# Patient Record
Sex: Female | Born: 1950
Health system: Southern US, Community
[De-identification: ages and names within clinical notes are randomized; demographics above are authoritative.]

## PROBLEM LIST (undated history)

## (undated) DIAGNOSIS — I509 Heart failure, unspecified: Secondary | ICD-10-CM

## (undated) DIAGNOSIS — H409 Unspecified glaucoma: Secondary | ICD-10-CM

## (undated) DIAGNOSIS — Z5189 Encounter for other specified aftercare: Secondary | ICD-10-CM

## (undated) DIAGNOSIS — D649 Anemia, unspecified: Secondary | ICD-10-CM

## (undated) DIAGNOSIS — E669 Obesity, unspecified: Secondary | ICD-10-CM

## (undated) DIAGNOSIS — R75 Inconclusive laboratory evidence of human immunodeficiency virus [HIV]: Secondary | ICD-10-CM

## (undated) DIAGNOSIS — I519 Heart disease, unspecified: Secondary | ICD-10-CM

## (undated) DIAGNOSIS — F329 Major depressive disorder, single episode, unspecified: Secondary | ICD-10-CM

## (undated) DIAGNOSIS — E785 Hyperlipidemia, unspecified: Secondary | ICD-10-CM

## (undated) DIAGNOSIS — K760 Fatty (change of) liver, not elsewhere classified: Secondary | ICD-10-CM

## (undated) DIAGNOSIS — F32A Depression, unspecified: Secondary | ICD-10-CM

## (undated) DIAGNOSIS — IMO0001 Reserved for inherently not codable concepts without codable children: Secondary | ICD-10-CM

## (undated) DIAGNOSIS — C50919 Malignant neoplasm of unspecified site of unspecified female breast: Secondary | ICD-10-CM

## (undated) DIAGNOSIS — K219 Gastro-esophageal reflux disease without esophagitis: Secondary | ICD-10-CM

## (undated) DIAGNOSIS — M1990 Unspecified osteoarthritis, unspecified site: Secondary | ICD-10-CM

## (undated) DIAGNOSIS — IMO0002 Reserved for concepts with insufficient information to code with codable children: Secondary | ICD-10-CM

## (undated) DIAGNOSIS — M797 Fibromyalgia: Secondary | ICD-10-CM

## (undated) DIAGNOSIS — Z87442 Personal history of urinary calculi: Secondary | ICD-10-CM

## (undated) DIAGNOSIS — I10 Essential (primary) hypertension: Secondary | ICD-10-CM

## (undated) DIAGNOSIS — L309 Dermatitis, unspecified: Secondary | ICD-10-CM

## (undated) DIAGNOSIS — Z923 Personal history of irradiation: Secondary | ICD-10-CM

## (undated) DIAGNOSIS — C50511 Malignant neoplasm of lower-outer quadrant of right female breast: Principal | ICD-10-CM

## (undated) HISTORY — DX: Dermatitis, unspecified: L30.9

## (undated) HISTORY — DX: Essential (primary) hypertension: I10

## (undated) HISTORY — DX: Malignant neoplasm of lower-outer quadrant of right female breast: C50.511

## (undated) HISTORY — DX: Gastro-esophageal reflux disease without esophagitis: K21.9

## (undated) HISTORY — DX: Unspecified glaucoma: H40.9

## (undated) HISTORY — DX: Personal history of urinary calculi: Z87.442

## (undated) HISTORY — DX: Unspecified osteoarthritis, unspecified site: M19.90

## (undated) HISTORY — DX: Encounter for other specified aftercare: Z51.89

## (undated) HISTORY — PX: JOINT REPLACEMENT: SHX530

## (undated) HISTORY — DX: Hyperlipidemia, unspecified: E78.5

## (undated) HISTORY — DX: Fibromyalgia: M79.7

## (undated) HISTORY — DX: Depression, unspecified: F32.A

## (undated) HISTORY — PX: CYSTOSCOPY W/ LITHOLAPAXY / EHL: SUR377

## (undated) HISTORY — DX: Heart disease, unspecified: I51.9

## (undated) HISTORY — DX: Major depressive disorder, single episode, unspecified: F32.9

## (undated) HISTORY — PX: REPLACEMENT TOTAL KNEE BILATERAL: SUR1225

---

## 1998-04-17 ENCOUNTER — Encounter: Admission: RE | Admit: 1998-04-17 | Discharge: 1998-04-17 | Payer: Self-pay | Admitting: Family Medicine

## 1998-05-01 ENCOUNTER — Encounter: Admission: RE | Admit: 1998-05-01 | Discharge: 1998-05-01 | Payer: Self-pay | Admitting: Family Medicine

## 1998-05-22 ENCOUNTER — Encounter: Admission: RE | Admit: 1998-05-22 | Discharge: 1998-05-22 | Payer: Self-pay | Admitting: Family Medicine

## 1999-09-17 ENCOUNTER — Encounter: Admission: RE | Admit: 1999-09-17 | Discharge: 1999-09-17 | Payer: Self-pay | Admitting: Family Medicine

## 1999-09-24 ENCOUNTER — Encounter: Admission: RE | Admit: 1999-09-24 | Discharge: 1999-09-24 | Payer: Self-pay | Admitting: Family Medicine

## 1999-10-19 ENCOUNTER — Encounter: Admission: RE | Admit: 1999-10-19 | Discharge: 1999-10-19 | Payer: Self-pay | Admitting: Family Medicine

## 1999-11-19 ENCOUNTER — Encounter: Admission: RE | Admit: 1999-11-19 | Discharge: 1999-11-19 | Payer: Self-pay | Admitting: Family Medicine

## 2000-05-25 ENCOUNTER — Encounter: Admission: RE | Admit: 2000-05-25 | Discharge: 2000-05-25 | Payer: Self-pay | Admitting: Family Medicine

## 2000-05-27 ENCOUNTER — Encounter: Admission: RE | Admit: 2000-05-27 | Discharge: 2000-05-27 | Payer: Self-pay | Admitting: Family Medicine

## 2000-06-02 ENCOUNTER — Encounter: Admission: RE | Admit: 2000-06-02 | Discharge: 2000-06-02 | Payer: Self-pay | Admitting: Family Medicine

## 2000-07-12 ENCOUNTER — Encounter: Admission: RE | Admit: 2000-07-12 | Discharge: 2000-07-12 | Payer: Self-pay | Admitting: Family Medicine

## 2000-07-12 ENCOUNTER — Ambulatory Visit (HOSPITAL_COMMUNITY): Admission: RE | Admit: 2000-07-12 | Discharge: 2000-07-12 | Payer: Self-pay | Admitting: Family Medicine

## 2001-01-10 ENCOUNTER — Encounter: Admission: RE | Admit: 2001-01-10 | Discharge: 2001-01-10 | Payer: Self-pay | Admitting: Family Medicine

## 2001-01-16 ENCOUNTER — Encounter: Admission: RE | Admit: 2001-01-16 | Discharge: 2001-01-16 | Payer: Self-pay | Admitting: Family Medicine

## 2001-01-16 ENCOUNTER — Encounter: Payer: Self-pay | Admitting: Family Medicine

## 2001-01-19 ENCOUNTER — Encounter: Admission: RE | Admit: 2001-01-19 | Discharge: 2001-01-19 | Payer: Self-pay | Admitting: Family Medicine

## 2001-01-19 ENCOUNTER — Encounter: Payer: Self-pay | Admitting: Family Medicine

## 2001-02-02 ENCOUNTER — Encounter: Admission: RE | Admit: 2001-02-02 | Discharge: 2001-02-02 | Payer: Self-pay | Admitting: Family Medicine

## 2001-03-16 ENCOUNTER — Encounter: Admission: RE | Admit: 2001-03-16 | Discharge: 2001-03-16 | Payer: Self-pay | Admitting: Family Medicine

## 2001-03-29 ENCOUNTER — Ambulatory Visit (HOSPITAL_COMMUNITY): Admission: RE | Admit: 2001-03-29 | Discharge: 2001-03-29 | Payer: Self-pay | Admitting: *Deleted

## 2001-04-06 ENCOUNTER — Encounter: Admission: RE | Admit: 2001-04-06 | Discharge: 2001-04-06 | Payer: Self-pay | Admitting: Family Medicine

## 2001-05-18 ENCOUNTER — Encounter: Admission: RE | Admit: 2001-05-18 | Discharge: 2001-05-18 | Payer: Self-pay | Admitting: Family Medicine

## 2001-07-06 ENCOUNTER — Encounter: Admission: RE | Admit: 2001-07-06 | Discharge: 2001-07-06 | Payer: Self-pay | Admitting: Family Medicine

## 2001-07-06 ENCOUNTER — Encounter: Payer: Self-pay | Admitting: Family Medicine

## 2001-08-29 ENCOUNTER — Encounter: Admission: RE | Admit: 2001-08-29 | Discharge: 2001-08-29 | Payer: Self-pay | Admitting: Family Medicine

## 2001-09-20 ENCOUNTER — Inpatient Hospital Stay (HOSPITAL_COMMUNITY): Admission: EM | Admit: 2001-09-20 | Discharge: 2001-09-21 | Payer: Self-pay | Admitting: *Deleted

## 2001-11-29 HISTORY — PX: CHOLECYSTECTOMY: SHX55

## 2002-01-25 ENCOUNTER — Encounter: Admission: RE | Admit: 2002-01-25 | Discharge: 2002-01-25 | Payer: Self-pay | Admitting: Family Medicine

## 2002-02-20 ENCOUNTER — Ambulatory Visit (HOSPITAL_COMMUNITY): Admission: RE | Admit: 2002-02-20 | Discharge: 2002-02-20 | Payer: Self-pay | Admitting: Sports Medicine

## 2002-02-20 ENCOUNTER — Encounter: Admission: RE | Admit: 2002-02-20 | Discharge: 2002-02-20 | Payer: Self-pay | Admitting: Sports Medicine

## 2002-03-09 ENCOUNTER — Ambulatory Visit (HOSPITAL_COMMUNITY): Admission: RE | Admit: 2002-03-09 | Discharge: 2002-03-09 | Payer: Self-pay | Admitting: Sports Medicine

## 2002-03-13 ENCOUNTER — Encounter: Admission: RE | Admit: 2002-03-13 | Discharge: 2002-03-13 | Payer: Self-pay | Admitting: Family Medicine

## 2002-05-08 ENCOUNTER — Encounter: Admission: RE | Admit: 2002-05-08 | Discharge: 2002-05-08 | Payer: Self-pay | Admitting: Sports Medicine

## 2002-05-08 ENCOUNTER — Encounter: Admission: RE | Admit: 2002-05-08 | Discharge: 2002-05-08 | Payer: Self-pay | Admitting: Family Medicine

## 2002-05-08 ENCOUNTER — Encounter: Payer: Self-pay | Admitting: Sports Medicine

## 2002-05-09 ENCOUNTER — Encounter (INDEPENDENT_AMBULATORY_CARE_PROVIDER_SITE_OTHER): Payer: Self-pay | Admitting: Specialist

## 2002-05-09 ENCOUNTER — Encounter: Payer: Self-pay | Admitting: General Surgery

## 2002-05-10 ENCOUNTER — Inpatient Hospital Stay (HOSPITAL_COMMUNITY): Admission: EM | Admit: 2002-05-10 | Discharge: 2002-05-11 | Payer: Self-pay | Admitting: Emergency Medicine

## 2002-06-05 ENCOUNTER — Emergency Department (HOSPITAL_COMMUNITY): Admission: EM | Admit: 2002-06-05 | Discharge: 2002-06-06 | Payer: Self-pay | Admitting: Emergency Medicine

## 2002-06-20 ENCOUNTER — Emergency Department (HOSPITAL_COMMUNITY): Admission: EM | Admit: 2002-06-20 | Discharge: 2002-06-20 | Payer: Self-pay

## 2002-06-26 ENCOUNTER — Encounter: Admission: RE | Admit: 2002-06-26 | Discharge: 2002-06-26 | Payer: Self-pay | Admitting: Family Medicine

## 2002-06-27 ENCOUNTER — Encounter: Payer: Self-pay | Admitting: Family Medicine

## 2002-06-27 ENCOUNTER — Encounter: Admission: RE | Admit: 2002-06-27 | Discharge: 2002-06-27 | Payer: Self-pay | Admitting: Family Medicine

## 2002-08-05 ENCOUNTER — Emergency Department (HOSPITAL_COMMUNITY): Admission: EM | Admit: 2002-08-05 | Discharge: 2002-08-05 | Payer: Self-pay | Admitting: Emergency Medicine

## 2002-08-05 ENCOUNTER — Encounter: Payer: Self-pay | Admitting: Emergency Medicine

## 2002-09-13 ENCOUNTER — Encounter: Admission: RE | Admit: 2002-09-13 | Discharge: 2002-09-13 | Payer: Self-pay | Admitting: Family Medicine

## 2002-09-17 ENCOUNTER — Ambulatory Visit (HOSPITAL_COMMUNITY): Admission: RE | Admit: 2002-09-17 | Discharge: 2002-09-17 | Payer: Self-pay | Admitting: Family Medicine

## 2002-10-01 ENCOUNTER — Encounter: Admission: RE | Admit: 2002-10-01 | Discharge: 2002-10-01 | Payer: Self-pay | Admitting: Family Medicine

## 2002-10-16 ENCOUNTER — Encounter: Admission: RE | Admit: 2002-10-16 | Discharge: 2002-10-16 | Payer: Self-pay | Admitting: Sports Medicine

## 2003-04-02 ENCOUNTER — Encounter: Admission: RE | Admit: 2003-04-02 | Discharge: 2003-04-02 | Payer: Self-pay | Admitting: Family Medicine

## 2003-07-23 ENCOUNTER — Encounter: Admission: RE | Admit: 2003-07-23 | Discharge: 2003-07-23 | Payer: Self-pay | Admitting: Family Medicine

## 2003-08-19 ENCOUNTER — Encounter: Payer: Self-pay | Admitting: Sports Medicine

## 2003-08-19 ENCOUNTER — Encounter: Admission: RE | Admit: 2003-08-19 | Discharge: 2003-08-19 | Payer: Self-pay | Admitting: Sports Medicine

## 2003-09-05 ENCOUNTER — Encounter: Admission: RE | Admit: 2003-09-05 | Discharge: 2003-09-05 | Payer: Self-pay | Admitting: Family Medicine

## 2004-01-07 ENCOUNTER — Encounter: Admission: RE | Admit: 2004-01-07 | Discharge: 2004-01-07 | Payer: Self-pay | Admitting: Family Medicine

## 2004-01-10 ENCOUNTER — Encounter: Admission: RE | Admit: 2004-01-10 | Discharge: 2004-01-10 | Payer: Self-pay | Admitting: Family Medicine

## 2004-01-11 ENCOUNTER — Emergency Department (HOSPITAL_COMMUNITY): Admission: EM | Admit: 2004-01-11 | Discharge: 2004-01-12 | Payer: Self-pay | Admitting: Emergency Medicine

## 2004-02-04 ENCOUNTER — Encounter: Admission: RE | Admit: 2004-02-04 | Discharge: 2004-02-04 | Payer: Self-pay | Admitting: Family Medicine

## 2004-03-13 ENCOUNTER — Ambulatory Visit (HOSPITAL_COMMUNITY): Admission: RE | Admit: 2004-03-13 | Discharge: 2004-03-13 | Payer: Self-pay | Admitting: Orthopedic Surgery

## 2004-03-14 ENCOUNTER — Observation Stay (HOSPITAL_COMMUNITY): Admission: EM | Admit: 2004-03-14 | Discharge: 2004-03-15 | Payer: Self-pay | Admitting: Emergency Medicine

## 2004-03-23 ENCOUNTER — Ambulatory Visit (HOSPITAL_COMMUNITY): Admission: RE | Admit: 2004-03-23 | Discharge: 2004-03-23 | Payer: Self-pay | Admitting: Urology

## 2004-03-26 ENCOUNTER — Encounter: Admission: RE | Admit: 2004-03-26 | Discharge: 2004-03-26 | Payer: Self-pay | Admitting: Sports Medicine

## 2004-06-03 ENCOUNTER — Ambulatory Visit (HOSPITAL_COMMUNITY): Admission: RE | Admit: 2004-06-03 | Discharge: 2004-06-03 | Payer: Self-pay | Admitting: Urology

## 2004-06-03 ENCOUNTER — Ambulatory Visit (HOSPITAL_BASED_OUTPATIENT_CLINIC_OR_DEPARTMENT_OTHER): Admission: RE | Admit: 2004-06-03 | Discharge: 2004-06-03 | Payer: Self-pay | Admitting: Urology

## 2004-07-22 ENCOUNTER — Inpatient Hospital Stay (HOSPITAL_COMMUNITY): Admission: RE | Admit: 2004-07-22 | Discharge: 2004-07-24 | Payer: Self-pay | Admitting: Orthopedic Surgery

## 2004-07-24 ENCOUNTER — Inpatient Hospital Stay (HOSPITAL_COMMUNITY)
Admission: RE | Admit: 2004-07-24 | Discharge: 2004-07-31 | Payer: Self-pay | Admitting: Physical Medicine & Rehabilitation

## 2004-07-24 ENCOUNTER — Ambulatory Visit: Payer: Self-pay | Admitting: Physical Medicine & Rehabilitation

## 2004-09-03 ENCOUNTER — Encounter: Admission: RE | Admit: 2004-09-03 | Discharge: 2004-10-28 | Payer: Self-pay | Admitting: Orthopedic Surgery

## 2004-09-15 ENCOUNTER — Ambulatory Visit: Payer: Self-pay | Admitting: Family Medicine

## 2004-10-05 ENCOUNTER — Encounter: Admission: RE | Admit: 2004-10-05 | Discharge: 2004-10-05 | Payer: Self-pay | Admitting: Family Medicine

## 2004-10-13 ENCOUNTER — Ambulatory Visit: Payer: Self-pay | Admitting: Family Medicine

## 2004-10-16 ENCOUNTER — Encounter: Admission: RE | Admit: 2004-10-16 | Discharge: 2004-10-16 | Payer: Self-pay | Admitting: Family Medicine

## 2004-10-20 ENCOUNTER — Ambulatory Visit: Payer: Self-pay | Admitting: Family Medicine

## 2005-03-11 ENCOUNTER — Ambulatory Visit (HOSPITAL_COMMUNITY): Admission: RE | Admit: 2005-03-11 | Discharge: 2005-03-11 | Payer: Self-pay | Admitting: Orthopedic Surgery

## 2005-04-03 ENCOUNTER — Emergency Department (HOSPITAL_COMMUNITY): Admission: EM | Admit: 2005-04-03 | Discharge: 2005-04-03 | Payer: Self-pay | Admitting: Emergency Medicine

## 2005-05-06 ENCOUNTER — Ambulatory Visit: Payer: Self-pay | Admitting: Family Medicine

## 2005-05-10 ENCOUNTER — Ambulatory Visit: Payer: Self-pay | Admitting: Internal Medicine

## 2005-05-10 ENCOUNTER — Inpatient Hospital Stay (HOSPITAL_COMMUNITY): Admission: RE | Admit: 2005-05-10 | Discharge: 2005-05-13 | Payer: Self-pay | Admitting: Orthopedic Surgery

## 2005-05-13 ENCOUNTER — Inpatient Hospital Stay
Admission: RE | Admit: 2005-05-13 | Discharge: 2005-05-20 | Payer: Self-pay | Admitting: Physical Medicine & Rehabilitation

## 2005-05-13 ENCOUNTER — Ambulatory Visit: Payer: Self-pay | Admitting: Physical Medicine & Rehabilitation

## 2005-06-18 ENCOUNTER — Encounter: Admission: RE | Admit: 2005-06-18 | Discharge: 2005-09-02 | Payer: Self-pay | Admitting: Orthopedic Surgery

## 2005-07-06 ENCOUNTER — Ambulatory Visit: Payer: Self-pay | Admitting: Family Medicine

## 2005-07-28 ENCOUNTER — Ambulatory Visit: Payer: Self-pay | Admitting: Cardiology

## 2005-07-28 ENCOUNTER — Encounter: Payer: Self-pay | Admitting: Cardiology

## 2005-07-28 ENCOUNTER — Ambulatory Visit (HOSPITAL_COMMUNITY): Admission: RE | Admit: 2005-07-28 | Discharge: 2005-07-28 | Payer: Self-pay | Admitting: Family Medicine

## 2005-08-10 ENCOUNTER — Ambulatory Visit: Payer: Self-pay | Admitting: Family Medicine

## 2005-11-04 ENCOUNTER — Encounter: Admission: RE | Admit: 2005-11-04 | Discharge: 2005-11-04 | Payer: Self-pay | Admitting: Family Medicine

## 2006-03-10 ENCOUNTER — Ambulatory Visit: Payer: Self-pay | Admitting: Family Medicine

## 2006-03-29 ENCOUNTER — Ambulatory Visit: Payer: Self-pay | Admitting: Family Medicine

## 2006-04-07 ENCOUNTER — Ambulatory Visit: Payer: Self-pay | Admitting: Family Medicine

## 2006-05-05 ENCOUNTER — Ambulatory Visit: Payer: Self-pay | Admitting: Family Medicine

## 2006-11-07 ENCOUNTER — Encounter: Admission: RE | Admit: 2006-11-07 | Discharge: 2006-11-07 | Payer: Self-pay | Admitting: Family Medicine

## 2006-12-30 ENCOUNTER — Encounter (INDEPENDENT_AMBULATORY_CARE_PROVIDER_SITE_OTHER): Payer: Self-pay | Admitting: *Deleted

## 2006-12-30 LAB — CONVERTED CEMR LAB

## 2007-01-04 ENCOUNTER — Ambulatory Visit (HOSPITAL_COMMUNITY): Admission: RE | Admit: 2007-01-04 | Discharge: 2007-01-04 | Payer: Self-pay | Admitting: Urology

## 2007-01-05 ENCOUNTER — Ambulatory Visit: Payer: Self-pay | Admitting: Family Medicine

## 2007-01-05 ENCOUNTER — Encounter: Payer: Self-pay | Admitting: Family Medicine

## 2007-01-26 DIAGNOSIS — I10 Essential (primary) hypertension: Secondary | ICD-10-CM | POA: Insufficient documentation

## 2007-01-26 DIAGNOSIS — D649 Anemia, unspecified: Secondary | ICD-10-CM

## 2007-01-26 DIAGNOSIS — M159 Polyosteoarthritis, unspecified: Secondary | ICD-10-CM | POA: Insufficient documentation

## 2007-01-26 DIAGNOSIS — N200 Calculus of kidney: Secondary | ICD-10-CM | POA: Insufficient documentation

## 2007-01-27 ENCOUNTER — Encounter (INDEPENDENT_AMBULATORY_CARE_PROVIDER_SITE_OTHER): Payer: Self-pay | Admitting: *Deleted

## 2007-05-18 ENCOUNTER — Ambulatory Visit: Payer: Self-pay | Admitting: Family Medicine

## 2007-05-18 DIAGNOSIS — E119 Type 2 diabetes mellitus without complications: Secondary | ICD-10-CM

## 2007-05-18 DIAGNOSIS — E1340 Other specified diabetes mellitus with diabetic neuropathy, unspecified: Secondary | ICD-10-CM | POA: Insufficient documentation

## 2007-05-18 DIAGNOSIS — F329 Major depressive disorder, single episode, unspecified: Secondary | ICD-10-CM | POA: Insufficient documentation

## 2007-05-18 LAB — CONVERTED CEMR LAB
BUN: 21 mg/dL (ref 6–23)
CO2: 28 meq/L (ref 19–32)
Calcium: 9.2 mg/dL (ref 8.4–10.5)
Chloride: 102 meq/L (ref 96–112)
Cholesterol: 175 mg/dL (ref 0–200)
Creatinine, Ser: 0.85 mg/dL (ref 0.40–1.20)
Glucose, Bld: 114 mg/dL — ABNORMAL HIGH (ref 70–99)
HDL: 51 mg/dL (ref >39)
LDL Cholesterol: 99 mg/dL (ref 0–99)
Potassium: 4.3 meq/L (ref 3.5–5.3)
Sodium: 141 meq/L (ref 135–145)
Total CHOL/HDL Ratio: 3.4
Triglycerides: 127 mg/dL (ref <150)
VLDL: 25 mg/dL (ref 0–40)

## 2007-05-19 ENCOUNTER — Encounter: Payer: Self-pay | Admitting: Family Medicine

## 2007-08-18 ENCOUNTER — Encounter: Payer: Self-pay | Admitting: Family Medicine

## 2007-08-24 ENCOUNTER — Encounter: Payer: Self-pay | Admitting: Family Medicine

## 2007-11-21 ENCOUNTER — Encounter: Admission: RE | Admit: 2007-11-21 | Discharge: 2007-11-21 | Payer: Self-pay | Admitting: Family Medicine

## 2008-02-08 ENCOUNTER — Ambulatory Visit: Payer: Self-pay | Admitting: Family Medicine

## 2008-02-08 DIAGNOSIS — J4520 Mild intermittent asthma, uncomplicated: Secondary | ICD-10-CM

## 2008-02-08 LAB — CONVERTED CEMR LAB
BUN: 17 mg/dL (ref 6–23)
Calcium: 9.3 mg/dL (ref 8.4–10.5)
Glucose, Bld: 136 mg/dL — ABNORMAL HIGH (ref 70–99)
Sodium: 142 meq/L (ref 135–145)

## 2008-02-09 ENCOUNTER — Encounter: Payer: Self-pay | Admitting: Family Medicine

## 2008-02-13 ENCOUNTER — Telehealth: Payer: Self-pay | Admitting: *Deleted

## 2008-02-20 ENCOUNTER — Encounter: Payer: Self-pay | Admitting: Family Medicine

## 2008-04-23 ENCOUNTER — Encounter (INDEPENDENT_AMBULATORY_CARE_PROVIDER_SITE_OTHER): Payer: Self-pay | Admitting: *Deleted

## 2008-04-23 ENCOUNTER — Ambulatory Visit: Payer: Self-pay | Admitting: Family Medicine

## 2008-04-30 ENCOUNTER — Ambulatory Visit: Payer: Self-pay | Admitting: Family Medicine

## 2008-05-09 ENCOUNTER — Ambulatory Visit: Payer: Self-pay | Admitting: Family Medicine

## 2008-05-09 LAB — CONVERTED CEMR LAB: Hgb A1c MFr Bld: 7.6 %

## 2008-10-28 ENCOUNTER — Ambulatory Visit: Payer: Self-pay | Admitting: Family Medicine

## 2008-10-28 LAB — CONVERTED CEMR LAB: Hgb A1c MFr Bld: 8.5 %

## 2008-10-30 ENCOUNTER — Telehealth: Payer: Self-pay | Admitting: *Deleted

## 2008-11-13 ENCOUNTER — Encounter: Payer: Self-pay | Admitting: Family Medicine

## 2008-11-13 ENCOUNTER — Encounter: Admission: RE | Admit: 2008-11-13 | Discharge: 2009-01-09 | Payer: Self-pay | Admitting: Family Medicine

## 2008-11-13 ENCOUNTER — Telehealth: Payer: Self-pay | Admitting: Family Medicine

## 2008-11-25 ENCOUNTER — Encounter: Admission: RE | Admit: 2008-11-25 | Discharge: 2008-11-25 | Payer: Self-pay | Admitting: Family Medicine

## 2008-12-05 ENCOUNTER — Telehealth (INDEPENDENT_AMBULATORY_CARE_PROVIDER_SITE_OTHER): Payer: Self-pay | Admitting: Family Medicine

## 2008-12-05 ENCOUNTER — Encounter: Admission: RE | Admit: 2008-12-05 | Discharge: 2008-12-05 | Payer: Self-pay

## 2008-12-17 ENCOUNTER — Telehealth: Payer: Self-pay | Admitting: Family Medicine

## 2009-01-23 ENCOUNTER — Ambulatory Visit: Payer: Self-pay | Admitting: Family Medicine

## 2009-01-23 LAB — CONVERTED CEMR LAB
Alkaline Phosphatase: 72 units/L (ref 39–117)
BUN: 21 mg/dL (ref 6–23)
Creatinine, Ser: 0.85 mg/dL (ref 0.40–1.20)
Glucose, Bld: 115 mg/dL — ABNORMAL HIGH (ref 70–99)
HDL: 48 mg/dL (ref >39)
LDL Cholesterol: 88 mg/dL (ref 0–99)
Total Bilirubin: 0.4 mg/dL (ref 0.3–1.2)
Triglycerides: 224 mg/dL — ABNORMAL HIGH (ref <150)
VLDL: 45 mg/dL — ABNORMAL HIGH (ref 0–40)

## 2009-01-24 ENCOUNTER — Encounter: Payer: Self-pay | Admitting: Family Medicine

## 2009-02-03 ENCOUNTER — Ambulatory Visit: Payer: Self-pay | Admitting: Family Medicine

## 2009-02-03 ENCOUNTER — Encounter: Payer: Self-pay | Admitting: Family Medicine

## 2009-02-03 ENCOUNTER — Telehealth: Payer: Self-pay | Admitting: Family Medicine

## 2009-02-04 ENCOUNTER — Encounter: Payer: Self-pay | Admitting: Family Medicine

## 2009-02-05 ENCOUNTER — Ambulatory Visit: Payer: Self-pay | Admitting: Cardiology

## 2009-02-05 ENCOUNTER — Encounter: Payer: Self-pay | Admitting: Cardiology

## 2009-02-05 DIAGNOSIS — E785 Hyperlipidemia, unspecified: Secondary | ICD-10-CM | POA: Insufficient documentation

## 2009-02-05 DIAGNOSIS — E669 Obesity, unspecified: Secondary | ICD-10-CM | POA: Insufficient documentation

## 2009-02-06 ENCOUNTER — Ambulatory Visit: Payer: Self-pay | Admitting: Family Medicine

## 2009-02-17 ENCOUNTER — Ambulatory Visit: Payer: Self-pay

## 2009-02-25 ENCOUNTER — Encounter: Payer: Self-pay | Admitting: Family Medicine

## 2009-04-03 ENCOUNTER — Ambulatory Visit: Payer: Self-pay | Admitting: Family Medicine

## 2009-07-14 ENCOUNTER — Ambulatory Visit: Payer: Self-pay | Admitting: Family Medicine

## 2009-07-15 ENCOUNTER — Encounter: Payer: Self-pay | Admitting: Family Medicine

## 2009-09-12 ENCOUNTER — Ambulatory Visit: Payer: Self-pay | Admitting: Family Medicine

## 2009-10-13 ENCOUNTER — Ambulatory Visit: Payer: Self-pay | Admitting: Family Medicine

## 2009-12-17 ENCOUNTER — Encounter: Admission: RE | Admit: 2009-12-17 | Discharge: 2009-12-17 | Payer: Self-pay | Admitting: Family Medicine

## 2010-02-05 ENCOUNTER — Ambulatory Visit: Payer: Self-pay | Admitting: Family Medicine

## 2010-03-05 ENCOUNTER — Encounter: Payer: Self-pay | Admitting: Family Medicine

## 2010-03-06 ENCOUNTER — Encounter: Payer: Self-pay | Admitting: Family Medicine

## 2010-04-29 ENCOUNTER — Ambulatory Visit: Payer: Self-pay | Admitting: Family Medicine

## 2010-04-29 LAB — CONVERTED CEMR LAB
ALT: 10 units/L (ref 0–35)
AST: 20 units/L (ref 0–37)
BUN: 20 mg/dL (ref 6–23)
CO2: 26 meq/L (ref 19–32)
Cholesterol: 190 mg/dL (ref 0–200)
Creatinine, Ser: 0.8 mg/dL (ref 0.40–1.20)
HCT: 36.1 % (ref 36.0–46.0)
HDL: 47 mg/dL (ref >39)
MCV: 87.4 fL (ref 78.0–100.0)
Platelets: 281 10*3/uL (ref 150–400)
RDW: 15 % (ref 11.5–15.5)
Total Bilirubin: 0.4 mg/dL (ref 0.3–1.2)
Total CHOL/HDL Ratio: 4
VLDL: 61 mg/dL — ABNORMAL HIGH (ref 0–40)

## 2010-05-01 ENCOUNTER — Encounter: Payer: Self-pay | Admitting: Family Medicine

## 2010-05-28 ENCOUNTER — Telehealth: Payer: Self-pay | Admitting: Family Medicine

## 2010-09-03 ENCOUNTER — Encounter: Payer: Self-pay | Admitting: Family Medicine

## 2010-09-03 ENCOUNTER — Encounter: Payer: Self-pay | Admitting: *Deleted

## 2010-09-14 ENCOUNTER — Encounter: Admission: RE | Admit: 2010-09-14 | Discharge: 2010-09-14 | Payer: Self-pay

## 2010-11-04 ENCOUNTER — Ambulatory Visit: Payer: Self-pay | Admitting: Family Medicine

## 2010-11-04 LAB — CONVERTED CEMR LAB: Hgb A1c MFr Bld: 6.5 %

## 2010-12-14 ENCOUNTER — Ambulatory Visit
Admission: RE | Admit: 2010-12-14 | Discharge: 2010-12-14 | Payer: Self-pay | Source: Home / Self Care | Attending: Family Medicine | Admitting: Family Medicine

## 2010-12-14 DIAGNOSIS — M21619 Bunion of unspecified foot: Secondary | ICD-10-CM | POA: Insufficient documentation

## 2010-12-18 ENCOUNTER — Encounter
Admission: RE | Admit: 2010-12-18 | Discharge: 2010-12-18 | Payer: Self-pay | Source: Home / Self Care | Attending: Family Medicine | Admitting: Family Medicine

## 2010-12-19 ENCOUNTER — Encounter: Payer: Self-pay | Admitting: Family Medicine

## 2010-12-29 NOTE — Miscellaneous (Signed)
  Medications Added MAXZIDE-25 37.5-25 MG TABS (TRIAMTERENE-HCTZ) 1 daily       Clinical Lists Changes  Medications: Changed medication from DYAZIDE 37.5-25 MG CAPS (TRIAMTERENE-HCTZ) Take 1 capsule by mouth once a day to MAXZIDE-25 37.5-25 MG TABS (TRIAMTERENE-HCTZ) 1 daily - Signed Rx of MAXZIDE-25 37.5-25 MG TABS (TRIAMTERENE-HCTZ) 1 daily;  #30 x 6;  Signed;  Entered by: Pearlean Brownie MD;  Authorized by: Pearlean Brownie MD;  Method used: Electronically to [id number]*, [address], [place], [place], [place]  [address], Ph: [phone] or [phone], Fax: [phone]    Prescriptions: MAXZIDE-25 37.5-25 MG TABS (TRIAMTERENE-HCTZ) 1 daily  #30 x 6   Entered and Authorized by:   Pearlean Brownie MD   Signed by:   Pearlean Brownie MD on 03/06/2010   Method used:   Electronically to        [facility] [phone]* (retail)       [address]       [place], [place]  [address]       Ph: [phone] or [phone]       Fax: [phone]   RxID:   [id number]

## 2010-12-29 NOTE — Assessment & Plan Note (Signed)
Summary: 3 mo f/u,df   Vital Signs:  Patient profile:   60 year old female Height:      62 inches Weight:      239.8 pounds BMI:     44.02 Temp:     98.0 degrees F oral Pulse rate:   97 / minute BP sitting:   126 / 80  (right arm) Cuff size:   large  Vitals Entered By: Garen Grams LPN (February 05, 2010 9:51 AM) CC: f/u dm Is Patient Diabetic? Yes Did you bring your meter with you today? Yes Pain Assessment Patient in pain? no        Primary Care Provider:  Pearlean Brownie MD  CC:  f/u dm.  History of Present Illness: HYPERTENSION Disease Monitoring Blood pressure range:does not check usually   Chest pain:N   Dyspnea:N Medications Compliance:daily   Lightheadedness:N   Edema:N   DIABETES Disease Monitoring Blood Sugar ranges:usually < 130   Polyuria:N   Visual problems:N Medications Compliance:daily   Hypoglycemic symptoms:N  Asthma using albuterol occaisional and flovent every PM.  No exacerbations over the winter.  No nocturnal cough  Obesity Has been under stress lately and thinks is eating more.  Gained 2 lbs.  Exercising once a week with church.  Still has knee pain and now also some carpal tunnel symptoms both worsened by obesity  ROS - as above PMH - Medications reviewed and updated in medication list.  Smoking Status noted in VS form        Habits & Providers  Alcohol-Tobacco-Diet     Tobacco Status: never  Current Medications (verified): 1)  Albuterol 90 Mcg/act Aers (Albuterol) .Marland Kitchen.. 1-2 Puff Qid As Needed 2)  Desipramine Hcl 100 Mg Tabs (Desipramine Hcl) .... Take 1 Tablet By Mouth Every Night 3)  Dyazide 37.5-25 Mg Caps (Triamterene-Hctz) .... Take 1 Capsule By Mouth Once A Day 4)  Flovent Hfa 110 Mcg/act Aero (Fluticasone Propionate  Hfa) .... Inhale 2 Puff Using Inhaler Twice A Day 5)  Procardia Xl 90 Mg Tb24 (Nifedipine) .... Take 1 Tablet By Mouth Every Morning 6)  Zantac 150 Mg Tabs (Ranitidine Hcl) .... Take 1 Tablet By Mouth Once  A Day 7)  Urocit-K 10 1080 Mg Cr-Tabs (Potassium Citrate) .Marland Kitchen.. 1 Daily 8)  Metformin Hcl 500 Mg Tabs (Metformin Hcl) .... Take 1 Tab Twice Daily 9)  Easy Touch Lancets  Misc (Lancets) .... As Advised 10)  Bl Blood Glucose Monitor Kit  Kit (Blood Glucose Monitoring Suppl) .... As Advised 11)  Onetouch Ultra Test  Strp (Glucose Blood) .... Test Blood Glucose Once Daily 12)  Gabapentin 300 Mg  Caps (Gabapentin) .Marland Kitchen.. 1 By Mouth Three Times A Day Dr Ralene Cork  Allergies: No Known Drug Allergies  Social History: Disability 2nd to [employer]; Lives with her husband a double amputee - stressful relationship Tobacco Use - No.  Alcohol Use -quit 9 years ago  Physical Exam  General:  obese no acute distress  Lungs:  Normal respiratory effort, chest expands symmetrically. Lungs are clear to auscultation, no crackles or wheezes. Heart:  Normal rate and regular rhythm. S1 and S2 normal without gallop, murmur, click, rub or other extra sounds. Extremities:  no weakness of hands or loss of sensation   Impression & Recommendations:  Problem # 1:  DIABETES MELLITUS (ICD-250.00) good control  Her updated medication list for this problem includes:    Metformin Hcl 500 Mg Tabs (Metformin hcl) .Marland Kitchen... Take 1 tab twice daily  Orders: A1C-[facility] (04540)  [facility]- Est  Level 4 (99214)  Labs Reviewed: Creat: 0.85 (01/23/2009)     Last Eye Exam: normal followd by Podiatry (01/08/2009) Reviewed HgBA1c results: 6.6 (02/05/2010)  6.4 (07/14/2009)  Problem # 2:  HYPERTENSION, BENIGN SYSTEMIC (ICD-401.1)  Good control  Her updated medication list for this problem includes:    Dyazide 37.5-25 Mg Caps (Triamterene-hctz) .Marland Kitchen... Take 1 capsule by mouth once a day    Procardia Xl 90 Mg Tb24 (Nifedipine) .Marland Kitchen... Take 1 tablet by mouth every morning  BP today: 126/80 Prior BP: 110/71 (10/13/2009)  Prior 10 Yr Risk Heart Disease: 8 % (02/05/2009)  Labs Reviewed: K+: 4.4 (01/23/2009) Creat: : 0.85 (01/23/2009)   Chol: 181  (01/23/2009)   HDL: 48 (01/23/2009)   LDL: 88 (01/23/2009)   TG: 224 (01/23/2009)  Orders: [facility]- Est  Level 4 (99214)  Problem # 3:  ASTHMA, PERSISTENT, MILD (ICD-493.90)  good control Her updated medication list for this problem includes:    Albuterol 90 Mcg/act Aers (Albuterol) .Marland Kitchen... 1-2 puff qid as needed    Flovent Hfa 110 Mcg/act Aero (Fluticasone propionate  hfa) ..... Inhale 2 puff using inhaler twice a day  Orders: [facility]- Est  Level 4 (69629)  Problem # 4:  OBESITY (ICD-278.00) Assessment: Deteriorated discussed approaches and that her carpal tunnel and knee pain both made worse with her weight   Complete Medication List: 1)  Albuterol 90 Mcg/act Aers (Albuterol) .Marland Kitchen.. 1-2 puff qid as needed 2)  Desipramine Hcl 100 Mg Tabs (Desipramine hcl) .... Take 1 tablet by mouth every night 3)  Dyazide 37.5-25 Mg Caps (Triamterene-hctz) .... Take 1 capsule by mouth once a day 4)  Flovent Hfa 110 Mcg/act Aero (Fluticasone propionate  hfa) .... Inhale 2 puff using inhaler twice a day 5)  Procardia Xl 90 Mg Tb24 (Nifedipine) .... Take 1 tablet by mouth every morning 6)  Zantac 150 Mg Tabs (Ranitidine hcl) .... Take 1 tablet by mouth once a day 7)  Urocit-k 10 1080 Mg Cr-tabs (Potassium citrate) .Marland Kitchen.. 1 daily 8)  Metformin Hcl 500 Mg Tabs (Metformin hcl) .... Take 1 tab twice daily 9)  Easy Touch Lancets Misc (Lancets) .... As advised 10)  Bl Blood Glucose Monitor Kit Kit (Blood glucose monitoring suppl) .... As advised 11)  Onetouch Ultra Test Strp (Glucose blood) .... Test blood glucose once daily 12)  Gabapentin 300 Mg Caps (Gabapentin) .Marland Kitchen.. 1 by mouth three times a day dr Ralene Cork  Patient Instructions: 1)  Come in for a Pap smear in 3 months 2)  Come in fasting next visit (NO FOOD FOR 8 HOURS) 3)  Look at your Shingles shot coverage (Zosatvax) 4)  Work on your weight - find a substitute for stress eating   Prevention & Chronic Care Immunizations   Influenza vaccine: Fluvax MCR   (09/12/2009)    Tetanus booster: 07/14/2009: Tdap    Pneumococcal vaccine: Not documented    H. zoster vaccine: Not documented  Colorectal Screening   Hemoccult: Done.  (03/30/2001)   Hemoccult due: Not Indicated    Colonoscopy: Done.  (03/29/2001)   Colonoscopy due: 03/30/2011  Other Screening   Pap smear: normal  (12/30/2006)   Pap smear action/deferral: Deferred-3 yr interval  (10/13/2009)   Pap smear due: 12/30/2009    Mammogram: ASSESSMENT: Negative - BI-RADS 1^MM DIGITAL SCREENING  (12/17/2009)   Mammogram due: 11/2008    DXA bone density scan: Not documented   Smoking status: never  (02/05/2010)  Diabetes Mellitus   HgbA1C: 6.6  (  02/05/2010)   Hemoglobin A1C due: 08/09/2008    Eye exam: normal followd by Podiatry  (01/08/2009)   Eye exam due: 01/08/2010    Foot exam: yes  (10/13/2009)   High risk foot: Not documented   Foot care education: Not documented   Foot exam due: 01/08/2010    Urine microalbumin/creatinine ratio: Not documented   Urine microalbumin action/deferral: Not indicated  Lipids   Total Cholesterol: 181  (01/23/2009)   LDL: 88  (01/23/2009)   LDL Direct: Not documented   HDL: 48  (01/23/2009)   Triglycerides: 224  (01/23/2009)    SGOT (AST): 26  (01/23/2009)   SGPT (ALT): 10  (01/23/2009)   Alkaline phosphatase: 72  (01/23/2009)   Total bilirubin: 0.4  (01/23/2009)    Lipid flowsheet reviewed?: Yes   Progress toward LDL goal: At goal  Hypertension   Last Blood Pressure: 126 / 80  (02/05/2010)   Serum creatinine: 0.85  (01/23/2009)   Serum potassium 4.4  (01/23/2009)    Hypertension flowsheet reviewed?: Yes   Progress toward BP goal: At goal  Self-Management Support :   Personal Goals (by the next clinic visit) :     Personal A1C goal: 7  (07/14/2009)     Personal blood pressure goal: 130/80  (07/14/2009)     Personal LDL goal: 100  (07/14/2009)    Diabetes self-management support: Written self-care plan  (07/14/2009)     Diabetes self-management support not done because: Good outcomes  (10/13/2009)    Hypertension self-management support: Written self-care plan  (07/14/2009)    Hypertension self-management support not done because: Good outcomes  (10/13/2009)    Lipid self-management support: Written self-care plan  (07/14/2009)     Lipid self-management support not done because: Good outcomes  (10/13/2009)   Laboratory Results   Blood Tests   Date/Time Received: February 05, 2010 9:57 AM  Date/Time Reported: February 05, 2010 10:11 AM   HGBA1C: 6.6%   (Normal Range: Non-Diabetic - 3-6%   Control Diabetic - 6-8%)  Comments: ...........test performed by..........Marland KitchenGaren Grams, LPN entered by Terese Door, CMA

## 2010-12-29 NOTE — Consult Note (Signed)
Summary: [facility]   Imported By: Clydell Hakim 03/25/2009 15:03:53  _____________________________________________________________________  External Attachment:    Type:   Image     Comment:   External Document

## 2010-12-29 NOTE — Assessment & Plan Note (Signed)
Summary: f/u,df   Vital Signs:  Patient profile:   60 year old female Height:      62 inches Weight:      250.6 pounds BMI:     46.00 Temp:     98.2 degrees F oral Pulse rate:   93 / minute BP sitting:   117 / 76  (right arm) Cuff size:   large  Vitals Entered By: Garen Grams LPN (November 04, 2010 10:49 AM) CC: f/u dm Is Patient Diabetic? Yes Did you bring your meter with you today? No Pain Assessment Patient in pain? yes     Location: [facility]   Primary Care Provider:  Pearlean Brownie MD  CC:  f/u dm.  History of Present Illness: HYPERTENSION Disease Monitoring Blood pressure range:not checking at home but has been controlled at her medical visits   Chest pain:N   Dyspnea:N Medications Compliance:dialy   Lightheadedness:N   Edema:N   DIABETES Disease Monitoring Blood Sugar ranges:no high readings   Polyuria:N   Visual problems:n Medications Compliance:daily   Hypoglycemic symptoms:N   HYPERLIPIDEMIA Disease Monitoring See symptoms for Hypertension Medications Compliance watching her diet:   RUQ pain:N   Muscle aches:N  Obesity has gained 12 lbs.  [facility] [place] illness and [facility].  Plans to restart and to limit her intake  ROS - as above PMH - Medications reviewed and updated in medication list.  Smoking Status noted in VS form     Habits & Providers  Alcohol-Tobacco-Diet     Tobacco Status: never  -  Date:  09/04/2010    Diabetes Eye Exam normal   Current Medications (verified): 1)  Albuterol 90 Mcg/act Aers (Albuterol) .Marland Kitchen.. 1-2 Puff Qid As Needed 2)  Desipramine Hcl 100 Mg Tabs (Desipramine Hcl) .... Take 1 Tablet By Mouth Every Night 3)  Triamterene-Hctz 75-50 Mg Tabs (Triamterene-Hctz) .... 1/2 Pill Daily 4)  Flovent Hfa 110 Mcg/act Aero (Fluticasone Propionate  Hfa) .... Inhale 2 Puff Using Inhaler Twice A Day 5)  Procardia Xl 90 Mg Tb24 (Nifedipine) .... Take 1 Tablet By Mouth Every Morning 6)   Zantac 150 Mg Tabs (Ranitidine Hcl) .... Take 1 Tablet By Mouth Once A Day 7)  Urocit-K 10 1080 Mg Cr-Tabs (Potassium Citrate) .Marland Kitchen.. 1 Daily 8)  Metformin Hcl 500 Mg Tabs (Metformin Hcl) .... Take 1 Tab Twice Daily 9)  Easy Touch Lancets  Misc (Lancets) .... As Advised 10)  Bl Blood Glucose Monitor Kit  Kit (Blood Glucose Monitoring Suppl) .... As Advised 11)  Onetouch Ultra Test  Strp (Glucose Blood) .... Test Blood Glucose Once Daily 12)  Gabapentin 300 Mg  Caps (Gabapentin) .Marland Kitchen.. 1 By Mouth Three Times A Day Dr Ralene Cork  Allergies: No Known Drug Allergies  Physical Exam  General:  Well-developed,well-nourished,in no acute distress; alert,appropriate and cooperative throughout examination  obese Lungs:  Normal respiratory effort, chest expands symmetrically. Lungs are clear to auscultation, no crackles or wheezes. Heart:  Normal rate and regular rhythm. S1 and S2 normal without gallop, murmur, click, rub or other extra sounds.   Impression & Recommendations:  Problem # 1:  HYPERTENSION, BENIGN SYSTEMIC (ICD-401.1)  Good control  Her updated medication list for this problem includes:    Triamterene-hctz 75-50 Mg Tabs (Triamterene-hctz) .Marland Kitchen... 1/2 pill daily    Procardia Xl 90 Mg Tb24 (Nifedipine) .Marland Kitchen... Take 1 tablet by mouth every morning  BP today: 117/76 Prior BP: 119/78 (04/29/2010)  Prior 10 Yr Risk  Heart Disease: 8 % (02/05/2009)  Labs Reviewed: K+: 4.2 (04/29/2010) Creat: : 0.80 (04/29/2010)   Chol: 190 (04/29/2010)   HDL: 47 (04/29/2010)   LDL: 82 (04/29/2010)   TG: 305 (04/29/2010)  Orders: [facility]- Est  Level 4 (54098)  Problem # 2:  DIABETES MELLITUS (ICD-250.00) Good control  Her updated medication list for this problem includes:    Metformin Hcl 500 Mg Tabs (Metformin hcl) .Marland Kitchen... Take 1 tab twice daily  Orders: A1C-[facility] (11914) [facility]- Est  Level 4 (78295)  Labs Reviewed: Creat: 0.80 (04/29/2010)     Last Eye Exam: normal followd by Podiatry (01/08/2009) Reviewed  HgBA1c results: 6.5 (11/04/2010)  6.4 (04/29/2010)  Problem # 3:  DYSLIPIDEMIA (ICD-272.4)  Good control  Labs Reviewed: SGOT: 20 (04/29/2010)   SGPT: 10 (04/29/2010)  Prior 10 Yr Risk Heart Disease: 8 % (02/05/2009)   HDL:47 (04/29/2010), 48 (01/23/2009)  LDL:82 (04/29/2010), 88 (01/23/2009)  Chol:190 (04/29/2010), 181 (01/23/2009)  Trig:305 (04/29/2010), 224 (01/23/2009)  Orders: [facility]- Est  Level 4 (62130)  Problem # 4:  OBESITY (ICD-278.00) worsening this is his major health issue  Complete Medication List: 1)  Albuterol 90 Mcg/act Aers (Albuterol) .Marland Kitchen.. 1-2 puff qid as needed 2)  Desipramine Hcl 100 Mg Tabs (Desipramine hcl) .... Take 1 tablet by mouth every night 3)  Triamterene-hctz 75-50 Mg Tabs (Triamterene-hctz) .... 1/2 pill daily 4)  Flovent Hfa 110 Mcg/act Aero (Fluticasone propionate  hfa) .... Inhale 2 puff using inhaler twice a day 5)  Procardia Xl 90 Mg Tb24 (Nifedipine) .... Take 1 tablet by mouth every morning 6)  Zantac 150 Mg Tabs (Ranitidine hcl) .... Take 1 tablet by mouth once a day 7)  Urocit-k 10 1080 Mg Cr-tabs (Potassium citrate) .Marland Kitchen.. 1 daily 8)  Metformin Hcl 500 Mg Tabs (Metformin hcl) .... Take 1 tab twice daily 9)  Easy Touch Lancets Misc (Lancets) .... As advised 10)  Bl Blood Glucose Monitor Kit Kit (Blood glucose monitoring suppl) .... As advised 11)  Onetouch Ultra Test Strp (Glucose blood) .... Test blood glucose once daily 12)  Gabapentin 300 Mg Caps (Gabapentin) .Marland Kitchen.. 1 by mouth three times a day dr Ralene Cork  Patient Instructions: 1)  Please schedule a follow-up appointment in 6 months .  2)  Check with your insurance about Zostavax  3)  Your task is to control your weight - we will have Health Educator who can help with this starting in Jan   Orders Added: 1)  A1C-[facility] [83036] 2)  [facility]- Est  Level 4 [99214]    Laboratory Results   Blood Tests   Date/Time Received: November 04, 2010 10:39 AM  Date/Time Reported: November 04, 2010 10:56  AM   HGBA1C: 6.5%   (Normal Range: Non-Diabetic - 3-6%   Control Diabetic - 6-8%)  Comments: ...............test performed by......Marland KitchenBonnie A. [place], MLS (ASCP)cm        Prevention & Chronic Care Immunizations   Influenza vaccine: Fluvax MCR  (09/12/2009)    Tetanus booster: 07/14/2009: Tdap    Pneumococcal vaccine: Not documented    H. zoster vaccine: Not documented  Colorectal Screening   Hemoccult: Done.  (03/30/2001)   Hemoccult due: Not Indicated    Colonoscopy: Done.  (03/29/2001)   Colonoscopy due: 03/30/2011  Other Screening   Pap smear: NEGATIVE FOR INTRAEPITHELIAL LESIONS OR MALIGNANCY.  (04/29/2010)   Pap smear action/deferral: Deferred-3 yr interval  (10/13/2009)   Pap smear due: 12/30/2009    Mammogram: ASSESSMENT: Negative - BI-RADS 1^MM DIGITAL SCREENING  (  12/17/2009)   Mammogram due: 11/2008    DXA bone density scan: Not documented   Smoking status: never  (11/04/2010)  Diabetes Mellitus   HgbA1C: 6.5  (11/04/2010)   Hemoglobin A1C due: 08/09/2008    Eye exam: normal   (09/04/2010)   Eye exam due: 01/08/2010    Foot exam: yes  (04/29/2010)   High risk foot: Not documented   Foot care education: Not documented   Foot exam due: 01/08/2010    Urine microalbumin/creatinine ratio: Not documented   Urine microalbumin action/deferral: Not indicated    Diabetes flowsheet reviewed?: Yes   Progress toward A1C goal: At goal  Lipids   Total Cholesterol: 190  (04/29/2010)   LDL: 82  (04/29/2010)   LDL Direct: Not documented   HDL: 47  (04/29/2010)   Triglycerides: 305  (04/29/2010)    SGOT (AST): 20  (04/29/2010)   SGPT (ALT): 10  (04/29/2010)   Alkaline phosphatase: 64  (04/29/2010)   Total bilirubin: 0.4  (04/29/2010)    Lipid flowsheet reviewed?: Yes   Progress toward LDL goal: At goal  Hypertension   Last Blood Pressure: 117 / 76  (11/04/2010)   Serum creatinine: 0.80  (04/29/2010)   Serum potassium 4.2  (04/29/2010)     Hypertension flowsheet reviewed?: Yes   Progress toward BP goal: At goal  Self-Management Support :   Personal Goals (by the next clinic visit) :     Personal A1C goal: 7  (07/14/2009)     Personal blood pressure goal: 130/80  (07/14/2009)     Personal LDL goal: 100  (07/14/2009)    Diabetes self-management support: Written self-care plan  (04/29/2010)    Diabetes self-management support not done because: Good outcomes  (10/13/2009)    Hypertension self-management support: Written self-care plan  (04/29/2010)    Hypertension self-management support not done because: Good outcomes  (11/04/2010)    Lipid self-management support: Written self-care plan  (04/29/2010)     Lipid self-management support not done because: Good outcomes  (11/04/2010)

## 2010-12-29 NOTE — Progress Notes (Signed)
Summary: refill  Medications Added TRIAMTERENE-HCTZ 75-50 MG TABS (TRIAMTERENE-HCTZ) 1/2 pill daily       Phone Note Refill Request Call back at Home Phone [phone] Message from:  Patient  Refills Requested: Medication #1:  MAXZIDE-25 37.5-25 MG TABS 1 daily   Notes: pharmacy states they don't have this and needs something different Initial call taken by: De Nurse,  May 28, 2010 2:47 PM  Follow-up for Phone Call        Pls check with pharmacy and see if they suggest alternatives - perhaps 1/2 of a 75-50 combination Maxzide? thanks  Follow-up by: Pearlean Brownie MD,  May 28, 2010 3:29 PM  Additional Follow-up for Phone Call Additional follow up Details #1::        They have the 75-50 combo and she sould take a half a pill. If this is ok pharmacist states just to send a new rx. [facility] Additional Follow-up by: Garen Grams LPN,  May 29, 3874 9:30 AM    New/Updated Medications: TRIAMTERENE-HCTZ 75-50 MG TABS (TRIAMTERENE-HCTZ) 1/2 pill daily Prescriptions: TRIAMTERENE-HCTZ 75-50 MG TABS (TRIAMTERENE-HCTZ) 1/2 pill daily  #30 x 6   Entered and Authorized by:   Pearlean Brownie MD   Signed by:   Pearlean Brownie MD on 05/29/2010   Method used:   Electronically to        [facility] [address] [phone]* (retail)       [address]       [place], [place]  [address]       Ph: [phone] or [phone]       Fax: [phone]   RxID:   [id number]

## 2010-12-29 NOTE — Assessment & Plan Note (Signed)
Summary: cpe/pap,tcb   Vital Signs:  Patient profile:   60 year old female Height:      62 inches Weight:      238.3 pounds BMI:     43.74 Temp:     97.9 degrees F oral Pulse rate:   104 / minute BP sitting:   119 / 78  (left arm) Cuff size:   regular  Vitals Entered By: Garen Grams LPN (April 30, 7563 11:43 AM) CC: f/u dm Is Patient Diabetic? Yes Did you bring your meter with you today? No Pain Assessment Patient in pain? no        Primary Care Provider:  Pearlean Brownie MD  CC:  f/u dm.  History of Present Illness: HYPERTENSION Disease Monitoring Blood pressure range:not checking   Chest pain:N   Dyspnea:N Medications Compliance:dialy   Lightheadedness:N   Edema:N   DIABETES Disease Monitoring Blood Sugar ranges:well controlled   Polyuria:N   Visual problems:n Medications Compliance:daily   Hypoglycemic symptoms:N   HYPERLIPIDEMIA Disease Monitoring See symptoms for Hypertension Medications Compliance watching her diet:   RUQ pain:N   Muscle aches:N  ROS - as above PMH - Medications reviewed and updated in medication list.  Smoking Status noted in VS form     Habits & Providers  Alcohol-Tobacco-Diet     Tobacco Status: never  Current Medications (verified): 1)  Albuterol 90 Mcg/act Aers (Albuterol) .Marland Kitchen.. 1-2 Puff Qid As Needed 2)  Desipramine Hcl 100 Mg Tabs (Desipramine Hcl) .... Take 1 Tablet By Mouth Every Night 3)  Maxzide-25 37.5-25 Mg Tabs (Triamterene-Hctz) .Marland Kitchen.. 1 Daily 4)  Flovent Hfa 110 Mcg/act Aero (Fluticasone Propionate  Hfa) .... Inhale 2 Puff Using Inhaler Twice A Day 5)  Procardia Xl 90 Mg Tb24 (Nifedipine) .... Take 1 Tablet By Mouth Every Morning 6)  Zantac 150 Mg Tabs (Ranitidine Hcl) .... Take 1 Tablet By Mouth Once A Day 7)  Urocit-K 10 1080 Mg Cr-Tabs (Potassium Citrate) .Marland Kitchen.. 1 Daily 8)  Metformin Hcl 500 Mg Tabs (Metformin Hcl) .... Take 1 Tab Twice Daily 9)  Easy Touch Lancets  Misc (Lancets) .... As Advised 10)  Bl  Blood Glucose Monitor Kit  Kit (Blood Glucose Monitoring Suppl) .... As Advised 11)  Onetouch Ultra Test  Strp (Glucose Blood) .... Test Blood Glucose Once Daily 12)  Gabapentin 300 Mg  Caps (Gabapentin) .Marland Kitchen.. 1 By Mouth Three Times A Day Dr Ralene Cork  Allergies: No Known Drug Allergies  Physical Exam  Genitalia:  Normal introitus for age, no external lesions, no vaginal discharge, mucosa pink and moist, no vaginal or cervical lesions, no vaginal atrophy, no friaility or hemorrhage, normal uterus size and position, no adnexal masses or tenderness  Diabetes Management Exam:    Foot Exam (with socks and/or shoes not present):       Sensory-Pinprick/Light touch:          Left medial foot (L-4): normal          Left dorsal foot (L-5): normal          Left lateral foot (S-1): normal          Right medial foot (L-4): normal          Right dorsal foot (L-5): normal          Right lateral foot (S-1): normal       Sensory-Monofilament:          Left foot: normal          Right foot:  normal       Inspection:          Left foot: normal          Right foot: normal       Nails:          Left foot: normal          Right foot: normal   Impression & Recommendations:  Problem # 1:  HYPERTENSION, BENIGN SYSTEMIC (ICD-401.1)  good control Her updated medication list for this problem includes:    Maxzide-25 37.5-25 Mg Tabs (Triamterene-hctz) .Marland Kitchen... 1 daily    Procardia Xl 90 Mg Tb24 (Nifedipine) .Marland Kitchen... Take 1 tablet by mouth every morning  Orders: Comp Met-[facility] ([id number]) CBC-[facility] (88416) [facility]- Est  Level 4 (99214)  BP today: 119/78 Prior BP: 126/80 (02/05/2010)  Prior 10 Yr Risk Heart Disease: 8 % (02/05/2009)  Labs Reviewed: K+: 4.4 (01/23/2009) Creat: : 0.85 (01/23/2009)   Chol: 181 (01/23/2009)   HDL: 48 (01/23/2009)   LDL: 88 (01/23/2009)   TG: 224 (01/23/2009)  Problem # 2:  DIABETES MELLITUS (ICD-250.00) Good control  Her updated medication list for this problem includes:     Metformin Hcl 500 Mg Tabs (Metformin hcl) .Marland Kitchen... Take 1 tab twice daily  Orders: A1C-[facility] (60630) [facility]- Est  Level 4 (16010)  Labs Reviewed: Creat: 0.85 (01/23/2009)     Last Eye Exam: normal followd by Podiatry (01/08/2009) Reviewed HgBA1c results: 6.4 (04/29/2010)  6.6 (02/05/2010)  Problem # 3:  DYSLIPIDEMIA (ICD-272.4) check today  Orders: Lipid-[facility] ([id number]) [facility]- Est  Level 4 (93235)  Complete Medication List: 1)  Albuterol 90 Mcg/act Aers (Albuterol) .Marland Kitchen.. 1-2 puff qid as needed 2)  Desipramine Hcl 100 Mg Tabs (Desipramine hcl) .... Take 1 tablet by mouth every night 3)  Maxzide-25 37.5-25 Mg Tabs (Triamterene-hctz) .Marland Kitchen.. 1 daily 4)  Flovent Hfa 110 Mcg/act Aero (Fluticasone propionate  hfa) .... Inhale 2 puff using inhaler twice a day 5)  Procardia Xl 90 Mg Tb24 (Nifedipine) .... Take 1 tablet by mouth every morning 6)  Zantac 150 Mg Tabs (Ranitidine hcl) .... Take 1 tablet by mouth once a day 7)  Urocit-k 10 1080 Mg Cr-tabs (Potassium citrate) .Marland Kitchen.. 1 daily 8)  Metformin Hcl 500 Mg Tabs (Metformin hcl) .... Take 1 tab twice daily 9)  Easy Touch Lancets Misc (Lancets) .... As advised 10)  Bl Blood Glucose Monitor Kit Kit (Blood glucose monitoring suppl) .... As advised 11)  Onetouch Ultra Test Strp (Glucose blood) .... Test blood glucose once daily 12)  Gabapentin 300 Mg Caps (Gabapentin) .Marland Kitchen.. 1 by mouth three times a day dr Ralene Cork  Other Orders: Pap Smear-[facility] ([id number])  Patient Instructions: 1)  Please schedule a follow-up appointment in 6 months .  2)  I will call you if your lab is abnormal otherwise I will send you a letter within 2 weeks. 3)  Congratulations on your blood pressure and diabetes! 4)  Work on your [employer]  Prevention & Chronic Care Immunizations   Influenza vaccine: Fluvax MCR  (09/12/2009)    Tetanus booster: 07/14/2009: Tdap    Pneumococcal vaccine: Not documented    H. zoster vaccine: Not documented  Colorectal Screening   Hemoccult:  Done.  (03/30/2001)   Hemoccult due: Not Indicated    Colonoscopy: Done.  (03/29/2001)   Colonoscopy due: 03/30/2011  Other Screening   Pap smear: normal  (12/30/2006)   Pap smear action/deferral: Deferred-3 yr interval  (10/13/2009)   Pap smear due: 12/30/2009    Mammogram: ASSESSMENT:  Negative - BI-RADS 1^MM DIGITAL SCREENING  (12/17/2009)   Mammogram due: 11/2008    DXA bone density scan: Not documented   Smoking status: never  (04/29/2010)  Diabetes Mellitus   HgbA1C: 6.4  (04/29/2010)   Hemoglobin A1C due: 08/09/2008    Eye exam: normal followd by Podiatry  (01/08/2009)   Eye exam due: 01/08/2010    Foot exam: yes  (04/29/2010)   High risk foot: Not documented   Foot care education: Not documented   Foot exam due: 01/08/2010    Urine microalbumin/creatinine ratio: Not documented   Urine microalbumin action/deferral: Not indicated    Diabetes flowsheet reviewed?: Yes   Progress toward A1C goal: At goal  Lipids   Total Cholesterol: 181  (01/23/2009)   LDL: 88  (01/23/2009)   LDL Direct: Not documented   HDL: 48  (01/23/2009)   Triglycerides: 224  (01/23/2009)    SGOT (AST): 26  (01/23/2009)   SGPT (ALT): 10  (01/23/2009) CMP ordered    Alkaline phosphatase: 72  (01/23/2009)   Total bilirubin: 0.4  (01/23/2009)    Lipid flowsheet reviewed?: Yes   Progress toward LDL goal: Unchanged  Hypertension   Last Blood Pressure: 119 / 78  (04/29/2010)   Serum creatinine: 0.85  (01/23/2009)   Serum potassium 4.4  (01/23/2009) CMP ordered     Hypertension flowsheet reviewed?: Yes   Progress toward BP goal: At goal  Self-Management Support :   Personal Goals (by the next clinic visit) :     Personal A1C goal: 7  (07/14/2009)     Personal blood pressure goal: 130/80  (07/14/2009)     Personal LDL goal: 100  (07/14/2009)    Patient will work on the following items until the next clinic visit to reach self-care goals:     Medications and monitoring: weigh myself  weekly  (04/29/2010)     Eating: eat more vegetables, eat fruit for snacks and desserts  (04/29/2010)     Other: [facility]  (07/14/2009)    Diabetes self-management support: Written self-care plan  (04/29/2010)   Diabetes care plan printed    Diabetes self-management support not done because: Good outcomes  (10/13/2009)    Hypertension self-management support: Written self-care plan  (04/29/2010)   Hypertension self-care plan printed.    Hypertension self-management support not done because: Good outcomes  (10/13/2009)    Lipid self-management support: Written self-care plan  (04/29/2010)   Lipid self-care plan printed.    Lipid self-management support not done because: Good outcomes  (10/13/2009)   Nursing Instructions: Diabetic foot exam today    Laboratory Results   Blood Tests   Date/Time Received: April 29, 2010 11:38 AM  Date/Time Reported: April 29, 2010 11:52 AM   HGBA1C: 6.4%   (Normal Range: Non-Diabetic - 3-6%   Control Diabetic - 6-8%)  Comments: ...........test performed by...........Marland KitchenTerese Door, CMA

## 2010-12-29 NOTE — Letter (Signed)
Summary: Generic Letter  [facility]  [address]   [place], [place] [address]   Phone: [phone]  Fax: [phone]    05/01/2010  [name] [address] [place], [place]  [address]  Dear Ms. Pellegrini,  All your lab test look very good except for your triglycerides which are a bit too high.  I would suggest watching the amount of fat you eat and consider taking one fish oil capsule at day.  You can get these over the counter at your drug store.  Have a good summer.  Sincerely,   Pearlean Brownie MD   Appended Document: Generic Letter mailed.

## 2010-12-29 NOTE — Miscellaneous (Signed)
Summary: GAVE NPI T [facility]   Clinical Lists Changes gave NPI to Clydie Braun at [facility] [employer]. Isabel Caprice) [phone]. she is there today for her 6 month f/u & renal ultrasound...Marland KitchenMarland KitchenGolden Circle RN  September 03, 2010 10:43 AM

## 2010-12-31 NOTE — Assessment & Plan Note (Signed)
Summary: forms/eo   Vital Signs:  Patient profile:   60 year old female Height:      62 inches Weight:      247.8 pounds BMI:     45.49 Temp:     98.1 degrees F Pulse rate:   99 / minute BP sitting:   126 / 82  (left arm) Cuff size:   large  Vitals Entered By: Garen Grams LPN (December 14, 2010 2:06 PM) CC: fill out form for diabetic shoes Is Patient Diabetic? Yes Did you bring your meter with you today? No Pain Assessment Patient in pain? yes     Location: shoulder   Primary Care Provider:  Pearlean Brownie MD  CC:  fill out form for diabetic shoes.  History of Present Illness: DIABETES Disease Monitoring Blood Sugar ranges:good control   Polyuria:N   Visual problems:N  Medications Compliance:daily   Hypoglycemic symptoms:N  Feet  No history of ulcer or amputation.   Did have surgery on L for spurs.   Does have change in feeling in feet like walking on balls  ROS - as above PMH - Medications reviewed and updated in medication list.  Smoking Status noted in VS form       Habits & Providers  Alcohol-Tobacco-Diet     Tobacco Status: never  Allergies: No Known Drug Allergies  Physical Exam  General:  Well-developed,well-nourished,in no acute distress; alert,appropriate and cooperative throughout examination Obese Extremities:  Left foot has large callus and bunion deformity at base of large toe with deformation laterally approximately 30 degrees   Impression & Recommendations:  Problem # 1:  BUNION, LEFT FOOT (ICD-727.1)  at risk for ulceration and infection given diabetes.  Will prescribe diabetic shoes  Orders: [facility]- Est Level  3 (96045)  Problem # 2:  DIABETES MELLITUS (ICD-250.00) Good control with a1c < 7  Her updated medication list for this problem includes:    Metformin Hcl 500 Mg Tabs (Metformin hcl) .Marland Kitchen... Take 1 tab twice daily  Orders: A1C-[facility] (40981)  Labs Reviewed: Creat: 0.80 (04/29/2010)     Last Eye Exam: normal followd  by Podiatry (01/08/2009) Reviewed HgBA1c results: 6.5 (11/04/2010)  6.4 (04/29/2010)  Problem # 3:  OBESITY (ICD-278.00)  [name] set goal of attending silver sneakers 4x a week.   Will f/u with her in 1-2 weeks about goal and to set an appointment to meet with me here at clinic.  Arlys John  December 14, 2010 2:48 PM Again discussed this is her main health issue.  Pearlean Brownie MD  December 14, 2010 3:07 PM  Orders: [facility]- Est Level  3 [phone])  Complete Medication List: 1)  Albuterol 90 Mcg/act Aers (Albuterol) .Marland Kitchen.. 1-2 puff qid as needed 2)  Desipramine Hcl 100 Mg Tabs (Desipramine hcl) .... Take 1 tablet by mouth every night 3)  Triamterene-hctz 75-50 Mg Tabs (Triamterene-hctz) .... 1/2 pill daily 4)  Flovent Hfa 110 Mcg/act Aero (Fluticasone propionate  hfa) .... Inhale 2 puff using inhaler twice a day 5)  Procardia Xl 90 Mg Tb24 (Nifedipine) .... Take 1 tablet by mouth every morning 6)  Zantac 150 Mg Tabs (Ranitidine hcl) .... Take 1 tablet by mouth once a day 7)  Urocit-k 10 1080 Mg Cr-tabs (Potassium citrate) .Marland Kitchen.. 1 daily 8)  Metformin Hcl 500 Mg Tabs (Metformin hcl) .... Take 1 tab twice daily 9)  Easy Touch Lancets Misc (Lancets) .... As advised 10)  Bl Blood Glucose Monitor Kit Kit (Blood glucose monitoring suppl) .... As advised  11)  Onetouch Ultra Test Strp (Glucose blood) .... Test blood glucose once daily 12)  Gabapentin 300 Mg Caps (Gabapentin) .Marland Kitchen.. 1 by mouth three times a day dr Ralene Cork  Other Orders: Influenza Vaccine MCR [phone])  Patient Instructions: 1)  Let me know if any problems with getting the shoes 2)  See me bac in 6 months   Orders Added: 1)  Influenza Vaccine MCR [00025] 2)  [facility]- Est Level  3 [60454]   Immunizations Administered:  Influenza Vaccine # 1:    Vaccine Type: Fluvax MCR    Site: right deltoid    Mfr: GlaxoSmithKline    Dose: 0.5 ml    Route: IM    Given by: Jone Baseman CMA    Exp. Date: 05/29/2011    Lot #:  [id number]    VIS given: 06/23/10 version given December 14, 2010.  Flu Vaccine Consent Questions:    Do you have a history of severe allergic reactions to this vaccine? no    Any prior history of allergic reactions to egg and/or gelatin? no    Do you have a sensitivity to the preservative Thimersol? no    Do you have a past history of Guillan-Barre Syndrome? no    Do you currently have an acute febrile illness? no    Have you ever had a severe reaction to latex? no    Vaccine information given and explained to patient? yes    Are you currently pregnant? no   Immunizations Administered:  Influenza Vaccine # 1:    Vaccine Type: Fluvax MCR    Site: right deltoid    Mfr: GlaxoSmithKline    Dose: 0.5 ml    Route: IM    Given by: Jone Baseman CMA    Exp. Date: 05/29/2011    Lot #: [id number]    VIS given: 06/23/10 version given December 14, 2010.

## 2011-01-01 NOTE — Consult Note (Signed)
[facility]: [facility]   Imported By: De Nurse 09/16/2010 15:14:42  _____________________________________________________________________  External Attachment:    Type:   Image     Comment:   External Document

## 2011-01-01 NOTE — Consult Note (Signed)
Summary: [facility]   Imported By: Clydell Hakim 03/12/2010 10:20:52  _____________________________________________________________________  External Attachment:    Type:   Image     Comment:   External Document

## 2011-02-23 ENCOUNTER — Other Ambulatory Visit: Payer: Self-pay | Admitting: Family Medicine

## 2011-02-23 DIAGNOSIS — J45909 Unspecified asthma, uncomplicated: Secondary | ICD-10-CM

## 2011-02-23 NOTE — Telephone Encounter (Signed)
Refill request

## 2011-03-01 ENCOUNTER — Other Ambulatory Visit: Payer: Self-pay | Admitting: Family Medicine

## 2011-03-01 DIAGNOSIS — F3289 Other specified depressive episodes: Secondary | ICD-10-CM

## 2011-03-01 DIAGNOSIS — F329 Major depressive disorder, single episode, unspecified: Secondary | ICD-10-CM

## 2011-03-01 DIAGNOSIS — J45909 Unspecified asthma, uncomplicated: Secondary | ICD-10-CM

## 2011-03-01 NOTE — Telephone Encounter (Signed)
Refill request

## 2011-03-27 ENCOUNTER — Other Ambulatory Visit: Payer: Self-pay | Admitting: Family Medicine

## 2011-03-27 NOTE — Telephone Encounter (Signed)
Refill request

## 2011-03-31 ENCOUNTER — Other Ambulatory Visit: Payer: Self-pay | Admitting: Family Medicine

## 2011-03-31 NOTE — Telephone Encounter (Signed)
Refill request

## 2011-04-16 NOTE — H&P (Signed)
[name], [name]                ACCOUNT NO.:  [id number]   MEDICAL RECORD NO.:  [id number]          PATIENT TYPE:  INP   LOCATION:  NA                           FACILITY:  [facility]   PHYSICIAN:  John L. Rendall, M.D.  DATE OF BIRTH:  Nov 11, 1951   DATE OF ADMISSION:  DATE OF DISCHARGE:                                HISTORY & PHYSICAL   DATE OF ADMISSION:  May 10, 2005   DATE OF BIRTH:  July 19, 1951   CHIEF COMPLAINT:  Right knee pain for the last 15-20 years.   HISTORY OF PRESENT ILLNESS:  This 60 year old black female patient presented  to Dr. Priscille Kluver initially with complaints of bilateral knee pain. This had  been going on for about 15-20 years and had been gradual in onset and  getting progressively worse. She has subsequently undergone a left knee  replacement by Dr. Priscille Kluver in August 2005 and has done well with that, and  wishes now to have surgery on her right knee. She has had no other injury or  prior surgery to the right knee.   At this point the knee pain is described as a constant throbbing sensation  which is diffuse about the joint line with occasional radiation up and down  the leg. The pain increases with any prolonged walking or standing and then  decreases with the use of Vicodin. This, however, only helps a moderate  amount. She does complain of the knee popping, slipping at times, and even  giving way. The knee does swell at times and occasionally keeps her up at  night. She has received cortisone injections in the past with minimal relief  but no viscosupplementation. She has been using a cane for the knee for the  last 2 years.   ALLERGIES:  No known drug allergies.   CURRENT MEDICATIONS:  1.  Nifedipine ER 90 mg one tablet p.o. q.a.m.  2.  Triamterene/hydrochlorothiazide 37.5/25 mg one tablet p.o. q.a.m.  3.  Vicodin 5/500 mg one tablet p.o. q.i.d. p.r.n.  4.  Desipramine 50 mg two tablets p.o. q.h.s.  5.  Urocit-K 1080 mg two tablets p.o. b.i.d.  6.  Albuterol inhaler two puffs inhaled q.6h. p.r.n. (she normally uses this      three times a day).   PAST MEDICAL HISTORY:  1.  Hypertension for 26 years.  2.  Asthma diagnosed 2-3 years ago. She denies any hospitalizations or      intubations.  3.  History of migraines.  4.  History of kidney stones.   She denies any history of diabetes mellitus, thyroid disease, hiatal hernia,  reflux, peptic ulcer disease, heart disease, or any other chronic medical  condition other than noted previously.   PAST SURGICAL HISTORY:  1.  Laparoscopic cholecystectomy in 2003.  2.  Removal of kidney stone in 2004.  3.  Bilateral tubal ligation in 1980.  4.  Removal of keloids from bilateral ears in 1978.  5.  Left total knee arthroplasty by Dr. Jonny Ruiz L. Rendall July 22, 2004.   She denies any complications from the above-mentioned procedures.  SOCIAL HISTORY:  She denies any history of cigarette use, drug use, or  alcohol use. She is married and has two children and one step-daughter. She  lives with her husband and her nieces and nephews age 34, 24, and 7 in a one-  story house with one step into the main entrance. She is retired from  housekeeping. Her medical doctor is Dr. Deirdre Priest at [facility] at [phone].   FAMILY HISTORY:  Mother died at the age of 72 with a stroke and  hypertension. Father died at the age of 72 with coronary artery disease. She  had one brother who died at the age of 73 with a brain tumor, and then has  three living brothers and five living sisters. Her sisters are 32, 45, 84,  61, and 28, and brothers 10, 45, and 48. Siblings have a history of  hypertension and diabetes. Her children are a daughter age 43, son age 68,  and they are alive and well.   REVIEW OF SYSTEMS:  She does wear glasses for reading. She does have some  shortness of breath with exertion. Complains of occasional diarrhea. She  does have a history of migraines. She does not have  a Living Will nor power  of attorney. All other systems are negative and noncontributory.   PHYSICAL EXAMINATION:  GENERAL:  Well-developed, well-nourished, overweight  black female in no acute distress. Walks with a limp and the use of a cane.  Mood and affect are appropriate. Talks easily with examiner. Height 5 feet 2  inches, weight 264 pounds, BMI is 48.  VITAL SIGNS:  Temperature 97.3 degrees Fahrenheit, pulse 84, respirations  20, and blood pressure 120/60.  HEENT:  Normocephalic, atraumatic, without frontal or maxillary sinus  tenderness to palpation. Conjunctivae pink, sclerae anicteric. PERRLA. EOMs  intact. No visible external ear deformities. Hearing grossly intact.  Tympanic membranes pearly gray bilaterally with good light reflux. Nose and  nasal septum midline. Nasal mucosa pink and moist without exudates or polyps  noted. Buccal mucosa pink and moist. Dentition in good repair. Pharynx  without erythema or exudates. Tongue and uvula midline. Tongue without  fasciculations and uvula rises equally with phonation.  NECK:  No visible masses or lesions noted. No palpable lymphadenopathy nor  thyromegaly. Carotids +2 bilaterally without bruits. Full range of motion  and nontender to palpation along the cervical spine.  CARDIOVASCULAR:  Heart rate and rhythm regular. S1 and S2 present without  rubs, clicks, or murmurs noted.  RESPIRATORY:  Respirations even and unlabored. Breath sounds clear to  auscultation bilaterally without rales or wheezes noted.  ABDOMEN:  Rounded, obese abdominal contour with well-healed laparoscopic  incision lines. Bowel sounds present x4 quadrants. Soft, nontender to  palpation without hepatosplenomegaly nor CVA tenderness. Femoral pulses +1  bilaterally. Nontender to palpation along the vertebral column.  BREASTS:  She does have two well-healed left breast scars that are kind of horizontal but no other masses or lesions noted.   GENITOURINARY/RECTAL/PELVIC:  These exams deferred at this time.  MUSCULOSKELETAL:  She does have an anterior left shoulder incision line and  a little laceration in the mid aspect of her left biceps region that are  well healed and slightly keloid in appearance. There is no other deformity  bilateral upper extremities with full range of motion of these extremities  without pain. Radial pulses are +2 bilaterally. She has full range of motion  of her hips, ankles, and toes bilaterally. DP and  PT pulses are +2. There is  some mild lower extremity edema but it is nonpitting. Hip range of motion  seems to be equal bilaterally but with flexion only to about 90 degrees due  to hitting her abdomen.   Left knee has a well-healed midline incision. She has full extension and  flexion to 95 degrees without crepitus. She is stable to varus and valgus  stress. Negative anterior drawer. Some mild tenderness to palpation along  the joint line. No calf pain with palpation. Negative Homans' sign. Right  knee skin is intact without erythema or ecchymosis. She has full extension  and flexion only to 90 degrees with some mild crepitus with range of motion.  She has probably a +1-2 effusion of the knee. She is tender to palpation  both on the medial and lateral joint line. Stable to varus and valgus stress  and negative anterior drawer.  NEUROLOGIC:  Alert and oriented x3. Cranial nerves II-XII are grossly  intact. Strength is 5/5 bilateral upper and lower extremities. Sensation  intact to light touch. Rapid alternating movements intact.   RADIOLOGIC FINDINGS:  X-rays taken on her right knee in 2005 show bone-on-  bone contact in the medial compartment with sclerotic changes and spurring.  Also, spurs at the lateral aspect of the femoral condyle and lateral tibial  plateau. She also has multiple osteophytes noted on the surface of the  patella.   IMPRESSION:  1.  End-stage osteoarthritis right knee status  post left knee replacement.  2.  Hypertension.  3.  Asthma.  4.  Obesity.  5.  History of migraines.  6.  History of kidney stones.   PLAN:  [name] will be admitted to [facility] on May 10, 2005  where she will undergo a right total knee arthroplasty by Dr. Jonny Ruiz L.  Rendall. She will undergo all the routine preoperative laboratory tests and  studies prior to this procedure. If we have any medical issues while she is  hospitalized, we will consult [facility].       KED/MEDQ  D:  05/04/2005  T:  05/04/2005  Job:  [id number]

## 2011-04-16 NOTE — Op Note (Signed)
[facility]  Patient:    [name], [name] Visit Number: [id number] MRN: [id number]          Service Type: MED Location: [phone] Attending Physician:  Delsa Bern Dictated by:   Lorne Skeens. Hoxworth, M.D. Proc. Date: 05/09/02 Admit Date:  05/09/2002 Discharge Date: 05/11/2002                             Operative Report  PREOPERATIVE DIAGNOSES:  Cholelithiasis and cholecystitis.  POSTOPERATIVE DIAGNOSES:  Cholelithiasis and cholecystitis.  SURGICAL PROCEDURE:  Laparoscopic cholecystectomy with intraoperative cholangiogram.  SURGEON:  Sharlet Salina T. Hoxworth, M.D.  ASSISTANT:  Anselm Pancoast. Zachery Dakins, M.D.  ANESTHESIA:  General.  BRIEF HISTORY:  Ms. Lampron is a 60 year old black female with a history of repeated episodes of right flank pain who presents today with persistent nausea, right flank pain and right upper quadrant tenderness. Gallbladder ultrasound has shown a thickened gallbladder wall with multiple gallstones and a somewhat dilated bile duct. LFTs were normal. Laparoscopic cholecystectomy with cholangiogram has been recommended and accepted. The nature of the procedure, its indications, risks of bleeding, infection, bile leak and bile duct injury and possible needle for open surgery were discussed and understood preoperatively. She is now brought to the operating room for this procedure.  DESCRIPTION OF PROCEDURE:  The patient was brought to the operating room, placed in supine position on the operating table and general endotracheal anesthesia was induced. She had received preoperative antibiotics. The abdomen was sterilely prepped and draped. The abdomen was obese and I made a midline 1 cm incision several centimeters above the umbilicus and dissection was carried down to the midline fascia which was sharply incised for 1 cm and the peritoneum entered under direct vision. Through a mattress suture of #0 Vicryl, the  Hasson trocar was placed, pneumoperitoneum established. Under direct vision, a 10 mm trocar was placed in the subxiphoid area and two 5 mm trocars along the right subcostal margin. The gallbladder was chronically quite thickened but was not acutely inflamed. It was literally packed with large stones. The fundus was grasped and elevated over the liver. The distal gallbladder was encased in fatty tissue and the distal gallbladder anatomy was difficult. We grasped the gallbladder where it clearly could be seen on the gallbladder wall and fibrofatty tissue was then stripped down off the gallbladder toward the porta hepatis. There were some adhesions of the duodenum that were carefully taken down. Dissection progressed distally and the anatomy was somewhat difficult due to incasement of the gallbladder. As we worked down towards the porta hepatis, the gallbladder was seen to narrow. Dissection was kept right on the gallbladder wall and just distal to this what appeared to be gallbladder wall widened out somewhat again. We were concerned at this point that this could be tinted up common bile duct. This was dissected to 360 degrees and Calots triangle completely dissected. The cystic artery was identified coursing up along the gallbladder wall and it was singly clipped but not yet divided. At this point, I elected to obtain and operative cholangiogram. The gallbladder was clipped just above the area where it began to widen out again and a small nick was made just below this and Reddick catheter inserted and operative cholangiogram was obtained. This showed a very short segment of remaining cystic duct and prompt filling of the common bile duct and intrahepatic ducts with free flow into the duodenum and no filling  defects and the common bile duct appeared mildly diffusely enlarged. This appeared to be a very short broad cystic duct almost a situation with the gallbladder inserted directly into the  side of the common bile duct. There was, however, a short segment that we could span with clips just essentially at the site of the insertion of the Cholangiocatheter. The Cholangiocatheter was removed and this area was doubly clipped and both clips could be seen to span this area. The gallbladder was clipped above this and then divided and the cystic artery previously clipped was doubly clipped, divided. The gallbladder was then dissected free from its bed using hook cautery. It was placed in an endocatch bag an brought in the umbilicus and multiple large stones removed and the gallbladder extracted through the umbilicus. The right upper quadrant was irrigated and hemostasis obtained in the gallbladder bed with cautery. I elected to leave a drain in the subhepatic space and this was brought out through a stab wound and left in Morisons pouch. The operative site was again inspected for hemostasis and trocars removed under direct vision and all CO2 evacuated. The mattress suture was secured at the umbilicus. The skin incisions were closed with interrupted subcuticular 4-0 monocryl and Steri-Strips. Sponge, needle and instrument counts were correct. Dry sterile dressings were applied and the patient taken to the recovery room in good condition. Dictated by:   Lorne Skeens. Hoxworth, M.D. Attending Physician:  Delsa Bern DD:  05/09/02 TD:  05/11/02 Job: [id number]

## 2011-04-16 NOTE — Op Note (Signed)
NAME:  [name], [name] NO.:  [id number]   MEDICAL RECORD NO.:  [id number]                   PATIENT TYPE:  INP   LOCATION:  5707                                 FACILITY:  [facility]   PHYSICIAN:  Valetta Fuller, M.D.               DATE OF BIRTH:  04-05-1951   DATE OF PROCEDURE:  DATE OF DISCHARGE:  03/15/2004                                 OPERATIVE REPORT   PREOPERATIVE DIAGNOSES:  1. Right UPJ stone 12 mm.  2. Hydronephrosis.   POSTOPERATIVE DIAGNOSES:  1. Right UPJ stone 12 mm.  2. Hydronephrosis.   PROCEDURE:  Cystoscopy, retrograde pyelography, double-J stent placement.   SURGEON:  Valetta Fuller, M.D.   ANESTHESIA:  General.   INDICATIONS FOR PROCEDURE:  [name] is a 60 year old female. She has no  previous urologic history. She presented to the [facility] Emergency Room  yesterday with several days of severe, right-sided abdominal pain.  CT scan  showed a 12 mm stone at her right ureteropelvic junction with high-grade  obstruction.  Her pain was not well managed. For that reason, the patient  was admitted to the hospital and given supportive care. She presents now for  decompression of the right system.   DESCRIPTION OF PROCEDURE:  The patient was brought to the operating room  where she had successful induction of general anesthesia.  She was placed in  the lithotomy position and prepped and draped in the usual manner.  Cystoscopy was unremarkable. Retrograde pyelogram showed a normal ureter but  a significant filling defect at the UPJ/proximal ureter.  There was moderate  hydronephrosis.   Initial attempts at guide wire placement were not successful.  We used a  Glidewire and were able to get into the upper calyceal system.  Once there,  we placed a 7 [place], 24 double-J stent without difficulty.  The patient  appeared tolerated the procedure well, was brought to the recovery room in  stable condition.                   Valetta Fuller, M.D.    DSG/MEDQ  D:  03/15/2004  T:  03/16/2004  Job:  [id number]

## 2011-04-16 NOTE — Discharge Summary (Signed)
[place]. [facility]  Patient:    [name], [name] Visit Number: [id number] MRN: [id number]          Service Type: MED Location: [phone] Attending Physician:  McDiarmid, Leighton Roach. Dictated by:   Mont Dutton, M.D. Admit Date:  09/20/2001 Discharge Date: 09/21/2001   CC:         [name], M.D.   Discharge Summary  DATE OF BIRTH:  25-Aug-1951  ADMISSION DIAGNOSIS:  Chest pain.  DISCHARGE DIAGNOSIS:  Chest pain, resolved.  CONSULTATIONS:  Pharmacy for heparin.  PROCEDURES:  None.  PRIMARY PHYSICIAN:  [name], M.D.  HISTORY OF PRESENT ILLNESS:  The patient is a 60 year old African-American female who presented from her work place for left substernal chest pain that had onset while she was bent over cleaning a room.  Initially became dyspneic, then flushed.  She had some nausea with this.  Initially given two aspirin, some nitroglycerin which helped decrease chest pain, and given two more aspirin and one nitroglycerin which further caused relief of her chest pain. The patient was brought to the [facility] ER, where she was admitted for rule out MI.  Please see the remaining details of this admission history and physical on the dictated admission history and physical, #[id number].  HOSPITAL COURSE:  The patient admitted for rule out MI; was placed on 81 mg of aspirin p.o. q.d., metoprolol 12.5 mg p.o. b.i.d., oxygen via nasal cannula at 2 L, nitroglycerin drip which was titrated to pain, Claritin 10 mg p.o. q.d., iron sulfate 325 mg p.o. q.d., ______ 20 mg p.o. q.d., Tylenol 650 mg p.o. q.4h. p.r.n. headache or pain, and hydrochlorothiazide 25 mg p.o. q.d.  The patient also started on heparin per pharmacy protocol, started on Plavix 300 mg x 1 and then 75 mg p.o. q.d.  Also started on Protonix 40 mg p.o. q.d. The patients heparin was adjusted by pharmacy, according to her PTT.  Cardiac enzymes including troponin,  CK, and CK-MB were drawn q.8h. and serial EKGs were performed.  EKGs showed no acute changes, no evidence of ischemia, normal sinus rhythm.  Troponin levels were 0.04, 0.04, and 0.03.  CK levels of 164, 161, and 130 with an MB fraction of 2.6., 2.1, and 1.6.  CBC on admission 11.6, hemoglobin 11.4, hematocrit 33.9, and platelets of 289.  On day after admission, the patient denied any chest pain or nausea and no other complaints at that time.  She stated she did have some mild dyspnea upon walking around but was nothing of recent onset.  Her chest pain had resolved with no EKG changes and cardiac enzymes normal.  The patient only had risk factors for hypertension and borderline hypercholesterolemia, per admission history and physical.  The decision was made to discharge the patient to home with risk stratification as an outpatient including the patients anemia with hemoglobin 11.4.  The patient had a history of anemia and was on iron sulfate.  No further intervention was instituted in the hospital.  Of note, also obtained a hemoglobin A1c in hospital that was 5.9.  As the patients chest pain resolved, it was most likely secondary to GERD or musculoskeletal in nature. As such, a GERD dosing of Prevacid was given to the patient upon discharge, 15 mg p.o. q.d.  The patient was also instructed to schedule a Cardiolite stress test as an outpatient here through [facility].  A number was provided for scheduling.  The  patients discharge date was September 21, 2001.  DISCHARGE CONDITION:  Stable.  DISPOSITION:  Discharged to home.  DISCHARGE MEDICATIONS: 1. Prevacid 15 mg p.o. q.d. 2. Nitroglycerin tablets take 1 tablet sublingually with any chest pain.  May    take another tablet every five minutes x 2 more.  If taking a third tablet,    call M.D. 3. The patient is also noted to restart her other home medications.  ACTIVITY:  No restrictions.  May return to work on Monday.  DIET:  No  restrictions.  WOUND CARE:  Not applicable.  DISCHARGE INSTRUCTIONS:  The patient was to call [phone] to schedule Cardiolite stress test and was given a prescription for this test.  FOLLOW-UP:  The patient to schedule a follow-up with Dr. Deirdre Smith in the next 1-2 weeks.  She was given the phone number, [phone]. Dictated by:   Mont Dutton, M.D. Attending Physician:  McDiarmid, Tawanna Cooler D. DD:  09/21/01 TD:  09/25/01 Job: [id number]

## 2011-04-16 NOTE — Discharge Summary (Signed)
NAME:  [name], [name]                          ACCOUNT NO.:  [id number]   MEDICAL RECORD NO.:  [id number]                   PATIENT TYPE:  INP   LOCATION:  0484                                 FACILITY:  [facility]   PHYSICIAN:  John L. Rendall, M.D.               DATE OF BIRTH:  Jun 03, 1951   DATE OF ADMISSION:  07/22/2004  DATE OF DISCHARGE:  07/24/2004                                 DISCHARGE SUMMARY   ADMISSION DIAGNOSIS:  1.  End stage osteoarthritis of bilateral knees, currently left more      symptomatic than the right.  2.  Hypertension.  3.  Asthma.  4.  Morbid obesity.  5.  History of migraines.  6.  History of kidney stones.   DISCHARGE DIAGNOSIS:  1.  Status post left total knee arthroplasty.  2.  End stage osteoarthritis of right knee.  3.  Acute blood loss anemia secondary to surgery, asymptomatic.  4.  Leukocytosis (afebrile).  5.  Hypertension.  6.  Asthma.  7.  Morbid obesity.  8.  History of migraines.  9.  History of kidney stones.   HISTORY OF PRESENT ILLNESS:  [name] is a 60 year old female with a 20  year history of progressively worsening bilateral knee pain.  The pain  became significantly worse over the last 5-6 years.  She has pain with any  attempts of range of motion.  She has difficulty with range of motion due to  discomfort.  She describes the pain as a deep, aching, burning quality pain  within the knee and throughout.  No specific one spot is painful.  The pain  does radiate up and down the entire leg.  She has mechanical symptoms with  attempts of range of motion and ambulation.  The patient has swelling of the  knee.  She denies any previous surgery or injury to the knee.  Currently,  she feels the left knee is slightly worse than the right.  However, x-rays  reveal bone on bone of the medial compartment with extensive loose bodies  throughout the joint of both knees.   ALLERGIES:  No known drug allergies.   CURRENT MEDICATIONS:   Triamterene/hydrochlorothiazide 37.5/25 mg p.o. daily,  Percocet 1 pill t.i.d., Desipramine 100 mg p.o. q.h.s., Albuterol meterdose  inhaler 2 puffs q.6h., Urocit K 1080 mg 2 tablets p.o. daily.   SURGICAL PROCEDURE:  The patient was taken to the operating room on July 22, 2004, by Dr. Jonny Ruiz Rendall assisted by Arnoldo Morale, P.A.-C.  The patient  was placed under general anesthesia and a computer navigation assisted left  total knee replacement was performed.  The following components were used:  Size 2.5 tibia with a 15 mm bearing and a standard femur and a three peg  patella.  The patient tolerated the procedure well and returned to the  recovery room in good, stable condition.   CONSULTATIONS:  The following consultations were obtained while the patient  was hospitalized:  PT/OT, case management, and rehab.   HOSPITAL COURSE:  Postoperative day one, the patient H&H was 9.2 and  hematocrit 28, this was felt to be secondary to surgery.  The patient was  asymptomatic.  Also, on postoperative day one, the patient was afebrile,  vital signs stable.  The patient did develop leukocytosis with white count  19,000, but remained afebrile.  On postoperative day two, acute blood loss  anemia secondary to surgery remained asymptomatic despite the patient's  hemoglobin of 8.6 and hematocrit 26.2.  White count slightly elevated  compared to the day prior at 20,200.  The patient did remain afebrile.  UA  and culture and sensitivity were ordered.  The patient was progressing well  with therapy and a bed was available for rehab, therefore, the patient was  transferred to rehab in good, stable condition.   LABORATORY DATA:  Routine labs on admission dated July 15, 2004, were CBC  with white count 12,800, hemoglobin 12.4, hematocrit 37.6, platelets 302.  Coags on admission were within normal limits.  Routine chemistries on  admission with sodium 131, potassium 3.6, chloride 96, bicarb 30, glucose   143, BUN 12, creatinine 0.8.  Routine hepatic enzymes on admission with AST  40, LT 13, ALT 71, total bilirubin 0.7.  UA on admission was negative.  Chest x-ray performed on March 13, 2004, showed no active disease.  EKG on  admission showed sinus tach,  heart rate 108 beats per minute, PR interval  142 milliseconds, PRT axis 48, 31, 86.   DISCHARGE MEDICATIONS:  The patient was discharged to rehab with Colace 100  mg p.o. b.i.d., Senokot 1 tab p.o. b.i.d. 1/2 hour a.c., laxative of choice  p.r.n. constipation, Reglan 10 mg p.o. IV q.8h. p.r.n. nausea, Percocet 1-2  tablets p.o. q.4h. p.r.n. pain, acetaminophen 325 to 650 mg p.o. q.4h.  p.r.n. temperature greater than 100.5, Robaxin 500 mg p.o./IV q.6h. p.r.n.  spasm, Restoril 15 mg p.o. q.h.s. p.r.n. sleep, Arixtra 2.5 mg subcu q.p.m.  x 7 days start the night of surgery at 9 p.m. and then give at 8 p.m.  thereafter, Nifedipine 90 mg p.o. q.a.m., triamterene/HCTZ 37.5/25 mg p.o.  q.a.m., Albuterol meterdose inhaler 2 puffs q.6h.,  Urocit K 1080 mg 2  tablets p.o. daily, Desipramine 100 mg q.h.s., Trazodone 1 p.o. b.i.d.,  OxyContin 10 mg 1 tablet q.12h.  The patient's meds were to be adjusted by  rehab physician.   DISCHARGE DIET:  Diet:  Regular diet, advance as tolerated.   CONDITION ON DISCHARGE:  The patient was discharged to rehab in good, stable  condition.   FOLLOW UP:  The patient needs follow up with Dr. Priscille Kluver in the office in  approximately 14 days from surgery.  The patient is to call our office at  [phone] for an appointment.   SPECIAL INSTRUCTIONS:  CPM 0 to 90 degrees 6-8 hours a day, increase by 10  degrees daily.  UA and culture and sensitivity are to be checked by rehab  physician.  Wound is to be kept clean and dry and daily dressing changes are  to be maintained.  The patient may shower after two consecutive days of no  drainage.  The patient is to call our office for temperature greater than 101.5, chills,  foul smelling drainage or pains not controlled with pain  medications.     Richardean Canal, P.A.  John L. Priscille Kluver, M.D.    GC/MEDQ  D:  08/13/2004  T:  08/13/2004  Job:  [id number]

## 2011-04-16 NOTE — H&P (Signed)
[place]. [facility]  Patient:    BRIZZA, NATHANSON Visit Number: [id number] MRN: [id number]          Service Type: Attending:  Mena Pauls, M.D. Dictated by:   Solon Palm, M.D. Adm. Date:  09/20/01   CC:         Marshall L. Deirdre Priest, M.D.                         History and Physical  CHIEF COMPLAINT: Chest pain.  HISTORY OF PRESENT ILLNESS: The patient is a 60 year old African-American female, a patient of primary Dr. Oda Cogan, who presented to EMS from her work place [employer]) for a complaint of left sternal sharp chest pain that had onset while she was bent over cleaning a room.  She initially became dyspneic, then flushed, then nonradiating substernal chest pain developed, then nausea ensued, but no vomiting.  She was initially given two aspirin and two nitroglycerin, which decreased her chest pain from an 8 to a 3.  EMS was called and on transport gave her two more aspirin and one more nitroglycerin, which decreased chest pain to 1/10.  The patient currently has 1/10 chest pain, on a nitroglycerin patch in the emergency department.  She currently denies dyspnea and diaphoresis.  PAST MEDICAL HISTORY:  1. Hypertension.  2. Osteoarthritis in knees.  3. Asthma.  4. Anemia.  5. Borderline hypercholesterolemia.  6. Obesity.  PAST SURGICAL HISTORY:  1. Bilateral tubal ligation.  2. Recent EGD and colonoscopy for work-up of anemia in May 2002, which was     normal.  FAMILY HISTORY: Mother died at 58 years secondary to diabetic complications, also had hypertension.  Father died at 42 years old of cardiac disease.  She has three brothers and two sisters, and diabetes and hypertension runs in her siblings.  SOCIAL HISTORY: She works as a Advertising copywriter at [employer]  She does take care of her brothers children (he is in jail), ages four, 32, ten, and 48. She is married, and has marital problems.  No tobacco, no  alcohol.  MEDICATIONS:  1. Albuterol MDI p.r.n.  2. Ferrous sulfate 325 mg q.d.  3. Nifedipine 90 mg q.d.  4. Piroxicam 20 mg q.d.  5. Dyazide 25/37.5 mg q.d.  6. Glucosamine 1000 mg b.i.d.  7. Tylenol.  8. Claritin.  REVIEW OF SYSTEMS: GENERAL: No recent URI, fever, chills, or weight change. CARDIOVASCULAR: As above.  The patient also admits to occasional orthopnea, none currently.  RESPIRATORY: She did not wheeze this morning, no treatment taken today for her asthma.  GI: She suffers occasionally from heartburn, depending on what she eats, though is not treated for that.  No melena, no hematochezia.  She has had a history of eczema, but no recent flares. PSYCHIATRIC: She denies depressive symptoms though, per her primary, has admitted to them in the past.  MUSCULOSKELETAL: She has arthritis in both knees.  Has recently decreased the number of hours she works because of her knee pain.  Has not been evaluated by ortho.  No history of surgery. ENDOCRINE: No polyuria, no polydipsia.  ENT: She denies any seasonal allergies.  EYES: She does not wear glasses but says she has difficulty reading small letters.  GU: She still has normal menses.  Last menstrual period was September 11, 2001.  PHYSICAL EXAMINATION:  VITAL SIGNS: On initial presentation to the ER, T 97.5 degrees, heart rate 108, blood pressure 136/80.  O2 saturation 97%.  On re-evaluation the patients heart rate is to 83.  GENERAL: The patient is calm and in no acute distress, sitting in bed.  PSYCHIATRIC: Judgement and insight intact.  Alert and oriented x 4.  Affect is flat.  HEENT: PERRLA.  EOMI.  Muddy sclerae.  Tympanic membrane bilaterally normal. Dentition good.  No posterior pharynx erythema or exudate.  No tonsillar adenopathy.  NECK: She does have a thick neck.  Neck supple with no thyromegaly, no lymphadenopathy.  RESPIRATORY: She has poor respiratory effort diffusely.  No wheezes, rales  or rhonchi.  CARDIAC: Distant, regular rate of 83.  No JVD.  Difficult to assess PMI secondary to to habitus.  No murmurs, clicks, or gallops.  She has chest wall pain at the left third and fourth sternal costal junction.  ABDOMEN: Obese, soft, nontender, nondistended.  No rebound, guarding, or rigidity.  Difficult to assess for hepatosplenomegaly secondary to habitus.  EXTREMITIES: She does have +1 right pedal edema.  She has bilateral trace pretibial edema.  SKIN: No rashes noted.  No lymphadenopathy appreciated.  NEUROLOGIC: Cranial nerves intact.  Reflexes are +1 x 4 extremities.  RECTAL: Good tone.  Scant brown stool that is heme negative.  LABORATORY DATA: Hemoglobin 12.5, hematocrit 37.2, WBC 11.3, platelets 387,000.  Sodium 139, potassium 3.7, chloride 103, CO2 27, BUN 13, creatinine 0.6, glucose 124.  Chest x-ray shows no acute disease, some cardiomegaly noted.  EKG shows sinus tachycardia with no acute ST changes noted.  Cardiac enzymes show CK 164, MB 2.6, relative index 1.6, troponin 0.04.  ASSESSMENT/PLAN: This is a 60 year old black female with chest pain.  Unstable angina.  Patient without previous cardiac history, having chest pain with some symptoms typical for symptoms for unstable angina, brought on by exertion, relieved mostly with rest and nitroglycerin.  Still having slight amount of chest pain in the emergency department.  No electrocardiogram changes is assuring and discomfort is reproducible.  Etiology may well be strictly musculoskeletal.  Risk factors include hypertension and presentation of this chest pain.  Will admit for 23 hour observation.  Will check cardiac enzymes, check on telemetry.  Begin nitroglycerin, oxygen, beta-blocker, aspirin, heparin, and Plavix.  Will likely benefit from further study like a stress test as an outpatient.  Will also start a Proton pump inhibitor on this patient. Dictated by:   Solon Palm, M.D. Attending:  Mena Pauls, M.D.  DD:  09/20/01 TD:  09/21/01 Job: [id number]

## 2011-04-16 NOTE — H&P (Signed)
NAME:  [name], [name]                          ACCOUNT NO.:  [id number]   MEDICAL RECORD NO.:  [id number]                   PATIENT TYPE:  INP   LOCATION:  NA                                   FACILITY:  [facility]   PHYSICIAN:  John L. Rendall, M.D.               DATE OF BIRTH:  1951-04-04   DATE OF ADMISSION:  07/22/2004  DATE OF DISCHARGE:                                HISTORY & PHYSICAL   CHIEF COMPLAINT:  Bilateral knee pain.   HISTORY OF PRESENT ILLNESS:  The patient is a 60 year old black female with  about a 20 year history of progressively worsening bilateral knee pain  significantly worsened over the last 5-6 years.  She has pain with any  attempts at range of motion, and she has difficulty with range of motion due  to mechanical and discomfort.  She describes the pain as a deep, aching,  burning quality deep within the knee and throughout it, no one specific  spot.  The pain does radiate up and down the entire leg.  She does have all  types of mechanical symptoms with attempts at range of motion and  ambulating.  She does have swelling.  She denies any previous surgical  procedure or injuries to the knee.  Currently she feels the left knee is  slightly worse than the right knee.  X-rays reveal bone-on-bone medial  compartments with extensive loose bodies throughout the joint.   ALLERGIES:  No known drug allergies.   CURRENT MEDICATIONS:  1. Nifedipine 900 mg p.o. daily.  2. Triamterene/hydrochlorothiazide 37.5/25 mg p.o. daily.  3. Percocet 1 pill 3 times daily.  4. Desipramine 100 mg p.o. q.h.s.  5. Albuterol MDI 2 puffs q.6h.  6. Urocit-K-K 1080 mg 2 tabs p.o. daily.   PRESENT MEDICAL HISTORY:  1. Hypertension.  2. Asthma triggered by smoke, aerosol sprays, and exercise.  3. Migraines.  4. Obesity.  5. History of kidney stones.   PAST SURGICAL HISTORY:  1. Cholecystectomy.  2. Kidney stone removal.  3. The patient denies any complications due to the  above-mentioned surgical     procedures.   SOCIAL HISTORY:  The patient is a 60 year old black female, morbidly obese,  denies any history of smoking or alcohol use.  She is married.  She lives in  a ground-floor apartment.  She has grown children.  She is currently disable  due to her knees.   FAMILY PHYSICIAN:  Dr. Pearlean Brownie.   FAMILY MEDICAL HISTORY:  Mother is deceased from a stroke at 48.  Father is  deceased from an MI at 26.  The patient has one brother deceased from a  brain tumor at 24.  Three brothers alive, one with hypertension, two others  in good health.  Five sisters with a combination of diabetes and  hypertension.   REVIEW OF SYSTEMS:  Positive for her last asthma attack about one year  previous.  She recently had a kidney stone this last April delaying previous  joint replacement surgery.  No recurrent issues.  The rest of the review of  systems was negative other than the complaints mentioned above related to  her knees.   PHYSICAL EXAMINATION:  VITAL SIGNS:  Height 5 feet 2 inches.  Weight 260  pounds.  Blood pressure is 126/80, pulse 96, respirations 14, nonlabored.  The patient is afebrile.  GENERAL:  This is a 59 year old black female, significantly obese, walking  with a cane in her right hand.  She has significant difficulty getting in  and out of the chair and on and off the exam table due to significant  difficulty with range of motion of bilateral knees and knee pain.  HEENT:  Head was normocephalic.  Pupils equal, round, and reactive.  Extraocular movements intact.  Sclerae was nonicteric.  External ears  without deformity.  Gross hearing is intact.  Her oral buccal mucosa was  pink and moist without lesions.  NECK:  Supple.  No palpable lymphadenopathy.  Thyroid region was nontender.  The patient had good range of motion of her cervical spine without any  difficulty or tenderness.  No tenderness with percussion along the entire  spinal  column.  CHEST:  Lung sounds were clear and equal bilaterally.  No wheezes, rales,  rhonchi, or rubs noted.  HEART:  Rapid, regular, no murmurs, rubs, or gallops.  ABDOMEN:  Round, obese.  No hepatosplenomegaly are palpable due to obesity.  Bowel sounds were normoactive.  She had no tenderness in the CVA region.  EXTREMITIES:  Upper extremities were symmetrically size and shape.  She had  fairly good range of motion of her shoulders, elbows, and wrists.  Motor  strength is 5/5.  LOWER EXTREMITIES:  Right and left hip had full extension.  She was able to  flex them up to only about 100 degrees, limited by knee pain.  She had about  20 degrees of internal and external rotation without any difficulty.  Bilateral knees were symmetric in size and shape.  No signs of erythema or  ecchymoses.  No palpable effusions.  The patient was significantly tender  throughout the entire joint with palpation on both knees.  She had coarse  crepitus with varus valgus laxity.  She had near full extension and flexion  only back to 90 degrees with both knees.  It was extremely uncomfortable.  Calves were nontender.  Ankles were symmetrical with good dorsoplantar  flexion.  PERIPHERAL VASCULAR:  Carotid pulses were 2+, no bruits.  Radial pulses were  2+.  Unable to palpate dorsalis pedis or posterior tibial pulses due to  slightly greater than 2+ pitting edema.  She had no venous stasis changes,  no pigmentation changes, and her lower extremities were warm and blanched  __________.  NEUROLOGIC:  The patient was conscious, alert, and appropriate.  Easy  conversation with examiner.  Cranial nerves 2-12 were grossly intact.  She  had no gross neurologic defects noted.  BREASTS/RECTAL/GU:  Deferred at this time.   IMPRESSION:  1. End-stage osteoarthritis of bilateral knees, currently the left more     symptomatic than right.  2. Hypertension.  3. Asthma.  4. Morbid obesity. 5. History of migraines.  6.  History of kidney stones.   PLAN:  The patient will undergo all routine labs and test prior to having a  left total knee arthroplasty by Dr. Priscille Kluver at [facility] on  August 24,  2005Jamelle Rushing, P.A.                      John L. Priscille Kluver, M.D.    RWK/MEDQ  D:  07/15/2004  T:  07/15/2004  Job:  [id number]

## 2011-04-16 NOTE — Discharge Summary (Signed)
NAME:  [name], [name] NO.:  [id number]   MEDICAL RECORD NO.:  [id number]                   PATIENT TYPE:  IPS   LOCATION:  4038                                 FACILITY:  [facility]   PHYSICIAN:  Erick Colace, M.D.           DATE OF BIRTH:  [birth year]   DATE OF ADMISSION:  07/24/2004  DATE OF DISCHARGE:  07/31/2004                                 DISCHARGE SUMMARY   HISTORY OF PRESENT ILLNESS:  [name] is a 60 year old female with history  of hypertension, obesity, bilateral knee pain with radiation to lower  extremities, left greater than right, status post left total knee  replacement on August 24, by Dr. Priscille Kluver.  Postoperatively, she is  weightbearing as tolerated and has been on Arixtra for DVT prophylaxis.  Therapies were initiated past surgery.  The patient was maximum assist for  bed mobility, moderate assist for transfers, minimum assist for ambulating  12 feet with rolling walker.   PAST MEDICAL HISTORY:  1.  End-stage OA in bilateral knees.  2.  Hypertension.  3.  Asthma.  4.  GERD.  5.  Eczema.  6.  Obesity.  7.  Renal calculi.  8.  Bilateral tubal ligation.  9.  Cholecystectomy.   ALLERGIES:  No known drug allergies.   SOCIAL HISTORY:  The patient is married.  Independent prior to admission.  She does not use any tobacco or alcohol.   HOSPITAL COURSE:  [name] was admitted to rehabilitation on July 24, 2004, with inpatient therapies to consist of PT and OT daily.  Past  admission, she was maintained on Arixtra for DVT prophylaxis.  Labs were  done past admission to follow up on postop anemia and H&H was noted to be  stable at 9.2 and 27.2.  White count was elevated at 6.1.  Check of  electrolytes showed mild hyponatremia.  Sodium was 131, potassium 4.0,  chloride 90, CO2 21, BUN 7, creatinine 0.8, glucose 115.  UA with C&S was  sent off.  The patient has had issues with nausea and constipation during  this  stay.  She was receiving multiple laxatives with improvement of  constipation.  She did continue with intermittent nausea.  Pepcid was  started on a b.i.d. basis with plus or minus improvement.  She had a  question of UTI.  Urine cultures were greater than 100,000 colonies of  Morganella and the patient was started on Cipro b.i.d. for this.  Her GI  symptoms did not improve past initiation of treatment.  At the time of  discharge, iron is discontinued questioning if this would be causing GI  distress.  She is encouraged to use Pepcid b.i.d. for now and to continue  with strict bowel program to avoid constipation.   The patient's knee incision is healing well without any signs or symptoms of  infection or raised erythema noted.  Staples remain intact  and the patient  is to follow up with Dr. Priscille Kluver next week with staple removal.  Of note,  the patient was noted to have a HIV test that was indeterminate on testing.  The patient reported no use of illicit drug use or suspicion of HIV,  infection or concern.  She reported a HIV panel was drawn routinely by Dr.  Priscille Kluver.  ID was consulted regarding following up on abnormal tests.  They  recommended growing HIV viral load or waiting and repeating labs in 3  months.  The patient elected to have HIV viral load drawn with results of  this pending on discharge.  During her stay in rehabilitation, [name]  progressed along well.  At the time of discharge, she was modified  independent for ADLs including toileting.  She was at supervision for  kitchen activity.  She was modified independent for transfers, modified  independent for ambulating 30+ feet with rolling walker.  Knee flexion was  at 74 degrees.  Her home health CPM is ordered to help with range of motion.  Further followup therapies to include home health PT/OT therapy services by  [facility].  On July 31, 2004, the patient is  discharged to home.   DISCHARGE  MEDICATIONS:  1.  Cipro 250 mg b.i.d.  2.  Procardia XL 90 mg a day.  3.  Maxzide one per day.  4.  OxyContin CR 40 mg b.i.d. x1 week, then one per day x1 week, then      discontinue.  5.  Pepcid 20 mg b.i.d.  6.  Tylenol 1000 mg q.i.d.  7.  Dulcolax tablets 2 p.o. q.d.  8.  Oxycodone IR 5-10 mg q.4-6h. p.r.n. pain.   ACTIVITY:  Use walker.   WOUND CARE:  Keep area clean and dry.   SPECIAL INSTRUCTIONS:  No alcohol, no smoking or driving.  CPM x4-6h. per  day.   FOLLOW UP:  The patient to follow up with Dr. Priscille Kluver in 5 days for staple  removal.  Follow up with Dr. Wynn Banker as needed.      Greg Cutter, P.A.                    Erick Colace, M.D.    PP/MEDQ  D:  07/31/2004  T:  07/31/2004  Job:  [id number]

## 2011-04-16 NOTE — Op Note (Signed)
[name], [name].:  [id number]   MEDICAL RECORD Smith.:  [id number]          PATIENT TYPE:  INP   LOCATION:  0002                         FACILITY:  [facility]   PHYSICIAN:  [name], M.D.  DATE OF BIRTH:  1951-05-28   DATE OF PROCEDURE:  05/10/2005  DATE OF DISCHARGE:                                 OPERATIVE REPORT   PREOPERATIVE DIAGNOSIS:  Osteoarthritis, right knee.   SURGICAL PROCEDURES:  Right LCS total knee replacement with computer  navigation assistance.   POSTOPERATIVE DIAGNOSIS:  Osteoarthritis, right knee.   SURGEON:  Dr. Priscille Kluver.   ASSISTANT:  Randel Pigg, [facility]   ANESTHESIA:  General.   PATHOLOGY:  Bone against bone medial compartment with end stage changes  throughout the remainder of the knee, large loose bodies in the posterior  recesses and attached to the patella and then the suprapatellar pouch.   PROCEDURE:  Under general anesthesia, the right leg was prepared with  DuraPrep and draped as a sterile field.  A sterile tourniquet was placed on  the proximal thigh; leg is wrapped out with the Esmarch, and the tourniquet  is used to 350 mm.  Midline incision is made.  The patella is everted.  Femur is sized at approximately a standard.  Debridement is done in  preparation for computer navigation.  Schanz pins are placed in the mid  tibia, proximal third, and distal third femur medially.  Computer mapping  was then done, finding first the femoral head, medial and lateral malleoli.  The proximal tibia is then mapped; the distal femur is then mapped and once  this is completed, it should be noted that the knee was in 4 degrees of  varus, at 6-degree flexion contracture.  At this point, the proximal tibial  resection was carried out and this done within 1 degree of anatomical axis.  The tensioner is then inserted, and tension is fixed within 1 degree of  anatomic axis.  Anterior and posterior flare of the distal femur then  resected with  computer nav.  Distal femur with computer nav and recessing  guide are all done.  At this point, lamina spreader is inserted.  Remnants  of the menisci and cruciates are debrided.  Spacer block is tested, and the  knee is loosened slightly from original plan of a 10 to a 12.5.  At this  point, the proximal tibia is exposed.  Center peg hole with keel is done,  and trial reduction of a 2.5 tibia, 12.5 bearing, and standard femur reveal  excellent fit, alignment, but with slight laxity in stability, and a 15  bearing gets it within 1 degree of anatomic axis, and full extension was to  plus 1 degree.  Following this, permanent components are obtained.  The knee  is cemented in place.  Once cement is  hardened, excess is removed.  Schanz pins are removed.  The tourniquet is  let down at 1 hour and 10 minutes.  Multiple small vessels are cauterized.  The knee is then closed in layers with #1 Tycron, 0 and 2-0 Vicryl, and  skin  clips.  The patient returned to recovery in good condition.       JLR/MEDQ  D:  05/10/2005  T:  05/10/2005  Job:  [id number]

## 2011-04-16 NOTE — Op Note (Signed)
[name], [name] NO.:  [id number]   MEDICAL RECORD NO.:  [id number]          PATIENT TYPE:  AMB   LOCATION:  DAY                          FACILITY:  [facility]   PHYSICIAN:  Valetta Fuller, M.D.  DATE OF BIRTH:  13-Aug-1951   DATE OF PROCEDURE:  01/04/2007  DATE OF DISCHARGE:                               OPERATIVE REPORT   PREOPERATIVE DIAGNOSES:  1. Severe right hydronephrosis.  2. Right distal ureteral calculus 10 mm.   POSTOPERATIVE DIAGNOSES:  1. Severe right hydronephrosis.  2. Right distal ureteral calculus 10 mm.   PROCEDURE PERFORMED:  Cystoscopy, right retrograde pyelography, right  ureteroscopy with holmium laser lithotripsy, basketing of the stone  fragments and double-J stent placement.   SURGEON:  Valetta Fuller, M.D.   ANESTHESIA:  General.   INDICATIONS:  [name] has a history of nephrolithiasis.  She has had  uric acid stones in the past and is also suspected of possibly having  calcium oxalate stone disease.  When we saw her 6 months ago, she was  known to have a 9-10 mm stone the lower pole for right kidney.  There is  no evidence of obstruction.  She was asymptomatic at that time.  We felt  that continued observation was reasonable.  The patient presented  recently for routine follow-up and was actually relatively asymptomatic.  An ultrasound, however, showed the stone was no longer in her kidney and  she had severe right sided hydronephrosis.  Stone protocol CT confirmed  our suspicions and indeed, the 9-10 mm stone was now in her distal  ureter causing high-grade obstruction with a markedly dilated ureter and  collecting system.  It was difficult to predict how long that stone had  been present, but we felt that definitive intervention was definitely  indicated at this point.  We recommended ureteroscopy with holmium  laser.  The patient appeared to understand the advantages, disadvantages  and rationale for that procedure.   TECHNIQUE AND FINDINGS:  The patient was brought to the operating room  where she had successful induction of general anesthesia.  She was  placed in lithotomy position and prepped and draped in the usual manner.  She had been taking oral antibiotics once a day and also received IV  Cipro.  Cystoscopy was unremarkable.   Attention was turned towards right pyelography.  This was done  retrograde with an 8-French cone-tip catheter with fluoroscopic  visualization.  The patient was noted to have a 9-10 mm filling defect  causing high-grade obstruction in the distal ureter with a markedly  dilated ureter and collecting system proximal to the stone.   A guidewire was placed up to the right renal pelvis.  The distal ureter  was then easily engaged with a short 6-French ureteroscope.  A large  impacted stone was encountered and holmium laser lithotriptor was used  to break this into approximately 20 fragments.  Each piece over 2-3 mm  was then basket extracted with a nitinol basket.  Because of  the need for the manipulation and the edema of the distal ureter.  We  felt double-J stent was indicated.  We ended up placing a 5-French 24 cm  Polaris stent with fluoroscopic as well as visual guidance.  The patient  appeared to tolerate the procedure well, there were no obvious  complications.           ______________________________  Valetta Fuller, M.D.  Electronically Signed     DSG/MEDQ  D:  01/04/2007  T:  01/04/2007  Job:  [id number]   cc:   [facility]

## 2011-04-16 NOTE — H&P (Signed)
[name], [name] NO.:  [id number]   MEDICAL RECORD NO.:  [id number]          PATIENT TYPE:  ORB   LOCATION:  4522                         FACILITY:  [facility]   PHYSICIAN:  Erick Colace, M.D.DATE OF BIRTH:  1951/01/30   DATE OF ADMISSION:  05/13/2005  DATE OF DISCHARGE:                                HISTORY & PHYSICAL   CHIEF COMPLAINT:  Right knee pain, status post right total knee replacement  with decreased mobility ambulation.   HISTORY OF PRESENT ILLNESS:  This 60 year old black female with history of  left TKR which resulted in a CIR admission August of 2005. She was admitted  at [facility] on May 10, 2005 with end-stage changes in the right  knee. She had no relief with conservative care. She underwent right TKR on  May 10, 2005 per Dr. Jonny Ruiz L. Rendall. She was placed on Arixtra for DVT  prophylaxis and made weightbearing as tolerated. Postoperative pain control  was with the PCA which was discontinued on May 11, 2005. ID saw the patient  for an evaluation of an indeterminate HIV blood test. This was felt to be  secondary to cross reacting antibodies and no further workup was indicated  per Dr. Cliffton Asters. Anemia was noted with a hemoglobin of 8.4 and this  has been monitored. Since coming off the [facility], she has OxyContin CR 20 mg  p.o. q.12h. added for pain control.   REVIEW OF SYMPTOMS:  Positive for joint swelling and positive for anxiety.   PAST MEDICAL HISTORY:  1.  Hypertension.  2.  Asthma.  3.  Migraine headache.  4.  Kidney stone.  5.  Left TKA.  6.  Cholecystectomy.  7.  Tubal ligation.  8.  Keloid.  9.  Anxiety.   FAMILY HISTORY:  Noncontributory.   SOCIAL HISTORY:  Lives with her husband in [place]. Husband is on  disability. Can assist. Son is in the area and can check up as needed. The  patient is retired. The patient is retired.   FUNCTIONAL HISTORY:  Independent prior to admission. Functional status  is  monitored assist to bed mobility and transfers.   MEDICATIONS:  1.  Nifedipine 90 mg p.o. daily.  2.  Triamterene/hydrochlorothiazide 37.5/25 mg.  3.  Desipramine 50 mg p.o. q.h.s.  4.  Urocit 1080 mg 2 p.o. b.i.d.  5.  Albuterol inhaler 2 puffs q.6h. p.r.n.   LABORATORY DATA:  Last white count was 20.3. UA on May 12, 2005 showed a  white count of white blood cells 11 to 20, BUN 6, creatinine 0.9. Her  potassium was 3.6.   PHYSICAL EXAMINATION:  GENERAL:  An obese female in no acute distress. Mood  and affect are mildly anxious but otherwise appropriate.  VITAL SIGNS:  Blood pressure 109/60, pulse 100, temperature is 97.8,  respirations 18. Her O2 saturation is 91% on room air.  GENERAL:  In no acute distress.  HEENT:  Eyes are anicteric and not injected. ENT is normal.  NECK:  Supple without adenopathy.  LUNGS:  Respiratory effort is good.  HEART:  Regular rate and rhythm. No murmurs, gallops, or rubs.  ABDOMEN:  Positive bowel sounds. Nontender to palpation.  EXTREMITIES:  Trace edema bilateral ankles.  MUSCULOSKELETAL:  Motor strength is 4/5 bilateral upper extremities in the  deltoid, biceps, triceps, finger flexors, 3- on the right hip flexor, 3- on  the quadriceps, 4 at the PA and gastrocnemius, 4 throughout on the left  lower extremity. Orientation x3.   IMPRESSION:  1.  Right total knee replacement May 10, 2005 postoperative day two.  2.  History of osteoarthritis right knee as well as previous left total knee      replacement August of 2005.  3.  Pain management with OxyContin CR 20 mg p.o. q.12h. as well as Oxy IR      p.r.n.  4.  Deep vein thrombosis prophylaxis with Arixtra daily.  5.  Anemia:  Follow up with complete blood count as well as elevated white      count in the morning.  6.  Hypertension:  Continue Procardia, Triamterene, hydrochlorothiazide.  7.  Asthma:  Continue albuterol inhaler.  8.  Anxiety:  Monitor.   PLAN:  Estimated length of stay is  7 to 10 days. The patient is a good [employer]AC[employer]  candidate.       AEK/MEDQ  D:  05/13/2005  T:  05/14/2005  Job:  [id number]   cc:   Jonny Ruiz L. Rendall, M.D.  Fax: [phone]   Pearlean Brownie, M.D.  Fax: [phone]

## 2011-04-16 NOTE — Discharge Summary (Signed)
Kirsten, Smith                ACCOUNT NO.:  [id number]   MEDICAL RECORD NO.:  [id number]          PATIENT TYPE:  INP   LOCATION:  1513                         FACILITY:  [facility]   PHYSICIAN:  John L. Rendall, M.D.  DATE OF BIRTH:  1950/12/10   DATE OF ADMISSION:  05/10/2005  DATE OF DISCHARGE:                                 DISCHARGE SUMMARY   ADMISSION DIAGNOSES:  1.  End-stage osteoarthritis right knee.  2.  Hypertension.  3.  History of asthma.  4.  History of obesity.  5.  History of migraines.  6.  History of kidney stones.  7.  Frequent urinary tract infections.  8.  History of left total knee arthroplasty.  9.  History of indeterminate human immunodeficiency virus positive testing.   DISCHARGE DIAGNOSES:  1.  Right total knee arthroplasty.  2.  Postoperative blood loss anemia.  3.  Leukocytosis, similar event with previous total knee arthroplasty. The      patient is asymptomatic but found with past total knee arthroplasty a      Morganella urinary tract infection. Urine cultures pending. Will be      placed on prophylactic Cipro.  4.  Repeat indeterminate human immunodeficiency virus testing felt to be      possibly a false positive by infectious disease; viral load testing      pending.  5.  History of hypertension, normalized blood pressure status post surgery.      Blood pressure medications on hold.  6.  History of asthma.  7.  History of migraines.  8.  History of kidney stones.   ALLERGIES:  No known drug allergies.   CURRENT MEDICATIONS:  1.  Nifedipine ER 90 mg p.o. daily.  2.  Triamterene/hydrochlorothiazide 37.5/25 mg p.o. daily.  3.  Vicodin 5 mg p.r.n.  4.  Desipramine 50 mg p.o. q.h.s.  5.  Urocit-K 1080 mg two tablets p.o. b.i.d.  6.  Albuterol inhaler two puffs q.6h. p.r.n.   SURGICAL PROCEDURE:  On May 10, 2005 the patient was taken to the OR by Dr.  Erasmo Leventhal, assisted by Oneida Alar, P.A.-C. under general anesthesia and  underwent a  right LCS total knee arthroplasty with computer navigation  assistance. The patient tolerated the procedure well, there were no  complications. The patient had the following components implanted: A size  standard right femoral component, a size 2.5 keeled tibial tray, a standard  patella, and a size 15 polyethelene bearing. All components were implanted  with polymethylmethacrylate. The patient tolerated the procedure well. There  were no complications.   CONSULTATIONS:  The following routine consults requested:  Physical therapy,  occupational therapy, case management.   An infectious disease consult was requested due to a preoperative laboratory  test indicating indeterminate reactivity of HIV status.   HOSPITAL COURSE:  On May 10, 2005 the patient was admitted to [facility] under the care of Dr. Jonny Ruiz. Rendall. The patient was taken to the  OR where a right total knee arthroplasty with computer navigation was  performed without any complications. The patient tolerated the  procedure  well, was placed in the CPM in the recovery room, was transferred to the  orthopedic floor with IV antibiotics and pain medications and total knee  protocol. The patient was placed on Arixtra for DVT prophylaxis.   The patient then incurred a total of 3 days postoperative care on the  orthopedic floor in which the patient did develop some postoperative blood  loss anemia but she had well maintained systolic blood pressures so the  patient was not transfused any blood. The patient's blood pressure  medication was placed on hold for systolic blood pressures in the 110s. The  patient also developed a leukocytosis of 20.3. She remained asymptomatic,  afebrile, no complaints. Reviewing the chart, the patient did something very  similar with the last total knee arthroplasty. She was found to have a  urinary tract infection with Morganella. Urinalysis at this time just showed  few yeast, rare  bacteria, so a culture was sent off. The patient was placed  on Cipro prophylactically and one dose of Diflucan.   The patient's wound remained benign for any signs of infection and her leg  remained neuromotor vascularly intact. She worked well with physical therapy  but did require rehab with the previous total hip arthroplasty prior to  being discharged home, so she was evaluated by rehab services and she was  accepted on that service on postoperative day #3 and transferred to that  unit in good condition.   LABORATORY DATA:  CBC on June 15:  Wbc's 20.3, hemoglobin 8.4, hematocrit  25.4, platelets 286. Routine chemistries on June 14:  Sodium of 134,  potassium of 3.6, BUN 6, creatinine 0.9, glucose 167. The patient's glucose  was 106 on admission, felt to be due to increased stress postoperative and  inactivity, would be monitored on an outpatient basis. No changes to  treatment regime. HIV testing on June 7 showed reactive screening exam with  a Western Blot pattern of indeterminate weak positive. Chest x-ray showed on  admission no evidence of acute cardiopulmonary disease. Urinalysis on June  14 found cloudy appearance, small blood, large leukocytes, many epithelial  cells, rare bacteria, and rare yeast - culture pending.   MEDICATIONS ON DISCHARGE TO REHABILITATION SERVICES:  1.  Colace 100 mg p.o. b.i.d.  2.  Trinsicon one tablet p.o. t.i.d.  3.  Arixtra 2.5 mg subcu for a total of 10 days - today is #3.  4.  Procardia 90 mg p.o. daily - on hold.  5.  Triamterene/hydrochlorothiazide 37.5/25 mg p.o. daily - on hold.  6.  Norpramin 50 mg p.o. q.h.s.  7.  Albuterol metered dose inhaler two puffs q.6h. p.r.n.  8.  Urocit-K 1080 mg p.o. b.i.d.  9.  OxyContin CR 20 mg p.o. q.12h.  10. OxyIR 5 mg one or two tablets q.4-6h.  11. Tylenol 650 mg p.o. q.4-6h. p.r.n.  12. Phenergan 25 mg p.o. q.6h. p.r.n.  13. Restoril 30 mg p.o. q.h.s. p.r.n. 14. Robaxin 500 mg p.o. q.6h. p.r.n.    DISCHARGE INSTRUCTIONS:  1.  Medications:      1.  Continue medications as dispensed on the ortho floor.      2.  Will add Cipro 500 mg p.o. b.i.d.      3.  Diflucan 100 mg p.o. daily x1 day.  2.  Activity:  The patient may be weightbearing as tolerated with the use of      a walker and supervision with physical therapy.  3.  Diet:  The  patient needs a low concentrated sweet diet.  4.  Wound care:  The patient should have wound checked daily for any signs      of infection. Dressing changed daily. TEDs on lower extremities on a day-      to-day basis when out of bed.  5.  Follow-up:  The patient needs a follow-up appointment with Dr. Priscille Kluver 1      week from discharge from rehab services.  6.  A culture was sent and needs to be sent for appropriate antibiotic use.   PATIENT CONDITION UPON DISCHARGE TO REHABILITATION SERVICES:  Listed as  good.       RWK/MEDQ  D:  05/13/2005  T:  05/13/2005  Job:  [id number]

## 2011-04-16 NOTE — Op Note (Signed)
NAME:  [name], [name]                          ACCOUNT NO.:  [id number]   MEDICAL RECORD NO.:  [id number]                   PATIENT TYPE:  INP   LOCATION:  0484                                 FACILITY:  [facility]   PHYSICIAN:  John L. Rendall III, M.D.           DATE OF BIRTH:  23-Oct-1951   DATE OF PROCEDURE:  07/22/2004  DATE OF DISCHARGE:                                 OPERATIVE REPORT   PREOPERATIVE DIAGNOSIS:  End-stage osteoarthritis, left knee.   SURGICAL PROCEDURE:  Computer navigation assisted left total knee  replacement.   POSTOPERATIVE DIAGNOSIS:  End-stage osteoarthritis, left knee.   SURGEON:  John L. Rendall, M.D.   ASSISTANT:  Legrand Pitts. Duffy, PA-C   ANESTHESIA:  General.   PATHOLOGY:  The patient has a varus knee with bone-against-bone.  She is a  heavy woman with a large thigh.  Difficult to feel and see landmarks in this  knee.   PROCEDURE:  Under general anesthesia with a femoral nerve block, the left  leg is prepared with DuraPrep and draped as a sterile field.  Sterile  tourniquet is applied on the proximal thigh.  Leg is wrapped out with the  Esmarch, and the tourniquet is used to 350 mm.  Midline incision is made;  the patella is everted.  The femur is sized at approximately a standard.  Great care is taken to remove spurs from the tibia and femoral condyles.  Two Schanz pins are then inserted into the anteromedial tibia in the  proximal third and two Schanz pins in the distal third of the femur.  With  these in place, the navigation apparatus is attached and aimed at the camera  for a computer-assisted navigation.  With this in place, the leg is  manipulated gently through a range of motion until the computer captured the  center of the femoral head.  Once this is done, computer mapping is done  with medial malleolus, lateral malleolus, anterior tibial spine, behind the  ACL, front to the tibia, depth of the medial tibial plateau, lateral tibial  plateau, medial side of the tibia, lateral side of the tibia, and posterior  tibial and the midline of the tibia.  Femoral markings are then done on the  intercondylar notch, front of the distal femur, medial epicondyle, lateral  epicondyle, and over the course of both condyles, extending back  posteriorly.  With the femur and tibia marked to 0.3 mm, the alignment to  the knee is in 4 degrees of varus and using the tibial guide with  navigation, proximal tibial resection is carried out, removing from the high  side an approximately 8.5 mm wafer.  With this removed, the tensioner device  is inserted, and medial and lateral compartments are tensioned equally in  both flexion and extension.  The computer maps the position of these, and  then attention is turned to the first femoral guide for resection.  The  anterior and posterior flare of the distal femur are resected followed by a  distal femoral cut, followed by the recessing guide.  The tibia is then  exposed and center peg hole with fins is applied.  Then trial seating of a  2.5 tibia with 15 bearing and standard femur reveals excellent fit,  alignment, and stability.  Patella is osteotomized and three peg holes  inserted.  Trial reduction reveals excellent fit, alignment, and stability.  At this point, with excellent fit of all trial components, permanent  components are obtained.  Bony surfaces are prepared with pulse irrigation.  Permanent components are then cemented in place and while cement hardens, a  synovectomy is carried out.  Once this is completed, the tourniquet is let  down at an hour and 33 minutes.  Multiple small vessels are cauterized.  A  medium Hemovac drain is inserted.  Excess cement is removed.  The knee is  then closed in layers with #1 Ethibond, 0 and 2-0 Vicryl and skin clips.  The case took right at 2 hours, and computer navigation elongated the case  by approximately 30 minutes,  but final product was within 1  degree of planned longitudinal weightbearing  alignment, and tension of the flexion and extension gaps was totally  balanced.  The patient tolerated the procedure well and returned to recovery  in good condition.  One Hemovac drain.                                               John L. Dorothyann Gibbs, M.D.    Renato Gails  D:  07/22/2004  T:  07/22/2004  Job:  [id number]

## 2011-04-16 NOTE — Op Note (Signed)
NAME:  [name], [name] NO.:  [id number]   MEDICAL RECORD NO.:  [id number]                   PATIENT TYPE:  AMB   LOCATION:  NESC                                 FACILITY:  [facility]   PHYSICIAN:  Valetta Fuller, M.D.               DATE OF BIRTH:  [birth year]   DATE OF PROCEDURE:  DATE OF DISCHARGE:                                 OPERATIVE REPORT   PREOPERATIVE DIAGNOSES:  Right renal calculus.   POSTOPERATIVE DIAGNOSES:  Right renal calculus.   PROCEDURE:  Cystoscopy with double J stent removal, retrograde pyelography,  flexible and rigid ureteroscopy, holmium laser lithotripsy, stone fragment  basketing, double J stent placement.   SURGEON:  Valetta Fuller, M.D.   ASSISTANT:  Thyra Breed, M.D.   ANESTHESIA:  General.   INDICATIONS FOR PROCEDURE:  [name] is a 60 year old female. She  presented several months ago with severe right renal colic and was diagnosed  with a 12 mm stone at her right renal pelvis/ureteropelvic junction.  She  had placement of a double J stent to temporize her situation. Her urine pH  was 5 and we could not see the stone on KUB. We suspected this might be uric  acid. We proceeded with lithotripsy and the stone broke up partially. We  also put her on urinary alkalinization and her stone decreased in size but  remained about 9 mm. Despite several months of urinary alkalinization and  objective evidence that her urine pH had increased, the stone did not  decrease in size.  She has continued to have a double J stent indwelling. We  felt that if the stent were removed, she had a high likelihood of having  recurrent migration of the stone back into the ureter. For that reason, we  recommended definitive therapy with ureteroscopy today. The patient appeared  to understand the advantages and disadvantages and gave full informed  consent.   TECHNIQUE:  The patient was brought to the operating room where she had  successful  induction of general anesthesia. She was placed in lithotomy  position and prepped and draped in the usual manner. Cystoscopy revealed the  stent to be in good position with some slight encrustation.  It was  partially removed and a guidewire was placed up to the renal pelvis.  Retrograde pyelogram showed no obvious stones within the ureter. An access  sheath was placed and a flexible ureteroscope was inserted. We encountered  the stone in a lower pole calix. We were able to visualize the stone but  because of its location it was difficult to apply the laser fiber on the  stone. For that reason, a basket was used to pull the stone down into the  renal pelvis.  Once in the renal pelvis, the stone was partially fragmented.  We then utilized the nitinol basket to bring some fragments down the ureter.  One of the  fragments got stuck in the distal ureter and therefore the basket  was removed. Rigid ureteroscopy was used to fracture the stone into multiple  small pieces which were then basket extracted. All the large pieces were  removed and all that was  remaining were some 1 mm or smaller fragments.  At the completion of the  procedure, a 6 French 24 cm double J stent was placed. This appeared to be  in good position.  There were no obvious complications or difficulties and  the patient appeared to tolerate the procedure well.                                               Valetta Fuller, M.D.    DSG/MEDQ  D:  06/03/2004  T:  06/03/2004  Job:  [id number]

## 2011-04-16 NOTE — Discharge Summary (Signed)
[name], AICHER NO.:  [id number]   MEDICAL RECORD NO.:  [id number]          PATIENT TYPE:  ORB   LOCATION:  4522                         FACILITY:  [facility]   PHYSICIAN:  Ellwood Dense, M.D.   DATE OF BIRTH:  Oct 13, 1951   DATE OF ADMISSION:  05/13/2005  DATE OF DISCHARGE:  05/20/2005                                 DISCHARGE SUMMARY   DISCHARGE DIAGNOSES:  1.  Right total knee arthroplasty May 10, 2005 secondary to degenerative      joint disease.  2.  Pain management.  3.  Subcutaneous Arixtra for deep venous thrombosis prophylaxis.  4.  Postoperative anemia.  5.  Hypertension.  6.  Asthma.  7.  Left total knee replacement August 2005.  8.  Anxiety.   HISTORY OF PRESENT ILLNESS:  The patient is a 60 year old black female with  history of left total knee replacement August 2005 which she received  inpatient rehabilitation services.  She was admitted to [facility]  on May 10, 2005 with end-stage changes of the right knee and no relief with  conservative care.  Patient underwent a right total knee arthroplasty May 10, 2005 per Dr. Erasmo Leventhal.  She was placed on Arixtra for deep venous  thrombosis prophylaxis, weight-bearing as tolerated.  Postoperative pain  control, PCA discontinued on May 11, 2005.  Infectious disease follow up  for evaluation of an indeterminate HIV Western Blot test felt to be  secondary to cross-reacting antibiotics.  No further work up indicated,  followed by infectious disease, Dr. Cliffton Asters.  Postoperative anemia 8.4  and monitored.  She was admitted to subacute care services.   PAST MEDICAL HISTORY:  See discharge diagnoses.   ALLERGIES:  None.   SOCIAL HISTORY:  Lives with husband in [place].  Husband on disability  but can assist.  Son also in the area to check as needed.  Patient is  retired.   MEDICATIONS PRIOR TO ADMISSION:  1.  Procardia 90 mg daily.  2.  Maxzide daily.  3.  Desipramine 50 mg  at bedtime.  4.  Urocrit twice daily.  5.  Albuterol inhaler as needed.   HOSPITAL COURSE:  Patient with progressive gains while on rehabilitation  services with therapies initiated on a daily basis.  The following issues  were followed during the patient's rehabilitation stay.  Concerning Mrs.  Carlo's right total knee arthroplasty the surgical site is healing nicely.  No signs of infection.  She was weight-bearing as tolerated.  Neurovascular  sensation intact.  She was ambulating supervision level with home health  therapies arranged.  Pain management ongoing with the use of OxyContin,  sustained release that was tapered at discharge and Oxycodone for break  through pain.  She had completed her course of subcutaneous Arixtra for deep  venous thrombosis prophylaxis.  Her blood pressures were controlled with  home regimen of Procardia and Maxzide.  It was noted during preoperative  studies followed by infectious disease Dr. Orvan Falconer of an indeterminate HIV  Western Blot test.  Follow up per Dr. Orvan Falconer, latest being May 17, 2005,  as expected HIV viral load less than 50 which was undetectable, which did  help support that her testing was indeterminate on the Western Blot test and  she did not have HIV infection.   CLINICAL DATA:  Latest labs show sodium 134, potassium 3.9, BUN 6,  creatinine 0.7, hemoglobin 8.5, hematocrit 25.7, platelet count 312,000.   DISCHARGE MEDICATIONS:  At the time of dictation included:  1.  Albuterol inhaler 90 mcg two puffs every 6 hours.  2.  Desipramine 50 mg at bedtime.  3.  Urocrit twice daily.  4.  Trinsicon twice daily.  5.  Nifedipine 90 mg daily.  6.  Maxzide daily.  7.  Oxycodone as needed for pain.  8.  OxyContin sustained release 10 mg every 12h X1 week and then      discontinue.   FOLLOW UP:  Home health occupational therapy and physical therapy.  She  would follow up with Dr. Deirdre Priest for medical management, Dr. Priscille Kluver of  orthopedic  services.      Danie   DA/MEDQ  D:  05/19/2005  T:  05/19/2005  Job:  [id number]   cc:   Jonny Ruiz L. Rendall, M.D.  Fax: [phone]   Pearlean Brownie, M.D.  Fax: [phone]   Cliffton Asters, M.D.  [address] [place]  [place] [address]  Fax: [phone]

## 2011-04-29 ENCOUNTER — Other Ambulatory Visit: Payer: Self-pay | Admitting: Family Medicine

## 2011-04-30 NOTE — Telephone Encounter (Signed)
Refill request

## 2011-05-29 ENCOUNTER — Other Ambulatory Visit: Payer: Self-pay | Admitting: Family Medicine

## 2011-05-29 DIAGNOSIS — I10 Essential (primary) hypertension: Secondary | ICD-10-CM

## 2011-05-29 NOTE — Telephone Encounter (Signed)
Refill request

## 2011-06-30 ENCOUNTER — Encounter: Payer: Self-pay | Admitting: Family Medicine

## 2011-06-30 ENCOUNTER — Ambulatory Visit (INDEPENDENT_AMBULATORY_CARE_PROVIDER_SITE_OTHER): Payer: Medicare Other | Admitting: Family Medicine

## 2011-06-30 VITALS — BP 126/78 | HR 94 | Temp 98.6°F | Ht 62.0 in | Wt 244.5 lb

## 2011-06-30 DIAGNOSIS — I10 Essential (primary) hypertension: Secondary | ICD-10-CM

## 2011-06-30 DIAGNOSIS — J45909 Unspecified asthma, uncomplicated: Secondary | ICD-10-CM

## 2011-06-30 DIAGNOSIS — E785 Hyperlipidemia, unspecified: Secondary | ICD-10-CM

## 2011-06-30 DIAGNOSIS — E119 Type 2 diabetes mellitus without complications: Secondary | ICD-10-CM

## 2011-06-30 DIAGNOSIS — D649 Anemia, unspecified: Secondary | ICD-10-CM

## 2011-06-30 DIAGNOSIS — E669 Obesity, unspecified: Secondary | ICD-10-CM

## 2011-06-30 LAB — COMPREHENSIVE METABOLIC PANEL
ALT: 11 U/L (ref 0–35)
AST: 18 U/L (ref 0–37)
Albumin: 4.1 g/dL (ref 3.5–5.2)
CO2: 26 mEq/L (ref 19–32)
Calcium: 9.7 mg/dL (ref 8.4–10.5)
Chloride: 100 mEq/L (ref 96–112)
Potassium: 4.5 mEq/L (ref 3.5–5.3)
Sodium: 139 mEq/L (ref 135–145)
Total Protein: 7.6 g/dL (ref 6.0–8.3)

## 2011-06-30 LAB — CBC
MCV: 88.1 fL (ref 78.0–100.0)
Platelets: 271 10*3/uL (ref 150–400)
RBC: 4.3 MIL/uL (ref 3.87–5.11)
RDW: 14.9 % (ref 11.5–15.5)
WBC: 12.7 10*3/uL — ABNORMAL HIGH (ref 4.0–10.5)

## 2011-06-30 LAB — LIPID PANEL
HDL: 49 mg/dL (ref >39)
LDL Cholesterol: 66 mg/dL (ref 0–99)
Total CHOL/HDL Ratio: 3.7 Ratio
VLDL: 67 mg/dL — ABNORMAL HIGH (ref 0–40)

## 2011-06-30 LAB — POCT GLYCOSYLATED HEMOGLOBIN (HGB A1C): Hemoglobin A1C: 7.3

## 2011-06-30 MED ORDER — GLUCOSE BLOOD VI STRP: ORAL_STRIP | Status: DC

## 2011-06-30 NOTE — Assessment & Plan Note (Signed)
Lab Results  Component Value Date   CHOL 190 04/29/2010   CHOL 181 01/23/2009   CHOL 175 05/18/2007   Lab Results  Component Value Date   HDL 47 04/29/2010   HDL 48 01/23/2009   HDL 51 1/61/0960   Lab Results  Component Value Date   LDLCALC 82 04/29/2010   LDLCALC 88 01/23/2009   LDLCALC 99 05/18/2007   Lab Results  Component Value Date   TRIG 305* 04/29/2010   TRIG 224* 01/23/2009   TRIG 127 05/18/2007   Lab Results  Component Value Date   CHOLHDL 4.0 Ratio 04/29/2010   CHOLHDL 3.8 Ratio 01/23/2009   CHOLHDL 3.4 Ratio 05/18/2007   Needs FLP worry may be worse given lack of weight loss

## 2011-06-30 NOTE — Assessment & Plan Note (Signed)
Lab Results  Component Value Date   HGBA1C 7.3 06/30/2011   HGBA1C 6.5 11/04/2010   CREATININE 0.80 04/29/2010   CHOL 190 04/29/2010   HDL 47 04/29/2010   TRIG 305* 04/29/2010    Last eye exam and foot exam: No results found for this basename: HMDIABEYEEXA, HMDIABFOOTEX    Assessment: Diabetes control: controlled Progress toward goals: at goal Barriers to meeting goals: diet  Plan: Diabetes treatment: continue current medications Refer to: Health Coach Instruction/counseling given: reminded to get eye exam and discussed diet

## 2011-06-30 NOTE — Assessment & Plan Note (Signed)
Stable continue current medications

## 2011-06-30 NOTE — Assessment & Plan Note (Signed)
Lab Results  Component Value Date   NA 139 04/29/2010   K 4.2 04/29/2010   CL 100 04/29/2010   CO2 26 04/29/2010   BUN 20 04/29/2010   CREATININE 0.80 04/29/2010    BP Readings from Last 3 Encounters:  06/30/11 126/78  12/14/10 126/82  11/04/10 117/76    Assessment: Hypertension control:  controlled  Progress toward goals:  at goal Barriers to meeting goals:  None  Plan: Hypertension treatment:  continue current medications

## 2011-06-30 NOTE — Progress Notes (Signed)
  Subjective:    Patient ID: [name], female    DOB: 1951-06-21, 60 y.o.   MRN: [id number]  HPI  HYPERTENSION Disease Monitoring: Blood pressure range-not checking Chest pain, palpitations- no      Dyspnea- no  Medications: Compliance- reports regular use Lightheadedness,Syncope- no   Edema- no   DIABETES Disease Monitoring: Blood Sugar ranges-less than 200 Polyuria/phagia/dipsia- no      Visual problems- no  Medications: Compliance- good Hypoglycemic symptoms- no  Asthma Well controlled.  No severe attacks.  Using albuterol rarely.  Flovent uses most days   HYPERLIPIDEMIA Disease Monitoring: See symptoms for Hypertension  Medications: Compliance- not making progress on diet  Right upper quadrant pain- no  Muscle aches- no   ROS See HPI above   PMH Smoking Status noted      Review of Systems     Objective:   Physical Exam  Heart - Regular rate and rhythm.  No murmurs, gallops or rubs.    Lungs:  Normal respiratory effort, chest expands symmetrically. Lungs are clear to auscultation, no crackles or wheezes. Extremities:  No cyanosis, edema, or deformity noted with good range of motion of all major joints.         Assessment & Plan:

## 2011-06-30 NOTE — Assessment & Plan Note (Signed)
No progress.  Her husbands recent illness is her reason.  Suggest followup with our health coach

## 2011-06-30 NOTE — Patient Instructions (Signed)
I will have [name] call you for an appointment to work on you weight  We will schedule your for a colonoscopy  Check with your insurance about the shingles shot  I will call you if your tests are not good.  Otherwise I will send you a letter.  If you do not hear from me with in 2 weeks please call our office.

## 2011-07-01 ENCOUNTER — Encounter: Payer: Self-pay | Admitting: Family Medicine

## 2011-07-05 ENCOUNTER — Telehealth: Payer: Self-pay | Admitting: Home Health Services

## 2011-07-05 NOTE — Telephone Encounter (Signed)
Spoke with [employer].  Pt is interested in setting up Health Coaching appointments.   Explained pt will be seen every 6 week in office and her next appointment will be scheduled for mid-September.  Pt reported just starting to take her fasting blood sugars again and her values are 190-200.  We discussed working towards 120 or less for fasting blood sugars.  Pt also reported taking her diabetes medicine twice a day, morning and night, and doesn't miss days typically.  I will follow up with her in two weeks to schedule sept appointment and glucose values.

## 2011-07-15 NOTE — Progress Notes (Signed)
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

## 2011-07-30 ENCOUNTER — Other Ambulatory Visit: Payer: Self-pay | Admitting: Family Medicine

## 2011-07-30 NOTE — Telephone Encounter (Signed)
Refill request

## 2011-08-17 ENCOUNTER — Ambulatory Visit (INDEPENDENT_AMBULATORY_CARE_PROVIDER_SITE_OTHER): Payer: Medicare Other | Admitting: Family Medicine

## 2011-08-17 ENCOUNTER — Encounter: Payer: Self-pay | Admitting: Home Health Services

## 2011-08-17 VITALS — BP 130/81 | HR 108 | Temp 98.0°F | Ht 64.0 in | Wt 245.4 lb

## 2011-08-17 DIAGNOSIS — E669 Obesity, unspecified: Secondary | ICD-10-CM

## 2011-08-17 DIAGNOSIS — Z23 Encounter for immunization: Secondary | ICD-10-CM

## 2011-08-17 DIAGNOSIS — E119 Type 2 diabetes mellitus without complications: Secondary | ICD-10-CM

## 2011-08-17 NOTE — Patient Instructions (Addendum)
1. Focus on going back to the [facility] 2-3 times a week. 2. Continue to go to your churches exercise group. 3. Eat 4-5 small meals throughout the day. 4. Focus on eating 3-4 vegetables day, limit starches. 5. Take time for you!  6. Call Rosalita Chessman on Monday 10/24 and let me know how the [facility] was. 7. Talk to Dr.  Ralene Cork about reducing your gabapentin.  8. Dr. Deirdre Priest will call in prescription for shingles vaccine.  Once you get it, schedule nurses visit here at [facility] for shot.  9. Schedule follow up visit with Dr. Deirdre Priest for mid-November.

## 2011-08-17 NOTE — Assessment & Plan Note (Signed)
No progress.  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  Pt set goals to start going to [facility] 2-3 a week and to continue with her church weekly exercise group.  Start to eat 3-4 small meals a day, versus meal skipping. And to work on portion control, focusing on eating more vegetables while limiting starches.

## 2011-08-17 NOTE — Assessment & Plan Note (Addendum)
Assessment: Diabetes control: controlled Progress towards goals: maintaining goal Pt brought glucometer, fasting blood sugar values 160-200 Barriers to meeting goals:  Diet/exercise   Plan: Diabetes treatment: continue current medication, monitor dietary portions, limit starches/sugars,  increase physical activity at [facility] to 2-3 times a week.  Refer to:  Continue counseling with health coach for weight loss and diet.  Instruction/counseling given: reviewed low carb diet, portion sizes, importance of daily exercise.     Will check A1c in 2 months

## 2011-08-17 NOTE — Progress Notes (Signed)
  Subjective:    Patient ID: [name], female    DOB: 08-23-1951, 60 y.o.   MRN: [id number]  HPI  DIABETES Disease Monitoring:  Blood Sugar ranges: fasting 160-200 Polyuria/phagia/dipsia:  none Visual problems: no  Medications Compliance: Pt reports taking medicines daily.   OBESITY Current weight/BMI :  245 lbs How long have been obese:  20 + years Course:  worsening Problems or symptoms it causes:  Diabetes, hypertension,    Things have tried to improve:  Portion control and exercise   Patient Identified Concern: To move better, weight loss, not feel tired, eat healthy Stage of Change Patient Is In: Preparation.  Pt has been planning to make changes for less that 6 months.  Patient Reported Barriers: Husbands illness, lack of motivation Patient Reported Perceived Benefits: Feeling better, more energy Patient Reports Self-Efficacy:  Pt displayed moderate self-efficacy.  Pt has been able to maintainexercise routines in the past.  Behavior Change Supports: Church exercise group/friends. [employer] silver sneakers.  Goals:  Re-join [employer] and go 2-3 times a week.  Continue with weekly church exercise group.  Limit starches, eating 3-4 small meals a day versus meal skipping, focusing on eating more vegetables.  Patient Education: We discussed portion control and foods that raise her blood sugar such as sugars/starches.   Review of Symptoms - see HPI  PMH - Smoking status noted.     Review of Systems     Objective:   Physical Exam  No acute distress.   Flu vaccine was administered.     Assessment & Plan:

## 2011-09-01 ENCOUNTER — Encounter: Payer: Self-pay | Admitting: Gastroenterology

## 2011-09-08 ENCOUNTER — Telehealth: Payer: Self-pay | Admitting: Home Health Services

## 2011-09-08 NOTE — Telephone Encounter (Signed)
Pt called to follow up.  Pt reports continuing to work out every Wednesday at church and has just started walking again.  Pt couldn't talk long and still feels overwhelmed with taking care of her husband.  Pt reports feeling stressed.  Patient's overall goal is to lose weight and control chronic diseases.

## 2011-09-17 ENCOUNTER — Ambulatory Visit (AMBULATORY_SURGERY_CENTER): Payer: 59 | Admitting: *Deleted

## 2011-09-17 ENCOUNTER — Telehealth: Payer: Self-pay | Admitting: Gastroenterology

## 2011-09-17 VITALS — Ht 63.0 in | Wt 243.0 lb

## 2011-09-17 DIAGNOSIS — Z1211 Encounter for screening for malignant neoplasm of colon: Secondary | ICD-10-CM

## 2011-09-17 MED ORDER — PEG-KCL-NACL-NASULF-NA ASC-C 100 G PO SOLR: ORAL | Status: DC

## 2011-09-17 NOTE — Telephone Encounter (Signed)
Note faxed to [phone]/ pts. Request.  Copy mailed to pt.

## 2011-09-23 ENCOUNTER — Telehealth: Payer: Self-pay | Admitting: Home Health Services

## 2011-09-23 NOTE — Telephone Encounter (Signed)
Spoke with [employer]  Pt reports taking medication daily and not missing any days.  Pt reports fasting blood sugars today and yesterday at 180 and 194.  Pt reports blood pressure values at 131/80 and 130/74. [name] shared that she is watching her diet, managing stress, and participating in exercise group to help manage bp.  Although pt states that her stress has not changed and she still struggles with taking care of her husband.   Pt reported losing 2 lbs at recent doctors visit and was please with that.  Pt established goal to continue on weight loss by not eating in the evening and focusing on fruits and vegetables.  Limiting fried foods.  Pt will also begin going to [facility] for extra day of exercise.  Pt's overall goal is weight loss, diabetes/bp management, stress management.

## 2011-09-30 ENCOUNTER — Telehealth: Payer: Self-pay | Admitting: Home Health Services

## 2011-09-30 ENCOUNTER — Other Ambulatory Visit: Payer: Self-pay | Admitting: Family Medicine

## 2011-09-30 NOTE — Telephone Encounter (Signed)
Spoke with [employer]  Pt doing well.  Pt reports exercising with church group every Wednesday.  We also talked about portion size and pt was able to teach back to me, small portion sizes throughout the day with a focus on vegetables and limiting starches.  Pt reported losing 2 lbs and that she feels a little bit better.  Pt reports still struggling with stress management but doing better.  Pt's fasting blood sugar values yesterday 179 and today 145.  Takes medications daily without missing a day.   Pt has scheduled colonoscopy for 11/5.  Scheduled fu appointment with PCP/health coach for 10/13/11.  Pt's overall goal is to lose weight/diabetes management/exercise/stress reduction.

## 2011-09-30 NOTE — Telephone Encounter (Signed)
Refill request

## 2011-10-04 ENCOUNTER — Ambulatory Visit (AMBULATORY_SURGERY_CENTER): Payer: Medicare Other | Admitting: Gastroenterology

## 2011-10-04 ENCOUNTER — Encounter: Payer: Self-pay | Admitting: Gastroenterology

## 2011-10-04 VITALS — BP 144/88 | HR 84 | Temp 97.3°F | Resp 17 | Ht 63.0 in | Wt 243.0 lb

## 2011-10-04 DIAGNOSIS — Z1211 Encounter for screening for malignant neoplasm of colon: Secondary | ICD-10-CM

## 2011-10-04 DIAGNOSIS — K648 Other hemorrhoids: Secondary | ICD-10-CM

## 2011-10-04 MED ORDER — SODIUM CHLORIDE 0.9 % IV SOLN: 500.0000 mL | INTRAVENOUS | Status: DC

## 2011-10-04 NOTE — Progress Notes (Signed)
Pt's ride not here at time of discharge. Placed into ante holding area to wait on ride.

## 2011-10-04 NOTE — Patient Instructions (Signed)
[name] and [name] [facility] discharge instructions reviewed with patient and care partner.  Impressions/recommendations:  Diverticulosis (handout given) High Fiber diet handout given  Hemorrhoids (handout given)  You may resume medications as you were taking them prior to your procedure.

## 2011-10-04 NOTE — Progress Notes (Signed)
Pt tolerated the colonoscopy very well. Maw  Pt's o2 sats dropped to 84%.  I encouraged her to take a deep breath.  I flattened her pillow and the sats did come up to 88%.  Then I increased o2 to 4 liters n.c.  Pt's sats stayed above 90% after these measures. maw

## 2011-10-05 ENCOUNTER — Telehealth: Payer: Self-pay | Admitting: *Deleted

## 2011-10-05 NOTE — Telephone Encounter (Signed)

## 2011-10-07 ENCOUNTER — Telehealth: Payer: Self-pay | Admitting: Home Health Services

## 2011-10-07 NOTE — Telephone Encounter (Signed)
Spoke with [employer]  Pt reports taking medications daily and not missing any days.  Pt reports fasting blood sugar for today at 156.  Reports not taking glucose daily but a few times a week.  Pt reports blood pressure reading today at 139/70.  [name] states she feels fine.  We talked about stress and pt sounded fine and reported that her stress has been less lately.  She had a colonoscopy on 11/5 and it took her 2 days to feel better but everything was fine with the screening.  Pt reports exercising 1x week with church, but is not ready to start going to [facility] because she has to figure out bus/travel situation.   Pt has scheduled fu visit 11/14 in office.  Pt's overall goal is to lose weight/increase exercise.

## 2011-10-13 ENCOUNTER — Ambulatory Visit (INDEPENDENT_AMBULATORY_CARE_PROVIDER_SITE_OTHER): Payer: Medicare Other | Admitting: Family Medicine

## 2011-10-13 ENCOUNTER — Encounter: Payer: Self-pay | Admitting: Family Medicine

## 2011-10-13 VITALS — BP 117/75 | HR 102 | Temp 98.1°F | Ht 63.0 in | Wt 242.6 lb

## 2011-10-13 DIAGNOSIS — I10 Essential (primary) hypertension: Secondary | ICD-10-CM

## 2011-10-13 DIAGNOSIS — E669 Obesity, unspecified: Secondary | ICD-10-CM

## 2011-10-13 DIAGNOSIS — E119 Type 2 diabetes mellitus without complications: Secondary | ICD-10-CM

## 2011-10-13 NOTE — Assessment & Plan Note (Signed)
Assessment:  Some improvement, Pt loss 3 lbs.     Plan: Continued group exercises at church, home exercises 2-3 times a week.  Reduce sodium in diet.  Eating small meals throughout day versus meal skipping.  Limiting starches, increasing vegetables. Continued counseling with health educator and weekly phone calls.

## 2011-10-13 NOTE — Progress Notes (Signed)
  Subjective:    Patient ID: [name], female    DOB: Mar 26, 1951, 60 y.o.   MRN: [id number]  HPI  HYPERTENSION Pt reports dizziness when standing up and walking.  Onset 3 weeks ago, occuring daily.  Usually takes 15-20 minutes to recover. Headaches every other day. Disease Monitoring Home BP Monitoring did not bring log Chest pain- no     Dyspnea-  no  Medications Compliance: taking as prescribed. Lightheadedness-  yes, has feelings of dizzyness, not vertigo when standing. No LOC  Edema-  no   ROS - See HPI  PMH Cardiovascular risk factors: diabetes mellitus, hypertension, obesity (BMI >= 30 kg/m2) and sedentary lifestyle Lab Review   Potassium  Date Value Range Status  06/30/2011 4.5  3.5-5.3 (mEq/L) Final     Sodium  Date Value Range Status  06/30/2011 139  135-145 (mEq/L) Final     DIABETES Disease Monitoring: Blood Sugar ranges-140-199 Polyuria/phagia/dipsia- no      Visual problems- no  Medications: Compliance- Pt takes medications as precribed Hypoglycemic symptoms- no  OBESITY  Current weight/BMI :  242.6lbs 42.97 BMI How long have been obese:  10+yrs Course:  Somewhat improving Problems or symptoms it causes:  DM, HTN   Things have tried to improve:  Exercise/diet   Patient Identified Concern:  Weightloss, getting "better" Stage of Change Patient Is In:  Contemplation (pt has been making changes less than 6 months) Patient Reported Barriers:  Habit, lazy, taking care of husband, no time Patient Reported Perceived Benefits:  Feel better, get a job again.  Patient Reports Self-Efficacy:   Pt displays some self-efficacy Behavior Change Supports:  Church group exercise Goals:  To lose weight Patient Education:  We discussed diabetic diet, portion sizes, sodium reduction, increasing exercises.     Review of Systems     Objective:   Physical Exam   Awake alert no acute distress      Assessment & Plan:

## 2011-10-13 NOTE — Patient Instructions (Addendum)
1. Limit soda and tea, drink water every day. 2. Continue weekly exercise at church, start home exercises 2-3 times week.  3. Eating small meals throughout the day 3-4. 4. Limiting starch 1-2 day, watch your portion size.  5. Cut down back on salt. 6. Continue taking medications every day. 7. Take your blood sugar and blood pressure every morning, keep record.  8. Take time for you!   Stop your maxzide  Cut back on gabapentin to 1 tablet in the AM and 1 in the afternoon and 3 at night  Check your bp daily and write down the readings  Rosalita Chessman will call you to check on blood pressure and how you are feeling  Call us if you are feeling more dizzy

## 2011-10-14 NOTE — Assessment & Plan Note (Signed)
Not well controlled in that she his having low blood pressure and probable lightheadness due to this.  Will stop her maxzide and lower gabapentin and follow her home readings.  She is reluctant to return but instructed if not improving she needs to come in to see me.   Recent cbc normal.

## 2011-10-18 ENCOUNTER — Telehealth: Payer: Self-pay | Admitting: Family Medicine

## 2011-10-18 NOTE — Telephone Encounter (Signed)
Needs form faxed over to [employer] stating she was seen on 10/13/2011.  She says she needs this done today.

## 2011-10-19 ENCOUNTER — Telehealth: Payer: Self-pay | Admitting: Home Health Services

## 2011-10-19 NOTE — Telephone Encounter (Signed)
Spoke with [employer]  Pt reports taking medications daily without missing a day.  Pt reports bp for Monday 11/19 at 117/60 and fasting glucose at 201.  Pt said her nephew died and she has been down for a few days and eating more than she should.  Pt's goal this next week is to eat less carbohydrates and to exercise in her home 2 times.  Pt's overall goal is weight loss, htn/dm management.

## 2011-10-19 NOTE — Telephone Encounter (Signed)
LMOVM asking pt to callback with the fax number to transportation.  Not sure where she is talking about.   Asked her to call back and leave the number with the girls up front.............Marland KitchenFleeger, [name]

## 2011-10-19 NOTE — Telephone Encounter (Signed)
Sent transportation the info needed.

## 2011-10-26 ENCOUNTER — Other Ambulatory Visit: Payer: Self-pay | Admitting: Family Medicine

## 2011-10-26 NOTE — Telephone Encounter (Signed)
Refill request

## 2011-10-27 ENCOUNTER — Other Ambulatory Visit: Payer: Self-pay | Admitting: Family Medicine

## 2011-10-27 MED ORDER — NIFEDIPINE ER OSMOTIC RELEASE 90 MG PO TB24: 90.0000 mg | ORAL_TABLET | Freq: Every day | ORAL | Status: DC

## 2011-10-29 ENCOUNTER — Telehealth: Payer: Self-pay | Admitting: Home Health Services

## 2011-10-29 NOTE — Telephone Encounter (Signed)
Spoke with [name]  Pt reports taking medications daily, Patient missed 0 days this week.   Pt has reduced gabapentine to 1-morning 1-afternoon 3-night. Pt forgot to stop maxzide, and I reminded her PCP asked her to stop taking at last office appointment.  Pt said she would stop.   Pt reports still feeling dizzy daily and has headaches.   Pt reports cutting down on food and has missed exercises this past week.  This weeks goals: pt did not want to set goals for this next week.  Pt's overall goal is DM management, weight loss.

## 2011-11-05 ENCOUNTER — Telehealth: Payer: Self-pay | Admitting: Home Health Services

## 2011-11-05 NOTE — Telephone Encounter (Signed)
Spoke with [employer]  Pt reports taking medications daily without missing a day.  Pt has stopped taking maxzide and reports still feeling dizzy daily but headaches have cut back.  Pt was able to start drinking one 20 oz bottle of water a day.   Other than that she drinks kool aid or soda.  We talked about small behavior changes of increasing water and decreasing sugary drinks.  Pt agreed and set goal to try and drink 2 bottles of water a day.  Pt still attend weekly exercise group at church but has not increased exercise other than that.  Pt's overall goal is dm management/weight loss.

## 2011-11-05 NOTE — Telephone Encounter (Signed)
Spoke with [employer]  She is not currently taking bp at home b/c machine needs new batteries.  I encouraged her to get batteries and to start taking bp daily and keeping record.  I will follow up with her readings next week.

## 2011-11-05 NOTE — Telephone Encounter (Signed)
She was supposed to be taking her blood pressure readings.  Could you check to see if she is and what they are Thanks

## 2011-11-12 ENCOUNTER — Telehealth: Payer: Self-pay | Admitting: Home Health Services

## 2011-11-12 NOTE — Telephone Encounter (Signed)
Spoke with [employer]  Pt reports taking medications as prescribed without missing days. Pt's fastin glucose yesterday 159, today 149. Pt's BP are as follows 12/10 120-76 12/11 124/83 12/12 121/80  12/13 127/83  12/14 126/85  Pt reports exercising 1x week with church group. Pt did not reach goal of increasing water, her water bottle broke and she is looking to get another one.  Pt reports drink soda/kool-aid.  Pt set goal to eat at least small 3 meals and increase water for 3 days over the next week.  Pt currently is only eating 2 meals a day skipping breakfast.  Pt's overall goal is weight loss/excercise.

## 2011-11-16 ENCOUNTER — Other Ambulatory Visit: Payer: Self-pay | Admitting: Family Medicine

## 2011-11-16 DIAGNOSIS — Z1231 Encounter for screening mammogram for malignant neoplasm of breast: Secondary | ICD-10-CM

## 2011-12-09 ENCOUNTER — Telehealth: Payer: Self-pay | Admitting: Home Health Services

## 2011-12-09 NOTE — Telephone Encounter (Signed)
Spoke with [employer]  Pt reports taking medications daily without missing a day. Pt reported fasting blood sugar around 150 or less with one day at 197. Pt reported not feeling well and a little stressed about her husband but couldn't talk about it on the phone.  Pt reported starting up group exercise at church this week and we discussed adding walking outside to routine.  Pt was sure if she would do that.   Pt reported drinking soda/kool-aid occasionally but usually drinks just water and milk daily.  Pt reported some arm pain and said she would schedule a fu appointment with PCP in February when she had more time.  Pt's overall goal is stress management, diet/exercise changes, dm management.

## 2011-12-20 ENCOUNTER — Ambulatory Visit
Admission: RE | Admit: 2011-12-20 | Discharge: 2011-12-20 | Disposition: A | Discharge status: Home / Self Care | Payer: Medicare Other | Source: Ambulatory Visit | Attending: Family Medicine | Admitting: Family Medicine

## 2011-12-20 DIAGNOSIS — Z1231 Encounter for screening mammogram for malignant neoplasm of breast: Secondary | ICD-10-CM

## 2011-12-22 ENCOUNTER — Ambulatory Visit: Payer: Medicare Other

## 2011-12-23 ENCOUNTER — Telehealth: Payer: Self-pay | Admitting: Home Health Services

## 2011-12-23 ENCOUNTER — Other Ambulatory Visit: Payer: Self-pay | Admitting: Family Medicine

## 2011-12-23 DIAGNOSIS — R928 Other abnormal and inconclusive findings on diagnostic imaging of breast: Secondary | ICD-10-CM

## 2011-12-23 NOTE — Telephone Encounter (Signed)
Spoke with [employer]  Pt reports taking medications daily.  Fast blood glucose yesterday was 141, today 181.   Pt has a cold right now and feels her stress is high.   Pt reports eating when she feels stressed and we talked about finding other stress releases besides food.  Pt couldn't come up with any answers.  We also talked about food choices like fruits/vegetables when she feels stressed out and wants to eat.    Pt reports exercising weekly with church group and doing some stretches/crunches a few times at home this past week.  Pt reports drinking more water and limiting sugary drinks.   Pt's overall goal is diet/exercise, dm management, stress reduction.

## 2011-12-30 ENCOUNTER — Ambulatory Visit
Admission: RE | Admit: 2011-12-30 | Discharge: 2011-12-30 | Disposition: A | Discharge status: Home / Self Care | Payer: Medicare Other | Source: Ambulatory Visit | Attending: Family Medicine | Admitting: Family Medicine

## 2011-12-30 ENCOUNTER — Telehealth: Payer: Self-pay | Admitting: Home Health Services

## 2011-12-30 DIAGNOSIS — R928 Other abnormal and inconclusive findings on diagnostic imaging of breast: Secondary | ICD-10-CM

## 2011-12-30 NOTE — Telephone Encounter (Signed)
Spoke with [employer]  Pt reports taking medications daily without missing any days. Pt reports having a cold but is getting better.   Pt thinks taking the cough syrup is a cause for higher blood sugar numbers.  Fasting blood glucose are as follows: Mon 180 Tue 178 Wed 154 Thurs 175  Pt reports exercising in her a home a few times a week and pushes her husband around a lot in his wheel chair. Pt reports not eating sweets to often and now only drinks water every day.  Pt's goal this week is to continue with the changes she has already made.  Pt's overall goal is wt loss, dm management, stress management.

## 2012-01-06 ENCOUNTER — Telehealth: Payer: Self-pay | Admitting: Home Health Services

## 2012-01-06 NOTE — Telephone Encounter (Signed)
Spoke with [employer]  Pt reports taking medications daily as prescribed without missing any days. Fasting blood sugars are as follows: 2/6 165 2/5 164 2/4 114 2/3 164 2/2 122  Pt reports having increased vegetables daily.  Drinks 16 oz of water day along with tea.  Pt reports not going to exercise group this week but does at home stretches/crunches several times a week.  Pt reports feeling well and will continue to working these behavior changes this next week.  Pt's overall goal is wt loss, dm management, stress management.

## 2012-01-25 ENCOUNTER — Other Ambulatory Visit: Payer: Self-pay | Admitting: Family Medicine

## 2012-01-25 NOTE — Telephone Encounter (Signed)
Refill request

## 2012-01-26 ENCOUNTER — Ambulatory Visit (INDEPENDENT_AMBULATORY_CARE_PROVIDER_SITE_OTHER): Payer: Medicare Other | Admitting: Family Medicine

## 2012-01-26 ENCOUNTER — Telehealth: Payer: Self-pay | Admitting: Home Health Services

## 2012-01-26 ENCOUNTER — Encounter: Payer: Self-pay | Admitting: Family Medicine

## 2012-01-26 VITALS — BP 124/80 | HR 103 | Temp 97.7°F | Ht 63.0 in | Wt 235.0 lb

## 2012-01-26 DIAGNOSIS — R5383 Other fatigue: Secondary | ICD-10-CM | POA: Insufficient documentation

## 2012-01-26 DIAGNOSIS — E119 Type 2 diabetes mellitus without complications: Secondary | ICD-10-CM

## 2012-01-26 DIAGNOSIS — I10 Essential (primary) hypertension: Secondary | ICD-10-CM

## 2012-01-26 DIAGNOSIS — R5381 Other malaise: Secondary | ICD-10-CM

## 2012-01-26 LAB — COMPREHENSIVE METABOLIC PANEL
Albumin: 4 g/dL (ref 3.5–5.2)
Alkaline Phosphatase: 85 U/L (ref 39–117)
BUN: 16 mg/dL (ref 6–23)
CO2: 24 mEq/L (ref 19–32)
Glucose, Bld: 173 mg/dL — ABNORMAL HIGH (ref 70–99)
Potassium: 4.1 mEq/L (ref 3.5–5.3)

## 2012-01-26 LAB — TSH: TSH: 1.844 u[IU]/mL (ref 0.350–4.500)

## 2012-01-26 MED ORDER — BENAZEPRIL HCL 10 MG PO TABS: 10.0000 mg | ORAL_TABLET | Freq: Every day | ORAL | Status: DC

## 2012-01-26 NOTE — Assessment & Plan Note (Signed)
Hopefully is due to low blood pressure and her weight loss is due to dieting.  Will check labs and follow up

## 2012-01-26 NOTE — Patient Instructions (Signed)
Stop the Procardia and then measure your blood pressure very day and then call us with the readings  If we tell you to then start  the new medication Lisinopril (Benazapril) 10 mg once a day  Do not take the maxzide  Do not take ibuprofen when you are taking diclofenac.  Use tylenol instead   .I will call you if your lab tests are not normal.  Otherwise we will discuss them at your next visit.

## 2012-01-26 NOTE — Telephone Encounter (Signed)
Spoke with [employer]  Pt had a 7 lb weight loss.  Pt was able to maintain A1C from 3 months ago.  Pt reports being mindful about portion sizes, eating 3 vegetables a day, and increasing daily water.  Pt reports exercising with church group 1x a week.  Pt even said her son was able to lose 7 lbs because of changes they have made in the house.   Pt reports feeling low of energy.  Pt expresses an interest in becoming more social and outgoing but feels her energy level prevents her from doing that.  She reports recently having starched to watch a 32yr and 5yr children 3x a week.    Pt set goal to visit 1 family member this week to get out of the house  Pt expressed interest in continuing weekly health coaching phone calls and will follow up with me about social activity goal next week.   Pt's overall goal is wt loss, dm management, and stress management.

## 2012-01-26 NOTE — Progress Notes (Signed)
  Subjective:    Patient ID: [name], female    DOB: Dec 24, 1950, 61 y.o.   MRN: [id number]  HPI  Fatigue and lightheadness For weeks has felt overall tired and having lightheadness when she stands that improves after she walks.  At last visit her diuretic was stopped which did cause more foot swellin but did not help her lightheadness that much.   Take procardia regularly Has been trying to lose weight and has lost 7 lbs No fever or chills or focal weakenss of arm or leg or persistent visual changes or any bloody or melanotic stools  DIABETES Disease Monitoring: Blood Sugar ranges-her blood sugar over the last week have been > 200  Polyuria/phagia/dipsia- no      Visual problems- no  Medications: Compliance- daily metformin Hypoglycemic symptoms- no  Review of Symptoms - see HPI  PMH - Smoking status noted.      Review of Systems     Objective:   Physical Exam  Alert no acute distress Heart - Regular rate and rhythm.  No murmurs, gallops or rubs.    Lungs:  Normal respiratory effort, chest expands symmetrically. Lungs are clear to auscultation, no crackles or wheezes. Extremities:  No cyanosis,obese with 1 + pitting edema no lesion  Assessment & Plan:

## 2012-01-26 NOTE — Assessment & Plan Note (Signed)
Continues to be low hopefully due to weight loss and diet.   Will cut her procardia and monitor her blood pressure.   If high would add acei

## 2012-01-26 NOTE — Assessment & Plan Note (Signed)
Good control now but am concerned recent fastings have been increasing.  Will monitor

## 2012-01-27 LAB — CBC
HCT: 37.5 % (ref 36.0–46.0)
Hemoglobin: 11.6 g/dL — ABNORMAL LOW (ref 12.0–15.0)
MCH: 26.9 pg (ref 26.0–34.0)
MCHC: 30.9 g/dL (ref 30.0–36.0)

## 2012-01-31 ENCOUNTER — Encounter: Payer: Self-pay | Admitting: Family Medicine

## 2012-01-31 ENCOUNTER — Ambulatory Visit (INDEPENDENT_AMBULATORY_CARE_PROVIDER_SITE_OTHER): Payer: Medicare Other | Admitting: Family Medicine

## 2012-01-31 VITALS — BP 145/93 | HR 103 | Ht 62.0 in | Wt 240.0 lb

## 2012-01-31 DIAGNOSIS — R5383 Other fatigue: Secondary | ICD-10-CM

## 2012-01-31 DIAGNOSIS — I10 Essential (primary) hypertension: Secondary | ICD-10-CM

## 2012-01-31 DIAGNOSIS — R5381 Other malaise: Secondary | ICD-10-CM

## 2012-01-31 NOTE — Progress Notes (Signed)
  Subjective:    Patient ID: [name], female    DOB: 04-Apr-1951, 61 y.o.   MRN: [id number]  Hypertension    Fatigue and lightheadness Still feels very tired without energy.  Her lightheadness is better - she stopped Procardia and just started benazpril 2 days ago.   Continues with no fever or chills or focal weakenss of arm or leg or persistent visual changes or any bloody or melanotic stools She does feel down but has felt this way for years.  No suicidal ideation.   Review of Symptoms - see HPI  PMH - Smoking status noted.  HM noted    Review of Systems      Objective:   Physical Exam   Alert no acute distress - appears down no crying or confusion Heart - Regular rate and rhythm.  No murmurs, gallops or rubs.    Lungs:  Normal respiratory effort, chest expands symmetrically. Lungs are clear to auscultation, no crackles or wheezes. Extremities:  No cyanosis,obese with 1 + pitting edema    Assessment & Plan:

## 2012-01-31 NOTE — Assessment & Plan Note (Addendum)
No Improvement.  Recent thorough lab work up is normal.  She appears depressed but she relates is not any worse than in past and is currently not interested in changing her medications.  Hopefully she may feel better once stable on lisinopril.  Asked her to come in for blood tests and if not feeling better.  Our health coach will help monitor her symptoms  Her weight is up today and she is current on screenig so am less concerned about cancer but need to monitor.  She might benefit from change in antidepressant but she does not like to come in for visits

## 2012-01-31 NOTE — Patient Instructions (Addendum)
Come in for a blood test Friday or Monday - call before  I will call you if your tests are not good.  Otherwise I will send you a letter.  If you do not hear from me with in 2 weeks please call our office.     Come back and see me if you are not better in 2-3 or if you are feeling worse

## 2012-02-10 ENCOUNTER — Telehealth: Payer: Self-pay | Admitting: Home Health Services

## 2012-02-10 NOTE — Telephone Encounter (Signed)
Spoke with [employer]  Pt reports taking medications daily without missing any days.  Pt reports feeling better and reports only feeling a little bit dizzy in the morning.   Pt was unable to exercise this past week.   Pt expressed an interest in continuing with health coaching and set the following goals for this next week. Continue to cut back on portions, not eat past 6:30 pm, focus on just drinking water, and to exercise with her group.  Pt's overall goal is wt loss, htn management, dm management.

## 2012-02-22 ENCOUNTER — Other Ambulatory Visit: Payer: Self-pay | Admitting: Family Medicine

## 2012-02-25 ENCOUNTER — Other Ambulatory Visit: Payer: Self-pay | Admitting: Family Medicine

## 2012-02-28 MED ORDER — METFORMIN HCL 500 MG PO TABS: 500.0000 mg | ORAL_TABLET | Freq: Two times a day (BID) | ORAL | Status: DC

## 2012-03-24 ENCOUNTER — Telehealth: Payer: Self-pay | Admitting: Home Health Services

## 2012-03-24 NOTE — Telephone Encounter (Signed)
Spoke with [employer]  Pt reports taking medications as prescribed without missing any days.  Pt reports fasting blood sugar today is 115.  Yesterday 118.  Pt not self- monitoring bp.  Continues to reports some dizziness or feeling off balance. Discussed if this becomes a concern with her to schedule an office visit with PCP.   Pt reports feeling good and has been making small changes in her diet and exercise.  For example pt now tries to work out an additional 2 days a week at the house. (will need to clarify what type of exercise) along with her exercises with her church group.  Pt reports eating smaller portions and drinks a glass of water before she eats so that she feels full.  Pt reports eating earlier in the day and avoids late night eating.   Pt reports drinking 2 16 oz. Bottles of water a day.  Along with water she also drinks kool-aid and sweet tea.  Pt scheduled iAWV for 5/6.    Pt's goal for this week is to continue to eat smaller portions, earlier in the day, drink 2 16 oz bottles of water a day, cut back on sugar.   Pt will also continue exercising 2-3 times a week.  Pt's overall goal is DM management, weight loss, and stress management.

## 2012-03-28 ENCOUNTER — Other Ambulatory Visit: Payer: Self-pay | Admitting: Family Medicine

## 2012-04-03 ENCOUNTER — Encounter: Payer: Self-pay | Admitting: Home Health Services

## 2012-04-03 ENCOUNTER — Ambulatory Visit (INDEPENDENT_AMBULATORY_CARE_PROVIDER_SITE_OTHER): Payer: Medicare Other | Admitting: Home Health Services

## 2012-04-03 VITALS — BP 120/82 | HR 87 | Temp 97.1°F | Ht 62.0 in | Wt 234.8 lb

## 2012-04-03 DIAGNOSIS — Z0000 Encounter for general adult medical examination without abnormal findings: Secondary | ICD-10-CM

## 2012-04-03 NOTE — Patient Instructions (Signed)
1. Continue to work on weight loss by doing the following:  Eating smaller portions.  Not eating late at night  Exercising 3-4 times a week.  Eating more fruits and vegetables  Focusing on drinking more water, not kool-aid, teas or sodas 2. Try getting 6-8 hours of sleep a night.  That includes going to bed earlier. 3. Talk to pharmacist about shingles vaccine.

## 2012-04-03 NOTE — Progress Notes (Signed)
Patient here for annual wellness visit, patient reports: Risk Factors/Conditions needing evaluation or treatment: Pt does not have any risk factors that need evaluation. Home Safety: Pt lives with husband and son in 1 story home.  Pt reports having smoke detectors and does not have adaptive equipment in bathroom.  Other Information: Corrective lens: Pt wears corrective lens for reading.  Visits eye dr. Omer Jack.  Dentures: Pt does not have dentures.  Visit dentist every 6 months. Memory: Pt denies memory problems. Patient's Mini Mental Score (recorded in doc. flowsheet): 30  Balance/Gait:  Balance Abnormal Patient value  Sitting balance    Sit to stand x Uses arms  Attempts to arise    Immediate standing balance    Standing balance    Nudge    Eyes closed- Romberg    Tandem stance x dizzy  Back lean    Neck Rotation x dizzy  360 degree turn    Sitting down x Uses arms   Gait Abnormal Patient value  Initiation of gait    Step length-left    Step length-right    Step height-left x Drag heels  Step height-right x Drag heels  Step symmetry    Step continuity    Path deviation    Trunk movement x Stiff at waist  Walking stance x Wide base      Annual Wellness Visit Requirements Recorded Today In  Medical, family, social history Past Medical, Family, Social History Section  Current providers Care team  Current medications Medications  Wt, BP, Ht, BMI Vital signs  Visual acuity (welcome visit) Vision/hearing  Hearing assessment (welcome visit) Vision/hearing  Tobacco, alcohol, illicit drug use History  ADL Nurse Assessment  Depression Screening Nurse Assessment  Cognitive impairment Nurse Assessment  Mini Mental Status Document Flowsheet  Fall Risk Nurse Assessment  Home Safety Progress Note  End of Life Planning (welcome visit) Social Documentation  Medicare preventative services Progress Note  Risk factors/conditions needing evaluation/treatment Progress Note    Personalized health advice Patient Instructions, goals, letter  Diet & Exercise Social Documentation  Emergency Contact Social Documentation  Seat Belts Social Documentation  Sun exposure/protection Social Documentation    Prevention Plan:   Recommended Medicare Prevention Screenings Women over 65 Test For Frequency Date of Last- BOLD if needed  Breast Cancer 1-2 yrs 1/13  Cervical Cancer 1-3 yrs Not indicated  Colorectal Cancer 1-10 yrs 11/12  Osteoporosis once discussed  Cholesterol 5 yrs 8/12  Diabetes yearly 2/13  HIV yearly declined  Influenza Shot yearly 9/12  Pneumonia Shot once discussed at 63 yrs  Zostavax Shot once discussed   : I have reviewed this visit and discussed with Arlys John and agree with her documentation. CHAMBLISS,MARSHALL L

## 2012-04-04 ENCOUNTER — Encounter: Payer: Self-pay | Admitting: Home Health Services

## 2012-04-10 ENCOUNTER — Encounter (HOSPITAL_COMMUNITY): Payer: Self-pay | Admitting: Emergency Medicine

## 2012-04-10 ENCOUNTER — Emergency Department (HOSPITAL_COMMUNITY)
Admission: EM | Admit: 2012-04-10 | Discharge: 2012-04-11 | Disposition: A | Discharge status: Home / Self Care | Payer: Medicare Other | Attending: Emergency Medicine | Admitting: Emergency Medicine

## 2012-04-10 DIAGNOSIS — K219 Gastro-esophageal reflux disease without esophagitis: Secondary | ICD-10-CM | POA: Insufficient documentation

## 2012-04-10 DIAGNOSIS — R1032 Left lower quadrant pain: Secondary | ICD-10-CM | POA: Insufficient documentation

## 2012-04-10 DIAGNOSIS — I10 Essential (primary) hypertension: Secondary | ICD-10-CM | POA: Insufficient documentation

## 2012-04-10 DIAGNOSIS — K7689 Other specified diseases of liver: Secondary | ICD-10-CM | POA: Insufficient documentation

## 2012-04-10 DIAGNOSIS — R197 Diarrhea, unspecified: Secondary | ICD-10-CM | POA: Insufficient documentation

## 2012-04-10 DIAGNOSIS — Z79899 Other long term (current) drug therapy: Secondary | ICD-10-CM | POA: Insufficient documentation

## 2012-04-10 DIAGNOSIS — J45909 Unspecified asthma, uncomplicated: Secondary | ICD-10-CM | POA: Insufficient documentation

## 2012-04-10 DIAGNOSIS — R110 Nausea: Secondary | ICD-10-CM | POA: Insufficient documentation

## 2012-04-10 DIAGNOSIS — E119 Type 2 diabetes mellitus without complications: Secondary | ICD-10-CM | POA: Insufficient documentation

## 2012-04-10 LAB — CBC
MCV: 85 fL (ref 78.0–100.0)
Platelets: 297 10*3/uL (ref 150–400)
RBC: 4.12 MIL/uL (ref 3.87–5.11)
RDW: 15.1 % (ref 11.5–15.5)
WBC: 10.5 10*3/uL (ref 4.0–10.5)

## 2012-04-10 LAB — GLUCOSE, CAPILLARY: Glucose-Capillary: 154 mg/dL — ABNORMAL HIGH (ref 70–99)

## 2012-04-10 LAB — DIFFERENTIAL
Basophils Absolute: 0.1 10*3/uL (ref 0.0–0.1)
Lymphocytes Relative: 40 % (ref 12–46)
Lymphs Abs: 4.2 10*3/uL — ABNORMAL HIGH (ref 0.7–4.0)
Neutro Abs: 5.3 10*3/uL (ref 1.7–7.7)
Neutrophils Relative %: 51 % (ref 43–77)

## 2012-04-10 LAB — BASIC METABOLIC PANEL
CO2: 27 mEq/L (ref 19–32)
Chloride: 102 mEq/L (ref 96–112)
Glucose, Bld: 127 mg/dL — ABNORMAL HIGH (ref 70–99)
Potassium: 3.9 mEq/L (ref 3.5–5.1)
Sodium: 139 mEq/L (ref 135–145)

## 2012-04-10 MED ORDER — HYDROMORPHONE HCL PF 1 MG/ML IJ SOLN
1.0000 mg | Freq: Once | INTRAMUSCULAR | Status: AC
  Administered 2012-04-11: 1 mg via INTRAVENOUS
  Filled 2012-04-10: qty 1

## 2012-04-10 MED ORDER — SODIUM CHLORIDE 0.9 % IV BOLUS (SEPSIS)
1000.0000 mL | Freq: Once | INTRAVENOUS | Status: AC
  Administered 2012-04-11: 1000 mL via INTRAVENOUS

## 2012-04-10 MED ORDER — ONDANSETRON HCL 4 MG/2ML IJ SOLN
4.0000 mg | Freq: Once | INTRAMUSCULAR | Status: AC
  Administered 2012-04-11: 4 mg via INTRAVENOUS
  Filled 2012-04-10: qty 2

## 2012-04-10 NOTE — ED Notes (Signed)
PT. REPORTS LEFT LATERAL ABDOMINAL PAIN WITH NAUSEA AND DIARRHEA  , DENIES INJURY , NO FEVER OR CHILLS.

## 2012-04-11 ENCOUNTER — Emergency Department (HOSPITAL_COMMUNITY): Payer: Medicare Other

## 2012-04-11 LAB — URINALYSIS, ROUTINE W REFLEX MICROSCOPIC
Hgb urine dipstick: NEGATIVE
Nitrite: NEGATIVE
Specific Gravity, Urine: 1.024 (ref 1.005–1.030)
Urobilinogen, UA: 0.2 mg/dL (ref 0.0–1.0)

## 2012-04-11 LAB — URINE MICROSCOPIC-ADD ON

## 2012-04-11 MED ORDER — ONDANSETRON 8 MG PO TBDP: 8.0000 mg | ORAL_TABLET | Freq: Three times a day (TID) | ORAL | Status: AC | PRN

## 2012-04-11 MED ORDER — IOHEXOL 300 MG/ML  SOLN
100.0000 mL | Freq: Once | INTRAMUSCULAR | Status: AC | PRN
  Administered 2012-04-11: 100 mL via INTRAVENOUS

## 2012-04-11 MED ORDER — HYDROCODONE-ACETAMINOPHEN 5-500 MG PO TABS: 1.0000 | ORAL_TABLET | Freq: Four times a day (QID) | ORAL | Status: AC | PRN

## 2012-04-11 NOTE — ED Notes (Signed)
Pt given CT contrast with plan of care updated, pt verbalized understanding to notify staff when contrast is complete. Will continue to monitor pt.

## 2012-04-11 NOTE — ED Provider Notes (Signed)
History     CSN: [id number]  Arrival date & time 04/10/12  2022   First MD Initiated Contact with Patient 04/10/12 2307      Chief Complaint  Patient presents with  . Abdominal Pain    HPI The patient reports approximately 24 hours of left sided abdominal pain with nausea and diarrhea.  She's had no vomiting.  She denies fevers or chills.  She has no left flank pain.  She denies urinary symptoms.  She's had no new vaginal complaints.  Her diarrhea has been watery.  She denies melena and hematochezia.  She's never had symptoms like this before.  She has no history of diverticulitis.  She does have a history of hypertension,  Diabetes.  Her symptoms are worsened by movement and palpation.  Nothing improves her symptoms.  Her symptoms are constant.  Her pain is moderate in severity this time   Past Medical History  Diagnosis Date  . Hypertension   . Diabetes mellitus   . Depression   . Asthma   . Eczema   . Arthritis   . Cataract   . GERD (gastroesophageal reflux disease)   . History of kidney stones     Past Surgical History  Procedure Date  . Cystoscopy w/ litholapaxy / ehl   . Cholecystectomy 2003  . Replacement total knee bilateral 2005 &2006    Family History  Problem Relation Age of Onset  . Diabetes Mother   . Stroke Mother   . Heart disease Father   . Anemia Father     History  Substance Use Topics  . Smoking status: Never Smoker   . Smokeless tobacco: Never Used  . Alcohol Use: No    OB History    Grav Para Term Preterm Abortions TAB SAB Ect Mult Living                  Review of Systems  All other systems reviewed and are negative.    Allergies  Review of patient's allergies indicates no known allergies.  Home Medications   Current Outpatient Rx  Name Route Sig Dispense Refill  . ALBUTEROL 90 MCG/ACT IN AERS Inhalation Inhale 1-2 puffs into the lungs 4 (four) times daily as needed. For shortness of breath    . BENAZEPRIL HCL 10 MG PO TABS  Oral Take 1 tablet (10 mg total) by mouth daily. 30 tablet 3  . DESIPRAMINE HCL 100 MG PO TABS  take 1 tablet by mouth at bedtime 30 tablet 10  . DICLOFENAC SODIUM 75 MG PO TBEC Oral Take 75 mg by mouth 2 (two) times daily.    . OMEGA-3 FATTY ACIDS 1000 MG PO CAPS Oral Take 1 g by mouth 2 (two) times daily.    Marland Kitchen FLOVENT HFA 110 MCG/ACT IN AERO  inhale 2 puffs by mouth twice a day 12 g 6  . GABAPENTIN 300 MG PO CAPS Oral Take 300 mg by mouth 3 (three) times daily. Dr Ralene Cork     . METFORMIN HCL 500 MG PO TABS Oral Take 1 tablet (500 mg total) by mouth 2 (two) times daily with a meal. 60 tablet 10  . OXICONAZOLE NITRATE 1 % EX CREA Topical Apply 1 % topically 2 (two) times daily. For itching    . POTASSIUM CITRATE ER 10 MEQ (1080 MG) PO TBCR Oral Take 10 mEq by mouth daily.     Marland Kitchen RANITIDINE HCL 150 MG PO TABS Oral Take 1 tablet (150 mg total) by  mouth at bedtime. 30 tablet 11  . HYDROCODONE-ACETAMINOPHEN 5-500 MG PO TABS Oral Take 1-2 tablets by mouth every 6 (six) hours as needed for pain. 15 tablet 0  . ONDANSETRON 8 MG PO TBDP Oral Take 1 tablet (8 mg total) by mouth every 8 (eight) hours as needed for nausea. 10 tablet 0    BP 151/88  Pulse 91  Temp(Src) 98.1 F (36.7 C) (Oral)  Resp 18  Wt 234 lb (106.142 kg)  SpO2 98%  Physical Exam  Nursing note and vitals reviewed. Constitutional: She is oriented to person, place, and time. She appears well-developed and well-nourished. No distress.  HENT:  Head: Normocephalic and atraumatic.  Eyes: EOM are normal.  Neck: Normal range of motion.  Cardiovascular: Normal rate, regular rhythm and normal heart sounds.   Pulmonary/Chest: Effort normal and breath sounds normal.  Abdominal: Soft. She exhibits no distension.       Left lower quadrant tenderness without guarding or rebound.  No rash noted.  Musculoskeletal: Normal range of motion.  Neurological: She is alert and oriented to person, place, and time.  Skin: Skin is warm and dry.    Psychiatric: She has a normal mood and affect. Judgment normal.    ED Course  Procedures (including critical care time)  Labs Reviewed  URINALYSIS, ROUTINE W REFLEX MICROSCOPIC - Abnormal; Notable for the following:    Protein, ur 30 (*)    Leukocytes, UA TRACE (*)    All other components within normal limits  CBC - Abnormal; Notable for the following:    Hemoglobin 11.1 (*)    HCT 35.0 (*)    All other components within normal limits  DIFFERENTIAL - Abnormal; Notable for the following:    Lymphs Abs 4.2 (*)    All other components within normal limits  BASIC METABOLIC PANEL - Abnormal; Notable for the following:    Glucose, Bld 127 (*)    GFR calc non Af Amer 77 (*)    GFR calc Af Amer 89 (*)    All other components within normal limits  GLUCOSE, CAPILLARY - Abnormal; Notable for the following:    Glucose-Capillary 154 (*)    All other components within normal limits  URINE MICROSCOPIC-ADD ON - Abnormal; Notable for the following:    Squamous Epithelial / LPF FEW (*)    Bacteria, UA FEW (*)    All other components within normal limits   Ct Abdomen Pelvis W Contrast  04/11/2012  *RADIOLOGY REPORT*  Clinical Data: Left-sided pain and left lower quadrant pain.  CT ABDOMEN AND PELVIS WITH CONTRAST  Technique:  Multidetector CT imaging of the abdomen and pelvis was performed following the standard protocol during bolus administration of intravenous contrast.  Contrast: OMNIPAQUE IOHEXOL 300 MG/ML  SOLN the contrast injection was complicated by infiltration of about 50 is 60 ml. Right inferior antecubital region.  I was called to assess the patient in the CT suite.  Upon examination, the IV catheter had been removed.  There was a focal soft tissue swelling in the inferior antecubital region measuring about 4-5 cm diameter.  The area of extravasation was soft.  No erythema.  No blistering. Distal radial and ulnar pulses were intact.  Normal grip strength. Normal capillary reflux.   The patient reported no pain, tenderness, or burning.  The patient reported no complaints.  Contrast extravasation was explained to the patient and recommendation for application of ice packs and arm elevation three times per day over the next  2-3 days for full swelling subsides. The patient was instructed to return to the emergency department if she experiences any change in symptoms, including increasing swelling, erythema, blistering, hot / cold sensation, numbness, or tingling. All of the patient's questions were answered. The patient was discharged back to emergency department good condition. The routine order set was provided.  Comparison: 12/30/2006  Findings: Linear atelectasis or fibrosis in the lung bases.  Mild diffuse hepatic enlargement suggesting fatty infiltration. Spleen size is normal.  Surgical absence of the gallbladder.  The pancreas demonstrates mild fatty infiltration.  Normal adrenal glands.  Tiny punctate stones in the upper pole of the right kidney without obstruction.  No solid mass or hydronephrosis in either kidney.  Normal caliber abdominal aorta without aneurysm.  Stomach, small bowel, and colon are not distended.  No free air or free fluid in the abdomen.  Pelvis:  Mild nodular enlargement of the uterus suggests fibroids. No abnormal adnexal masses.  No free fluid in the pelvis.  No loculated fluid collections.  No inflammatory changes involving the sigmoid colon.  Diverticulosis.  The appendix is not visualized. Degenerative changes in the lumbar spine.  Mild anterior subluxation of L5 on S1.  No vertebral compression.  IMPRESSION: Mild fatty infiltration of the liver.  Punctate nonobstructing stone in the upper pole of the right kidney.  Mild fatty infiltration of the pancreas.  Small uterine fibroids.  No acute process demonstrated in the abdomen or pelvis.  Contrast injection was complicated by extravasation.  See note above.  Original Report Authenticated By: Marlon Pel,  M.D.     1. Abdominal pain       MDM  Her labs urine and CT scan are reassuring.  The patient feels better at this time.  Discharge home with primary care followup.  The patient understands to return to the ER for new or worsening symptoms        Lyanne Co, MD 04/11/12 [id number]

## 2012-04-11 NOTE — Discharge Instructions (Signed)

## 2012-04-11 NOTE — ED Notes (Signed)
Pt denies any questions upon discharge, transportation called for pt.

## 2012-04-14 ENCOUNTER — Telehealth: Payer: Self-pay | Admitting: Home Health Services

## 2012-04-14 NOTE — Telephone Encounter (Signed)
Spoke with [employer].  [name] reports taking medications daily without missing any days. Pt reports home blood glucose monitoring range 115-140.  Pt reported at home blood pressure reading at 122/80.  Pt reports having a lot of pain still and had an appointment with doctor.  Because of pain she hasn't been able to work out this week, but has tried to maintain her dietary changes.  Pt set goal to continue with dietary changes, resolve pain issue, and to start moving more again.  Pt's overall goal is weight loss, dm management.

## 2012-04-28 ENCOUNTER — Emergency Department (HOSPITAL_COMMUNITY): Payer: PRIVATE HEALTH INSURANCE

## 2012-04-28 ENCOUNTER — Inpatient Hospital Stay (HOSPITAL_COMMUNITY)
Admission: EM | Admit: 2012-04-28 | Discharge: 2012-05-03 | DRG: 287 | Disposition: A | Discharge status: Home / Self Care | Payer: PRIVATE HEALTH INSURANCE | Attending: Family Medicine | Admitting: Family Medicine

## 2012-04-28 ENCOUNTER — Encounter (HOSPITAL_COMMUNITY): Payer: Self-pay | Admitting: Emergency Medicine

## 2012-04-28 DIAGNOSIS — D649 Anemia, unspecified: Secondary | ICD-10-CM | POA: Diagnosis present

## 2012-04-28 DIAGNOSIS — F329 Major depressive disorder, single episode, unspecified: Secondary | ICD-10-CM | POA: Diagnosis present

## 2012-04-28 DIAGNOSIS — J4520 Mild intermittent asthma, uncomplicated: Secondary | ICD-10-CM | POA: Diagnosis present

## 2012-04-28 DIAGNOSIS — E785 Hyperlipidemia, unspecified: Secondary | ICD-10-CM | POA: Diagnosis present

## 2012-04-28 DIAGNOSIS — J45909 Unspecified asthma, uncomplicated: Secondary | ICD-10-CM | POA: Diagnosis present

## 2012-04-28 DIAGNOSIS — Z8249 Family history of ischemic heart disease and other diseases of the circulatory system: Secondary | ICD-10-CM

## 2012-04-28 DIAGNOSIS — I059 Rheumatic mitral valve disease, unspecified: Secondary | ICD-10-CM | POA: Diagnosis present

## 2012-04-28 DIAGNOSIS — I2789 Other specified pulmonary heart diseases: Secondary | ICD-10-CM | POA: Diagnosis present

## 2012-04-28 DIAGNOSIS — I428 Other cardiomyopathies: Secondary | ICD-10-CM | POA: Diagnosis present

## 2012-04-28 DIAGNOSIS — Z96659 Presence of unspecified artificial knee joint: Secondary | ICD-10-CM

## 2012-04-28 DIAGNOSIS — F3289 Other specified depressive episodes: Secondary | ICD-10-CM | POA: Diagnosis present

## 2012-04-28 DIAGNOSIS — E1340 Other specified diabetes mellitus with diabetic neuropathy, unspecified: Secondary | ICD-10-CM | POA: Diagnosis present

## 2012-04-28 DIAGNOSIS — I10 Essential (primary) hypertension: Secondary | ICD-10-CM | POA: Diagnosis present

## 2012-04-28 DIAGNOSIS — I079 Rheumatic tricuspid valve disease, unspecified: Secondary | ICD-10-CM | POA: Diagnosis present

## 2012-04-28 DIAGNOSIS — E119 Type 2 diabetes mellitus without complications: Secondary | ICD-10-CM | POA: Diagnosis present

## 2012-04-28 DIAGNOSIS — Z23 Encounter for immunization: Secondary | ICD-10-CM

## 2012-04-28 DIAGNOSIS — Z823 Family history of stroke: Secondary | ICD-10-CM

## 2012-04-28 DIAGNOSIS — K219 Gastro-esophageal reflux disease without esophagitis: Secondary | ICD-10-CM | POA: Diagnosis present

## 2012-04-28 DIAGNOSIS — I509 Heart failure, unspecified: Secondary | ICD-10-CM

## 2012-04-28 DIAGNOSIS — E669 Obesity, unspecified: Secondary | ICD-10-CM | POA: Diagnosis present

## 2012-04-28 DIAGNOSIS — Z833 Family history of diabetes mellitus: Secondary | ICD-10-CM

## 2012-04-28 DIAGNOSIS — L259 Unspecified contact dermatitis, unspecified cause: Secondary | ICD-10-CM | POA: Diagnosis present

## 2012-04-28 DIAGNOSIS — Z79899 Other long term (current) drug therapy: Secondary | ICD-10-CM

## 2012-04-28 HISTORY — DX: Fatty (change of) liver, not elsewhere classified: K76.0

## 2012-04-28 HISTORY — DX: Obesity, unspecified: E66.9

## 2012-04-28 HISTORY — DX: Inconclusive laboratory evidence of human immunodeficiency virus (HIV): R75

## 2012-04-28 HISTORY — DX: Anemia, unspecified: D64.9

## 2012-04-28 HISTORY — DX: Heart failure, unspecified: I50.9

## 2012-04-28 LAB — DIFFERENTIAL
Basophils Relative: 1 % (ref 0–1)
Eosinophils Absolute: 0.2 10*3/uL (ref 0.0–0.7)
Eosinophils Relative: 2 % (ref 0–5)
Lymphs Abs: 3.6 10*3/uL (ref 0.7–4.0)
Monocytes Absolute: 0.8 10*3/uL (ref 0.1–1.0)
Monocytes Relative: 8 % (ref 3–12)
Neutrophils Relative %: 54 % (ref 43–77)

## 2012-04-28 LAB — BASIC METABOLIC PANEL
BUN: 12 mg/dL (ref 6–23)
Calcium: 8.9 mg/dL (ref 8.4–10.5)
Creatinine, Ser: 0.78 mg/dL (ref 0.50–1.10)
GFR calc Af Amer: >90 mL/min (ref >90)
GFR calc non Af Amer: 87 mL/min — ABNORMAL LOW (ref >90)
Glucose, Bld: 136 mg/dL — ABNORMAL HIGH (ref 70–99)
Potassium: 3.6 mEq/L (ref 3.5–5.1)

## 2012-04-28 LAB — CBC
Hemoglobin: 11.1 g/dL — ABNORMAL LOW (ref 12.0–15.0)
MCH: 26.5 pg (ref 26.0–34.0)
MCHC: 30.7 g/dL (ref 30.0–36.0)
MCV: 86.2 fL (ref 78.0–100.0)
RBC: 4.19 MIL/uL (ref 3.87–5.11)

## 2012-04-28 NOTE — ED Notes (Signed)
PT. REPORTS SOB WITH DRY COUGH AND CHEST HEAVINESS/TIGHTNESS ONSET YESTERDAY.

## 2012-04-28 NOTE — H&P (Signed)
[name] is an 61 y.o. female.   Chief Complaint: Dyspnea, CHF HPI: 61 yo F with multiple medical problems including HTN, HLD, DM, obesity and asthma who presents with worsening SOB.  Patient reports that she has had worsening SOB, especially with exertion, over the past week.  She reports worsening BLE edema over the past weeks to months.  She also had an episode of chest pain today, which was achy.  It happened when she was bending over, putting wash into the dryer.  Pain radiated to her left shoulder.  She had SOB, dizziness, and maybe some diaphoresis.  No sweating.  She denies any change in diet over the past few weeks and thinks she has been urinating normally.  Denies increased salt intake.  Denies missing medications.  Albuterol has not been helping her SOB.  She cannot recall any recent viral illness.  She denies cough or sputum associated with her SOB. No fevers or chills. No decreased po.    While in the ED, her BNP was elevated to 2215, EKG was NSR, BMET and CBC were relatively unremarkable. She had a negative POC troponin of 0.00.  She has not required O2.    Past Medical History  Diagnosis Date  . Hypertension   . Diabetes mellitus   . Depression   . Asthma   . Eczema   . Arthritis   . Cataract   . GERD (gastroesophageal reflux disease)   . History of kidney stones     Past Surgical History  Procedure Date  . Cystoscopy w/ litholapaxy / ehl   . Cholecystectomy 2003  . Replacement total knee bilateral 2005 &2006    Family History  Problem Relation Age of Onset  . Diabetes Mother   . Stroke Mother   . Heart disease Father   . Anemia Father    Social History:  reports that she has never smoked. She has never used smokeless tobacco. She reports that she does not drink alcohol or use illicit drugs.  Allergies: No Known Allergies   (Not in a hospital admission)  Results for orders placed during the hospital encounter of 04/28/12 (from the past 48 hour(s))  CBC      Status: Abnormal   Collection Time   04/28/12  7:54 PM      Component Value Range Comment   WBC 10.1  4.0 - 10.5 (K/uL)    RBC 4.19  3.87 - 5.11 (MIL/uL)    Hemoglobin 11.1 (*) 12.0 - 15.0 (g/dL)    HCT 40.9  81.1 - 91.4 (%)    MCV 86.2  78.0 - 100.0 (fL)    MCH 26.5  26.0 - 34.0 (pg)    MCHC 30.7  30.0 - 36.0 (g/dL)    RDW 78.2 (*) 95.6 - 15.5 (%)    Platelets 268  150 - 400 (K/uL)   DIFFERENTIAL     Status: Normal   Collection Time   04/28/12  7:54 PM      Component Value Range Comment   Neutrophils Relative 54  43 - 77 (%)    Neutro Abs 5.5  1.7 - 7.7 (K/uL)    Lymphocytes Relative 35  12 - 46 (%)    Lymphs Abs 3.6  0.7 - 4.0 (K/uL)    Monocytes Relative 8  3 - 12 (%)    Monocytes Absolute 0.8  0.1 - 1.0 (K/uL)    Eosinophils Relative 2  0 - 5 (%)    Eosinophils Absolute  0.2  0.0 - 0.7 (K/uL)    Basophils Relative 1  0 - 1 (%)    Basophils Absolute 0.1  0.0 - 0.1 (K/uL)   BASIC METABOLIC PANEL     Status: Abnormal   Collection Time   04/28/12  7:54 PM      Component Value Range Comment   Sodium 141  135 - 145 (mEq/L)    Potassium 3.6  3.5 - 5.1 (mEq/L)    Chloride 103  96 - 112 (mEq/L)    CO2 26  19 - 32 (mEq/L)    Glucose, Bld 136 (*) 70 - 99 (mg/dL)    BUN 12  6 - 23 (mg/dL)    Creatinine, Ser 1.61  0.50 - 1.10 (mg/dL)    Calcium 8.9  8.4 - 10.5 (mg/dL)    GFR calc non Af Amer 87 (*) >90 (mL/min)    GFR calc Af Amer >90  >90 (mL/min)   POCT I-STAT TROPONIN I     Status: Normal   Collection Time   04/28/12  8:08 PM      Component Value Range Comment   Troponin i, poc 0.00  0.00 - 0.08 (ng/mL)    Comment 3            PRO B NATRIURETIC PEPTIDE     Status: Abnormal   Collection Time   04/28/12 10:19 PM      Component Value Range Comment   Pro B Natriuretic peptide (BNP) 2215.0 (*) 0 - 125 (pg/mL)    Dg Chest 2 View  04/28/2012  *RADIOLOGY REPORT*  Clinical Data: Shortness of breath and chest pain  CHEST - 2 VIEW  Comparison: 01/04/2007  Findings: The heart size is  moderate to markedly enlarged.  This is a new finding when compared with 01/04/2007.  This may represent multilevel chamber cardiomegaly or a pericardial effusion.  No pleural effusion or edema.  No airspace consolidation.  IMPRESSION:  1.  Significant increase in size of cardiac silhouette which may be due to multichamber cardiomegaly or a pericardial effusion.  Original Report Authenticated By: Rosealee Albee, M.D.    Review of Systems  Constitutional: Negative for fever, chills, weight loss and diaphoresis.  HENT: Negative for congestion and sore throat.   Eyes: Negative for blurred vision.  Respiratory: Positive for shortness of breath. Negative for cough, sputum production and wheezing.   Cardiovascular: Positive for chest pain, orthopnea and leg swelling. Negative for palpitations and claudication.  Gastrointestinal: Negative for nausea, vomiting, abdominal pain, diarrhea, constipation, blood in stool and melena.  Genitourinary: Negative for dysuria.  Musculoskeletal: Negative for falls.  Skin: Negative for rash.  Neurological: Positive for dizziness. Negative for sensory change, focal weakness and headaches.    Blood pressure 155/105, pulse 91, temperature 97 F (36.1 C), temperature source Oral, resp. rate 16, SpO2 98.00%. Physical Exam  Constitutional: She appears well-developed and well-nourished. No distress.  HENT:  Head: Normocephalic and atraumatic.  Mouth/Throat: Oropharynx is clear and moist. No oropharyngeal exudate.  Eyes: Conjunctivae and EOM are normal. Pupils are equal, round, and reactive to light.  Neck: Normal range of motion. Neck supple.  Cardiovascular: Regular rhythm and intact distal pulses.  Tachycardia present.        ?soft murmur heard  Respiratory: No accessory muscle usage. Tachypnea noted. No respiratory distress. She has no wheezes. She has no rales.       Mild crackles bilateral bases  GI: Soft. Bowel sounds are normal. She exhibits no  distension.  There is no tenderness.  Musculoskeletal: Normal range of motion. She exhibits edema.  Lymphadenopathy:    She has no cervical adenopathy.  Neurological: She is alert.  Skin: Skin is warm and dry. She is not diaphoretic.     Assessment/Plan 61 yo F with multiple medical problems presents with chest pain and gradually worsening dyspnea  1. Dyspnea: BNP elevated to 2215, new-onset CHF -Most recent ECHO was 2006 (also done for dyspnea and LE edema) but was poor study and no EF was given, but LV function was assumed to be normal; ECHO in 2003 showed EF 50-55% with normal LV function -CXR showing new cardiomegaly (compared to CXR in 2008) -Will get ECHO in the morning -No O2 requirement currently, monitor for need -Appears fluid overloaded, will give 1 dose of IV lasix and monitor for response -Daily weights, monitor I/Os -Check TSH; consider other lab testing for new onset CHF (although pt with multiple RFs including HTN, DM, obesity) in pt with chronic fatigue such as ANA  2. Chest pain: POC trop and initial EKG WNL  -Monitor on tele -Cycle cardiac enzymes, AM EKG -Check risk stratification labs (last TSH nml in 12/2011, A1c 7.6 in 12/2011, FLP with LDL of 66 in 06/2011)  3. HTN: currently elevated BPs, but based on clinic notes, was having some problems with dizziness and hypotension a few months ago -May be elevated due to fluid overload -Continue home regimen, will change as needed (pt only on lotensin 10, K-dur 10) -Will need to optimize regimen now that pt has dx of CHF  4. DM: relatively well controlled at home on metformin -A1c -SSI   5. Asthma -Continue home flovent, albuterol PRN  6. Anemia: normocytic, had recent colonoscopy -monitor hgb  FEN/GI: SLIV, heart healthy diet Ppx: SQ heparin Dispo: admit to tele, d/c pending clinical improvement   Kamelia Lampkins 04/29/2012, 12:11 AM

## 2012-04-28 NOTE — ED Provider Notes (Signed)
History     CSN: [id number]  Arrival date & time 04/28/12  1900   First MD Initiated Contact with Patient 04/28/12 2150      Chief Complaint  Patient presents with  . Shortness of Breath    (Consider location/radiation/quality/duration/timing/severity/associated sxs/prior treatment) HPI Comments: Patient presents with worsening dyspnea on exertion over the last week.  She does note an ongoing history of bilateral lower extremity edema.  This is been going on for weeks to months.  Patient today noted some chest pain that radiates down her left arm.  She had nausea earlier but no nausea now.  Patient notes that her dyspnea is impacting her daily activities and she finds that she has to rest after walking only short distances.  Patient has no known prior cardiac disease.  Patient is a 61 y.o. female presenting with shortness of breath. The history is provided by the patient. No language interpreter was used.  Shortness of Breath  The current episode started 5 to 7 days ago. Associated symptoms include chest pain and shortness of breath. Pertinent negatives include no fever and no cough.    Past Medical History  Diagnosis Date  . Hypertension   . Diabetes mellitus   . Depression   . Asthma   . Eczema   . Arthritis   . Cataract   . GERD (gastroesophageal reflux disease)   . History of kidney stones     Past Surgical History  Procedure Date  . Cystoscopy w/ litholapaxy / ehl   . Cholecystectomy 2003  . Replacement total knee bilateral 2005 &2006    Family History  Problem Relation Age of Onset  . Diabetes Mother   . Stroke Mother   . Heart disease Father   . Anemia Father     History  Substance Use Topics  . Smoking status: Never Smoker   . Smokeless tobacco: Never Used  . Alcohol Use: No    OB History    Grav Para Term Preterm Abortions TAB SAB Ect Mult Living                  Review of Systems  Constitutional: Negative.  Negative for fever and chills.    HENT: Negative.   Eyes: Negative.  Negative for discharge and redness.  Respiratory: Positive for shortness of breath. Negative for cough.   Cardiovascular: Positive for chest pain and leg swelling.  Gastrointestinal: Positive for nausea. Negative for vomiting, abdominal pain and diarrhea.  Genitourinary: Negative.  Negative for dysuria and vaginal discharge.  Musculoskeletal: Negative.  Negative for back pain.  Skin: Negative.  Negative for color change and rash.  Neurological: Negative.  Negative for syncope and headaches.  Hematological: Negative.  Negative for adenopathy.  Psychiatric/Behavioral: Negative.  Negative for confusion.  All other systems reviewed and are negative.    Allergies  Review of patient's allergies indicates no known allergies.  Home Medications   Current Outpatient Rx  Name Route Sig Dispense Refill  . ALBUTEROL 90 MCG/ACT IN AERS Inhalation Inhale 1-2 puffs into the lungs 4 (four) times daily as needed. For shortness of breath    . BENAZEPRIL HCL 10 MG PO TABS Oral Take 10 mg by mouth daily.    . DESIPRAMINE HCL 100 MG PO TABS Oral Take 100 mg by mouth at bedtime.    Marland Kitchen DICLOFENAC SODIUM 75 MG PO TBEC Oral Take 75 mg by mouth 2 (two) times daily.    . OMEGA-3 FATTY ACIDS 1000 MG PO  CAPS Oral Take 1 g by mouth 2 (two) times daily.    Marland Kitchen FLUTICASONE PROPIONATE  HFA 110 MCG/ACT IN AERO Inhalation Inhale 2 puffs into the lungs 2 (two) times daily.    Marland Kitchen GABAPENTIN 300 MG PO CAPS Oral Take 300 mg by mouth 3 (three) times daily. Dr Ralene Cork     . METFORMIN HCL 500 MG PO TABS Oral Take 500 mg by mouth 2 (two) times daily with a meal.    . OXICONAZOLE NITRATE 1 % EX CREA Topical Apply 1 % topically 2 (two) times daily. For itching    . POTASSIUM CITRATE ER 10 MEQ (1080 MG) PO TBCR Oral Take 10 mEq by mouth daily.     Marland Kitchen RANITIDINE HCL 150 MG PO TABS Oral Take 150 mg by mouth at bedtime.      BP 156/110  Pulse 100  Temp(Src) 98 F (36.7 C) (Oral)  Resp 18  SpO2  96%  Physical Exam  Nursing note and vitals reviewed. Constitutional: She is oriented to person, place, and time. She appears well-developed and well-nourished.  Non-toxic appearance. She does not have a sickly appearance.  HENT:  Head: Normocephalic and atraumatic.  Eyes: Conjunctivae, EOM and lids are normal. Pupils are equal, round, and reactive to light. No scleral icterus.  Neck: Trachea normal and normal range of motion. Neck supple.  Cardiovascular: Normal rate, regular rhythm and normal heart sounds.   Pulmonary/Chest: Effort normal and breath sounds normal. No respiratory distress. She has no wheezes. She has no rales.       Left lower lobe crackles noted on exam  Abdominal: Soft. Normal appearance. There is no tenderness. There is no rebound, no guarding and no CVA tenderness.  Musculoskeletal: Normal range of motion. She exhibits edema.       Bilateral lower extremity edema that is symmetrical and present up to the patient's knees  Neurological: She is alert and oriented to person, place, and time. She has normal strength.  Skin: Skin is warm, dry and intact. No rash noted.  Psychiatric: She has a normal mood and affect. Her behavior is normal. Judgment and thought content normal.    ED Course  Procedures (including critical care time)  Results for orders placed during the hospital encounter of 04/28/12  CBC      Component Value Range   WBC 10.1  4.0 - 10.5 (K/uL)   RBC 4.19  3.87 - 5.11 (MIL/uL)   Hemoglobin 11.1 (*) 12.0 - 15.0 (g/dL)   HCT 16.1  09.6 - 04.5 (%)   MCV 86.2  78.0 - 100.0 (fL)   MCH 26.5  26.0 - 34.0 (pg)   MCHC 30.7  30.0 - 36.0 (g/dL)   RDW 40.9 (*) 81.1 - 15.5 (%)   Platelets 268  150 - 400 (K/uL)  DIFFERENTIAL      Component Value Range   Neutrophils Relative 54  43 - 77 (%)   Neutro Abs 5.5  1.7 - 7.7 (K/uL)   Lymphocytes Relative 35  12 - 46 (%)   Lymphs Abs 3.6  0.7 - 4.0 (K/uL)   Monocytes Relative 8  3 - 12 (%)   Monocytes Absolute 0.8   0.1 - 1.0 (K/uL)   Eosinophils Relative 2  0 - 5 (%)   Eosinophils Absolute 0.2  0.0 - 0.7 (K/uL)   Basophils Relative 1  0 - 1 (%)   Basophils Absolute 0.1  0.0 - 0.1 (K/uL)  BASIC METABOLIC PANEL  Component Value Range   Sodium 141  135 - 145 (mEq/L)   Potassium 3.6  3.5 - 5.1 (mEq/L)   Chloride 103  96 - 112 (mEq/L)   CO2 26  19 - 32 (mEq/L)   Glucose, Bld 136 (*) 70 - 99 (mg/dL)   BUN 12  6 - 23 (mg/dL)   Creatinine, Ser 1.61  0.50 - 1.10 (mg/dL)   Calcium 8.9  8.4 - 09.6 (mg/dL)   GFR calc non Af Amer 87 (*) >90 (mL/min)   GFR calc Af Amer >90  >90 (mL/min)  POCT I-STAT TROPONIN I      Component Value Range   Troponin i, poc 0.00  0.00 - 0.08 (ng/mL)   Comment 3           PRO B NATRIURETIC PEPTIDE      Component Value Range   Pro B Natriuretic peptide (BNP) 2215.0 (*) 0 - 125 (pg/mL)   Dg Chest 2 View  04/28/2012  *RADIOLOGY REPORT*  Clinical Data: Shortness of breath and chest pain  CHEST - 2 VIEW  Comparison: 01/04/2007  Findings: The heart size is moderate to markedly enlarged.  This is a new finding when compared with 01/04/2007.  This may represent multilevel chamber cardiomegaly or a pericardial effusion.  No pleural effusion or edema.  No airspace consolidation.  IMPRESSION:  1.  Significant increase in size of cardiac silhouette which may be due to multichamber cardiomegaly or a pericardial effusion.  Original Report Authenticated By: Rosealee Albee, M.D.     1. CHF (congestive heart failure)     Date: 04/28/2012  Rate: 100  Rhythm: normal sinus rhythm  QRS Axis: normal  Intervals: normal  ST/T Wave abnormalities: Flattened T waves  Conduction Disutrbances:none  Narrative Interpretation:   Old EKG Reviewed: changes noted times 01/04/2007 when T waves were upright and without flattening     MDM  Patient with symptoms concerning for new onset CHF.  She has been reporting increasing dyspnea with exertion with bilateral lower extremity edema.  Patient has  new cardiomegaly on her chest x-ray today compared to an old x-ray.  I did do an ultrasound of her heart to look for pleural effusion and did not see any obvious large pleural effusion.  This heightens my concern for CHF.  Patient's elevated BNP of 2215 also correlates with this.  [facility] and I contacted them for admission of this patient since her dyspnea is impacting her daily activities with her lab abnormalities present today.        Nat Christen, MD 04/28/12 [phone]

## 2012-04-29 ENCOUNTER — Encounter (HOSPITAL_COMMUNITY): Payer: Self-pay

## 2012-04-29 DIAGNOSIS — I5023 Acute on chronic systolic (congestive) heart failure: Secondary | ICD-10-CM

## 2012-04-29 LAB — CARDIAC PANEL(CRET KIN+CKTOT+MB+TROPI)
CK, MB: 2.4 ng/mL (ref 0.3–4.0)
Relative Index: 2.3 (ref 0.0–2.5)
Relative Index: 1.5 (ref 0.0–2.5)
Total CK: 86 U/L (ref 7–177)
Total CK: 117 U/L (ref 7–177)
Total CK: 160 U/L (ref 7–177)

## 2012-04-29 LAB — CBC
HCT: 37 % (ref 36.0–46.0)
MCHC: 31.1 g/dL (ref 30.0–36.0)
Platelets: 270 10*3/uL (ref 150–400)
RDW: 16.4 % — ABNORMAL HIGH (ref 11.5–15.5)

## 2012-04-29 LAB — LIPID PANEL: Cholesterol: 147 mg/dL (ref 0–200)

## 2012-04-29 LAB — COMPREHENSIVE METABOLIC PANEL
Alkaline Phosphatase: 121 U/L — ABNORMAL HIGH (ref 39–117)
BUN: 9 mg/dL (ref 6–23)
Chloride: 98 mEq/L (ref 96–112)
GFR calc Af Amer: >90 mL/min (ref >90)
Glucose, Bld: 206 mg/dL — ABNORMAL HIGH (ref 70–99)
Potassium: 3.2 mEq/L — ABNORMAL LOW (ref 3.5–5.1)
Total Bilirubin: 0.9 mg/dL (ref 0.3–1.2)

## 2012-04-29 LAB — GLUCOSE, CAPILLARY

## 2012-04-29 MED ORDER — HYDRALAZINE HCL 20 MG/ML IJ SOLN
5.0000 mg | Freq: Four times a day (QID) | INTRAMUSCULAR | Status: DC | PRN
  Filled 2012-04-29: qty 0.25

## 2012-04-29 MED ORDER — ASPIRIN 81 MG PO CHEW
81.0000 mg | CHEWABLE_TABLET | Freq: Every day | ORAL | Status: DC
  Administered 2012-04-29 – 2012-05-01 (×3): 81 mg via ORAL
  Filled 2012-04-29 (×3): qty 1

## 2012-04-29 MED ORDER — POTASSIUM CHLORIDE CRYS ER 20 MEQ PO TBCR
40.0000 meq | EXTENDED_RELEASE_TABLET | Freq: Once | ORAL | Status: AC
  Administered 2012-04-29: 40 meq via ORAL
  Filled 2012-04-29: qty 2

## 2012-04-29 MED ORDER — ONDANSETRON HCL 4 MG/2ML IJ SOLN
4.0000 mg | Freq: Four times a day (QID) | INTRAMUSCULAR | Status: DC | PRN
  Administered 2012-04-29: 4 mg via INTRAVENOUS
  Filled 2012-04-29: qty 2

## 2012-04-29 MED ORDER — ALBUTEROL 90 MCG/ACT IN AERS: 1.0000 | INHALATION_SPRAY | Freq: Four times a day (QID) | RESPIRATORY_TRACT | Status: DC | PRN

## 2012-04-29 MED ORDER — FUROSEMIDE 10 MG/ML IJ SOLN
40.0000 mg | Freq: Once | INTRAMUSCULAR | Status: AC
  Administered 2012-04-29: 40 mg via INTRAVENOUS
  Filled 2012-04-29: qty 4

## 2012-04-29 MED ORDER — SODIUM CHLORIDE 0.9 % IJ SOLN
3.0000 mL | Freq: Two times a day (BID) | INTRAMUSCULAR | Status: DC
  Administered 2012-04-29 – 2012-05-02 (×7): 3 mL via INTRAVENOUS

## 2012-04-29 MED ORDER — ALBUTEROL SULFATE HFA 108 (90 BASE) MCG/ACT IN AERS
1.0000 | INHALATION_SPRAY | RESPIRATORY_TRACT | Status: DC | PRN
  Filled 2012-04-29: qty 6.7

## 2012-04-29 MED ORDER — METFORMIN HCL 500 MG PO TABS
500.0000 mg | ORAL_TABLET | Freq: Two times a day (BID) | ORAL | Status: DC
  Administered 2012-04-29 – 2012-05-01 (×5): 500 mg via ORAL
  Filled 2012-04-29 (×7): qty 1

## 2012-04-29 MED ORDER — DESIPRAMINE HCL 50 MG PO TABS
100.0000 mg | ORAL_TABLET | Freq: Every day | ORAL | Status: DC
  Administered 2012-04-29 – 2012-05-02 (×4): 100 mg via ORAL
  Filled 2012-04-29 (×5): qty 2

## 2012-04-29 MED ORDER — OMEGA-3-ACID ETHYL ESTERS 1 G PO CAPS
1.0000 g | ORAL_CAPSULE | Freq: Two times a day (BID) | ORAL | Status: DC
  Administered 2012-04-29 – 2012-05-03 (×8): 1 g via ORAL
  Filled 2012-04-29 (×10): qty 1

## 2012-04-29 MED ORDER — DICLOFENAC SODIUM 75 MG PO TBEC
75.0000 mg | DELAYED_RELEASE_TABLET | Freq: Two times a day (BID) | ORAL | Status: DC
  Administered 2012-04-29 – 2012-05-01 (×6): 75 mg via ORAL
  Filled 2012-04-29 (×7): qty 1

## 2012-04-29 MED ORDER — GABAPENTIN 300 MG PO CAPS
300.0000 mg | ORAL_CAPSULE | Freq: Three times a day (TID) | ORAL | Status: DC
  Administered 2012-04-29 – 2012-05-03 (×12): 300 mg via ORAL
  Filled 2012-04-29 (×15): qty 1

## 2012-04-29 MED ORDER — FLUTICASONE PROPIONATE HFA 110 MCG/ACT IN AERO
2.0000 | INHALATION_SPRAY | Freq: Two times a day (BID) | RESPIRATORY_TRACT | Status: DC
  Administered 2012-04-29 – 2012-05-02 (×8): 2 via RESPIRATORY_TRACT
  Filled 2012-04-29: qty 12

## 2012-04-29 MED ORDER — SODIUM CHLORIDE 0.9 % IJ SOLN: 3.0000 mL | INTRAMUSCULAR | Status: DC | PRN

## 2012-04-29 MED ORDER — FAMOTIDINE 20 MG PO TABS
20.0000 mg | ORAL_TABLET | Freq: Two times a day (BID) | ORAL | Status: DC
  Administered 2012-04-29 – 2012-05-03 (×8): 20 mg via ORAL
  Filled 2012-04-29 (×10): qty 1

## 2012-04-29 MED ORDER — HEPARIN SODIUM (PORCINE) 5000 UNIT/ML IJ SOLN
5000.0000 [IU] | Freq: Three times a day (TID) | INTRAMUSCULAR | Status: DC
  Administered 2012-04-29 – 2012-05-02 (×10): 5000 [IU] via SUBCUTANEOUS
  Filled 2012-04-29 (×13): qty 1

## 2012-04-29 MED ORDER — PNEUMOCOCCAL VAC POLYVALENT 25 MCG/0.5ML IJ INJ
0.5000 mL | INJECTION | INTRAMUSCULAR | Status: AC
  Administered 2012-04-30: 0.5 mL via INTRAMUSCULAR
  Filled 2012-04-29: qty 0.5

## 2012-04-29 MED ORDER — INSULIN ASPART 100 UNIT/ML ~~LOC~~ SOLN
0.0000 [IU] | Freq: Three times a day (TID) | SUBCUTANEOUS | Status: DC
  Administered 2012-04-29 (×2): 2 [IU] via SUBCUTANEOUS
  Administered 2012-04-29: 1 [IU] via SUBCUTANEOUS
  Administered 2012-04-30: 2 [IU] via SUBCUTANEOUS
  Administered 2012-04-30: 1 [IU] via SUBCUTANEOUS
  Administered 2012-05-01: 2 [IU] via SUBCUTANEOUS
  Administered 2012-05-01 (×2): 1 [IU] via SUBCUTANEOUS
  Administered 2012-05-02 – 2012-05-03 (×2): 2 [IU] via SUBCUTANEOUS

## 2012-04-29 MED ORDER — OMEGA-3 FATTY ACIDS 1000 MG PO CAPS: 1.0000 g | ORAL_CAPSULE | Freq: Two times a day (BID) | ORAL | Status: DC

## 2012-04-29 MED ORDER — ACETAMINOPHEN 650 MG RE SUPP: 650.0000 mg | Freq: Four times a day (QID) | RECTAL | Status: DC | PRN

## 2012-04-29 MED ORDER — ALUM & MAG HYDROXIDE-SIMETH 200-200-20 MG/5ML PO SUSP: 30.0000 mL | Freq: Four times a day (QID) | ORAL | Status: DC | PRN

## 2012-04-29 MED ORDER — SODIUM CHLORIDE 0.9 % IJ SOLN
3.0000 mL | Freq: Two times a day (BID) | INTRAMUSCULAR | Status: DC
  Administered 2012-04-29: 3 mL via INTRAVENOUS

## 2012-04-29 MED ORDER — BENAZEPRIL HCL 10 MG PO TABS
10.0000 mg | ORAL_TABLET | Freq: Every day | ORAL | Status: DC
  Administered 2012-04-29: 10 mg via ORAL
  Filled 2012-04-29 (×2): qty 1

## 2012-04-29 MED ORDER — SODIUM CHLORIDE 0.9 % IV SOLN: 250.0000 mL | INTRAVENOUS | Status: DC | PRN

## 2012-04-29 MED ORDER — ONDANSETRON HCL 4 MG PO TABS: 4.0000 mg | ORAL_TABLET | Freq: Four times a day (QID) | ORAL | Status: DC | PRN

## 2012-04-29 MED ORDER — METOPROLOL TARTRATE 25 MG PO TABS
25.0000 mg | ORAL_TABLET | Freq: Two times a day (BID) | ORAL | Status: DC
  Administered 2012-04-29 – 2012-05-02 (×7): 25 mg via ORAL
  Filled 2012-04-29 (×10): qty 1

## 2012-04-29 MED ORDER — ACETAMINOPHEN 325 MG PO TABS
650.0000 mg | ORAL_TABLET | Freq: Four times a day (QID) | ORAL | Status: DC | PRN
  Administered 2012-04-29 – 2012-05-02 (×4): 650 mg via ORAL
  Filled 2012-04-29 (×4): qty 2

## 2012-04-29 NOTE — ED Notes (Signed)
IV team paged and are aware of patient in need of peripheral IV

## 2012-04-29 NOTE — Progress Notes (Signed)
Subjective: Feels a little better today, less SOB, but still ha some dyspnea.  Overall is feeling ok.  Questioning when she can go home.  Had a lot of urine output last night after the lasix. No more chest pain, but is complaining of some indigestion (has h/o GERD).  Objective: Vital signs in last 24 hours: Temp:  [97 F (36.1 C)-98.5 F (36.9 C)] 97.3 F (36.3 C) (06/01 0256) Pulse Rate:  [89-103] 100  (06/01 0256) Resp:  [16-26] 22  (06/01 0256) BP: (155-168)/(100-110) 164/103 mmHg (06/01 0256) SpO2:  [96 %-100 %] 100 % (06/01 0256) Weight:  [240 lb 4.8 oz (109 kg)] 240 lb 4.8 oz (109 kg) (06/01 0239) Weight change:  Last BM Date: 04/28/12  Intake/Output from previous day: 05/31 0701 - 06/01 0700 In: 120 [P.O.:120] Out: 3050 [Urine:3050] Intake/Output this shift: Total I/O In: 120 [P.O.:120] Out: 3050 [Urine:3050]  PHYSICAL EXAM Gen: NAD, comfortable in bed HEENT: MMM CV: RRR, no murmur appreciated Pulm: clear, no crackles or wheezes Abd: soft, nontender Ext: 1+ BLE pitting edema, warm, tender to touch (chronic)  Lab Results:  [facility] 04/29/12 0517 04/28/12 1954  WBC 10.0 10.1  HGB 11.5* 11.1*  HCT 37.0 36.1  PLT 270 268   BMET  Basename 04/28/12 1954  NA 141  K 3.6  CL 103  CO2 26  GLUCOSE 136*  BUN 12  CREATININE 0.78  CALCIUM 8.9    Studies/Results: Dg Chest 2 View  04/28/2012  *RADIOLOGY REPORT*  Clinical Data: Shortness of breath and chest pain  CHEST - 2 VIEW  Comparison: 01/04/2007  Findings: The heart size is moderate to markedly enlarged.  This is a new finding when compared with 01/04/2007.  This may represent multilevel chamber cardiomegaly or a pericardial effusion.  No pleural effusion or edema.  No airspace consolidation.  IMPRESSION:  1.  Significant increase in size of cardiac silhouette which may be due to multichamber cardiomegaly or a pericardial effusion.  Original Report Authenticated By: Rosealee Albee, M.D.    Medications:  I  have reviewed the patient's current medications. Scheduled:   . benazepril  10 mg Oral Daily  . desipramine  100 mg Oral QHS  . diclofenac  75 mg Oral BID  . famotidine  20 mg Oral BID  . fluticasone  2 puff Inhalation BID  . furosemide  40 mg Intravenous Once  . gabapentin  300 mg Oral TID  . heparin  5,000 Units Subcutaneous Q8H  . insulin aspart  0-9 Units Subcutaneous TID WC  . metFORMIN  500 mg Oral BID WC  . omega-3 acid ethyl esters  1 g Oral BID  . pneumococcal 23 valent vaccine  0.5 mL Intramuscular Tomorrow-1000  . sodium chloride  3 mL Intravenous Q12H  . sodium chloride  3 mL Intravenous Q12H  . DISCONTD: fish oil-omega-3 fatty acids  1 g Oral BID   Continuous:  HYQ:MVHQIO chloride, acetaminophen, acetaminophen, albuterol, alum & mag hydroxide-simeth, hydrALAZINE, ondansetron (ZOFRAN) IV, ondansetron, sodium chloride, DISCONTD: albuterol  Assessment/Plan: 61 yo F with multiple medical problems presents with chest pain and gradually worsening dyspnea   1. Dyspnea: BNP elevated to 2215, new-onset CHF  -Most recent ECHO was 2006 (also done for dyspnea and LE edema) but was poor study and no EF was given, but LV function was assumed to be normal; ECHO in 2003 showed EF 50-55% with normal LV function  -CXR 5/31 showing new cardiomegaly (compared to CXR in 2008)  -Will get ECHO  today -Still without O2 requirement currently, monitor for need  -Appears fluid overloaded, had good UOP response to lasix 40 IV with some improvement in SOB, still appears to be overloaded so will give additional 40mg  IV this morning -Daily weights, monitor I/Os  -Check TSH; consider other lab testing for new onset CHF (although pt with multiple RFs including HTN, DM, obesity) in pt with chronic fatigue such as ANA  -ASA 81mg  -Consider cards consult since pt will need stress test (vs cath) to r/o CAD, will be especially important if there are any regional wall motion abnormalities on ECHO   2. Chest  pain: POC trop and initial EKG WNL  -Monitor on tele, no events overnight  -Cycle cardiac enzymes, AM EKG --> first set of CE negative, AM EKG showing NSR, ?LVH, prolonged QTc to 520 -Risk stratification labs pending  3. HTN: currently elevated BPs, but based on clinic notes, was having some problems with dizziness and hypotension a few months ago  -May be elevated due to fluid overload  -Continue home regimen of lotensin 10 -Will need to optimize regimen now that pt has dx of CHF --> add metoprolol today  4. DM: relatively well controlled at home on metformin  -A1c pending -SSI   5. Asthma  -Continue home flovent, albuterol PRN   6. Anemia: normocytic, had recent colonoscopy  -monitor hgb, currently stable  FEN/GI: SLIV, heart healthy diet  Ppx: SQ heparin  Dispo: d/c pending clinical improvement, new CHF workup     LOS: 1 day   Verdie Barrows 04/29/2012, 6:25 AM

## 2012-04-29 NOTE — Progress Notes (Signed)
Seen and examined.  See my note from today contained in my co sign of H&PE.

## 2012-04-29 NOTE — Progress Notes (Signed)
  Echocardiogram 2D Echocardiogram has been performed.  [name], Real Cons 04/29/2012, 9:55 AM

## 2012-04-29 NOTE — ED Notes (Signed)
Two RNs looked but patient has poor peripheral veins, Dorathy Daft and Roe Coombs, RN

## 2012-04-29 NOTE — Progress Notes (Signed)
Patient BP 164/103, HR: 100.  MD on call notified. New orders given for PRN Hydralazine if SBP >180.  Patient states that she feels fine. Will continue to monitor.

## 2012-04-29 NOTE — H&P (Signed)
Seen and examined.  Chart reviewed.  Discussed with Dr. Fara Boros.  Agree with her management and documentation.  Briefly  61 yo female with worsening DOE and leg swelling presents with what on initial evaluation as new onset of congestive heart failure.  Echo done and awaiting results.  She has had a brisk diuresis.  Feeling better but not well.  Still with marked edema on exam  Await echo to confirm dx of CHF and type (likely systolic dysfunction)  May need further diagnostic WU for specific etiology of CHF syndrome.  Also needs education and further diuresis.  Will follow.

## 2012-04-30 LAB — CBC
HCT: 38.8 % (ref 36.0–46.0)
MCH: 27.1 pg (ref 26.0–34.0)
MCHC: 31.4 g/dL (ref 30.0–36.0)
RDW: 16.7 % — ABNORMAL HIGH (ref 11.5–15.5)

## 2012-04-30 LAB — BASIC METABOLIC PANEL
BUN: 18 mg/dL (ref 6–23)
Calcium: 9.3 mg/dL (ref 8.4–10.5)
GFR calc Af Amer: 67 mL/min — ABNORMAL LOW (ref >90)
GFR calc non Af Amer: 58 mL/min — ABNORMAL LOW (ref >90)
Potassium: 3.9 mEq/L (ref 3.5–5.1)

## 2012-04-30 LAB — GLUCOSE, CAPILLARY
Glucose-Capillary: 114 mg/dL — ABNORMAL HIGH (ref 70–99)
Glucose-Capillary: 124 mg/dL — ABNORMAL HIGH (ref 70–99)
Glucose-Capillary: 144 mg/dL — ABNORMAL HIGH (ref 70–99)

## 2012-04-30 MED ORDER — FUROSEMIDE 20 MG PO TABS
20.0000 mg | ORAL_TABLET | Freq: Every day | ORAL | Status: DC
  Administered 2012-04-30: 20 mg via ORAL
  Filled 2012-04-30 (×3): qty 1

## 2012-04-30 MED ORDER — BENAZEPRIL HCL 20 MG PO TABS
20.0000 mg | ORAL_TABLET | Freq: Every day | ORAL | Status: DC
  Administered 2012-04-30 – 2012-05-03 (×3): 20 mg via ORAL
  Filled 2012-04-30 (×4): qty 1

## 2012-04-30 NOTE — Progress Notes (Signed)
Walked with patient in hallway on room air, patient's O2 sats on RA before ambulating was 95%; patient's O2 sats while ambulating was 99% on room air; patient did become SOB but O2 sats did not drop_____________D. Manson Passey RN

## 2012-04-30 NOTE — Progress Notes (Signed)
Seen and examined.  Dyspnea improved.  Wt down 10 lbs.  Still with bilateral edema.  Awaiting echo.  Suspect systolic dysfunction CHF.  Begun on ACE.  Will also need B blocker if true.  Given DM and CHF, we might want to get her off the NSAID.  If systolic dysfunction CHF, may need cardiac stress test to rule out CAD as etiology.

## 2012-04-30 NOTE — Progress Notes (Signed)
Subjective: The patient presents to the hospital for dyspnea on exertion.  In the interim echocardiograph has been obtained but not yet read.  Her dyspnea on exertion is improving and she no longer has at rest dyspnea. She feels well otherwise.  Objective: Vital signs in last 24 hours: Temp:  [97.4 F (36.3 C)-98.8 F (37.1 C)] 98.8 F (37.1 C) (06/02 0707) Pulse Rate:  [83-92] 89  (06/02 0707) Resp:  [20-22] 20  (06/02 0707) BP: (132-156)/(87-108) 146/95 mmHg (06/02 0707) SpO2:  [98 %-100 %] 98 % (06/02 0707) Weight:  [229 lb 11.5 oz (104.2 kg)] 229 lb 11.5 oz (104.2 kg) (06/02 0707) Weight change: -4 lb 1.6 oz (-1.86 kg) Last BM Date: 04/29/12  Intake/Output from previous day: 06/01 0701 - 06/02 0700 In: 840 [P.O.:840] Out: 1400 [Urine:1400] Intake/Output this shift:    Exam:  BP 146/95  Pulse 89  Temp(Src) 98.8 F (37.1 C) (Oral)  Resp 20  Ht 5\' 2"  (1.575 m)  Wt 229 lb 11.5 oz (104.2 kg)  BMI 42.02 kg/m2  SpO2 98% Gen: Well NAD HEENT: EOMI,  MMM, no jvd Lungs: CTABL Nl WOB Heart: RRR no MRG Abd: NABS, NT, ND Exts: 1+ BLE pitting edema, warm, tender to touch (chronic)    Lab Results:  [facility] 04/30/12 0737 04/29/12 0517  WBC 11.0* 10.0  HGB 12.2 11.5*  HCT 38.8 37.0  PLT 282 270   BMET  Basename 04/30/12 0737 04/29/12 0517  NA 142 141  K 3.9 3.2*  CL 100 98  CO2 30 25  GLUCOSE 138* 206*  BUN 18 9  CREATININE 1.01 0.69  CALCIUM 9.3 9.0    Studies/Results: Dg Chest 2 View  04/28/2012  *RADIOLOGY REPORT*  Clinical Data: Shortness of breath and chest pain  CHEST - 2 VIEW  Comparison: 01/04/2007  Findings: The heart size is moderate to markedly enlarged.  This is a new finding when compared with 01/04/2007.  This may represent multilevel chamber cardiomegaly or a pericardial effusion.  No pleural effusion or edema.  No airspace consolidation.  IMPRESSION:  1.  Significant increase in size of cardiac silhouette which may be due to multichamber  cardiomegaly or a pericardial effusion.  Original Report Authenticated By: Rosealee Albee, M.D.    Medications:  I have reviewed the patient's current medications. Scheduled:   . aspirin  81 mg Oral Daily  . benazepril  10 mg Oral Daily  . desipramine  100 mg Oral QHS  . diclofenac  75 mg Oral BID  . famotidine  20 mg Oral BID  . fluticasone  2 puff Inhalation BID  . furosemide  40 mg Intravenous Once  . furosemide  20 mg Oral Daily  . gabapentin  300 mg Oral TID  . heparin  5,000 Units Subcutaneous Q8H  . insulin aspart  0-9 Units Subcutaneous TID WC  . metFORMIN  500 mg Oral BID WC  . metoprolol tartrate  25 mg Oral BID  . omega-3 acid ethyl esters  1 g Oral BID  . pneumococcal 23 valent vaccine  0.5 mL Intramuscular Tomorrow-1000  . potassium chloride  40 mEq Oral Once  . sodium chloride  3 mL Intravenous Q12H  . sodium chloride  3 mL Intravenous Q12H   Continuous:  AOZ:HYQMVH chloride, acetaminophen, acetaminophen, albuterol, alum & mag hydroxide-simeth, hydrALAZINE, ondansetron (ZOFRAN) IV, ondansetron, sodium chloride  Assessment/Plan: 61 yo F with multiple medical problems presents with chest pain and gradually worsening dyspnea  1. Dyspnea: BNP elevated to  2215, new-onset CHF  -Most recent ECHO was 2006 (also done for dyspnea and LE edema) but was poor study and no EF was given, but LV function was assumed to be normal; ECHO in 2003 showed EF 50-55% with normal LV function  -CXR 5/31 showing new cardiomegaly (compared to CXR in 2008)  -Awaiting echocardiogram results -Still without O2 requirement currently, monitor for need  -Appears fluid overloaded, had good UOP response to lasix 40 IV with some improvement in SOB). - Trace edema today. Plan to start scheduled oral 20 mg Lasix daily -Daily weights, monitor I/Os   HTN, DM, obesity) in pt with chronic fatigue such as ANA  -ASA 81mg   -Consider cards consult since pt will need stress test (vs cath) to r/o CAD, will be  especially important if there are any regional wall motion abnormalities on ECHO   2. Chest pain: POC trop and initial EKG WNL  -Monitor on tele, no events overnight  - Cardiac enzymes are negative. This patient will likely require stress exam at some point in the next few weeks  3. HTN: currently elevated BPs, but based on clinic notes, was having some problems with dizziness and hypotension a few months ago  -May be elevated due to fluid overload  -Continue home regimen of lotensin 10  -Currently on metoprolol 25 mg twice daily - Plan to increase Lotensin to 20 mg daily and followup tomorrow.  4. DM: relatively well controlled at home on metformin  -A1c pending  -SSI   5. Asthma  -Continue home flovent, albuterol PRN   6. Anemia: normocytic, had recent colonoscopy  -monitor hgb, currently stable  FEN/GI: SLIV, heart healthy diet  Ppx: SQ heparin  Dispo: d/c pending echo results   LOS: 2 days   Pryce Folts 04/30/2012, 9:07 AM

## 2012-05-01 ENCOUNTER — Encounter (HOSPITAL_COMMUNITY): Payer: Self-pay | Admitting: Physician Assistant

## 2012-05-01 DIAGNOSIS — I5021 Acute systolic (congestive) heart failure: Secondary | ICD-10-CM

## 2012-05-01 LAB — GLUCOSE, CAPILLARY
Glucose-Capillary: 133 mg/dL — ABNORMAL HIGH (ref 70–99)
Glucose-Capillary: 159 mg/dL — ABNORMAL HIGH (ref 70–99)
Glucose-Capillary: 138 mg/dL — ABNORMAL HIGH (ref 70–99)
Glucose-Capillary: 164 mg/dL — ABNORMAL HIGH (ref 70–99)

## 2012-05-01 MED ORDER — FUROSEMIDE 40 MG PO TABS
40.0000 mg | ORAL_TABLET | Freq: Every day | ORAL | Status: DC
  Filled 2012-05-01 (×2): qty 1

## 2012-05-01 MED ORDER — ASPIRIN 81 MG PO CHEW
324.0000 mg | CHEWABLE_TABLET | ORAL | Status: AC
  Administered 2012-05-02: 324 mg via ORAL
  Filled 2012-05-01: qty 4

## 2012-05-01 MED ORDER — TRAMADOL HCL 50 MG PO TABS
50.0000 mg | ORAL_TABLET | Freq: Four times a day (QID) | ORAL | Status: DC | PRN
  Administered 2012-05-01 – 2012-05-02 (×3): 50 mg via ORAL
  Filled 2012-05-01 (×3): qty 1

## 2012-05-01 MED ORDER — FUROSEMIDE 40 MG PO TABS
40.0000 mg | ORAL_TABLET | Freq: Once | ORAL | Status: AC
  Administered 2012-05-01: 40 mg via ORAL
  Filled 2012-05-01: qty 1

## 2012-05-01 MED ORDER — FUROSEMIDE 10 MG/ML IJ SOLN
20.0000 mg | Freq: Once | INTRAMUSCULAR | Status: AC
  Administered 2012-05-01: 20 mg via INTRAVENOUS
  Filled 2012-05-01: qty 2

## 2012-05-01 NOTE — Consult Note (Signed)
CARDIOLOGY CONSULT NOTE  Patient ID: [name], MRN: [id number], DOB/AGE: 06-Jul-1951 61 y.o. Admit date: 04/28/2012   Date of Consult: 05/01/2012 Primary Physician: Carney Living, MD, MD Primary Cardiologist: New to [place]  Chief Complaint: SOB Reason for Consult: new CHF, EF 20-25%  HPI: 61 y/o F with no prior cardiac hx but hx of HTN, DM, obesity, asthma presented to [facility] with complaints of worsening SOB and BLE edema. She saw Dr. Antoine Poche in 2010 for CP with stress test recommended at that time but we have no records of report or f/u - she thinks she had stress test but isn't sure. She has had LEE for years but had worsening over the last week or so. She also notes chronic SOB when she bends over but on Friday 5/31 she began to notice SOB was more prominent whenever she tried to do her ADLs. She also endorses a "pinprick" like CP at that time, not worse with activity, no particular exertional component. Because of these sx, she came to the ER. She endorses orthopnea as well but denies syncope, palpitations. No recent viral illnesses.  Cardiac enzymes neg x 3. pBNP 2215. Her initial CXR suggested new cardiomegaly and thus echo was obtained demonstrating EF of 20-25% with diffuse hypokinesis, grade 2 diastolic dysfunction, mild-moderately dilated LV and RV, mildly reduced systolic RV function, mod MR/TR. LV was not well visualized but suspected normal on 2006 echo, done for unknown reasons. SBP was elevated on admission. She was started on IV Lasix 40mg  IV daily on 5/31, 6/1. On 6/2 she was converted to PO Lasix but required a dose of 20mg  IV last night. Weight is down 9 lbs. I/O net -3655 this admission. Her BP meds were also adjusted due to elevated BPs. (Per primary team, adjusting slowly given a hx of dizziness, hypotension.)  Past Medical History  Diagnosis Date  . Hypertension   . Diabetes mellitus   . Depression   . Asthma   . Eczema   . Arthritis   . Cataract    . GERD (gastroesophageal reflux disease)   . History of kidney stones     w/ hx of hydronephrosis - followed by [facility]  . Anemia   . Obesity   . HIV nonspecific serology     2006: indeterminate HIV blood test, seen by ID, felt secondary to cross reacting antibodies with no further workup felt necessary at that time       Most Recent Cardiac Studies: 2D Echo 04/29/12 Study Conclusions - THIS ADMISSION - Left ventricle: The cavity size was mildly to moderately dilated. There was hypertrophy, with an appearance of eccentric hypertrophy. Systolic function was severely reduced. The estimated ejection fraction was in the range of 20% to 25%. Diffuse hypokinesis. Features are consistent with a pseudonormal left ventricular filling pattern, with concomitant abnormal relaxation and increased filling pressure (grade 2 diastolic dysfunction). Doppler parameters are consistent with both elevated ventricular end-diastolic filling pressure and elevated left atrial filling pressure. - Mitral valve: Calcified annulus. Moderate regurgitation. - Left atrium: The atrium was moderately to severely dilated. - Right ventricle: The cavity size was mildly to moderately dilated. Wall thickness was normal. Systolic function was mildly reduced. - Atrial septum: The septum bowed from left to right, consistent with increased left atrial pressure.  - Tricuspid valve: Moderate regurgitation.  - Pulmonary arteries: PA peak pressure: 56mm Hg (S). - Pericardium, extracardiac: A trivial pericardial effusion was identified posterior to the heart. Features were not  consistent with tamponade physiology. Impressions: - Consistent with dilated cardiomyopathy with congestiveheart failure, with reduced cardiac output. The dysfunction is both systolic and diastolic.   06/2005 Echo LEFT VENTRICLE: - The left ventricle was not well visualized. AORTIC VALVE: - The aortic valve was grossly normal. AORTA: - The aortic root was  normal in size. MITRAL VALVE: - The mitral valve was grossly normal. LEFT ATRIUM: - Left atrial size was normal. RIGHT VENTRICLE: - The right ventricle was grossly normal. - The right ventricle was not well visualized. PULMONIC VALVE: - The pulmonic valve was not well visualized. TRICUSPID VALVE: - The tricuspid valve was not well visualized. PERICARDIUM: - There was no pericardial effusion. --------------------------------------------------------------- SUMMARY - Images are limited. However, in the views seen, there is good LV function. RV function is probably OK also. IMPRESSIONS - Images are limited. However, in the views seen, there is good LV function. RV function is probably OK also.   02/2002 Echo SUMMARY Overall left ventricular systolic function was at the lower limits of normal. Left ventricular ejection fraction was estimated , range being 50 % to 55 %.. There was no diagnostic evidence of left ventricular regional wall motion abnormalities. The aortic valve was mildly calcified. There was mild mitral valvular regurgitation. The left atrium was mildly dilated.   Surgical History:  Past Surgical History  Procedure Date  . Cystoscopy w/ litholapaxy / ehl   . Cholecystectomy 2003  . Replacement total knee bilateral 2005 &2006  . Joint replacement     bilateral knee replacement     Home Meds: Prior to Admission medications   Medication Sig Start Date End Date Taking? Authorizing Provider  albuterol (PROVENTIL,VENTOLIN) 90 MCG/ACT inhaler Inhale 1-2 puffs into the lungs 4 (four) times daily as needed. For shortness of breath   Yes Historical Provider, MD  benazepril (LOTENSIN) 10 MG tablet Take 10 mg by mouth daily.   Yes Historical Provider, MD  desipramine (NOPRAMIN) 100 MG tablet Take 100 mg by mouth at bedtime.   Yes Historical Provider, MD  diclofenac (VOLTAREN) 75 MG EC tablet Take 75 mg by mouth 2 (two) times daily.   Yes Historical Provider, MD  fish oil-omega-3  fatty acids 1000 MG capsule Take 1 g by mouth 2 (two) times daily.   Yes Historical Provider, MD  fluticasone (FLOVENT HFA) 110 MCG/ACT inhaler Inhale 2 puffs into the lungs 2 (two) times daily.   Yes Historical Provider, MD  gabapentin (NEURONTIN) 300 MG capsule Take 300 mg by mouth 3 (three) times daily. Dr Ralene Cork    Yes Historical Provider, MD  metFORMIN (GLUCOPHAGE) 500 MG tablet Take 500 mg by mouth 2 (two) times daily with a meal.   Yes Historical Provider, MD  oxiconazole (OXISTAT) 1 % CREA Apply 1 % topically 2 (two) times daily. For itching   Yes Historical Provider, MD  potassium citrate (UROCIT-K 10) 10 MEQ (1080 MG) SR tablet Take 10 mEq by mouth daily.    Yes Historical Provider, MD  ranitidine (ZANTAC) 150 MG tablet Take 150 mg by mouth at bedtime.   Yes Historical Provider, MD    Inpatient Medications:    . aspirin  81 mg Oral Daily  . benazepril  20 mg Oral Daily  . desipramine  100 mg Oral QHS  . diclofenac  75 mg Oral BID  . famotidine  20 mg Oral BID  . fluticasone  2 puff Inhalation BID  . furosemide  20 mg Intravenous Once  . furosemide  20  mg Oral Daily  . gabapentin  300 mg Oral TID  . heparin  5,000 Units Subcutaneous Q8H  . insulin aspart  0-9 Units Subcutaneous TID WC  . metFORMIN  500 mg Oral BID WC  . metoprolol tartrate  25 mg Oral BID  . omega-3 acid ethyl esters  1 g Oral BID  . sodium chloride  3 mL Intravenous Q12H  . DISCONTD: sodium chloride  3 mL Intravenous Q12H    Allergies: No Known Allergies  History   Social History  . Marital Status: Married    Spouse Name: [name]    Number of Children: 2  . Years of Education: some colle   Occupational History  . retired-CNA, housekeeping    Social History Main Topics  . Smoking status: Never Smoker   . Smokeless tobacco: Never Used  . Alcohol Use: No  . Drug Use: No  . Sexually Active: No   Other Topics Concern  . Not on file   Social History Narrative   Health Care POA: Emergency  Contact: son, Nuvia Hileman [phone] of Life Plan: Who lives with you: son Enriqueta Shutter and husband Lillette Boxer- Keaja is his primary care giverAny pets: Charlesetta Ivory- CocoDiet: Pt has a varied diet.  Is currently working on smaller portions sizes and no late night snacking for weight loss.Exercise: Pt exercises 1x weekly with church group and reports exercising at home 2-3x a week.Seatbelts: Pt reports wearing seatbelt when in vehicles. Wynelle Link Exposure/Protection: Hobbies: word searches, church, time with family and friends     Family History  Problem Relation Age of Onset  . Diabetes Mother   . Stroke Mother   . Heart disease Father   . Anemia Father      Review of Systems: General: negative for chills, fever, night sweats Cardiovascular: see above Dermatological: negative for rash Respiratory: negative for cough or wheezing Urologic: negative for hematuria Abdominal: negative for nausea, vomiting, diarrhea, bright red blood per rectum, melena, or hematemesis Neurologic: negative for visual changes, syncope, or dizziness All other systems reviewed and are otherwise negative except as noted above.  Labs:  [facility] 04/29/12 2020 04/29/12 1059 04/29/12 0517  CKTOTAL 160 117 86  CKMB 2.4 2.7 2.3  TROPONINI <0.30 <0.30 <0.30   Lab Results  Component Value Date   WBC 11.0* 04/30/2012   HGB 12.2 04/30/2012   HCT 38.8 04/30/2012   MCV 86.0 04/30/2012   PLT 282 04/30/2012    Lab 04/30/12 0737 04/29/12 0517  NA 142 --  K 3.9 --  CL 100 --  CO2 30 --  BUN 18 --  CREATININE 1.01 --  CALCIUM 9.3 --  PROT -- 7.2  BILITOT -- 0.9  ALKPHOS -- 121*  ALT -- 125*  AST -- 72*  GLUCOSE 138* --   Lab Results  Component Value Date   CHOL 147 04/29/2012   HDL 53 04/29/2012   LDLCALC 67 04/29/2012   TRIG 133 04/29/2012   Radiology/Studies:  1. Chest 2 View 04/28/2012  *RADIOLOGY REPORT*  Clinical Data: Shortness of breath and chest pain  CHEST - 2 VIEW  Comparison: 01/04/2007  Findings: The heart  size is moderate to markedly enlarged.  This is a new finding when compared with 01/04/2007.  This may represent multilevel chamber cardiomegaly or a pericardial effusion.  No pleural effusion or edema.  No airspace consolidation.  IMPRESSION:  1.  Significant increase in size of cardiac silhouette which may be due to multichamber cardiomegaly or a  pericardial effusion.  Original Report Authenticated By: Rosealee Albee, M.D.   EKG:  04/28/12 19:27 - 98bpm possible L atrial enlargement, nonspecific ST-T changes (TWI avL) 04/29/12 02:18 - 98bpm similar to 04/28/12, nonspecific ST-T changes  (TWI avL). QTc 500 04/29/12 04:44 - 96bpm NSST changes (TWI again avL, similar appearance of biphasic T's V2 to admit EKG) - QTc 520  Physical Exam: Blood pressure 141/97, pulse 107, temperature 98 F (36.7 C), temperature source Oral, resp. rate 19, height 5\' 2"  (1.575 m), weight 231 lb 14.8 oz (105.2 kg), SpO2 100.00%. General: Well developed but chronically ill appearing AAF, obese, in no acute distress. Head: Normocephalic, atraumatic, sclera slightly icteric. No xanthomas, nares are without discharge.  Neck: No obvious masses. JVD is slightly elevated, no notable HJR. Lungs: Decreased BS throughout but no wheezes, rales, or rhonchi. Breathing is unlabored. Heart: RRR with S1 S2, +S3. Soft SEM at apex. No rubs. Abdomen: Soft, non-tender, non-distended with normoactive bowel sounds. No hepatomegaly. No rebound/guarding. No obvious abdominal masses. Msk:  Strength and tone appear normal for age. Extremities: No clubbing or cyanosis. 1+ LE edema.  Distal pedal pulses are 1+ and equal bilaterally. Neuro: Alert and oriented X 3. Moves all extremities spontaneously. Psych:  Responds to questions appropriately with a normal affect.   Assessment and Plan:   1.  Acute systolic CHF secondary to newly diagnosed cardiomyopathy - With diffuse hypokinesis, likely NICM (?etiology) although with multiple cardiac risk  factors, needs R/L heart cath to help rule out obstructive dz which we will plan for tomorrow. The patient is agreeable. Per discussion with Dr. Eden Emms, continue current Lasix dosing given plan for cath. Note: patient has a hx of an indeterminant HIV test back in 2006 - given chronically ill appearance and CHF, will also recheck HIV status. Continue ASA, BB, ACEI. Will also send for lipid panel for risk stratification.  2. Valvular heart disease with mod MR/TR - will need to be followed. 3. QTc prolongation - would recommend primary team transition to a different class of meds instead of tricyclic antidepressants to see if this helps. Note she had slight QTc prolongation in 2010 office note by Dr. Antoine Poche.  4. Hypertension - continue BB, ACEI. With decreased EF, can also consider changing metoprolol to coreg. May need to increase BB if HR/BP remain elevated, keeping in mind pt also has hx of hypotension per IM. 5. Diabetes mellitus - will D/C Metformin given impending cath. Consider alternative agent given CHF.  Signed, Ronie Spies PA-C 05/01/2012, 9:51 AM    Patient examined chart reviewed.  Likely nonishemic DCM.  Given extent of LV dysfunction favor right and left heart cath for diagnostic purposes and guide Rx.  Risks discussed patient willing to proceed Re check HIV status  Charlton Haws 1:29 PM 05/01/2012

## 2012-05-01 NOTE — Progress Notes (Signed)
UR Completed. Acadia Thammavong, RN, Nurse Case Manager [phone]     

## 2012-05-01 NOTE — Progress Notes (Signed)
Pt ambulated in hallway on room air. SpO2 ranged from 92%-99% while ambulating. Will continue to monitor pt closely.  Juliane Lack, RN

## 2012-05-01 NOTE — Progress Notes (Signed)
Patient ID: [name], female   DOB: [birth year], 61 y.o.   MRN: [id number] PGY-1 Daily Progress Note Family Medicine Teaching Service Aldine Contes. Marti Sleigh, MD Service Pager: [phone]   Subjective: patient able to ambulate without oxygen yesterday and this morning to bathroom. Says she feels much better and less SOB. Patient laying when I entered room and she quickly sits up at one point due to SOB.   Objective:  Temp:  [97.9 F (36.6 C)-98.7 F (37.1 C)] 98 F (36.7 C) (06/03 0444) Pulse Rate:  [84-107] 107  (06/03 0444) Resp:  [18-20] 19  (06/03 0444) BP: (128-141)/(83-97) 141/97 mmHg (06/03 0444) SpO2:  [98 %-100 %] 100 % (06/03 0444) Weight:  [231 lb 14.8 oz (105.2 kg)] 231 lb 14.8 oz (105.2 kg) (06/03 0444)  Intake/Output Summary (Last 24 hours) at 05/01/12 0821 Last data filed at 05/01/12 0500  Gross per 24 hour  Intake   1425 ml  Output   1150 ml  Net    275 ml    Gen:  NAD, no diaphoresis HEENT: moist mucous membranes, NCAT CV: Regular rate and rhythm, no murmurs rubs or gallops PULM: bibasilar crackles noted. Otherwise, clear to auscultation bilaterally. No wheezes/rhonchi ABD: soft/nontender/nondistended/normal bowel sounds EXT: 1+ pitting LE edema. Extremities warm. Pulses 2+ Neuro: Alert and oriented x3, moves all extremities  Labs and imaging:   CBC  Lab 04/30/12 0737 04/29/12 0517 04/28/12 1954  WBC 11.0* 10.0 10.1  HGB 12.2 11.5* 11.1*  HCT 38.8 37.0 36.1  PLT 282 270 268   BMET/CMET  Lab 04/30/12 0737 04/29/12 0517 04/28/12 1954  NA 142 141 141  K 3.9 3.2* 3.6  CL 100 98 103  CO2 30 25 26   BUN 18 9 12   CREATININE 1.01 0.69 0.78  CALCIUM 9.3 9.0 8.9  PROT -- 7.2 --  BILITOT -- 0.9 --  ALKPHOS -- 121* --  ALT -- 125* --  AST -- 72* --  GLUCOSE 138* 206* 136*    No results found. __________________________________________________________________________________________________________________________________________________ Echo  04/30/12-Study Conclusions  - Left ventricle: The cavity size was mildly to moderately dilated. There was hypertrophy, with an appearance of eccentric hypertrophy. Systolic function was severely reduced. The estimated ejection fraction was in the range of 20% to 25%. Diffuse hypokinesis. Features are consistent with a pseudonormal left ventricular filling pattern, with concomitant abnormal relaxation and increased filling pressure (grade 2 diastolic dysfunction). Doppler parameters are consistent with both elevated ventricular end-diastolic filling pressure and elevated left atrial filling pressure. - Mitral valve: Calcified annulus. Moderate regurgitation. - Left atrium: The atrium was moderately to severely dilated. - Right ventricle: The cavity size was mildly to moderately dilated. Wall thickness was normal. Systolic function was mildly reduced. - Atrial septum: The septum bowed from left to right, consistent with increased left atrial pressure. - Tricuspid valve: Moderate regurgitation. - Pulmonary arteries: PA peak pressure: 56mm Hg (S). - Pericardium, extracardiac: A trivial pericardial effusion was identified posterior to the heart. Features were not consistent with tamponade physiology. Impressions:  - Consistent with dilated cardiomyopathy with congestive heart failure, with reduced cardiac output. The dysfunction is both systolic and diastolic. _____________________________________________________________________________________________________________________________________________________  Assessment  61 yo F with multiple medical problems presents with DOE and found to have new onset CHF   Plan:  1. Dyspnea/new onset CHF: BNP elevated to 2215, new-onset CHF with EF 20-25% and grade II diastolic  -on ace inhibitor and beta blocker. Cards recommends changing to coreg-will consider.   -weight down  almost 10 lbs from admission but up 2 lbs today. Net positive  approximately 200 mL. Had reduced dose of lasix drastically to 20mg  PO daily and got additional 20mg  IV last night-improving diuresis this AM.    -still fluid overloaded. Will increase Lasix to 40mg  daily, negative 3.5 liters since admission.    -Daily weights, monitor I/Os   -Imaging summary   -Most recent ECHO was 2006 (also done for dyspnea and LE edema) but was poor study and no EF was given, but LV function was assumed to be normal; ECHO in 2003 showed EF 50-55% with normal LV function.    -CXR 5/31 showing new cardiomegaly   (compared to CXR in 2008)   -Still without O2 requirement currently, monitor for need. Able to ambulate as well without o2 requirement.   2. Dilated cardiomyopathy. Suspected NICM with unknown etiology per cards. Denies current alcohol use. Possibly ischemic although no known events.   -consulted Cards today for stress test inpt vs outpt vs cath to r/o CAD given EF and wall motion abnormalities.    -cards plans for right/left heart cath on 6/4.    -check HIV given indeterminant test in 2006.    -hold metformin for cath.   3. Rule out MI due to DOE:  POC trop and follow up CE negative x1 and initial EKG WNL   -ASA 81mg    -Monitor on tele, no events overnight   -left heart cath planned  -Risk stratification: LDL <100 at 67. Known DM. TSH wnl. Will cancel lipids for AM.   4. HTN: improved control with increase in Lotensin to 20mg . Patient still not at goal of <130/80 for DM but based on clinic notes, was having some problems with dizziness and hypotension a few months ago so will adjust slowly -May be elevated due to fluid overload  -metoprolol 25 mg twice daily  -Lotensin to 20 mg daily  5. DM: relatively well controlled at home on metformin with all less than 152 -A1c 6.7 -SSI   6. Asthma  -Continue home flovent, albuterol PRN  -has not required albuterol and no wheeze  7. Anemia: normocytic, had recent colonoscopy  -monitor hgb, currently stable   8.  Depression- Cards recommending change from TCA given history of QTc prolongation in 2010 note by Dr. Antoine Poche. QTc at 485 here-borderline.  Will defer to PCP for potential change.   FEN/GI: SLIV, heart healthy diet. NPO  After midnight for cath. AM BMET.  Ppx: SQ heparin  Dispo: pending cath and cards recommendations.   Tana Conch, MD PGY1, Family Medicine Teaching Service [phone]

## 2012-05-01 NOTE — Progress Notes (Signed)
I have seen and examined this patient. I have discussed with Dr Durene Cal.  I agree with their findings and plans as documented in their progress note for today.  Acute Issues 1. Biventricular Heart Failure. - Pulmonary Hypertension, uncertain if primary pulmonary or secondary to left heart failure. - Symptomatically improving with aggressive loop diuresis. - On beta-blocker and ACEI. - Plan: Right and Left Heart cardiac catheterization inhouse.  - _Recommendations: - Stop NSAID (Diclofenac) if possible given systolic heart failure and titrating ACEI and possibility of starting oral aldosterone antagonist in near future and IV contrast load soon.  - Patient may be a candidate AICD if EF< = 35% on optimal medical management for systolic heart failure.  - Agree with switch to oral Loop diuretic.    -

## 2012-05-02 ENCOUNTER — Encounter (HOSPITAL_COMMUNITY)
Admission: EM | Disposition: A | Discharge status: Home / Self Care | Payer: Self-pay | Source: Home / Self Care | Attending: Family Medicine

## 2012-05-02 DIAGNOSIS — I428 Other cardiomyopathies: Secondary | ICD-10-CM

## 2012-05-02 DIAGNOSIS — R0602 Shortness of breath: Secondary | ICD-10-CM

## 2012-05-02 HISTORY — PX: LEFT AND RIGHT HEART CATHETERIZATION WITH CORONARY ANGIOGRAM: SHX5449

## 2012-05-02 LAB — BASIC METABOLIC PANEL
CO2: 27 mEq/L (ref 19–32)
Chloride: 100 mEq/L (ref 96–112)
GFR calc Af Amer: >90 mL/min (ref >90)
Potassium: 4 mEq/L (ref 3.5–5.1)
Sodium: 139 mEq/L (ref 135–145)

## 2012-05-02 LAB — POCT I-STAT 3, ART BLOOD GAS (G3+)
Bicarbonate: 25.2 mEq/L — ABNORMAL HIGH (ref 20.0–24.0)
pCO2 arterial: 43.5 mmHg (ref 35.0–45.0)
pH, Arterial: 7.371 (ref 7.350–7.400)
pO2, Arterial: 126 mmHg — ABNORMAL HIGH (ref 80.0–100.0)

## 2012-05-02 LAB — CBC
HCT: 38 % (ref 36.0–46.0)
Hemoglobin: 12.2 g/dL (ref 12.0–15.0)
MCH: 27.5 pg (ref 26.0–34.0)
MCV: 85.8 fL (ref 78.0–100.0)
Platelets: 269 10*3/uL (ref 150–400)
RBC: 4.43 MIL/uL (ref 3.87–5.11)

## 2012-05-02 LAB — POCT I-STAT 3, VENOUS BLOOD GAS (G3P V)
Acid-base deficit: 2 mmol/L (ref 0.0–2.0)
Bicarbonate: 24.3 mEq/L — ABNORMAL HIGH (ref 20.0–24.0)
O2 Saturation: 53 %
TCO2: 26 mmol/L (ref 0–100)
pO2, Ven: 31 mmHg (ref 30.0–45.0)

## 2012-05-02 LAB — GLUCOSE, CAPILLARY: Glucose-Capillary: 177 mg/dL — ABNORMAL HIGH (ref 70–99)

## 2012-05-02 LAB — PROTIME-INR
INR: 1.12 (ref 0.00–1.49)
Prothrombin Time: 14.6 seconds (ref 11.6–15.2)

## 2012-05-02 SURGERY — LEFT AND RIGHT HEART CATHETERIZATION WITH CORONARY ANGIOGRAM
Anesthesia: LOCAL

## 2012-05-02 MED ORDER — FUROSEMIDE 10 MG/ML IJ SOLN
40.0000 mg | Freq: Two times a day (BID) | INTRAMUSCULAR | Status: DC
  Administered 2012-05-02 – 2012-05-03 (×3): 40 mg via INTRAVENOUS
  Filled 2012-05-02 (×4): qty 4

## 2012-05-02 MED ORDER — NITROGLYCERIN 0.2 MG/ML ON CALL CATH LAB
INTRAVENOUS | Status: AC
  Filled 2012-05-02: qty 1

## 2012-05-02 MED ORDER — HEPARIN SODIUM (PORCINE) 5000 UNIT/ML IJ SOLN
5000.0000 [IU] | Freq: Three times a day (TID) | INTRAMUSCULAR | Status: DC
  Administered 2012-05-03: 5000 [IU] via SUBCUTANEOUS
  Filled 2012-05-02 (×4): qty 1

## 2012-05-02 MED ORDER — SODIUM CHLORIDE 0.9 % IV SOLN
INTRAVENOUS | Status: AC
  Administered 2012-05-02: 14:00:00 via INTRAVENOUS

## 2012-05-02 MED ORDER — LIDOCAINE HCL (PF) 1 % IJ SOLN
INTRAMUSCULAR | Status: AC
  Filled 2012-05-02: qty 30

## 2012-05-02 MED ORDER — HEPARIN (PORCINE) IN NACL 2-0.9 UNIT/ML-% IJ SOLN
INTRAMUSCULAR | Status: AC
  Filled 2012-05-02: qty 2000

## 2012-05-02 MED ORDER — HYDRALAZINE HCL 20 MG/ML IJ SOLN
INTRAMUSCULAR | Status: AC
  Filled 2012-05-02: qty 1

## 2012-05-02 MED ORDER — MIDAZOLAM HCL 2 MG/2ML IJ SOLN
INTRAMUSCULAR | Status: AC
  Filled 2012-05-02: qty 2

## 2012-05-02 MED ORDER — HYDRALAZINE HCL 20 MG/ML IJ SOLN
10.0000 mg | INTRAMUSCULAR | Status: DC | PRN
  Administered 2012-05-02: 10 mg via INTRAVENOUS
  Filled 2012-05-02: qty 0.5

## 2012-05-02 MED ORDER — FUROSEMIDE 10 MG/ML IJ SOLN
INTRAMUSCULAR | Status: AC
  Filled 2012-05-02: qty 4

## 2012-05-02 NOTE — CV Procedure (Signed)
    Cardiac Catheterization Operative Report  [name] [id number] 6/4/201310:18 AM Carney Living, MD, MD  Procedure Performed:  1. Left Heart Catheterization 2. Selective Coronary Angiography 3. Right Heart Catheterization 4. Left ventricular angiogram  Operator: Verne Carrow, MD  Indication:  Cardiomyopathy, dyspnea.                             Procedure Details: The risks, benefits, complications, treatment options, and expected outcomes were discussed with the patient. The patient and/or family concurred with the proposed plan, giving informed consent. The patient was brought to the cath lab after IV hydration was begun and oral premedication was given. The patient was further sedated with Versed and Fentanyl. The right groin was prepped and draped in the usual manner. Using the modified Seldinger access technique, a 5 French sheath was placed in the femoral artery. A 6 French sheath was inserted into the right femoral vein. A balloon tipped catheter was used to perform a right heart catheterization. Standard diagnostic catheters were used to perform selective coronary angiography. A pigtail catheter was used to perform a left ventricular angiogram. There were no immediate complications. The patient was taken to the recovery area in stable condition.   Hemodynamic Findings: Ao:   149/100              LV:  148/21/26 RA:  16              RV:  56/17/20 PA:   59/35 (mean 46)     PCWP:   31 Fick Cardiac Output: 3.54 L/min Fick Cardiac Index:  1.74 L/min/m2 Central Aortic Saturation: 99% Pulmonary Artery Saturation: 53%   Angiographic Findings:  Left main: No evidence of disease.  Left Anterior Descending Artery: Large vessel that courses to the apex. There is a moderate sized diagonal branch. No evidence of obstructive disease.  Circumflex Artery: Large caliber vessel that gives off a large ramus intermediate branch. No evidence of disease.   Right  Coronary Artery: Large, co-dominant vessel with no evidence of disease.   Left Ventricular Angiogram: Severe global LV systolic dysfunction. LVEF of 20%.    Impression: 1. No angiographic evidence of CAD 2. Severe LV systolic dysfunction, global hypokinesis 3. Elevated filling pressures  Recommendations: Will diurese today. Medical management of non-ischemic cardiomyopathy.        Complications:  None; patient tolerated the procedure well.

## 2012-05-02 NOTE — Progress Notes (Signed)
Patient ID: [name], female   DOB: October 15, 1951, 61 y.o.   MRN: [id number] PGY-1 Daily Progress Note Family Medicine Teaching Service Aldine Contes. Marti Sleigh, MD Service Pager: [phone]   Subjective: patient nto available as down for cath.   Objective:  Temp:  [97.6 F (36.4 C)-98 F (36.7 C)] 98 F (36.7 C) (06/04 0405) Pulse Rate:  [84-98] 89  (06/04 0405) Resp:  [18-19] 19  (06/04 0405) BP: (125-145)/(80-97) 137/94 mmHg (06/04 0405) SpO2:  [97 %-100 %] 97 % (06/04 0405) Weight:  [231 lb 4.2 oz (104.9 kg)] 231 lb 4.2 oz (104.9 kg) (06/04 0405)  Intake/Output Summary (Last 24 hours) at 05/02/12 0806 Last data filed at 05/02/12 0100  Gross per 24 hour  Intake    687 ml  Output   1750 ml  Net  -1063 ml   Exam: not available as down for cath.   Labs and imaging:   CBC  Lab 04/30/12 0737 04/29/12 0517 04/28/12 1954  WBC 11.0* 10.0 10.1  HGB 12.2 11.5* 11.1*  HCT 38.8 37.0 36.1  PLT 282 270 268   BMET/CMET  Lab 05/02/12 0700 04/30/12 0737 04/29/12 0517  NA 139 142 141  K 4.0 3.9 3.2*  CL 100 100 98  CO2 27 30 25   BUN 17 18 9   CREATININE 0.79 1.01 0.69  CALCIUM 9.0 9.3 9.0  PROT -- -- 7.2  BILITOT -- -- 0.9  ALKPHOS -- -- 121*  ALT -- -- 125*  AST -- -- 72*  GLUCOSE 140* 138* 206*   Results for orders placed during the hospital encounter of 04/28/12 (from the past 24 hour(s))  HIV ANTIBODY (ROUTINE TESTING)     Status: Normal   Collection Time   05/01/12 11:17 AM      Component Value Range   HIV NON REACTIVE  NON REACTIVE     No results found. __________________________________________________________________________________________________________________________________________________ Echo 04/30/12-Study Conclusions  - Left ventricle: The cavity size was mildly to moderately dilated. There was hypertrophy, with an appearance of eccentric hypertrophy. Systolic function was severely reduced. The estimated ejection fraction was in the range of 20% to  25%. Diffuse hypokinesis. Features are consistent with a pseudonormal left ventricular filling pattern, with concomitant abnormal relaxation and increased filling pressure (grade 2 diastolic dysfunction). Doppler parameters are consistent with both elevated ventricular end-diastolic filling pressure and elevated left atrial filling pressure. - Mitral valve: Calcified annulus. Moderate regurgitation. - Left atrium: The atrium was moderately to severely dilated. - Right ventricle: The cavity size was mildly to moderately dilated. Wall thickness was normal. Systolic function was mildly reduced. - Atrial septum: The septum bowed from left to right, consistent with increased left atrial pressure. - Tricuspid valve: Moderate regurgitation. - Pulmonary arteries: PA peak pressure: 56mm Hg (S). - Pericardium, extracardiac: A trivial pericardial effusion was identified posterior to the heart. Features were not consistent with tamponade physiology. Impressions:  - Consistent with dilated cardiomyopathy with congestive heart failure, with reduced cardiac output. The dysfunction is both systolic and diastolic. _____________________________________________________________________________________________________________________________________________________  Assessment  61 yo F with multiple medical problems presents with DOE and found to have new onset CHF   Plan:  1. Dyspnea/new onset CHF: BNP elevated to 2215, new-onset CHF with EF 20-25% and grade II diastolic  -on ace inhibitor and beta blocker. Cards recommends changing to coreg-will consider.   -weight stable from yesterday. Down 9 lbs overall.   -negative 1L fluid since yesterday. Down almost 5L from admission.    -Lasix  to 40mg  daily   -Daily weights, monitor I/Os   -Imaging summary   -Most recent ECHO was 2006 (also done for dyspnea and LE edema) but was poor study and no EF was given, but LV function was assumed to be normal; ECHO  in 2003 showed EF 50-55% with normal LV function.    -CXR 5/31 showing new cardiomegaly   (compared to CXR in 2008)   -Still without O2 requirement currently, monitor for need. Able to ambulate as well without o2 requirement.   2. Dilated cardiomyopathy. Suspected NICM with unknown etiology per cards. Denies current alcohol use. Possibly ischemic although no known events.   -consulted Cards -  right/left heart cath on 6/4.    -HIV negative   -hold metformin for cath.   3. Rule out MI due to DOE:  POC trop and follow up CE negative x1 and initial EKG WNL   -ASA 81mg    -Monitor on tele, no events overnight   -left heart cath planned  -Risk stratification: LDL <100 at 67. Known DM. TSH wnl.   4. HTN: systolics improved control with increase in Lotensin to 20mg . Patient still not at goal of <130/80 for DM but based on clinic notes, was having some problems with dizziness and hypotension a few months ago so will adjust slowly -May be elevated due to fluid overload  -metoprolol 25 mg twice daily. Likely will increase to 50 BID vs converting to coreg.  -Lotensin to 20 mg daily  5. DM: relatively well controlled with all CBG less than 164 -A1c 6.7 -SSI, hold metformin x 2 days post cath.   6. Asthma  -Continue home flovent, albuterol PRN  -has not required albuterol and no wheeze  7. Anemia: normocytic, had recent colonoscopy  -monitor hgb, currently stable   8. Depression- Cards recommending change from TCA given history of QTc prolongation in 2010 note by Dr. Antoine Poche. QTc at 485 here-borderline.  Will defer to PCP for potential change.   FEN/GI: SLIV, heart healthy diet. NPO After midnight for cath. Electrolytes stable with diuresis. Carb modified after cath.  Ppx: SQ heparin  Dispo: pending cath and cards recommendations.   Tana Conch, MD PGY1, Family Medicine Teaching Service [phone]

## 2012-05-02 NOTE — Progress Notes (Signed)
I have seen and examined this patient. I have discussed with Dr Durene Cal.  I agree with their findings and plans as documented in their progress note for today. Anticipate discharge tomorrow with medications for systolic heart failure including furosemide, benazepril, and metoprolol with plan to titrate to maximal tolerated dose

## 2012-05-02 NOTE — Interval H&P Note (Signed)
History and Physical Interval Note:  05/02/2012 9:27 AM  See Dr. Ricki Miller full consult note from yesterday.  [name]  has presented today for surgery, with the diagnosis of chf  The various methods of treatment have been discussed with the patient and family. After consideration of risks, benefits and other options for treatment, the patient has consented to  Procedure(s) (LRB): LEFT AND RIGHT HEART CATHETERIZATION WITH CORONARY ANGIOGRAM (N/A) as a surgical intervention .  The patients' history has been reviewed, patient examined, no change in status, stable for surgery.  I have reviewed the patients' chart and labs.  Questions were answered to the patient's satisfaction.     Connell Bognar

## 2012-05-03 ENCOUNTER — Encounter (HOSPITAL_COMMUNITY): Payer: Self-pay | Admitting: Family Medicine

## 2012-05-03 DIAGNOSIS — I509 Heart failure, unspecified: Secondary | ICD-10-CM

## 2012-05-03 DIAGNOSIS — I428 Other cardiomyopathies: Secondary | ICD-10-CM | POA: Diagnosis present

## 2012-05-03 HISTORY — DX: Heart failure, unspecified: I50.9

## 2012-05-03 LAB — BASIC METABOLIC PANEL
BUN: 15 mg/dL (ref 6–23)
Creatinine, Ser: 0.9 mg/dL (ref 0.50–1.10)
GFR calc Af Amer: 77 mL/min — ABNORMAL LOW (ref >90)
GFR calc non Af Amer: 67 mL/min — ABNORMAL LOW (ref >90)
Glucose, Bld: 143 mg/dL — ABNORMAL HIGH (ref 70–99)

## 2012-05-03 LAB — GLUCOSE, CAPILLARY: Glucose-Capillary: 154 mg/dL — ABNORMAL HIGH (ref 70–99)

## 2012-05-03 MED ORDER — FUROSEMIDE 40 MG PO TABS: 40.0000 mg | ORAL_TABLET | Freq: Every day | ORAL | Status: DC

## 2012-05-03 MED ORDER — METOPROLOL TARTRATE 25 MG PO TABS: 25.0000 mg | ORAL_TABLET | Freq: Two times a day (BID) | ORAL | Status: DC

## 2012-05-03 MED ORDER — METFORMIN HCL 500 MG PO TABS: 500.0000 mg | ORAL_TABLET | Freq: Two times a day (BID) | ORAL | Status: DC

## 2012-05-03 MED ORDER — BENAZEPRIL HCL 10 MG PO TABS: 20.0000 mg | ORAL_TABLET | Freq: Every day | ORAL | Status: DC

## 2012-05-03 MED ORDER — DESIPRAMINE HCL 50 MG PO TABS: 50.0000 mg | ORAL_TABLET | Freq: Every day | ORAL | Status: DC

## 2012-05-03 NOTE — Progress Notes (Signed)
PCP  Family Medicine Teaching Service Attending Note  I interviewed and examined patient [name] and reviewed their tests and x-rays.  I discussed with Dr. Durene Cal and reviewed their note for today.  I agree with their assessment and plan.     Additionally   Appreciate the great care of the in patient team  She is feeling better and would like to go home  Continue diuretic, acei and bblocker.  Will adjust as outpatient  Would decrease her desipramine to 50 mg qhs and I will assess symtpoms and continue taper as out patient

## 2012-05-03 NOTE — Discharge Summary (Signed)
Physician Discharge Summary  Patient ID: [name] MRN: [id number] DOB: August 28, 1951 Age: 61 y.o.  Admit date: 04/28/2012 Discharge date: 05/03/2012  PCP: Carney Living, MD, MD of [facility]  Consultants:[facility].      Discharge Diagnosis: Principal Problem:  *Non-ischemic cardiomyopathy Active Problems:  DIABETES MELLITUS  DYSLIPIDEMIA  DISORDER, DEPRESSIVE NEC  HYPERTENSION, BENIGN SYSTEMIC  ASTHMA, PERSISTENT, MILD    Hospital Course 61 yo F with multiple medical problems presents with DOE and found to have new onset CHF   1. Dyspnea/new onset CHF: BNP elevated to 2215, new-onset CHF with EF 20-25% and grade II diastolic. See echo report for full details. Also had moderate tricuspid and mitral regurgitation.   -on ace inhibitor and beta blocker. Cards recommends changing to coreg-will consider.   -Weight down 12 lbs during admission corresponding with over 8L diuresis since admission.   -Aggressively diuresed and discharged on Lasix 40mg  PO daily   -patient weaned from oxygen and tolerated walking without o2 requirement.   -Imaging summary    -Most recent ECHO was 2006 (also done for dyspnea and LE edema) but was poor study and no EF was given, but LV function was assumed to be normal; ECHO in 2003 showed EF 50-55% with normal LV function.    -CXR 5/31 showing new cardiomegaly (compared to CXR in 2008)   2. Dilated cardiomyopathy/ NICM. NICM with unknown etiology per cards although could be poorly controlled HTN. Denies current alcohol use.   -catheterization on 6/4 did not show any ischemic origin.   -HIV negative, ordered by cards due to indeterminate test in 2006  3. Rule out MI due to DOE: POC trop and follow up CE negative x1 and initial EKG WNL Monitored on telemetry without events.   -ASA 81mg    -No ischemia noted on catheterization. See cath report.   -Risk stratification: LDL <100 at 67. Known DM. TSH wnl.   4. HTN: systolics  improved control with increase in Lotensin to 20mg . Patient still not at goal of <130/80 for DM but based on clinic notes, was having some problems with dizziness and hypotension a few months ago so will adjust slowly   -May be elevated due to fluid overload.   -metoprolol 25 mg twice daily. Likely will increase to 50 BID vs converting to coreg on outpatient basis.   5. DM: moderate control on SSI.    -A1c 6.7   -SSI, hold metformin x 2 days post cath.   6. Asthma-Continued home flovent, albuterol PRN  7. Anemia: normocytic, had recent colonoscopy. Stable.  8. Depression- Cards recommending change from TCA given history of QTc prolongation in 2010 note by Dr. Antoine Poche. QTc at 485 here-borderline. Decreased medication with further titration per PCP.         Discharge Exam: Temp:  [97.4 F (36.3 C)-98.2 F (36.8 C)] 98.2 F (36.8 C) (06/04 2100) Pulse Rate:  [91-97] 97  (06/04 2100) Resp:  [16-18] 18  (06/04 2100) BP: (122-143)/(77-91) 143/91 mmHg (06/04 2100) SpO2:  [97 %-100 %] 100 % (06/04 2100) Gen: NAD, no diaphoresis, sitting up in bed brushing teeth.  HEENT: moist mucous membranes, NCAT  CV: Regular rate and rhythm, no murmurs rubs or gallops  PULM: CTAB. No wheezes, rales, rhonchi.  ABD: soft/nontender/nondistended/normal bowel sounds  EXT: 1+ pitting LE edema. Extremities warm. Pulses 2+  Neuro: Alert and oriented x3, moves all extremities  Labs  CBC  Lab 05/02/12 1159 04/30/12 0737 04/29/12 0517  WBC 9.2  11.0* 10.0  HGB 12.2 12.2 11.5*  HCT 38.0 38.8 37.0  PLT 269 282 270   BMET  Lab 05/03/12 0500 05/02/12 1159 05/02/12 0700 04/30/12 0737 04/29/12 0517  NA 141 -- 139 142 --  K 3.8 -- 4.0 3.9 --  CL 100 -- 100 100 --  CO2 27 -- 27 30 --  BUN 15 -- 17 18 --  CREATININE 0.90 0.87 0.79 -- --  CALCIUM 9.4 -- 9.0 9.3 --  PROT -- -- -- -- 7.2  BILITOT -- -- -- -- 0.9  ALKPHOS -- -- -- -- 121*  ALT -- -- -- -- 125*  AST -- -- -- -- 72*  GLUCOSE 143* -- 140*  138* --   HIV ANTIBODY (ROUTINE TESTING)     Status: Normal   Collection Time   05/01/12 11:17 AM      Component Value Range Comment   HIV NON REACTIVE  NON REACTIVE     BNP (last 3 results)  Basename 04/28/12 2219  PROBNP 2215.0*   Procedures/Imaging:  Dg Chest 2 View  04/28/2012  *RADIOLOGY REPORT*  Clinical Data: Shortness of breath and chest pain  CHEST - 2 VIEW  Comparison: 01/04/2007  Findings: The heart size is moderate to markedly enlarged.  This is a new finding when compared with 01/04/2007.  This may represent multilevel chamber cardiomegaly or a pericardial effusion.  No pleural effusion or edema.  No airspace consolidation.  IMPRESSION:  1.  Significant increase in size of cardiac silhouette which may be due to multichamber cardiomegaly or a pericardial effusion.  Original Report Authenticated By: Rosealee Albee, M.D.      Patient condition at time of discharge/disposition: stable  Disposition-home   Follow up issues: 1. Dilated with EF during hospitalization in June of 20-25%. Will need to maximize medical therapy. Consider spironolactone. Possible repeat echo in 3 months and if EF still <35, consider AICD.  2. BP slightly elevated in house but history of dizziness/lightheadedness outpatient. Would consider increasing metoprolol or switching to Coreg per cards recommendations.  3. Consider further decrease in TCA due to history of QTc prolongation.   Discharge follow up:   Discharge Orders    Future Appointments: Provider: Department: Dept Phone: Center:   05/08/2012 10:15 AM Brent Bulla, MD [facility] [phone] [facility]   05/17/2012 9:30 AM Carney Living, MD [facility] [phone] [facility]     Future Orders Please Complete By Expires   Diet - low sodium heart healthy      Increase activity slowly      Heart Failure patients record your daily weight using the same scale at the same time of day      STOP any activity that causes chest pain, shortness  of breath, dizziness, sweating, or exessive weakness      (HEART FAILURE PATIENTS) Call MD:  Anytime you have any of the following symptoms: 1) 3 pound weight gain in 24 hours or 5 pounds in 1 week 2) shortness of breath, with or without a dry hacking cough 3) swelling in the hands, feet or stomach 4) if you have to sleep on extra pillows at night in order to breathe.      Beta Blocker already ordered      ACE Inhibitor / ARB already ordered          Hadleigh, Felber  Home Medication Instructions [id number]   Printed on:05/03/12 [id number]  Medication Information  albuterol (PROVENTIL,VENTOLIN) 90 MCG/ACT inhaler Inhale 1-2 puffs into the lungs 4 (four) times daily as needed. For shortness of breath           potassium citrate (UROCIT-K 10) 10 MEQ (1080 MG) SR tablet Take 10 mEq by mouth daily.            gabapentin (NEURONTIN) 300 MG capsule Take 300 mg by mouth 3 (three) times daily. Dr Ralene Cork            diclofenac (VOLTAREN) 75 MG EC tablet Take 75 mg by mouth 2 (two) times daily.           fish oil-omega-3 fatty acids 1000 MG capsule Take 1 g by mouth 2 (two) times daily.           oxiconazole (OXISTAT) 1 % CREA Apply 1 % topically 2 (two) times daily. For itching           fluticasone (FLOVENT HFA) 110 MCG/ACT inhaler Inhale 2 puffs into the lungs 2 (two) times daily.           ranitidine (ZANTAC) 150 MG tablet Take 150 mg by mouth at bedtime.           benazepril (LOTENSIN) 10 MG tablet Take 2 tablets (20 mg total) by mouth daily.           metFORMIN (GLUCOPHAGE) 500 MG tablet Take 1 tablet (500 mg total) by mouth 2 (two) times daily with a meal. Start taking again on 05/04/12           metoprolol tartrate (LOPRESSOR) 25 MG tablet Take 1 tablet (25 mg total) by mouth 2 (two) times daily.           furosemide (LASIX) 40 MG tablet Take 1 tablet (40 mg total) by mouth daily.           desipramine (NORPRAMIN) 50 MG tablet Take 1 tablet (50 mg total) by  mouth at bedtime.                  Tana Conch, MD of [facility] 05/03/2012 7:04 AM

## 2012-05-05 NOTE — Discharge Summary (Signed)
I have reviewed this discharge summary and agree.    

## 2012-05-08 ENCOUNTER — Ambulatory Visit (INDEPENDENT_AMBULATORY_CARE_PROVIDER_SITE_OTHER): Payer: Medicare Other | Admitting: Family Medicine

## 2012-05-08 ENCOUNTER — Encounter: Payer: Self-pay | Admitting: Family Medicine

## 2012-05-08 VITALS — BP 102/68 | HR 76 | Temp 98.0°F | Ht 62.0 in | Wt 220.0 lb

## 2012-05-08 DIAGNOSIS — E119 Type 2 diabetes mellitus without complications: Secondary | ICD-10-CM

## 2012-05-08 DIAGNOSIS — I428 Other cardiomyopathies: Secondary | ICD-10-CM

## 2012-05-08 DIAGNOSIS — I10 Essential (primary) hypertension: Secondary | ICD-10-CM

## 2012-05-08 DIAGNOSIS — R5383 Other fatigue: Secondary | ICD-10-CM

## 2012-05-08 LAB — BASIC METABOLIC PANEL
BUN: 22 mg/dL (ref 6–23)
CO2: 28 mEq/L (ref 19–32)
Glucose, Bld: 111 mg/dL — ABNORMAL HIGH (ref 70–99)
Potassium: 4.6 mEq/L (ref 3.5–5.3)

## 2012-05-08 MED ORDER — BENAZEPRIL HCL 10 MG PO TABS: 20.0000 mg | ORAL_TABLET | Freq: Every day | ORAL | Status: DC

## 2012-05-08 NOTE — Assessment & Plan Note (Signed)
At goal today, actually borderline low (systolic 102). Will need to watch and prevent for any falls in 61 yo F as we approach maximal medical therapy for patient for heart failure.

## 2012-05-08 NOTE — Progress Notes (Signed)
Patient ID: [name], female   DOB: 04/01/1951, 61 y.o.   MRN: [id number]  [name] is a 61 y.o. female who presents to [facility] today for hospital followup for acute decompensated heart failure. While in house she underwent right and left heart catheterization which did not show ischemia as cause for her cardiomyopathy and heart failure. She was diuresed and recovered and therefore discharged home.  Since being home she has had marked reduction in her symptoms. She states that her shortness of breath is controlled and she occasionally has dyspnea on prolonged exertion such as prolonged walking while shopping or lifting heavy objects. However she has no dyspnea at rest or when doing moderate activity. She denies any chest pain. She is currently taking all of her medications. She is continuing with her daily furosemide and states this is helped with her diuresis.  She is weighing herself daily. Her weights have stayed roughly 220 until today when it jumped to 19 pounds. She does not have any trouble with lower extremity edema currently.  On discharge from the hospital she was recommended to have her desipramine decreased to 50 mg daily secondary to concern for prolonged QTC found on EKG. However she has not done this and is still taking 100 mg daily.   The following portions of the patient's history were reviewed and updated as appropriate: allergies, current medications, past medical history, family and social history, and problem list.  Patient is a nonsmoker.   ROS as above otherwise neg. No Chest pain, palpitations, SOB, Fever, Chills, Abd pain, N/V/D.  Medications reviewed. Current Outpatient Prescriptions  Medication Sig Dispense Refill  . albuterol (PROVENTIL,VENTOLIN) 90 MCG/ACT inhaler Inhale 1-2 puffs into the lungs 4 (four) times daily as needed. For shortness of breath      . benazepril (LOTENSIN) 10 MG tablet Take 2 tablets (20 mg total) by mouth daily.  30 tablet  3  .  desipramine (NORPRAMIN) 50 MG tablet Take 1 tablet (50 mg total) by mouth at bedtime.  30 tablet  2  . diclofenac (VOLTAREN) 75 MG EC tablet Take 75 mg by mouth 2 (two) times daily.      Marland Kitchen docusate sodium (COLACE) 100 MG capsule Take 100 mg by mouth daily.      . fish oil-omega-3 fatty acids 1000 MG capsule Take 1 g by mouth 2 (two) times daily.      . fluticasone (FLOVENT HFA) 110 MCG/ACT inhaler Inhale 2 puffs into the lungs 2 (two) times daily.      . furosemide (LASIX) 40 MG tablet Take 1 tablet (40 mg total) by mouth daily.  30 tablet  1  . gabapentin (NEURONTIN) 300 MG capsule Take 300 mg by mouth 3 (three) times daily. Dr Ralene Cork       . metFORMIN (GLUCOPHAGE) 500 MG tablet Take 1 tablet (500 mg total) by mouth 2 (two) times daily with a meal. Start taking again on 05/04/12      . metoprolol tartrate (LOPRESSOR) 25 MG tablet Take 1 tablet (25 mg total) by mouth 2 (two) times daily.  30 tablet  3  . oxiconazole (OXISTAT) 1 % CREA Apply 1 % topically 2 (two) times daily. For itching      . potassium citrate (UROCIT-K 10) 10 MEQ (1080 MG) SR tablet Take 10 mEq by mouth daily.       . ranitidine (ZANTAC) 150 MG tablet Take 150 mg by mouth at bedtime.  Exam:  BP 102/68  Pulse 76  Temp(Src) 98 F (36.7 C) (Oral)  Ht 5\' 2"  (1.575 m)  Wt 220 lb (99.791 kg)  BMI 40.24 kg/m2 Gen: Well NAD HEENT: EOMI,  MMM Lungs: CTABL Nl WOB Heart: RRR no MRG Abd: NABS, NT, ND Exts: Tace edema BL LE's.  LE, warm and well perfused.   No results found for this or any previous visit (from the past 72 hour(s)).

## 2012-05-08 NOTE — Assessment & Plan Note (Addendum)
Doing much better now.  Symptoms controlled. Currently on ACE, Beta blocker and diuretic.   Low EF, maximal medical therapy recommended. However, limited factor may be blood pressure -- systolic 102 today in clinic. Heart failure Class III based on symptoms currently, sprinolactone indicated if blood pressure tolerates.  Will defer to PCP, fu already scheduled June 19.   Checking BMET for potassium today with continued lasix use

## 2012-05-08 NOTE — Assessment & Plan Note (Signed)
Better today. Appears that Desipramine was for sleep. Discussed cutting dose in half (to recommended 50 mg) with further reduction on outpt basis.  Patient able to use "teach-back" method to describe cutting her dose.   FU with PCP in 2 weeks.

## 2012-05-08 NOTE — Patient Instructions (Signed)
The only changes for today are to decrease your Desipramine.   You should be taking 50 mg of this.   Cut the blue pill in half and only take 1/2 pill at night.  Dr. Deirdre Priest may decrease this medicine further at your next visit. Your follow up with him is on June 19.

## 2012-05-08 NOTE — Assessment & Plan Note (Signed)
Fasting AM CBG 136 today.   Strict control versus risk of hypoglycemia. She is still on same regimen she was discharged from hospital.  No changes today.

## 2012-05-17 ENCOUNTER — Ambulatory Visit (INDEPENDENT_AMBULATORY_CARE_PROVIDER_SITE_OTHER): Payer: Medicare Other | Admitting: Family Medicine

## 2012-05-17 ENCOUNTER — Encounter: Payer: Self-pay | Admitting: Family Medicine

## 2012-05-17 VITALS — BP 142/96 | HR 99 | Temp 98.4°F | Ht 62.0 in | Wt 224.0 lb

## 2012-05-17 DIAGNOSIS — I428 Other cardiomyopathies: Secondary | ICD-10-CM

## 2012-05-17 DIAGNOSIS — F329 Major depressive disorder, single episode, unspecified: Secondary | ICD-10-CM

## 2012-05-17 DIAGNOSIS — E119 Type 2 diabetes mellitus without complications: Secondary | ICD-10-CM

## 2012-05-17 MED ORDER — EASY TOUCH LANCETS MISC: Status: DC

## 2012-05-17 NOTE — Assessment & Plan Note (Signed)
Currenlty controlled but will stop metformin due to diagnosis of CHF.   Hopefully can maintain with diet.  If not will consider restarting vs glipizide

## 2012-05-17 NOTE — Assessment & Plan Note (Signed)
Again discussed cutting desipramine back to 50 mg and how to cut pills

## 2012-05-17 NOTE — Patient Instructions (Addendum)
Check you weight every day call us if it is not less than 220 by next week  Stop the Metformin  Take the diclofenac only as needed not every day

## 2012-05-17 NOTE — Progress Notes (Signed)
  Subjective:    Patient ID: [name], female    DOB: 09/13/1951, 61 y.o.   MRN: [id number]  HPI  CHF Follow up hospitilization.  She has gained about 4 lbs and feels she has more fluid.  No change in shortness of breath.  Does have significant fatigue when she exerts herself but no chest pain  DIABETES Disease Monitoring: Blood Sugar ranges-below 150  Polyuria/phagia/dipsia- no      Visual problems- no  Medications: Compliance- daly metformin 500 twice daily  Hypoglycemic symptoms- no  Review of Symptoms - see HPI  PMH - Smoking status noted.      Review of Systems     Objective:   Physical Exam  Alert no acute distress Heart - Regular rate and rhythm.  No murmurs, gallops or rubs.    Lungs:  Normal respiratory effort, chest expands symmetrically. Lungs are clear to auscultation, no crackles or wheezes. Extrem - 1 + edema but wearing compression hose       Assessment & Plan:

## 2012-05-17 NOTE — Assessment & Plan Note (Signed)
Slightly worsened weight will increase lasix and follow closely.  If stable will start spironolactone but will need to be cautious with her K that she takes for renal stones

## 2012-05-23 ENCOUNTER — Telehealth: Payer: Self-pay | Admitting: Family Medicine

## 2012-05-23 MED ORDER — ZOSTER VACCINE LIVE 19400 UNT/0.65ML ~~LOC~~ SOLR: 0.6500 mL | Freq: Once | SUBCUTANEOUS | Status: AC

## 2012-05-23 NOTE — Telephone Encounter (Signed)
Please let her know I sent it in  Thanks  LC  

## 2012-05-23 NOTE — Telephone Encounter (Signed)
Needs a script for shingles shot sent to [facility] - [address]

## 2012-05-24 NOTE — Telephone Encounter (Signed)
Pt just needed to be informed that Rx was sent in.  Called and told her. Jaking Thayer, [name]

## 2012-05-24 NOTE — Telephone Encounter (Signed)
Patient is returning call to Shanda Bumps because she was not able to hear everything when Shanda Bumps called her this morning.  She was entering another office so her phone cut out.

## 2012-05-24 NOTE — Telephone Encounter (Signed)
Called pt and informed, but her phone cut out at the end before I could ask if she understood. [name], [name]

## 2012-05-26 ENCOUNTER — Other Ambulatory Visit: Payer: Self-pay | Admitting: Family Medicine

## 2012-05-29 ENCOUNTER — Other Ambulatory Visit: Payer: Self-pay | Admitting: Family Medicine

## 2012-05-31 ENCOUNTER — Ambulatory Visit (INDEPENDENT_AMBULATORY_CARE_PROVIDER_SITE_OTHER): Payer: Medicare Other | Admitting: Family Medicine

## 2012-05-31 ENCOUNTER — Encounter: Payer: Self-pay | Admitting: Family Medicine

## 2012-05-31 VITALS — BP 118/83 | HR 98 | Ht 62.0 in | Wt 224.0 lb

## 2012-05-31 DIAGNOSIS — E119 Type 2 diabetes mellitus without complications: Secondary | ICD-10-CM

## 2012-05-31 DIAGNOSIS — I10 Essential (primary) hypertension: Secondary | ICD-10-CM

## 2012-05-31 DIAGNOSIS — E669 Obesity, unspecified: Secondary | ICD-10-CM

## 2012-05-31 DIAGNOSIS — I428 Other cardiomyopathies: Secondary | ICD-10-CM

## 2012-05-31 LAB — BASIC METABOLIC PANEL
Calcium: 9.5 mg/dL (ref 8.4–10.5)
Creat: 0.84 mg/dL (ref 0.50–1.10)
Glucose, Bld: 146 mg/dL — ABNORMAL HIGH (ref 70–99)
Sodium: 138 mEq/L (ref 135–145)

## 2012-05-31 MED ORDER — FUROSEMIDE 40 MG PO TABS: 80.0000 mg | ORAL_TABLET | Freq: Every day | ORAL | Status: DC

## 2012-05-31 NOTE — Assessment & Plan Note (Signed)
Fair control. Her weight and edema did not improve so will increase lasix further.  Check labs.   May need to start spironolactone dependng on her K.  She is having some cough if this persists may need to re-eavaluate her ACE use.

## 2012-05-31 NOTE — Progress Notes (Signed)
  Subjective:    Patient ID: [name], female    DOB: [birth year], 61 y.o.   MRN: [id number]  HPI CHF She has not lost any weight despite increased lasix.  She is trying to watch salt.  She feels she has more fluid.  Her shortness of breath and fatigue seem some better.  No chest pain or severe dyspnea She is having some coughing - no sputum or fever  DIABETES Disease Monitoring: Blood Sugar ranges- all below 170 despite off metformin  Polyuria/phagia/dipsia- no      Visual problems- no  Medications: Compliance-not diabetes medications currently Hypoglycemic symptoms- no  Review of Symptoms - see HPI  PMH - Smoking status noted.       Review of Systems     Objective:   Physical Exam Heart - Regular rate and rhythm.  No murmurs, gallops or rubs.    Lungs:  Normal respiratory effort, chest expands symmetrically. Lungs are clear to auscultation, no crackles or wheezes. Extrem - 1-2 + edema at ankles no skin breakdown       Assessment & Plan:

## 2012-05-31 NOTE — Patient Instructions (Addendum)
Keep checking your blood sugar if regularly > 200 then call me  Check your weight if not less than 220 in 1-2 weeks then call me  Increase the lasix to 2 tabs every am  I will call you if your tests are not good.  Otherwise I will send you a letter.  If you do not hear from me with in 2 weeks please call our office.     Come back in 2 months the first of September

## 2012-05-31 NOTE — Assessment & Plan Note (Signed)
Blood sugar seem under control despite stopping metformin.  Will continue to monitor

## 2012-05-31 NOTE — Assessment & Plan Note (Signed)
Continue to have her work with our [facility]

## 2012-06-02 ENCOUNTER — Telehealth: Payer: Self-pay | Admitting: Family Medicine

## 2012-06-02 DIAGNOSIS — I10 Essential (primary) hypertension: Secondary | ICD-10-CM

## 2012-06-02 MED ORDER — SPIRONOLACTONE 25 MG PO TABS: 12.5000 mg | ORAL_TABLET | Freq: Every day | ORAL | Status: DC

## 2012-06-02 NOTE — Telephone Encounter (Signed)
Called.  She is urinating well with increased dose of lasix.  Will stat low dose spironolactone given that her K is currently normal and should decrease with more lasix. Advised Bmet the middle of July to follow K She agrees

## 2012-06-23 ENCOUNTER — Telehealth: Payer: Self-pay | Admitting: Home Health Services

## 2012-06-23 NOTE — Telephone Encounter (Signed)
Spoke with [employer]  Pt reports taking medications as prescribed.  Pt reports not currently monitoring bp at home.  Pt reports pain in right hip which she is trying to manage with OTC medications.  Pt's blood sugars range from 135-170.   Pt reports continued portion control with diet.  Pt reports not moving as much but plans to start exercising again when pain subsides.  Pt reported weighing her self at 219 today.  Reviewed pt has lost 25 lbs since 06/30/11.  Pt set goal this week to continue with dietary changes and to try and more more.  Pt's overall goal is htn management, diet/exercise

## 2012-06-28 ENCOUNTER — Telehealth: Payer: Self-pay | Admitting: *Deleted

## 2012-06-28 MED ORDER — METOPROLOL TARTRATE 25 MG PO TABS: 25.0000 mg | ORAL_TABLET | Freq: Two times a day (BID) | ORAL | Status: DC

## 2012-06-28 NOTE — Telephone Encounter (Signed)
Received fax from pharmacy requesting refills.  Was refilled on 05/03/2012 but for only #30 and the patient takes it twice a day, so the Rx only lasted her for 15 days.  Refill done for #60 with three refills.  [name]

## 2012-07-07 ENCOUNTER — Telehealth: Payer: Self-pay | Admitting: Home Health Services

## 2012-07-07 NOTE — Telephone Encounter (Signed)
Spoke with [employer]  Pt reports feeling good.  Reports fasting blood sugars to be averaging 170.  Pt reports not feeling dizzy.  Pt reported starting back with Wednesday's group exercise this week and did some walking/exercises around the house this week.  Pt reports continuing to cut back on portions.  Pt weight her self at home at 215 lbs.  Discussed/celebrated weight loss over more than 20 lbs since 06/30/11.    Pt set goal to continue with lifestyle changes for this next week including exercise and diet. Schedule fu visit with PCP for 9/13.  Pt's overall goal is bp, dm management and weight loss.

## 2012-07-20 ENCOUNTER — Telehealth: Payer: Self-pay | Admitting: Home Health Services

## 2012-07-20 NOTE — Telephone Encounter (Signed)
Spoke with [employer]  Pt reports having headache for 2-3 days.  Reports taking medications as prescribed.  Reports blood sugars up a little but have come back down.   Pt reports doing some exercises this past week. Pt was vague in conversation.  Pt would not set goals for this next week because she wants to get over her headache.  Pt's overall goal is weight loss, dm management.

## 2012-07-27 ENCOUNTER — Other Ambulatory Visit: Payer: Self-pay | Admitting: Family Medicine

## 2012-07-27 DIAGNOSIS — I10 Essential (primary) hypertension: Secondary | ICD-10-CM

## 2012-07-28 ENCOUNTER — Other Ambulatory Visit: Payer: Self-pay | Admitting: *Deleted

## 2012-07-28 ENCOUNTER — Other Ambulatory Visit: Payer: Medicare Other

## 2012-07-28 ENCOUNTER — Other Ambulatory Visit: Payer: Self-pay | Admitting: Family Medicine

## 2012-07-28 MED ORDER — BENAZEPRIL HCL 10 MG PO TABS: 20.0000 mg | ORAL_TABLET | Freq: Every day | ORAL | Status: DC

## 2012-07-28 MED ORDER — DESIPRAMINE HCL 100 MG PO TABS: 50.0000 mg | ORAL_TABLET | Freq: Every day | ORAL | Status: DC

## 2012-07-28 NOTE — Telephone Encounter (Signed)
Spoke with [employer]  Pt reports taking medications as prescribed.  Fasting blood sugar ranges 140-170.  Pt reports still having a headache. Sometimes it eases up other times it is stronger.  Pt reports doing some at home exercises and reports continuing to monitor portion sizes.  Pt's overall goal is weight loss, dm management, htn management.

## 2012-08-01 ENCOUNTER — Other Ambulatory Visit: Payer: Medicare Other

## 2012-08-01 DIAGNOSIS — I10 Essential (primary) hypertension: Secondary | ICD-10-CM

## 2012-08-01 LAB — BASIC METABOLIC PANEL
CO2: 30 mEq/L (ref 19–32)
Calcium: 9.5 mg/dL (ref 8.4–10.5)
Creat: 0.94 mg/dL (ref 0.50–1.10)
Sodium: 139 mEq/L (ref 135–145)

## 2012-08-01 NOTE — Progress Notes (Signed)
BMP DONE TODAY Chayse Zatarain 

## 2012-08-03 ENCOUNTER — Encounter: Payer: Self-pay | Admitting: Family Medicine

## 2012-08-04 ENCOUNTER — Telehealth: Payer: Self-pay | Admitting: Home Health Services

## 2012-08-04 NOTE — Telephone Encounter (Signed)
Spoke with [employer]  Pt reports taking medications daily as prescribed. Blood sugar range 141-165. Pt not monitoring bp at home.  Pt reports headaches are gone.   Pt reports not having any regular exercise this week.  Diet: reports doing well, eating less skipped some meals because she was busy.    Pt reports feeling pretty good.   Pt's set goals: more exercises, (at church group, walking new puppy at home)  Diet: keep up with dietary changes, more vegetables, less starch.   Pt reports weight at home 221 lbs.   Pt's overall goal is weight loss, dm/bp management.

## 2012-08-15 ENCOUNTER — Telehealth: Payer: Self-pay | Admitting: Family Medicine

## 2012-08-15 NOTE — Telephone Encounter (Signed)
Joni Reining with [employer] regarding form faxed to provider on Ms Rynders.  Was sent back in August, but haven't received yet from provider.  Please complete and fax back.  If needed call Joni Reining at [phone] ext [phone] for another form.

## 2012-08-15 NOTE — Telephone Encounter (Signed)
Do you recall seeing this?

## 2012-08-16 ENCOUNTER — Telehealth: Payer: Self-pay | Admitting: Family Medicine

## 2012-08-16 ENCOUNTER — Other Ambulatory Visit: Payer: Self-pay | Admitting: Family Medicine

## 2012-08-16 ENCOUNTER — Ambulatory Visit: Payer: Medicare Other | Admitting: Family Medicine

## 2012-08-16 NOTE — Telephone Encounter (Signed)
Recommending vein laser treatement on R but wanted ot know if she is medically stable  Responded she needs an appointment to assess diabetes and chf control  He will notify her

## 2012-08-17 NOTE — Telephone Encounter (Signed)
I do not have it in my box and do not recall.   She has an appointment with me soon - I can discuss then if it can wait  Thanks  LC

## 2012-08-23 ENCOUNTER — Encounter: Payer: Self-pay | Admitting: Family Medicine

## 2012-08-23 ENCOUNTER — Ambulatory Visit (INDEPENDENT_AMBULATORY_CARE_PROVIDER_SITE_OTHER): Payer: Medicare Other | Admitting: Family Medicine

## 2012-08-23 VITALS — BP 128/85 | HR 67 | Temp 98.5°F | Ht 62.0 in | Wt 217.0 lb

## 2012-08-23 DIAGNOSIS — Z23 Encounter for immunization: Secondary | ICD-10-CM

## 2012-08-23 DIAGNOSIS — I10 Essential (primary) hypertension: Secondary | ICD-10-CM

## 2012-08-23 DIAGNOSIS — I428 Other cardiomyopathies: Secondary | ICD-10-CM

## 2012-08-23 DIAGNOSIS — E119 Type 2 diabetes mellitus without complications: Secondary | ICD-10-CM

## 2012-08-23 DIAGNOSIS — F3289 Other specified depressive episodes: Secondary | ICD-10-CM

## 2012-08-23 DIAGNOSIS — F329 Major depressive disorder, single episode, unspecified: Secondary | ICD-10-CM

## 2012-08-23 LAB — BASIC METABOLIC PANEL
Calcium: 9.5 mg/dL (ref 8.4–10.5)
Chloride: 100 mEq/L (ref 96–112)
Creat: 0.91 mg/dL (ref 0.50–1.10)
Sodium: 140 mEq/L (ref 135–145)

## 2012-08-23 LAB — POCT GLYCOSYLATED HEMOGLOBIN (HGB A1C): Hemoglobin A1C: 7.5

## 2012-08-23 MED ORDER — BENAZEPRIL HCL 20 MG PO TABS: 20.0000 mg | ORAL_TABLET | Freq: Every day | ORAL | Status: DC

## 2012-08-23 NOTE — Assessment & Plan Note (Signed)
Improving exercise tolerance and good weight control.   Will likely repeat echo at next visit.  Check labs today

## 2012-08-23 NOTE — Progress Notes (Signed)
  Subjective:    Patient ID: [name], female    DOB: August 02, 1951, 61 y.o.   MRN: [id number]  HPI  CHF Has lost 7 lbs.  Feels like has less fluid.  Mild shortness of breath with exertion only.  No chest pain with exertino.  No edema  No palpitations.  Following her weights daily  DIABETES Disease Monitoring: Blood Sugar ranges- all below 170 despite off metformin  Polyuria/phagia/dipsia- no      Visual problems- no  Hypertension Taking medications without problems.  No lightheadness or edema.   Not checking her blood pressure  Is taking 2 K tabs daily for renal stone prophylaxis Medications: Compliance-not diabetes medications currently Hypoglycemic symptoms- no  Review of Symptoms - see HPI  PMH - Smoking status noted.      Review of Systems     Objective:   Physical Exam  Heart - Regular rate and rhythm.  No murmurs, gallops or rubs.    Lungs:  Normal respiratory effort, chest expands symmetrically. Lungs are clear to auscultation, no crackles or wheezes. Ext - no to trace edema  Diabetic Foot Check -  Appearance - no lesions, ulcers or calluses.  Does have signif deformity Skin - no unusual pallor or redness Monofilament testing -  Right - Great toe, medial, central, lateral ball and posterior foot intact Left - Great toe, medial, central, lateral ball and posterior foot intact     Assessment & Plan:

## 2012-08-23 NOTE — Assessment & Plan Note (Signed)
Reasonable control of medications.  Is losing weight so hopefully will continue to reduce a1c

## 2012-08-23 NOTE — Patient Instructions (Addendum)
Great work on your weight and blood pressure and diabetes !!!!  Monitor your weight daily  I will call you if your tests are not good.  Otherwise I will send you a letter.  If you do not hear from me with in 2 weeks please call our office.    I will likely call you about your potassium  Take 1/2 of your desirpamine tablet  It is ok for you to have vein surgery   Come back in 3 months  Cal if having chest pain or gaining weight or having shortness of breath or lightheadness

## 2012-08-23 NOTE — Assessment & Plan Note (Signed)
Doing well with decreasing desipramine dose due to cardiac concerns with TCA

## 2012-08-23 NOTE — Assessment & Plan Note (Signed)
Good control continue current medications.  Will check K may need to decrease Kcitrate taking for stone prophylaxis now that she is on sprironolactone and acei

## 2012-08-24 ENCOUNTER — Encounter: Payer: Self-pay | Admitting: Family Medicine

## 2012-08-26 ENCOUNTER — Other Ambulatory Visit: Payer: Self-pay | Admitting: Family Medicine

## 2012-08-29 ENCOUNTER — Other Ambulatory Visit: Payer: Self-pay | Admitting: Family Medicine

## 2012-09-05 ENCOUNTER — Encounter: Payer: Self-pay | Admitting: Family Medicine

## 2012-09-05 ENCOUNTER — Telehealth: Payer: Self-pay | Admitting: *Deleted

## 2012-09-05 NOTE — Telephone Encounter (Signed)
Carollee Herter with Dr. Glory Rosebush office calling to inquire about surgical clearance for [name] Dr. Jimmie Molly had sent Dr. Deirdre Priest a letter and the patient was to follow up with Dr. Deirdre Priest on 08/23/2012 for him to determine if she was stable enough to undergo vein treatment. They have not heard anything back from Dr. Deirdre Priest.  Please fax note about surgical clearance to [phone].  Will forward to Dr. Deirdre Priest for review.  Ileana Ladd

## 2012-09-05 NOTE — Telephone Encounter (Signed)
Printed letter and put in to be faxed box

## 2012-09-08 ENCOUNTER — Telehealth: Payer: Self-pay | Admitting: Home Health Services

## 2012-09-08 NOTE — Telephone Encounter (Signed)
Spoke with [employer]  Pt reports taking medications as prescribed without missing any days. Fasting blood sugar range 140-155.  Blood pressure today was 128/83. Pt at home weight today reported at 215.   Pt reports walking a few time this week but has not done a lot of physical exercise. Pt reports continuing to cut back on portions.  Pt reported getting eye exam done but did not request dr to send results to PCP.  Will have to sign a release when she come to next appointment.  Pt set goal to continue with diet/exercise goals.  Increase physical activity.  Pt's overall goal is weight loss, dm/bp management.

## 2012-09-14 ENCOUNTER — Telehealth: Payer: Self-pay | Admitting: Home Health Services

## 2012-09-14 NOTE — Telephone Encounter (Signed)
Spoke with [employer]  Pt reports feeling well.  Pt has not missed medications any days.  Pt reports fasting blood glucose at 155 today.  Pt blood pressure today 113/73.  Pt reported going to urologist today and had a good experience, didn't find kidney stone. Pt reported going to podiatrist yesterday and had toe nails trimmed and foot check- everything okay.  Pt reported walking 2x this past week. Reports continuing with smaller food portions, increased vegetables and water.  Pt has a scale given to her from [facility] that send data to nurse navigator for weekly review.  Pt said weight today was at 213 lbs.   Pt set goal to walk 3x this next week and to continue with portion control.  Pt's overall goal is weight loss, dm/bp management.

## 2012-09-22 ENCOUNTER — Telehealth: Payer: Self-pay | Admitting: Home Health Services

## 2012-09-22 NOTE — Telephone Encounter (Signed)
Spoke with [employer]  Pt reports feeling fine.  Takes all medications as prescribed.  Reports fasing blood glucose today at 109 and yesterday at 131.  Pt not currently monitoring bp at home.  Pt reports NO headaches, dizziness or blurred vision.   Pt reports not having any regular exercise this week because it is cold.  She reports not having heat at the house but has since resolved the issue.  Pt has continued to work on portion control and tries to not eat at 5:30 pm daily.  Pt reports at home weight at 211 lbs.  Pt's goal this next week is to continue with medications and dietary changes.  Pt's overall goal is weight loss, dm/bp management.

## 2012-09-28 ENCOUNTER — Other Ambulatory Visit: Payer: Self-pay | Admitting: Family Medicine

## 2012-09-28 MED ORDER — DESIPRAMINE HCL 50 MG PO TABS: 25.0000 mg | ORAL_TABLET | Freq: Every day | ORAL | Status: DC

## 2012-10-06 ENCOUNTER — Telehealth: Payer: Self-pay | Admitting: Home Health Services

## 2012-10-06 NOTE — Telephone Encounter (Signed)
Spoke with [employer]  Pt reports taking medications as prescribed.  Pt reports fasting blood sugars at 133/, 141, 121.  Pt is not currently monitoring bp at home.  Pt reports last weight in at home at 215 lbs.  Pt communications with nurse from [place] [facility] for [employer].   Pt reports doing well with diet changes, drinks more water, exercises weekly with church and reports talking most afternoons with dog.   Pt set goal to continue exercising and dietary changes.  Pt's overall goal is weight loss, dm/bp management.

## 2012-10-24 ENCOUNTER — Other Ambulatory Visit: Payer: Self-pay | Admitting: Family Medicine

## 2012-10-25 ENCOUNTER — Telehealth: Payer: Self-pay | Admitting: Home Health Services

## 2012-10-25 NOTE — Telephone Encounter (Signed)
Spoke with [employer]  Pt reports taking medications daily without missing any days.  Pt reports fasting bglucose to be 144, 129, 114.  Pt reports blood pressure at 135/70.  Pt has had cold for past week.  Pt self reported weight at 214 lbs.  Pt reports walking when she can but hasn't done much because she felt sick.  Pt set goal to continue with portion control and daily movement after Thanksgiving.   Pt's overall goal is weight loss, dm/bp management.

## 2012-10-30 ENCOUNTER — Other Ambulatory Visit: Payer: Self-pay | Admitting: *Deleted

## 2012-10-30 ENCOUNTER — Other Ambulatory Visit: Payer: Self-pay | Admitting: Family Medicine

## 2012-10-30 MED ORDER — METOPROLOL TARTRATE 25 MG PO TABS: 25.0000 mg | ORAL_TABLET | Freq: Two times a day (BID) | ORAL | Status: DC

## 2012-11-02 ENCOUNTER — Telehealth: Payer: Self-pay | Admitting: Home Health Services

## 2012-11-02 NOTE — Telephone Encounter (Signed)
Spoke with [facility].  Pt taking medications as prescribed.  Pt reports home bp values: 129/70  Pt reports home bs values: 142, 125, 141  Pt reports feeling okay.  Is anxious about vein surgery on legs scheduled for 12/10.    Pt reports exercising 1-2 times past week with church.  Diet has remained same.  Pt set goal to continue with life style changes, focus on getting moving quickly after surgery.  Dr. Nolon Stalls pt walking shortly after surgery for recovery.  Pt's overall goal is weight loss, bp/bs management.

## 2012-12-08 ENCOUNTER — Other Ambulatory Visit: Payer: Self-pay | Admitting: Family Medicine

## 2012-12-08 DIAGNOSIS — Z1231 Encounter for screening mammogram for malignant neoplasm of breast: Secondary | ICD-10-CM

## 2012-12-26 ENCOUNTER — Other Ambulatory Visit: Payer: Self-pay | Admitting: Family Medicine

## 2013-01-01 ENCOUNTER — Other Ambulatory Visit: Payer: Self-pay | Admitting: Family Medicine

## 2013-01-09 ENCOUNTER — Ambulatory Visit
Admission: RE | Admit: 2013-01-09 | Discharge: 2013-01-09 | Disposition: A | Discharge status: Home / Self Care | Payer: PRIVATE HEALTH INSURANCE | Source: Ambulatory Visit | Attending: Family Medicine | Admitting: Family Medicine

## 2013-01-09 DIAGNOSIS — Z1231 Encounter for screening mammogram for malignant neoplasm of breast: Secondary | ICD-10-CM

## 2013-02-26 ENCOUNTER — Encounter: Payer: Self-pay | Admitting: Family Medicine

## 2013-02-26 ENCOUNTER — Ambulatory Visit (INDEPENDENT_AMBULATORY_CARE_PROVIDER_SITE_OTHER): Payer: PRIVATE HEALTH INSURANCE | Admitting: Family Medicine

## 2013-02-26 VITALS — BP 117/78 | HR 57 | Temp 98.1°F | Ht 62.0 in | Wt 215.1 lb

## 2013-02-26 DIAGNOSIS — I509 Heart failure, unspecified: Secondary | ICD-10-CM

## 2013-02-26 DIAGNOSIS — E119 Type 2 diabetes mellitus without complications: Secondary | ICD-10-CM

## 2013-02-26 DIAGNOSIS — I5042 Chronic combined systolic (congestive) and diastolic (congestive) heart failure: Secondary | ICD-10-CM | POA: Insufficient documentation

## 2013-02-26 DIAGNOSIS — I5022 Chronic systolic (congestive) heart failure: Secondary | ICD-10-CM

## 2013-02-26 DIAGNOSIS — F329 Major depressive disorder, single episode, unspecified: Secondary | ICD-10-CM

## 2013-02-26 DIAGNOSIS — I10 Essential (primary) hypertension: Secondary | ICD-10-CM

## 2013-02-26 LAB — POCT GLYCOSYLATED HEMOGLOBIN (HGB A1C): Hemoglobin A1C: 7.9

## 2013-02-26 MED ORDER — METFORMIN HCL 500 MG PO TABS: 250.0000 mg | ORAL_TABLET | Freq: Two times a day (BID) | ORAL | Status: DC

## 2013-02-26 NOTE — Patient Instructions (Addendum)
You are doing great  Start metformin 1/2 tab twice daily  Try not taking the desipramine - you can take as needed  I will call you if your tests are not good.  Otherwise I will send you a letter.  If you do not hear from me with in 2 weeks please call our office.     Come back in 3 months

## 2013-02-27 LAB — COMPREHENSIVE METABOLIC PANEL
BUN: 26 mg/dL — ABNORMAL HIGH (ref 6–23)
CO2: 29 mEq/L (ref 19–32)
Creat: 0.75 mg/dL (ref 0.50–1.10)
Glucose, Bld: 121 mg/dL — ABNORMAL HIGH (ref 70–99)
Total Bilirubin: 0.5 mg/dL (ref 0.3–1.2)
Total Protein: 7.7 g/dL (ref 6.0–8.3)

## 2013-02-27 LAB — IRON: Iron: 51 ug/dL (ref 42–145)

## 2013-02-27 LAB — IBC PANEL: UIBC: 292 ug/dL (ref 125–400)

## 2013-02-27 NOTE — Assessment & Plan Note (Addendum)
Well controlled on acei, spironolactone, lasix and bb blocker.  May want to increase spironolactone depending on her K.   Suspect her ej fraction has improved since a year ago. May want to repeat echo

## 2013-02-27 NOTE — Assessment & Plan Note (Signed)
Good control. Continue current medications.

## 2013-02-27 NOTE — Progress Notes (Signed)
  Subjective:    Patient ID: [name], female    DOB: 02-28-1951, 62 y.o.   MRN: [id number]  HPI  CHF Feels well.  Able to walk without shortness of breath.  No edema or orthopnea.  Taking lasix etc regularly  HYPERTENSION Disease Monitoring: Blood pressure range-not checkng Chest pain, palpitations- no      Dyspnea- no Medications: Compliance- good Lightheadedness,Syncope- no   Edema- no  DIABETES Disease Monitoring: Blood Sugar ranges-not checking Polyuria/phagia/dipsia- no      Visual problems- no Medications: Compliance- no medications  Hypoglycemic symptoms- no  Depression Feels well except for stressors withher husband who she cares for he has dementia Ok with stopping desipramnie gradually   ROS See HPI above   PMH Smoking Status noted  Lab Review   Potassium  Date Value Range Status  02/26/2013 4.1  3.5 - 5.3 mEq/L Final     Sodium  Date Value Range Status  02/26/2013 141  135 - 145 mEq/L Final     Creat  Date Value Range Status  02/26/2013 0.75  0.50 - 1.10 mg/dL Final     Creatinine, Ser  Date Value Range Status  05/03/2012 0.90  0.50 - 1.10 mg/dL Final     Review of Symptoms - see HPI  PMH - Smoking status noted.    CHF  Review of Systems     Objective:   Physical Exam  Alert no acute distress Heart - Regular rate and rhythm.  No murmurs, gallops or rubs.    Lungs:  Normal respiratory effort, chest expands symmetrically. Lungs are clear to auscultation, no crackles or wheezes. Extremities:  No cyanosis, edema, or deformity noted with good range of motion of all major joints.   Psych:  Cognition and judgment appear intact. Alert, communicative  and cooperative with normal attention span and concentration. No apparent delusions, illusions, hallucinations       Assessment & Plan:

## 2013-02-27 NOTE — Assessment & Plan Note (Signed)
Ok control but will restart low dose metformin to try to get to below 7.5

## 2013-02-27 NOTE — Assessment & Plan Note (Signed)
Will wean desipramine given possible cardiac arrythmias

## 2013-03-01 ENCOUNTER — Encounter: Payer: Self-pay | Admitting: Family Medicine

## 2013-03-08 ENCOUNTER — Telehealth: Payer: Self-pay | Admitting: Home Health Services

## 2013-03-08 NOTE — Telephone Encounter (Signed)
Spoke with [name] Pt reports feeling: good Pt reports taking medications yes Patient missed taking medications 0 days this week.   Pt is self monitoring their DM:yes medication compliance: compliant all of the time, home glucose monitoring: is performed regularly, fasting values range 193, 174, 153, 140  Pt is self monitoring their HTN:yes usual 135/72/125/68    Last weeks goals:Start taking metformin, increase physical activity (pt walks dog 2x daily, does group exercise with church 1x week) Pt was successful with last week's goals: yes This weeks goals: Walk dog 2x daily, church exercise group Pt's overall goal is: Dm management, weight loss

## 2013-03-15 HISTORY — PX: VASCULAR SURGERY: SHX849

## 2013-03-22 ENCOUNTER — Telehealth: Payer: Self-pay | Admitting: Home Health Services

## 2013-03-22 NOTE — Telephone Encounter (Signed)
Spoke with [place] Pt reports feeling: fair Pt reports taking medications yes Patient missed taking medications 0 days this week.   Pt is self monitoring their DM:yes medication compliance: compliant all of the time, diabetic diet compliance: noncompliant some of the time, home glucose monitoring: is performed regularly, fasting values range 197  Last weeks goals:start metformin (taking medications), exercise with dog Pt was successful with last week's goals: no exercise, pt reported gaining 5 lbs This weeks goals: Pt will exercise 2x this next week.  Pt's overall goal is: dm management, weight loss

## 2013-03-28 ENCOUNTER — Emergency Department (INDEPENDENT_AMBULATORY_CARE_PROVIDER_SITE_OTHER)
Admission: EM | Admit: 2013-03-28 | Discharge: 2013-03-28 | Disposition: A | Discharge status: Nursing Home (Includes ICF) | Payer: PRIVATE HEALTH INSURANCE | Source: Home / Self Care | Attending: Emergency Medicine | Admitting: Emergency Medicine

## 2013-03-28 ENCOUNTER — Encounter (HOSPITAL_COMMUNITY): Payer: Self-pay | Admitting: *Deleted

## 2013-03-28 ENCOUNTER — Emergency Department (HOSPITAL_COMMUNITY)
Admission: EM | Admit: 2013-03-28 | Discharge: 2013-03-29 | Disposition: A | Discharge status: Home / Self Care | Payer: PRIVATE HEALTH INSURANCE | Attending: Emergency Medicine | Admitting: Emergency Medicine

## 2013-03-28 ENCOUNTER — Emergency Department (HOSPITAL_COMMUNITY): Payer: PRIVATE HEALTH INSURANCE

## 2013-03-28 ENCOUNTER — Telehealth: Payer: Self-pay | Admitting: Family Medicine

## 2013-03-28 DIAGNOSIS — F329 Major depressive disorder, single episode, unspecified: Secondary | ICD-10-CM | POA: Insufficient documentation

## 2013-03-28 DIAGNOSIS — J45909 Unspecified asthma, uncomplicated: Secondary | ICD-10-CM | POA: Insufficient documentation

## 2013-03-28 DIAGNOSIS — Z862 Personal history of diseases of the blood and blood-forming organs and certain disorders involving the immune mechanism: Secondary | ICD-10-CM | POA: Insufficient documentation

## 2013-03-28 DIAGNOSIS — I10 Essential (primary) hypertension: Secondary | ICD-10-CM | POA: Insufficient documentation

## 2013-03-28 DIAGNOSIS — R51 Headache: Secondary | ICD-10-CM | POA: Insufficient documentation

## 2013-03-28 DIAGNOSIS — Z79899 Other long term (current) drug therapy: Secondary | ICD-10-CM | POA: Insufficient documentation

## 2013-03-28 DIAGNOSIS — Z872 Personal history of diseases of the skin and subcutaneous tissue: Secondary | ICD-10-CM | POA: Insufficient documentation

## 2013-03-28 DIAGNOSIS — Z8669 Personal history of other diseases of the nervous system and sense organs: Secondary | ICD-10-CM | POA: Insufficient documentation

## 2013-03-28 DIAGNOSIS — E669 Obesity, unspecified: Secondary | ICD-10-CM | POA: Insufficient documentation

## 2013-03-28 DIAGNOSIS — Z7982 Long term (current) use of aspirin: Secondary | ICD-10-CM | POA: Insufficient documentation

## 2013-03-28 DIAGNOSIS — F3289 Other specified depressive episodes: Secondary | ICD-10-CM | POA: Insufficient documentation

## 2013-03-28 DIAGNOSIS — Z8739 Personal history of other diseases of the musculoskeletal system and connective tissue: Secondary | ICD-10-CM | POA: Insufficient documentation

## 2013-03-28 DIAGNOSIS — E119 Type 2 diabetes mellitus without complications: Secondary | ICD-10-CM | POA: Insufficient documentation

## 2013-03-28 DIAGNOSIS — I509 Heart failure, unspecified: Secondary | ICD-10-CM | POA: Insufficient documentation

## 2013-03-28 DIAGNOSIS — R635 Abnormal weight gain: Secondary | ICD-10-CM

## 2013-03-28 DIAGNOSIS — Z8719 Personal history of other diseases of the digestive system: Secondary | ICD-10-CM | POA: Insufficient documentation

## 2013-03-28 DIAGNOSIS — Z87442 Personal history of urinary calculi: Secondary | ICD-10-CM | POA: Insufficient documentation

## 2013-03-28 LAB — CBC
MCV: 88.3 fL (ref 78.0–100.0)
Platelets: 223 10*3/uL (ref 150–400)
RDW: 13.7 % (ref 11.5–15.5)
WBC: 10.2 10*3/uL (ref 4.0–10.5)

## 2013-03-28 LAB — URINALYSIS, ROUTINE W REFLEX MICROSCOPIC
Glucose, UA: NEGATIVE mg/dL
Hgb urine dipstick: NEGATIVE
Specific Gravity, Urine: 1.017 (ref 1.005–1.030)

## 2013-03-28 LAB — COMPREHENSIVE METABOLIC PANEL
ALT: 8 U/L (ref 0–35)
AST: 19 U/L (ref 0–37)
CO2: 29 mEq/L (ref 19–32)
Calcium: 9.8 mg/dL (ref 8.4–10.5)
GFR calc non Af Amer: 74 mL/min — ABNORMAL LOW (ref >90)
Sodium: 140 mEq/L (ref 135–145)
Total Protein: 8.1 g/dL (ref 6.0–8.3)

## 2013-03-28 LAB — DIFFERENTIAL
Basophils Absolute: 0 10*3/uL (ref 0.0–0.1)
Eosinophils Relative: 1 % (ref 0–5)
Lymphocytes Relative: 30 % (ref 12–46)

## 2013-03-28 LAB — GLUCOSE, CAPILLARY: Glucose-Capillary: 153 mg/dL — ABNORMAL HIGH (ref 70–99)

## 2013-03-28 LAB — POCT I-STAT TROPONIN I: Troponin i, poc: 0.01 ng/mL (ref 0.00–0.08)

## 2013-03-28 MED ORDER — ONDANSETRON 4 MG PO TBDP
4.0000 mg | ORAL_TABLET | Freq: Once | ORAL | Status: AC
  Administered 2013-03-28: 4 mg via ORAL

## 2013-03-28 MED ORDER — HYDROCODONE-ACETAMINOPHEN 5-325 MG PO TABS
1.0000 | ORAL_TABLET | Freq: Once | ORAL | Status: AC
  Administered 2013-03-28: 1 via ORAL

## 2013-03-28 MED ORDER — PROCHLORPERAZINE EDISYLATE 5 MG/ML IJ SOLN
10.0000 mg | Freq: Once | INTRAMUSCULAR | Status: AC
  Administered 2013-03-28: 10 mg via INTRAVENOUS
  Filled 2013-03-28: qty 2

## 2013-03-28 MED ORDER — HYDROCODONE-ACETAMINOPHEN 5-325 MG PO TABS
ORAL_TABLET | ORAL | Status: AC
  Filled 2013-03-28: qty 1

## 2013-03-28 MED ORDER — ONDANSETRON 4 MG PO TBDP
ORAL_TABLET | ORAL | Status: AC
  Filled 2013-03-28: qty 1

## 2013-03-28 MED ORDER — DIPHENHYDRAMINE HCL 50 MG/ML IJ SOLN
25.0000 mg | Freq: Once | INTRAMUSCULAR | Status: AC
  Administered 2013-03-28: 25 mg via INTRAVENOUS
  Filled 2013-03-28: qty 1

## 2013-03-28 MED ORDER — SODIUM CHLORIDE 0.9 % IV BOLUS (SEPSIS)
500.0000 mL | Freq: Once | INTRAVENOUS | Status: AC
  Administered 2013-03-28: 500 mL via INTRAVENOUS

## 2013-03-28 NOTE — ED Notes (Signed)
CBG checked

## 2013-03-28 NOTE — Telephone Encounter (Signed)
Returned call to patient.  C/o headache and took ibuprofen.  Headache went away and returned.  Still c/o dizziness and nausea.  No blurred vision, tingling, and numbness.  CBG this morning--175.  Has been able to eat and keep food down.  No appts available this afternoon.  Will have patient go to urgent care for evaluation.  Patient will see if her son is able to take her to urgent care.  Will call our office back as needed.  Gaylene Brooks, RN

## 2013-03-28 NOTE — ED Notes (Signed)
Lab results reviewed

## 2013-03-28 NOTE — ED Provider Notes (Signed)
History     CSN: [id number]  Arrival date & time 03/28/13  1600   First MD Initiated Contact with Patient 03/28/13 1728      Chief Complaint  Patient presents with  . Headache    (Consider location/radiation/quality/duration/timing/severity/associated sxs/prior treatment) HPI Comments: Pt reports worst headache ever that did not improve with ibuprofen at home today. Similar to prior headaches but much more severe than usual.  Assoc with nausea and feeling "weak". Denies SOB, chest pain, but does have hx chf and admits to 9 pound weight gain in last week.   Patient is a 62 y.o. female presenting with headaches. The history is provided by the patient.  Headache Pain location:  L temporal and R temporal Radiates to:  Does not radiate Severity currently:  8/10 Onset quality:  Sudden Timing:  Constant Progression:  Worsening Chronicity:  New Similar to prior headaches: yes   Relieved by:  Nothing Worsened by:  Nothing tried Ineffective treatments:  NSAIDs Associated symptoms: dizziness, nausea and weakness   Associated symptoms: no blurred vision, no cough, no fever, no syncope, no visual change and no vomiting     Past Medical History  Diagnosis Date  . Hypertension   . Diabetes mellitus   . Depression   . Asthma   . Eczema   . Arthritis   . Cataract   . GERD (gastroesophageal reflux disease)   . History of kidney stones     w/ hx of hydronephrosis - followed by [facility]  . Anemia   . Obesity   . HIV nonspecific serology     2006: indeterminate HIV blood test, seen by ID, felt secondary to cross reacting antibodies with no further workup felt necessary at that time   . Fatty liver     Fatty infiltration of liver noted on 03/2012 CT scan  . CHF, acute 05/03/2012    Past Surgical History  Procedure Laterality Date  . Cystoscopy w/ litholapaxy / ehl    . Cholecystectomy  2003  . Replacement total knee bilateral  2005 &2006  . Joint replacement      bilateral  knee replacement  . Vascular surgery Right 03/15/2013    Ultrasound guided sclerotherapy    Family History  Problem Relation Age of Onset  . Diabetes Mother   . Stroke Mother   . Heart disease Father   . Anemia Father     History  Substance Use Topics  . Smoking status: Never Smoker   . Smokeless tobacco: Never Used  . Alcohol Use: No    OB History   Grav Para Term Preterm Abortions TAB SAB Ect Mult Living                  Review of Systems  Constitutional: Negative for fever, chills and diaphoresis.  Eyes: Negative for blurred vision.  Respiratory: Negative for cough and shortness of breath.   Cardiovascular: Negative for chest pain, palpitations and syncope.  Gastrointestinal: Positive for nausea. Negative for vomiting.  Neurological: Positive for dizziness, weakness and headaches.    Allergies  Review of patient's allergies indicates no known allergies.  Home Medications   Current Outpatient Rx  Name  Route  Sig  Dispense  Refill  . aspirin 81 MG tablet   Oral   Take 81 mg by mouth daily.         . benazepril (LOTENSIN) 20 MG tablet   Oral   Take 1 tablet (20 mg total) by mouth daily.  90 tablet   3   . desipramine (NORPRAMIN) 50 MG tablet      TAKE 1/2 TABLET BY MOUTH AT BEDTIME   15 tablet   1   . Easy Touch Lancets MISC      As directed   100 each   11   . fish oil-omega-3 fatty acids 1000 MG capsule   Oral   Take 1 g by mouth 2 (two) times daily.         Marland Kitchen FLOVENT HFA 110 MCG/ACT inhaler      inhale 2 puffs by mouth twice a day   1 Inhaler   6   . furosemide (LASIX) 40 MG tablet      take 2 tablets by mouth daily   60 tablet   6   . gabapentin (NEURONTIN) 300 MG capsule   Oral   Take 300 mg by mouth 3 (three) times daily. Dr Ralene Cork          . metFORMIN (GLUCOPHAGE) 500 MG tablet   Oral   Take 0.5 tablets (250 mg total) by mouth 2 (two) times daily with a meal.   90 tablet   1   . metoprolol tartrate (LOPRESSOR) 25  MG tablet   Oral   Take 1 tablet (25 mg total) by mouth 2 (two) times daily.   60 tablet   6   . ONE TOUCH ULTRA TEST test strip      TEST BLOOD GLUCOSE ONCE DAILY   100 each   3   . potassium citrate (UROCIT-K 10) 10 MEQ (1080 MG) SR tablet   Oral   Take 10 mEq by mouth daily.          Marland Kitchen PROAIR HFA 108 (90 BASE) MCG/ACT inhaler               . ranitidine (ZANTAC) 150 MG tablet      take 1 tablet by mouth at bedtime   30 tablet   11   . spironolactone (ALDACTONE) 25 MG tablet      TAKE 1/2 TABLET BY MOUTH ONCE DAILY   15 tablet   1     Please ask patient to make an appointment Thanks     BP 135/81  Pulse 53  Temp(Src) 97.6 F (36.4 C) (Oral)  Resp 16  SpO2 100%  Physical Exam  Constitutional: She is oriented to person, place, and time. She appears well-developed and well-nourished. She is cooperative. She appears ill.  Eyes: Pupils are equal, round, and reactive to light.  Neck: Neck supple.  Cardiovascular: Normal rate and regular rhythm.   No pitting edema BLE  Pulmonary/Chest: Effort normal and breath sounds normal.  Neurological: She is alert and oriented to person, place, and time.  Facial symmetry, grips/strength equal bilaterally    ED Course  Procedures (including critical care time)  Labs Reviewed  GLUCOSE, CAPILLARY - Abnormal; Notable for the following:    Glucose-Capillary 153 (*)    All other components within normal limits   No results found.   1. Headache   2. Weight gain       MDM  Pt with sudden, severe headache and nausea, feeling "weak". Also hx chf and weight gain in last week. EKG sinus bradycardia 54bpm.  Transfer to ER by shuttle for further eval. Pt given hydrocodone x1 and zofran 4mg  for sx.         Cathlyn Parsons, NP 03/28/13 1737  Cathlyn Parsons, NP  03/28/13 1745 

## 2013-03-28 NOTE — ED Notes (Signed)
[facility]. Having severe headache and nausea since this am, no relief with ibuprofen. No vomiting or diarrhea. Reports generalized fatigue, weakness and was unsteady on her feet at [facility]. Reports 9 lb weight gain in past week, had ekg done at [facility], given 1 hydrocodone and zofran. Denies sob or cp.

## 2013-03-28 NOTE — Telephone Encounter (Signed)
Please call patient back asap,  Reported having elevated bp of 137/103 at 12:15 this afternoon.  Patient feeling dizzy with nauea.

## 2013-03-28 NOTE — ED Provider Notes (Signed)
History     CSN: [id number]  Arrival date & time 03/28/13  1755   First MD Initiated Contact with Patient 03/28/13 2049      Chief Complaint  Patient presents with  . Headache    (Consider location/radiation/quality/duration/timing/severity/associated sxs/prior treatment) Patient is a 62 y.o. female presenting with headaches. The history is provided by the patient.  Headache Pain location:  Frontal Quality:  Dull Radiates to:  Does not radiate Severity currently:  6/10 Severity at highest:  9/10 Onset quality:  Gradual (noticed upon awakening this morning) Timing:  Constant Chronicity:  Recurrent (similar to prior HAs) Relieved by: improved after Norco from Urgent Care.   Past Medical History  Diagnosis Date  . Hypertension   . Diabetes mellitus   . Depression   . Asthma   . Eczema   . Arthritis   . Cataract   . GERD (gastroesophageal reflux disease)   . History of kidney stones     w/ hx of hydronephrosis - followed by [facility]  . Anemia   . Obesity   . HIV nonspecific serology     2006: indeterminate HIV blood test, seen by ID, felt secondary to cross reacting antibodies with no further workup felt necessary at that time   . Fatty liver     Fatty infiltration of liver noted on 03/2012 CT scan  . CHF, acute 05/03/2012    Past Surgical History  Procedure Laterality Date  . Cystoscopy w/ litholapaxy / ehl    . Cholecystectomy  2003  . Replacement total knee bilateral  2005 &2006  . Joint replacement      bilateral knee replacement  . Vascular surgery Right 03/15/2013    Ultrasound guided sclerotherapy    Family History  Problem Relation Age of Onset  . Diabetes Mother   . Stroke Mother   . Heart disease Father   . Anemia Father     History  Substance Use Topics  . Smoking status: Never Smoker   . Smokeless tobacco: Never Used  . Alcohol Use: No    OB History   Grav Para Term Preterm Abortions TAB SAB Ect Mult Living                   Review of Systems  Neurological: Positive for headaches.  All other systems reviewed and are negative.    Allergies  Review of patient's allergies indicates no known allergies.  Home Medications   Current Outpatient Rx  Name  Route  Sig  Dispense  Refill  . acetaminophen (TYLENOL) 500 MG tablet   Oral   Take 1,000 mg by mouth every 6 (six) hours as needed for pain.         Marland Kitchen aspirin 81 MG tablet   Oral   Take 81 mg by mouth daily.         . benazepril (LOTENSIN) 20 MG tablet   Oral   Take 1 tablet (20 mg total) by mouth daily.   90 tablet   3   . desipramine (NORPRAMIN) 50 MG tablet   Oral   Take 25 mg by mouth at bedtime.         . fish oil-omega-3 fatty acids 1000 MG capsule   Oral   Take 1 g by mouth 2 (two) times daily.         Marland Kitchen FLOVENT HFA 110 MCG/ACT inhaler      inhale 2 puffs by mouth twice a day   1  Inhaler   6   . furosemide (LASIX) 40 MG tablet   Oral   Take 80 mg by mouth daily.         Marland Kitchen gabapentin (NEURONTIN) 300 MG capsule   Oral   Take 300 mg by mouth 3 (three) times daily. Dr Ralene Cork          . hydrocortisone cream 1 %   Topical   Apply 1 application topically daily.         Marland Kitchen ibuprofen (ADVIL,MOTRIN) 200 MG tablet   Oral   Take 400 mg by mouth every 6 (six) hours as needed for pain.         Marland Kitchen Liniments (SALONPAS) PADS   Apply externally   Apply 1 each topically daily as needed (for muscle pain).         . metFORMIN (GLUCOPHAGE) 500 MG tablet   Oral   Take 0.5 tablets (250 mg total) by mouth 2 (two) times daily with a meal.   90 tablet   1   . metoprolol tartrate (LOPRESSOR) 25 MG tablet   Oral   Take 1 tablet (25 mg total) by mouth 2 (two) times daily.   60 tablet   6   . potassium citrate (UROCIT-K 10) 10 MEQ (1080 MG) SR tablet   Oral   Take 10 mEq by mouth 2 (two) times daily.          Marland Kitchen PROAIR HFA 108 (90 BASE) MCG/ACT inhaler   Inhalation   Inhale 2 puffs into the lungs every 4 (four) hours  as needed.          . ranitidine (ZANTAC) 150 MG tablet   Oral   Take 150 mg by mouth at bedtime.         Marland Kitchen spironolactone (ALDACTONE) 25 MG tablet   Oral   Take 12.5 mg by mouth daily.         Marland Kitchen trolamine salicylate (ASPERCREME) 10 % cream   Topical   Apply 1 application topically daily. For shoulder and hip pain           BP 144/69  Pulse 52  Temp(Src) 97.6 F (36.4 C) (Oral)  Resp 11  SpO2 98%  Physical Exam  Vitals reviewed. Constitutional: She is oriented to person, place, and time. She appears well-developed and well-nourished.  obese  HENT:  Right Ear: External ear normal.  Left Ear: External ear normal.  Mouth/Throat: No oropharyngeal exudate.  Eyes: Conjunctivae and EOM are normal. Pupils are equal, round, and reactive to light.  Neck: Normal range of motion. Neck supple.  Cardiovascular: Normal rate, regular rhythm, normal heart sounds and intact distal pulses.  Exam reveals no gallop and no friction rub.   No murmur heard. Pulmonary/Chest: Effort normal and breath sounds normal.  Abdominal: Soft. Bowel sounds are normal. She exhibits no distension. There is no tenderness.  Musculoskeletal: Normal range of motion. She exhibits no edema.  Neurological: She is alert and oriented to person, place, and time. She has normal strength. No cranial nerve deficit or sensory deficit. Coordination and gait normal.  Finger to nose, Heel to Shin intact Normal Gait No drift   Skin: Skin is warm and dry. No rash noted.  Psychiatric: She has a normal mood and affect.    ED Course  Procedures (including critical care time)  Labs Reviewed  CBC - Abnormal; Notable for the following:    Hemoglobin 11.3 (*)    HCT 34.8 (*)  All other components within normal limits  COMPREHENSIVE METABOLIC PANEL - Abnormal; Notable for the following:    Glucose, Bld 168 (*)    BUN 24 (*)    GFR calc non Af Amer 74 (*)    GFR calc Af Amer 86 (*)    All other components within  normal limits  DIFFERENTIAL  TROPONIN I  URINALYSIS, ROUTINE W REFLEX MICROSCOPIC  POCT I-STAT TROPONIN I   Ct Head Wo Contrast  03/28/2013  *RADIOLOGY REPORT*  Clinical Data: Headache and nausea today.  CT HEAD WITHOUT CONTRAST  Technique:  Contiguous axial images were obtained from the base of the skull through the vertex without contrast.  Comparison: None.  Findings: The ventricles and sulci are symmetrical without significant effacement, displacement, or dilatation. No mass effect or midline shift. No abnormal extra-axial fluid collections. The grey-white matter junction is distinct. Basal cisterns are not effaced. No acute intracranial hemorrhage. No depressed skull fractures.  Visualized paranasal sinuses and mastoid air cells are not opacified.  IMPRESSION: No acute intracranial abnormalities.   Original Report Authenticated By: Burman Nieves, M.D.     Date: 03/28/2013  Rate: 54  Rhythm: sinus bradycardia  QRS Axis: normal  Intervals: normal  ST/T Wave abnormalities: normal  Conduction Disutrbances: none  Narrative Interpretation:   Old EKG Reviewed: No significant changes noted     1. Headache       MDM   68 y F with PMH of T2DM, HTN, GERD and CHF here for HA that she noticed upon awakening today.  Frontal, non-radiating, 6/10 upon awakening, 9/10 at its worst, 6/10 now.  +slight nausea, but no emesis.  Pt has been able to eat today.  No fevers, vision changes, neck pain/stiffness, CP, SOB, weakness, numbness or other complaints.  AFVSS, well appearing, laughing during exam.  Lungs clear.  Neuro exam WNLs.    Diff Dx: Migraine, Tension HA, Sinus HA, Meningitis, Glacuoma, Temporal Arteritis.  No vision changes and VF intact to confrontation so doubt TA and glaucoma.  Doubt meninigitis as no leukocytosis, afebrile, no meningismus.  Labs from triage including CBC, CMP, trop, UA all unremarkable.  Will CT head and treat with migraine cocktail (NS bolus, compazine,  benadryl)  12:10 AM CT head negative.  Pt feels better.  Return precautions reviewed.  It is felt the pt is stable for d/c with close PCP f/u.  All questions answered and patient expressed understanding.  Disposition: Discharge  Condition: Good  Follow-up Information   Schedule an appointment as soon as possible for a visit with Carney Living, MD.   Contact information:   [address] [place] [address] [phone]       Pt seen in conjunction with my attending, Dr. Manus Gunning.   Oleh Genin, MD PGY-II [facility] Emergency Medicine Resident   Oleh Genin, MD 03/29/13 [phone]

## 2013-03-28 NOTE — ED Notes (Signed)
1620  Called by registration to assess pt., because she was unsteady on her feet at the front desk and almost fell.  Pt. c/o headache and nausea onset this AM.   Took Ibuprofen 400 mg. with some relief.  States he BP was 137/103 at home.  CBG was 175 this AM.

## 2013-03-28 NOTE — ED Provider Notes (Cosign Needed Addendum)
Medical screening examination/treatment/ procedure(s) were performed by non-physician practitioner and as supervising physician I was immediately available for consultations/colaborattion.   [employer] [place],M.D.  Duwayne Heck de Marcello Moores, MD 03/28/13 screening examination/treatment/ procedure(s) were performed by non-physician practitioner and as supervising physician I was immediately available for consultations/colaborattion.   [employer] [place],M.D.  Duwayne Heck de [place], MD 03/29/13 2132  Duwayne Heck de [address] MD 04/09/13 [phone]

## 2013-03-29 NOTE — ED Provider Notes (Signed)
I saw and evaluated the patient, reviewed the resident's note and I agree with the findings and plan. If applicable, I agree with the resident's interpretation of the EKG.  If applicable, I was present for critical portions of any procedures performed.  Gradual onset headache similar to previous.  Denies thunderclap onset.  No fever, weakness, numbness, tingling, CP, SOB. CN 2-12 intact, 5/5 strength throughout, no meningismus. No temporal artery tenderness. No vision change.  Glynn Octave, MD 03/29/13 1144

## 2013-03-29 NOTE — ED Notes (Signed)
Pt denies any questions or pain upon discharge. 

## 2013-03-30 ENCOUNTER — Other Ambulatory Visit: Payer: Self-pay | Admitting: Family Medicine

## 2013-04-11 ENCOUNTER — Encounter: Payer: Self-pay | Admitting: Home Health Services

## 2013-04-12 ENCOUNTER — Telehealth: Payer: Self-pay | Admitting: Home Health Services

## 2013-04-12 NOTE — Telephone Encounter (Signed)
Spoke with [name] Pt reports feeling: fair Pt reports taking medications yes Patient missed taking medications 0 days this week.   Pt is self monitoring their DM:yes medication compliance: compliant all of the time, diabetic diet compliance: probably noncompliant though I cannot elicit that specific history, home glucose monitoring: is performed regularly, fasting values range 150,109,103  Pt is self monitoring their HTN:yes  Self reported values 131/79, 133/75, 129/75, 113/79  Last weeks goals:exercise 2-3 times Pt was successful with last week's goals: yes This weeks goals: exercise 2-3 times Pt's overall goal is: dm/htn management, weight loss

## 2013-04-26 ENCOUNTER — Ambulatory Visit (INDEPENDENT_AMBULATORY_CARE_PROVIDER_SITE_OTHER): Payer: PRIVATE HEALTH INSURANCE | Admitting: Home Health Services

## 2013-04-26 ENCOUNTER — Encounter: Payer: Self-pay | Admitting: Home Health Services

## 2013-04-26 ENCOUNTER — Other Ambulatory Visit: Payer: Self-pay | Admitting: *Deleted

## 2013-04-26 ENCOUNTER — Other Ambulatory Visit: Payer: Self-pay | Admitting: Family Medicine

## 2013-04-26 VITALS — BP 108/77 | HR 58 | Temp 97.3°F | Ht 62.0 in | Wt 227.6 lb

## 2013-04-26 DIAGNOSIS — Z0000 Encounter for general adult medical examination without abnormal findings: Secondary | ICD-10-CM

## 2013-04-26 NOTE — Telephone Encounter (Signed)
Requested Prescriptions   Pending Prescriptions Disp Refills  . spironolactone (ALDACTONE) 25 MG tablet      Sig: Take 0.5 tablets (12.5 mg total) by mouth daily.

## 2013-04-26 NOTE — Progress Notes (Signed)
Patient here for annual wellness visit, patient reports: Risk Factors/Conditions needing evaluation or treatment: Pt does not have any new risk factors that need evaluation. Home Safety: Pt lives with husband and son in 1 story home. 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  Pt reports having smoke detectors. Other Information: Corrective lens: Pt uses corrective lens for reading.  Dentures: Pt does not have dentures. Has bi-annual dental appointments.  Memory: Pt reports some memory problems. Patient's Mini Mental Score (recorded in doc. flowsheet): 28  Balance/Gait: PT reports no falls in the past year.  Reports pain sometimes in legs     Annual Wellness Visit Requirements Recorded Today In  Medical, family, social history Past Medical, Family, Social History Section  Current providers Care team  Current medications Medications  Wt, BP, Ht, BMI Vital signs  Tobacco, alcohol, illicit drug use History  ADL Nurse Assessment  Depression Screening Nurse Assessment  Cognitive impairment Nurse Assessment  Mini Mental Status Document Flowsheet  Fall Risk Fall/Depression  Home Safety Progress Note  End of Life Planning (welcome visit) Social Documentation  Medicare preventative services Progress Note  Risk factors/conditions needing evaluation/treatment Progress Note  Personalized health advice Patient Instructions, goals, letter  Diet & Exercise Social Documentation  Emergency Contact Social Documentation  Seat Belts Social Documentation  Sun exposure/protection Social Documentation    I have reviewed this visit and discussed with Arlys John and agree with her documentation. CHAMBLISS,MARSHALL L

## 2013-04-26 NOTE — Patient Instructions (Signed)
1. Schedule a bone density screening. 2. Start a regular exercise routine. 3. Consider not eating late at night. 4. Continue to work on weight loss by watching portion sizes, eating more fruits and vegetables.

## 2013-04-27 MED ORDER — SPIRONOLACTONE 25 MG PO TABS: 12.5000 mg | ORAL_TABLET | Freq: Every day | ORAL | Status: DC

## 2013-04-30 ENCOUNTER — Other Ambulatory Visit: Payer: Self-pay | Admitting: Family Medicine

## 2013-05-04 ENCOUNTER — Encounter: Payer: Self-pay | Admitting: Home Health Services

## 2013-05-21 ENCOUNTER — Encounter: Payer: Self-pay | Admitting: Family Medicine

## 2013-05-21 ENCOUNTER — Ambulatory Visit (INDEPENDENT_AMBULATORY_CARE_PROVIDER_SITE_OTHER): Payer: PRIVATE HEALTH INSURANCE | Admitting: Family Medicine

## 2013-05-21 VITALS — BP 124/81 | HR 86 | Temp 98.1°F | Wt 223.0 lb

## 2013-05-21 DIAGNOSIS — I509 Heart failure, unspecified: Secondary | ICD-10-CM

## 2013-05-21 DIAGNOSIS — I5022 Chronic systolic (congestive) heart failure: Secondary | ICD-10-CM

## 2013-05-21 DIAGNOSIS — R42 Dizziness and giddiness: Secondary | ICD-10-CM | POA: Insufficient documentation

## 2013-05-21 DIAGNOSIS — E119 Type 2 diabetes mellitus without complications: Secondary | ICD-10-CM

## 2013-05-21 DIAGNOSIS — I10 Essential (primary) hypertension: Secondary | ICD-10-CM

## 2013-05-21 LAB — CBC
Hemoglobin: 10.8 g/dL — ABNORMAL LOW (ref 12.0–15.0)
RBC: 3.85 MIL/uL — ABNORMAL LOW (ref 3.87–5.11)
WBC: 11 10*3/uL — ABNORMAL HIGH (ref 4.0–10.5)

## 2013-05-21 LAB — POCT GLYCOSYLATED HEMOGLOBIN (HGB A1C): Hemoglobin A1C: 6.9

## 2013-05-21 NOTE — Assessment & Plan Note (Addendum)
Under control by blood sugar readings.  Await A1c - Well controlled

## 2013-05-21 NOTE — Patient Instructions (Addendum)
If your dizziness is any worse come back immediately  If your dizziness is not gone in 2 weeks come back  I will call you if your tests are not good.  Otherwise I will send you a letter.  If you do not hear from me with in 2 weeks please call our office.

## 2013-05-21 NOTE — Assessment & Plan Note (Signed)
Stable check labs  

## 2013-05-21 NOTE — Progress Notes (Signed)
  Subjective:    Patient ID: [name], female    DOB: 12/11/1950, 62 y.o.   MRN: [id number]  HPI HYPERTENSION Disease Monitoring: Blood pressure range-108-135/55-84 Chest pain, palpitations- no      Dyspnea- no Medications: Compliance- regular Lightheadedness,Syncope- yes see below   Edema- no  DIABETES Disease Monitoring: Blood Sugar ranges-120-200 Polyuria/phagia/dipsia- no      Visual problems- no Medications: Compliance- daily Hypoglycemic symptoms- no  Dizziness For last week feels dizzy when stands and a little off balance when walks. Gets better the more she moves  Larey Seat once thinks she tripped.  Not using her cane.  No LOC or chest pain or shortness of breath No focal weakness or visual changes.  No vertigo.  No nausea or vomiting or diarrhea.  No hearing changes   ROS See HPI above   PMH Smoking Status noted  Lab Review   Potassium  Date Value Range Status  03/28/2013 4.4  3.5 - 5.1 mEq/L Final     Sodium  Date Value Range Status  03/28/2013 140  135 - 145 mEq/L Final     Creat  Date Value Range Status  02/26/2013 0.75  0.50 - 1.10 mg/dL Final     Creatinine, Ser  Date Value Range Status  03/28/2013 0.82  0.50 - 1.10 mg/dL Final       Review of Symptoms - see HPI  PMH - Smoking status noted.     Review of Systems     Objective:   Physical Exam   Alert no acute distress Heart - Regular rate and rhythm.  No murmurs, gallops or rubs.    Lungs:  Normal respiratory effort, chest expands symmetrically. Lungs are clear to auscultation, no crackles or wheezes. Extremities:  No cyanosis, edema, or deformity noted with good range of motion of all major joints.   Wearing compression stockings Neurologic exam : Cn 2-7 intact Strength equal & normal in upper & lower extremities Romberg normal, finger to nose  Able to stand on own and walk and turn without assistance but wobbles and feels lightheadness  No orthostatic blood pressure changes       Assessment & Plan:

## 2013-05-21 NOTE — Assessment & Plan Note (Signed)
Well controlled by home readings and in office Continue current meds

## 2013-05-21 NOTE — Assessment & Plan Note (Signed)
New but has had in past.   Does not seem to be orthostatic or vertigo.  No focal neuro signs.  Improves after stands.  Will have her be careful with starting ambulation and monitor.  Check labs to rule out anemia or electrolyte abnormality

## 2013-05-22 ENCOUNTER — Encounter: Payer: Self-pay | Admitting: Family Medicine

## 2013-05-22 LAB — COMPREHENSIVE METABOLIC PANEL
Albumin: 3.9 g/dL (ref 3.5–5.2)
Alkaline Phosphatase: 72 U/L (ref 39–117)
CO2: 31 mEq/L (ref 19–32)
Glucose, Bld: 120 mg/dL — ABNORMAL HIGH (ref 70–99)
Potassium: 4.3 mEq/L (ref 3.5–5.3)
Sodium: 140 mEq/L (ref 135–145)
Total Protein: 7.3 g/dL (ref 6.0–8.3)

## 2013-05-24 ENCOUNTER — Telehealth: Payer: Self-pay | Admitting: Home Health Services

## 2013-05-24 NOTE — Telephone Encounter (Signed)
Spoke with [employer].  Pt reports still feeling a little bit dizzy.  Reports no falls this week.  Reports feeling okay, not feeling a lot of depression.  Pt declined to schedule fu visit for dizziness.

## 2013-05-28 ENCOUNTER — Ambulatory Visit: Payer: PRIVATE HEALTH INSURANCE | Admitting: Family Medicine

## 2013-05-30 ENCOUNTER — Other Ambulatory Visit: Payer: Self-pay | Admitting: Family Medicine

## 2013-06-28 ENCOUNTER — Other Ambulatory Visit: Payer: Self-pay | Admitting: Family Medicine

## 2013-07-30 ENCOUNTER — Other Ambulatory Visit: Payer: Self-pay | Admitting: Family Medicine

## 2013-08-27 ENCOUNTER — Encounter: Payer: Self-pay | Admitting: Family Medicine

## 2013-08-27 ENCOUNTER — Ambulatory Visit (INDEPENDENT_AMBULATORY_CARE_PROVIDER_SITE_OTHER): Payer: PRIVATE HEALTH INSURANCE | Admitting: Family Medicine

## 2013-08-27 VITALS — BP 135/70 | HR 63 | Temp 98.1°F | Ht 62.0 in | Wt 226.0 lb

## 2013-08-27 DIAGNOSIS — F3289 Other specified depressive episodes: Secondary | ICD-10-CM

## 2013-08-27 DIAGNOSIS — F329 Major depressive disorder, single episode, unspecified: Secondary | ICD-10-CM

## 2013-08-27 DIAGNOSIS — Z23 Encounter for immunization: Secondary | ICD-10-CM

## 2013-08-27 DIAGNOSIS — J45909 Unspecified asthma, uncomplicated: Secondary | ICD-10-CM

## 2013-08-27 DIAGNOSIS — I509 Heart failure, unspecified: Secondary | ICD-10-CM

## 2013-08-27 DIAGNOSIS — I5022 Chronic systolic (congestive) heart failure: Secondary | ICD-10-CM

## 2013-08-27 DIAGNOSIS — E119 Type 2 diabetes mellitus without complications: Secondary | ICD-10-CM

## 2013-08-27 DIAGNOSIS — I10 Essential (primary) hypertension: Secondary | ICD-10-CM

## 2013-08-27 LAB — BASIC METABOLIC PANEL
BUN: 27 mg/dL — ABNORMAL HIGH (ref 6–23)
CO2: 28 mEq/L (ref 19–32)
Calcium: 9.8 mg/dL (ref 8.4–10.5)
Creat: 1.08 mg/dL (ref 0.50–1.10)

## 2013-08-27 LAB — POCT GLYCOSYLATED HEMOGLOBIN (HGB A1C): Hemoglobin A1C: 6.5

## 2013-08-27 MED ORDER — METFORMIN HCL 500 MG PO TABS: 250.0000 mg | ORAL_TABLET | Freq: Two times a day (BID) | ORAL | Status: DC

## 2013-08-27 MED ORDER — BENAZEPRIL HCL 20 MG PO TABS: 20.0000 mg | ORAL_TABLET | Freq: Every day | ORAL | Status: DC

## 2013-08-27 NOTE — Assessment & Plan Note (Signed)
Seems stable.  On acei, low dose spironolactone and beta blocker.  Has some DOE not sure if related to obesity, asthma, deconditioning or CHF.   Will check echocardiogram for ejection fraction

## 2013-08-27 NOTE — Assessment & Plan Note (Signed)
Stable - recommend using albuterol only as needed

## 2013-08-27 NOTE — Assessment & Plan Note (Signed)
Great control continue low dose metformin

## 2013-08-27 NOTE — Assessment & Plan Note (Signed)
Well controlled by home and office readings.  No longer dizzy.  Continue current medications check K with bmet

## 2013-08-27 NOTE — Patient Instructions (Addendum)
Good to see you  I will call or send you a letter about the echocardiogram and your blood test  I will send in your refills   Try to walk a little further every day  If you get worsening shortness of breath or chest pain then call us  Good luck with your husband

## 2013-08-27 NOTE — Progress Notes (Signed)
  Subjective:    Patient ID: [name], female    DOB: [birth year], 62 y.o.   MRN: [id number]  HPI HYPERTENSION Disease Monitoring: Blood pressure range-brings in readings all well controlled Chest pain, palpitations- no      Dyspnea- sometimes when walking longer distances - stops and resolves - no cp Medications: Compliance- daily Lightheadedness,Syncope- no   Edema- no  DIABETES Disease Monitoring: Blood Sugar ranges-not checknig Polyuria/phagia/dipsia- no      Visual problems- no Medications: Compliance- daily metformin Hypoglycemic symptoms- no  Asthma No wheezing or night cough.  Taking albuterol and flovent at night.  No recent hospitilizations   ROS See HPI above   PMH Smoking Status noted  Lab Review   Potassium  Date Value Range Status  05/21/2013 4.3  3.5 - 5.3 mEq/L Final     Sodium  Date Value Range Status  05/21/2013 140  135 - 145 mEq/L Final     Creat  Date Value Range Status  05/21/2013 1.04  0.50 - 1.10 mg/dL Final     Creatinine, Ser  Date Value Range Status  03/28/2013 0.82  0.50 - 1.10 mg/dL Final      Review of Symptoms - see HPI  PMH - Smoking status noted.      Review of Systems     Objective:   Physical Exam  Alert no acute distress Heart - Regular rate and rhythm.  No murmurs, gallops or rubs.   Distant Lungs:  Normal respiratory effort, chest expands symmetrically. Lungs are clear to auscultation, no crackles or wheezes. Extremities:  No cyanosis, edema (does have nonpitting lipidedema) , or deformity noted with good range of motion of all major joints.    No evident JVD     Assessment & Plan:

## 2013-08-27 NOTE — Assessment & Plan Note (Signed)
Stable off antidepressents.  Her

## 2013-09-04 ENCOUNTER — Ambulatory Visit (HOSPITAL_COMMUNITY)
Admission: RE | Admit: 2013-09-04 | Discharge: 2013-09-04 | Disposition: A | Discharge status: Home / Self Care | Payer: Medicare Other | Source: Ambulatory Visit | Attending: Family Medicine | Admitting: Family Medicine

## 2013-09-04 ENCOUNTER — Encounter: Payer: Self-pay | Admitting: Family Medicine

## 2013-09-04 DIAGNOSIS — I5022 Chronic systolic (congestive) heart failure: Secondary | ICD-10-CM

## 2013-09-04 DIAGNOSIS — I369 Nonrheumatic tricuspid valve disorder, unspecified: Secondary | ICD-10-CM

## 2013-09-04 DIAGNOSIS — I059 Rheumatic mitral valve disease, unspecified: Secondary | ICD-10-CM | POA: Insufficient documentation

## 2013-09-04 DIAGNOSIS — I517 Cardiomegaly: Secondary | ICD-10-CM | POA: Insufficient documentation

## 2013-09-04 DIAGNOSIS — I079 Rheumatic tricuspid valve disease, unspecified: Secondary | ICD-10-CM | POA: Insufficient documentation

## 2013-09-04 NOTE — Progress Notes (Signed)
Echocardiogram 2D Echocardiogram has been performed.  [name] 09/04/2013, 12:27 PM

## 2013-09-06 ENCOUNTER — Encounter: Payer: Self-pay | Admitting: Family Medicine

## 2013-09-11 ENCOUNTER — Other Ambulatory Visit: Payer: Self-pay | Admitting: Family Medicine

## 2013-09-11 ENCOUNTER — Ambulatory Visit (INDEPENDENT_AMBULATORY_CARE_PROVIDER_SITE_OTHER): Payer: Medicare Other

## 2013-09-11 VITALS — BP 112/61 | HR 55 | Resp 20 | Ht 62.0 in | Wt 224.6 lb

## 2013-09-11 DIAGNOSIS — E119 Type 2 diabetes mellitus without complications: Secondary | ICD-10-CM

## 2013-09-11 DIAGNOSIS — B351 Tinea unguium: Secondary | ICD-10-CM

## 2013-09-11 DIAGNOSIS — Q828 Other specified congenital malformations of skin: Secondary | ICD-10-CM

## 2013-09-11 DIAGNOSIS — M79609 Pain in unspecified limb: Secondary | ICD-10-CM

## 2013-09-11 DIAGNOSIS — E1149 Type 2 diabetes mellitus with other diabetic neurological complication: Secondary | ICD-10-CM

## 2013-09-11 DIAGNOSIS — E1142 Type 2 diabetes mellitus with diabetic polyneuropathy: Secondary | ICD-10-CM

## 2013-09-11 NOTE — Progress Notes (Signed)
  Subjective:    Patient ID: [name], female    DOB: 1951/02/13, 62 y.o.   MRN: [id number] "Trim my toenails and these shoes still feel loose even with 2 inserts in them. Patient has diabetes with neuropathy. Last A1c was 6.5. Continues to have thick brittle dystrophic probably nails painful and tender. Also has hyperkeratoses with hemorrhage keratoses distal second digit left foot. HPI Comments: N  Diabetic, thick, hard to cut, painful L  Debridement toenails b/l D  3 months O  gradually C  Gotten worse A  Get too long T  podiatrist      Review of Systems  Constitutional: Negative.   HENT: Negative.   Eyes: Negative.   Respiratory: Negative.   Cardiovascular: Positive for chest pain.  Gastrointestinal: Negative.   Endocrine: Negative.   Genitourinary: Negative.   Musculoskeletal: Positive for arthralgias and back pain.  Skin: Negative.   Allergic/Immunologic: Negative.   Neurological: Positive for dizziness and numbness.  Hematological: Negative.   Psychiatric/Behavioral: The patient is nervous/anxious.        Objective:   Physical Exam  Vitals reviewed. Constitutional: She is oriented to person, place, and time. She appears well-developed and well-nourished.  Cardiovascular:  Pulses:      Dorsalis pedis pulses are 2+ on the right side, and 2+ on the left side.       Posterior tibial pulses are 0 on the right side, and 0 on the left side.  Mild edema bilateral. Capillary refill timed 3-4 seconds all digits bilateral. No varicosities noted. Temperature warm.  Musculoskeletal:  Rectus foot type. Mild semirigid digital contractures 2 through 5 bilateral with a distal clavus second left and pinch callus first bilateral the  Neurological: She is alert and oriented to person, place, and time. She has normal strength and normal reflexes.  Epicritic sensation diminished on Semmes Weinstein testing bilateral to all digits. There is normal plantar response. DTRs not listed.   Skin: Skin is warm and dry. No cyanosis. Nails show no clubbing.  Nails thick friable and discolored and brittle. Tender on palpation with enclosed shoe wear. There is distal clavus with hemorrhage a keratosis distal tuft second digit left foot to digital contracture. There is also keratoses IP joint both hallux secondary to hallux interphalangeus. No open wounds or ulcerations are noted at this time.  Psychiatric: She has a normal mood and affect. Her behavior is normal.          Assessment & Plan:  Assessment diabetes with neuropathy and complications. Digital contractures with keratoses. Debridement of painful mycotic nails 123 and 4 bilateral. The presence of diabetes and complications as well. Debridement of keratoses distal second digit left foot. Maintain appropriate accommodative diabetic shoes at all times. Recheck in 3 months for palliative care.  Alvan Dame DPM

## 2013-09-11 NOTE — Patient Instructions (Signed)

## 2013-10-01 ENCOUNTER — Other Ambulatory Visit: Payer: Self-pay | Admitting: *Deleted

## 2013-10-01 NOTE — Telephone Encounter (Signed)
Fax request for Gabapentin 300 mg #180 two capsules po tid +3 refills. Faxed to [phone].

## 2013-11-07 ENCOUNTER — Encounter: Payer: Self-pay | Admitting: Family Medicine

## 2013-11-07 ENCOUNTER — Ambulatory Visit (INDEPENDENT_AMBULATORY_CARE_PROVIDER_SITE_OTHER): Payer: PRIVATE HEALTH INSURANCE | Admitting: Family Medicine

## 2013-11-07 VITALS — BP 118/71 | HR 69 | Temp 97.8°F | Ht 62.0 in | Wt 226.0 lb

## 2013-11-07 DIAGNOSIS — IMO0001 Reserved for inherently not codable concepts without codable children: Secondary | ICD-10-CM

## 2013-11-07 DIAGNOSIS — M7918 Myalgia, other site: Secondary | ICD-10-CM

## 2013-11-07 MED ORDER — IBUPROFEN 600 MG PO TABS: 600.0000 mg | ORAL_TABLET | Freq: Three times a day (TID) | ORAL | Status: DC | PRN

## 2013-11-07 MED ORDER — KETOROLAC TROMETHAMINE 60 MG/2ML IM SOLN
60.0000 mg | Freq: Once | INTRAMUSCULAR | Status: AC
  Administered 2013-11-07: 60 mg via INTRAMUSCULAR

## 2013-11-07 NOTE — Assessment & Plan Note (Signed)
MSK pain likely from arthritis flare causing change in gate and worsening strain on hips/lower back and SI joint.  FABERs positive Toradol 60 in office (renal fxn nml) Motrin 600 Q6 starting in 24 hrs Exercises/stretches givena nd demonstrated F/u PCP/other PRN in 2-4 wks if no improvement.  May need imaging if concern for hip joint involvement though utility may be low.

## 2013-11-07 NOTE — Progress Notes (Signed)
[name] is a 62 y.o. female who presents to [facility] today for SD appt for hip pain  Hip pain: started 7 days ago. H/o of arthritis in multiple joints which has flared starting a couple weeks ago. Sharp. Comes and goes. Worse w/ standing. Improves w/ rest. Tylenol pm w/o relief. Pain in deep area of upper "butt" per pt. Wakes up at night. Uses a cane to walk intermittently.    The following portions of the patient's history were reviewed and updated as appropriate: allergies, current medications, past medical history, family and social history, and problem list.  Patient is a nonsmoker.    Past Medical History  Diagnosis Date  . Hypertension   . Diabetes mellitus   . Depression   . Asthma   . Eczema   . Arthritis   . Cataract   . GERD (gastroesophageal reflux disease)   . History of kidney stones     w/ hx of hydronephrosis - followed by [facility]  . Anemia   . Obesity   . HIV nonspecific serology     2006: indeterminate HIV blood test, seen by ID, felt secondary to cross reacting antibodies with no further workup felt necessary at that time   . Fatty liver     Fatty infiltration of liver noted on 03/2012 CT scan  . CHF, acute 05/03/2012    ROS as above otherwise neg.    Medications reviewed. Current Outpatient Prescriptions  Medication Sig Dispense Refill  . acetaminophen (TYLENOL) 500 MG tablet Take 1,000 mg by mouth every 6 (six) hours as needed for pain.      Marland Kitchen aspirin 81 MG tablet Take 81 mg by mouth daily.      . benazepril (LOTENSIN) 20 MG tablet Take 1 tablet (20 mg total) by mouth daily.  90 tablet  3  . fish oil-omega-3 fatty acids 1000 MG capsule Take 1 g by mouth 2 (two) times daily.      Marland Kitchen FLOVENT HFA 110 MCG/ACT inhaler inhale 2 puffs by mouth twice a day  1 Inhaler  6  . furosemide (LASIX) 40 MG tablet TAKE 2 TABLETS BY MOUTH DAILY  60 tablet  2  . gabapentin (NEURONTIN) 300 MG capsule Take 300 mg by mouth 3 (three) times daily. Dr Ralene Cork       .  ibuprofen (ADVIL,MOTRIN) 600 MG tablet Take 1 tablet (600 mg total) by mouth every 8 (eight) hours as needed.  30 tablet  0  . Lancets (ONETOUCH ULTRASOFT) lancets TEST ONCE DAILY AS DIRECTED  100 each  11  . metFORMIN (GLUCOPHAGE) 500 MG tablet Take 0.5 tablets (250 mg total) by mouth 2 (two) times daily with a meal.  90 tablet  3  . metoprolol tartrate (LOPRESSOR) 25 MG tablet take 1 tablet by mouth twice a day  60 tablet  11  . ONE TOUCH ULTRA TEST test strip TEST BLOOD GLUCOSE ONCE DAILY  100 each  3  . potassium citrate (UROCIT-K 10) 10 MEQ (1080 MG) SR tablet Take 10 mEq by mouth 2 (two) times daily.       Marland Kitchen PROAIR HFA 108 (90 BASE) MCG/ACT inhaler inhale 2 puffs by mouth every 6 hours if needed for wheezing  8.5 g  11  . ranitidine (ZANTAC) 150 MG tablet take 1 tablet by mouth at bedtime  30 tablet  11  . spironolactone (ALDACTONE) 25 MG tablet TAKE 1/2 TABLET BY MOUTH DAILY  45 tablet  3  . trolamine  salicylate (ASPERCREME) 10 % cream Apply 1 application topically daily. For shoulder and hip pain       No current facility-administered medications for this visit.    Exam:  BP 118/71  Pulse 69  Temp(Src) 97.8 F (36.6 C) (Oral)  Ht 5\' 2"  (1.575 m)  Wt 226 lb (102.513 kg)  BMI 41.33 kg/m2 Gen: Well NAD HEENT: EOMI,  MMM Lungs: CTABL Nl WOB Heart: RRR no MRG Abd: NABS, NT, ND Exts: Non edematous BL  LE, warm and well perfused.  MSK: FROm of hips. Deep buttock pain w/ ROM against resistance. No groin (hip joint) pain. Sensation intact. FABERS + on R  No results found for this or any previous visit (from the past 72 hour(s)).  A/P (as seen in Problem list)  Musculoskeletal pain MSK pain likely from arthritis flare causing change in gate and worsening strain on hips/lower back and SI joint.  FABERs positive Toradol 60 in office (renal fxn nml) Motrin 600 Q6 starting in 24 hrs Exercises/stretches givena nd demonstrated F/u PCP/other PRN in 2-4 wks if no improvement.  May need  imaging if concern for hip joint involvement though utility may be low.

## 2013-11-07 NOTE — Patient Instructions (Signed)
Thank you for coming in today Your hip pain is likely from your arthitis flare in your knees Please start motrin 600 mg every 6 hours tomorrow after lunch Please start the exercises and come back in 2 weeks if you are not better   . Hamstring Syndrome with Rehab Hamstring syndrome is a rare condition that causes pain and sometimes loss of feeling in the back of the thigh, often to the bottom of the foot. Hamstring syndrome is caused by pressure on the sciatic nerve in the hip, by a fibrous tissue that extends between two of the hamstring muscles on the backside of the thigh. The sciatic nerve may also be compressed between the muscles and bones of the pelvis. The hamstring is a collection of three muscles located on the backside of the thigh, that are responsible for straightening the hip and bending the knee. The hamstring muscles are important for walking, running, and jumping. The sciatic nerve usually passes near these muscles, and the pelvis runs under these muscles, in the thigh.  SYMPTOMS   Tingling, numbness, or burning in the back of the thigh to the back of the knee, and sometimes to the bottom of the foot.  Tenderness in the buttock.  Pain and discomfort (burning or dull ache) in the hip or groin, mid-buttock area, the back of the thigh, and sometimes to the knee.  Heaviness or fatigue in the leg.  Pain that worsens when sitting, running fast, kicking, or trying to stretch the hamstring muscles.  Pain that is less strong when laying flat on the back. CAUSES  Hamstring syndrome is caused by pressure on the sciatic nerve in the hip, by either a fibrous tissue or bone. RISK INCREASES WITH:  Sports that require jumping, sprinting, hurdling, or sitting.  Kicking sports (like soccer and football kickers).  Recurring hamstring muscle strains.  Poor strength and flexibility. PREVENTION   Warm up and stretch properly before activity.  Maintain physical fitness:  Strength,  flexibility, and endurance.  Cardiovascular fitness.  Learn and use proper exercise technique. PROGNOSIS  If treated properly, hamstring syndrome usually goes away in 2 to 6 weeks. Occasionally, hamstring syndrome goes away on its own. Rarely, surgery is necessary. RELATED COMPLICATIONS   Permanent numbness in the affected knee, leg, and foot.  Persistent pain in the knee, leg, and foot.  Increasing weakness of the leg.  Disability and inability to compete in sports. TREATMENT  Treatment first involves resting from activities that aggravate your symptoms. The use of anti-inflammatory medications will help reduce pain and inflammation. Strengthening and stretching exercises are important for reducing the severity of symptoms. These exercises may be completed at home or with a therapist. Corticosteroid injections may be given to help reduce inflammation and reduce pain. If non-surgical treatment is unsuccessful, then surgery may be needed, to free the compressed nerve.  MEDICATION  If pain medicine is needed, nonsteroidal anti-inflammatory medicines (aspirin and ibuprofen), or other minor pain relievers (acetaminophen), are often advised.  Do not take pain medicine for 7 days before surgery.  Prescription pain relievers may be given if your caregiver thinks they are needed. Use only as directed and only as much as you need.  Corticosteroid injections may be recommended. However, these injections should only be used for serious cases, as they can only be given a certain number of times. HEAT AND COLD  Cold treatment (icing) relieves pain and reduces inflammation. Cold treatment should be applied for 10 to 15 minutes every 2 to  3 hours, and immediately after activity that aggravates your symptoms. Use ice packs or an ice massage.  Heat treatment may be used before performing the stretching and strengthening activities prescribed by your caregiver, physical therapist, or athletic trainer.  Use a heat pack or a warm water soak. SEEK MEDICAL CARE IF:  Symptoms get worse or do not improve in 2 weeks, despite treatment.  New, unexplained symptoms develop. (Drugs used in treatment may produce side effects.) EXERCISES RANGE OF MOTION (ROM)AND STRETCHING EXERCISES - Hamstring Syndrome These exercises may help you when beginning to rehabilitate your injury. Nerves can be easily irritated by excessive or incorrect movements. Only increase your repetitions with your caregiver's permission.Contact your caregiver if your symptoms get worse while doing any of the prescribed exercises. Your symptoms may go away with or without further involvement from your physician, physical therapist or athletic trainer. While completing these exercises, remember:   Restoring tissue flexibility helps normal motion to return to the joints. This allows healthier, less painful movement and activity.  An effective stretch should be held for at least 30 seconds.  A stretch should never be painful. You should only feel a gentle lengthening or release in the stretched tissue. STRETCH - Hamstrings, Standing  Stand or sit, and extend your right / left leg, placing your foot on a chair or foot stool.  Keep a slight arch in your low back and your hips straight forward.  Lead with your chest and lean forward at the waist, until you feel a gentle stretch in the back of your right / left knee or thigh. (When done correctly, this exercise requires leaning only a small distance.)  Hold this position for __________ seconds. Repeat __________ times. Complete this stretch __________ times per day. STRETCH  Hamstrings, Supine   Lie on your back. Loop a belt or towel over the ball of your right / left foot.  Straighten your right / left knee, and slowly pull on the belt to raise your leg. Do not allow the right / left knee to bend. Keep your opposite leg flat on the floor.  Raise the leg until you feel a gentle stretch  behind your right / left knee or thigh. Hold this position for __________ seconds. Repeat __________ times. Complete this stretch __________ times per day.  STRETCH - Hamstrings, Doorway  Lie on your back with your right / left leg extended and resting on the wall, and the opposite leg flat on the ground through the door. Initially, position your bottom farther away from the wall.  Keep your right / left knee straight. If you feel a stretch behind your knee or thigh, hold this position for __________ seconds.  If you do not feel a stretch, scoot your bottom closer to the door and hold __________ seconds. Repeat __________ times. Complete this stretch __________ times per day.  STRETCH - Hamstrings/Adductors, V-Sit   Sit on the floor with your legs extended in a large "V," keeping your knees straight.  With your head and chest upright, bend at your waist reaching for your left foot to stretch your right thigh muscles.  You should feel a stretch in your right inner thigh. Hold for __________ seconds.  Return to the upright position to relax your leg muscles.  Continuing to keep your chest upright, bend straight forward at your waist to stretch your hamstrings.  You should feel a stretch behind both of your thighs and knees. Hold for __________ seconds.  Return to the  upright position to relax your leg muscles.  With your head and chest upright, bend at your waist reaching for your right foot to stretch your left thigh muscles.  You should feel a stretch in your left inner thigh. Hold for __________ seconds.  Return to the upright position to relax your leg muscles. Repeat __________ times. Complete this exercise __________ times per day.  MOBILIZATION EXERCISES - Hamstring Syndrome Mobilization exercises help trapped nerves to glide freely. When nerves have extra pressure on them, or when they get anchored down by surrounding tissues, they can cause pain, numbness or tingling. When  completing a mobilization exercise, remember:  Nerves are very sensitive tissue. They must be mobilized very gently. Never force a motion and do not push through discomfort.  Mobilize nerves slowly.  Nerves can be very long. Be sure to position all of your body parts exactly as described. MOBILIZATION - Nerve Root   Sit on a firm surface that is high enough for your right / left foot to swing freely. You may place a folded towel under your right / left thigh, if helpful.  Sit with a rounded or slouched back. Drop your head forward.  Keeping your right / left foot relaxed, slowly straighten your right / left knee, until it is fully extended or you feel a slight pull behind your knee or calf.  If you do not feel a slight pull, slowly draw your foot and toes toward you.  Hold for __________ seconds. Release the tension in your knee and ankle slowly. Repeat __________ times. Complete this exercise __________ times per day. Document Released: 11/15/2005 Document Revised: 02/07/2012 Document Reviewed: 02/27/2009 [facility] Patient Information 2014 [place], [place].   Low Back Sprain with Rehab  A sprain is an injury in which a ligament is torn. The ligaments of the lower back are vulnerable to sprains. However, they are strong and require great force to be injured. These ligaments are important for stabilizing the spinal column. Sprains are classified into three categories. Grade 1 sprains cause pain, but the tendon is not lengthened. Grade 2 sprains include a lengthened ligament, due to the ligament being stretched or partially ruptured. With grade 2 sprains there is still function, although the function may be decreased. Grade 3 sprains involve a complete tear of the tendon or muscle, and function is usually impaired. SYMPTOMS   Severe pain in the lower back.  Sometimes, a feeling of a "pop," "snap," or tear, at the time of injury.  Tenderness and sometimes swelling at the injury  site.  Uncommonly, bruising (contusion) within 48 hours of injury.  Muscle spasms in the back. CAUSES  Low back sprains occur when a force is placed on the ligaments that is greater than they can handle. Common causes of injury include:  Performing a stressful act while off-balance.  Repetitive stressful activities that involve movement of the lower back.  Direct hit (trauma) to the lower back. RISK INCREASES WITH:  Contact sports (football, wrestling).  Collisions (major skiing accidents).  Sports that require throwing or lifting (baseball, weightlifting).  Sports involving twisting of the spine (gymnastics, diving, tennis, golf).  Poor strength and flexibility.  Inadequate protection.  Previous back injury or surgery (especially fusion). PREVENTION  Wear properly fitted and padded protective equipment.  Warm up and stretch properly before activity.  Allow for adequate recovery between workouts.  Maintain physical fitness:  Strength, flexibility, and endurance.  Cardiovascular fitness.  Maintain a healthy body weight. PROGNOSIS  If treated properly,  low back sprains usually heal with non-surgical treatment. The length of time for healing depends on the severity of the injury.  RELATED COMPLICATIONS   Recurring symptoms, resulting in a chronic problem.  Chronic inflammation and pain in the low back.  Delayed healing or resolution of symptoms, especially if activity is resumed too soon.  Prolonged impairment.  Unstable or arthritic joints of the low back. TREATMENT  Treatment first involves the use of ice and medicine, to reduce pain and inflammation. The use of strengthening and stretching exercises may help reduce pain with activity. These exercises may be performed at home or with a therapist. Severe injuries may require referral to a therapist for further evaluation and treatment, such as ultrasound. Your caregiver may advise that you wear a back brace or  corset, to help reduce pain and discomfort. Often, prolonged bed rest results in greater harm then benefit. Corticosteroid injections may be recommended. However, these should be reserved for the most serious cases. It is important to avoid using your back when lifting objects. At night, sleep on your back on a firm mattress, with a pillow placed under your knees. If non-surgical treatment is unsuccessful, surgery may be needed.  MEDICATION   If pain medicine is needed, nonsteroidal anti-inflammatory medicines (aspirin and ibuprofen), or other minor pain relievers (acetaminophen), are often advised.  Do not take pain medicine for 7 days before surgery.  Prescription pain relievers may be given, if your caregiver thinks they are needed. Use only as directed and only as much as you need.  Ointments applied to the skin may be helpful.  Corticosteroid injections may be given by your caregiver. These injections should be reserved for the most serious cases, because they may only be given a certain number of times. HEAT AND COLD  Cold treatment (icing) should be applied for 10 to 15 minutes every 2 to 3 hours for inflammation and pain, and immediately after activity that aggravates your symptoms. Use ice packs or an ice massage.  Heat treatment may be used before performing stretching and strengthening activities prescribed by your caregiver, physical therapist, or athletic trainer. Use a heat pack or a warm water soak. SEEK MEDICAL CARE IF:   Symptoms get worse or do not improve in 2 to 4 weeks, despite treatment.  You develop numbness or weakness in either leg.  You lose bowel or bladder function.  Any of the following occur after surgery: fever, increased pain, swelling, redness, drainage of fluids, or bleeding in the affected area.  New, unexplained symptoms develop. (Drugs used in treatment may produce side effects.) EXERCISES  RANGE OF MOTION (ROM) AND STRETCHING EXERCISES - Low Back  Sprain Most people with lower back pain will find that their symptoms get worse with excessive bending forward (flexion) or arching at the lower back (extension). The exercises that will help resolve your symptoms will focus on the opposite motion.  Your physician, physical therapist or athletic trainer will help you determine which exercises will be most helpful to resolve your lower back pain. Do not complete any exercises without first consulting with your caregiver. Discontinue any exercises which make your symptoms worse, until you speak to your caregiver. If you have pain, numbness or tingling which travels down into your buttocks, leg or foot, the goal of the therapy is for these symptoms to move closer to your back and eventually resolve. Sometimes, these leg symptoms will get better, but your lower back pain may worsen. This is often an indication of  progress in your rehabilitation. Be very alert to any changes in your symptoms and the activities in which you participated in the 24 hours prior to the change. Sharing this information with your caregiver will allow him or her to most efficiently treat your condition. These exercises may help you when beginning to rehabilitate your injury. Your symptoms may resolve with or without further involvement from your physician, physical therapist or athletic trainer. While completing these exercises, remember:   Restoring tissue flexibility helps normal motion to return to the joints. This allows healthier, less painful movement and activity.  An effective stretch should be held for at least 30 seconds.  A stretch should never be painful. You should only feel a gentle lengthening or release in the stretched tissue. FLEXION RANGE OF MOTION AND STRETCHING EXERCISES: STRETCH  Flexion, Single Knee to Chest   Lie on a firm bed or floor with both legs extended in front of you.  Keeping one leg in contact with the floor, bring your opposite knee to your  chest. Hold your leg in place by either grabbing behind your thigh or at your knee.  Pull until you feel a gentle stretch in your low back. Hold __________ seconds.  Slowly release your grasp and repeat the exercise with the opposite side. Repeat __________ times. Complete this exercise __________ times per day.  STRETCH  Flexion, Double Knee to Chest  Lie on a firm bed or floor with both legs extended in front of you.  Keeping one leg in contact with the floor, bring your opposite knee to your chest.  Tense your stomach muscles to support your back and then lift your other knee to your chest. Hold your legs in place by either grabbing behind your thighs or at your knees.  Pull both knees toward your chest until you feel a gentle stretch in your low back. Hold __________ seconds.  Tense your stomach muscles and slowly return one leg at a time to the floor. Repeat __________ times. Complete this exercise __________ times per day.  STRETCH  Low Trunk Rotation  Lie on a firm bed or floor. Keeping your legs in front of you, bend your knees so they are both pointed toward the ceiling and your feet are flat on the floor.  Extend your arms out to the side. This will stabilize your upper body by keeping your shoulders in contact with the floor.  Gently and slowly drop both knees together to one side until you feel a gentle stretch in your low back. Hold for __________ seconds.  Tense your stomach muscles to support your lower back as you bring your knees back to the starting position. Repeat the exercise to the other side. Repeat __________ times. Complete this exercise __________ times per day  EXTENSION RANGE OF MOTION AND FLEXIBILITY EXERCISES: STRETCH  Extension, Prone on Elbows   Lie on your stomach on the floor, a bed will be too soft. Place your palms about shoulder width apart and at the height of your head.  Place your elbows under your shoulders. If this is too painful, stack  pillows under your chest.  Allow your body to relax so that your hips drop lower and make contact more completely with the floor.  Hold this position for __________ seconds.  Slowly return to lying flat on the floor. Repeat __________ times. Complete this exercise __________ times per day.  RANGE OF MOTION  Extension, Prone Press Ups  Lie on your stomach on the floor,  a bed will be too soft. Place your palms about shoulder width apart and at the height of your head.  Keeping your back as relaxed as possible, slowly straighten your elbows while keeping your hips on the floor. You may adjust the placement of your hands to maximize your comfort. As you gain motion, your hands will come more underneath your shoulders.  Hold this position __________ seconds.  Slowly return to lying flat on the floor. Repeat __________ times. Complete this exercise __________ times per day.  RANGE OF MOTION- Quadruped, Neutral Spine   Assume a hands and knees position on a firm surface. Keep your hands under your shoulders and your knees under your hips. You may place padding under your knees for comfort.  Drop your head and point your tailbone toward the ground below you. This will round out your lower back like an angry cat. Hold this position for __________ seconds.  Slowly lift your head and release your tail bone so that your back sags into a large arch, like an old horse.  Hold this position for __________ seconds.  Repeat this until you feel limber in your low back.  Now, find your "sweet spot." This will be the most comfortable position somewhere between the two previous positions. This is your neutral spine. Once you have found this position, tense your stomach muscles to support your low back.  Hold this position for __________ seconds. Repeat __________ times. Complete this exercise __________ times per day.  STRENGTHENING EXERCISES - Low Back Sprain These exercises may help you when beginning  to rehabilitate your injury. These exercises should be done near your "sweet spot." This is the neutral, low-back arch, somewhere between fully rounded and fully arched, that is your least painful position. When performed in this safe range of motion, these exercises can be used for people who have either a flexion or extension based injury. These exercises may resolve your symptoms with or without further involvement from your physician, physical therapist or athletic trainer. While completing these exercises, remember:   Muscles can gain both the endurance and the strength needed for everyday activities through controlled exercises.  Complete these exercises as instructed by your physician, physical therapist or athletic trainer. Increase the resistance and repetitions only as guided.  You may experience muscle soreness or fatigue, but the pain or discomfort you are trying to eliminate should never worsen during these exercises. If this pain does worsen, stop and make certain you are following the directions exactly. If the pain is still present after adjustments, discontinue the exercise until you can discuss the trouble with your caregiver. STRENGTHENING Deep Abdominals, Pelvic Tilt   Lie on a firm bed or floor. Keeping your legs in front of you, bend your knees so they are both pointed toward the ceiling and your feet are flat on the floor.  Tense your lower abdominal muscles to press your low back into the floor. This motion will rotate your pelvis so that your tail bone is scooping upwards rather than pointing at your feet or into the floor. With a gentle tension and even breathing, hold this position for __________ seconds. Repeat __________ times. Complete this exercise __________ times per day.  STRENGTHENING  Abdominals, Crunches   Lie on a firm bed or floor. Keeping your legs in front of you, bend your knees so they are both pointed toward the ceiling and your feet are flat on the floor.  Cross your arms over your chest.  Slightly tip  your chin down without bending your neck.  Tense your abdominals and slowly lift your trunk high enough to just clear your shoulder blades. Lifting higher can put excessive stress on the lower back and does not further strengthen your abdominal muscles.  Control your return to the starting position. Repeat __________ times. Complete this exercise __________ times per day.  STRENGTHENING  Quadruped, Opposite UE/LE Lift   Assume a hands and knees position on a firm surface. Keep your hands under your shoulders and your knees under your hips. You may place padding under your knees for comfort.  Find your neutral spine and gently tense your abdominal muscles so that you can maintain this position. Your shoulders and hips should form a rectangle that is parallel with the floor and is not twisted.  Keeping your trunk steady, lift your right hand no higher than your shoulder and then your left leg no higher than your hip. Make sure you are not holding your breath. Hold this position for __________ seconds.  Continuing to keep your abdominal muscles tense and your back steady, slowly return to your starting position. Repeat with the opposite arm and leg. Repeat __________ times. Complete this exercise __________ times per day.  STRENGTHENING  Abdominals and Quadriceps, Straight Leg Raise   Lie on a firm bed or floor with both legs extended in front of you.  Keeping one leg in contact with the floor, bend the other knee so that your foot can rest flat on the floor.  Find your neutral spine, and tense your abdominal muscles to maintain your spinal position throughout the exercise.  Slowly lift your straight leg off the floor about 6 inches for a count of 15, making sure to not hold your breath.  Still keeping your neutral spine, slowly lower your leg all the way to the floor. Repeat this exercise with each leg __________ times. Complete this exercise  __________ times per day. POSTURE AND BODY MECHANICS CONSIDERATIONS - Low Back Sprain Keeping correct posture when sitting, standing or completing your activities will reduce the stress put on different body tissues, allowing injured tissues a chance to heal and limiting painful experiences. The following are general guidelines for improved posture. Your physician or physical therapist will provide you with any instructions specific to your needs. While reading these guidelines, remember:  The exercises prescribed by your provider will help you have the flexibility and strength to maintain correct postures.  The correct posture provides the best environment for your joints to work. All of your joints have less wear and tear when properly supported by a spine with good posture. This means you will experience a healthier, less painful body.  Correct posture must be practiced with all of your activities, especially prolonged sitting and standing. Correct posture is as important when doing repetitive low-stress activities (typing) as it is when doing a single heavy-load activity (lifting). RESTING POSITIONS Consider which positions are most painful for you when choosing a resting position. If you have pain with flexion-based activities (sitting, bending, stooping, squatting), choose a position that allows you to rest in a less flexed posture. You would want to avoid curling into a fetal position on your side. If your pain worsens with extension-based activities (prolonged standing, working overhead), avoid resting in an extended position such as sleeping on your stomach. Most people will find more comfort when they rest with their spine in a more neutral position, neither too rounded nor too arched. Lying on a non-sagging bed on  your side with a pillow between your knees, or on your back with a pillow under your knees will often provide some relief. Keep in mind, being in any one position for a prolonged  period of time, no matter how correct your posture, can still lead to stiffness. PROPER SITTING POSTURE In order to minimize stress and discomfort on your spine, you must sit with correct posture. Sitting with good posture should be effortless for a healthy body. Returning to good posture is a gradual process. Many people can work toward this most comfortably by using various supports until they have the flexibility and strength to maintain this posture on their own. When sitting with proper posture, your ears will fall over your shoulders and your shoulders will fall over your hips. You should use the back of the chair to support your upper back. Your lower back will be in a neutral position, just slightly arched. You may place a small pillow or folded towel at the base of your lower back for  support.  When working at a desk, create an environment that supports good, upright posture. Without extra support, muscles tire, which leads to excessive strain on joints and other tissues. Keep these recommendations in mind: CHAIR:  A chair should be able to slide under your desk when your back makes contact with the back of the chair. This allows you to work closely.  The chair's height should allow your eyes to be level with the upper part of your monitor and your hands to be slightly lower than your elbows. BODY POSITION  Your feet should make contact with the floor. If this is not possible, use a foot rest.  Keep your ears over your shoulders. This will reduce stress on your neck and low back. INCORRECT SITTING POSTURES  If you are feeling tired and unable to assume a healthy sitting posture, do not slouch or slump. This puts excessive strain on your back tissues, causing more damage and pain. Healthier options include:  Using more support, like a lumbar pillow.  Switching tasks to something that requires you to be upright or walking.  Talking a brief walk.  Lying down to rest in a neutral-spine  position. PROLONGED STANDING WHILE SLIGHTLY LEANING FORWARD  When completing a task that requires you to lean forward while standing in one place for a long time, place either foot up on a stationary 2-4 inch high object to help maintain the best posture. When both feet are on the ground, the lower back tends to lose its slight inward curve. If this curve flattens (or becomes too large), then the back and your other joints will experience too much stress, tire more quickly, and can cause pain. CORRECT STANDING POSTURES Proper standing posture should be assumed with all daily activities, even if they only take a few moments, like when brushing your teeth. As in sitting, your ears should fall over your shoulders and your shoulders should fall over your hips. You should keep a slight tension in your abdominal muscles to brace your spine. Your tailbone should point down to the ground, not behind your body, resulting in an over-extended swayback posture.  INCORRECT STANDING POSTURES  Common incorrect standing postures include a forward head, locked knees and/or an excessive swayback. WALKING Walk with an upright posture. Your ears, shoulders and hips should all line-up. PROLONGED ACTIVITY IN A FLEXED POSITION When completing a task that requires you to bend forward at your waist or lean over a low surface,  try to find a way to stabilize 3 out of 4 of your limbs. You can place a hand or elbow on your thigh or rest a knee on the surface you are reaching across. This will provide you more stability, so that your muscles do not tire as quickly. By keeping your knees relaxed, or slightly bent, you will also reduce stress across your lower back. CORRECT LIFTING TECHNIQUES DO :  Assume a wide stance. This will provide you more stability and the opportunity to get as close as possible to the object which you are lifting.  Tense your abdominals to brace your spine. Bend at the knees and hips. Keeping your back  locked in a neutral-spine position, lift using your leg muscles. Lift with your legs, keeping your back straight.  Test the weight of unknown objects before attempting to lift them.  Try to keep your elbows locked down at your sides in order get the best strength from your shoulders when carrying an object.  Always ask for help when lifting heavy or awkward objects. INCORRECT LIFTING TECHNIQUES DO NOT:   Lock your knees when lifting, even if it is a small object.  Bend and twist. Pivot at your feet or move your feet when needing to change directions.  Assume that you can safely pick up even a paperclip without proper posture. Document Released: 11/15/2005 Document Revised: 02/07/2012 Document Reviewed: 02/27/2009 [facility] Patient Information 2014 [place], [place]. Sacroiliac Joint Dysfunction The sacroiliac joint connects the lower part of the spine (the sacrum) with the bones of the pelvis. CAUSES  Sometimes, there is no obvious reason for sacroiliac joint dysfunction. Other times, it may occur   During pregnancy.  After injury, such as:  Car accidents.  Sport-related injuries.  Work-related injuries.  Due to one leg being shorter than the other.  Due to other conditions that affect the joints, such as:  Rheumatoid arthritis.  Gout.  Psoriasis.  Joint infection (septic arthritis). SYMPTOMS  Symptoms may include:  Pain in the:  Lower back.  Buttocks.  Groin.  Thighs and legs.  Difficult sitting, standing, walking, lying, bending or lifting. DIAGNOSIS  A number of tests may be used to help diagnose the cause of sacroiliac joint dysfunction, including:  Imaging tests to look for other causes of pain, including:  MRI.  CT scan.  Bone scan.  Diagnostic injection: During a special x-ray (called fluoroscopy), a needle is put into the sacroiliac joint. A numbing medicine is injected into the joint. If the pain is improved or stopped, the diagnosis of  sacroiliac joint dysfunction is more likely. TREATMENT  There are a number of types of treatment used for sacroiliac joint dysfunction, including:  Only take over-the-counter or prescription medicines for pain, discomfort, or fever as directed by your caregiver.  Medications to relax muscles.  Rest. Decreasing activity can help cut down on painful muscle spasms and allow the back to heal.  Application of heat or ice to the lower back may improve muscle spasms and soothe pain.  Brace. A special back brace, called a sacroiliac belt, can help support the joint while your back is healing.  Physical therapy can help teach comfortable positions and exercises to strengthen muscles that support the sacroiliac joint.  Cortisone injections. Injections of steroid medicine into the joint can help decrease swelling and improve pain.  Hyaluronic acid injections. This chemical improves lubrication within the sacroiliac joint, thereby decreasing pain.  Radiofrequency ablation. A special needle is placed into the joint, where  it burns away nerves that are carrying pain messages from the joint.  Surgery. Because pain occurs during movement of the joint, screws and plates may be installed in order to limit or prevent joint motion. HOME CARE INSTRUCTIONS   Take all medications exactly as directed.  Follow instructions regarding both rest and physical activity, to avoid worsening the pain.  Do physical therapy exercises exactly as prescribed. SEEK IMMEDIATE MEDICAL CARE IF:  You experience increasingly severe pain.  You develop new symptoms, such as numbness or tingling in your legs or feet.  You lose bladder or bowel control. Document Released: 02/11/2009 Document Revised: 02/07/2012 Document Reviewed: 02/11/2009 [facility] Patient Information 2014 [place], [place].

## 2013-11-07 NOTE — Addendum Note (Signed)
Addended by: Jone Baseman D on: 11/07/2013 03:18 PM   Modules accepted: Orders

## 2013-11-25 ENCOUNTER — Other Ambulatory Visit: Payer: Self-pay | Admitting: Family Medicine

## 2013-12-03 ENCOUNTER — Other Ambulatory Visit: Payer: Self-pay | Admitting: Family Medicine

## 2013-12-03 MED ORDER — FLUTICASONE PROPIONATE HFA 110 MCG/ACT IN AERO: 2.0000 | INHALATION_SPRAY | Freq: Two times a day (BID) | RESPIRATORY_TRACT | Status: DC

## 2013-12-10 ENCOUNTER — Other Ambulatory Visit: Payer: Self-pay

## 2013-12-10 DIAGNOSIS — Z1231 Encounter for screening mammogram for malignant neoplasm of breast: Secondary | ICD-10-CM

## 2013-12-12 ENCOUNTER — Ambulatory Visit (INDEPENDENT_AMBULATORY_CARE_PROVIDER_SITE_OTHER): Payer: Medicare Other

## 2013-12-12 VITALS — BP 115/69 | HR 71 | Resp 18

## 2013-12-12 DIAGNOSIS — E1142 Type 2 diabetes mellitus with diabetic polyneuropathy: Secondary | ICD-10-CM

## 2013-12-12 DIAGNOSIS — E1149 Type 2 diabetes mellitus with other diabetic neurological complication: Secondary | ICD-10-CM

## 2013-12-12 DIAGNOSIS — M79609 Pain in unspecified limb: Secondary | ICD-10-CM

## 2013-12-12 DIAGNOSIS — B351 Tinea unguium: Secondary | ICD-10-CM

## 2013-12-12 NOTE — Progress Notes (Signed)
   Subjective:    Patient ID: WILLEAN SCHURMAN, female    DOB: Oct 08, 1951, 63 y.o.   MRN: [id number]  HPI I just need my toenails trimmed up    Review of Systems no new changes or findings at this visit     Objective:   Physical Exam Vascular status is intact as follows pedal pulses palpable DP postal for bilateral PT thready pulse one over 4 right nonpalpable on the left there is +1 edema bilateral. Neurologically epicritic and proprioceptive sensations intact and symmetric although decreased sensation to the toes plantar forefoot. Patient also scribing some abnormal sensations in the right foot more so than left but recently seen her diabetic neuropathy. Dermatologically nails thick brittle friable discolored incurvated 1 through 5 bilateral. There are no open wounds ulcerations of secondary infections current time. No significant keratoses noted flexible digital contractures noted bilateral.       Assessment & Plan:  Assessment this time his diabetes with peripheral neuropathy. History of onychomycosis friable brittle discolored incurvated nails 1 through 5 bilateral debridement at this time return in 3 months for continued followup and palliative diabetic foot care. Maintain patient is maintaining diabetic shoe wear as instructed. Reappointed in 3 months for followup  Harriet Masson DPM

## 2013-12-12 NOTE — Patient Instructions (Signed)
Diabetes and Foot Care Diabetes may cause you to have problems because of poor blood supply (circulation) to your feet and legs. This may cause the skin on your feet to become thinner, break easier, and heal more slowly. Your skin may become dry, and the skin may peel and crack. You may also have nerve damage in your legs and feet causing decreased feeling in them. You may not notice minor injuries to your feet that could lead to infections or more serious problems. Taking care of your feet is one of the most important things you can do for yourself.  HOME CARE INSTRUCTIONS  Wear shoes at all times, even in the house. Do not go barefoot. Bare feet are easily injured.  Check your feet daily for blisters, cuts, and redness. If you cannot see the bottom of your feet, use a mirror or ask someone for help.  Wash your feet with warm water (do not use hot water) and mild soap. Then pat your feet and the areas between your toes until they are completely dry. Do not soak your feet as this can dry your skin.  Apply a moisturizing lotion or petroleum jelly (that does not contain alcohol and is unscented) to the skin on your feet and to dry, brittle toenails. Do not apply lotion between your toes.  Trim your toenails straight across. Do not dig under them or around the cuticle. File the edges of your nails with an emery board or nail file.  Do not cut corns or calluses or try to remove them with medicine.  Wear clean socks or stockings every day. Make sure they are not too tight. Do not wear knee-high stockings since they may decrease blood flow to your legs.  Wear shoes that fit properly and have enough cushioning. To break in new shoes, wear them for just a few hours a day. This prevents you from injuring your feet. Always look in your shoes before you put them on to be sure there are no objects inside.  Do not cross your legs. This may decrease the blood flow to your feet.  If you find a minor scrape,  cut, or break in the skin on your feet, keep it and the skin around it clean and dry. These areas may be cleansed with mild soap and water. Do not cleanse the area with peroxide, alcohol, or iodine.  When you remove an adhesive bandage, be sure not to damage the skin around it.  If you have a wound, look at it several times a day to make sure it is healing.  Do not use heating pads or hot water bottles. They may burn your skin. If you have lost feeling in your feet or legs, you may not know it is happening until it is too late.  Make sure your health care provider performs a complete foot exam at least annually or more often if you have foot problems. Report any cuts, sores, or bruises to your health care provider immediately. SEEK MEDICAL CARE IF:   You have an injury that is not healing.  You have cuts or breaks in the skin.  You have an ingrown nail.  You notice redness on your legs or feet.  You feel burning or tingling in your legs or feet.  You have pain or cramps in your legs and feet.  Your legs or feet are numb.  Your feet always feel cold. SEEK IMMEDIATE MEDICAL CARE IF:   There is increasing redness,   swelling, or pain in or around a wound.  There is a red line that goes up your leg.  Pus is coming from a wound.  You develop a fever or as directed by your health care provider.  You notice a bad smell coming from an ulcer or wound. Document Released: 11/12/2000 Document Revised: 07/18/2013 Document Reviewed: 04/24/2013 [employer] Patient Information 2014 [employer]  

## 2013-12-31 ENCOUNTER — Encounter: Payer: Self-pay | Admitting: Family Medicine

## 2013-12-31 ENCOUNTER — Ambulatory Visit (INDEPENDENT_AMBULATORY_CARE_PROVIDER_SITE_OTHER): Payer: PRIVATE HEALTH INSURANCE | Admitting: Family Medicine

## 2013-12-31 VITALS — BP 130/78 | HR 93 | Temp 98.5°F | Ht 62.0 in | Wt 227.0 lb

## 2013-12-31 DIAGNOSIS — M21969 Unspecified acquired deformity of unspecified lower leg: Secondary | ICD-10-CM

## 2013-12-31 DIAGNOSIS — Z13220 Encounter for screening for lipoid disorders: Secondary | ICD-10-CM

## 2013-12-31 DIAGNOSIS — E1169 Type 2 diabetes mellitus with other specified complication: Secondary | ICD-10-CM

## 2013-12-31 DIAGNOSIS — I10 Essential (primary) hypertension: Secondary | ICD-10-CM

## 2013-12-31 DIAGNOSIS — Z23 Encounter for immunization: Secondary | ICD-10-CM

## 2013-12-31 DIAGNOSIS — E119 Type 2 diabetes mellitus without complications: Secondary | ICD-10-CM

## 2013-12-31 DIAGNOSIS — I5022 Chronic systolic (congestive) heart failure: Secondary | ICD-10-CM

## 2013-12-31 DIAGNOSIS — I509 Heart failure, unspecified: Secondary | ICD-10-CM

## 2013-12-31 LAB — POCT GLYCOSYLATED HEMOGLOBIN (HGB A1C): Hemoglobin A1C: 6.1

## 2013-12-31 MED ORDER — SPIRONOLACTONE 25 MG PO TABS: 25.0000 mg | ORAL_TABLET | Freq: Every day | ORAL | Status: DC

## 2013-12-31 NOTE — Assessment & Plan Note (Signed)
Dong well.  Will increase spironolactone to 25 mg and check bmet

## 2013-12-31 NOTE — Assessment & Plan Note (Signed)
Well controlled.  Continue low dose metformin

## 2013-12-31 NOTE — Progress Notes (Signed)
   Subjective:    Patient ID: ELBA SCHABER, female    DOB: [birth year], 63 y.o.   MRN: [id number]  HPI  CHF No change in her symptoms.  Minimal shortness of breath with exertion, no chest pain.  No leg swelling if she takes her lasix.  No lightheadness or syncope  HYPERTENSION Disease Monitoring: Blood pressure range-blood pressure ranges all < 140/90 Chest pain, palpitations- no      Dyspnea- no Medications: Compliance- knows all her medications and brrings them in Lightheadedness,Syncope- no   Edema- no  DIABETES Disease Monitoring: Blood Sugar ranges-between 110-180 Polyuria/phagia/dipsia- no      Visual problems- no Medications: Compliance- daily metformin Hypoglycemic symptoms- none  ROS See HPI above   PMH Smoking Status noted  Lab Review   Potassium  Date Value Range Status  08/27/2013 4.3  3.5 - 5.3 mEq/L Final     Sodium  Date Value Range Status  08/27/2013 139  135 - 145 mEq/L Final     Creat  Date Value Range Status  08/27/2013 1.08  0.50 - 1.10 mg/dL Final     Creatinine, Ser  Date Value Range Status  03/28/2013 0.82  0.50 - 1.10 mg/dL Final      Review of Symptoms - see HPI  PMH - Smoking status noted.     Review of Systems     Objective:   Physical Exam  Alert no acute distress Heart - Regular rate and rhythm.  No murmurs, gallops or rubs.    Lungs:  Normal respiratory effort, chest expands symmetrically. Lungs are clear to auscultation, no crackles or wheezes. Extremities:  No cyanosis, edema, or deformity noted with good range of motion of all major joints.   Diabetic Foot Check -  Appearance - no lesions, ulcers or calluses Skin - no unusual pallor or redness Monofilament testing -  Right - Great toe, medial, central, lateral ball and posterior foot intact Left - Great toe, medial, central, lateral ball and posterior foot intact       Assessment & Plan:

## 2013-12-31 NOTE — Assessment & Plan Note (Signed)
At goal continue current meds 

## 2013-12-31 NOTE — Patient Instructions (Addendum)
Good to see you today!  Thanks for coming in.  Come back on Friday AM for a blood test - come in fasting   I will call you if your tests are not good.  Otherwise I will send you a letter.  If you do not hear from me with in 2 weeks please call our office.     Come back in 6 months

## 2014-01-01 DIAGNOSIS — E1169 Type 2 diabetes mellitus with other specified complication: Secondary | ICD-10-CM | POA: Insufficient documentation

## 2014-01-01 DIAGNOSIS — M21969 Unspecified acquired deformity of unspecified lower leg: Secondary | ICD-10-CM

## 2014-01-04 ENCOUNTER — Other Ambulatory Visit: Payer: PRIVATE HEALTH INSURANCE

## 2014-01-04 DIAGNOSIS — I10 Essential (primary) hypertension: Secondary | ICD-10-CM

## 2014-01-04 LAB — BASIC METABOLIC PANEL
BUN: 31 mg/dL — AB (ref 6–23)
CHLORIDE: 100 meq/L (ref 96–112)
CO2: 30 meq/L (ref 19–32)
Calcium: 9.6 mg/dL (ref 8.4–10.5)
Creat: 1.3 mg/dL — ABNORMAL HIGH (ref 0.50–1.10)
Glucose, Bld: 151 mg/dL — ABNORMAL HIGH (ref 70–99)
Potassium: 4.3 mEq/L (ref 3.5–5.3)
Sodium: 140 mEq/L (ref 135–145)

## 2014-01-04 NOTE — Progress Notes (Signed)
BMP DONE TODAY Aylen Rambert 

## 2014-01-07 ENCOUNTER — Encounter: Payer: Self-pay | Admitting: Family Medicine

## 2014-01-11 ENCOUNTER — Ambulatory Visit
Admission: RE | Admit: 2014-01-11 | Discharge: 2014-01-11 | Disposition: A | Discharge status: Home / Self Care | Payer: PRIVATE HEALTH INSURANCE | Source: Ambulatory Visit

## 2014-01-11 DIAGNOSIS — Z1231 Encounter for screening mammogram for malignant neoplasm of breast: Secondary | ICD-10-CM

## 2014-01-17 ENCOUNTER — Ambulatory Visit (INDEPENDENT_AMBULATORY_CARE_PROVIDER_SITE_OTHER): Payer: PRIVATE HEALTH INSURANCE | Admitting: Home Health Services

## 2014-01-17 ENCOUNTER — Encounter: Payer: Self-pay | Admitting: Home Health Services

## 2014-01-17 VITALS — BP 97/64 | HR 58 | Temp 97.8°F | Ht 62.0 in | Wt 229.4 lb

## 2014-01-17 DIAGNOSIS — Z0000 Encounter for general adult medical examination without abnormal findings: Secondary | ICD-10-CM

## 2014-01-18 ENCOUNTER — Encounter: Payer: Self-pay | Admitting: Home Health Services

## 2014-01-18 NOTE — Progress Notes (Signed)
Patient here for annual wellness visit, patient reports: Risk Factors/Conditions needing evaluation or treatment: Pt does not have any new risk factors that need evaluation. Home Safety: Pt live at home with husband.  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 Other Information: Corrective lens: Pt wears daily corrective lens.  Has annual eye exams. Dentures: Pt does not have dentures.  Has bi-annual dental exams. Memory: Pt denies memory problems Patient's Mini Mental Score (recorded in doc. flowsheet): 30 Pt reports not problems with hearing or vision. Balance/Gait: Pt reports not falls in the past 12 months.  Reports no weakness in legs or imbalance. Pt ADL/IADLS are independent.  Physical Activity: Pt reports exercising 1x a week with church group.  We discussed adding 2-3 days at home or silver sneakers program. Reducing Risk of Falls:  We discussed home strategies to avoid falls including well light rooms, removing area rugs/cables, regular execise, and adaptive equipment in bathroom.  Medication Adherence:  We discussed importance for medication adherence for diabetes and htn medications.  Pt expressed understanding.  Pt is not currently on any statins.     Annual Wellness Visit Requirements Recorded Today In  Medical, family, social history Past Medical, Family, Social History Section  Current providers Care team  Current medications Medications  Wt, BP, Ht, BMI Vital signs  Tobacco, alcohol, illicit drug use History  ADL Nurse Assessment  Depression Screening Nurse Assessment  Cognitive impairment Nurse Assessment  Mini Mental Status Document Flowsheet  Fall Risk Fall/Depression  Home Safety Progress Note  End of Life Planning (welcome visit) Social Documentation  Medicare preventative services Progress Note  Risk factors/conditions needing evaluation/treatment Progress Note  Personalized health advice Patient Instructions, goals, letter  Diet & Exercise Social  Documentation  Emergency Contact Social Documentation  Seat Belts Social Documentation  Sun exposure/protection Social Documentation    I have reviewed this visit and discussed with [name] and agree with her documentation  CHAMBLISS,MARSHALL L

## 2014-01-23 ENCOUNTER — Encounter: Payer: Self-pay | Admitting: Home Health Services

## 2014-01-27 ENCOUNTER — Encounter (HOSPITAL_COMMUNITY): Payer: Self-pay | Admitting: Emergency Medicine

## 2014-01-27 ENCOUNTER — Emergency Department (HOSPITAL_COMMUNITY)
Admission: EM | Admit: 2014-01-27 | Discharge: 2014-01-27 | Disposition: A | Discharge status: Home / Self Care | Payer: PRIVATE HEALTH INSURANCE | Attending: Emergency Medicine | Admitting: Emergency Medicine

## 2014-01-27 ENCOUNTER — Emergency Department (HOSPITAL_COMMUNITY): Payer: PRIVATE HEALTH INSURANCE

## 2014-01-27 DIAGNOSIS — E119 Type 2 diabetes mellitus without complications: Secondary | ICD-10-CM | POA: Insufficient documentation

## 2014-01-27 DIAGNOSIS — Z87442 Personal history of urinary calculi: Secondary | ICD-10-CM | POA: Insufficient documentation

## 2014-01-27 DIAGNOSIS — L259 Unspecified contact dermatitis, unspecified cause: Secondary | ICD-10-CM | POA: Insufficient documentation

## 2014-01-27 DIAGNOSIS — D649 Anemia, unspecified: Secondary | ICD-10-CM | POA: Insufficient documentation

## 2014-01-27 DIAGNOSIS — I509 Heart failure, unspecified: Secondary | ICD-10-CM | POA: Insufficient documentation

## 2014-01-27 DIAGNOSIS — M129 Arthropathy, unspecified: Secondary | ICD-10-CM | POA: Insufficient documentation

## 2014-01-27 DIAGNOSIS — E669 Obesity, unspecified: Secondary | ICD-10-CM | POA: Insufficient documentation

## 2014-01-27 DIAGNOSIS — S63501A Unspecified sprain of right wrist, initial encounter: Secondary | ICD-10-CM

## 2014-01-27 DIAGNOSIS — Y929 Unspecified place or not applicable: Secondary | ICD-10-CM | POA: Insufficient documentation

## 2014-01-27 DIAGNOSIS — I10 Essential (primary) hypertension: Secondary | ICD-10-CM | POA: Insufficient documentation

## 2014-01-27 DIAGNOSIS — K219 Gastro-esophageal reflux disease without esophagitis: Secondary | ICD-10-CM | POA: Insufficient documentation

## 2014-01-27 DIAGNOSIS — H269 Unspecified cataract: Secondary | ICD-10-CM | POA: Insufficient documentation

## 2014-01-27 DIAGNOSIS — R75 Inconclusive laboratory evidence of human immunodeficiency virus [HIV]: Secondary | ICD-10-CM | POA: Insufficient documentation

## 2014-01-27 DIAGNOSIS — K7689 Other specified diseases of liver: Secondary | ICD-10-CM | POA: Insufficient documentation

## 2014-01-27 DIAGNOSIS — Z79899 Other long term (current) drug therapy: Secondary | ICD-10-CM | POA: Insufficient documentation

## 2014-01-27 DIAGNOSIS — Y93F9 Activity, other caregiving: Secondary | ICD-10-CM | POA: Insufficient documentation

## 2014-01-27 DIAGNOSIS — S63509A Unspecified sprain of unspecified wrist, initial encounter: Secondary | ICD-10-CM | POA: Insufficient documentation

## 2014-01-27 DIAGNOSIS — J45909 Unspecified asthma, uncomplicated: Secondary | ICD-10-CM | POA: Insufficient documentation

## 2014-01-27 DIAGNOSIS — X500XXA Overexertion from strenuous movement or load, initial encounter: Secondary | ICD-10-CM | POA: Insufficient documentation

## 2014-01-27 DIAGNOSIS — F329 Major depressive disorder, single episode, unspecified: Secondary | ICD-10-CM | POA: Insufficient documentation

## 2014-01-27 DIAGNOSIS — F3289 Other specified depressive episodes: Secondary | ICD-10-CM | POA: Insufficient documentation

## 2014-01-27 MED ORDER — OXYCODONE-ACETAMINOPHEN 5-325 MG PO TABS: 1.0000 | ORAL_TABLET | Freq: Four times a day (QID) | ORAL | Status: DC | PRN

## 2014-01-27 MED ORDER — IBUPROFEN 400 MG PO TABS
600.0000 mg | ORAL_TABLET | Freq: Once | ORAL | Status: AC
  Administered 2014-01-27: 600 mg via ORAL
  Filled 2014-01-27 (×2): qty 1

## 2014-01-27 MED ORDER — OXYCODONE-ACETAMINOPHEN 5-325 MG PO TABS
2.0000 | ORAL_TABLET | Freq: Once | ORAL | Status: AC
  Administered 2014-01-27: 2 via ORAL
  Filled 2014-01-27: qty 2

## 2014-01-27 NOTE — ED Notes (Signed)
Patient transported to X-ray 

## 2014-01-27 NOTE — ED Notes (Signed)
Patient returned from xray.

## 2014-01-27 NOTE — ED Provider Notes (Signed)
CSN: [id number]     Arrival date & time 01/27/14  6213 History   First MD Initiated Contact with Patient 01/27/14 [phone]     Chief Complaint  Patient presents with  . Arm Pain     (Consider location/radiation/quality/duration/timing/severity/associated sxs/prior Treatment) Patient is a 63 y.o. female presenting with arm pain.  Arm Pain Associated symptoms include headaches.   63 yo female presents with Right forearm pain that started 3 weeks ago after trying to help her husband get out of bed. Patient states she has tried soaking affected arm, wrapping, and ibuprofen that helped but states pain started up again yesterday and gradually worsened throughout the night. Denies any increase in physical activity or arm use. Patient describes pain as throbbing and burning. Patient states pain is localized to the RIght arm. Worst in the wrist but extend up forearm. Pain is described as constant. Denies any recent trauma, surgery, hx of cancer, exogenous estrogen use. Admits to DVT in Right leg about 1 year ago. Denies any fever/chills, Chest pain, dizziness. Patient admits to nausea and occasional DOE "off and on for about 10 years".  PMH significant for HTN, T2DM, Asthma, and CHF.  Past Medical History  Diagnosis Date  . Hypertension   . Diabetes mellitus   . Depression   . Asthma   . Eczema   . Arthritis   . Cataract   . GERD (gastroesophageal reflux disease)   . History of kidney stones     w/ hx of hydronephrosis - followed by [facility]  . Anemia   . Obesity   . HIV nonspecific serology     2006: indeterminate HIV blood test, seen by ID, felt secondary to cross reacting antibodies with no further workup felt necessary at that time   . Fatty liver     Fatty infiltration of liver noted on 03/2012 CT scan  . CHF, acute 05/03/2012   Past Surgical History  Procedure Laterality Date  . Cystoscopy w/ litholapaxy / ehl    . Cholecystectomy  2003  . Replacement total knee bilateral  2005  &2006  . Joint replacement      bilateral knee replacement  . Vascular surgery Right 03/15/2013    Ultrasound guided sclerotherapy   Family History  Problem Relation Age of Onset  . Diabetes Mother   . Stroke Mother   . Heart disease Father   . Anemia Father    History  Substance Use Topics  . Smoking status: Never Smoker   . Smokeless tobacco: Never Used  . Alcohol Use: No   OB History   Grav Para Term Preterm Abortions TAB SAB Ect Mult Living                 Review of Systems  Neurological: Positive for headaches.  All other systems reviewed and are negative.      Allergies  Review of patient's allergies indicates no known allergies.  Home Medications   Current Outpatient Rx  Name  Route  Sig  Dispense  Refill  . acetaminophen (TYLENOL) 500 MG tablet   Oral   Take 1,000 mg by mouth every 6 (six) hours as needed for pain.         Marland Kitchen aspirin 81 MG tablet   Oral   Take 81 mg by mouth daily.         . benazepril (LOTENSIN) 20 MG tablet   Oral   Take 1 tablet (20 mg total) by mouth daily.  90 tablet   3   . fish oil-omega-3 fatty acids 1000 MG capsule   Oral   Take 1 g by mouth 2 (two) times daily.         . fluticasone (FLOVENT HFA) 110 MCG/ACT inhaler   Inhalation   Inhale 2 puffs into the lungs 2 (two) times daily.   12 g   6   . furosemide (LASIX) 40 MG tablet   Oral   Take 80 mg by mouth daily.         Marland Kitchen gabapentin (NEURONTIN) 300 MG capsule   Oral   Take 300 mg by mouth 3 (three) times daily. Dr Blenda Mounts          . ibuprofen (ADVIL,MOTRIN) 600 MG tablet   Oral   Take 600 mg by mouth every 8 (eight) hours as needed.         . Lancets (ONETOUCH ULTRASOFT) lancets      TEST ONCE DAILY AS DIRECTED   100 each   11   . metFORMIN (GLUCOPHAGE) 500 MG tablet   Oral   Take 0.5 tablets (250 mg total) by mouth 2 (two) times daily with a meal.   90 tablet   3   . metoprolol tartrate (LOPRESSOR) 25 MG tablet      take 1 tablet by  mouth twice a day   60 tablet   11   . ONE TOUCH ULTRA TEST test strip      TEST BLOOD GLUCOSE ONCE DAILY   100 each   3   . potassium citrate (UROCIT-K 10) 10 MEQ (1080 MG) SR tablet   Oral   Take 10 mEq by mouth 2 (two) times daily.          Marland Kitchen PROAIR HFA 108 (90 BASE) MCG/ACT inhaler      inhale 2 puffs by mouth every 6 hours if needed for wheezing   8.5 g   11   . ranitidine (ZANTAC) 150 MG tablet   Oral   Take 150 mg by mouth at bedtime.         Marland Kitchen spironolactone (ALDACTONE) 25 MG tablet   Oral   Take 1 tablet (25 mg total) by mouth daily.   90 tablet   1   . trolamine salicylate (ASPERCREME) 10 % cream   Topical   Apply 1 application topically daily. For shoulder and hip pain         . oxyCODONE-acetaminophen (PERCOCET) 5-325 MG per tablet   Oral   Take 1-2 tablets by mouth every 6 (six) hours as needed.   20 tablet   0    BP 112/65  Pulse 73  Temp(Src) 98.4 F (36.9 C) (Oral)  Resp 16  Ht 5\' 2"  (1.575 m)  Wt 227 lb (102.967 kg)  BMI 41.51 kg/m2  SpO2 94% Physical Exam  Nursing note and vitals reviewed. Constitutional: She is oriented to person, place, and time. She appears well-developed and well-nourished. No distress.  HENT:  Head: Normocephalic and atraumatic.  Mouth/Throat: Oropharynx is clear and moist.  Eyes: Conjunctivae and EOM are normal. Pupils are equal, round, and reactive to light.  Neck: Normal range of motion and full passive range of motion without pain. Neck supple. No JVD present. No spinous process tenderness and no muscular tenderness present. No rigidity. No tracheal deviation, no edema, no erythema and normal range of motion present.  Cardiovascular: Normal rate, regular rhythm and intact distal pulses.  Exam reveals no gallop  and no friction rub.   No murmur heard. Pulmonary/Chest: Effort normal. No respiratory distress. She has no wheezes. She has no rhonchi. She has no rales.  Musculoskeletal: Normal range of motion. She  exhibits tenderness. She exhibits no edema.       Right shoulder: She exhibits no bony tenderness, no swelling, no effusion and normal strength.       Right elbow: She exhibits normal range of motion, no swelling, no effusion and no deformity.       Right wrist: She exhibits swelling (mild).       Arms: Minimal decreased grip strength in RIGHT hand compared to LEFT.   Mild swelling of wrist. Tender to touch. No warmth or erythema.   Lymphadenopathy:    She has no cervical adenopathy.  Neurological: She is alert and oriented to person, place, and time.  Cervical compression does not illicit pain.   Skin: Skin is warm and dry. She is not diaphoretic.  Psychiatric: She has a normal mood and affect. Her behavior is normal.    ED Course  Procedures (including critical care time) Labs Review Labs Reviewed - No data to display Imaging Review Dg Forearm Right  01/27/2014   CLINICAL DATA:  Pain without trauma  EXAM: RIGHT FOREARM - 2 VIEW  COMPARISON:  None.  FINDINGS: Marginal spurring in the elbow. No fracture, dislocation, or other acute bone abnormality. Normal mineralization and alignment. No effusion.  IMPRESSION: 1. Negative for fracture or other acute bone abnormality. 2. Elbow degenerative spurring.   Electronically Signed   By: Arne Cleveland M.D.   On: 01/27/2014 09:38   Dg Hand Complete Right  01/27/2014   CLINICAL DATA:  Pain without trauma  EXAM: RIGHT HAND - COMPLETE 3+ VIEW  COMPARISON:  None.  FINDINGS: There is mild widening of the scapholunate space. Carpal rows otherwise intact. Negative for fracture, dislocation, or other acute bone abnormality. Normal mineralization . Regional soft tissues unremarkable.  IMPRESSION: 1. Negative for fracture or other acute bone abnormality. 2. Widening of scapholunate space suggesting ligamentous injury.   Electronically Signed   By: Arne Cleveland M.D.   On: 01/27/2014 09:40     EKG Interpretation None      MDM   Final diagnoses:    Right wrist sprain    Patient pain improved with tx in ED. Plan to have patient placed in splint and follow up with Hand specialist within 1 week. Recommend RICE therapy. Return precautions given for worsening pain, numbness, or weakness of affected limb.        Meds given in ED:  Medications  oxyCODONE-acetaminophen (PERCOCET/ROXICET) 5-325 MG per tablet 2 tablet (2 tablets Oral Given 01/27/14 0853)    New Prescriptions   OXYCODONE-ACETAMINOPHEN (PERCOCET) 5-325 MG PER TABLET    Take 1-2 tablets by mouth every 6 (six) hours as needed.       Sherrie George, PA-C 01/27/14 2103

## 2014-01-27 NOTE — ED Notes (Signed)
Pt reports right arm pain that started about a week ago when she was helping her husband move in the bed. Reports that pain is worse with movement and palpation. Denies any chest pain or SOB.

## 2014-01-27 NOTE — ED Notes (Signed)
Patient helped to the restroom. Patient became short of breath. Patient denies using oxygen at home, and states that she normally becomes Short of breath while walking. Patient's oxygen saturation 99% on RA.

## 2014-01-27 NOTE — Discharge Instructions (Signed)
Follow up with hand specialist as listed above in "follow up" section. Take pain medication as directed. Do not drive with pain medication. Return to emergency department if you develop any worsening pain, swelling, numbness, or weakness in the affected extremity.    Sprain A sprain happens when the bands of tissue that connect bones and hold joints together (ligaments) stretch too much or tear. HOME CARE  Raise (elevate) the injured area to lessen puffiness (swelling).  Put ice on the injured area 2 times a day for 2 3 days.  Put ice in a plastic bag.  Place a towel between your skin and the bag.  Leave the ice on for 15 minutes.  Only take medicine as told by your doctor.  Protect your injured area until your pain and stiffness go away.  Do not get your cast or splint wet. Cover your cast or splint with a plastic bag when you shower or take a bath. Do not swim in a pool.  Your doctor may suggest exercises during your recovery to keep from getting stiff. GET HELP RIGHT AWAY IF:   Your cast or splint becomes damaged.  Your pain gets worse. MAKE SURE YOU:   Understand these instructions.  Will watch this condition.  Will get help right away if you are not doing well or get worse. Document Released: 05/03/2008 Document Revised: 09/05/2013 Document Reviewed: 11/27/2011 [facility] Patient Information 2014 [place], [place].

## 2014-01-29 NOTE — ED Provider Notes (Signed)
Medical screening examination/treatment/procedure(s) were conducted as a shared visit with non-physician practitioner(s) and myself.  I personally evaluated the patient during the encounter.   EKG Interpretation None      Patient with progressive right wrist pain since helping to lift her husband. The patient's wrist is swollen and x-ray shows concern for ligamentous injury without fracture. We'll splint, recommend ice, NSAIDs, and rest. We'll give narcotics for breakthrough pain.  Ephraim Hamburger, MD 01/29/14 [phone]

## 2014-02-06 ENCOUNTER — Telehealth: Payer: Self-pay | Admitting: *Deleted

## 2014-02-06 MED ORDER — GABAPENTIN 300 MG PO CAPS: ORAL_CAPSULE | ORAL | Status: DC

## 2014-02-06 NOTE — Telephone Encounter (Signed)
[facility] faxed request for refill Gabapentin 300mg  #180 2 capsules po tid.This differs from the rx in EPIC and I reviewed with Dr Blenda Mounts.  Dr Blenda Mounts states rx is Gabapentin 300mg  2 capsules po tid.  I will electronically change and order with [facility].

## 2014-02-07 ENCOUNTER — Telehealth: Payer: Self-pay | Admitting: *Deleted

## 2014-02-07 MED ORDER — CLOTRIMAZOLE-BETAMETHASONE 1-0.05 % EX CREA: 1.0000 "application " | TOPICAL_CREAM | Freq: Two times a day (BID) | CUTANEOUS | Status: DC

## 2014-02-07 NOTE — Telephone Encounter (Signed)
[facility] faxed request for Clotrimazole 1% Cream.  Dr Blenda Mounts ordered refill prn 1 year.

## 2014-03-01 ENCOUNTER — Other Ambulatory Visit: Payer: Self-pay | Admitting: Family Medicine

## 2014-03-12 ENCOUNTER — Ambulatory Visit (INDEPENDENT_AMBULATORY_CARE_PROVIDER_SITE_OTHER): Payer: Medicare Other

## 2014-03-12 VITALS — BP 143/78 | HR 69 | Resp 16

## 2014-03-12 DIAGNOSIS — E1142 Type 2 diabetes mellitus with diabetic polyneuropathy: Secondary | ICD-10-CM

## 2014-03-12 DIAGNOSIS — B351 Tinea unguium: Secondary | ICD-10-CM

## 2014-03-12 DIAGNOSIS — E1149 Type 2 diabetes mellitus with other diabetic neurological complication: Secondary | ICD-10-CM

## 2014-03-12 DIAGNOSIS — M21969 Unspecified acquired deformity of unspecified lower leg: Secondary | ICD-10-CM

## 2014-03-12 DIAGNOSIS — E1169 Type 2 diabetes mellitus with other specified complication: Secondary | ICD-10-CM

## 2014-03-12 DIAGNOSIS — M79609 Pain in unspecified limb: Secondary | ICD-10-CM

## 2014-03-12 NOTE — Progress Notes (Signed)
   Subjective:    Patient ID: ORMA CHEETHAM, female    DOB: 1951-10-18, 63 y.o.   MRN: [id number]  HPI patient presents this time for diabetic foot and nail care.    Review of Systems many systemic changes or findings that     Objective:   Physical Exam 63 year old [place] American female presents this time for diabetic foot and nail care having some pain in her heels and the recommendation for some Advil or ibuprofen maintain her shoes and replace and insoles in her shoes. At this time however patient has pedal pulses palpable DP postal for PT one over 4 thready at [facility] refill timed 3 seconds mild + edema noted epicritic and proprioceptive sensations intact although diminished on Semmes Weinstein testing to toes and distal digits and arch. Neurologically nails thick brittle friable discolored and criptotic 1 through 5 bilateral no open wounds ulcerations no secondary infections.       Assessment & Plan:  Assessment this time his diabetes with peripheral neuropathy dystrophic from criptotic nails 1 through 5 bilateral debridement at this time return for future palliative care and as-needed basis  Harriet Masson DPM

## 2014-03-12 NOTE — Patient Instructions (Signed)
Diabetes and Foot Care Diabetes may cause you to have problems because of poor blood supply (circulation) to your feet and legs. This may cause the skin on your feet to become thinner, break easier, and heal more slowly. Your skin may become dry, and the skin may peel and crack. You may also have nerve damage in your legs and feet causing decreased feeling in them. You may not notice minor injuries to your feet that could lead to infections or more serious problems. Taking care of your feet is one of the most important things you can do for yourself.  HOME CARE INSTRUCTIONS  Wear shoes at all times, even in the house. Do not go barefoot. Bare feet are easily injured.  Check your feet daily for blisters, cuts, and redness. If you cannot see the bottom of your feet, use a mirror or ask someone for help.  Wash your feet with warm water (do not use hot water) and mild soap. Then pat your feet and the areas between your toes until they are completely dry. Do not soak your feet as this can dry your skin.  Apply a moisturizing lotion or petroleum jelly (that does not contain alcohol and is unscented) to the skin on your feet and to dry, brittle toenails. Do not apply lotion between your toes.  Trim your toenails straight across. Do not dig under them or around the cuticle. File the edges of your nails with an emery board or nail file.  Do not cut corns or calluses or try to remove them with medicine.  Wear clean socks or stockings every day. Make sure they are not too tight. Do not wear knee-high stockings since they may decrease blood flow to your legs.  Wear shoes that fit properly and have enough cushioning. To break in new shoes, wear them for just a few hours a day. This prevents you from injuring your feet. Always look in your shoes before you put them on to be sure there are no objects inside.  Do not cross your legs. This may decrease the blood flow to your feet.  If you find a minor scrape,  cut, or break in the skin on your feet, keep it and the skin around it clean and dry. These areas may be cleansed with mild soap and water. Do not cleanse the area with peroxide, alcohol, or iodine.  When you remove an adhesive bandage, be sure not to damage the skin around it.  If you have a wound, look at it several times a day to make sure it is healing.  Do not use heating pads or hot water bottles. They may burn your skin. If you have lost feeling in your feet or legs, you may not know it is happening until it is too late.  Make sure your health care provider performs a complete foot exam at least annually or more often if you have foot problems. Report any cuts, sores, or bruises to your health care provider immediately. SEEK MEDICAL CARE IF:   You have an injury that is not healing.  You have cuts or breaks in the skin.  You have an ingrown nail.  You notice redness on your legs or feet.  You feel burning or tingling in your legs or feet.  You have pain or cramps in your legs and feet.  Your legs or feet are numb.  Your feet always feel cold. SEEK IMMEDIATE MEDICAL CARE IF:   There is increasing redness,   swelling, or pain in or around a wound.  There is a red line that goes up your leg.  Pus is coming from a wound.  You develop a fever or as directed by your health care provider.  You notice a bad smell coming from an ulcer or wound. Document Released: 11/12/2000 Document Revised: 07/18/2013 Document Reviewed: 04/24/2013 [employer] Patient Information 2014 [employer]  

## 2014-03-28 ENCOUNTER — Other Ambulatory Visit: Payer: Self-pay | Admitting: Family Medicine

## 2014-04-28 ENCOUNTER — Other Ambulatory Visit: Payer: Self-pay | Admitting: Family Medicine

## 2014-05-24 ENCOUNTER — Encounter: Payer: Self-pay | Admitting: Family Medicine

## 2014-05-27 ENCOUNTER — Emergency Department (HOSPITAL_COMMUNITY)
Admission: EM | Admit: 2014-05-27 | Discharge: 2014-05-27 | Disposition: A | Discharge status: Home / Self Care | Payer: PRIVATE HEALTH INSURANCE | Attending: Emergency Medicine | Admitting: Emergency Medicine

## 2014-05-27 ENCOUNTER — Encounter (HOSPITAL_COMMUNITY): Payer: Self-pay | Admitting: Emergency Medicine

## 2014-05-27 DIAGNOSIS — IMO0002 Reserved for concepts with insufficient information to code with codable children: Secondary | ICD-10-CM | POA: Diagnosis present

## 2014-05-27 DIAGNOSIS — Z862 Personal history of diseases of the blood and blood-forming organs and certain disorders involving the immune mechanism: Secondary | ICD-10-CM | POA: Insufficient documentation

## 2014-05-27 DIAGNOSIS — F3289 Other specified depressive episodes: Secondary | ICD-10-CM | POA: Insufficient documentation

## 2014-05-27 DIAGNOSIS — F329 Major depressive disorder, single episode, unspecified: Secondary | ICD-10-CM | POA: Diagnosis not present

## 2014-05-27 DIAGNOSIS — I509 Heart failure, unspecified: Secondary | ICD-10-CM | POA: Insufficient documentation

## 2014-05-27 DIAGNOSIS — Z79899 Other long term (current) drug therapy: Secondary | ICD-10-CM | POA: Diagnosis not present

## 2014-05-27 DIAGNOSIS — E119 Type 2 diabetes mellitus without complications: Secondary | ICD-10-CM | POA: Insufficient documentation

## 2014-05-27 DIAGNOSIS — Z872 Personal history of diseases of the skin and subcutaneous tissue: Secondary | ICD-10-CM | POA: Diagnosis not present

## 2014-05-27 DIAGNOSIS — Z87442 Personal history of urinary calculi: Secondary | ICD-10-CM | POA: Diagnosis not present

## 2014-05-27 DIAGNOSIS — E669 Obesity, unspecified: Secondary | ICD-10-CM | POA: Diagnosis not present

## 2014-05-27 DIAGNOSIS — Y9389 Activity, other specified: Secondary | ICD-10-CM | POA: Insufficient documentation

## 2014-05-27 DIAGNOSIS — S39012A Strain of muscle, fascia and tendon of lower back, initial encounter: Secondary | ICD-10-CM

## 2014-05-27 DIAGNOSIS — J45909 Unspecified asthma, uncomplicated: Secondary | ICD-10-CM | POA: Insufficient documentation

## 2014-05-27 DIAGNOSIS — K219 Gastro-esophageal reflux disease without esophagitis: Secondary | ICD-10-CM | POA: Insufficient documentation

## 2014-05-27 DIAGNOSIS — I10 Essential (primary) hypertension: Secondary | ICD-10-CM | POA: Diagnosis not present

## 2014-05-27 DIAGNOSIS — M129 Arthropathy, unspecified: Secondary | ICD-10-CM | POA: Insufficient documentation

## 2014-05-27 DIAGNOSIS — Y9289 Other specified places as the place of occurrence of the external cause: Secondary | ICD-10-CM | POA: Diagnosis not present

## 2014-05-27 DIAGNOSIS — X500XXA Overexertion from strenuous movement or load, initial encounter: Secondary | ICD-10-CM | POA: Insufficient documentation

## 2014-05-27 LAB — URINALYSIS, ROUTINE W REFLEX MICROSCOPIC
Bilirubin Urine: NEGATIVE
Glucose, UA: NEGATIVE mg/dL
Hgb urine dipstick: NEGATIVE
KETONES UR: NEGATIVE mg/dL
LEUKOCYTES UA: NEGATIVE
NITRITE: NEGATIVE
Protein, ur: NEGATIVE mg/dL
SPECIFIC GRAVITY, URINE: 1.012 (ref 1.005–1.030)
Urobilinogen, UA: 0.2 mg/dL (ref 0.0–1.0)
pH: 5 (ref 5.0–8.0)

## 2014-05-27 MED ORDER — OXYCODONE-ACETAMINOPHEN 5-325 MG PO TABS
1.0000 | ORAL_TABLET | Freq: Once | ORAL | Status: AC
  Administered 2014-05-27: 1 via ORAL
  Filled 2014-05-27: qty 1

## 2014-05-27 MED ORDER — CYCLOBENZAPRINE HCL 5 MG PO TABS: 5.0000 mg | ORAL_TABLET | Freq: Two times a day (BID) | ORAL | Status: DC | PRN

## 2014-05-27 MED ORDER — OXYCODONE-ACETAMINOPHEN 5-325 MG PO TABS: 1.0000 | ORAL_TABLET | Freq: Four times a day (QID) | ORAL | Status: DC | PRN

## 2014-05-27 MED ORDER — CYCLOBENZAPRINE HCL 10 MG PO TABS
5.0000 mg | ORAL_TABLET | Freq: Once | ORAL | Status: AC
  Administered 2014-05-27: 5 mg via ORAL
  Filled 2014-05-27: qty 1

## 2014-05-27 NOTE — Discharge Instructions (Signed)

## 2014-05-27 NOTE — ED Notes (Signed)
Pt arrived from home by [facility] with c/o mid thoracic back pain with radiation to sacrum area. Pain started last night with no relief of pain with the use of ibuprofen. 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 Increased pain with movement. Pt was unable to walk d/t pain with EMS but able to stand and pivot.

## 2014-05-27 NOTE — ED Notes (Signed)
Pt ambulated well in room but did not go far because of pain. Pt walked from bed to door and back to bed.

## 2014-05-27 NOTE — ED Provider Notes (Signed)
CSN: [id number]     Arrival date & time 05/27/14  1022 History   First MD Initiated Contact with Patient 05/27/14 1029     Chief Complaint  Patient presents with  . Back Pain     (Consider location/radiation/quality/duration/timing/severity/associated sxs/prior Treatment) Patient is a 63 y.o. female presenting with back pain.  Back Pain Location:  Thoracic spine Quality: sharp. Radiates to:  Does not radiate Pain severity:  Severe Onset quality:  Gradual Duration:  2 days Timing:  Constant Progression:  Worsening Chronicity:  New Context comment:  Lifting heavy objects Relieved by:  Nothing Worsened by:  Twisting, standing and movement Ineffective treatments: ibuprofen. Associated symptoms: no abdominal pain, no bladder incontinence, no bowel incontinence, no dysuria, no fever, no numbness, no paresthesias, no perianal numbness, no tingling and no weakness     Past Medical History  Diagnosis Date  . Hypertension   . Diabetes mellitus   . Depression   . Asthma   . Eczema   . Arthritis   . Cataract   . GERD (gastroesophageal reflux disease)   . History of kidney stones     w/ hx of hydronephrosis - followed by [facility]  . Anemia   . Obesity   . HIV nonspecific serology     2006: indeterminate HIV blood test, seen by ID, felt secondary to cross reacting antibodies with no further workup felt necessary at that time   . Fatty liver     Fatty infiltration of liver noted on 03/2012 CT scan  . CHF, acute 05/03/2012   Past Surgical History  Procedure Laterality Date  . Cystoscopy w/ litholapaxy / ehl    . Cholecystectomy  2003  . Replacement total knee bilateral  2005 &2006  . Joint replacement      bilateral knee replacement  . Vascular surgery Right 03/15/2013    Ultrasound guided sclerotherapy   Family History  Problem Relation Age of Onset  . Diabetes Mother   . Stroke Mother   . Heart disease Father   . Anemia Father    History  Substance Use Topics   . Smoking status: Never Smoker   . Smokeless tobacco: Never Used  . Alcohol Use: No   OB History   Grav Para Term Preterm Abortions TAB SAB Ect Mult Living                 Review of Systems  Constitutional: Negative for fever.  Gastrointestinal: Negative for abdominal pain and bowel incontinence.  Genitourinary: Negative for bladder incontinence and dysuria.  Musculoskeletal: Positive for back pain.  Neurological: Negative for tingling, weakness, numbness and paresthesias.  All other systems reviewed and are negative.     Allergies  Review of patient's allergies indicates no known allergies.  Home Medications   Prior to Admission medications   Medication Sig Start Date End Date Taking? Authorizing Provider  acetaminophen (TYLENOL) 500 MG tablet Take 1,000 mg by mouth every 6 (six) hours as needed for pain.    Historical Provider, MD  aspirin 81 MG tablet Take 81 mg by mouth daily.    Historical Provider, MD  benazepril (LOTENSIN) 20 MG tablet Take 1 tablet (20 mg total) by mouth daily. 08/27/13   Lind Covert, MD  clotrimazole-betamethasone (LOTRISONE) cream Apply 1 application topically 2 (two) times daily. 02/07/14   Harriet Masson, DPM  fish oil-omega-3 fatty acids 1000 MG capsule Take 1 g by mouth 2 (two) times daily.    Historical Provider, MD  fluticasone (FLOVENT HFA) 110 MCG/ACT inhaler Inhale 2 puffs into the lungs 2 (two) times daily. 12/03/13   Blane Ohara McDiarmid, MD  furosemide (LASIX) 40 MG tablet take 2 tablets by mouth daily    Lind Covert, MD  gabapentin (NEURONTIN) 300 MG capsule Two capsules by mouth three times a day 02/06/14   Harriet Masson, DPM  ibuprofen (ADVIL,MOTRIN) 600 MG tablet Take 600 mg by mouth every 8 (eight) hours as needed.    Historical Provider, MD  Lancets [facility] ULTRASOFT) lancets TEST ONCE DAILY AS DIRECTED 07/30/13   Blane Ohara McDiarmid, MD  metFORMIN (GLUCOPHAGE) 500 MG tablet Take 0.5 tablets (250 mg total) by mouth 2 (two)  times daily with a meal. 08/27/13   Lind Covert, MD  metoprolol tartrate (LOPRESSOR) 25 MG tablet take 1 tablet by mouth twice a day 05/30/13   Lind Covert, MD  ONE TOUCH ULTRA TEST test strip TEST BLOOD GLUCOSE ONCE DAILY 07/30/13   Blane Ohara McDiarmid, MD  oxyCODONE-acetaminophen (PERCOCET) 5-325 MG per tablet Take 1-2 tablets by mouth every 6 (six) hours as needed. 01/27/14   Sherrie George, PA-C  potassium citrate (UROCIT-K 10) 10 MEQ (1080 MG) SR tablet Take 10 mEq by mouth 2 (two) times daily.     Historical Provider, MD  PROAIR HFA 108 (90 BASE) MCG/ACT inhaler INHALE 2 PUFFS BY MOUTH EVERY 6 HOURS AS NEEDED FOR WHEEZING    Lind Covert, MD  ranitidine (ZANTAC) 150 MG tablet Take 150 mg by mouth at bedtime.    Historical Provider, MD  spironolactone (ALDACTONE) 25 MG tablet take 1 tablet by mouth once daily    Lind Covert, MD  trolamine salicylate (ASPERCREME) 10 % cream Apply 1 application topically daily. For shoulder and hip pain    Historical Provider, MD   BP 102/73  Pulse 70  Temp(Src) 97.5 F (36.4 C) (Oral)  Resp 16  SpO2 100% Physical Exam  Nursing note and vitals reviewed. Constitutional: She is oriented to person, place, and time. She appears well-developed and well-nourished. No distress.  HENT:  Head: Normocephalic and atraumatic.  Mouth/Throat: Oropharynx is clear and moist.  Eyes: Conjunctivae are normal. Pupils are equal, round, and reactive to light. No scleral icterus.  Neck: Neck supple.  Cardiovascular: Normal rate, regular rhythm, normal heart sounds and intact distal pulses.   No murmur heard. Pulmonary/Chest: Effort normal and breath sounds normal. No stridor. No respiratory distress. She has no rales.  Abdominal: Soft. Bowel sounds are normal. She exhibits no distension. There is no tenderness.  Musculoskeletal: Normal range of motion.       Thoracic back: She exhibits tenderness and bony tenderness.       Lumbar back: She  exhibits tenderness and bony tenderness.  Paraspinal muscles very tight  Neurological: She is alert and oriented to person, place, and time. She has normal strength.  Reflex Scores:      Patellar reflexes are 1+ on the right side and 1+ on the left side. Skin: Skin is warm and dry. No rash noted.  Psychiatric: She has a normal mood and affect. Her behavior is normal.    ED Course  Procedures (including critical care time) Labs Review Labs Reviewed  URINALYSIS, ROUTINE W REFLEX MICROSCOPIC    Imaging Review No results found.   EKG Interpretation None      MDM   Final diagnoses:  Back strain, initial encounter    63 yo female with back pain in setting  of heavy lifting.  Exam consistent with MSK back pain.  No red flag signs or symptoms to suggest spinal cord or nerve compression.  Able to ambulate.  Better after percocet and flexeril.  dc'd home with same.       Houston Siren III, MD 05/27/14 [phone]

## 2014-05-30 ENCOUNTER — Ambulatory Visit (INDEPENDENT_AMBULATORY_CARE_PROVIDER_SITE_OTHER): Payer: PRIVATE HEALTH INSURANCE | Admitting: Family Medicine

## 2014-05-30 ENCOUNTER — Encounter: Payer: Self-pay | Admitting: Family Medicine

## 2014-05-30 VITALS — BP 118/69 | HR 65 | Temp 97.5°F | Resp 16 | Wt 232.0 lb

## 2014-05-30 DIAGNOSIS — R251 Tremor, unspecified: Secondary | ICD-10-CM

## 2014-05-30 DIAGNOSIS — R259 Unspecified abnormal involuntary movements: Secondary | ICD-10-CM

## 2014-05-30 DIAGNOSIS — M549 Dorsalgia, unspecified: Secondary | ICD-10-CM | POA: Insufficient documentation

## 2014-05-30 DIAGNOSIS — M546 Pain in thoracic spine: Secondary | ICD-10-CM

## 2014-05-30 MED ORDER — BACLOFEN 10 MG PO TABS: 10.0000 mg | ORAL_TABLET | Freq: Three times a day (TID) | ORAL | Status: DC | PRN

## 2014-05-30 NOTE — Assessment & Plan Note (Signed)
Patient with tremor of right hand at rest and on intention coinciding with onset of back pain. No shuffling gait or masked facies to indicate parkinsons. Potentially related to back pain. Will continue to monitor. If not improved with resolution of back pain, may need to consider further work-up.

## 2014-05-30 NOTE — Progress Notes (Signed)
Patient ID: [name], female   DOB: 01-Mar-1951, 63 y.o.   MRN: [id number]  Tommi Rumps, MD Phone: [phone]  JARETZY LHOMMEDIEU is a 63 y.o. female who presents today for same day appointment.  Back pain: upper back pain. Started on Monday after she was lifting herself out of bed. Went to the ED and diagnosed with spasm of back muscles. Given flexeril and percocet with some relief in the ED and sent home with these. Has continued to have pain despite these medications. Only taking 200 mg of ibuprofen at a time. Denies weakness, numbness, fevers, history of cancer, changes in bowel or bladder habits, and saddle anesthesia. Has not tried heat for this.  Tremor: notes this started after the back pain. Is intermittent. Has never had before. No changes in gait. No other neurological deficits.  Patient is a nonsmoker.   ROS: Per HPI   Physical Exam Filed Vitals:   05/30/14 1031  BP: 118/69  Pulse: 65  Temp: 97.5 F (36.4 C)  Resp: 16    Gen: Well NAD HEENT: PERRL,  MMM Lungs: CTABL Nl WOB Heart: RRR no MRG MSK: bilateral thoracic paraspinous muscles with tenderness to palpation, no midline tenderness, no palpable muscle spasm Neuro: CN 2-12 intact, 5/5 strength in bilateral biceps, triceps, grip, quads, hamstrings, plantar and dorsi flexion, sensation to light touch in tact in bilateral UE and LE, 2+ brachioradialis, biceps, and achilles reflexes, resting tremor intermittently noted on right hand and present with intention, gait with walker is limited by pain Exts: Non edematous BL  LE, warm and well perfused.    Assessment/Plan: Please see individual problem list.  # Healthcare maintenance: not addressed

## 2014-05-30 NOTE — Assessment & Plan Note (Addendum)
Patient with apparent muscle strain and likely intermittent spasm in thoracic paraspinous muscles. No red flags on history or exam. Not relieved by previously tried interventions. Will change to baclofen given her age. D/c percocet. Start ibuprofen 600 mg q6 hours for 2 days then q6 hrs as needed (note patient with most recent EF 55-60%). Patient to get heating pad to use as directed in AVS. Given return precautions. F/u in 2 weeks or sooner if not improving.

## 2014-05-30 NOTE — Patient Instructions (Signed)
Nice to meet you. Your back pain is likely due to a muscle spasm. We will change you to a different muscle relaxer today.  You should use ibuprofen 600 mg every 6 hours for the next 2 days and then as needed.  You should also use heat for 15 minutes at a time 3-4 times per day. If you develop weakness, numbness, change in bowel or bladder function, or fever please let us know.

## 2014-06-01 ENCOUNTER — Other Ambulatory Visit: Payer: Self-pay

## 2014-06-01 ENCOUNTER — Inpatient Hospital Stay (HOSPITAL_COMMUNITY)
Admission: EM | Admit: 2014-06-01 | Discharge: 2014-06-06 | DRG: 917 | Disposition: A | Discharge status: Skilled Nursing Facility | Payer: PRIVATE HEALTH INSURANCE | Attending: Family Medicine | Admitting: Family Medicine

## 2014-06-01 ENCOUNTER — Emergency Department (HOSPITAL_COMMUNITY): Payer: PRIVATE HEALTH INSURANCE

## 2014-06-01 ENCOUNTER — Encounter (HOSPITAL_COMMUNITY): Payer: Self-pay | Admitting: Emergency Medicine

## 2014-06-01 DIAGNOSIS — E1340 Other specified diabetes mellitus with diabetic neuropathy, unspecified: Secondary | ICD-10-CM | POA: Diagnosis present

## 2014-06-01 DIAGNOSIS — F3289 Other specified depressive episodes: Secondary | ICD-10-CM

## 2014-06-01 DIAGNOSIS — G929 Unspecified toxic encephalopathy: Secondary | ICD-10-CM | POA: Diagnosis present

## 2014-06-01 DIAGNOSIS — D649 Anemia, unspecified: Secondary | ICD-10-CM | POA: Diagnosis present

## 2014-06-01 DIAGNOSIS — Z7982 Long term (current) use of aspirin: Secondary | ICD-10-CM

## 2014-06-01 DIAGNOSIS — M545 Low back pain, unspecified: Secondary | ICD-10-CM

## 2014-06-01 DIAGNOSIS — E669 Obesity, unspecified: Secondary | ICD-10-CM

## 2014-06-01 DIAGNOSIS — R410 Disorientation, unspecified: Secondary | ICD-10-CM

## 2014-06-01 DIAGNOSIS — J45909 Unspecified asthma, uncomplicated: Secondary | ICD-10-CM | POA: Diagnosis present

## 2014-06-01 DIAGNOSIS — Z79899 Other long term (current) drug therapy: Secondary | ICD-10-CM

## 2014-06-01 DIAGNOSIS — N179 Acute kidney failure, unspecified: Secondary | ICD-10-CM | POA: Diagnosis present

## 2014-06-01 DIAGNOSIS — M109 Gout, unspecified: Secondary | ICD-10-CM | POA: Diagnosis not present

## 2014-06-01 DIAGNOSIS — M25521 Pain in right elbow: Secondary | ICD-10-CM

## 2014-06-01 DIAGNOSIS — I5042 Chronic combined systolic (congestive) and diastolic (congestive) heart failure: Secondary | ICD-10-CM | POA: Diagnosis present

## 2014-06-01 DIAGNOSIS — M1990 Unspecified osteoarthritis, unspecified site: Secondary | ICD-10-CM

## 2014-06-01 DIAGNOSIS — R5381 Other malaise: Secondary | ICD-10-CM | POA: Diagnosis not present

## 2014-06-01 DIAGNOSIS — E119 Type 2 diabetes mellitus without complications: Secondary | ICD-10-CM

## 2014-06-01 DIAGNOSIS — Z96659 Presence of unspecified artificial knee joint: Secondary | ICD-10-CM

## 2014-06-01 DIAGNOSIS — M21969 Unspecified acquired deformity of unspecified lower leg: Secondary | ICD-10-CM

## 2014-06-01 DIAGNOSIS — I10 Essential (primary) hypertension: Secondary | ICD-10-CM

## 2014-06-01 DIAGNOSIS — E1169 Type 2 diabetes mellitus with other specified complication: Secondary | ICD-10-CM

## 2014-06-01 DIAGNOSIS — G609 Hereditary and idiopathic neuropathy, unspecified: Secondary | ICD-10-CM | POA: Diagnosis present

## 2014-06-01 DIAGNOSIS — R4182 Altered mental status, unspecified: Secondary | ICD-10-CM

## 2014-06-01 DIAGNOSIS — T48201A Poisoning by unspecified drugs acting on muscles, accidental (unintentional), initial encounter: Secondary | ICD-10-CM | POA: Diagnosis present

## 2014-06-01 DIAGNOSIS — T481X4A Poisoning by skeletal muscle relaxants [neuromuscular blocking agents], undetermined, initial encounter: Principal | ICD-10-CM | POA: Diagnosis present

## 2014-06-01 DIAGNOSIS — R531 Weakness: Secondary | ICD-10-CM

## 2014-06-01 DIAGNOSIS — G92 Toxic encephalopathy: Secondary | ICD-10-CM | POA: Diagnosis present

## 2014-06-01 DIAGNOSIS — E1142 Type 2 diabetes mellitus with diabetic polyneuropathy: Secondary | ICD-10-CM

## 2014-06-01 DIAGNOSIS — R251 Tremor, unspecified: Secondary | ICD-10-CM

## 2014-06-01 DIAGNOSIS — K219 Gastro-esophageal reflux disease without esophagitis: Secondary | ICD-10-CM | POA: Diagnosis present

## 2014-06-01 DIAGNOSIS — I5022 Chronic systolic (congestive) heart failure: Secondary | ICD-10-CM

## 2014-06-01 DIAGNOSIS — K7689 Other specified diseases of liver: Secondary | ICD-10-CM | POA: Diagnosis present

## 2014-06-01 DIAGNOSIS — I509 Heart failure, unspecified: Secondary | ICD-10-CM | POA: Diagnosis present

## 2014-06-01 DIAGNOSIS — M159 Polyosteoarthritis, unspecified: Secondary | ICD-10-CM | POA: Diagnosis present

## 2014-06-01 DIAGNOSIS — F329 Major depressive disorder, single episode, unspecified: Secondary | ICD-10-CM

## 2014-06-01 DIAGNOSIS — M10421 Other secondary gout, right elbow: Secondary | ICD-10-CM

## 2014-06-01 DIAGNOSIS — R339 Retention of urine, unspecified: Secondary | ICD-10-CM | POA: Diagnosis not present

## 2014-06-01 DIAGNOSIS — E875 Hyperkalemia: Secondary | ICD-10-CM | POA: Diagnosis present

## 2014-06-01 DIAGNOSIS — G9340 Encephalopathy, unspecified: Secondary | ICD-10-CM

## 2014-06-01 LAB — COMPREHENSIVE METABOLIC PANEL
ALBUMIN: 3.2 g/dL — AB (ref 3.5–5.2)
ALK PHOS: 68 U/L (ref 39–117)
ALT: 8 U/L (ref 0–35)
AST: 17 U/L (ref 0–37)
Anion gap: 17 — ABNORMAL HIGH (ref 5–15)
BILIRUBIN TOTAL: 0.3 mg/dL (ref 0.3–1.2)
BUN: 70 mg/dL — AB (ref 6–23)
CO2: 25 mEq/L (ref 19–32)
Calcium: 9.7 mg/dL (ref 8.4–10.5)
Chloride: 100 mEq/L (ref 96–112)
Creatinine, Ser: 1.95 mg/dL — ABNORMAL HIGH (ref 0.50–1.10)
GFR calc Af Amer: 30 mL/min — ABNORMAL LOW (ref >90)
GFR calc non Af Amer: 26 mL/min — ABNORMAL LOW (ref >90)
Glucose, Bld: 188 mg/dL — ABNORMAL HIGH (ref 70–99)
POTASSIUM: 5.9 meq/L — AB (ref 3.7–5.3)
SODIUM: 142 meq/L (ref 137–147)
Total Protein: 8.1 g/dL (ref 6.0–8.3)

## 2014-06-01 LAB — CBC WITH DIFFERENTIAL/PLATELET
BASOS PCT: 0 % (ref 0–1)
Basophils Absolute: 0 10*3/uL (ref 0.0–0.1)
Eosinophils Absolute: 0.1 10*3/uL (ref 0.0–0.7)
Eosinophils Relative: 1 % (ref 0–5)
HCT: 28.6 % — ABNORMAL LOW (ref 36.0–46.0)
Hemoglobin: 9 g/dL — ABNORMAL LOW (ref 12.0–15.0)
Lymphocytes Relative: 19 % (ref 12–46)
Lymphs Abs: 2.7 10*3/uL (ref 0.7–4.0)
MCH: 28.3 pg (ref 26.0–34.0)
MCHC: 31.5 g/dL (ref 30.0–36.0)
MCV: 89.9 fL (ref 78.0–100.0)
MONOS PCT: 5 % (ref 3–12)
Monocytes Absolute: 0.8 10*3/uL (ref 0.1–1.0)
NEUTROS ABS: 10.9 10*3/uL — AB (ref 1.7–7.7)
NEUTROS PCT: 75 % (ref 43–77)
PLATELETS: 328 10*3/uL (ref 150–400)
RBC: 3.18 MIL/uL — AB (ref 3.87–5.11)
RDW: 14.2 % (ref 11.5–15.5)
WBC: 14.5 10*3/uL — ABNORMAL HIGH (ref 4.0–10.5)

## 2014-06-01 LAB — URINALYSIS, ROUTINE W REFLEX MICROSCOPIC
Bilirubin Urine: NEGATIVE
GLUCOSE, UA: NEGATIVE mg/dL
HGB URINE DIPSTICK: NEGATIVE
KETONES UR: NEGATIVE mg/dL
Leukocytes, UA: NEGATIVE
Nitrite: NEGATIVE
Protein, ur: NEGATIVE mg/dL
SPECIFIC GRAVITY, URINE: 1.016 (ref 1.005–1.030)
Urobilinogen, UA: 0.2 mg/dL (ref 0.0–1.0)
pH: 5 (ref 5.0–8.0)

## 2014-06-01 LAB — RAPID URINE DRUG SCREEN, HOSP PERFORMED
AMPHETAMINES: NOT DETECTED
BENZODIAZEPINES: NOT DETECTED
Barbiturates: NOT DETECTED
Cocaine: NOT DETECTED
OPIATES: NOT DETECTED
Tetrahydrocannabinol: NOT DETECTED

## 2014-06-01 LAB — CBG MONITORING, ED: Glucose-Capillary: 176 mg/dL — ABNORMAL HIGH (ref 70–99)

## 2014-06-01 LAB — ETHANOL

## 2014-06-01 LAB — I-STAT CG4 LACTIC ACID, ED: LACTIC ACID, VENOUS: 1.1 mmol/L (ref 0.5–2.2)

## 2014-06-01 LAB — SALICYLATE LEVEL: Salicylate Lvl: <2 mg/dL — ABNORMAL LOW (ref 2.8–20.0)

## 2014-06-01 LAB — ACETAMINOPHEN LEVEL: Acetaminophen (Tylenol), Serum: <15 ug/mL (ref 10–30)

## 2014-06-01 LAB — AMMONIA: Ammonia: 28 umol/L (ref 11–60)

## 2014-06-01 LAB — I-STAT TROPONIN, ED: Troponin i, poc: 0 ng/mL (ref 0.00–0.08)

## 2014-06-01 MED ORDER — NALOXONE HCL 0.4 MG/ML IJ SOLN
0.4000 mg | Freq: Once | INTRAMUSCULAR | Status: AC
  Administered 2014-06-02: 0.4 mg via INTRAVENOUS
  Filled 2014-06-01: qty 1

## 2014-06-01 NOTE — ED Notes (Signed)
Per EMS pt was last seen normal at 1900. Pt woke up and "wasn't feeling right". Pt family member states that he came back over to the house and found the pt this way, around 2130. Pt is alert, disoriented with slurred speech. Pt with tremors.

## 2014-06-02 ENCOUNTER — Encounter (HOSPITAL_COMMUNITY): Payer: Self-pay

## 2014-06-02 DIAGNOSIS — E669 Obesity, unspecified: Secondary | ICD-10-CM | POA: Diagnosis present

## 2014-06-02 DIAGNOSIS — I10 Essential (primary) hypertension: Secondary | ICD-10-CM | POA: Diagnosis present

## 2014-06-02 DIAGNOSIS — G92 Toxic encephalopathy: Secondary | ICD-10-CM | POA: Diagnosis present

## 2014-06-02 DIAGNOSIS — M21969 Unspecified acquired deformity of unspecified lower leg: Secondary | ICD-10-CM

## 2014-06-02 DIAGNOSIS — K219 Gastro-esophageal reflux disease without esophagitis: Secondary | ICD-10-CM | POA: Diagnosis present

## 2014-06-02 DIAGNOSIS — K7689 Other specified diseases of liver: Secondary | ICD-10-CM | POA: Diagnosis present

## 2014-06-02 DIAGNOSIS — R259 Unspecified abnormal involuntary movements: Secondary | ICD-10-CM

## 2014-06-02 DIAGNOSIS — I5042 Chronic combined systolic (congestive) and diastolic (congestive) heart failure: Secondary | ICD-10-CM | POA: Diagnosis present

## 2014-06-02 DIAGNOSIS — G9340 Encephalopathy, unspecified: Secondary | ICD-10-CM | POA: Diagnosis present

## 2014-06-02 DIAGNOSIS — T48201A Poisoning by unspecified drugs acting on muscles, accidental (unintentional), initial encounter: Secondary | ICD-10-CM | POA: Diagnosis present

## 2014-06-02 DIAGNOSIS — D649 Anemia, unspecified: Secondary | ICD-10-CM | POA: Diagnosis present

## 2014-06-02 DIAGNOSIS — R4182 Altered mental status, unspecified: Secondary | ICD-10-CM | POA: Diagnosis present

## 2014-06-02 DIAGNOSIS — J45909 Unspecified asthma, uncomplicated: Secondary | ICD-10-CM | POA: Diagnosis present

## 2014-06-02 DIAGNOSIS — T481X4A Poisoning by skeletal muscle relaxants [neuromuscular blocking agents], undetermined, initial encounter: Secondary | ICD-10-CM | POA: Diagnosis present

## 2014-06-02 DIAGNOSIS — E119 Type 2 diabetes mellitus without complications: Secondary | ICD-10-CM | POA: Diagnosis present

## 2014-06-02 DIAGNOSIS — M159 Polyosteoarthritis, unspecified: Secondary | ICD-10-CM | POA: Diagnosis present

## 2014-06-02 DIAGNOSIS — E875 Hyperkalemia: Secondary | ICD-10-CM | POA: Diagnosis present

## 2014-06-02 DIAGNOSIS — M109 Gout, unspecified: Secondary | ICD-10-CM | POA: Diagnosis not present

## 2014-06-02 DIAGNOSIS — R339 Retention of urine, unspecified: Secondary | ICD-10-CM | POA: Diagnosis not present

## 2014-06-02 DIAGNOSIS — E1169 Type 2 diabetes mellitus with other specified complication: Secondary | ICD-10-CM

## 2014-06-02 DIAGNOSIS — N179 Acute kidney failure, unspecified: Secondary | ICD-10-CM | POA: Diagnosis present

## 2014-06-02 DIAGNOSIS — G609 Hereditary and idiopathic neuropathy, unspecified: Secondary | ICD-10-CM | POA: Diagnosis present

## 2014-06-02 DIAGNOSIS — Z7982 Long term (current) use of aspirin: Secondary | ICD-10-CM | POA: Diagnosis not present

## 2014-06-02 DIAGNOSIS — G929 Unspecified toxic encephalopathy: Secondary | ICD-10-CM | POA: Diagnosis present

## 2014-06-02 DIAGNOSIS — I509 Heart failure, unspecified: Secondary | ICD-10-CM | POA: Diagnosis present

## 2014-06-02 DIAGNOSIS — R5381 Other malaise: Secondary | ICD-10-CM | POA: Diagnosis not present

## 2014-06-02 DIAGNOSIS — Z96659 Presence of unspecified artificial knee joint: Secondary | ICD-10-CM | POA: Diagnosis not present

## 2014-06-02 DIAGNOSIS — Z79899 Other long term (current) drug therapy: Secondary | ICD-10-CM | POA: Diagnosis not present

## 2014-06-02 LAB — BASIC METABOLIC PANEL
Anion gap: 16 — ABNORMAL HIGH (ref 5–15)
Anion gap: 13 (ref 5–15)
BUN: 67 mg/dL — ABNORMAL HIGH (ref 6–23)
BUN: 43 mg/dL — AB (ref 6–23)
CO2: 25 mEq/L (ref 19–32)
CO2: 26 mEq/L (ref 19–32)
Calcium: 9.5 mg/dL (ref 8.4–10.5)
Calcium: 9.2 mg/dL (ref 8.4–10.5)
Chloride: 103 mEq/L (ref 96–112)
Chloride: 105 mEq/L (ref 96–112)
Creatinine, Ser: 1.89 mg/dL — ABNORMAL HIGH (ref 0.50–1.10)
Creatinine, Ser: 1.26 mg/dL — ABNORMAL HIGH (ref 0.50–1.10)
GFR calc Af Amer: 51 mL/min — ABNORMAL LOW (ref >90)
GFR calc non Af Amer: 27 mL/min — ABNORMAL LOW (ref >90)
GFR, EST AFRICAN AMERICAN: 31 mL/min — AB (ref >90)
GFR, EST NON AFRICAN AMERICAN: 44 mL/min — AB (ref >90)
Glucose, Bld: 178 mg/dL — ABNORMAL HIGH (ref 70–99)
Glucose, Bld: 173 mg/dL — ABNORMAL HIGH (ref 70–99)
POTASSIUM: 5.1 meq/L (ref 3.7–5.3)
POTASSIUM: 4.4 meq/L (ref 3.7–5.3)
SODIUM: 144 meq/L (ref 137–147)
SODIUM: 144 meq/L (ref 137–147)

## 2014-06-02 LAB — I-STAT CHEM 8, ED
BUN: 74 mg/dL — ABNORMAL HIGH (ref 6–23)
CALCIUM ION: 1.16 mmol/L (ref 1.13–1.30)
Chloride: 108 mEq/L (ref 96–112)
Creatinine, Ser: 2.1 mg/dL — ABNORMAL HIGH (ref 0.50–1.10)
GLUCOSE: 180 mg/dL — AB (ref 70–99)
HCT: 29 % — ABNORMAL LOW (ref 36.0–46.0)
HEMOGLOBIN: 9.9 g/dL — AB (ref 12.0–15.0)
Potassium: 5.3 mEq/L (ref 3.7–5.3)
SODIUM: 142 meq/L (ref 137–147)
TCO2: 25 mmol/L (ref 0–100)

## 2014-06-02 LAB — I-STAT ARTERIAL BLOOD GAS, ED
Acid-Base Excess: 1 mmol/L (ref 0.0–2.0)
Bicarbonate: 27.1 mEq/L — ABNORMAL HIGH (ref 20.0–24.0)
O2 Saturation: 98 %
PH ART: 7.357 (ref 7.350–7.450)
Patient temperature: 98.7
TCO2: 29 mmol/L (ref 0–100)
pCO2 arterial: 48.4 mmHg — ABNORMAL HIGH (ref 35.0–45.0)
pO2, Arterial: 108 mmHg — ABNORMAL HIGH (ref 80.0–100.0)

## 2014-06-02 LAB — CBC
HCT: 28.9 % — ABNORMAL LOW (ref 36.0–46.0)
Hemoglobin: 8.8 g/dL — ABNORMAL LOW (ref 12.0–15.0)
MCH: 27.7 pg (ref 26.0–34.0)
MCHC: 30.4 g/dL (ref 30.0–36.0)
MCV: 90.9 fL (ref 78.0–100.0)
Platelets: 310 10*3/uL (ref 150–400)
RBC: 3.18 MIL/uL — AB (ref 3.87–5.11)
RDW: 14.3 % (ref 11.5–15.5)
WBC: 13.8 10*3/uL — AB (ref 4.0–10.5)

## 2014-06-02 LAB — GLUCOSE, CAPILLARY
GLUCOSE-CAPILLARY: 178 mg/dL — AB (ref 70–99)
GLUCOSE-CAPILLARY: 123 mg/dL — AB (ref 70–99)
Glucose-Capillary: 127 mg/dL — ABNORMAL HIGH (ref 70–99)
Glucose-Capillary: 162 mg/dL — ABNORMAL HIGH (ref 70–99)
Glucose-Capillary: 140 mg/dL — ABNORMAL HIGH (ref 70–99)
Glucose-Capillary: 151 mg/dL — ABNORMAL HIGH (ref 70–99)

## 2014-06-02 LAB — MRSA PCR SCREENING: MRSA by PCR: NEGATIVE

## 2014-06-02 LAB — POTASSIUM: Potassium: 6 mEq/L — ABNORMAL HIGH (ref 3.7–5.3)

## 2014-06-02 MED ORDER — SODIUM CHLORIDE 0.9 % IV SOLN: INTRAVENOUS | Status: AC

## 2014-06-02 MED ORDER — IOHEXOL 300 MG/ML  SOLN
25.0000 mL | INTRAMUSCULAR | Status: AC
  Administered 2014-06-02 (×2): 25 mL via ORAL

## 2014-06-02 MED ORDER — HEPARIN SODIUM (PORCINE) 5000 UNIT/ML IJ SOLN
5000.0000 [IU] | Freq: Three times a day (TID) | INTRAMUSCULAR | Status: DC
  Administered 2014-06-02 – 2014-06-06 (×12): 5000 [IU] via SUBCUTANEOUS
  Filled 2014-06-02 (×16): qty 1

## 2014-06-02 MED ORDER — METOPROLOL TARTRATE 25 MG PO TABS
25.0000 mg | ORAL_TABLET | Freq: Two times a day (BID) | ORAL | Status: DC
  Administered 2014-06-03 – 2014-06-06 (×6): 25 mg via ORAL
  Filled 2014-06-02 (×8): qty 1

## 2014-06-02 MED ORDER — SODIUM CHLORIDE 0.9 % IV SOLN
INTRAVENOUS | Status: AC
  Administered 2014-06-02: 75 mL via INTRAVENOUS

## 2014-06-02 MED ORDER — SODIUM CHLORIDE 0.9 % IV SOLN
INTRAVENOUS | Status: DC
  Administered 2014-06-02 – 2014-06-03 (×2): via INTRAVENOUS

## 2014-06-02 MED ORDER — SODIUM CHLORIDE 0.9 % IV BOLUS (SEPSIS)
1000.0000 mL | Freq: Once | INTRAVENOUS | Status: AC
  Administered 2014-06-02: 1000 mL via INTRAVENOUS

## 2014-06-02 MED ORDER — HYDRALAZINE HCL 10 MG PO TABS
5.0000 mg | ORAL_TABLET | ORAL | Status: DC | PRN
  Administered 2014-06-02: 5 mg via ORAL
  Administered 2014-06-03: 03:00:00 via ORAL
  Filled 2014-06-02 (×3): qty 1

## 2014-06-02 MED ORDER — ACETAMINOPHEN 325 MG PO TABS
650.0000 mg | ORAL_TABLET | Freq: Four times a day (QID) | ORAL | Status: DC | PRN
  Administered 2014-06-02 – 2014-06-06 (×3): 650 mg via ORAL
  Filled 2014-06-02 (×3): qty 2

## 2014-06-02 MED ORDER — SODIUM CHLORIDE 0.9 % IJ SOLN
3.0000 mL | Freq: Two times a day (BID) | INTRAMUSCULAR | Status: DC
  Administered 2014-06-02 – 2014-06-06 (×5): 3 mL via INTRAVENOUS

## 2014-06-02 MED ORDER — INSULIN ASPART 100 UNIT/ML ~~LOC~~ SOLN
0.0000 [IU] | Freq: Every day | SUBCUTANEOUS | Status: DC
  Administered 2014-06-04: 2 [IU] via SUBCUTANEOUS

## 2014-06-02 MED ORDER — INSULIN ASPART 100 UNIT/ML ~~LOC~~ SOLN
0.0000 [IU] | Freq: Three times a day (TID) | SUBCUTANEOUS | Status: DC
  Administered 2014-06-02: 1 [IU] via SUBCUTANEOUS
  Administered 2014-06-02 – 2014-06-03 (×3): 2 [IU] via SUBCUTANEOUS
  Administered 2014-06-03: 1 [IU] via SUBCUTANEOUS
  Administered 2014-06-04: 3 [IU] via SUBCUTANEOUS
  Administered 2014-06-04 (×2): 2 [IU] via SUBCUTANEOUS
  Administered 2014-06-05: 3 [IU] via SUBCUTANEOUS
  Administered 2014-06-05: 2 [IU] via SUBCUTANEOUS
  Administered 2014-06-05 – 2014-06-06 (×2): 3 [IU] via SUBCUTANEOUS
  Administered 2014-06-06: 5 [IU] via SUBCUTANEOUS

## 2014-06-02 NOTE — ED Notes (Signed)
KVO fluids started in the right AC.

## 2014-06-02 NOTE — H&P (Signed)
[facility] Admission History and Physical Service Pager: [phone]  Patient name: [name] Medical record number: [id number] Date of birth: [birth year] Age: 63 y.o. Gender: female  Primary Care Provider: Lind Covert, MD Consultants: None Code Status: Full  Chief Complaint: altered mental;status, jerking  Assessment and Plan: [name] is a 63 y.o. female presenting with altered mental status . PMH is significant for DM2, HTN,OA, combined systolic/diastolic CHF, Depression, and Asthma.    Acute encephalopathy - Admit to step down unit with telemetry - Unclear etiology, differential diagnosis includes drug-induced, dehydration, metabolic, CVA, infection - Hemodynamically stable with ABG showing reasonable ventilation, lactic acid 1.1 - Responded slightly to Narcan injection but her UDS was negative - Tylenol and salicylate level within normal limits, glucose 188, UA negative, ammonia 28 - Consider MRI brain to evaluate for stroke or other acute intracranial process, CT head with no acute findings - Blood cultures  Hyperkalemia - Previous measurement of 5.9 hemolyzed, repeat was 5.3 - Follow closely with BMP and 4 hours - Hold spironolactone and lisinopril - Possibly dehydration related  Acute kidney injury - BUN/creatinine ratio of 35 indicates likely prerenal - baseline cre 1.0-1.3 - Tacky mucous membranes - Bolus NS x1 L, 100 mils per hour after that x 8 hours  DM2 - Previous A1c 6.1 on 12/31/2013 - SSI while admitted - On metformin at home, hold with AKI and through the rest of admission  HTN - 150s over 80s during exam - Considering her mental status she cannot take by mouth as of this time - Hold Lasix, benazepril, metoprolol, spironolactone - When necessary hydralazine or scheduled Vasotec as necessary  Normocytic anemia - No obvious source of bleeding - possible anemia of chronic disease but no obvious chronic  inflammatory process - monitor, will likely be lower after hydration.   Combined CHF - Slight edema on exam, tacky mucous membranes, BUN/creatinine ratio of 35 - Previous echo 2013 with EF 20-25 and grade 2 diastolic dysf, Repeat 51/8841 with EF 55-60 and grade 1 diastolic dysf - Bolus x1 L NS cautiously  Asthma - Lungs clear exam, albuterol and home Flovent when her mental status improves  OA - Will discuss pain medications when her mental status improves - Hold opiates and muscle relaxants for now  FEN/GI: Bolus X 1 L NS, 100/hr X 12 hours after that  Prophylaxis: Heparin  Disposition: Step down for clode monitoring  History of Present Illness: [name] is a 63 y.o. female presenting with  Altered metal status. Her son reports that she was last seen normal around 7 PM tonight. He went milligrams and came back around 9 PM and his sister told him that his mother was unresponsive. EMS was called she was brought in. He states that she said that she take medications and he is suspicious that she take medications once earlier, forgot, and took the medicines again. A  new medication recently added was baclofen, however she was taking this for about 3 days without any side effects.   She was seen earlier in the week in the ER for back pain and given Flexeril and Percocet which did not resolve her pain. She followed up with her PCPs clinic with those are discontinued and she was started on baclofen. Noted to have a right hand tremor at that time which started with the onset of the back pain. Her son states that the hand tremor stopped tonight which she got to the hospital. According  to her son she has been afebrile and generally feeling well except for her recent back pain. She's been eating and drinking as normal rate but has been eating more fast food lately.   Review Of Systems: Per HPI with the following additions: Per HPI, Otherwise 12 point review of systems was performed and was  unremarkable.  Patient Active Problem List   Diagnosis Date Noted  . Altered mental status 06/02/2014  . Back pain 05/30/2014  . Tremor 05/30/2014  . Type 2 diabetes mellitus with diabetic foot deformity 01/01/2014  . Systolic CHF, chronic 93/79/0240  . OBESITY 02/05/2009  . ASTHMA, PERSISTENT, MILD 02/08/2008  . DIABETES MELLITUS 05/18/2007  . DISORDER, DEPRESSIVE NEC 05/18/2007  . HYPERTENSION, BENIGN SYSTEMIC 01/26/2007  . NEPHROLITHIASIS 01/26/2007  . OSTEOARTHRITIS, MULTI SITES 01/26/2007   Past Medical History: Past Medical History  Diagnosis Date  . Hypertension   . Diabetes mellitus   . Depression   . Asthma   . Eczema   . Arthritis   . Cataract   . GERD (gastroesophageal reflux disease)   . History of kidney stones     w/ hx of hydronephrosis - followed by [facility]  . Anemia   . Obesity   . HIV nonspecific serology     2006: indeterminate HIV blood test, seen by ID, felt secondary to cross reacting antibodies with no further workup felt necessary at that time   . Fatty liver     Fatty infiltration of liver noted on 03/2012 CT scan  . CHF, acute 05/03/2012   Past Surgical History: Past Surgical History  Procedure Laterality Date  . Cystoscopy w/ litholapaxy / ehl    . Cholecystectomy  2003  . Replacement total knee bilateral  2005 &2006  . Joint replacement      bilateral knee replacement  . Vascular surgery Right 03/15/2013    Ultrasound guided sclerotherapy   Social History: History  Substance Use Topics  . Smoking status: Never Smoker   . Smokeless tobacco: Never Used  . Alcohol Use: No   Additional social history: Please also refer to relevant sections of EMR.  Family History: Family History  Problem Relation Age of Onset  . Diabetes Mother   . Stroke Mother   . Heart disease Father   . Anemia Father    Allergies and Medications: No Known Allergies No current facility-administered medications on file prior to encounter.   Current  Outpatient Prescriptions on File Prior to Encounter  Medication Sig Dispense Refill  . acetaminophen (TYLENOL) 500 MG tablet Take 1,000 mg by mouth every 6 (six) hours as needed for pain.      Marland Kitchen albuterol (PROVENTIL HFA;VENTOLIN HFA) 108 (90 BASE) MCG/ACT inhaler Inhale 2 puffs into the lungs every 6 (six) hours as needed for wheezing or shortness of breath.      Marland Kitchen aspirin 81 MG tablet Take 81 mg by mouth daily.      . baclofen (LIORESAL) 10 MG tablet Take 1 tablet (10 mg total) by mouth 3 (three) times daily as needed for muscle spasms.  30 each  0  . benazepril (LOTENSIN) 20 MG tablet Take 1 tablet (20 mg total) by mouth daily.  90 tablet  3  . fish oil-omega-3 fatty acids 1000 MG capsule Take 1 g by mouth 2 (two) times daily.      . fluticasone (FLOVENT HFA) 110 MCG/ACT inhaler Inhale 2 puffs into the lungs 2 (two) times daily.  12 g  6  .  furosemide (LASIX) 40 MG tablet Take 80 mg by mouth daily.      Marland Kitchen gabapentin (NEURONTIN) 300 MG capsule Take 600 mg by mouth 3 (three) times daily.       Marland Kitchen ibuprofen (ADVIL,MOTRIN) 200 MG tablet Take 200-400 mg by mouth every 6 (six) hours as needed for fever, headache, mild pain, moderate pain or cramping.      . metFORMIN (GLUCOPHAGE) 500 MG tablet Take 0.5 tablets (250 mg total) by mouth 2 (two) times daily with a meal.  90 tablet  3  . metoprolol tartrate (LOPRESSOR) 25 MG tablet Take 25 mg by mouth 2 (two) times daily.      . potassium citrate (UROCIT-K 10) 10 MEQ (1080 MG) SR tablet Take 10 mEq by mouth 2 (two) times daily.       . ranitidine (ZANTAC) 150 MG tablet Take 150 mg by mouth at bedtime.      Marland Kitchen spironolactone (ALDACTONE) 25 MG tablet Take 25 mg by mouth daily.      Marland Kitchen trolamine salicylate (ASPERCREME) 10 % cream Apply 1 application topically daily. For shoulder and hip pain      . Lancets (ONETOUCH ULTRASOFT) lancets TEST ONCE DAILY AS DIRECTED  100 each  11  . ONE TOUCH ULTRA TEST test strip TEST BLOOD GLUCOSE ONCE DAILY  100 each  3     Objective: BP 143/77  Pulse 57  Temp(Src) 97.6 F (36.4 C) (Rectal)  Resp 10  SpO2 100% Exam: Gen: Lying in bed, repeating the same name over and over, responds the commands intermittently HEENT: NCAT, pupils sluggish but slowly reactive approximately 5 mm to 3 mm, tacky mucous membranes CV: RRR, good S1/S2, no murmur Resp: CTABL, no wheezes, non-labored Abd: SNTND, BS present, no guarding or organomegaly Ext: 1-2+ pitting edema bilaterally to the mid calf Neuro: As above continually repeating the same name over and over, responds with minimal foot and hand movement intermittently   Labs and Imaging: CBC BMET   Recent Labs Lab 06/01/14 2300  WBC 14.5*  HGB 9.0*  HCT 28.6*  PLT 328    Recent Labs Lab 06/01/14 2300  NA 142  K 5.9*  CL 100  CO2 25  BUN 70*  CREATININE 1.95*  GLUCOSE 188*  CALCIUM 9.7      Recent Labs Lab 06/01/14 2300  AST 17  ALT 8  ALKPHOS 68  BILITOT 0.3  PROT 8.1  ALBUMIN 3.2*    Recent Labs Lab 06/02/14 0058 06/02/14 0121  PHART 7.357  --   PCO2ART 48.4*  --   PO2ART 108.0*  --   HCO3 27.1*  --   TCO2 29 25  O2SAT 98.0  --     UDS negative UA clear with negative nitrites and leukocytes  ethyl alcohol less than 11 Salicylate level less than 2 Acetaminophen level less than 15 Lactic acid 1.1  EKG 06/01/2014: Sinus rhythm  Timmothy Euler, MD 06/02/2014, 12:45 AM PGY-3, [facility] pager: [phone], text pages welcome

## 2014-06-02 NOTE — ED Provider Notes (Signed)
CSN: [id number]     Arrival date & time 06/01/14  2239 History   First MD Initiated Contact with Patient 06/01/14 2248     Chief Complaint  Patient presents with  . Altered Mental Status     (Consider location/radiation/quality/duration/timing/severity/associated sxs/prior Treatment) Patient is a 63 y.o. female presenting with altered mental status.  Altered Mental Status Presenting symptoms: partial responsiveness   Severity:  Severe Most recent episode:  Today Episode history:  Single Duration:  4 hours Timing:  Constant Progression:  Unchanged Chronicity:  New Context: recent change in medication   Associated symptoms: no vomiting   Associated symptoms comment:  Back pain, BLUE spasms   Past Medical History  Diagnosis Date  . Hypertension   . Diabetes mellitus   . Depression   . Asthma   . Eczema   . Arthritis   . Cataract   . GERD (gastroesophageal reflux disease)   . History of kidney stones     w/ hx of hydronephrosis - followed by [facility]  . Anemia   . Obesity   . HIV nonspecific serology     2006: indeterminate HIV blood test, seen by ID, felt secondary to cross reacting antibodies with no further workup felt necessary at that time   . Fatty liver     Fatty infiltration of liver noted on 03/2012 CT scan  . CHF, acute 05/03/2012   Past Surgical History  Procedure Laterality Date  . Cystoscopy w/ litholapaxy / ehl    . Cholecystectomy  2003  . Replacement total knee bilateral  2005 &2006  . Joint replacement      bilateral knee replacement  . Vascular surgery Right 03/15/2013    Ultrasound guided sclerotherapy   Family History  Problem Relation Age of Onset  . Diabetes Mother   . Stroke Mother   . Heart disease Father   . Anemia Father    History  Substance Use Topics  . Smoking status: Never Smoker   . Smokeless tobacco: Never Used  . Alcohol Use: No   OB History   Grav Para Term Preterm Abortions TAB SAB Ect Mult Living                  Review of Systems  Unable to perform ROS: Mental status change  Constitutional: Positive for activity change, appetite change and fatigue.  Gastrointestinal: Negative for vomiting and diarrhea.  Musculoskeletal: Positive for back pain.  Skin: Negative for color change and wound.      Allergies  Review of patient's allergies indicates no known allergies.  Home Medications   Prior to Admission medications   Medication Sig Start Date End Date Taking? Authorizing Provider  acetaminophen (TYLENOL) 500 MG tablet Take 1,000 mg by mouth every 6 (six) hours as needed for pain.   Yes Historical Provider, MD  albuterol (PROVENTIL HFA;VENTOLIN HFA) 108 (90 BASE) MCG/ACT inhaler Inhale 2 puffs into the lungs every 6 (six) hours as needed for wheezing or shortness of breath.   Yes Historical Provider, MD  aspirin 81 MG tablet Take 81 mg by mouth daily.   Yes Historical Provider, MD  baclofen (LIORESAL) 10 MG tablet Take 1 tablet (10 mg total) by mouth 3 (three) times daily as needed for muscle spasms. 05/30/14  Yes Leone Haven, MD  benazepril (LOTENSIN) 20 MG tablet Take 1 tablet (20 mg total) by mouth daily. 08/27/13  Yes Lind Covert, MD  fish oil-omega-3 fatty acids 1000 MG capsule Take 1  g by mouth 2 (two) times daily.   Yes Historical Provider, MD  fluticasone (FLOVENT HFA) 110 MCG/ACT inhaler Inhale 2 puffs into the lungs 2 (two) times daily. 12/03/13  Yes Blane Ohara McDiarmid, MD  furosemide (LASIX) 40 MG tablet Take 80 mg by mouth daily.   Yes Historical Provider, MD  gabapentin (NEURONTIN) 300 MG capsule Take 600 mg by mouth 3 (three) times daily.  02/06/14  Yes Richard Blenda Mounts, DPM  ibuprofen (ADVIL,MOTRIN) 200 MG tablet Take 200-400 mg by mouth every 6 (six) hours as needed for fever, headache, mild pain, moderate pain or cramping.   Yes Historical Provider, MD  metFORMIN (GLUCOPHAGE) 500 MG tablet Take 0.5 tablets (250 mg total) by mouth 2 (two) times daily with a meal. 08/27/13  Yes  Lind Covert, MD  metoprolol tartrate (LOPRESSOR) 25 MG tablet Take 25 mg by mouth 2 (two) times daily.   Yes Historical Provider, MD  potassium citrate (UROCIT-K 10) 10 MEQ (1080 MG) SR tablet Take 10 mEq by mouth 2 (two) times daily.    Yes Historical Provider, MD  ranitidine (ZANTAC) 150 MG tablet Take 150 mg by mouth at bedtime.   Yes Historical Provider, MD  spironolactone (ALDACTONE) 25 MG tablet Take 25 mg by mouth daily.   Yes Historical Provider, MD  trolamine salicylate (ASPERCREME) 10 % cream Apply 1 application topically daily. For shoulder and hip pain   Yes Historical Provider, MD  Lancets (ONETOUCH ULTRASOFT) lancets TEST ONCE DAILY AS DIRECTED 07/30/13   Blane Ohara McDiarmid, MD  ONE TOUCH ULTRA TEST test strip TEST BLOOD GLUCOSE ONCE DAILY 07/30/13   Blane Ohara McDiarmid, MD   BP 143/77  Pulse 57  Temp(Src) 97.6 F (36.4 C) (Rectal)  Resp 10  SpO2 100% Physical Exam  Constitutional: She is oriented to person, place, and time. She appears well-developed and well-nourished. No distress.  HENT:  Head: Normocephalic and atraumatic.  Mouth/Throat: No oropharyngeal exudate.  Eyes: Pupils are equal, round, and reactive to light.  Neck: Normal range of motion. Neck supple.  Cardiovascular: Normal rate, regular rhythm and normal heart sounds.  Exam reveals no gallop and no friction rub.   No murmur heard. Pulmonary/Chest: Effort normal and breath sounds normal. No respiratory distress. She has no wheezes. She has no rales.  Abdominal: Soft. Bowel sounds are normal. She exhibits no distension and no mass. There is no tenderness. There is no rebound and no guarding.  Musculoskeletal: Normal range of motion. She exhibits no edema and no tenderness.  Neurological: She is alert and oriented to person, place, and time. She has normal strength. She displays no tremor. She exhibits normal muscle tone. She displays a negative Romberg sign. Coordination and gait normal. GCS eye subscore is 4.  GCS verbal subscore is 2. GCS motor subscore is 5.  Intermittent BLUE twitching, symmetric. Legs not involved.   Skin: Skin is warm and dry.  Psychiatric: She has a normal mood and affect.    ED Course  Procedures (including critical care time) Labs Review Labs Reviewed  CBC WITH DIFFERENTIAL - Abnormal; Notable for the following:    WBC 14.5 (*)    RBC 3.18 (*)    Hemoglobin 9.0 (*)    HCT 28.6 (*)    Neutro Abs 10.9 (*)    All other components within normal limits  COMPREHENSIVE METABOLIC PANEL - Abnormal; Notable for the following:    Potassium 5.9 (*)    Glucose, Bld 188 (*)    BUN  70 (*)    Creatinine, Ser 1.95 (*)    Albumin 3.2 (*)    GFR calc non Af Amer 26 (*)    GFR calc Af Amer 30 (*)    Anion gap 17 (*)    All other components within normal limits  SALICYLATE LEVEL - Abnormal; Notable for the following:    Salicylate Lvl <5.4 (*)    All other components within normal limits  CBG MONITORING, ED - Abnormal; Notable for the following:    Glucose-Capillary 176 (*)    All other components within normal limits  URINE CULTURE  URINALYSIS, ROUTINE W REFLEX MICROSCOPIC  AMMONIA  ACETAMINOPHEN LEVEL  ETHANOL  URINE RAPID DRUG SCREEN (HOSP PERFORMED)  POTASSIUM  BLOOD GAS, ARTERIAL  I-STAT CG4 LACTIC ACID, ED  Randolm Idol, ED    Imaging Review Dg Chest 2 View  06/02/2014   CLINICAL DATA:  Altered mental status  EXAM: CHEST  2 VIEW  COMPARISON:  04/28/2012  FINDINGS: Cardiomegaly. Low lung volumes with vascular congestion. Left basilar airspace opacity could reflect atelectasis or pneumonia. No overt edema. No effusions or acute bony abnormality. Degenerative changes in the shoulders.  IMPRESSION: Cardiomegaly with vascular congestion.  Left basilar atelectasis or consolidation. Cannot exclude pneumonia.   Electronically Signed   By: Rolm Baptise M.D.   On: 06/02/2014 00:06   Ct Head Wo Contrast  06/02/2014   CLINICAL DATA:  Altered mental status.  EXAM: CT HEAD  WITHOUT CONTRAST  TECHNIQUE: Contiguous axial images were obtained from the base of the skull through the vertex without intravenous contrast.  COMPARISON:  03/28/2013  FINDINGS: No acute intracranial abnormality. Specifically, no hemorrhage, hydrocephalus, mass lesion, acute infarction, or significant intracranial injury. No acute calvarial abnormality.  Rounded soft tissue in the floors of the maxillary sinuses bilaterally, likely mucous retention cysts or polyps. No air-fluid levels. Mastoid air cells are clear. Orbital soft tissues unremarkable.  IMPRESSION: No acute intracranial abnormality.   Electronically Signed   By: Rolm Baptise M.D.   On: 06/02/2014 00:07     EKG Interpretation None      MDM   Final diagnoses:  Altered mental status, unspecified altered mental status type    Pt is a 63 y.o. female with Pmhx as above who presents with AMS. Per son, pt has had back pain since ED visit on 6/29 where she was given percocet and flexeril. She saw her PCP on 7/2 and was switched to baclofen. Today son says she became altered sometime between 7-8pm tonight and was found groaning, unable to follow commands, and had bilateral symmetric jerking mvmts of upper extremities.  On PE, pt initially unable to follow commands, but is later able to grunt yes/no and follow commands.   Lab w/u with noncontrib UA, UA, CT head, possible atelectasis L basilar lung.  WBC mildly elevated, but no fever. K elevated, but hemolyzed.  Will repeat.  Family medicine consulted and will admit. Have added ABG and single dose narcan to be given prior to their admission.   Pt did have some improvement of mental status after narcan and is speaking in full sentences.     Neta Ehlers, MD 06/02/14 [phone]

## 2014-06-02 NOTE — Evaluation (Signed)
Clinical/Bedside Swallow Evaluation Patient Details  Name: MIRAI GREENWOOD MRN: [id number] Date of Birth: Sep 04, 1951  Today's Date: 06/02/2014 Time: 0827-0835 SLP Time Calculation (min): 8 min  Past Medical History:  Past Medical History  Diagnosis Date  . Hypertension   . Diabetes mellitus   . Depression   . Asthma   . Eczema   . Arthritis   . Cataract   . GERD (gastroesophageal reflux disease)   . History of kidney stones     w/ hx of hydronephrosis - followed by [facility]  . Anemia   . Obesity   . HIV nonspecific serology     2006: indeterminate HIV blood test, seen by ID, felt secondary to cross reacting antibodies with no further workup felt necessary at that time   . Fatty liver     Fatty infiltration of liver noted on 03/2012 CT scan  . CHF, acute 05/03/2012   Past Surgical History:  Past Surgical History  Procedure Laterality Date  . Cystoscopy w/ litholapaxy / ehl    . Cholecystectomy  2003  . Replacement total knee bilateral  2005 &2006  . Joint replacement      bilateral knee replacement  . Vascular surgery Right 03/15/2013    Ultrasound guided sclerotherapy   HPI:  DANE BLOCH is a 63 y.o. female presenting with altered mental status . PMH is significant for DM2, HTN,OA, combined systolic/diastolic CHF, Depression, and Asthma. She is suspected to have taken medication by accident.   Assessment / Plan / Recommendation Clinical Impression  Pt demonstrates normal swallow function; no evidence of aspiration or difficulty with POs. Pt was sufficiently alert for PO intake. Recommend Regualr texture diet and thin liquids. No SLP f/u needed.     Aspiration Risk  Mild    Diet Recommendation Regular;Thin liquid   Liquid Administration via: Cup;Straw Medication Administration: Whole meds with liquid Supervision: Patient able to self feed Postural Changes and/or Swallow Maneuvers: Seated upright 90 degrees    Other  Recommendations Oral Care  Recommendations: Oral care BID   Follow Up Recommendations  None    Frequency and Duration        Pertinent Vitals/Pain NA    SLP Swallow Goals     Swallow Study Prior Functional Status       General HPI: LAURENCIA ROMA is a 63 y.o. female presenting with altered mental status . PMH is significant for DM2, HTN,OA, combined systolic/diastolic CHF, Depression, and Asthma. She is suspected to have taken medication by accident. Type of Study: Bedside swallow evaluation Diet Prior to this Study: NPO Temperature Spikes Noted: No Respiratory Status: Nasal cannula History of Recent Intubation: No Behavior/Cognition: Lethargic;Cooperative Oral Cavity - Dentition: Adequate natural dentition Self-Feeding Abilities: Able to feed self Patient Positioning: Upright in bed Baseline Vocal Quality: Low vocal intensity Volitional Cough: Strong Volitional Swallow: Able to elicit    Oral/Motor/Sensory Function Overall Oral Motor/Sensory Function: Appears within functional limits for tasks assessed   Ice Chips     Thin Liquid Thin Liquid: Within functional limits Presentation: Cup;Straw;Self Fed    Nectar Thick Nectar Thick Liquid: Not tested   Honey Thick Honey Thick Liquid: Not tested   Puree Puree: Within functional limits   Solid   GO    Solid: Within functional limits      [facility], MA CCC-SLP [phone]  Lynann Beaver 06/02/2014,8:38 AM

## 2014-06-02 NOTE — Progress Notes (Signed)
Receved report from Nurse Vicente Males in E.R.

## 2014-06-02 NOTE — H&P (Signed)
Call Pager [phone] for any questions or notifications regarding this patient  FMTS Attending Admission Note: Sara Neal MD Attending pager:[phone] I  have seen and examined this patient, reviewed their chart. I have discussed this patient with the resident. I agree with the resident's findings, assessment and care plan. 

## 2014-06-02 NOTE — ED Notes (Signed)
Attempted report 

## 2014-06-02 NOTE — Consult Note (Signed)
Referring Physician: Dr. Nori Riis    Chief Complaint: altered mental status.  HPI:                                                                                                                                         [name] is an 63 y.o. female with a ps medical history significant for HTN, DM, OA, combined chronic systolic/diastolic CHF, depression, and asthma, admitted to [facility] due to altered mental status. Patient exhibits waxing and waning mental state changes, can not contribute to her medical history, and thus all information is obtained from her chart. As per EMS pt was last seen normal on 06/01/14 " at 1900, Pt woke up and "wasn't feeling right". Pt family member states that he came back over to the house and found her around 2130 groaning, unable to follow commands, and had bilateral symmetric jerking mvmts of upper extremities". Reported as disoriented with slurred speech and tremors at the time of initial ER evaluation. It is worth noting that pt has had back pain since ED visit on 6/29 where she was given percocet and flexeril. She saw her PCP on 7/2 and was switched to baclofen which has been discontinued. Lab w/u with noncontrib UA, UA, CT head, possible atelectasis L basilar lung. WBC mildly elevated, but no fever. K elevated, but hemolyzed. BUN/creatinine ratio of 35. Review of her home medications: no use of benzodiazepines, SSRIs, or antipsychotics. Date last known well: 06/01/14 Time last known well: 1900 tPA Given: no, out of the window  Past Medical History  Diagnosis Date  . Hypertension   . Diabetes mellitus   . Depression   . Asthma   . Eczema   . Arthritis   . Cataract   . GERD (gastroesophageal reflux disease)   . History of kidney stones     w/ hx of hydronephrosis - followed by [facility]  . Anemia   . Obesity   . HIV nonspecific serology     2006: indeterminate HIV blood test, seen by ID, felt secondary to cross reacting antibodies with no further  workup felt necessary at that time   . Fatty liver     Fatty infiltration of liver noted on 03/2012 CT scan  . CHF, acute 05/03/2012    Past Surgical History  Procedure Laterality Date  . Cystoscopy w/ litholapaxy / ehl    . Cholecystectomy  2003  . Replacement total knee bilateral  2005 &2006  . Joint replacement      bilateral knee replacement  . Vascular surgery Right 03/15/2013    Ultrasound guided sclerotherapy    Family History  Problem Relation Age of Onset  . Diabetes Mother   . Stroke Mother   . Heart disease Father   . Anemia Father    Social History:  reports that she has never smoked. She has never used smokeless tobacco. She reports  that she does not drink alcohol or use illicit drugs.  Allergies: No Known Allergies  Medications:                                                                                                                           Scheduled: . heparin  5,000 Units Subcutaneous 3 times per day  . insulin aspart  0-5 Units Subcutaneous QHS  . insulin aspart  0-9 Units Subcutaneous TID WC  . iohexol  25 mL Oral Q1 Hr x 2  . metoprolol tartrate  25 mg Oral BID  . sodium chloride  3 mL Intravenous Q12H    ROS: unable to obtain due to mental status.                                                                                                                     History obtained from chart review   Physical exam: intermittent lethargy but no apparent distress.Blood pressure 167/104, pulse 75, temperature 98.1 F (36.7 C), temperature source Oral, resp. rate 15, height _0  (1.676 m), weight 103.3 kg (227 lb 11.8 oz), SpO2 100.00%.  Head: normocephalic. Neck: supple, no bruits, no JVD. Cardiac: no murmurs. Lungs: clear. Abdomen: soft, no tender, no mass. Extremities: no edema. Neurologic Examination:                                                                                                      Mental status: intermittently alert but  then becomes inattentive. Follows simple commands. No frank dysarthria or dysphasia. CN 2-12: pupils 2-3 mm, reactive. Unable to assess fundi. No gaze preference. No nystagmus. Blinks to threat. Face seems to be symmetric. Tongue midline. Motor: minimal motor movements arms and wiggles toes. Sensory: reacts to pain. DTR's: 1 all over. Plantars: down. Coordination and gait: no tested. No meningeal signs.   Results for orders placed during the hospital encounter of 06/01/14 (from the past 48 hour(s))  CBC WITH DIFFERENTIAL     Status: Abnormal   Collection Time    06/01/14 11:00 PM  Result Value Ref Range   WBC 14.5 (*) 4.0 - 10.5 K/uL   RBC 3.18 (*) 3.87 - 5.11 MIL/uL   Hemoglobin 9.0 (*) 12.0 - 15.0 g/dL   HCT 28.6 (*) 36.0 - 46.0 %   MCV 89.9  78.0 - 100.0 fL   MCH 28.3  26.0 - 34.0 pg   MCHC 31.5  30.0 - 36.0 g/dL   RDW 14.2  11.5 - 15.5 %   Platelets 328  150 - 400 K/uL   Neutrophils Relative % 75  43 - 77 %   Neutro Abs 10.9 (*) 1.7 - 7.7 K/uL   Lymphocytes Relative 19  12 - 46 %   Lymphs Abs 2.7  0.7 - 4.0 K/uL   Monocytes Relative 5  3 - 12 %   Monocytes Absolute 0.8  0.1 - 1.0 K/uL   Eosinophils Relative 1  0 - 5 %   Eosinophils Absolute 0.1  0.0 - 0.7 K/uL   Basophils Relative 0  0 - 1 %   Basophils Absolute 0.0  0.0 - 0.1 K/uL  COMPREHENSIVE METABOLIC PANEL     Status: Abnormal   Collection Time    06/01/14 11:00 PM      Result Value Ref Range   Sodium 142  137 - 147 mEq/L   Potassium 5.9 (*) 3.7 - 5.3 mEq/L   Comment: HEMOLYSIS AT THIS LEVEL MAY AFFECT RESULT   Chloride 100  96 - 112 mEq/L   CO2 25  19 - 32 mEq/L   Glucose, Bld 188 (*) 70 - 99 mg/dL   BUN 70 (*) 6 - 23 mg/dL   Creatinine, Ser 1.95 (*) 0.50 - 1.10 mg/dL   Calcium 9.7  8.4 - 10.5 mg/dL   Total Protein 8.1  6.0 - 8.3 g/dL   Albumin 3.2 (*) 3.5 - 5.2 g/dL   AST 17  0 - 37 U/L   Comment: HEMOLYSIS AT THIS LEVEL MAY AFFECT RESULT   ALT 8  0 - 35 U/L   Comment: HEMOLYSIS AT THIS LEVEL MAY  AFFECT RESULT   Alkaline Phosphatase 68  39 - 117 U/L   Total Bilirubin 0.3  0.3 - 1.2 mg/dL   GFR calc non Af Amer 26 (*) >90 mL/min   GFR calc Af Amer 30 (*) >90 mL/min   Comment: (NOTE)     The eGFR has been calculated using the CKD EPI equation.     This calculation has not been validated in all clinical situations.     eGFR's persistently <90 mL/min signify possible Chronic Kidney     Disease.   Anion gap 17 (*) 5 - 15  AMMONIA     Status: None   Collection Time    06/01/14 11:00 PM      Result Value Ref Range   Ammonia 28  11 - 60 umol/L  ACETAMINOPHEN LEVEL     Status: None   Collection Time    06/01/14 11:00 PM      Result Value Ref Range   Acetaminophen (Tylenol), Serum <15.0  10 - 30 ug/mL   Comment:            THERAPEUTIC CONCENTRATIONS VARY     SIGNIFICANTLY. A RANGE OF 10-30     ug/mL MAY BE AN EFFECTIVE     CONCENTRATION FOR MANY PATIENTS.     HOWEVER, SOME ARE BEST TREATED     AT CONCENTRATIONS OUTSIDE THIS     RANGE.  ACETAMINOPHEN CONCENTRATIONS     >150 ug/mL AT 4 HOURS AFTER     INGESTION AND >50 ug/mL AT 12     HOURS AFTER INGESTION ARE     OFTEN ASSOCIATED WITH TOXIC     REACTIONS.  ETHANOL     Status: None   Collection Time    06/01/14 11:00 PM      Result Value Ref Range   Alcohol, Ethyl (B) <11  0 - 11 mg/dL   Comment:            LOWEST DETECTABLE LIMIT FOR     SERUM ALCOHOL IS 11 mg/dL     FOR MEDICAL PURPOSES ONLY  SALICYLATE LEVEL     Status: Abnormal   Collection Time    06/01/14 11:00 PM      Result Value Ref Range   Salicylate Lvl <5.4 (*) 2.8 - 20.0 mg/dL  POTASSIUM     Status: Abnormal   Collection Time    06/01/14 11:00 PM      Result Value Ref Range   Potassium 6.0 (*) 3.7 - 5.3 mEq/L   Comment: HEMOLYSIS AT THIS LEVEL MAY AFFECT RESULT  CBG MONITORING, ED     Status: Abnormal   Collection Time    06/01/14 11:05 PM      Result Value Ref Range   Glucose-Capillary 176 (*) 70 - 99 mg/dL  I-STAT TROPOININ, ED     Status:  None   Collection Time    06/01/14 11:14 PM      Result Value Ref Range   Troponin i, poc 0.00  0.00 - 0.08 ng/mL   Comment 3            Comment: Due to the release kinetics of cTnI,     a negative result within the first hours     of the onset of symptoms does not rule out     myocardial infarction with certainty.     If myocardial infarction is still suspected,     repeat the test at appropriate intervals.  I-STAT CG4 LACTIC ACID, ED     Status: None   Collection Time    06/01/14 11:16 PM      Result Value Ref Range   Lactic Acid, Venous 1.10  0.5 - 2.2 mmol/L  URINALYSIS, ROUTINE W REFLEX MICROSCOPIC     Status: None   Collection Time    06/01/14 11:17 PM      Result Value Ref Range   Color, Urine YELLOW  YELLOW   APPearance CLEAR  CLEAR   Specific Gravity, Urine 1.016  1.005 - 1.030   pH 5.0  5.0 - 8.0   Glucose, UA NEGATIVE  NEGATIVE mg/dL   Hgb urine dipstick NEGATIVE  NEGATIVE   Bilirubin Urine NEGATIVE  NEGATIVE   Ketones, ur NEGATIVE  NEGATIVE mg/dL   Protein, ur NEGATIVE  NEGATIVE mg/dL   Urobilinogen, UA 0.2  0.0 - 1.0 mg/dL   Nitrite NEGATIVE  NEGATIVE   Leukocytes, UA NEGATIVE  NEGATIVE   Comment: MICROSCOPIC NOT DONE ON URINES WITH NEGATIVE PROTEIN, BLOOD, LEUKOCYTES, NITRITE, OR GLUCOSE <1000 mg/dL.  URINE RAPID DRUG SCREEN (HOSP PERFORMED)     Status: None   Collection Time    06/01/14 11:17 PM      Result Value Ref Range   Opiates NONE DETECTED  NONE DETECTED   Cocaine NONE DETECTED  NONE DETECTED   Benzodiazepines NONE DETECTED  NONE DETECTED   Amphetamines NONE DETECTED  NONE DETECTED   Tetrahydrocannabinol NONE DETECTED  NONE DETECTED   Barbiturates NONE DETECTED  NONE DETECTED   Comment:            DRUG SCREEN FOR MEDICAL PURPOSES     ONLY.  IF CONFIRMATION IS NEEDED     FOR ANY PURPOSE, NOTIFY LAB     WITHIN 5 DAYS.                LOWEST DETECTABLE LIMITS     FOR URINE DRUG SCREEN     Drug Class       Cutoff (ng/mL)     Amphetamine      1000      Barbiturate      200     Benzodiazepine   734     Tricyclics       193     Opiates          300     Cocaine          300     THC              50  I-STAT ARTERIAL BLOOD GAS, ED     Status: Abnormal   Collection Time    06/02/14 12:58 AM      Result Value Ref Range   pH, Arterial 7.357  7.350 - 7.450   pCO2 arterial 48.4 (*) 35.0 - 45.0 mmHg   pO2, Arterial 108.0 (*) 80.0 - 100.0 mmHg   Bicarbonate 27.1 (*) 20.0 - 24.0 mEq/L   TCO2 29  0 - 100 mmol/L   O2 Saturation 98.0     Acid-Base Excess 1.0  0.0 - 2.0 mmol/L   Patient temperature 98.7 F     Collection site RADIAL, ALLEN'S TEST ACCEPTABLE     Drawn by Operator     Sample type ARTERIAL    I-STAT CHEM 8, ED     Status: Abnormal   Collection Time    06/02/14  1:21 AM      Result Value Ref Range   Sodium 142  137 - 147 mEq/L   Potassium 5.3  3.7 - 5.3 mEq/L   Chloride 108  96 - 112 mEq/L   BUN 74 (*) 6 - 23 mg/dL   Creatinine, Ser 2.10 (*) 0.50 - 1.10 mg/dL   Glucose, Bld 180 (*) 70 - 99 mg/dL   Calcium, Ion 1.16  1.13 - 1.30 mmol/L   TCO2 25  0 - 100 mmol/L   Hemoglobin 9.9 (*) 12.0 - 15.0 g/dL   HCT 29.0 (*) 36.0 - 46.0 %  MRSA PCR SCREENING     Status: None   Collection Time    06/02/14  3:32 AM      Result Value Ref Range   MRSA by PCR NEGATIVE  NEGATIVE   Comment:            The GeneXpert MRSA Assay (FDA     approved for NASAL specimens     only), is one component of a     comprehensive MRSA colonization     surveillance program. It is not     intended to diagnose MRSA     infection nor to guide or     monitor treatment for     MRSA infections.  GLUCOSE, CAPILLARY     Status: Abnormal   Collection Time    06/02/14  4:08 AM      Result Value Ref Range   Glucose-Capillary 178 (*)  70 - 99 mg/dL  BASIC METABOLIC PANEL     Status: Abnormal   Collection Time    06/02/14  4:45 AM      Result Value Ref Range   Sodium 144  137 - 147 mEq/L   Potassium 5.1  3.7 - 5.3 mEq/L   Chloride 103  96 - 112 mEq/L    CO2 25  19 - 32 mEq/L   Glucose, Bld 178 (*) 70 - 99 mg/dL   BUN 67 (*) 6 - 23 mg/dL   Creatinine, Ser 1.89 (*) 0.50 - 1.10 mg/dL   Calcium 9.5  8.4 - 10.5 mg/dL   GFR calc non Af Amer 27 (*) >90 mL/min   GFR calc Af Amer 31 (*) >90 mL/min   Comment: (NOTE)     The eGFR has been calculated using the CKD EPI equation.     This calculation has not been validated in all clinical situations.     eGFR's persistently <90 mL/min signify possible Chronic Kidney     Disease.   Anion gap 16 (*) 5 - 15  CBC     Status: Abnormal   Collection Time    06/02/14  4:45 AM      Result Value Ref Range   WBC 13.8 (*) 4.0 - 10.5 K/uL   RBC 3.18 (*) 3.87 - 5.11 MIL/uL   Hemoglobin 8.8 (*) 12.0 - 15.0 g/dL   HCT 28.9 (*) 36.0 - 46.0 %   MCV 90.9  78.0 - 100.0 fL   MCH 27.7  26.0 - 34.0 pg   MCHC 30.4  30.0 - 36.0 g/dL   RDW 14.3  11.5 - 15.5 %   Platelets 310  150 - 400 K/uL  GLUCOSE, CAPILLARY     Status: Abnormal   Collection Time    06/02/14  8:18 AM      Result Value Ref Range   Glucose-Capillary 123 (*) 70 - 99 mg/dL  GLUCOSE, CAPILLARY     Status: Abnormal   Collection Time    06/02/14 11:23 AM      Result Value Ref Range   Glucose-Capillary 127 (*) 70 - 99 mg/dL  GLUCOSE, CAPILLARY     Status: Abnormal   Collection Time    06/02/14  5:14 PM      Result Value Ref Range   Glucose-Capillary 162 (*) 70 - 99 mg/dL  GLUCOSE, CAPILLARY     Status: Abnormal   Collection Time    06/02/14  7:35 PM      Result Value Ref Range   Glucose-Capillary 140 (*) 70 - 99 mg/dL  BASIC METABOLIC PANEL     Status: Abnormal   Collection Time    06/02/14  8:27 PM      Result Value Ref Range   Sodium 144  137 - 147 mEq/L   Potassium 4.4  3.7 - 5.3 mEq/L   Chloride 105  96 - 112 mEq/L   CO2 26  19 - 32 mEq/L   Glucose, Bld 173 (*) 70 - 99 mg/dL   BUN 43 (*) 6 - 23 mg/dL   Comment: DELTA CHECK NOTED   Creatinine, Ser 1.26 (*) 0.50 - 1.10 mg/dL   Calcium 9.2  8.4 - 10.5 mg/dL   GFR calc non Af Amer 44  (*) >90 mL/min   GFR calc Af Amer 51 (*) >90 mL/min   Comment: (NOTE)     The eGFR has been calculated using the CKD EPI equation.  This calculation has not been validated in all clinical situations.     eGFR's persistently <90 mL/min signify possible Chronic Kidney     Disease.   Anion gap 13  5 - 15  GLUCOSE, CAPILLARY     Status: Abnormal   Collection Time    06/02/14  9:00 PM      Result Value Ref Range   Glucose-Capillary 151 (*) 70 - 99 mg/dL   Dg Chest 2 View  06/02/2014   CLINICAL DATA:  Altered mental status  EXAM: CHEST  2 VIEW  COMPARISON:  04/28/2012  FINDINGS: Cardiomegaly. Low lung volumes with vascular congestion. Left basilar airspace opacity could reflect atelectasis or pneumonia. No overt edema. No effusions or acute bony abnormality. Degenerative changes in the shoulders.  IMPRESSION: Cardiomegaly with vascular congestion.  Left basilar atelectasis or consolidation. Cannot exclude pneumonia.   Electronically Signed   By: Rolm Baptise M.D.   On: 06/02/2014 00:06   Ct Head Wo Contrast  06/02/2014   CLINICAL DATA:  Altered mental status.  EXAM: CT HEAD WITHOUT CONTRAST  TECHNIQUE: Contiguous axial images were obtained from the base of the skull through the vertex without intravenous contrast.  COMPARISON:  03/28/2013  FINDINGS: No acute intracranial abnormality. Specifically, no hemorrhage, hydrocephalus, mass lesion, acute infarction, or significant intracranial injury. No acute calvarial abnormality.  Rounded soft tissue in the floors of the maxillary sinuses bilaterally, likely mucous retention cysts or polyps. No air-fluid levels. Mastoid air cells are clear. Orbital soft tissues unremarkable.  IMPRESSION: No acute intracranial abnormality.   Electronically Signed   By: Rolm Baptise M.D.   On: 06/02/2014 00:07    Assessment: 63 y.o. female with new onset fluctuating mental status without obvious lateralizing signs on neuro-exam. Mild leukocytosis without fever. Possible  prerenal acute renal injury and left basilar atelectasis or consolidation versus questionable pneumonia. Although can not entirely exclude an acute cerebrovascular insult, patient's waxing and waning mental status appears most consistent with an encephalopathy, perhaps metabolic-toxic (mild prerenal injury, ? left pneumonia, medications) MRI brain already requested. Will get EEG to assess severity of encephalopathy. Will follow up.  Dorian Pod, MD [facility] [phone]  06/02/2014, 11:09 PM

## 2014-06-02 NOTE — ED Notes (Signed)
Bed control notified that the pt's bed placement has been changed.

## 2014-06-02 NOTE — ED Notes (Signed)
Pt is alert, and knows her name, how ever she stares straight ahead, and keeps repeating "my son" or "where's my son".

## 2014-06-02 NOTE — Progress Notes (Signed)
Upon assessment at 1900, patient alert/oriented x2 and able to follow some commands. Checked on patient at [place] and pt's speech was incomprehensible. Patient would not follow any commands or respond to voice, and pt would repeatedly mumble words. BP 178/79, HR 106. Checked CBG and it was 140, pupils are equal and reactive, but sluggish. Paged teaching service, and MD's now at bedside. Received order to place foley. Family also at bedside. Will continue to monitor closely.  After 30 minutes, pt spontaneously became more alert and oriented. Pt is now able to follow some commands and is a/o x2.

## 2014-06-02 NOTE — Progress Notes (Signed)
Attempted to get report from ED 

## 2014-06-03 ENCOUNTER — Inpatient Hospital Stay (HOSPITAL_COMMUNITY): Payer: PRIVATE HEALTH INSURANCE

## 2014-06-03 ENCOUNTER — Encounter (HOSPITAL_COMMUNITY): Payer: Self-pay | Admitting: *Deleted

## 2014-06-03 DIAGNOSIS — M545 Low back pain, unspecified: Secondary | ICD-10-CM

## 2014-06-03 DIAGNOSIS — M1990 Unspecified osteoarthritis, unspecified site: Secondary | ICD-10-CM

## 2014-06-03 LAB — GLUCOSE, CAPILLARY
GLUCOSE-CAPILLARY: 184 mg/dL — AB (ref 70–99)
Glucose-Capillary: 162 mg/dL — ABNORMAL HIGH (ref 70–99)
Glucose-Capillary: 148 mg/dL — ABNORMAL HIGH (ref 70–99)

## 2014-06-03 LAB — BASIC METABOLIC PANEL
Anion gap: 16 — ABNORMAL HIGH (ref 5–15)
BUN: 23 mg/dL (ref 6–23)
CO2: 24 mEq/L (ref 19–32)
CREATININE: 1.03 mg/dL (ref 0.50–1.10)
Calcium: 9.7 mg/dL (ref 8.4–10.5)
Chloride: 105 mEq/L (ref 96–112)
GFR calc Af Amer: 65 mL/min — ABNORMAL LOW (ref >90)
GFR, EST NON AFRICAN AMERICAN: 56 mL/min — AB (ref >90)
GLUCOSE: 193 mg/dL — AB (ref 70–99)
Potassium: 4.4 mEq/L (ref 3.7–5.3)
SODIUM: 145 meq/L (ref 137–147)

## 2014-06-03 LAB — CBC WITH DIFFERENTIAL/PLATELET
BASOS PCT: 0 % (ref 0–1)
Basophils Absolute: 0 10*3/uL (ref 0.0–0.1)
EOS ABS: 0 10*3/uL (ref 0.0–0.7)
Eosinophils Relative: 0 % (ref 0–5)
HCT: 31.8 % — ABNORMAL LOW (ref 36.0–46.0)
Hemoglobin: 9.9 g/dL — ABNORMAL LOW (ref 12.0–15.0)
LYMPHS ABS: 2.5 10*3/uL (ref 0.7–4.0)
Lymphocytes Relative: 13 % (ref 12–46)
MCH: 28.2 pg (ref 26.0–34.0)
MCHC: 31.1 g/dL (ref 30.0–36.0)
MCV: 90.6 fL (ref 78.0–100.0)
MONO ABS: 1.8 10*3/uL — AB (ref 0.1–1.0)
Monocytes Relative: 9 % (ref 3–12)
NEUTROS PCT: 78 % — AB (ref 43–77)
Neutro Abs: 15.2 10*3/uL — ABNORMAL HIGH (ref 1.7–7.7)
Platelets: 344 10*3/uL (ref 150–400)
RBC: 3.51 MIL/uL — ABNORMAL LOW (ref 3.87–5.11)
RDW: 14.4 % (ref 11.5–15.5)
WBC: 19.5 10*3/uL — ABNORMAL HIGH (ref 4.0–10.5)

## 2014-06-03 LAB — URINE CULTURE
COLONY COUNT: NO GROWTH
Culture: NO GROWTH

## 2014-06-03 LAB — TSH: TSH: 0.22 u[IU]/mL — ABNORMAL LOW (ref 0.350–4.500)

## 2014-06-03 MED ORDER — ONDANSETRON HCL 4 MG/2ML IJ SOLN
INTRAMUSCULAR | Status: AC
  Filled 2014-06-03: qty 2

## 2014-06-03 MED ORDER — MORPHINE SULFATE 2 MG/ML IJ SOLN
1.0000 mg | Freq: Once | INTRAMUSCULAR | Status: AC
  Administered 2014-06-03: 1 mg via INTRAVENOUS
  Filled 2014-06-03: qty 1

## 2014-06-03 MED ORDER — IOHEXOL 300 MG/ML  SOLN: 100.0000 mL | Freq: Once | INTRAMUSCULAR | Status: AC | PRN

## 2014-06-03 MED ORDER — LORAZEPAM 2 MG/ML IJ SOLN: 1.0000 mg | Freq: Once | INTRAMUSCULAR | Status: AC | PRN

## 2014-06-03 MED ORDER — ONDANSETRON HCL 4 MG/2ML IJ SOLN
4.0000 mg | Freq: Four times a day (QID) | INTRAMUSCULAR | Status: DC | PRN
  Administered 2014-06-03 – 2014-06-04 (×4): 4 mg via INTRAVENOUS
  Filled 2014-06-03 (×3): qty 2

## 2014-06-03 MED ORDER — SENNOSIDES-DOCUSATE SODIUM 8.6-50 MG PO TABS
2.0000 | ORAL_TABLET | Freq: Two times a day (BID) | ORAL | Status: DC
  Administered 2014-06-03 – 2014-06-04 (×2): 2 via ORAL
  Filled 2014-06-03 (×2): qty 2

## 2014-06-03 MED ORDER — MORPHINE SULFATE 2 MG/ML IJ SOLN
2.0000 mg | INTRAMUSCULAR | Status: DC | PRN
  Administered 2014-06-03 – 2014-06-04 (×2): 2 mg via INTRAVENOUS
  Filled 2014-06-03 (×2): qty 1

## 2014-06-03 MED ORDER — LORAZEPAM 2 MG/ML IJ SOLN
1.0000 mg | Freq: Four times a day (QID) | INTRAMUSCULAR | Status: DC | PRN
  Administered 2014-06-03: 1 mg via INTRAVENOUS
  Filled 2014-06-03: qty 1

## 2014-06-03 MED ORDER — HYDRALAZINE HCL 20 MG/ML IJ SOLN
5.0000 mg | Freq: Once | INTRAMUSCULAR | Status: AC
  Administered 2014-06-03: 07:00:00 via INTRAVENOUS
  Filled 2014-06-03: qty 0.25

## 2014-06-03 MED ORDER — LORAZEPAM 2 MG/ML IJ SOLN
INTRAMUSCULAR | Status: AC
  Administered 2014-06-03: 1 mg via INTRAVENOUS
  Filled 2014-06-03: qty 1

## 2014-06-03 MED ORDER — MORPHINE SULFATE 2 MG/ML IJ SOLN
2.0000 mg | Freq: Once | INTRAMUSCULAR | Status: AC
  Administered 2014-06-03: 2 mg via INTRAVENOUS
  Filled 2014-06-03: qty 1

## 2014-06-03 MED ORDER — HYDRALAZINE HCL 20 MG/ML IJ SOLN
5.0000 mg | Freq: Once | INTRAMUSCULAR | Status: AC
  Administered 2014-06-03: 5 mg via INTRAVENOUS
  Filled 2014-06-03: qty 0.25

## 2014-06-03 NOTE — Progress Notes (Signed)
PCP Hospital Visit Note  I appreciate the care and expertise of the [facility] team and consultants  I have known [name] for years and she has never has a similar episode.  Agree the most likely diagnosis is delirium related to medications.  Hopefully she will clear in the next few days.

## 2014-06-03 NOTE — Progress Notes (Signed)
Per report pt had been either lethargic or calling out repeatedly. On assessment pt was A&Ox4. Pt became very agittaged. MD was called multiple times with no new order. MD paged again. MD at bedside with family. Given order for a total of 3 mg of morphine and 1 mg ativan. Pt more comfortable and calm. Family updated by MD. Will continue to monitor.

## 2014-06-03 NOTE — Progress Notes (Signed)
Bedside EE completed, results pending.

## 2014-06-03 NOTE — Progress Notes (Signed)
Per MD gave pt 1 mg of morphine at 1125 and 2 mg of morphine at 1154. 1 mg of morphine was wasted by two RNs in [place]. Pyxis still saying undocumented waste. Pharmacy notified.

## 2014-06-03 NOTE — Progress Notes (Signed)
Family Medicine Teaching Service Daily Progress Note Intern Pager: [phone]  Patient name: [name] Medical record number: [id number] Date of birth: February 23, 1951 Age: 63 y.o. Gender: female  Primary Care Provider: Lind Covert, MD Consultants: Neurology Code Status: FULL  Pt Overview and Major Events to Date:  7/5: Patient admitted from ED with AMS  Assessment and Plan: JAZARAH CAPILI is a 63 y.o. female presenting with altered mental status . PMH is significant for DM2, HTN,OA, combined systolic/diastolic CHF, Depression, and Asthma.   Acute encephalopathy  - telemetry  - Unclear etiology, differential diagnosis includes drug-induced, dehydration, metabolic, CVA, infection - Neuro Consulted (thank you):  Metabolic process?, Brain MRI (no acute pathology), EEG (wnl) - Hemodynamically stable with ABG showing reasonable ventilation, lactic acid 1.1  - Responded slightly to Narcan injection but her UDS was negative  - Tylenol and salicylate level within normal limits, glucose 162, UA negative, ammonia 28  - Blood cultures (neg to date) - Pt found to have significant urinary retention (737ml w/ cath); foley placed  Hyperkalemia  - Previous measurement of 5.9 hemolyzed, repeat was 5.3; 7/6 = 4.4 (wnl)  - Hold spironolactone and lisinopril  - Possibly dehydration related   Acute kidney injury  - BUN/creatinine ratio of 35 indicates likely prerenal  - baseline cre 1.0-1.3  - Tacky mucous membranes   DM2  - Previous A1c 6.1 on 12/31/2013  - SSI while admitted  - On metformin at home, hold with AKI and through the rest of admission   HTN  - 150s over 80s during exam  - Hold Lasix, benazepril, metoprolol, spironolactone  - Hydralazine 5mg  inj. If BP >180/>90  Normocytic anemia  - No obvious source of bleeding  - ?anemia of chronic disease, but no obvious chronic inflammatory process  - monitor, will likely be lower after hydration.   Combined CHF  - Slight edema on  exam, tacky mucous membranes, BUN/creatinine ratio remains at or near 35  - Previous echo 2013 with EF 20-25 and grade 2 diastolic dysf, Repeat 93/7902 with EF 55-60 and grade 1 diastolic dysf  - NS given cautiously   Asthma  - Lungs clear exam, albuterol and home Flovent when her mental status improves   OA  - Will discuss pain medications when her mental status improves  - Hold muscle relaxants for now - Lumbar MRI ordered to r/o possible compression fxr contributing to back pain   FEN/GI: NS @100ml /hr, Senokot-S  PPx: Heparin  Disposition: Home with family after medically cleared  Subjective:  Patient has been experiencing constant, yet waxing/waning in character, delirium. She is constantly repeating words in a disordered chaotic way and is regularly agitated. Interestingly enough when asked direct questions she is completely oriented, and answers questions about her family correctly (per her son who is at her side). Sentences are short and choppy. She regularly complains of pain, specifically Back, Abdominal, and bilateral Foot pain. Foot pain is burning, itching, and extreme in nature. Even the slightest touch seems to cause her discomfort.   Objective: Temp:  [97.6 F (36.4 C)-98.1 F (36.7 C)] 98 F (36.7 C) (07/06 1230) Pulse Rate:  [58-210] 63 (07/06 0257) Resp:  [11-38] 16 (07/06 1230) BP: (137-199)/(55-116) 152/111 mmHg (07/06 1230) SpO2:  [94 %-100 %] 99 % (07/06 1230) Weight:  [227 lb 4.7 oz (103.1 kg)] 227 lb 4.7 oz (103.1 kg) (07/06 0257) Physical Exam: Gen: Lying in bed, repeating the same word over and over, responds the commands. Oriented x3  HEENT: NCAT, pupils sluggish but slowly reactive approximately 5 mm to 3 mm, tacky mucous membranes  CV: RRR, good S1/S2, no murmur  Resp: CTABL, no wheezes, non-labored  Abd: Slightley distended non tender, obese, BS present, no guarding or organomegaly  Ext: 1-2+ pitting edema bilaterally to the mid calf  Neuro: As  above continually repeating the same name over and over, responds with minimal foot and hand movement intermittently. Significant UE resting tremor noted bilaterally (Rt>Lt)  Laboratory:  Recent Labs Lab 06/01/14 2300 06/02/14 0121 06/02/14 0445  WBC 14.5*  --  13.8*  HGB 9.0* 9.9* 8.8*  HCT 28.6* 29.0* 28.9*  PLT 328  --  310    Recent Labs Lab 06/01/14 2300 06/02/14 0121 06/02/14 0445 06/02/14 2027  NA 142 142 144 144  K 5.9*  6.0* 5.3 5.1 4.4  CL 100 108 103 105  CO2 25  --  25 26  BUN 70* 74* 67* 43*  CREATININE 1.95* 2.10* 1.89* 1.26*  CALCIUM 9.7  --  9.5 9.2  PROT 8.1  --   --   --   BILITOT 0.3  --   --   --   ALKPHOS 68  --   --   --   ALT 8  --   --   --   AST 17  --   --   --   GLUCOSE 188* 180* 178* 173*      Imaging/Diagnostic Tests: CT Head w/o Contrast IMPRESSION:  No acute intracranial abnormality.  CT Head w/ Contrast IMPRESSION:  Atelectasis in both lung bases.  Mild diffuse fatty infiltration in the liver. Right-sided  pyelocaliectasis and ureterectasis with transition zone at the level  of the sacrum. No obstructing stone is visualized. This may indicate  non radiopaque stone or stricture.  CT Abd/Pelvis w/ Con IMPRESSION:  Atelectasis in both lung bases.  Mild diffuse fatty infiltration in the liver. Right-sided  pyelocaliectasis and ureterectasis with transition zone at the level  of the sacrum. No obstructing stone is visualized. This may indicate  non radiopaque stone or stricture.  MR Brain IMPRESSION:  Mild chronic microvascular ischemic changes in the white matter. No  acute abnormality.  Sinusitis with air-fluid levels.  MR Lumbar Spine (Ordered)   Elberta Leatherwood, MD 06/03/2014, 3:47 PM PGY-1, [place] Intern pager: [phone], text pages welcome

## 2014-06-03 NOTE — Procedures (Signed)
ELECTROENCEPHALOGRAM REPORT  Date of Study: 06/03/2014  Patient's Name: [name] MRN: [id number] Date of Birth: Mar 16, 1951  Referring Provider: Dr. Dorian Pod  Clinical History: This is a 63 year old woman with new onset fluctuating altered mental status  Medications: Metoprolol  Technical Summary: A multichannel digital EEG recording measured by the international 10-20 system with electrodes applied with paste and impedances below 5000 ohms performed as portable with EKG monitoring in an awake and drowsy patient.  Hyperventilation and photic stimulation were not performed.  The digital EEG was referentially recorded, reformatted, and digitally filtered in a variety of bipolar and referential montages for optimal display.  Spike detection software was employed.  Description: The patient is awake and drowsy during the recording.  She is unresponsive, localizing repeated words, calling out for her sister. During maximal wakefulness, there is a symmetric, medium voltage 8-8.5 Hz posterior dominant rhythm that attenuates with eye opening.  The record is symmetric.  During drowsiness, there is slight increase in theta slowing of the background.  Hyperventilation and photic stimulation were not performed.  The patient is noted to be confused, vocalizing repeatedly, with jaw tremors, side-to-side head movements, gagging, with no associated epileptiform discharges. There were no epileptiform discharges or electrographic seizures seen.    EKG lead showed irregular rhythm.  Impression: This awake and drowsy EEG is normal.  The patient is noted to be confused, vocalizing repeatedly, with jaw tremors, side-to-side head movements, gagging, with no associated epileptiform discharges.   Clinical Correlation: There were no electrical seizures seen in the study. A normal EEG does not exclude a clinical diagnosis of epilepsy.  Clinical correlation is advised.   Ellouise Newer, M.D.

## 2014-06-03 NOTE — Progress Notes (Signed)
FMTS Attending Note  I personally saw and evaluated the patient. The plan of care was discussed with the resident team. I agree with the assessment and plan as documented by the resident.   Patient seen with resident physician at 1200. She is acutely agitated and keeps repeating herself, calms when asked about family, complains of pain in feet and wants to have foley catheter removed, given Ativan 1 mg which helped with agitation, oriented when asking specific questions  Bilateral feet slightly cool - unable to palpate right DP pulse however brisk pulse on doppler, normal left DP pulses, not consistent with acute ischemia, monitor clinically  1. Encephalopathy - metabolic workup negative, MRI head negative for CVA, awaiting EEG results, may be secondary to Baclofen use with overlying hospital delirium, given one time dose of Ativan 1mg , consider additional doses if agitation continues 2. Back Pain - in the setting of urine retention and slight leukocytosis will check MRI lumbar spine with and without contrast to evaluate for DDD/Diskitis/mass. Patient has no tenderness with direct palpation of lumbar spine and is unable to complete neuro examination due to acute agitation, patient given one time dose of Morphine, will need to closely monitor pain 3. AKI - prerenal, encourage PO 4. T2DM - agree with holding Metformin in the context of AKI, continue ISS 5. Combined CHF - no evidence of acute exacerbation 6. Peripheral Neuropathy - on gabapentin, will not increase in acute setting due to encephalopathy  Dossie Arbour MD

## 2014-06-03 NOTE — Progress Notes (Signed)
Utilization review completed. Rahmir Beever, RN, BSN. 

## 2014-06-04 ENCOUNTER — Inpatient Hospital Stay (HOSPITAL_COMMUNITY): Payer: PRIVATE HEALTH INSURANCE

## 2014-06-04 ENCOUNTER — Ambulatory Visit: Payer: PRIVATE HEALTH INSURANCE | Admitting: Family Medicine

## 2014-06-04 LAB — CBC
HCT: 29.3 % — ABNORMAL LOW (ref 36.0–46.0)
Hemoglobin: 9.1 g/dL — ABNORMAL LOW (ref 12.0–15.0)
MCH: 28.1 pg (ref 26.0–34.0)
MCHC: 31.1 g/dL (ref 30.0–36.0)
MCV: 90.4 fL (ref 78.0–100.0)
PLATELETS: 342 10*3/uL (ref 150–400)
RBC: 3.24 MIL/uL — ABNORMAL LOW (ref 3.87–5.11)
RDW: 14.2 % (ref 11.5–15.5)
WBC: 20.2 10*3/uL — ABNORMAL HIGH (ref 4.0–10.5)

## 2014-06-04 LAB — BASIC METABOLIC PANEL
Anion gap: 14 (ref 5–15)
BUN: 19 mg/dL (ref 6–23)
CALCIUM: 9 mg/dL (ref 8.4–10.5)
CHLORIDE: 104 meq/L (ref 96–112)
CO2: 24 mEq/L (ref 19–32)
CREATININE: 1.02 mg/dL (ref 0.50–1.10)
GFR calc Af Amer: 65 mL/min — ABNORMAL LOW (ref >90)
GFR calc non Af Amer: 56 mL/min — ABNORMAL LOW (ref >90)
GLUCOSE: 207 mg/dL — AB (ref 70–99)
Potassium: 4.5 mEq/L (ref 3.7–5.3)
Sodium: 142 mEq/L (ref 137–147)

## 2014-06-04 LAB — GLUCOSE, CAPILLARY
GLUCOSE-CAPILLARY: 212 mg/dL — AB (ref 70–99)
Glucose-Capillary: 229 mg/dL — ABNORMAL HIGH (ref 70–99)
Glucose-Capillary: 185 mg/dL — ABNORMAL HIGH (ref 70–99)
Glucose-Capillary: 163 mg/dL — ABNORMAL HIGH (ref 70–99)

## 2014-06-04 LAB — URIC ACID: Uric Acid, Serum: 8.3 mg/dL — ABNORMAL HIGH (ref 2.4–7.0)

## 2014-06-04 MED ORDER — BENAZEPRIL HCL 20 MG PO TABS
20.0000 mg | ORAL_TABLET | Freq: Every day | ORAL | Status: DC
  Administered 2014-06-04 – 2014-06-06 (×3): 20 mg via ORAL
  Filled 2014-06-04 (×3): qty 1

## 2014-06-04 MED ORDER — SODIUM CHLORIDE 0.45 % IV SOLN
INTRAVENOUS | Status: DC
  Administered 2014-06-04: 05:00:00 via INTRAVENOUS

## 2014-06-04 MED ORDER — HYDROMORPHONE HCL PF 1 MG/ML IJ SOLN
1.0000 mg | INTRAMUSCULAR | Status: DC | PRN
  Administered 2014-06-04 (×2): 1 mg via INTRAVENOUS
  Filled 2014-06-04 (×2): qty 1

## 2014-06-04 MED ORDER — HYDRALAZINE HCL 25 MG PO TABS
25.0000 mg | ORAL_TABLET | Freq: Four times a day (QID) | ORAL | Status: DC | PRN
  Administered 2014-06-04: 25 mg via ORAL
  Filled 2014-06-04: qty 1

## 2014-06-04 MED ORDER — HYDRALAZINE HCL 50 MG PO TABS
50.0000 mg | ORAL_TABLET | Freq: Three times a day (TID) | ORAL | Status: DC
  Administered 2014-06-04 – 2014-06-06 (×7): 50 mg via ORAL
  Filled 2014-06-04 (×10): qty 1

## 2014-06-04 MED ORDER — HYDRALAZINE HCL 20 MG/ML IJ SOLN: 10.0000 mg | INTRAMUSCULAR | Status: DC | PRN

## 2014-06-04 MED ORDER — HYDRALAZINE HCL 50 MG PO TABS
50.0000 mg | ORAL_TABLET | Freq: Four times a day (QID) | ORAL | Status: DC | PRN
  Filled 2014-06-04: qty 1

## 2014-06-04 MED ORDER — INDOMETHACIN 50 MG PO CAPS
50.0000 mg | ORAL_CAPSULE | Freq: Two times a day (BID) | ORAL | Status: DC
  Administered 2014-06-04 – 2014-06-06 (×4): 50 mg via ORAL
  Filled 2014-06-04 (×8): qty 1

## 2014-06-04 NOTE — Progress Notes (Signed)
Subjective: Patient in severe right arm pain that she feels is worse at the elbow but present throughout the upper extremity.  Mentl statud now appropriate.    Objective: Current vital signs: BP 171/99  Pulse 110  Temp(Src) 98.4 F (36.9 C) (Oral)  Resp 15  Ht 5\' 6"  (1.676 m)  Wt 105.2 kg (231 lb 14.8 oz)  BMI 37.45 kg/m2  SpO2 100% Vital signs in last 24 hours: Temp:  [97.9 F (36.6 C)-98.7 F (37.1 C)] 98.4 F (36.9 C) (07/07 0752) Pulse Rate:  [84-110] 110 (07/07 0752) Resp:  [15-26] 15 (07/07 0752) BP: (152-199)/(85-111) 171/99 mmHg (07/07 0752) SpO2:  [95 %-100 %] 100 % (07/07 0752) Weight:  [105.2 kg (231 lb 14.8 oz)] 105.2 kg (231 lb 14.8 oz) (07/07 0339)  Intake/Output from previous day: 07/06 0701 - 07/07 0700 In: 2548.5 [P.O.:360; I.V.:2188.5] Out: 2702 [Urine:2700; Stool:2] Intake/Output this shift:   Nutritional status: Carb Control  Neurologic Exam: Mental Status: Alert, oriented, thought content appropriate.  Speech fluent without evidence of aphasia.  Able to follow 3 step commands without difficulty. Cranial Nerves: II: Discs flat bilaterally; Visual fields grossly normal, pupils equal, round, reactive to light and accommodation III,IV, VI: ptosis not present, extra-ocular motions intact bilaterally V,VII: smile symmetric, facial light touch sensation normal bilaterally VIII: hearing normal bilaterally IX,X: gag reflex present XI: bilateral shoulder shrug XII: midline tongue extension Motor: Right : Upper extremity   *    Left:     Upper extremity   5/5  Lower extremity   5/5     Lower extremity   5/5 * Unable to test RUE due to pain.  With some movement in all muscle groups Tone and bulk:normal tone throughout; no atrophy noted Sensory: Some degree of pain to palpation throughout RUE including the fingers.  Deep Tendon Reflexes: 1+ and symmetric throughout Plantars: Right: downgoing   Left: downgoing   Lab Results: Basic Metabolic  Panel:  Recent Labs Lab 06/01/14 2300 06/02/14 0121 06/02/14 0445 06/02/14 2027 06/03/14 1820 06/04/14 0230  NA 142 142 144 144 145 142  K 5.9*  6.0* 5.3 5.1 4.4 4.4 4.5  CL 100 108 103 105 105 104  CO2 25  --  25 26 24 24   GLUCOSE 188* 180* 178* 173* 193* 207*  BUN 70* 74* 67* 43* 23 19  CREATININE 1.95* 2.10* 1.89* 1.26* 1.03 1.02  CALCIUM 9.7  --  9.5 9.2 9.7 9.0    Liver Function Tests:  Recent Labs Lab 06/01/14 2300  AST 17  ALT 8  ALKPHOS 68  BILITOT 0.3  PROT 8.1  ALBUMIN 3.2*   No results found for this basename: LIPASE, AMYLASE,  in the last 168 hours  Recent Labs Lab 06/01/14 2300  AMMONIA 28    CBC:  Recent Labs Lab 06/01/14 2300 06/02/14 0121 06/02/14 0445 06/03/14 1820 06/04/14 0230  WBC 14.5*  --  13.8* 19.5* 20.2*  NEUTROABS 10.9*  --   --  15.2*  --   HGB 9.0* 9.9* 8.8* 9.9* 9.1*  HCT 28.6* 29.0* 28.9* 31.8* 29.3*  MCV 89.9  --  90.9 90.6 90.4  PLT 328  --  310 344 342    Cardiac Enzymes: No results found for this basename: CKTOTAL, CKMB, CKMBINDEX, TROPONINI,  in the last 168 hours  Lipid Panel: No results found for this basename: CHOL, TRIG, HDL, CHOLHDL, VLDL, LDLCALC,  in the last 168 hours  CBG:  Recent Labs Lab 06/02/14 2100 06/03/14 0907 06/03/14  1241 06/03/14 1628 06/04/14 0822  GLUCAP 151* 184* 162* 148* 229*    Microbiology: Results for orders placed during the hospital encounter of 06/01/14  URINE CULTURE     Status: None   Collection Time    06/01/14 11:17 PM      Result Value Ref Range Status   Specimen Description URINE, CATHETERIZED   Final   Special Requests NONE   Final   Culture  Setup Time     Final   Value: 06/02/2014 00:26     Performed at [facility] Count     Final   Value: NO GROWTH     Performed at [facility]   Culture     Final   Value: NO GROWTH     Performed at [facility]   Report Status 06/03/2014 FINAL   Final  CULTURE, BLOOD (ROUTINE X 2)      Status: None   Collection Time    06/02/14  2:52 AM      Result Value Ref Range Status   Specimen Description BLOOD LEFT ARM   Final   Special Requests BOTTLES DRAWN AEROBIC AND ANAEROBIC 6CC EA   Final   Culture  Setup Time     Final   Value: 06/02/2014 11:30     Performed at [facility]   Culture     Final   Value:        BLOOD CULTURE RECEIVED NO GROWTH TO DATE CULTURE WILL BE HELD FOR 5 DAYS BEFORE ISSUING A FINAL NEGATIVE REPORT     Performed at [facility]   Report Status PENDING   Incomplete  MRSA PCR SCREENING     Status: None   Collection Time    06/02/14  3:32 AM      Result Value Ref Range Status   MRSA by PCR NEGATIVE  NEGATIVE Final   Comment:            The GeneXpert MRSA Assay (FDA     approved for NASAL specimens     only), is one component of a     comprehensive MRSA colonization     surveillance program. It is not     intended to diagnose MRSA     infection nor to guide or     monitor treatment for     MRSA infections.  CULTURE, BLOOD (ROUTINE X 2)     Status: None   Collection Time    06/02/14  4:45 AM      Result Value Ref Range Status   Specimen Description BLOOD RIGHT ANTECUBITAL   Final   Special Requests BOTTLES DRAWN AEROBIC AND ANAEROBIC 5CC EA   Final   Culture  Setup Time     Final   Value: 06/02/2014 11:30     Performed at [facility]   Culture     Final   Value:        BLOOD CULTURE RECEIVED NO GROWTH TO DATE CULTURE WILL BE HELD FOR 5 DAYS BEFORE ISSUING A FINAL NEGATIVE REPORT     Performed at [facility]   Report Status PENDING   Incomplete    Coagulation Studies: No results found for this basename: LABPROT, INR,  in the last 72 hours  Imaging: Ct Chest W Contrast  06/03/2014   CLINICAL DATA:  Altered mental status. Abdominal pain and back pain.  EXAM: CT CHEST, ABDOMEN, AND PELVIS WITH CONTRAST  TECHNIQUE:  Multidetector CT imaging of the chest, abdomen and pelvis was performed following the standard  protocol during bolus administration of intravenous contrast.  CONTRAST:  100 mL Omnipaque 300  COMPARISON:  CT abdomen and pelvis 04/11/2012  FINDINGS: CT CHEST FINDINGS  Mild cardiac enlargement. Normal caliber thoracic aorta without evidence of dissection. Esophagus is decompressed. No significant lymphadenopathy in the chest. No pleural effusions. Atelectasis in the lung bases. Visualization of lungs is limited due to respiratory motion artifact. No pneumothorax.  CT ABDOMEN AND PELVIS FINDINGS  Abdomen: Mild diffuse fatty infiltration in the liver. Surgical absence of the gallbladder. The pancreas, spleen, adrenal glands, abdominal aorta, inferior vena cava, and retroperitoneal lymph nodes are unremarkable. There is mild pyelocaliectasis and ureterectasis demonstrated on the right. Transition zone in the caliber of the ureter is demonstrated at the level of the sacrum. No obstructing stone is visualized. This could indicate a non radiopaque stone or ureteral stricture. Sub cm parenchymal cysts in the kidneys. No solid masses. Stomach and small bowel are nondistended. Contrast material flows to the cecum without evidence of obstruction. No free air or free fluid in the abdomen.  Pelvis: Nodular enlargement of the uterus consistent with fibroids. No pelvic lymphadenopathy. Bladder is decompressed with a Foley catheter. No free or loculated pelvic fluid collections. Appendix is normal. Diverticula in the sigmoid colon without evidence of diverticulitis.  Degenerative changes throughout the thoracolumbar spine. No destructive bone lesions.  IMPRESSION: Atelectasis in both lung bases.  Mild diffuse fatty infiltration in the liver. Right-sided pyelocaliectasis and ureterectasis with transition zone at the level of the sacrum. No obstructing stone is visualized. This may indicate non radiopaque stone or stricture.   Electronically Signed   By: Lucienne Capers M.D.   On: 06/03/2014 02:17   Mr Brain Wo  Contrast  06/03/2014   CLINICAL DATA:  Altered mental status  EXAM: MRI HEAD WITHOUT CONTRAST  TECHNIQUE: Multiplanar, multiecho pulse sequences of the brain and surrounding structures were obtained without intravenous contrast.  COMPARISON:  CT head 06/01/2014  FINDINGS: Ventricle size is normal. Pituitary normal in size. Mild chronic microvascular ischemic changes in the white matter.  Negative for acute infarct. Negative for hemorrhage or mass. No edema or shift of the midline structures.  Image quality degraded by mild motion.  Mucous retention cysts in the maxillary sinus bilaterally. Mild mucosal edema in the paranasal sinuses. Air-fluid levels in the left frontal, left ethmoid, and left sphenoid sinus.  IMPRESSION: Mild chronic microvascular ischemic changes in the white matter. No acute abnormality.  Sinusitis with air-fluid levels.   Electronically Signed   By: Franchot Gallo M.D.   On: 06/03/2014 09:59   Ct Abdomen Pelvis W Contrast  06/03/2014   CLINICAL DATA:  Altered mental status. Abdominal pain and back pain.  EXAM: CT CHEST, ABDOMEN, AND PELVIS WITH CONTRAST  TECHNIQUE: Multidetector CT imaging of the chest, abdomen and pelvis was performed following the standard protocol during bolus administration of intravenous contrast.  CONTRAST:  100 mL Omnipaque 300  COMPARISON:  CT abdomen and pelvis 04/11/2012  FINDINGS: CT CHEST FINDINGS  Mild cardiac enlargement. Normal caliber thoracic aorta without evidence of dissection. Esophagus is decompressed. No significant lymphadenopathy in the chest. No pleural effusions. Atelectasis in the lung bases. Visualization of lungs is limited due to respiratory motion artifact. No pneumothorax.  CT ABDOMEN AND PELVIS FINDINGS  Abdomen: Mild diffuse fatty infiltration in the liver. Surgical absence of the gallbladder. The pancreas, spleen, adrenal glands, abdominal aorta, inferior vena cava,  and retroperitoneal lymph nodes are unremarkable. There is mild  pyelocaliectasis and ureterectasis demonstrated on the right. Transition zone in the caliber of the ureter is demonstrated at the level of the sacrum. No obstructing stone is visualized. This could indicate a non radiopaque stone or ureteral stricture. Sub cm parenchymal cysts in the kidneys. No solid masses. Stomach and small bowel are nondistended. Contrast material flows to the cecum without evidence of obstruction. No free air or free fluid in the abdomen.  Pelvis: Nodular enlargement of the uterus consistent with fibroids. No pelvic lymphadenopathy. Bladder is decompressed with a Foley catheter. No free or loculated pelvic fluid collections. Appendix is normal. Diverticula in the sigmoid colon without evidence of diverticulitis.  Degenerative changes throughout the thoracolumbar spine. No destructive bone lesions.  IMPRESSION: Atelectasis in both lung bases.  Mild diffuse fatty infiltration in the liver. Right-sided pyelocaliectasis and ureterectasis with transition zone at the level of the sacrum. No obstructing stone is visualized. This may indicate non radiopaque stone or stricture.   Electronically Signed   By: Lucienne Capers M.D.   On: 06/03/2014 02:17    Medications:  I have reviewed the patient's current medications. Scheduled: . benazepril  20 mg Oral Daily  . heparin  5,000 Units Subcutaneous 3 times per day  . insulin aspart  0-5 Units Subcutaneous QHS  . insulin aspart  0-9 Units Subcutaneous TID WC  . metoprolol tartrate  25 mg Oral BID  . senna-docusate  2 tablet Oral BID  . sodium chloride  3 mL Intravenous Q12H    Assessment/Plan: Mental status improved.  Narcotics discontinued.  MRI of the brain reviewed and shows no acute changes.  EEG is unremarkable as well.  Unclear etiology of right arm pain.  Patient localizes the pain to the elbow but reports pain throughout the arm.  Blood sugars have been reasonably controlled making an amyotrophy less likely.      Recommendations: No further neurologic intervention is recommended at this time.  If further questions arise, please call or page at that time.  Thank you for allowing neurology to participate in the care of this patient. Agree with limiting narcotic use.      LOS: 3 days   Alexis Goodell, MD [facility] [phone] 06/04/2014  8:47 AM

## 2014-06-04 NOTE — Progress Notes (Signed)
Family Medicine Teaching Service Daily Progress Note Intern Pager: [phone]  Patient name: [name] Medical record number: [id number] Date of birth: Dec 15, 1950 Age: 63 y.o. Gender: female  Primary Care Provider: Lind Covert, MD Consultants: Neurology Code Status: FULL  Pt Overview and Major Events to Date:  7/5: Patient admitted from ED with AMS 7/7: Mental status back to baseline. Patient states she knows she took more than the prescribed dose for her back pain (suspect: baclifen). Notable edema and pain of Rt Elbow, XR scheduled  Assessment and Plan: [name] is a 63 y.o. female presenting with altered mental status . PMH is significant for DM2, HTN,OA, combined systolic/diastolic CHF, Depression, and Asthma.   Acute encephalopathy  - telemetry  - Unclear etiology, differential diagnosis includes drug-induced, dehydration, metabolic, CVA, infection--highly suspect Drug-Induced - Neuro Consulted (thank you):  Metabolic process?, Brain MRI (no acute pathology), EEG (wnl) - Hemodynamically stable with ABG showing reasonable ventilation, lactic acid 1.1  - Responded slightly to Narcan injection but her UDS was negative  - Tylenol and salicylate level within normal limits, glucose 162, UA negative, ammonia 28  - Blood cultures (neg to date) - Pt found to have significant urinary retention (766ml w/ cath); foley placed - (now lucid) Pt reports taking more than her prescribed dose of medications prior to admission.  Hyperkalemia  - Previous measurement of 5.9 hemolyzed, repeat was 5.3; 7/6 = 4.4 (wnl)  - Hold spironolactone and lisinopril  - Possibly dehydration related   Acute kidney injury  - BUN/creatinine ratio (wnl) - baseline cre 1.0-1.3  - Tacky mucous membranes   DM2  - Previous A1c 6.1 on 12/31/2013  - SSI while admitted  - On metformin at home, hold with AKI and through the rest of admission   HTN  - 150s over 80s during exam  - Hold Lasix,  benazepril, metoprolol, spironolactone  - Hydralazine 5mg  inj. If BP >180/>90  Normocytic anemia  - No obvious source of bleeding  - ?anemia of chronic disease, but no obvious chronic inflammatory process  - monitor, will likely be lower after hydration.   Combined CHF  - Slight edema on exam, tacky mucous membranes, BUN/creatinine ratio remains at or near 35  - Previous echo 2013 with EF 20-25 and grade 2 diastolic dysf, Repeat 30/0923 with EF 55-60 and grade 1 diastolic dysf  - NS given cautiously   Asthma  - Lungs clear exam, albuterol and home Flovent when her mental status improves   OA  - Will discuss pain medications when her mental status improves  - Hold muscle relaxants for now - Elbow pain: suspect Gout; Uric acid (8.3); Started Indocin   FEN/GI: NS @100ml /hr, Senokot-S  PPx: Heparin  Disposition: Home with family after medically cleared  Subjective:  Patient has been experiencing constant, yet waxing/waning in character, delirium. She is constantly repeating words in a disordered chaotic way and is regularly agitated. Interestingly enough when asked direct questions she is completely oriented, and answers questions about her family correctly (per her son who is at her side). Sentences are short and choppy. She regularly complains of pain, specifically Back, Abdominal, and bilateral Foot pain. Foot pain is burning, itching, and extreme in nature. Even the slightest touch seems to cause her discomfort.   Objective: Temp:  [98.4 F (36.9 C)-98.7 F (37.1 C)] 98.6 F (37 C) (07/07 1147) Pulse Rate:  [84-110] 86 (07/07 1147) Resp:  [15-26] 26 (07/07 1147) BP: (161-199)/(85-102) 192/102 mmHg (07/07 1147) SpO2:  [  97 %-100 %] 100 % (07/07 1147) Weight:  [231 lb 14.8 oz (105.2 kg)] 231 lb 14.8 oz (105.2 kg) (07/07 0339) Physical Exam: Gen: Lying in bed, repeating the same word over and over, responds the commands. Oriented x3  HEENT: NCAT, pupils sluggish but slowly  reactive approximately 5 mm to 3 mm, tacky mucous membranes  CV: RRR, good S1/S2, no murmur  Resp: CTABL, no wheezes, non-labored  Abd: Slightley distended non tender, obese, BS present, no guarding or organomegaly  Ext: 1-2+ pitting edema bilaterally to the mid calf; Rt Elbow edematous and erythematous, painful w/ light touch/movement; pulses strong and equal bilaterally. Neuro: As above continually repeating the same name over and over, responds with minimal foot and hand movement intermittently. Significant UE resting tremor noted bilaterally (Rt>Lt)  Laboratory:  Recent Labs Lab 06/02/14 0445 06/03/14 1820 06/04/14 0230  WBC 13.8* 19.5* 20.2*  HGB 8.8* 9.9* 9.1*  HCT 28.9* 31.8* 29.3*  PLT 310 344 342    Recent Labs Lab 06/01/14 2300  06/02/14 2027 06/03/14 1820 06/04/14 0230  NA 142  < > 144 145 142  K 5.9*  6.0*  < > 4.4 4.4 4.5  CL 100  < > 105 105 104  CO2 25  < > 26 24 24   BUN 70*  < > 43* 23 19  CREATININE 1.95*  < > 1.26* 1.03 1.02  CALCIUM 9.7  < > 9.2 9.7 9.0  PROT 8.1  --   --   --   --   BILITOT 0.3  --   --   --   --   ALKPHOS 68  --   --   --   --   ALT 8  --   --   --   --   AST 17  --   --   --   --   GLUCOSE 188*  < > 173* 193* 207*  < > = values in this interval not displayed.    Imaging/Diagnostic Tests: CT Head w/o Contrast IMPRESSION:  No acute intracranial abnormality.  CT Head w/ Contrast IMPRESSION:  Atelectasis in both lung bases.  Mild diffuse fatty infiltration in the liver. Right-sided  pyelocaliectasis and ureterectasis with transition zone at the level  of the sacrum. No obstructing stone is visualized. This may indicate  non radiopaque stone or stricture.  CT Abd/Pelvis w/ Con IMPRESSION:  Atelectasis in both lung bases.  Mild diffuse fatty infiltration in the liver. Right-sided  pyelocaliectasis and ureterectasis with transition zone at the level  of the sacrum. No obstructing stone is visualized. This may indicate  non  radiopaque stone or stricture.  MR Brain IMPRESSION:  Mild chronic microvascular ischemic changes in the white matter. No  acute abnormality.  Sinusitis with air-fluid levels.  XR Rt Elbow IMPRESSION:  Degenerative changes. No fracture or acute finding.    Elberta Leatherwood, MD 06/04/2014, 2:04 PM PGY-1, [place] Intern pager: [phone], text pages welcome

## 2014-06-04 NOTE — Progress Notes (Addendum)
Patient having ongoing c/o right shoulder/arm pain with limited movement and hand weakness. Patient was given morphine 2 mg IV and heat was applied at different times. Patient states interventions did not help. Family Med paged, and order obtained.

## 2014-06-04 NOTE — Progress Notes (Signed)
Patient with 10/10 right elbow pain. Dilaudid 1 mg IV given. Right elbow warm and swollen. Patient unable to move because of intense pain. Family Medicine paged.

## 2014-06-04 NOTE — Progress Notes (Signed)
FMTS Attending Note  I personally saw and evaluated the patient. The plan of care was discussed with the resident team. I agree with the assessment and plan as documented by the resident.   Patient oriented today, she appears in no acute distress  1. Encephalopathy - metabolic workup negative, MRI head negative for CVA, EEG negative, encephalopathy has resolved 2. Back Pain - resolved, MRI lumbar spine discontinues as patient no loner have back pain, no fevers 3. AKI - resolved 4. T2DM - agree with holding Metformin in the context of AKI, continue ISS  5. Combined CHF - stable  6. Peripheral Neuropathy - on gabapentin, foot pain improved today 7. Right elbow pain - xray unremarkable, she does have elevated uric acid which is suggestive of gout, started on Indomethacin 8. Hypertension - likely related to pain, PRN Hydralazine  Dossie Arbour MD

## 2014-06-04 NOTE — Progress Notes (Signed)
This AM is awake but sleepy.  Rec'd narcotic overnight Oriented x 3 Complains of severe right elbow pain.  Can not recall trauma No history of gout  Elbow is warm,  very tender to palpation and range of motion.  Distal grip is 4/5  Back - denies tenderness none with palpation  Mental Status - seems improved nearly back to baseline most consistent with medications continue to hold any muscle relaxers.  Would also try very hard not to use narcotics.     Back pain - resolved?  Would not get MRI now  ELbow pain - ? Cause.  Seems worse than OA flare but possible.  Also gout or trauma.  Check UA and xray.  Treat pain with NSAIDs (watch renal function) and perhaps prednisone if UA is high and gout looks likely  Urine retention - trial with foley out  Decondiditioning - Physical Therapy consult  Thanks  [place]

## 2014-06-05 DIAGNOSIS — I10 Essential (primary) hypertension: Secondary | ICD-10-CM

## 2014-06-05 DIAGNOSIS — E1142 Type 2 diabetes mellitus with diabetic polyneuropathy: Secondary | ICD-10-CM

## 2014-06-05 DIAGNOSIS — M25529 Pain in unspecified elbow: Secondary | ICD-10-CM

## 2014-06-05 DIAGNOSIS — E119 Type 2 diabetes mellitus without complications: Secondary | ICD-10-CM

## 2014-06-05 DIAGNOSIS — M109 Gout, unspecified: Secondary | ICD-10-CM

## 2014-06-05 DIAGNOSIS — E669 Obesity, unspecified: Secondary | ICD-10-CM

## 2014-06-05 DIAGNOSIS — E1149 Type 2 diabetes mellitus with other diabetic neurological complication: Secondary | ICD-10-CM

## 2014-06-05 DIAGNOSIS — G9340 Encephalopathy, unspecified: Secondary | ICD-10-CM

## 2014-06-05 LAB — GLUCOSE, CAPILLARY
GLUCOSE-CAPILLARY: 217 mg/dL — AB (ref 70–99)
GLUCOSE-CAPILLARY: 202 mg/dL — AB (ref 70–99)
Glucose-Capillary: 193 mg/dL — ABNORMAL HIGH (ref 70–99)
Glucose-Capillary: 199 mg/dL — ABNORMAL HIGH (ref 70–99)

## 2014-06-05 MED ORDER — INDOMETHACIN 50 MG PO CAPS: 50.0000 mg | ORAL_CAPSULE | Freq: Two times a day (BID) | ORAL | Status: DC

## 2014-06-05 NOTE — Progress Notes (Signed)
Report called to receiving RN [id number]. Will transfer via Bed.

## 2014-06-05 NOTE — Progress Notes (Signed)
Pt has bed offers from SNFs, including the facility her husband is currently in ([place]). Will inform pt of bed offer tomorrow morning and will facilitate discharge to SNF when pt is deemed medically ready. FL2 is on the chart for MD signature.   Ky Barban, MSW, [facility] Clinical Social Worker [phone]

## 2014-06-05 NOTE — Discharge Instructions (Signed)
Accidental Overdose °A drug overdose occurs when a chemical substance (drug or medication) is used in amounts large enough to overcome a person. This may result in severe illness or death. This is a type of poisoning. Accidental overdoses of medications or other substances come from a variety of reasons. When this happens accidentally, it is often because the person taking the substance does not know enough about what they have taken. Drugs which commonly cause overdose deaths are alcohol, psychotropic medications (medications which affect the mind), pain medications, illegal drugs (street drugs) such as cocaine and heroin, and multiple drugs taken at the same time. It may result from careless behavior (such as over-indulging at a party). Other causes of overdose may include multiple drug use, a lapse in memory, or drug use after a period of no drug use.  °Sometimes overdosing occurs because a person cannot remember if they have taken their medication.  °A common unintentional overdose in young children involves multi-vitamins containing iron. Iron is a part of the hemoglobin molecule in blood. It is used to transport oxygen to living cells. When taken in small amounts, iron allows the body to restock hemoglobin. In large amounts, it causes problems in the body. If this overdose is not treated, it can lead to death. °Never take medicines that show signs of tampering or do not seem quite right. Never take medicines in the dark or in poor lighting. Read the label and check each dose of medicine before you take it. When adults are poisoned, it happens most often through carelessness or lack of information. Taking medicines in the dark or taking medicine prescribed for someone else to treat the same type of problem is a dangerous practice. °SYMPTOMS  °Symptoms of overdose depend on the medication and amount taken. They can vary from over-activity with stimulant over-dosage, to sleepiness from depressants such as  alcohol, narcotics and tranquilizers. Confusion, dizziness, nausea and vomiting may be present. If problems are severe enough coma and death may result. °DIAGNOSIS  °Diagnosis and management are generally straightforward if the drug is known. Otherwise it is more difficult. At times, certain symptoms and signs exhibited by the patient, or blood tests, can reveal the drug in question.  °TREATMENT  °In an emergency department, most patients can be treated with supportive measures. Antidotes may be available if there has been an overdose of opioids or benzodiazepines. A rapid improvement will often occur if this is the cause of overdose. °At home or away from medical care: °· There may be no immediate problems or warning signs in children. °· Not everything works well in all cases of poisoning. °· Take immediate action. Poisons may act quickly. °· If you think someone has swallowed medicine or a household product, and the person is unconscious, having seizures (convulsions), or is not breathing, immediately call for an ambulance. °IF a person is conscious and appears to be doing OK but has swallowed a poison: °· Do not wait to see what effect the poison will have. Immediately call a poison control center (listed in the white pages of your telephone book under "Poison Control" or inside the front cover with other emergency numbers). Some poison control centers have TTY capability for the deaf. Check with your local center if you or someone in your family requires this service. °· Keep the container so you can read the label on the product for ingredients. °· Describe what, when, and how much was taken and the age and condition of the person poisoned.   Describe what, when, and how much was taken and the age and condition of the person poisoned. Inform them if the person is vomiting, choking, drowsy, shows a change in color or temperature of skin, is conscious or unconscious, or is convulsing.   Do not cause vomiting unless instructed by medical personnel. Do not induce vomiting or force liquids into a person who  is convulsing, unconscious, or very drowsy.  Stay calm and in control.    Activated charcoal also is sometimes used in certain types of poisoning and you may wish to add a supply to your emergency medicines. It is available without a prescription. Call a poison control center before using this medication.  PREVENTION   Thousands of children die every year from unintentional poisoning. This may be from household chemicals, poisoning from carbon monoxide in a car, taking their parent's medications, or simply taking a few iron pills or vitamins with iron. Poisoning comes from unexpected sources.   Store medicines out of the sight and reach of children, preferably in a locked cabinet. Do not keep medications in a food cabinet. Always store your medicines in a secure place. Get rid of expired medications.   If you have children living with you or have them as occasional guests, you should have child-resistant caps on your medicine containers. Keep everything out of reach. Child proof your home.   If you are called to the telephone or to answer the door while you are taking a medicine, take the container with you or put the medicine out of the reach of small children.   Do not take your medication in front of children. Do not tell your child how good a medication is and how good it is for them. They may get the idea it is more of a treat.   If you are an adult and have accidentally taken an overdose, you need to consider how this happened and what can be done to prevent it from happening again. If this was from a street drug or alcohol, determine if there is a problem that needs addressing. If you are not sure a problems exists, it is easy to talk to a professional and ask them if they think you have a problem. It is better to handle this problem in this way before it happens again and has a much worse consequence.  Document Released: 01/29/2005 Document Revised: 02/07/2012 Document Reviewed: 07/07/2009  ExitCare  Patient Information 2015 [employer]. This information is not intended to replace advice given to you by your health care provider. Make sure you discuss any questions you have with your health care provider.

## 2014-06-05 NOTE — Progress Notes (Addendum)
Pt transferred from [place] to room 3E21 via bed. Pt alert and oriented x 4. Skin intact. Pt denied pain or concerns. Vital signs WNL. Pt oriented to room and unit and call bell placed in reach. Notified son [name] that pt was moved to new room. Will continue to monitor pt closely. Report received from [place], [place].  Eulis Canner, RN

## 2014-06-05 NOTE — Progress Notes (Signed)
Clinical Social Work Department CLINICAL SOCIAL WORK PLACEMENT NOTE 06/05/2014  Patient:  [name], [name]  Account Number:  [id number] Admit date:  06/01/2014  Clinical Social Worker:  Ky Barban, Latanya Presser  Date/time:  06/05/2014 04:24 PM  Clinical Social Work is seeking post-discharge placement for this patient at the following level of care:   [place]   (*CSW will update this form in Epic as items are completed)   N/A-sent to all SNFs in [facility] and to [facility] with a note stating pt's husband is there  [facility]arrister's clerk provided with [place] Department of Clinical Social Work's list of facilities offering this level of care within the geographic area requested by the patient (or if unable, by the patient's family).  06/05/2014  Patient/family informed of their freedom to choose among providers that offer the needed level of care, that participate in Medicare, Medicaid or managed care program needed by the patient, have an available bed and are willing to accept the patient.  N/A-not sent to this facility  Patient/family informed of [facility]' ownership interest in [facility], as well as of the fact that they are under no obligation to receive care at this facility.  PASARR submitted to EDS on 06/05/2014 PASARR number received on 06/05/2014  FL2 transmitted to all facilities in geographic area requested by pt/family on  06/05/2014 FL2 transmitted to all facilities within larger geographic area on   Patient informed that his/her managed care company has contracts with or will negotiate with  certain facilities, including the following:     Patient/family informed of bed offers received:   Patient chooses bed at  Physician recommends and patient chooses bed at    Patient to be transferred to  on   Patient to be transferred to facility by  Patient and family notified of transfer on  Name of family member notified:    The following physician  request were entered in Epic:   Additional Comments:   Ky Barban, MSW, [place] Social Worker [phone]

## 2014-06-05 NOTE — Progress Notes (Signed)
Read, reviewed, edited and agree with student's findings and recommendations.  Talyn Eddie B. Floy Angert, PT, DPT #[phone]  

## 2014-06-05 NOTE — Progress Notes (Signed)
Clinical Social Work Department BRIEF PSYCHOSOCIAL ASSESSMENT 06/05/2014  Patient:  LISAMARIE, COKE     Account Number:  [id number]     Admit date:  06/01/2014  Clinical Social Worker:  Freeman Caldron  Date/Time:  06/05/2014 04:21 PM  Referred by:  Physician  Date Referred:  06/05/2014 Referred for  SNF Placement   Other Referral:   Interview type:  Patient Other interview type:    PSYCHOSOCIAL DATA Living Status:  FAMILY Admitted from facility:   Level of care:   Primary support name:  Kyiah Canepa ([phone]) Primary support relationship to patient:  CHILD, ADULT Degree of support available:   Good--pt has support from family; husband is at [facility].    CURRENT CONCERNS Current Concerns  Post-Acute Placement   Other Concerns:    SOCIAL WORK ASSESSMENT / PLAN CSW explained referral for SNF; pt states he husband is at [facility]. CSW asked if this would be the facility she would like to go to if they can offer a bed, since her husband is there. Pt nodded. [facility] is able to offer a bed. Will inform pt tomorrow and will facilitate discharge to facility if she is medically ready.   Assessment/plan status:  Psychosocial Support/Ongoing Assessment of Needs Other assessment/ plan:   Information/referral to community resources:   SNF ([facility])    PATIENT'S/FAMILY'S RESPONSE TO PLAN OF CARE: Good--pt drowsy but engaged in conversation with CSW. Understands CSW role.       Ky Barban, MSW, [facility] Clinical Social Worker [phone]

## 2014-06-05 NOTE — Progress Notes (Addendum)
Family Medicine Teaching Service Daily Progress Note Intern Pager: [phone]  Patient name: [name] Medical record number: [id number] Date of birth: July 22, 1951 Age: 63 y.o. Gender: female  Primary Care Provider: Lind Covert, MD Consultants: Neurology Code Status: FULL  Pt Overview and Major Events to Date:  7/5: Patient admitted from ED with AMS 7/7: Mental status back to baseline. Patient states she knows she took more than the prescribed dose for her back pain (suspect: baclifen). Notable edema and pain of Rt Elbow, XR scheduled 7/8: Patient continues to be back to her baseline mentally. Orders to transfer her out of stepdown and to telemetry have been placed. Consideration to DC patient were made but patient is apparently too deconditioned at present.   Assessment and Plan: [name] is a 63 y.o. female presenting with altered mental status . PMH is significant for DM2, HTN,OA, combined systolic/diastolic CHF, Depression, and Asthma.   Acute encephalopathy  - telemetry  - Unclear etiology, differential diagnosis includes drug-induced, dehydration, metabolic, CVA, infection--highly suspect Drug-Induced - Neuro Consulted (thank you):  Metabolic process?, Brain MRI (no acute pathology), EEG (wnl) - Hemodynamically stable with ABG showing reasonable ventilation, lactic acid 1.1  - Responded slightly to Narcan injection but her UDS was negative  - Tylenol and salicylate level within normal limits, glucose 162, UA negative, ammonia 28  - Blood cultures (neg to date) - Pt found to have significant urinary retention (748ml w/ cath); foley placed - (now lucid) Pt reports taking more than her prescribed dose of medications prior to admission.  Hyperkalemia  - Previous measurement of 5.9 hemolyzed, repeat was 5.3; 7/6 = 4.4 (wnl)  - Hold spironolactone and lisinopril  - Possibly dehydration related   Acute kidney injury  - BUN/creatinine ratio (wnl) - baseline cre  1.0-1.3  - Tacky mucous membranes   DM2  - Previous A1c 6.1 on 12/31/2013  - SSI while admitted  - On metformin at home, hold with AKI and through the rest of admission   HTN  - 150s over 80s during exam  - Hold Lasix, benazepril, metoprolol, spironolactone  - Hydralazine 5mg  inj. If BP >180/>90  Normocytic anemia  - No obvious source of bleeding  - ?anemia of chronic disease, but no obvious chronic inflammatory process  - monitor, will likely be lower after hydration.   Combined CHF  - Slight edema on exam, tacky mucous membranes, BUN/creatinine ratio remains at or near 35  - Previous echo 2013 with EF 20-25 and grade 2 diastolic dysf, Repeat 40/8144 with EF 55-60 and grade 1 diastolic dysf  - NS given cautiously   Asthma  - Lungs clear exam, albuterol and home Flovent when her mental status improves   OA  - Will discuss pain medications when her mental status improves  - Hold muscle relaxants for now - Elbow pain: suspect Gout; Uric acid (8.3); Started Indocin 50mg  BID   FEN/GI: NS @100ml /hr, Senokot-S  PPx: Heparin  Disposition: Home with family after medically cleared  Subjective:  Patient has been experiencing constant, yet waxing/waning in character, delirium. She is constantly repeating words in a disordered chaotic way and is regularly agitated. Interestingly enough when asked direct questions she is completely oriented, and answers questions about her family correctly (per her son who is at her side). Sentences are short and choppy. She regularly complains of pain, specifically Back, Abdominal, and bilateral Foot pain. Foot pain is burning, itching, and extreme in nature. Even the slightest touch seems to cause  her discomfort.   Objective: Temp:  [99 F (37.2 C)-99.7 F (37.6 C)] 99.4 F (37.4 C) (07/08 1133) Pulse Rate:  [92-108] 108 (07/08 0735) Resp:  [20-25] 21 (07/08 1133) BP: (129-174)/(64-100) 138/64 mmHg (07/08 1133) SpO2:  [96 %-100 %] 98 % (07/08  1133) Weight:  [232 lb 2.3 oz (105.3 kg)] 232 lb 2.3 oz (105.3 kg) (07/08 0444) Physical Exam: Gen: Lying in bed, responds the commands. Oriented x3, no acute distress HEENT: NCAT, MMM, PERRLA CV: RRR, good S1/S2, no murmur  Resp: CTABL, no wheezes, non-labored  Abd: Slightley distended non tender, obese, BS present, no guarding or organomegaly  Ext: 1+ pitting edema bilaterally to the mid calf; Rt Elbow edematous and erythematous, painful w/ light touch/movement; pulses strong and equal bilaterally. Neuro: Previous tremor no longer present. CN2-12 intact.   Laboratory:  Recent Labs Lab 06/02/14 0445 06/03/14 1820 06/04/14 0230  WBC 13.8* 19.5* 20.2*  HGB 8.8* 9.9* 9.1*  HCT 28.9* 31.8* 29.3*  PLT 310 344 342    Recent Labs Lab 06/01/14 2300  06/02/14 2027 06/03/14 1820 06/04/14 0230  NA 142  < > 144 145 142  K 5.9*  6.0*  < > 4.4 4.4 4.5  CL 100  < > 105 105 104  CO2 25  < > 26 24 24   BUN 70*  < > 43* 23 19  CREATININE 1.95*  < > 1.26* 1.03 1.02  CALCIUM 9.7  < > 9.2 9.7 9.0  PROT 8.1  --   --   --   --   BILITOT 0.3  --   --   --   --   ALKPHOS 68  --   --   --   --   ALT 8  --   --   --   --   AST 17  --   --   --   --   GLUCOSE 188*  < > 173* 193* 207*  < > = values in this interval not displayed.    Imaging/Diagnostic Tests: CT Head w/o Contrast IMPRESSION:  No acute intracranial abnormality.  CT Head w/ Contrast IMPRESSION:  Atelectasis in both lung bases.  Mild diffuse fatty infiltration in the liver. Right-sided  pyelocaliectasis and ureterectasis with transition zone at the level  of the sacrum. No obstructing stone is visualized. This may indicate  non radiopaque stone or stricture.  CT Abd/Pelvis w/ Con IMPRESSION:  Atelectasis in both lung bases.  Mild diffuse fatty infiltration in the liver. Right-sided  pyelocaliectasis and ureterectasis with transition zone at the level  of the sacrum. No obstructing stone is visualized. This may  indicate  non radiopaque stone or stricture.  MR Brain IMPRESSION:  Mild chronic microvascular ischemic changes in the white matter. No  acute abnormality.  Sinusitis with air-fluid levels.  XR Rt Elbow IMPRESSION:  Degenerative changes. No fracture or acute finding.    Elberta Leatherwood, MD 06/05/2014, 1:49 PM PGY-1, [place] Intern pager: [phone], text pages welcome

## 2014-06-05 NOTE — Progress Notes (Addendum)
Physical Therapy Evaluation Patient Details Name: [name] MRN: [id number] DOB: [birth year] Today's Date: 06/05/2014   History of Present Illness  63 y.o. F admitted 06/01/14 with AMS, possible acute encephalopathy of unknown origin, and questionable gout flare in R elbow and feet. Pt also c/o pain in lower back and abd. Neg CT and MRI shows "no acute abnormality." PMHx includes DM2. HTN, OA, combined systolic and diastolic CHF, depression, asthma, and bilateral knee replacement.  Clinical Impression  Patient session limited due to pain in feet. Pt total assist today with lift from chair to bedside commode to bed. Pt would benefit from CIR to increase strength and functional independence before returning home with supervision of granddaughter for the summer. PT to follow acutely for below listed deficits.    Follow Up Recommendations CIR       Recommendations for Other Services OT consult     Precautions / Restrictions Precautions Precautions: Fall Restrictions Weight Bearing Restrictions: No      Mobility  Bed Mobility Overal bed mobility: Needs Assistance Bed Mobility: Sit to Supine;Rolling Rolling: Min assist     Sit to supine: Total assist;+2 for safety/equipment   General bed mobility comments: Used lift to move patient from chair to bedside commode to bed  Transfers Overall transfer level: Needs assistance Equipment used: Ambulation equipment used             General transfer comment: Used lift to transfer patient from chair to bedside commode to bed     Balance Overall balance assessment: Needs assistance Sitting-balance support: Single extremity supported;Bilateral upper extremity supported;Feet unsupported;Feet supported Sitting balance-Leahy Scale: Fair Sitting balance - Comments: Patient used single and bilateral UE for support during transfer. Did not like placing feet on the floor due to pain. Postural control: Posterior lean (Due to LBP. Grimaced  when lowering legs.)                                   Pertinent Vitals/Pain See vitals flow sheet.     Home Living Family/patient expects to be discharged to:: Private residence Living Arrangements: Other relatives (Granddaughter) Available Help at Discharge: Other (Comment) (Granddaughter for the summer) Type of Home: House Home Access: Ramped entrance     Home Layout: One level Home Equipment: Environmental consultant - 2 wheels Additional Comments: Pt uses ramped entrance and has granddaughter who just graduated from [employer] who is with her and available per patient "sometimes" during this summer    Prior Function Level of Independence: Independent                  Extremity/Trunk Assessment   Upper Extremity Assessment: Defer to OT evaluation           Lower Extremity Assessment: Generalized weakness (Not able to assess due to pain in feet)         Communication   Communication: No difficulties  Cognition Arousal/Alertness: Awake/alert Behavior During Therapy: WFL for tasks assessed/performed Overall Cognitive Status: Within Functional Limits for tasks assessed (Not specifically assessed)                            PT Assessment Patient needs continued PT services  PT Diagnosis Difficulty walking;Generalized weakness;Acute pain   PT Problem List Decreased strength;Decreased range of motion;Decreased activity tolerance;Decreased balance;Decreased mobility;Decreased knowledge of use of DME;Decreased knowledge of precautions;Impaired sensation;Pain;Obesity  PT Treatment Interventions  DME instruction;Gait training;Functional mobility training;Therapeutic activities;Therapeutic exercise;Modalities   PT Goals (Current goals can be found in the Care Plan section) Acute Rehab PT Goals PT Goal Formulation: With patient Time For Goal Achievement: 06/19/14 Potential to Achieve Goals: Good    Frequency Min 4X/week    End of Session Equipment Utilized  During Treatment: Gait belt;Other (comment) (Lift) Activity Tolerance: Patient limited by pain Patient left: in bed;with call bell/phone within reach;with bed alarm set Nurse Communication: Mobility status;Need for lift equipment         Time: 1140-1213 PT Time Calculation (min): 33 min   Charges:   PT Evaluation $Initial PT Evaluation Tier I: 1 Procedure PT Treatments $Therapeutic Activity: 8-22 mins   PT G CodesEber Smith, SPT [phone]

## 2014-06-05 NOTE — Progress Notes (Signed)
FMTS Attending Note  I personally saw and evaluated the patient. The plan of care was discussed with the resident team. I agree with the assessment and plan as documented by the resident.   Patient is alert and oriented X3 today, she is able to have a coherent discussion about her current medical issues. Left elbow pain is improved from yesterday, now able to move the elbow, per nursing staff PT attempted to work with her, she was unable to stand due to bilateral feet pain/weakness, they required the use of a hoyer lift to her her in and out of bed.  1. Encephalopathy - resolved, likely related to Baclofen overdose 2. Back Pain - resolved 3. AKI - resolved  4. T2DM - blood sugars in high 100's and low 200's, restart home metformin as AKI is resolved 5. Combined CHF - stable  6. Peripheral Neuropathy - on gabapentin, foot pain limiting therapy with PT 7. Right elbow pain - xray unremarkable, she does have elevated uric acid which is suggestive of gout, started on Indomethacin which is improving her symptoms 8. Hypertension - likely related to pain, improved in past 24 hours  Disposition - patient is showing clinical improvement however continues to have generalized weakness, will have PT continue to work with her, I have discussed with the patient the option of possibly going to a SNF for a short time for rehab prior to returning home. She was agreeable with this plan. Anticipate discharge in next 24-48 hours.   Dossie Arbour MD

## 2014-06-05 NOTE — Progress Notes (Signed)
Rehab Admissions Coordinator Note:  Patient was screened by Retta Diones for appropriateness for an Inpatient Acute Rehab Consult.  At this time, we are recommending [facility] therapies if patient progresses.  Noted PT recommending inpatient rehab consult.  Patient has Astronomer.  [facility] will not authorize acute inpatient rehab given current diagnosis.  Call me for questions.    Retta Diones 06/05/2014, 2:47 PM  I can be reached at [phone].

## 2014-06-06 DIAGNOSIS — R5381 Other malaise: Secondary | ICD-10-CM

## 2014-06-06 DIAGNOSIS — I5022 Chronic systolic (congestive) heart failure: Secondary | ICD-10-CM

## 2014-06-06 DIAGNOSIS — I509 Heart failure, unspecified: Secondary | ICD-10-CM

## 2014-06-06 DIAGNOSIS — R5383 Other fatigue: Secondary | ICD-10-CM

## 2014-06-06 LAB — GLUCOSE, CAPILLARY
Glucose-Capillary: 225 mg/dL — ABNORMAL HIGH (ref 70–99)
Glucose-Capillary: 254 mg/dL — ABNORMAL HIGH (ref 70–99)

## 2014-06-06 NOTE — Progress Notes (Signed)
PCP Note  [name] is awake alert and back to normal mental status  She is better able to sit up and move R arm but is too weak to stand  Agree with rehab   Consider stopping hydralazine given her relatively low blood pressure now.  Could restart low dose lasix as she has history of CHF and does seem to have some edema in her feet  Thanks for the great care of the in patient team  I will be happy to follow up with her in the office

## 2014-06-06 NOTE — Progress Notes (Signed)
UR completed Quandarius Nill K. Charise Leinbach, RN, BSN, MSHL, CCM  06/06/2014 10:14 PM

## 2014-06-06 NOTE — Care Management Note (Addendum)
  Page 1 of 1   06/06/2014     10:08:56 PM CARE MANAGEMENT NOTE 06/06/2014  Patient:  [name], [name]   Account Number:  [id number]  Date Initiated:  06/06/2014  Documentation initiated by:  Juliane Guest  Subjective/Objective Assessment:   Acute encephalopathy     Action/Plan:   CM to follow for disposition needs   Anticipated DC Date:  06/06/2014   Anticipated DC Plan:  SKILLED NURSING FACILITY  In-house referral  Clinical Social Worker      DC Planning Services  CM consult      [facility] Choice  NA   Choice offered to / List presented to:             Status of service:  Completed, signed off Medicare Important Message given?   (If response is "NO", the following Medicare IM given date fields will be blank) Date Medicare IM given:   Medicare IM given by:   Date Additional Medicare IM given:   Additional Medicare IM given by:    Discharge Disposition:  [place]  Per UR Regulation:  Reviewed for med. necessity/level of care/duration of stay  If discussed at [place] of Stay Meetings, dates discussed:   06/06/2014    Comments:  Mariann Laster RN, BSN, MSHL, CCM  Nurse - Case Manager,  ([facility])  [phone]  06/06/2014 Transfer received from [place] to [place]:  SNF - [facility]) (patients husband is also a patient at [name]. (SW active)

## 2014-06-06 NOTE — Progress Notes (Signed)
Inpatient Diabetes Program Recommendations  AACE/ADA: New Consensus Statement on Inpatient Glycemic Control (2013)  Target Ranges:  Prepandial:   less than 140 mg/dL      Peak postprandial:   less than 180 mg/dL (1-2 hours)      Critically ill patients:  140 - 180 mg/dL    Results for MESHIA, RAU (MRN [id number]) as of 06/06/2014 11:45  Ref. Range 06/04/2014 16:48 06/04/2014 22:41 06/05/2014 07:43 06/05/2014 11:40 06/05/2014 16:28 06/05/2014 21:35 06/06/2014 06:40 06/06/2014 11:15  Glucose-Capillary Latest Range: 70-99 mg/dL 163 (H) 212 (H) 193 (H) 217 (H) 202 (H) 199 (H) 225 (H) 254 (H)  Reason for assessment: elevated CBG Diabetes history: Type 2 Outpatient Diabetes medications: Glucophage 500mg  bid Current orders for Inpatient glycemic control: Novolog sensitive correction 0-9units TID and 0-5 units QHS  Based on current blood sugars please consider adding basal insulin, Lantus 20 units (103kg x 0.2)  QHS   Gentry Fitz, RN, BA, [place], CDE Diabetes Coordinator Inpatient Diabetes Program  [phone] (Team Pager) [phone] [facility] Office) 06/06/2014 11:56 AM

## 2014-06-06 NOTE — Discharge Summary (Signed)
FMTS Attending Note  I personally saw and evaluated the patient. The plan of care was discussed with the resident team. I agree with the assessment and plan as documented by the resident.   Patient had improvement of foot pain on the day of admission, she is amenble to SNF for acute stay rehab.   Dossie Arbour MD

## 2014-06-06 NOTE — Progress Notes (Signed)
Pt transferred to [place] placed on chart yesterday and CSW text-paged MD informing that pt has bed offer and can discharge today. CSW provided handoff to 3E unit CSW updating her. Plan is for 3E CSW to inform pt of bed offer and facilitate discharge today. This CSW signing off.   Ky Barban, MSW, [facility] Clinical Social Worker [phone]

## 2014-06-06 NOTE — Clinical Social Work Placement (Signed)
     Clinical Social Work Department CLINICAL SOCIAL WORK PLACEMENT NOTE 06/06/2014  Patient:  [name], [name]  Account Number:  [id number] Admit date:  06/01/2014  Clinical Social Worker:  Ky Barban, Latanya Presser  Date/time:  06/05/2014 04:24 PM  Clinical Social Work is seeking post-discharge placement for this patient at the following level of care:   SKILLED NURSING   (*CSW will update this form in Epic as items are completed)     Patient/family provided with [place] Department of Clinical Social Works list of facilities offering this level of care within the geographic area requested by the patient (or if unable, by the patients family).  06/05/2014  Patient/family informed of their freedom to choose among providers that offer the needed level of care, that participate in Medicare, Medicaid or managed care program needed by the patient, have an available bed and are willing to accept the patient.    Patient/family informed of [facility] ownership interest in [facility], as well as of the fact that they are under no obligation to receive care at this facility.  PASARR submitted to EDS on 06/05/2014 PASARR number received on 06/05/2014  FL2 transmitted to all facilities in geographic area requested by pt/family on  06/05/2014 FL2 transmitted to all facilities within larger geographic area on   Patient informed that his/her managed care company has contracts with or will negotiate with  certain facilities, including the following:   UHC-Medicare Complete     Patient/family informed of bed offers received:  06/05/2014 Patient chooses bed at [facility] Physician recommends and patient chooses bed at    Patient to be transferred to [facility] on  06/06/2014 Patient to be transferred to facility by Ambulance [facility]y [facility]) Patient and family notified of transfer on 06/06/2014 Name of family member notified:  Tyson Alias  - Son  The  following physician request were entered in Epic: Physician Request  Please prepare priority discharge summary, including medications  Please sign FL2.    Additional Comments: 06/06/14 OK per MD for d/c today to SNF- Facility of choice was provided per patient/son and they were very happy with this plan.  Hopefully patient will only require short term SNF and will be able to return home per her wishes.  RNCM called report.  CSW signing off.

## 2014-06-06 NOTE — Discharge Summary (Signed)
[facility] Discharge Summary  Patient name: [name] Medical record number: [id number] Date of birth: [birth year] Age: 63 y.o. Gender: female Date of Admission: 06/01/2014  Date of Discharge: 06/06/2014  Admitting Physician: Dickie La, MD  Primary Care Provider: Lind Covert, MD Consultants: Neurology  Indication for Hospitalization: AMS  Discharge Diagnoses/Problem List:  - Drug-Induced encephalopathy with AMS - Hyperkalemia - Acute Kidney Injury - DM2 - HTN - Normocytic anemia - Combined CHF - Asthma - OA - Elbow Pain; suspect gout  Disposition: SNF ([facility])  Discharge Condition: Stable  Discharge Exam:  Gen: Lying in bed, responds the commands. Oriented x3, no acute distress  HEENT: NCAT, MMM, PERRLA  CV: RRR, good S1/S2, no murmur  Resp: CTABL, no wheezes, non-labored  Abd: not distended, non tender, obese, BS present, no guarding or organomegaly  Ext: 1+ pitting edema bilaterally to the mid calf; Rt Elbow edematous and erythematous, painful w/ touch/movement (improved from yesterday); pulses strong and equal bilaterally.  Neuro: Previous tremor no longer present. CN2-12 intact.    Brief Hospital Course:  Acute encephalopathy  - Neuro Consulted - Suspect drug induced (bacliphen) -- Patient admitted to taking more than prescribed on 7/7 - Responded slightly to Narcan injection but her UDS was negative  - Tylenol and salicylate level within normal limits, glucose 162, UA negative, ammonia 28  - Blood cultures (neg)  - Pt found to have significant urinary retention (727ml w/ cath); foley placed   Acute kidney injury  - BUN/creatinine ratio (of 35 at highest point; currently wnl) - baseline cre 1.0-1.3   DM2  - Previous A1c 6.1 on 12/31/2013  - SSI while admitted  - On metformin at home, held with AKI through admission   HTN  - kept stable w/ Hydralazine while w/ AMS - DC'd on home  meds  Normocytic anemia  - No obvious source of bleeding  - Hgb 9.1 at DC  Combined CHF  - Slight edema on exam, tacky mucous membranes, BUN/creatinine ratio at or near 35 until AMS resolved - Previous echo 2013 with EF 20-25 and grade 2 diastolic dysf, Repeat 86/5784 with EF 55-60 and grade 1 diastolic dysf  - NS given cautiously   Asthma  - Lungs clear exam, albuterol and home Flovent when her mental status improved  OA  - Held muscle relaxants  Elbow pain: suspect Gout - Uric acid (8.3); Started Indocin 50mg  BID x 10days (DC'd with script)   Issues for Follow Up:  - Discoloration to urine noted by nursing staff on day of DC; did not complain of dysuria/pelvic pain/flank pain. Consider UA at SNF    Significant Procedures:  EEG Impression: This awake and drowsy EEG is normal. The patient is noted to be  confused, vocalizing repeatedly, with jaw tremors, side-to-side  head movements, gagging, with no associated epileptiform  discharges.    Significant Labs and Imaging:   Recent Labs Lab 06/02/14 0445 06/03/14 1820 06/04/14 0230  WBC 13.8* 19.5* 20.2*  HGB 8.8* 9.9* 9.1*  HCT 28.9* 31.8* 29.3*  PLT 310 344 342    Recent Labs Lab 06/01/14 2300 06/02/14 0121 06/02/14 0445 06/02/14 2027 06/03/14 1820 06/04/14 0230  NA 142 142 144 144 145 142  K 5.9*  6.0* 5.3 5.1 4.4 4.4 4.5  CL 100 108 103 105 105 104  CO2 25  --  25 26 24 24   GLUCOSE 188* 180* 178* 173* 193* 207*  BUN 70* 74*  67* 43* 23 19  CREATININE 1.95* 2.10* 1.89* 1.26* 1.03 1.02  CALCIUM 9.7  --  9.5 9.2 9.7 9.0  ALKPHOS 68  --   --   --   --   --   AST 17  --   --   --   --   --   ALT 8  --   --   --   --   --   ALBUMIN 3.2*  --   --   --   --   --    CXR 2 view (7/5) IMPRESSION:  Cardiomegaly with vascular congestion.  Left basilar atelectasis or consolidation. Cannot exclude pneumonia.  CT Head w/o contrast (7/5) IMPRESSION:  No acute intracranial abnormality.  CT Chest W/  Contrast (7/6) IMPRESSION:  Atelectasis in both lung bases.  CT Abd Pelvis W/ Contrast (7/6) IMPRESSION:  Mild diffuse fatty infiltration in the liver. Right-sided  pyelocaliectasis and ureterectasis with transition zone at the level  of the sacrum. No obstructing stone is visualized. This may indicate  non radiopaque stone or stricture.  MR Brain w/o Contrast (7/6) IMPRESSION:  Mild chronic microvascular ischemic changes in the white matter. No  acute abnormality.  Sinusitis with air-fluid levels.  XR Elbow 2 view (7/7) IMPRESSION:  Degenerative changes. No fracture or acute finding.   Results/Tests Pending at Time of Discharge: none  Discharge Medications:    Medication List    STOP taking these medications       baclofen 10 MG tablet  Commonly known as:  LIORESAL      TAKE these medications       acetaminophen 500 MG tablet  Commonly known as:  TYLENOL  Take 1,000 mg by mouth every 6 (six) hours as needed for pain.     albuterol 108 (90 BASE) MCG/ACT inhaler  Commonly known as:  PROVENTIL HFA;VENTOLIN HFA  Inhale 2 puffs into the lungs every 6 (six) hours as needed for wheezing or shortness of breath.     aspirin 81 MG tablet  Take 81 mg by mouth daily.     benazepril 20 MG tablet  Commonly known as:  LOTENSIN  Take 1 tablet (20 mg total) by mouth daily.     fish oil-omega-3 fatty acids 1000 MG capsule  Take 1 g by mouth 2 (two) times daily.     fluticasone 110 MCG/ACT inhaler  Commonly known as:  FLOVENT HFA  Inhale 2 puffs into the lungs 2 (two) times daily.     furosemide 40 MG tablet  Commonly known as:  LASIX  Take 80 mg by mouth daily.     gabapentin 300 MG capsule  Commonly known as:  NEURONTIN  Take 600 mg by mouth 3 (three) times daily.     ibuprofen 200 MG tablet  Commonly known as:  ADVIL,MOTRIN  Take 200-400 mg by mouth every 6 (six) hours as needed for fever, headache, mild pain, moderate pain or cramping.     indomethacin 50 MG  capsule  Commonly known as:  INDOCIN  Take 1 capsule (50 mg total) by mouth 2 (two) times daily with a meal.     metFORMIN 500 MG tablet  Commonly known as:  GLUCOPHAGE  Take 0.5 tablets (250 mg total) by mouth 2 (two) times daily with a meal.     metoprolol tartrate 25 MG tablet  Commonly known as:  LOPRESSOR  Take 25 mg by mouth 2 (two) times daily.     ONE TOUCH  ULTRA TEST test strip  Generic drug:  glucose blood  TEST BLOOD GLUCOSE ONCE DAILY     onetouch ultrasoft lancets  TEST ONCE DAILY AS DIRECTED     ranitidine 150 MG tablet  Commonly known as:  ZANTAC  Take 150 mg by mouth at bedtime.     spironolactone 25 MG tablet  Commonly known as:  ALDACTONE  Take 25 mg by mouth daily.     trolamine salicylate 10 % cream  Commonly known as:  ASPERCREME  Apply 1 application topically daily. For shoulder and hip pain     UROCIT-K 10 10 MEQ (1080 MG) SR tablet  Generic drug:  potassium citrate  Take 10 mEq by mouth 2 (two) times daily.        Discharge Instructions: Please refer to Patient Instructions section of EMR for full details.  Patient was counseled important signs and symptoms that should prompt return to medical care, changes in medications, dietary instructions, activity restrictions, and follow up appointments.   Follow-Up Appointments: Follow-up Information   Follow up with Lind Covert, MD On 06/18/2014. (@9 :15 am spoke with Kennyth Lose )    Specialty:  Family Medicine   Contact information:   [place] [address] [phone]       Call in 2 weeks to follow up.      Elberta Leatherwood, MD 06/06/2014, 2:30 PM PGY-1, [place]

## 2014-06-06 NOTE — Progress Notes (Signed)
Patient has been calm throughout the night.  Uses bedpan.  Noticed some darker colored urine with a slight smell.   Patient asked for tylenol for right arm pain during the night.  Pain has decreased.

## 2014-06-06 NOTE — Progress Notes (Signed)
Report called to [facility] and given to [place], [place].

## 2014-06-08 LAB — CULTURE, BLOOD (ROUTINE X 2)
CULTURE: NO GROWTH
CULTURE: NO GROWTH

## 2014-06-18 ENCOUNTER — Ambulatory Visit: Payer: PRIVATE HEALTH INSURANCE | Admitting: Family Medicine

## 2014-06-21 ENCOUNTER — Ambulatory Visit: Payer: Medicare Other

## 2014-06-25 ENCOUNTER — Other Ambulatory Visit: Payer: Self-pay | Admitting: Family Medicine

## 2014-07-03 ENCOUNTER — Telehealth: Payer: Self-pay | Admitting: Family Medicine

## 2014-07-03 DIAGNOSIS — M1990 Unspecified osteoarthritis, unspecified site: Secondary | ICD-10-CM

## 2014-07-03 NOTE — Telephone Encounter (Signed)
Pt have been discharged from [facility]

## 2014-07-05 ENCOUNTER — Telehealth: Payer: Self-pay | Admitting: Family Medicine

## 2014-07-05 NOTE — Telephone Encounter (Signed)
[name] need a referral to her foot doctor.  Please call her when one have been processed

## 2014-07-05 NOTE — Telephone Encounter (Signed)
Gami called from [facility] to inform the doctor that they are faxing over orders for a few more weeks of care. If you have any questions please call Gami at [phone]. Blima Rich

## 2014-07-12 ENCOUNTER — Ambulatory Visit (INDEPENDENT_AMBULATORY_CARE_PROVIDER_SITE_OTHER): Payer: PRIVATE HEALTH INSURANCE | Admitting: Family Medicine

## 2014-07-12 ENCOUNTER — Telehealth: Payer: Self-pay | Admitting: Family Medicine

## 2014-07-12 ENCOUNTER — Encounter: Payer: Self-pay | Admitting: Family Medicine

## 2014-07-12 VITALS — BP 109/70 | HR 71 | Temp 98.4°F | Wt 220.9 lb

## 2014-07-12 DIAGNOSIS — M25539 Pain in unspecified wrist: Secondary | ICD-10-CM

## 2014-07-12 DIAGNOSIS — E119 Type 2 diabetes mellitus without complications: Secondary | ICD-10-CM

## 2014-07-12 DIAGNOSIS — I10 Essential (primary) hypertension: Secondary | ICD-10-CM

## 2014-07-12 DIAGNOSIS — M722 Plantar fascial fibromatosis: Secondary | ICD-10-CM

## 2014-07-12 DIAGNOSIS — M25531 Pain in right wrist: Secondary | ICD-10-CM | POA: Insufficient documentation

## 2014-07-12 DIAGNOSIS — Z79899 Other long term (current) drug therapy: Secondary | ICD-10-CM

## 2014-07-12 LAB — POCT GLYCOSYLATED HEMOGLOBIN (HGB A1C): HEMOGLOBIN A1C: 7.1

## 2014-07-12 NOTE — Assessment & Plan Note (Signed)
Optimally controlled.

## 2014-07-12 NOTE — Assessment & Plan Note (Signed)
Medication list reviewed and compared with her medication bottles. New medication from outside provider is Vicodin which she uses as needed for pain. Other medications discussed with patient including indication for use. Patient advised to use as instructed. She verbalized understanding.

## 2014-07-12 NOTE — Assessment & Plan Note (Signed)
A1C increased from 6.1 to 7.1 7 seem to be a reasonable number for her due to age and co-morbid factors. At this time suggested continuing Metformin 250 mg BID. F/U in 3 months for repeat A1C if worsen she is advised to increase Metformin to 500 mg BID. In the interim continue home glucose monitoring. She and her daughter agreed with plan and verbalized understanding.

## 2014-07-12 NOTE — Telephone Encounter (Signed)
Order placed Please notify her thanks

## 2014-07-12 NOTE — Assessment & Plan Note (Signed)
Xray of her wrist done reviewed:  OA vs previous traumatic injury. It showed: Widening of scapholunate space suggesting ligamentous injury. PT recommended but she stated she already had that done at rehab. Patient might benefit from MRI of wrist joint to better evaluate for ligamentous injury. Home exercise instruction given. Continue pain medicine prn. If pain does not improve she is advised follow up for MRI. Continue wrist pain as well. She verbalized understanding and agreed with plan.

## 2014-07-12 NOTE — Progress Notes (Signed)
Subjective:     Patient ID: [name], female   DOB: 08-11-1951, 63 y.o.   MRN: [id number]  HPI DM: Currently on Metformin 250 mg BID, denies hypoglycemic or hyperglycemic episode. HTN: She is compliant  Lotensin 20 mg qd,Aldactone 25 mg qd and Metoprolol 25 mg BID, denies any S/E to her medication. Here for follow up. Medication management: here to review her medications to ensure she takes them correctly. She was recently admitted to the hospital for overdosing on her medication. Was transferred to rehad and was discharged few days ago she stated. Hand pain: 1 yr ago while trying to help husband at home she twisted her right wrist.Since then she has had right wrist pain on and off. This current pain has been on going for about 1 month, she also gets shakes in her hand at times. Pain is about 7/10 in severity, worse with weight bearing or pressure. She wears wrist band on and off at home. Uses Vicodin for pain. She stated she had PT already at rehab. Feet pain: C/O B/L feet pain since rehab,mostly on the plantar surface while she is walking, pain is about 7/10 in severity, worse with weight bearing, denies any recent fall. Pain is aching in nature, a little swollen, but no redness. Denies any trauma or injury. Pain is constant and relieved with rest.  Current Outpatient Prescriptions on File Prior to Visit  Medication Sig Dispense Refill  . acetaminophen (TYLENOL) 500 MG tablet Take 1,000 mg by mouth every 6 (six) hours as needed for pain.      Marland Kitchen albuterol (PROVENTIL HFA;VENTOLIN HFA) 108 (90 BASE) MCG/ACT inhaler Inhale 2 puffs into the lungs every 6 (six) hours as needed for wheezing or shortness of breath.      Marland Kitchen aspirin 81 MG tablet Take 81 mg by mouth daily.      . benazepril (LOTENSIN) 20 MG tablet Take 1 tablet (20 mg total) by mouth daily.  90 tablet  3  . fish oil-omega-3 fatty acids 1000 MG capsule Take 1 g by mouth 2 (two) times daily.      . fluticasone (FLOVENT HFA) 110 MCG/ACT  inhaler Inhale 2 puffs into the lungs 2 (two) times daily.  12 g  6  . furosemide (LASIX) 40 MG tablet Take 80 mg by mouth daily.      Marland Kitchen gabapentin (NEURONTIN) 300 MG capsule Take 600 mg by mouth 3 (three) times daily.       Marland Kitchen ibuprofen (ADVIL,MOTRIN) 200 MG tablet Take 200-400 mg by mouth every 6 (six) hours as needed for fever, headache, mild pain, moderate pain or cramping.      . indomethacin (INDOCIN) 50 MG capsule Take 1 capsule (50 mg total) by mouth 2 (two) times daily with a meal.  20 capsule  0  . Lancets (ONETOUCH ULTRASOFT) lancets TEST ONCE DAILY AS DIRECTED  100 each  11  . metFORMIN (GLUCOPHAGE) 500 MG tablet Take 0.5 tablets (250 mg total) by mouth 2 (two) times daily with a meal.  90 tablet  3  . metoprolol tartrate (LOPRESSOR) 25 MG tablet take 1 tablet by mouth twice a day  60 tablet  11  . ONE TOUCH ULTRA TEST test strip TEST BLOOD GLUCOSE ONCE DAILY  100 each  3  . potassium citrate (UROCIT-K 10) 10 MEQ (1080 MG) SR tablet Take 10 mEq by mouth 2 (two) times daily.       . ranitidine (ZANTAC) 150 MG tablet Take 150 mg  by mouth at bedtime.      Marland Kitchen spironolactone (ALDACTONE) 25 MG tablet Take 25 mg by mouth daily.      Marland Kitchen trolamine salicylate (ASPERCREME) 10 % cream Apply 1 application topically daily. For shoulder and hip pain       No current facility-administered medications on file prior to visit.   Past Medical History  Diagnosis Date  . Hypertension   . Diabetes mellitus   . Depression   . Asthma   . Eczema   . Arthritis   . Cataract   . GERD (gastroesophageal reflux disease)   . History of kidney stones     w/ hx of hydronephrosis - followed by [facility]  . Anemia   . Obesity   . HIV nonspecific serology     2006: indeterminate HIV blood test, seen by ID, felt secondary to cross reacting antibodies with no further workup felt necessary at that time   . Fatty liver     Fatty infiltration of liver noted on 03/2012 CT scan  . CHF, acute 05/03/2012       Review of Systems  Respiratory: Negative.   Cardiovascular: Negative.   Gastrointestinal: Negative.   Musculoskeletal: Positive for arthralgias.  Neurological: Negative.   Psychiatric/Behavioral: Negative.   All other systems reviewed and are negative.  Filed Vitals:   07/12/14 1031  BP: 109/70  Pulse: 71  Temp: 98.4 F (36.9 C)  TempSrc: Oral  Weight: 220 lb 14.4 oz (100.2 kg)       Objective:   Physical Exam  Nursing note and vitals reviewed. Constitutional: She is oriented to person, place, and time. She appears well-developed. No distress.  Cardiovascular: Normal rate, regular rhythm, normal heart sounds and intact distal pulses.   No murmur heard. Pulmonary/Chest: Effort normal and breath sounds normal. No respiratory distress. She has no wheezes.  Abdominal: Soft. Bowel sounds are normal. She exhibits no distension and no mass. There is no tenderness.  Musculoskeletal:       Arms: Plantar surface of  feet tender to touch B/L.  Neurological: She is alert and oriented to person, place, and time.       Assessment:     DM HTN Medication management Right wrist pain B/L Plantar pain     Plan:     Check problem list.

## 2014-07-12 NOTE — Patient Instructions (Signed)
Wrist Exercises RANGE OF MOTION (ROM) AND STRETCHING EXERCISES - Wrist Sprain  These exercises may help you when beginning to rehabilitate your injury. Your symptoms may resolve with or without further involvement from your physician, physical therapist, or athletic trainer. While completing these exercises, remember:   Restoring tissue flexibility helps normal motion to return to the joints. This allows healthier, less painful movement and activity.  An effective stretch should be held for at least 30 seconds.  A stretch should never be painful. You should only feel a gentle lengthening or release in the stretched tissue. RANGE OF MOTION - Wrist Flexion, Active-Assisted  Extend your right / left elbow with your palm pointing down.*  Gently pull the back of your hand toward you until you feel a gentle stretch on the top of your forearm.  Hold this position for __________ seconds. Repeat __________ times. Complete this exercise __________ times per day.  *If directed by your physician, physical therapist, or athletic trainer, complete this stretch with your elbow bent rather than extended. RANGE OF MOTION - Wrist Extension, Active-Assisted   Extend your right / left elbow and turn your palm upward.*  Gently pull your palm/fingertips back so your wrist extends and your fingers point more toward the ground.  You should feel a gentle stretch on the inside of your forearm.  Hold this position for __________ seconds. Repeat __________ times. Complete this exercise __________ times per day. *If directed by your physician, physical therapist, or athletic trainer, complete this stretch with your elbow bent, rather than extended. RANGE OF MOTION - Supination, Active   Stand or sit with your elbows at your side. Bend your right / left elbow to 90 degrees.  Turn your palm upward until you feel a gentle stretch on the inside of your forearm.  Hold this position for __________ seconds. Slowly  release and return to the starting position. Repeat __________ times. Complete this stretch __________ times per day.  RANGE OF MOTION - Pronation, Active   Stand or sit with your elbows at your side. Bend your right / left elbow to 90 degrees.  Turn your palm downward until you feel a gentle stretch on the top of your forearm.  Hold this position for __________ seconds. Slowly release and return to the starting position. Repeat __________ times. Complete this stretch __________ times per day.  STRENGTHENING EXERCISES  These exercises may help you when beginning to rehabilitate your injury. They may resolve your symptoms with or without further involvement from your physician, physical therapist, or athletic trainer. While completing these exercises, remember:   Muscles can gain both the endurance and the strength needed for everyday activities through controlled exercises.  Complete these exercises as instructed by your physician, physical therapist, or athletic trainer. Progress the resistance and repetitions only as guided.  You may experience muscle soreness or fatigue, but the pain or discomfort you are trying to eliminate should never worsen during these exercises. If this pain does worsen, stop and make certain you are following the directions exactly. If the pain is still present after adjustments, discontinue the exercise until you can discuss the trouble with your clinician. STRENGTH - Wrist Flexors  Sit with your right / left forearm palm-up and fully supported. Your elbow should be resting below the height of your shoulder. Allow your wrist to extend over the edge of the surface.  Loosely holding a __________ weight or a piece of rubber exercise band/tubing, slowly curl your hand up toward your forearm.    Hold this position for __________ seconds. Slowly lower the wrist back to the starting position in a controlled manner. Repeat __________ times. Complete this exercise __________  times per day.  STRENGTH - Wrist Extensors  Sit with your right / left forearm palm-down and fully supported. Your elbow should be resting below the height of your shoulder. Allow your wrist to extend over the edge of the surface.  Loosely holding a __________ weight or a piece of rubber exercise band/tubing, slowly curl your hand up toward your forearm.  Hold this position for __________ seconds. Slowly lower the wrist back to the starting position in a controlled manner. Repeat __________ times. Complete this exercise __________ times per day.  STRENGTH - Forearm Supinators  Sit with your right / left forearm supported on a table, keeping your elbow below shoulder height. Rest your hand over the edge, palm down.  Gently grip a hammer or a soup ladle.  Without moving your elbow, slowly turn your palm and hand upward to a "thumbs-up" position.  Hold this position for __________ seconds. Slowly return to the starting position. Repeat __________ times. Complete this exercise __________ times per day.  STRENGTH - Forearm Pronators   Sit with your right / left forearm supported on a table, keeping your elbow below shoulder height. Rest your hand over the edge, palm up.  Gently grip a hammer or a soup ladle.  Without moving your elbow, slowly turn your palm and hand upward to a "thumbs-up" position.  Hold this position for __________ seconds. Slowly return to the starting position. Repeat __________ times. Complete this exercise __________ times per day.  STRENGTH - Grip  Grasp a tennis ball, a dense sponge, or a large, rolled sock in your hand.  Squeeze as hard as you can without increasing any pain.  Hold this position for __________ seconds. Release your grip slowly. Repeat __________ times. Complete this exercise __________ times per day.  Document Released: 09/29/2005 Document Revised: 04/01/2014 Document Reviewed: 02/27/2009 [employer] Patient Information 2015 [employer].  This information is not intended to replace advice given to you by your health care provider. Make sure you discuss any questions you have with your health care provider.  

## 2014-07-12 NOTE — Assessment & Plan Note (Signed)
This could also be related to arthritis. She does have hx of OA of multiple joint. Conservative measure with heel brace recommended.  NB: I called patient to obtain brace at the pharmacy, uncertain if insurance will cover. She verbalized understanding and agreed with pain.

## 2014-07-12 NOTE — Telephone Encounter (Signed)
Pt called and would like the doctor to call in a prescription for a new meter. jw

## 2014-07-15 NOTE — Telephone Encounter (Signed)
Since she is only taking metformin or her diabetes and since it is well controlled  The good news is she does not need to check her blood sugar  We will check an a1c in 3 mo Thanks LC

## 2014-07-15 NOTE — Telephone Encounter (Signed)
Pt is aware and voiced understanding. Karion Cudd,CMA  

## 2014-07-18 ENCOUNTER — Emergency Department (HOSPITAL_COMMUNITY)
Admission: EM | Admit: 2014-07-18 | Discharge: 2014-07-18 | Disposition: A | Discharge status: Home / Self Care | Payer: PRIVATE HEALTH INSURANCE | Source: Home / Self Care | Attending: Emergency Medicine | Admitting: Emergency Medicine

## 2014-07-18 ENCOUNTER — Encounter (HOSPITAL_COMMUNITY): Payer: Self-pay | Admitting: Emergency Medicine

## 2014-07-18 DIAGNOSIS — Z872 Personal history of diseases of the skin and subcutaneous tissue: Secondary | ICD-10-CM | POA: Insufficient documentation

## 2014-07-18 DIAGNOSIS — E119 Type 2 diabetes mellitus without complications: Secondary | ICD-10-CM | POA: Insufficient documentation

## 2014-07-18 DIAGNOSIS — Z7982 Long term (current) use of aspirin: Secondary | ICD-10-CM | POA: Insufficient documentation

## 2014-07-18 DIAGNOSIS — J45909 Unspecified asthma, uncomplicated: Secondary | ICD-10-CM | POA: Insufficient documentation

## 2014-07-18 DIAGNOSIS — F3289 Other specified depressive episodes: Secondary | ICD-10-CM | POA: Insufficient documentation

## 2014-07-18 DIAGNOSIS — Z87442 Personal history of urinary calculi: Secondary | ICD-10-CM | POA: Insufficient documentation

## 2014-07-18 DIAGNOSIS — K219 Gastro-esophageal reflux disease without esophagitis: Secondary | ICD-10-CM

## 2014-07-18 DIAGNOSIS — M79609 Pain in unspecified limb: Secondary | ICD-10-CM | POA: Insufficient documentation

## 2014-07-18 DIAGNOSIS — M129 Arthropathy, unspecified: Secondary | ICD-10-CM | POA: Insufficient documentation

## 2014-07-18 DIAGNOSIS — IMO0002 Reserved for concepts with insufficient information to code with codable children: Secondary | ICD-10-CM

## 2014-07-18 DIAGNOSIS — I509 Heart failure, unspecified: Secondary | ICD-10-CM

## 2014-07-18 DIAGNOSIS — F329 Major depressive disorder, single episode, unspecified: Secondary | ICD-10-CM

## 2014-07-18 DIAGNOSIS — Z79899 Other long term (current) drug therapy: Secondary | ICD-10-CM | POA: Insufficient documentation

## 2014-07-18 DIAGNOSIS — Z21 Asymptomatic human immunodeficiency virus [HIV] infection status: Secondary | ICD-10-CM | POA: Insufficient documentation

## 2014-07-18 DIAGNOSIS — R5381 Other malaise: Secondary | ICD-10-CM | POA: Diagnosis not present

## 2014-07-18 DIAGNOSIS — Z8669 Personal history of other diseases of the nervous system and sense organs: Secondary | ICD-10-CM

## 2014-07-18 DIAGNOSIS — E669 Obesity, unspecified: Secondary | ICD-10-CM | POA: Insufficient documentation

## 2014-07-18 DIAGNOSIS — Z862 Personal history of diseases of the blood and blood-forming organs and certain disorders involving the immune mechanism: Secondary | ICD-10-CM

## 2014-07-18 DIAGNOSIS — I10 Essential (primary) hypertension: Secondary | ICD-10-CM

## 2014-07-18 DIAGNOSIS — M79671 Pain in right foot: Secondary | ICD-10-CM

## 2014-07-18 DIAGNOSIS — N10 Acute tubulo-interstitial nephritis: Secondary | ICD-10-CM | POA: Diagnosis not present

## 2014-07-18 DIAGNOSIS — M722 Plantar fascial fibromatosis: Secondary | ICD-10-CM

## 2014-07-18 DIAGNOSIS — M79672 Pain in left foot: Principal | ICD-10-CM

## 2014-07-18 DIAGNOSIS — R5383 Other fatigue: Secondary | ICD-10-CM | POA: Diagnosis not present

## 2014-07-18 LAB — COMPREHENSIVE METABOLIC PANEL
ALBUMIN: 3.1 g/dL — AB (ref 3.5–5.2)
ALT: 6 U/L (ref 0–35)
ANION GAP: 14 (ref 5–15)
AST: 9 U/L (ref 0–37)
Alkaline Phosphatase: 85 U/L (ref 39–117)
BILIRUBIN TOTAL: 0.9 mg/dL (ref 0.3–1.2)
BUN: 31 mg/dL — AB (ref 6–23)
CHLORIDE: 94 meq/L — AB (ref 96–112)
CO2: 28 mEq/L (ref 19–32)
CREATININE: 1.41 mg/dL — AB (ref 0.50–1.10)
Calcium: 9.8 mg/dL (ref 8.4–10.5)
GFR calc Af Amer: 44 mL/min — ABNORMAL LOW (ref >90)
GFR calc non Af Amer: 38 mL/min — ABNORMAL LOW (ref >90)
GLUCOSE: 228 mg/dL — AB (ref 70–99)
POTASSIUM: 4.5 meq/L (ref 3.7–5.3)
Sodium: 136 mEq/L — ABNORMAL LOW (ref 137–147)
Total Protein: 8.3 g/dL (ref 6.0–8.3)

## 2014-07-18 LAB — URINALYSIS, ROUTINE W REFLEX MICROSCOPIC
BILIRUBIN URINE: NEGATIVE
Glucose, UA: NEGATIVE mg/dL
Hgb urine dipstick: NEGATIVE
KETONES UR: NEGATIVE mg/dL
Nitrite: NEGATIVE
Protein, ur: NEGATIVE mg/dL
SPECIFIC GRAVITY, URINE: 1.01 (ref 1.005–1.030)
UROBILINOGEN UA: 1 mg/dL (ref 0.0–1.0)
pH: 6 (ref 5.0–8.0)

## 2014-07-18 LAB — URINE MICROSCOPIC-ADD ON

## 2014-07-18 LAB — PRO B NATRIURETIC PEPTIDE: Pro B Natriuretic peptide (BNP): 98.1 pg/mL (ref 0–125)

## 2014-07-18 LAB — CBC WITH DIFFERENTIAL/PLATELET
BASOS PCT: 0 % (ref 0–1)
Basophils Absolute: 0 10*3/uL (ref 0.0–0.1)
Eosinophils Absolute: 0 10*3/uL (ref 0.0–0.7)
Eosinophils Relative: 0 % (ref 0–5)
HEMATOCRIT: 24.7 % — AB (ref 36.0–46.0)
HEMOGLOBIN: 7.9 g/dL — AB (ref 12.0–15.0)
Lymphocytes Relative: 11 % — ABNORMAL LOW (ref 12–46)
Lymphs Abs: 2.4 10*3/uL (ref 0.7–4.0)
MCH: 27.9 pg (ref 26.0–34.0)
MCHC: 32 g/dL (ref 30.0–36.0)
MCV: 87.3 fL (ref 78.0–100.0)
MONO ABS: 2.4 10*3/uL — AB (ref 0.1–1.0)
MONOS PCT: 11 % (ref 3–12)
NEUTROS PCT: 78 % — AB (ref 43–77)
Neutro Abs: 16.7 10*3/uL — ABNORMAL HIGH (ref 1.7–7.7)
Platelets: 338 10*3/uL (ref 150–400)
RBC: 2.83 MIL/uL — AB (ref 3.87–5.11)
RDW: 16.3 % — ABNORMAL HIGH (ref 11.5–15.5)
WBC: 21.5 10*3/uL — ABNORMAL HIGH (ref 4.0–10.5)

## 2014-07-18 MED ORDER — ONDANSETRON 4 MG PO TBDP
8.0000 mg | ORAL_TABLET | Freq: Once | ORAL | Status: AC
  Administered 2014-07-18: 8 mg via ORAL
  Filled 2014-07-18: qty 2

## 2014-07-18 MED ORDER — OXYCODONE-ACETAMINOPHEN 5-325 MG PO TABS
2.0000 | ORAL_TABLET | Freq: Once | ORAL | Status: AC
  Administered 2014-07-18: 2 via ORAL
  Filled 2014-07-18: qty 2

## 2014-07-18 NOTE — ED Notes (Signed)
Per EMS- Pt comes from home c/o BLE pain x 3 weeks. The pain is severe today, was discharged from rehab 3 weeks ago after accidental overdose. Reports more swelling in BLE. Also pain to right forearm, with splint in place for 1 year. BP 124/82, HR 90, RR 16, CBG 299. Has neuropathy.

## 2014-07-18 NOTE — Discharge Instructions (Signed)
Please follow up with your podiatrist and wear the shoe inserts that your PCP has recommended for you.   Plantar Fasciitis (Heel Spur Syndrome) with Rehab The plantar fascia is a fibrous, ligament-like, soft-tissue structure that spans the bottom of the foot. Plantar fasciitis is a condition that causes pain in the foot due to inflammation of the tissue. SYMPTOMS   Pain and tenderness on the underneath side of the foot.  Pain that worsens with standing or walking. CAUSES  Plantar fasciitis is caused by irritation and injury to the plantar fascia on the underneath side of the foot. Common mechanisms of injury include:  Direct trauma to bottom of the foot.  Damage to a small nerve that runs under the foot where the main fascia attaches to the heel bone.  Stress placed on the plantar fascia due to bone spurs. RISK INCREASES WITH:   Activities that place stress on the plantar fascia (running, jumping, pivoting, or cutting).  Poor strength and flexibility.  Improperly fitted shoes.  Tight calf muscles.  Flat feet.  Failure to warm-up properly before activity.  Obesity. PREVENTION  Warm up and stretch properly before activity.  Allow for adequate recovery between workouts.  Maintain physical fitness:  Strength, flexibility, and endurance.  Cardiovascular fitness.  Maintain a health body weight.  Avoid stress on the plantar fascia.  Wear properly fitted shoes, including arch supports for individuals who have flat feet. PROGNOSIS  If treated properly, then the symptoms of plantar fasciitis usually resolve without surgery. However, occasionally surgery is necessary. RELATED COMPLICATIONS   Recurrent symptoms that may result in a chronic condition.  Problems of the lower back that are caused by compensating for the injury, such as limping.  Pain or weakness of the foot during push-off following surgery.  Chronic inflammation, scarring, and partial or complete fascia  tear, occurring more often from repeated injections. TREATMENT  Treatment initially involves the use of ice and medication to help reduce pain and inflammation. The use of strengthening and stretching exercises may help reduce pain with activity, especially stretches of the Achilles tendon. These exercises may be performed at home or with a therapist. Your caregiver may recommend that you use heel cups of arch supports to help reduce stress on the plantar fascia. Occasionally, corticosteroid injections are given to reduce inflammation. If symptoms persist for greater than 6 months despite non-surgical (conservative), then surgery may be recommended.  MEDICATION   If pain medication is necessary, then nonsteroidal anti-inflammatory medications, such as aspirin and ibuprofen, or other minor pain relievers, such as acetaminophen, are often recommended.  Do not take pain medication within 7 days before surgery.  Prescription pain relievers may be given if deemed necessary by your caregiver. Use only as directed and only as much as you need.  Corticosteroid injections may be given by your caregiver. These injections should be reserved for the most serious cases, because they may only be given a certain number of times. HEAT AND COLD  Cold treatment (icing) relieves pain and reduces inflammation. Cold treatment should be applied for 10 to 15 minutes every 2 to 3 hours for inflammation and pain and immediately after any activity that aggravates your symptoms. Use ice packs or massage the area with a piece of ice (ice massage).  Heat treatment may be used prior to performing the stretching and strengthening activities prescribed by your caregiver, physical therapist, or athletic trainer. Use a heat pack or soak the injury in warm water. SEEK IMMEDIATE MEDICAL CARE IF:  Treatment seems to offer no benefit, or the condition worsens.  Any medications produce adverse side effects. EXERCISES RANGE OF  MOTION (ROM) AND STRETCHING EXERCISES - Plantar Fasciitis (Heel Spur Syndrome) These exercises may help you when beginning to rehabilitate your injury. Your symptoms may resolve with or without further involvement from your physician, physical therapist or athletic trainer. While completing these exercises, remember:   Restoring tissue flexibility helps normal motion to return to the joints. This allows healthier, less painful movement and activity.  An effective stretch should be held for at least 30 seconds.  A stretch should never be painful. You should only feel a gentle lengthening or release in the stretched tissue. RANGE OF MOTION - Toe Extension, Flexion  Sit with your right / left leg crossed over your opposite knee.  Grasp your toes and gently pull them back toward the top of your foot. You should feel a stretch on the bottom of your toes and/or foot.  Hold this stretch for __________ seconds.  Now, gently pull your toes toward the bottom of your foot. You should feel a stretch on the top of your toes and or foot.  Hold this stretch for __________ seconds. Repeat __________ times. Complete this stretch __________ times per day.  RANGE OF MOTION - Ankle Dorsiflexion, Active Assisted  Remove shoes and sit on a chair that is preferably not on a carpeted surface.  Place right / left foot under knee. Extend your opposite leg for support.  Keeping your heel down, slide your right / left foot back toward the chair until you feel a stretch at your ankle or calf. If you do not feel a stretch, slide your bottom forward to the edge of the chair, while still keeping your heel down.  Hold this stretch for __________ seconds. Repeat __________ times. Complete this stretch __________ times per day.  STRETCH - Gastroc, Standing  Place hands on wall.  Extend right / left leg, keeping the front knee somewhat bent.  Slightly point your toes inward on your back foot.  Keeping your right /  left heel on the floor and your knee straight, shift your weight toward the wall, not allowing your back to arch.  You should feel a gentle stretch in the right / left calf. Hold this position for __________ seconds. Repeat __________ times. Complete this stretch __________ times per day. STRETCH - Soleus, Standing  Place hands on wall.  Extend right / left leg, keeping the other knee somewhat bent.  Slightly point your toes inward on your back foot.  Keep your right / left heel on the floor, bend your back knee, and slightly shift your weight over the back leg so that you feel a gentle stretch deep in your back calf.  Hold this position for __________ seconds. Repeat __________ times. Complete this stretch __________ times per day. STRETCH - Gastrocsoleus, Standing  Note: This exercise can place a lot of stress on your foot and ankle. Please complete this exercise only if specifically instructed by your caregiver.   Place the ball of your right / left foot on a step, keeping your other foot firmly on the same step.  Hold on to the wall or a rail for balance.  Slowly lift your other foot, allowing your body weight to press your heel down over the edge of the step.  You should feel a stretch in your right / left calf.  Hold this position for __________ seconds.  Repeat this exercise with  a slight bend in your right / left knee. Repeat __________ times. Complete this stretch __________ times per day.  STRENGTHENING EXERCISES - Plantar Fasciitis (Heel Spur Syndrome)  These exercises may help you when beginning to rehabilitate your injury. They may resolve your symptoms with or without further involvement from your physician, physical therapist or athletic trainer. While completing these exercises, remember:   Muscles can gain both the endurance and the strength needed for everyday activities through controlled exercises.  Complete these exercises as instructed by your physician,  physical therapist or athletic trainer. Progress the resistance and repetitions only as guided. STRENGTH - Towel Curls  Sit in a chair positioned on a non-carpeted surface.  Place your foot on a towel, keeping your heel on the floor.  Pull the towel toward your heel by only curling your toes. Keep your heel on the floor.  If instructed by your physician, physical therapist or athletic trainer, add ____________________ at the end of the towel. Repeat __________ times. Complete this exercise __________ times per day. STRENGTH - Ankle Inversion  Secure one end of a rubber exercise band/tubing to a fixed object (table, pole). Loop the other end around your foot just before your toes.  Place your fists between your knees. This will focus your strengthening at your ankle.  Slowly, pull your big toe up and in, making sure the band/tubing is positioned to resist the entire motion.  Hold this position for __________ seconds.  Have your muscles resist the band/tubing as it slowly pulls your foot back to the starting position. Repeat __________ times. Complete this exercises __________ times per day.  Document Released: 11/15/2005 Document Revised: 02/07/2012 Document Reviewed: 02/27/2009 [facility] Patient Information 2015 [place], [place]. This information is not intended to replace advice given to you by your health care provider. Make sure you discuss any questions you have with your health care provider.

## 2014-07-18 NOTE — ED Provider Notes (Signed)
CSN: [id number]     Arrival date & time 07/18/14  9030 History   First MD Initiated Contact with Patient 07/18/14 [phone]     Chief Complaint  Patient presents with  . Extremity Pain     (Consider location/radiation/quality/duration/timing/severity/associated sxs/prior Treatment) HPI Comments: Patient is a 63 year old female with history of hypertension, diabetes, depression, asthma, eczema, fatty liver, CHF who presents to the emergency department today for evaluation of bilateral lower extremity pain and swelling. She states that she was discharged from rehabilitation for an accidental drug overdose earlier this week. She was doing well and walking. She was diagnosed with plantar fascitis by her PCP and inserts for her shoes were called into the pharmacy. She did not pick these inserts up. 3 days ago she developed worsening pain in both of her feet while ambulating. Her feet have been swelling. She takes hydrocodone for pain which has not improved her symptoms. She denies any fevers or chills. No chest pain or shortness of breath. She reports she is here mostly for pain control.   Patient is a 63 y.o. female presenting with extremity pain. The history is provided by the patient. No language interpreter was used.  Extremity Pain Associated symptoms include arthralgias and myalgias. Pertinent negatives include no abdominal pain, chest pain, chills, fever, nausea or vomiting.    Past Medical History  Diagnosis Date  . Hypertension   . Diabetes mellitus   . Depression   . Asthma   . Eczema   . Arthritis   . Cataract   . GERD (gastroesophageal reflux disease)   . History of kidney stones     w/ hx of hydronephrosis - followed by [facility]  . Anemia   . Obesity   . HIV nonspecific serology     2006: indeterminate HIV blood test, seen by ID, felt secondary to cross reacting antibodies with no further workup felt necessary at that time   . Fatty liver     Fatty infiltration of liver  noted on 03/2012 CT scan  . CHF, acute 05/03/2012   Past Surgical History  Procedure Laterality Date  . Cystoscopy w/ litholapaxy / ehl    . Cholecystectomy  2003  . Replacement total knee bilateral  2005 &2006  . Joint replacement      bilateral knee replacement  . Vascular surgery Right 03/15/2013    Ultrasound guided sclerotherapy   Family History  Problem Relation Age of Onset  . Diabetes Mother   . Stroke Mother   . Heart disease Father   . Anemia Father    History  Substance Use Topics  . Smoking status: Never Smoker   . Smokeless tobacco: Never Used  . Alcohol Use: No   OB History   Grav Para Term Preterm Abortions TAB SAB Ect Mult Living                 Review of Systems  Constitutional: Negative for fever and chills.  Respiratory: Negative for shortness of breath.   Cardiovascular: Negative for chest pain.  Gastrointestinal: Negative for nausea, vomiting and abdominal pain.  Musculoskeletal: Positive for arthralgias, gait problem and myalgias.  All other systems reviewed and are negative.     Allergies  Review of patient's allergies indicates no known allergies.  Home Medications   Prior to Admission medications   Medication Sig Start Date End Date Taking? Authorizing Provider  albuterol (PROVENTIL HFA;VENTOLIN HFA) 108 (90 BASE) MCG/ACT inhaler Inhale 2 puffs into the lungs every 6 (  six) hours as needed for wheezing or shortness of breath.   Yes Historical Provider, MD  aspirin 81 MG tablet Take 81 mg by mouth daily.   Yes Historical Provider, MD  benazepril (LOTENSIN) 20 MG tablet Take 1 tablet (20 mg total) by mouth daily. 08/27/13  Yes Lind Covert, MD  fish oil-omega-3 fatty acids 1000 MG capsule Take 1 g by mouth 2 (two) times daily.   Yes Historical Provider, MD  fluticasone (FLOVENT HFA) 110 MCG/ACT inhaler Inhale 2 puffs into the lungs 2 (two) times daily. 12/03/13  Yes Blane Ohara McDiarmid, MD  furosemide (LASIX) 40 MG tablet Take 80 mg by mouth  daily.   Yes Historical Provider, MD  gabapentin (NEURONTIN) 300 MG capsule Take 600 mg by mouth 3 (three) times daily.  02/06/14  Yes Harriet Masson, DPM  HYDROcodone-acetaminophen (NORCO/VICODIN) 5-325 MG per tablet Take 1 tablet by mouth every 12 (twelve) hours as needed for moderate pain.   Yes Historical Provider, MD  metFORMIN (GLUCOPHAGE) 500 MG tablet Take 0.5 tablets (250 mg total) by mouth 2 (two) times daily with a meal. 08/27/13  Yes Lind Covert, MD  metoprolol tartrate (LOPRESSOR) 25 MG tablet take 1 tablet by mouth twice a day 06/25/14  Yes Lind Covert, MD  potassium citrate (UROCIT-K 10) 10 MEQ (1080 MG) SR tablet Take 10 mEq by mouth 2 (two) times daily.    Yes Historical Provider, MD  ranitidine (ZANTAC) 150 MG tablet Take 150 mg by mouth at bedtime.   Yes Historical Provider, MD  spironolactone (ALDACTONE) 25 MG tablet Take 25 mg by mouth daily.   Yes Historical Provider, MD  trolamine salicylate (ASPERCREME) 10 % cream Apply 1 application topically daily. For shoulder and hip pain   Yes Historical Provider, MD   BP 123/72  Pulse 95  Temp(Src) 98.3 F (36.8 C) (Oral)  Resp 11  Ht 5\' 2"  (1.575 m)  Wt 219 lb (99.338 kg)  BMI 40.05 kg/m2  SpO2 99% Physical Exam  Nursing note and vitals reviewed. Constitutional: She is oriented to person, place, and time. She appears well-developed and well-nourished. No distress.  obese  HENT:  Head: Normocephalic and atraumatic.  Right Ear: External ear normal.  Left Ear: External ear normal.  Nose: Nose normal.  Mouth/Throat: Oropharynx is clear and moist.  Eyes: Conjunctivae and EOM are normal. Pupils are equal, round, and reactive to light.  Neck: Normal range of motion.  Cardiovascular: Normal rate, regular rhythm, normal heart sounds, intact distal pulses and normal pulses.   Pulses:      Posterior tibial pulses are 2+ on the right side, and 2+ on the left side.  Bilateral non pitting edema to lower extremities.  Capillary refill 3 seconds in all toes No calf tenderness  Pulmonary/Chest: Effort normal and breath sounds normal. No stridor. No respiratory distress. She has no wheezes. She has no rales.  Abdominal: Soft. She exhibits no distension.  Musculoskeletal: Normal range of motion.  Right wrist in velcro splint  Neurological: She is alert and oriented to person, place, and time. She has normal strength.  Sensation intact to bilateral lower extremities  Skin: Skin is warm and dry. She is not diaphoretic. No erythema.  No erythema, streaking, bruising to lower extremities bilaterally  Psychiatric: She has a normal mood and affect. Her behavior is normal.    ED Course  Procedures (including critical care time) Labs Review Labs Reviewed  CBC WITH DIFFERENTIAL - Abnormal; Notable for the following:  WBC 21.5 (*)    RBC 2.83 (*)    Hemoglobin 7.9 (*)    HCT 24.7 (*)    RDW 16.3 (*)    Neutrophils Relative % 78 (*)    Neutro Abs 16.7 (*)    Lymphocytes Relative 11 (*)    Monocytes Absolute 2.4 (*)    All other components within normal limits  COMPREHENSIVE METABOLIC PANEL - Abnormal; Notable for the following:    Sodium 136 (*)    Chloride 94 (*)    Glucose, Bld 228 (*)    BUN 31 (*)    Creatinine, Ser 1.41 (*)    Albumin 3.1 (*)    GFR calc non Af Amer 38 (*)    GFR calc Af Amer 44 (*)    All other components within normal limits  URINALYSIS, ROUTINE W REFLEX MICROSCOPIC - Abnormal; Notable for the following:    Leukocytes, UA TRACE (*)    All other components within normal limits  URINE MICROSCOPIC-ADD ON - Abnormal; Notable for the following:    Bacteria, UA FEW (*)    All other components within normal limits  PRO B NATRIURETIC PEPTIDE    Imaging Review No results found.   EKG Interpretation None      MDM   Final diagnoses:  Foot pain, bilateral  Plantar fasciitis    Patient presents emergency department for evaluation of bilateral foot pain. She's been seen  by her primary care physician for this complaint. She has not gotten the inserts prescribed to her. She will not be sent home with any narcotic pain medicine given recent admission for accidental overdose. Patient encouraged to wear supportive shoes and see her podiatrist at scheduled appointment next week. Dr. Thurnell Garbe evaluated patient and agrees with plan. Patient / Family / Caregiver informed of clinical course, understand medical decision-making process, and agree with plan.     Elwyn Lade, PA-C 07/19/14 [phone]

## 2014-07-19 ENCOUNTER — Telehealth: Payer: Self-pay | Admitting: Family Medicine

## 2014-07-19 NOTE — Telephone Encounter (Signed)
[name] from [facility] calls to informed of a missed OT visit. Pt also declined OT today due to increased pain in legs. PT went to ED for pain yesterday. FYI

## 2014-07-20 ENCOUNTER — Inpatient Hospital Stay (HOSPITAL_COMMUNITY)
Admission: EM | Admit: 2014-07-20 | Discharge: 2014-07-25 | DRG: 690 | Disposition: A | Discharge status: Skilled Nursing Facility | Payer: PRIVATE HEALTH INSURANCE | Attending: Family Medicine | Admitting: Family Medicine

## 2014-07-20 ENCOUNTER — Encounter (HOSPITAL_COMMUNITY): Payer: Self-pay | Admitting: Emergency Medicine

## 2014-07-20 DIAGNOSIS — N179 Acute kidney failure, unspecified: Secondary | ICD-10-CM | POA: Diagnosis present

## 2014-07-20 DIAGNOSIS — Z823 Family history of stroke: Secondary | ICD-10-CM | POA: Diagnosis not present

## 2014-07-20 DIAGNOSIS — F3289 Other specified depressive episodes: Secondary | ICD-10-CM

## 2014-07-20 DIAGNOSIS — Z833 Family history of diabetes mellitus: Secondary | ICD-10-CM

## 2014-07-20 DIAGNOSIS — M545 Low back pain, unspecified: Secondary | ICD-10-CM

## 2014-07-20 DIAGNOSIS — N039 Chronic nephritic syndrome with unspecified morphologic changes: Secondary | ICD-10-CM | POA: Diagnosis present

## 2014-07-20 DIAGNOSIS — E119 Type 2 diabetes mellitus without complications: Secondary | ICD-10-CM | POA: Diagnosis present

## 2014-07-20 DIAGNOSIS — G5790 Unspecified mononeuropathy of unspecified lower limb: Secondary | ICD-10-CM | POA: Diagnosis not present

## 2014-07-20 DIAGNOSIS — E871 Hypo-osmolality and hyponatremia: Secondary | ICD-10-CM | POA: Diagnosis present

## 2014-07-20 DIAGNOSIS — F32A Depression, unspecified: Secondary | ICD-10-CM | POA: Diagnosis present

## 2014-07-20 DIAGNOSIS — Z7982 Long term (current) use of aspirin: Secondary | ICD-10-CM | POA: Diagnosis not present

## 2014-07-20 DIAGNOSIS — R5381 Other malaise: Secondary | ICD-10-CM | POA: Diagnosis present

## 2014-07-20 DIAGNOSIS — N10 Acute tubulo-interstitial nephritis: Secondary | ICD-10-CM | POA: Diagnosis present

## 2014-07-20 DIAGNOSIS — Z6841 Body Mass Index (BMI) 40.0 and over, adult: Secondary | ICD-10-CM | POA: Diagnosis not present

## 2014-07-20 DIAGNOSIS — E1169 Type 2 diabetes mellitus with other specified complication: Secondary | ICD-10-CM

## 2014-07-20 DIAGNOSIS — I5022 Chronic systolic (congestive) heart failure: Secondary | ICD-10-CM

## 2014-07-20 DIAGNOSIS — D631 Anemia in chronic kidney disease: Secondary | ICD-10-CM | POA: Diagnosis present

## 2014-07-20 DIAGNOSIS — D72829 Elevated white blood cell count, unspecified: Secondary | ICD-10-CM

## 2014-07-20 DIAGNOSIS — M1990 Unspecified osteoarthritis, unspecified site: Secondary | ICD-10-CM

## 2014-07-20 DIAGNOSIS — N183 Chronic kidney disease, stage 3 unspecified: Secondary | ICD-10-CM | POA: Diagnosis present

## 2014-07-20 DIAGNOSIS — N390 Urinary tract infection, site not specified: Secondary | ICD-10-CM

## 2014-07-20 DIAGNOSIS — J45909 Unspecified asthma, uncomplicated: Secondary | ICD-10-CM | POA: Diagnosis present

## 2014-07-20 DIAGNOSIS — M130 Polyarthritis, unspecified: Secondary | ICD-10-CM | POA: Diagnosis present

## 2014-07-20 DIAGNOSIS — K219 Gastro-esophageal reflux disease without esophagitis: Secondary | ICD-10-CM | POA: Diagnosis present

## 2014-07-20 DIAGNOSIS — I5042 Chronic combined systolic (congestive) and diastolic (congestive) heart failure: Secondary | ICD-10-CM | POA: Diagnosis present

## 2014-07-20 DIAGNOSIS — L259 Unspecified contact dermatitis, unspecified cause: Secondary | ICD-10-CM | POA: Diagnosis present

## 2014-07-20 DIAGNOSIS — E878 Other disorders of electrolyte and fluid balance, not elsewhere classified: Secondary | ICD-10-CM | POA: Diagnosis present

## 2014-07-20 DIAGNOSIS — G8929 Other chronic pain: Secondary | ICD-10-CM | POA: Diagnosis present

## 2014-07-20 DIAGNOSIS — Z791 Long term (current) use of non-steroidal anti-inflammatories (NSAID): Secondary | ICD-10-CM | POA: Diagnosis not present

## 2014-07-20 DIAGNOSIS — E1142 Type 2 diabetes mellitus with diabetic polyneuropathy: Secondary | ICD-10-CM

## 2014-07-20 DIAGNOSIS — E669 Obesity, unspecified: Secondary | ICD-10-CM | POA: Diagnosis present

## 2014-07-20 DIAGNOSIS — M109 Gout, unspecified: Secondary | ICD-10-CM | POA: Diagnosis present

## 2014-07-20 DIAGNOSIS — M722 Plantar fascial fibromatosis: Secondary | ICD-10-CM | POA: Diagnosis present

## 2014-07-20 DIAGNOSIS — I509 Heart failure, unspecified: Secondary | ICD-10-CM | POA: Diagnosis present

## 2014-07-20 DIAGNOSIS — M21969 Unspecified acquired deformity of unspecified lower leg: Secondary | ICD-10-CM

## 2014-07-20 DIAGNOSIS — I10 Essential (primary) hypertension: Secondary | ICD-10-CM

## 2014-07-20 DIAGNOSIS — J4520 Mild intermittent asthma, uncomplicated: Secondary | ICD-10-CM | POA: Diagnosis present

## 2014-07-20 DIAGNOSIS — Z96659 Presence of unspecified artificial knee joint: Secondary | ICD-10-CM

## 2014-07-20 DIAGNOSIS — F329 Major depressive disorder, single episode, unspecified: Secondary | ICD-10-CM | POA: Diagnosis present

## 2014-07-20 DIAGNOSIS — M549 Dorsalgia, unspecified: Secondary | ICD-10-CM | POA: Diagnosis present

## 2014-07-20 DIAGNOSIS — Z8249 Family history of ischemic heart disease and other diseases of the circulatory system: Secondary | ICD-10-CM | POA: Diagnosis not present

## 2014-07-20 DIAGNOSIS — G9340 Encephalopathy, unspecified: Secondary | ICD-10-CM

## 2014-07-20 DIAGNOSIS — R5383 Other fatigue: Secondary | ICD-10-CM | POA: Diagnosis present

## 2014-07-20 DIAGNOSIS — I129 Hypertensive chronic kidney disease with stage 1 through stage 4 chronic kidney disease, or unspecified chronic kidney disease: Secondary | ICD-10-CM | POA: Diagnosis present

## 2014-07-20 DIAGNOSIS — M25531 Pain in right wrist: Secondary | ICD-10-CM

## 2014-07-20 LAB — COMPREHENSIVE METABOLIC PANEL
ALBUMIN: 2.8 g/dL — AB (ref 3.5–5.2)
ALK PHOS: 120 U/L — AB (ref 39–117)
ALT: 8 U/L (ref 0–35)
ANION GAP: 19 — AB (ref 5–15)
AST: 16 U/L (ref 0–37)
BILIRUBIN TOTAL: 1 mg/dL (ref 0.3–1.2)
BUN: 35 mg/dL — AB (ref 6–23)
CO2: 24 meq/L (ref 19–32)
CREATININE: 1.53 mg/dL — AB (ref 0.50–1.10)
Calcium: 10 mg/dL (ref 8.4–10.5)
Chloride: 88 mEq/L — ABNORMAL LOW (ref 96–112)
GFR calc Af Amer: 40 mL/min — ABNORMAL LOW (ref >90)
GFR, EST NON AFRICAN AMERICAN: 35 mL/min — AB (ref >90)
Glucose, Bld: 247 mg/dL — ABNORMAL HIGH (ref 70–99)
POTASSIUM: 4.9 meq/L (ref 3.7–5.3)
Sodium: 131 mEq/L — ABNORMAL LOW (ref 137–147)
Total Protein: 9.1 g/dL — ABNORMAL HIGH (ref 6.0–8.3)

## 2014-07-20 LAB — URINALYSIS, ROUTINE W REFLEX MICROSCOPIC
BILIRUBIN URINE: NEGATIVE
Glucose, UA: NEGATIVE mg/dL
Hgb urine dipstick: NEGATIVE
KETONES UR: NEGATIVE mg/dL
NITRITE: NEGATIVE
PH: 5.5 (ref 5.0–8.0)
PROTEIN: NEGATIVE mg/dL
Specific Gravity, Urine: 1.013 (ref 1.005–1.030)
Urobilinogen, UA: 2 mg/dL — ABNORMAL HIGH (ref 0.0–1.0)

## 2014-07-20 LAB — CBC WITH DIFFERENTIAL/PLATELET
Basophils Absolute: 0.1 10*3/uL (ref 0.0–0.1)
Basophils Relative: 0 % (ref 0–1)
EOS ABS: 0 10*3/uL (ref 0.0–0.7)
EOS PCT: 0 % (ref 0–5)
HCT: 23.5 % — ABNORMAL LOW (ref 36.0–46.0)
HEMOGLOBIN: 7.4 g/dL — AB (ref 12.0–15.0)
Lymphocytes Relative: 10 % — ABNORMAL LOW (ref 12–46)
Lymphs Abs: 2.3 10*3/uL (ref 0.7–4.0)
MCH: 27.1 pg (ref 26.0–34.0)
MCHC: 31.5 g/dL (ref 30.0–36.0)
MCV: 86.1 fL (ref 78.0–100.0)
MONOS PCT: 13 % — AB (ref 3–12)
Monocytes Absolute: 3.2 10*3/uL — ABNORMAL HIGH (ref 0.1–1.0)
Neutro Abs: 18.7 10*3/uL — ABNORMAL HIGH (ref 1.7–7.7)
Neutrophils Relative %: 77 % (ref 43–77)
Platelets: 413 10*3/uL — ABNORMAL HIGH (ref 150–400)
RBC: 2.73 MIL/uL — ABNORMAL LOW (ref 3.87–5.11)
RDW: 17 % — ABNORMAL HIGH (ref 11.5–15.5)
WBC: 24.3 10*3/uL — ABNORMAL HIGH (ref 4.0–10.5)

## 2014-07-20 LAB — URINE MICROSCOPIC-ADD ON

## 2014-07-20 LAB — GLUCOSE, CAPILLARY: Glucose-Capillary: 338 mg/dL — ABNORMAL HIGH (ref 70–99)

## 2014-07-20 MED ORDER — INSULIN ASPART 100 UNIT/ML ~~LOC~~ SOLN
0.0000 [IU] | Freq: Every day | SUBCUTANEOUS | Status: DC
  Administered 2014-07-20: 4 [IU] via SUBCUTANEOUS

## 2014-07-20 MED ORDER — FLUTICASONE PROPIONATE HFA 110 MCG/ACT IN AERO
2.0000 | INHALATION_SPRAY | Freq: Two times a day (BID) | RESPIRATORY_TRACT | Status: DC
  Administered 2014-07-20 – 2014-07-25 (×9): 2 via RESPIRATORY_TRACT
  Filled 2014-07-20: qty 12

## 2014-07-20 MED ORDER — ONDANSETRON HCL 4 MG PO TABS: 4.0000 mg | ORAL_TABLET | Freq: Four times a day (QID) | ORAL | Status: DC | PRN

## 2014-07-20 MED ORDER — HYDRALAZINE HCL 20 MG/ML IJ SOLN: 5.0000 mg | INTRAMUSCULAR | Status: DC | PRN

## 2014-07-20 MED ORDER — ACETAMINOPHEN 650 MG RE SUPP: 650.0000 mg | Freq: Four times a day (QID) | RECTAL | Status: DC | PRN

## 2014-07-20 MED ORDER — SODIUM CHLORIDE 0.9 % IJ SOLN
3.0000 mL | Freq: Two times a day (BID) | INTRAMUSCULAR | Status: DC
  Administered 2014-07-21 – 2014-07-25 (×4): 3 mL via INTRAVENOUS

## 2014-07-20 MED ORDER — ASPIRIN 81 MG PO CHEW
81.0000 mg | CHEWABLE_TABLET | Freq: Every day | ORAL | Status: DC
  Administered 2014-07-20 – 2014-07-25 (×6): 81 mg via ORAL
  Filled 2014-07-20 (×6): qty 1

## 2014-07-20 MED ORDER — SODIUM CHLORIDE 0.9 % IV SOLN
INTRAVENOUS | Status: DC
  Administered 2014-07-20: 19:00:00 via INTRAVENOUS

## 2014-07-20 MED ORDER — SODIUM CHLORIDE 0.9 % IV BOLUS (SEPSIS)
1000.0000 mL | Freq: Once | INTRAVENOUS | Status: AC
  Administered 2014-07-20: 1000 mL via INTRAVENOUS

## 2014-07-20 MED ORDER — METOPROLOL TARTRATE 25 MG PO TABS
25.0000 mg | ORAL_TABLET | Freq: Two times a day (BID) | ORAL | Status: DC
  Administered 2014-07-20 – 2014-07-25 (×9): 25 mg via ORAL
  Filled 2014-07-20 (×11): qty 1

## 2014-07-20 MED ORDER — POTASSIUM CITRATE ER 10 MEQ (1080 MG) PO TBCR
10.0000 meq | EXTENDED_RELEASE_TABLET | Freq: Two times a day (BID) | ORAL | Status: DC
  Administered 2014-07-20 – 2014-07-25 (×10): 10 meq via ORAL
  Filled 2014-07-20 (×12): qty 1

## 2014-07-20 MED ORDER — BACLOFEN 10 MG PO TABS
10.0000 mg | ORAL_TABLET | Freq: Three times a day (TID) | ORAL | Status: DC | PRN
  Filled 2014-07-20: qty 1

## 2014-07-20 MED ORDER — ALBUTEROL SULFATE (2.5 MG/3ML) 0.083% IN NEBU: 2.5000 mg | INHALATION_SOLUTION | Freq: Four times a day (QID) | RESPIRATORY_TRACT | Status: DC | PRN

## 2014-07-20 MED ORDER — HYDROMORPHONE HCL PF 1 MG/ML IJ SOLN: 0.5000 mg | INTRAMUSCULAR | Status: DC | PRN

## 2014-07-20 MED ORDER — ONDANSETRON HCL 4 MG/2ML IJ SOLN: 4.0000 mg | Freq: Three times a day (TID) | INTRAMUSCULAR | Status: DC | PRN

## 2014-07-20 MED ORDER — MORPHINE SULFATE 2 MG/ML IJ SOLN
2.0000 mg | INTRAMUSCULAR | Status: DC | PRN
  Administered 2014-07-20 – 2014-07-25 (×8): 2 mg via INTRAVENOUS
  Filled 2014-07-20 (×8): qty 1

## 2014-07-20 MED ORDER — DEXTROSE 5 % IV SOLN
1.0000 g | Freq: Once | INTRAVENOUS | Status: AC
  Administered 2014-07-20: 1 g via INTRAVENOUS
  Filled 2014-07-20: qty 10

## 2014-07-20 MED ORDER — FUROSEMIDE 80 MG PO TABS: 80.0000 mg | ORAL_TABLET | Freq: Every morning | ORAL | Status: DC

## 2014-07-20 MED ORDER — ACETAMINOPHEN 325 MG PO TABS
650.0000 mg | ORAL_TABLET | Freq: Once | ORAL | Status: AC
  Administered 2014-07-20: 650 mg via ORAL
  Filled 2014-07-20: qty 2

## 2014-07-20 MED ORDER — GABAPENTIN 300 MG PO CAPS
600.0000 mg | ORAL_CAPSULE | Freq: Three times a day (TID) | ORAL | Status: DC
  Administered 2014-07-20 – 2014-07-23 (×8): 600 mg via ORAL
  Filled 2014-07-20 (×10): qty 2

## 2014-07-20 MED ORDER — ASPIRIN 81 MG PO TABS
81.0000 mg | ORAL_TABLET | Freq: Every day | ORAL | Status: DC
  Filled 2014-07-20: qty 1

## 2014-07-20 MED ORDER — SENNA 8.6 MG PO TABS
1.0000 | ORAL_TABLET | Freq: Every day | ORAL | Status: DC | PRN
  Filled 2014-07-20: qty 1

## 2014-07-20 MED ORDER — BENAZEPRIL HCL 20 MG PO TABS
20.0000 mg | ORAL_TABLET | Freq: Every day | ORAL | Status: DC
  Filled 2014-07-20: qty 1

## 2014-07-20 MED ORDER — HEPARIN SODIUM (PORCINE) 5000 UNIT/ML IJ SOLN
5000.0000 [IU] | Freq: Three times a day (TID) | INTRAMUSCULAR | Status: DC
Start: 2014-07-20 — End: 2014-07-22
  Administered 2014-07-20 – 2014-07-22 (×6): 5000 [IU] via SUBCUTANEOUS
  Filled 2014-07-20 (×8): qty 1

## 2014-07-20 MED ORDER — DEXTROSE 5 % IV SOLN: 1.0000 g | INTRAVENOUS | Status: DC

## 2014-07-20 MED ORDER — INSULIN ASPART 100 UNIT/ML ~~LOC~~ SOLN
0.0000 [IU] | Freq: Three times a day (TID) | SUBCUTANEOUS | Status: DC
  Administered 2014-07-21: 5 [IU] via SUBCUTANEOUS
  Administered 2014-07-21: 8 [IU] via SUBCUTANEOUS

## 2014-07-20 MED ORDER — SODIUM CHLORIDE 0.9 % IV SOLN
INTRAVENOUS | Status: DC
  Administered 2014-07-20 – 2014-07-23 (×3): via INTRAVENOUS
  Administered 2014-07-24: 50 mL/h via INTRAVENOUS
  Administered 2014-07-25: 15:00:00 via INTRAVENOUS

## 2014-07-20 MED ORDER — ONDANSETRON HCL 4 MG/2ML IJ SOLN: 4.0000 mg | Freq: Four times a day (QID) | INTRAMUSCULAR | Status: DC | PRN

## 2014-07-20 MED ORDER — SPIRONOLACTONE 25 MG PO TABS
25.0000 mg | ORAL_TABLET | Freq: Every morning | ORAL | Status: DC
  Administered 2014-07-21 – 2014-07-25 (×5): 25 mg via ORAL
  Filled 2014-07-20 (×5): qty 1

## 2014-07-20 MED ORDER — FAMOTIDINE 20 MG PO TABS
20.0000 mg | ORAL_TABLET | Freq: Every day | ORAL | Status: DC
  Administered 2014-07-20 – 2014-07-21 (×2): 20 mg via ORAL
  Filled 2014-07-20 (×3): qty 1

## 2014-07-20 MED ORDER — ACETAMINOPHEN 325 MG PO TABS
650.0000 mg | ORAL_TABLET | Freq: Four times a day (QID) | ORAL | Status: DC | PRN
  Administered 2014-07-21 (×2): 650 mg via ORAL
  Filled 2014-07-20 (×2): qty 2

## 2014-07-20 NOTE — ED Provider Notes (Signed)
Medical screening examination/treatment/procedure(s) were performed by non-physician practitioner and as supervising physician I was immediately available for consultation/collaboration.   EKG Interpretation None        Francine Graven, DO 07/20/14 1555

## 2014-07-20 NOTE — ED Notes (Signed)
Bed: OI71 Expected date: 07/20/14 Expected time: 12:28 PM Means of arrival: Ambulance Comments: Pain all over

## 2014-07-20 NOTE — ED Notes (Signed)
Per EMS pt coming from home with c/o generalized pain, denies SOB, chest pain, n/v/d. Was seen [facility] ED two days ago for same and diagnosed with plantar fasciitis. Today pt claims she can't get up or even move on her own. Family is requesting rehab placement.

## 2014-07-20 NOTE — Progress Notes (Signed)
Received report at 1656. Awaiting transport from [place] to here.

## 2014-07-20 NOTE — ED Provider Notes (Signed)
CSN: [id number]     Arrival date & time 07/20/14  1238 History   First MD Initiated Contact with Patient 07/20/14 1332     Chief Complaint  Patient presents with  . Pain     (Consider location/radiation/quality/duration/timing/severity/associated sxs/prior Treatment) HPI Comments: Pt presents with generalized pain, weakness, malaise. She has been unable to get OOB. She was seen for BL feet pain on 8/20, was diagnosed w/ planar fascitis, but has not followed instructions to get orthotics. She denies fever, chills, n/v, d/a urinary symptoms. She states she has had poor appetitive but has been drinking a lot of water.   The history is provided by the patient. No language interpreter was used.    Past Medical History  Diagnosis Date  . Hypertension   . Diabetes mellitus   . Depression   . Asthma   . Eczema   . Arthritis   . Cataract   . GERD (gastroesophageal reflux disease)   . History of kidney stones     w/ hx of hydronephrosis - followed by [facility]  . Anemia   . Obesity   . HIV nonspecific serology     2006: indeterminate HIV blood test, seen by ID, felt secondary to cross reacting antibodies with no further workup felt necessary at that time   . Fatty liver     Fatty infiltration of liver noted on 03/2012 CT scan  . CHF, acute 05/03/2012   Past Surgical History  Procedure Laterality Date  . Cystoscopy w/ litholapaxy / ehl    . Cholecystectomy  2003  . Replacement total knee bilateral  2005 &2006  . Joint replacement      bilateral knee replacement  . Vascular surgery Right 03/15/2013    Ultrasound guided sclerotherapy   Family History  Problem Relation Age of Onset  . Diabetes Mother   . Stroke Mother   . Heart disease Father   . Anemia Father    History  Substance Use Topics  . Smoking status: Never Smoker   . Smokeless tobacco: Never Used  . Alcohol Use: No   OB History   Grav Para Term Preterm Abortions TAB SAB Ect Mult Living                  Review of Systems  Constitutional: Positive for activity change and appetite change. Negative for fever, chills, diaphoresis and fatigue.  HENT: Negative for congestion, facial swelling, rhinorrhea and sore throat.   Eyes: Negative for photophobia and discharge.  Respiratory: Negative for cough, chest tightness and shortness of breath.   Cardiovascular: Negative for chest pain, palpitations and leg swelling.  Gastrointestinal: Negative for nausea, vomiting, abdominal pain and diarrhea.  Endocrine: Negative for polydipsia and polyuria.  Genitourinary: Negative for dysuria, frequency, difficulty urinating and pelvic pain.  Musculoskeletal: Negative for arthralgias, back pain, neck pain and neck stiffness.  Skin: Negative for color change and wound.  Allergic/Immunologic: Negative for immunocompromised state.  Neurological: Positive for weakness. Negative for facial asymmetry, numbness and headaches.  Hematological: Does not bruise/bleed easily.  Psychiatric/Behavioral: Negative for confusion and agitation.      Allergies  Review of patient's allergies indicates no known allergies.  Home Medications   Prior to Admission medications   Medication Sig Start Date End Date Taking? Authorizing Provider  albuterol (PROVENTIL HFA;VENTOLIN HFA) 108 (90 BASE) MCG/ACT inhaler Inhale 2 puffs into the lungs every 6 (six) hours as needed for wheezing or shortness of breath.   Yes Historical Provider, MD  aspirin 81 MG tablet Take 81 mg by mouth daily.   Yes Historical Provider, MD  baclofen (LIORESAL) 10 MG tablet Take 10 mg by mouth 3 (three) times daily as needed for muscle spasms.   Yes Historical Provider, MD  benazepril (LOTENSIN) 20 MG tablet Take 1 tablet (20 mg total) by mouth daily. 08/27/13  Yes Lind Covert, MD  fish oil-omega-3 fatty acids 1000 MG capsule Take 1 g by mouth 2 (two) times daily.   Yes Historical Provider, MD  fluticasone (FLOVENT HFA) 110 MCG/ACT inhaler Inhale 2  puffs into the lungs 2 (two) times daily. 12/03/13  Yes Blane Ohara McDiarmid, MD  furosemide (LASIX) 40 MG tablet Take 80 mg by mouth every morning.    Yes Historical Provider, MD  gabapentin (NEURONTIN) 300 MG capsule Take 600 mg by mouth 3 (three) times daily.  02/06/14  Yes Harriet Masson, DPM  HYDROcodone-acetaminophen (NORCO/VICODIN) 5-325 MG per tablet Take 1 tablet by mouth every 12 (twelve) hours as needed for moderate pain.   Yes Historical Provider, MD  metFORMIN (GLUCOPHAGE) 500 MG tablet Take 0.5 tablets (250 mg total) by mouth 2 (two) times daily with a meal. 08/27/13  Yes Lind Covert, MD  metoprolol tartrate (LOPRESSOR) 25 MG tablet take 1 tablet by mouth twice a day 06/25/14  Yes Lind Covert, MD  potassium citrate (UROCIT-K 10) 10 MEQ (1080 MG) SR tablet Take 10 mEq by mouth 2 (two) times daily.    Yes Historical Provider, MD  ranitidine (ZANTAC) 150 MG tablet Take 150 mg by mouth at bedtime.   Yes Historical Provider, MD  spironolactone (ALDACTONE) 25 MG tablet Take 25 mg by mouth every morning.    Yes Historical Provider, MD  trolamine salicylate (ASPERCREME) 10 % cream Apply 1 application topically as needed for muscle pain.    Yes Historical Provider, MD   BP 126/70  Pulse 115  Temp(Src) 99.4 F (37.4 C) (Oral)  Resp 16  SpO2 100% Physical Exam  Constitutional: She is oriented to person, place, and time. She appears well-developed and well-nourished. No distress.  HENT:  Head: Normocephalic and atraumatic.  Mouth/Throat: No oropharyngeal exudate.  Eyes: Pupils are equal, round, and reactive to light.  Neck: Normal range of motion. Neck supple.  Cardiovascular: Normal rate, regular rhythm and normal heart sounds.  Exam reveals no gallop and no friction rub.   No murmur heard. Pulmonary/Chest: Effort normal and breath sounds normal. No respiratory distress. She has no wheezes. She has no rales.  Abdominal: Soft. Bowel sounds are normal. She exhibits no distension  and no mass. There is no tenderness. There is no rebound and no guarding.  Musculoskeletal: Normal range of motion. She exhibits no edema and no tenderness.       Feet:  Neurological: She is alert and oriented to person, place, and time.  Skin: Skin is warm and dry.  Psychiatric: She has a normal mood and affect.    ED Course  Procedures (including critical care time) Labs Review Labs Reviewed  COMPREHENSIVE METABOLIC PANEL - Abnormal; Notable for the following:    Sodium 131 (*)    Chloride 88 (*)    Glucose, Bld 247 (*)    BUN 35 (*)    Creatinine, Ser 1.53 (*)    Total Protein 9.1 (*)    Albumin 2.8 (*)    Alkaline Phosphatase 120 (*)    GFR calc non Af Amer 35 (*)    GFR calc Af Amer 40 (*)  Anion gap 19 (*)    All other components within normal limits  URINALYSIS, ROUTINE W REFLEX MICROSCOPIC - Abnormal; Notable for the following:    APPearance CLOUDY (*)    Urobilinogen, UA 2.0 (*)    Leukocytes, UA MODERATE (*)    All other components within normal limits  URINE MICROSCOPIC-ADD ON - Abnormal; Notable for the following:    Bacteria, UA MANY (*)    Casts GRANULAR CAST (*)    All other components within normal limits  CBC WITH DIFFERENTIAL - Abnormal; Notable for the following:    WBC 24.3 (*)    RBC 2.73 (*)    Hemoglobin 7.4 (*)    HCT 23.5 (*)    RDW 17.0 (*)    Platelets 413 (*)    Neutro Abs 18.7 (*)    Lymphocytes Relative 10 (*)    Monocytes Relative 13 (*)    Monocytes Absolute 3.2 (*)    All other components within normal limits  URINE CULTURE  CBC WITH DIFFERENTIAL    Imaging Review No results found.   EKG Interpretation None      MDM   Final diagnoses:  UTI (lower urinary tract infection)  Leukocytosis    Pt is a 63 y.o. female with Pmhx as above who presents with generalized pain, weakness, malaise. She has been unable to get OOB. She was seen for BL feet pain on 8/20, was diagnosed w/ planar fascitis, but has not followed  instructions to get orthotics. On PE, rectal temp 100.6, HT 110's, BP stable. Pulm exam benign, abdominal pain benign. She has significant pain w/ palpation to BLLE, but is NVI.  Labs with worsening leukocytosis, mild hyponatremia/hypochloremia. Urine infected. IV rocephin ordered, pt will be admitted to family practice at [facility].         Ernestina Patches, MD 07/20/14 [phone]

## 2014-07-20 NOTE — ED Notes (Signed)
1st attempt to give report: RN unavailable, they will call back.

## 2014-07-20 NOTE — Progress Notes (Signed)
Pt admitted to the unit at 1815. Pt mental status is A&Ox4. Pt oriented to room, staff, and call bell. Skin is intact but dry/flaky on feet. Full assessment charted in [facility]. Call bell within reach. Visitor guidelines reviewed w/ pt and/or family.

## 2014-07-20 NOTE — ED Notes (Signed)
Report given to [facility]. 

## 2014-07-20 NOTE — H&P (Signed)
[facility] Admission History and Physical Service Pager: [phone]  Patient name: [name] Medical record number: [id number] Date of birth: 24-Mar-1951 Age: 63 y.o. Gender: female  Primary Care Provider: Lind Covert, MD Consultants: none to date Code Status: full  Chief Complaint: generalized weakness, bilateral foot pain  Assessment and Plan: [name] is a 63 y.o. female presenting with generalized weakness / malaise for about 1 week as well as severe bilateral foot pain and inability to ambulate well at home, found to have UA suggestive of UTI with fever, WBC >24, and anemia worse than previously but no signs of active bleeding. PMH significant for DM type II, HTN, chronic normocytic anemia, systolic and diastolic CHF, asthma, osteoarthritis with chronic pain / muscle spasm; recently admitted for acute encephalopathy felt to be related to overuse of baclofen at home, and recently discharged from acute SNF following that hospitalization.  #Acute pyelonephritis - UA findings suggestive of infection, vague urinary complaints, elevated WBC, and fever - s/p CTX x1 in the ED, will continue - urine culture obtained prior to abx, will add blood cx though pt already received CTX - will catheterize if pt is unable to void; monitor I&O, daily weights given CHF (see below) - Tylenol for fever / pain, Zofran for nausea, morphine for pain - trend WBC, kidney function  #Generalized weakness - uncertain etiology, likely multifactorial (current infection, foot pain, anemia) - PT / OT ordered for eval, may need to consider repeat SNF stay or [facility] services - monitor labs, consider transfusion if Hb trends down or precipitously drops (see individual problems below) - total protein elevated with albumin low, will need to trend  #Acute kidney injury - Cr 1.53 on admission, with GFR 40 (baseline appears normal as recently as Sept 2014-Feb 2015) - poor PO intake  recently in setting of acute infection combined with ACE use makes prerenal etiology most likely - trend BMP and rehydrate with gentle IVF; hold Lasix, ACE for now  #Foot pain / hx chronic pain - unclear etiology, also likely multifactorial with hx osteoarthritis, ?plantar fasciitis recently diagnosed outpt, increased swelling in feet possibly due to CHF, and elevated uric acid at last hospitalization - will manage with pain medication acutely (morphine PRN), and continue home baclofen, Neurontin - PT / OT eval to check for functional status - see also CHF below  #CHF - last echo October 2014, with EF 55-60 and grade 1 diastolic dysfunction (prior in 2013 with EF 73-22, grade 2 diastolic dysfunction), on spironolactone 25 mg daily, benazepril 20 mg daily, metoprolol 25 mg BID, Lasix 80 mg PO daily at home - not acutely fluid overloaded on lung exam, but with feet swelling, as above - continue home meds, for now, though hold ACE and Lasix while in acute kidney injury - recent BNP was in normal range, will recheck in the AM - repeat echo to check for deteriorating function  #Normocytic anemia - chronic for at least the past few years, with recent baseline in 8-10 range - Hb on admission 7.4, though no clear signs of active bleeding - will check FOBT, iron studies, and trend CBC - avoid transfusion if possible; may need to consider IV and/or outpt PO supplement iron given CHF  #HTN - BP's generally in a well-controlled range; holding ACE / Lasix while in acute kidney injury - ordered for hydralazine PRN for BP >160 / 90  #DM type II - on metformin and diet control, at home; recent  A1c 7.1 (up from 6.1 earlier this year) - hold metformin while admitted, especially with AKI - ordered for SSI and CBG's qAC+HS  #Asthma - no acute respiratory distress or wheezing; continue home albuterol PRN  #GERD - on Zantac daily at home, sub Pepcid while admitted  FEN/GI: NS @50  mL/h, heart-healthy /  carb-modified diet Prophylaxis: subQ heparin  Disposition: admit to telemetry bed, attending Dr. Mingo Amber, with management as above; discharge planning pending improvement in pain and eval for generalized weakness -- anticipate pt may need HH and/or further rehab (SNF or similar setting)  History of Present Illness: [name] is a 63 y.o. female presenting with generalized weakness / malaise for about 1 week as well as severe bilateral foot pain and inability to ambulate well at home. PMH significant for DM type II, HTN, chronic normocytic anemia, systolic and diastolic CHF, asthma, osteoarthritis with chronic pain / muscle spasm; recently admitted for acute encephalopathy felt to be related to overuse of baclofen at home, and recently discharged from acute SNF following that hospitalization.  Pt reports she has been doing well at home after discharge from SNF, but for the past week she has felt very weak, tired, and unable to get around well. She denies fevers, chills, N/V, chest pain, or SOB, but has had some episodes of increased urination frequency and difficulty holding her bladder (during the previous admission mentioned above, she had some urinary RETENTION, which she no longer complains of). She has had back pain that is vaguely described during this time as awell, as well as significant bilateral foot pain; she states her foot pain has been worse around the same time period and she was seen in the ED on 8/20 (has not followed up to get orthotics or used any other specific treatments), but per record review she was seen in outpt PCP clinic and diagnosed with plantar fasciitis on 8/14.  She reports compliance with all her home meds but reports she feels like she is "just not doing well" in general, and feels she may need more therapy / rehab. She has not been eating well and states she has just been "drinking a lot of water." In the ED at [facility], she was found to have WBC 24.3, Hb 7.4, mild elevation  of her creatinine, low sodium and chloride, and UA findings suggestive of UTI and rectal temp of >100.4.  Review Of Systems: Per HPI. Otherwise 12 point review of systems was performed and was unremarkable.  Patient Active Problem List   Diagnosis Date Noted  . UTI (lower urinary tract infection) 07/20/2014  . Medication management 07/12/2014  . Wrist pain, right 07/12/2014  . Plantar fasciitis 07/12/2014  . Plantar fasciitis, bilateral 07/12/2014  . Altered mental status 06/02/2014  . Acute encephalopathy 06/02/2014  . Back pain 05/30/2014  . Tremor 05/30/2014  . Type 2 diabetes mellitus with diabetic foot deformity 01/01/2014  . Systolic CHF, chronic 42/59/5638  . OBESITY 02/05/2009  . ASTHMA, PERSISTENT, MILD 02/08/2008  . DIABETES MELLITUS 05/18/2007  . DISORDER, DEPRESSIVE NEC 05/18/2007  . HYPERTENSION, BENIGN SYSTEMIC 01/26/2007  . NEPHROLITHIASIS 01/26/2007  . OSTEOARTHRITIS, MULTI SITES 01/26/2007   Past Medical History: Past Medical History  Diagnosis Date  . Hypertension   . Diabetes mellitus   . Depression   . Asthma   . Eczema   . Arthritis   . Cataract   . GERD (gastroesophageal reflux disease)   . History of kidney stones     w/ hx of  hydronephrosis - followed by [facility]  . Anemia   . Obesity   . HIV nonspecific serology     2006: indeterminate HIV blood test, seen by ID, felt secondary to cross reacting antibodies with no further workup felt necessary at that time   . Fatty liver     Fatty infiltration of liver noted on 03/2012 CT scan  . CHF, acute 05/03/2012   Past Surgical History: Past Surgical History  Procedure Laterality Date  . Cystoscopy w/ litholapaxy / ehl    . Cholecystectomy  2003  . Replacement total knee bilateral  2005 &2006  . Joint replacement      bilateral knee replacement  . Vascular surgery Right 03/15/2013    Ultrasound guided sclerotherapy   Social History: History  Substance Use Topics  . Smoking status: Never  Smoker   . Smokeless tobacco: Never Used  . Alcohol Use: No   Please also refer to relevant sections of EMR.  Family History: Family History  Problem Relation Age of Onset  . Diabetes Mother   . Stroke Mother   . Heart disease Father   . Anemia Father    Allergies and Medications: No Known Allergies No current facility-administered medications on file prior to encounter.   Current Outpatient Prescriptions on File Prior to Encounter  Medication Sig Dispense Refill  . albuterol (PROVENTIL HFA;VENTOLIN HFA) 108 (90 BASE) MCG/ACT inhaler Inhale 2 puffs into the lungs every 6 (six) hours as needed for wheezing or shortness of breath.      Marland Kitchen aspirin 81 MG tablet Take 81 mg by mouth daily.      . benazepril (LOTENSIN) 20 MG tablet Take 1 tablet (20 mg total) by mouth daily.  90 tablet  3  . fish oil-omega-3 fatty acids 1000 MG capsule Take 1 g by mouth 2 (two) times daily.      . fluticasone (FLOVENT HFA) 110 MCG/ACT inhaler Inhale 2 puffs into the lungs 2 (two) times daily.  12 g  6  . furosemide (LASIX) 40 MG tablet Take 80 mg by mouth every morning.       . gabapentin (NEURONTIN) 300 MG capsule Take 600 mg by mouth 3 (three) times daily.       Marland Kitchen HYDROcodone-acetaminophen (NORCO/VICODIN) 5-325 MG per tablet Take 1 tablet by mouth every 12 (twelve) hours as needed for moderate pain.      . metFORMIN (GLUCOPHAGE) 500 MG tablet Take 0.5 tablets (250 mg total) by mouth 2 (two) times daily with a meal.  90 tablet  3  . metoprolol tartrate (LOPRESSOR) 25 MG tablet take 1 tablet by mouth twice a day  60 tablet  11  . potassium citrate (UROCIT-K 10) 10 MEQ (1080 MG) SR tablet Take 10 mEq by mouth 2 (two) times daily.       . ranitidine (ZANTAC) 150 MG tablet Take 150 mg by mouth at bedtime.      Marland Kitchen spironolactone (ALDACTONE) 25 MG tablet Take 25 mg by mouth every morning.       . trolamine salicylate (ASPERCREME) 10 % cream Apply 1 application topically as needed for muscle pain.          Objective: BP 145/76  Pulse 119  Temp(Src) 102.3 F (39.1 C) (Rectal)  Resp 20  SpO2 99% Exam: General: elderly adult female in NAD, but appears uncomfortable HEENT: NCAT, MMM slightly dry, PERRLA Cardiovascular: regular rhythm, tachycardic, and  Respiratory: CTAB, no wheezes, speaks in full sentences on room air  Abdomen: soft, not focally tender, nondistended, no peritoneal signs, BS+ MSK: marked CVA tenderness, difficult to appreciate one side worse than the other Extremities: LE with significant edema bilaterally in feet  dried skin and flaking but no frank skin breakdown of feet  Marked bunions to bilateral feet, chronic per pt Skin: dry skin to feet bilaterally, otherwise warm, intact Neuro: alert, oriented, without gross focal deficit, sensation grossly intact  Strength 4+/5 in bilateral upper and lower extremities; poor effort secondary to pain especially in LE bilaterally  Labs and Imaging: CBC BMET   Recent Labs Lab 07/20/14 1529  WBC 24.3*  HGB 7.4*  HCT 23.5*  PLT 413*    Recent Labs Lab 07/20/14 1342  NA 131*  K 4.9  CL 88*  CO2 24  BUN 35*  CREATININE 1.53*  GLUCOSE 247*  CALCIUM 10.0      Recent Labs Lab 07/18/14 1030 07/20/14 1342  NA 136* 131*  K 4.5 4.9  CL 94* 88*  CO2 28 24  GLUCOSE 228* 247*  BUN 31* 35*  CREATININE 1.41* 1.53*  CALCIUM 9.8 10.0  ALKPHOS 85 120*  AST 9 16  ALT 6 8  ALBUMIN 3.1* 2.8*   UA 8/22 @1330 : cloudy appearance, moderate leukocytes, many bacteria, and granular casts  Micro: Urine culture 8/22 @1330 : PENDING  Emmaline Kluver, MD 07/20/2014, 5:55 PM PGY-3, [facility] Intern pager: [phone], text pages welcome

## 2014-07-21 ENCOUNTER — Inpatient Hospital Stay (HOSPITAL_COMMUNITY): Payer: PRIVATE HEALTH INSURANCE

## 2014-07-21 LAB — COMPREHENSIVE METABOLIC PANEL
ALBUMIN: 2.4 g/dL — AB (ref 3.5–5.2)
ALT: 7 U/L (ref 0–35)
AST: 11 U/L (ref 0–37)
Alkaline Phosphatase: 108 U/L (ref 39–117)
Anion gap: 14 (ref 5–15)
BUN: 28 mg/dL — AB (ref 6–23)
CO2: 27 mEq/L (ref 19–32)
CREATININE: 1.25 mg/dL — AB (ref 0.50–1.10)
Calcium: 9.1 mg/dL (ref 8.4–10.5)
Chloride: 93 mEq/L — ABNORMAL LOW (ref 96–112)
GFR calc Af Amer: 51 mL/min — ABNORMAL LOW (ref >90)
GFR calc non Af Amer: 44 mL/min — ABNORMAL LOW (ref >90)
Glucose, Bld: 243 mg/dL — ABNORMAL HIGH (ref 70–99)
Potassium: 4.9 mEq/L (ref 3.7–5.3)
Sodium: 134 mEq/L — ABNORMAL LOW (ref 137–147)
TOTAL PROTEIN: 7.6 g/dL (ref 6.0–8.3)
Total Bilirubin: 0.8 mg/dL (ref 0.3–1.2)

## 2014-07-21 LAB — IRON AND TIBC
IRON: 10 ug/dL — AB (ref 42–135)
Saturation Ratios: 5 % — ABNORMAL LOW (ref 20–55)
TIBC: 202 ug/dL — ABNORMAL LOW (ref 250–470)
UIBC: 192 ug/dL (ref 125–400)

## 2014-07-21 LAB — URIC ACID: URIC ACID, SERUM: 12.6 mg/dL — AB (ref 2.4–7.0)

## 2014-07-21 LAB — CBC
HCT: 24.4 % — ABNORMAL LOW (ref 36.0–46.0)
HEMOGLOBIN: 7.1 g/dL — AB (ref 12.0–15.0)
MCH: 25.4 pg — ABNORMAL LOW (ref 26.0–34.0)
MCHC: 29.1 g/dL — ABNORMAL LOW (ref 30.0–36.0)
MCV: 87.1 fL (ref 78.0–100.0)
Platelets: ADEQUATE 10*3/uL (ref 150–400)
RBC: 2.8 MIL/uL — AB (ref 3.87–5.11)
RDW: 17.1 % — ABNORMAL HIGH (ref 11.5–15.5)
WBC: 19.2 10*3/uL — ABNORMAL HIGH (ref 4.0–10.5)

## 2014-07-21 LAB — GLUCOSE, CAPILLARY
GLUCOSE-CAPILLARY: 152 mg/dL — AB (ref 70–99)
Glucose-Capillary: 251 mg/dL — ABNORMAL HIGH (ref 70–99)
Glucose-Capillary: 218 mg/dL — ABNORMAL HIGH (ref 70–99)
Glucose-Capillary: 223 mg/dL — ABNORMAL HIGH (ref 70–99)

## 2014-07-21 LAB — MAGNESIUM: MAGNESIUM: 1.7 mg/dL (ref 1.5–2.5)

## 2014-07-21 LAB — PRO B NATRIURETIC PEPTIDE: Pro B Natriuretic peptide (BNP): 199.1 pg/mL — ABNORMAL HIGH (ref 0–125)

## 2014-07-21 LAB — FERRITIN: FERRITIN: 654 ng/mL — AB (ref 10–291)

## 2014-07-21 MED ORDER — INSULIN ASPART 100 UNIT/ML ~~LOC~~ SOLN
0.0000 [IU] | Freq: Three times a day (TID) | SUBCUTANEOUS | Status: DC
  Administered 2014-07-21 – 2014-07-22 (×3): 7 [IU] via SUBCUTANEOUS

## 2014-07-21 MED ORDER — IBUPROFEN 400 MG PO TABS
400.0000 mg | ORAL_TABLET | Freq: Once | ORAL | Status: AC
  Administered 2014-07-21: 400 mg via ORAL
  Filled 2014-07-21: qty 1

## 2014-07-21 MED ORDER — DOCUSATE SODIUM 100 MG PO CAPS
100.0000 mg | ORAL_CAPSULE | Freq: Every day | ORAL | Status: DC
  Administered 2014-07-21 – 2014-07-25 (×4): 100 mg via ORAL
  Filled 2014-07-21 (×7): qty 1

## 2014-07-21 MED ORDER — PIPERACILLIN-TAZOBACTAM 3.375 G IVPB
3.3750 g | Freq: Three times a day (TID) | INTRAVENOUS | Status: DC
  Administered 2014-07-21 – 2014-07-24 (×10): 3.375 g via INTRAVENOUS
  Filled 2014-07-21 (×11): qty 50

## 2014-07-21 MED ORDER — VANCOMYCIN HCL IN DEXTROSE 750-5 MG/150ML-% IV SOLN
750.0000 mg | Freq: Two times a day (BID) | INTRAVENOUS | Status: DC
  Administered 2014-07-21 – 2014-07-23 (×5): 750 mg via INTRAVENOUS
  Filled 2014-07-21 (×6): qty 150

## 2014-07-21 MED ORDER — INSULIN ASPART 100 UNIT/ML ~~LOC~~ SOLN
6.0000 [IU] | Freq: Three times a day (TID) | SUBCUTANEOUS | Status: DC
  Administered 2014-07-21 – 2014-07-22 (×3): 6 [IU] via SUBCUTANEOUS

## 2014-07-21 MED ORDER — FERROUS SULFATE 325 (65 FE) MG PO TABS
325.0000 mg | ORAL_TABLET | Freq: Two times a day (BID) | ORAL | Status: DC
  Filled 2014-07-21 (×2): qty 1

## 2014-07-21 NOTE — Progress Notes (Signed)
Temp trending earlier today. Temp is not 103. PRN tylenol given. Will monitor and notify MD.

## 2014-07-21 NOTE — Progress Notes (Signed)
Temp still high. 102.2- Ibuprofen being given now. Will recheck

## 2014-07-21 NOTE — Progress Notes (Signed)
Pt temp 101.6. MD paged and given 650 mg of tylenol. Reassessment of temp was 101.2. MD notified and stated they will start pt on IV vanc and zosyn and will consult with day team about further management. Will continue to monitor pt.

## 2014-07-21 NOTE — Progress Notes (Signed)
ANTIBIOTIC CONSULT NOTE - INITIAL  Pharmacy Consult for Vancomycin/Zosyn  Indication: Pyelonephritis  No Known Allergies  Patient Measurements: Height: 5\' 2"  (157.5 cm) Weight: 214 lb 14.4 oz (97.478 kg) IBW/kg (Calculated) : 50.1  Vital Signs: Temp: 101.2 F (38.4 C) (08/23 0655) Temp src: Oral (08/23 0515) BP: 108/65 mmHg (08/23 0515) Pulse Rate: 107 (08/23 0515)  Labs:  Recent Labs  07/18/14 1030 07/20/14 1342 07/20/14 1529 07/21/14 0520  WBC 21.5*  --  24.3* PENDING  HGB 7.9*  --  7.4* 7.1*  PLT 338  --  413* PENDING  CREATININE 1.41* 1.53*  --  1.25*   Estimated Creatinine Clearance: 48.9 ml/min (by C-G formula based on Cr of 1.25).  Medical History: Past Medical History  Diagnosis Date  . Hypertension   . Diabetes mellitus   . Depression   . Asthma   . Eczema   . Arthritis   . Cataract   . GERD (gastroesophageal reflux disease)   . History of kidney stones     w/ hx of hydronephrosis - followed by [facility]  . Anemia   . Obesity   . HIV nonspecific serology     2006: indeterminate HIV blood test, seen by ID, felt secondary to cross reacting antibodies with no further workup felt necessary at that time   . Fatty liver     Fatty infiltration of liver noted on 03/2012 CT scan  . CHF, acute 05/03/2012    Assessment: 63 y/o F to start vancomycin/zosyn for pyelo with leukocytosis, abnormal U/A, and fever. Mild renal dysfunction present. Other labs as above. Already received CTX x 1.   Goal of Therapy:  Vancomycin trough level 15-20 mcg/ml  Plan:  -Vancomycin 750 mg IV q12h -Zosyn 3.375G IV q8h to be infused over 4 hours -Trend WBC, temp, renal function  -Drug levels as indicated -F/U cultures and de-escalate ASAP to monotherapy if possible  Narda Bonds 07/21/2014,7:10 AM

## 2014-07-21 NOTE — Progress Notes (Signed)
Family Medicine Teaching Service Daily Progress Note Intern Pager: [phone]  Patient name: [name] Medical record number: [id number] Date of birth: Apr 25, 1951 Age: 63 y.o. Gender: female  Primary Care Provider: Lind Covert, MD Consultants: None Code Status: Full  Pt Overview and Major Events to Date:  - Pt. Admitted for pyelonephritis, weakness / malaise.   Assessment and Plan:  [name] is a 63 y.o. female presenting with generalized weakness / malaise for about 1 week as well as severe bilateral foot pain and inability to ambulate well at home, found to have UA suggestive of UTI with fever, WBC >24, and anemia worse than previously but no signs of active bleeding. PMH significant for DM type II, HTN, chronic normocytic anemia, systolic and diastolic CHF, asthma, osteoarthritis with chronic pain / muscle spasm; recently admitted for acute encephalopathy felt to be related to overuse of baclofen at home, and recently discharged from acute SNF following that hospitalization.   #Acute pyelonephritis - UA findings suggestive of infection, vague urinary complaints, elevated WBC, and fever  - s/p CTX x1 in the ED, continued, but began spiking temps 101.6 >101.2 this am, Cx drawn during febrile episode.  - urine culture obtained prior to abx, blood cx added after abx as well and obtained during am febrile episode.  - will catheterize if pt is unable to void; monitor I&O, daily weights given CHF (see below)  - Tylenol for fever / pain, Zofran for nausea, morphine for pain  - trend WBC 24 >19.2, kidney function improving - Ceftriaxone discontinued and broadened to Vanc / Zsyn with dosing per pharmacy. Given continued spiking fevers.  - Renal ultrasound today.   #Generalized weakness - uncertain etiology, likely multifactorial (current infection, foot pain, anemia)  - PT / OT ordered for eval, may need to consider repeat SNF stay or [facility] services  - monitor labs, consider  transfusion if Hb trends down or precipitously drops (see individual problems below)  - total protein elevated with albumin low, will need to trend. Total protein 7.6, albumin 2.4, gamma gap 5.2., Corrected calcium 10.4 - SPEP today for further evaluation.   #Acute kidney injury - Cr 1.53 on admission, with GFR 40 (baseline appears normal as recently as Sept 2014-Feb 2015)  - poor PO intake recently in setting of acute infection combined with ACE use makes prerenal etiology most likely  - trend BMP and rehydrate with gentle IVF; hold Lasix, ACE for now, Cr 1.25 this am.  - SPEP and trending clacium / proteins as above.   #Foot pain / hx chronic pain - unclear etiology, also likely multifactorial with hx osteoarthritis, ?plantar fasciitis recently diagnosed outpt, increased swelling in feet possibly due to CHF, and elevated uric acid at last hospitalization  - will manage with pain medication acutely (morphine PRN), and continue home baclofen, Neurontin  - PT / OT eval to check for functional status  - Concern for acute gouty episode. Uric acid level today. Will f/u and treat as appropriate.  - Also with physical exam suggestive of improving edema.  - see also CHF below   #CHF - last echo October 2014, with EF 55-60 and grade 1 diastolic dysfunction (prior in 2013 with EF 32-20, grade 2 diastolic dysfunction), on spironolactone 25 mg daily, benazepril 20 mg daily, metoprolol 25 mg BID, Lasix 80 mg PO daily at home  - not acutely fluid overloaded on lung exam, foot swelling improvement per physical exam. - continue home meds, for now, though hold  ACE and Lasix while in acute kidney injury  - BNP elevated 199.1 this am. Will continue to monitor fluid status. Holding lasix for now given kidney injury.  - +0.5L - echo pending.   #Normocytic anemia - chronic for at least the past few years, with recent baseline in 8-10 range  - Hb on admission 7.4, though no clear signs of active bleeding  - will  check FOBT, iron studies pending, and trend CBC  - avoid transfusion if possible; may need to consider IV iron as outpt.  - No po iron given current infection.    #HTN - BP's generally in a well-controlled range; holding ACE / Lasix while in acute kidney injury  - ordered for hydralazine PRN for BP >160 / 90   #DM type II - on metformin and diet control, at home; recent A1c 7.1 (up from 6.1 earlier this year)  - hold metformin while admitted, especially with AKI  - ordered for SSI and CBG's qAC+HS  - Glucose 240's. Continue to monitor and switch to resistant SSI if needed.   #Asthma - no acute respiratory distress or wheezing; continue home albuterol PRN   #GERD - on Zantac daily at home, sub Pepcid while admitted  FEN/GI: NS @50  mL/h, heart-healthy / carb-modified diet  Prophylaxis: subQ heparin   Disposition: Will continue to monitor her tenuous status given spiking fevers, and concern for pyelonephritis. Will f/u renal ultrasound and adjust therapy as indicated.   Subjective:  Pt. States that she does not feel well this am. She says that she has "pain all over". She denies nausea / vomiting, but endorses subjective fevers as well. She says that she has severe foot pain, with tenderness to even light touch. She says that she was fine previous to this and does not know what could be going on. She otherwise does not feel SOB or palpitations at this time. She says that she can attempt to eat breakfast this am.   Objective: Temp:  [99.1 F (37.3 C)-102.3 F (39.1 C)] 101.2 F (38.4 C) (08/23 0655) Pulse Rate:  [107-124] 107 (08/23 0515) Resp:  [16-20] 20 (08/23 0515) BP: (108-145)/(63-80) 108/65 mmHg (08/23 0515) SpO2:  [94 %-100 %] 98 % (08/23 0915) Weight:  [214 lb 14.4 oz (97.478 kg)-225 lb 3.2 oz (102.15 kg)] 214 lb 14.4 oz (97.478 kg) (08/23 0515) Physical Exam: General: NAD, does not desire to sit up 2/2 pain, AAOx3 Cardiovascular: RRR, no MGR Respiratory: CTA Bilaterally  given limited pulmonary exam as pt. Would not sit up for full exam. No audible crackles, rales, or wheezes.  Abdomen: Soft, nontender, nondistended, +BS Extremities: Hands somewhat puffy, Feet with significant edema (likely 2-3+, but unable to evaluate due to tenderness), dorsal / pedal tenderness, 2+ Posterior Tibial pulses bilaterallly, TTP over 1st metatarsal bilaterally. Somewhat limited exam however.   Laboratory:  Recent Labs Lab 07/18/14 1030 07/20/14 1529 07/21/14 0520  WBC 21.5* 24.3* 19.2*  HGB 7.9* 7.4* 7.1*  HCT 24.7* 23.5* 24.4*  PLT 338 413* PLATELET CLUMPS NOTED ON SMEAR, COUNT APPEARS ADEQUATE    Recent Labs Lab 07/18/14 1030 07/20/14 1342 07/21/14 0520  NA 136* 131* 134*  K 4.5 4.9 4.9  CL 94* 88* 93*  CO2 28 24 27   BUN 31* 35* 28*  CREATININE 1.41* 1.53* 1.25*  CALCIUM 9.8 10.0 9.1  PROT 8.3 9.1* 7.6  BILITOT 0.9 1.0 0.8  ALKPHOS 85 120* 108  ALT 6 8 7   AST 9 16 11   GLUCOSE 228*  247* 243*    Pro BNP -199.1 Glucose - 218  Blood CX / Urine CX - pending  Aquilla Hacker, MD 07/21/2014, 9:44 AM PGY-1 [place] Intern pager: [phone], text pages welcome

## 2014-07-21 NOTE — H&P (Signed)
[facility] Attending Admission Note: Annabell Sabal MD Personal pager:  [phone] FPTS Service Pager:  [phone]  I  have seen and examined this patient, reviewed their chart. I have discussed this patient with the resident. I agree with the resident's findings, assessment and care plan.  Additionally:  63 yo F with weakness and malaise x 1 week found to have symptoms of pyelonephritis.  Also with severe foot pain and swelling BL with inability to ambulate.  Recent admission for altered mental status felt to be secondary to accidental overdose of Baclofen.  Presented to ED yesterday, admitted to [facility].  This AM, she still is febrile.  Main complaint is pain in her feet.  Some back pain, lumbar, though this is bilateral rather than focal.  Still with foot swelling.  On my exam, she appears midly uncomfortable but in no acute distress.  Heart sounds distant but regular.  Lungs clear anterior chest.  Unable to elicit CVA tenderness.  Legs with some mild edema, symmetric and bilateral.  Feet with wrinkling suggestive of post-edematous changes.  TTP along dorsum of both feet BL.  Some chronic appearing hyperpigmentation and with bunion on Left 1st MTP, but no signs/sx's of cellulitis.    Imp/Plan: 1.  Pyelo: - Agree with broadening abx.  Unclear if she has had urinary instrumentation, if so would be concerned for enterococcus.   - Renal US today as still spiking fevers despite broad spectrum abx.  - await urine culture.  Blood cultures obtained after initiation of empiric abx.   2.  BL foot pain: - no signs of infection in feet.  Some LE edema BL though this appears mildly improved from admission per patient and physical examination.  - uric acid is pending, though this is not suggestive of gout pain.   - Would like to hold off but may require imaging of feet for evidence of deeper infection if renal US is normal and pain in feet doesn't abate.   3.  Weakness/malaise: - agree likely multifactorial, acute cause  likely pyelo  4.  Other lab findings: - with borderline to true hypercalcemia since admission (once corrected for albumin).  Also with elevated total protein.  Also with back pain and AKI.  Also with acute on chronic anemia.  Also 63 yo.   - She is african-american and a female, making multiple myeloma less likely.  Will obtain SPEP/UPEP  Alveda Reasons, MD 07/21/2014 11:41 AM

## 2014-07-21 NOTE — Evaluation (Signed)
Physical Therapy Evaluation Patient Details Name: [name] MRN: [id number] DOB: March 11, 1951 Today's Date: 07/21/2014   History of Present Illness  Pt is a 63 y.o. female presenting with generalized weakness / malaise for about 1 week as well as severe bilateral foot pain and inability to ambulate well at home, found to have UA suggestive of UTI with fever, WBC >24, and anemia worse than previously but no signs of active bleeding. PMH significant for DM type II, HTN, chronic normocytic anemia, systolic and diastolic CHF, asthma, osteoarthritis with chronic pain / muscle spasm; recently admitted for acute encephalopathy felt to be related to overuse of baclofen at home, and recently discharged from acute SNF following that hospitalization.  Clinical Impression  Pt admitted with the above. Pt currently with functional limitations due to the deficits listed below (see PT Problem List). At the time of PT eval pt was severely limited by pain in feet, arms, legs, and back. Unable to get to EOB and all mobility was slow and difficult. Pt will benefit from skilled PT to increase their independence and safety with mobility to allow discharge to the venue listed below.       Follow Up Recommendations SNF;Supervision/Assistance - 24 hour    Equipment Recommendations  Rolling walker with 5" wheels;3in1 (PT) (May need w/c if pain does not improve)    Recommendations for Other Services       Precautions / Restrictions Precautions Precautions: Fall Restrictions Weight Bearing Restrictions: No      Mobility  Bed Mobility Overal bed mobility: Needs Assistance;+2 for physical assistance Bed Mobility: Supine to Sit;Sit to Supine     Supine to sit: Max assist;+2 for physical assistance Sit to supine: Total assist;+2 for physical assistance   General bed mobility comments: Pt with increased pain everywhere and because of this had difficulty managing any sort of bed mobility. Bed pad carefully used  to help scoot pt around, however pt so sensitive to touch it was too much for her. Was not able to get all the way to EOB.   Transfers                 General transfer comment: Unable  Ambulation/Gait             General Gait Details: Unable  Stairs            Wheelchair Mobility    Modified Rankin (Stroke Patients Only)       Balance Overall balance assessment: Needs assistance Sitting-balance support: Feet supported;Bilateral upper extremity supported Sitting balance-Leahy Scale: Zero                                       Pertinent Vitals/Pain Pain Assessment: Faces Faces Pain Scale: Hurts worst Pain Location: Pt reports pain in B feet, B UE's (R>L), B shoulders (L>R) Pain Intervention(s): Monitored during session    Home Living Family/patient expects to be discharged to:: Skilled nursing facility Living Arrangements: Alone                    Prior Function Level of Independence: Independent with assistive device(s)         Comments: Used walker and cane occasionally. Does not drive, was doing her own grocery shopping.      Hand Dominance   Dominant Hand: Right    Extremity/Trunk Assessment   Upper Extremity Assessment: Generalized weakness;RUE deficits/detail;LUE  deficits/detail RUE Deficits / Details: Arm is painful to touch. Wrist appears swollen. Pt will not allow any further examination.     LUE Deficits / Details: L shoulder painful to touch, pt will not allow any further examination   Lower Extremity Assessment: Generalized weakness (BLE pain especially in feet. Pt reports no PMH of gout)      Cervical / Trunk Assessment: Normal  Communication   Communication: No difficulties  Cognition Arousal/Alertness: Awake/alert Behavior During Therapy: WFL for tasks assessed/performed Overall Cognitive Status: Within Functional Limits for tasks assessed                      General Comments       Exercises        Assessment/Plan    PT Assessment Patient needs continued PT services  PT Diagnosis Difficulty walking;Acute pain   PT Problem List Decreased strength;Decreased range of motion;Decreased activity tolerance;Decreased balance;Decreased mobility;Decreased knowledge of use of DME;Decreased safety awareness;Decreased knowledge of precautions;Pain  PT Treatment Interventions DME instruction;Gait training;Stair training;Functional mobility training;Therapeutic activities;Therapeutic exercise;Neuromuscular re-education;Patient/family education   PT Goals (Current goals can be found in the Care Plan section) Acute Rehab PT Goals Patient Stated Goal: To go to rehab PT Goal Formulation: With patient Time For Goal Achievement: 08/04/14 Potential to Achieve Goals: Fair    Frequency Min 2X/week   Barriers to discharge        Co-evaluation               End of Session   Activity Tolerance: Patient limited by pain Patient left: in bed;with call bell/phone within reach Nurse Communication: Mobility status         Time: 6834-1962 PT Time Calculation (min): 19 min   Charges:   PT Evaluation $Initial PT Evaluation Tier I: 1 Procedure PT Treatments $Therapeutic Activity: 8-22 mins   PT G CodesJolyn Lent 07/21/2014, 5:17 PM  Jolyn Lent, PT, DPT Acute Rehabilitation Services Pager: [phone]

## 2014-07-22 DIAGNOSIS — E1142 Type 2 diabetes mellitus with diabetic polyneuropathy: Secondary | ICD-10-CM

## 2014-07-22 DIAGNOSIS — M21969 Unspecified acquired deformity of unspecified lower leg: Secondary | ICD-10-CM

## 2014-07-22 DIAGNOSIS — I509 Heart failure, unspecified: Secondary | ICD-10-CM

## 2014-07-22 DIAGNOSIS — E1149 Type 2 diabetes mellitus with other diabetic neurological complication: Secondary | ICD-10-CM

## 2014-07-22 DIAGNOSIS — M25539 Pain in unspecified wrist: Secondary | ICD-10-CM

## 2014-07-22 DIAGNOSIS — E1169 Type 2 diabetes mellitus with other specified complication: Secondary | ICD-10-CM

## 2014-07-22 DIAGNOSIS — M1990 Unspecified osteoarthritis, unspecified site: Secondary | ICD-10-CM

## 2014-07-22 DIAGNOSIS — G9340 Encephalopathy, unspecified: Secondary | ICD-10-CM

## 2014-07-22 DIAGNOSIS — I5022 Chronic systolic (congestive) heart failure: Secondary | ICD-10-CM

## 2014-07-22 LAB — CBC
HCT: 19.4 % — ABNORMAL LOW (ref 36.0–46.0)
HEMATOCRIT: 21.6 % — AB (ref 36.0–46.0)
HEMOGLOBIN: 6.2 g/dL — AB (ref 12.0–15.0)
Hemoglobin: 6.6 g/dL — CL (ref 12.0–15.0)
MCH: 26.3 pg (ref 26.0–34.0)
MCH: 27.2 pg (ref 26.0–34.0)
MCHC: 30.6 g/dL (ref 30.0–36.0)
MCHC: 32 g/dL (ref 30.0–36.0)
MCV: 86.1 fL (ref 78.0–100.0)
MCV: 85.1 fL (ref 78.0–100.0)
Platelets: 449 10*3/uL — ABNORMAL HIGH (ref 150–400)
Platelets: 426 10*3/uL — ABNORMAL HIGH (ref 150–400)
RBC: 2.51 MIL/uL — AB (ref 3.87–5.11)
RBC: 2.28 MIL/uL — AB (ref 3.87–5.11)
RDW: 17.4 % — ABNORMAL HIGH (ref 11.5–15.5)
RDW: 17.5 % — ABNORMAL HIGH (ref 11.5–15.5)
WBC: 22.7 10*3/uL — ABNORMAL HIGH (ref 4.0–10.5)
WBC: 26.2 10*3/uL — ABNORMAL HIGH (ref 4.0–10.5)

## 2014-07-22 LAB — CBC WITH DIFFERENTIAL/PLATELET
Basophils Absolute: 0 10*3/uL (ref 0.0–0.1)
Basophils Relative: 0 % (ref 0–1)
EOS ABS: 0 10*3/uL (ref 0.0–0.7)
EOS PCT: 0 % (ref 0–5)
HCT: 20.5 % — ABNORMAL LOW (ref 36.0–46.0)
HEMOGLOBIN: 6.5 g/dL — AB (ref 12.0–15.0)
LYMPHS PCT: 14 % (ref 12–46)
Lymphs Abs: 3.7 10*3/uL (ref 0.7–4.0)
MCH: 26.7 pg (ref 26.0–34.0)
MCHC: 31.7 g/dL (ref 30.0–36.0)
MCV: 84.4 fL (ref 78.0–100.0)
Monocytes Absolute: 2.4 10*3/uL — ABNORMAL HIGH (ref 0.1–1.0)
Monocytes Relative: 9 % (ref 3–12)
NEUTROS PCT: 77 % (ref 43–77)
Neutro Abs: 20.4 10*3/uL — ABNORMAL HIGH (ref 1.7–7.7)
Platelets: ADEQUATE 10*3/uL (ref 150–400)
RBC: 2.43 MIL/uL — AB (ref 3.87–5.11)
RDW: 17.3 % — ABNORMAL HIGH (ref 11.5–15.5)
WBC: 26.5 10*3/uL — ABNORMAL HIGH (ref 4.0–10.5)

## 2014-07-22 LAB — COMPREHENSIVE METABOLIC PANEL
ALBUMIN: 2.2 g/dL — AB (ref 3.5–5.2)
ALT: 9 U/L (ref 0–35)
ANION GAP: 15 (ref 5–15)
AST: 15 U/L (ref 0–37)
Alkaline Phosphatase: 129 U/L — ABNORMAL HIGH (ref 39–117)
BILIRUBIN TOTAL: 0.7 mg/dL (ref 0.3–1.2)
BUN: 27 mg/dL — ABNORMAL HIGH (ref 6–23)
CHLORIDE: 95 meq/L — AB (ref 96–112)
CO2: 27 mEq/L (ref 19–32)
CREATININE: 1.23 mg/dL — AB (ref 0.50–1.10)
Calcium: 9.5 mg/dL (ref 8.4–10.5)
GFR calc Af Amer: 52 mL/min — ABNORMAL LOW (ref >90)
GFR calc non Af Amer: 45 mL/min — ABNORMAL LOW (ref >90)
Glucose, Bld: 220 mg/dL — ABNORMAL HIGH (ref 70–99)
Potassium: 5.1 mEq/L (ref 3.7–5.3)
Sodium: 137 mEq/L (ref 137–147)
TOTAL PROTEIN: 8.1 g/dL (ref 6.0–8.3)

## 2014-07-22 LAB — GLUCOSE, CAPILLARY
GLUCOSE-CAPILLARY: 134 mg/dL — AB (ref 70–99)
Glucose-Capillary: 227 mg/dL — ABNORMAL HIGH (ref 70–99)
Glucose-Capillary: 248 mg/dL — ABNORMAL HIGH (ref 70–99)
Glucose-Capillary: 119 mg/dL — ABNORMAL HIGH (ref 70–99)

## 2014-07-22 LAB — URINE CULTURE: Colony Count: >=100000

## 2014-07-22 LAB — ABO/RH: ABO/RH(D): O POS

## 2014-07-22 LAB — PREPARE RBC (CROSSMATCH)

## 2014-07-22 LAB — RETICULOCYTES
RBC.: 2.28 MIL/uL — ABNORMAL LOW (ref 3.87–5.11)
RETIC CT PCT: 0.7 % (ref 0.4–3.1)
Retic Count, Absolute: 16 10*3/uL — ABNORMAL LOW (ref 19.0–186.0)

## 2014-07-22 MED ORDER — PANTOPRAZOLE SODIUM 40 MG PO TBEC
40.0000 mg | DELAYED_RELEASE_TABLET | Freq: Two times a day (BID) | ORAL | Status: DC
  Administered 2014-07-22 – 2014-07-25 (×6): 40 mg via ORAL
  Filled 2014-07-22 (×6): qty 1

## 2014-07-22 MED ORDER — KETOROLAC TROMETHAMINE 30 MG/ML IJ SOLN
15.0000 mg | Freq: Four times a day (QID) | INTRAMUSCULAR | Status: AC
  Administered 2014-07-22 – 2014-07-24 (×6): 15 mg via INTRAVENOUS
  Filled 2014-07-22 (×9): qty 1

## 2014-07-22 MED ORDER — SODIUM CHLORIDE 0.9 % IV SOLN: Freq: Once | INTRAVENOUS | Status: DC

## 2014-07-22 MED ORDER — IOHEXOL 300 MG/ML  SOLN
50.0000 mL | INTRAMUSCULAR | Status: AC
  Administered 2014-07-22 (×2): 50 mL via ORAL

## 2014-07-22 MED ORDER — KETOROLAC TROMETHAMINE 30 MG/ML IJ SOLN
30.0000 mg | Freq: Four times a day (QID) | INTRAMUSCULAR | Status: DC
  Administered 2014-07-22: 30 mg via INTRAVENOUS
  Filled 2014-07-22 (×2): qty 1

## 2014-07-22 NOTE — Progress Notes (Addendum)
Echocardiogram 2D Echocardiogram has been performed.  Joelene Millin 07/22/2014, 11:20 AM

## 2014-07-22 NOTE — Progress Notes (Signed)
Utilization review completed.  

## 2014-07-22 NOTE — Progress Notes (Signed)
Patient asked RN to use the restroom on multiple times and was unable to void. Bladder scan was taken and residual was 265. MD has been notified. Blood admin will continue as ordered. NO further orders at this time.

## 2014-07-22 NOTE — Progress Notes (Signed)
FMTS Attending Daily Note:  Annabell Sabal MD  [phone] pager  Family Practice pager:  [phone] I have seen and examined this patient and have reviewed their chart. I have discussed this patient with the resident. I agree with the resident's findings, assessment and care plan.  Additionally:  See separate admit note  Alveda Reasons, MD

## 2014-07-22 NOTE — Progress Notes (Signed)
OT Cancellation Note  Patient Details Name: BREXLEE HEBERLEIN MRN: [id number] DOB: 1951/02/24   Cancelled Treatment:    Reason Eval/Treat Not Completed: Patient at procedure or test/ unavailable - have attempted x 3 this am, however, pt with other providers.  Will reattempt later as schedule permits, or will return tomorrow.   Darlina Rumpf [place], OTR/L [phone]  07/22/2014, 1:07 PM

## 2014-07-22 NOTE — Progress Notes (Addendum)
Family Medicine Teaching Service Daily Progress Note Intern Pager: [phone]  Patient name: [name] Medical record number: [id number] Date of birth: 20-Feb-1951 Age: 63 y.o. Gender: female  Primary Care Provider: Lind Covert, MD Consultants: None Code Status: Full  Pt Overview and Major Events to Date:  - Pt. Admitted for pyelonephritis, weakness / malaise.   Assessment and Plan:  [name] is a 63 y.o. female presenting with generalized weakness / malaise for about 1 week as well as severe bilateral foot pain and inability to ambulate well at home, found to have UA suggestive of UTI with fever, WBC >24, and anemia worse than previously but no signs of active bleeding. PMH significant for DM type II, HTN, chronic normocytic anemia, systolic and diastolic CHF, asthma, osteoarthritis with chronic pain / muscle spasm; recently admitted for acute encephalopathy felt to be related to overuse of baclofen at home, and recently discharged from acute SNF following that hospitalization.   #Acute pyelonephritis - UA findings suggestive of infection, vague urinary complaints, elevated WBC, and fever  - s/p CTX x1 in the ED, continued, but began spiking temps 101.6 >101.2 this am, Cx drawn during febrile episode.  - Urine culture obtained prior to abx and growing E.Coli - Blood cx added after abx in the ER, however obtained during am febrile episode. NGTD. - will catheterize if pt is unable to void; monitor I&O, daily weights given CHF (see below). Currently - +1.1L, -9Lb total.  - Tylenol for fever / pain, Zofran for nausea, morphine for pain  - Trend WBC 24 >19.2, kidney function improving. Following am CBC / BMP - Ceftriaxone discontinued and broadened to Vanc / Zsyn with dosing per pharmacy. Pt. continues to spike fevers. 103-102.2 overnight.  - Renal ultrasound yesterday normal. Will get CT abdomen with contrast vs MRI this am given renal function.    #Generalized weakness -  uncertain etiology, likely multifactorial (current infection, foot pain, anemia)  - PT / OT ordered for eval, they recommend readmission to SNF / Acute Care on discharge.  - monitor labs, consider transfusion if Hb trends down or precipitously drops (see individual problems below)  - total protein elevated with albumin low, will need to trend.On 8/23 Total protein 7.6, albumin 2.4, gamma gap 5.2., Corrected calcium 10.4. Pending am labs on 8/24. - SPEP pending - FOBT card pending   #Acute kidney injury - Cr 1.53 on admission, with GFR 40 (baseline appears normal as recently as Sept 2014-Feb 2015)  - poor PO intake recently in setting of acute infection combined with ACE use makes prerenal etiology most likely  - trending BMP's and rehydrate with gentle IVF; hold Lasix, ACE for now, Cr pending this am.  - SPEP and trending clacium / proteins as above.   #Foot pain / hx chronic pain - unclear etiology, also likely multifactorial with hx osteoarthritis, ?plantar fasciitis recently diagnosed outpt, increased swelling in feet possibly due to CHF, and elevated uric acid at last hospitalization  - will manage with pain medication acutely (morphine PRN), and continue home baclofen, Neurontin  - PT / OT eval to check for functional status recommending discharge to SNF 2/2 decreased functionality with pain.  - Concern for acute gouty episode. Uric acid level 12.2. Unsure if this is truly acute gout given renal impairment. Also difficult to treat given renal impairment. Will adjust therapy as indicated.  - Physical exam suggestive of improving edema.  - see also CHF below   #CHF - last echo  October 2014, with EF 55-60 and grade 1 diastolic dysfunction (prior in 2013 with EF 16-10, grade 2 diastolic dysfunction), on spironolactone 25 mg daily, benazepril 20 mg daily, metoprolol 25 mg BID, Lasix 80 mg PO daily at home  - not acutely fluid overloaded on lung exam, foot swelling improvement per physical  exam. - continue home meds, for now, though hold ACE and Lasix while in acute kidney injury  - BNP elevated 199.1 this am. Will continue to monitor fluid status. Holding lasix for now given kidney injury.  - +1.1L as above.  - echo pending.   #Normocytic anemia - chronic for at least the past few years, with recent baseline in 8-10 range  - Hb on admission 7.4, though no clear signs of active bleeding  - will check FOBT still pending, iron studies in anemia of chronic disease picture - avoid transfusion if possible; may need to consider IV iron as outpt.  - No po iron given current infection.    #HTN - BP's generally in a well-controlled range; holding ACE / Lasix while in acute kidney injury  - ordered for hydralazine PRN for BP >160 / 90   #DM type II - on metformin and diet control, at home; recent A1c 7.1 (up from 6.1 earlier this year)  - hold metformin while admitted, especially with AKI  - ordered for SSI and CBG's qAC+HS  - Glucose 227 this am. Switched to resistant SSI.   #Asthma - no acute respiratory distress or wheezing; continue home albuterol PRN   #GERD - on Zantac daily at home, sub Pepcid while admitted  FEN/GI: NS @50  mL/h, heart-healthy / carb-modified diet  Prophylaxis: subQ heparin   Disposition: Will continue to monitor her tenuous status given spiking fevers, and concern for pyelonephritis. Renal U/S normal. Will consider other abdominal imaging today.   Subjective:  Pt. States that she continues to have generalized pain with the majority of her pain occurring in her lower back. She denies SOB, wheezing. She says that she still has severe foot pain. She otherwise feels very little improvement since admission. She is eating this am upon my interview, and has been able to take PO  Objective: Temp:  [97.7 F (36.5 C)-103 F (39.4 C)] 97.7 F (36.5 C) (08/24 0514) Pulse Rate:  [80-110] 80 (08/24 0514) Resp:  [18] 18 (08/24 0514) BP: (100-123)/(60-66) 100/66  mmHg (08/24 0514) SpO2:  [94 %-100 %] 99 % (08/24 0514) Weight:  [216 lb (97.977 kg)] 216 lb (97.977 kg) (08/24 0204) Physical Exam: General: NAD, does not desire to sit up 2/2 pain, AAOx3 Cardiovascular: RRR, no MGR Respiratory: CTA Bilaterally given limited pulmonary exam. No audible crackles, or wheezes, clear in upper and lower lung fields bilaterally Abdomen: Soft, nontender, nondistended, +BS Extremities: Hands somewhat puffy, Feet with significant edema (likely 2-3+, but unable to evaluate due to tenderness), dorsal / pedal tenderness, 2+ Posterior Tibial pulses bilaterallly. Overall limited exam. Wrinkling of dorsal pedal skin noted for improvement of previously worse lower extremity edema, so reassuring overall.  Laboratory:  Recent Labs Lab 07/18/14 1030 07/20/14 1529 07/21/14 0520  WBC 21.5* 24.3* 19.2*  HGB 7.9* 7.4* 7.1*  HCT 24.7* 23.5* 24.4*  PLT 338 413* PLATELET CLUMPS NOTED ON SMEAR, COUNT APPEARS ADEQUATE    Recent Labs Lab 07/18/14 1030 07/20/14 1342 07/21/14 0520  NA 136* 131* 134*  K 4.5 4.9 4.9  CL 94* 88* 93*  CO2 28 24 27   BUN 31* 35* 28*  CREATININE 1.41*  1.53* 1.25*  CALCIUM 9.8 10.0 9.1  PROT 8.3 9.1* 7.6  BILITOT 0.9 1.0 0.8  ALKPHOS 85 120* 108  ALT 6 8 7   AST 9 16 11   GLUCOSE 228* 247* 243*    Pro BNP -199.1 Glucose - 218  Uric Acid - 12.2 SPEP - pending  Blood CX - pending Urine CX - E.Coli  Aquilla Hacker, MD 07/22/2014, 7:25 AM PGY-1 [facility] Intern pager: [phone], text pages welcome ++++++++++++++++++++++++++++++++++++++++++++++++++++++++++++++++++++++++++++++++++++++++++++++++++++++++++++++++++++++++++  I have seen and examined this patient. I have discussed with Dr Minda Ditto.  I agree with their findings and plans as documented in their progress note.  1. Working Principle Diagnosis is AcuteE. Coli  Pyelonephritis  2. Multiple Locations Pain, acute worsening on chronic pains A. Chronic lumbar  back pain without radiculopathy    - Worsened in last week    - CT lung and abdomen and pelvis 06/02/14 showed degenerative changes throughout the thoracolumbar spine without destructive lesions B. General Right Arm pain, particularly right elbow and wrist.      - Acute worsening in last week.     - Xray right hand (01/27/14) with widening of scapholunate space             suggesting ligamentous injury. C.  Right elbow pain  - Xray 2 view right elbow 06/04/14: degenerative changes of                trochlea and capitellum. D. Bilateral feet pain, chronic      - Previous diagnosis of diabetic neuropathy on gabapentin.      - Suspect patient's current allodynia with light touch is secondary to      diabetic neuropathy.   3.  Hyperuricemia - secondary to patient's metabolic syndrome  - There could potentially be a component of acute gouty polyarthropathy contributing to patient's multiple painful joint complaints.  4. Normocytic Anemia - Hgb in 11 g/dL range in 2014 - Hgb has decline to 6.6 g/dL.  Uncertain origin of decline since admission - Elevated FTN and Iron panel pattern c/w ACD - SPEP pending.   Recommend Continue BS antibiotics IV, narrow coverage based on sensitivities E. Coli. Will hold on CT chest, abdomen/pelvis given recent CT in early July and known preexisting conditions explaining patient's multiple sites of pain. Low dose of Toradol to see if there is a gouty component to pain. Transfuse PRBC to Hgb > 7.0 g/dL.  Has patient had recent GI endocopic evaluations. Stop Heparin and start SCDs.  Continue gabapentin.  May increase dose.

## 2014-07-22 NOTE — Evaluation (Signed)
Occupational Therapy Evaluation Patient Details Name: [name] MRN: [id number] DOB: 1951-02-17 Today's Date: 07/22/2014    History of Present Illness Pt is a 63 y.o. female presenting with generalized weakness / malaise for about 1 week as well as severe bilateral foot pain and inability to ambulate well at home, found to have UA suggestive of UTI with fever, WBC >24, and anemia worse than previously but no signs of active bleeding. PMH significant for DM type II, HTN, chronic normocytic anemia, systolic and diastolic CHF, asthma, osteoarthritis with chronic pain / muscle spasm; recently admitted for acute encephalopathy felt to be related to overuse of baclofen at home, and recently discharged from acute SNF following that hospitalization.   Clinical Impression   Pt admitted with above. She demonstrates the below listed deficits and will benefit from continued OT to maximize safety and independence with BADLs.  Pt with complaint of pain bil. UEs and bil LEs which limit her function.  When asked to move, she states she can't, but with encouragement, coaxing, and assist, she was able to move to EOB with mod A.   She is total Assist with all BADLs at this time.  Will need SNF.       Follow Up Recommendations  SNF    Equipment Recommendations  None recommended by OT    Recommendations for Other Services       Precautions / Restrictions Precautions Precautions: Fall      Mobility Bed Mobility Overal bed mobility: Needs Assistance Bed Mobility: Supine to Sit;Sit to Supine     Supine to sit: Mod assist Sit to supine: Mod assist   General bed mobility comments: Pt with complaint of pain with all movments.  She moves very slowly.  Requires max encouragement to move.  Requires increased time for all aspects  Transfers                      Balance Overall balance assessment: Needs assistance Sitting-balance support: Bilateral upper extremity supported Sitting  balance-Leahy Scale: Poor                                      ADL Overall ADL's : Needs assistance/impaired Eating/Feeding: NPO   Grooming: Wash/dry hands;Wash/dry face;Oral care;Brushing hair;Maximal assistance;Bed level   Upper Body Bathing: Total assistance;Sitting   Lower Body Bathing: Total assistance;Bed level   Upper Body Dressing : Total assistance;Sitting   Lower Body Dressing: Total assistance;Bed level   Toilet Transfer: Total assistance (unable )   Toileting- Clothing Manipulation and Hygiene: Total assistance;Bed level       Functional mobility during ADLs: Moderate assistance (bed mobility) General ADL Comments: Pt only would assist wtih moving to EOB.  States she can't move, but will assist when encouraged.  She reports pain with all movement      Vision                     Perception     Praxis      Pertinent Vitals/Pain Pain Assessment: Faces Faces Pain Scale: Hurts worst Pain Location: Pt reports pain bil. LEs bil. UEs.  She reports it is constants and aching  Pain Descriptors / Indicators: Aching Pain Intervention(s): Monitored during session;Repositioned     Hand Dominance Right   Extremity/Trunk Assessment Upper Extremity Assessment Upper Extremity Assessment: RUE deficits/detail;LUE deficits/detail RUE Deficits / Details: Pt reports her  arm was fine until they took blood from it this am, and now she can't move it due to severe pain.  She performed AAROM shoulder WFL; demonstrates grip 3/5; she would not attempt elbow flexion  RUE: Unable to fully assess due to pain RUE Coordination: decreased fine motor;decreased gross motor LUE Deficits / Details: Pt demonstrates AROM 3/5.  Pain with all movements, but does use functionally  LUE: Unable to fully assess due to pain   Lower Extremity Assessment Lower Extremity Assessment: Generalized weakness   Cervical / Trunk Assessment Cervical / Trunk Assessment: Normal    Communication Communication Communication: No difficulties   Cognition Arousal/Alertness: Awake/alert Behavior During Therapy: WFL for tasks assessed/performed Overall Cognitive Status: Within Functional Limits for tasks assessed                     General Comments       Exercises       Shoulder Instructions      Home Living Family/patient expects to be discharged to:: Skilled nursing facility Living Arrangements: Alone                               Additional Comments: Pt uses ramped entrance and has granddaughter who just graduated from [employer] who is with her and available per patient "sometimes" during this summer      Prior Functioning/Environment Level of Independence: Independent with assistive device(s);Needs assistance    ADL's / Homemaking Assistance Needed: had an aid 2 days a week who assisted with showers.  Son grocery shops for her   Comments: Used walker and cane occasionally. Does not drive, was doing her own grocery shopping.     OT Diagnosis: Generalized weakness;Acute pain   OT Problem List: Decreased strength;Decreased activity tolerance;Decreased range of motion;Impaired balance (sitting and/or standing);Decreased coordination;Decreased knowledge of use of DME or AE;Pain;Impaired UE functional use   OT Treatment/Interventions: Self-care/ADL training;Therapeutic exercise;DME and/or AE instruction;Therapeutic activities;Patient/family education;Balance training    OT Goals(Current goals can be found in the care plan section) Acute Rehab OT Goals Patient Stated Goal: to get better OT Goal Formulation: With patient Time For Goal Achievement: 08/05/14 Potential to Achieve Goals: Good ADL Goals Pt Will Perform Eating: with min assist;with adaptive utensils Pt Will Perform Grooming: with min assist;sitting Pt Will Perform Upper Body Bathing: with min assist;sitting Additional ADL Goal #1: Pt will move to EOB sitting with supervision in  prep for BADLs  OT Frequency: Min 2X/week   Barriers to D/C: Decreased caregiver support          Co-evaluation              End of Session Nurse Communication: Mobility status  Activity Tolerance: Patient limited by pain Patient left: in bed;with call bell/phone within reach   Time: 1537-1601 OT Time Calculation (min): 24 min Charges:  OT General Charges $OT Visit: 1 Procedure OT Evaluation $Initial OT Evaluation Tier I: 1 Procedure OT Treatments $Therapeutic Activity: 8-22 mins G-Codes:    Kalieb Freeland M 2014/07/26, 4:21 PM

## 2014-07-22 NOTE — Progress Notes (Addendum)
Inpatient Diabetes Program Recommendations  AACE/ADA: New Consensus Statement on Inpatient Glycemic Control (2013)  Target Ranges:  Prepandial:   less than 140 mg/dL      Peak postprandial:   less than 180 mg/dL (1-2 hours)      Critically ill patients:  140 - 180 mg/dL     Results for [name], [name] (MRN [id number]) as of 07/22/2014 08:09  Ref. Range 07/21/2014 07:43 07/21/2014 12:24 07/21/2014 17:11 07/21/2014 21:11  Glucose-Capillary Latest Range: 70-99 mg/dL 251 (H) 218 (H) 223 (H) 152 (H)    Results for [name], [name] (MRN [id number]) as of 07/22/2014 08:09  Ref. Range 07/22/2014 07:41  Glucose-Capillary Latest Range: 70-99 mg/dL 227 (H)     Patient having elevated fasting glucose levels.  Note that Novolog SSI increased to Resistant scale last PM and Novolog 6 units tid meal coverage started as well.  Fasting glucose still elevated today.  PO intake only 25% of meals.    MD- Please consider adding basal insulin- Lantus 10 units QHS (0.1 units/kg dosing based on pt weight of 97kg) to help better control CBGs in hospital  May want to reduce Novolog Meal Coverage to 4 units tidwc since PO intake poor    Will follow Wyn Quaker RN, MSN, CDE Diabetes Coordinator Inpatient Diabetes Program Team Pager: [phone] (8a-10p)

## 2014-07-23 LAB — GLUCOSE, CAPILLARY
GLUCOSE-CAPILLARY: 150 mg/dL — AB (ref 70–99)
GLUCOSE-CAPILLARY: 149 mg/dL — AB (ref 70–99)
Glucose-Capillary: 151 mg/dL — ABNORMAL HIGH (ref 70–99)
Glucose-Capillary: 148 mg/dL — ABNORMAL HIGH (ref 70–99)

## 2014-07-23 LAB — PROTEIN ELECTROPHORESIS, SERUM
ALBUMIN ELP: 37.1 % — AB (ref 55.8–66.1)
ALPHA-1-GLOBULIN: 11.4 % — AB (ref 2.9–4.9)
Alpha-2-Globulin: 19.8 % — ABNORMAL HIGH (ref 7.1–11.8)
BETA 2: 8.8 % — AB (ref 3.2–6.5)
Beta Globulin: 5 % (ref 4.7–7.2)
Gamma Globulin: 17.9 % (ref 11.1–18.8)
M-Spike, %: NOT DETECTED g/dL
Total Protein ELP: 7.5 g/dL (ref 6.0–8.3)

## 2014-07-23 LAB — CLOSTRIDIUM DIFFICILE BY PCR: Toxigenic C. Difficile by PCR: NEGATIVE

## 2014-07-23 LAB — COMPREHENSIVE METABOLIC PANEL
ALT: 11 U/L (ref 0–35)
ANION GAP: 16 — AB (ref 5–15)
AST: 21 U/L (ref 0–37)
Albumin: 2 g/dL — ABNORMAL LOW (ref 3.5–5.2)
Alkaline Phosphatase: 157 U/L — ABNORMAL HIGH (ref 39–117)
BUN: 29 mg/dL — ABNORMAL HIGH (ref 6–23)
CALCIUM: 9 mg/dL (ref 8.4–10.5)
CO2: 23 meq/L (ref 19–32)
Chloride: 96 mEq/L (ref 96–112)
Creatinine, Ser: 1.31 mg/dL — ABNORMAL HIGH (ref 0.50–1.10)
GFR, EST AFRICAN AMERICAN: 48 mL/min — AB (ref >90)
GFR, EST NON AFRICAN AMERICAN: 42 mL/min — AB (ref >90)
GLUCOSE: 133 mg/dL — AB (ref 70–99)
Potassium: 4.6 mEq/L (ref 3.7–5.3)
SODIUM: 135 meq/L — AB (ref 137–147)
Total Bilirubin: 1.3 mg/dL — ABNORMAL HIGH (ref 0.3–1.2)
Total Protein: 7.4 g/dL (ref 6.0–8.3)

## 2014-07-23 LAB — CBC WITH DIFFERENTIAL/PLATELET
Basophils Absolute: 0.1 10*3/uL (ref 0.0–0.1)
Basophils Relative: 0 % (ref 0–1)
Eosinophils Absolute: 0.3 10*3/uL (ref 0.0–0.7)
Eosinophils Relative: 1 % (ref 0–5)
HCT: 28.5 % — ABNORMAL LOW (ref 36.0–46.0)
Hemoglobin: 9.2 g/dL — ABNORMAL LOW (ref 12.0–15.0)
LYMPHS PCT: 14 % (ref 12–46)
Lymphs Abs: 3.5 10*3/uL (ref 0.7–4.0)
MCH: 28.1 pg (ref 26.0–34.0)
MCHC: 32.3 g/dL (ref 30.0–36.0)
MCV: 87.2 fL (ref 78.0–100.0)
MONO ABS: 1.9 10*3/uL — AB (ref 0.1–1.0)
Monocytes Relative: 8 % (ref 3–12)
Neutro Abs: 19 10*3/uL — ABNORMAL HIGH (ref 1.7–7.7)
Neutrophils Relative %: 77 % (ref 43–77)
Platelets: 432 10*3/uL — ABNORMAL HIGH (ref 150–400)
RBC: 3.27 MIL/uL — ABNORMAL LOW (ref 3.87–5.11)
RDW: 16.5 % — ABNORMAL HIGH (ref 11.5–15.5)
WBC: 24.8 10*3/uL — ABNORMAL HIGH (ref 4.0–10.5)

## 2014-07-23 LAB — GI PATHOGEN PANEL BY PCR, STOOL
C difficile toxin A/B: NEGATIVE
Campylobacter by PCR: NEGATIVE
Cryptosporidium by PCR: NEGATIVE
E COLI (STEC): NEGATIVE
E coli (ETEC) LT/ST: NEGATIVE
E coli 0157 by PCR: NEGATIVE
G lamblia by PCR: NEGATIVE
NOROVIRUS G1/G2: NEGATIVE
ROTAVIRUS A BY PCR: NEGATIVE
Salmonella by PCR: NEGATIVE
Shigella by PCR: NEGATIVE

## 2014-07-23 LAB — TSH: TSH: 1.93 u[IU]/mL (ref 0.350–4.500)

## 2014-07-23 LAB — OCCULT BLOOD X 1 CARD TO LAB, STOOL: Fecal Occult Bld: NEGATIVE

## 2014-07-23 MED ORDER — INSULIN ASPART 100 UNIT/ML ~~LOC~~ SOLN
4.0000 [IU] | Freq: Three times a day (TID) | SUBCUTANEOUS | Status: DC
  Administered 2014-07-23 – 2014-07-25 (×7): 4 [IU] via SUBCUTANEOUS

## 2014-07-23 MED ORDER — SULFAMETHOXAZOLE-TMP DS 800-160 MG PO TABS
1.0000 | ORAL_TABLET | Freq: Two times a day (BID) | ORAL | Status: DC
  Administered 2014-07-23 – 2014-07-25 (×4): 1 via ORAL
  Filled 2014-07-23 (×5): qty 1

## 2014-07-23 MED ORDER — LIDOCAINE 5 % EX PTCH
2.0000 | MEDICATED_PATCH | CUTANEOUS | Status: DC
  Administered 2014-07-23 – 2014-07-24 (×2): 2 via TRANSDERMAL
  Filled 2014-07-23 (×4): qty 2

## 2014-07-23 MED ORDER — GABAPENTIN 400 MG PO CAPS
700.0000 mg | ORAL_CAPSULE | Freq: Two times a day (BID) | ORAL | Status: DC
  Administered 2014-07-23 – 2014-07-25 (×4): 700 mg via ORAL
  Filled 2014-07-23 (×5): qty 1

## 2014-07-23 MED ORDER — INSULIN ASPART 100 UNIT/ML ~~LOC~~ SOLN
0.0000 [IU] | Freq: Three times a day (TID) | SUBCUTANEOUS | Status: DC
  Administered 2014-07-23: 3 [IU] via SUBCUTANEOUS
  Administered 2014-07-23: 4 [IU] via SUBCUTANEOUS
  Administered 2014-07-23: 3 [IU] via SUBCUTANEOUS
  Administered 2014-07-24: 7 [IU] via SUBCUTANEOUS
  Administered 2014-07-24: 3 [IU] via SUBCUTANEOUS
  Administered 2014-07-24 – 2014-07-25 (×2): 4 [IU] via SUBCUTANEOUS

## 2014-07-23 MED ORDER — INSULIN GLARGINE 100 UNIT/ML ~~LOC~~ SOLN
10.0000 [IU] | Freq: Every day | SUBCUTANEOUS | Status: DC
  Administered 2014-07-23 – 2014-07-24 (×2): 10 [IU] via SUBCUTANEOUS
  Filled 2014-07-23 (×3): qty 0.1

## 2014-07-23 NOTE — Progress Notes (Signed)
I have seen and examined this patient. I have discussed with Dr Minda Ditto.  I agree with their findings and plans as documented in their progress note.

## 2014-07-23 NOTE — Progress Notes (Signed)
Physical Therapy Treatment Patient Details Name: [name] MRN: [id number] DOB: [birth year] Today's Date: 07/23/2014    History of Present Illness Pt is a 63 y.o. female presenting with generalized weakness / malaise for about 1 week as well as severe bilateral foot pain and inability to ambulate well at home, found to have UA suggestive of UTI with fever, WBC >24, and anemia worse than previously but no signs of active bleeding. PMH significant for DM type II, HTN, chronic normocytic anemia, systolic and diastolic CHF, asthma, osteoarthritis with chronic pain / muscle spasm; recently admitted for acute encephalopathy felt to be related to overuse of baclofen at home, and recently discharged from acute SNF following that hospitalization.    PT Comments    Patient continues to complain of pain in all extremities worsened with light touch, weight bearing or any mobility. Pt became tearful during session due to pain. Able to stand today for short time with assist x2 however became anxious, intolerable to pain in BLEs and concerned about falling resulting in need to sit. Requires encouragement to participate in therapy. Will need to coordinate pain medications prior to treatment so pt can tolerate therapy.     Follow Up Recommendations  SNF;Supervision/Assistance - 24 hour     Equipment Recommendations  Rolling walker with 5" wheels;3in1 (PT)    Recommendations for Other Services       Precautions / Restrictions Precautions Precautions: Fall Restrictions Weight Bearing Restrictions: No    Mobility  Bed Mobility Overal bed mobility: Needs Assistance Bed Mobility: Rolling;Sidelying to Sit;Sit to Sidelying Rolling: Min guard Sidelying to sit: Mod assist;HOB elevated   Sit to supine: HOB elevated;Mod assist   General bed mobility comments: Rolling to right/left x2. Sidelying to sit x2 to right side of bed and left side of bed.  Increased time due to pain. Required encouragement to  move. Increased pain in all extremities during bed mobility.  Transfers Overall transfer level: Needs assistance Equipment used: Rolling walker (2 wheeled) Transfers: Sit to/from Stand Sit to Stand: Mod assist;+2 physical assistance         General transfer comment: Pt unable to use left hand for support on RW due to pain. VC for anterior translation, use of body momentum and cues for hip extension upon standing. Able to stand on second attempt. Requires encouragement.  Ambulation/Gait             General Gait Details: Unable   Stairs            Wheelchair Mobility    Modified Rankin (Stroke Patients Only)       Balance Overall balance assessment: Needs assistance Sitting-balance support: Single extremity supported Sitting balance-Leahy Scale: Fair Sitting balance - Comments: Able to sit EOB ~10 minutes unsupported.     Standing balance-Leahy Scale: Poor Standing balance comment: Able to stand <30 seconds with LUE supported on RW and therapist stabilizing hips/trunk for balance. Standing tolerance limited due to pain resulting in need to return to sitting. "I am going to fall. I am going to fall."                    Cognition Arousal/Alertness: Awake/alert Behavior During Therapy: WFL for tasks assessed/performed Overall Cognitive Status: Within Functional Limits for tasks assessed                      Exercises General Exercises - Lower Extremity Long Arc Quad: Both;5 reps;Seated Other Exercises Other Exercises: Reciprocal  scooting EOB x5 with Min A at times.    General Comments        Pertinent Vitals/Pain Pain Assessment: 0-10 Pain Score:  (Not rated on pain scale.) Pain Location: Pt reports pain in BLEs, RUE>LUE. Reports it as sharp to the touch. Pain Descriptors / Indicators: Sharp;Sore;Grimacing;Guarding;Tender Pain Intervention(s): Limited activity within patient's tolerance;Monitored during session;Repositioned;Patient  requesting pain meds-RN notified    Home Living Family/patient expects to be discharged to:: Skilled nursing facility                    Prior Function            PT Goals (current goals can now be found in the care plan section) Progress towards PT goals: Progressing toward goals    Frequency  Min 2X/week    PT Plan Current plan remains appropriate    Co-evaluation             End of Session Equipment Utilized During Treatment: Gait belt Activity Tolerance: Patient limited by pain Patient left: in bed;with call bell/phone within reach;with bed alarm set;with nursing/sitter in room (sitting EOB.)     Time: 3818-2993 PT Time Calculation (min): 23 min  Charges:  $Therapeutic Activity: 23-37 mins                    G CodesCandy Sledge A 2014-08-19, 2:52 PM Candy Sledge, [place], DPT [phone]

## 2014-07-23 NOTE — Progress Notes (Signed)
Patient tried to void again and was unable to. RN bladder scanned patient and found residual to be 462. MD has been contacted for further orders. Possible in and out cath. Will continue to monitor

## 2014-07-23 NOTE — Care Management Note (Signed)
    Page 1 of 1   07/25/2014     3:01:58 PM CARE MANAGEMENT NOTE 07/25/2014  Patient:  [name], [name]   Account Number:  [id number]  Date Initiated:  07/23/2014  Documentation initiated by:  Tomi Bamberger  Subjective/Objective Assessment:   dx uti  admit- lives alone,  Patient was at [facility] recently.     Action/Plan:   pt eval- rec snf.   Anticipated DC Date:  07/25/2014   Anticipated DC Plan:  SKILLED NURSING FACILITY  In-house referral  Clinical Social Worker      DC Planning Services  CM consult      Choice offered to / List presented to:             Status of service:  Completed, signed off Medicare Important Message given?  YES (If response is "NO", the following Medicare IM given date fields will be blank) Date Medicare IM given:  07/23/2014 Medicare IM given by:  Tomi Bamberger Date Additional Medicare IM given:   Additional Medicare IM given by:    Discharge Disposition:  [place]  Per UR Regulation:  Reviewed for med. necessity/level of care/duration of stay  If discussed at [place] of Stay Meetings, dates discussed:    Comments:  07/25/14 [place], BSN [phone] patient is for dc to snf today.  8//25/15 [place] ,BSN [phone] patient lives alone, patient has recently been to [facility], and she would like to go back there per CSW.  Patient is total ast per physical therpay.

## 2014-07-23 NOTE — Progress Notes (Signed)
Family Medicine Teaching Service Daily Progress Note Intern Pager: [phone]  Patient name: [name] Medical record number: [id number] Date of birth: 1950/12/11 Age: 63 y.o. Gender: female  Primary Care Provider: Lind Covert, MD Consultants: None Code Status: Full  Pt Overview and Major Events to Date:  - Pt. Admitted for pyelonephritis, weakness / malaise.   Assessment and Plan:  [name] is a 63 y.o. female presenting with generalized weakness / malaise for about 1 week as well as severe bilateral foot pain and inability to ambulate well at home, found to have UA suggestive of UTI with fever, WBC >24, and anemia that has been worsening during this hospitalization. PMH significant for DM type II, HTN, chronic normocytic anemia, systolic and diastolic CHF, asthma, osteoarthritis with chronic pain / muscle spasm; recently admitted for acute encephalopathy felt to be related to overuse of baclofen at home, and recently discharged from acute SNF following that hospitalization.   #Acute pyelonephritis - UA findings suggestive of infection, vague urinary complaints, elevated WBC, and fever  - s/p CTX x1 in the ED, continued, but began spiking temps 101.6 >101.2 >103 >102 during the first two days of her treatment, Cx drawn after antibiotics, but during febrile episode.  - Urine culture obtained prior to abx and growing E.Coli pansensitive.  - Blood cx added after abx in the ER, however obtained during am febrile episode. NGTD. - Pt. Catheterized over night due to inability to void; monitor I&O, daily weights given CHF (see below). Currently - +1.25L, -9Lb total.  - Tylenol for fever / pain, Zofran for nausea, morphine for pain  - Trend WBC 24 >26, kidney function mildly improving. Following am CBC / BMP - Ceftriaxone discontinued and broadened to Vanc / Zsyn with dosing per pharmacy. Pt. Has been afebrile x 24hours now. Will continue current therapy with eventual transition to PO  antibiotics.  - Renal ultrasound yesterday normal.   #Generalized weakness - uncertain etiology, likely multifactorial (current infection, foot pain, anemia)  - PT / OT ordered for eval, they recommend readmission to SNF / Acute Care on discharge.  - Anemia requiring transfusion x 2 units yesterday (see individual problems below)  - total protein elevated with albumin low, will need to trend.On 8/23 Total protein 7.6, albumin 2.4, gamma gap 5.9., Corrected calcium 10.94. Pending am labs on 8/25. - SPEP pending - FOBT card pending   #Acute kidney injury - Cr 1.53 on admission, with GFR 40 (baseline appears normal as recently as Sept 2014-Feb 2015)  - poor PO intake recently in setting of acute infection combined with ACE use makes prerenal etiology most likely  - trending BMP's and rehydrate with gentle IVF; hold Lasix, ACE for now, Cr pending this am.  - SPEP and trending clacium / proteins as above.   #Foot pain / hx chronic pain - unclear etiology, also likely multifactorial with hx osteoarthritis, ?plantar fasciitis recently diagnosed outpt, increased swelling in feet possibly due to CHF, and elevated uric acid at last hospitalization  - will manage with pain medication acutely (morphine PRN), and continue home baclofen, Neurontin  - PT / OT eval to check for functional status recommending discharge to SNF 2/2 decreased functionality with pain.  - Recent Imaging in July with evidence of global bony degenerative changes.  - Concern for acute gouty episode. Uric acid level 12.2. Unsure if this is truly acute gout given renal impairment. Also difficult to treat given renal impairment. Will adjust therapy as indicated. Continue current  regiment for now.  - Physical exam suggestive of improving edema.  - see also CHF below   #CHF - last echo October 2014, with EF 55-60 and grade 1 diastolic dysfunction (prior in 2013 with EF 78-29, grade 2 diastolic dysfunction), on spironolactone 25 mg daily,  benazepril 20 mg daily, metoprolol 25 mg BID, Lasix 80 mg PO daily at home  - not acutely fluid overloaded on lung exam, foot swelling improvement per physical exam. - continue home meds, for now, though hold ACE and Lasix while in acute kidney injury  - BNP elevated 199.1 this am. Will continue to monitor fluid status. Holding lasix for now given kidney injury.  - +1.25L as above. Will continue to monitor. CHF stable at this time.  - echo with EF 55=-60%, Trivial mitral / tricuspid regurg. Essentially normal.   #Normocytic anemia - chronic for at least the past few years, with recent baseline in 8-10 range  - Hb on admission 7.4, though no clear signs of active bleeding  - will check FOBT- still pending, iron studies in anemia of chronic disease picture, though Ferritin elevated with likely cause being acute infection.  - May need to consider IV iron as outpt.  - Hgb dropped to 6.2. Transfused PRBC's x 2 units. Will continue to trend CBC   #HTN - BP's generally in a well-controlled range; holding ACE / Lasix while in acute kidney injury  - ordered for hydralazine PRN for BP >160 / 90. Not requiring at this time.   #DM type II - on metformin and diet control, at home; recent A1c 7.1 (up from 6.1 earlier this year)  - hold metformin while admitted, especially with AKI  - ordered for SSI and CBG's qAC+HS  - Glucose 227 this am. Switched to resistant SSI, with Novolog 4u at mealtimes, and Lantus 10u QHS per DM Nutrition recs.   # Watery Diarrhea - No abdominal pain, WBC elevated, but due to current infection.  - C.Diff collected and pending.   #Asthma - no acute respiratory distress or wheezing; continue home albuterol PRN   #GERD - on Zantac daily at home, sub Pepcid while admitted  FEN/GI: NS @50  mL/h, heart-healthy / carb-modified diet  Prophylaxis: subQ heparin   Disposition: Continuing to monitor her status. Continue IV abx. Trend CBC's.   Subjective:  Pt. Overall with some  improvement today. She says that she feels much better since the transfusion. She still complains of generalized pain however, but denies SOB, wheezing, or Nausea, vomiting, fever, or chills. Pt. With 3 episodes of watery diarrhea.   Objective: Temp:  [98.4 F (36.9 C)-99.5 F (37.5 C)] 98.4 F (36.9 C) (08/25 0644) Pulse Rate:  [74-88] 75 (08/25 0644) Resp:  [17-20] 20 (08/25 0644) BP: (94-121)/(58-74) 121/72 mmHg (08/25 0644) SpO2:  [95 %-98 %] 97 % (08/25 0644) Weight:  [216 lb (97.977 kg)] 216 lb (97.977 kg) (08/25 0202) Physical Exam: General: NAD, does not desire to sit up 2/2 pain, AAOx3 Cardiovascular: RRR, no MGR Respiratory: CTA Bilaterally given limited pulmonary exam. No audible crackles, or wheezes, clear in upper and lower lung fields bilaterally Abdomen: Soft, nontender, nondistended, +BS Extremities: Hands somewhat puffy, Feet with significant edema  2+,3+, dorsal / pedal tenderness, 2+ Posterior Tibial pulses bilaterallly. Wrinkling of dorsal pedal skin noted for improvement of previously worse lower extremity edema, so reassuring overall.  Laboratory:  Recent Labs Lab 07/22/14 0916 07/22/14 1300 07/22/14 1857  WBC 26.5* 22.7* 26.2*  HGB 6.5* 6.6* 6.2*  HCT 20.5* 21.6* 19.4*  PLT PLATELET CLUMPS NOTED ON SMEAR, COUNT APPEARS ADEQUATE 449* 426*    Recent Labs Lab 07/20/14 1342 07/21/14 0520 07/22/14 1315  NA 131* 134* 137  K 4.9 4.9 5.1  CL 88* 93* 95*  CO2 24 27 27   BUN 35* 28* 27*  CREATININE 1.53* 1.25* 1.23*  CALCIUM 10.0 9.1 9.5  PROT 9.1* 7.6 8.1  BILITOT 1.0 0.8 0.7  ALKPHOS 120* 108 129*  ALT 8 7 9   AST 16 11 15   GLUCOSE 247* 243* 220*    Pro BNP -199.1 Glucose - 218  Uric Acid - 12.2 SPEP - pending  Blood CX - pending Urine CX - E.Coli  Aquilla Hacker, MD 07/23/2014, 6:55 AM PGY-1 [place] Intern pager: [phone], text pages welcome

## 2014-07-23 NOTE — Progress Notes (Signed)
Foley placed. No trauma. Urine is draining. Will continue to monitor

## 2014-07-24 DIAGNOSIS — N390 Urinary tract infection, site not specified: Secondary | ICD-10-CM

## 2014-07-24 LAB — TYPE AND SCREEN
ABO/RH(D): O POS
ANTIBODY SCREEN: NEGATIVE
UNIT DIVISION: 0
Unit division: 0

## 2014-07-24 LAB — COMPREHENSIVE METABOLIC PANEL
ALT: 13 U/L (ref 0–35)
ANION GAP: 14 (ref 5–15)
AST: 19 U/L (ref 0–37)
Albumin: 2 g/dL — ABNORMAL LOW (ref 3.5–5.2)
Alkaline Phosphatase: 164 U/L — ABNORMAL HIGH (ref 39–117)
BUN: 28 mg/dL — AB (ref 6–23)
CALCIUM: 9 mg/dL (ref 8.4–10.5)
CHLORIDE: 100 meq/L (ref 96–112)
CO2: 25 mEq/L (ref 19–32)
Creatinine, Ser: 1.2 mg/dL — ABNORMAL HIGH (ref 0.50–1.10)
GFR calc non Af Amer: 46 mL/min — ABNORMAL LOW (ref >90)
GFR, EST AFRICAN AMERICAN: 54 mL/min — AB (ref >90)
GLUCOSE: 174 mg/dL — AB (ref 70–99)
Potassium: 4.2 mEq/L (ref 3.7–5.3)
Sodium: 139 mEq/L (ref 137–147)
Total Bilirubin: 0.7 mg/dL (ref 0.3–1.2)
Total Protein: 7.1 g/dL (ref 6.0–8.3)

## 2014-07-24 LAB — CBC WITH DIFFERENTIAL/PLATELET
BASOS ABS: 0.1 10*3/uL (ref 0.0–0.1)
Basophils Relative: 0 % (ref 0–1)
EOS PCT: 2 % (ref 0–5)
Eosinophils Absolute: 0.4 10*3/uL (ref 0.0–0.7)
HEMATOCRIT: 24.6 % — AB (ref 36.0–46.0)
HEMOGLOBIN: 8 g/dL — AB (ref 12.0–15.0)
LYMPHS ABS: 2.6 10*3/uL (ref 0.7–4.0)
LYMPHS PCT: 13 % (ref 12–46)
MCH: 27.5 pg (ref 26.0–34.0)
MCHC: 32.5 g/dL (ref 30.0–36.0)
MCV: 84.5 fL (ref 78.0–100.0)
MONO ABS: 1.6 10*3/uL — AB (ref 0.1–1.0)
MONOS PCT: 8 % (ref 3–12)
NEUTROS ABS: 15 10*3/uL — AB (ref 1.7–7.7)
Neutrophils Relative %: 77 % (ref 43–77)
Platelets: 464 10*3/uL — ABNORMAL HIGH (ref 150–400)
RBC: 2.91 MIL/uL — ABNORMAL LOW (ref 3.87–5.11)
RDW: 16.6 % — AB (ref 11.5–15.5)
WBC: 19.6 10*3/uL — AB (ref 4.0–10.5)

## 2014-07-24 LAB — GLUCOSE, CAPILLARY
GLUCOSE-CAPILLARY: 125 mg/dL — AB (ref 70–99)
Glucose-Capillary: 158 mg/dL — ABNORMAL HIGH (ref 70–99)
Glucose-Capillary: 205 mg/dL — ABNORMAL HIGH (ref 70–99)
Glucose-Capillary: 144 mg/dL — ABNORMAL HIGH (ref 70–99)

## 2014-07-24 NOTE — Progress Notes (Signed)
Seen and examined.  Agree with documentation and management by Dr. Minda Ditto.  OK to DC to SNF today if bed available.

## 2014-07-24 NOTE — Clinical Social Work Psychosocial (Signed)
Clinical Social Work Department BRIEF PSYCHOSOCIAL ASSESSMENT 07/24/2014  Patient:  [name], [name]     Account Number:  [id number]     Admit date:  07/20/2014  Clinical Social Worker:  Daiva Huge  Date/Time:  07/24/2014 10:24 AM  Referred by:  Physician  Date Referred:  07/23/2014 Referred for  SNF Placement   Other Referral:   Interview type:  Other - See comment Other interview type:   Met with patient and her [name] at bedside    PSYCHOSOCIAL DATA Living Status:  ALONE Admitted from facility:   Level of care:   Primary support name:  son- [name] and [name] Primary support relationship to patient:  FAMILY Degree of support available:   good    CURRENT CONCERNS Current Concerns  Post-Acute Placement   Other Concerns:    SOCIAL WORK ASSESSMENT / PLAN CSW met with patient and her daughter at bedside- they report that patient was at [facility] for about 3 weeks and was home for about 1 week prior to this readmit-  PT is recommending SNF placement at discharge and they are in agreement with this- patient would like to go back to [place] since she was pleased with the care and is familiar with the staff, etc.   Assessment/plan status:  Other - See comment Other assessment/ plan:   Will update FL2 for SNF search   Information/referral to community resources:   SNF list    PATIENT'S/FAMILY'S RESPONSE TO PLAN OF CARE: Both patient and daughter are appreciative of CSW assist and in agreement with plans for SNF at d/c- they are hopeful for bed offer from [facility]. Patient reports feeling better now that she is on IV Antibiotics- and is relieved.    Eduard Clos, MSW, [place]

## 2014-07-24 NOTE — Progress Notes (Signed)
Family Medicine Teaching Service Daily Progress Note Intern Pager: [phone]  Patient name: [name] Medical record number: [id number] Date of birth: [birth year] Age: 63 y.o. Gender: female  Primary Care Provider: Lind Covert, MD Consultants: None Code Status: Full  Pt Overview and Major Events to Date:  - Pt. Admitted for pyelonephritis, weakness / malaise.   Assessment and Plan:  [name] is a 63 y.o. female presenting with generalized weakness / malaise for about 1 week as well as severe bilateral foot pain and inability to ambulate well at home, found to have UA suggestive of UTI with fever, WBC >24, and anemia that has been worsening during this hospitalization. PMH significant for DM type II, HTN, chronic normocytic anemia, systolic and diastolic CHF, asthma, osteoarthritis with chronic pain / muscle spasm; recently admitted for acute encephalopathy felt to be related to overuse of baclofen at home, and recently discharged from acute SNF following that hospitalization.   #Acute pyelonephritis - UA findings suggestive of infection, vague urinary complaints, elevated WBC, and fever  - s/p CTX x1 in the ED, continued, but began spiking temps 101.6 >101.2 >103 >102 during the first two days of her treatment, Cx drawn after antibiotics, but during febrile episode.  - Urine culture obtained prior to abx and growing E.Coli pansensitive.  - Blood cx added after abx in the ER, however obtained during am febrile episode. NGTD. - Pt. Catheterized over night due to inability to void; monitor I&O, daily weights given CHF (see below). Currently - +4.4L, -1Lb total.  - Tylenol for fever / pain, Zofran for nausea, morphine for pain  - Trend WBC 24 >26 > 19.6, kidney function mildly improving. Following am CBC / BMP - Discontinued Vanc/Zosyn and transitioned to Bactrim DS BID given pansensitive E.Coli - Renal ultrasound normal.    #Generalized weakness - uncertain etiology, likely  multifactorial (current infection, foot pain, anemia)  - PT / OT ordered for eval, they recommend readmission to SNF / Acute Care on discharge.  - Anemia requiring transfusion x 2 units yesterday (see individual problems below) trending CBC - total protein elevated with albumin low, will need to trend.On 8/23 Total protein 7.6, albumin 2.4, gamma gap 5.9., Corrected calcium 10.94. SPEP unremarkable.  - SPEP unremarkable for MGUS, or Multiple Myeloma.  - FOBT negative.CBC with Hgb of 9.2 >8   #Acute kidney injury - Cr 1.53 on admission, with GFR 40 (baseline appears normal as recently as Sept 2014-Feb 2015)  - poor PO intake recently in setting of acute infection combined with ACE use makes prerenal etiology most likely  - trending BMP's and rehydrate with gentle IVF; hold Lasix, ACE for now, Cr trending down, will f/u this am.   - SPEP unremarkable.  - Cr trending down 1.31 > 1.2  #Foot pain / hx chronic pain - unclear etiology, also likely multifactorial with hx osteoarthritis, ?plantar fasciitis recently diagnosed outpt, increased swelling in feet possibly due to CHF, and elevated uric acid at last hospitalization  - will manage with pain medication acutely (morphine PRN), and continue home baclofen, Neurontin 700BID at this point given CrCl. - PT / OT eval to check for functional status recommending discharge to SNF 2/2 decreased functionality with pain.  - Recent Imaging in July with evidence of global bony degenerative changes.  - Concern for acute gouty episode. Uric acid level 12.2. Unsure if this is truly acute gout given renal impairment. Also difficult to treat given renal impairment. Will adjust therapy as indicated.  Continue current regiment for now.  - Physical exam suggestive of improving edema.  - see also CHF below   #CHF - last echo October 2014, with EF 55-60 and grade 1 diastolic dysfunction (prior in 2013 with EF 29-57, grade 2 diastolic dysfunction), on spironolactone 25 mg  daily, benazepril 20 mg daily, metoprolol 25 mg BID, Lasix 80 mg PO daily at home  - not acutely fluid overloaded on lung exam, foot swelling improvement per physical exam. - continue home meds, for now, though hold ACE and Lasix while in acute kidney injury  - BNP elevated 199.1 this am. Will continue to monitor fluid status. Holding lasix for now given kidney injury.  - +4.4L as above. Will continue to monitor. CHF stable at this time.  - echo with EF 55=-60%, Trivial mitral / tricuspid regurg. Essentially normal.   #Normocytic anemia - chronic for at least the past few years, with recent baseline in 8-10 range  - Hb on admission 7.4, though no clear signs of active bleeding  - FOBT negative, iron studies in anemia of chronic disease picture, though Ferritin elevated with likely cause being acute infection.  - Retic low. Consistent with anemia of CKD. May require epopoietin. With iron supplementation as an outpatient.  - May need to consider IV iron as outpt.  - Hgb dropped to 6.2. Transfused PRBC's x 2 units. Will continue to trend CBC. 9.2 >8.0, will continue to trend prior to d/c.     #HTN - BP's generally in a well-controlled range; holding ACE / Lasix while in acute kidney injury  - ordered for hydralazine PRN for BP >160 / 90. Not requiring at this time.   #DM type II - on metformin and diet control, at home; recent A1c 7.1 (up from 6.1 earlier this year)  - hold metformin while admitted, especially with AKI  - ordered for SSI and CBG's qAC+HS  - Glucose 140's - 150's this am. Switched to resistant SSI, with Novolog 4u at mealtimes, and Lantus 10u QHS per DM Nutrition recs.   # Watery Diarrhea - No abdominal pain, WBC elevated, but due to current infection.  - C.Diff pcr negative.   #Asthma - no acute respiratory distress or wheezing; continue home albuterol PRN   #GERD - on Zantac daily at home, sub Pepcid while admitted  FEN/GI: NS @50  mL/h, heart-healthy / carb-modified diet   Prophylaxis: subQ heparin   Disposition: Begin planning d/c to SNF today. Overall improving with treatment and transfusion. Ensure CBC stable prior to d/c   Subjective:  Pt. States overall improvement in how she feels this am including her pain. She says that she has no questions, but that she is agreeable to discharge to rehab. She denies fevers, chills, nausea, vomiting, or abdominal / flank pain this am. She has no other complaints.   Objective: Temp:  [98 F (36.7 C)-98.7 F (37.1 C)] 98.7 F (37.1 C) (08/26 0622) Pulse Rate:  [70-76] 70 (08/26 0622) Resp:  [17-20] 17 (08/26 0622) BP: (110-114)/(60-71) 110/60 mmHg (08/26 0622) SpO2:  [95 %-100 %] 95 % (08/26 0622) Weight:  [224 lb 6.4 oz (101.787 kg)] 224 lb 6.4 oz (101.787 kg) (08/26 0519) Physical Exam: General: NAD, does not desire to sit up 2/2 pain, AAOx3 Cardiovascular: RRR, no MGR Respiratory: CTA Bilaterally given limited pulmonary exam. No audible crackles, or wheezes, clear in upper and lower lung fields bilaterally Abdomen: Soft, nontender, nondistended, +BS Extremities: Hands somewhat puffy, Feet with significant edema  2+,3+, dorsal /  pedal tenderness, 2+ Posterior Tibial pulses bilaterallly. Wrinkling of dorsal pedal skin noted for improvement of previously worse lower extremity edema, so reassuring overall. Callouses vs. Tophi noted at DIP joint of first toe bilaterally.  Laboratory:  Recent Labs Lab 07/22/14 1857 07/23/14 1110 07/24/14 0700  WBC 26.2* 24.8* 19.6*  HGB 6.2* 9.2* 8.0*  HCT 19.4* 28.5* 24.6*  PLT 426* 432* 464*    Recent Labs Lab 07/22/14 1315 07/23/14 0806 07/24/14 0700  NA 137 135* 139  K 5.1 4.6 4.2  CL 95* 96 100  CO2 27 23 25   BUN 27* 29* 28*  CREATININE 1.23* 1.31* 1.20*  CALCIUM 9.5 9.0 9.0  PROT 8.1 7.4 7.1  BILITOT 0.7 1.3* 0.7  ALKPHOS 129* 157* 164*  ALT 9 11 13   AST 15 21 19   GLUCOSE 220* 133* 174*    Pro BNP -199.1 Glucose - 148  Uric Acid - 12.2 SPEP -  Unremarkable.   Blood CX - NGTD x 3 days.  Urine CX - E.Coli  Reticulocyte count - Low absolute retic 0.6.   Aquilla Hacker, MD 07/24/2014, 9:49 AM PGY-1 [place] Intern pager: [phone], text pages welcome

## 2014-07-24 NOTE — Clinical Social Work Placement (Signed)
Clinical Social Work Department CLINICAL SOCIAL WORK PLACEMENT NOTE 07/24/2014  Patient:  [name], [name]  Account Number:  [id number] Admit date:  07/20/2014  Clinical Social Worker:  Daiva Huge  Date/time:  07/24/2014 10:37 AM  Clinical Social Work is seeking post-discharge placement for this patient at the following level of care:   [place]   (*CSW will update this form in Epic as items are completed)   07/24/2014  Patient/family provided with [place] Department of Clinical Social Work's list of facilities offering this level of care within the geographic area requested by the patient (or if unable, by the patient's family).  07/24/2014  Patient/family informed of their freedom to choose among providers that offer the needed level of care, that participate in Medicare, Medicaid or managed care program needed by the patient, have an available bed and are willing to accept the patient.  07/24/2014  Patient/family informed of [facility]' ownership interest in [facility], as well as of the fact that they are under no obligation to receive care at this facility.  PASARR submitted to EDS on 07/24/2014 PASARR number received on 07/24/2014  FL2 transmitted to all facilities in geographic area requested by pt/family on  07/24/2014 FL2 transmitted to all facilities within larger geographic area on   Patient informed that his/her managed care company has contracts with or will negotiate with  certain facilities, including the following:     Patient/family informed of bed offers received:   Patient chooses bed at  Physician recommends and patient chooses bed at    Patient to be transferred to  on   Patient to be transferred to facility by  Patient and family notified of transfer on  Name of family member notified:    The following physician request were entered in Epic:   Additional Comments: Eduard Clos, MSW, [place]

## 2014-07-25 DIAGNOSIS — M109 Gout, unspecified: Secondary | ICD-10-CM

## 2014-07-25 DIAGNOSIS — M130 Polyarthritis, unspecified: Secondary | ICD-10-CM | POA: Diagnosis present

## 2014-07-25 LAB — CBC WITH DIFFERENTIAL/PLATELET
Basophils Absolute: 0 10*3/uL (ref 0.0–0.1)
Basophils Relative: 0 % (ref 0–1)
EOS PCT: 2 % (ref 0–5)
Eosinophils Absolute: 0.4 10*3/uL (ref 0.0–0.7)
HEMATOCRIT: 25.6 % — AB (ref 36.0–46.0)
Hemoglobin: 8.2 g/dL — ABNORMAL LOW (ref 12.0–15.0)
Lymphocytes Relative: 18 % (ref 12–46)
Lymphs Abs: 3.4 10*3/uL (ref 0.7–4.0)
MCH: 28.5 pg (ref 26.0–34.0)
MCHC: 32 g/dL (ref 30.0–36.0)
MCV: 88.9 fL (ref 78.0–100.0)
MONOS PCT: 9 % (ref 3–12)
Monocytes Absolute: 1.7 10*3/uL — ABNORMAL HIGH (ref 0.1–1.0)
NEUTROS ABS: 13.6 10*3/uL — AB (ref 1.7–7.7)
Neutrophils Relative %: 71 % (ref 43–77)
Platelets: 510 10*3/uL — ABNORMAL HIGH (ref 150–400)
RBC: 2.88 MIL/uL — AB (ref 3.87–5.11)
RDW: 16.9 % — ABNORMAL HIGH (ref 11.5–15.5)
WBC: 19.1 10*3/uL — AB (ref 4.0–10.5)

## 2014-07-25 LAB — COMPREHENSIVE METABOLIC PANEL
ALT: 12 U/L (ref 0–35)
ANION GAP: 15 (ref 5–15)
AST: 17 U/L (ref 0–37)
Albumin: 2 g/dL — ABNORMAL LOW (ref 3.5–5.2)
Alkaline Phosphatase: 139 U/L — ABNORMAL HIGH (ref 39–117)
BILIRUBIN TOTAL: 0.4 mg/dL (ref 0.3–1.2)
BUN: 19 mg/dL (ref 6–23)
CHLORIDE: 103 meq/L (ref 96–112)
CO2: 21 meq/L (ref 19–32)
CREATININE: 1.17 mg/dL — AB (ref 0.50–1.10)
Calcium: 8.8 mg/dL (ref 8.4–10.5)
GFR calc Af Amer: 55 mL/min — ABNORMAL LOW (ref >90)
GFR, EST NON AFRICAN AMERICAN: 48 mL/min — AB (ref >90)
Glucose, Bld: 118 mg/dL — ABNORMAL HIGH (ref 70–99)
Potassium: 4.7 mEq/L (ref 3.7–5.3)
Sodium: 139 mEq/L (ref 137–147)
Total Protein: 7.1 g/dL (ref 6.0–8.3)

## 2014-07-25 LAB — GLUCOSE, CAPILLARY
Glucose-Capillary: 116 mg/dL — ABNORMAL HIGH (ref 70–99)
Glucose-Capillary: 194 mg/dL — ABNORMAL HIGH (ref 70–99)

## 2014-07-25 MED ORDER — PREDNISONE 5 MG PO TABS: 5.0000 mg | ORAL_TABLET | Freq: Every day | ORAL | Status: DC

## 2014-07-25 MED ORDER — SULFAMETHOXAZOLE-TMP DS 800-160 MG PO TABS: 1.0000 | ORAL_TABLET | Freq: Two times a day (BID) | ORAL | Status: DC

## 2014-07-25 MED ORDER — GABAPENTIN 100 MG PO CAPS: 700.0000 mg | ORAL_CAPSULE | Freq: Two times a day (BID) | ORAL | Status: DC

## 2014-07-25 MED ORDER — PREDNISONE 20 MG PO TABS
40.0000 mg | ORAL_TABLET | Freq: Every day | ORAL | Status: DC
  Administered 2014-07-25: 40 mg via ORAL
  Filled 2014-07-25 (×2): qty 2

## 2014-07-25 MED ORDER — PREDNISONE 20 MG PO TABS: 20.0000 mg | ORAL_TABLET | Freq: Every day | ORAL | Status: DC

## 2014-07-25 MED ORDER — HYDROCODONE-ACETAMINOPHEN 5-325 MG PO TABS: 1.0000 | ORAL_TABLET | Freq: Two times a day (BID) | ORAL | Status: DC | PRN

## 2014-07-25 MED ORDER — PREDNISONE 10 MG PO TABS: 10.0000 mg | ORAL_TABLET | Freq: Every day | ORAL | Status: DC

## 2014-07-25 NOTE — Discharge Summary (Addendum)
[facility] Discharge Summary  Patient name: [name] Medical record number: [id number] Date of birth: 07-Jun-1951 Age: 63 y.o. Gender: female Date of Admission: 07/20/2014  Date of Discharge: 07/25/14 Admitting Physician: Alveda Reasons, MD  Primary Care Provider: Lind Covert, MD Consultants: None  Indication for Hospitalization: Pyelonephritis  Discharge Diagnoses/Problem List:  Acute Pyelonephritis Acute Kidney Injury on Chronic Kidney Disease III Back Pain Hypertension Systolic CHF, Chronic Type II Diabetes Mellitus Asthma, mild persistent  Disposition: To SNF  Discharge Condition:  Stable  Discharge Exam:  GEN: NAD, AAOx3 HEENT: NCAT, PERRLA CV: RRR, No Murmurs, gallops, or rubs Resp: Clear to auscultation bilaterally, no crackles, or wheezes Abd: Nontender, Nondistended, Soft, No organomegally noted Extremities: Warm, Well perfused, 2+ distal pulses bilaterally, 2+ Edema of lower extremities, TTP over plantar surface of feet bilaterally, Corns vs. Tophi of DIP's of 1st digits bilaterally.  Neuro: Grossly neurologically intact.   Brief Hospital Course:  #Acute pyelonephritis - On admission patient had a clinical picture and laboratory analysis consistent with pyelonephritis. Initial UA findings suggestive of infection, she had vague urinary complaints, elevated WBC, and fever, as well as back pain. Urine cultures, and blood cultures were drawn. She was started on Ceftriaxone empirically given her clinical picture. She then began to spike fevers on Ceftriaxone initially and her antibiotics were broadened to Vancomycin and Zosyn. She did spike fevers two more times on Vanc / Zosyn, however she subsequently remained afebrile x 48 hours. Her urine culture was found to be positive for E.Coli that was pansensitive. Her blood cultures remained without growth x 3 days. Her antibiotics were narrowed from Vanc / Zosyn to Bactrim DS BID for a  total 10 days of po antibiotics. Additionally, she received pain control with morphine initially during her admission. Her WBC have been trending down prior to discharge. She has remained afebrile, and her vital signs have been stable. She is now stable and ready for discharge to SNF.   #Generalized weakness - Patient with weakness mostly related to generalized pain and refusal to move on admission. Pain and ?weakness of uncertain etiology, likely multifactorial ( infection, foot pain, anemia).  She was previously in acute rehab, and will need to return for further rehabilitation. Work up of infection, anemia, and chronic pain as below.   #Acute kidney injury - Cr 1.53 on admission, with GFR 40 (baseline appears normal as recently as Sept 2014-Feb 2015). Prior to admission she had very poor po intake and elevated BUN at admission indicating prerenal etiology. Cr trended down with gentle fluid resuscitation 1.53 > 1.2 at discharge. Her GFR remained around 40 with CKD stage IIIAvs.B. Additionally, there was some concern that she may have nephropathy related to buildup of monoclonal antibodies given her gamma gap >4 and mildly elevated calcium at admission. An SPEP was performed and was negative for any monoclonal spikes, or M spikes. She will likely need outpatient nephrology care as she is also anemic with a low reticulocyte count likely related to poorly functioning kidneys and insufficient epopoietin production.   #Foot pain / hx chronic pain - unclear etiology, also likely multifactorial with hx osteoarthritis as well as radiological evidence of arthritis, ?plantar fasciitis recently diagnosed outpt, increased swelling in feet possibly due to CHF, and elevated uric acid at last hospitalization and during this hospitalization. Her pain improved throughout her hospitalization, and especially with improvement in her lower extremity swelling. Also See addendum below.   #CHF - last echo October 2014, with  EF  55-60 and grade 1 diastolic dysfunction (prior in 2013 with EF 48-25, grade 2 diastolic dysfunction), on spironolactone 25 mg daily, benazepril 20 mg daily, metoprolol 25 mg BID, Lasix 80 mg PO daily at home. She was not fluid overloaded clinically at any time during this hospitalization despite being in a fluid positive status. Her lasix was held because of her acute kidney injury, however she will restart these along with her other medications at discharge. An echo during this admission revealed an EF of 55-60% with trivial mitral / tricuspid regurgitation.   #Normocytic anemia - chronic for at least the past few years, with recent baseline in 8-10 range. Her Hgb dropped to 6.2 during this admission requiring transfusion of 2 units of PRBC's. FOBT card was negative. She does have a low reticulocyte count, and absolute reticulocyte count. No signs of active bleeding during this admission. This may be related to her chronic kidney disease given her inappropriate reticulocyte response. She may require epopoietin as an outpatient with iron supplementation. Her hemoglobin was stable at discharge, and trending up from Hgb 8 > Hgb 8.2  #HTN - BP's generally in a well-controlled range; held ACE / Lasix while in acute kidney injury. She was prescribed, but did not require prn hydralazine for management of her blood pressures.   #DM type II - Held metformin during admission. Given resistant SSI, Novolog 4u with meals, and Lantus 10u QHS. Blood glucose 140's -150's during hospitalization.   # Watery Diarrhea - Occurred x 3 during hospitalization. C.Diff pcr negative. Diarrhea resolved.   #Asthma - no acute respiratory distress or wheezing; continued on home albuterol prn during admission.   #GERD - on Zantac daily at home, given Pepcid while admitted    Issues for Follow Up:  1. Follow up Urinary Symptoms, and improvement with antibiotics. Taking Bactrim DS twice daily to be continued through 08/02/2014.   2.  Anemia - Anemia of Chronic Disease picture with a low reticulocyte count despite Hgb of 6.2. Possible etiology is renal insufficiency and may require epo despite CKD being only stage III A-B. Will need outpatient workup.  3. Foot pain - follow up improvement in swelling, likely combo of swelling, OA, and ?Gout given large corns vs. ?tophi on bilateral 1st DIP joints. Follow up improvement.  4. CHF - Echo essentially normal during hospital stay. Not symptomatic despite fluid +4L during this admission. Follow up symptoms.   Significant Procedures: None  Significant Labs and Imaging:   Recent Labs Lab 07/23/14 1110 07/24/14 0700 07/25/14 0655  WBC 24.8* 19.6* 19.1*  HGB 9.2* 8.0* 8.2*  HCT 28.5* 24.6* 25.6*  PLT 432* 464* 510*    Recent Labs Lab 07/20/14 1342 07/21/14 0520 07/22/14 1315 07/23/14 0806 07/24/14 0700 07/25/14 0655  NA 131* 134* 137 135* 139 139  K 4.9 4.9 5.1 4.6 4.2 4.7  CL 88* 93* 95* 96 100 103  CO2 24 27 27 23 25 21   GLUCOSE 247* 243* 220* 133* 174* 118*  BUN 35* 28* 27* 29* 28* 19  CREATININE 1.53* 1.25* 1.23* 1.31* 1.20* 1.17*  CALCIUM 10.0 9.1 9.5 9.0 9.0 8.8  MG  --  1.7  --   --   --   --   ALKPHOS 120* 108 129* 157* 164* 139*  AST 16 11 15 21 19 17   ALT 8 7 9 11 13 12   ALBUMIN 2.8* 2.4* 2.2* 2.0* 2.0* 2.0*    Uric Acid - 12.6 SPEP -  C.Diff -  negative FOBT - negative TSH - 1.93 Reticulocytes - Retic Pct: 0.7, RBC - 2.28, Retic Count: 16  Iron - 10 TIBC - 202 Saturation - 5 UIBC - 192 Ferritin - 654  A1C - 7.1   US Renal  07/21/2014   CLINICAL DATA:  Pyelonephritis  EXAM: RENAL/URINARY TRACT ULTRASOUND COMPLETE  COMPARISON:  06/03/2014  FINDINGS: Right Kidney:  Length: 12.2 cm. Echogenicity within normal limits. No mass or hydronephrosis visualized.  Left Kidney:  Length: 12.4 cm. Echogenicity within normal limits. No mass or hydronephrosis visualized.  Bladder:  Appears normal for degree of bladder distention.  IMPRESSION: 1. Normal  exam.   Electronically Signed   By: Kerby Moors M.D.   On: 07/21/2014 17:08   Echocardiogram:  Study Conclusions  - Left ventricle: The cavity size was normal. Systolic function was normal. The estimated ejection fraction was in the range of 55% to 60%. Wall motion was normal; there were no regional wall motion abnormalities. - Aortic valve: There was trivial regurgitation. - Mitral valve: There was trivial regurgitation.     Results/Tests Pending at Time of Discharge: None  Discharge Medications:    Medication List         albuterol 108 (90 BASE) MCG/ACT inhaler  Commonly known as:  PROVENTIL HFA;VENTOLIN HFA  Inhale 2 puffs into the lungs every 6 (six) hours as needed for wheezing or shortness of breath.     aspirin 81 MG tablet  Take 81 mg by mouth daily.     baclofen 10 MG tablet  Commonly known as:  LIORESAL  Take 10 mg by mouth 3 (three) times daily as needed for muscle spasms.     benazepril 20 MG tablet  Commonly known as:  LOTENSIN  Take 1 tablet (20 mg total) by mouth daily.     fish oil-omega-3 fatty acids 1000 MG capsule  Take 1 g by mouth 2 (two) times daily.     fluticasone 110 MCG/ACT inhaler  Commonly known as:  FLOVENT HFA  Inhale 2 puffs into the lungs 2 (two) times daily.     furosemide 40 MG tablet  Commonly known as:  LASIX  Take 80 mg by mouth every morning.     gabapentin 100 MG capsule  Commonly known as:  NEURONTIN  Take 7 capsules (700 mg total) by mouth 2 (two) times daily.     HYDROcodone-acetaminophen 5-325 MG per tablet  Commonly known as:  NORCO/VICODIN  Take 1 tablet by mouth every 12 (twelve) hours as needed for moderate pain.     metFORMIN 500 MG tablet  Commonly known as:  GLUCOPHAGE  Take 0.5 tablets (250 mg total) by mouth 2 (two) times daily with a meal.     metoprolol tartrate 25 MG tablet  Commonly known as:  LOPRESSOR  take 1 tablet by mouth twice a day     ranitidine 150 MG tablet  Commonly known as:  ZANTAC   Take 150 mg by mouth at bedtime.     spironolactone 25 MG tablet  Commonly known as:  ALDACTONE  Take 25 mg by mouth every morning.     sulfamethoxazole-trimethoprim 800-160 MG per tablet  Commonly known as:  BACTRIM DS  Take 1 tablet by mouth 2 (two) times daily.     trolamine salicylate 10 % cream  Commonly known as:  ASPERCREME  Apply 1 application topically as needed for muscle pain.     UROCIT-K 10 10 MEQ (1080 MG) SR tablet  Generic drug:  potassium citrate  Take 10 mEq by mouth 2 (two) times daily.        Discharge Instructions: Please refer to Patient Instructions section of EMR for full details.  Patient was counseled important signs and symptoms that should prompt return to medical care, changes in medications, dietary instructions, activity restrictions, and follow up appointments.   Follow-Up Appointments: Follow-up Information   Follow up with [facility]ist [facility]. (Per SNF providers. See discharge summary recommendations. )       Aquilla Hacker, MD 07/25/2014, 12:29 PM PGY-1, [place]  Additionally, Pt. Will need a short steroid taper due to a concern for gout flare up. She will receive prednisone 20mg  po x 2 days, 10mg  po x 2 days, and 5mg  po x 2 days. She will stop prednisone after this.   She will also need to have her urine output monitored at the SNF due to recent discharge of her foley catheter and her history of urinary retention.   Thanks for taking care of our patient!   Paula Compton, MD Family Medicine PGY 1

## 2014-07-25 NOTE — Clinical Social Work Note (Signed)
Patient for d/c today to SNF bed at [facility] where she recently was for rehab- she is pleased that they can take her back- her family also is aware and in agreement.  Will plan transfer via EMS. Eduard Clos, MSW, [place]

## 2014-07-25 NOTE — Progress Notes (Signed)
Pt prepared for d/c to SNF. IV and foley d/c'd. Skin intact except as most recently charted. Vitals are stable. Report called to receiving facility. Notified Magda Paganini, the nurse taking report that the patient's foley had recently been removed and that the patient had not voided. Informed nurse that the physician is aware and stated it would be in her d/c summary to monitor urinary output. Pt to be transported by ambulance service.

## 2014-07-25 NOTE — Discharge Instructions (Signed)
Pt. Being discharged after treatment of pyelonephritis with pansensitive E.Coli. She is being discharged on Bactrim DS twice daily to be continued until 08/02/14.   Additionally, pt. Has been started on a 6 day steroid taper for her Gout flare given that she cannot have NSAID's due to her CKD. She will receive 20mg  po x 2 days, 10mg  po x 2 days, and 5mg  po x 2 days.   She has also recently had her foley catheter discontinued, and she will need to have her urine output monitored to ensure that she does not have urinary retention after her discharge.   She will need acute Physical Therapy and Occupational Therapy evaluation and treatment.   For her other problems see the discharge summary and recommendations regarding further medical management.   Paula Compton, MD Family Medicine PGY 1

## 2014-07-25 NOTE — Clinical Social Work Placement (Signed)
Clinical Social Work Department CLINICAL SOCIAL WORK PLACEMENT NOTE 07/25/2014  Patient:  [name], [name]  Account Number:  [id number] Admit date:  07/20/2014  Clinical Social Worker:  Daiva Huge  Date/time:  07/24/2014 10:37 AM  Clinical Social Work is seeking post-discharge placement for this patient at the following level of care:   [place]   (*CSW will update this form in Epic as items are completed)   07/24/2014  Patient/family provided with [place] Department of Clinical Social Work's list of facilities offering this level of care within the geographic area requested by the patient (or if unable, by the patient's family).  07/24/2014  Patient/family informed of their freedom to choose among providers that offer the needed level of care, that participate in Medicare, Medicaid or managed care program needed by the patient, have an available bed and are willing to accept the patient.  07/24/2014  Patient/family informed of [facility]' ownership interest in [facility], as well as of the fact that they are under no obligation to receive care at this facility.  PASARR submitted to EDS on 07/24/2014 PASARR number received on 07/24/2014  FL2 transmitted to all facilities in geographic area requested by pt/family on  07/24/2014 FL2 transmitted to all facilities within larger geographic area on   Patient informed that his/her managed care company has contracts with or will negotiate with  certain facilities, including the following:     Patient/family informed of bed offers received:  07/25/2014 Patient chooses bed at [facility] Physician recommends and patient chooses bed at    Patient to be transferred to [facility] on  07/25/2014 Patient to be transferred to facility by [facility] Patient and family notified of transfer on 07/25/2014 Name of family member notified:  son- [name]  The following physician request were  entered in Epic:   Additional Comments:   Eduard Clos, MSW, Latanya Presser [phone]

## 2014-07-25 NOTE — Progress Notes (Addendum)
Occupational Therapy Treatment Patient Details Name: [name] MRN: [id number] DOB: 18-Nov-1951 Today's Date: 07/25/2014    History of present illness Pt is a 63 y.o. female presenting with generalized weakness / malaise for about 1 week as well as severe bilateral foot pain and inability to ambulate well at home, found to have UA suggestive of UTI with fever, WBC >24, and anemia worse than previously but no signs of active bleeding. PMH significant for DM type II, HTN, chronic normocytic anemia, systolic and diastolic CHF, asthma, osteoarthritis with chronic pain / muscle spasm; recently admitted for acute encephalopathy felt to be related to overuse of baclofen at home, and recently discharged from acute SNF following that hospitalization.   OT comments  Pt making slow progress with functional goals. Pt limiting pt's function. Pt should continue with acute OT services to increase level of function and safety  Follow Up Recommendations  SNF    Equipment Recommendations  None recommended by OT    Recommendations for Other Services      Precautions / Restrictions Precautions Precautions: Fall Restrictions Weight Bearing Restrictions: No       Mobility Bed Mobility Overal bed mobility: Needs Assistance Bed Mobility: Supine to Sit;Sit to Supine   Sidelying to sit: HOB elevated;Min assist Supine to sit: Min-mod assist Sit to supine: Min-mod assist +2 physical assist to scoot to [facility]      Transfers Overall transfer level: Needs assistance   Transfers: Sit to/from Stand Sit to Stand: Mod assist              Balance Overall balance assessment: Needs assistance Sitting-balance support: Single extremity supported;Feet supported Sitting balance-Leahy Scale: Poor     Standing balance support: Bilateral upper extremity supported;During functional activity Standing balance-Leahy Scale: Poor                     ADL   Eating/Feeding: Moderate assistance;Sitting  (mod A to bring cup to mouth for simulated drinking)   Grooming: Wash/dry hands;Wash/dry face;Oral care;Moderate assistance;Sitting   Upper Body Bathing: Sitting;Maximal assistance                             General ADL Comments: Pt requires increased time due to pain      Vision  no change from baseline                   Perception Perception Perception Tested?: No   Praxis Praxis Praxis tested?: Not tested    Cognition   Behavior During Therapy: [facility] for tasks assessed/performed Overall Cognitive Status: Within Functional Limits for tasks assessed                       Extremity/Trunk Assessment   impaired due to pain                        General Comments  pt reports pain all over, limits function    Pertinent Vitals/ Pain       Pain Score: 6  Pain Location: B LEs (bottoms of feet), R shoulder and L UE Pain Descriptors / Indicators: Tender;Sore Pain Intervention(s): Limited activity within patient's tolerance;Monitored during session;Repositioned                   Frequency Min 2X/week     Progress Toward Goals  OT Goals(current goals can now be found in  the care plan section)  Progress towards OT goals: OT to reassess next treatment     Plan Discharge plan remains appropriate                     End of Session Equipment Utilized During Treatment: Gait belt;Other (comment) (BSC)   Activity Tolerance Patient limited by pain;Patient limited by fatigue   Patient Left in bed;with call bell/phone within reach             Time: 1040-1108 OT Time Calculation (min): 28 min  Charges: OT General Charges $OT Visit: 1 Procedure OT Treatments $Self Care/Home Management : 8-22 mins $Therapeutic Activity: 8-22 mins  Britt Bottom 07/25/2014, 1:44 PM

## 2014-07-25 NOTE — Discharge Summary (Signed)
I have seen and examined this patient. I have discussed with Dr Minda Ditto.  I agree with their findings and plans as documented in their progress note.  Acute Issues 1. Acute worsening of right wrist and right elbow pain - Last dose of ketorolac NSAID IV yesterday morning.  - Physical exam shows increased warmth in right hand/wrist/ forearm/elbow with edema and tenderness to light palpation.   Pain with active and passive ROM movement of right wrist and right elbow. - Serum uric acid 12.3 mg/dL (07/21/14) - Working diagnosis is Acute polyarticular gouty arthritis  2. Acute Pyelonephritis - Improving  - Urine Culture with pansensitive E. Coli.  - Agree with oral antibiotics for total of 10 days of effective antibiotic therapy.  3. Peripheral painful neuropathy - Pt reports slight improvement in peripheral neuropathy pain in feet with use of lidoderm patches.    Recommendations - Start prednisone 40 mg daily and assess for some improvement in acute wrist and elbow pain with 12 hours of first prednisone dose.  If prednisone helpful, continue for 3 days beyond resolution of inflammation.  Ice packs and arm elevation.  - Agree with Septa DS i tab BID until 07/30/14 - Continue (new inhouse prescription) Lidoderm patches at discharge for painful peripheral neuropathy of both feet. Continue Gabapentin at 700 mg bid. Recommend trial of tramadol scheduled for continued peripheral neuropathy pain at SNF.

## 2014-07-26 ENCOUNTER — Ambulatory Visit: Payer: Medicare Other

## 2014-07-26 DIAGNOSIS — M109 Gout, unspecified: Secondary | ICD-10-CM | POA: Diagnosis present

## 2014-07-26 NOTE — Discharge Summary (Signed)
I have seen and examined this patient. I have discussed with Dr Minda Ditto.  I agree with their findings and plans as documented in their discharge note.  Additionally, pt had what appeared to be an acute gouty arthritis attack in right wrist and right elbow.  Pt to have 40 mg prednisone daily for 3 to 5 days beyond resolution of symptoms.

## 2014-07-27 LAB — CULTURE, BLOOD (ROUTINE X 2)
Culture: NO GROWTH
Culture: NO GROWTH

## 2014-07-30 ENCOUNTER — Other Ambulatory Visit: Payer: Self-pay | Admitting: Family Medicine

## 2014-09-02 ENCOUNTER — Ambulatory Visit (INDEPENDENT_AMBULATORY_CARE_PROVIDER_SITE_OTHER): Payer: PRIVATE HEALTH INSURANCE | Admitting: Family Medicine

## 2014-09-02 ENCOUNTER — Encounter: Payer: Self-pay | Admitting: Family Medicine

## 2014-09-02 VITALS — BP 108/70 | HR 65 | Temp 97.9°F | Wt 224.0 lb

## 2014-09-02 DIAGNOSIS — N183 Chronic kidney disease, stage 3 unspecified: Secondary | ICD-10-CM

## 2014-09-02 DIAGNOSIS — Z23 Encounter for immunization: Secondary | ICD-10-CM

## 2014-09-02 NOTE — Patient Instructions (Addendum)
Get your labs done the week before your next appointment  Chronic Kidney Disease Chronic kidney disease occurs when the kidneys are damaged over a long period. The kidneys are two organs that lie on either side of the spine between the middle of the back and the front of the abdomen. The kidneys:   Remove wastes and extra water from the blood.   Produce important hormones. These help keep bones strong, regulate blood pressure, and help create red blood cells.   Balance the fluids and chemicals in the blood and tissues. A small amount of kidney damage may not cause problems, but a large amount of damage may make it difficult or impossible for the kidneys to work the way they should. If steps are not taken to slow down the kidney damage or stop it from getting worse, the kidneys may stop working permanently. Most of the time, chronic kidney disease does not go away. However, it can often be controlled, and those with the disease can usually live normal lives. CAUSES  The most common causes of chronic kidney disease are diabetes and high blood pressure (hypertension). Chronic kidney disease may also be caused by:   Diseases that cause the kidneys' filters to become inflamed.   Diseases that affect the immune system.   Genetic diseases.   Medicines that damage the kidneys, such as anti-inflammatory medicines.  Poisoning or exposure to toxic substances.   A reoccurring kidney or urinary infection.   A problem with urine flow. This may be caused by:   Cancer.   Kidney stones.   An enlarged prostate in males. SIGNS AND SYMPTOMS  Because the kidney damage in chronic kidney disease occurs slowly, symptoms develop slowly and may not be obvious until the kidney damage becomes severe. A person may have a kidney disease for years without showing any symptoms. Symptoms can include:   Swelling (edema) of the legs, ankles, or feet.   Tiredness (lethargy).   Nausea or vomiting.    Confusion.   Problems with urination, such as:   Decreased urine production.   Frequent urination, especially at night.   Frequent accidents in children who are potty trained.   Muscle twitches and cramps.   Shortness of breath.  Weakness.   Persistent itchiness.   Loss of appetite.  Metallic taste in the mouth.  Trouble sleeping.  Slowed development in children.  Short stature in children. DIAGNOSIS  Chronic kidney disease may be detected and diagnosed by tests, including blood, urine, imaging, or kidney biopsy tests.  TREATMENT  Most chronic kidney diseases cannot be cured. Treatment usually involves relieving symptoms and preventing or slowing the progression of the disease. Treatment may include:   A special diet. You may need to avoid alcohol and foods thatare salty and high in potassium.   Medicines. These may:   Lower blood pressure.   Relieve anemia.   Relieve swelling.   Protect the bones. HOME CARE INSTRUCTIONS   Follow your prescribed diet.   Take medicines only as directed by your health care provider. Do not take any new medicines (prescription, over-the-counter, or nutritional supplements) unless approved by your health care provider. Many medicines can worsen your kidney damage or need to have the dose adjusted.   Quit smoking if you smoke. Talk to your health care provider about a smoking cessation program.   Keep all follow-up visits as directed by your health care provider. SEEK IMMEDIATE MEDICAL CARE IF:  Your symptoms get worse or you develop new  symptoms.   You develop symptoms of end-stage kidney disease. These include:   Headaches.   Abnormally dark or light skin.   Numbness in the hands or feet.   Easy bruising.   Frequent hiccups.   Menstruation stops.   You have a fever.   You have decreased urine production.   You havepain or bleeding when urinating. MAKE SURE YOU:  Understand  these instructions.  Will watch your condition.  Will get help right away if you are not doing well or get worse. FOR MORE INFORMATION   American Association of Kidney Patients: BombTimer.gl  National Kidney Foundation: www.kidney.[place]: https://mathis.com/  [facility]: www.lifeoptions.org and www.kidneyschool.org Document Released: 08/24/2008 Document Revised: 04/01/2014 Document Reviewed: 07/14/2012 [facility] Patient Information 2015 [place], [place]. This information is not intended to replace advice given to you by your health care provider. Make sure you discuss any questions you have with your health care provider.

## 2014-09-02 NOTE — Progress Notes (Signed)
   Subjective:    Patient ID: [name], female    DOB: 04-07-1951, 63 y.o.   MRN: [id number]  HPI  Here for hospital discharge follow up, admitted with pyelonephritis, also found to be significantly anemia, ?gouty flare.  Discharge Friday from rehabilitation at [facility].  Has not picked up prescriptions yet.  Pyelo: no dysyria, no fevers/sweats/chills, reports finishing abx at rehab  Anemia: iron def and likely anemia of chronic disease - colonoscopy 2012, mild divertic, repeat 10 years Iron - 10  TIBC - 202  Saturation - 5  UIBC - 192  Ferritin - 654  CHF: no PND, no orthopnea, does have DOE - heart cath 05/02/2012: no evid of CAD, severe LV systolic dysfunction, elevated filling pressures ==> nonischemic cardiomyopathy    Review of Systems  Constitutional: Negative for chills and diaphoresis.  Respiratory: Positive for shortness of breath (exertional). Negative for cough.   Cardiovascular: Positive for leg swelling.  Gastrointestinal: Negative for abdominal pain, diarrhea, constipation and anal bleeding.  Endocrine: Negative for cold intolerance and heat intolerance.  Genitourinary: Negative for dysuria, urgency, decreased urine volume and difficulty urinating.  Neurological: Negative for headaches.       Objective:   Physical Exam  Constitutional: She appears well-developed and well-nourished. No distress.  HENT:  Head: Normocephalic and atraumatic.  Eyes: Conjunctivae are normal. Pupils are equal, round, and reactive to light.  Neck: Normal range of motion. Neck supple. No thyromegaly present.  Cardiovascular: Normal rate.   Pulmonary/Chest: Effort normal. No respiratory distress.  Abdominal: Soft. She exhibits no distension. There is no tenderness.  Musculoskeletal: She exhibits edema (LE 1+).  Neurological: She is alert.  Skin: Skin is warm and dry. No rash noted. She is not diaphoretic. No erythema.  Psychiatric: She has a normal mood and affect. Her behavior  is normal.  BP 108/70  Pulse 65  Temp(Src) 97.9 F (36.6 C) (Oral)  Wt 224 lb (101.606 kg)      Assessment & Plan:   Pap: needs repeat, last 2011; address at next visit  DM: A1c 7.1, well controlled, has ophtho appt this month - lipid panel ordered  CKD: CKD III, caution with NSAIDS.  Currently on mobic and metformin, will repeat CMP and discontinue if needed - CBC; Future - Comp Met (CMET); Future - Protein / Creatinine Ratio, Urine; Future - PTH, Intact and Calcium; Future - Vitamin D (25 hydroxy); Future  Anemia: normal colonoscopy, iron def and anemia of chronic disease, may also have component of CKD.  Will order PTH, vitamin D and follow up  CKD (chronic kidney disease) stage 3, GFR 30-59 ml/min - dexa scan ordered.  CHF: low salt diet reinforced, also importance of taking medications.  ASA, aldactone, metoprolol, lotensin - blood pressure well controlled, monitor careful to prevent hypotension  Kirsten Shankar ROCIO, MD 2:22 PM

## 2014-09-11 ENCOUNTER — Other Ambulatory Visit: Payer: PRIVATE HEALTH INSURANCE

## 2014-09-11 DIAGNOSIS — N183 Chronic kidney disease, stage 3 unspecified: Secondary | ICD-10-CM

## 2014-09-11 LAB — LIPID PANEL
Cholesterol: 236 mg/dL — ABNORMAL HIGH (ref 0–200)
HDL: 45 mg/dL (ref >39)
LDL CALC: 136 mg/dL — AB (ref 0–99)
Total CHOL/HDL Ratio: 5.2 Ratio
Triglycerides: 277 mg/dL — ABNORMAL HIGH (ref <150)
VLDL: 55 mg/dL — AB (ref 0–40)

## 2014-09-11 LAB — COMPREHENSIVE METABOLIC PANEL
ALT: 10 U/L (ref 0–35)
AST: 14 U/L (ref 0–37)
Albumin: 3.8 g/dL (ref 3.5–5.2)
Alkaline Phosphatase: 73 U/L (ref 39–117)
BILIRUBIN TOTAL: 0.5 mg/dL (ref 0.2–1.2)
BUN: 58 mg/dL — ABNORMAL HIGH (ref 6–23)
CO2: 28 meq/L (ref 19–32)
CREATININE: 1.9 mg/dL — AB (ref 0.50–1.10)
Calcium: 9.4 mg/dL (ref 8.4–10.5)
Chloride: 103 mEq/L (ref 96–112)
Glucose, Bld: 138 mg/dL — ABNORMAL HIGH (ref 70–99)
Potassium: 5 mEq/L (ref 3.5–5.3)
SODIUM: 141 meq/L (ref 135–145)
Total Protein: 7.3 g/dL (ref 6.0–8.3)

## 2014-09-11 NOTE — Progress Notes (Signed)
Drew CMET, LIPID, CBC. PTH intact, Vit D, Urine protein/creat ratio

## 2014-09-12 LAB — PROTEIN / CREATININE RATIO, URINE: CREATININE, URINE: 41.2 mg/dL

## 2014-09-12 LAB — CBC
HCT: 32.4 % — ABNORMAL LOW (ref 36.0–46.0)
Hemoglobin: 10.3 g/dL — ABNORMAL LOW (ref 12.0–15.0)
MCH: 27.3 pg (ref 26.0–34.0)
MCHC: 31.8 g/dL (ref 30.0–36.0)
MCV: 85.9 fL (ref 78.0–100.0)
Platelets: 251 10*3/uL (ref 150–400)
RBC: 3.77 MIL/uL — AB (ref 3.87–5.11)
RDW: 18.2 % — ABNORMAL HIGH (ref 11.5–15.5)
WBC: 9.6 10*3/uL (ref 4.0–10.5)

## 2014-09-12 LAB — PTH, INTACT AND CALCIUM
CALCIUM: 9.4 mg/dL (ref 8.4–10.5)
PTH: 123 pg/mL — ABNORMAL HIGH (ref 14–64)

## 2014-09-12 LAB — VITAMIN D 25 HYDROXY (VIT D DEFICIENCY, FRACTURES): Vit D, 25-Hydroxy: 12 ng/mL — ABNORMAL LOW (ref 30–89)

## 2014-09-18 ENCOUNTER — Ambulatory Visit (INDEPENDENT_AMBULATORY_CARE_PROVIDER_SITE_OTHER): Payer: PRIVATE HEALTH INSURANCE | Admitting: Family Medicine

## 2014-09-18 ENCOUNTER — Encounter: Payer: Self-pay | Admitting: Family Medicine

## 2014-09-18 VITALS — BP 131/85 | HR 54 | Temp 98.0°F | Wt 223.0 lb

## 2014-09-18 DIAGNOSIS — I10 Essential (primary) hypertension: Secondary | ICD-10-CM

## 2014-09-18 DIAGNOSIS — F329 Major depressive disorder, single episode, unspecified: Secondary | ICD-10-CM

## 2014-09-18 DIAGNOSIS — F32A Depression, unspecified: Secondary | ICD-10-CM

## 2014-09-18 DIAGNOSIS — R7989 Other specified abnormal findings of blood chemistry: Secondary | ICD-10-CM | POA: Insufficient documentation

## 2014-09-18 DIAGNOSIS — R748 Abnormal levels of other serum enzymes: Secondary | ICD-10-CM

## 2014-09-18 LAB — BASIC METABOLIC PANEL
BUN: 39 mg/dL — ABNORMAL HIGH (ref 6–23)
CALCIUM: 9.3 mg/dL (ref 8.4–10.5)
CO2: 27 meq/L (ref 19–32)
Chloride: 106 mEq/L (ref 96–112)
Creat: 1.59 mg/dL — ABNORMAL HIGH (ref 0.50–1.10)
Glucose, Bld: 120 mg/dL — ABNORMAL HIGH (ref 70–99)
Potassium: 4.7 mEq/L (ref 3.5–5.3)
SODIUM: 142 meq/L (ref 135–145)

## 2014-09-18 NOTE — Assessment & Plan Note (Signed)
Controlled today.  Continue current medications recheck bmet.  If crt elevatiion continues may cut backon acei

## 2014-09-18 NOTE — Progress Notes (Signed)
   Subjective:    Patient ID: [name], female    DOB: 04-24-1951, 63 y.o.   MRN: [id number]  HPI  UTI - resolved is out of rehabilitation  CHF - taking all her medications as prescribed.  No edema.  Some lightheadness with standing.  No unusual shortness of breath  HYPERTENSION Disease Monitoring Home BP Monitoring not checking Chest pain- no    Dyspnea- none new Medications Compliance-  regularly. Lightheadedness-  Mild   Edema- none ROS - See HPI  PMH Lab Review   Potassium  Date Value Ref Range Status  09/11/2014 5.0  3.5 - 5.3 mEq/L Final     Sodium  Date Value Ref Range Status  09/11/2014 141  135 - 145 mEq/L Final     Creat  Date Value Ref Range Status  09/11/2014 1.90* 0.50 - 1.10 mg/dL Final     Creatinine, Ser  Date Value Ref Range Status  07/25/2014 1.17* 0.50 - 1.10 mg/dL Final     Arthritis Fairly well controlled with current medications.  No joint swelling  Elevated Crt Just  Noticed on recent blood work.  Uses ibuprofen rarely no urine symptoms   Depression Stressors Her husband died 2 days ago - was expected.  Will be living with her daughter at least for a while.  Moderate sleeping difficulty  Chief Complaint noted Review of Symptoms - see HPI PMH - Smoking status noted.   Vital Signs reviewed     Review of Systems     Objective:   Physical Exam  Alert no acute distress Heart - Regular rate and rhythm.  No murmurs, gallops or rubs.    Lungs:  Normal respiratory effort, chest expands symmetrically. Lungs are clear to auscultation, no crackles or wheezes. Extrem - no edema      Assessment & Plan:

## 2014-09-18 NOTE — Patient Instructions (Signed)
Good to see you today!  Thanks for coming in.  Medication Changes  Lasix - Furosemide - 1/2 tablet (40 mg) once a day Do not take any ibuprofen or naprosyn or goodie powders Baclofen - take 1/2 tablet twice daily  Vicoden - take only for bad pain  Come back in 2 weeks  Bring all your medication bottles - clean out the whole house

## 2014-09-18 NOTE — Assessment & Plan Note (Signed)
Recent stressor with husbands death and chaning living situation.  Monitor for worsening depression

## 2014-09-18 NOTE — Assessment & Plan Note (Signed)
New on recent bmet.  Wonder if due to prerenal volume depletion.  Cut back on lasix, no nsaids and repeat it today

## 2014-09-30 ENCOUNTER — Telehealth: Payer: Self-pay | Admitting: *Deleted

## 2014-09-30 NOTE — Telephone Encounter (Signed)
Received a refill request for colchicine 0.6 mg tab.  Medication is not listed on current med list.  Form placed in provider box for review.  Derl Barrow, RN

## 2014-10-01 ENCOUNTER — Other Ambulatory Visit: Payer: Self-pay | Admitting: Family Medicine

## 2014-10-01 NOTE — Telephone Encounter (Signed)
Do not think she was taking this at her most recent visit - she did not bring with her.  Would not refill

## 2014-10-02 ENCOUNTER — Ambulatory Visit (INDEPENDENT_AMBULATORY_CARE_PROVIDER_SITE_OTHER): Payer: PRIVATE HEALTH INSURANCE | Admitting: Family Medicine

## 2014-10-02 VITALS — BP 130/85 | HR 61 | Temp 98.1°F | Ht 62.0 in | Wt 221.5 lb

## 2014-10-02 DIAGNOSIS — M150 Primary generalized (osteo)arthritis: Secondary | ICD-10-CM

## 2014-10-02 DIAGNOSIS — R748 Abnormal levels of other serum enzymes: Secondary | ICD-10-CM

## 2014-10-02 DIAGNOSIS — R7989 Other specified abnormal findings of blood chemistry: Secondary | ICD-10-CM

## 2014-10-02 DIAGNOSIS — E119 Type 2 diabetes mellitus without complications: Secondary | ICD-10-CM

## 2014-10-02 DIAGNOSIS — M159 Polyosteoarthritis, unspecified: Secondary | ICD-10-CM

## 2014-10-02 DIAGNOSIS — I10 Essential (primary) hypertension: Secondary | ICD-10-CM

## 2014-10-02 DIAGNOSIS — M109 Gout, unspecified: Secondary | ICD-10-CM

## 2014-10-02 LAB — BASIC METABOLIC PANEL
BUN: 16 mg/dL (ref 6–23)
CO2: 26 meq/L (ref 19–32)
Calcium: 9.5 mg/dL (ref 8.4–10.5)
Chloride: 102 mEq/L (ref 96–112)
Creat: 1.19 mg/dL — ABNORMAL HIGH (ref 0.50–1.10)
GLUCOSE: 99 mg/dL (ref 70–99)
POTASSIUM: 4.5 meq/L (ref 3.5–5.3)
Sodium: 139 mEq/L (ref 135–145)

## 2014-10-02 LAB — POCT GLYCOSYLATED HEMOGLOBIN (HGB A1C): HEMOGLOBIN A1C: 5.8

## 2014-10-02 LAB — URIC ACID: Uric Acid, Serum: 10.5 mg/dL — ABNORMAL HIGH (ref 2.4–7.0)

## 2014-10-02 MED ORDER — METOPROLOL TARTRATE 25 MG PO TABS: 25.0000 mg | ORAL_TABLET | Freq: Two times a day (BID) | ORAL | Status: DC

## 2014-10-02 MED ORDER — COLCHICINE 0.6 MG PO TABS: 0.6000 mg | ORAL_TABLET | Freq: Two times a day (BID) | ORAL | Status: DC

## 2014-10-02 MED ORDER — HYDROCODONE-ACETAMINOPHEN 5-325 MG PO TABS: 1.0000 | ORAL_TABLET | Freq: Two times a day (BID) | ORAL | Status: DC | PRN

## 2014-10-02 NOTE — Assessment & Plan Note (Signed)
Improved at last bmet will recheck now on lower dose of lasix and perhaps off all nsaids.

## 2014-10-02 NOTE — Progress Notes (Signed)
   Subjective:    Patient ID: [name], female    DOB: Oct 08, 1951, 63 y.o.   MRN: [id number]  HPI  DIABETES Disease Monitoring: Blood Sugar ranges-not checking Polyuria/phagia/dipsia- no      Visual problems- no Medications: Compliance- twice daily metformin Hypoglycemic symptoms- no  HYPERTENSION Disease Monitoring Home BP Monitoring not checking Chest pain- no    Dyspnea- no Medications Compliance-  As prescribwed. Lightheadedness-  no  Edema- mild unchanged since lowered lasix dose ROS - See HPI  PMH Lab Review   POTASSIUM  Date Value Ref Range Status  09/18/2014 4.7 3.5 - 5.3 mEq/L Final   SODIUM  Date Value Ref Range Status  09/18/2014 142 135 - 145 mEq/L Final   CREAT  Date Value Ref Range Status  09/18/2014 1.59* 0.50 - 1.10 mg/dL Final   CREATININE, SER  Date Value Ref Range Status  07/25/2014 1.17* 0.50 - 1.10 mg/dL Final      Elevated Creatinine Stopped all otc nsaids.  Meloxicam is with her medication bottles she is unsure if she was taking  No irregular heart rate or change in leg swelling  Gout Taking twice daily colchicine although was not on her medication list from recent hospital.   No current joint pain or selliung.  Did have elevated UA in recent labs  Chief Complaint noted Review of Symptoms - see HPI PMH - Smoking status noted.   Vital Signs reviewed     Review of Systems     Objective:   Physical Exam Alert no acute distress Walking with cane 1+ edema at ankles        Assessment & Plan:

## 2014-10-02 NOTE — Assessment & Plan Note (Signed)
Well controlled now. Continue current medications

## 2014-10-02 NOTE — Assessment & Plan Note (Signed)
Seems relatively stable.  Unsure if pain in hospital was due to gout or DJD.  Finish dose of oxycodone and use as needed hydrocodone.

## 2014-10-02 NOTE — Assessment & Plan Note (Signed)
Good A1c.  Will stop metformin with elevated creatinine

## 2014-10-02 NOTE — Patient Instructions (Addendum)
Good to see you today!  Thanks for coming in.  Stop  Ranitidine - can interfere with your other medications  Baclofen - cause you to fall  Meloxicam - hurst your kidneys  Metformin - your diabetes is very well controlled  For Pain finish the Oxycodone 1/3 twice daily as needed then if bad pain take hydrocodone up to twice daily as needed  Bring in all your medication again  I will call you if your lab tests are not normal.  Otherwise we will discuss them at your next visit.

## 2014-10-15 ENCOUNTER — Other Ambulatory Visit: Payer: Self-pay | Admitting: *Deleted

## 2014-10-15 MED ORDER — POTASSIUM CITRATE ER 10 MEQ (1080 MG) PO TBCR: 10.0000 meq | EXTENDED_RELEASE_TABLET | Freq: Two times a day (BID) | ORAL | Status: DC

## 2014-10-21 ENCOUNTER — Encounter: Payer: Self-pay | Admitting: Family Medicine

## 2014-10-21 ENCOUNTER — Ambulatory Visit (INDEPENDENT_AMBULATORY_CARE_PROVIDER_SITE_OTHER): Payer: PRIVATE HEALTH INSURANCE | Admitting: Family Medicine

## 2014-10-21 VITALS — BP 133/86 | HR 71 | Temp 97.7°F | Ht 62.0 in | Wt 213.0 lb

## 2014-10-21 DIAGNOSIS — F32A Depression, unspecified: Secondary | ICD-10-CM

## 2014-10-21 DIAGNOSIS — R748 Abnormal levels of other serum enzymes: Secondary | ICD-10-CM

## 2014-10-21 DIAGNOSIS — R7989 Other specified abnormal findings of blood chemistry: Secondary | ICD-10-CM

## 2014-10-21 DIAGNOSIS — F329 Major depressive disorder, single episode, unspecified: Secondary | ICD-10-CM

## 2014-10-21 DIAGNOSIS — M159 Polyosteoarthritis, unspecified: Secondary | ICD-10-CM

## 2014-10-21 DIAGNOSIS — R197 Diarrhea, unspecified: Secondary | ICD-10-CM | POA: Insufficient documentation

## 2014-10-21 DIAGNOSIS — M150 Primary generalized (osteo)arthritis: Secondary | ICD-10-CM

## 2014-10-21 MED ORDER — FUROSEMIDE 40 MG PO TABS: 40.0000 mg | ORAL_TABLET | Freq: Every day | ORAL | Status: DC

## 2014-10-21 MED ORDER — HYDROCODONE-ACETAMINOPHEN 5-325 MG PO TABS: 1.0000 | ORAL_TABLET | Freq: Two times a day (BID) | ORAL | Status: DC | PRN

## 2014-10-21 MED ORDER — BENAZEPRIL HCL 20 MG PO TABS: 20.0000 mg | ORAL_TABLET | Freq: Every day | ORAL | Status: DC

## 2014-10-21 MED ORDER — GABAPENTIN 100 MG PO CAPS: 200.0000 mg | ORAL_CAPSULE | Freq: Three times a day (TID) | ORAL | Status: DC

## 2014-10-21 MED ORDER — SPIRONOLACTONE 25 MG PO TABS: 25.0000 mg | ORAL_TABLET | Freq: Every morning | ORAL | Status: DC

## 2014-10-21 NOTE — Assessment & Plan Note (Signed)
Stable after her husbands recent death.  Living with daughter.  Will monitor

## 2014-10-21 NOTE — Assessment & Plan Note (Signed)
Stable on current medications without hyperkalemia.  Continue current dosages.  Avoid nsaids

## 2014-10-21 NOTE — Assessment & Plan Note (Signed)
Stable. Using as needed analgesics.  No evidence of acute gout.  Her UA has varied widely over the last few years.  Is currently dieting and losing weight.  Will continue colchicine but hold off starting allopurinol until rechck UA in 3-6 months

## 2014-10-21 NOTE — Assessment & Plan Note (Signed)
New complaint without red flags with recent normal colonoscopy will try stopping her fish oil and see if improves

## 2014-10-21 NOTE — Patient Instructions (Signed)
Good to see you today!  Thanks for coming in.  Stop the fish oil to see if the diarrhea is better.  If you have any blood or severe diarrhea call us   Try to take tyelenol or aspercreme for mild pain and take the hydrocodone only for severe pain .  Come back in 3 months

## 2014-10-21 NOTE — Progress Notes (Signed)
   Subjective:    Patient ID: [name], female    DOB: 03/11/1951, 63 y.o.   MRN: [id number]  HPI  Musculoskeletal  Pain Mostly in knees sometimes in feet and hands.  No hot red swollen joints.  No fever or rash.  Taking colchicine twice daily, gabapentin and as needed tylenol, aspercreme and vicodin.  History of elevated creatinine Taking lasix 40 mg, K tablet and spironolactone.  Weight is down and have not swelling or edema  Lab Results  Component Value Date   CREATININE 1.19* 10/02/2014   Depression Feeling well.  Moving in with her daughter to a new apartment - looking forward to this.  Eating well   Diarrhea Has episode of diarrhea after eats any type of food.  No bleeding or nausea or vomiting or abdomen pain.  Takes fish oil regularly Last colonscopy in 2012  Chief Complaint noted Review of Symptoms - see HPI PMH - Smoking status noted.   Vital Signs reviewed    Review of Systems     Objective:   Physical Exam  Alert no acute distress Heart - Regular rate and rhythm.  No murmurs, gallops or rubs.    Lungs:  Normal respiratory effort, chest expands symmetrically. Lungs are clear to auscultation, no crackles or wheezes. Extrem - trace edema        Assessment & Plan:

## 2014-10-22 ENCOUNTER — Ambulatory Visit (INDEPENDENT_AMBULATORY_CARE_PROVIDER_SITE_OTHER): Payer: Medicare Other

## 2014-10-22 DIAGNOSIS — E1140 Type 2 diabetes mellitus with diabetic neuropathy, unspecified: Secondary | ICD-10-CM

## 2014-10-22 DIAGNOSIS — E1142 Type 2 diabetes mellitus with diabetic polyneuropathy: Secondary | ICD-10-CM

## 2014-10-22 DIAGNOSIS — B351 Tinea unguium: Secondary | ICD-10-CM

## 2014-10-22 DIAGNOSIS — E1169 Type 2 diabetes mellitus with other specified complication: Secondary | ICD-10-CM

## 2014-10-22 DIAGNOSIS — M79673 Pain in unspecified foot: Secondary | ICD-10-CM

## 2014-10-22 DIAGNOSIS — M21969 Unspecified acquired deformity of unspecified lower leg: Secondary | ICD-10-CM

## 2014-10-22 NOTE — Progress Notes (Signed)
   Subjective:    Patient ID: [name], female    DOB: 08-11-1951, 63 y.o.   MRN: [id number]  HPI  pT PRESENTS FOR NAIL DEBRIDEMENT  Review of Systems No new findings or systemic changes noted    Objective:   Physical Exam Neurovascular status intact and unchanged pedal pulses are palpable DP +2 PT plus one over 4 Refill timed 3-4 seconds skin temperature warm to cool turgor diminished there is mild edema noted no rubor pallor mild varicosities noted. Neurologically epicritic sensations diminished on Semmes Weinstein to the forefoot digits and arch there is normal plantar response DTRs not elicited dermatologically skin color pigment normal orthopedic biomechanical exam mild digital contractures HAV deformity and hammertoe deformity bilateral with history of keratoses visiting managed with diabetic shoes needs new diabetic shoes replaced at this time. We'll get authorization for new shoes nails thick brittle crumbly and dystrophic.       Assessment & Plan:  Assessment this time diabetes history peripheral neuropathy and angiopathy. Measurements for diabetic shoes will be carried out once authorization obtained from her diabetes doctor. This time painful mycotic nails with discoloration and brittleness and friability debrided 1 through 5 bilateral no open wounds no ulcers no secondary infections. Authorization for shoes to be obtained  Harriet Masson DPM

## 2014-11-07 ENCOUNTER — Encounter (HOSPITAL_COMMUNITY): Payer: Self-pay | Admitting: Cardiovascular Disease

## 2014-11-08 ENCOUNTER — Other Ambulatory Visit: Payer: Self-pay | Admitting: Family Medicine

## 2014-11-29 DIAGNOSIS — Z923 Personal history of irradiation: Secondary | ICD-10-CM

## 2014-11-29 HISTORY — DX: Personal history of irradiation: Z92.3

## 2014-12-19 ENCOUNTER — Other Ambulatory Visit: Payer: Self-pay

## 2014-12-19 DIAGNOSIS — Z1231 Encounter for screening mammogram for malignant neoplasm of breast: Secondary | ICD-10-CM

## 2014-12-26 ENCOUNTER — Other Ambulatory Visit: Payer: Self-pay | Admitting: Family Medicine

## 2014-12-30 ENCOUNTER — Other Ambulatory Visit: Payer: Self-pay | Admitting: Family Medicine

## 2015-01-13 ENCOUNTER — Ambulatory Visit: Payer: Medicaid Other

## 2015-01-21 ENCOUNTER — Ambulatory Visit (INDEPENDENT_AMBULATORY_CARE_PROVIDER_SITE_OTHER): Payer: Medicare Other

## 2015-01-21 DIAGNOSIS — M79673 Pain in unspecified foot: Secondary | ICD-10-CM | POA: Diagnosis not present

## 2015-01-21 DIAGNOSIS — E1140 Type 2 diabetes mellitus with diabetic neuropathy, unspecified: Secondary | ICD-10-CM

## 2015-01-21 DIAGNOSIS — B351 Tinea unguium: Secondary | ICD-10-CM

## 2015-01-21 DIAGNOSIS — E1142 Type 2 diabetes mellitus with diabetic polyneuropathy: Secondary | ICD-10-CM

## 2015-01-21 NOTE — Progress Notes (Signed)
   Subjective:    Patient ID: [name], female    DOB: [birth year], 64 y.o.   MRN: [id number]  HPI  Pt presents for nail debridement  patient also presents for measurements for new diabetic extra-depth shoes tried athletic type shoe in the future.  Review of Systems no new findings or systemic changes noted     Objective:   Physical Exam Lower extremity objective findings unchanged pedal pulses DP +2 PT 1 over 4 bilateral Refill time 4 seconds all digits there is decreased turgor diminished epicritic sensations confirmed and Ernestina Patches. Mild rubor pallor no varicosities noted. Epicritic and proprioceptive sensations decreased forefoot digits and arch on Semmes Weinstein testing. DTRs not elicited. There is notable HAV deformity and hammertoe deformity bilateral there is history of keratoses managed with diabetic shoes needs replacement shoes at this time measurements for shoes are carried out and bio foam impressions are carried out for custom molded inlays. No open wounds or ulcers are identified nail to the nails this time thick brittle [place] friable dystrophic 1 through 5 bilateral       Assessment & Plan:  Assessment this time his diabetes with history peripheral neuropathy and angiopathy measurements for diabetic shoes carried out at this time and bio foam impressions are done will be contacted when shoes ready for fitting and dispensing debrided friable dystrophic brittle discolored nails 1 through 5 bilateral return for 3 months palliative nail care as recommended are   Harriet Masson DPM

## 2015-01-22 ENCOUNTER — Encounter: Payer: Self-pay | Admitting: Family Medicine

## 2015-01-22 ENCOUNTER — Ambulatory Visit (INDEPENDENT_AMBULATORY_CARE_PROVIDER_SITE_OTHER): Payer: Medicare Other | Admitting: Family Medicine

## 2015-01-22 VITALS — BP 145/77 | HR 66 | Temp 98.3°F | Wt 226.0 lb

## 2015-01-22 DIAGNOSIS — M109 Gout, unspecified: Secondary | ICD-10-CM | POA: Diagnosis not present

## 2015-01-22 DIAGNOSIS — F329 Major depressive disorder, single episode, unspecified: Secondary | ICD-10-CM | POA: Diagnosis not present

## 2015-01-22 DIAGNOSIS — I10 Essential (primary) hypertension: Secondary | ICD-10-CM | POA: Diagnosis not present

## 2015-01-22 DIAGNOSIS — E559 Vitamin D deficiency, unspecified: Secondary | ICD-10-CM | POA: Diagnosis not present

## 2015-01-22 DIAGNOSIS — E1169 Type 2 diabetes mellitus with other specified complication: Secondary | ICD-10-CM

## 2015-01-22 DIAGNOSIS — D519 Vitamin B12 deficiency anemia, unspecified: Secondary | ICD-10-CM | POA: Diagnosis not present

## 2015-01-22 DIAGNOSIS — D510 Vitamin B12 deficiency anemia due to intrinsic factor deficiency: Secondary | ICD-10-CM | POA: Diagnosis not present

## 2015-01-22 DIAGNOSIS — R296 Repeated falls: Secondary | ICD-10-CM | POA: Diagnosis not present

## 2015-01-22 DIAGNOSIS — F32A Depression, unspecified: Secondary | ICD-10-CM

## 2015-01-22 DIAGNOSIS — M21969 Unspecified acquired deformity of unspecified lower leg: Secondary | ICD-10-CM | POA: Diagnosis not present

## 2015-01-22 LAB — CBC
HCT: 34.4 % — ABNORMAL LOW (ref 36.0–46.0)
Hemoglobin: 11.1 g/dL — ABNORMAL LOW (ref 12.0–15.0)
MCH: 29.4 pg (ref 26.0–34.0)
MCHC: 32.3 g/dL (ref 30.0–36.0)
MCV: 91 fL (ref 78.0–100.0)
MPV: 11.2 fL (ref 8.6–12.4)
Platelets: 211 10*3/uL (ref 150–400)
RBC: 3.78 MIL/uL — AB (ref 3.87–5.11)
RDW: 15.4 % (ref 11.5–15.5)
WBC: 8.8 10*3/uL (ref 4.0–10.5)

## 2015-01-22 LAB — VITAMIN B12: Vitamin B-12: 384 pg/mL (ref 211–911)

## 2015-01-22 LAB — COMPREHENSIVE METABOLIC PANEL
ALT: 20 U/L (ref 0–35)
AST: 16 U/L (ref 0–37)
Albumin: 4.2 g/dL (ref 3.5–5.2)
Alkaline Phosphatase: 119 U/L — ABNORMAL HIGH (ref 39–117)
BILIRUBIN TOTAL: 0.6 mg/dL (ref 0.2–1.2)
BUN: 32 mg/dL — AB (ref 6–23)
CALCIUM: 9.5 mg/dL (ref 8.4–10.5)
CO2: 26 mEq/L (ref 19–32)
Chloride: 104 mEq/L (ref 96–112)
Creat: 1.38 mg/dL — ABNORMAL HIGH (ref 0.50–1.10)
Glucose, Bld: 114 mg/dL — ABNORMAL HIGH (ref 70–99)
Potassium: 4.7 mEq/L (ref 3.5–5.3)
Sodium: 141 mEq/L (ref 135–145)
Total Protein: 7.6 g/dL (ref 6.0–8.3)

## 2015-01-22 LAB — POCT GLYCOSYLATED HEMOGLOBIN (HGB A1C): Hemoglobin A1C: 6.3

## 2015-01-22 LAB — TSH: TSH: 1.412 u[IU]/mL (ref 0.350–4.500)

## 2015-01-22 NOTE — Assessment & Plan Note (Signed)
Worsened.  It seems as if she has had intermittent problems with this in past and was part of her hosptilization in the fall.    Many possibilities.  Will check labs now.  Had normal mri in fall.  Does not seem to be an acute focal CNS disease.  If labs unrevealing may need evaluation for possible parkinsonism given history of tremor. Will see back soon

## 2015-01-22 NOTE — Assessment & Plan Note (Signed)
No flares on chronic colchicine.  May discuss adding allopurinol in future

## 2015-01-22 NOTE — Patient Instructions (Addendum)
Good to see you today!  Thanks for coming in.  I will call you if your lab tests are not normal.  Otherwise we will discuss them at your next visit.\  Come back in 2-3 weeks  Work on your strength   I will send your jury letter

## 2015-01-22 NOTE — Progress Notes (Signed)
   Subjective:    Patient ID: [name], female    DOB: [birth year], 64 y.o.   MRN: [id number]  HPI  Gait Instability Patient comes in with her daughter Trinda Pascal.  She has noticed her mother has troubles with balance and walking.  She has nearly fallen several times.  She also seems to have an intermitent tremor.  There has not been a sudden onset or any rapid progression or any focal weakness.  She does walk with a cane   DIABETES Disease Monitoring: Blood Sugar ranges-not checking Polyuria/phagia/dipsia- none      Visual problems- no Medications: Compliance- diet controlled  Hypoglycemic symptoms- no  Gout Taking colchicine regularly.  No gouty flares or focal joint pain or redness  Chief Complaint noted Review of Symptoms - see HPI PMH - Smoking status noted.   Vital Signs reviewed     Review of Systems     Objective:   Physical Exam Alert oriented knows her medications Heart - Regular rate and rhythm.  No murmurs, gallops or rubs.    Lungs:  Normal respiratory effort, chest expands symmetrically. Lungs are clear to auscultation, no crackles or wheezes. Able to get up on exam table without assistance but slowly.  No shortness of breath or chest pain Strength - 5/5 bilaterally PERRL During exam had a coarse resting tremor in R hand for 10 sec or so then resolved        Assessment & Plan:

## 2015-01-22 NOTE — Assessment & Plan Note (Signed)
Well controlled 

## 2015-01-23 ENCOUNTER — Ambulatory Visit
Admission: RE | Admit: 2015-01-23 | Discharge: 2015-01-23 | Disposition: A | Discharge status: Home / Self Care | Payer: Medicare Other | Source: Ambulatory Visit

## 2015-01-23 DIAGNOSIS — Z1231 Encounter for screening mammogram for malignant neoplasm of breast: Secondary | ICD-10-CM

## 2015-01-23 LAB — VITAMIN D 25 HYDROXY (VIT D DEFICIENCY, FRACTURES): VIT D 25 HYDROXY: 7 ng/mL — AB (ref 30–100)

## 2015-01-24 ENCOUNTER — Other Ambulatory Visit: Payer: Self-pay | Admitting: Family Medicine

## 2015-01-24 DIAGNOSIS — R928 Other abnormal and inconclusive findings on diagnostic imaging of breast: Secondary | ICD-10-CM

## 2015-01-25 ENCOUNTER — Other Ambulatory Visit: Payer: Self-pay | Admitting: Family Medicine

## 2015-02-03 ENCOUNTER — Other Ambulatory Visit: Payer: Self-pay | Admitting: Family Medicine

## 2015-02-03 ENCOUNTER — Ambulatory Visit
Admission: RE | Admit: 2015-02-03 | Discharge: 2015-02-03 | Disposition: A | Discharge status: Home / Self Care | Payer: Medicare Other | Source: Ambulatory Visit | Attending: Family Medicine | Admitting: Family Medicine

## 2015-02-03 DIAGNOSIS — R928 Other abnormal and inconclusive findings on diagnostic imaging of breast: Secondary | ICD-10-CM

## 2015-02-03 DIAGNOSIS — N63 Unspecified lump in breast: Secondary | ICD-10-CM | POA: Diagnosis not present

## 2015-02-06 ENCOUNTER — Other Ambulatory Visit: Payer: Self-pay | Admitting: Family Medicine

## 2015-02-06 ENCOUNTER — Encounter: Payer: Self-pay | Admitting: Family Medicine

## 2015-02-06 DIAGNOSIS — R928 Other abnormal and inconclusive findings on diagnostic imaging of breast: Secondary | ICD-10-CM

## 2015-02-06 DIAGNOSIS — E559 Vitamin D deficiency, unspecified: Secondary | ICD-10-CM | POA: Insufficient documentation

## 2015-02-10 ENCOUNTER — Ambulatory Visit
Admission: RE | Admit: 2015-02-10 | Discharge: 2015-02-10 | Disposition: A | Discharge status: Home / Self Care | Payer: Medicare Other | Source: Ambulatory Visit | Attending: Family Medicine | Admitting: Family Medicine

## 2015-02-10 DIAGNOSIS — N63 Unspecified lump in breast: Secondary | ICD-10-CM | POA: Diagnosis not present

## 2015-02-10 DIAGNOSIS — D0511 Intraductal carcinoma in situ of right breast: Secondary | ICD-10-CM | POA: Diagnosis not present

## 2015-02-10 DIAGNOSIS — R928 Other abnormal and inconclusive findings on diagnostic imaging of breast: Secondary | ICD-10-CM

## 2015-02-10 DIAGNOSIS — C50911 Malignant neoplasm of unspecified site of right female breast: Secondary | ICD-10-CM | POA: Diagnosis not present

## 2015-02-10 HISTORY — PX: BREAST BIOPSY: SHX20

## 2015-02-11 ENCOUNTER — Other Ambulatory Visit: Payer: Self-pay | Admitting: Family Medicine

## 2015-02-11 DIAGNOSIS — C50911 Malignant neoplasm of unspecified site of right female breast: Secondary | ICD-10-CM

## 2015-02-12 ENCOUNTER — Ambulatory Visit: Payer: Medicare Other | Admitting: Family Medicine

## 2015-02-12 ENCOUNTER — Ambulatory Visit
Admission: RE | Admit: 2015-02-12 | Discharge: 2015-02-12 | Disposition: A | Discharge status: Home / Self Care | Payer: Medicare Other | Source: Ambulatory Visit | Attending: Family Medicine | Admitting: Family Medicine

## 2015-02-12 DIAGNOSIS — D0511 Intraductal carcinoma in situ of right breast: Secondary | ICD-10-CM | POA: Diagnosis not present

## 2015-02-12 DIAGNOSIS — C50111 Malignant neoplasm of central portion of right female breast: Secondary | ICD-10-CM | POA: Diagnosis not present

## 2015-02-12 DIAGNOSIS — C50911 Malignant neoplasm of unspecified site of right female breast: Secondary | ICD-10-CM

## 2015-02-12 MED ORDER — GADOBENATE DIMEGLUMINE 529 MG/ML IV SOLN
20.0000 mL | Freq: Once | INTRAVENOUS | Status: AC | PRN
  Administered 2015-02-12: 20 mL via INTRAVENOUS

## 2015-02-13 ENCOUNTER — Encounter: Payer: Self-pay | Admitting: *Deleted

## 2015-02-13 ENCOUNTER — Telehealth: Payer: Self-pay | Admitting: *Deleted

## 2015-02-13 ENCOUNTER — Telehealth: Payer: Self-pay | Admitting: Home Health Services

## 2015-02-13 DIAGNOSIS — C50511 Malignant neoplasm of lower-outer quadrant of right female breast: Secondary | ICD-10-CM | POA: Insufficient documentation

## 2015-02-13 HISTORY — DX: Malignant neoplasm of lower-outer quadrant of right female breast: C50.511

## 2015-02-13 NOTE — Telephone Encounter (Signed)
Returned phone call from [place].  Pt reports just finding out she has beginning stages of breast cancer.  She has follow up appointment with oncology on 3/23 and appt with PCP on 4/6.  Pt seems to be in okay mood.  Responds positively to having found cancer early and getting through this.

## 2015-02-13 NOTE — Telephone Encounter (Signed)
Confirmed [facility] for 3/32/16 at 8am .  Instructions and contact information given.

## 2015-02-14 ENCOUNTER — Telehealth: Payer: Self-pay | Admitting: Family Medicine

## 2015-02-14 NOTE — Telephone Encounter (Signed)
Left message.  She should call back if any questions or needs

## 2015-02-18 NOTE — Progress Notes (Addendum)
[facility]  [phone] Fax:([phone]) [place] Note   Patient Care Team: Truitt Merle, MD as PCP - General (Hematology) Harriet Masson, DPM (Podiatry) Gevena Cotton, MD (Ophthalmology) Rana Snare, MD (Urology) Dr. Edwin Cap ([place]) Fanny Skates, MD as Consulting Physician (General Surgery) Thea Silversmith, MD as Consulting Physician (Radiation Oncology) Mauro Kaufmann, RN as Registered Nurse Rockwell Germany, RN as Registered Nurse Holley Bouche, NP as Nurse Practitioner (Nurse Practitioner) 02/20/2015  CHIEF COMPLAINTS/PURPOSE OF CONSULTATION:  Newly diagnosed breast cancer    Breast cancer of lower-outer quadrant of right female breast   02/03/2015 Breast US ultrasound is performed, showing a 5 x 5 x 5 mm irregular hypoechoic mass with internal color vascularity within the right breast 6 o'clock position 4 cm from the nipple   02/10/2015 Pathology Results INVASIVE DUCTAL CARCINOMA. - DUCTAL CARCINOMA IN SITU.   02/10/2015 Receptors her2 Estrogen Receptor: 100%, POSITIVE, STRONG STAINING INTENSITY Progesterone Receptor: 60%, POSITIVE, STRONG STAINING INTENSITY Proliferation Marker Ki67: 3%  HER-2/NEU BY CISH - NEGATIVE   02/12/2015 Breast MRI Biopsy-proven malignancy in the central right breast at posterior depth. 2. No MR findings to suggest multicentric or contralateral malignancy.   02/13/2015 Initial Diagnosis Breast cancer of lower-outer quadrant of right female breast     HISTORY OF PRESENTING ILLNESS:  [name] 64 y.o. female is here because of newly diagnosed breast cancer.  This was discovered by screening mammogtram. Her last screening mammogram in February 2015 was normal.  The screening mammogram on 01/23/2015 showed a possible mass in the right breast. She underwent diagnostic mammogram and ultrasound on 02/03/2015, which showed a 5 x 5 x 5 mm irregular mass in the right breast 6:00 position.  Your  biopsy of the mass showed invasive ductal carcinoma and DCIS.   She has multiple medical problems. She has chronic arthritis, with diffuse join pain, 8/10, she takes tylenol. No chest pain, (+) dyspena on moderate exertion, no nausea, abdominal bloating or change of her bowel habits. She is able to do most of her ADLs, limited light housework, not physically active, spends quite a bit of time sitting during the daytime.  She was hospitalized for UTI in 9/15 and went to rehab for a month. She has been using crane more regularly after that.   MEDICAL HISTORY:  Past Medical History  Diagnosis Date  . Hypertension   . Diabetes mellitus   . Depression   . Asthma   . Eczema   . Arthritis   . Cataract   . GERD (gastroesophageal reflux disease)   . History of kidney stones     w/ hx of hydronephrosis - followed by [facility]  . Anemia   . Obesity   . HIV nonspecific serology     2006: indeterminate HIV blood test, seen by ID, felt secondary to cross reacting antibodies with no further workup felt necessary at that time   . Fatty liver     Fatty infiltration of liver noted on 03/2012 CT scan  . CHF, acute 05/03/2012  . Breast cancer of lower-outer quadrant of right female breast 02/13/2015   GYN HISTORY  Menarchal: 11 LMP: Several years ago Contraceptive: 1979-1986 HRT: NO G3P2:  SURGICAL HISTORY: Past Surgical History  Procedure Laterality Date  . Cystoscopy w/ litholapaxy / ehl    . Cholecystectomy  2003  . Replacement total knee bilateral  2005 &2006  . Joint replacement      bilateral  knee replacement  . Vascular surgery Right 03/15/2013    Ultrasound guided sclerotherapy  . Left and right heart catheterization with coronary angiogram N/A 05/02/2012    Procedure: LEFT AND RIGHT HEART CATHETERIZATION WITH CORONARY ANGIOGRAM;  Surgeon: Burnell Blanks, MD;  [facility] C[facility];  Service: Cardiovascular;  Laterality: N/A;    SOCIAL HISTORY: History   Social History   . Marital Status: Widowed     Spouse Name: [name]  . Number of Children: 3  . Years of Education: 14   Occupational History  . retired-CNA, housekeeping    Social History Main Topics  . Smoking status: Never Smoker   . Smokeless tobacco: Never Used  . Alcohol Use: No  . Drug Use: No  . Sexual Activity: No   Other Topics Concern  . Not on file   Social History Narrative   Health Care POA:    Emergency Contact: son, Elayne Gruver [phone]   End of Life Plan:    Who lives with you:  husband Darrall Dears- Marykatherine is his primary care giver   Any pets: [place]   Diet: Pt has a varied diet.  Is currently working on smaller portions sizes and no late night snacking for weight loss.   Exercise: Pt exercises 1x weekly with church group.   Seatbelts: Pt reports wearing seatbelt when in vehicles.    Hobbies: word searches, church, time with family and friends             FAMILY HISTORY: Family History  Problem Relation Age of Onset  . Diabetes Mother   . Stroke Mother   . Heart disease Father   . Anemia Father   . Cancer Sister 17    nose cancer   . Cancer Cousin 30    ovarian cancer   . Cancer Cousin 49    ovarian cancer     ALLERGIES:  has No Known Allergies.  MEDICATIONS:  Current Outpatient Prescriptions  Medication Sig Dispense Refill  . albuterol (PROVENTIL HFA;VENTOLIN HFA) 108 (90 BASE) MCG/ACT inhaler Inhale 2 puffs into the lungs every 6 (six) hours as needed for wheezing or shortness of breath.    Marland Kitchen aspirin 81 MG tablet Take 81 mg by mouth daily.    . benazepril (LOTENSIN) 20 MG tablet Take 1 tablet (20 mg total) by mouth daily. 90 tablet 3  . colchicine 0.6 MG tablet take 1 tablet by mouth twice a day 60 tablet 3  . FLOVENT HFA 110 MCG/ACT inhaler inhale 2 puffs by mouth twice a day 12 g 6  . furosemide (LASIX) 40 MG tablet Take 1 tablet (40 mg total) by mouth daily. 90 tablet 1  . gabapentin (NEURONTIN) 100 MG capsule Take 2 capsules (200 mg  total) by mouth 3 (three) times daily. 270 capsule 3  . HYDROcodone-acetaminophen (NORCO/VICODIN) 5-325 MG per tablet Take 1 tablet by mouth every 12 (twelve) hours as needed for moderate pain. 60 tablet 0  . metoprolol tartrate (LOPRESSOR) 25 MG tablet Take 1 tablet (25 mg total) by mouth 2 (two) times daily. 60 tablet 11  . potassium citrate (UROCIT-K 10) 10 MEQ (1080 MG) SR tablet Take 1 tablet (10 mEq total) by mouth 2 (two) times daily. 60 tablet 3  . ranitidine (ZANTAC) 150 MG capsule Take 150 mg by mouth every evening.    Marland Kitchen spironolactone (ALDACTONE) 25 MG tablet TAKE 1 TABLET BY MOUTH ONCE A DAY 30 tablet 6  . trolamine salicylate (ASPERCREME)  10 % cream Apply 1 application topically as needed for muscle pain.      No current facility-administered medications for this visit.    REVIEW OF SYSTEMS:   Constitutional: Denies fevers, chills or abnormal night sweats Eyes: Denies blurriness of vision, double vision or watery eyes Ears, nose, mouth, throat, and face: Denies mucositis or sore throat Respiratory: Denies cough, dyspnea or wheezes Cardiovascular: Denies palpitation, chest discomfort or lower extremity swelling Gastrointestinal:  Denies nausea, heartburn or change in bowel habits Skin: Denies abnormal skin rashes Lymphatics: Denies new lymphadenopathy or easy bruising Neurological:Denies numbness, tingling or new weaknesses Behavioral/Psych: Mood is stable, no new changes  All other systems were reviewed with the patient and are negative.  PHYSICAL EXAMINATION: ECOG PERFORMANCE STATUS: 2 - Symptomatic, <50% confined to bed  Filed Vitals:   02/19/15 0859  BP: 135/69  Pulse: 58  Temp: 97.6 F (36.4 C)  Resp: 18   Filed Weights   02/19/15 0859  Weight: 227 lb 8 oz (103.193 kg)    GENERAL:alert, no distress and comfortable SKIN: skin color, texture, turgor are normal, no rashes or significant lesions EYES: normal, conjunctiva are pink and non-injected, sclera  clear OROPHARYNX:no exudate, no erythema and lips, buccal mucosa, and tongue normal  NECK: supple, thyroid normal size, non-tender, without nodularity LYMPH:  no palpable lymphadenopathy in the cervical, axillary or inguinal LUNGS: clear to auscultation and percussion with normal breathing effort HEART: regular rate & rhythm and no murmurs and no lower extremity edema ABDOMEN:abdomen soft, non-tender and normal bowel sounds Musculoskeletal:no cyanosis of digits and no clubbing  PSYCH: alert & oriented x 3 with fluent speech NEURO: no focal motor/sensory deficits Breasts: Breast inspection showed them to be symmetrical with no nipple discharge. Palpation of the breasts feels lumpy, but no discrete mass. Palpitation of axilla revealed no obvious mass that I could appreciate.   LABORATORY DATA:  I have reviewed the data as listed Lab Results  Component Value Date   WBC 9.1 02/19/2015   HGB 10.8* 02/19/2015   HCT 33.7* 02/19/2015   MCV 93.6 02/19/2015   PLT 176 02/19/2015    Recent Labs  07/23/14 0806 07/24/14 0700 07/25/14 0655  09/11/14 0944 09/18/14 1018 10/02/14 1019 01/22/15 1128 02/19/15 0831  NA 135* 139 139  --  141 142 139 141 142  K 4.6 4.2 4.7  --  5.0 4.7 4.5 4.7 4.6  CL 96 100 103  --  103 106 102 104  --   CO2 _0 --  _1 GLUCOSE 133* 174* 118*  --  138* 120* 99 114* 131  BUN 29* 28* 19  --  58* 39* 16 32* 33.6*  CREATININE 1.31* 1.20* 1.17*  < > 1.90* 1.59* 1.19* 1.38* 1.3*  CALCIUM 9.0 9.0 8.8  --  9.4  9.4 9.3 9.5 9.5 9.2  GFRNONAA 42* 46* 48*  --   --   --   --   --   --   GFRAA 48* 54* 55*  --   --   --   --   --   --   PROT 7.4 7.1 7.1  --  7.3  --   --  7.6 7.2  ALBUMIN 2.0* 2.0* 2.0*  --  3.8  --   --  4.2 3.7  AST _2 --  14  --   --  16 15  ALT _3 --  10  --   --  20 21  ALKPHOS 157* 164* 139*  --  73  --   --  119* 139  BILITOT 1.3* 0.7 0.4  --  0.5  --   --  0.6 0.42  < > = values in this interval not  displayed.  Pathology 02/10/2015 Diagnosis Breast, right, needle core biopsy, left outer quadrant - INVASIVE DUCTAL CARCINOMA. - DUCTAL CARCINOMA IN SITU. - SEE COMMENT. Results: IMMUNOHISTOCHEMICAL AND MORPHOMETRIC ANALYSIS BY THE AUTOMATED CELLULAR IMAGING SYSTEM (ACIS) Estrogen Receptor: 100%, POSITIVE, STRONG STAINING INTENSITY Progesterone Receptor: 60%, POSITIVE, STRONG STAINING INTENSITY   HER-2/NEU BY CISH - NEGATIVE. RESULT RATIO OF HER2: CEP 17 SIGNALS 1.13 AVERAGE HER2 COPY NUMBER PER CELL 1.70   RADIOGRAPHIC STUDIES: I have personally reviewed the radiological images as listed and agreed with the findings in the report.  Mr Breast Bilateral W Wo Contrast 02/13/2015    FINDINGS: Breast composition: Scattered fibroglandular densities.  Background parenchymal enhancement: The multiplanar, multi sequence bilateral breast MR demonstrates moderate background parenchymal enhancement with scattered foci of non mass enhancement bilaterally with benign MR features.  Right breast: A focus of metallic clip artifact is identified in the central right breast at posterior depth denoting site of biopsy-proven disease with mild associated post biopsy sequela. At the site of biopsy, examination demonstrates a small round homogeneously enhancing mass with irregular margins measuring approximately 6 x 6 x 6 mm consistent with a residual disease. No additional suspicious mass or enhancement to suggest multifocal or multicentric disease.  Left breast: Scattered foci of non mass enhancement with benign MR features. No abnormal mass, suspicious enhancement, or morphologic abnormality to suggest contralateral disease.  Lymph nodes: The axillary and internal mammary lymph node chains are symmetric and unremarkable.  Ancillary findings:  None.   IMPRESSION: 1. Biopsy-proven malignancy in the central right breast at posterior depth. 2. No MR findings to suggest multicentric or contralateral malignancy. 3. No  evidence of adenopathy.  RECOMMENDATION: Continued surgical management.  BI-RADS CATEGORY  6: Known biopsy-proven malignancy.   Electronically Signed   By: Andres Shad   On: 02/13/2015 10:44     02/03/2015 US breast  Targeted ultrasound is performed, showing a 5 x 5 x 5 mm irregular hypoechoic mass with internal color vascularity within the right breast 6 o'clock position 4 cm from the nipple. No right axillary lymphadenopathy.  IMPRESSION: Suspicious right breast mass.  01/23/2015  Mammogram  FINDINGS: In the right breast, a possible mass warrants further evaluation with spot compression views and possibly ultrasound. In the left breast, no findings suspicious for malignancy. Images were processed with CAD.  IMPRESSION: Further evaluation is suggested for possible mass in the right breast.  RECOMMENDATION: Diagnostic mammogram and possibly ultrasound of the right breast.   ASSESSMENT & PLAN:  64 year old postmenopausal African-American American female, screening detected right breast invasive ductal carcinoma and DCIS.  1. cT1b N0 M0, stage IA right breast invasive ductal carcinoma, ER positive PR positive HER-2 negative, Ki 67 3%, and DCIS -I discussed her imaging findings and biopsy pathology results with her and her daughters in great details. -She has early-stage breast cancer, will likely have lumpectomy first. She was seen by Dr. Dalbert Batman today. -Giving the small size of the tumor, low grade, and her multiple comorbidities, I do not think she needs adjuvant chemotherapy, unless her final surgical pathology staging changes. -She would benefit from adjuvant endocrine therapy, with a aromatase inhibitor or tamoxifen. Given her significant arthritis, she may not tolerate aromatase inhibitor well and tamoxifen  would be a reasonable option. -She was also seen by radiation oncologist [name] worse today. -I'll plan to see her back 2-3 weeks after her surgery to review her  final surgical pathology results, and finalize her adjuvant hormonal therapy.  2. Hypertension, diabetes, arthritis, CHF -She will continue follow-up with her primary care physician  3. Arthritis and immobilization -I think she would benefit from home physical therapy, so she will tolerate breast cancer treatment better. We'll refer her to home care  All questions were answered. The patient knows to call the clinic with any problems, questions or concerns. I spent 40 minutes counseling the patient face to face. The total time spent in the appointment was 55 minutes and more than 50% was on counseling.     Truitt Merle, MD 02/20/2015 12:55 PM

## 2015-02-19 ENCOUNTER — Encounter: Payer: Self-pay | Admitting: Physical Therapy

## 2015-02-19 ENCOUNTER — Ambulatory Visit
Admission: RE | Admit: 2015-02-19 | Discharge: 2015-02-19 | Disposition: A | Discharge status: Home / Self Care | Payer: Medicare Other | Source: Ambulatory Visit | Attending: Radiation Oncology | Admitting: Radiation Oncology

## 2015-02-19 ENCOUNTER — Encounter: Payer: Self-pay | Admitting: Hematology

## 2015-02-19 ENCOUNTER — Ambulatory Visit: Payer: Medicare Other | Attending: General Surgery | Admitting: Physical Therapy

## 2015-02-19 ENCOUNTER — Other Ambulatory Visit: Payer: Self-pay | Admitting: General Surgery

## 2015-02-19 ENCOUNTER — Encounter: Payer: Self-pay | Admitting: Skilled Nursing Facility1

## 2015-02-19 ENCOUNTER — Encounter: Payer: Self-pay | Admitting: General Practice

## 2015-02-19 ENCOUNTER — Ambulatory Visit: Payer: Medicare Other | Admitting: Family Medicine

## 2015-02-19 ENCOUNTER — Other Ambulatory Visit (HOSPITAL_BASED_OUTPATIENT_CLINIC_OR_DEPARTMENT_OTHER): Payer: Medicare Other

## 2015-02-19 ENCOUNTER — Ambulatory Visit: Payer: Medicare Other

## 2015-02-19 ENCOUNTER — Ambulatory Visit (HOSPITAL_BASED_OUTPATIENT_CLINIC_OR_DEPARTMENT_OTHER): Payer: Medicare Other | Admitting: Hematology

## 2015-02-19 VITALS — BP 135/69 | HR 58 | Temp 97.6°F | Resp 18 | Ht 62.0 in | Wt 227.5 lb

## 2015-02-19 DIAGNOSIS — M25612 Stiffness of left shoulder, not elsewhere classified: Secondary | ICD-10-CM

## 2015-02-19 DIAGNOSIS — E119 Type 2 diabetes mellitus without complications: Secondary | ICD-10-CM | POA: Diagnosis not present

## 2015-02-19 DIAGNOSIS — M1990 Unspecified osteoarthritis, unspecified site: Secondary | ICD-10-CM | POA: Diagnosis not present

## 2015-02-19 DIAGNOSIS — C50511 Malignant neoplasm of lower-outer quadrant of right female breast: Secondary | ICD-10-CM | POA: Diagnosis not present

## 2015-02-19 DIAGNOSIS — M7582 Other shoulder lesions, left shoulder: Secondary | ICD-10-CM | POA: Insufficient documentation

## 2015-02-19 DIAGNOSIS — I509 Heart failure, unspecified: Secondary | ICD-10-CM | POA: Diagnosis not present

## 2015-02-19 DIAGNOSIS — C50111 Malignant neoplasm of central portion of right female breast: Secondary | ICD-10-CM

## 2015-02-19 DIAGNOSIS — C50412 Malignant neoplasm of upper-outer quadrant of left female breast: Secondary | ICD-10-CM | POA: Diagnosis not present

## 2015-02-19 DIAGNOSIS — Z9181 History of falling: Secondary | ICD-10-CM | POA: Diagnosis not present

## 2015-02-19 DIAGNOSIS — I10 Essential (primary) hypertension: Secondary | ICD-10-CM

## 2015-02-19 DIAGNOSIS — R531 Weakness: Secondary | ICD-10-CM | POA: Diagnosis not present

## 2015-02-19 DIAGNOSIS — C50911 Malignant neoplasm of unspecified site of right female breast: Secondary | ICD-10-CM

## 2015-02-19 DIAGNOSIS — M21969 Unspecified acquired deformity of unspecified lower leg: Secondary | ICD-10-CM | POA: Diagnosis not present

## 2015-02-19 DIAGNOSIS — C50912 Malignant neoplasm of unspecified site of left female breast: Secondary | ICD-10-CM | POA: Diagnosis not present

## 2015-02-19 DIAGNOSIS — N183 Chronic kidney disease, stage 3 (moderate): Secondary | ICD-10-CM | POA: Diagnosis not present

## 2015-02-19 DIAGNOSIS — E1169 Type 2 diabetes mellitus with other specified complication: Secondary | ICD-10-CM | POA: Diagnosis not present

## 2015-02-19 LAB — CBC WITH DIFFERENTIAL/PLATELET
BASO%: 0.4 % (ref 0.0–2.0)
BASOS ABS: 0 10*3/uL (ref 0.0–0.1)
EOS%: 1.8 % (ref 0.0–7.0)
Eosinophils Absolute: 0.2 10*3/uL (ref 0.0–0.5)
HCT: 33.7 % — ABNORMAL LOW (ref 34.8–46.6)
HEMOGLOBIN: 10.8 g/dL — AB (ref 11.6–15.9)
LYMPH#: 4.2 10*3/uL — AB (ref 0.9–3.3)
LYMPH%: 45.6 % (ref 14.0–49.7)
MCH: 30 pg (ref 25.1–34.0)
MCHC: 32 g/dL (ref 31.5–36.0)
MCV: 93.6 fL (ref 79.5–101.0)
MONO#: 0.8 10*3/uL (ref 0.1–0.9)
MONO%: 8.5 % (ref 0.0–14.0)
NEUT%: 43.7 % (ref 38.4–76.8)
NEUTROS ABS: 4 10*3/uL (ref 1.5–6.5)
Platelets: 176 10*3/uL (ref 145–400)
RBC: 3.6 10*6/uL — ABNORMAL LOW (ref 3.70–5.45)
RDW: 14.7 % — ABNORMAL HIGH (ref 11.2–14.5)
WBC: 9.1 10*3/uL (ref 3.9–10.3)
nRBC: 0 % (ref 0–0)

## 2015-02-19 LAB — COMPREHENSIVE METABOLIC PANEL (CC13)
ALBUMIN: 3.7 g/dL (ref 3.5–5.0)
ALT: 21 U/L (ref 0–55)
ANION GAP: 9 meq/L (ref 3–11)
AST: 15 U/L (ref 5–34)
Alkaline Phosphatase: 139 U/L (ref 40–150)
BUN: 33.6 mg/dL — AB (ref 7.0–26.0)
CALCIUM: 9.2 mg/dL (ref 8.4–10.4)
CO2: 24 mEq/L (ref 22–29)
Chloride: 108 mEq/L (ref 98–109)
Creatinine: 1.3 mg/dL — ABNORMAL HIGH (ref 0.6–1.1)
EGFR: 51 mL/min/{1.73_m2} — AB (ref >90)
GLUCOSE: 131 mg/dL (ref 70–140)
POTASSIUM: 4.6 meq/L (ref 3.5–5.1)
Sodium: 142 mEq/L (ref 136–145)
Total Bilirubin: 0.42 mg/dL (ref 0.20–1.20)
Total Protein: 7.2 g/dL (ref 6.4–8.3)

## 2015-02-19 NOTE — Progress Notes (Signed)
[facility] Psychosocial Distress Screening Spiritual Care  Spiritual Care received distress screen from [facility] per distress screening protocol.  The patient scored a 1 on the Psychosocial Distress Thermometer which indicates mild distress.   ONCBCN DISTRESS SCREENING 02/19/2015  Screening Type Initial Screening  Distress experienced in past week (1-10) 0  Emotional problem type Adjusting to illness  Spiritual/Religous concerns type Relating to God  Physical Problem type Pain;Tingling hands/feet;Swollen arms/legs  Referral to support programs Yes    Follow up needed: No.  Pt indicates distress level of "0."  A packet of print materials about Support Center team/resources and contact info was provided.  Phoned pt to offer support/information and to encourage her to reach out anytime.  [place], [place]

## 2015-02-19 NOTE — Patient Instructions (Signed)

## 2015-02-19 NOTE — Progress Notes (Signed)
Subjective:     Patient ID: [name], female   DOB: Feb 11, 1951, 64 y.o.   MRN: [id number]  HPI   Review of Systems     Objective:   Physical Exam For the patient to understand and be given the tools to implement a healthy plant based diet during their cancer diagnosis.     Assessment:     Patient was seen today and found to be dejected/quiet and was accompanied by seemingly supportive yet boistrous daughters. Pts right breast is afflicted. Pts daughters would talk over the pt and be strict with her but it was from a caring place. Current/relevant medication: furosemide. Pt also has gout, stage 3 kidney disease, and diabetes. Pts weight: 01/22/15: 226 pounds 3/16: 217 pounds 3/23: 227 pounds BMI 41.7. Pt was doing silver sneakers but then her husband died so she did not feel like going anymore. She has been upset but has recently begun to want to be more mobile. Pt states she can walk to the bus station but she worries about having to use the restroom because she is on the lasix.  She does have uncontrolled diabetes that has caused some damage to her feet but that does not deter her mobility. Pts daughters state she eats a lot of fruit and when asked if fruit has carbohydrates/sugar in them the family and the pt said no.     Plan:     Dietitian educated the patient on implementing a plant based diet by incorporating more plant proteins, fruits, and vegetables. As a part of a healthy routine physical activity was discussed. A folder of evidence based information with a focus on a plant based diet and general nutrition during cancer was given to the patient.  The importance of legitimate, evidence based information was discussed and examples were given. As a part of the continuum of care the cancer dietitian's contact information was given to the patient in the event they would like to have a follow up appointment. Dietitian suggested inquiring at her local [employer] about starting silver  sneakers again and looking into possible transportation options and also the senior services/senior center so she can be social with people her own age and work on crafts she enjoys. Dietitian advised she try to decrease the amount of fruit she consumes and when she does to add a protein with it.

## 2015-02-19 NOTE — Progress Notes (Signed)
  Radiation Oncology         [phone]) [phone] ________________________________  Initial outpatient Consultation - Date: 2/87/8676   Name: [name] MRN: [id number]   DOB: Oct 25, 1951  REFERRING PHYSICIAN: Fanny Skates, MD  DIAGNOSIS:    ICD-9-CM ICD-10-CM   1. Breast cancer of lower-outer quadrant of right female breast 174.5 C50.511     STAGE: T1bN0 Invasive Ductal Carcinoma of the right breast  HISTORY OF PRESENT ILLNESS::[name] is a 64 y.o. female  Who presented for a screening mammogram and was found to have a right breast mass. This measured 13m and 6 mm on the MRI.  No nodes were seen on MRI or mammogram.  She did well with her biopsy which revealed a Grade 1 invasive ductal carcinoma which was ER/PR+ HER2- and Ki 67 was 3%. She is present with her 2 daughters.  She is GxP2 with her first birth at [birth year] She is post menopausal and not on HRT. She has a family history of head and neck and brain cancer.   PREVIOUS RADIATION THERAPY: [place] Past medical, social and family history were reviewed in the electronic chart. Review of symptoms was reviewed in the electronic chart. Medications were reviewed in the electronic chart.   PHYSICAL EXAM: There were no vitals filed for this visit.. . Pleasant female. No distress. Appears older than stated age. Quiet. Keloids on chest. No palpable abnormalities of the right or left breast. No palpable axillary, cervical or supraclavicular adenopathy.   IMPRESSION: T1N0 Invasive Ductal Carcinoma of the Right Breast.  PLAN: I spoke to the patient today regarding her diagnosis and options for treatment. We discussed the equivalence in terms of survival and local failure between mastectomy and breast conservation. We discussed the role of radiation in decreasing local failures in patients who undergo lumpectomy. We discussed the process of simulation and the placement tattoos. We  discussed 4-6 weeks of treatment as an outpatient. We discussed the possibility of asymptomatic lung damage. We discussed the low likelihood of secondary malignancies. We discussed the possible side effects including but not limited to skin redness, fatigue, permanent skin darkening, and breast swelling. We discussed the process of simulation and the placement of tattoos. She met with medical oncology as well as a member of our patient family support team and our physical therapist. I will plan on seeing her back after her surgery.   I spent 40 minutes face to face with the patient and more than 50% of that time was spent in counseling and/or coordination of care.  ------------------------------------------------  SThea Silversmith MD

## 2015-02-19 NOTE — Progress Notes (Signed)
Checked in new pt with no financial concerns at this time.  Pt has 2 insurances so financial assistance may not be needed but she has Raquel's card for any billing questions or concerns.  ° °

## 2015-02-19 NOTE — Progress Notes (Signed)
Ms. Lemus is a very pleasant 64 y.o. female from [place], [place] with newly diagnosed grade 1 invasive ductal carcinoma and DCIS of the right breast.  Biopsy results revealed the tumor's hormone status as ER positive, PR positive, and HER2/neu negative. Ki67 is 3%.  She presents today with her 2 daughters to the [facility]) for treatment consideration and recommendations from the breast surgeon, radiation oncologist, and medical oncologist.     I briefly met with Ms. Mackowski and her family during her [facility] visit today. We discussed the purpose of the Survivorship Clinic, which will include monitoring for recurrence, coordinating completion of age and gender-appropriate cancer screenings, promotion of overall wellness, as well as managing potential late/long-term side effects of anti-cancer treatments.    The treatment plan for Ms. Barcelona will likely include surgery, radiation therapy, and anti-estrogen therapy.  As of today, the intent of treatment for Ms. Weyandt is cure, therefore she will be eligible for the Survivorship Clinic upon her completion of treatment.  Her survivorship care plan (SCP) document will be drafted and updated throughout the course of her treatment trajectory. She will receive the SCP in an office visit with myself in the Survivorship Clinic once she has completed treatment.   Ms. Oconnor was encouraged to ask questions and all questions were answered to her satisfaction.  She was given my business card and encouraged to contact me with any concerns regarding survivorship.  I look forward to participating in her care.   Mike Craze, NP [place] [phone]

## 2015-02-19 NOTE — Therapy (Signed)
[place], [place], [address] Phone: [phone]   Fax:  [phone]  Physical Therapy Evaluation  Patient Details  Name: [name] MRN: [id number] Date of Birth: 12-29-1950 Referring Provider:  Fanny Skates, MD  Encounter Date: 02/19/2015      PT End of Session - 02/19/15 1344    Visit Number 1   Number of Visits 1   PT Start Time 1016   PT Stop Time 1026  and from 1050-1110   PT Time Calculation (min) 10 min   Activity Tolerance Patient tolerated treatment well   Behavior During Therapy [facility] for tasks assessed/performed      Past Medical History  Diagnosis Date  . Hypertension   . Diabetes mellitus   . Depression   . Asthma   . Eczema   . Arthritis   . Cataract   . GERD (gastroesophageal reflux disease)   . History of kidney stones     w/ hx of hydronephrosis - followed by [facility]  . Anemia   . Obesity   . HIV nonspecific serology     2006: indeterminate HIV blood test, seen by ID, felt secondary to cross reacting antibodies with no further workup felt necessary at that time   . Fatty liver     Fatty infiltration of liver noted on 03/2012 CT scan  . CHF, acute 05/03/2012  . Breast cancer of lower-outer quadrant of right female breast 02/13/2015    Past Surgical History  Procedure Laterality Date  . Cystoscopy w/ litholapaxy / ehl    . Cholecystectomy  2003  . Replacement total knee bilateral  2005 &2006  . Joint replacement      bilateral knee replacement  . Vascular surgery Right 03/15/2013    Ultrasound guided sclerotherapy  . Left and right heart catheterization with coronary angiogram N/A 05/02/2012    Procedure: LEFT AND RIGHT HEART CATHETERIZATION WITH CORONARY ANGIOGRAM;  Surgeon: Burnell Blanks, MD;  Location: [facility] CATH LAB;  Service: Cardiovascular;  Laterality: N/A;    There were no vitals filed for this visit.  Visit Diagnosis:  Generalized weakness  - Plan: PT plan of care cert/re-cert  Risk for falls - Plan: PT plan of care cert/re-cert  Decreased range of motion of left shoulder - Plan: PT plan of care cert/re-cert      Subjective Assessment - 02/19/15 1334    Symptoms Patient was seen today for a baseline assessment of her newly diagnosed right breast cancer.   Pertinent History Diagnosed 02/10/15 with right ER/PR positive, HER2 negative breast cancer followed by radiation and anti-estrogen therapy.   Patient Stated Goals Learn post op shoulder ROM HEP and lymphedema risk reduction practices.   Currently in Pain? Yes   Pain Score 6    Pain Location Generalized  "All over".  She reports pain throughout her body due to osteoarthritis.   Pain Orientation Other (Comment)   Pain Descriptors / Indicators Aching   Pain Type Chronic pain   Pain Onset More than a month ago   Pain Frequency Constant   Aggravating Factors  unknown   Pain Relieving Factors unknown            [facility] PT Assessment - 02/19/15 0001    Assessment   Medical Diagnosis Right breast cancer   Onset Date 02/10/15   Precautions   Precautions Other (comment);Fall  Active breast cancer   Restrictions   Weight Bearing Restrictions No   Balance  Screen   Has the patient fallen in the past 6 months Yes   How many times? 3   Has the patient had a decrease in activity level because of a fear of falling?  Yes   Is the patient reluctant to leave their home because of a fear of falling?  Yes   [place] Private residence   Living Arrangements Children   Available Help at Discharge Family   Prior Function   Level of [place] device for independence  Ambulates with single point cane   Vocation Retired;On disability   Leisure She does not exercise   Cognition   Overall Cognitive Status Within Functional Limits for tasks assessed   Posture/Postural Control   Posture/Postural Control Postural limitations    Postural Limitations Rounded Shoulders;Forward head;Flexed trunk   ROM / Strength   AROM / PROM / Strength AROM;Strength   AROM   AROM Assessment Site Shoulder   Right/Left Shoulder Right;Left   Right Shoulder Extension 50 Degrees   Right Shoulder Flexion 148 Degrees   Right Shoulder ABduction 140 Degrees   Right Shoulder Internal Rotation 54 Degrees   Right Shoulder External Rotation 74 Degrees   Left Shoulder Extension 40 Degrees   Left Shoulder Flexion 78 Degrees   Left Shoulder ABduction 55 Degrees   Strength   Overall Strength Within functional limits for tasks performed           LYMPHEDEMA/ONCOLOGY QUESTIONNAIRE - 02/19/15 1341    Type   Cancer Type Right breast   Lymphedema Assessments   Lymphedema Assessments Upper extremities   Right Upper Extremity Lymphedema   10 cm Proximal to Olecranon Process 32.4 cm   Olecranon Process 27.4 cm   10 cm Proximal to Ulnar Styloid Process 23 cm   Just Proximal to Ulnar Styloid Process 19.8 cm   Across Hand at [employer] 20 cm   At [place] of 2nd Digit 7.6 cm   Left Upper Extremity Lymphedema   10 cm Proximal to Olecranon Process 29.6 cm   Olecranon Process 25.5 cm   10 cm Proximal to Ulnar Styloid Process 21.8 cm   Just Proximal to Ulnar Styloid Process 18.5 cm   Across Hand at [employer] 20.3 cm   At [place] of 2nd Digit 7.7 cm       Patient was instructed today in a home exercise program today for post op shoulder range of motion. These included active assist shoulder flexion in sitting, scapular retraction, wall walking with shoulder abduction, and hands behind head external rotation.  She was encouraged to do these twice a day, holding 3 seconds and repeating 5 times when permitted by her physician.         PT Education - 02/19/15 1343    Education provided Yes   Education Details Post op shoulder ROM HEP and lymphedema risk reduction   Person(s) Educated Patient   Methods Explanation;Demonstration;Handout    Comprehension Verbalized understanding              Breast Clinic Goals - 02/19/15 1518    Patient will be able to verbalize understanding of pertinent lymphedema risk reduction practices relevant to her diagnosis specifically related to skin care.   Time 1   Period Days   Status Achieved   Patient will be able to return demonstrate and/or verbalize understanding of the post-op home exercise program related to regaining shoulder range of motion.   Time 1  Period Days   Status Achieved   Patient will be able to verbalize understanding of the importance of attending the postoperative After Breast Cancer Class for further lymphedema risk reduction education and therapeutic exercise.   Time 1   Period Days   Status Achieved              Plan - 16-Mar-2015 1354    Clinical Impression Statement Patient is a 64 year old woman recently diagnosed with right ER/PR positive, HER2 negative breast cancer.  She is planning to have a right lumpectomy with a sentinel node biopsy followed by radiation and anti-estrogen therapy.  She will benefit from home health therapy at this time due to fall risk and overall weakness along with significant left shoulder dysfunction.     Pt will benefit from skilled therapeutic intervention in order to improve on the following deficits Decreased range of motion;Increased edema;Pain;Impaired UE functional use;Decreased strength;Decreased knowledge of precautions   Rehab Potential Good   Clinical Impairments Affecting Rehab Potential Poor mobility and generally deconditioned   PT Frequency One time visit   PT Treatment/Interventions Patient/family education;Therapeutic exercise   Consulted and Agree with Plan of Care Patient;Family member/caregiver   Family Member Consulted Daughters     Patient will follow up at outpatient cancer rehab if needed following surgery.  If the patient requires physical therapy at that time, a specific plan will be dictated and  sent to the referring physician for approval. The patient was educated today on appropriate basic range of motion exercises to begin post operatively and the importance of attending the After Breast Cancer class following surgery.  Patient was educated today on lymphedema risk reduction practices as it pertains to recommendations that will benefit the patient immediately following surgery.  She verbalized good understanding.  No additional physical therapy is indicated at this time.         G-Codes - Mar 16, 2015 1520    Functional Assessment Tool Used CLinical Judgement   Functional Limitation Other PT primary   Other PT Primary Current Status [phone]) At least 40 percent but less than 60 percent impaired, limited or restricted   Other PT Primary Goal Status (J6283) At least 40 percent but less than 60 percent impaired, limited or restricted   Other PT Primary Discharge Status (T5176) At least 40 percent but less than 60 percent impaired, limited or restricted       Problem List Patient Active Problem List   Diagnosis Date Noted  . Breast cancer of lower-outer quadrant of right female breast 02/13/2015  . Vitamin D deficiency 02/06/2015  . Falls frequently 01/22/2015  . Acute gouty arthritis 07/26/2014  . Type 2 diabetes mellitus with diabetic foot deformity 01/01/2014  . Systolic CHF, chronic 16/05/3709  . OBESITY 02/05/2009  . ASTHMA, PERSISTENT, MILD 02/08/2008  . Depression 05/18/2007  . HYPERTENSION, BENIGN SYSTEMIC 01/26/2007  . NEPHROLITHIASIS 01/26/2007  . DJD (degenerative joint disease), multiple sites 01/26/2007    [facility], PT Mar 16, 2015, 3:23 PM  [place], [place], [address] Phone: [phone]   Fax:  [phone]

## 2015-02-20 ENCOUNTER — Encounter: Payer: Self-pay | Admitting: Hematology

## 2015-02-20 ENCOUNTER — Telehealth: Payer: Self-pay | Admitting: *Deleted

## 2015-02-20 ENCOUNTER — Other Ambulatory Visit: Payer: Self-pay | Admitting: *Deleted

## 2015-02-20 DIAGNOSIS — C50511 Malignant neoplasm of lower-outer quadrant of right female breast: Secondary | ICD-10-CM

## 2015-02-20 NOTE — Telephone Encounter (Signed)
Called [name], transition to home specialist @ [facility], and left message re:  New referral has been made for pt to be evaluated by nursing for medication management; PT for assisting with safe ambulation. [name]   Cell  Phone    [phone].

## 2015-02-21 DIAGNOSIS — F329 Major depressive disorder, single episode, unspecified: Secondary | ICD-10-CM | POA: Diagnosis not present

## 2015-02-21 DIAGNOSIS — Z9181 History of falling: Secondary | ICD-10-CM | POA: Diagnosis not present

## 2015-02-21 DIAGNOSIS — E669 Obesity, unspecified: Secondary | ICD-10-CM | POA: Diagnosis not present

## 2015-02-21 DIAGNOSIS — J45909 Unspecified asthma, uncomplicated: Secondary | ICD-10-CM | POA: Diagnosis not present

## 2015-02-21 DIAGNOSIS — I10 Essential (primary) hypertension: Secondary | ICD-10-CM | POA: Diagnosis not present

## 2015-02-21 DIAGNOSIS — C50511 Malignant neoplasm of lower-outer quadrant of right female breast: Secondary | ICD-10-CM | POA: Diagnosis not present

## 2015-02-21 DIAGNOSIS — I509 Heart failure, unspecified: Secondary | ICD-10-CM | POA: Diagnosis not present

## 2015-02-21 DIAGNOSIS — E119 Type 2 diabetes mellitus without complications: Secondary | ICD-10-CM | POA: Diagnosis not present

## 2015-02-24 ENCOUNTER — Other Ambulatory Visit: Payer: Self-pay | Admitting: Family Medicine

## 2015-02-25 ENCOUNTER — Telehealth: Payer: Self-pay | Admitting: *Deleted

## 2015-02-25 DIAGNOSIS — I10 Essential (primary) hypertension: Secondary | ICD-10-CM | POA: Diagnosis not present

## 2015-02-25 DIAGNOSIS — F329 Major depressive disorder, single episode, unspecified: Secondary | ICD-10-CM | POA: Diagnosis not present

## 2015-02-25 DIAGNOSIS — E669 Obesity, unspecified: Secondary | ICD-10-CM | POA: Diagnosis not present

## 2015-02-25 DIAGNOSIS — C50511 Malignant neoplasm of lower-outer quadrant of right female breast: Secondary | ICD-10-CM | POA: Diagnosis not present

## 2015-02-25 DIAGNOSIS — I509 Heart failure, unspecified: Secondary | ICD-10-CM | POA: Diagnosis not present

## 2015-02-25 DIAGNOSIS — E119 Type 2 diabetes mellitus without complications: Secondary | ICD-10-CM | POA: Diagnosis not present

## 2015-02-25 DIAGNOSIS — Z9181 History of falling: Secondary | ICD-10-CM | POA: Diagnosis not present

## 2015-02-25 DIAGNOSIS — J45909 Unspecified asthma, uncomplicated: Secondary | ICD-10-CM | POA: Diagnosis not present

## 2015-02-25 NOTE — Telephone Encounter (Signed)
Spoke to pt concerning [place] from 02/19/15. Denies questions or concerns regarding dx or treatment care plan. Encourage pt to call with needs. Received verbal understanding. Contact information given.

## 2015-02-27 DIAGNOSIS — C50511 Malignant neoplasm of lower-outer quadrant of right female breast: Secondary | ICD-10-CM | POA: Diagnosis not present

## 2015-02-27 DIAGNOSIS — R0902 Hypoxemia: Secondary | ICD-10-CM | POA: Diagnosis not present

## 2015-02-27 DIAGNOSIS — I509 Heart failure, unspecified: Secondary | ICD-10-CM | POA: Diagnosis not present

## 2015-02-27 DIAGNOSIS — R0602 Shortness of breath: Secondary | ICD-10-CM | POA: Diagnosis not present

## 2015-02-27 DIAGNOSIS — F329 Major depressive disorder, single episode, unspecified: Secondary | ICD-10-CM | POA: Diagnosis not present

## 2015-02-27 DIAGNOSIS — I10 Essential (primary) hypertension: Secondary | ICD-10-CM | POA: Diagnosis not present

## 2015-02-27 DIAGNOSIS — Z9181 History of falling: Secondary | ICD-10-CM | POA: Diagnosis not present

## 2015-02-27 DIAGNOSIS — E119 Type 2 diabetes mellitus without complications: Secondary | ICD-10-CM | POA: Diagnosis not present

## 2015-02-27 DIAGNOSIS — E669 Obesity, unspecified: Secondary | ICD-10-CM | POA: Diagnosis not present

## 2015-02-27 DIAGNOSIS — J45909 Unspecified asthma, uncomplicated: Secondary | ICD-10-CM | POA: Diagnosis not present

## 2015-02-28 DIAGNOSIS — J45909 Unspecified asthma, uncomplicated: Secondary | ICD-10-CM | POA: Diagnosis not present

## 2015-02-28 DIAGNOSIS — I509 Heart failure, unspecified: Secondary | ICD-10-CM | POA: Diagnosis not present

## 2015-02-28 DIAGNOSIS — E669 Obesity, unspecified: Secondary | ICD-10-CM | POA: Diagnosis not present

## 2015-02-28 DIAGNOSIS — C50511 Malignant neoplasm of lower-outer quadrant of right female breast: Secondary | ICD-10-CM | POA: Diagnosis not present

## 2015-02-28 DIAGNOSIS — E119 Type 2 diabetes mellitus without complications: Secondary | ICD-10-CM | POA: Diagnosis not present

## 2015-02-28 DIAGNOSIS — F329 Major depressive disorder, single episode, unspecified: Secondary | ICD-10-CM | POA: Diagnosis not present

## 2015-02-28 DIAGNOSIS — I10 Essential (primary) hypertension: Secondary | ICD-10-CM | POA: Diagnosis not present

## 2015-02-28 DIAGNOSIS — Z9181 History of falling: Secondary | ICD-10-CM | POA: Diagnosis not present

## 2015-03-04 DIAGNOSIS — C50511 Malignant neoplasm of lower-outer quadrant of right female breast: Secondary | ICD-10-CM | POA: Diagnosis not present

## 2015-03-04 DIAGNOSIS — F329 Major depressive disorder, single episode, unspecified: Secondary | ICD-10-CM | POA: Diagnosis not present

## 2015-03-04 DIAGNOSIS — I509 Heart failure, unspecified: Secondary | ICD-10-CM | POA: Diagnosis not present

## 2015-03-04 DIAGNOSIS — J45909 Unspecified asthma, uncomplicated: Secondary | ICD-10-CM | POA: Diagnosis not present

## 2015-03-04 DIAGNOSIS — I10 Essential (primary) hypertension: Secondary | ICD-10-CM | POA: Diagnosis not present

## 2015-03-04 DIAGNOSIS — E669 Obesity, unspecified: Secondary | ICD-10-CM | POA: Diagnosis not present

## 2015-03-04 DIAGNOSIS — Z9181 History of falling: Secondary | ICD-10-CM | POA: Diagnosis not present

## 2015-03-04 DIAGNOSIS — E119 Type 2 diabetes mellitus without complications: Secondary | ICD-10-CM | POA: Diagnosis not present

## 2015-03-05 ENCOUNTER — Encounter: Payer: Self-pay | Admitting: Family Medicine

## 2015-03-05 ENCOUNTER — Ambulatory Visit (INDEPENDENT_AMBULATORY_CARE_PROVIDER_SITE_OTHER): Payer: Medicare Other | Admitting: Family Medicine

## 2015-03-05 VITALS — BP 122/66 | HR 58 | Temp 97.8°F | Ht 62.0 in | Wt 230.4 lb

## 2015-03-05 DIAGNOSIS — M150 Primary generalized (osteo)arthritis: Secondary | ICD-10-CM

## 2015-03-05 DIAGNOSIS — I5022 Chronic systolic (congestive) heart failure: Secondary | ICD-10-CM

## 2015-03-05 DIAGNOSIS — R296 Repeated falls: Secondary | ICD-10-CM

## 2015-03-05 DIAGNOSIS — M159 Polyosteoarthritis, unspecified: Secondary | ICD-10-CM

## 2015-03-05 MED ORDER — HYDROCODONE-ACETAMINOPHEN 5-325 MG PO TABS: 1.0000 | ORAL_TABLET | Freq: Every day | ORAL | Status: DC | PRN

## 2015-03-05 NOTE — Patient Instructions (Signed)
Good to see you today!  Thanks for coming in.  I think you are fine for surgery  Bring in all your medications next visit  Use tyelonol and aspercreme first for the pain.  If not helping enough you can take an occasional vicoden but be careful with your balance  Come back 2 months  In June

## 2015-03-05 NOTE — Assessment & Plan Note (Signed)
Stable.  Recent lab work up unrevealing.  No tremor today.  Getting home Physical Therapy.  Cautioned to be very careful with vicoden use for pain

## 2015-03-05 NOTE — Assessment & Plan Note (Signed)
Stable on current medications.  No signs of angina and ej fraction has recovered.  Stable to proceed with breast surgery.

## 2015-03-05 NOTE — Assessment & Plan Note (Signed)
Exacerbation although todays pain seems more consistent with sciatica than DJD of hip.  Continue combination analgesics.

## 2015-03-05 NOTE — Progress Notes (Signed)
   Subjective:    Patient ID: [name], female    DOB: 1951/08/04, 64 y.o.   MRN: [id number]  HPI  Falls - has not had any further falls.  No dizzyness  Systolic CHF Stable.  No increased shortness of breath or chest pain.  No leg swelling  HYPERTENSION Disease Monitoring Home BP Monitoring not checking Chest pain- no    Dyspnea- no Medications Compliance-  Did not bring medications but knows them. Lightheadedness-  no  Edema- no ROS - See HPI  DJD Having more back pain the last day.  No trauma.  Has chronic pain in knees.   Taking tylenol and occasional narcotic.  No redness or weakness  PMH Lab Review   POTASSIUM  Date Value Ref Range Status  02/19/2015 4.6 3.5 - 5.1 mEq/L Final  01/22/2015 4.7 3.5 - 5.3 mEq/L Final   SODIUM  Date Value Ref Range Status  02/19/2015 142 136 - 145 mEq/L Final  01/22/2015 141 135 - 145 mEq/L Final   CREATININE  Date Value Ref Range Status  02/19/2015 1.3* 0.6 - 1.1 mg/dL Final   CREAT  Date Value Ref Range Status  01/22/2015 1.38* 0.50 - 1.10 mg/dL Final   CREATININE, SER  Date Value Ref Range Status  07/25/2014 1.17* 0.50 - 1.10 mg/dL Final       Chief Complaint noted Review of Symptoms - see HPI PMH - Smoking status noted.   Vital Signs reviewed     Review of Systems     Objective:   Physical Exam  Alert no acute distress Walks with cane but limps with pain in left sciatic area Hips - good range of motion without pain Heart - Regular rate and rhythm.  No murmurs, gallops or rubs.    Lungs:  Normal respiratory effort, chest expands symmetrically. Lungs are clear to auscultation, no crackles or wheezes. Extremities:  No cyanosis, edema,      Assessment & Plan:

## 2015-03-06 ENCOUNTER — Encounter: Payer: Self-pay | Admitting: Family Medicine

## 2015-03-06 DIAGNOSIS — E119 Type 2 diabetes mellitus without complications: Secondary | ICD-10-CM | POA: Diagnosis not present

## 2015-03-06 DIAGNOSIS — Z9181 History of falling: Secondary | ICD-10-CM | POA: Diagnosis not present

## 2015-03-06 DIAGNOSIS — I10 Essential (primary) hypertension: Secondary | ICD-10-CM | POA: Diagnosis not present

## 2015-03-06 DIAGNOSIS — I509 Heart failure, unspecified: Secondary | ICD-10-CM | POA: Diagnosis not present

## 2015-03-06 DIAGNOSIS — J45909 Unspecified asthma, uncomplicated: Secondary | ICD-10-CM | POA: Diagnosis not present

## 2015-03-06 DIAGNOSIS — C50511 Malignant neoplasm of lower-outer quadrant of right female breast: Secondary | ICD-10-CM | POA: Diagnosis not present

## 2015-03-06 DIAGNOSIS — F329 Major depressive disorder, single episode, unspecified: Secondary | ICD-10-CM | POA: Diagnosis not present

## 2015-03-06 DIAGNOSIS — E669 Obesity, unspecified: Secondary | ICD-10-CM | POA: Diagnosis not present

## 2015-03-07 ENCOUNTER — Other Ambulatory Visit: Payer: Self-pay | Admitting: General Surgery

## 2015-03-07 DIAGNOSIS — C50911 Malignant neoplasm of unspecified site of right female breast: Secondary | ICD-10-CM

## 2015-03-10 ENCOUNTER — Telehealth: Payer: Self-pay | Admitting: *Deleted

## 2015-03-10 NOTE — Telephone Encounter (Signed)
Scheduled and confirmed post-op appt with [name] on 5/6 at 1000. Referral for xrt placed.

## 2015-03-11 ENCOUNTER — Telehealth: Payer: Self-pay | Admitting: *Deleted

## 2015-03-11 DIAGNOSIS — J45909 Unspecified asthma, uncomplicated: Secondary | ICD-10-CM | POA: Diagnosis not present

## 2015-03-11 DIAGNOSIS — F329 Major depressive disorder, single episode, unspecified: Secondary | ICD-10-CM | POA: Diagnosis not present

## 2015-03-11 DIAGNOSIS — Z9181 History of falling: Secondary | ICD-10-CM | POA: Diagnosis not present

## 2015-03-11 DIAGNOSIS — I10 Essential (primary) hypertension: Secondary | ICD-10-CM | POA: Diagnosis not present

## 2015-03-11 DIAGNOSIS — E669 Obesity, unspecified: Secondary | ICD-10-CM | POA: Diagnosis not present

## 2015-03-11 DIAGNOSIS — E119 Type 2 diabetes mellitus without complications: Secondary | ICD-10-CM | POA: Diagnosis not present

## 2015-03-11 DIAGNOSIS — I509 Heart failure, unspecified: Secondary | ICD-10-CM | POA: Diagnosis not present

## 2015-03-11 DIAGNOSIS — C50511 Malignant neoplasm of lower-outer quadrant of right female breast: Secondary | ICD-10-CM | POA: Diagnosis not present

## 2015-03-11 NOTE — Telephone Encounter (Signed)
Receive a message from [place], [place] with [facility] stating pt is in a lot of pain.  The pain medication prescribed by PCP was not working.  Derl Barrow, RN

## 2015-03-11 NOTE — Telephone Encounter (Signed)
Pt called stating she is still having left side hip pain.  The pain is not relieved by the hydrocodone.  Pt stated she was taking Ibuprofen before the hydrocodone, but since she started with hydrocodone she stopped the Ibuprofen.  Please advise.  Derl Barrow, RN

## 2015-03-11 NOTE — Telephone Encounter (Signed)
Spoke with her.  No falls or trauma. Taking one hydrocodone bid.  Advised to take this with a tylenol also and use aspercreme Avoid ibuprofen Call in not imprving in 2 days LC

## 2015-03-11 NOTE — Telephone Encounter (Signed)
Left voice message for pt to return nurse call regarding pain.  Need a little more information. Will forward to PCP.  Derl Barrow, RN

## 2015-03-13 DIAGNOSIS — Z9181 History of falling: Secondary | ICD-10-CM | POA: Diagnosis not present

## 2015-03-13 DIAGNOSIS — F329 Major depressive disorder, single episode, unspecified: Secondary | ICD-10-CM | POA: Diagnosis not present

## 2015-03-13 DIAGNOSIS — I509 Heart failure, unspecified: Secondary | ICD-10-CM | POA: Diagnosis not present

## 2015-03-13 DIAGNOSIS — C50511 Malignant neoplasm of lower-outer quadrant of right female breast: Secondary | ICD-10-CM | POA: Diagnosis not present

## 2015-03-13 DIAGNOSIS — E669 Obesity, unspecified: Secondary | ICD-10-CM | POA: Diagnosis not present

## 2015-03-13 DIAGNOSIS — J45909 Unspecified asthma, uncomplicated: Secondary | ICD-10-CM | POA: Diagnosis not present

## 2015-03-13 DIAGNOSIS — E119 Type 2 diabetes mellitus without complications: Secondary | ICD-10-CM | POA: Diagnosis not present

## 2015-03-13 DIAGNOSIS — I10 Essential (primary) hypertension: Secondary | ICD-10-CM | POA: Diagnosis not present

## 2015-03-17 ENCOUNTER — Ambulatory Visit
Admission: RE | Admit: 2015-03-17 | Discharge: 2015-03-17 | Disposition: A | Discharge status: Home / Self Care | Payer: Medicare Other | Source: Ambulatory Visit | Attending: General Surgery | Admitting: General Surgery

## 2015-03-17 DIAGNOSIS — C50911 Malignant neoplasm of unspecified site of right female breast: Secondary | ICD-10-CM

## 2015-03-18 ENCOUNTER — Encounter (HOSPITAL_BASED_OUTPATIENT_CLINIC_OR_DEPARTMENT_OTHER): Payer: Self-pay | Admitting: *Deleted

## 2015-03-18 ENCOUNTER — Ambulatory Visit
Admission: RE | Admit: 2015-03-18 | Discharge: 2015-03-18 | Disposition: A | Discharge status: Home / Self Care | Payer: Medicare Other | Source: Ambulatory Visit | Attending: General Surgery | Admitting: General Surgery

## 2015-03-18 ENCOUNTER — Ambulatory Visit
Admission: RE | Admit: 2015-03-18 | Discharge: 2015-03-18 | Disposition: A | Discharge status: Home / Self Care | Payer: Medicare Other | Source: Ambulatory Visit

## 2015-03-18 ENCOUNTER — Ambulatory Visit (HOSPITAL_COMMUNITY): Admission: RE | Admit: 2015-03-18 | Payer: Medicare Other | Source: Ambulatory Visit

## 2015-03-18 NOTE — Progress Notes (Signed)
Talked with pt and daughter-to go for cxr-labs and ekg done-

## 2015-03-18 NOTE — Progress Notes (Signed)
   03/18/15 0939  OBSTRUCTIVE SLEEP APNEA  Have you ever been diagnosed with sleep apnea through a sleep study? No  Do you snore loudly (loud enough to be heard through closed doors)?  1  Do you often feel tired, fatigued, or sleepy during the daytime? 0  Has anyone observed you stop breathing during your sleep? 0  Do you have, or are you being treated for high blood pressure? 1  BMI more than 35 kg/m2? 1  Age over 66 years old? 1  Gender: 0

## 2015-03-19 DIAGNOSIS — J45909 Unspecified asthma, uncomplicated: Secondary | ICD-10-CM | POA: Diagnosis not present

## 2015-03-19 DIAGNOSIS — E669 Obesity, unspecified: Secondary | ICD-10-CM | POA: Diagnosis not present

## 2015-03-19 DIAGNOSIS — E119 Type 2 diabetes mellitus without complications: Secondary | ICD-10-CM | POA: Diagnosis not present

## 2015-03-19 DIAGNOSIS — C50511 Malignant neoplasm of lower-outer quadrant of right female breast: Secondary | ICD-10-CM | POA: Diagnosis not present

## 2015-03-19 DIAGNOSIS — I10 Essential (primary) hypertension: Secondary | ICD-10-CM | POA: Diagnosis not present

## 2015-03-19 DIAGNOSIS — I509 Heart failure, unspecified: Secondary | ICD-10-CM | POA: Diagnosis not present

## 2015-03-19 DIAGNOSIS — F329 Major depressive disorder, single episode, unspecified: Secondary | ICD-10-CM | POA: Diagnosis not present

## 2015-03-19 DIAGNOSIS — Z9181 History of falling: Secondary | ICD-10-CM | POA: Diagnosis not present

## 2015-03-20 ENCOUNTER — Other Ambulatory Visit: Payer: Self-pay | Admitting: General Surgery

## 2015-03-20 ENCOUNTER — Ambulatory Visit
Admission: RE | Admit: 2015-03-20 | Discharge: 2015-03-20 | Disposition: A | Discharge status: Home / Self Care | Payer: Medicare Other | Source: Ambulatory Visit | Attending: General Surgery | Admitting: General Surgery

## 2015-03-20 DIAGNOSIS — J45909 Unspecified asthma, uncomplicated: Secondary | ICD-10-CM | POA: Diagnosis not present

## 2015-03-20 DIAGNOSIS — Z01818 Encounter for other preprocedural examination: Secondary | ICD-10-CM

## 2015-03-20 DIAGNOSIS — I509 Heart failure, unspecified: Secondary | ICD-10-CM | POA: Diagnosis not present

## 2015-03-20 NOTE — H&P (Signed)
[name]  Location: [facility] Patient #: [id number] DOB: 1951/03/31 Undefined / Language: Undefined / Race: Undefined Female       History of Present Illness The patient is a 64 year old female who presents with breast cancer. She is referred by [name] at the breast center of [facility] for management of a clinical stage I cancer of the right breast at the 6:00 position. [name] is her PCP at the [facility] family practice Center. She is being evaluated in the [facility] today by [name], [name], and me. The patient gets annual mammograms. She has never had a breast problem in the past. Recent mammograms and ultrasound show a 6 mm lobulated mass in the right breast at the 6:00 position, posterior depth. The right axilla looks normal on ultrasound. Image guided biopsy shows mixed invasive and in situ ductal carcinoma. Hormone receptor positive. HER-2 negative. MRI shows a small cancer in the central right breast that is solitary. No adenopathy. Left breast is normal. She has significant comorbidities. Diabetes with diabetic foot deformity, walks with a cane. Hypertension, depression, acute gouty arthritis, asthma, anemia, history of systolic congestive heart failure, compensated, obesity, chronic kidney disease stage III, history bilateral total knee replacement, history laparoscopic cholecystectomy. History keloid formation. Family history is negative for breast cancer. 2 paternal cousins had some type of gynecologic cancer possibly ovarian. Mother died of diabetes and stroke. Father died of coronary artery disease.  She is here with HER-2 daughters. She is a widow. She is retired. Used to do housekeeping work. Denies tobacco. We talked about lumpectomy, mastectomy, sentinel node biopsy, radiation therapy, antiestrogen therapy. We all agreed that we would proceed with right partial mastectomy with radioactive seed localization and right axillary sentinel node  biopsy. I discussed the indications, details, techniques, and numerous risk of the surgery with the patient and her daughters. They're aware of the risk of bleeding, infection, further surgery for positive margins or positive nodes, cosmetic deformity, chronic pain due to nerve damage, and other unforeseen problems. She understands all these issues well. All of her questions are answered. She agrees with this plan. We will need medical clearance for general anesthesia from [name], her PCP.   Other Problems Arthritis Asthma Breast Cancer Cholelithiasis Congestive Heart Failure Diabetes Mellitus Hemorrhoids High blood pressure Kidney Stone Lump In Breast Migraine Headache Pulmonary Embolism / Blood Clot in Legs Transfusion history Vascular Disease  Past Surgical History  Breast Biopsy Right. Gallbladder Surgery - Laparoscopic Knee Surgery Bilateral.  Diagnostic Studies History  Colonoscopy 1-5 years ago Mammogram within last year Pap Smear >5 years ago  Social History Alcohol use Remotely quit alcohol use. Caffeine use Carbonated beverages, Tea. No drug use Tobacco use Never smoker.  Family History  Arthritis Brother, Family Members In [place], Sister. Cancer Family Members In General. Diabetes Mellitus Mother, Sister. Heart Disease Father. Hypertension Brother, Family Members In [place], Mother, Sister. Ovarian Cancer Family Members In General.  Pregnancy / Birth History Age at menarche 56 years. Age of menopause 51-55 Contraceptive History Intrauterine device, Oral contraceptives. Gravida 2 Irregular periods Maternal age 18-20 Para 2  Review of Systems General Not Present- Appetite Loss, Chills, Fatigue, Fever, Night Sweats, Weight Gain and Weight Loss. Skin Present- Rash. Not Present- Change in Wart/Mole, Dryness, Hives, Jaundice, New Lesions, Non-Healing Wounds and Ulcer. HEENT Present- Wears glasses/contact  lenses. Not Present- Earache, Hearing Loss, Hoarseness, Nose Bleed, Oral Ulcers, Ringing in the Ears, Seasonal Allergies, Sinus Pain, Sore Throat, Visual  Disturbances and Yellow Eyes. Respiratory Present- Snoring. Not Present- Bloody sputum, Chronic Cough, Difficulty Breathing and Wheezing. Breast Not Present- Breast Mass, Breast Pain, Nipple Discharge and Skin Changes. Cardiovascular Not Present- Chest Pain, Difficulty Breathing Lying Down, Leg Cramps, Palpitations, Rapid Heart Rate, Shortness of Breath and Swelling of Extremities. Gastrointestinal Present- Chronic diarrhea. Not Present- Abdominal Pain, Bloating, Bloody Stool, Change in Bowel Habits, Constipation, Difficulty Swallowing, Excessive gas, Gets full quickly at meals, Hemorrhoids, Indigestion, Nausea, Rectal Pain and Vomiting. Female Genitourinary Not Present- Frequency, Nocturia, Painful Urination, Pelvic Pain and Urgency. Musculoskeletal Present- Joint Pain, Joint Stiffness and Swelling of Extremities. Not Present- Back Pain, Muscle Pain and Muscle Weakness. Neurological Present- Trouble walking. Not Present- Decreased Memory, Fainting, Headaches, Numbness, Seizures, Tingling, Tremor and Weakness. Psychiatric Not Present- Anxiety, Bipolar, Change in Sleep Pattern, Depression, Fearful and Frequent crying. Endocrine Present- Cold Intolerance and Hair Changes. Not Present- Excessive Hunger, Heat Intolerance, Hot flashes and New Diabetes. Hematology Not Present- Easy Bruising, Excessive bleeding, Gland problems, HIV and Persistent Infections.   Physical Exam  General Mental Status-Alert. General Appearance-Consistent with stated age. Hydration-Well hydrated. Voice-Normal. Note: Ambulate slowly with a cane. Needs a little assistance getting up on exam table   Integumentary Note: Keloid formation upper presternal area, epigastrium from cholecystectomy.   Head and Neck Head-normocephalic, atraumatic with no lesions or  palpable masses. Trachea-midline. Thyroid Gland Characteristics - normal size and consistency.  Eye Eyeball - Bilateral-Extraocular movements intact. Sclera/Conjunctiva - Bilateral-No scleral icterus.  Chest and Lung Exam Chest and lung exam reveals -quiet, even and easy respiratory effort with no use of accessory muscles and on auscultation, normal breath sounds, no adventitious sounds and normal vocal resonance. Inspection Chest Wall - Normal. Back - normal.  Breast Breast - Left-Symmetric, Non Tender, No Biopsy scars, no Dimpling, No Inflammation, No Lumpectomy scars, No Mastectomy scars, No Peau d' Orange. Breast - Right-Symmetric and Biopsy scar, Non Tender, No Dimpling, No Inflammation, No Lumpectomy scars, No Mastectomy scars, No Peau d' Orange. Breast Lump-No Palpable Breast Mass. Note: Breasts are moderately large. No palpable mass. No hematoma. Small puncture wound right breast from biopsy. No axillary adenopathy.   Cardiovascular Cardiovascular examination reveals -normal heart sounds, regular rate and rhythm with no murmurs and normal pedal pulses bilaterally.  Abdomen Inspection Inspection of the abdomen reveals - No Hernias. Skin - Scar - no surgical scars. Palpation/Percussion Palpation and Percussion of the abdomen reveal - Soft, Non Tender, No Rebound tenderness, No Rigidity (guarding) and No hepatosplenomegaly. Auscultation Auscultation of the abdomen reveals - Bowel sounds normal.  Neurologic Neurologic evaluation reveals -alert and oriented x 3 with no impairment of recent or remote memory. Mental Status-Normal.  Neuropsychiatric Note: Very flattened affect. Consistent with depression. Oriented and cooperative.   Musculoskeletal Normal Exam - Left-Upper Extremity Strength Normal and Lower Extremity Strength Normal. Normal Exam - Right-Upper Extremity Strength Normal and Lower Extremity Strength Normal.  Lymphatic Head &  Neck  General Head & Neck Lymphatics: Bilateral - Description - Normal. Axillary  General Axillary Region: Bilateral - Description - Normal. Tenderness - Non Tender. Femoral & Inguinal  Generalized Femoral & Inguinal Lymphatics: Bilateral - Description - Normal. Tenderness - Non Tender.    Assessment & Plan  PRIMARY CANCER OF LEFT FEMALE BREAST (174.9  C50.912) Current Plans  Schedule for Surgery:  You have a small, stage I invasive cancer of your right breast in the lower central portion. You are a good candidate for breast conservation surgery, and you have agreed to proceed  with this surgery as the next step Dr. Darrel Hoover office will call you tomorrow to schedule right partial mastectomy with radioactive seed localization, and right axillary sentinel lymph node biopsy. We will need medical clearance from [name], your primary care physician We have discussed the indications, techniques, and numerous risk of the surgery in detail with you and your daughters. After you have healed from the surgery, [name] and [name] will decide about radiation therapy and anti-estrogen therapy.  CKD (CHRONIC KIDNEY DISEASE), STAGE III (585.3  N18.3)  TYPE 2 DIABETES MELLITUS WITH DIABETIC FOOT DEFORMITY (250.80  E11.69)  HYPERTENSION, BENIGN (401.1  I10)  DEPRESSION, CONTROLLED (311  F32.9)  ASTHMA IN ADULT, UNSPECIFIED ASTHMA SEVERITY, UNCOMPLICATED (257.49  T55.217)  OBESITY (BMI 30-39.9) (278.00  G71.5)  CHRONIC SYSTOLIC CHF (CONGESTIVE HEART FAILURE), NYHA CLASS 1 (428.22  I50.22) Impression: followed by PCP  HISTORY OF TOTAL KNEE REPLACEMENT, BILATERAL (V43.65  Z96.653)  HISTORY OF LAPAROSCOPIC CHOLECYSTECTOMY (V45.89  Z90.49)  H/O KELOID OF SKIN (V13.3  Z87.2)    Sherrol Vicars M. Dalbert Smith, M.D., [facility] General and Minimally invasive Surgery Breast and Colorectal Surgery Office:   [phone] Pager:   [phone]

## 2015-03-21 ENCOUNTER — Encounter (HOSPITAL_BASED_OUTPATIENT_CLINIC_OR_DEPARTMENT_OTHER)
Admission: RE | Disposition: A | Discharge status: Home / Self Care | Payer: Self-pay | Source: Ambulatory Visit | Attending: General Surgery

## 2015-03-21 ENCOUNTER — Ambulatory Visit (HOSPITAL_BASED_OUTPATIENT_CLINIC_OR_DEPARTMENT_OTHER)
Admission: RE | Admit: 2015-03-21 | Discharge: 2015-03-21 | Disposition: A | Discharge status: Home / Self Care | Payer: Medicare Other | Source: Ambulatory Visit | Attending: General Surgery | Admitting: General Surgery

## 2015-03-21 ENCOUNTER — Ambulatory Visit (HOSPITAL_BASED_OUTPATIENT_CLINIC_OR_DEPARTMENT_OTHER): Payer: Medicare Other | Admitting: Anesthesiology

## 2015-03-21 ENCOUNTER — Encounter (HOSPITAL_COMMUNITY)
Admission: RE | Admit: 2015-03-21 | Discharge: 2015-03-21 | Disposition: A | Discharge status: Home / Self Care | Payer: Medicare Other | Source: Ambulatory Visit | Attending: General Surgery | Admitting: General Surgery

## 2015-03-21 ENCOUNTER — Ambulatory Visit
Admission: RE | Admit: 2015-03-21 | Discharge: 2015-03-21 | Disposition: A | Discharge status: Home / Self Care | Payer: Medicare Other | Source: Ambulatory Visit | Attending: General Surgery | Admitting: General Surgery

## 2015-03-21 ENCOUNTER — Encounter (HOSPITAL_BASED_OUTPATIENT_CLINIC_OR_DEPARTMENT_OTHER): Payer: Self-pay | Admitting: *Deleted

## 2015-03-21 DIAGNOSIS — J45909 Unspecified asthma, uncomplicated: Secondary | ICD-10-CM | POA: Insufficient documentation

## 2015-03-21 DIAGNOSIS — Z170 Estrogen receptor positive status [ER+]: Secondary | ICD-10-CM | POA: Diagnosis not present

## 2015-03-21 DIAGNOSIS — M21969 Unspecified acquired deformity of unspecified lower leg: Secondary | ICD-10-CM | POA: Insufficient documentation

## 2015-03-21 DIAGNOSIS — N183 Chronic kidney disease, stage 3 (moderate): Secondary | ICD-10-CM | POA: Diagnosis not present

## 2015-03-21 DIAGNOSIS — G8918 Other acute postprocedural pain: Secondary | ICD-10-CM | POA: Diagnosis not present

## 2015-03-21 DIAGNOSIS — Z9049 Acquired absence of other specified parts of digestive tract: Secondary | ICD-10-CM | POA: Insufficient documentation

## 2015-03-21 DIAGNOSIS — G43909 Migraine, unspecified, not intractable, without status migrainosus: Secondary | ICD-10-CM | POA: Insufficient documentation

## 2015-03-21 DIAGNOSIS — Z6841 Body Mass Index (BMI) 40.0 and over, adult: Secondary | ICD-10-CM | POA: Diagnosis not present

## 2015-03-21 DIAGNOSIS — E1169 Type 2 diabetes mellitus with other specified complication: Secondary | ICD-10-CM | POA: Insufficient documentation

## 2015-03-21 DIAGNOSIS — E669 Obesity, unspecified: Secondary | ICD-10-CM | POA: Insufficient documentation

## 2015-03-21 DIAGNOSIS — C50911 Malignant neoplasm of unspecified site of right female breast: Secondary | ICD-10-CM

## 2015-03-21 DIAGNOSIS — Z86711 Personal history of pulmonary embolism: Secondary | ICD-10-CM | POA: Diagnosis not present

## 2015-03-21 DIAGNOSIS — C50511 Malignant neoplasm of lower-outer quadrant of right female breast: Secondary | ICD-10-CM | POA: Diagnosis present

## 2015-03-21 DIAGNOSIS — D0511 Intraductal carcinoma in situ of right breast: Secondary | ICD-10-CM | POA: Insufficient documentation

## 2015-03-21 DIAGNOSIS — Z96653 Presence of artificial knee joint, bilateral: Secondary | ICD-10-CM | POA: Insufficient documentation

## 2015-03-21 DIAGNOSIS — Z9011 Acquired absence of right breast and nipple: Secondary | ICD-10-CM | POA: Diagnosis not present

## 2015-03-21 DIAGNOSIS — Z01818 Encounter for other preprocedural examination: Secondary | ICD-10-CM

## 2015-03-21 DIAGNOSIS — I5022 Chronic systolic (congestive) heart failure: Secondary | ICD-10-CM | POA: Diagnosis not present

## 2015-03-21 DIAGNOSIS — R079 Chest pain, unspecified: Secondary | ICD-10-CM | POA: Diagnosis not present

## 2015-03-21 DIAGNOSIS — Z872 Personal history of diseases of the skin and subcutaneous tissue: Secondary | ICD-10-CM | POA: Insufficient documentation

## 2015-03-21 DIAGNOSIS — I129 Hypertensive chronic kidney disease with stage 1 through stage 4 chronic kidney disease, or unspecified chronic kidney disease: Secondary | ICD-10-CM | POA: Diagnosis not present

## 2015-03-21 HISTORY — PX: BREAST LUMPECTOMY: SHX2

## 2015-03-21 HISTORY — PX: RADIOACTIVE SEED GUIDED PARTIAL MASTECTOMY WITH AXILLARY SENTINEL LYMPH NODE BIOPSY: SHX6520

## 2015-03-21 SURGERY — RADIOACTIVE SEED GUIDED PARTIAL MASTECTOMY WITH AXILLARY SENTINEL LYMPH NODE BIOPSY
Anesthesia: General | Site: Breast | Laterality: Right

## 2015-03-21 MED ORDER — HYDROMORPHONE HCL 1 MG/ML IJ SOLN
INTRAMUSCULAR | Status: AC
  Filled 2015-03-21: qty 1

## 2015-03-21 MED ORDER — EPHEDRINE SULFATE 50 MG/ML IJ SOLN
INTRAMUSCULAR | Status: DC | PRN
  Administered 2015-03-21: 10 mg via INTRAVENOUS

## 2015-03-21 MED ORDER — MIDAZOLAM HCL 2 MG/2ML IJ SOLN
INTRAMUSCULAR | Status: AC
  Filled 2015-03-21: qty 2

## 2015-03-21 MED ORDER — CHLORHEXIDINE GLUCONATE 4 % EX LIQD: 1.0000 "application " | Freq: Once | CUTANEOUS | Status: DC

## 2015-03-21 MED ORDER — BUPIVACAINE-EPINEPHRINE (PF) 0.5% -1:200000 IJ SOLN
INTRAMUSCULAR | Status: DC | PRN
  Administered 2015-03-21: 2 mL via PERINEURAL

## 2015-03-21 MED ORDER — MEPERIDINE HCL 25 MG/ML IJ SOLN: 6.2500 mg | INTRAMUSCULAR | Status: DC | PRN

## 2015-03-21 MED ORDER — FENTANYL CITRATE (PF) 100 MCG/2ML IJ SOLN
INTRAMUSCULAR | Status: AC
  Filled 2015-03-21: qty 4

## 2015-03-21 MED ORDER — CEFAZOLIN SODIUM-DEXTROSE 2-3 GM-% IV SOLR
2.0000 g | INTRAVENOUS | Status: AC
Start: 2015-03-21 — End: 2015-03-21
  Administered 2015-03-21: 2 g via INTRAVENOUS

## 2015-03-21 MED ORDER — OXYCODONE HCL 5 MG PO TABS: 5.0000 mg | ORAL_TABLET | Freq: Once | ORAL | Status: DC | PRN

## 2015-03-21 MED ORDER — ONDANSETRON HCL 4 MG/2ML IJ SOLN
INTRAMUSCULAR | Status: DC | PRN
  Administered 2015-03-21: 4 mg via INTRAVENOUS

## 2015-03-21 MED ORDER — IBUPROFEN 100 MG/5ML PO SUSP: 200.0000 mg | Freq: Four times a day (QID) | ORAL | Status: DC | PRN

## 2015-03-21 MED ORDER — LIDOCAINE HCL (CARDIAC) 20 MG/ML IV SOLN
INTRAVENOUS | Status: DC | PRN
  Administered 2015-03-21: 80 mg via INTRAVENOUS

## 2015-03-21 MED ORDER — PROPOFOL 10 MG/ML IV BOLUS
INTRAVENOUS | Status: DC | PRN
  Administered 2015-03-21: 150 mg via INTRAVENOUS

## 2015-03-21 MED ORDER — FENTANYL CITRATE (PF) 100 MCG/2ML IJ SOLN
INTRAMUSCULAR | Status: AC
  Filled 2015-03-21: qty 2

## 2015-03-21 MED ORDER — LACTATED RINGERS IV SOLN
INTRAVENOUS | Status: DC
  Administered 2015-03-21: 09:00:00 via INTRAVENOUS

## 2015-03-21 MED ORDER — METHYLENE BLUE 1 % INJ SOLN
INTRAMUSCULAR | Status: AC
  Filled 2015-03-21: qty 10

## 2015-03-21 MED ORDER — FENTANYL CITRATE (PF) 100 MCG/2ML IJ SOLN
INTRAMUSCULAR | Status: DC | PRN
  Administered 2015-03-21 (×4): 25 ug via INTRAVENOUS

## 2015-03-21 MED ORDER — OXYCODONE HCL 5 MG/5ML PO SOLN: 5.0000 mg | Freq: Once | ORAL | Status: DC | PRN

## 2015-03-21 MED ORDER — HYDROCODONE-ACETAMINOPHEN 5-325 MG PO TABS: 1.0000 | ORAL_TABLET | Freq: Four times a day (QID) | ORAL | Status: DC | PRN

## 2015-03-21 MED ORDER — IBUPROFEN 200 MG PO TABS: 200.0000 mg | ORAL_TABLET | Freq: Four times a day (QID) | ORAL | Status: DC | PRN

## 2015-03-21 MED ORDER — BUPIVACAINE-EPINEPHRINE 0.5% -1:200000 IJ SOLN
INTRAMUSCULAR | Status: DC | PRN
  Administered 2015-03-21: 7 mL

## 2015-03-21 MED ORDER — PROPOFOL 500 MG/50ML IV EMUL
INTRAVENOUS | Status: AC
  Filled 2015-03-21: qty 50

## 2015-03-21 MED ORDER — SODIUM CHLORIDE 0.9 % IJ SOLN
INTRAMUSCULAR | Status: DC | PRN
  Administered 2015-03-21: 5 mL via INTRAMUSCULAR

## 2015-03-21 MED ORDER — MIDAZOLAM HCL 2 MG/2ML IJ SOLN
1.0000 mg | INTRAMUSCULAR | Status: DC | PRN
  Administered 2015-03-21: 2 mg via INTRAVENOUS

## 2015-03-21 MED ORDER — FENTANYL CITRATE (PF) 100 MCG/2ML IJ SOLN
100.0000 ug | Freq: Once | INTRAMUSCULAR | Status: AC
  Administered 2015-03-21: 100 ug via INTRAVENOUS

## 2015-03-21 MED ORDER — HYDROMORPHONE HCL 1 MG/ML IJ SOLN
0.2500 mg | INTRAMUSCULAR | Status: DC | PRN
  Administered 2015-03-21 (×2): 0.25 mg via INTRAVENOUS
  Administered 2015-03-21: 0.5 mg via INTRAVENOUS
  Administered 2015-03-21: 0.25 mg via INTRAVENOUS

## 2015-03-21 MED ORDER — 0.9 % SODIUM CHLORIDE (POUR BTL) OPTIME
TOPICAL | Status: DC | PRN
  Administered 2015-03-21: 1000 mL

## 2015-03-21 MED ORDER — SODIUM CHLORIDE 0.9 % IJ SOLN
INTRAMUSCULAR | Status: AC
  Filled 2015-03-21: qty 10

## 2015-03-21 MED ORDER — KETOROLAC TROMETHAMINE 30 MG/ML IJ SOLN: 30.0000 mg | Freq: Once | INTRAMUSCULAR | Status: DC | PRN

## 2015-03-21 MED ORDER — BUPIVACAINE-EPINEPHRINE (PF) 0.5% -1:200000 IJ SOLN
INTRAMUSCULAR | Status: AC
  Filled 2015-03-21: qty 30

## 2015-03-21 MED ORDER — TECHNETIUM TC 99M SULFUR COLLOID FILTERED
1.0000 | Freq: Once | INTRAVENOUS | Status: AC | PRN
  Administered 2015-03-21: 1 via INTRADERMAL

## 2015-03-21 MED ORDER — CEFAZOLIN SODIUM-DEXTROSE 2-3 GM-% IV SOLR
INTRAVENOUS | Status: AC
  Filled 2015-03-21: qty 50

## 2015-03-21 MED ORDER — DEXAMETHASONE SODIUM PHOSPHATE 4 MG/ML IJ SOLN
INTRAMUSCULAR | Status: DC | PRN
  Administered 2015-03-21: 5 mg via INTRAVENOUS

## 2015-03-21 SURGICAL SUPPLY — 65 items
ADH SKN CLS APL DERMABOND .7 (GAUZE/BANDAGES/DRESSINGS) ×1
APL SKNCLS STERI-STRIP NONHPOA (GAUZE/BANDAGES/DRESSINGS)
APPLIER CLIP 9.375 MED OPEN (MISCELLANEOUS) ×2
APR CLP MED 9.3 20 MLT OPN (MISCELLANEOUS) ×1
BENZOIN TINCTURE PRP APPL 2/3 (GAUZE/BANDAGES/DRESSINGS) IMPLANT
BINDER BREAST LRG (GAUZE/BANDAGES/DRESSINGS) IMPLANT
BINDER BREAST MEDIUM (GAUZE/BANDAGES/DRESSINGS) IMPLANT
BINDER BREAST XLRG (GAUZE/BANDAGES/DRESSINGS) IMPLANT
BINDER BREAST XXLRG (GAUZE/BANDAGES/DRESSINGS) ×1 IMPLANT
BLADE HEX COATED 2.75 (ELECTRODE) ×2 IMPLANT
BLADE SURG 10 STRL SS (BLADE) IMPLANT
BLADE SURG 15 STRL LF DISP TIS (BLADE) ×1 IMPLANT
BLADE SURG 15 STRL SS (BLADE) ×4
CANISTER SUC SOCK COL 7IN (MISCELLANEOUS) IMPLANT
CANISTER SUCT 1200ML W/VALVE (MISCELLANEOUS) ×2 IMPLANT
CHLORAPREP W/TINT 26ML (MISCELLANEOUS) ×2 IMPLANT
CLIP APPLIE 9.375 MED OPEN (MISCELLANEOUS) ×1 IMPLANT
COVER BACK TABLE 60X90IN (DRAPES) ×2 IMPLANT
COVER MAYO STAND STRL (DRAPES) ×2 IMPLANT
COVER PROBE W GEL 5X96 (DRAPES) ×2 IMPLANT
DECANTER SPIKE VIAL GLASS SM (MISCELLANEOUS) IMPLANT
DERMABOND ADVANCED (GAUZE/BANDAGES/DRESSINGS) ×1
DERMABOND ADVANCED .7 DNX12 (GAUZE/BANDAGES/DRESSINGS) ×1 IMPLANT
DEVICE DUBIN W/COMP PLATE 8390 (MISCELLANEOUS) ×2 IMPLANT
DRAPE LAPAROSCOPIC ABDOMINAL (DRAPES) ×2 IMPLANT
DRAPE UTILITY XL STRL (DRAPES) ×2 IMPLANT
DRSG PAD ABDOMINAL 8X10 ST (GAUZE/BANDAGES/DRESSINGS) ×1 IMPLANT
ELECT REM PT RETURN 9FT ADLT (ELECTROSURGICAL) ×2
ELECTRODE REM PT RTRN 9FT ADLT (ELECTROSURGICAL) ×1 IMPLANT
GAUZE SPONGE 4X4 12PLY STRL (GAUZE/BANDAGES/DRESSINGS) IMPLANT
GLOVE BIO SURGEON STRL SZ 6.5 (GLOVE) ×2 IMPLANT
GLOVE BIO SURGEON STRL SZ7.5 (GLOVE) ×3 IMPLANT
GLOVE BIOGEL PI IND STRL 7.5 (GLOVE) IMPLANT
GLOVE BIOGEL PI INDICATOR 7.5 (GLOVE) ×1
GLOVE EUDERMIC 7 POWDERFREE (GLOVE) ×4 IMPLANT
GLOVE EXAM NITRILE LRG STRL (GLOVE) ×1 IMPLANT
GOWN STRL REUS W/ TWL LRG LVL3 (GOWN DISPOSABLE) ×2 IMPLANT
GOWN STRL REUS W/ TWL XL LVL3 (GOWN DISPOSABLE) ×1 IMPLANT
GOWN STRL REUS W/TWL LRG LVL3 (GOWN DISPOSABLE) ×4
GOWN STRL REUS W/TWL XL LVL3 (GOWN DISPOSABLE) ×2
KIT MARKER MARGIN INK (KITS) ×2 IMPLANT
NDL HYPO 25X1 1.5 SAFETY (NEEDLE) ×2 IMPLANT
NDL SAFETY ECLIPSE 18X1.5 (NEEDLE) ×1 IMPLANT
NEEDLE HYPO 18GX1.5 SHARP (NEEDLE) ×2
NEEDLE HYPO 25X1 1.5 SAFETY (NEEDLE) ×4 IMPLANT
NS IRRIG 1000ML POUR BTL (IV SOLUTION) ×2 IMPLANT
PACK BASIN DAY SURGERY FS (CUSTOM PROCEDURE TRAY) ×2 IMPLANT
PENCIL BUTTON HOLSTER BLD 10FT (ELECTRODE) ×2 IMPLANT
SHEET MEDIUM DRAPE 40X70 STRL (DRAPES) ×1 IMPLANT
SLEEVE SCD COMPRESS KNEE MED (MISCELLANEOUS) ×2 IMPLANT
SPONGE LAP 18X18 X RAY DECT (DISPOSABLE) IMPLANT
SPONGE LAP 4X18 X RAY DECT (DISPOSABLE) ×3 IMPLANT
STRIP CLOSURE SKIN 1/2X4 (GAUZE/BANDAGES/DRESSINGS) IMPLANT
SUT MNCRL AB 4-0 PS2 18 (SUTURE) ×3 IMPLANT
SUT SILK 2 0 SH (SUTURE) ×2 IMPLANT
SUT VIC AB 2-0 CT1 27 (SUTURE)
SUT VIC AB 2-0 CT1 TAPERPNT 27 (SUTURE) IMPLANT
SUT VIC AB 3-0 SH 27 (SUTURE)
SUT VIC AB 3-0 SH 27X BRD (SUTURE) IMPLANT
SUT VICRYL 3-0 CR8 SH (SUTURE) ×3 IMPLANT
SYRINGE 10CC LL (SYRINGE) ×4 IMPLANT
TOWEL OR 17X24 6PK STRL BLUE (TOWEL DISPOSABLE) ×2 IMPLANT
TOWEL OR NON WOVEN STRL DISP B (DISPOSABLE) ×2 IMPLANT
TUBE CONNECTING 20X1/4 (TUBING) ×2 IMPLANT
YANKAUER SUCT BULB TIP NO VENT (SUCTIONS) ×2 IMPLANT

## 2015-03-21 NOTE — Transfer of Care (Signed)
Immediate Anesthesia Transfer of Care Note  Patient: [name]  Procedure(s) Performed: Procedure(s): RIGHT  PARTIAL MASTECTOMY WITH RADIOACTIVE SEED LOCALIZATION RIGHT  AXILLARY SENTINEL LYMPH NODE BIOPSY (Right)  Patient Location: PACU  Anesthesia Type:GA combined with regional for post-op pain  Level of Consciousness: awake and patient cooperative  Airway & Oxygen Therapy: Patient Spontanous Breathing and Patient connected to face mask oxygen  Post-op Assessment: Report given to RN and Post -op Vital signs reviewed and stable  Post vital signs: Reviewed and stable  Last Vitals:  Filed Vitals:   03/21/15 0929  BP:   Pulse: 70  Temp:   Resp: 13    Complications: No apparent anesthesia complications

## 2015-03-21 NOTE — Anesthesia Procedure Notes (Addendum)
Anesthesia Regional Block:  Pectoralis block  Pre-Anesthetic Checklist: ,, timeout performed, Correct Patient, Correct Site, Correct Laterality, Correct Procedure, Correct Position, site marked, Risks and benefits discussed,  Surgical consent,  Pre-op evaluation,  At surgeon's request and post-op pain management  Laterality: Right and Upper  Prep: chloraprep       Needles:  Injection technique: Single-shot  Needle Type: Echogenic Needle     Needle Length: 9cm 9 cm Needle Gauge: 21 and 21 G    Additional Needles:  Procedures: ultrasound guided (picture in chart) Pectoralis block Narrative:  Start time: 03/21/2015 9:30 AM End time: 03/21/2015 9:35 AM Injection made incrementally with aspirations every 5 mL.  Performed by: Personally  Anesthesiologist: CREWS, DAVID   Procedure Name: LMA Insertion Date/Time: 03/21/2015 9:48 AM Performed by: Maryella Shivers Pre-anesthesia Checklist: Patient identified, Emergency Drugs available, Suction available and Patient being monitored Patient Re-evaluated:Patient Re-evaluated prior to inductionOxygen Delivery Method: Circle System Utilized Preoxygenation: Pre-oxygenation with 100% oxygen Intubation Type: IV induction Ventilation: Mask ventilation without difficulty LMA: LMA inserted LMA Size: 4.0 Number of attempts: 1 Airway Equipment and Method: Bite block Placement Confirmation: positive ETCO2 Tube secured with: Tape Dental Injury: Teeth and Oropharynx as per pre-operative assessment

## 2015-03-21 NOTE — Anesthesia Preprocedure Evaluation (Signed)
Anesthesia Evaluation  Patient identified by MRN, date of birth, ID band Patient awake    Reviewed: Allergy & Precautions, NPO status , Patient's Chart, lab work & pertinent test results  Airway Mallampati: I  TM Distance: >3 FB Neck ROM: Full    Dental  (+) Teeth Intact, Dental Advisory Given   Pulmonary  breath sounds clear to auscultation        Cardiovascular hypertension, Pt. on medications +CHF Rhythm:Regular     Neuro/Psych    GI/Hepatic   Endo/Other  diabetes, Well Controlled, Type 2Morbid obesity  Renal/GU      Musculoskeletal   Abdominal   Peds  Hematology   Anesthesia Other Findings   Reproductive/Obstetrics                             Anesthesia Physical Anesthesia Plan  ASA: III  Anesthesia Plan: General   Post-op Pain Management:    Induction: Intravenous  Airway Management Planned: LMA  Additional Equipment:   Intra-op Plan:   Post-operative Plan: Extubation in OR  Informed Consent: I have reviewed the patients History and Physical, chart, labs and discussed the procedure including the risks, benefits and alternatives for the proposed anesthesia with the patient or authorized representative who has indicated his/her understanding and acceptance.   Dental advisory given  Plan Discussed with: CRNA, Anesthesiologist and Surgeon  Anesthesia Plan Comments:         Anesthesia Quick Evaluation

## 2015-03-21 NOTE — Interval H&P Note (Signed)
History and Physical Interval Note:  07/08/1750 0:25 AM  [name]  has presented today for surgery, with the diagnosis of cancer right breast   The goals and the various methods of treatment have been discussed with the patient and family. After consideration of risks, benefits and other options for treatment, the patient has consented to  Procedure(s): RIGHT  PARTIAL MASTECTOMY WITH RADIOACTIVE SEED LOCALIZATION RIGHT  AXILLARY SENTINEL LYMPH NODE BIOPSY (Right) as a surgical intervention .  The patient's history has been reviewed, patient examined today, no change in status, stable for surgery.  I have reviewed the patient's chart and labs.  Questions were answered to the patient's satisfaction.     Adin Hector

## 2015-03-21 NOTE — Op Note (Signed)
Patient Name:           [name]   Date of Surgery:        03/21/2015  Pre op Diagnosis:   Invasive cancer right  Breast, 6:00 position, estrogen receptor positive, HER-2 negative  Post op Diagnosis:    Same  Procedure:      Inject blue dye right breast, right partial mastectomy with radioactive seed localization, right axillary sentinel node biopsy.          Surgeon:                     Edsel Petrin. Dalbert Batman, M.D., FACS  Assistant:                      OR staff  Operative Indications:   The patient is a 64 year old female who presents with breast cancer. She is referred by [name] at the breast center of [facility] for management of a clinical stage I cancer of the right breast at the 6:00 position. [name] is her PCP at the [facility] family [facility]. She was  evaluated in the [facility] recently  by [name], [name], and me. The patient gets annual mammograms. She has never had a breast problem in the past. Recent mammograms and ultrasound show a 6 mm lobulated mass in the right breast at the 6:00 position, posterior depth. The right axilla looks normal on ultrasound. Image guided biopsy shows mixed invasive and in situ ductal carcinoma. Hormone receptor positive. HER-2 negative. MRI shows a small cancer in the central right breast that is solitary. No adenopathy. Left breast is normal. She has significant comorbidities. Diabetes with diabetic foot deformity, walks with a cane. Hypertension, depression, acute gouty arthritis, asthma, anemia, history of systolic congestive heart failure, compensated, obesity, chronic kidney disease stage III, history bilateral total knee replacement, history laparoscopic cholecystectomy. History keloid formation. Family history is negative for breast cancer. 2 paternal cousins had some type of gynecologic cancer possibly ovarian.  . We all agreed that we would proceed with right partial mastectomy with radioactive seed localization and  right axillary sentinel node biopsy. I  Operative Findings:       The radioactive seed was audible with the use of the neoprobe both in the holding area and the operating room. A single radioactive seed was removed based on the specimen radiograph. Specimen radiograph looked good with the marker clip and seed in the center of the specimen. I found a single sentinel lymph node in the right axilla.  Procedure in Detail:          The patient underwent placement of radioactive seed at the breast center of [facility] recently. She was brought to the holding area where we were able to hear the audible signal of the radioactive seed. She underwent injection of technetium 99 radionuclide into the breast by the nuclear medicine technician. A right pectoral block was performed by Dr. crews. The patient was taken to the operating room and underwent general anesthesia with an LMA device. Surgical timeout was performed. Following alcohol prep I injected 5 mL of blue dye into the right breast, subareolar area, and massaged breast for a few minutes. The right breast and axilla and chest wall were then prepped and draped in a sterile fashion. 0.5% Marcaine with epinephrine was used as local infiltration anesthetic into the skin and subcutaneous tissues.   Using the neoprobe as a guide, I made  a radially oriented incision at the 6:00 position, between the areolar margin and the inframammary crease. Using the neoprobe as a guide I dissected down and dissected around the radioactive signal. The specimen was removed and marked with silk sutures and a 6 color ink kit to orient the pathologist. Specimen mammogram looked good as described above. The specimen was sent to pathology lab. Hemostasis was excellent and achieved  with electrocautery. Metal clips were placed into the lumpectomy cavity in the usual fashion. I closed the breast tissues in 2 layers with interrupted 3-0 Vicryl and the skin was closed with a running subcuticular  4-0 Monocryl and Dermabond.    A transverse incision was made in the right axilla at the hairline. Using the neoprobe as a guide I dissected down through subcutaneous tissue, through the clavipectoral fascia, and into the level I lymph node basin. I found a single sentinel lymph node, very hot and very blue that was removed. After I removed this there was essentially no radioactivity and no blue dye anywhere and level I or level II lymph nodes. Axilla was irrigated. Hemostasis was excellent. Deeper tissues were closed with 3-0 Vicryl sutures and the skin closed with a running subcuticular 4-0 Monocryl and Dermabond. Breast binder was placed and patient taken to PACU in stable condition. EBL 20 mL or less. Counts correct. Complications none.     Edsel Petrin. Dalbert Batman, M.D., FACS General and Minimally Invasive Surgery Breast and Colorectal Surgery  03/21/2015 11:14 AM

## 2015-03-21 NOTE — Progress Notes (Signed)
Assisted Dr. Crews with right, ultrasound guided, pectoralis block. Side rails up, monitors on throughout procedure. See vital signs in flow sheet. Tolerated Procedure well. 

## 2015-03-21 NOTE — Progress Notes (Signed)
Emotional support during breast injections °

## 2015-03-21 NOTE — Discharge Instructions (Signed)
[facility] °Office Phone Number [phone] ° °BREAST BIOPSY/ PARTIAL MASTECTOMY: POST OP INSTRUCTIONS ° °Always review your discharge instruction sheet given to you by the facility where your surgery was performed. ° °IF YOU HAVE DISABILITY OR FAMILY LEAVE FORMS, YOU MUST BRING THEM TO THE OFFICE FOR PROCESSING.  DO NOT GIVE THEM TO YOUR DOCTOR. ° °1. A prescription for pain medication may be given to you upon discharge.  Take your pain medication as prescribed, if needed.  If narcotic pain medicine is not needed, then you may take acetaminophen (Tylenol) or ibuprofen (Advil) as needed. °2. Take your usually prescribed medications unless otherwise directed °3. If you need a refill on your pain medication, please contact your pharmacy.  They will contact our office to request authorization.  Prescriptions will not be filled after 5pm or on week-ends. °4. You should eat very light the first 24 hours after surgery, such as soup, crackers, pudding, etc.  Resume your normal diet the day after surgery. °5. Most patients will experience some swelling and bruising in the breast.  Ice packs and a good support bra will help.  Swelling and bruising can take several days to resolve.  °6. It is common to experience some constipation if taking pain medication after surgery.  Increasing fluid intake and taking a stool softener will usually help or prevent this problem from occurring.  A mild laxative (Milk of Magnesia or Miralax) should be taken according to package directions if there are no bowel movements after 48 hours. °7. Unless discharge instructions indicate otherwise, you may remove your bandages 24-48 hours after surgery, and you may shower at that time.  You may have steri-strips (small skin tapes) in place directly over the incision.  These strips should be left on the skin for 7-10 days.  If your surgeon used skin glue on the incision, you may shower in 24 hours.  The glue will flake off over the  next 2-3 weeks.  Any sutures or staples will be removed at the office during your follow-up visit. °8. ACTIVITIES:  You may resume regular daily activities (gradually increasing) beginning the next day.  Wearing a good support bra or sports bra minimizes pain and swelling.  You may have sexual intercourse when it is comfortable. °a. You may drive when you no longer are taking prescription pain medication, you can comfortably wear a seatbelt, and you can safely maneuver your car and apply brakes. °b. RETURN TO WORK:  ______________________________________________________________________________________ °9. You should see your doctor in the office for a follow-up appointment approximately two weeks after your surgery.  Your doctor’s nurse will typically make your follow-up appointment when she calls you with your pathology report.  Expect your pathology report 2-3 business days after your surgery.  You may call to check if you do not hear from us after three days. °10. OTHER INSTRUCTIONS: _______________________________________________________________________________________________ _____________________________________________________________________________________________________________________________________ °_____________________________________________________________________________________________________________________________________ °_____________________________________________________________________________________________________________________________________ ° °WHEN TO CALL YOUR DOCTOR: °1. Fever over 101.0 °2. Nausea and/or vomiting. °3. Extreme swelling or bruising. °4. Continued bleeding from incision. °5. Increased pain, redness, or drainage from the incision. ° °The clinic staff is available to answer your questions during regular business hours.  Please don’t hesitate to call and ask to speak to one of the nurses for clinical concerns.  If you have a medical emergency, go to the nearest  emergency room or call 911.  A surgeon from [facility] is always on call at the hospital. ° °For further questions, please visit [email or web address]  ° ° ° °  Post Anesthesia Home Care Instructions ° °Activity: °Get plenty of rest for the remainder of the day. A responsible adult should stay with you for 24 hours following the procedure.  °For the next 24 hours, DO NOT: °-Drive a car °-Operate machinery °-Drink alcoholic beverages °-Take any medication unless instructed by your physician °-Make any legal decisions or sign important papers. ° °Meals: °Start with liquid foods such as gelatin or soup. Progress to regular foods as tolerated. Avoid greasy, spicy, heavy foods. If nausea and/or vomiting occur, drink only clear liquids until the nausea and/or vomiting subsides. Call your physician if vomiting continues. ° °Special Instructions/Symptoms: °Your throat may feel dry or sore from the anesthesia or the breathing tube placed in your throat during surgery. If this causes discomfort, gargle with warm salt water. The discomfort should disappear within 24 hours. ° °If you had a scopolamine patch placed behind your ear for the management of post- operative nausea and/or vomiting: ° °1. The medication in the patch is effective for 72 hours, after which it should be removed.  Wrap patch in a tissue and discard in the trash. Wash hands thoroughly with soap and water. °2. You may remove the patch earlier than 72 hours if you experience unpleasant side effects which may include dry mouth, dizziness or visual disturbances. °3. Avoid touching the patch. Wash your hands with soap and water after contact with the patch. °  ° °

## 2015-03-21 NOTE — Anesthesia Postprocedure Evaluation (Signed)
  Anesthesia Post-op Note  Patient: [name]  Procedure(s) Performed: Procedure(s): RIGHT  PARTIAL MASTECTOMY WITH RADIOACTIVE SEED LOCALIZATION RIGHT  AXILLARY SENTINEL LYMPH NODE BIOPSY (Right)  Patient Location: PACU  Anesthesia Type: General, regional   Level of Consciousness: awake, alert  and oriented  Airway and Oxygen Therapy: Patient Spontanous Breathing  Post-op Pain: mild  Post-op Assessment: Post-op Vital signs reviewed  Post-op Vital Signs: Reviewed  Last Vitals:  Filed Vitals:   03/21/15 1250  BP: 132/90  Pulse: 67  Temp: 36.5 C  Resp: 18    Complications: No apparent anesthesia complications

## 2015-03-24 ENCOUNTER — Encounter (HOSPITAL_BASED_OUTPATIENT_CLINIC_OR_DEPARTMENT_OTHER): Payer: Self-pay | Admitting: General Surgery

## 2015-03-24 NOTE — Progress Notes (Signed)
Quick Note:  Inform patient of Pathology report,. Cancer completely removed and nodes negative. No further surgery necessary. Stage one cancer. Good news. Will discuss in detail at next OV.  Arrange follow up with Med-onc and rad-onc.  hmi ______

## 2015-04-01 ENCOUNTER — Ambulatory Visit (INDEPENDENT_AMBULATORY_CARE_PROVIDER_SITE_OTHER): Payer: Medicare Other

## 2015-04-01 DIAGNOSIS — Z9181 History of falling: Secondary | ICD-10-CM | POA: Diagnosis not present

## 2015-04-01 DIAGNOSIS — E669 Obesity, unspecified: Secondary | ICD-10-CM | POA: Diagnosis not present

## 2015-04-01 DIAGNOSIS — J45909 Unspecified asthma, uncomplicated: Secondary | ICD-10-CM | POA: Diagnosis not present

## 2015-04-01 DIAGNOSIS — M21969 Unspecified acquired deformity of unspecified lower leg: Secondary | ICD-10-CM

## 2015-04-01 DIAGNOSIS — E119 Type 2 diabetes mellitus without complications: Secondary | ICD-10-CM | POA: Diagnosis not present

## 2015-04-01 DIAGNOSIS — E1142 Type 2 diabetes mellitus with diabetic polyneuropathy: Secondary | ICD-10-CM

## 2015-04-01 DIAGNOSIS — E1169 Type 2 diabetes mellitus with other specified complication: Secondary | ICD-10-CM

## 2015-04-01 DIAGNOSIS — E1140 Type 2 diabetes mellitus with diabetic neuropathy, unspecified: Secondary | ICD-10-CM | POA: Diagnosis not present

## 2015-04-01 DIAGNOSIS — M2021 Hallux rigidus, right foot: Secondary | ICD-10-CM

## 2015-04-01 DIAGNOSIS — I509 Heart failure, unspecified: Secondary | ICD-10-CM | POA: Diagnosis not present

## 2015-04-01 DIAGNOSIS — C50511 Malignant neoplasm of lower-outer quadrant of right female breast: Secondary | ICD-10-CM | POA: Diagnosis not present

## 2015-04-01 DIAGNOSIS — M2022 Hallux rigidus, left foot: Secondary | ICD-10-CM

## 2015-04-01 DIAGNOSIS — Q828 Other specified congenital malformations of skin: Secondary | ICD-10-CM

## 2015-04-01 DIAGNOSIS — I10 Essential (primary) hypertension: Secondary | ICD-10-CM | POA: Diagnosis not present

## 2015-04-01 DIAGNOSIS — M2012 Hallux valgus (acquired), left foot: Secondary | ICD-10-CM

## 2015-04-01 DIAGNOSIS — M2011 Hallux valgus (acquired), right foot: Secondary | ICD-10-CM | POA: Diagnosis not present

## 2015-04-01 DIAGNOSIS — F329 Major depressive disorder, single episode, unspecified: Secondary | ICD-10-CM | POA: Diagnosis not present

## 2015-04-01 NOTE — Progress Notes (Signed)
Patient ID: [name], female   DOB: June 24, 1951, 64 y.o.   MRN: [id number] Patient presents to pick up diabetic shoe and inserts.  Shoes are tried on for good fit. Patient received 1 pair [employer] Balance 813 Athletic lace in black with 3 pairs custom molded diabetic inserts.  Written break in and wear instructions given. Patient will follow up for routine care as scheduled.

## 2015-04-01 NOTE — Progress Notes (Deleted)
HPI Presents today chief complaint of painful elongated toenails. Pt is also here to PUDS  Objective: Pulses are palpable bilateral nails are thick, yellow dystrophic onychomycosis and painful palpation.   Assessment: Onychomycosis with pain in limb.  Plan: Treatment of nails in thickness and length as covered service secondary to pain.

## 2015-04-01 NOTE — Patient Instructions (Signed)

## 2015-04-02 NOTE — Progress Notes (Signed)
   Department of Radiation Oncology  Phone:  [phone] Fax:        [phone]   Name: [name] MRN: [id number]  DOB: August 03, 1951  Date: 04/03/2015  Follow Up Visit Note  Diagnosis: Breast cancer of lower-outer quadrant of right female breast   Staging form: Breast, AJCC 7th Edition     Clinical stage from 02/19/2015: Stage IA (T1b, N0, M0) - Unsigned       Staging comments: Staged at breast conference on 3.23.16      Pathologic stage from 03/24/2015: Stage IA (T1c, N0, cM0) - Signed by Enid Cutter, MD on 03/31/2015       Staging comments: Staged on final lumpectomy specimen by Dr. Donato Heinz.   Interval History: [name] presents today for routine followup.  [name] underwent her lumpectomy on 03/21/15. She was found to have a 1.1 cm invasive ductile carcinoma which was Grade 1. This was ER, PR positive, HER 2 negative. Her margins were widely negative. She is meeting with [name] tomorrow, 04/03/15. Chemotherapy is not planned. She has recovered well from surgery. She was sent to see me for my opinion regarding radiation in the management of her disease.   Physical Exam:  Filed Vitals:   04/03/15 1059  BP: 117/67  Pulse: 69  Temp: 97.6 F (36.4 C)  Weight: 234 lb 14.4 oz (106.55 kg)  SpO2: 100%   WEll healed surgical incision in the inferior aspect of the breast. The sentinel lymph node incision has no signs of infection but has some superficial dehiscence.   IMPRESSION: [name] is a 64 y.o. female T1c N0 invasive ductile carcinoma of the right breast  PLAN:  I spoke to the patient today regarding her diagnosis and options for treatment. We discussed the equivalence in terms of survival and local failure between mastectomy and breast conservation. We discussed the role of radiation in decreasing local failures in patients who undergo lumpectomy. We discussed the process of simulation and the placement tattoos. We discussed 4-6 weeks of treatment as an outpatient. We discussed the  possibility of asymptomatic lung damage. We discussed the low likelihood of secondary malignancies. We discussed the possible side effects including but not limited to skin redness, fatigue, permanent skin darkening, and breast swelling.  She signed informed consent.   I asked her to call and schedule her simulation when she was released by [name].  The earliest we would start is a month out from her surgery.  I also encouraged her to cover the wound in her axilla until it was healed.   This document serves as a record of services personally performed by Thea Silversmith, MD. It was created on her behalf by Arlyce Harman, a trained medical scribe. The creation of this record is based on the scribe's personal observations and the provider's statements to them. This document has been checked and approved by the attending provider.      Thea Silversmith, MD

## 2015-04-02 NOTE — Progress Notes (Signed)
Location of Breast Cancer:Ductal carcinoma or right breast. Lower-outer quadrant.  Histology per Pathology Report:03/21/15 osis 1. Breast, lumpectomy, right - INVASIVE DUCTAL CARCINOMA, SEE COMMENT. - DUCTAL CARCINOMA IN SITU. - NEGATIVE FOR LYMPH VASCULAR INVASION. - PREVIOUS BIOPSY SITE IDENTIFIED. - SEE TUMOR SYNOPTIC TEMPLATE BELOW. 2. Lymph node, sentinel, biopsy, right axillary - ONE LYMPH NODE, NEGATIVE FOR TUMOR (0/1).  Receptor Status: ER(+), PR (+), Her2-neu (-)  Did patient present with symptoms (if so, please note symptoms) or was this found on screening mammography?:Found during routine screening.   Past/Anticipated interventions by surgeon, if any:03/21/15 RADIOACTIVE SEED GUIDED PARTIAL MASTECTOMY WITH AXILLARY SENTINEL LYMPH NODE BIOPSY  Past/Anticipated interventions by medical oncology, if any: Chemotherapy   Lymphedema issues, if any:No   Pain issues, if any: mild right discomfort.Has chronic arthritic pain.  SAFETY ISSUES:  Prior radiation? Yes had radiation for right ear keloid.  Pacemaker/ICD? No  Possible current pregnancy?Is the patient on methotrexate? No Ambulates with   Current Complaints / other details:Widowed for 6 months.G3P2.2 daughters and 1 son.Birthed 2 and raised one. First live birth age 11.No hormonal replacement therapy .Contraceptives [phone]. Family history of head/neck and brain cancer. NKDA Ambulates with cane    Arlyss Repress, RN 04/02/2015,11:50 AM  BP 117/67 mmHg  Pulse 69  Temp(Src) 97.6 F (36.4 C)  Wt 234 lb 14.4 oz (106.55 kg)  SpO2 100%

## 2015-04-03 ENCOUNTER — Ambulatory Visit
Admission: RE | Admit: 2015-04-03 | Discharge: 2015-04-03 | Disposition: A | Discharge status: Home / Self Care | Payer: Medicare Other | Source: Ambulatory Visit | Attending: Radiation Oncology | Admitting: Radiation Oncology

## 2015-04-03 ENCOUNTER — Telehealth: Payer: Self-pay | Admitting: *Deleted

## 2015-04-03 VITALS — BP 117/67 | HR 69 | Temp 97.6°F | Wt 234.9 lb

## 2015-04-03 DIAGNOSIS — C50511 Malignant neoplasm of lower-outer quadrant of right female breast: Secondary | ICD-10-CM | POA: Diagnosis not present

## 2015-04-03 NOTE — Telephone Encounter (Signed)
Faxed verification of appointment  &  transportation to [facility] Department of [employer]

## 2015-04-03 NOTE — Progress Notes (Signed)
Please see the Nurse Progress Note in the MD Initial Consult Encounter for this patient. 

## 2015-04-04 ENCOUNTER — Ambulatory Visit (HOSPITAL_BASED_OUTPATIENT_CLINIC_OR_DEPARTMENT_OTHER): Payer: Medicare Other | Admitting: Hematology

## 2015-04-04 VITALS — BP 142/76 | HR 69 | Temp 97.6°F | Resp 18 | Ht 62.0 in | Wt 231.6 lb

## 2015-04-04 DIAGNOSIS — E119 Type 2 diabetes mellitus without complications: Secondary | ICD-10-CM

## 2015-04-04 DIAGNOSIS — I509 Heart failure, unspecified: Secondary | ICD-10-CM | POA: Diagnosis not present

## 2015-04-04 DIAGNOSIS — M1990 Unspecified osteoarthritis, unspecified site: Secondary | ICD-10-CM

## 2015-04-04 DIAGNOSIS — C50811 Malignant neoplasm of overlapping sites of right female breast: Secondary | ICD-10-CM | POA: Diagnosis not present

## 2015-04-04 DIAGNOSIS — C50511 Malignant neoplasm of lower-outer quadrant of right female breast: Secondary | ICD-10-CM

## 2015-04-04 DIAGNOSIS — Z170 Estrogen receptor positive status [ER+]: Secondary | ICD-10-CM

## 2015-04-04 DIAGNOSIS — I10 Essential (primary) hypertension: Secondary | ICD-10-CM

## 2015-04-04 NOTE — Progress Notes (Signed)
[place]  [phone] Fax:([phone]) [place] Note   Patient Care Team: Lind Covert, MD as PCP - General (Family Medicine) Harriet Masson, DPM (Podiatry) Gevena Cotton, MD (Ophthalmology) Rana Snare, MD (Urology) Dr. Edwin Cap ([place]) Fanny Skates, MD as Consulting Physician (General Surgery) Thea Silversmith, MD as Consulting Physician (Radiation Oncology) Mauro Kaufmann, RN as Registered Nurse Rockwell Germany, RN as Registered Nurse Holley Bouche, NP as Nurse Practitioner (Nurse Practitioner) Truitt Merle, MD as Consulting Physician (Hematology) 04/05/2015  CHIEF COMPLAINTS/PURPOSE OF CONSULTATION:  Follow up breast cancer  Oncology History   Breast cancer of lower-outer quadrant of right female breast   Staging form: Breast, AJCC 7th Edition     Clinical stage from 02/19/2015: Stage IA (T1b, N0, M0) - Unsigned       Staging comments: Staged at breast conference on 3.23.16      Pathologic stage from 03/24/2015: Stage IA (T1c, N0, cM0) - Signed by Enid Cutter, MD on 03/31/2015       Staging comments: Staged on final lumpectomy specimen by Dr. Donato Heinz.        Breast cancer of lower-outer quadrant of right female breast   02/03/2015 Breast US ultrasound is performed, showing a 5 x 5 x 5 mm irregular hypoechoic mass with internal color vascularity within the right breast 6 o'clock position 4 cm from the nipple   02/10/2015 Pathology Results INVASIVE DUCTAL CARCINOMA. - DUCTAL CARCINOMA IN SITU.   02/10/2015 Receptors her2 Estrogen Receptor: 100%, POSITIVE, STRONG STAINING INTENSITY Progesterone Receptor: 60%, POSITIVE, STRONG STAINING INTENSITY Proliferation Marker Ki67: 3%  HER-2/NEU BY CISH - NEGATIVE   02/12/2015 Breast MRI Biopsy-proven malignancy in the central right breast at posterior depth. 2. No MR findings to suggest multicentric or contralateral malignancy.   02/13/2015 Initial Diagnosis Breast cancer of  lower-outer quadrant of right female breast   03/21/2015 Surgery Right breast lumpectomy and sentinel lymph node biopsy, surgical margins were negative.   03/21/2015 Pathologic Stage Invasive ductal carcinoma, DCIS, negative for lymphovascular invasion. One lymph node were negative. Grade 1, tumor size 1.1 cm, ER 100% positive, PR 73 positive, HER-2 negative, Ki67 6%.     HISTORY OF PRESENTING ILLNESS:  [name] 64 y.o. female is here because of newly diagnosed breast cancer.  This was discovered by screening mammogtram. Her last screening mammogram in February 2015 was normal.  The screening mammogram on 01/23/2015 showed a possible mass in the right breast. She underwent diagnostic mammogram and ultrasound on 02/03/2015, which showed a 5 x 5 x 5 mm irregular mass in the right breast 6:00 position.  Your biopsy of the mass showed invasive ductal carcinoma and DCIS.   She has multiple medical problems. She has chronic arthritis, with diffuse join pain, 8/10, she takes tylenol. No chest pain, (+) dyspena on moderate exertion, no nausea, abdominal bloating or change of her bowel habits. She is able to do most of her ADLs, limited light housework, not physically active, spends quite a bit of time sitting during the daytime.  She was hospitalized for UTI in 9/15 and went to rehab for a month. She has been using crane more regularly after that.   INTERIM HISTORY: She returns for follow-up. She underwent right breast lumpectomy and sentinel lymph nodes biopsy on 03/21/2015. She tolerated the surgery very well. No significant residual pain. She is back to her normal activity level, she uses a cane walk around, remains to be moderately  active. She has good appetite and eats well.  MEDICAL HISTORY:  Past Medical History  Diagnosis Date  . Hypertension   . Diabetes mellitus   . Depression   . Asthma   . Eczema   . Arthritis   . Cataract   . GERD (gastroesophageal reflux disease)   . History of  kidney stones     w/ hx of hydronephrosis - followed by [facility]  . Anemia   . Obesity   . HIV nonspecific serology     2006: indeterminate HIV blood test, seen by ID, felt secondary to cross reacting antibodies with no further workup felt necessary at that time   . Fatty liver     Fatty infiltration of liver noted on 03/2012 CT scan  . CHF, acute 05/03/2012  . Breast cancer of lower-outer quadrant of right female breast 02/13/2015   GYN HISTORY  Menarchal: 11 LMP: Several years ago Contraceptive: 1979-1986 HRT: NO G3P2:  SURGICAL HISTORY: Past Surgical History  Procedure Laterality Date  . Cystoscopy w/ litholapaxy / ehl    . Cholecystectomy  2003  . Replacement total knee bilateral  2005 &2006  . Joint replacement      bilateral knee replacement  . Vascular surgery Right 03/15/2013    Ultrasound guided sclerotherapy  . Left and right heart catheterization with coronary angiogram N/A 05/02/2012    Procedure: LEFT AND RIGHT HEART CATHETERIZATION WITH CORONARY ANGIOGRAM;  Surgeon: Burnell Blanks, MD;  Location: [facility] CATH LAB;  Service: Cardiovascular;  Laterality: N/A;  . Radioactive seed guided mastectomy with axillary sentinel lymph node biopsy Right 03/21/2015    Procedure: RIGHT  PARTIAL MASTECTOMY WITH RADIOACTIVE SEED LOCALIZATION RIGHT  AXILLARY SENTINEL LYMPH NODE BIOPSY;  Surgeon: Fanny Skates, MD;  Location: [facility];  Service: General;  Laterality: Right;    SOCIAL HISTORY: History   Social History  . Marital Status: Widowed     Spouse Name: [name]  . Number of Children: 3  . Years of Education: 14   Occupational History  . retired-CNA, housekeeping    Social History Main Topics  . Smoking status: Never Smoker   . Smokeless tobacco: Never Used  . Alcohol Use: No  . Drug Use: No  . Sexual Activity: No   Other Topics Concern  . Not on file   Social History Narrative   Health Care POA:    Emergency Contact: son, Aithana Kushner [phone]   End of Life Plan:    Who lives with you:  husband Darrall Dears- Adella is his primary care giver   Any pets: Hanahan   Diet: Pt has a varied diet.  Is currently working on smaller portions sizes and no late night snacking for weight loss.   Exercise: Pt exercises 1x weekly with church group.   Seatbelts: Pt reports wearing seatbelt when in vehicles.    Hobbies: word searches, church, time with family and friends             FAMILY HISTORY: Family History  Problem Relation Age of Onset  . Diabetes Mother   . Stroke Mother   . Heart disease Father   . Anemia Father   . Cancer Sister 17    nose cancer   . Cancer Cousin 30    ovarian cancer   . Cancer Cousin 27    ovarian cancer     ALLERGIES:  has No Known Allergies.  MEDICATIONS:  Current Outpatient Prescriptions  Medication  Sig Dispense Refill  . acetaminophen (TYLENOL) 500 MG tablet Take 1,000 mg by mouth 2 (two) times daily.    Marland Kitchen albuterol (PROVENTIL HFA;VENTOLIN HFA) 108 (90 BASE) MCG/ACT inhaler Inhale 2 puffs into the lungs every 6 (six) hours as needed for wheezing or shortness of breath.    Marland Kitchen aspirin 81 MG tablet Take 81 mg by mouth daily.    . benazepril (LOTENSIN) 20 MG tablet Take 1 tablet (20 mg total) by mouth daily. 90 tablet 3  . colchicine 0.6 MG tablet take 1 tablet by mouth twice a day 60 tablet 3  . FLOVENT HFA 110 MCG/ACT inhaler inhale 2 puffs by mouth twice a day 12 g 6  . furosemide (LASIX) 40 MG tablet take 1 tablet by mouth once daily 90 tablet 1  . gabapentin (NEURONTIN) 100 MG capsule Take 2 capsules (200 mg total) by mouth 3 (three) times daily. 270 capsule 3  . HYDROcodone-acetaminophen (NORCO) 5-325 MG per tablet Take 1-2 tablets by mouth every 6 (six) hours as needed for moderate pain or severe pain. 30 tablet 0  . metoprolol tartrate (LOPRESSOR) 25 MG tablet Take 1 tablet (25 mg total) by mouth 2 (two) times daily. 60 tablet 11  . potassium citrate (UROCIT-K) 10 MEQ (1080  MG) SR tablet take 1 tablet by mouth twice a day 60 tablet 6  . ranitidine (ZANTAC) 150 MG capsule Take 150 mg by mouth every evening.    Marland Kitchen spironolactone (ALDACTONE) 25 MG tablet TAKE 1 TABLET BY MOUTH ONCE A DAY 30 tablet 6  . trolamine salicylate (ASPERCREME) 10 % cream Apply 1 application topically as needed for muscle pain.      No current facility-administered medications for this visit.    REVIEW OF SYSTEMS:   Constitutional: Denies fevers, chills or abnormal night sweats Eyes: Denies blurriness of vision, double vision or watery eyes Ears, nose, mouth, throat, and face: Denies mucositis or sore throat Respiratory: Denies cough, dyspnea or wheezes Cardiovascular: Denies palpitation, chest discomfort or lower extremity swelling Gastrointestinal:  Denies nausea, heartburn or change in bowel habits Skin: Denies abnormal skin rashes Lymphatics: Denies new lymphadenopathy or easy bruising Neurological:Denies numbness, tingling or new weaknesses Behavioral/Psych: Mood is stable, no new changes  All other systems were reviewed with the patient and are negative.  PHYSICAL EXAMINATION: ECOG PERFORMANCE STATUS: 2 - Symptomatic, <50% confined to bed  Filed Vitals:   04/04/15 0927  BP: 142/76  Pulse: 69  Temp: 97.6 F (36.4 C)  Resp: 18   Filed Weights   04/04/15 0927  Weight: 231 lb 9.6 oz (105.053 kg)    GENERAL:alert, no distress and comfortable SKIN: skin color, texture, turgor are normal, no rashes or significant lesions EYES: normal, conjunctiva are pink and non-injected, sclera clear OROPHARYNX:no exudate, no erythema and lips, buccal mucosa, and tongue normal  NECK: supple, thyroid normal size, non-tender, without nodularity LYMPH:  no palpable lymphadenopathy in the cervical, axillary or inguinal LUNGS: clear to auscultation and percussion with normal breathing effort HEART: regular rate & rhythm and no murmurs and no lower extremity edema ABDOMEN:abdomen soft,  non-tender and normal bowel sounds Musculoskeletal:no cyanosis of digits and no clubbing  PSYCH: alert & oriented x 3 with fluent speech NEURO: no focal motor/sensory deficits Breasts: Breast inspection showed them to be symmetrical with no nipple discharge. Lumpectomy surgical scar in right breast is healing well, (+) breast deformity in the inferior right breast secondary to surgery. Mild discharge from the axillary lymph node biopsy site.  Palpation of left the breasts feels lumpy, but no discrete mass. Palpitation of axilla revealed no obvious mass that I could appreciate.   LABORATORY DATA:  I have reviewed the data as listed Lab Results  Component Value Date   WBC 9.1 02/19/2015   HGB 10.8* 02/19/2015   HCT 33.7* 02/19/2015   MCV 93.6 02/19/2015   PLT 176 02/19/2015    Recent Labs  07/23/14 0806 07/24/14 0700 07/25/14 0655  09/11/14 0944 09/18/14 1018 10/02/14 1019 01/22/15 1128 02/19/15 0831  NA 135* 139 139  --  141 142 139 141 142  K 4.6 4.2 4.7  --  5.0 4.7 4.5 4.7 4.6  CL 96 100 103  --  103 106 102 104  --   CO2 23 25 21   --  28 27 26 26 24   GLUCOSE 133* 174* 118*  --  138* 120* 99 114* 131  BUN 29* 28* 19  --  58* 39* 16 32* 33.6*  CREATININE 1.31* 1.20* 1.17*  < > 1.90* 1.59* 1.19* 1.38* 1.3*  CALCIUM 9.0 9.0 8.8  --  9.4  9.4 9.3 9.5 9.5 9.2  GFRNONAA 42* 46* 48*  --   --   --   --   --   --   GFRAA 48* 54* 55*  --   --   --   --   --   --   PROT 7.4 7.1 7.1  --  7.3  --   --  7.6 7.2  ALBUMIN 2.0* 2.0* 2.0*  --  3.8  --   --  4.2 3.7  AST 21 19 17   --  14  --   --  16 15  ALT 11 13 12   --  10  --   --  20 21  ALKPHOS 157* 164* 139*  --  73  --   --  119* 139  BILITOT 1.3* 0.7 0.4  --  0.5  --   --  0.6 0.42  < > = values in this interval not displayed.  Pathology 03/21/2015 Diagnosis 1. Breast, lumpectomy, right - INVASIVE DUCTAL CARCINOMA, SEE COMMENT. - DUCTAL CARCINOMA IN SITU. - NEGATIVE FOR LYMPH VASCULAR INVASION. - PREVIOUS BIOPSY SITE  IDENTIFIED. - SEE TUMOR SYNOPTIC TEMPLATE BELOW. 2. Lymph node, sentinel, biopsy, right axillary - ONE LYMPH NODE, NEGATIVE FOR TUMOR (0/1).  1. BREAST, INVASIVE TUMOR, WITH LYMPH NODES PRESENT Specimen, including laterality and lymph node sampling (sentinel, non-sentinel): Right breast with sentinel lymph node sampling. Procedure: Lumpectomy. Histologic type: Grade: I of III Tubule formation: 1 Nuclear pleomorphism: 2 Mitotic:1 Tumor size (gross measurement): 1.1 cm Margins: Invasive, distance to closest margin: 0.9 cm (medial). In-situ, distance to closest margin: Same as invasive. If margin positive, focally or broadly: N/A Lymphovascular invasion: Absent. Ductal carcinoma in situ: Present. Grade: I of III Extensive intraductal component: Absent. Lobular neoplasia: Absent. Tumor focality: Unifocal. Treatment effect: None. If present, treatment effect in breast tissue, lymph nodes or both: N/A Extent of tumor: Skin: N/A Nipple: N/A Skeletal muscle: N/A 2 of 4 FINAL for ANABELA, CRAYTON ([id number]) Microscopic Comment(continued) Lymph nodes: Examined: 1 Sentinel 0 Non-sentinel 1 Total Lymph nodes with metastasis: 0 Isolated tumor cells (< 0.2 mm): N/A Micrometastasis: (> 0.2 mm and < 2.0 mm): N/A Macrometastasis: (> 2.0 mm): N/A Extracapsular extension: N/A Breast prognostic profile: Estrogen receptor: Not repeated, previous study demonstrated 100% positivity ([id number]). Progesterone receptor: Not repeated, previous study demonstrated 60% positivity ([id number]). Her 2 neu: Repeated, previous  study demonstrated no amplification (1.70) ([id number]). Ki-67: Not repeated, previous study demonstrated 3% proliferation rate ([id number]). Non-neoplastic breast: Previous biopsy site related tissue changes. TNM: pT1c, pN0, pMX  Results: IMMUNOHISTOCHEMICAL AND MORPHOMETRIC ANALYSIS BY THE AUTOMATED CELLULAR IMAGING SYSTEM (ACIS) Estrogen Receptor: 100%, POSITIVE, STRONG  STAINING INTENSITY Progesterone Receptor: 73%, POSITIVE, STRONG STAINING INTENSITY Proliferation Marker Ki67: 6%  1. CHROMOGENIC IN-SITU HYBRIDIZATION Results: HER-2/NEU BY CISH - NEGATIVE. RESULT RATIO OF HER2: CEP 17 SIGNALS 1.30 AVERAGE HER2 COPY NUMBER PER CELL 1.95   RADIOGRAPHIC STUDIES: I have personally reviewed the radiological images as listed and agreed with the findings in the report.  Mr Breast Bilateral W Wo Contrast 02/13/2015    FINDINGS: Breast composition: Scattered fibroglandular densities.  Background parenchymal enhancement: The multiplanar, multi sequence bilateral breast MR demonstrates moderate background parenchymal enhancement with scattered foci of non mass enhancement bilaterally with benign MR features.  Right breast: A focus of metallic clip artifact is identified in the central right breast at posterior depth denoting site of biopsy-proven disease with mild associated post biopsy sequela. At the site of biopsy, examination demonstrates a small round homogeneously enhancing mass with irregular margins measuring approximately 6 x 6 x 6 mm consistent with a residual disease. No additional suspicious mass or enhancement to suggest multifocal or multicentric disease.  Left breast: Scattered foci of non mass enhancement with benign MR features. No abnormal mass, suspicious enhancement, or morphologic abnormality to suggest contralateral disease.  Lymph nodes: The axillary and internal mammary lymph node chains are symmetric and unremarkable.  Ancillary findings:  None.   IMPRESSION: 1. Biopsy-proven malignancy in the central right breast at posterior depth. 2. No MR findings to suggest multicentric or contralateral malignancy. 3. No evidence of adenopathy.  RECOMMENDATION: Continued surgical management.  BI-RADS CATEGORY  6: Known biopsy-proven malignancy.   Electronically Signed   By: Andres Shad   On: 02/13/2015 10:44     02/03/2015 US breast  Targeted ultrasound is  performed, showing a 5 x 5 x 5 mm irregular hypoechoic mass with internal color vascularity within the right breast 6 o'clock position 4 cm from the nipple. No right axillary lymphadenopathy.  IMPRESSION: Suspicious right breast mass.  01/23/2015  Mammogram  FINDINGS: In the right breast, a possible mass warrants further evaluation with spot compression views and possibly ultrasound. In the left breast, no findings suspicious for malignancy. Images were processed with CAD.  IMPRESSION: Further evaluation is suggested for possible mass in the right breast.  RECOMMENDATION: Diagnostic mammogram and possibly ultrasound of the right breast.   ASSESSMENT & PLAN:  64 year old postmenopausal African-American American female, screening detected right breast invasive ductal carcinoma and DCIS.  1. pT1c N0 M0, stage IA right breast invasive ductal carcinoma, ER positive PR positive HER-2 negative, Ki 67 6%, and DCIS -I discussed her imaging findings and surgical pathology results with her and her daughters in great details. -She has early-stage breast cancer, likely cured by the complete surgical resection. -She would benefit from adjuvant endocrine therapy, with a aromatase inhibitor or tamoxifen. Given her significant arthritis, she may not tolerate aromatase inhibitor well and tamoxifen would be a reasonable option. -She was also seen by radiation oncologist [name] and a plan to have adjuvant breast radiation -I discussed the role of adjuvant chemotherapy in early stage breast cancer. Giving the T1c tumor size, I recommend to obtain a Oncotype DX test to predict the risk of recurrence and benefit of chemotherapy. She agrees to proceed. We will send out today.  2. Hypertension, diabetes, arthritis, CHF -She will continue follow-up with her primary care physician  Follow up: -I'll see her back in 2 weeks to discuss her Oncotype DX results.  All questions were answered. The  patient knows to call the clinic with any problems, questions or concerns. I spent 20 minutes counseling the patient face to face. The total time spent in the appointment was 25 minutes and more than 50% was on counseling.     Truitt Merle, MD 04/05/2015 12:31 PM

## 2015-04-05 ENCOUNTER — Encounter: Payer: Self-pay | Admitting: Hematology

## 2015-04-07 ENCOUNTER — Telehealth: Payer: Self-pay | Admitting: *Deleted

## 2015-04-07 NOTE — Telephone Encounter (Signed)
Received onctoype order from [name]. Requisition sent to pathology. Received by Alyse Low.

## 2015-04-08 ENCOUNTER — Other Ambulatory Visit: Payer: Self-pay | Admitting: *Deleted

## 2015-04-09 ENCOUNTER — Telehealth: Payer: Self-pay | Admitting: Hematology

## 2015-04-09 ENCOUNTER — Telehealth: Payer: Self-pay | Admitting: *Deleted

## 2015-04-09 NOTE — Telephone Encounter (Signed)
TC from pt requesting a change to her next appt with [name]. Connected pt to scheduler.

## 2015-04-09 NOTE — Telephone Encounter (Signed)
per pof to sch to sch pt appt-cld & spoke to pt and gave pt time & dtae of appt

## 2015-04-11 ENCOUNTER — Telehealth: Payer: Self-pay | Admitting: *Deleted

## 2015-04-11 NOTE — Telephone Encounter (Signed)
VM message from patient stating she cannot keep appt on 04/18/15 for labs and [name] as she will be out of town. Please re-schedule

## 2015-04-15 ENCOUNTER — Telehealth: Payer: Self-pay | Admitting: Hematology

## 2015-04-15 DIAGNOSIS — E119 Type 2 diabetes mellitus without complications: Secondary | ICD-10-CM | POA: Diagnosis not present

## 2015-04-15 DIAGNOSIS — J45909 Unspecified asthma, uncomplicated: Secondary | ICD-10-CM | POA: Diagnosis not present

## 2015-04-15 DIAGNOSIS — F329 Major depressive disorder, single episode, unspecified: Secondary | ICD-10-CM | POA: Diagnosis not present

## 2015-04-15 DIAGNOSIS — Z9181 History of falling: Secondary | ICD-10-CM | POA: Diagnosis not present

## 2015-04-15 DIAGNOSIS — C50511 Malignant neoplasm of lower-outer quadrant of right female breast: Secondary | ICD-10-CM | POA: Diagnosis not present

## 2015-04-15 DIAGNOSIS — E669 Obesity, unspecified: Secondary | ICD-10-CM | POA: Diagnosis not present

## 2015-04-15 DIAGNOSIS — I10 Essential (primary) hypertension: Secondary | ICD-10-CM | POA: Diagnosis not present

## 2015-04-15 DIAGNOSIS — I509 Heart failure, unspecified: Secondary | ICD-10-CM | POA: Diagnosis not present

## 2015-04-15 NOTE — Telephone Encounter (Signed)
pt cld to r/s appt-gave pt updated appt time & date

## 2015-04-15 NOTE — Telephone Encounter (Signed)
Faxed pt medical records to  [facility]

## 2015-04-16 DIAGNOSIS — C50919 Malignant neoplasm of unspecified site of unspecified female breast: Secondary | ICD-10-CM | POA: Diagnosis not present

## 2015-04-17 ENCOUNTER — Encounter (HOSPITAL_COMMUNITY): Payer: Self-pay

## 2015-04-18 ENCOUNTER — Ambulatory Visit: Payer: Medicare Other | Admitting: Hematology

## 2015-04-18 ENCOUNTER — Other Ambulatory Visit: Payer: Medicare Other

## 2015-04-20 ENCOUNTER — Telehealth: Payer: Self-pay | Admitting: Hematology

## 2015-04-20 ENCOUNTER — Other Ambulatory Visit: Payer: Self-pay | Admitting: Hematology

## 2015-04-20 NOTE — Telephone Encounter (Signed)
I called pt to discuss her Oncotype test result. The RS is 7, which is low risk, and I would not recommend adjuvant chemo. She will proceed with adjuvant radiation then. I will send a message to [name]. I will cancer her 6/9 appointment with me and see her back when she completes her radiation therapy.   [name]  04/20/2015

## 2015-04-21 ENCOUNTER — Telehealth: Payer: Self-pay | Admitting: Hematology

## 2015-04-21 NOTE — Telephone Encounter (Signed)
s.w. pt and advise don 6.9 appt cx and moved to 7.1.Marland KitchenMarland KitchenMarland Kitchenpt oko and aware

## 2015-04-22 ENCOUNTER — Ambulatory Visit: Payer: Medicare Other | Admitting: Hematology

## 2015-04-22 ENCOUNTER — Other Ambulatory Visit: Payer: Medicare Other

## 2015-04-22 NOTE — Telephone Encounter (Signed)
Please let Ms. Betton know she can call to schedule her simulation [phone]

## 2015-04-29 ENCOUNTER — Ambulatory Visit
Admission: RE | Admit: 2015-04-29 | Discharge: 2015-04-29 | Disposition: A | Discharge status: Home / Self Care | Payer: Medicare Other | Source: Ambulatory Visit | Attending: Radiation Oncology | Admitting: Radiation Oncology

## 2015-04-29 ENCOUNTER — Ambulatory Visit (INDEPENDENT_AMBULATORY_CARE_PROVIDER_SITE_OTHER): Payer: Medicare Other | Admitting: Podiatry

## 2015-04-29 ENCOUNTER — Ambulatory Visit: Payer: Medicaid Other

## 2015-04-29 ENCOUNTER — Encounter: Payer: Self-pay | Admitting: Podiatry

## 2015-04-29 DIAGNOSIS — E1169 Type 2 diabetes mellitus with other specified complication: Secondary | ICD-10-CM | POA: Diagnosis not present

## 2015-04-29 DIAGNOSIS — C50511 Malignant neoplasm of lower-outer quadrant of right female breast: Secondary | ICD-10-CM | POA: Diagnosis not present

## 2015-04-29 DIAGNOSIS — M21969 Unspecified acquired deformity of unspecified lower leg: Secondary | ICD-10-CM

## 2015-04-29 DIAGNOSIS — B351 Tinea unguium: Secondary | ICD-10-CM | POA: Diagnosis not present

## 2015-04-29 DIAGNOSIS — M79673 Pain in unspecified foot: Secondary | ICD-10-CM | POA: Diagnosis not present

## 2015-04-29 NOTE — Progress Notes (Signed)
Name: [name]   MRN: [id number]  Date:  04/29/2015  DOB: [birth year]  Status:outpatient    DIAGNOSIS: Breast cancer.  CONSENT VERIFIED: yes   SET UP: Patient is setup supine   IMMOBILIZATION:  The following immobilization was used:Custom Moldable Pillow, breast board.   NARRATIVE: [name] was brought to the [facility].  Identity was confirmed.  All relevant records and images related to the planned course of therapy were reviewed.  Then, the patient was positioned in a stable reproducible clinical set-up for radiation therapy.  Wires were placed to delineate the clinical extent of breast tissue. A wire was placed on the scar as well.  CT images were obtained.  An isocenter was placed. Skin markings were placed.  The CT images were loaded into the planning software where the target and avoidance structures were contoured.  The radiation prescription was entered and confirmed. The patient was discharged in stable condition and tolerated simulation well.    TREATMENT PLANNING NOTE:  Treatment planning then occurred. I have requested : MLC's, isodose plan, basic dose calculation  I personally designed and supervised the construction of 3 medically necessary complex treatment devices for the protection of critical normal structures including the lungs and contralateral breast as well as the immobilization device which is necessary for set up certainty.   3D simulation occurred. I requested and analyzed a dose volume histogram of the heart, lungs and lumpectomy cavity.

## 2015-04-29 NOTE — Progress Notes (Signed)
Patient ID: [name], female   DOB: 1951-10-24, 64 y.o.   MRN: [id number] Complaint:  Visit Type: Patient returns to my office for continued preventative foot care services. Complaint: Patient states" my nails have grown long and thick and become painful to walk and wear shoes" Patient has been diagnosed with DM with no complications. He presents for preventative foot care services. No changes to ROS  Podiatric Exam: Vascular: dorsalis pedis and posterior tibial pulses are palpable bilateral. Capillary return is immediate. Temperature gradient is WNL. Skin turgor WNL  Sensorium: Normal Semmes Weinstein monofilament test. Normal tactile sensation bilaterally. Nail Exam: Pt has thick disfigured discolored nails with subungual debris noted bilateral entire nail hallux through fifth toenails Ulcer Exam: There is no evidence of ulcer or pre-ulcerative changes or infection. Orthopedic Exam: Muscle tone and strength are WNL. No limitations in general ROM. No crepitus or effusions noted. Foot type and digits show no abnormalities. Bony prominences are unremarkable. Asymptomatic HAV B/L. Skin: No Porokeratosis. No infection or ulcers  Diagnosis:  Tinea unguium, , pain in left toes  Treatment & Plan Procedures and Treatment: Consent by patient was obtained for treatment procedures. The patient understood the discussion of treatment and procedures well. All questions were answered thoroughly reviewed. Debridement of mycotic and hypertrophic toenails, 1 through 5 bilateral and clearing of subungual debris. No ulceration, no infection noted.  Return Visit-Office Procedure: Patient instructed to return to the office for a follow up visit 3 months for continued evaluation and treatment.

## 2015-04-29 NOTE — Progress Notes (Signed)
Radiation Oncology         [phone] ________________________________  Name: [name]      MRN: [id number]          Date: 04/29/2015              DOB: 05/06/1951  Optical Surface Tracking Plan:  Since intensity modulated radiotherapy (IMRT) and 3D conformal radiation treatment methods are predicated on accurate and precise positioning for treatment, intrafraction motion monitoring is medically necessary to ensure accurate and safe treatment delivery.  The ability to quantify intrafraction motion without excessive ionizing radiation dose can only be performed with optical surface tracking. Accordingly, surface imaging offers the opportunity to obtain 3D measurements of patient position throughout IMRT and 3D treatments without excessive radiation exposure.  I am ordering optical surface tracking for this patient's upcoming course of radiotherapy. ________________________________ Signature   Reference:   Ursula Alert, J, et al. Surface imaging-based analysis of intrafraction motion for breast radiotherapy patients.Journal of [place], n. 6, nov. 2014. ISSN [id number].   Available at: <http://www.jacmp.org/index.php/jacmp/article/view/4957>.

## 2015-05-01 DIAGNOSIS — H2513 Age-related nuclear cataract, bilateral: Secondary | ICD-10-CM | POA: Diagnosis not present

## 2015-05-01 DIAGNOSIS — H538 Other visual disturbances: Secondary | ICD-10-CM | POA: Diagnosis not present

## 2015-05-01 DIAGNOSIS — E0865 Diabetes mellitus due to underlying condition with hyperglycemia: Secondary | ICD-10-CM | POA: Diagnosis not present

## 2015-05-01 DIAGNOSIS — C50511 Malignant neoplasm of lower-outer quadrant of right female breast: Secondary | ICD-10-CM | POA: Diagnosis not present

## 2015-05-01 LAB — HM DIABETES EYE EXAM

## 2015-05-02 ENCOUNTER — Other Ambulatory Visit: Payer: Self-pay | Admitting: Family Medicine

## 2015-05-02 MED ORDER — GABAPENTIN 100 MG PO CAPS: 200.0000 mg | ORAL_CAPSULE | Freq: Three times a day (TID) | ORAL | Status: DC

## 2015-05-02 NOTE — Telephone Encounter (Signed)
Pt says she wasn't given enough gabapentin to get her through the whole month, she ran out last night, wants to know if she can get a refill and to up the dosage to get her through the whole month? Pt goes to [facility]/[address]

## 2015-05-05 ENCOUNTER — Telehealth: Payer: Self-pay | Admitting: Hematology

## 2015-05-05 NOTE — Telephone Encounter (Signed)
Due to GI [facility] switch 7/1 f/u moved from [facility] to [facility] [facility] per [facility] [facility] [place]. Spoke patient she is aware and has new time for lab/AJ on 7/1 @ 10:15am.

## 2015-05-06 ENCOUNTER — Ambulatory Visit
Admission: RE | Admit: 2015-05-06 | Discharge: 2015-05-06 | Disposition: A | Discharge status: Home / Self Care | Payer: Medicare Other | Source: Ambulatory Visit | Attending: Radiation Oncology | Admitting: Radiation Oncology

## 2015-05-06 DIAGNOSIS — C50511 Malignant neoplasm of lower-outer quadrant of right female breast: Secondary | ICD-10-CM | POA: Diagnosis not present

## 2015-05-07 ENCOUNTER — Ambulatory Visit
Admission: RE | Admit: 2015-05-07 | Discharge: 2015-05-07 | Disposition: A | Discharge status: Home / Self Care | Payer: Medicare Other | Source: Ambulatory Visit | Attending: Radiation Oncology | Admitting: Radiation Oncology

## 2015-05-07 DIAGNOSIS — C50511 Malignant neoplasm of lower-outer quadrant of right female breast: Secondary | ICD-10-CM | POA: Diagnosis not present

## 2015-05-08 ENCOUNTER — Ambulatory Visit
Admission: RE | Admit: 2015-05-08 | Discharge: 2015-05-08 | Disposition: A | Discharge status: Home / Self Care | Payer: Medicare Other | Source: Ambulatory Visit | Attending: Radiation Oncology | Admitting: Radiation Oncology

## 2015-05-08 ENCOUNTER — Ambulatory Visit: Payer: Medicare Other | Admitting: Hematology

## 2015-05-08 ENCOUNTER — Other Ambulatory Visit: Payer: Medicare Other

## 2015-05-08 DIAGNOSIS — C50511 Malignant neoplasm of lower-outer quadrant of right female breast: Secondary | ICD-10-CM | POA: Diagnosis not present

## 2015-05-09 ENCOUNTER — Ambulatory Visit
Admission: RE | Admit: 2015-05-09 | Discharge: 2015-05-09 | Disposition: A | Discharge status: Home / Self Care | Payer: Medicare Other | Source: Ambulatory Visit | Attending: Radiation Oncology | Admitting: Radiation Oncology

## 2015-05-09 DIAGNOSIS — C50511 Malignant neoplasm of lower-outer quadrant of right female breast: Secondary | ICD-10-CM | POA: Diagnosis not present

## 2015-05-12 ENCOUNTER — Telehealth: Payer: Self-pay | Admitting: *Deleted

## 2015-05-12 ENCOUNTER — Ambulatory Visit
Admission: RE | Admit: 2015-05-12 | Discharge: 2015-05-12 | Disposition: A | Discharge status: Home / Self Care | Payer: Medicare Other | Source: Ambulatory Visit | Attending: Radiation Oncology | Admitting: Radiation Oncology

## 2015-05-12 DIAGNOSIS — C50511 Malignant neoplasm of lower-outer quadrant of right female breast: Secondary | ICD-10-CM | POA: Diagnosis not present

## 2015-05-12 NOTE — Telephone Encounter (Signed)
Called pt to assess needs during xrt.  Relate she is doing well and without complaints. Encourage pt to call with questions or concerns. Received verbal understanding.

## 2015-05-13 ENCOUNTER — Encounter: Payer: Self-pay | Admitting: Radiation Oncology

## 2015-05-13 ENCOUNTER — Ambulatory Visit
Admission: RE | Admit: 2015-05-13 | Discharge: 2015-05-13 | Disposition: A | Discharge status: Home / Self Care | Payer: Medicare Other | Source: Ambulatory Visit | Attending: Radiation Oncology | Admitting: Radiation Oncology

## 2015-05-13 VITALS — BP 112/66 | HR 62 | Temp 97.9°F | Ht 62.0 in | Wt 230.0 lb

## 2015-05-13 DIAGNOSIS — C50511 Malignant neoplasm of lower-outer quadrant of right female breast: Secondary | ICD-10-CM | POA: Diagnosis not present

## 2015-05-13 MED ORDER — ALRA NON-METALLIC DEODORANT (RAD-ONC)
1.0000 "application " | Freq: Once | TOPICAL | Status: AC
  Administered 2015-05-13: 1 via TOPICAL

## 2015-05-13 MED ORDER — RADIAPLEXRX EX GEL
Freq: Once | CUTANEOUS | Status: AC
  Administered 2015-05-13: 12:00:00 via TOPICAL

## 2015-05-13 NOTE — Progress Notes (Addendum)
[name] has received 5 fractions to her right breast.  Note mild swelling and redness near the areola region with hyperpigmentation in the inframmary fold.  Reports level 3/10 tendernees to her right lateral breast.  Skin soft and intact. BP 112/66 mmHg  Pulse 62  Temp(Src) 97.9 F (36.6 C)  Ht 5\' 2"  (1.575 m)  Wt 230 lb (104.327 kg)  BMI 42.06 kg/m2    Pt here for patient teaching.  Pt given Radiation and You booklet, skin care instructions, Alra deodorant and Radiaplex gel. Reviewed areas of pertinence such as fatigue, skin changes, breast tenderness and breast swelling . Pt able to give teach back of to pat skin, use unscented/gentle soap and drink plenty of water,apply Radiaplex bid, avoid applying anything to skin within 4 hours of treatment, avoid wearing an under wire bra and to use an electric razor if they must shave. Pt demonstrated understanding and verbalizes understanding of information given and will contact nursing with any questions or concerns. Teachback  Given Val Malloy's and this RN's business card. Informed that [name] will see her every Tuesday following her treatment, but if this changes she will be notified in advance.  She stated understanding.

## 2015-05-13 NOTE — Progress Notes (Signed)
Weekly Management Note Current Dose:  9 Gy  Projected Dose: 61 Gy   Narrative:  The patient presents for routine under treatment assessment.  CBCT/MVCT images/Port film x-rays were reviewed.  The chart was checked. Doing well. No complaints. RN education performed.   Physical Findings: Weight: 230 lb (104.327 kg). Unchanged  Impression:  The patient is tolerating radiation.  Plan:  Continue treatment as planned. Start radiaplex.

## 2015-05-14 ENCOUNTER — Ambulatory Visit
Admission: RE | Admit: 2015-05-14 | Discharge: 2015-05-14 | Disposition: A | Discharge status: Home / Self Care | Payer: Medicare Other | Source: Ambulatory Visit | Attending: Radiation Oncology | Admitting: Radiation Oncology

## 2015-05-14 ENCOUNTER — Ambulatory Visit: Payer: Medicare Other | Admitting: Family Medicine

## 2015-05-14 DIAGNOSIS — C50511 Malignant neoplasm of lower-outer quadrant of right female breast: Secondary | ICD-10-CM | POA: Diagnosis not present

## 2015-05-15 ENCOUNTER — Ambulatory Visit
Admission: RE | Admit: 2015-05-15 | Discharge: 2015-05-15 | Disposition: A | Discharge status: Home / Self Care | Payer: Medicare Other | Source: Ambulatory Visit | Attending: Radiation Oncology | Admitting: Radiation Oncology

## 2015-05-15 DIAGNOSIS — C50511 Malignant neoplasm of lower-outer quadrant of right female breast: Secondary | ICD-10-CM | POA: Diagnosis not present

## 2015-05-16 ENCOUNTER — Ambulatory Visit
Admission: RE | Admit: 2015-05-16 | Discharge: 2015-05-16 | Disposition: A | Discharge status: Home / Self Care | Payer: Medicare Other | Source: Ambulatory Visit | Attending: Radiation Oncology | Admitting: Radiation Oncology

## 2015-05-16 DIAGNOSIS — C50511 Malignant neoplasm of lower-outer quadrant of right female breast: Secondary | ICD-10-CM | POA: Diagnosis not present

## 2015-05-19 ENCOUNTER — Ambulatory Visit
Admission: RE | Admit: 2015-05-19 | Discharge: 2015-05-19 | Disposition: A | Discharge status: Home / Self Care | Payer: Medicare Other | Source: Ambulatory Visit | Attending: Radiation Oncology | Admitting: Radiation Oncology

## 2015-05-19 DIAGNOSIS — C50511 Malignant neoplasm of lower-outer quadrant of right female breast: Secondary | ICD-10-CM | POA: Diagnosis not present

## 2015-05-20 ENCOUNTER — Ambulatory Visit
Admission: RE | Admit: 2015-05-20 | Discharge: 2015-05-20 | Disposition: A | Discharge status: Home / Self Care | Payer: Medicare Other | Source: Ambulatory Visit | Attending: Radiation Oncology | Admitting: Radiation Oncology

## 2015-05-20 VITALS — BP 129/75 | HR 63 | Temp 97.8°F | Wt 231.7 lb

## 2015-05-20 DIAGNOSIS — C50511 Malignant neoplasm of lower-outer quadrant of right female breast: Secondary | ICD-10-CM | POA: Diagnosis not present

## 2015-05-20 NOTE — Progress Notes (Signed)
Weekly assessment of radiation to right breast.Completed 10 of 33 treatments.Denies pain.Skin with mild tanning.Axillary scar tissue is palpable.Informed nothing to worry about.

## 2015-05-20 NOTE — Progress Notes (Signed)
Weekly Management Note Current Dose:  18 Gy  Projected Dose: 61 Gy   Narrative:  The patient presents for routine under treatment assessment.  CBCT/MVCT images/Port film x-rays were reviewed.  The chart was checked. Doing well. Feels scar tissue/keloid in right axilla along scar.   Physical Findings: Weight: 231 lb 11.2 oz (105.098 kg). Unchanged mild tanning of right breast.   Impression:  The patient is tolerating radiation.  Plan:  Continue treatment as planned. Continue radiaplex.

## 2015-05-21 ENCOUNTER — Ambulatory Visit (INDEPENDENT_AMBULATORY_CARE_PROVIDER_SITE_OTHER): Payer: Medicare Other | Admitting: Home Health Services

## 2015-05-21 ENCOUNTER — Encounter: Payer: Self-pay | Admitting: Home Health Services

## 2015-05-21 ENCOUNTER — Ambulatory Visit
Admission: RE | Admit: 2015-05-21 | Discharge: 2015-05-21 | Disposition: A | Discharge status: Home / Self Care | Payer: Medicare Other | Source: Ambulatory Visit | Attending: Radiation Oncology | Admitting: Radiation Oncology

## 2015-05-21 VITALS — BP 93/65 | HR 69 | Temp 98.5°F | Ht 62.0 in | Wt 228.0 lb

## 2015-05-21 DIAGNOSIS — Z23 Encounter for immunization: Secondary | ICD-10-CM

## 2015-05-21 DIAGNOSIS — Z0000 Encounter for general adult medical examination without abnormal findings: Secondary | ICD-10-CM

## 2015-05-21 DIAGNOSIS — C50511 Malignant neoplasm of lower-outer quadrant of right female breast: Secondary | ICD-10-CM | POA: Diagnosis not present

## 2015-05-21 NOTE — Progress Notes (Signed)
Subjective:   [name] is a 64 y.o. female who presents for Medicare Annual (Subsequent) preventive examination.     Objective:     Vitals: BP 93/65 mmHg  Pulse 69  Temp(Src) 98.5 F (36.9 C) (Oral)  Ht 5\' 2"  (1.575 m)  Wt 228 lb (103.42 kg)  BMI 41.69 kg/m2  Tobacco History  Smoking status  . Never Smoker   Smokeless tobacco  . Never Used     Counseling given: Not Answered   Past Medical History  Diagnosis Date  . Hypertension   . Diabetes mellitus   . Depression   . Asthma   . Eczema   . Arthritis   . Cataract   . GERD (gastroesophageal reflux disease)   . History of kidney stones     w/ hx of hydronephrosis - followed by [facility]  . Anemia   . Obesity   . HIV nonspecific serology     2006: indeterminate HIV blood test, seen by ID, felt secondary to cross reacting antibodies with no further workup felt necessary at that time   . Fatty liver     Fatty infiltration of liver noted on 03/2012 CT scan  . CHF, acute 05/03/2012  . Breast cancer of lower-outer quadrant of right female breast 02/13/2015   Past Surgical History  Procedure Laterality Date  . Cystoscopy w/ litholapaxy / ehl    . Cholecystectomy  2003  . Replacement total knee bilateral  2005 &2006  . Joint replacement      bilateral knee replacement  . Vascular surgery Right 03/15/2013    Ultrasound guided sclerotherapy  . Left and right heart catheterization with coronary angiogram N/A 05/02/2012    Procedure: LEFT AND RIGHT HEART CATHETERIZATION WITH CORONARY ANGIOGRAM;  Surgeon: Burnell Blanks, MD;  Location: [facility] CATH LAB;  Service: Cardiovascular;  Laterality: N/A;  . Radioactive seed guided mastectomy with axillary sentinel lymph node biopsy Right 03/21/2015    Procedure: RIGHT  PARTIAL MASTECTOMY WITH RADIOACTIVE SEED LOCALIZATION RIGHT  AXILLARY SENTINEL LYMPH NODE BIOPSY;  Surgeon: Fanny Skates, MD;  Location: [place];  Service: General;  Laterality:  Right;   Family History  Problem Relation Age of Onset  . Diabetes Mother   . Stroke Mother   . Heart disease Father   . Anemia Father   . Cancer Sister 17    nose cancer   . Cancer Cousin 30    ovarian cancer   . Cancer Cousin 49    ovarian cancer    History  Sexual Activity  . Sexual Activity: No    Outpatient Encounter Prescriptions as of 05/21/2015  Medication Sig  . acetaminophen (TYLENOL) 500 MG tablet Take 1,000 mg by mouth 2 (two) times daily.  Marland Kitchen albuterol (PROVENTIL HFA;VENTOLIN HFA) 108 (90 BASE) MCG/ACT inhaler Inhale 2 puffs into the lungs every 6 (six) hours as needed for wheezing or shortness of breath.  Marland Kitchen aspirin 81 MG tablet Take 81 mg by mouth daily.  . benazepril (LOTENSIN) 20 MG tablet Take 1 tablet (20 mg total) by mouth daily.  . colchicine 0.6 MG tablet take 1 tablet by mouth twice a day  . FLOVENT HFA 110 MCG/ACT inhaler inhale 2 puffs by mouth twice a day  . furosemide (LASIX) 40 MG tablet take 1 tablet by mouth once daily  . gabapentin (NEURONTIN) 100 MG capsule Take 2 capsules (200 mg total) by mouth 3 (three) times daily.  Marland Kitchen HYDROcodone-acetaminophen (NORCO) 5-325 MG  per tablet Take 1-2 tablets by mouth every 6 (six) hours as needed for moderate pain or severe pain.  . metoprolol tartrate (LOPRESSOR) 25 MG tablet Take 1 tablet (25 mg total) by mouth 2 (two) times daily.  . potassium citrate (UROCIT-K) 10 MEQ (1080 MG) SR tablet take 1 tablet by mouth twice a day  . ranitidine (ZANTAC) 150 MG tablet   . spironolactone (ALDACTONE) 25 MG tablet TAKE 1 TABLET BY MOUTH ONCE A DAY  . trolamine salicylate (ASPERCREME) 10 % cream Apply 1 application topically as needed for muscle pain.    No facility-administered encounter medications on file as of 05/21/2015.    Activities of Daily Living In your present state of health, do you have any difficulty performing the following activities: 05/21/2015 03/21/2015  Hearing? N N  Vision? N N  Difficulty concentrating  or making decisions? N N  Walking or climbing stairs? Y Y  Dressing or bathing? N N  Doing errands, shopping? N -    Patient Care Team: Lind Covert, MD as PCP - General (Family Medicine) Harriet Masson, DPM (Podiatry) Gevena Cotton, MD (Ophthalmology) Rana Snare, MD (Urology) Dr. Edwin Cap ([place]) Fanny Skates, MD as Consulting Physician (General Surgery) Thea Silversmith, MD as Consulting Physician (Radiation Oncology) Mauro Kaufmann, RN as Registered Nurse Rockwell Germany, RN as Registered Nurse Holley Bouche, NP as Nurse Practitioner (Nurse Practitioner) Truitt Merle, MD as Consulting Physician (Hematology)    Assessment:     Exercise Activities and Dietary recommendations Continue with 3-4 small meals throughout day and daily walks.     Goals    None     Fall Risk Fall Risk  05/21/2015 05/13/2015 04/03/2015 03/05/2015 01/22/2015  Falls in the past year? Yes Yes Yes No Yes  Number falls in past yr: 1 1 2  or more - 1  Injury with Fall? No No No - No  Risk Factor Category  - - High Fall Risk - -  Risk for fall due to : Impaired mobility - History of fall(s);Impaired balance/gait;Impaired mobility - -  Risk for fall due to (comments): - - - - -   Depression Screen PHQ 2/9 Scores 05/21/2015 05/13/2015 04/03/2015 03/05/2015  PHQ - 2 Score 0 0 0 0  PHQ- 9 Score - - - -     Cognitive Testing MMSE - Mini Mental State Exam 05/21/2015 01/18/2014 04/26/2013 04/03/2012  Orientation to time 5 5 5 5   Orientation to Place 5 5 5 5   Registration 3 3 3 3   Attention/ Calculation 5 5 5 5   Recall 3 3 1 3   Language- name 2 objects 2 2 2 2   Language- repeat 1 1 1 1   Language- follow 3 step command 3 3 3 3   Language- read & follow direction 1 1 1 1   Write a sentence 1 1 1 1   Copy design 1 1 1 1   Total score 30 30 28 30     Immunization History  Administered Date(s) Administered  . Influenza Split 08/17/2011, 08/23/2012  . Influenza Whole 09/12/2009, 12/14/2010    . Influenza,inj,Quad PF,36+ Mos 08/27/2013, 09/02/2014  . Pneumococcal Conjugate-13 05/21/2015  . Pneumococcal Polysaccharide-23 04/30/2012  . Td 07/14/2009  . Zoster 06/02/2012   Screening Tests Health Maintenance  Topic Date Due  . Hepatitis C Screening  1951-05-01  . DEXA SCAN  02/07/2014  . PNA vac Low Risk Adult (1 of 2 - PCV13) 02/07/2014  . OPHTHALMOLOGY EXAM  09/07/2014  . FOOT  EXAM  12/31/2014  . INFLUENZA VACCINE  06/30/2015  . HEMOGLOBIN A1C  07/23/2015  . MAMMOGRAM  02/09/2017  . TETANUS/TDAP  07/15/2019  . COLONOSCOPY  10/03/2021  . ZOSTAVAX  Completed     Foot Exam completed.  Plan:   During the course of the visit the patient was educated and counseled about the following appropriate screening and preventive services:   Vaccines to include Pneumoccal, Influenza, Hepatitis B, Td, Zostavax, HCV  Electrocardiogram  Cardiovascular Disease  Colorectal cancer screening  Bone density screening  Diabetes screening  Glaucoma screening  Mammography/PAP  Nutrition counseling   Patient Instructions (the written plan) was given to the patient.   Byrd Rushlow NICOLE Travia Onstad  05/21/2015     I have reviewed this visit and discussed with Lamont Dowdy and agree with her documentation CHAMBLISS,MARSHALL L

## 2015-05-22 ENCOUNTER — Ambulatory Visit
Admission: RE | Admit: 2015-05-22 | Discharge: 2015-05-22 | Disposition: A | Discharge status: Home / Self Care | Payer: Medicare Other | Source: Ambulatory Visit | Attending: Radiation Oncology | Admitting: Radiation Oncology

## 2015-05-22 DIAGNOSIS — C50511 Malignant neoplasm of lower-outer quadrant of right female breast: Secondary | ICD-10-CM | POA: Diagnosis not present

## 2015-05-23 ENCOUNTER — Ambulatory Visit
Admission: RE | Admit: 2015-05-23 | Discharge: 2015-05-23 | Disposition: A | Discharge status: Home / Self Care | Payer: Medicare Other | Source: Ambulatory Visit | Attending: Radiation Oncology | Admitting: Radiation Oncology

## 2015-05-23 DIAGNOSIS — C50511 Malignant neoplasm of lower-outer quadrant of right female breast: Secondary | ICD-10-CM | POA: Diagnosis not present

## 2015-05-26 ENCOUNTER — Ambulatory Visit
Admission: RE | Admit: 2015-05-26 | Discharge: 2015-05-26 | Disposition: A | Discharge status: Home / Self Care | Payer: Medicare Other | Source: Ambulatory Visit | Attending: Radiation Oncology | Admitting: Radiation Oncology

## 2015-05-26 DIAGNOSIS — C50511 Malignant neoplasm of lower-outer quadrant of right female breast: Secondary | ICD-10-CM | POA: Diagnosis not present

## 2015-05-27 ENCOUNTER — Ambulatory Visit
Admission: RE | Admit: 2015-05-27 | Discharge: 2015-05-27 | Disposition: A | Discharge status: Home / Self Care | Payer: Medicare Other | Source: Ambulatory Visit | Attending: Radiation Oncology | Admitting: Radiation Oncology

## 2015-05-27 VITALS — BP 127/72 | HR 64 | Temp 97.3°F | Wt 229.7 lb

## 2015-05-27 DIAGNOSIS — C50511 Malignant neoplasm of lower-outer quadrant of right female breast: Secondary | ICD-10-CM | POA: Diagnosis not present

## 2015-05-27 MED ORDER — CEPHALEXIN 500 MG PO CAPS: 500.0000 mg | ORAL_CAPSULE | Freq: Four times a day (QID) | ORAL | Status: DC

## 2015-05-27 NOTE — Progress Notes (Signed)
  Radiation Oncology         [phone]   Name: [name] MRN: [id number]   Date: 05/27/2015  DOB: 03-13-1951   Weekly Radiation Therapy Management    ICD-9-CM ICD-10-CM   1. Breast cancer of lower-outer quadrant of right female breast 174.5 C50.511 cephALEXin (KEFLEX) 500 MG capsule    Current Dose: 27 Gy  Planned Dose:  61 Gy  Narrative The patient presents for routine under treatment assessment.  Mild hyperpigmentation. Patient had a pneumonia shot on 05/21/15 with family practice, [name], and the opposite arm is swollen and red with mild pain now. Discussed cellulitis and to continue monitoring the injection site for possible infection.  The patient is without complaint. Set-up films were reviewed. The chart was checked.  Physical Findings  weight is 229 lb 11.2 oz (104.191 kg). Her temperature is 97.3 F (36.3 C). Her blood pressure is 127/72 and her pulse is 64. . Weight essentially stable.  No significant changes. Potential cellulitis where pneumonia shot was given.  Impression The patient is tolerating radiation.  Plan Continue treatment as planned. Prescribed keflex 500mg  qid for 7 days for cellulitis. Instructed to call if she develops a fever.     This document serves as a record of services personally performed by Tyler Pita, MD. It was created on his behalf by Arlyce Harman, a trained medical scribe. The creation of this record is based on the scribe's personal observations and the provider's statements to them. This document has been checked and approved by the attending provider.       Sheral Apley Tammi Klippel, M.D.

## 2015-05-27 NOTE — Progress Notes (Signed)
Weekly assessment of radiation to right breast.Completed 15 of 33 treatments.Mild hyperpigmentation.Patient had a pneumonia shot on 05/21/15 and the opposite arm is swollen and red with mild pain now.

## 2015-05-28 ENCOUNTER — Ambulatory Visit
Admission: RE | Admit: 2015-05-28 | Discharge: 2015-05-28 | Disposition: A | Discharge status: Home / Self Care | Payer: Medicare Other | Source: Ambulatory Visit | Attending: Radiation Oncology | Admitting: Radiation Oncology

## 2015-05-28 DIAGNOSIS — C50511 Malignant neoplasm of lower-outer quadrant of right female breast: Secondary | ICD-10-CM | POA: Diagnosis not present

## 2015-05-29 ENCOUNTER — Other Ambulatory Visit: Payer: Self-pay | Admitting: Physician Assistant

## 2015-05-29 ENCOUNTER — Ambulatory Visit
Admission: RE | Admit: 2015-05-29 | Discharge: 2015-05-29 | Disposition: A | Discharge status: Home / Self Care | Payer: Medicare Other | Source: Ambulatory Visit | Attending: Radiation Oncology | Admitting: Radiation Oncology

## 2015-05-29 DIAGNOSIS — C50511 Malignant neoplasm of lower-outer quadrant of right female breast: Secondary | ICD-10-CM

## 2015-05-30 ENCOUNTER — Ambulatory Visit (HOSPITAL_BASED_OUTPATIENT_CLINIC_OR_DEPARTMENT_OTHER): Payer: Medicare Other | Admitting: Physician Assistant

## 2015-05-30 ENCOUNTER — Other Ambulatory Visit (HOSPITAL_BASED_OUTPATIENT_CLINIC_OR_DEPARTMENT_OTHER): Payer: Medicare Other

## 2015-05-30 ENCOUNTER — Telehealth: Payer: Self-pay | Admitting: Hematology

## 2015-05-30 ENCOUNTER — Ambulatory Visit
Admission: RE | Admit: 2015-05-30 | Discharge: 2015-05-30 | Disposition: A | Discharge status: Home / Self Care | Payer: Medicare Other | Source: Ambulatory Visit | Attending: Radiation Oncology | Admitting: Radiation Oncology

## 2015-05-30 VITALS — BP 143/62 | HR 18 | Temp 97.4°F | Resp 18 | Ht 62.0 in | Wt 230.4 lb

## 2015-05-30 DIAGNOSIS — Z170 Estrogen receptor positive status [ER+]: Secondary | ICD-10-CM | POA: Diagnosis not present

## 2015-05-30 DIAGNOSIS — C50811 Malignant neoplasm of overlapping sites of right female breast: Secondary | ICD-10-CM

## 2015-05-30 DIAGNOSIS — I10 Essential (primary) hypertension: Secondary | ICD-10-CM

## 2015-05-30 DIAGNOSIS — E119 Type 2 diabetes mellitus without complications: Secondary | ICD-10-CM | POA: Diagnosis not present

## 2015-05-30 DIAGNOSIS — C50511 Malignant neoplasm of lower-outer quadrant of right female breast: Secondary | ICD-10-CM

## 2015-05-30 LAB — CBC WITH DIFFERENTIAL/PLATELET
BASO%: 0.5 % (ref 0.0–2.0)
Basophils Absolute: 0.1 10*3/uL (ref 0.0–0.1)
EOS ABS: 0.2 10*3/uL (ref 0.0–0.5)
EOS%: 1.8 % (ref 0.0–7.0)
HEMATOCRIT: 33.5 % — AB (ref 34.8–46.6)
HEMOGLOBIN: 10.6 g/dL — AB (ref 11.6–15.9)
LYMPH%: 26.4 % (ref 14.0–49.7)
MCH: 29.9 pg (ref 25.1–34.0)
MCHC: 31.6 g/dL (ref 31.5–36.0)
MCV: 94.6 fL (ref 79.5–101.0)
MONO#: 1.1 10*3/uL — ABNORMAL HIGH (ref 0.1–0.9)
MONO%: 9.5 % (ref 0.0–14.0)
NEUT%: 61.8 % (ref 38.4–76.8)
NEUTROS ABS: 7.3 10*3/uL — AB (ref 1.5–6.5)
Platelets: 205 10*3/uL (ref 145–400)
RBC: 3.54 10*6/uL — ABNORMAL LOW (ref 3.70–5.45)
RDW: 13.4 % (ref 11.2–14.5)
WBC: 11.8 10*3/uL — ABNORMAL HIGH (ref 3.9–10.3)
lymph#: 3.1 10*3/uL (ref 0.9–3.3)

## 2015-05-30 LAB — COMPREHENSIVE METABOLIC PANEL (CC13)
ALT: 17 U/L (ref 0–55)
AST: 17 U/L (ref 5–34)
Albumin: 3.5 g/dL (ref 3.5–5.0)
Alkaline Phosphatase: 128 U/L (ref 40–150)
Anion Gap: 13 mEq/L — ABNORMAL HIGH (ref 3–11)
BUN: 27.4 mg/dL — ABNORMAL HIGH (ref 7.0–26.0)
CALCIUM: 9.6 mg/dL (ref 8.4–10.4)
CO2: 26 mEq/L (ref 22–29)
Chloride: 103 mEq/L (ref 98–109)
Creatinine: 1.3 mg/dL — ABNORMAL HIGH (ref 0.6–1.1)
EGFR: 48 mL/min/{1.73_m2} — ABNORMAL LOW (ref >90)
Glucose: 152 mg/dl — ABNORMAL HIGH (ref 70–140)
Potassium: 4.6 mEq/L (ref 3.5–5.1)
Sodium: 141 mEq/L (ref 136–145)
TOTAL PROTEIN: 7.5 g/dL (ref 6.4–8.3)
Total Bilirubin: 0.41 mg/dL (ref 0.20–1.20)

## 2015-05-30 LAB — TECHNOLOGIST REVIEW

## 2015-05-30 MED ORDER — EXEMESTANE 25 MG PO TABS: 25.0000 mg | ORAL_TABLET | Freq: Every day | ORAL | Status: DC

## 2015-05-30 NOTE — Telephone Encounter (Signed)
Gave and printed appt sched and avs for pt for July  °

## 2015-06-03 ENCOUNTER — Ambulatory Visit
Admission: RE | Admit: 2015-06-03 | Discharge: 2015-06-03 | Disposition: A | Discharge status: Home / Self Care | Payer: Medicare Other | Source: Ambulatory Visit | Attending: Radiation Oncology | Admitting: Radiation Oncology

## 2015-06-03 VITALS — BP 134/78 | HR 61 | Resp 16 | Wt 231.8 lb

## 2015-06-03 DIAGNOSIS — C50511 Malignant neoplasm of lower-outer quadrant of right female breast: Secondary | ICD-10-CM | POA: Diagnosis not present

## 2015-06-03 NOTE — Progress Notes (Signed)
Weight and vitals stable. Denies pain. Reports hyperpigmentation of right breast without desquamation. Reports using radiaplex on affected skin. Denies fatigue.   BP 134/78 mmHg  Pulse 61  Resp 16  Wt 231 lb 12.8 oz (105.144 kg) Wt Readings from Last 3 Encounters:  06/03/15 231 lb 12.8 oz (105.144 kg)  05/30/15 230 lb 6.4 oz (104.509 kg)  05/27/15 229 lb 11.2 oz (104.191 kg)

## 2015-06-03 NOTE — Progress Notes (Addendum)
Weekly Management Note Current Dose: 34.2  Gy  Projected Dose: 61 Gy   Narrative:  The patient presents for routine under treatment assessment.  CBCT/MVCT images/Port film x-rays were reviewed.  The chart was checked. Doing well. No complaints.   Physical Findings: Weight: 231 lb 12.8 oz (105.144 kg). Hyperpigmentation of the right breast. Inframammary fold intact.   Impression:  The patient is tolerating radiation.  Plan:  Continue treatment as planned. Continue RT and radiaplex.

## 2015-06-04 ENCOUNTER — Ambulatory Visit
Admission: RE | Admit: 2015-06-04 | Discharge: 2015-06-04 | Disposition: A | Discharge status: Home / Self Care | Payer: Medicare Other | Source: Ambulatory Visit | Attending: Radiation Oncology | Admitting: Radiation Oncology

## 2015-06-04 DIAGNOSIS — C50511 Malignant neoplasm of lower-outer quadrant of right female breast: Secondary | ICD-10-CM | POA: Diagnosis not present

## 2015-06-04 NOTE — Patient Instructions (Signed)
Take Aromasin 25 mg by mouth daily Follow up in one month

## 2015-06-04 NOTE — Progress Notes (Signed)
[place]  [phone] Fax:([phone] [place] Note   Patient Care Team: Lind Covert, MD as PCP - General (Family Medicine) Harriet Masson, DPM (Podiatry) Gevena Cotton, MD (Ophthalmology) Rana Snare, MD (Urology) Dr. Edwin Cap ([place]) Fanny Skates, MD as Consulting Physician (General Surgery) Thea Silversmith, MD as Consulting Physician (Radiation Oncology) Mauro Kaufmann, RN as Registered Nurse Rockwell Germany, RN as Registered Nurse Holley Bouche, NP as Nurse Practitioner (Nurse Practitioner) Truitt Merle, MD as Consulting Physician (Hematology) 06/04/2015  CHIEF COMPLAINTS/PURPOSE OF CONSULTATION:  Follow up breast cancer  Oncology History   Breast cancer of lower-outer quadrant of right female breast   Staging form: Breast, AJCC 7th Edition     Clinical stage from 02/19/2015: Stage IA (T1b, N0, M0) - Unsigned       Staging comments: Staged at breast conference on 3.23.16      Pathologic stage from 03/24/2015: Stage IA (T1c, N0, cM0) - Signed by Enid Cutter, MD on 03/31/2015       Staging comments: Staged on final lumpectomy specimen by Dr. Donato Heinz.        Breast cancer of lower-outer quadrant of right female breast   02/03/2015 Breast US ultrasound is performed, showing a 5 x 5 x 5 mm irregular hypoechoic mass with internal color vascularity within the right breast 6 o'clock position 4 cm from the nipple   02/10/2015 Pathology Results INVASIVE DUCTAL CARCINOMA. - DUCTAL CARCINOMA IN SITU.   02/10/2015 Receptors her2 Estrogen Receptor: 100%, POSITIVE, STRONG STAINING INTENSITY Progesterone Receptor: 60%, POSITIVE, STRONG STAINING INTENSITY Proliferation Marker Ki67: 3%  HER-2/NEU BY CISH - NEGATIVE   02/12/2015 Breast MRI Biopsy-proven malignancy in the central right breast at posterior depth. 2. No MR findings to suggest multicentric or contralateral malignancy.   02/13/2015 Initial Diagnosis Breast cancer of  lower-outer quadrant of right female breast   03/21/2015 Surgery Right breast lumpectomy and sentinel lymph node biopsy, surgical margins were negative.   03/21/2015 Pathologic Stage Invasive ductal carcinoma, DCIS, negative for lymphovascular invasion. One lymph node were negative. Grade 1, tumor size 1.1 cm, ER 100% positive, PR 73 positive, HER-2 negative, Ki67 6%.   03/21/2015 Oncotype testing RS 7, low risk. It predicts 5% 10-year recurrence with Tamoxifen alone.      HISTORY OF PRESENTING ILLNESS:  [name] 64 y.o. female is here because of newly diagnosed breast cancer.  This was discovered by screening mammogtram. Her last screening mammogram in February 2015 was normal.  The screening mammogram on 01/23/2015 showed a possible mass in the right breast. She underwent diagnostic mammogram and ultrasound on 02/03/2015, which showed a 5 x 5 x 5 mm irregular mass in the right breast 6:00 position.  The biopsy of the mass showed invasive ductal carcinoma and DCIS.   She has multiple medical problems. She has chronic arthritis, with diffuse join pain, 8/10, she takes tylenol. No chest pain, (+) dyspena on moderate exertion, no nausea, abdominal bloating or change of her bowel habits. She is able to do most of her ADLs, limited light housework, not physically active, spends quite a bit of time sitting during the daytime.  She was hospitalized for UTI in 9/15 and went to rehab for a month. She has been using a cane more regularly after that.   INTERIM HISTORY: She returns for follow-up. She underwent right breast lumpectomy and sentinel lymph nodes biopsy on 03/21/2015. She tolerated the surgery very well. No significant residual  pain. She is back to her normal activity level, she uses a cane to walk around and remains to be moderately active. She has a good appetite and eats well. She presents to discuss the results of her Oncotype test and to discuss treatment options.  MEDICAL HISTORY:  Past  Medical History  Diagnosis Date  . Hypertension   . Diabetes mellitus   . Depression   . Asthma   . Eczema   . Arthritis   . Cataract   . GERD (gastroesophageal reflux disease)   . History of kidney stones     w/ hx of hydronephrosis - followed by [facility]  . Anemia   . Obesity   . HIV nonspecific serology     2006: indeterminate HIV blood test, seen by ID, felt secondary to cross reacting antibodies with no further workup felt necessary at that time   . Fatty liver     Fatty infiltration of liver noted on 03/2012 CT scan  . CHF, acute 05/03/2012  . Breast cancer of lower-outer quadrant of right female breast 02/13/2015   GYN HISTORY  Menarchal: 11 LMP: Several years ago Contraceptive: 1979-1986 HRT: NO G3P2:  SURGICAL HISTORY: Past Surgical History  Procedure Laterality Date  . Cystoscopy w/ litholapaxy / ehl    . Cholecystectomy  2003  . Replacement total knee bilateral  2005 &2006  . Joint replacement      bilateral knee replacement  . Vascular surgery Right 03/15/2013    Ultrasound guided sclerotherapy  . Left and right heart catheterization with coronary angiogram N/A 05/02/2012    Procedure: LEFT AND RIGHT HEART CATHETERIZATION WITH CORONARY ANGIOGRAM;  Surgeon: Burnell Blanks, MD;  Location: [facility] CATH LAB;  Service: Cardiovascular;  Laterality: N/A;  . Radioactive seed guided mastectomy with axillary sentinel lymph node biopsy Right 03/21/2015    Procedure: RIGHT  PARTIAL MASTECTOMY WITH RADIOACTIVE SEED LOCALIZATION RIGHT  AXILLARY SENTINEL LYMPH NODE BIOPSY;  Surgeon: Fanny Skates, MD;  Location: [place];  Service: General;  Laterality: Right;    SOCIAL HISTORY: History   Social History  . Marital Status: Widowed     Spouse Name: [name]  . Number of Children: 3  . Years of Education: 14   Occupational History  . retired-CNA, housekeeping    Social History Main Topics  . Smoking status: Never Smoker   . Smokeless tobacco:  Never Used  . Alcohol Use: No  . Drug Use: No  . Sexual Activity: No   Other Topics Concern  . Not on file   Social History Narrative   Health Care POA:    Emergency Contact: son, Ellowyn Rieves [phone]   End of Life Plan:    Who lives with you:  husband Darrall Dears- Monique is his primary care giver   Any pets: [place]   Diet: Pt has a varied diet.  Is currently working on smaller portions sizes and no late night snacking for weight loss.   Exercise: Pt exercises 1x weekly with church group.   Seatbelts: Pt reports wearing seatbelt when in vehicles.    Hobbies: word searches, church, time with family and friends             FAMILY HISTORY: Family History  Problem Relation Age of Onset  . Diabetes Mother   . Stroke Mother   . Heart disease Father   . Anemia Father   . Cancer Sister 17    nose cancer   .  Cancer Cousin 30    ovarian cancer   . Cancer Cousin 26    ovarian cancer     ALLERGIES:  has No Known Allergies.  MEDICATIONS:  Current Outpatient Prescriptions  Medication Sig Dispense Refill  . acetaminophen (TYLENOL) 500 MG tablet Take 1,000 mg by mouth 2 (two) times daily.    Marland Kitchen albuterol (PROVENTIL HFA;VENTOLIN HFA) 108 (90 BASE) MCG/ACT inhaler Inhale 2 puffs into the lungs every 6 (six) hours as needed for wheezing or shortness of breath.    Marland Kitchen aspirin 81 MG tablet Take 81 mg by mouth daily.    . benazepril (LOTENSIN) 20 MG tablet Take 1 tablet (20 mg total) by mouth daily. 90 tablet 3  . cephALEXin (KEFLEX) 500 MG capsule Take 1 capsule (500 mg total) by mouth 4 (four) times daily. 28 capsule 0  . colchicine 0.6 MG tablet take 1 tablet by mouth twice a day 60 tablet 3  . FLOVENT HFA 110 MCG/ACT inhaler inhale 2 puffs by mouth twice a day 12 g 6  . furosemide (LASIX) 40 MG tablet take 1 tablet by mouth once daily 90 tablet 1  . gabapentin (NEURONTIN) 100 MG capsule Take 2 capsules (200 mg total) by mouth 3 (three) times daily. 270 capsule 3  .  HYDROcodone-acetaminophen (NORCO) 5-325 MG per tablet Take 1-2 tablets by mouth every 6 (six) hours as needed for moderate pain or severe pain. 30 tablet 0  . metoprolol tartrate (LOPRESSOR) 25 MG tablet Take 1 tablet (25 mg total) by mouth 2 (two) times daily. 60 tablet 11  . potassium citrate (UROCIT-K) 10 MEQ (1080 MG) SR tablet take 1 tablet by mouth twice a day 60 tablet 6  . ranitidine (ZANTAC) 150 MG tablet   1  . spironolactone (ALDACTONE) 25 MG tablet TAKE 1 TABLET BY MOUTH ONCE A DAY 30 tablet 6  . trolamine salicylate (ASPERCREME) 10 % cream Apply 1 application topically as needed for muscle pain.     Marland Kitchen exemestane (AROMASIN) 25 MG tablet Take 1 tablet (25 mg total) by mouth daily after breakfast. 30 tablet 1   No current facility-administered medications for this visit.    REVIEW OF SYSTEMS:   Constitutional: Denies fevers, chills or abnormal night sweats Eyes: Denies blurriness of vision, double vision or watery eyes Ears, nose, mouth, throat, and face: Denies mucositis or sore throat Respiratory: Denies cough, dyspnea or wheezes Cardiovascular: Denies palpitation, chest discomfort or lower extremity swelling Gastrointestinal:  Denies nausea, heartburn or change in bowel habits Skin: Denies abnormal skin rashes Lymphatics: Denies new lymphadenopathy or easy bruising Neurological:Denies numbness, tingling or new weaknesses Behavioral/Psych: Mood is stable, no new changes  All other systems were reviewed with the patient and are negative.  PHYSICAL EXAMINATION: ECOG PERFORMANCE STATUS: 2 - Symptomatic, <50% confined to bed  Filed Vitals:   05/30/15 1023  BP: 143/62  Pulse: 18  Temp: 97.4 F (36.3 C)  Resp: 18   Filed Weights   05/30/15 1023  Weight: 230 lb 6.4 oz (104.509 kg)    GENERAL:alert, no distress and comfortable SKIN: skin color, texture, turgor are normal, no rashes or significant lesions EYES: normal, conjunctiva are pink and non-injected, sclera  clear OROPHARYNX:no exudate, no erythema and lips, buccal mucosa, and tongue normal  NECK: supple, thyroid normal size, non-tender, without nodularity LYMPH:  no palpable lymphadenopathy in the cervical, axillary or inguinal LUNGS: clear to auscultation and percussion with normal breathing effort HEART: regular rate & rhythm and no murmurs and  no lower extremity edema ABDOMEN:abdomen soft, non-tender and normal bowel sounds Musculoskeletal:no cyanosis of digits and no clubbing  PSYCH: alert & oriented x 3 with fluent speech NEURO: no focal motor/sensory deficits Breasts: exam deferred   LABORATORY DATA:  I have reviewed the data as listed Lab Results  Component Value Date   WBC 11.8* 05/30/2015   HGB 10.6* 05/30/2015   HCT 33.5* 05/30/2015   MCV 94.6 05/30/2015   PLT 205 05/30/2015    Recent Labs  07/23/14 0806 07/24/14 0700 07/25/14 0655  09/18/14 1018 10/02/14 1019 01/22/15 1128 02/19/15 0831 05/30/15 0950  NA 135* 139 139  < > 142 139 141 142 141  K 4.6 4.2 4.7  < > 4.7 4.5 4.7 4.6 4.6  CL 96 100 103  < > 106 102 104  --   --   CO2 _0 < > _1 GLUCOSE 133* 174* 118*  < > 120* 99 114* 131 152*  BUN 29* 28* 19  < > 39* 16 32* 33.6* 27.4*  CREATININE 1.31* 1.20* 1.17*  < > 1.59* 1.19* 1.38* 1.3* 1.3*  CALCIUM 9.0 9.0 8.8  < > 9.3 9.5 9.5 9.2 9.6  GFRNONAA 42* 46* 48*  --   --   --   --   --   --   GFRAA 48* 54* 55*  --   --   --   --   --   --   PROT 7.4 7.1 7.1  < >  --   --  7.6 7.2 7.5  ALBUMIN 2.0* 2.0* 2.0*  < >  --   --  4.2 3.7 3.5  AST _2 < >  --   --  _3 ALT _4 < >  --   --  _5 ALKPHOS 157* 164* 139*  < >  --   --  119* 139 128  BILITOT 1.3* 0.7 0.4  < >  --   --  0.6 0.42 0.41  < > = values in this interval not displayed.  Pathology 03/21/2015 Diagnosis 1. Breast, lumpectomy, right - INVASIVE DUCTAL CARCINOMA, SEE COMMENT. - DUCTAL CARCINOMA IN SITU. - NEGATIVE FOR LYMPH VASCULAR INVASION. - PREVIOUS  BIOPSY SITE IDENTIFIED. - SEE TUMOR SYNOPTIC TEMPLATE BELOW. 2. Lymph node, sentinel, biopsy, right axillary - ONE LYMPH NODE, NEGATIVE FOR TUMOR (0/1).  1. BREAST, INVASIVE TUMOR, WITH LYMPH NODES PRESENT Specimen, including laterality and lymph node sampling (sentinel, non-sentinel): Right breast with sentinel lymph node sampling. Procedure: Lumpectomy. Histologic type: Grade: I of III Tubule formation: 1 Nuclear pleomorphism: 2 Mitotic:1 Tumor size (gross measurement): 1.1 cm Margins: Invasive, distance to closest margin: 0.9 cm (medial). In-situ, distance to closest margin: Same as invasive. If margin positive, focally or broadly: N/A Lymphovascular invasion: Absent. Ductal carcinoma in situ: Present. Grade: I of III Extensive intraductal component: Absent. Lobular neoplasia: Absent. Tumor focality: Unifocal. Treatment effect: None. If present, treatment effect in breast tissue, lymph nodes or both: N/A Extent of tumor: Skin: N/A Nipple: N/A Skeletal muscle: N/A 2 of 4 FINAL for KHALANI, NOVOA ([id number]) Microscopic Comment(continued) Lymph nodes: Examined: 1 Sentinel 0 Non-sentinel 1 Total Lymph nodes with metastasis: 0 Isolated tumor cells (< 0.2 mm): N/A Micrometastasis: (> 0.2 mm and < 2.0 mm): N/A Macrometastasis: (> 2.0 mm): N/A Extracapsular extension: N/A Breast prognostic profile: Estrogen receptor: Not repeated, previous study demonstrated 100% positivity ([id number]). Progesterone  receptor: Not repeated, previous study demonstrated 60% positivity ([id number]). Her 2 neu: Repeated, previous study demonstrated no amplification (1.70) ([id number]). Ki-67: Not repeated, previous study demonstrated 3% proliferation rate ([id number]). Non-neoplastic breast: Previous biopsy site related tissue changes. TNM: pT1c, pN0, pMX  Results: IMMUNOHISTOCHEMICAL AND MORPHOMETRIC ANALYSIS BY THE AUTOMATED CELLULAR IMAGING SYSTEM (ACIS) Estrogen Receptor: 100%,  POSITIVE, STRONG STAINING INTENSITY Progesterone Receptor: 73%, POSITIVE, STRONG STAINING INTENSITY Proliferation Marker Ki67: 6%  1. CHROMOGENIC IN-SITU HYBRIDIZATION Results: HER-2/NEU BY CISH - NEGATIVE. RESULT RATIO OF HER2: CEP 17 SIGNALS 1.30 AVERAGE HER2 COPY NUMBER PER CELL 1.95   RADIOGRAPHIC STUDIES: I have personally reviewed the radiological images as listed and agreed with the findings in the report.  Mr Breast Bilateral W Wo Contrast 02/13/2015    FINDINGS: Breast composition: Scattered fibroglandular densities.  Background parenchymal enhancement: The multiplanar, multi sequence bilateral breast MR demonstrates moderate background parenchymal enhancement with scattered foci of non mass enhancement bilaterally with benign MR features.  Right breast: A focus of metallic clip artifact is identified in the central right breast at posterior depth denoting site of biopsy-proven disease with mild associated post biopsy sequela. At the site of biopsy, examination demonstrates a small round homogeneously enhancing mass with irregular margins measuring approximately 6 x 6 x 6 mm consistent with a residual disease. No additional suspicious mass or enhancement to suggest multifocal or multicentric disease.  Left breast: Scattered foci of non mass enhancement with benign MR features. No abnormal mass, suspicious enhancement, or morphologic abnormality to suggest contralateral disease.  Lymph nodes: The axillary and internal mammary lymph node chains are symmetric and unremarkable.  Ancillary findings:  None.   IMPRESSION: 1. Biopsy-proven malignancy in the central right breast at posterior depth. 2. No MR findings to suggest multicentric or contralateral malignancy. 3. No evidence of adenopathy.  RECOMMENDATION: Continued surgical management.  BI-RADS CATEGORY  6: Known biopsy-proven malignancy.   Electronically Signed   By: Andres Shad   On: 02/13/2015 10:44     02/03/2015 US breast  Targeted  ultrasound is performed, showing a 5 x 5 x 5 mm irregular hypoechoic mass with internal color vascularity within the right breast 6 o'clock position 4 cm from the nipple. No right axillary lymphadenopathy.  IMPRESSION: Suspicious right breast mass.  01/23/2015  Mammogram  FINDINGS: In the right breast, a possible mass warrants further evaluation with spot compression views and possibly ultrasound. In the left breast, no findings suspicious for malignancy. Images were processed with CAD.  IMPRESSION: Further evaluation is suggested for possible mass in the right breast.  RECOMMENDATION: Diagnostic mammogram and possibly ultrasound of the right breast.  OncotypeDx; Recurrence Score Result was 7, considered low risk for disease recurrence in 10 years.  ASSESSMENT & PLAN:  64 year old postmenopausal African-American American female, screening detected right breast invasive ductal carcinoma and DCIS.  1. pT1c N0 M0, stage IA right breast invasive ductal carcinoma, ER positive PR positive HER-2 negative, Ki 67 6%, and DCIS -[name] previously discussed her imaging findings and surgical pathology results with her and her daughters in great details. -She has early-stage breast cancer, likely cured by the complete surgical resection. -She would benefit from adjuvant endocrine therapy, with a aromatase inhibitor or tamoxifen. Given her significant arthritis, she may not tolerate aromatase inhibitor well and tamoxifen would be a reasonable option. Patient reviewed with [name]. We will give the patient a trial of Aromasin 25 mg by mouth daily, #30 with 1 refill. I discussed the results of her OnotypeDx.  She voiced understanding. -She will continue breast radiation therapy under the care of [name].  2. Hypertension, diabetes, arthritis, CHF -She will continue follow-up with her primary care physician  Follow up: -She will follow up in one month to assess her tolerability of  Aromasin.  All questions were answered. The patient knows to call the clinic with any problems, questions or concerns. I spent 20 minutes counseling the patient face to face. The total time spent in the appointment was 25 minutes and more than 50% was on counseling.     Carlton Adam, PA-C 06/04/2015 6:54 PM

## 2015-06-05 ENCOUNTER — Ambulatory Visit
Admission: RE | Admit: 2015-06-05 | Discharge: 2015-06-05 | Disposition: A | Discharge status: Home / Self Care | Payer: Medicare Other | Source: Ambulatory Visit | Attending: Radiation Oncology | Admitting: Radiation Oncology

## 2015-06-05 DIAGNOSIS — C50511 Malignant neoplasm of lower-outer quadrant of right female breast: Secondary | ICD-10-CM | POA: Diagnosis not present

## 2015-06-06 ENCOUNTER — Ambulatory Visit
Admission: RE | Admit: 2015-06-06 | Discharge: 2015-06-06 | Disposition: A | Discharge status: Home / Self Care | Payer: Medicare Other | Source: Ambulatory Visit | Attending: Radiation Oncology | Admitting: Radiation Oncology

## 2015-06-06 DIAGNOSIS — C50511 Malignant neoplasm of lower-outer quadrant of right female breast: Secondary | ICD-10-CM | POA: Diagnosis not present

## 2015-06-09 ENCOUNTER — Ambulatory Visit
Admission: RE | Admit: 2015-06-09 | Discharge: 2015-06-09 | Disposition: A | Discharge status: Home / Self Care | Payer: Medicare Other | Source: Ambulatory Visit | Attending: Radiation Oncology | Admitting: Radiation Oncology

## 2015-06-09 DIAGNOSIS — C50511 Malignant neoplasm of lower-outer quadrant of right female breast: Secondary | ICD-10-CM | POA: Diagnosis not present

## 2015-06-10 ENCOUNTER — Ambulatory Visit: Payer: Medicare Other | Admitting: Radiation Oncology

## 2015-06-10 ENCOUNTER — Ambulatory Visit
Admission: RE | Admit: 2015-06-10 | Discharge: 2015-06-10 | Disposition: A | Discharge status: Home / Self Care | Payer: Medicare Other | Source: Ambulatory Visit | Attending: Radiation Oncology | Admitting: Radiation Oncology

## 2015-06-10 ENCOUNTER — Encounter: Payer: Self-pay | Admitting: Radiation Oncology

## 2015-06-10 VITALS — BP 136/98 | HR 66 | Temp 98.0°F | Wt 229.8 lb

## 2015-06-10 DIAGNOSIS — Z510 Encounter for antineoplastic radiation therapy: Secondary | ICD-10-CM | POA: Insufficient documentation

## 2015-06-10 DIAGNOSIS — C50511 Malignant neoplasm of lower-outer quadrant of right female breast: Secondary | ICD-10-CM | POA: Diagnosis not present

## 2015-06-10 MED ORDER — RADIAPLEXRX EX GEL
Freq: Once | CUTANEOUS | Status: AC
  Administered 2015-06-10: 10:00:00 via TOPICAL

## 2015-06-10 NOTE — Progress Notes (Signed)
Weekly Management Note Current Dose:  43.2 Gy  Projected Dose: 61 Gy   Narrative:  The patient presents for routine under treatment assessment.  CBCT/MVCT images/Port film x-rays were reviewed.  The chart was checked. Doing well. No complaints except sore skin.   Physical Findings: Weight: 229 lb 12.8 oz (104.237 kg). Dark irritated skin over breast and under arm.   Impression:  The patient is tolerating radiation.  Plan:  Continue treatment as planned. Continue radiaplex.

## 2015-06-10 NOTE — Progress Notes (Signed)
Completed 24 of 33 treatments.Skin is very dark without peeling.Has increased tenderness greater of right axilla.Takes tylenol for discomfort.Will give another tube of radiaplex.

## 2015-06-11 ENCOUNTER — Ambulatory Visit
Admission: RE | Admit: 2015-06-11 | Discharge: 2015-06-11 | Disposition: A | Discharge status: Home / Self Care | Payer: Medicare Other | Source: Ambulatory Visit | Attending: Radiation Oncology | Admitting: Radiation Oncology

## 2015-06-11 ENCOUNTER — Ambulatory Visit (INDEPENDENT_AMBULATORY_CARE_PROVIDER_SITE_OTHER): Payer: Medicare Other | Admitting: Family Medicine

## 2015-06-11 ENCOUNTER — Encounter: Payer: Self-pay | Admitting: Family Medicine

## 2015-06-11 VITALS — BP 107/75 | HR 61 | Temp 97.9°F | Ht 62.0 in | Wt 226.0 lb

## 2015-06-11 DIAGNOSIS — E1169 Type 2 diabetes mellitus with other specified complication: Secondary | ICD-10-CM

## 2015-06-11 DIAGNOSIS — C50511 Malignant neoplasm of lower-outer quadrant of right female breast: Secondary | ICD-10-CM | POA: Diagnosis not present

## 2015-06-11 DIAGNOSIS — I10 Essential (primary) hypertension: Secondary | ICD-10-CM

## 2015-06-11 DIAGNOSIS — M21969 Unspecified acquired deformity of unspecified lower leg: Secondary | ICD-10-CM | POA: Diagnosis not present

## 2015-06-11 DIAGNOSIS — I5022 Chronic systolic (congestive) heart failure: Secondary | ICD-10-CM | POA: Diagnosis not present

## 2015-06-11 LAB — POCT GLYCOSYLATED HEMOGLOBIN (HGB A1C): Hemoglobin A1C: 7

## 2015-06-11 MED ORDER — METOPROLOL SUCCINATE ER 25 MG PO TB24: 25.0000 mg | ORAL_TABLET | Freq: Every day | ORAL | Status: DC

## 2015-06-11 NOTE — Assessment & Plan Note (Signed)
Doing well See if can wean lasix

## 2015-06-11 NOTE — Assessment & Plan Note (Signed)
Diet controlled.  A1c up slightly Work on diet.  May need to restart metformin

## 2015-06-11 NOTE — Assessment & Plan Note (Signed)
Stable continue medications

## 2015-06-11 NOTE — Patient Instructions (Signed)
Good to see you today!  Thanks for coming in.  We changed your metoprolol from tartrate twice daily to succinate once daily  TAke 1/2 of Furosemide (water pill) once a day  Once the cancer treatment is done we will check a fasting cholesterol next visit when you come in 3 months

## 2015-06-11 NOTE — Progress Notes (Signed)
   Subjective:    Patient ID: [name], female    DOB: July 22, 1951, 64 y.o.   MRN: [id number]  HPI  Breast Cancer - tolerating treatments well  HYPERTENSION Disease Monitoring: Blood pressure range-not checking  Chest pain, palpitations- no      Dyspnea- no Medications: Compliance- daily  Lightheadedness,Syncope- no   Edema- mild takes Lasix daily   DIABETES Disease Monitoring: Blood Sugar ranges-not checking Polyuria/phagia/dipsia- no      Visual problems- no Medications: Compliance- diet controlled Hypoglycemic symptoms- no  HYPERLIPIDEMIA Disease Monitoring: See symptoms for Hypertension Medications: Compliance- not on statin Right upper quadrant pain- no  Muscle aches- no  CHF Mild shortness of breath on exertion.  Trace edema (on lasix)  Monitoring Labs and Parameters Last A1C:  Lab Results  Component Value Date   HGBA1C 7.0 06/11/2015    Last Lipid:     Component Value Date/Time   CHOL 236* 09/11/2014 0944   HDL 45 09/11/2014 0944    Last Bmet  POTASSIUM  Date Value Ref Range Status  05/30/2015 4.6 3.5 - 5.1 mEq/L Final  01/22/2015 4.7 3.5 - 5.3 mEq/L Final   SODIUM  Date Value Ref Range Status  05/30/2015 141 136 - 145 mEq/L Final  01/22/2015 141 135 - 145 mEq/L Final   CREATININE  Date Value Ref Range Status  05/30/2015 1.3* 0.6 - 1.1 mg/dL Final   CREAT  Date Value Ref Range Status  01/22/2015 1.38* 0.50 - 1.10 mg/dL Final   CREATININE, SER  Date Value Ref Range Status  07/25/2014 1.17* 0.50 - 1.10 mg/dL Final      Last BPs:  BP Readings from Last 3 Encounters:  06/11/15 107/75  06/10/15 136/98  06/03/15 134/78    Chief Complaint noted Review of Symptoms - see HPI PMH - Smoking status noted.   Vital Signs reviewed     Review of Systems     Objective:   Physical Exam  Alert nad Heart - Regular rate and rhythm.  No murmurs, gallops or rubs.    Lungs:  Normal respiratory effort, chest expands symmetrically. Lungs are  clear to auscultation, no crackles or wheezes. Extrem - trace edema wearing support hose        Assessment & Plan:

## 2015-06-12 ENCOUNTER — Ambulatory Visit
Admission: RE | Admit: 2015-06-12 | Discharge: 2015-06-12 | Disposition: A | Discharge status: Home / Self Care | Payer: Medicare Other | Source: Ambulatory Visit | Attending: Radiation Oncology | Admitting: Radiation Oncology

## 2015-06-12 DIAGNOSIS — C50511 Malignant neoplasm of lower-outer quadrant of right female breast: Secondary | ICD-10-CM | POA: Diagnosis not present

## 2015-06-13 ENCOUNTER — Ambulatory Visit
Admission: RE | Admit: 2015-06-13 | Discharge: 2015-06-13 | Disposition: A | Discharge status: Home / Self Care | Payer: Medicare Other | Source: Ambulatory Visit | Attending: Radiation Oncology | Admitting: Radiation Oncology

## 2015-06-13 DIAGNOSIS — C50511 Malignant neoplasm of lower-outer quadrant of right female breast: Secondary | ICD-10-CM | POA: Diagnosis not present

## 2015-06-16 ENCOUNTER — Ambulatory Visit
Admission: RE | Admit: 2015-06-16 | Discharge: 2015-06-16 | Disposition: A | Discharge status: Home / Self Care | Payer: Medicare Other | Source: Ambulatory Visit | Attending: Radiation Oncology | Admitting: Radiation Oncology

## 2015-06-16 ENCOUNTER — Encounter: Payer: Self-pay | Admitting: Family Medicine

## 2015-06-16 DIAGNOSIS — C50511 Malignant neoplasm of lower-outer quadrant of right female breast: Secondary | ICD-10-CM | POA: Diagnosis not present

## 2015-06-17 ENCOUNTER — Ambulatory Visit
Admission: RE | Admit: 2015-06-17 | Discharge: 2015-06-17 | Disposition: A | Discharge status: Home / Self Care | Payer: Medicare Other | Source: Ambulatory Visit | Attending: Radiation Oncology | Admitting: Radiation Oncology

## 2015-06-17 VITALS — BP 151/73 | HR 70 | Temp 97.6°F | Wt 231.9 lb

## 2015-06-17 DIAGNOSIS — C50511 Malignant neoplasm of lower-outer quadrant of right female breast: Secondary | ICD-10-CM | POA: Diagnosis not present

## 2015-06-17 NOTE — Progress Notes (Signed)
Weekly Management Note Current Dose: 53  Gy  Projected Dose: 61 Gy   Narrative:  The patient presents for routine under treatment assessment.  CBCT/MVCT images/Port film x-rays were reviewed.  The chart was checked. Doing well. Some nipple pain and soreness.   Physical Findings: Weight: 231 lb 14.4 oz (105.189 kg). Dark breast. Dry desquamation in axilla  Impression:  The patient is tolerating radiation.  Plan:  Continue treatment as planned. Follow up in 1 month. Appt with [name] on 7/29 to discuss AI. Refer to survivorship.

## 2015-06-18 ENCOUNTER — Ambulatory Visit
Admission: RE | Admit: 2015-06-18 | Discharge: 2015-06-18 | Disposition: A | Discharge status: Home / Self Care | Payer: Medicare Other | Source: Ambulatory Visit | Attending: Radiation Oncology | Admitting: Radiation Oncology

## 2015-06-18 DIAGNOSIS — C50511 Malignant neoplasm of lower-outer quadrant of right female breast: Secondary | ICD-10-CM | POA: Diagnosis not present

## 2015-06-19 ENCOUNTER — Ambulatory Visit
Admission: RE | Admit: 2015-06-19 | Discharge: 2015-06-19 | Disposition: A | Discharge status: Home / Self Care | Payer: Medicare Other | Source: Ambulatory Visit | Attending: Radiation Oncology | Admitting: Radiation Oncology

## 2015-06-19 DIAGNOSIS — C50511 Malignant neoplasm of lower-outer quadrant of right female breast: Secondary | ICD-10-CM | POA: Diagnosis not present

## 2015-06-20 ENCOUNTER — Ambulatory Visit
Admission: RE | Admit: 2015-06-20 | Discharge: 2015-06-20 | Disposition: A | Discharge status: Home / Self Care | Payer: Medicare Other | Source: Ambulatory Visit | Attending: Radiation Oncology | Admitting: Radiation Oncology

## 2015-06-20 DIAGNOSIS — C50511 Malignant neoplasm of lower-outer quadrant of right female breast: Secondary | ICD-10-CM | POA: Diagnosis not present

## 2015-06-23 ENCOUNTER — Encounter: Payer: Self-pay | Admitting: Radiation Oncology

## 2015-06-23 ENCOUNTER — Ambulatory Visit
Admission: RE | Admit: 2015-06-23 | Discharge: 2015-06-23 | Disposition: A | Discharge status: Home / Self Care | Payer: Medicare Other | Source: Ambulatory Visit | Attending: Radiation Oncology | Admitting: Radiation Oncology

## 2015-06-23 VITALS — BP 115/74 | HR 58 | Temp 97.8°F | Wt 231.7 lb

## 2015-06-23 DIAGNOSIS — C50511 Malignant neoplasm of lower-outer quadrant of right female breast: Secondary | ICD-10-CM | POA: Diagnosis not present

## 2015-06-23 MED ORDER — BIAFINE EX EMUL
CUTANEOUS | Status: DC | PRN
  Administered 2015-06-23: 10:00:00 via TOPICAL

## 2015-06-23 NOTE — Progress Notes (Addendum)
Patient has completed treatment total of 33  to right breast.Skin is very dark without peeling.Given biafine to apply 2 to 3 times daily.Denies pain except for chronic arthritis Has mild fatigue. BP 115/74 mmHg  Pulse 58  Temp(Src) 97.8 F (36.6 C)  Wt 231 lb 11.2 oz (105.098 kg)

## 2015-06-24 ENCOUNTER — Telehealth: Payer: Self-pay | Admitting: *Deleted

## 2015-06-24 NOTE — Progress Notes (Signed)
Name: [name]   MRN: [id number]  Date:  06/10/15   DOB: 27-Feb-1951  Status:outpatient    DIAGNOSIS: Breast cancer of lower-outer quadrant of right female breast   Staging form: Breast, AJCC 7th Edition     Clinical stage from 02/19/2015: Stage IA (T1b, N0, M0) - Unsigned       Staging comments: Staged at breast conference on 3.23.16      Pathologic stage from 03/24/2015: Stage IA (T1c, N0, cM0) - Signed by Enid Cutter, MD on 03/31/2015       Staging comments: Staged on final lumpectomy specimen by Dr. Donato Heinz.    CONSENT VERIFIED: yes   SET UP: Patient is setup supine   IMMOBILIZATION:  The following immobilization was used:Custom Moldable Pillow, breast board.   NARRATIVE: [name] underwent complex simulation and treatment planning for her boost treatment today.  Her tumor volume was outlined on the planning CT scan.  Due to the depth of her cavity, electrons could not be used and a photon plan was developed. The plan will be prescribed to the 100%  isodose line.   I personally supervised and approved the creation of 3 unique MLCs comprising 3   treatment devices.

## 2015-06-24 NOTE — Progress Notes (Signed)
  Radiation Oncology         [phone] ________________________________  Name: [name] MRN: [id number]  Date: 06/23/2015  DOB: [birth year]  End of Treatment Note  Diagnosis:   Breast cancer of lower-outer quadrant of right female breast   Staging form: Breast, AJCC 7th Edition     Clinical stage from 02/19/2015: Stage IA (T1b, N0, M0) - Unsigned       Staging comments: Staged at breast conference on 3.23.16      Pathologic stage from 03/24/2015: Stage IA (T1c, N0, cM0) - Signed by Enid Cutter, MD on 03/31/2015       Staging comments: Staged on final lumpectomy specimen by Dr. Donato Heinz.   Indication for treatment:  Curative      Radiation treatment dates:   05/07/2015-06/23/2015  Site/dose:   Right breast/ 45 Gy at 1.8 Gy per fraction x 25 fractions.  Right breast boost/ 16 Gy at 2 Gy per fraction x 8 fractions  Beams/energy:  Opposed tangents with reduced fields / 6 and 10 MV photons Three field with 6 and 10 MV photons  Narrative: The patient tolerated radiation treatment relatively well.   She had the expected dry desquamation with no moist desquamation. She had mild fatigue and no breast pain.   Plan: The patient has completed radiation treatment. The patient will return to radiation oncology clinic for routine followup in one month. I advised them to call or return sooner if they have any questions or concerns related to their recovery or treatment.  ------------------------------------------------  Thea Silversmith, MD

## 2015-06-24 NOTE — Telephone Encounter (Signed)
Spoke to pt concerning final xrt. Relate doing well and is without complaints. Encourage pt to call with questions or needs. Received verbal understanding. Confirmed future appts.

## 2015-06-27 ENCOUNTER — Other Ambulatory Visit (HOSPITAL_BASED_OUTPATIENT_CLINIC_OR_DEPARTMENT_OTHER): Payer: Medicare Other

## 2015-06-27 ENCOUNTER — Ambulatory Visit (HOSPITAL_BASED_OUTPATIENT_CLINIC_OR_DEPARTMENT_OTHER): Payer: Medicare Other | Admitting: Hematology

## 2015-06-27 ENCOUNTER — Telehealth: Payer: Self-pay | Admitting: Hematology

## 2015-06-27 ENCOUNTER — Encounter: Payer: Self-pay | Admitting: Hematology

## 2015-06-27 VITALS — BP 165/85 | HR 70 | Temp 98.0°F | Resp 18 | Ht 62.0 in | Wt 231.8 lb

## 2015-06-27 DIAGNOSIS — C50511 Malignant neoplasm of lower-outer quadrant of right female breast: Secondary | ICD-10-CM

## 2015-06-27 DIAGNOSIS — I10 Essential (primary) hypertension: Secondary | ICD-10-CM | POA: Diagnosis not present

## 2015-06-27 DIAGNOSIS — E119 Type 2 diabetes mellitus without complications: Secondary | ICD-10-CM

## 2015-06-27 DIAGNOSIS — Z170 Estrogen receptor positive status [ER+]: Secondary | ICD-10-CM

## 2015-06-27 DIAGNOSIS — C50811 Malignant neoplasm of overlapping sites of right female breast: Secondary | ICD-10-CM | POA: Diagnosis not present

## 2015-06-27 LAB — CBC WITH DIFFERENTIAL/PLATELET
BASO%: 0.4 % (ref 0.0–2.0)
Basophils Absolute: 0 10*3/uL (ref 0.0–0.1)
EOS%: 2.7 % (ref 0.0–7.0)
Eosinophils Absolute: 0.2 10*3/uL (ref 0.0–0.5)
HCT: 35.4 % (ref 34.8–46.6)
HGB: 11.3 g/dL — ABNORMAL LOW (ref 11.6–15.9)
LYMPH%: 25.2 % (ref 14.0–49.7)
MCH: 30.1 pg (ref 25.1–34.0)
MCHC: 31.9 g/dL (ref 31.5–36.0)
MCV: 94.4 fL (ref 79.5–101.0)
MONO#: 0.7 10*3/uL (ref 0.1–0.9)
MONO%: 9.4 % (ref 0.0–14.0)
NEUT#: 4.6 10*3/uL (ref 1.5–6.5)
NEUT%: 62.3 % (ref 38.4–76.8)
Platelets: 169 10*3/uL (ref 145–400)
RBC: 3.75 10*6/uL (ref 3.70–5.45)
RDW: 14.2 % (ref 11.2–14.5)
WBC: 7.3 10*3/uL (ref 3.9–10.3)
lymph#: 1.8 10*3/uL (ref 0.9–3.3)

## 2015-06-27 LAB — COMPREHENSIVE METABOLIC PANEL (CC13)
ALT: 18 U/L (ref 0–55)
AST: 19 U/L (ref 5–34)
Albumin: 3.9 g/dL (ref 3.5–5.0)
Alkaline Phosphatase: 147 U/L (ref 40–150)
Anion Gap: 8 mEq/L (ref 3–11)
BUN: 21.5 mg/dL (ref 7.0–26.0)
CALCIUM: 9.3 mg/dL (ref 8.4–10.4)
CHLORIDE: 106 meq/L (ref 98–109)
CO2: 25 meq/L (ref 22–29)
Creatinine: 1.1 mg/dL (ref 0.6–1.1)
EGFR: 60 mL/min/{1.73_m2} — ABNORMAL LOW (ref >90)
GLUCOSE: 158 mg/dL — AB (ref 70–140)
Potassium: 4.5 mEq/L (ref 3.5–5.1)
Sodium: 139 mEq/L (ref 136–145)
Total Bilirubin: 0.49 mg/dL (ref 0.20–1.20)
Total Protein: 7.7 g/dL (ref 6.4–8.3)

## 2015-06-27 MED ORDER — EXEMESTANE 25 MG PO TABS: 25.0000 mg | ORAL_TABLET | Freq: Every day | ORAL | Status: DC

## 2015-06-27 NOTE — Telephone Encounter (Signed)
Gave and printed appt sched and avs fo rpt for OCT °

## 2015-06-27 NOTE — Progress Notes (Signed)
[place]  [phone] Fax:([phone] [place] Note   Patient Care Team: Lind Covert, MD as PCP - General (Family Medicine) Harriet Masson, DPM (Podiatry) Gevena Cotton, MD (Ophthalmology) Rana Snare, MD (Urology) Dr. Edwin Cap ([place]) Fanny Skates, MD as Consulting Physician (General Surgery) Thea Silversmith, MD as Consulting Physician (Radiation Oncology) Mauro Kaufmann, RN as Registered Nurse Rockwell Germany, RN as Registered Nurse Holley Bouche, NP as Nurse Practitioner (Nurse Practitioner) Truitt Merle, MD as Consulting Physician (Hematology) 06/27/2015  CHIEF COMPLAINTS/PURPOSE OF CONSULTATION:  Follow up breast cancer  Oncology History   Breast cancer of lower-outer quadrant of right female breast   Staging form: Breast, AJCC 7th Edition     Clinical stage from 02/19/2015: Stage IA (T1b, N0, M0) - Unsigned       Staging comments: Staged at breast conference on 3.23.16      Pathologic stage from 03/24/2015: Stage IA (T1c, N0, cM0) - Signed by Enid Cutter, MD on 03/31/2015       Staging comments: Staged on final lumpectomy specimen by Dr. Donato Heinz.        Breast cancer of lower-outer quadrant of right female breast   02/03/2015 Breast US ultrasound is performed, showing a 5 x 5 x 5 mm irregular hypoechoic mass with internal color vascularity within the right breast 6 o'clock position 4 cm from the nipple   02/10/2015 Pathology Results INVASIVE DUCTAL CARCINOMA. - DUCTAL CARCINOMA IN SITU.   02/10/2015 Receptors her2 Estrogen Receptor: 100%, POSITIVE, STRONG STAINING INTENSITY Progesterone Receptor: 60%, POSITIVE, STRONG STAINING INTENSITY Proliferation Marker Ki67: 3%  HER-2/NEU BY CISH - NEGATIVE   02/12/2015 Breast MRI Biopsy-proven malignancy in the central right breast at posterior depth. 2. No MR findings to suggest multicentric or contralateral malignancy.   02/13/2015 Initial Diagnosis Breast cancer of  lower-outer quadrant of right female breast   03/21/2015 Surgery Right breast lumpectomy and sentinel lymph node biopsy, surgical margins were negative.   03/21/2015 Pathologic Stage Invasive ductal carcinoma, DCIS, negative for lymphovascular invasion. One lymph node were negative. Grade 1, tumor size 1.1 cm, ER 100% positive, PR 73 positive, HER-2 negative, Ki67 6%.   03/21/2015 Oncotype testing RS 7, low risk. It predicts 5% 10-year recurrence with Tamoxifen alone.    05/07/2015 - 06/23/2015 Radiation Therapy Right breast/ 45 Gy at 1.8 Gy per fraction x 25 fractions.  Right breast boost/ 16 Gy at 2 Gy per fraction x 8 fractions   06/02/2015 -  Anti-estrogen oral therapy Aromasin 25 mg once daily    CURRENT THERAPY: aromasin 33m daily, started on 06/02/2015   HISTORY OF PRESENTING ILLNESS:  Kirsten JILLSON64y.o. female is here because of newly diagnosed breast cancer.  This was discovered by screening mammogtram. Her last screening mammogram in February 2015 was normal.  The screening mammogram on 01/23/2015 showed a possible mass in the right breast. She underwent diagnostic mammogram and ultrasound on 02/03/2015, which showed a 5 x 5 x 5 mm irregular mass in the right breast 6:00 position.  Your biopsy of the mass showed invasive ductal carcinoma and DCIS.   She has multiple medical problems. She has chronic arthritis, with diffuse join pain, 8/10, she takes tylenol. No chest pain, (+) dyspena on moderate exertion, no nausea, abdominal bloating or change of her bowel habits. She is able to do most of her ADLs, limited light housework, not physically active, spends quite a bit of time sitting during the  daytime.  She was hospitalized for UTI in 9/15 and went to rehab for a month. She has been using crane more regularly after that.   INTERIM HISTORY: She returns for follow-up. She finished radiation on 7/25, and tolerated it very well overall. She has started Aromasin fron 7/4, no hot flush, she has  chronic knee and foot pain, which has not changed lately. She walks around with a cane. She fucntions well at home.  MEDICAL HISTORY:  Past Medical History  Diagnosis Date  . Hypertension   . Diabetes mellitus   . Depression   . Asthma   . Eczema   . Arthritis   . Cataract   . GERD (gastroesophageal reflux disease)   . History of kidney stones     w/ hx of hydronephrosis - followed by [facility]  . Anemia   . Obesity   . HIV nonspecific serology     2006: indeterminate HIV blood test, seen by ID, felt secondary to cross reacting antibodies with no further workup felt necessary at that time   . Fatty liver     Fatty infiltration of liver noted on 03/2012 CT scan  . CHF, acute 05/03/2012  . Breast cancer of lower-outer quadrant of right female breast 02/13/2015   GYN HISTORY  Menarchal: 11 LMP: Several years ago Contraceptive: 1979-1986 HRT: NO G3P2:  SURGICAL HISTORY: Past Surgical History  Procedure Laterality Date  . Cystoscopy w/ litholapaxy / ehl    . Cholecystectomy  2003  . Replacement total knee bilateral  2005 &2006  . Joint replacement      bilateral knee replacement  . Vascular surgery Right 03/15/2013    Ultrasound guided sclerotherapy  . Left and right heart catheterization with coronary angiogram N/A 05/02/2012    Procedure: LEFT AND RIGHT HEART CATHETERIZATION WITH CORONARY ANGIOGRAM;  Surgeon: Burnell Blanks, MD;  Location: [facility] CATH LAB;  Service: Cardiovascular;  Laterality: N/A;  . Radioactive seed guided mastectomy with axillary sentinel lymph node biopsy Right 03/21/2015    Procedure: RIGHT  PARTIAL MASTECTOMY WITH RADIOACTIVE SEED LOCALIZATION RIGHT  AXILLARY SENTINEL LYMPH NODE BIOPSY;  Surgeon: Fanny Skates, MD;  Location: [place];  Service: General;  Laterality: Right;    SOCIAL HISTORY: History   Social History  . Marital Status: Widowed     Spouse Name: [name]  . Number of Children: 3  . Years of Education: 14    Occupational History  . retired-CNA, housekeeping    Social History Main Topics  . Smoking status: Never Smoker   . Smokeless tobacco: Never Used  . Alcohol Use: No  . Drug Use: No  . Sexual Activity: No   Other Topics Concern  . Not on file   Social History Narrative   Health Care POA:    Emergency Contact: son, Willamina Grieshop [phone]   End of Life Plan:    Who lives with you:  husband Darrall Dears- Aaliya is his primary care giver   Any pets: [place]idian   Diet: Pt has a varied diet.  Is currently working on smaller portions sizes and no late night snacking for weight loss.   Exercise: Pt exercises 1x weekly with church group.   Seatbelts: Pt reports wearing seatbelt when in vehicles.    Hobbies: word searches, church, time with family and friends             FAMILY HISTORY: Family History  Problem Relation Age of Onset  . Diabetes  Mother   . Stroke Mother   . Heart disease Father   . Anemia Father   . Cancer Sister 17    nose cancer   . Cancer Cousin 30    ovarian cancer   . Cancer Cousin 56    ovarian cancer     ALLERGIES:  has No Known Allergies.  MEDICATIONS:  Current Outpatient Prescriptions  Medication Sig Dispense Refill  . acetaminophen (TYLENOL) 500 MG tablet Take 1,000 mg by mouth 2 (two) times daily.    Marland Kitchen albuterol (PROVENTIL HFA;VENTOLIN HFA) 108 (90 BASE) MCG/ACT inhaler Inhale 2 puffs into the lungs every 6 (six) hours as needed for wheezing or shortness of breath.    Marland Kitchen aspirin 81 MG tablet Take 81 mg by mouth daily.    . benazepril (LOTENSIN) 20 MG tablet Take 1 tablet (20 mg total) by mouth daily. 90 tablet 3  . colchicine 0.6 MG tablet take 1 tablet by mouth twice a day 60 tablet 3  . emollient (BIAFINE) cream Apply 90 g topically 2 (two) times daily.    Marland Kitchen exemestane (AROMASIN) 25 MG tablet Take 1 tablet (25 mg total) by mouth daily after breakfast. 30 tablet 5  . FLOVENT HFA 110 MCG/ACT inhaler inhale 2 puffs by mouth twice a day  12 g 6  . furosemide (LASIX) 40 MG tablet take 1 tablet by mouth once daily 90 tablet 1  . gabapentin (NEURONTIN) 100 MG capsule Take 2 capsules (200 mg total) by mouth 3 (three) times daily. 270 capsule 3  . metoprolol succinate (TOPROL-XL) 25 MG 24 hr tablet Take 1 tablet (25 mg total) by mouth daily. 90 tablet 3  . potassium citrate (UROCIT-K) 10 MEQ (1080 MG) SR tablet take 1 tablet by mouth twice a day 60 tablet 6  . ranitidine (ZANTAC) 150 MG tablet   1  . spironolactone (ALDACTONE) 25 MG tablet TAKE 1 TABLET BY MOUTH ONCE A DAY 30 tablet 6  . trolamine salicylate (ASPERCREME) 10 % cream Apply 1 application topically as needed for muscle pain.     Marland Kitchen HYDROcodone-acetaminophen (NORCO) 5-325 MG per tablet Take 1-2 tablets by mouth every 6 (six) hours as needed for moderate pain or severe pain. (Patient not taking: Reported on 06/11/2015) 30 tablet 0   No current facility-administered medications for this visit.    REVIEW OF SYSTEMS:   Constitutional: Denies fevers, chills or abnormal night sweats Eyes: Denies blurriness of vision, double vision or watery eyes Ears, nose, mouth, throat, and face: Denies mucositis or sore throat Respiratory: Denies cough, dyspnea or wheezes Cardiovascular: Denies palpitation, chest discomfort or lower extremity swelling Gastrointestinal:  Denies nausea, heartburn or change in bowel habits Skin: Denies abnormal skin rashes Lymphatics: Denies new lymphadenopathy or easy bruising Neurological:Denies numbness, tingling or new weaknesses Behavioral/Psych: Mood is stable, no new changes  All other systems were reviewed with the patient and are negative.  PHYSICAL EXAMINATION: ECOG PERFORMANCE STATUS: 2 - Symptomatic, <50% confined to bed  Filed Vitals:   06/27/15 0955  BP: 165/85  Pulse: 70  Temp: 98 F (36.7 C)  Resp: 18   Filed Weights   06/27/15 0955  Weight: 231 lb 12.8 oz (105.144 kg)    GENERAL:alert, no distress and comfortable SKIN: skin  color, texture, turgor are normal, no rashes or significant lesions EYES: normal, conjunctiva are pink and non-injected, sclera clear OROPHARYNX:no exudate, no erythema and lips, buccal mucosa, and tongue normal  NECK: supple, thyroid normal size, non-tender, without nodularity LYMPH:  no palpable lymphadenopathy in the cervical, axillary or inguinal LUNGS: clear to auscultation and percussion with normal breathing effort HEART: regular rate & rhythm and no murmurs and no lower extremity edema ABDOMEN:abdomen soft, non-tender and normal bowel sounds Musculoskeletal:no cyanosis of digits and no clubbing  PSYCH: alert & oriented x 3 with fluent speech NEURO: no focal motor/sensory deficits Breasts: Breast inspection showed them to be symmetrical with no nipple discharge. Lumpectomy surgical scar in right breast is healing well, (+) breast deformity in the inferior right breast secondary to surgery. (+) Diffuse skin pigmentation of the entire right breast and right axilla. Surgical wounds are healed well. Palpation of bilateral breasts feels lumpy, but no discrete mass. Palpitation of axilla revealed no obvious mass that I could appreciate.   LABORATORY DATA:  I have reviewed the data as listed Recent Labs  07/23/14 0806 07/24/14 0700 07/25/14 0655  09/18/14 1018 10/02/14 1019 01/22/15 1128 02/19/15 0831 05/30/15 0950 06/27/15 0926  NA 135* 139 139  < > 142 139 141 142 141 139  K 4.6 4.2 4.7  < > 4.7 4.5 4.7 4.6 4.6 4.5  CL 96 100 103  < > 106 102 104  --   --   --   CO2 _0 < > _1 GLUCOSE 133* 174* 118*  < > 120* 99 114* 131 152* 158*  BUN 29* 28* 19  < > 39* 16 32* 33.6* 27.4* 21.5  CREATININE 1.31* 1.20* 1.17*  < > 1.59* 1.19* 1.38* 1.3* 1.3* 1.1  CALCIUM 9.0 9.0 8.8  < > 9.3 9.5 9.5 9.2 9.6 9.3  GFRNONAA 42* 46* 48*  --   --   --   --   --   --   --   GFRAA 48* 54* 55*  --   --   --   --   --   --   --   PROT 7.4 7.1 7.1  < >  --   --  7.6 7.2 7.5 7.7    ALBUMIN 2.0* 2.0* 2.0*  < >  --   --  4.2 3.7 3.5 3.9  AST _2 < >  --   --  _3 ALT _4 < >  --   --  _5 ALKPHOS 157* 164* 139*  < >  --   --  119* 139 128 147  BILITOT 1.3* 0.7 0.4  < >  --   --  0.6 0.42 0.41 0.49  < > = values in this interval not displayed.  CBC Latest Ref Rng 06/27/2015 05/30/2015 02/19/2015  WBC 3.9 - 10.3 10e3/uL 7.3 11.8(H) 9.1  Hemoglobin 11.6 - 15.9 g/dL 11.3(L) 10.6(L) 10.8(L)  Hematocrit 34.8 - 46.6 % 35.4 33.5(L) 33.7(L)  Platelets 145 - 400 10e3/uL 169 205 176     Pathology 03/21/2015 Diagnosis 1. Breast, lumpectomy, right - INVASIVE DUCTAL CARCINOMA, SEE COMMENT. - DUCTAL CARCINOMA IN SITU. - NEGATIVE FOR LYMPH VASCULAR INVASION. - PREVIOUS BIOPSY SITE IDENTIFIED. - SEE TUMOR SYNOPTIC TEMPLATE BELOW. 2. Lymph node, sentinel, biopsy, right axillary - ONE LYMPH NODE, NEGATIVE FOR TUMOR (0/1).  1. BREAST, INVASIVE TUMOR, WITH LYMPH NODES PRESENT Specimen, including laterality and lymph node sampling (sentinel, non-sentinel): Right breast with sentinel lymph node sampling. Procedure: Lumpectomy. Histologic type: Grade: I of III Tubule formation: 1 Nuclear pleomorphism: 2 Mitotic:1 Tumor size (gross measurement): 1.1 cm Margins:  Invasive, distance to closest margin: 0.9 cm (medial). In-situ, distance to closest margin: Same as invasive. If margin positive, focally or broadly: N/A Lymphovascular invasion: Absent. Ductal carcinoma in situ: Present. Grade: I of III Extensive intraductal component: Absent. Lobular neoplasia: Absent. Tumor focality: Unifocal. Treatment effect: None. If present, treatment effect in breast tissue, lymph nodes or both: N/A Extent of tumor: Skin: N/A Nipple: N/A Skeletal muscle: N/A 2 of 4 FINAL for LYDIANA, MILLEY ([id number]) Microscopic Comment(continued) Lymph nodes: Examined: 1 Sentinel 0 Non-sentinel 1 Total Lymph nodes with metastasis: 0 Isolated tumor cells (< 0.2 mm):  N/A Micrometastasis: (> 0.2 mm and < 2.0 mm): N/A Macrometastasis: (> 2.0 mm): N/A Extracapsular extension: N/A Breast prognostic profile: Estrogen receptor: Not repeated, previous study demonstrated 100% positivity ([id number]). Progesterone receptor: Not repeated, previous study demonstrated 60% positivity ([id number]). Her 2 neu: Repeated, previous study demonstrated no amplification (1.70) ([id number]). Ki-67: Not repeated, previous study demonstrated 3% proliferation rate ([id number]). Non-neoplastic breast: Previous biopsy site related tissue changes. TNM: pT1c, pN0, pMX  Results: IMMUNOHISTOCHEMICAL AND MORPHOMETRIC ANALYSIS BY THE AUTOMATED CELLULAR IMAGING SYSTEM (ACIS) Estrogen Receptor: 100%, POSITIVE, STRONG STAINING INTENSITY Progesterone Receptor: 73%, POSITIVE, STRONG STAINING INTENSITY Proliferation Marker Ki67: 6%  1. CHROMOGENIC IN-SITU HYBRIDIZATION Results: HER-2/NEU BY CISH - NEGATIVE. RESULT RATIO OF HER2: CEP 17 SIGNALS 1.30 AVERAGE HER2 COPY NUMBER PER CELL 1.95  Oncotype Dx: RS 7, predicts 5% 10-year risk of distant recurrence with tamoxifen alone, low risk   RADIOGRAPHIC STUDIES: I have personally reviewed the radiological images as listed and agreed with the findings in the report.  Mr Breast Bilateral W Wo Contrast 02/13/2015    FINDINGS: Breast composition: Scattered fibroglandular densities.  Background parenchymal enhancement: The multiplanar, multi sequence bilateral breast MR demonstrates moderate background parenchymal enhancement with scattered foci of non mass enhancement bilaterally with benign MR features.  Right breast: A focus of metallic clip artifact is identified in the central right breast at posterior depth denoting site of biopsy-proven disease with mild associated post biopsy sequela. At the site of biopsy, examination demonstrates a small round homogeneously enhancing mass with irregular margins measuring approximately 6 x 6 x 6 mm  consistent with a residual disease. No additional suspicious mass or enhancement to suggest multifocal or multicentric disease.  Left breast: Scattered foci of non mass enhancement with benign MR features. No abnormal mass, suspicious enhancement, or morphologic abnormality to suggest contralateral disease.  Lymph nodes: The axillary and internal mammary lymph node chains are symmetric and unremarkable.  Ancillary findings:  None.   IMPRESSION: 1. Biopsy-proven malignancy in the central right breast at posterior depth. 2. No MR findings to suggest multicentric or contralateral malignancy. 3. No evidence of adenopathy.  RECOMMENDATION: Continued surgical management.  BI-RADS CATEGORY  6: Known biopsy-proven malignancy.   Electronically Signed   By: Andres Shad   On: 02/13/2015 10:44     02/03/2015 US breast  Targeted ultrasound is performed, showing a 5 x 5 x 5 mm irregular hypoechoic mass with internal color vascularity within the right breast 6 o'clock position 4 cm from the nipple. No right axillary lymphadenopathy.  IMPRESSION: Suspicious right breast mass.  01/23/2015  Mammogram  FINDINGS: In the right breast, a possible mass warrants further evaluation with spot compression views and possibly ultrasound. In the left breast, no findings suspicious for malignancy. Images were processed with CAD.  IMPRESSION: Further evaluation is suggested for possible mass in the right breast.  RECOMMENDATION: Diagnostic mammogram and possibly ultrasound of the right breast.  ASSESSMENT & PLAN:  64 year old postmenopausal African-American American female, screening detected right breast invasive ductal carcinoma and DCIS.  1. pT1c N0 M0, stage IA right breast invasive ductal carcinoma, ER positive PR positive HER-2 negative, Ki 67 6%, and DCIS -I discussed her imaging findings and surgical pathology results with her and her daughters in great details. -She has early-stage breast cancer, high  possibility of cured by the complete surgical resection. -Reviewed the possibility of local and distant recurrence after surgery, although it's low, we recommend adjuvant radiation and endocrine therapy to reduce her risk. -Her Oncotype was low risk, no need adjuvant chemotherapy -She has started Aromasin, tolerating well so far, we'll continue, plan for total 5 years. We discussed the survey transplant, with annual mammogram, soft and physician breast exam, I encouraged her to have healthy diet and exercise regularly.  2. Bone health -I discussed the side effect of all still pia from Aromasin - I encouraged her to continue calcium and vitamin D supplement  -We'll obtain a bone density scan   2. Hypertension, diabetes, arthritis, CHF -She will continue follow-up with her primary care physician  Follow up: -Return to clinic in 3 months with lab and a bone density scan   All questions were answered. The patient knows to call the clinic with any problems, questions or concerns. I spent 20 minutes counseling the patient face to face. The total time spent in the appointment was 25 minutes and more than 50% was on counseling.     Truitt Merle, MD 06/27/2015 10:39 AM

## 2015-07-01 ENCOUNTER — Ambulatory Visit: Payer: Medicare Other | Admitting: Hematology

## 2015-07-02 ENCOUNTER — Other Ambulatory Visit: Payer: Self-pay | Admitting: Family Medicine

## 2015-07-24 ENCOUNTER — Ambulatory Visit
Admission: RE | Admit: 2015-07-24 | Discharge: 2015-07-24 | Disposition: A | Discharge status: Home / Self Care | Payer: Medicare Other | Source: Ambulatory Visit | Attending: Radiation Oncology | Admitting: Radiation Oncology

## 2015-07-24 VITALS — BP 167/89 | HR 77 | Temp 97.6°F | Resp 20 | Ht 62.0 in | Wt 234.5 lb

## 2015-07-24 DIAGNOSIS — C50511 Malignant neoplasm of lower-outer quadrant of right female breast: Secondary | ICD-10-CM

## 2015-07-24 HISTORY — DX: Reserved for inherently not codable concepts without codable children: IMO0001

## 2015-07-24 HISTORY — DX: Reserved for concepts with insufficient information to code with codable children: IMO0002

## 2015-07-24 NOTE — Progress Notes (Signed)
Follow up s/p rad tx right breast, skin healed but still dark tanning, patient started Aromasin daily 06/27/15, using cocoa butter with vitamin e lotion, no pain,  See [name] 09/26/15 appetite good, Interested in [facility] but has transportatin issues, she doesn't drive so declined offer for next session 3:37 PM BP 167/89 mmHg  Pulse 77  Temp(Src) 97.6 F (36.4 C) (Oral)  Resp 20  Ht 5\' 2"  (1.575 m)  Wt 234 lb 8 oz (106.369 kg)  BMI 42.88 kg/m2\ Wt Readings from Last 3 Encounters:  07/24/15 234 lb 8 oz (106.369 kg)  06/27/15 231 lb 12.8 oz (105.144 kg)  06/23/15 231 lb 11.2 oz (105.098 kg)

## 2015-07-24 NOTE — Progress Notes (Signed)
   Department of Radiation Oncology  Phone:  [phone] Fax:        [phone]   Name: [name] MRN: [id number]  DOB: Apr 26, 1951  Date: 07/24/2015  Follow Up Visit Note  Diagnosis: Breast cancer of lower-outer quadrant of right female breast   Staging form: Breast, AJCC 7th Edition     Clinical stage from 02/19/2015: Stage IA (T1b, N0, M0) - Unsigned       Staging comments: Staged at breast conference on 3.23.16      Pathologic stage from 03/24/2015: Stage IA (T1c, N0, cM0) - Signed by Enid Cutter, MD on 03/31/2015       Staging comments: Staged on final lumpectomy specimen by Dr. Donato Heinz.  Summary and Interval since last radiation: 05/07/2015-06/23/2015 Right breast/ 45 Gy at 1.8 Gy per fraction x 25 fractions. Right breast boost/ 16 Gy at 2 Gy per fraction x 8 fractions. RT to her right ear after keloid surgery many years ago in [place]  Interval History: [name] presents today for routine followup. She started taking Aromasin daily since 06/27/15. She reports the txt area feels itchy and feels a shocking sensation at times as well. She states she uses cocoa butter and vitamin E lotion over the treatment area. She also reports chronic right wrist pain that has been present for the last 2 years from a prior injury. She uses a brace occasionally for it. She is scheduled to see [name] on 09/26/15. She reports a good appetite. She is interested in [facility] but has transportation issues. She doesn't drive, therefore she has declined the offer for the next session  Physical Exam:  Filed Vitals:   07/24/15 1532  BP: 167/89  Pulse: 77  Temp: 97.6 F (36.4 C)  TempSrc: Oral  Resp: 20  Height: 5\' 2"  (1.575 m)  Weight: 234 lb 8 oz (106.369 kg)   Alert and oriented times three. In no distress. Hyperpigmentation of the right breast.  IMPRESSION: [name] is a 64 y.o. female with Stage IA right breast cancer recovering from radiation therapy.  PLAN: She is doing well. We discussed the need for  follow up every 4-6 months which she has scheduled.  We discussed the need for yearly mammograms which she can schedule with her OBGYN or with medical oncology. We discussed the need for sun protection in the treated area.  She can always call me with questions.  I will follow up with her on an as needed basis. I have made another referral for the pt to attend physical therapy to address her arm/wrist pain.  This document serves as a record of services personally performed by Thea Silversmith, MD. It was created on her behalf by Darcus Austin, a trained medical scribe. The creation of this record is based on the scribe's personal observations and the provider's statements to them. This document has been checked and approved by the attending provider.   Thea Silversmith, MD

## 2015-07-29 ENCOUNTER — Telehealth: Payer: Self-pay | Admitting: *Deleted

## 2015-07-29 ENCOUNTER — Other Ambulatory Visit: Payer: Self-pay | Admitting: Nurse Practitioner

## 2015-07-29 DIAGNOSIS — C50511 Malignant neoplasm of lower-outer quadrant of right female breast: Secondary | ICD-10-CM

## 2015-07-29 NOTE — Telephone Encounter (Signed)
Called patient to inform of Pt appt. On 08-05-15 @ 1:45 pm @ [facility], spoke with patient and she is aware of this appt.

## 2015-08-01 ENCOUNTER — Telehealth: Payer: Self-pay | Admitting: Hematology

## 2015-08-01 NOTE — Telephone Encounter (Signed)
Called patient with survivorship appointment

## 2015-08-05 ENCOUNTER — Ambulatory Visit: Payer: Medicare Other | Attending: Radiation Oncology | Admitting: Physical Therapy

## 2015-08-05 DIAGNOSIS — R29898 Other symptoms and signs involving the musculoskeletal system: Secondary | ICD-10-CM

## 2015-08-05 DIAGNOSIS — M25631 Stiffness of right wrist, not elsewhere classified: Secondary | ICD-10-CM

## 2015-08-05 DIAGNOSIS — M25531 Pain in right wrist: Secondary | ICD-10-CM

## 2015-08-05 NOTE — Therapy (Signed)
[place], [place], [address] Phone: [phone]   Fax:  [phone]  Physical Therapy Evaluation  Patient Details  Name: [name] MRN: [id number] Date of Birth: 03-17-1951 Referring Provider:  Thea Silversmith, MD  Encounter Date: 08/05/2015      PT End of Session - 08/05/15 1734    Visit Number 1   Number of Visits 9   Date for PT Re-Evaluation 09/12/15   PT Start Time 0768   PT Stop Time 1438   PT Time Calculation (min) 50 min   Activity Tolerance Patient tolerated treatment well;Patient limited by pain   Behavior During Therapy [facility] for tasks assessed/performed      Past Medical History  Diagnosis Date  . Hypertension   . Diabetes mellitus   . Depression   . Asthma   . Eczema   . Arthritis   . Cataract   . GERD (gastroesophageal reflux disease)   . History of kidney stones     w/ hx of hydronephrosis - followed by [facility]  . Anemia   . Obesity   . HIV nonspecific serology     2006: indeterminate HIV blood test, seen by ID, felt secondary to cross reacting antibodies with no further workup felt necessary at that time   . Fatty liver     Fatty infiltration of liver noted on 03/2012 CT scan  . CHF, acute 05/03/2012  . Breast cancer of lower-outer quadrant of right female breast 02/13/2015  . Radiation 05/07/15-06/23/15    Right Breast    Past Surgical History  Procedure Laterality Date  . Cystoscopy w/ litholapaxy / ehl    . Cholecystectomy  2003  . Replacement total knee bilateral  2005 &2006  . Joint replacement      bilateral knee replacement  . Vascular surgery Right 03/15/2013    Ultrasound guided sclerotherapy  . Left and right heart catheterization with coronary angiogram N/A 05/02/2012    Procedure: LEFT AND RIGHT HEART CATHETERIZATION WITH CORONARY ANGIOGRAM;  Surgeon: Burnell Blanks, MD;  Location: [facility] CATH LAB;  Service: Cardiovascular;  Laterality: N/A;  .  Radioactive seed guided mastectomy with axillary sentinel lymph node biopsy Right 03/21/2015    Procedure: RIGHT  PARTIAL MASTECTOMY WITH RADIOACTIVE SEED LOCALIZATION RIGHT  AXILLARY SENTINEL LYMPH NODE BIOPSY;  Surgeon: Fanny Skates, MD;  Location: [place];  Service: General;  Laterality: Right;    There were no vitals filed for this visit.  Visit Diagnosis:  Pain in joint involving forearm, right - Plan: PT plan of care cert/re-cert  Stiffness of joint, forearm, right - Plan: PT plan of care cert/re-cert  Weakness of right arm - Plan: PT plan of care cert/re-cert      Subjective Assessment - 08/05/15 1356    Subjective Right wrist has bothered her for some time; gets tingling in fingers.  Doesn't see any swelling.  Wants to know about wearing the brace again.   Pertinent History Has a tremor in both hands, perhaps right > left.  Sprained right wrist bafdy when taking care ofher husband; had a brace that she wore and wonders if she should use it again. Got the brace when she was in [place] home about a year ago; they diagnosed gout.  Is on a medication for gout.  HTN controlled with meds.  Diagnosed with right breast cancer March 2016 with lumpectomy and SLNB 03/21/15.  Finished radiation 06/23/15. h/o kidney stones; cholecystectomy many  years ago; bilat. TKAs about 10 years ago.  h/o bilat. vein stripping about a year ago.  Surgery on both ears to remove cheloids.  CHF, asthma.                                                                                                                 Currently in Pain? Yes   Pain Score 5    Pain Location Wrist   Pain Orientation Right;Posterior   Pain Descriptors / Indicators Aching   Aggravating Factors  using it a lot   Pain Relieving Factors resting sometimes helps it; pain meds sometimes help            [facility] PT Assessment - 08/05/15 0001    Assessment   Medical Diagnosis Right breast cancer   Onset Date/Surgical  Date 03/21/15  lumpectomy   Hand Dominance Right   Precautions   Precautions Other (comment)  cancer precautions   Restrictions   Weight Bearing Restrictions No   Balance Screen   Has the patient fallen in the past 6 months No   Has the patient had a decrease in activity level because of a fear of falling?  No   Is the patient reluctant to leave their home because of a fear of falling?  No   Home Environment   Living Environment Private residence   Living Arrangements Children  daughter   Type of [place] to enter   Entrance Stairs-Number of Steps 2   Entrance Stairs-Rails --  has a pole on the porch, and her cane   Home Layout One level   Prior Function   Level of Independence Requires assistive device for independence  single point cane   Vocation Retired;On disability   Leisure walks 3x/week for about 30 minutes   Cognition   Overall Cognitive Status Within Functional Limits for tasks assessed   Observation/Other Assessments   Observations tremor in right > left hand   Quick DASH  31.82   Posture/Postural Control   Posture/Postural Control Postural limitations   Postural Limitations Rounded Shoulders;Forward head   ROM / Strength   AROM / PROM / Strength AROM;Strength   AROM   AROM Assessment Site Wrist   Right Shoulder Flexion 145 Degrees   Right Shoulder ABduction 142 Degrees   Right Shoulder Internal Rotation 58 Degrees   Right Shoulder External Rotation 67 Degrees   Left Shoulder Flexion 95 Degrees   Left Shoulder ABduction 74 Degrees   Right/Left Wrist Left   Right Wrist Extension 70 Degrees   Right Wrist Flexion 50 Degrees   Right Wrist Radial Deviation 15 Degrees   Right Wrist Ulnar Deviation 30 Degrees   Left Wrist Extension 70 Degrees   Left Wrist Flexion 65 Degrees   Left Wrist Radial Deviation 30 Degrees   Left Wrist Ulnar Deviation 35 Degrees   Strength   Strength Assessment Site Wrist   Right/Left Wrist Right;Left   Right  Wrist Flexion 5/5  painful at posterior/ulnar  aspect   Right Wrist Extension 5/5   Right Wrist Radial Deviation 4/5   Right Wrist Ulnar Deviation 4/5  painful   Left Wrist Flexion 5/5   Left Wrist Extension 5/5   Left Wrist Radial Deviation 5/5   Left Wrist Ulnar Deviation 5/5   Palpation   Palpation comment tender at ulnar aspect of wrist with palpation   Ambulation/Gait   Ambulation/Gait Yes   Ambulation/Gait Assistance 6: Modified independent (Device/Increase time)   Assistive device Straight cane           LYMPHEDEMA/ONCOLOGY QUESTIONNAIRE - 08/05/15 1417    What other symptoms do you have   Are you having pitting edema No   Right Upper Extremity Lymphedema   10 cm Proximal to Olecranon Process 34.3 cm   Olecranon Process 28.7 cm   10 cm Proximal to Ulnar Styloid Process 24.3 cm   Just Proximal to Ulnar Styloid Process 20.4 cm   Across Hand at [employer] 20.8 cm   At [place] of 2nd Digit 7.5 cm   Left Upper Extremity Lymphedema   10 cm Proximal to Olecranon Process 30.8 cm   Olecranon Process 25.7 cm   10 cm Proximal to Ulnar Styloid Process 22.1 cm   Just Proximal to Ulnar Styloid Process 19.4 cm   Across Hand at [employer] 21.1 cm   At [place] of 2nd Digit 7.7 cm           Quick Dash - 08/05/15 0001    Open a tight or new jar Mild difficulty   Do heavy household chores (wash walls, wash floors) Moderate difficulty   Carry a shopping bag or briefcase Mild difficulty   Wash your back Mild difficulty   Use a knife to cut food Moderate difficulty   Recreational activities in which you take some force or impact through your arm, shoulder, or hand (golf, hammering, tennis) Mild difficulty   During the past week, to what extent has your arm, shoulder or hand problem interfered with your normal social activities with family, friends, neighbors, or groups? Not at all   During the past week, to what extent has your arm, shoulder or hand problem limited your work  or other regular daily activities Not at all   Arm, shoulder, or hand pain. Moderate   Tingling (pins and needles) in your arm, shoulder, or hand Moderate   Difficulty Sleeping Moderate difficulty   DASH Score 31.82 %                           Breast Clinic Goals - 02/19/15 1518    Patient will be able to verbalize understanding of pertinent lymphedema risk reduction practices relevant to her diagnosis specifically related to skin care.   Time 1   Period Days   Status Achieved   Patient will be able to return demonstrate and/or verbalize understanding of the post-op home exercise program related to regaining shoulder range of motion.   Time 1   Period Days   Status Achieved   Patient will be able to verbalize understanding of the importance of attending the postoperative After Breast Cancer Class for further lymphedema risk reduction education and therapeutic exercise.   Time 1   Period Days   Status Achieved          Long Term Clinic Goals - 08/05/15 1742    CC Long Term Goal  #1   Title Pt. will report  at least 60% decrease in right wrist pain.   Time 4   Period Weeks   Status New   CC Long Term Goal  #2   Title Pt. will show strength at 5/5 at right wrist, and without pain on manual muscle testing.   Time 4   Period Weeks   Status New   CC Long Term Goal  #3   Title Right wrist flexion to at least 60 degrees.   Time 4   Period Weeks   Status New   CC Long Term Goal  #4   Title Reduce quick DASH score to less than 20, showing improved function.   Baseline 32   Time 4   Period Weeks   Status New            Plan - August 27, 2015 1735    Clinical Impression Statement Patient who has completed treatment for right breast cancer now with c/o right wrist pain at posterior/ulnar aspect.  Right wrist ROM is limited, as is strength, and wrist is tender to palpation.  Patient also has an occasional hand tremor of unknown cause.  Her shoulder AROM is fairly  good post-treatment and her arm circumference measurements are higher than pre-op, but parallel those measurements,so she doesn't appear to have lymphedema.  She has gained some weight and she also has CHF.   Pt will benefit from skilled therapeutic intervention in order to improve on the following deficits Pain;Decreased range of motion;Decreased strength;Impaired UE functional use   Rehab Potential Good   PT Frequency 2x / week   PT Duration 4 weeks   PT Treatment/Interventions Passive range of motion;Therapeutic exercise;Manual techniques;Patient/family education;Iontophoresis 4mg /ml Dexamethasone;Electrical Stimulation   PT Next Visit Plan Begin with right wrist ROM and instructing patient in HEP for that; trial of soft tissue work to the area; consider iontophoresis as needed; electrical stimulation prn pain.  Progress to right wrist strengthening as ROM and pain improve.   Consulted and Agree with Plan of Care Patient          G-Codes - 08-27-2015 1745    Functional Assessment Tool Used quick dash   Functional Limitation Carrying, moving and handling objects   Carrying, Moving and Handling Objects Current Status [phone]) At least 20 percent but less than 40 percent impaired, limited or restricted   Carrying, Moving and Handling Objects Goal Status ([id number]) At least 1 percent but less than 20 percent impaired, limited or restricted       Problem List Patient Active Problem List   Diagnosis Date Noted  . Breast cancer of lower-outer quadrant of right female breast 02/13/2015  . Vitamin D deficiency 02/06/2015  . Falls frequently 01/22/2015  . Acute gouty arthritis 07/26/2014  . Type 2 diabetes mellitus with diabetic foot deformity 01/01/2014  . Systolic CHF, chronic 12/45/8099  . OBESITY 02/05/2009  . ASTHMA, PERSISTENT, MILD 02/08/2008  . Depression 05/18/2007  . HYPERTENSION, BENIGN SYSTEMIC 01/26/2007  . NEPHROLITHIASIS 01/26/2007  . DJD (degenerative joint disease), multiple  sites 01/26/2007    [facility] 2015-08-27, 5:47 PM  [place], [place], [address] Phone: [phone]   Fax:  [place], PT Aug 27, 2015 5:47 PM

## 2015-08-07 ENCOUNTER — Ambulatory Visit: Payer: Medicare Other | Admitting: Podiatry

## 2015-08-11 ENCOUNTER — Ambulatory Visit: Payer: Medicare Other | Admitting: Physical Therapy

## 2015-08-11 DIAGNOSIS — M25631 Stiffness of right wrist, not elsewhere classified: Secondary | ICD-10-CM

## 2015-08-11 DIAGNOSIS — R29898 Other symptoms and signs involving the musculoskeletal system: Secondary | ICD-10-CM

## 2015-08-11 DIAGNOSIS — M25531 Pain in right wrist: Secondary | ICD-10-CM

## 2015-08-11 NOTE — Patient Instructions (Signed)
Pt told Dr. Unknown Jim office would call her to schedule an appointment for her to come in

## 2015-08-11 NOTE — Therapy (Signed)
[place], [place], [address] Phone: [phone]   Fax:  [phone]  Physical Therapy Treatment  Patient Details  Name: [name] MRN: [id number] Date of Birth: 09-09-1951 Referring Provider:  Thea Silversmith, MD  Encounter Date: 08/11/2015      PT End of Session - 08/11/15 1457    Visit Number 2   Number of Visits 9   Date for PT Re-Evaluation 09/12/15   PT Start Time 1350   PT Stop Time 1425   PT Time Calculation (min) 35 min   Activity Tolerance Patient limited by pain   Behavior During Therapy [facility] for tasks assessed/performed      Past Medical History  Diagnosis Date  . Hypertension   . Diabetes mellitus   . Depression   . Asthma   . Eczema   . Arthritis   . Cataract   . GERD (gastroesophageal reflux disease)   . History of kidney stones     w/ hx of hydronephrosis - followed by [facility]  . Anemia   . Obesity   . HIV nonspecific serology     2006: indeterminate HIV blood test, seen by ID, felt secondary to cross reacting antibodies with no further workup felt necessary at that time   . Fatty liver     Fatty infiltration of liver noted on 03/2012 CT scan  . CHF, acute 05/03/2012  . Breast cancer of lower-outer quadrant of right female breast 02/13/2015  . Radiation 05/07/15-06/23/15    Right Breast    Past Surgical History  Procedure Laterality Date  . Cystoscopy w/ litholapaxy / ehl    . Cholecystectomy  2003  . Replacement total knee bilateral  2005 &2006  . Joint replacement      bilateral knee replacement  . Vascular surgery Right 03/15/2013    Ultrasound guided sclerotherapy  . Left and right heart catheterization with coronary angiogram N/A 05/02/2012    Procedure: LEFT AND RIGHT HEART CATHETERIZATION WITH CORONARY ANGIOGRAM;  Surgeon: Burnell Blanks, MD;  Location[facility] CATH LAB;  Service: Cardiovascular;  Laterality: N/A;  . Radioactive seed guided  mastectomy with axillary sentinel lymph node biopsy Right 03/21/2015    Procedure: RIGHT  PARTIAL MASTECTOMY WITH RADIOACTIVE SEED LOCALIZATION RIGHT  AXILLARY SENTINEL LYMPH NODE BIOPSY;  Surgeon: Fanny Skates, MD;  Location: [place];  Service: General;  Laterality: Right;    There were no vitals filed for this visit.  Visit Diagnosis:  Pain in joint involving forearm, right  Stiffness of joint, forearm, right  Weakness of right arm      Subjective Assessment - 08/11/15 1400    Subjective pt reports that she has had some pain in her right breast that started over the past couple of days and wants to know if we think she should go see [name] about it.    Pertinent History Has a tremor in both hands, perhaps right > left.  Sprained right wrist bafdy when taking care ofher husband; had a brace that she wore and wonders if she should use it again. Got the brace when she was in [place] home about a year ago; they diagnosed gout.  Is on a medication for gout.  HTN controlled with meds.  Diagnosed with right breast cancer March 2016 with lumpectomy and SLNB 03/21/15.  Finished radiation 06/23/15. h/o kidney stones; cholecystectomy many years ago; bilat. TKAs about 10 years ago.  h/o bilat. vein stripping about  a year ago.  Surgery on both ears to remove cheloids.  CHF, asthma.                                                                                                                 Currently in Pain? Yes   Pain Score 7    Pain Location Wrist  pain all over really    Pain Orientation Right   Pain Descriptors / Indicators Aching   Pain Type Chronic pain   Aggravating Factors  using it alot.   Pain Relieving Factors sometimes feels better when she props it up.             [facility] PT Assessment - 08/11/15 0001    Palpation   Palpation comment tender firmness felt at right breast medical to axillary incision. about 1.5 cm in curving pattern along top of  breast.  Asked Nestor Ramp, PT to palpate also and decided to call Dr. Unknown Jim office to have it checked out           [facility] Adult PT Treatment/Exercise - 08/11/15 0001    Shoulder Exercises: Supine   Protraction AROM;5 reps   Wrist Exercises   Forearm Supination 10 reps;AROM;Right   Forearm Pronation AROM;Right;10 reps   Wrist Flexion AROM;Right;10 reps   Wrist Extension AROM;AAROM;Right;5 reps  painful    Wrist Radial Deviation AROM;Right;5 reps   Wrist Ulnar Deviation AROM;Right;5 reps   Manual Therapy   Manual Therapy Joint mobilization   Manual therapy comments gentle brief skin stretch and soft tissue mobilizaion   Joint Mobilization gentle wrist distraction and carpal bone glides                         Long Term Clinic Goals - 08/05/15 1742    CC Long Term Goal  #1   Title Pt. will report at least 60% decrease in right wrist pain.   Time 4   Period Weeks   Status New   CC Long Term Goal  #2   Title Pt. will show strength at 5/5 at right wrist, and without pain on manual muscle testing.   Time 4   Period Weeks   Status New   CC Long Term Goal  #3   Title Right wrist flexion to at least 60 degrees.   Time 4   Period Weeks   Status New   CC Long Term Goal  #4   Title Reduce quick DASH score to less than 20, showing improved function.   Baseline 32   Time 4   Period Weeks   Status New            Plan - 08/11/15 1457    Clinical Impression Statement Pt continues to have pain in wrist, but also discovered to have very tender firm area in right breast.  Called Dr. Unknown Jim office and nurse took patient phone number as they want to call for pt to come in for an appointment  Pt will benefit from skilled therapeutic intervention in order to improve on the following deficits Pain;Decreased range of motion;Decreased strength;Impaired UE functional use   Clinical Impairments Affecting Rehab Potential radiation therapy   PT Next Visit Plan  check to see results of pt visit with [name] If cleared, .Begin with right wrist ROM and instructing patient in HEP for that; trial of soft tissue work to the area; consider iontophoresis as needed; electrical stimulation prn pain.  Progress to right wrist strengthening as ROM and pain improve.        Problem List Patient Active Problem List   Diagnosis Date Noted  . Breast cancer of lower-outer quadrant of right female breast 02/13/2015  . Vitamin D deficiency 02/06/2015  . Falls frequently 01/22/2015  . Acute gouty arthritis 07/26/2014  . Type 2 diabetes mellitus with diabetic foot deformity 01/01/2014  . Systolic CHF, chronic 42/39/5320  . OBESITY 02/05/2009  . ASTHMA, PERSISTENT, MILD 02/08/2008  . Depression 05/18/2007  . HYPERTENSION, BENIGN SYSTEMIC 01/26/2007  . NEPHROLITHIASIS 01/26/2007  . DJD (degenerative joint disease), multiple sites 01/26/2007   Donato Heinz. Owens Shark, PT  08/11/2015, 3:07 PM  [place], [place], [address] Phone: [phone]   Fax:  [phone]

## 2015-08-12 ENCOUNTER — Ambulatory Visit: Payer: Medicare Other

## 2015-08-13 ENCOUNTER — Ambulatory Visit (INDEPENDENT_AMBULATORY_CARE_PROVIDER_SITE_OTHER): Payer: Medicare Other | Admitting: Family Medicine

## 2015-08-13 ENCOUNTER — Encounter: Payer: Self-pay | Admitting: Family Medicine

## 2015-08-13 VITALS — BP 127/75 | HR 85 | Temp 97.9°F | Wt 230.1 lb

## 2015-08-13 DIAGNOSIS — I10 Essential (primary) hypertension: Secondary | ICD-10-CM

## 2015-08-13 DIAGNOSIS — E785 Hyperlipidemia, unspecified: Secondary | ICD-10-CM | POA: Diagnosis not present

## 2015-08-13 DIAGNOSIS — Z13220 Encounter for screening for lipoid disorders: Secondary | ICD-10-CM

## 2015-08-13 DIAGNOSIS — Z1159 Encounter for screening for other viral diseases: Secondary | ICD-10-CM | POA: Diagnosis not present

## 2015-08-13 DIAGNOSIS — Z23 Encounter for immunization: Secondary | ICD-10-CM | POA: Diagnosis not present

## 2015-08-13 DIAGNOSIS — E1169 Type 2 diabetes mellitus with other specified complication: Secondary | ICD-10-CM

## 2015-08-13 DIAGNOSIS — M21969 Unspecified acquired deformity of unspecified lower leg: Secondary | ICD-10-CM

## 2015-08-13 LAB — LDL CHOLESTEROL, DIRECT: LDL DIRECT: 103 mg/dL (ref <130)

## 2015-08-13 MED ORDER — METOPROLOL SUCCINATE ER 25 MG PO TB24: 25.0000 mg | ORAL_TABLET | Freq: Every day | ORAL | Status: DC

## 2015-08-13 NOTE — Assessment & Plan Note (Signed)
Check A1c.  May need metformin

## 2015-08-13 NOTE — Assessment & Plan Note (Addendum)
Check ldl - likely will need statin   Will start lipitor

## 2015-08-13 NOTE — Patient Instructions (Addendum)
Good to see you today!  Thanks for coming in.  Stop the metoprolol tartrate twice daily and take Metorpolol XL once daily   Great the breast cancer treatment is almost done  I will call you if your tests are not good.  Otherwise I will send you a letter.  If you do not hear from me with in 2 weeks please call our office.     Keep taking the 1/2 of furosemide  Come back in 3 months

## 2015-08-13 NOTE — Progress Notes (Signed)
   Subjective:    Patient ID: SHANTRELL PLACZEK, female    DOB: July 13, 1951, 64 y.o.   MRN: [id number]  HPI  HYPERTENSION Disease Monitoring: Blood pressure range-not checking Chest pain, palpitations- no      Dyspnea- no Medications: Compliance- daily Lightheadedness,Syncope- no   Edema- no no change with decreased lasix  DIABETES Disease Monitoring: Blood Sugar ranges-not checking Polyuria/phagia/dipsia- no      Visual problems- no Medications: Compliance- trying to watch diet Hypoglycemic symptoms- no  HYPERLIPIDEMIA Disease Monitoring: See symptoms for Hypertension Medications: Compliance- no current meds Right upper quadrant pain- no  Muscle aches- no  Monitoring Labs and Parameters Last A1C:  Lab Results  Component Value Date   HGBA1C 7.0 06/11/2015    Last Lipid:     Component Value Date/Time   CHOL 236* 09/11/2014 0944   HDL 45 09/11/2014 0944    Last Bmet  POTASSIUM  Date Value Ref Range Status  06/27/2015 4.5 3.5 - 5.1 mEq/L Final  01/22/2015 4.7 3.5 - 5.3 mEq/L Final   SODIUM  Date Value Ref Range Status  06/27/2015 139 136 - 145 mEq/L Final  01/22/2015 141 135 - 145 mEq/L Final   CREATININE  Date Value Ref Range Status  06/27/2015 1.1 0.6 - 1.1 mg/dL Final   CREAT  Date Value Ref Range Status  01/22/2015 1.38* 0.50 - 1.10 mg/dL Final   CREATININE, SER  Date Value Ref Range Status  07/25/2014 1.17* 0.50 - 1.10 mg/dL Final      Last BPs:  BP Readings from Last 3 Encounters:  08/13/15 127/75  07/24/15 167/89  06/27/15 165/85    Chief Complaint noted Review of Symptoms - see HPI PMH - Smoking status noted.   Vital Signs reviewed    Review of Systems     Objective:   Physical Exam  Alert nad Heart - Regular rate and rhythm.  No murmurs, gallops or rubs.    Lungs:  Normal respiratory effort, chest expands symmetrically. Lungs are clear to auscultation, no crackles or wheezes. Extremities:  No cyanosis, edema,        Assessment  & Plan:

## 2015-08-13 NOTE — Assessment & Plan Note (Signed)
At goal but taking wrong metoprolol.  Reordered correct one

## 2015-08-14 ENCOUNTER — Encounter: Payer: Self-pay | Admitting: Family Medicine

## 2015-08-14 LAB — HEPATITIS C ANTIBODY: HCV AB: NEGATIVE

## 2015-08-14 MED ORDER — ATORVASTATIN CALCIUM 40 MG PO TABS: 40.0000 mg | ORAL_TABLET | Freq: Every day | ORAL | Status: DC

## 2015-08-14 NOTE — Addendum Note (Signed)
Addended by: Talbert Cage L on: 08/14/2015 04:18 PM   Modules accepted: Orders

## 2015-08-15 ENCOUNTER — Ambulatory Visit (HOSPITAL_BASED_OUTPATIENT_CLINIC_OR_DEPARTMENT_OTHER): Payer: Medicare Other | Admitting: Nurse Practitioner

## 2015-08-15 ENCOUNTER — Encounter: Payer: Self-pay | Admitting: Nurse Practitioner

## 2015-08-15 VITALS — BP 115/76 | HR 61 | Temp 98.2°F | Resp 17 | Ht 62.0 in | Wt 232.0 lb

## 2015-08-15 DIAGNOSIS — C50511 Malignant neoplasm of lower-outer quadrant of right female breast: Secondary | ICD-10-CM | POA: Diagnosis not present

## 2015-08-15 NOTE — Progress Notes (Signed)
CLINIC:  Cancer Survivorship   REASON FOR VISIT:  Routine follow-up post-treatment for a recent history of breast cancer.  BRIEF ONCOLOGIC HISTORY:  Oncology History     Breast cancer of lower-outer quadrant of right female breast   Staging form: Breast, AJCC 7th Edition     Clinical stage from 02/19/2015: Stage IA (T1b, N0, M0) - Unsigned       Staging comments: Staged at breast conference on 3.23.16      Pathologic stage from 03/24/2015: Stage IA (T1c, N0, cM0) - Signed by Enid Cutter, MD on 03/31/2015       Staging comments: Staged on final lumpectomy specimen by Dr. Donato Heinz.      Breast cancer of lower-outer quadrant of right female breast   01/23/2015 Mammogram Possible mass in right breast warrants further evaluation.  No findings in the left breast suspicious for malignancy   02/03/2015 Breast US Right breast: 5 x 5 x 5 mm irregular hypoechoic mass with internal color vascularity at 6 o'clock position, 4 cm from the nipple   02/10/2015 Initial Biopsy Right needle core bx LOQ: Invasive ductal carcinoma, grade 1, ER+ (100%), PR+ (60%), HER2/neu negative (ratio 1.13), Ki67 3%; DCIS.     02/12/2015 Breast MRI Biopsy-proven malignancy in the central right breast at posterior depth. No MR findings to suggest multicentric or contralateral malignancy.   02/12/2015 Clinical Stage Stage IA: T1b N0   03/21/2015 Definitive Surgery Right breast lumpectomy / SLNB Dalbert Batman): Invasive ductal carcinoma, grade 1, 1.1 cm, ER+ (100%), PR+ (73%), HER-2 negative (ratio 1.3), Ki67 6%, negative margins / no lymphovascular invasion; DCIS.  One lymph node negative for tumor (0/1).     03/21/2015 Oncotype testing RS 7, low risk. (ROR 5% for 10-year recurrence with Tamoxifen alone).    03/21/2015 Pathologic Stage Stage IA: pT1c pN0   05/07/2015 - 06/23/2015 Radiation Therapy Adjuvant RT completed Pablo Ledger): Right breast 45 Gy over 25 fractions.  Right breast boost 16 Gy over 8 fractions. Total dose: 60 Gy.   06/02/2015 -   Anti-estrogen oral therapy Aromasin 25 mg once daily Burr Medico).  Planned duration of therapy: 5 years.    INTERVAL HISTORY:  Ms. Mell presents to the [facility] today for our initial meeting to review her survivorship care plan detailing her treatment course for breast cancer, as well as monitoring long-term side effects of that treatment, education regarding health maintenance, screening, and overall wellness and health promotion.     Overall, Ms. Goranson reports feeling quite well since completing her radiation therapy approximately seven weeks ago.  She reports the development of some pain along her right underarm / upper chest wall following completion of radiation, which is accompanied by some skin thickening.  She denies redness or increased warmth in the area. She reports some tingling in her right hand, which is intermittent. She continues with fatigue, which is worse in the morning, and has had some intermittent difficulties with sleeping.  She is under increased stress due to her sister's recent cancer recurrence. Ms. Deshazo denies any cough, shortness of breath, change in appetite or weight, or other complaints of pain. She denies any mass or nodularity in either breast. She has begun her exemestane and is tolerating it well, specifically denying any hot flashes, vaginal dryness, or worsening of her joint pain. She is scheduled to see [name] next week in follow up and will see [name] in October.    REVIEW OF SYSTEMS:  General: Denies night sweats.  Cardiac:  Mild lower extremity edema bilaterally.  Denies palpitations or chest pain. Respiratory: Denies dyspnea on exertion.  GI: Denies abdominal pain, constipation, diarrhea, nausea, or vomiting.  GU: Denies dysuria, hematuria, vaginal bleeding, or vaginal discharge.  Musculoskeletal: Ongoing joint pain related to her osteoarthritis, which is unchanged.  Right underarm / chest wall pain as above.  Otherwise, denies pain. Neuro:  Denies headache or recent falls.  Skin: Denies rash, pruritis, or open wounds.  Breast: Denies any new nodularity, masses, nipple changes, or nipple discharge.  Psych: Denies depression or memory loss.   A 14-point review of systems was completed and was negative, except as noted above.   ONCOLOGY TREATMENT TEAM:  1. Surgeon:  Dr. Dalbert Batman at [facility]  2. Medical Oncologist: [name] 3. Radiation Oncologist: [name]    PAST MEDICAL/SURGICAL HISTORY:  Past Medical History  Diagnosis Date  . Hypertension   . Diabetes mellitus   . Depression   . Asthma   . Eczema   . Arthritis   . Cataract   . GERD (gastroesophageal reflux disease)   . History of kidney stones     w/ hx of hydronephrosis - followed by [facility]  . Anemia   . Obesity   . HIV nonspecific serology     2006: indeterminate HIV blood test, seen by ID, felt secondary to cross reacting antibodies with no further workup felt necessary at that time   . Fatty liver     Fatty infiltration of liver noted on 03/2012 CT scan  . CHF, acute 05/03/2012  . Breast cancer of lower-outer quadrant of right female breast 02/13/2015  . Radiation 05/07/15-06/23/15    Right Breast   Past Surgical History  Procedure Laterality Date  . Cystoscopy w/ litholapaxy / ehl    . Cholecystectomy  2003  . Replacement total knee bilateral  2005 &2006  . Joint replacement      bilateral knee replacement  . Vascular surgery Right 03/15/2013    Ultrasound guided sclerotherapy  . Left and right heart catheterization with coronary angiogram N/A 05/02/2012    Procedure: LEFT AND RIGHT HEART CATHETERIZATION WITH CORONARY ANGIOGRAM;  Surgeon: Burnell Blanks, MD;  Location: [facility] CATH LAB;  Service: Cardiovascular;  Laterality: N/A;  . Radioactive seed guided mastectomy with axillary sentinel lymph node biopsy Right 03/21/2015    Procedure: RIGHT  PARTIAL MASTECTOMY WITH RADIOACTIVE SEED LOCALIZATION RIGHT  AXILLARY SENTINEL  LYMPH NODE BIOPSY;  Surgeon: Fanny Skates, MD;  Location: [facility];  Service: General;  Laterality: Right;     ALLERGIES:  No Known Allergies   CURRENT MEDICATIONS:  Current Outpatient Prescriptions on File Prior to Visit  Medication Sig Dispense Refill  . acetaminophen (TYLENOL) 500 MG tablet Take 1,000 mg by mouth 2 (two) times daily.    Marland Kitchen albuterol (PROVENTIL HFA;VENTOLIN HFA) 108 (90 BASE) MCG/ACT inhaler Inhale 2 puffs into the lungs every 6 (six) hours as needed for wheezing or shortness of breath.    Marland Kitchen aspirin 81 MG tablet Take 81 mg by mouth daily.    Marland Kitchen atorvastatin (LIPITOR) 40 MG tablet Take 1 tablet (40 mg total) by mouth daily. 90 tablet 3  . benazepril (LOTENSIN) 20 MG tablet take 1 tablet by mouth once daily 90 tablet 3  . colchicine 0.6 MG tablet take 1 tablet by mouth twice a day 60 tablet 3  . emollient (BIAFINE) cream Apply 90 g topically 2 (two) times daily.    Marland Kitchen exemestane (AROMASIN) 25  MG tablet Take 1 tablet (25 mg total) by mouth daily after breakfast. 30 tablet 5  . FLOVENT HFA 110 MCG/ACT inhaler inhale 2 puffs by mouth twice a day 12 g 6  . furosemide (LASIX) 40 MG tablet take 1 tablet by mouth once daily 90 tablet 1  . gabapentin (NEURONTIN) 100 MG capsule Take 2 capsules (200 mg total) by mouth 3 (three) times daily. 270 capsule 3  . HYDROcodone-acetaminophen (NORCO) 5-325 MG per tablet Take 1-2 tablets by mouth every 6 (six) hours as needed for moderate pain or severe pain. 30 tablet 0  . metoprolol succinate (TOPROL-XL) 25 MG 24 hr tablet Take 1 tablet (25 mg total) by mouth daily. 90 tablet 3  . potassium citrate (UROCIT-K) 10 MEQ (1080 MG) SR tablet take 1 tablet by mouth twice a day 60 tablet 6  . ranitidine (ZANTAC) 150 MG tablet Take 150 mg by mouth at bedtime.   1  . spironolactone (ALDACTONE) 25 MG tablet TAKE 1 TABLET BY MOUTH ONCE A DAY 30 tablet 6  . trolamine salicylate (ASPERCREME) 10 % cream Apply 1 application topically as  needed for muscle pain.      No current facility-administered medications on file prior to visit.     ONCOLOGIC FAMILY HISTORY:  Family History  Problem Relation Age of Onset  . Diabetes Mother   . Stroke Mother   . Heart disease Father   . Anemia Father   . Cancer Sister 17    nose cancer   . Cancer Cousin 30    ovarian cancer   . Cancer Cousin 49    ovarian cancer      GENETIC COUNSELING/TESTING: None  SOCIAL HISTORY:  DENIKA KRONE is widowed and lives alone in [place], [place].  She has 2 children.  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  She denies any current or history of tobacco, alcohol, or illicit drug use.     PHYSICAL EXAMINATION:  Vital Signs: Filed Vitals:   08/15/15 1018 08/15/15 1145  BP: 155/99 115/76  Pulse: 61   Temp: 98.2 F (36.8 C)   TempSrc: Oral   Resp: 17   Height: 5' 2"  (1.575 m)   Weight: 232 lb (105.235 kg)   SpO2: 100%      General: Well-nourished, well-appearing female in no acute distress.  She is unaccompanied in clinic today.   HEENT: Head is atraumatic and normocephalic.  Pupils equal and reactive to light and accomodation. Conjunctivae clear without exudate.  Sclerae anicteric. Oral mucosa is pink, moist, and intact without lesions.  Oropharynx is pink without lesions or erythema.  Lymph: No cervical, supraclavicular, infraclavicular, or axillary lymphadenopathy noted on palpation.  Cardiovascular: Regular rate and rhythm without murmurs, rubs, or gallops. Respiratory: Clear to auscultation bilaterally. Chest expansion symmetric without accessory muscle use on inspiration or expiration.  Breast: Surgical incision healed well; slight tenderness to palpation along incision extending into upper chest wall with accompanying skin thickening.  No erythema or heat.   GI: Abdomen soft and round. No tenderness to palpation. Bowel sounds normoactive in 4 quadrants.   GU: Deferred.  Musculoskeletal: Muscle  strength 5/5 in all extremities.  Uses cane for assistance. Neuro: No focal deficits.  Psych: Mood and affect normal and appropriate for situation.  Extremities: No edema, cyanosis, or clubbing.  Skin: Warm and dry. No open lesions noted.   LABORATORY DATA:  None for this visit.  DIAGNOSTIC IMAGING:  None for this visit.  ASSESSMENT AND PLAN:   1. History of breast cancer: Stage IA invasive ductal carcinoma with ductal carcinoma in situ, S/P lumpectomy followed by radiation therapy, now on adjuvant endocrine therapy with exesmestane, tolerating well, without clinical symptoms worrisome for disease recurrence.  I believe that Ms. Edgecombe's complaints of tenderness and skin thickening are related to post treatment changes.  She may have a little bit of fluid accumulation along the area.  She already has an appointment to see [name] next week and I will discuss these findings with her following today's appointment.  She does not appear to have focal signs of infection and I have asked her to notify me if her pain worsens or she develops new symptoms (including but not limited to swelling, erythema, or fever)  before her appointment with [name] next week.  Ms. Kamphuis will follow-up with her medical oncologist,  [name], in October 2016 with history and physical exam per surveillance protocol.  She will continue her anti-estrogen therapy with exemestane and she was instructed to make [name] or myself aware if she begins to experience any side effects of the medication and I could see her back in clinic to help manage those side effects, as needed. A comprehensive survivorship care plan and treatment summary was reviewed with the patient today detailing her breast cancer diagnosis, treatment course, potential late/long-term effects of treatment, appropriate follow-up care with recommendations for the future, and patient education resources.  A copy of this summary, along with a letter  will be sent to the patient's primary care provider via in basket message after today's visit.  Ms. Artley is welcome to return to the Survivorship Clinic in the future, as needed; no follow-up will be scheduled at this time.    2. Bone health:  Given Ms. Heatley's age/history of breast cancer and her current treatment regimen including anti-estrogen therapy with exemestane, she is at risk for bone demineralization.  She has orders for bone density testing prior to her upcoming appointment with [name].  I will help facilitate scheduling of that test so that results are available in time for her appointment.  In the meantime, she was encouraged to increase her consumption of foods rich in calcium and vitamin D as well as to increase her weight-bearing activities.  She was given education on specific activities to promote bone health.  3. Cancer screening:  Due to Ms. Waltz's history and her age, she should receive screening for skin cancers, colon cancer, and gynecologic cancers.  The information and recommendations are listed on the patient's comprehensive care plan/treatment summary and were reviewed in detail with the patient.    4. Health maintenance and wellness promotion: Ms. Alderman was encouraged to consume 5-7 servings of fruits and vegetables per day. We reviewed the "Nutrition Rainbow" handout, as well as the handout about "Nutrition for Breast Cancer Survivors."  She was also encouraged to engage in moderate to vigorous exercise for 30 minutes per day most days of the week. We discussed the [employer] fitness program, which is designed for cancer survivors to help them become more physically fit after cancer treatments.  She was instructed to limit her alcohol consumption and continue to abstain from tobacco use. She has had her annual influenza vaccination.   5. Support services/counseling: It is not uncommon for this period of the patient's cancer care trajectory to be one of many emotions  and stressors, especially after the loss of her husband in the past year and  her sister's recent cancer recurrence.  We discussed an opportunity for her to participate in the next session of [facility] ("Finding Your New Normal") support group series designed for patients after they have completed treatment as well as consider counseling through our Patient and Family support programs.  Ms. Montijo has strong support in her faith through her church, and I encouraged her to continue to draw from that..   Ms. Wainwright was encouraged to take advantage of our many other support services programs, support groups, and/or counseling in coping with her new life as a cancer survivor after completing anti-cancer treatment.  She was offered support today through active listening and expressive supportive counseling.  She was given information regarding our available services and encouraged to contact me with any questions or for help enrolling in any of our support group/programs.    A total of 50 minutes of face-to-face time was spent with this patient with greater than 50% of that time in counseling and care-coordination.   Sylvan Cheese, NP  Survivorship Program [facility] [phone]   Note: PRIMARY CARE PROVIDER Lind Covert, [place] [phone]

## 2015-08-18 ENCOUNTER — Other Ambulatory Visit: Payer: Self-pay | Admitting: Nurse Practitioner

## 2015-08-18 DIAGNOSIS — C50511 Malignant neoplasm of lower-outer quadrant of right female breast: Secondary | ICD-10-CM

## 2015-08-18 DIAGNOSIS — E559 Vitamin D deficiency, unspecified: Secondary | ICD-10-CM

## 2015-08-19 ENCOUNTER — Ambulatory Visit: Payer: Medicare Other

## 2015-08-20 ENCOUNTER — Other Ambulatory Visit: Payer: Self-pay | Admitting: Family Medicine

## 2015-08-20 NOTE — Progress Notes (Signed)
   Department of Radiation Oncology  Phone:  [phone] Fax:        [phone]   Name: [name] MRN: [id number]  DOB: 1951/01/03  Date: 08/21/2015  Follow Up Visit Note  Diagnosis: Breast cancer of lower-outer quadrant of right female breast   Staging form: Breast, AJCC 7th Edition     Clinical stage from 02/19/2015: Stage IA (T1b, N0, M0) - Unsigned       Staging comments: Staged at breast conference on 3.23.16      Pathologic stage from 03/24/2015: Stage IA (T1c, N0, cM0) - Signed by Enid Cutter, MD on 03/31/2015       Staging comments: Staged on final lumpectomy specimen by Dr. Donato Heinz.  Summary and Interval since last radiation: 05/07/2015-06/23/2015 Right breast/ 45 Gy at 1.8 Gy per fraction x 25 fractions. Right breast boost/ 16 Gy at 2 Gy per fraction x 8 fractions. RT to her right ear after keloid surgery many years ago in [place]  Interval History: [name] presents today for routine followup. Physical therapist and survivorship NP noticed cording over right upper quadrant and asked that patient be further evaluated. Patient is experiencing some tenderness over this area. She states she had noticed this during treatment, but it went away and has just come back. She denies fever or chills.    Physical Exam:  Filed Vitals:   08/21/15 1006  BP: 125/74  Pulse: 59  Temp: 97.6 F (36.4 C)  Weight: 234 lb 4.8 oz (106.278 kg)   Subcutaneous cording over the upper outer quadrant from the breast under the clavicle and to the axilla. No warmth.  Breast is dark. SLN scar is pink with possible area of infection underneath. Good range of motion. No lymphedema in her right arm.   IMPRESSION: [name] is a 64 y.o. female with Stage IA right breast cancer recovering from radiation therapy.  PLAN: I will schedule a mammogram and ultrasound at the [facility] and she will follow up with me afterwards. I will cancel her physical therapy until we figure out what's going on.  I've placed her on  doxycycline to cover infection.   This document serves as a record of services personally performed by Thea Silversmith, MD. It was created on her behalf by Darcus Austin, a trained medical scribe. The creation of this record is based on the scribe's personal observations and the provider's statements to them. This document has been checked and approved by the attending provider.   Thea Silversmith, MD

## 2015-08-21 ENCOUNTER — Ambulatory Visit
Admission: RE | Admit: 2015-08-21 | Discharge: 2015-08-21 | Disposition: A | Discharge status: Home / Self Care | Payer: Medicare Other | Source: Ambulatory Visit | Attending: Radiation Oncology | Admitting: Radiation Oncology

## 2015-08-21 VITALS — BP 125/74 | HR 59 | Temp 97.6°F | Wt 234.3 lb

## 2015-08-21 DIAGNOSIS — C50511 Malignant neoplasm of lower-outer quadrant of right female breast: Secondary | ICD-10-CM

## 2015-08-21 MED ORDER — DOXYCYCLINE HYCLATE 100 MG PO TABS: 100.0000 mg | ORAL_TABLET | Freq: Two times a day (BID) | ORAL | Status: DC

## 2015-08-21 NOTE — Progress Notes (Signed)
BP 125/74 mmHg  Pulse 59  Temp(Src) 97.6 F (36.4 C)  Wt 234 lb 4.8 oz (106.278 kg)   Patient returning s/p 2 months completion of radiation to right breast.Physical therapist noticed cording over right upper quadrant and asked that patient be further evaluated.Patient is having some tenderness over this area.States she had notice during treatment but it went away and has just come back.

## 2015-08-25 ENCOUNTER — Ambulatory Visit: Payer: Medicare Other | Admitting: Physical Therapy

## 2015-08-25 ENCOUNTER — Other Ambulatory Visit: Payer: Self-pay | Admitting: Family Medicine

## 2015-08-26 ENCOUNTER — Encounter: Payer: Self-pay | Admitting: Podiatry

## 2015-08-26 ENCOUNTER — Ambulatory Visit (INDEPENDENT_AMBULATORY_CARE_PROVIDER_SITE_OTHER): Payer: Medicare Other | Admitting: Podiatry

## 2015-08-26 ENCOUNTER — Other Ambulatory Visit: Payer: Medicare Other

## 2015-08-26 DIAGNOSIS — E1140 Type 2 diabetes mellitus with diabetic neuropathy, unspecified: Secondary | ICD-10-CM

## 2015-08-26 DIAGNOSIS — B351 Tinea unguium: Secondary | ICD-10-CM | POA: Diagnosis not present

## 2015-08-26 DIAGNOSIS — M79673 Pain in unspecified foot: Secondary | ICD-10-CM

## 2015-08-26 DIAGNOSIS — E1142 Type 2 diabetes mellitus with diabetic polyneuropathy: Secondary | ICD-10-CM

## 2015-08-26 NOTE — Progress Notes (Signed)
Patient ID: [name], female   DOB: 1951-08-26, 64 y.o.   MRN: [id number] Complaint:  Visit Type: Patient returns to my office for continued preventative foot care services. Complaint: Patient states" my nails have grown long and thick and become painful to walk and wear shoes" Patient has been diagnosed with DM with no complications. He presents for preventative foot care services. No changes to ROS  Podiatric Exam: Vascular: dorsalis pedis and posterior tibial pulses are palpable bilateral. Capillary return is immediate. Temperature gradient is WNL. Skin turgor WNL  Sensorium: Normal Semmes Weinstein monofilament test. Normal tactile sensation bilaterally. Nail Exam: Pt has thick disfigured discolored nails second and third toenails left foot. Ulcer Exam: There is no evidence of ulcer or pre-ulcerative changes or infection. Orthopedic Exam: Muscle tone and strength are WNL. No limitations in general ROM. No crepitus or effusions noted. Foot type and digits show no abnormalities. Bony prominences are unremarkable. Asymptomatic HAV B/L. Skin: No Porokeratosis. No infection or ulcers  Diagnosis:  Tinea unguium, , pain in left toes  Treatment & Plan Procedures and Treatment: Consent by patient was obtained for treatment procedures. The patient understood the discussion of treatment and procedures well. All questions were answered thoroughly reviewed. Debridement of mycotic and hypertrophic toenails, 1 through 5 bilateral and clearing of subungual debris. No ulceration, no infection noted.  Return Visit-Office Procedure: Patient instructed to return to the office for a follow up visit 3 months for continued evaluation and treatment.

## 2015-08-27 ENCOUNTER — Other Ambulatory Visit: Payer: Self-pay | Admitting: Radiation Oncology

## 2015-08-27 ENCOUNTER — Encounter: Payer: Medicare Other | Admitting: Physical Therapy

## 2015-08-27 ENCOUNTER — Ambulatory Visit
Admission: RE | Admit: 2015-08-27 | Discharge: 2015-08-27 | Disposition: A | Discharge status: Home / Self Care | Payer: Medicare Other | Source: Ambulatory Visit | Attending: Radiation Oncology | Admitting: Radiation Oncology

## 2015-08-27 DIAGNOSIS — C50511 Malignant neoplasm of lower-outer quadrant of right female breast: Secondary | ICD-10-CM

## 2015-08-27 DIAGNOSIS — R928 Other abnormal and inconclusive findings on diagnostic imaging of breast: Secondary | ICD-10-CM | POA: Diagnosis not present

## 2015-08-27 DIAGNOSIS — N6489 Other specified disorders of breast: Secondary | ICD-10-CM | POA: Diagnosis not present

## 2015-08-28 ENCOUNTER — Other Ambulatory Visit: Payer: Self-pay | Admitting: Nurse Practitioner

## 2015-08-28 ENCOUNTER — Ambulatory Visit
Admission: RE | Admit: 2015-08-28 | Discharge: 2015-08-28 | Disposition: A | Discharge status: Home / Self Care | Payer: Medicare Other | Source: Ambulatory Visit | Attending: Radiation Oncology | Admitting: Radiation Oncology

## 2015-08-28 VITALS — BP 158/88 | HR 83 | Temp 98.4°F | Wt 233.5 lb

## 2015-08-28 DIAGNOSIS — C50511 Malignant neoplasm of lower-outer quadrant of right female breast: Secondary | ICD-10-CM

## 2015-08-28 NOTE — Progress Notes (Signed)
Patient to discuss results of mammogram and ultra-sound of pain and cording in upper chest/axilla performed on 08/27/15. BP 158/88 mmHg  Pulse 83  Temp(Src) 98.4 F (36.9 C)  Wt 233 lb 8 oz (105.915 kg)

## 2015-08-28 NOTE — Progress Notes (Signed)
   [name], MD is to call Danton Clap on October 82XH of 2016 (the 1 year anniversary of when her late husband was creamated).  Department of Radiation Oncology  Phone:  [phone] Fax:        [phone]   Name: [name] MRN: [id number]  DOB: Dec 18, 1950  Date: 08/28/2015  Follow Up Visit Note  Diagnosis: Breast cancer of lower-outer quadrant of right female breast   Staging form: Breast, AJCC 7th Edition     Clinical stage from 02/19/2015: Stage IA (T1b, N0, M0) - Unsigned       Staging comments: Staged at breast conference on 3.23.16      Pathologic stage from 03/24/2015: Stage IA (T1c, N0, cM0) - Signed by Enid Cutter, MD on 03/31/2015       Staging comments: Staged on final lumpectomy specimen by Dr. Donato Heinz.  Summary and Interval since last radiation: The patient's radiation treatment dates extended from 05/07/2015 to 07//25/2016. Right breast/ 45 Gy at 1.8 Gy per fraction x 25 fractions. Right breast boost/ 16 Gy at 2 Gy per fraction x 8 fractions. RT to her right ear after keloid surgery many years ago in [place]  Interval History: [name] presents today for routine follow-up appointment with radiation oncology. A mammogram and ultra-sound of the chest/axilla was completed on 08/27/2015 which showed no concerning findings for cancer.  She presents with no major complaints today. Her pain is better in her breast. She has not restarted physical therapy. The anniversary of her husband's death and a sister's upcoming surgery have been stressful for her.    Physical Exam: The patient is alert and oriented x3.There is no significant changes to the status of the paients overall health to be noted at this time. The cords in the upper aspect of the breast are improved.  Filed Vitals:   08/28/15 1604  BP: 158/88  Pulse: 83  Temp: 98.4 F (36.9 C)  Weight: 233 lb 8 oz (105.915 kg)    IMPRESSION: [name] is a 64 y.o. 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   PLAN: She may resume PT.  I asked her to meet with our Education officer, museum. She will follow up with [name] as scheduled and continue her aromasin.    This document serves as a record of services personally performed by Thea Silversmith , MD. It was created on her behalf by Lenn Cal, a trained medical scribe. The creation of this record is based on the scribe's personal observations and the provider's statements to them. This document has been checked and approved by the attending provider.   ------------------------------------------------  Thea Silversmith, MD

## 2015-08-29 ENCOUNTER — Encounter: Payer: Self-pay | Admitting: *Deleted

## 2015-08-29 NOTE — Progress Notes (Signed)
[facility] Work  Clinical Social Work was referred by Pension scheme manager for assessment of psychosocial needs.  Clinical Social Worker met with patient briefly after her appt to offer support and assess for needs. Pt reports her sister was just had surgery and the one year anniversary of her husband's death is quickly approaching. CSW provided supportive listening and reviewed common emotions with grief response. We discussed how anticipating anniversary date can be very difficult. We discussed several options for support and ways to cope. Pt stated understanding and denies other needs. CSW provided contact information in case pt wanted to discuss further.   Clinical Social Work interventions: Grief support and education  Loren Racer, [place] Worker [place]  [place] Phone: [phone] Fax: [phone]

## 2015-09-01 ENCOUNTER — Telehealth: Payer: Self-pay | Admitting: Hematology

## 2015-09-01 NOTE — Telephone Encounter (Signed)
Appointment for bone density test already on schedule. Spoke with patient son [name] to confirm appointmnet. Lab/fu with YF 10/28 remains the same.

## 2015-09-03 ENCOUNTER — Ambulatory Visit
Admission: RE | Admit: 2015-09-03 | Discharge: 2015-09-03 | Disposition: A | Discharge status: Home / Self Care | Payer: Medicare Other | Source: Ambulatory Visit | Attending: Nurse Practitioner | Admitting: Nurse Practitioner

## 2015-09-03 ENCOUNTER — Telehealth: Payer: Self-pay | Admitting: Nurse Practitioner

## 2015-09-03 DIAGNOSIS — M810 Age-related osteoporosis without current pathological fracture: Secondary | ICD-10-CM | POA: Diagnosis not present

## 2015-09-03 DIAGNOSIS — Z780 Asymptomatic menopausal state: Secondary | ICD-10-CM | POA: Diagnosis not present

## 2015-09-03 DIAGNOSIS — C50511 Malignant neoplasm of lower-outer quadrant of right female breast: Secondary | ICD-10-CM

## 2015-09-03 DIAGNOSIS — E559 Vitamin D deficiency, unspecified: Secondary | ICD-10-CM

## 2015-09-03 NOTE — Telephone Encounter (Signed)
Called and spoke with [name] about DEXA results (normal) and reinforced upcoming appointment with [name] on 09/26/2015.  [name] asked about upcoming appointment with [name] on 09/17/2015, which is not in computer.  I will confirm with Rad Onc and ask that they give patient a call to confirm.

## 2015-09-03 NOTE — Telephone Encounter (Signed)
Confirmed with [name] that no appointment is needed at this time.  Called and spoke with [name] to convey that information.  [name] will call and check on [name] on the 19th, but no appointment is needed.  [name] will report any change in her condition prior to that time.Marland Kitchen

## 2015-09-19 ENCOUNTER — Other Ambulatory Visit: Payer: Self-pay | Admitting: Family Medicine

## 2015-09-23 DIAGNOSIS — N200 Calculus of kidney: Secondary | ICD-10-CM | POA: Diagnosis not present

## 2015-09-24 ENCOUNTER — Ambulatory Visit: Payer: Medicare Other | Attending: Radiation Oncology | Admitting: Physical Therapy

## 2015-09-24 ENCOUNTER — Other Ambulatory Visit: Payer: Self-pay | Admitting: Family Medicine

## 2015-09-24 DIAGNOSIS — R29898 Other symptoms and signs involving the musculoskeletal system: Secondary | ICD-10-CM | POA: Insufficient documentation

## 2015-09-24 DIAGNOSIS — M7582 Other shoulder lesions, left shoulder: Secondary | ICD-10-CM | POA: Diagnosis not present

## 2015-09-24 DIAGNOSIS — R531 Weakness: Secondary | ICD-10-CM | POA: Diagnosis not present

## 2015-09-24 DIAGNOSIS — M25612 Stiffness of left shoulder, not elsewhere classified: Secondary | ICD-10-CM

## 2015-09-24 DIAGNOSIS — M25531 Pain in right wrist: Secondary | ICD-10-CM

## 2015-09-24 DIAGNOSIS — M25631 Stiffness of right wrist, not elsewhere classified: Secondary | ICD-10-CM | POA: Diagnosis not present

## 2015-09-24 DIAGNOSIS — Z9181 History of falling: Secondary | ICD-10-CM | POA: Insufficient documentation

## 2015-09-25 ENCOUNTER — Other Ambulatory Visit: Payer: Self-pay | Admitting: *Deleted

## 2015-09-25 DIAGNOSIS — C50511 Malignant neoplasm of lower-outer quadrant of right female breast: Secondary | ICD-10-CM

## 2015-09-25 NOTE — Therapy (Signed)
[place], [place], [address] Phone: [phone]   Fax:  [phone]  Physical Therapy Treatment  Patient Details  Name: [name] MRN: [id number] Date of Birth: [birth year] Referring Provider: Thea Silversmith  Encounter Date: 09/24/2015      PT End of Session - 09/24/15 1538    Visit Number 3   Number of Visits 9   Date for PT Re-Evaluation 10/22/15   PT Start Time 0071   PT Stop Time 1600   PT Time Calculation (min) 45 min   Activity Tolerance Patient limited by pain   Behavior During Therapy [facility] for tasks assessed/performed      Past Medical History  Diagnosis Date  . Hypertension   . Diabetes mellitus   . Depression   . Asthma   . Eczema   . Arthritis   . Cataract   . GERD (gastroesophageal reflux disease)   . History of kidney stones     w/ hx of hydronephrosis - followed by [facility]  . Anemia   . Obesity   . HIV nonspecific serology     2006: indeterminate HIV blood test, seen by ID, felt secondary to cross reacting antibodies with no further workup felt necessary at that time   . Fatty liver     Fatty infiltration of liver noted on 03/2012 CT scan  . CHF, acute 05/03/2012  . Breast cancer of lower-outer quadrant of right female breast 02/13/2015  . Radiation 05/07/15-06/23/15    Right Breast    Past Surgical History  Procedure Laterality Date  . Cystoscopy w/ litholapaxy / ehl    . Cholecystectomy  2003  . Replacement total knee bilateral  2005 &2006  . Joint replacement      bilateral knee replacement  . Vascular surgery Right 03/15/2013    Ultrasound guided sclerotherapy  . Left and right heart catheterization with coronary angiogram N/A 05/02/2012    Procedure: LEFT AND RIGHT HEART CATHETERIZATION WITH CORONARY ANGIOGRAM;  Surgeon: Burnell Blanks, MD;  Location: [facility] CATH LAB;  Service: Cardiovascular;  Laterality: N/A;  . Radioactive seed guided mastectomy  with axillary sentinel lymph node biopsy Right 03/21/2015    Procedure: RIGHT  PARTIAL MASTECTOMY WITH RADIOACTIVE SEED LOCALIZATION RIGHT  AXILLARY SENTINEL LYMPH NODE BIOPSY;  Surgeon: Fanny Skates, MD;  Location: [facility];  Service: General;  Laterality: Right;    There were no vitals filed for this visit.  Visit Diagnosis:  Pain in joint involving forearm, right - Plan: PT plan of care cert/re-cert  Stiffness of joint, forearm, right - Plan: PT plan of care cert/re-cert  Weakness of right arm - Plan: PT plan of care cert/re-cert  Generalized weakness - Plan: PT plan of care cert/re-cert  Risk for falls - Plan: PT plan of care cert/re-cert  Decreased range of motion of left shoulder - Plan: PT plan of care cert/re-cert      Subjective Assessment - 09/24/15 1534    Subjective pt states she has seen [name] and she has been cleard to resume physical therapy.  she still has some pain in her right breast but it is "fading away"  she says she still has to use a cane especailly when she walks along distance.    Pertinent History Has a tremor in both hands, perhaps right > left.  Sprained right wrist bafdy when taking care ofher husband; had a brace that she wore and wonders if she should use  it again. Got the brace when she was in [place] home about a year ago; they diagnosed gout.  Is on a medication for gout.  HTN controlled with meds.  Diagnosed with right breast cancer March 2016 with lumpectomy and SLNB 03/21/15.  Finished radiation 06/23/15. h/o kidney stones; cholecystectomy many years ago; bilat. TKAs about 10 years ago.  h/o bilat. vein stripping about a year ago.  Surgery on both ears to remove cheloids.  CHF, asthma.     Pt reports she has fibromyalgia and has generalized pain too                                                                                                             Patient Stated Goals pain in wrist ease up and to strenghten her  wrist a little more She also wants to walk better.    Currently in Pain? Yes   Pain Score 7    Pain Location Wrist  generalized pain too.    Pain Orientation Right   Pain Descriptors / Indicators Aching   Pain Type Chronic pain   Pain Radiating Towards wrist and hand    Pain Onset More than a month ago   Pain Frequency Constant   Aggravating Factors  usiing it    Pain Relieving Factors sitting and resting it   Effect of Pain on Daily Activities pt has to limit daily activities when it starts to hurt    Multiple Pain Sites Yes   Pain Score 9   Pain Location Hip   Pain Orientation Right   Pain Descriptors / Indicators Aching   Pain Radiating Towards top of hip to buttock    Pain Onset 1 to 4 weeks ago   Aggravating Factors  when she first gets up and tries to walk it out.    Pain Relieving Factors nothing    Effect of Pain on Daily Activities unable to get out and do her walking                   Katina Dung - 09/25/15 0001    Open a tight or new jar Mild difficulty   Do heavy household chores (wash walls, wash floors) Mild difficulty   Carry a shopping bag or briefcase No difficulty   Wash your back No difficulty   Use a knife to cut food Mild difficulty   Recreational activities in which you take some force or impact through your arm, shoulder, or hand (golf, hammering, tennis) Mild difficulty   During the past week, to what extent has your arm, shoulder or hand problem interfered with your normal social activities with family, friends, neighbors, or groups? Slightly   During the past week, to what extent has your arm, shoulder or hand problem limited your work or other regular daily activities Not at all   Arm, shoulder, or hand pain. Moderate   Tingling (pins and needles) in your arm, shoulder, or hand Mild   Difficulty Sleeping Mild difficulty   DASH Score 20.45 %  [facility] Goals - 09/25/15 1744    CC  Long Term Goal  #1   Title Pt. will report at least 60% decrease in right wrist pain.   Time 4   Period Weeks   Status On-going   CC Long Term Goal  #2   Title Pt. will show strength at 5/5 at right wrist, and without pain on manual muscle testing.   Period Weeks   Status On-going   CC Long Term Goal  #3   Title Right wrist flexion to at least 60 degrees.   Baseline 40 on 09/24/2015   Period Weeks   Status On-going   CC Long Term Goal  #4   Title Reduce quick DASH score to less than 20, showing improved function.   Baseline 20.45 on 09/24/2015   Status Partially Met   CC Long Term Goal  #5   Title increase score on BERG to 46 demonstrating decreased risk for falls   Baseline 35   Time 4   Period Weeks   Status New            Plan - 09/25/15 1738    Clinical Impression Statement Pt returns to complete physical therapy as she has been cleared to return by [name].  She wants to work on improving her walking as well as decreasing her wrist pain. She scored a 35 on the BERG which demonstrates a high risk for falls if she walks without an assistive device.  A new treatment diagnosis and goal have been added to this recertification.    Pt will benefit from skilled therapeutic intervention in order to improve on the following deficits Pain;Decreased range of motion;Decreased strength;Impaired UE functional use;Abnormal gait;Difficulty walking;Decreased balance   Clinical Impairments Affecting Rehab Potential radiation therapy, hx of fibromyalgia and chronic wrist  pain   PT Frequency 2x / week   PT Duration 4 weeks   PT Treatment/Interventions Passive range of motion;Therapeutic exercise;Manual techniques;Patient/family education;Iontophoresis 95m/ml Dexamethasone;Electrical Stimulation;ADLs/Self Care Home Management;Balance training;Gait training;Stair training;Therapeutic activities;Neuromuscular re-education;Functional mobility training   PT Next Visit Plan .Begin with right  wrist ROM and instructing patient in HEP for that; trial of soft tissue work to the area; consider iontophoresis as needed; electrical stimulation prn pain.  Progress to right wrist strengthening as ROM and pain improve. add core and LE strength progress to balance work    COncologistwith Plan of Care Patient        Problem List Patient Active Problem List   Diagnosis Date Noted  . Hyperlipidemia 08/13/2015  . Breast cancer of lower-outer quadrant of right female breast ([place] 02/13/2015  . Vitamin D deficiency 02/06/2015  . Acute gouty arthritis 07/26/2014  . Type 2 diabetes mellitus with diabetic foot deformity ([place] 01/01/2014  . Systolic CHF, chronic ([place] 02/26/2013  . OBESITY 02/05/2009  . ASTHMA, PERSISTENT, MILD 02/08/2008  . Depression 05/18/2007  . HYPERTENSION, BENIGN SYSTEMIC 01/26/2007  . NEPHROLITHIASIS 01/26/2007  . DJD (degenerative joint disease), multiple sites 01/26/2007   TDonato Heinz BOwens Shark PT    09/25/2015, 5:49 PM  [place] [address]Phone: [phone]  Fax:  [phone] Name: ACLORIS FLIPPOMRN: [id number] of Birth: 305-18-1952

## 2015-09-26 ENCOUNTER — Other Ambulatory Visit (HOSPITAL_BASED_OUTPATIENT_CLINIC_OR_DEPARTMENT_OTHER): Payer: Medicare Other

## 2015-09-26 ENCOUNTER — Encounter: Payer: Self-pay | Admitting: Hematology

## 2015-09-26 ENCOUNTER — Telehealth: Payer: Self-pay | Admitting: *Deleted

## 2015-09-26 ENCOUNTER — Telehealth: Payer: Self-pay | Admitting: Hematology

## 2015-09-26 ENCOUNTER — Ambulatory Visit (HOSPITAL_BASED_OUTPATIENT_CLINIC_OR_DEPARTMENT_OTHER): Payer: Medicare Other | Admitting: Hematology

## 2015-09-26 VITALS — BP 140/78 | HR 83 | Temp 97.7°F | Resp 18 | Ht 62.0 in | Wt 233.2 lb

## 2015-09-26 DIAGNOSIS — I509 Heart failure, unspecified: Secondary | ICD-10-CM

## 2015-09-26 DIAGNOSIS — C50811 Malignant neoplasm of overlapping sites of right female breast: Secondary | ICD-10-CM | POA: Diagnosis not present

## 2015-09-26 DIAGNOSIS — C50511 Malignant neoplasm of lower-outer quadrant of right female breast: Secondary | ICD-10-CM

## 2015-09-26 DIAGNOSIS — I10 Essential (primary) hypertension: Secondary | ICD-10-CM | POA: Diagnosis not present

## 2015-09-26 DIAGNOSIS — E119 Type 2 diabetes mellitus without complications: Secondary | ICD-10-CM | POA: Diagnosis not present

## 2015-09-26 DIAGNOSIS — M1990 Unspecified osteoarthritis, unspecified site: Secondary | ICD-10-CM

## 2015-09-26 DIAGNOSIS — R740 Nonspecific elevation of levels of transaminase and lactic acid dehydrogenase [LDH]: Secondary | ICD-10-CM

## 2015-09-26 LAB — COMPREHENSIVE METABOLIC PANEL (CC13)
ALK PHOS: 217 U/L — AB (ref 40–150)
ALT: 277 U/L — AB (ref 0–55)
ANION GAP: 10 meq/L (ref 3–11)
AST: 98 U/L — ABNORMAL HIGH (ref 5–34)
Albumin: 3.7 g/dL (ref 3.5–5.0)
BILIRUBIN TOTAL: 0.52 mg/dL (ref 0.20–1.20)
BUN: 38.8 mg/dL — AB (ref 7.0–26.0)
CALCIUM: 9.6 mg/dL (ref 8.4–10.4)
CO2: 25 mEq/L (ref 22–29)
Chloride: 107 mEq/L (ref 98–109)
Creatinine: 1.5 mg/dL — ABNORMAL HIGH (ref 0.6–1.1)
EGFR: 42 mL/min/{1.73_m2} — ABNORMAL LOW (ref >90)
GLUCOSE: 177 mg/dL — AB (ref 70–140)
Potassium: 4.7 mEq/L (ref 3.5–5.1)
Sodium: 142 mEq/L (ref 136–145)
TOTAL PROTEIN: 7.1 g/dL (ref 6.4–8.3)

## 2015-09-26 LAB — CBC WITH DIFFERENTIAL/PLATELET
BASO%: 0.4 % (ref 0.0–2.0)
BASOS ABS: 0 10*3/uL (ref 0.0–0.1)
EOS%: 2.6 % (ref 0.0–7.0)
Eosinophils Absolute: 0.2 10*3/uL (ref 0.0–0.5)
HEMATOCRIT: 33.7 % — AB (ref 34.8–46.6)
HGB: 10.7 g/dL — ABNORMAL LOW (ref 11.6–15.9)
LYMPH%: 35.7 % (ref 14.0–49.7)
MCH: 30.1 pg (ref 25.1–34.0)
MCHC: 31.8 g/dL (ref 31.5–36.0)
MCV: 94.7 fL (ref 79.5–101.0)
MONO#: 0.8 10*3/uL (ref 0.1–0.9)
MONO%: 11.1 % (ref 0.0–14.0)
NEUT%: 50.2 % (ref 38.4–76.8)
NEUTROS ABS: 3.7 10*3/uL (ref 1.5–6.5)
NRBC: 0 % (ref 0–0)
PLATELETS: 183 10*3/uL (ref 145–400)
RBC: 3.56 10*6/uL — ABNORMAL LOW (ref 3.70–5.45)
RDW: 13.1 % (ref 11.2–14.5)
WBC: 7.3 10*3/uL (ref 3.9–10.3)
lymph#: 2.6 10*3/uL (ref 0.9–3.3)

## 2015-09-26 NOTE — Telephone Encounter (Signed)
Clled her.  No fever or vomiting or jaundice.  Mild R hip pain  Told to stop lipitor and come see me in 2 weeks or sooner if any new symptoms or feeliing bad  She agrees

## 2015-09-26 NOTE — Telephone Encounter (Signed)
per pof to sch pt appt-gave pt copy avs-cld tomake mamma appt-pt had mamma on 9/28 & DEXA

## 2015-09-26 NOTE — Telephone Encounter (Signed)
Received a call from [facility], [place] from Dr. Ernestina Penna office stated patient's liver enzymes were elevated.  Patient had lab completed today in Dr. Ernestina Penna office; lab work is in  [employer].  Requesting Dr. Ander Slade to review and follow up with patient for repeat liver function in December.  [name] thinks the elevation is due to patient's Lipitor and advised patient to hold the medication.  Please give office a call with questions at [phone].  Derl Barrow, RN

## 2015-09-26 NOTE — Telephone Encounter (Signed)
Called Dr Erin Hearing office & informed of elevated AST & they are on EPIC & able to pull up labs.  Requested that Dr Erin Hearing view & informed that Dr Burr Medico thinks it may be from [place] had her stop it & req that Dr Erin Hearing recheck liver enzymes in Dec.

## 2015-09-26 NOTE — Progress Notes (Addendum)
[place]  [phone]) [phone] [phone]  Clinic follow up Note   Patient Care Team: Lind Covert, MD as PCP - General (Family Medicine) Harriet Masson, DPM (Podiatry) Gevena Cotton, MD (Ophthalmology) Rana Snare, MD (Urology) Dr. Edwin Cap ([place]) Fanny Skates, MD as Consulting Physician (General Surgery) Thea Silversmith, MD as Consulting Physician (Radiation Oncology) Mauro Kaufmann, RN as Registered Nurse Rockwell Germany, RN as Registered Nurse Holley Bouche, NP as Nurse Practitioner (Nurse Practitioner) Truitt Merle, MD as Consulting Physician (Hematology) Sylvan Cheese, NP as Nurse Practitioner (Nurse Practitioner) 09/26/2015  CHIEF COMPLAINTS/PURPOSE OF CONSULTATION:  Follow up breast cancer  Oncology History     Breast cancer of lower-outer quadrant of right female breast   Staging form: Breast, AJCC 7th Edition     Clinical stage from 02/19/2015: Stage IA (T1b, N0, M0) - Unsigned       Staging comments: Staged at breast conference on 3.23.16      Pathologic stage from 03/24/2015: Stage IA (T1c, N0, cM0) - Signed by Enid Cutter, MD on 03/31/2015       Staging comments: Staged on final lumpectomy specimen by Dr. Donato Heinz.      Breast cancer of lower-outer quadrant of right female breast ([place])   01/23/2015 Mammogram Possible mass in right breast warrants further evaluation.  No findings in the left breast suspicious for malignancy   02/03/2015 Breast US Right breast: 5 x 5 x 5 mm irregular hypoechoic mass with internal color vascularity at 6 o'clock position, 4 cm from the nipple   02/10/2015 Initial Biopsy Right needle core bx LOQ: Invasive ductal carcinoma, grade 1, ER+ (100%), PR+ (60%), HER2/neu negative (ratio 1.13), Ki67 3%; DCIS.     02/12/2015 Breast MRI Biopsy-proven malignancy in the central right breast at posterior depth. No MR findings to suggest multicentric or contralateral malignancy.   02/12/2015 Clinical Stage Stage IA: T1b N0   03/21/2015 Definitive Surgery Right breast lumpectomy / [facility] Dalbert Batman): Invasive ductal carcinoma, grade 1, 1.1 cm, ER+ (100%), PR+ (73%), HER-2 negative (ratio 1.3), Ki67 6%, negative margins / no lymphovascular invasion; DCIS.  One lymph node negative for tumor (0/1).     03/21/2015 Oncotype testing RS 7, low risk. (ROR 5% for 10-year recurrence with Tamoxifen alone).    03/21/2015 Pathologic Stage Stage IA: pT1c pN0   05/07/2015 - 06/23/2015 Radiation Therapy Adjuvant RT completed Pablo Ledger): Right breast 45 Gy over 25 fractions.  Right breast boost 16 Gy over 8 fractions. Total dose: 60 Gy.   06/02/2015 -  Anti-estrogen oral therapy Aromasin 25 mg once daily Burr Medico).  Planned duration of therapy: 5 years.   08/15/2015 Survivorship Survivorship visit completed and copy of survivorship care plan provided to patient.    CURRENT THERAPY: aromasin 18m daily, started on 06/02/2015   HISTORY OF PRESENTING ILLNESS:  AVEE [id number].o. female is here because of newly diagnosed breast cancer.  This was discovered by screening mammogtram. Her last screening mammogram in February 2015 was normal.  The screening mammogram on 01/23/2015 showed a possible mass in the right breast. She underwent diagnostic mammogram and ultrasound on 02/03/2015, which showed a 5 x 5 x 5 mm irregular mass in the right breast 6:00 position.  Your biopsy of the mass showed invasive ductal carcinoma and DCIS.   She has multiple medical problems. She has chronic arthritis, with diffuse join pain, 8/10, she takes tylenol. No chest pain, (+) dyspena on moderate exertion, no nausea, abdominal  bloating or change of her bowel habits. She is able to do most of her ADLs, limited light housework, not physically active, spends quite a bit of time sitting during the daytime.  She was hospitalized for UTI in 9/15 and went to rehab for a month. She has been using crane more regularly after that.    INTERIM HISTORY: She returns for follow-up. She is doing well overall. She is tolerating Aromasin well, no significant hot flash. She has chronic knee and hip pain from arthritis, and her right hip pain seems worse lately. No other new side effects. She started the Lipitor in September by her primary care physician, no other new medications.  MEDICAL HISTORY:  Past Medical History  Diagnosis Date  . Hypertension   . Diabetes mellitus   . Depression   . Asthma   . Eczema   . Arthritis   . Cataract   . GERD (gastroesophageal reflux disease)   . History of kidney stones     w/ hx of hydronephrosis - followed by [facility]  . Anemia   . Obesity   . HIV nonspecific serology     2006: indeterminate HIV blood test, seen by ID, felt secondary to cross reacting antibodies with no further workup felt necessary at that time   . Fatty liver     Fatty infiltration of liver noted on 03/2012 CT scan  . CHF, acute ([place]) 05/03/2012  . Breast cancer of lower-outer quadrant of right female breast ([place]) 02/13/2015  . Radiation 05/07/15-06/23/15    Right Breast   GYN HISTORY  Menarchal: 11 LMP: Several years ago Contraceptive: 1979-1986 HRT: NO G3P2:  SURGICAL HISTORY: Past Surgical History  Procedure Laterality Date  . Cystoscopy w/ litholapaxy / ehl    . Cholecystectomy  2003  . Replacement total knee bilateral  2005 &2006  . Joint replacement      bilateral knee replacement  . Vascular surgery Right 03/15/2013    Ultrasound guided sclerotherapy  . Left and right heart catheterization with coronary angiogram N/A 05/02/2012    Procedure: LEFT AND RIGHT HEART CATHETERIZATION WITH CORONARY ANGIOGRAM;  Surgeon: Burnell Blanks, MD;  Location: [facility] CATH LAB;  Service: Cardiovascular;  Laterality: N/A;  . Radioactive seed guided mastectomy with axillary sentinel lymph node biopsy Right 03/21/2015    Procedure: RIGHT  PARTIAL MASTECTOMY WITH RADIOACTIVE SEED LOCALIZATION RIGHT  AXILLARY  SENTINEL LYMPH NODE BIOPSY;  Surgeon: Fanny Skates, MD;  Location: [place];  Service: General;  Laterality: Right;    SOCIAL HISTORY: History   Social History  . Marital Status: Widowed     Spouse Name: [name]  . Number of Children: 3  . Years of Education: 14   Occupational History  . retired-CNA, housekeeping    Social History Main Topics  . Smoking status: Never Smoker   . Smokeless tobacco: Never Used  . Alcohol Use: No  . Drug Use: No  . Sexual Activity: No   Other Topics Concern  . Not on file   Social History Narrative   Health Care POA:    Emergency Contact: son, Emalea Mix [phone]   End of Life Plan:    Who lives with you:  husband Darrall Dears- Sachiko is his primary care giver   Any pets: [place]   Diet: Pt has a varied diet.  Is currently working on smaller portions sizes and no late night snacking for weight loss.   Exercise: Pt exercises 1x weekly  with church group.   Seatbelts: Pt reports wearing seatbelt when in vehicles.    Hobbies: word searches, church, time with family and friends             FAMILY HISTORY: Family History  Problem Relation Age of Onset  . Diabetes Mother   . Stroke Mother   . Heart disease Father   . Anemia Father   . Cancer Sister 17    nose cancer   . Cancer Cousin 30    ovarian cancer   . Cancer Cousin 53    ovarian cancer     ALLERGIES:  has No Known Allergies.  MEDICATIONS:  Current Outpatient Prescriptions  Medication Sig Dispense Refill  . acetaminophen (TYLENOL) 500 MG tablet Take 1,000 mg by mouth 2 (two) times daily.    Marland Kitchen albuterol (PROVENTIL HFA;VENTOLIN HFA) 108 (90 BASE) MCG/ACT inhaler Inhale 2 puffs into the lungs every 6 (six) hours as needed for wheezing or shortness of breath.    Marland Kitchen aspirin 81 MG tablet Take 81 mg by mouth daily.    Marland Kitchen atorvastatin (LIPITOR) 40 MG tablet Take 1 tablet (40 mg total) by mouth daily. 90 tablet 3  . benazepril (LOTENSIN) 20 MG tablet  take 1 tablet by mouth once daily 90 tablet 3  . colchicine 0.6 MG tablet take 1 tablet by mouth twice a day 60 tablet 3  . exemestane (AROMASIN) 25 MG tablet Take 1 tablet (25 mg total) by mouth daily after breakfast. 30 tablet 5  . FLOVENT HFA 110 MCG/ACT inhaler inhale 2 puffs by mouth twice a day 12 g 6  . furosemide (LASIX) 40 MG tablet TAKE 1 TABLET BY MOUTH ONCE DAILY 90 tablet 1  . gabapentin (NEURONTIN) 100 MG capsule Take 2 capsules (200 mg total) by mouth 3 (three) times daily. 270 capsule 3  . HYDROcodone-acetaminophen (NORCO) 5-325 MG per tablet Take 1-2 tablets by mouth every 6 (six) hours as needed for moderate pain or severe pain. 30 tablet 0  . metoprolol succinate (TOPROL-XL) 25 MG 24 hr tablet Take 1 tablet (25 mg total) by mouth daily. 90 tablet 3  . potassium citrate (UROCIT-K) 10 MEQ (1080 MG) SR tablet TAKE 1 TABLET BY MOUTH 2 TIMES DAILY 60 tablet 6  . ranitidine (ZANTAC) 150 MG tablet TAKE 1 TABLET BY MOUTH AT BEDTIME 30 tablet 11  . spironolactone (ALDACTONE) 25 MG tablet TAKE 1 TABLET BY MOUTH ONCE A DAY 30 tablet 6  . trolamine salicylate (ASPERCREME) 10 % cream Apply 1 application topically as needed for muscle pain.      No current facility-administered medications for this visit.    REVIEW OF SYSTEMS:   Constitutional: Denies fevers, chills or abnormal night sweats Eyes: Denies blurriness of vision, double vision or watery eyes Ears, nose, mouth, throat, and face: Denies mucositis or sore throat Respiratory: Denies cough, dyspnea or wheezes Cardiovascular: Denies palpitation, chest discomfort or lower extremity swelling Gastrointestinal:  Denies nausea, heartburn or change in bowel habits Skin: Denies abnormal skin rashes Lymphatics: Denies new lymphadenopathy or easy bruising Neurological:Denies numbness, tingling or new weaknesses Behavioral/Psych: Mood is stable, no new changes  All other systems were reviewed with the patient and are negative.  PHYSICAL  EXAMINATION: ECOG PERFORMANCE STATUS: 2 - Symptomatic, <50% confined to bed  Filed Vitals:   09/26/15 1027  BP: 140/78  Pulse: 83  Temp: 97.7 F (36.5 C)  Resp: 18   Filed Weights   09/26/15 1027  Weight: 233 lb 3.2  oz (105.779 kg)    GENERAL:alert, no distress and comfortable SKIN: skin color, texture, turgor are normal, no rashes or significant lesions EYES: normal, conjunctiva are pink and non-injected, sclera clear OROPHARYNX:no exudate, no erythema and lips, buccal mucosa, and tongue normal  NECK: supple, thyroid normal size, non-tender, without nodularity LYMPH:  no palpable lymphadenopathy in the cervical, axillary or inguinal LUNGS: clear to auscultation and percussion with normal breathing effort HEART: regular rate & rhythm and no murmurs and no lower extremity edema ABDOMEN:abdomen soft, non-tender and normal bowel sounds Musculoskeletal:no cyanosis of digits and no clubbing  PSYCH: alert & oriented x 3 with fluent speech NEURO: no focal motor/sensory deficits Breasts: Breast inspection showed them to be symmetrical with no nipple discharge. Lumpectomy surgical scar in right breast is healing well, (+) breast deformity in the inferior right breast secondary to surgery. (+) Diffuse skin pigmentation of the entire right breast and right axilla. Surgical wounds are healed well and there are scar tissue under the incision. Palpation of bilateral breasts feels lumpy, but no discrete mass. Palpitation of axilla revealed no obvious mass that I could appreciate.   LABORATORY DATA:  I have reviewed the data as listed  Recent Labs  10/02/14 1019 01/22/15 1128  05/30/15 0950 06/27/15 0926 09/26/15 0938  NA 139 141  < > 141 139 142  K 4.5 4.7  < > 4.6 4.5 4.7  CL 102 104  --   --   --   --   CO2 26 26  < > _0 GLUCOSE 99 114*  < > 152* 158* 177*  BUN 16 32*  < > 27.4* 21.5 38.8*  CREATININE 1.19* 1.38*  < > 1.3* 1.1 1.5*  CALCIUM 9.5 9.5  < > 9.6 9.3 9.6  PROT   --  7.6  < > 7.5 7.7 7.1  ALBUMIN  --  4.2  < > 3.5 3.9 3.7  AST  --  16  < > 17 19 98*  ALT  --  20  < > 17 18 277*  ALKPHOS  --  119*  < > 128 147 217*  BILITOT  --  0.6  < > 0.41 0.49 0.52  < > = values in this interval not displayed.  CBC Latest Ref Rng 09/26/2015 06/27/2015 05/30/2015  WBC 3.9 - 10.3 10e3/uL 7.3 7.3 11.8(H)  Hemoglobin 11.6 - 15.9 g/dL 10.7(L) 11.3(L) 10.6(L)  Hematocrit 34.8 - 46.6 % 33.7(L) 35.4 33.5(L)  Platelets 145 - 400 10e3/uL 183 169 205     Pathology 03/21/2015 Diagnosis 1. Breast, lumpectomy, right - INVASIVE DUCTAL CARCINOMA, SEE COMMENT. - DUCTAL CARCINOMA IN SITU. - NEGATIVE FOR LYMPH VASCULAR INVASION. - PREVIOUS BIOPSY SITE IDENTIFIED. - SEE TUMOR SYNOPTIC TEMPLATE BELOW. 2. Lymph node, sentinel, biopsy, right axillary - ONE LYMPH NODE, NEGATIVE FOR TUMOR (0/1).  1. BREAST, INVASIVE TUMOR, WITH LYMPH NODES PRESENT Specimen, including laterality and lymph node sampling (sentinel, non-sentinel): Right breast with sentinel lymph node sampling. Procedure: Lumpectomy. Histologic type: Grade: I of III Tubule formation: 1 Nuclear pleomorphism: 2 Mitotic:1 Tumor size (gross measurement): 1.1 cm Margins: Invasive, distance to closest margin: 0.9 cm (medial). In-situ, distance to closest margin: Same as invasive. If margin positive, focally or broadly: N/A Lymphovascular invasion: Absent. Ductal carcinoma in situ: Present. Grade: I of III Extensive intraductal component: Absent. Lobular neoplasia: Absent. Tumor focality: Unifocal. Treatment effect: None. If present, treatment effect in breast tissue, lymph nodes or both: N/A Extent of tumor: Skin: N/A Nipple:  N/A Skeletal muscle: N/A 2 of 4 FINAL for ILEA, HILTON ([id number]) Microscopic Comment(continued) Lymph nodes: Examined: 1 Sentinel 0 Non-sentinel 1 Total Lymph nodes with metastasis: 0 Isolated tumor cells (< 0.2 mm): N/A Micrometastasis: (> 0.2 mm and < 2.0 mm):  N/A Macrometastasis: (> 2.0 mm): N/A Extracapsular extension: N/A Breast prognostic profile: Estrogen receptor: Not repeated, previous study demonstrated 100% positivity ([id number]). Progesterone receptor: Not repeated, previous study demonstrated 60% positivity ([id number]). Her 2 neu: Repeated, previous study demonstrated no amplification (1.70) ([id number]). Ki-67: Not repeated, previous study demonstrated 3% proliferation rate ([id number]). Non-neoplastic breast: Previous biopsy site related tissue changes. TNM: pT1c, pN0, pMX  Results: IMMUNOHISTOCHEMICAL AND MORPHOMETRIC ANALYSIS BY THE AUTOMATED CELLULAR IMAGING SYSTEM (ACIS) Estrogen Receptor: 100%, POSITIVE, STRONG STAINING INTENSITY Progesterone Receptor: 73%, POSITIVE, STRONG STAINING INTENSITY Proliferation Marker Ki67: 6%  1. CHROMOGENIC IN-SITU HYBRIDIZATION Results: HER-2/NEU BY CISH - NEGATIVE. RESULT RATIO OF HER2: CEP 17 SIGNALS 1.30 AVERAGE HER2 COPY NUMBER PER CELL 1.95  Oncotype Dx: RS 7, predicts 5% 10-year risk of distant recurrence with tamoxifen alone, low risk   RADIOGRAPHIC STUDIES: I have personally reviewed the radiological images as listed and agreed with the findings in the report.  Mr Breast Bilateral W Wo Contrast 02/13/2015    FINDINGS: Breast composition: Scattered fibroglandular densities.  Background parenchymal enhancement: The multiplanar, multi sequence bilateral breast MR demonstrates moderate background parenchymal enhancement with scattered foci of non mass enhancement bilaterally with benign MR features.  Right breast: A focus of metallic clip artifact is identified in the central right breast at posterior depth denoting site of biopsy-proven disease with mild associated post biopsy sequela. At the site of biopsy, examination demonstrates a small round homogeneously enhancing mass with irregular margins measuring approximately 6 x 6 x 6 mm consistent with a residual disease. No additional  suspicious mass or enhancement to suggest multifocal or multicentric disease.  Left breast: Scattered foci of non mass enhancement with benign MR features. No abnormal mass, suspicious enhancement, or morphologic abnormality to suggest contralateral disease.  Lymph nodes: The axillary and internal mammary lymph node chains are symmetric and unremarkable.  Ancillary findings:  None.   IMPRESSION: 1. Biopsy-proven malignancy in the central right breast at posterior depth. 2. No MR findings to suggest multicentric or contralateral malignancy. 3. No evidence of adenopathy.  RECOMMENDATION: Continued surgical management.  BI-RADS CATEGORY  6: Known biopsy-proven malignancy.   Electronically Signed   By: Andres Shad   On: 02/13/2015 10:44     02/03/2015 US breast  Targeted ultrasound is performed, showing a 5 x 5 x 5 mm irregular hypoechoic mass with internal color vascularity within the right breast 6 o'clock position 4 cm from the nipple. No right axillary lymphadenopathy.  IMPRESSION: Suspicious right breast mass.  01/23/2015  Mammogram  FINDINGS: In the right breast, a possible mass warrants further evaluation with spot compression views and possibly ultrasound. In the left breast, no findings suspicious for malignancy. Images were processed with CAD.  IMPRESSION: Further evaluation is suggested for possible mass in the right breast.  RECOMMENDATION: Diagnostic mammogram and possibly ultrasound of the right breast.  Bone density scan 09/03/2015 ASSESSMENT: The BMD measured at Forearm Radius 33% is 0.925 g/cm2 with a T-score of 0.4. This patient is considered NORMAL according to [facility]) criteria.Lumbar spine was not utilized due to advanced degenerative changes.  ASSESSMENT & PLAN:  64 year old postmenopausal African-American American female, screening detected right breast invasive ductal carcinoma and DCIS.  1. pT1c N0 M0,  stage IA right breast invasive  ductal carcinoma, ER positive PR positive HER-2 negative, Ki 67 6%, and DCIS -I discussed her imaging findings and surgical pathology results with her and her daughters in great details. -She has early-stage breast cancer, high possibility of cured by the complete surgical resection. -Reviewed the possibility of local and distant recurrence after surgery, although it's low, we recommend adjuvant radiation and endocrine therapy to reduce her risk. -Her Oncotype was low risk, no need adjuvant chemotherapy -She has started Aromasin, tolerating well so far, we'll continue, plan for total 5 years. - she has significantly elevated ALT on lab test today, asymptomatic, likely related to Lipitor which she started a month ago. If her liver enzymes does not recover after stopping Lipitor, I'll hold on Aromasin then  -We discussed the surveillance plan, with annual mammogram, self and physician breast exam, I encouraged her to have healthy diet and exercise regularly. -next bilateral diagnostic mammogram in March 2017. She had the right breast mammogram and ultrasound in September 2016 for her breast lump, which showed likely surgical and radiation change.   2. Bone health -I discussed the side effect of osteopenia and osteoporosis from Aromasin - I encouraged her to continue calcium and vitamin D supplement  -Her recent bone density scan was normal.   3. Hypertension, diabetes, arthritis, CHF -She will continue follow-up with her primary care physician  4. Transaminitis -Her liver function was normal in July, and today's lab showed elevated ALT at 277, AST 98, alkaline phosphatase 217. She is asymptomatic. -She had hepatitis C checked in September which was negative -I think this is likely related to medication, probably Lipitor. I told her to stop Lipitor for now and repeat her labs in 1 months with her primary care physician. She has an appointment with [name] in early Dec  -If her next lab and  I'm is still elevated, then I'll stop her Aromasin.  Follow up: -she will repeat her LFTs with her PCP in one month and call me afterwards -Return to clinic in 4 months with lab and mammogram in March 2017   All questions were answered. The patient knows to call the clinic with any problems, questions or concerns. I spent 20 minutes counseling the patient face to face. The total time spent in the appointment was 25 minutes and more than 50% was on counseling.     Truitt Merle, MD 09/26/2015 10:57 AM

## 2015-09-30 ENCOUNTER — Ambulatory Visit: Payer: Medicare Other | Attending: Radiation Oncology

## 2015-09-30 DIAGNOSIS — M25531 Pain in right wrist: Secondary | ICD-10-CM | POA: Diagnosis not present

## 2015-09-30 DIAGNOSIS — M25631 Stiffness of right wrist, not elsewhere classified: Secondary | ICD-10-CM | POA: Insufficient documentation

## 2015-09-30 DIAGNOSIS — R29898 Other symptoms and signs involving the musculoskeletal system: Secondary | ICD-10-CM | POA: Insufficient documentation

## 2015-09-30 DIAGNOSIS — Z9181 History of falling: Secondary | ICD-10-CM | POA: Diagnosis not present

## 2015-09-30 DIAGNOSIS — M25551 Pain in right hip: Secondary | ICD-10-CM | POA: Diagnosis not present

## 2015-09-30 DIAGNOSIS — R531 Weakness: Secondary | ICD-10-CM | POA: Insufficient documentation

## 2015-09-30 NOTE — Therapy (Signed)
[place], [place], [address] Phone: [phone]   Fax:  [phone]  Physical Therapy Treatment  Patient Details  Name: [name] MRN: [id number] Date of Birth: [birth year] Referring Provider: Thea Silversmith  Encounter Date: 09/30/2015      PT End of Session - 09/30/15 1207    Visit Number 4   Number of Visits 9   Date for PT Re-Evaluation 10/22/15   PT Start Time 0940   PT Stop Time 1036   PT Time Calculation (min) 56 min   Activity Tolerance Patient tolerated treatment well   Behavior During Therapy [facility] for tasks assessed/performed      Past Medical History  Diagnosis Date  . Hypertension   . Diabetes mellitus   . Depression   . Asthma   . Eczema   . Arthritis   . Cataract   . GERD (gastroesophageal reflux disease)   . History of kidney stones     w/ hx of hydronephrosis - followed by [facility]  . Anemia   . Obesity   . HIV nonspecific serology     2006: indeterminate HIV blood test, seen by ID, felt secondary to cross reacting antibodies with no further workup felt necessary at that time   . Fatty liver     Fatty infiltration of liver noted on 03/2012 CT scan  . CHF, acute ([place]) 05/03/2012  . Breast cancer of lower-outer quadrant of right female breast ([place]) 02/13/2015  . Radiation 05/07/15-06/23/15    Right Breast    Past Surgical History  Procedure Laterality Date  . Cystoscopy w/ litholapaxy / ehl    . Cholecystectomy  2003  . Replacement total knee bilateral  2005 &2006  . Joint replacement      bilateral knee replacement  . Vascular surgery Right 03/15/2013    Ultrasound guided sclerotherapy  . Left and right heart catheterization with coronary angiogram N/A 05/02/2012    Procedure: LEFT AND RIGHT HEART CATHETERIZATION WITH CORONARY ANGIOGRAM;  Surgeon: Burnell Blanks, MD;  Location: [facility] CATH LAB;  Service: Cardiovascular;  Laterality: N/A;  . Radioactive seed  guided mastectomy with axillary sentinel lymph node biopsy Right 03/21/2015    Procedure: RIGHT  PARTIAL MASTECTOMY WITH RADIOACTIVE SEED LOCALIZATION RIGHT  AXILLARY SENTINEL LYMPH NODE BIOPSY;  Surgeon: Fanny Skates, MD;  Location: [place];  Service: General;  Laterality: Right;    There were no vitals filed for this visit.  Visit Diagnosis:  Pain in joint involving forearm, right  Stiffness of joint, forearm, right  Weakness of right arm  Generalized weakness      Subjective Assessment - 09/30/15 0944    Subjective My Rt wrist is really bothering me today, think it's because it's cloudy. And my feet always hurt.   Currently in Pain? Yes   Pain Score 7    Pain Location Wrist   Pain Orientation Right   Pain Descriptors / Indicators Throbbing  Intermittent   Pain Type Chronic pain   Pain Frequency Intermittent   Aggravating Factors  sometimes using it, but it really just comes and goes   Pain Relieving Factors so far nothing   Multiple Pain Sites Yes   Pain Score 9   Pain Location Foot   Pain Orientation Right;Left   Pain Descriptors / Indicators Other (Comment);Throbbing  Feels like I'm walking on something, like cotton under my feet   Pain Onset More than a month ago   Pain  Frequency Constant   Aggravating Factors  just always hurt, but walking makes them worse   Pain Relieving Factors nothing                         [facility] Adult PT Treatment/Exercise - 09/30/15 0001    Electrical Stimulation   Electrical Stimulation Location Rt wrist   Electrical Stimulation Action Premod only used 1 channel   Electrical Stimulation Parameters 80-150 Hz x 15 minutes   Electrical Stimulation Goals Pain   Manual Therapy   Joint Mobilization gentle Rt wrist distraction and carpal bone glides    Soft tissue mobilization To Rt wrist with pt sitting in chair and UE resting on table working on all aspects of the wrist and palmar and dorsal aspects of hand    Passive ROM To Rt wrist into flexion, extension and supination                PT Education - 09/30/15 1207    Education provided Yes   Education Details Wrist flexibility   Person(s) Educated Patient   Methods Explanation;Demonstration;Handout   Comprehension Verbalized understanding;Returned demonstration                [facility] Goals - 09/25/15 1744    CC Long Term Goal  #1   Title Pt. will report at least 60% decrease in right wrist pain.   Time 4   Period Weeks   Status On-going   CC Long Term Goal  #2   Title Pt. will show strength at 5/5 at right wrist, and without pain on manual muscle testing.   Period Weeks   Status On-going   CC Long Term Goal  #3   Title Right wrist flexion to at least 60 degrees.   Baseline 40 on 09/24/2015   Period Weeks   Status On-going   CC Long Term Goal  #4   Title Reduce quick DASH score to less than 20, showing improved function.   Baseline 20.45 on 09/24/2015   Status Partially Met   CC Long Term Goal  #5   Title increase score on BERG to 46 demonstrating decreased risk for falls   Baseline 35   Time 4   Period Weeks   Status New            Plan - 09/30/15 1208    Clinical Impression Statement Pt tolerated manual therapy well today with only mild discomfort felt with soem PROM but did not prevent treatment. Pt also did well with electrical stimulation and reported feeling better after session today.    Pt will benefit from skilled therapeutic intervention in order to improve on the following deficits Pain;Decreased range of motion;Decreased strength;Impaired UE functional use;Abnormal gait;Difficulty walking;Decreased balance   Rehab Potential Good   Clinical Impairments Affecting Rehab Potential radiation therapy, hx of fibromyalgia and chronic wrist  pain   PT Frequency 2x / week   PT Duration 4 weeks   PT Treatment/Interventions Passive range of motion;Therapeutic exercise;Manual  techniques;Patient/family education;Iontophoresis 15m/ml Dexamethasone;Electrical Stimulation;ADLs/Self Care Home Management;Balance training;Gait training;Stair training;Therapeutic activities;Neuromuscular re-education;Functional mobility training   PT Next Visit Plan Cont with right wrist ROM and instructing patient in HEP for that; cont soft tissue work to the area; consider iontophoresis as needed; cont electrical stimulation prn pain.  Progress to right wrist strengthening as ROM and pain improve. add core and LE strength progress to balance work    COncologistwith Plan  of Care Patient        Problem List Patient Active Problem List   Diagnosis Date Noted  . Hyperlipidemia 08/13/2015  . Breast cancer of lower-outer quadrant of right female breast ([place]) 02/13/2015  . Vitamin D deficiency 02/06/2015  . Acute gouty arthritis 07/26/2014  . Type 2 diabetes mellitus with diabetic foot deformity ([place]) 01/01/2014  . Systolic CHF, chronic ([place]) 02/26/2013  . OBESITY 02/05/2009  . ASTHMA, PERSISTENT, MILD 02/08/2008  . Depression 05/18/2007  . HYPERTENSION, BENIGN SYSTEMIC 01/26/2007  . NEPHROLITHIASIS 01/26/2007  . DJD (degenerative joint disease), multiple sites 01/26/2007    Otelia Limes, PTA 09/30/2015, 12:11 PM  [place], [place], [address] Phone: [phone]   Fax:  [phone]  Name: [name] MRN: [id number] Date of Birth: [birth year]

## 2015-09-30 NOTE — Patient Instructions (Signed)
Place Rt hand on wall with fingertips facing upward to start and keep elbow straight and face away from wall. Hold for 5 seconds and do 5 reps, 2-3 times a day. Progress by turning fingers away from you as tolerated.   Also do pray stretch by keeping shoulders relaxed and bring elbows out to sides, hold 10 seconds, 5 reps. 4-5 times a day.

## 2015-10-02 ENCOUNTER — Ambulatory Visit: Payer: Medicare Other

## 2015-10-02 ENCOUNTER — Other Ambulatory Visit: Payer: Self-pay | Admitting: Family Medicine

## 2015-10-02 DIAGNOSIS — R531 Weakness: Secondary | ICD-10-CM | POA: Diagnosis not present

## 2015-10-02 DIAGNOSIS — M25531 Pain in right wrist: Secondary | ICD-10-CM

## 2015-10-02 DIAGNOSIS — R29898 Other symptoms and signs involving the musculoskeletal system: Secondary | ICD-10-CM

## 2015-10-02 DIAGNOSIS — Z9181 History of falling: Secondary | ICD-10-CM | POA: Diagnosis not present

## 2015-10-02 DIAGNOSIS — M25551 Pain in right hip: Secondary | ICD-10-CM | POA: Diagnosis not present

## 2015-10-02 DIAGNOSIS — M25631 Stiffness of right wrist, not elsewhere classified: Secondary | ICD-10-CM | POA: Diagnosis not present

## 2015-10-02 NOTE — Therapy (Signed)
[place], [place], [address] Phone: [phone]   Fax:  [phone]  Physical Therapy Treatment  Patient Details  Name: [name] MRN: [id number] Date of Birth: 09/17/1951 Referring Provider: Thea Silversmith  Encounter Date: 10/02/2015      PT End of Session - 10/02/15 1111    Visit Number 5   Number of Visits 9   Date for PT Re-Evaluation 10/22/15   PT Start Time 1013   PT Stop Time 1119   PT Time Calculation (min) 66 min   Activity Tolerance Patient tolerated treatment well   Behavior During Therapy [facility] for tasks assessed/performed      Past Medical History  Diagnosis Date  . Hypertension   . Diabetes mellitus   . Depression   . Asthma   . Eczema   . Arthritis   . Cataract   . GERD (gastroesophageal reflux disease)   . History of kidney stones     w/ hx of hydronephrosis - followed by [facility]  . Anemia   . Obesity   . HIV nonspecific serology     2006: indeterminate HIV blood test, seen by ID, felt secondary to cross reacting antibodies with no further workup felt necessary at that time   . Fatty liver     Fatty infiltration of liver noted on 03/2012 CT scan  . CHF, acute ([place]) 05/03/2012  . Breast cancer of lower-outer quadrant of right female breast ([place]) 02/13/2015  . Radiation 05/07/15-06/23/15    Right Breast    Past Surgical History  Procedure Laterality Date  . Cystoscopy w/ litholapaxy / ehl    . Cholecystectomy  2003  . Replacement total knee bilateral  2005 &2006  . Joint replacement      bilateral knee replacement  . Vascular surgery Right 03/15/2013    Ultrasound guided sclerotherapy  . Left and right heart catheterization with coronary angiogram N/A 05/02/2012    Procedure: LEFT AND RIGHT HEART CATHETERIZATION WITH CORONARY ANGIOGRAM;  Surgeon: Burnell Blanks, MD;  Location: [place] CATH LAB;  Service: Cardiovascular;  Laterality: N/A;  . Radioactive seed  guided mastectomy with axillary sentinel lymph node biopsy Right 03/21/2015    Procedure: RIGHT  PARTIAL MASTECTOMY WITH RADIOACTIVE SEED LOCALIZATION RIGHT  AXILLARY SENTINEL LYMPH NODE BIOPSY;  Surgeon: Fanny Skates, MD;  Location: [place];  Service: General;  Laterality: Right;    There were no vitals filed for this visit.  Visit Diagnosis:  Pain in joint involving forearm, right  Stiffness of joint, forearm, right  Weakness of right arm  Generalized weakness      Subjective Assessment - 10/02/15 1019    Subjective My Rt wrist felt good after last session until about 6 that night and I did some of the stretches you gave me and those helped too. It started bothering me again that night and it's hurting now.    Pertinent History Has a tremor in both hands, perhaps right > left.  Sprained right wrist bafdy when taking care ofher husband; had a brace that she wore and wonders if she should use it again. Got the brace when she was in [place] home about a year ago; they diagnosed gout.  Is on a medication for gout.  HTN controlled with meds.  Diagnosed with right breast cancer March 2016 with lumpectomy and SLNB 03/21/15.  Finished radiation 06/23/15. h/o kidney stones; cholecystectomy many years ago; bilat. TKAs about 10 years ago.  h/o bilat. vein stripping about a year ago.  Surgery on both ears to remove cheloids.  CHF, asthma.     Pt reports she has fibromyalgia and has generalized pain too                                                                                                             Patient Stated Goals pain in wrist ease up and to strenghten her wrist a little more She also wants to walk better.    Currently in Pain? Yes   Pain Score 6    Pain Location Wrist   Pain Orientation Right   Pain Descriptors / Indicators Throbbing   Pain Type Chronic pain   Pain Onset More than a month ago   Pain Frequency Intermittent   Aggravating Factors  overusing  it    Pain Relieving Factors Everything we did last session helped                         [facility] Adult PT Treatment/Exercise - 10/02/15 0001    Electrical Stimulation   Electrical Stimulation Location Rt wrist   Electrical Stimulation Action Premod using only 1 channel   Electrical Stimulation Parameters 80-150Hz  x 17 minutes   Electrical Stimulation Goals Pain   Iontophoresis   Type of Iontophoresis Dexamethasone   Location Rt posterior medial wrist    Dose 1 ml 40 mA/min   Time 6 hr wear   Manual Therapy   Joint Mobilization gentle Rt wrist distraction and carpal bone glides    Soft tissue mobilization To Rt wrist with pt sitting in chair and UE resting on pillow on mat table working on all aspects of the wrist and palmar and dorsal aspects of hand   Passive ROM To Rt wrist into flexion, extension, radial deviation and supination                PT Education - 10/02/15 1110    Education provided Yes   Education Details Instructed pt in precautions of iontophoresis wear and to doff after 6 hrs which today will be ~1730   Person(s) Educated Patient   Methods Explanation;Demonstration   Comprehension Verbalized understanding                [place] - 10/02/15 1123    CC Long Term Goal  #1   Title Pt. will report at least 60% decrease in right wrist pain.  Pt had relief from symptoms after last session > 6 hours.   Status On-going   CC Long Term Goal  #2   Title Pt. will show strength at 5/5 at right wrist, and without pain on manual muscle testing.   Status On-going   CC Long Term Goal  #3   Title Right wrist flexion to at least 60 degrees.   Status On-going   CC Long Term Goal  #4   Status On-going   CC Long Term Goal  #5   Title increase score on  BERG to 46 demonstrating decreased risk for falls   Status On-going            Plan - 10/02/15 1115    Clinical Impression Statement Pt came in with less pain today than  last session and reported her relief felt until that evening. So continued with same treatment today incorporating more PROM and joint distraction throughout soft tissue work and pt continued to tolerate this well. Also began iontophoresis treatment today to help further reduce her symptoms.    Pt will benefit from skilled therapeutic intervention in order to improve on the following deficits Pain;Decreased range of motion;Decreased strength;Impaired UE functional use;Abnormal gait;Difficulty walking;Decreased balance   Rehab Potential Good   Clinical Impairments Affecting Rehab Potential radiation therapy, hx of fibromyalgia and chronic wrist  pain   PT Frequency 2x / week   PT Duration 4 weeks   PT Treatment/Interventions Passive range of motion;Therapeutic exercise;Manual techniques;Patient/family education;Iontophoresis 4mg /ml Dexamethasone;Electrical Stimulation;ADLs/Self Care Home Management;Balance training;Gait training;Stair training;Therapeutic activities;Neuromuscular re-education;Functional mobility training   PT Next Visit Plan Measure Rt wrist flexion. Cont with right wrist ROM and instructing patient in HEP for that; cont soft tissue work/manual therapy to the area; assess and cont, if tolerated well, iontophoresis; cont electrical stimulation. Progress to right wrist strengthening as ROM and pain improve. add core and LE strength progress to balance work    Oncologist with Plan of Care Patient        Problem List Patient Active Problem List   Diagnosis Date Noted  . Hyperlipidemia 08/13/2015  . Breast cancer of lower-outer quadrant of right female breast ([place]) 02/13/2015  . Vitamin D deficiency 02/06/2015  . Acute gouty arthritis 07/26/2014  . Type 2 diabetes mellitus with diabetic foot deformity ([place]) 01/01/2014  . Systolic CHF, chronic ([place]) 02/26/2013  . OBESITY 02/05/2009  . ASTHMA, PERSISTENT, MILD 02/08/2008  . Depression 05/18/2007  . HYPERTENSION, BENIGN  SYSTEMIC 01/26/2007  . NEPHROLITHIASIS 01/26/2007  . DJD (degenerative joint disease), multiple sites 01/26/2007    Otelia Limes, PTA 10/02/2015, 11:28 AM  [place], [place], [address] Phone: [phone]   Fax:  [phone]  Name: [name] MRN: [id number] Date of Birth: [birth year]

## 2015-10-07 ENCOUNTER — Ambulatory Visit: Payer: Medicare Other

## 2015-10-07 ENCOUNTER — Telehealth: Payer: Self-pay | Admitting: Family Medicine

## 2015-10-07 DIAGNOSIS — Z9181 History of falling: Secondary | ICD-10-CM | POA: Diagnosis not present

## 2015-10-07 DIAGNOSIS — M25531 Pain in right wrist: Secondary | ICD-10-CM

## 2015-10-07 DIAGNOSIS — R29898 Other symptoms and signs involving the musculoskeletal system: Secondary | ICD-10-CM | POA: Diagnosis not present

## 2015-10-07 DIAGNOSIS — M25631 Stiffness of right wrist, not elsewhere classified: Secondary | ICD-10-CM

## 2015-10-07 DIAGNOSIS — R531 Weakness: Secondary | ICD-10-CM | POA: Diagnosis not present

## 2015-10-07 DIAGNOSIS — M25551 Pain in right hip: Secondary | ICD-10-CM | POA: Diagnosis not present

## 2015-10-07 NOTE — Telephone Encounter (Signed)
Pt is still having pains in her side. Has been going on for about 3 weeks. Thought she could bear it but she cant any more Would like to talk to dr Erin Hearing about this

## 2015-10-07 NOTE — Therapy (Signed)
[place], [place], [address] Phone: [phone]   Fax:  [phone]  Physical Therapy Treatment  Patient Details  Name: [name] MRN: [id number] Date of Birth: October 31, 1951 Referring Provider: Thea Silversmith  Encounter Date: 10/07/2015      PT End of Session - 10/07/15 1056    Visit Number 6   Number of Visits 9   Date for PT Re-Evaluation 10/22/15   PT Start Time 1021   PT Stop Time 1109   PT Time Calculation (min) 48 min   Activity Tolerance Patient tolerated treatment well;Other (comment)  Pt wanted to sit for treamtent but constantly readjusting due Rt hip pain.   Behavior During Therapy [facility] for tasks assessed/performed      Past Medical History  Diagnosis Date  . Hypertension   . Diabetes mellitus   . Depression   . Asthma   . Eczema   . Arthritis   . Cataract   . GERD (gastroesophageal reflux disease)   . History of kidney stones     w/ hx of hydronephrosis - followed by [facility]  . Anemia   . Obesity   . HIV nonspecific serology     2006: indeterminate HIV blood test, seen by ID, felt secondary to cross reacting antibodies with no further workup felt necessary at that time   . Fatty liver     Fatty infiltration of liver noted on 03/2012 CT scan  . CHF, acute ([place]) 05/03/2012  . Breast cancer of lower-outer quadrant of right female breast ([place]) 02/13/2015  . Radiation 05/07/15-06/23/15    Right Breast    Past Surgical History  Procedure Laterality Date  . Cystoscopy w/ litholapaxy / ehl    . Cholecystectomy  2003  . Replacement total knee bilateral  2005 &2006  . Joint replacement      bilateral knee replacement  . Vascular surgery Right 03/15/2013    Ultrasound guided sclerotherapy  . Left and right heart catheterization with coronary angiogram N/A 05/02/2012    Procedure: LEFT AND RIGHT HEART CATHETERIZATION WITH CORONARY ANGIOGRAM;  Surgeon: Burnell Blanks,  MD;  Location: [facility] CATH LAB;  Service: Cardiovascular;  Laterality: N/A;  . Radioactive seed guided mastectomy with axillary sentinel lymph node biopsy Right 03/21/2015    Procedure: RIGHT  PARTIAL MASTECTOMY WITH RADIOACTIVE SEED LOCALIZATION RIGHT  AXILLARY SENTINEL LYMPH NODE BIOPSY;  Surgeon: Fanny Skates, MD;  Location: [place];  Service: General;  Laterality: Right;    There were no vitals filed for this visit.  Visit Diagnosis:  Stiffness of joint, forearm, right  Pain in joint involving forearm, right  Weakness of right arm      Subjective Assessment - 10/07/15 1028    Subjective My Rt hip is really bothering me, has been for the last 3 weeks. I have an appt with my PCP Dr. Josiah Lobo tomorrow. I'm trying to avoid going to the ER tonight, It's hurting that bad, but I didn't want to miss my appt, I don't like doing that. My Rt wrist is just off and on with pain.    Pertinent History Has a tremor in both hands, perhaps right > left.  Sprained right wrist bafdy when taking care ofher husband; had a brace that she wore and wonders if she should use it again. Got the brace when she was in [place] home about a year ago; they diagnosed gout.  Is on a medication for gout.  HTN controlled with meds.  Diagnosed with right breast cancer March 2016 with lumpectomy and SLNB 03/21/15.  Finished radiation 06/23/15. h/o kidney stones; cholecystectomy many years ago; bilat. TKAs about 10 years ago.  h/o bilat. vein stripping about a year ago.  Surgery on both ears to remove cheloids.  CHF, asthma.     Pt reports she has fibromyalgia and has generalized pain too                                                                                                             Patient Stated Goals pain in wrist ease up and to strenghten her wrist a little more She also wants to walk better.    Currently in Pain? Yes   Pain Score 7    Pain Location Wrist   Pain Orientation Right   Pain  Descriptors / Indicators Throbbing   Pain Type Chronic pain   Pain Onset More than a month ago   Pain Frequency Intermittent   Aggravating Factors  leaning on it   Pain Relieving Factors therapy has been helping for about 4-5 hours after sessions   Pain Score 10   Pain Location Hip   Pain Orientation Right   Pain Descriptors / Indicators Sharp;Shooting;Stabbing   Pain Type Acute pain   Pain Onset 1 to 4 weeks ago  3 weeks ago per pt   Pain Frequency Constant   Aggravating Factors  nothing, always hurts   Pain Relieving Factors nothing is helping right now, not even my pain meds   Effect of Pain on Daily Activities very limited with everyhting right now, going to see her doctor tomorrow                         [facility] Adult PT Treatment/Exercise - 10/07/15 0001    Electrical Stimulation   Electrical Stimulation Location Rt wrist   Electrical Stimulation Action Premod using 1 channel   Electrical Stimulation Parameters 80-150Hz  x 10 mins   Electrical Stimulation Goals Pain   Iontophoresis   Type of Iontophoresis Dexamethasone   Location Rt posterior medial wrist    Dose 1 ml 40 mA/min   Time 6 hr wear   Manual Therapy   Joint Mobilization gentle Rt wrist distraction and carpal bone glides    Soft tissue mobilization To Rt wrist with pt sitting in chair and UE resting on pillow on mat table working on all aspects of the wrist and palmar and dorsal aspects of hand   Passive ROM To Rt wrist into flexion, extension, radial deviation and supination                        [facility] Goals - 10/02/15 1123    CC Long Term Goal  #1   Title Pt. will report at least 60% decrease in right wrist pain.  Pt had relief from symptoms after last session > 6 hours.   Status On-going   CC Long Term  Goal  #2   Title Pt. will show strength at 5/5 at right wrist, and without pain on manual muscle testing.   Status On-going   CC Long Term Goal  #3   Title  Right wrist flexion to at least 60 degrees.   Status On-going   CC Long Term Goal  #4   Status On-going   CC Long Term Goal  #5   Title increase score on BERG to 46 demonstrating decreased risk for falls   Status On-going            Plan - 10/07/15 1057    Clinical Impression Statement Pts Rt hip really bothering her but she wanted to have therapy anyways reporting she would wither sit in pain here or at home. Tolerated treatment well for Rt wrist and pt reported no problem with ionto patch last session and willing to try again. so continued this today.   Pt will benefit from skilled therapeutic intervention in order to improve on the following deficits Pain;Decreased range of motion;Decreased strength;Impaired UE functional use;Abnormal gait;Difficulty walking;Decreased balance   Rehab Potential Good   Clinical Impairments Affecting Rehab Potential radiation therapy, hx of fibromyalgia and chronic wrist  pain   PT Frequency 2x / week   PT Duration 4 weeks   PT Treatment/Interventions Passive range of motion;Therapeutic exercise;Manual techniques;Patient/family education;Iontophoresis 4mg /ml Dexamethasone;Electrical Stimulation;ADLs/Self Care Home Management;Balance training;Gait training;Stair training;Therapeutic activities;Neuromuscular re-education;Functional mobility training   PT Next Visit Plan Measure Rt wrist flexion. Cont with right wrist ROM and instructing patient in HEP for that; cont soft tissue work/manual therapy to the area; assess and cont iontophoresis; cont electrical stimulation. Progress to right wrist strengthening as ROM and pain improve. add core and LE strength progress to balance work    Oncologist with Plan of Care Patient        Problem List Patient Active Problem List   Diagnosis Date Noted  . Hyperlipidemia 08/13/2015  . Breast cancer of lower-outer quadrant of right female breast ([place]) 02/13/2015  . Vitamin D deficiency 02/06/2015  . Acute  gouty arthritis 07/26/2014  . Type 2 diabetes mellitus with diabetic foot deformity ([place]) 01/01/2014  . Systolic CHF, chronic ([place]) 02/26/2013  . OBESITY 02/05/2009  . ASTHMA, PERSISTENT, MILD 02/08/2008  . Depression 05/18/2007  . HYPERTENSION, BENIGN SYSTEMIC 01/26/2007  . NEPHROLITHIASIS 01/26/2007  . DJD (degenerative joint disease), multiple sites 01/26/2007    Otelia Limes, PTA 10/07/2015, 11:09 AM  [place], [place], [address] Phone: [phone]   Fax:  [phone]  Name: [name] MRN: [id number] Date of Birth: 1951/10/03

## 2015-10-07 NOTE — Telephone Encounter (Signed)
Patient is scheduled tomorrow to see PCP.  Will forward to him to see if patient needs to do something regarding pain in the meantime. Liyanna Cartwright,CMA

## 2015-10-07 NOTE — Telephone Encounter (Signed)
Pain better.  No nausea vomiting fever or dysuria Will see me in AM Will call if worsening

## 2015-10-08 ENCOUNTER — Encounter: Payer: Self-pay | Admitting: Family Medicine

## 2015-10-08 ENCOUNTER — Ambulatory Visit (INDEPENDENT_AMBULATORY_CARE_PROVIDER_SITE_OTHER): Payer: Medicare Other | Admitting: Family Medicine

## 2015-10-08 VITALS — BP 145/77 | HR 76 | Temp 97.7°F | Ht 62.0 in | Wt 230.3 lb

## 2015-10-08 DIAGNOSIS — M21969 Unspecified acquired deformity of unspecified lower leg: Secondary | ICD-10-CM | POA: Diagnosis not present

## 2015-10-08 DIAGNOSIS — E1169 Type 2 diabetes mellitus with other specified complication: Secondary | ICD-10-CM

## 2015-10-08 DIAGNOSIS — M25551 Pain in right hip: Secondary | ICD-10-CM

## 2015-10-08 LAB — POCT GLYCOSYLATED HEMOGLOBIN (HGB A1C): HEMOGLOBIN A1C: 7.8

## 2015-10-08 MED ORDER — HYDROCODONE-ACETAMINOPHEN 5-325 MG PO TABS: 1.0000 | ORAL_TABLET | Freq: Four times a day (QID) | ORAL | Status: DC | PRN

## 2015-10-08 MED ORDER — GABAPENTIN 100 MG PO CAPS: 300.0000 mg | ORAL_CAPSULE | Freq: Three times a day (TID) | ORAL | Status: DC

## 2015-10-08 NOTE — Patient Instructions (Signed)
Good to see you today!  Thanks for coming in.  Take the gabapentin 3 tabs three times daily   Take the hydrocodone 1 tablet up to three times daily   Take one tylenol three times daily   If the pain is a lot worse or if you have weakness in your leg or trouble controlling your bowel bladder call us right away   Come back in 2 weeks to discuss pain and diabetes

## 2015-10-08 NOTE — Progress Notes (Addendum)
Patient ID: CAMILE ESTERS, female   DOB: 21-Jan-1951, 64 y.o.   MRN: [id number]   Subjective: CC:  Right hip pain HPI: This history was provided by the patient.  Patient's PMH significant for HTN, CHF, asthma, breast cancer, DM type 2, gout, degenerative disc disease and hyperlipidemia.    Patient is a 64 y.o. female presenting to clinic today for right hip pain radiating to right buttock x3 weeks.  Patient states no particular position makes the pain better, but standing, walking, sitting for prolonged periods and laying flat, makes pain worse.  Patient denies pain in lumbar spine, numbness/tingling in low back, right hip or leg.  Patient denies N/V/D and urinary or fecal incontinence.  Patient denies trauma to back, right hip or leg.  Patient has had a similar episode in the past, approximately one year ago, but states this episode is worse.  For that episode, she was treated with Vicodin, which she states managed her pain.  Patient states she has been treating current pain with arthritis strength acetaminophen every 8 hours with little relief.    Concerns today include:  1. Right hip pain  Social History Reviewed: Never smoker. FamHx and MedHx updated.  Please see EMR. Health Maintenance: No addressed this visit due to acuity of patient's pain.  She will follow-up in clinic in 2 weeks to reassess pain and we will address A1C at that time.  ROS: Per HPI  Objective: Office vital signs reviewed. BP 145/77 mmHg  Pulse 76  Temp(Src) 97.7 F (36.5 C) (Oral)  Ht 5\' 2"  (1.575 m)  Wt 230 lb 5 oz (104.469 kg)  BMI 42.11 kg/m2  Physical Examination:  General: African American woman who appears stated age.  Awake, alert, well-nourished.  Patient seems to be uncomfortable with right hip pain and shifts restlessly in chair Cardio: RRR, S1S2 heard, no murmurs appreciated, no edema, cyanosis or clubbing; +2 radial pulses bilaterally Pulm: CTAB, no wheezes, rhonchi or rales MSK: Uses cane to  ambulate at baseline.  Limping gait.  Pain increased with palpation of right low back and right posterior hip and buttock.  No crepitus or increase in pain with palpation of right greater trochanter or right femur.  No sensation of warmth or heat over right trochanter.  Full ROM right hip.  No right knee pain or crepitus (bilateral knee replacement).  No slr pain Neuro: Strength and sensation grossly intact, CNII-XII grossly intact.  Assessment/ Plan:  64 y.o. female presenting with right hip pain for three weeks that is exacerbated with movement or prolonged sitting.  Patient is obese and has a history of arthritis and walks with a cane.  Pain is increased with palpation to posterior right hip and buttock.  Patient denies neuro symptoms such as numbness/tingling and loss of b/b control.  Likely muscular or sciatica related.  1. Type 2 diabetes mellitus with diabetic foot deformity (HCC) - POCT glycosylated hemoglobin (Hb A1C) 7.8% today - will follow-up regarding this next clinic visit  2. Right hip pain Discussed imaging with patient and she is reluctant to do that at this time.  We will treat with Lortab and increase Gabapentin to 300 mg TID, reassess pain in 2 weeks.  If pain not improved, recommend imaging at that time given her history of breast cancer  Follow-up Patient to return for reassessment in 2 weeks.  Will also discuss A1C at that time.  Patient given warning s/s for immediate return:  loss of bowel/bladder control, numbness/tingling, weakness.  Sherron Ales, NP Student [facility]  I saw patient with Ms Owens Shark and reviewed her note addended it and agree CHAMBLISS,MARSHALL L

## 2015-10-09 ENCOUNTER — Ambulatory Visit: Payer: Medicare Other | Admitting: Physical Therapy

## 2015-10-14 ENCOUNTER — Ambulatory Visit: Payer: Medicare Other

## 2015-10-14 DIAGNOSIS — M25631 Stiffness of right wrist, not elsewhere classified: Secondary | ICD-10-CM

## 2015-10-14 DIAGNOSIS — Z9181 History of falling: Secondary | ICD-10-CM | POA: Diagnosis not present

## 2015-10-14 DIAGNOSIS — R29898 Other symptoms and signs involving the musculoskeletal system: Secondary | ICD-10-CM | POA: Diagnosis not present

## 2015-10-14 DIAGNOSIS — M25551 Pain in right hip: Secondary | ICD-10-CM | POA: Diagnosis not present

## 2015-10-14 DIAGNOSIS — M25531 Pain in right wrist: Secondary | ICD-10-CM

## 2015-10-14 DIAGNOSIS — R531 Weakness: Secondary | ICD-10-CM | POA: Diagnosis not present

## 2015-10-14 NOTE — Therapy (Signed)
[place], [place], [address] Phone: [phone]   Fax:  [phone]  Physical Therapy Treatment  Patient Details  Name: [name] MRN: [id number] Date of Birth: January 20, 1951 Referring Provider: Thea Silversmith  Encounter Date: 10/14/2015      PT End of Session - 10/14/15 1121    Visit Number 7   Number of Visits 9   Date for PT Re-Evaluation 10/22/15   PT Start Time 1026   PT Stop Time 1117   PT Time Calculation (min) 51 min   Activity Tolerance Patient tolerated treatment well   Behavior During Therapy [facility] for tasks assessed/performed      Past Medical History  Diagnosis Date  . Hypertension   . Diabetes mellitus   . Depression   . Asthma   . Eczema   . Arthritis   . Cataract   . GERD (gastroesophageal reflux disease)   . History of kidney stones     w/ hx of hydronephrosis - followed by [facility]  . Anemia   . Obesity   . HIV nonspecific serology     2006: indeterminate HIV blood test, seen by ID, felt secondary to cross reacting antibodies with no further workup felt necessary at that time   . Fatty liver     Fatty infiltration of liver noted on 03/2012 CT scan  . CHF, acute ([place]) 05/03/2012  . Breast cancer of lower-outer quadrant of right female breast ([place]) 02/13/2015  . Radiation 05/07/15-06/23/15    Right Breast    Past Surgical History  Procedure Laterality Date  . Cystoscopy w/ litholapaxy / ehl    . Cholecystectomy  2003  . Replacement total knee bilateral  2005 &2006  . Joint replacement      bilateral knee replacement  . Vascular surgery Right 03/15/2013    Ultrasound guided sclerotherapy  . Left and right heart catheterization with coronary angiogram N/A 05/02/2012    Procedure: LEFT AND RIGHT HEART CATHETERIZATION WITH CORONARY ANGIOGRAM;  Surgeon: Burnell Blanks, MD;  Location: [facility] CATH LAB;  Service: Cardiovascular;  Laterality: N/A;  . Radioactive seed  guided mastectomy with axillary sentinel lymph node biopsy Right 03/21/2015    Procedure: RIGHT  PARTIAL MASTECTOMY WITH RADIOACTIVE SEED LOCALIZATION RIGHT  AXILLARY SENTINEL LYMPH NODE BIOPSY;  Surgeon: Fanny Skates, MD;  Location: [place];  Service: General;  Laterality: Right;    There were no vitals filed for this visit.  Visit Diagnosis:  Stiffness of joint, forearm, right  Pain in joint involving forearm, right  Weakness of right arm      Subjective Assessment - 10/14/15 1030    Subjective I came in but left for my last appt bc I was shaking and feeling so bad and I had diarrhea bad. Saw the doctor the next day and he thinks I had a virus and he gave me more pain meds and will see me in 2 weeks for xrays for my hip pain. The pain meds helped decrease that some.    Currently in Pain? Yes   Pain Score 6    Pain Location Hip   Pain Orientation Right   Pain Descriptors / Indicators Sharp;Throbbing   Pain Type Chronic pain   Pain Onset 1 to 4 weeks ago   Pain Frequency Constant   Aggravating Factors  walk on it or sit too long   Pain Relieving Factors pain meds have helped some   Pain Score 5  Pain Location Wrist   Pain Orientation Right   Pain Descriptors / Indicators Dull   Pain Type Chronic pain   Pain Onset More than a month ago   Pain Frequency Intermittent   Aggravating Factors  moving it too much but it's not as bad as it was; the cold weather doesn't help either   Pain Relieving Factors heat and therapy            [facility] PT Assessment - 10/14/15 0001    AROM   Right Wrist Extension 74 Degrees   Right Wrist Flexion 65 Degrees   Right Wrist Radial Deviation 26 Degrees   Right Wrist Ulnar Deviation 30 Degrees   Strength   Right Wrist Radial Deviation 5/5  Painful   Right Wrist Ulnar Deviation 5/5  No pain with this today              Katina Dung - 10/14/15 0001    Open a tight or new jar Moderate difficulty   Do heavy household  chores (wash walls, wash floors) Moderate difficulty   Carry a shopping bag or briefcase Mild difficulty   Wash your back Mild difficulty   Use a knife to cut food Mild difficulty   Recreational activities in which you take some force or impact through your arm, shoulder, or hand (golf, hammering, tennis) Mild difficulty   During the past week, to what extent has your arm, shoulder or hand problem interfered with your normal social activities with family, friends, neighbors, or groups? Modererately   During the past week, to what extent has your arm, shoulder or hand problem limited your work or other regular daily activities Slightly   Arm, shoulder, or hand pain. Mild   Tingling (pins and needles) in your arm, shoulder, or hand Mild   Difficulty Sleeping Mild difficulty   DASH Score 31.82 %               [facility] Adult PT Treatment/Exercise - 10/14/15 0001    Electrical Stimulation   Electrical Stimulation Location Rt wrist   Electrical Stimulation Action Premod using 1 channel   Electrical Stimulation Parameters 80-150Hz  x 10 minutes   Electrical Stimulation Goals Pain   Iontophoresis   Type of Iontophoresis Dexamethasone   Location Rt posterior medial wrist and fixated with Elastomull over wrist and hand today   Dose 1 ml 40 mA/min   Time 6 hr wear   Manual Therapy   Joint Mobilization gentle Rt wrist distraction and carpal bone glides    Soft tissue mobilization To Rt wrist with pt sitting in chair and UE resting on pillow on mat table working on all aspects of the wrist and palmar and dorsal aspects of hand and posterior forearm    Passive ROM To Rt wrist into flexion, extension, radial deviation and supination                        Long Term Clinic Goals - 10/14/15 1052    CC Long Term Goal  #1   Title Pt. will report at least 60% decrease in right wrist pain.  50% improvement reported 10/14/15   Status Partially Met   CC Long Term Goal  #2   Title Pt.  will show strength at 5/5 at right wrist, and without pain on manual muscle testing.  See muscle testing   Status Achieved   CC Long Term Goal  #3   Title Right wrist flexion to  at least 60 degrees.  65 degrees attained today 10/14/15   Status Achieved   CC Long Term Goal  #4   Title Reduce quick DASH score to less than 20, showing improved function.  31.82% limited at this time, pt seems to fluctuate with this though she reports feeling 50% improved   Status On-going            Plan - 10/14/15 1122    Clinical Impression Statement Pt has met all goals for Rt wrist pain except increasing her DASH score and has almost met her pain goal. Her AROM has improved as her wrist strength has as well.     Pt will benefit from skilled therapeutic intervention in order to improve on the following deficits Pain;Decreased range of motion;Decreased strength;Impaired UE functional use;Abnormal gait;Difficulty walking;Decreased balance   Rehab Potential Good   Clinical Impairments Affecting Rehab Potential radiation therapy, hx of fibromyalgia and chronic wrist  pain   PT Frequency 2x / week   PT Duration 4 weeks   PT Treatment/Interventions Passive range of motion;Therapeutic exercise;Manual techniques;Patient/family education;Iontophoresis 65m/ml Dexamethasone;Electrical Stimulation;ADLs/Self Care Home Management;Balance training;Gait training;Stair training;Therapeutic activities;Neuromuscular re-education;Functional mobility training   PT Next Visit Plan Continue ionto patch for Rt wrist pain and begin balance activities to work towards increasing BERG balance test score goal.   Consulted and Agree with Plan of Care Patient        Problem List Patient Active Problem List   Diagnosis Date Noted  . Hyperlipidemia 08/13/2015  . Breast cancer of [place]outer quadrant of right [place] 02/13/2015  . Vitamin D deficiency 02/06/2015  . Acute gouty arthritis 07/26/2014  . Type 2 diabetes  mellitus with diabetic foot deformity ([place] 01/01/2014  . Systolic [place]F, chronic ([place] 02/26/2013  . OBESITY 02/05/2009  . ASTHMA, PERSISTENT, MILD 02/08/2008  . Depression 05/18/2007  . HYPERTENSION, BENIGN SYSTEMIC 01/26/2007  . NEPHROLITHIASIS 01/26/2007  . DJD (degenerative joint disease), multiple sites 01/26/2007    ROtelia Limes PTA 10/14/2015, 11:25 AM  [place] [address]: [phone]  Fax:  [phone] Name: Kirsten SIHARATHMRN: [id number] of Birth: [birth year]

## 2015-10-16 ENCOUNTER — Ambulatory Visit: Payer: Medicare Other

## 2015-10-16 DIAGNOSIS — M25531 Pain in right wrist: Secondary | ICD-10-CM | POA: Diagnosis not present

## 2015-10-16 DIAGNOSIS — M25631 Stiffness of right wrist, not elsewhere classified: Secondary | ICD-10-CM | POA: Diagnosis not present

## 2015-10-16 DIAGNOSIS — M25551 Pain in right hip: Secondary | ICD-10-CM | POA: Diagnosis not present

## 2015-10-16 DIAGNOSIS — R29898 Other symptoms and signs involving the musculoskeletal system: Secondary | ICD-10-CM | POA: Diagnosis not present

## 2015-10-16 DIAGNOSIS — Z9181 History of falling: Secondary | ICD-10-CM

## 2015-10-16 DIAGNOSIS — R531 Weakness: Secondary | ICD-10-CM | POA: Diagnosis not present

## 2015-10-16 NOTE — Therapy (Signed)
[place], [place], [address] Phone: [phone]   Fax:  [phone]  Physical Therapy Treatment  Patient Details  Name: [name] MRN: [id number] Date of Birth: 12-14-1950 Referring Provider: Thea Silversmith  Encounter Date: 10/16/2015      PT End of Session - 10/16/15 1209    Visit Number 8   Number of Visits 9   Date for PT Re-Evaluation 10/22/15   PT Start Time 1020   PT Stop Time 1105   PT Time Calculation (min) 45 min   Activity Tolerance Patient tolerated treatment well;Patient limited by pain   Behavior During Therapy [facility] for tasks assessed/performed      Past Medical History  Diagnosis Date  . Hypertension   . Diabetes mellitus   . Depression   . Asthma   . Eczema   . Arthritis   . Cataract   . GERD (gastroesophageal reflux disease)   . History of kidney stones     w/ hx of hydronephrosis - followed by [facility]  . Anemia   . Obesity   . HIV nonspecific serology     2006: indeterminate HIV blood test, seen by ID, felt secondary to cross reacting antibodies with no further workup felt necessary at that time   . Fatty liver     Fatty infiltration of liver noted on 03/2012 CT scan  . CHF, acute ([place]) 05/03/2012  . Breast cancer of lower-outer quadrant of right female breast ([place]) 02/13/2015  . Radiation 05/07/15-06/23/15    Right Breast    Past Surgical History  Procedure Laterality Date  . Cystoscopy w/ litholapaxy / ehl    . Cholecystectomy  2003  . Replacement total knee bilateral  2005 &2006  . Joint replacement      bilateral knee replacement  . Vascular surgery Right 03/15/2013    Ultrasound guided sclerotherapy  . Left and right heart catheterization with coronary angiogram N/A 05/02/2012    Procedure: LEFT AND RIGHT HEART CATHETERIZATION WITH CORONARY ANGIOGRAM;  Surgeon: Burnell Blanks, MD;  Location: [facility] CATH LAB;  Service: Cardiovascular;  Laterality:  N/A;  . Radioactive seed guided mastectomy with axillary sentinel lymph node biopsy Right 03/21/2015    Procedure: RIGHT  PARTIAL MASTECTOMY WITH RADIOACTIVE SEED LOCALIZATION RIGHT  AXILLARY SENTINEL LYMPH NODE BIOPSY;  Surgeon: Fanny Skates, MD;  Location: [place];  Service: General;  Laterality: Right;    There were no vitals filed for this visit.  Visit Diagnosis:  Stiffness of joint, forearm, right  Pain in joint involving forearm, right  Generalized weakness  Risk for falls      Subjective Assessment - 10/16/15 1037    Subjective My hip is still hurting but not worse than it was the other day. My wrist continues to feel better.    Currently in Pain? Yes   Pain Score 6    Pain Location Hip   Pain Orientation Right   Pain Descriptors / Indicators Sharp;Throbbing   Pain Type Chronic pain   Pain Onset 1 to 4 weeks ago   Pain Frequency Constant   Aggravating Factors  walk on it or sit too long   Pain Relieving Factors pain meds have helped some                         [facility] Adult PT Treatment/Exercise - 10/16/15 0001    Neuro Re-ed    Neuro Re-ed Details  In // bars: Slow marching 2 reps being mindful of Rt hip pain which pt reported was minimal with this, bil sidestepping 1 rep each direction, seated rest after. Tried retro walking but increased Rt hip pain so stopped, then heel-toe gait 2 reps no pain with this but challenging.    Knee/Hip Exercises: Aerobic   Nustep Level 6 x 10 minutes with PTA present to monitor pt   Knee/Hip Exercises: Standing   Hip Flexion Right;10 reps;Stengthening   Hip Flexion Limitations 5 reps with Lt LE but had to stop due to increase Rt hip pain with SLS.   Knee/Hip Exercises: Seated   Long Arc Quad Strengthening;Both;1 set;15 reps;Weights   Long Arc Quad Weight 2 lbs.   Ball Squeeze 2 sets of 10 reps with 3-5 second holds   Clamshell with TheraBand Yellow  2 sets of 10 reps   Marching --    Iontophoresis   Type of Iontophoresis Dexamethasone   Location Rt posterior medial wrist and fixated with Elastomull over wrist and hand today   Dose 1 ml 40 mA/min   Time 6 hr wear                        Long Term Clinic Goals - 10/14/15 1052    CC Long Term Goal  #1   Title Pt. will report at least 60% decrease in right wrist pain.  50% improvement reported 10/14/15   Status Partially Met   CC Long Term Goal  #2   Title Pt. will show strength at 5/5 at right wrist, and without pain on manual muscle testing.  See muscle testing   Status Achieved   CC Long Term Goal  #3   Title Right wrist flexion to at least 60 degrees.  65 degrees attained today 10/14/15   Status Achieved   CC Long Term Goal  #4   Title Reduce quick DASH score to less than 20, showing improved function.  31.82% limited at this time, pt seems to fluctuate with this though she reports feeling 50% improved   Status On-going            Plan - 10/16/15 1210    Clinical Impression Statement Pts Rt hip pain did limiit some of our activities today, she is going back to her PCP next week and they are going to, per pt, do an xray of her Rt hip. We have switched focus now to incorporate balance activities and LE strength as was the original plan.    Pt will benefit from skilled therapeutic intervention in order to improve on the following deficits Pain;Decreased range of motion;Decreased strength;Impaired UE functional use;Abnormal gait;Difficulty walking;Decreased balance   Rehab Potential Good   Clinical Impairments Affecting Rehab Potential radiation therapy, hx of fibromyalgia and chronic wrist  pain; has been experiencing Rthip pain which limits her standing/sitting activities, sees her PCP for this week of 10/20/15   PT Frequency 2x / week   PT Duration 4 weeks   PT Treatment/Interventions Passive range of motion;Therapeutic exercise;Manual techniques;Patient/family education;Iontophoresis 37m/ml  Dexamethasone;Electrical Stimulation;ADLs/Self Care Home Management;Balance training;Gait training;Stair training;Therapeutic activities;Neuromuscular re-education;Functional mobility training   PT Next Visit Plan Continue ionto patch for Rt wrist pain and cont balance activities to work towards increasing BERG balance test score goal as Rt hip pain allows.   Consulted and Agree with Plan of Care Patient        Problem List Patient Active Problem List   Diagnosis Date  Noted  . Hyperlipidemia 08/13/2015  . Breast cancer of lower-outer quadrant of right female breast ([place]) 02/13/2015  . Vitamin D deficiency 02/06/2015  . Acute gouty arthritis 07/26/2014  . Type 2 diabetes mellitus with diabetic foot deformity ([place]) 01/01/2014  . Systolic CHF, chronic ([place]) 02/26/2013  . OBESITY 02/05/2009  . ASTHMA, PERSISTENT, MILD 02/08/2008  . Depression 05/18/2007  . HYPERTENSION, BENIGN SYSTEMIC 01/26/2007  . NEPHROLITHIASIS 01/26/2007  . DJD (degenerative joint disease), multiple sites 01/26/2007    Otelia Limes, PTA 10/16/2015, 12:16 PM  [place], [place], [address] Phone: [phone]   Fax:  [phone]  Name: GAYLIA KASSEL MRN: [id number] Date of Birth: 05-Aug-1951

## 2015-10-19 ENCOUNTER — Other Ambulatory Visit: Payer: Self-pay | Admitting: Family Medicine

## 2015-10-20 ENCOUNTER — Ambulatory Visit: Payer: Medicare Other | Admitting: Physical Therapy

## 2015-10-20 DIAGNOSIS — M25551 Pain in right hip: Secondary | ICD-10-CM | POA: Diagnosis not present

## 2015-10-20 DIAGNOSIS — M25531 Pain in right wrist: Secondary | ICD-10-CM | POA: Diagnosis not present

## 2015-10-20 DIAGNOSIS — M25631 Stiffness of right wrist, not elsewhere classified: Secondary | ICD-10-CM | POA: Diagnosis not present

## 2015-10-20 DIAGNOSIS — Z9181 History of falling: Secondary | ICD-10-CM

## 2015-10-20 DIAGNOSIS — R531 Weakness: Secondary | ICD-10-CM | POA: Diagnosis not present

## 2015-10-20 DIAGNOSIS — R29898 Other symptoms and signs involving the musculoskeletal system: Secondary | ICD-10-CM | POA: Diagnosis not present

## 2015-10-20 NOTE — Therapy (Signed)
[place], [place], [address] Phone: [phone]   Fax:  [phone]  Physical Therapy Treatment  Patient Details  Name: [name] MRN: [id number] Date of Birth: 07/08/1951 Referring Provider: Thea Silversmith  Encounter Date: 10/20/2015      PT End of Session - 10/20/15 1425    Visit Number 9   Number of Visits 17   Date for PT Re-Evaluation 11/21/15   PT Start Time 1350   PT Stop Time 1430   PT Time Calculation (min) 40 min   Activity Tolerance Patient limited by pain   Behavior During Therapy [facility] for tasks assessed/performed      Past Medical History  Diagnosis Date  . Hypertension   . Diabetes mellitus   . Depression   . Asthma   . Eczema   . Arthritis   . Cataract   . GERD (gastroesophageal reflux disease)   . History of kidney stones     w/ hx of hydronephrosis - followed by [facility]  . Anemia   . Obesity   . HIV nonspecific serology     2006: indeterminate HIV blood test, seen by ID, felt secondary to cross reacting antibodies with no further workup felt necessary at that time   . Fatty liver     Fatty infiltration of liver noted on 03/2012 CT scan  . CHF, acute ([place]) 05/03/2012  . Breast cancer of lower-outer quadrant of right female breast ([place]) 02/13/2015  . Radiation 05/07/15-06/23/15    Right Breast    Past Surgical History  Procedure Laterality Date  . Cystoscopy w/ litholapaxy / ehl    . Cholecystectomy  2003  . Replacement total knee bilateral  2005 &2006  . Joint replacement      bilateral knee replacement  . Vascular surgery Right 03/15/2013    Ultrasound guided sclerotherapy  . Left and right heart catheterization with coronary angiogram N/A 05/02/2012    Procedure: LEFT AND RIGHT HEART CATHETERIZATION WITH CORONARY ANGIOGRAM;  Surgeon: Burnell Blanks, MD;  Location: [facility] CATH LAB;  Service: Cardiovascular;  Laterality: N/A;  . Radioactive seed guided  mastectomy with axillary sentinel lymph node biopsy Right 03/21/2015    Procedure: RIGHT  PARTIAL MASTECTOMY WITH RADIOACTIVE SEED LOCALIZATION RIGHT  AXILLARY SENTINEL LYMPH NODE BIOPSY;  Surgeon: Fanny Skates, MD;  Location: [place];  Service: General;  Laterality: Right;    There were no vitals filed for this visit.  Visit Diagnosis:  Generalized weakness - Plan: PT plan of care cert/re-cert  Risk for falls - Plan: PT plan of care cert/re-cert  Right hip pain - Plan: PT plan of care cert/re-cert      Subjective Assessment - 10/20/15 1356    Subjective My wrist is feeing better except for the cold weather if I use it too much.  Overall it is better.  She says her hip is bothering her and has been for a while.  Pt wants to continue coming to PT to work on her balance.She is going to see her PCP on Wednesday and hopes that she will get an xray for her hip t.   Pertinent History Has a tremor in both hands, perhaps right > left.  Sprained right wrist bafdy when taking care ofher husband; had a brace that she wore and wonders if she should use it again. Got the brace when she was in [place] home about a year ago; they diagnosed gout.  Is  on a medication for gout.  HTN controlled with meds.  Diagnosed with right breast cancer March 2016 with lumpectomy and SLNB 03/21/15.  Finished radiation 06/23/15. h/o kidney stones; cholecystectomy many years ago; bilat. TKAs about 10 years ago.  h/o bilat. vein stripping about a year ago.  Surgery on both ears to remove cheloids.  CHF, asthma.     Pt reports she has fibromyalgia and has generalized pain too                                                                                                             Patient Stated Goals pain in wrist ease up and to strenghten her wrist a little more She also wants to walk better.    Pain Score 9   pt twisted in bed this morning to pick up the phone andit has been hurting since.    Pain  Location Hip   Pain Orientation Right   Pain Descriptors / Indicators Sharp;Throbbing   Pain Type Chronic pain   Pain Onset More than a month ago  about a month and a half    Pain Frequency Constant   Aggravating Factors  twisting, walking and standing up    Pain Relieving Factors Pain patch on hip    Effect of Pain on Daily Activities pt has difficilty with all mobillity.  She is having diffculty getting comfortable today for treatment                    Katina Dung - 10/20/15 0001    Open a tight or new jar Moderate difficulty   Do heavy household chores (wash walls, wash floors) No difficulty   Carry a shopping bag or briefcase No difficulty   Wash your back No difficulty   Use a knife to cut food Mild difficulty   Recreational activities in which you take some force or impact through your arm, shoulder, or hand (golf, hammering, tennis) Mild difficulty   During the past week, to what extent has your arm, shoulder or hand problem interfered with your normal social activities with family, friends, neighbors, or groups? Not at all   During the past week, to what extent has your arm, shoulder or hand problem limited your work or other regular daily activities Slightly   Arm, shoulder, or hand pain. Mild   Tingling (pins and needles) in your arm, shoulder, or hand Mild   Difficulty Sleeping Mild difficulty   DASH Score 18.18 %               [facility] Adult PT Treatment/Exercise - 10/20/15 0001    Knee/Hip Exercises: Sidelying   Hip ABduction 5 reps   Hip ABduction Limitations hurts "a little "    Other Sidelying Knee/Hip Exercises right knee flex/extension    Other Sidelying Knee/Hip Exercises abdominal sets with right arm reaching overhead.   Moist Heat Therapy   Number Minutes Moist Heat 15 Minutes  pt in left sidelying.  Feels decrease in pain with hot pack  Moist Heat Location Hip                        Long Term Clinic Goals - 10/20/15 1413     CC Long Term Goal  #1   Title Pt. will report at least 60% decrease in right wrist pain.   Status Achieved   CC Long Term Goal  #2   Title Pt. will show strength at 5/5 at right wrist, and without pain on manual muscle testing.   Status Achieved   CC Long Term Goal  #3   Title Right wrist flexion to at least 60 degrees.   Status Achieved   CC Long Term Goal  #4   Title Reduce quick DASH score to less than 20, showing improved function.   Baseline 20.45 on 09/24/2015  18.18 on 10/20/15   Status Achieved   CC Long Term Goal  #5   Title increase score on BERG to 46 demonstrating decreased risk for falls   Status On-going   CC Long Term Goal  #6   Title pt will report right hip pain is decreased by 50%   Time 4   Period Weeks   Status New   Additional Goals   Additional Goals Yes            Plan - 10/20/15 1426    Clinical Impression Statement All goals for her wrist pain have been achieved, but pt is limited by her hip pain today.  She is going to her PCP this week and hopes for an xray. She received some symptomatic relief from moist heat and was able to pariticipate with some exercise.  She wants to keep coming to PT for 4 more weeks to improve her balance  and leg stregth. She is hoping that wil help with the pain also.    Pt will benefit from skilled therapeutic intervention in order to improve on the following deficits Pain;Decreased strength;Abnormal gait;Difficulty walking;Decreased balance   Rehab Potential Good   Clinical Impairments Affecting Rehab Potential radiation therapy, hx of fibromyalgia and chronic wrist  pain; has been experiencing Rthip pain which limits her standing/sitting activities, sees her PCP for this week of 10/20/15   PT Frequency 2x / week   PT Duration 4 weeks   PT Treatment/Interventions Passive range of motion;Therapeutic exercise;Manual techniques;Patient/family education;ADLs/Self Care Home Management;Balance training;Gait training;Stair  training;Therapeutic activities;Neuromuscular re-education;Functional mobility training;DME Instruction;Moist Heat   PT Next Visit Plan Use moist heat for symptom relief progress with LE strength and balance activities  Do G code   Consulted and Agree with Plan of Care Patient        Problem List Patient Active Problem List   Diagnosis Date Noted  . Hyperlipidemia 08/13/2015  . Breast cancer of lower-outer quadrant of right female breast ([place]) 02/13/2015  . Vitamin D deficiency 02/06/2015  . Acute gouty arthritis 07/26/2014  . Type 2 diabetes mellitus with diabetic foot deformity ([place]) 01/01/2014  . Systolic CHF, chronic ([place]) 02/26/2013  . OBESITY 02/05/2009  . ASTHMA, PERSISTENT, MILD 02/08/2008  . Depression 05/18/2007  . HYPERTENSION, BENIGN SYSTEMIC 01/26/2007  . NEPHROLITHIASIS 01/26/2007  . DJD (degenerative joint disease), multiple sites 01/26/2007   Donato Heinz. Owens Shark, PT  10/20/2015, 6:02 PM  [place], [place], [address] Phone: [phone]   Fax:  [phone]  Name: [name] MRN: [id number] Date of Birth: [birth year]

## 2015-10-22 ENCOUNTER — Encounter: Payer: Self-pay | Admitting: Family Medicine

## 2015-10-22 ENCOUNTER — Ambulatory Visit: Payer: Medicare Other

## 2015-10-22 ENCOUNTER — Ambulatory Visit (INDEPENDENT_AMBULATORY_CARE_PROVIDER_SITE_OTHER): Payer: Medicare Other | Admitting: Family Medicine

## 2015-10-22 VITALS — BP 120/71 | HR 73 | Temp 97.6°F | Ht 62.0 in | Wt 226.0 lb

## 2015-10-22 DIAGNOSIS — M5441 Lumbago with sciatica, right side: Secondary | ICD-10-CM | POA: Diagnosis not present

## 2015-10-22 DIAGNOSIS — M25631 Stiffness of right wrist, not elsewhere classified: Secondary | ICD-10-CM | POA: Diagnosis not present

## 2015-10-22 DIAGNOSIS — M25551 Pain in right hip: Secondary | ICD-10-CM | POA: Diagnosis not present

## 2015-10-22 DIAGNOSIS — Z9181 History of falling: Secondary | ICD-10-CM | POA: Diagnosis not present

## 2015-10-22 DIAGNOSIS — I10 Essential (primary) hypertension: Secondary | ICD-10-CM | POA: Diagnosis not present

## 2015-10-22 DIAGNOSIS — R531 Weakness: Secondary | ICD-10-CM | POA: Diagnosis not present

## 2015-10-22 DIAGNOSIS — R29898 Other symptoms and signs involving the musculoskeletal system: Secondary | ICD-10-CM | POA: Diagnosis not present

## 2015-10-22 DIAGNOSIS — M25531 Pain in right wrist: Secondary | ICD-10-CM

## 2015-10-22 DIAGNOSIS — R7989 Other specified abnormal findings of blood chemistry: Secondary | ICD-10-CM | POA: Diagnosis not present

## 2015-10-22 DIAGNOSIS — R945 Abnormal results of liver function studies: Secondary | ICD-10-CM

## 2015-10-22 DIAGNOSIS — M549 Dorsalgia, unspecified: Secondary | ICD-10-CM | POA: Insufficient documentation

## 2015-10-22 LAB — HEPATIC FUNCTION PANEL
ALT: 44 U/L — ABNORMAL HIGH (ref 6–29)
AST: 19 U/L (ref 10–35)
Albumin: 4 g/dL (ref 3.6–5.1)
Alkaline Phosphatase: 173 U/L — ABNORMAL HIGH (ref 33–130)
Bilirubin, Direct: 0.2 mg/dL
Indirect Bilirubin: 0.3 mg/dL (ref 0.2–1.2)
Total Bilirubin: 0.5 mg/dL (ref 0.2–1.2)
Total Protein: 7.5 g/dL (ref 6.1–8.1)

## 2015-10-22 LAB — POCT SEDIMENTATION RATE: POCT SED RATE: 41 mm/hr — AB (ref 0–22)

## 2015-10-22 MED ORDER — GABAPENTIN 100 MG PO CAPS: ORAL_CAPSULE | ORAL | Status: DC

## 2015-10-22 MED ORDER — OXYCODONE-ACETAMINOPHEN 5-325 MG PO TABS: 1.0000 | ORAL_TABLET | Freq: Three times a day (TID) | ORAL | Status: DC | PRN

## 2015-10-22 NOTE — Assessment & Plan Note (Signed)
Worsened.  Most consistent with nerve root impingement.  Given history of cancer will check xray and ESR.   Increase gabapentin and narcotic Monitor closely

## 2015-10-22 NOTE — Assessment & Plan Note (Signed)
BP Readings from Last 3 Encounters:  10/22/15 120/71  10/08/15 145/77  09/26/15 140/78   At goal continue medications

## 2015-10-22 NOTE — Progress Notes (Signed)
   Subjective:    Patient ID: [name], female    DOB: 06-Sep-1951, 64 y.o.   MRN: [id number]  HPI  R sided Back Pain Worsened since last seen. Pain is at R lower back with some radiation down buttock.  No weakness or incontinence.  No rash.  No fall.  Gabapentin and hydrocodone not helping much.  Has not had xrays  HYPERTENSION Disease Monitoring Home BP Monitoring (Severity) readings at home have been mostly < 140/90 Brought cuff today Symptoms - Chest pain- no    Dyspnea- no Medications(Modifying factors) Compliance-  Daily meds. Lightheadedness-  no  Edema- no Timing - continuous  Duration - years ROS - See HPI  PMH Lab Review   POTASSIUM  Date Value Ref Range Status  09/26/2015 4.7 3.5 - 5.1 mEq/L Final  01/22/2015 4.7 3.5 - 5.3 mEq/L Final   SODIUM  Date Value Ref Range Status  09/26/2015 142 136 - 145 mEq/L Final  01/22/2015 141 135 - 145 mEq/L Final   CREATININE  Date Value Ref Range Status  09/26/2015 1.5* 0.6 - 1.1 mg/dL Final   CREAT  Date Value Ref Range Status  01/22/2015 1.38* 0.50 - 1.10 mg/dL Final   CREATININE, SER  Date Value Ref Range Status  07/25/2014 1.17* 0.50 - 1.10 mg/dL Final           Chief Complaint noted Review of Symptoms - see HPI PMH - Smoking status noted.   Vital Signs reviewed    Review of Systems     Objective:   Physical Exam  Alert moderate distress  Back - tender R lateral paraspinous muscle area.  No central tenderness.No SLR pain. Mild pain with ROM of R hip.  No specific sciatic notch tenderness Neuro - Lower Extrem strength is 5/5 bilaterally. Patient walks with a cane      Assessment & Plan:

## 2015-10-22 NOTE — Patient Instructions (Signed)
Sorry about all the pain!  Take gabapentin 4-5 tabs three times daily   Stop the hydrocodone then take oxycodone 1 three times daily as needed - watch for balance and constipation  I will call you with the results of the xray and blood tests  Come back in 1 month if you are not feeling better if you are then come back in 2 months

## 2015-10-22 NOTE — Therapy (Signed)
[place], [place], [address] Phone: [phone]   Fax:  [phone]  Physical Therapy Treatment  Patient Details  Name: [name] MRN: [id number] Date of Birth: 12-14-1950 Referring Provider: Thea Silversmith  Encounter Date: 10/22/2015      PT End of Session - 10/22/15 1012    Visit Number 10   Number of Visits 17   Date for PT Re-Evaluation 11/21/15   PT Start Time 0932   PT Stop Time 1017   PT Time Calculation (min) 45 min   Activity Tolerance Patient limited by pain   Behavior During Therapy [facility] for tasks assessed/performed      Past Medical History  Diagnosis Date  . Hypertension   . Diabetes mellitus   . Depression   . Asthma   . Eczema   . Arthritis   . Cataract   . GERD (gastroesophageal reflux disease)   . History of kidney stones     w/ hx of hydronephrosis - followed by [facility]  . Anemia   . Obesity   . HIV nonspecific serology     2006: indeterminate HIV blood test, seen by ID, felt secondary to cross reacting antibodies with no further workup felt necessary at that time   . Fatty liver     Fatty infiltration of liver noted on 03/2012 CT scan  . CHF, acute ([place]) 05/03/2012  . Breast cancer of lower-outer quadrant of right female breast ([place]) 02/13/2015  . Radiation 05/07/15-06/23/15    Right Breast    Past Surgical History  Procedure Laterality Date  . Cystoscopy w/ litholapaxy / ehl    . Cholecystectomy  2003  . Replacement total knee bilateral  2005 &2006  . Joint replacement      bilateral knee replacement  . Vascular surgery Right 03/15/2013    Ultrasound guided sclerotherapy  . Left and right heart catheterization with coronary angiogram N/A 05/02/2012    Procedure: LEFT AND RIGHT HEART CATHETERIZATION WITH CORONARY ANGIOGRAM;  Surgeon: Burnell Blanks, MD;  Location: [facility] CATH LAB;  Service: Cardiovascular;  Laterality: N/A;  . Radioactive seed guided  mastectomy with axillary sentinel lymph node biopsy Right 03/21/2015    Procedure: RIGHT  PARTIAL MASTECTOMY WITH RADIOACTIVE SEED LOCALIZATION RIGHT  AXILLARY SENTINEL LYMPH NODE BIOPSY;  Surgeon: Fanny Skates, MD;  Location: [place];  Service: General;  Laterality: Right;    There were no vitals filed for this visit.  Visit Diagnosis:  Generalized weakness  Risk for falls  Right hip pain  Stiffness of joint, forearm, right  Pain in joint involving forearm, right      Subjective Assessment - 10/22/15 0947    Subjective My Rt hip is really bothering me today. Monday morning I rolled over in bed onto my Rt hip accidentally and my hip has been hurting worse than it was last week since then.    Currently in Pain? Yes   Pain Score 9    Pain Location Hip   Pain Orientation Right   Pain Descriptors / Indicators Sharp;Throbbing   Pain Type Chronic pain   Pain Onset More than a month ago   Pain Frequency Constant   Aggravating Factors  walking, turning with foot planted   Pain Relieving Factors heat helped some last itme, pain meds                         [facility] Adult PT  Treatment/Exercise - 10/22/15 0001    Knee/Hip Exercises: Aerobic   Nustep Level 4 x11 minutes with therapist present to monitor pts pain. She reported 6/10 after, moving in a pain free range helped.  Pt required SBA with gait today due to pain   Moist Heat Therapy   Number Minutes Moist Heat 15 Minutes  In Lt S/L, pt reports this really gave her some relief    Moist Heat Location Hip  Rt   Iontophoresis   Type of Iontophoresis Dexamethasone  Last treatment of this today   Location Rt posterior medial wrist and fixated with Elastomull over wrist and hand today   Dose 1 ml 40 mA/min   Time 6 hr wear                        Long Term Clinic Goals - 10/20/15 1413    CC Long Term Goal  #1   Title Pt. will report at least 60% decrease in right wrist pain.    Status Achieved   CC Long Term Goal  #2   Title Pt. will show strength at 5/5 at right wrist, and without pain on manual muscle testing.   Status Achieved   CC Long Term Goal  #3   Title Right wrist flexion to at least 60 degrees.   Status Achieved   CC Long Term Goal  #4   Title Reduce quick DASH score to less than 20, showing improved function.   Baseline 20.45 on 09/24/2015  18.18 on 10/20/15   Status Achieved   CC Long Term Goal  #5   Title increase score on BERG to 46 demonstrating decreased risk for falls   Status On-going   CC Long Term Goal  #6   Title pt will report right hip pain is decreased by 50%   Time 4   Period Weeks   Status New   Additional Goals   Additional Goals Yes            Plan - 10/22/15 0952    Clinical Impression Statement Pt still in alot of Rt hip pain today which limited what activities she could tolerate. She sees her PCP today for this issue. Continues to receive symptomatic relief from heat and pt requested to just do heat today once she laid down due to hip pain had increased from walking from the NuStep to the room.    Pt will benefit from skilled therapeutic intervention in order to improve on the following deficits Pain;Decreased strength;Abnormal gait;Difficulty walking;Decreased balance   Rehab Potential Good   Clinical Impairments Affecting Rehab Potential radiation therapy, hx of fibromyalgia and chronic wrist  pain; has been experiencing Rthip pain which limits her standing/sitting activities, sees her PCP for this week of 10/20/15   PT Frequency 2x / week   PT Duration 4 weeks   PT Treatment/Interventions Passive range of motion;Therapeutic exercise;Manual techniques;Patient/family education;ADLs/Self Care Home Management;Balance training;Gait training;Stair training;Therapeutic activities;Neuromuscular re-education;Functional mobility training;DME Instruction;Moist Heat   PT Next Visit Plan Discharge ionto patch. Cont moist heat for  symptom relief, progress with LE strength andbalance activites. G code done today.   Consulted and Agree with Plan of Care Patient        Problem List Patient Active Problem List   Diagnosis Date Noted  . Hyperlipidemia 08/13/2015  . Breast cancer of lower-outer quadrant of right female breast ([place]) 02/13/2015  . Vitamin D deficiency 02/06/2015  . Acute gouty arthritis 07/26/2014  .  Type 2 diabetes mellitus with diabetic foot deformity ([place]) 01/01/2014  . Systolic CHF, chronic ([place]) 02/26/2013  . OBESITY 02/05/2009  . ASTHMA, PERSISTENT, MILD 02/08/2008  . Depression 05/18/2007  . HYPERTENSION, BENIGN SYSTEMIC 01/26/2007  . NEPHROLITHIASIS 01/26/2007  . DJD (degenerative joint disease), multiple sites 01/26/2007    [name], PTA 10/22/2015, 10:17 AM  [place], [place], [address] Phone: [phone]   Fax:  [phone]  Name: [name] MRN: [id number] Date of Birth: May 03, 1951    Physical Therapy Progress Note  Dates of Reporting Period: 08/05/15 to 10/22/15  Objective Reports of Subjective Statement: Right wrist strength measures 5/5 and flexion at least 60 degrees as noted above.  Objective Measurements: See goals above.  Goal Update: See goals above.  Plan: Continue therapy to work on balance.  Reason Skilled Services are Required: balance deficit.  [name], PT 10/22/2015 12:07 PM

## 2015-10-22 NOTE — Assessment & Plan Note (Signed)
Recheck labs 

## 2015-10-27 ENCOUNTER — Telehealth: Payer: Self-pay | Admitting: Family Medicine

## 2015-10-27 ENCOUNTER — Ambulatory Visit: Payer: Medicare Other | Admitting: Physical Therapy

## 2015-10-27 ENCOUNTER — Ambulatory Visit
Admission: RE | Admit: 2015-10-27 | Discharge: 2015-10-27 | Disposition: A | Discharge status: Home / Self Care | Payer: Medicare Other | Source: Ambulatory Visit | Attending: Family Medicine | Admitting: Family Medicine

## 2015-10-27 ENCOUNTER — Encounter: Payer: Self-pay | Admitting: Physical Therapy

## 2015-10-27 DIAGNOSIS — R531 Weakness: Secondary | ICD-10-CM | POA: Diagnosis not present

## 2015-10-27 DIAGNOSIS — Z9181 History of falling: Secondary | ICD-10-CM

## 2015-10-27 DIAGNOSIS — M25531 Pain in right wrist: Secondary | ICD-10-CM | POA: Diagnosis not present

## 2015-10-27 DIAGNOSIS — M5136 Other intervertebral disc degeneration, lumbar region: Secondary | ICD-10-CM | POA: Diagnosis not present

## 2015-10-27 DIAGNOSIS — M5441 Lumbago with sciatica, right side: Secondary | ICD-10-CM

## 2015-10-27 DIAGNOSIS — M25551 Pain in right hip: Secondary | ICD-10-CM | POA: Diagnosis not present

## 2015-10-27 DIAGNOSIS — R29898 Other symptoms and signs involving the musculoskeletal system: Secondary | ICD-10-CM | POA: Diagnosis not present

## 2015-10-27 DIAGNOSIS — M25631 Stiffness of right wrist, not elsewhere classified: Secondary | ICD-10-CM | POA: Diagnosis not present

## 2015-10-27 NOTE — Telephone Encounter (Signed)
Called about labs xray  Told no signs of cancer or fracture but arthritis Labs look better  Continue current treatment but if not better in 2-3 weeks or if any worse back leg pain to come in   She agrees

## 2015-10-27 NOTE — Therapy (Signed)
[place], [place], [address] Phone: [phone]   Fax:  [phone]  Physical Therapy Treatment  Patient Details  Name: [name] MRN: [id number] Date of Birth: 03/10/1951 Referring Provider: Thea Silversmith  Encounter Date: 10/27/2015      PT End of Session - 10/27/15 1314    Visit Number 11   Number of Visits 17   Date for PT Re-Evaluation 11/21/15   PT Start Time 1300   PT Stop Time 1345   PT Time Calculation (min) 45 min   Activity Tolerance Patient limited by pain   Behavior During Therapy [facility] for tasks assessed/performed      Past Medical History  Diagnosis Date  . Hypertension   . Diabetes mellitus   . Depression   . Asthma   . Eczema   . Arthritis   . Cataract   . GERD (gastroesophageal reflux disease)   . History of kidney stones     w/ hx of hydronephrosis - followed by [facility]  . Anemia   . Obesity   . HIV nonspecific serology     2006: indeterminate HIV blood test, seen by ID, felt secondary to cross reacting antibodies with no further workup felt necessary at that time   . Fatty liver     Fatty infiltration of liver noted on 03/2012 CT scan  . CHF, acute ([place]) 05/03/2012  . Breast cancer of lower-outer quadrant of right female breast ([place]) 02/13/2015  . Radiation 05/07/15-06/23/15    Right Breast    Past Surgical History  Procedure Laterality Date  . Cystoscopy w/ litholapaxy / ehl    . Cholecystectomy  2003  . Replacement total knee bilateral  2005 &2006  . Joint replacement      bilateral knee replacement  . Vascular surgery Right 03/15/2013    Ultrasound guided sclerotherapy  . Left and right heart catheterization with coronary angiogram N/A 05/02/2012    Procedure: LEFT AND RIGHT HEART CATHETERIZATION WITH CORONARY ANGIOGRAM;  Surgeon: Burnell Blanks, MD;  Location: [facility] CATH LAB;  Service: Cardiovascular;  Laterality: N/A;  . Radioactive seed guided  mastectomy with axillary sentinel lymph node biopsy Right 03/21/2015    Procedure: RIGHT  PARTIAL MASTECTOMY WITH RADIOACTIVE SEED LOCALIZATION RIGHT  AXILLARY SENTINEL LYMPH NODE BIOPSY;  Surgeon: Fanny Skates, MD;  Location: [place];  Service: General;  Laterality: Right;    There were no vitals filed for this visit.  Visit Diagnosis:  Generalized weakness  Risk for falls  Right hip pain      Subjective Assessment - 10/27/15 1307    Subjective My right hip is still very painful.  I can't sleep.  I got x-rays of my hip this morning and got bloodwork last week but I don't have the results yet.   Pertinent History Has a tremor in both hands, perhaps right > left.  Sprained right wrist bafdy when taking care ofher husband; had a brace that she wore and wonders if she should use it again. Got the brace when she was in [facility] about a year ago; they diagnosed gout.  Is on a medication for gout.  HTN controlled with meds.  Diagnosed with right breast cancer March 2016 with lumpectomy and SLNB 03/21/15.  Finished radiation 06/23/15. h/o kidney stones; cholecystectomy many years ago; bilat. TKAs about 10 years ago.  h/o bilat. vein stripping about a year ago.  Surgery on both ears to remove cheloids.  CHF, asthma.     Pt reports she has fibromyalgia and has generalized pain too                                                                                                             Currently in Pain? Yes   Pain Score 9    Pain Location Hip   Pain Orientation Right   Pain Descriptors / Indicators Constant;Sharp   Pain Type Chronic pain   Pain Onset More than a month ago   Pain Frequency Constant   Aggravating Factors  sit to stand   Pain Relieving Factors heat   Multiple Pain Sites No                         [facility] Adult PT Treatment/Exercise - 10/27/15 0001    Lumbar Exercises: Stretches   Single Knee to Chest Stretch 3 reps;10 seconds  BLE    Lower Trunk Rotation 5 reps  Holding 5 seconds each side   Pelvic Tilt --  10 reps   Lumbar Exercises: Seated   Other Seated Lumbar Exercises (p) Seated pelvic lateral rocking and anterior/posterior rocking x10 each direction   Lumbar Exercises: Supine   Other Supine Lumbar Exercises Supine in hooklying: ankle dorsiflexion toe taps x10 each leg, heel raises x10 each leg, marching x10 bilaterally, hip ER (feet flat on mat, knees apart) x10, hip adduction ball squeeze x10 with 3 second holds   Other Supine Lumbar Exercises Pelvic tilts x10 after PT demo   Knee/Hip Exercises: Aerobic   Nustep Level 3, 10 minutes   Moist Heat Therapy   Number Minutes Moist Heat 15 Minutes   Moist Heat Location Lumbar Spine  and hips in supine during exercises                        Long Term Clinic Goals - 10/20/15 1413    CC Long Term Goal  #1   Title Pt. will report at least 60% decrease in right wrist pain.   Status Achieved   CC Long Term Goal  #2   Title Pt. will show strength at 5/5 at right wrist, and without pain on manual muscle testing.   Status Achieved   CC Long Term Goal  #3   Title Right wrist flexion to at least 60 degrees.   Status Achieved   CC Long Term Goal  #4   Title Reduce quick DASH score to less than 20, showing improved function.   Baseline 20.45 on 09/24/2015  18.18 on 10/20/15   Status Achieved   CC Long Term Goal  #5   Title increase score on BERG to 46 demonstrating decreased risk for falls   Status On-going   CC Long Term Goal  #6   Title pt will report right hip pain is decreased by 50%   Time 4   Period Weeks   Status New   Additional Goals   Additional Goals Yes  Plan - 10/27/15 1331    Clinical Impression Statement Patient is limited with exercises due to pain but gives good effort.  She had some relief of pain doing knee to chest and other flexion activities.  Discussed use of heat for low back at home and various ways to  use it.  Patient verbalized understanding.   Pt will benefit from skilled therapeutic intervention in order to improve on the following deficits Pain;Decreased strength;Abnormal gait;Difficulty walking;Decreased balance   Rehab Potential Good   Clinical Impairments Affecting Rehab Potential radiation therapy, hx of fibromyalgia and chronic wrist  pain; has been experiencing Rthip pain which limits her standing/sitting activities, sees her PCP for this week of 10/20/15   PT Frequency 2x / week   PT Duration 4 weeks   PT Treatment/Interventions Passive range of motion;Therapeutic exercise;Manual techniques;Patient/family education;ADLs/Self Care Home Management;Balance training;Gait training;Stair training;Therapeutic activities;Neuromuscular re-education;Functional mobility training;DME Instruction;Moist Heat   PT Next Visit Plan Continue hip and lumbar ROM exercises as tolerated.   Consulted and Agree with Plan of Care Patient        Problem List Patient Active Problem List   Diagnosis Date Noted  . Back pain 10/22/2015  . Elevated liver function tests 10/22/2015  . Hyperlipidemia 08/13/2015  . Breast cancer of lower-outer quadrant of right female breast ([place]) 02/13/2015  . Vitamin D deficiency 02/06/2015  . Acute gouty arthritis 07/26/2014  . Type 2 diabetes mellitus with diabetic foot deformity ([place]) 01/01/2014  . Systolic CHF, chronic ([place]) 02/26/2013  . OBESITY 02/05/2009  . ASTHMA, PERSISTENT, MILD 02/08/2008  . Depression 05/18/2007  . HYPERTENSION, BENIGN SYSTEMIC 01/26/2007  . NEPHROLITHIASIS 01/26/2007  . DJD (degenerative joint disease), multiple sites 01/26/2007    Annia Friendly, PT 10/27/2015 2:31 PM  [place], [place], [address] Phone: [phone]   Fax:  [phone]  Name: [name] MRN: [id number] Date of Birth: [birth year]

## 2015-10-30 ENCOUNTER — Ambulatory Visit: Payer: Medicare Other | Attending: Radiation Oncology

## 2015-10-30 DIAGNOSIS — M25631 Stiffness of right wrist, not elsewhere classified: Secondary | ICD-10-CM | POA: Insufficient documentation

## 2015-10-30 DIAGNOSIS — R531 Weakness: Secondary | ICD-10-CM

## 2015-10-30 DIAGNOSIS — Z9181 History of falling: Secondary | ICD-10-CM

## 2015-10-30 DIAGNOSIS — M25551 Pain in right hip: Secondary | ICD-10-CM | POA: Insufficient documentation

## 2015-10-30 NOTE — Therapy (Signed)
[place], [place], [address] Phone: [phone]   Fax:  [phone]  Physical Therapy Treatment  Patient Details  Name: [name] MRN: [id number] Date of Birth: 08-Oct-1951 Referring Provider: Thea Silversmith  Encounter Date: 10/30/2015      PT End of Session - 10/30/15 1115    Visit Number 12   Number of Visits 17   Date for PT Re-Evaluation 11/21/15   PT Start Time [id number]   PT Stop Time 1116   PT Time Calculation (min) 53 min   Activity Tolerance Patient limited by pain   Behavior During Therapy [facility] for tasks assessed/performed      Past Medical History  Diagnosis Date  . Hypertension   . Diabetes mellitus   . Depression   . Asthma   . Eczema   . Arthritis   . Cataract   . GERD (gastroesophageal reflux disease)   . History of kidney stones     w/ hx of hydronephrosis - followed by [facility]  . Anemia   . Obesity   . HIV nonspecific serology     2006: indeterminate HIV blood test, seen by ID, felt secondary to cross reacting antibodies with no further workup felt necessary at that time   . Fatty liver     Fatty infiltration of liver noted on 03/2012 CT scan  . CHF, acute ([place]) 05/03/2012  . Breast cancer of lower-outer quadrant of right female breast ([place]) 02/13/2015  . Radiation 05/07/15-06/23/15    Right Breast    Past Surgical History  Procedure Laterality Date  . Cystoscopy w/ litholapaxy / ehl    . Cholecystectomy  2003  . Replacement total knee bilateral  2005 &2006  . Joint replacement      bilateral knee replacement  . Vascular surgery Right 03/15/2013    Ultrasound guided sclerotherapy  . Left and right heart catheterization with coronary angiogram N/A 05/02/2012    Procedure: LEFT AND RIGHT HEART CATHETERIZATION WITH CORONARY ANGIOGRAM;  Surgeon: Burnell Blanks, MD;  Location: [facility] CATH LAB;  Service: Cardiovascular;  Laterality: N/A;  . Radioactive seed guided  mastectomy with axillary sentinel lymph node biopsy Right 03/21/2015    Procedure: RIGHT  PARTIAL MASTECTOMY WITH RADIOACTIVE SEED LOCALIZATION RIGHT  AXILLARY SENTINEL LYMPH NODE BIOPSY;  Surgeon: Fanny Skates, MD;  Location: [place];  Service: General;  Laterality: Right;    There were no vitals filed for this visit.  Visit Diagnosis:  Generalized weakness  Risk for falls  Right hip pain      Subjective Assessment - 10/30/15 1030    Subjective My Rt hip is still really hurting, the results from my xray showed that it's really bad arthritis.   Currently in Pain? Yes   Pain Score 7    Pain Location Hip   Pain Orientation Right;Posterior   Pain Descriptors / Indicators Sharp;Constant   Pain Type Chronic pain   Pain Onset More than a month ago   Pain Frequency Constant   Aggravating Factors  sit to stand    Pain Relieving Factors nothing really helps, the heat has been as effective lately                         [facility] Adult PT Treatment/Exercise - 10/30/15 0001    Lumbar Exercises: Stretches   Passive Hamstring Stretch 3 reps;20 seconds  Rt LE only by therapist   Single Knee  to Chest Stretch 3 reps;20 seconds  With towel; Bil LE   Piriformis Stretch 3 reps;20 seconds  Passively by therapist, Rt LE only   Lumbar Exercises: Supine   Bridge 10 reps   Straight Leg Raise 10 reps  Bil LE   Straight Leg Raises Limitations Pt got a cramp in Rt inner thigh after 7 reps on Lt LE but was able to continue after passive stretching.   Knee/Hip Exercises: Aerobic   Nustep Level 3, 10 minutes  Therapist present to monitor pain.    Knee/Hip Exercises: Seated   Long Arc Quad Strengthening;Both;10 reps;Weights  5 second holds   Long [employer] Weight 1 lbs.   Ball Squeeze 2 sets of 10 reps   Clamshell with TheraBand Red  2 sets of 10 reps   Other Seated Knee/Hip Exercises Tried seated Rt hamstring stretch but too much Rt posterior hip pain so stopped    Marching Strengthening;Both;10 reps;Weights   Marching Weights 1 lbs.   Knee/Hip Exercises: Sidelying   Hip ABduction Strengthening;Both;10 reps   Hip ABduction Limitations Slight increase in pain with Rt LE.                        [facility] Goals - 10/30/15 1224    CC Long Term Goal  #5   Title increase score on BERG to 46 demonstrating decreased risk for falls   Status On-going   CC Long Term Goal  #6   Title pt will report right hip pain is decreased by 50%   Status On-going            Plan - 10/30/15 1116    Clinical Impression Statement Pt still limited with tolerance to certain exercises but continues to give good effort. [name] reported some relief during last session but slight increase in pain after. She is going to try to continue with stetches we have done here last 2 sessions at home and to try to just keep moving.    Pt will benefit from skilled therapeutic intervention in order to improve on the following deficits Pain;Decreased strength;Abnormal gait;Difficulty walking;Decreased balance   Rehab Potential Good   Clinical Impairments Affecting Rehab Potential radiation therapy, hx of fibromyalgia and chronic wrist  pain; has been experiencing Rthip pain which limits her standing/sitting activities, sees her PCP for this week of 10/20/15   PT Frequency 2x / week   PT Duration 4 weeks   PT Treatment/Interventions Passive range of motion;Therapeutic exercise;Manual techniques;Patient/family education;ADLs/Self Care Home Management;Balance training;Gait training;Stair training;Therapeutic activities;Neuromuscular re-education;Functional mobility training;DME Instruction;Moist Heat   PT Next Visit Plan Continue hip and lumbar ROM exercises as tolerated.   Consulted and Agree with Plan of Care Patient        Problem List Patient Active Problem List   Diagnosis Date Noted  . Back pain 10/22/2015  . Elevated liver function tests 10/22/2015  .  Hyperlipidemia 08/13/2015  . Breast cancer of lower-outer quadrant of right female breast ([place]) 02/13/2015  . Vitamin D deficiency 02/06/2015  . Acute gouty arthritis 07/26/2014  . Type 2 diabetes mellitus with diabetic foot deformity ([place]) 01/01/2014  . Systolic CHF, chronic ([place]) 02/26/2013  . OBESITY 02/05/2009  . ASTHMA, PERSISTENT, MILD 02/08/2008  . Depression 05/18/2007  . HYPERTENSION, BENIGN SYSTEMIC 01/26/2007  . NEPHROLITHIASIS 01/26/2007  . DJD (degenerative joint disease), multiple sites 01/26/2007    Otelia Limes, PTA 10/30/2015, 12:25 PM  [place] [phone]  [place], [place], [address] Phone: [phone]   Fax:  [phone]  Name: [name] MRN: [id number] Date of Birth: Apr 05, 1951

## 2015-11-04 ENCOUNTER — Ambulatory Visit: Payer: Medicare Other

## 2015-11-04 DIAGNOSIS — Z9181 History of falling: Secondary | ICD-10-CM | POA: Diagnosis not present

## 2015-11-04 DIAGNOSIS — M25551 Pain in right hip: Secondary | ICD-10-CM

## 2015-11-04 DIAGNOSIS — M25631 Stiffness of right wrist, not elsewhere classified: Secondary | ICD-10-CM | POA: Diagnosis not present

## 2015-11-04 DIAGNOSIS — R531 Weakness: Secondary | ICD-10-CM | POA: Diagnosis not present

## 2015-11-04 NOTE — Therapy (Signed)
[place], [place], [address] Phone: [phone]   Fax:  [phone]  Physical Therapy Treatment  Patient Details  Name: [name] MRN: [id number] Date of Birth: Mar 29, 1951 Referring Provider: Thea Silversmith  Encounter Date: 11/04/2015      PT End of Session - 11/04/15 1150    Visit Number 13   Number of Visits 17   Date for PT Re-Evaluation 11/21/15   PT Start Time 1103   PT Stop Time 1134  Ended early due to pts request from increase pain   PT Time Calculation (min) 31 min   Activity Tolerance Patient limited by pain   Behavior During Therapy Restless  Pt in alot of pain during session today      Past Medical History  Diagnosis Date  . Hypertension   . Diabetes mellitus   . Depression   . Asthma   . Eczema   . Arthritis   . Cataract   . GERD (gastroesophageal reflux disease)   . History of kidney stones     w/ hx of hydronephrosis - followed by [facility]  . Anemia   . Obesity   . HIV nonspecific serology     2006: indeterminate HIV blood test, seen by ID, felt secondary to cross reacting antibodies with no further workup felt necessary at that time   . Fatty liver     Fatty infiltration of liver noted on 03/2012 CT scan  . CHF, acute ([place]) 05/03/2012  . Breast cancer of lower-outer quadrant of right female breast ([name]) 02/13/2015  . Radiation 05/07/15-06/23/15    Right Breast    Past Surgical History  Procedure Laterality Date  . Cystoscopy w/ litholapaxy / ehl    . Cholecystectomy  2003  . Replacement total knee bilateral  2005 &2006  . Joint replacement      bilateral knee replacement  . Vascular surgery Right 03/15/2013    Ultrasound guided sclerotherapy  . Left and right heart catheterization with coronary angiogram N/A 05/02/2012    Procedure: LEFT AND RIGHT HEART CATHETERIZATION WITH CORONARY ANGIOGRAM;  Surgeon: Burnell Blanks, MD;  Location: [facility] CATH LAB;   Service: Cardiovascular;  Laterality: N/A;  . Radioactive seed guided mastectomy with axillary sentinel lymph node biopsy Right 03/21/2015    Procedure: RIGHT  PARTIAL MASTECTOMY WITH RADIOACTIVE SEED LOCALIZATION RIGHT  AXILLARY SENTINEL LYMPH NODE BIOPSY;  Surgeon: Fanny Skates, MD;  Location: [place];  Service: General;  Laterality: Right;    There were no vitals filed for this visit.  Visit Diagnosis:  Generalized weakness  Risk for falls  Right hip pain  Stiffness of joint, forearm, right      Subjective Assessment - 11/04/15 1113    Subjective My Rt hip is still bothering me alot but I felt okay after last visit, I wasn't worse!   Currently in Pain? Yes   Pain Score 7    Pain Location Hip   Pain Orientation Right;Lateral   Pain Descriptors / Indicators Jabbing;Constant   Pain Onset More than a month ago   Pain Frequency Constant   Aggravating Factors  sit to stand is worse, and prolonged sitting   Pain Relieving Factors the stetches last time helped a little, but not for  long                         [facility] Adult PT Treatment/Exercise - 11/04/15 0001  Lumbar Exercises: Stretches   Passive Hamstring Stretch 3 reps;20 seconds  Bil LE; pt using gait belt   Single Knee to Chest Stretch 3 reps;20 seconds  Bil LE's; Pt using gait belt   Lower Trunk Rotation 10 seconds  multiple reps throughout session   Lumbar Exercises: Supine   Bridge 10 reps  2 sets   Bridge Limitations Cuing for slow and controlled movements   Straight Leg Raise 10 reps  2 sets Bil LE's   Knee/Hip Exercises: Standing   Hip Flexion Stengthening;Both;10 reps;Knee straight  +2 HHA at counter   Hip Flexion Limitations Standing at counter, verbal cuing to slow down   Hip Abduction Stengthening;Both;10 reps  +2 HHA at counter   Abduction Limitations Needed to sit after due to LE's feeling shaky   Hip Extension Stengthening;Both;10 reps  +2 HHA at counter    Knee/Hip Exercises: Sidelying   Clams Bil 2 sets of 10 reps each                        Long Term Clinic Goals - 10/30/15 1224    CC Long Term Goal  #5   Title increase score on BERG to 46 demonstrating decreased risk for falls   Status On-going   CC Long Term Goal  #6   Title pt will report right hip pain is decreased by 50%   Status On-going            Plan - 11/04/15 1152    Clinical Impression Statement Pt reported pain about same as last visit though it increased throughout session to the point that pt wanted to end session early. She reports will probably contact doctor again and therapist verbalized this a good idea. Pts Rt LE was shaky intermittently during treatment when pt was doing standing activities and had min LOB when leaving clinic that she was able to self corret though therapist was holding her arm  already for safety ue to her feeling shaky.    Pt will benefit from skilled therapeutic intervention in order to improve on the following deficits Pain;Decreased strength;Abnormal gait;Difficulty walking;Decreased balance   Rehab Potential Good   Clinical Impairments Affecting Rehab Potential radiation therapy, hx of fibromyalgia and chronic wrist  pain; has been experiencing Rthip pain which limits her standing/sitting activities, sees her PCP for this week of 10/20/15   PT Frequency 2x / week   PT Duration 4 weeks   PT Next Visit Plan Continue hip and lumbar ROM exercises as tolerated. Ask PT if okay to add pain management technique trials of IFC or soft tissue to Rt hip???   Recommended Other Services Pt to possibly call doctor again regarding her consistent Rt hip pain   Consulted and Agree with Plan of Care Patient        Problem List Patient Active Problem List   Diagnosis Date Noted  . Back pain 10/22/2015  . Elevated liver function tests 10/22/2015  . Hyperlipidemia 08/13/2015  . Breast cancer of lower-outer quadrant of right female breast  ([name]) 02/13/2015  . Vitamin D deficiency 02/06/2015  . Acute gouty arthritis 07/26/2014  . Type 2 diabetes mellitus with diabetic foot deformity ([name]) 01/01/2014  . Systolic CHF, chronic ([name]) 02/26/2013  . OBESITY 02/05/2009  . ASTHMA, PERSISTENT, MILD 02/08/2008  . Depression 05/18/2007  . HYPERTENSION, BENIGN SYSTEMIC 01/26/2007  . NEPHROLITHIASIS 01/26/2007  . DJD (degenerative joint disease), multiple sites 01/26/2007    Otelia Limes, PTA  11/04/2015, 12:18 PM  [place], [place], [address] Phone: [phone]   Fax:  [phone]  Name: PHANTASIA DAJANI MRN: [id number] Date of Birth: 1950/12/23

## 2015-11-05 NOTE — Addendum Note (Signed)
Addended by: Ar[place]us Ped C on: 11/05/2015 09:04 AM   Modules accepted: Orders

## 2015-11-06 ENCOUNTER — Ambulatory Visit: Payer: Medicare Other

## 2015-11-06 DIAGNOSIS — M25551 Pain in right hip: Secondary | ICD-10-CM | POA: Diagnosis not present

## 2015-11-06 DIAGNOSIS — Z9181 History of falling: Secondary | ICD-10-CM

## 2015-11-06 DIAGNOSIS — R531 Weakness: Secondary | ICD-10-CM

## 2015-11-06 DIAGNOSIS — M25631 Stiffness of right wrist, not elsewhere classified: Secondary | ICD-10-CM | POA: Diagnosis not present

## 2015-11-06 NOTE — Therapy (Signed)
[place], [place], [address] Phone: [phone]   Fax:  [phone]  Physical Therapy Treatment  Patient Details  Name: [name] MRN: [id number] Date of Birth: 08/08/1951 Referring Provider: Thea Silversmith  Encounter Date: 11/06/2015      PT End of Session - 11/06/15 1349    Visit Number 14   Number of Visits 17   Date for PT Re-Evaluation 11/21/15   PT Start Time 9357   PT Stop Time 1345   PT Time Calculation (min) 49 min   Activity Tolerance Patient limited by pain;Patient tolerated treatment well   Behavior During Therapy [facility] for tasks assessed/performed      Past Medical History  Diagnosis Date  . Hypertension   . Diabetes mellitus   . Depression   . Asthma   . Eczema   . Arthritis   . Cataract   . GERD (gastroesophageal reflux disease)   . History of kidney stones     w/ hx of hydronephrosis - followed by [facility]  . Anemia   . Obesity   . HIV nonspecific serology     2006: indeterminate HIV blood test, seen by ID, felt secondary to cross reacting antibodies with no further workup felt necessary at that time   . Fatty liver     Fatty infiltration of liver noted on 03/2012 CT scan  . CHF, acute ([place]) 05/03/2012  . Breast cancer of lower-outer quadrant of right female breast ([place]) 02/13/2015  . Radiation 05/07/15-06/23/15    Right Breast    Past Surgical History  Procedure Laterality Date  . Cystoscopy w/ litholapaxy / ehl    . Cholecystectomy  2003  . Replacement total knee bilateral  2005 &2006  . Joint replacement      bilateral knee replacement  . Vascular surgery Right 03/15/2013    Ultrasound guided sclerotherapy  . Left and right heart catheterization with coronary angiogram N/A 05/02/2012    Procedure: LEFT AND RIGHT HEART CATHETERIZATION WITH CORONARY ANGIOGRAM;  Surgeon: Burnell Blanks, MD;  Location: [facility] CATH LAB;  Service: Cardiovascular;  Laterality:  N/A;  . Radioactive seed guided mastectomy with axillary sentinel lymph node biopsy Right 03/21/2015    Procedure: RIGHT  PARTIAL MASTECTOMY WITH RADIOACTIVE SEED LOCALIZATION RIGHT  AXILLARY SENTINEL LYMPH NODE BIOPSY;  Surgeon: Fanny Skates, MD;  Location: [place];  Service: General;  Laterality: Right;    There were no vitals filed for this visit.  Visit Diagnosis:  Generalized weakness  Risk for falls  Right hip pain      Subjective Assessment - 11/06/15 1305    Subjective My Rt hip actually felt pretty good this morning when I woke up and that was before I had my pain pills! Not sure why it felt so good, but it was down to a 4/10 for about an hour. Back up to what it was last session though and it's moving around my hip.   Currently in Pain? Yes   Pain Score 7    Pain Location Hip   Pain Orientation Right;Anterior   Pain Descriptors / Indicators Jabbing   Pain Onset More than a month ago   Aggravating Factors  sit to stand, prolonged sitting   Pain Relieving Factors pain meds briefly, but overall nothing much                         [facility] Adult PT Treatment/Exercise -  11/06/15 0001    Lumbar Exercises: Stretches   Single Knee to Chest Stretch 3 reps;20 seconds  Bil LE's using towel behind knee   Lower Trunk Rotation 3 reps;20 seconds   Lower Trunk Rotation Limitations To Lt only due to pain when rotating to Rt    Lumbar Exercises: Supine   Ab Set 10 reps;3 seconds  Pelvic tilt   Clam Other (comment)  3 sets of 10 reps with pelvic tilt   Knee/Hip Exercises: Aerobic   Nustep Level 4, 10 minutes  Therapist present to monitor pts pain   Knee/Hip Exercises: Standing   Hip Flexion Stengthening;Both;10 reps  +2 HHA at counter   Hip Abduction Stengthening;Both;10 reps  +2 HHA at counter    Hip Extension Stengthening;Both;10 reps  +2 HHA at counter   Lateral Step Up Both;10 reps;Hand Hold: 2;Step Height: 4"   Forward Step Up Both;10  reps;Hand Hold: 2;Step Height: 4"   Wall Squat 2 sets;5 reps;3 seconds   Knee/Hip Exercises: Seated   Long Arc Quad Strengthening;Both;2 sets;10 reps;Weights   Long Arc Quad Weight 2 lbs.   Other Seated Knee/Hip Exercises Tried seated Rt hamstring stretch but too much Rt posterior hip pain so stopped                        [facility] Term Clinic Goals - 11/06/15 1311    CC Long Term Goal  #1   Title Pt. will report at least 60% decrease in right wrist pain.   Status Achieved   CC Long Term Goal  #2   Title Pt. will show strength at 5/5 at right wrist, and without pain on manual muscle testing.   Status Achieved   CC Long Term Goal  #3   Title Right wrist flexion to at least 60 degrees.   Status Achieved   CC Long Term Goal  #4   Title Reduce quick DASH score to less than 20, showing improved function.   Status Achieved   CC Long Term Goal  #5   Title increase score on BERG to 46 demonstrating decreased risk for falls   Status On-going   CC Long Term Goal  #6   Title pt will report right hip pain is decreased by 50%  Pt still in flare up of Rt hip pain that she has been experiencing for weeks.    Status Not Met            Plan - 11/06/15 1312    Clinical Impression Statement Pt able to tolerate 10 minutes on the NuStep today without increase pain and her report of pain was decreased to 6/10 after session!! [name], PT sent recert to [name] after last session requesting we add IFC and manual ltherapy to the [place] and if that doesn't allow for decrease in pain in 2 weeks to discharge her at that time. Recert hasn't been signed yet so hopefully by next visit and start 2 weeks from that point.     Pt will benefit from skilled therapeutic intervention in order to improve on the following deficits Pain;Decreased strength;Abnormal gait;Difficulty walking;Decreased balance   Rehab Potential Good   Clinical Impairments Affecting Rehab Potential radiation therapy, hx  of fibromyalgia and chronic wrist  pain; has been experiencing Rthip pain which limits her standing/sitting activities, sees her PCP for this week of 10/20/15   PT Frequency 2x / week   PT Duration 4 weeks   PT Treatment/Interventions Passive  range of motion;Therapeutic exercise;Manual techniques;Patient/family education;ADLs/Self Care Home Management;Balance training;Gait training;Stair training;Therapeutic activities;Neuromuscular re-education;Functional mobility training;DME Instruction;Moist Heat   PT Next Visit Plan Continue hip and lumbar ROM exercises as tolerated. Incorporate IFC and manual therapy to Rt hip when recert signed. Try warming up with NuStep again, then standing activites and finish with supine.   Consulted and Agree with Plan of Care Patient        Problem List Patient Active Problem List   Diagnosis Date Noted  . Back pain 10/22/2015  . Elevated liver function tests 10/22/2015  . Hyperlipidemia 08/13/2015  . Breast cancer of lower-outer quadrant of right female breast ([place]) 02/13/2015  . Vitamin D deficiency 02/06/2015  . Acute gouty arthritis 07/26/2014  . Type 2 diabetes mellitus with diabetic foot deformity ([place]) 01/01/2014  . Systolic CHF, chronic ([place]) 02/26/2013  . OBESITY 02/05/2009  . ASTHMA, PERSISTENT, MILD 02/08/2008  . Depression 05/18/2007  . HYPERTENSION, BENIGN SYSTEMIC 01/26/2007  . NEPHROLITHIASIS 01/26/2007  . DJD (degenerative joint disease), multiple sites 01/26/2007    Otelia Limes, PTA 11/06/2015, 1:53 PM  [place], [place], [address] Phone: [phone]   Fax:  [phone]  Name: [name] MRN: [id number] Date of Birth: Jan 20, 1951

## 2015-11-11 ENCOUNTER — Ambulatory Visit: Payer: Medicare Other

## 2015-11-11 DIAGNOSIS — M25551 Pain in right hip: Secondary | ICD-10-CM

## 2015-11-11 DIAGNOSIS — R531 Weakness: Secondary | ICD-10-CM

## 2015-11-11 DIAGNOSIS — Z9181 History of falling: Secondary | ICD-10-CM | POA: Diagnosis not present

## 2015-11-11 DIAGNOSIS — M25631 Stiffness of right wrist, not elsewhere classified: Secondary | ICD-10-CM | POA: Diagnosis not present

## 2015-11-11 NOTE — Therapy (Signed)
[name], [place], [address] Phone: [phone]   Fax:  [phone]  Physical Therapy Treatment  Patient Details  Name: [name] MRN: [id number] Date of Birth: 1951-11-05 Referring Provider: Thea Silversmith  Encounter Date: 11/11/2015      PT End of Session - 11/11/15 1106    Visit Number 15   Number of Visits 17   Date for PT Re-Evaluation 11/21/15   PT Start Time 1024   PT Stop Time 5927   PT Time Calculation (min) 54 min   Activity Tolerance Patient limited by pain;Patient tolerated treatment well   Behavior During Therapy [facility] for tasks assessed/performed      Past Medical History  Diagnosis Date  . Hypertension   . Diabetes mellitus   . Depression   . Asthma   . Eczema   . Arthritis   . Cataract   . GERD (gastroesophageal reflux disease)   . History of kidney stones     w/ hx of hydronephrosis - followed by [facility]  . Anemia   . Obesity   . HIV nonspecific serology     2006: indeterminate HIV blood test, seen by ID, felt secondary to cross reacting antibodies with no further workup felt necessary at that time   . Fatty liver     Fatty infiltration of liver noted on 03/2012 CT scan  . CHF, acute ([place]) 05/03/2012  . Breast cancer of lower-outer quadrant of right female breast ([place]) 02/13/2015  . Radiation 05/07/15-06/23/15    Right Breast    Past Surgical History  Procedure Laterality Date  . Cystoscopy w/ litholapaxy / ehl    . Cholecystectomy  2003  . Replacement total knee bilateral  2005 &2006  . Joint replacement      bilateral knee replacement  . Vascular surgery Right 03/15/2013    Ultrasound guided sclerotherapy  . Left and right heart catheterization with coronary angiogram N/A 05/02/2012    Procedure: LEFT AND RIGHT HEART CATHETERIZATION WITH CORONARY ANGIOGRAM;  Surgeon: Burnell Blanks, MD;  Location: [facility] CATH LAB;  Service: Cardiovascular;  Laterality:  N/A;  . Radioactive seed guided mastectomy with axillary sentinel lymph node biopsy Right 03/21/2015    Procedure: RIGHT  PARTIAL MASTECTOMY WITH RADIOACTIVE SEED LOCALIZATION RIGHT  AXILLARY SENTINEL LYMPH NODE BIOPSY;  Surgeon: Fanny Skates, MD;  Location: [place];  Service: General;  Laterality: Right;    There were no vitals filed for this visit.  Visit Diagnosis:  Generalized weakness  Risk for falls  Right hip pain      Subjective Assessment - 11/11/15 1035    Subjective My Rt hip pain starts at the front of my hip today and wraps around to the top of my glut.    Currently in Pain? Yes   Pain Score 7    Pain Location Hip   Pain Orientation Right;Anterior;Posterior   Pain Descriptors / Proofreader;Shooting   Pain Type Chronic pain   Pain Radiating Towards from groin to posterior hip   Pain Onset More than a month ago   Pain Frequency Constant   Aggravating Factors  prolonged sitting or walking   Pain Relieving Factors not much, pain meds only help a little                         [facility] Adult PT Treatment/Exercise - 11/11/15 0001    Knee/Hip Exercises: Aerobic   Nustep  Level 3 x 8 minutes with pt present to monitor pts pain/tolerance   Knee/Hip Exercises: Standing   Hip Flexion Stengthening;Both;10 reps  +1 HHA at counter   Hip Abduction Stengthening;Both;10 reps  +2 HHA at counter   Hip Extension Stengthening;Both;10 reps  +2 HHA at counter   Extension Limitations Had min LOB into chair at end of set, this was last one done. Pt reports due to pain and weakness   Electrical Stimulation   Electrical Stimulation Location Rt hip in Lt S/L   Electrical Stimulation Action IFC   Electrical Stimulation Parameters 80-150Hz  x 15 minutes   Electrical Stimulation Goals Pain   Manual Therapy   Soft tissue mobilization In Lt S/L to Rt anterior, lateral and posterior hip                        Long Term Clinic Goals -  11/06/15 1311    CC Long Term Goal  #1   Title Pt. will report at least 60% decrease in right wrist pain.   Status Achieved   CC Long Term Goal  #2   Title Pt. will show strength at 5/5 at right wrist, and without pain on manual muscle testing.   Status Achieved   CC Long Term Goal  #3   Title Right wrist flexion to at least 60 degrees.   Status Achieved   CC Long Term Goal  #4   Title Reduce quick DASH score to less than 20, showing improved function.   Status Achieved   CC Long Term Goal  #5   Title increase score on BERG to 46 demonstrating decreased risk for falls   Status On-going   CC Long Term Goal  #6   Title pt will report right hip pain is decreased by 50%  Pt still in flare up of Rt hip pain that she has been experiencing for weeks.    Status Not Met            Plan - 11/11/15 1106    Clinical Impression Statement Pt had increased pain after standing hip 3 way raises but NuStep did not increase her pain. Recert was signed from [name] so began manual therapy today and IFC to Rt hip.    Pt will benefit from skilled therapeutic intervention in order to improve on the following deficits Pain;Decreased strength;Abnormal gait;Difficulty walking;Decreased balance   Rehab Potential Good   Clinical Impairments Affecting Rehab Potential radiation therapy, hx of fibromyalgia and chronic wrist  pain; has been experiencing Rthip pain which limits her standing/sitting activities, sees her PCP for this week of 10/20/15   PT Frequency 2x / week   PT Duration 4 weeks   PT Treatment/Interventions Passive range of motion;Therapeutic exercise;Manual techniques;Patient/family education;ADLs/Self Care Home Management;Balance training;Gait training;Stair training;Therapeutic activities;Neuromuscular re-education;Functional mobility training;DME Instruction;Moist Heat   PT Next Visit Plan Assess next visit/see if pt had any pain relief from todays manual therapy and IFC. Continue hip and  lumbar ROM exercises as tolerated. Incorporate IFC and manual therapy to Rt hip when recert signed. Try warming up with NuStep again, then standing activites and finish with supine.   Consulted and Agree with Plan of Care Patient        Problem List Patient Active Problem List   Diagnosis Date Noted  . Back pain 10/22/2015  . Elevated liver function tests 10/22/2015  . Hyperlipidemia 08/13/2015  . Breast cancer of lower-outer quadrant of right female breast ([place])  02/13/2015  . Vitamin D deficiency 02/06/2015  . Acute gouty arthritis 07/26/2014  . Type 2 diabetes mellitus with diabetic foot deformity ([place]) 01/01/2014  . Systolic CHF, chronic ([place]) 02/26/2013  . OBESITY 02/05/2009  . ASTHMA, PERSISTENT, MILD 02/08/2008  . Depression 05/18/2007  . HYPERTENSION, BENIGN SYSTEMIC 01/26/2007  . NEPHROLITHIASIS 01/26/2007  . DJD (degenerative joint disease), multiple sites 01/26/2007    Otelia Limes, PTA 11/11/2015, 11:09 AM  [place], [place], [address] Phone: [phone]   Fax:  [phone]  Name: [name] MRN: [id number] Date of Birth: [birth year]

## 2015-11-13 ENCOUNTER — Ambulatory Visit: Payer: Medicare Other

## 2015-11-13 DIAGNOSIS — R531 Weakness: Secondary | ICD-10-CM | POA: Diagnosis not present

## 2015-11-13 DIAGNOSIS — M25631 Stiffness of right wrist, not elsewhere classified: Secondary | ICD-10-CM | POA: Diagnosis not present

## 2015-11-13 DIAGNOSIS — M25551 Pain in right hip: Secondary | ICD-10-CM | POA: Diagnosis not present

## 2015-11-13 DIAGNOSIS — Z9181 History of falling: Secondary | ICD-10-CM

## 2015-11-13 NOTE — Therapy (Signed)
[place], [place], [address] Phone: [phone]   Fax:  [phone]  Physical Therapy Treatment  Patient Details  Name: [name] MRN: [id number] Date of Birth: 03-25-1951 Referring Provider: Thea Silversmith  Encounter Date: 11/13/2015      PT End of Session - 11/13/15 1101    Visit Number 16   Number of Visits 17   Date for PT Re-Evaluation 11/21/15   PT Start Time 1016   PT Stop Time 1114   PT Time Calculation (min) 58 min   Activity Tolerance Patient tolerated treatment well   Behavior During Therapy [facility] for tasks assessed/performed      Past Medical History  Diagnosis Date  . Hypertension   . Diabetes mellitus   . Depression   . Asthma   . Eczema   . Arthritis   . Cataract   . GERD (gastroesophageal reflux disease)   . History of kidney stones     w/ hx of hydronephrosis - followed by [facility]  . Anemia   . Obesity   . HIV nonspecific serology     2006: indeterminate HIV blood test, seen by ID, felt secondary to cross reacting antibodies with no further workup felt necessary at that time   . Fatty liver     Fatty infiltration of liver noted on 03/2012 CT scan  . CHF, acute ([place]) 05/03/2012  . Breast cancer of lower-outer quadrant of right female breast ([place]) 02/13/2015  . Radiation 05/07/15-06/23/15    Right Breast    Past Surgical History  Procedure Laterality Date  . Cystoscopy w/ litholapaxy / ehl    . Cholecystectomy  2003  . Replacement total knee bilateral  2005 &2006  . Joint replacement      bilateral knee replacement  . Vascular surgery Right 03/15/2013    Ultrasound guided sclerotherapy  . Left and right heart catheterization with coronary angiogram N/A 05/02/2012    Procedure: LEFT AND RIGHT HEART CATHETERIZATION WITH CORONARY ANGIOGRAM;  Surgeon: Burnell Blanks, MD;  Location: [facility] CATH LAB;  Service: Cardiovascular;  Laterality: N/A;  . Radioactive  seed guided mastectomy with axillary sentinel lymph node biopsy Right 03/21/2015    Procedure: RIGHT  PARTIAL MASTECTOMY WITH RADIOACTIVE SEED LOCALIZATION RIGHT  AXILLARY SENTINEL LYMPH NODE BIOPSY;  Surgeon: Fanny Skates, MD;  Location: [place];  Service: General;  Laterality: Right;    There were no vitals filed for this visit.  Visit Diagnosis:  Generalized weakness  Risk for falls  Right hip pain      Subjective Assessment - 11/13/15 1026    Subjective My Rt hip was hurting really bad yesterday, but today it is much better! Was able to get dressed and get out of the car without it increasing.    Currently in Pain? Yes   Pain Score 5    Pain Location Hip   Pain Orientation Right;Anterior   Pain Descriptors / Indicators Dull   Pain Type Chronic pain   Aggravating Factors  prolonged sitting/walking; sit to tstand   Pain Relieving Factors what we did last time seemed to eventually help                         [facility] Adult PT Treatment/Exercise - 11/13/15 0001    Lumbar Exercises: Stretches   Single Knee to Chest Stretch 3 reps;20 seconds  Bil LE's using towel behind knee   Lower Trunk  Rotation 3 reps;20 seconds  Bil side, no pain with these today   Knee/Hip Exercises: Aerobic   Nustep Level 4 x10 minutes with therapist present to monitor pt   Knee/Hip Exercises: Standing   Hip Flexion Stengthening;Both;10 reps  +1 HHA at counter   Hip Abduction Stengthening;Both;10 reps  +2 HHA at counter   Abduction Limitations Cuing fo rslow and controlled movements   Hip Extension Stengthening;Both;10 reps  +2 HHA at counter   Extension Limitations Cuing for slow, controlled pace but no increase pain with these today   Knee/Hip Exercises: Seated   Other Seated Knee/Hip Exercises Tried seated Rt hamstring stretch but still with Rt posterior hip pain so stopped   Knee/Hip Exercises: Sidelying   Clams Laying on Lt  for Rt hip 2 sets of 10 reps    Electrical Stimulation   Electrical Stimulation Location Rt hip in Lt S/L   Electrical Stimulation Action IFC   Electrical Stimulation Parameters 80-150 Hz x 15 minutes   Electrical Stimulation Goals Pain   Manual Therapy   Soft tissue mobilization In Lt S/L to Rt anterior, lateral and posterior hip   Passive ROM During soft tissue work into gentle Rt hip extension 3 reps with long holds                        [place] Goals - 11/13/15 1104    CC Long Term Goal  #5   Title increase score on BERG to 46 demonstrating decreased risk for falls   Status On-going   CC Long Term Goal  #6   Title pt will report right hip pain is decreased by 50%  Pts pain was decreased toa 5/10 today. This is the lowest it has been in weeks. 11/13/15   Status On-going            Plan - 11/13/15 1101    Clinical Impression Statement Pt did very well with all exercises today except pain still with seated Rt hamstring attempt but able to stretch in supine instead. Continued with manual therapy and IFC as pt had less pain today and after Nustep was even ambulating without her cane and no antalgic gait.    Pt will benefit from skilled therapeutic intervention in order to improve on the following deficits Pain;Decreased strength;Abnormal gait;Difficulty walking;Decreased balance   Rehab Potential Good   Clinical Impairments Affecting Rehab Potential radiation therapy, hx of fibromyalgia and chronic wrist  pain; has been experiencing Rthip pain which limits her standing/sitting activities, sees her PCP for this week of 10/20/15   PT Frequency 2x / week   PT Duration 4 weeks   PT Treatment/Interventions Passive range of motion;Therapeutic exercise;Manual techniques;Patient/family education;ADLs/Self Care Home Management;Balance training;Gait training;Stair training;Therapeutic activities;Neuromuscular re-education;Functional mobility training;DME Instruction;Moist Heat   PT Next Visit Plan  Add standing hip flexion stretch (maybe in lunge position at counter) if tolerable. Continue hip and lumbar ROM exercises as tolerated. Continue IFC and manual therapy to Rt hip.   Consulted and Agree with Plan of Care Patient        Problem List Patient Active Problem List   Diagnosis Date Noted  . Back pain 10/22/2015  . Elevated liver function tests 10/22/2015  . Hyperlipidemia 08/13/2015  . Breast cancer of lower-outer quadrant of right female breast ([place]) 02/13/2015  . Vitamin D deficiency 02/06/2015  . Acute gouty arthritis 07/26/2014  . Type 2 diabetes mellitus with diabetic foot deformity ([place]) 01/01/2014  .  Systolic CHF, chronic ([place]) 02/26/2013  . OBESITY 02/05/2009  . ASTHMA, PERSISTENT, MILD 02/08/2008  . Depression 05/18/2007  . HYPERTENSION, BENIGN SYSTEMIC 01/26/2007  . NEPHROLITHIASIS 01/26/2007  . DJD (degenerative joint disease), multiple sites 01/26/2007    Otelia Limes, PTA 11/13/2015, 11:06 AM  [place], [place], [address] Phone: [phone]   Fax:  [phone]  Name: [name] MRN: [id number] Date of Birth: Apr 19, 1951

## 2015-11-18 ENCOUNTER — Other Ambulatory Visit: Payer: Self-pay | Admitting: Family Medicine

## 2015-11-18 ENCOUNTER — Ambulatory Visit: Payer: Medicare Other

## 2015-11-18 DIAGNOSIS — R531 Weakness: Secondary | ICD-10-CM

## 2015-11-18 DIAGNOSIS — M25631 Stiffness of right wrist, not elsewhere classified: Secondary | ICD-10-CM | POA: Diagnosis not present

## 2015-11-18 DIAGNOSIS — Z9181 History of falling: Secondary | ICD-10-CM | POA: Diagnosis not present

## 2015-11-18 DIAGNOSIS — M25551 Pain in right hip: Secondary | ICD-10-CM | POA: Diagnosis not present

## 2015-11-18 NOTE — Therapy (Signed)
[place], [place], [address] Phone: [phone]   Fax:  [phone]  Physical Therapy Treatment  Patient Details  Name: [name] MRN: [id number] Date of Birth: 14-May-1951 Referring Provider: Thea Silversmith  Encounter Date: 11/18/2015      PT End of Session - 11/18/15 1150    Visit Number 17   Number of Visits 17   Date for PT Re-Evaluation 11/21/15   PT Start Time 1107   PT Stop Time 1202   PT Time Calculation (min) 55 min   Activity Tolerance Patient limited by pain   Behavior During Therapy [facility] for tasks assessed/performed      Past Medical History  Diagnosis Date  . Hypertension   . Diabetes mellitus   . Depression   . Asthma   . Eczema   . Arthritis   . Cataract   . GERD (gastroesophageal reflux disease)   . History of kidney stones     w/ hx of hydronephrosis - followed by [facility]  . Anemia   . Obesity   . HIV nonspecific serology     2006: indeterminate HIV blood test, seen by ID, felt secondary to cross reacting antibodies with no further workup felt necessary at that time   . Fatty liver     Fatty infiltration of liver noted on 03/2012 CT scan  . CHF, acute ([place]) 05/03/2012  . Breast cancer of lower-outer quadrant of right female breast ([place]) 02/13/2015  . Radiation 05/07/15-06/23/15    Right Breast    Past Surgical History  Procedure Laterality Date  . Cystoscopy w/ litholapaxy / ehl    . Cholecystectomy  2003  . Replacement total knee bilateral  2005 &2006  . Joint replacement      bilateral knee replacement  . Vascular surgery Right 03/15/2013    Ultrasound guided sclerotherapy  . Left and right heart catheterization with coronary angiogram N/A 05/02/2012    Procedure: LEFT AND RIGHT HEART CATHETERIZATION WITH CORONARY ANGIOGRAM;  Surgeon: Burnell Blanks, MD;  Location: [facility] CATH LAB;  Service: Cardiovascular;  Laterality: N/A;  . Radioactive seed guided  mastectomy with axillary sentinel lymph node biopsy Right 03/21/2015    Procedure: RIGHT  PARTIAL MASTECTOMY WITH RADIOACTIVE SEED LOCALIZATION RIGHT  AXILLARY SENTINEL LYMPH NODE BIOPSY;  Surgeon: Fanny Skates, MD;  Location: [place];  Service: General;  Laterality: Right;    There were no vitals filed for this visit.  Visit Diagnosis:  Generalized weakness  Right hip pain  Risk for falls      Subjective Assessment - 11/18/15 1119    Subjective My Rt hip felt good for awhile after last session, but the pain just keeps coming and going from the groin area to around to the back of my hip. Almost fell yesterday due to the pain, my leg just kind of gave up on me.    Currently in Pain? Yes   Pain Score 5    Pain Location Hip   Pain Orientation Right;Anterior;Posterior   Pain Descriptors / Indicators Dull   Pain Type Chronic pain   Pain Onset More than a month ago   Aggravating Factors  going from sit to stand   Pain Relieving Factors the massage and IFC has given me temporary relief                         [facility] Adult PT Treatment/Exercise - 11/18/15 0001  Knee/Hip Exercises: Aerobic   Nustep Level 4 x10 minutes with therapist present to monitor pt   Knee/Hip Exercises: Standing   Heel Raises Both;20 reps  Heel-toe raises with +2 HHA at counter   Hip Flexion Stengthening;Both;10 reps  +1 HHA at counter   Hip Abduction Stengthening;Both;10 reps  +2 HHA at counter   Hip Extension Stengthening;Both;10 reps  +2 HHA at counter   Extension Limitations Cuing for slow, controlled pace but no increase pain with these today   Electrical Stimulation   Electrical Stimulation Location Rt hip in Lt S/L   Electrical Stimulation Action IFC   Electrical Stimulation Parameters 80-150 Hz x15 minutes   Electrical Stimulation Goals Pain   Manual Therapy   Soft tissue mobilization In Lt S/L to Rt lateral and posterior hip though pt reported her pain was  strarting to increase with this today so stopped to assess her ASIS, see below.    Muscle Energy Technique In Supine Rt ASIS was posteriorly rotated so had pt engage her Rt quads against therapist resisting with her hip in 90 degrees and after bil ASIS were equal, 5 reps of slow bridges after this                        [facility] Goals - 11/13/15 1104    CC Long Term Goal  #5   Title increase score on BERG to 46 demonstrating decreased risk for falls   Status On-going   CC Long Term Goal  #6   Title pt will report right hip pain is decreased by 50%  Pts pain was decreased toa 5/10 today. This is the lowest it has been in weeks. 11/13/15   Status On-going            Plan - 11/18/15 1151    Clinical Impression Statement PT tolerated the exercises well today and though she began treatment with a pronounced antalgic gait, it was very minimal after warming up on the NuStep. Did have to stop manual therapy after about 10 minutes as pt reported feeling like her pain was increasing so decided to assess her ASIS as she was having alot of posterior pain and found moderate discrepancy and was corrected well with muscle energy technique. Pt has 1 more scheduled visit.    Pt will benefit from skilled therapeutic intervention in order to improve on the following deficits Pain;Decreased strength;Abnormal gait;Difficulty walking;Decreased balance   Rehab Potential Good   Clinical Impairments Affecting Rehab Potential radiation therapy, hx of fibromyalgia and chronic wrist  pain; arthritis in Rt hip which has been causing her increased pain limiting her exercise tolerance   PT Frequency 2x / week   PT Duration 4 weeks   PT Treatment/Interventions Passive range of motion;Therapeutic exercise;Manual techniques;Patient/family education;ADLs/Self Care Home Management;Balance training;Gait training;Stair training;Therapeutic activities;Neuromuscular re-education;Functional mobility  training;DME Instruction;Moist Heat   PT Next Visit Plan Discharge next visit per plan by [name], PT. Assess HEP, cont IFC, and assess goals.   Consulted and Agree with Plan of Care Patient        Problem List Patient Active Problem List   Diagnosis Date Noted  . Back pain 10/22/2015  . Elevated liver function tests 10/22/2015  . Hyperlipidemia 08/13/2015  . Breast cancer of lower-outer quadrant of right female breast ([place]) 02/13/2015  . Vitamin D deficiency 02/06/2015  . Acute gouty arthritis 07/26/2014  . Type 2 diabetes mellitus with diabetic foot deformity ([place]) 01/01/2014  .  Systolic CHF, chronic ([place]) 02/26/2013  . OBESITY 02/05/2009  . ASTHMA, PERSISTENT, MILD 02/08/2008  . Depression 05/18/2007  . HYPERTENSION, BENIGN SYSTEMIC 01/26/2007  . NEPHROLITHIASIS 01/26/2007  . DJD (degenerative joint disease), multiple sites 01/26/2007    Otelia Limes, PTA 11/18/2015, 11:56 AM  [place], [place], [address] Phone: [phone]   Fax:  [phone]  Name: LASHYRA CROUCH MRN: [id number] Date of Birth: 06-01-1951

## 2015-11-20 ENCOUNTER — Ambulatory Visit: Payer: Medicare Other | Admitting: Physical Therapy

## 2015-11-20 ENCOUNTER — Encounter: Payer: Self-pay | Admitting: Physical Therapy

## 2015-11-20 DIAGNOSIS — R531 Weakness: Secondary | ICD-10-CM

## 2015-11-20 DIAGNOSIS — M25551 Pain in right hip: Secondary | ICD-10-CM | POA: Diagnosis not present

## 2015-11-20 DIAGNOSIS — Z9181 History of falling: Secondary | ICD-10-CM

## 2015-11-20 DIAGNOSIS — M25631 Stiffness of right wrist, not elsewhere classified: Secondary | ICD-10-CM | POA: Diagnosis not present

## 2015-11-20 NOTE — Therapy (Addendum)
[place], [place], [address] Phone: [phone]   Fax:  [phone]  Physical Therapy Treatment  Patient Details  Name: [name] MRN: [id number] Date of Birth: 1950/12/15 Referring Provider: Thea Silversmith  Encounter Date: 11/20/2015      PT End of Session - 11/20/15 1128    Visit Number 18   Date for PT Re-Evaluation 11/21/15   PT Start Time 1102   PT Stop Time 1140  Time spent scheduling appt with orthopedist (non-billable)   PT Time Calculation (min) 38 min   Activity Tolerance Patient limited by pain   Behavior During Therapy [facility] for tasks assessed/performed      Past Medical History  Diagnosis Date  . Hypertension   . Diabetes mellitus   . Depression   . Asthma   . Eczema   . Arthritis   . Cataract   . GERD (gastroesophageal reflux disease)   . History of kidney stones     w/ hx of hydronephrosis - followed by [facility]  . Anemia   . Obesity   . HIV nonspecific serology     2006: indeterminate HIV blood test, seen by ID, felt secondary to cross reacting antibodies with no further workup felt necessary at that time   . Fatty liver     Fatty infiltration of liver noted on 03/2012 CT scan  . CHF, acute ([place]) 05/03/2012  . Breast cancer of lower-outer quadrant of right female breast ([place]) 02/13/2015  . Radiation 05/07/15-06/23/15    Right Breast    Past Surgical History  Procedure Laterality Date  . Cystoscopy w/ litholapaxy / ehl    . Cholecystectomy  2003  . Replacement total knee bilateral  2005 &2006  . Joint replacement      bilateral knee replacement  . Vascular surgery Right 03/15/2013    Ultrasound guided sclerotherapy  . Left and right heart catheterization with coronary angiogram N/A 05/02/2012    Procedure: LEFT AND RIGHT HEART CATHETERIZATION WITH CORONARY ANGIOGRAM;  Surgeon: Burnell Blanks, MD;  Location: [facility] CATH LAB;  Service: Cardiovascular;   Laterality: N/A;  . Radioactive seed guided mastectomy with axillary sentinel lymph node biopsy Right 03/21/2015    Procedure: RIGHT  PARTIAL MASTECTOMY WITH RADIOACTIVE SEED LOCALIZATION RIGHT  AXILLARY SENTINEL LYMPH NODE BIOPSY;  Surgeon: Fanny Skates, MD;  Location: [place];  Service: General;  Laterality: Right;    There were no vitals filed for this visit.  Visit Diagnosis:  Generalized weakness  Right hip pain  Risk for falls      Subjective Assessment - 11/20/15 1105    Subjective My right hip still hurts and it's a 6/10 especially when I try to get up from sitting.  I feel like my pain is maybe 50% better because I can stand and walk longer than I used to.  I can walk 2-3 minutes before sitting and can stand at least 30 minutes.   Pertinent History Has a tremor in both hands, perhaps right > left.  Sprained right wrist bafdy when taking care ofher husband; had a brace that she wore and wonders if she should use it again. Got the brace when she was in [place] home about a year ago; they diagnosed gout.  Is on a medication for gout.  HTN controlled with meds.  Diagnosed with right breast cancer March 2016 with lumpectomy and SLNB 03/21/15.  Finished radiation 06/23/15. h/o kidney stones; cholecystectomy many years  ago; bilat. TKAs about 10 years ago.  h/o bilat. vein stripping about a year ago.  Surgery on both ears to remove cheloids.  CHF, asthma.     Pt reports she has fibromyalgia and has generalized pain too                                                                                                             Patient Stated Goals pain in wrist ease up and to strenghten her wrist a little more She also wants to walk better.    Currently in Pain? Yes   Pain Score 6    Pain Location Back   Pain Orientation Right;Posterior;Lateral   Pain Descriptors / Indicators Dull;Aching;Sharp   Pain Type Chronic pain   Pain Radiating Towards Sometimes down right leg  but not recently   Pain Onset More than a month ago   Pain Frequency Constant   Aggravating Factors  Getting up from sitting   Pain Relieving Factors Nothing   Effect of Pain on Daily Activities effects general mobility   Multiple Pain Sites No            [facility] PT Assessment - 11/20/15 0001    Berg Balance Test   Sit to Stand Able to stand without using hands and stabilize independently   Standing Unsupported Able to stand safely 2 minutes   Sitting with Back Unsupported but Feet Supported on Floor or Stool Able to sit safely and securely 2 minutes   Stand to Sit Controls descent by using hands   Transfers Able to transfer safely, definite need of hands   Standing Unsupported with Eyes Closed Able to stand 10 seconds safely   Standing Ubsupported with Feet Together Able to place feet together independently and stand for 1 minute with supervision   From Standing, Reach Forward with Outstretched Arm Can reach forward >5 cm safely (2")   From Standing Position, Pick up Object from Floor Unable to pick up and needs supervision   From Standing Position, Turn to Look Behind Over each Shoulder Turn sideways only but maintains balance   Turn 360 Degrees Able to turn 360 degrees safely one side only in 4 seconds or less   Standing Unsupported, Alternately Place Feet on Step/Stool Able to complete >2 steps/needs minimal assist   Standing Unsupported, One Foot in Front Able to take small step independently and hold 30 seconds   Standing on One Leg Tries to lift leg/unable to hold 3 seconds but remains standing independently   Total Score 37            [facility] Goals - 11/20/15 1108    CC Long Term Goal  #1   Title Pt. will report at least 60% decrease in right wrist pain.   Time 4   Period Weeks   Status Achieved   CC Long Term Goal  #2   Title Pt. will show strength at 5/5 at right wrist, and without pain on manual muscle testing.   Time 4  Period Weeks   Status Achieved    CC Long Term Goal  #3   Title Right wrist flexion to at least 60 degrees.   Time 4   Period Weeks   Status Achieved   CC Long Term Goal  #4   Title Reduce quick DASH score to less than 20, showing improved function.   Time 4   Period Weeks   Status Achieved   CC Long Term Goal  #5   Title increase score on BERG to 46 demonstrating decreased risk for falls   Time 4   Period Weeks   Status On-going   CC Long Term Goal  #6   Title pt will report right hip pain is decreased by 50%   Time 4   Period Weeks   Status Achieved            Plan - 2015-12-15 1128    Clinical Impression Statement Discussed at length with patient her hip pain which may be also right lower back pain.  She agrees that she has mad some progress but is frustrated that she continues to have significant pain limiting her walking and transfers.  She agreed that seeing an orthopedist may be a good idea at this point and requested I help her schedule an appointment.    Phoned [place] at her request and scheduled an appointment for Dec 22, 2015 at 3:30 with Dr. Tonita Cong.  She agreed to be placed on hold today until seen by an orthopedist in case he requests further PT different from what we have already tried.   Pt will benefit from skilled therapeutic intervention in order to improve on the following deficits Pain;Decreased strength;Abnormal gait;Difficulty walking;Decreased balance   Rehab Potential Good   Clinical Impairments Affecting Rehab Potential radiation therapy, hx of fibromyalgia and chronic wrist  pain; arthritis in Rt hip which has been causing her increased pain limiting her exercise tolerance   PT Frequency 2x / week   PT Duration 4 weeks   PT Treatment/Interventions Passive range of motion;Therapeutic exercise;Manual techniques;Patient/family education;ADLs/Self Care Home Management;Balance training;Gait training;Stair training;Therapeutic activities;Neuromuscular re-education;Functional mobility  training;DME Instruction;Moist Heat   PT Next Visit Plan On hold and will call us after she sees Dr. Tonita Cong on 12-22-2015          G-Codes - 12-15-2015 Feb 02, 1216    Functional Assessment Tool Used clinical judgement   Functional Limitation Carrying, moving and handling objects   Carrying, Moving and Handling Objects Current Status (X6553) At least 1 percent but less than 20 percent impaired, limited or restricted   Carrying, Moving and Handling Objects Goal Status (Z4827) At least 1 percent but less than 20 percent impaired, limited or restricted      Problem List Patient Active Problem List   Diagnosis Date Noted  . Back pain 10/22/2015  . Elevated liver function tests 10/22/2015  . Hyperlipidemia 08/13/2015  . Breast cancer of lower-outer quadrant of right female breast ([place]) 02/13/2015  . Vitamin D deficiency 02/06/2015  . Acute gouty arthritis 07/26/2014  . Type 2 diabetes mellitus with diabetic foot deformity ([place]) 01/01/2014  . Systolic CHF, chronic ([place]) 02/26/2013  . OBESITY 02/05/2009  . ASTHMA, PERSISTENT, MILD 02/08/2008  . Depression 05/18/2007  . HYPERTENSION, BENIGN SYSTEMIC 01/26/2007  . NEPHROLITHIASIS 01/26/2007  . DJD (degenerative joint disease), multiple sites 01/26/2007      Annia Friendly, PT 2015/12/15 12:19 PM     PHYSICAL THERAPY DISCHARGE SUMMARY  Visits from Start of Care: 18  Current  functional level related to goals / functional outcomes: As above    Remaining deficits: As above    Education / Equipment: As above   Plan: Patient agrees to discharge.  Patient goals were partially met. Patient is being discharged due to the physician's request.  ?????        Maudry Diego, PT 12/25/2015 10:49 AM    [place], [place], [address] Phone: [phone]   Fax:  [phone]  Name: [name] MRN: [id number] Date of Birth: [birth year]

## 2015-11-26 ENCOUNTER — Other Ambulatory Visit: Payer: Self-pay | Admitting: Family Medicine

## 2015-12-02 ENCOUNTER — Encounter: Payer: Self-pay | Admitting: Podiatry

## 2015-12-02 ENCOUNTER — Ambulatory Visit (INDEPENDENT_AMBULATORY_CARE_PROVIDER_SITE_OTHER): Payer: Medicare Other | Admitting: Podiatry

## 2015-12-02 DIAGNOSIS — B351 Tinea unguium: Secondary | ICD-10-CM | POA: Diagnosis not present

## 2015-12-02 DIAGNOSIS — M79676 Pain in unspecified toe(s): Secondary | ICD-10-CM | POA: Diagnosis not present

## 2015-12-02 DIAGNOSIS — E1142 Type 2 diabetes mellitus with diabetic polyneuropathy: Secondary | ICD-10-CM

## 2015-12-02 NOTE — Progress Notes (Signed)
Patient ID: [name], female   DOB: 25-Mar-1951, 65 y.o.   MRN: [id number] Complaint:  Visit Type: Patient returns to my office for continued preventative foot care services. Complaint: Patient states" my nails have grown long and thick and become painful to walk and wear shoes" Patient has been diagnosed with DM with no complications. He presents for preventative foot care services. No changes to ROS  Podiatric Exam: Vascular: dorsalis pedis and posterior tibial pulses are palpable bilateral. Capillary return is immediate. Temperature gradient is WNL. Skin turgor WNL  Sensorium: Normal Semmes Weinstein monofilament test. Normal tactile sensation bilaterally. Nail Exam: Pt has thick disfigured discolored nails second and third toenails left foot. Ulcer Exam: There is no evidence of ulcer or pre-ulcerative changes or infection. Orthopedic Exam: Muscle tone and strength are WNL. No limitations in general ROM. No crepitus or effusions noted. Foot type and digits show no abnormalities. Bony prominences are unremarkable. Asymptomatic HAV B/L. Skin: No Porokeratosis. No infection or ulcers  Diagnosis:  Tinea unguium, , pain in left toes  Treatment & Plan Procedures and Treatment: Consent by patient was obtained for treatment procedures. The patient understood the discussion of treatment and procedures well. All questions were answered thoroughly reviewed. Debridement of mycotic and hypertrophic toenails, 1 through 5 bilateral and clearing of subungual debris. No ulceration, no infection noted.  Return Visit-Office Procedure: Patient instructed to return to the office for a follow up visit 3 months for continued evaluation and treatment.   Gardiner Barefoot DPM

## 2015-12-08 DIAGNOSIS — M5136 Other intervertebral disc degeneration, lumbar region: Secondary | ICD-10-CM | POA: Diagnosis not present

## 2015-12-08 DIAGNOSIS — M4316 Spondylolisthesis, lumbar region: Secondary | ICD-10-CM | POA: Diagnosis not present

## 2015-12-08 DIAGNOSIS — M545 Low back pain: Secondary | ICD-10-CM | POA: Diagnosis not present

## 2015-12-11 ENCOUNTER — Telehealth: Payer: Self-pay | Admitting: Family Medicine

## 2015-12-11 MED ORDER — GABAPENTIN 100 MG PO CAPS: ORAL_CAPSULE | ORAL | Status: DC

## 2015-12-11 NOTE — Telephone Encounter (Signed)
I sent in a refill  Thanks for the great detective work on figuring out what happened  [facility]

## 2015-12-11 NOTE — Telephone Encounter (Signed)
Patient is aware of refill and informed that she would need to keep her February appt. Ledger Heindl,CMA

## 2015-12-11 NOTE — Telephone Encounter (Signed)
Spoke with pharmacy and patient picked up script on 10/22/15 for gabapentin disp#450.  This script didn't have refills so when she asked pharmacy to fill medication again in December they filled her old script for #270.  Patient has since run out and was unable to take them at new dose of 4-5 capsules TID.  She is scheduled to be seen on 01/07/16 with PCP and would like know if she can get enough medication to last until then.  Johnney Ou

## 2015-12-11 NOTE — Telephone Encounter (Signed)
Pt called and would like to speak to a nurse about her Gabapentin. She thinks she is being given the wrong dosage or QTY she like to speak to a nurse from [name] team. [name]

## 2015-12-17 DIAGNOSIS — M1611 Unilateral primary osteoarthritis, right hip: Secondary | ICD-10-CM | POA: Diagnosis not present

## 2015-12-17 DIAGNOSIS — M545 Low back pain: Secondary | ICD-10-CM | POA: Diagnosis not present

## 2015-12-23 DIAGNOSIS — M1611 Unilateral primary osteoarthritis, right hip: Secondary | ICD-10-CM | POA: Diagnosis not present

## 2015-12-23 DIAGNOSIS — M4316 Spondylolisthesis, lumbar region: Secondary | ICD-10-CM | POA: Diagnosis not present

## 2015-12-23 DIAGNOSIS — M5136 Other intervertebral disc degeneration, lumbar region: Secondary | ICD-10-CM | POA: Diagnosis not present

## 2015-12-26 ENCOUNTER — Other Ambulatory Visit: Payer: Self-pay | Admitting: Hematology

## 2016-01-02 ENCOUNTER — Encounter: Payer: Self-pay | Admitting: Family Medicine

## 2016-01-02 ENCOUNTER — Ambulatory Visit (INDEPENDENT_AMBULATORY_CARE_PROVIDER_SITE_OTHER): Payer: Medicare Other | Admitting: Family Medicine

## 2016-01-02 ENCOUNTER — Ambulatory Visit (HOSPITAL_COMMUNITY)
Admission: RE | Admit: 2016-01-02 | Discharge: 2016-01-02 | Disposition: A | Discharge status: Home / Self Care | Payer: Medicare Other | Source: Ambulatory Visit | Attending: Family Medicine | Admitting: Family Medicine

## 2016-01-02 ENCOUNTER — Telehealth: Payer: Self-pay | Admitting: Family Medicine

## 2016-01-02 VITALS — BP 125/48 | HR 44 | Temp 97.7°F | Ht 62.0 in | Wt 216.4 lb

## 2016-01-02 DIAGNOSIS — R0602 Shortness of breath: Secondary | ICD-10-CM | POA: Insufficient documentation

## 2016-01-02 DIAGNOSIS — I493 Ventricular premature depolarization: Secondary | ICD-10-CM | POA: Insufficient documentation

## 2016-01-02 DIAGNOSIS — I498 Other specified cardiac arrhythmias: Secondary | ICD-10-CM | POA: Diagnosis not present

## 2016-01-02 DIAGNOSIS — R42 Dizziness and giddiness: Secondary | ICD-10-CM

## 2016-01-02 DIAGNOSIS — R001 Bradycardia, unspecified: Secondary | ICD-10-CM | POA: Diagnosis not present

## 2016-01-02 NOTE — Patient Instructions (Addendum)
Cut your metoprolol succinate (25mg  XL) tablet IN HALF and take only that half tablet per day until you follow up with [name]  Monitor your weight as you are doing for any fluctuations.  If you develop any cough, fevers, shortness of breath gets worse, CHEST PAIN, please call the clinic for advice or go directly to the ED.

## 2016-01-02 NOTE — Telephone Encounter (Signed)
.  need

## 2016-01-02 NOTE — Progress Notes (Signed)
   Subjective:    Patient ID: [name], female    DOB: [birth year], 65 y.o.   MRN: [id number]  HPI  Patient presents for Same Day Appointment  CC: short of breath  # Short of breath:  Started somewhat abruptly when she woke up on Monday/4 days ago. Says she was fine Sunday.  Has feeling of shortness of breath with any physical activity. Also says that she has some feelings of chest heaviness/discomfort (which she says is NOT present currently)  Associated dizziness, lightheadedness, nausea (without vomiting)  Denies any recent weight gains, swelling. She says she is actively trying to lose weight (down about 15lbs in past few months)  Says she has been using her asthma inhalers without much relief (flovent, albuterol)  Says she was treated for her breast cancer in July with surgery and radiation therapy, continues to be on aromasin orally and has follow up with oncology soon ROS: denies fevers, chills, cough, runny nose, sore throat, 1 episode of diarrhea today, no increased leg swelling (has some leg swelling at baseline)  Social Hx: never smoker  Review of Systems   See HPI for ROS.   Past medical history, surgical, family, and social history reviewed and updated in the EMR as appropriate.  Objective:  BP 125/48 mmHg  Pulse 44  Temp(Src) 97.7 F (36.5 C) (Oral)  Ht 5\' 2"  (1.575 m)  Wt 216 lb 6.4 oz (98.158 kg)  BMI 39.57 kg/m2 Vitals and nursing note reviewed  General: appears fatigued HEENT: sclera mildly icteric appearing CV: bradycardic with PVCs, no murmur appreciated. 2+ radial pulses bilaterally  Resp: clear to auscultation bilaterally, no wheezes, rhonchi or crackles, normal effort  Assessment & Plan:  1. Shortness of breath / symptomatic PVCs Suspect symptoms are from symptomatic PVCs (though no complaints of palpitations). EKG in clinic with frequent PVC without ST changes, compared to prior EKG from 05/2014 which had 0 PVCs. Lung exam is clear and does  not lead me to believe she has pneumonia or CHF exacerbation (actually has lost a significant amount of weight loss intentionally). Last Echo was in 2015 as well. Discussed case with Dr. Ree Kida and feel with her risk factors likely requires stress testing to evaluate for ischemic causes of new PVCs. Also asked her to cut her metoprolol in half to 12.5mg  XL and continue this daily until directed. - EKG 12-Lead - EKG 12-Lead - Ambulatory referral to Cardiology

## 2016-01-07 ENCOUNTER — Encounter (HOSPITAL_COMMUNITY): Payer: Self-pay | Admitting: Vascular Surgery

## 2016-01-07 ENCOUNTER — Emergency Department (HOSPITAL_COMMUNITY): Payer: Medicare Other

## 2016-01-07 ENCOUNTER — Ambulatory Visit (HOSPITAL_COMMUNITY)
Admission: RE | Admit: 2016-01-07 | Discharge: 2016-01-07 | Disposition: A | Discharge status: Home / Self Care | Payer: Medicare Other | Source: Ambulatory Visit | Attending: Family Medicine | Admitting: Family Medicine

## 2016-01-07 ENCOUNTER — Other Ambulatory Visit: Payer: Self-pay

## 2016-01-07 ENCOUNTER — Encounter: Payer: Self-pay | Admitting: Family Medicine

## 2016-01-07 ENCOUNTER — Telehealth: Payer: Self-pay | Admitting: Family Medicine

## 2016-01-07 ENCOUNTER — Ambulatory Visit (INDEPENDENT_AMBULATORY_CARE_PROVIDER_SITE_OTHER): Payer: Medicare Other | Admitting: Family Medicine

## 2016-01-07 ENCOUNTER — Observation Stay (HOSPITAL_COMMUNITY)
Admission: EM | Admit: 2016-01-07 | Discharge: 2016-01-09 | Disposition: A | Discharge status: Home / Self Care | Payer: Medicare Other | Attending: Family Medicine | Admitting: Family Medicine

## 2016-01-07 VITALS — BP 139/63 | HR 83 | Temp 97.7°F | Ht 62.0 in | Wt 217.0 lb

## 2016-01-07 DIAGNOSIS — I509 Heart failure, unspecified: Secondary | ICD-10-CM | POA: Diagnosis not present

## 2016-01-07 DIAGNOSIS — F329 Major depressive disorder, single episode, unspecified: Secondary | ICD-10-CM | POA: Insufficient documentation

## 2016-01-07 DIAGNOSIS — Z79899 Other long term (current) drug therapy: Secondary | ICD-10-CM | POA: Insufficient documentation

## 2016-01-07 DIAGNOSIS — R739 Hyperglycemia, unspecified: Secondary | ICD-10-CM | POA: Diagnosis not present

## 2016-01-07 DIAGNOSIS — K760 Fatty (change of) liver, not elsewhere classified: Secondary | ICD-10-CM | POA: Insufficient documentation

## 2016-01-07 DIAGNOSIS — D649 Anemia, unspecified: Secondary | ICD-10-CM | POA: Diagnosis not present

## 2016-01-07 DIAGNOSIS — M21969 Unspecified acquired deformity of unspecified lower leg: Secondary | ICD-10-CM | POA: Diagnosis not present

## 2016-01-07 DIAGNOSIS — Z923 Personal history of irradiation: Secondary | ICD-10-CM | POA: Insufficient documentation

## 2016-01-07 DIAGNOSIS — I5022 Chronic systolic (congestive) heart failure: Secondary | ICD-10-CM

## 2016-01-07 DIAGNOSIS — R110 Nausea: Secondary | ICD-10-CM | POA: Insufficient documentation

## 2016-01-07 DIAGNOSIS — Z7982 Long term (current) use of aspirin: Secondary | ICD-10-CM | POA: Insufficient documentation

## 2016-01-07 DIAGNOSIS — E118 Type 2 diabetes mellitus with unspecified complications: Secondary | ICD-10-CM | POA: Insufficient documentation

## 2016-01-07 DIAGNOSIS — Z87442 Personal history of urinary calculi: Secondary | ICD-10-CM | POA: Diagnosis not present

## 2016-01-07 DIAGNOSIS — I10 Essential (primary) hypertension: Secondary | ICD-10-CM | POA: Diagnosis not present

## 2016-01-07 DIAGNOSIS — H269 Unspecified cataract: Secondary | ICD-10-CM | POA: Diagnosis not present

## 2016-01-07 DIAGNOSIS — R251 Tremor, unspecified: Secondary | ICD-10-CM | POA: Insufficient documentation

## 2016-01-07 DIAGNOSIS — J45901 Unspecified asthma with (acute) exacerbation: Secondary | ICD-10-CM | POA: Insufficient documentation

## 2016-01-07 DIAGNOSIS — E669 Obesity, unspecified: Secondary | ICD-10-CM | POA: Diagnosis not present

## 2016-01-07 DIAGNOSIS — R42 Dizziness and giddiness: Secondary | ICD-10-CM | POA: Diagnosis not present

## 2016-01-07 DIAGNOSIS — Z853 Personal history of malignant neoplasm of breast: Secondary | ICD-10-CM | POA: Diagnosis not present

## 2016-01-07 DIAGNOSIS — Z9889 Other specified postprocedural states: Secondary | ICD-10-CM | POA: Diagnosis not present

## 2016-01-07 DIAGNOSIS — K219 Gastro-esophageal reflux disease without esophagitis: Secondary | ICD-10-CM | POA: Diagnosis not present

## 2016-01-07 DIAGNOSIS — E119 Type 2 diabetes mellitus without complications: Secondary | ICD-10-CM | POA: Diagnosis not present

## 2016-01-07 DIAGNOSIS — R0789 Other chest pain: Secondary | ICD-10-CM

## 2016-01-07 DIAGNOSIS — G459 Transient cerebral ischemic attack, unspecified: Secondary | ICD-10-CM | POA: Diagnosis not present

## 2016-01-07 DIAGNOSIS — E785 Hyperlipidemia, unspecified: Secondary | ICD-10-CM

## 2016-01-07 DIAGNOSIS — Z872 Personal history of diseases of the skin and subcutaneous tissue: Secondary | ICD-10-CM | POA: Insufficient documentation

## 2016-01-07 DIAGNOSIS — R079 Chest pain, unspecified: Principal | ICD-10-CM | POA: Diagnosis present

## 2016-01-07 DIAGNOSIS — F8081 Childhood onset fluency disorder: Secondary | ICD-10-CM | POA: Insufficient documentation

## 2016-01-07 DIAGNOSIS — E1169 Type 2 diabetes mellitus with other specified complication: Secondary | ICD-10-CM | POA: Diagnosis not present

## 2016-01-07 DIAGNOSIS — R404 Transient alteration of awareness: Secondary | ICD-10-CM | POA: Diagnosis not present

## 2016-01-07 DIAGNOSIS — R51 Headache: Secondary | ICD-10-CM | POA: Diagnosis not present

## 2016-01-07 LAB — TSH: TSH: 1.228 u[IU]/mL (ref 0.350–4.500)

## 2016-01-07 LAB — CBC
HCT: 35 % — ABNORMAL LOW (ref 36.0–46.0)
Hemoglobin: 11 g/dL — ABNORMAL LOW (ref 12.0–15.0)
MCH: 28.8 pg (ref 26.0–34.0)
MCHC: 31.4 g/dL (ref 30.0–36.0)
MCV: 91.6 fL (ref 78.0–100.0)
PLATELETS: 247 10*3/uL (ref 150–400)
RBC: 3.82 MIL/uL — ABNORMAL LOW (ref 3.87–5.11)
RDW: 13.5 % (ref 11.5–15.5)
WBC: 7.1 10*3/uL (ref 4.0–10.5)

## 2016-01-07 LAB — COMPREHENSIVE METABOLIC PANEL
ALBUMIN: 3.3 g/dL — AB (ref 3.5–5.0)
ALK PHOS: 166 U/L — AB (ref 38–126)
ALT: 44 U/L (ref 14–54)
AST: 27 U/L (ref 15–41)
Anion gap: 12 (ref 5–15)
BILIRUBIN TOTAL: 1.3 mg/dL — AB (ref 0.3–1.2)
BUN: 28 mg/dL — ABNORMAL HIGH (ref 6–20)
CALCIUM: 9.5 mg/dL (ref 8.9–10.3)
CO2: 25 mmol/L (ref 22–32)
CREATININE: 1.47 mg/dL — AB (ref 0.44–1.00)
Chloride: 96 mmol/L — ABNORMAL LOW (ref 101–111)
GFR calc Af Amer: 42 mL/min — ABNORMAL LOW (ref >60)
GFR, EST NON AFRICAN AMERICAN: 36 mL/min — AB (ref >60)
Glucose, Bld: 355 mg/dL — ABNORMAL HIGH (ref 65–99)
Potassium: 5.1 mmol/L (ref 3.5–5.1)
Sodium: 133 mmol/L — ABNORMAL LOW (ref 135–145)
Total Protein: 7.1 g/dL (ref 6.5–8.1)

## 2016-01-07 LAB — I-STAT CHEM 8, ED
BUN: 35 mg/dL — ABNORMAL HIGH (ref 6–20)
CALCIUM ION: 1.18 mmol/L (ref 1.13–1.30)
Chloride: 97 mmol/L — ABNORMAL LOW (ref 101–111)
Creatinine, Ser: 1.3 mg/dL — ABNORMAL HIGH (ref 0.44–1.00)
GLUCOSE: 359 mg/dL — AB (ref 65–99)
HCT: 38 % (ref 36.0–46.0)
HEMOGLOBIN: 12.9 g/dL (ref 12.0–15.0)
Potassium: 4.9 mmol/L (ref 3.5–5.1)
Sodium: 133 mmol/L — ABNORMAL LOW (ref 135–145)
TCO2: 28 mmol/L (ref 0–100)

## 2016-01-07 LAB — POCT GLYCOSYLATED HEMOGLOBIN (HGB A1C): Hemoglobin A1C: 10.8

## 2016-01-07 LAB — LIPID PANEL
Cholesterol: 229 mg/dL — ABNORMAL HIGH (ref 0–200)
HDL: 39 mg/dL — ABNORMAL LOW (ref >40)
LDL CALC: UNDETERMINED mg/dL (ref 0–99)
TRIGLYCERIDES: 437 mg/dL — AB (ref <150)
Total CHOL/HDL Ratio: 5.9 RATIO
VLDL: UNDETERMINED mg/dL (ref 0–40)

## 2016-01-07 LAB — GLUCOSE, CAPILLARY
GLUCOSE-CAPILLARY: 316 mg/dL — AB (ref 65–99)
Glucose-Capillary: 344 mg/dL — ABNORMAL HIGH (ref 65–99)

## 2016-01-07 LAB — TROPONIN I: Troponin I: <0.03 ng/mL (ref <0.031)

## 2016-01-07 LAB — URINALYSIS, ROUTINE W REFLEX MICROSCOPIC
BILIRUBIN URINE: NEGATIVE
HGB URINE DIPSTICK: NEGATIVE
KETONES UR: NEGATIVE mg/dL
Leukocytes, UA: NEGATIVE
Nitrite: NEGATIVE
PROTEIN: NEGATIVE mg/dL
Specific Gravity, Urine: 1.011 (ref 1.005–1.030)
pH: 5 (ref 5.0–8.0)

## 2016-01-07 LAB — I-STAT TROPONIN, ED: TROPONIN I, POC: 0 ng/mL (ref 0.00–0.08)

## 2016-01-07 LAB — DIFFERENTIAL
Basophils Absolute: 0 10*3/uL (ref 0.0–0.1)
Basophils Relative: 0 %
EOS ABS: 0.1 10*3/uL (ref 0.0–0.7)
Eosinophils Relative: 1 %
LYMPHS ABS: 2.1 10*3/uL (ref 0.7–4.0)
Lymphocytes Relative: 30 %
MONOS PCT: 8 %
Monocytes Absolute: 0.6 10*3/uL (ref 0.1–1.0)
Neutro Abs: 4.4 10*3/uL (ref 1.7–7.7)
Neutrophils Relative %: 61 %

## 2016-01-07 LAB — PROTIME-INR
INR: 1.03 (ref 0.00–1.49)
PROTHROMBIN TIME: 13.7 s (ref 11.6–15.2)

## 2016-01-07 LAB — URINE MICROSCOPIC-ADD ON: RBC / HPF: NONE SEEN RBC/hpf (ref 0–5)

## 2016-01-07 LAB — MAGNESIUM: MAGNESIUM: 2 mg/dL (ref 1.7–2.4)

## 2016-01-07 LAB — BRAIN NATRIURETIC PEPTIDE: B NATRIURETIC PEPTIDE 5: 66.4 pg/mL (ref 0.0–100.0)

## 2016-01-07 LAB — APTT: aPTT: 28 seconds (ref 24–37)

## 2016-01-07 MED ORDER — EXEMESTANE 25 MG PO TABS
25.0000 mg | ORAL_TABLET | Freq: Every day | ORAL | Status: DC
  Administered 2016-01-08 – 2016-01-09 (×2): 25 mg via ORAL
  Filled 2016-01-07 (×3): qty 1

## 2016-01-07 MED ORDER — HEPARIN SODIUM (PORCINE) 5000 UNIT/ML IJ SOLN
5000.0000 [IU] | Freq: Three times a day (TID) | INTRAMUSCULAR | Status: DC
  Administered 2016-01-07 – 2016-01-09 (×6): 5000 [IU] via SUBCUTANEOUS
  Filled 2016-01-07 (×6): qty 1

## 2016-01-07 MED ORDER — FAMOTIDINE 20 MG PO TABS
20.0000 mg | ORAL_TABLET | Freq: Every day | ORAL | Status: DC
Start: 2016-01-07 — End: 2016-01-09
  Administered 2016-01-07 – 2016-01-09 (×3): 20 mg via ORAL
  Filled 2016-01-07 (×3): qty 1

## 2016-01-07 MED ORDER — ALBUTEROL SULFATE (2.5 MG/3ML) 0.083% IN NEBU: 2.5000 mg | INHALATION_SOLUTION | Freq: Four times a day (QID) | RESPIRATORY_TRACT | Status: DC | PRN

## 2016-01-07 MED ORDER — FUROSEMIDE 40 MG PO TABS
40.0000 mg | ORAL_TABLET | Freq: Every day | ORAL | Status: DC
  Administered 2016-01-08: 40 mg via ORAL
  Filled 2016-01-07: qty 1

## 2016-01-07 MED ORDER — BUDESONIDE 0.25 MG/2ML IN SUSP
0.2500 mg | Freq: Two times a day (BID) | RESPIRATORY_TRACT | Status: DC
  Administered 2016-01-07 – 2016-01-08 (×3): 0.25 mg via RESPIRATORY_TRACT
  Filled 2016-01-07 (×3): qty 2

## 2016-01-07 MED ORDER — SODIUM CHLORIDE 0.9 % IV BOLUS (SEPSIS)
1000.0000 mL | Freq: Once | INTRAVENOUS | Status: AC
  Administered 2016-01-07: 1000 mL via INTRAVENOUS

## 2016-01-07 MED ORDER — ASPIRIN 81 MG PO CHEW
81.0000 mg | CHEWABLE_TABLET | Freq: Every day | ORAL | Status: DC
  Administered 2016-01-08 – 2016-01-09 (×2): 81 mg via ORAL
  Filled 2016-01-07 (×2): qty 1

## 2016-01-07 MED ORDER — GABAPENTIN 400 MG PO CAPS
400.0000 mg | ORAL_CAPSULE | Freq: Three times a day (TID) | ORAL | Status: DC
  Administered 2016-01-07 – 2016-01-09 (×6): 400 mg via ORAL
  Filled 2016-01-07 (×6): qty 1

## 2016-01-07 MED ORDER — METOPROLOL SUCCINATE ER 25 MG PO TB24
25.0000 mg | ORAL_TABLET | Freq: Every day | ORAL | Status: DC
  Administered 2016-01-08 – 2016-01-09 (×2): 25 mg via ORAL
  Filled 2016-01-07 (×2): qty 1

## 2016-01-07 MED ORDER — SPIRONOLACTONE 25 MG PO TABS
25.0000 mg | ORAL_TABLET | Freq: Every day | ORAL | Status: DC
  Administered 2016-01-08 – 2016-01-09 (×2): 25 mg via ORAL
  Filled 2016-01-07 (×2): qty 1

## 2016-01-07 MED ORDER — SODIUM CHLORIDE 0.9% FLUSH
3.0000 mL | Freq: Two times a day (BID) | INTRAVENOUS | Status: DC
  Administered 2016-01-07 – 2016-01-09 (×5): 3 mL via INTRAVENOUS

## 2016-01-07 MED ORDER — LORAZEPAM 1 MG PO TABS
1.0000 mg | ORAL_TABLET | Freq: Once | ORAL | Status: AC
  Administered 2016-01-07: 1 mg via ORAL
  Filled 2016-01-07: qty 1

## 2016-01-07 MED ORDER — ATORVASTATIN CALCIUM 40 MG PO TABS
40.0000 mg | ORAL_TABLET | Freq: Every day | ORAL | Status: DC
  Administered 2016-01-07 – 2016-01-08 (×2): 40 mg via ORAL
  Filled 2016-01-07 (×2): qty 1

## 2016-01-07 MED ORDER — BENAZEPRIL HCL 20 MG PO TABS
20.0000 mg | ORAL_TABLET | Freq: Every day | ORAL | Status: DC
  Administered 2016-01-08 – 2016-01-09 (×2): 20 mg via ORAL
  Filled 2016-01-07 (×3): qty 1

## 2016-01-07 MED ORDER — GLYBURIDE 5 MG PO TABS
5.0000 mg | ORAL_TABLET | Freq: Every day | ORAL | Status: DC
  Administered 2016-01-08 – 2016-01-09 (×2): 5 mg via ORAL
  Filled 2016-01-07 (×3): qty 1

## 2016-01-07 MED ORDER — INSULIN ASPART 100 UNIT/ML ~~LOC~~ SOLN
0.0000 [IU] | Freq: Three times a day (TID) | SUBCUTANEOUS | Status: DC
  Administered 2016-01-07: 7 [IU] via SUBCUTANEOUS
  Administered 2016-01-08 (×2): 5 [IU] via SUBCUTANEOUS
  Administered 2016-01-09: 7 [IU] via SUBCUTANEOUS
  Administered 2016-01-09: 2 [IU] via SUBCUTANEOUS

## 2016-01-07 MED ORDER — ACETAMINOPHEN 650 MG RE SUPP: 650.0000 mg | Freq: Four times a day (QID) | RECTAL | Status: DC | PRN

## 2016-01-07 MED ORDER — BUDESONIDE 0.25 MG/2ML IN SUSP: 0.2500 mg | Freq: Two times a day (BID) | RESPIRATORY_TRACT | Status: DC

## 2016-01-07 MED ORDER — HYDROCODONE-ACETAMINOPHEN 5-325 MG PO TABS
1.0000 | ORAL_TABLET | Freq: Four times a day (QID) | ORAL | Status: DC | PRN
  Administered 2016-01-08: 1 via ORAL
  Filled 2016-01-07: qty 1

## 2016-01-07 MED ORDER — SODIUM CHLORIDE 0.9 % IV SOLN
INTRAVENOUS | Status: AC
  Administered 2016-01-07: 1000 mL via INTRAVENOUS

## 2016-01-07 MED ORDER — ACETAMINOPHEN 325 MG PO TABS: 650.0000 mg | ORAL_TABLET | Freq: Four times a day (QID) | ORAL | Status: DC | PRN

## 2016-01-07 NOTE — Progress Notes (Signed)
Family Medicine Teaching Service Daily Progress Note Intern Pager: [phone]  Patient name: [name] Medical record number: [id number] Date of birth: [birth year] Age: 65 y.o. Gender: female  Primary Care Provider: Lind Covert, MD Consultants: Cardiology Code Status: FULL  Pt Overview and Major Events to Date:  2/8 - admitted for ACS rule out  Assessment and Plan: [name] is a 65 y.o. female presenting with worsening chest pressure with shortness of breath with exertion as well as worsening stuttering with hyperglycemia. PMH is significant for type 2 diabetes, breast cancer, systolic CHF, mild persistent asthma, depression, HTN, and DJD.   # Chest Pressure: History of Systolic CHF reported, however last Echo in 06/2014 with EF 55-60% and normal systolic function. Echo from 2013 with EF 20-25% with severely reduced systolic function and grade II diastolic dysfunction. Prescribed Aspirin 81mg  and Lasix 40mg . i-Stat troponin negative.EKG on admission NSR with PVCs. At baseline weight. BNP, TSH WNL. Repeat troponins neg.  - Cardiac Monitoring - Daily weights, I&Os - F/u echo - Repeat EKG - Cardiac consulted - appreciate recs  # Diabetes: Glucose elevated to 359 at admission. A1C elevated to 10.8. Prescribed Gabapentin 100mg  for Neuropathy. Hyperglycemia likely to have contributed to neurologic symptoms (stuttering). MRI negative for acute abnormality. CBGs 344, 316, 270 overnight.  - Monitor CBGs - Sensitive Insulin Sliding Scale - Initiate Glyburide 5mg  and anticipate continuation at discharge - Will initiate Metformin at discharge  # Chronic Kidney Disease: Creatinine 1.30 at admission. Baseline 1.3. Improved to 1.15 today.  - Continue to monitor.   # GERD: Prescribed Ranitidine 150mg . - Pepcid 20mg   # HTN: Prescribed Benazepril 20mg  daily, Metoprolol 25mg  daily, Spironolactone 25mg  - Continue home medications - Continue to monitor. Monitor HR and consider  holding Metoprolol if bradycardic again.  # HLD: Prescribed Atorvastatin 40mg  daily. Elevated total cholesterol and triglycerides, with decreased HDL (2/8). - Continue home medication  # Mild Persistent Asthma: Prescribed Albuterol inhaler PRN wheezing and Flovent BID. - Continue home medications  # DJD: Prescribed Tylenol and Norco PRN pain. - Continue home pain medication.  # Breast Cancer: Prescribed Exemestane.  - Continue home medication  FEN/GI: Heart Healthy/Carb Modified Diet, Pepcid Prophylaxis: subQ heparin  Disposition: home pending medical improvement  Subjective:  Patient denying chest pain this AM, although stutter and R hand tremor have returned. Patient reports four episodes of loose stool overnight, but says this is because she ate mashed potatoes, which normally causes GI upset for her.   Objective: Temp:  [97.3 F (36.3 C)-98.4 F (36.9 C)] 97.3 F (36.3 C) (02/09 0914) Pulse Rate:  [43-87] 79 (02/09 0600) Resp:  [13-24] 19 (02/09 0600) BP: (99-141)/(45-88) 101/67 mmHg (02/09 0600) SpO2:  [98 %-100 %] 99 % (02/09 0600) Weight:  [214 lb 1.1 oz (97.1 kg)-215 lb 3.2 oz (97.614 kg)] 215 lb 3.2 oz (97.614 kg) (02/09 0600) Physical Exam: General: sitting in bedside chair in NAD. Stuttering intermittently throughout encounter.  HEENT: PERRLA, no scleral icterus or discharge, MMM Cardiovascular: RRR, no murmurs appreciated Respiratory: CTAB, no wheezes or rhonchi Abdomen: soft, non-tender, non-distended, +BS Extremities: 5/5 strength upper and lower extremities bilaterally; no LE edema or tenderness; intermittent tremor of R hand Neuro: CN II-XII grossly intact; no issues with finger to nose or rapid alternating hand movements; A&Ox3  Laboratory:  Recent Labs Lab 01/07/16 1040 01/07/16 1102 01/08/16 0351  WBC 7.1  --  6.9  HGB 11.0* 12.9 10.1*  HCT 35.0* 38.0 32.1*  PLT 247  --  241    Recent Labs Lab 01/07/16 1040 01/07/16 1102 01/08/16 0351  NA  133* 133* 135  K 5.1 4.9 4.4  CL 96* 97* 102  CO2 25  --  22  BUN 28* 35* 23*  CREATININE 1.47* 1.30* 1.15*  CALCIUM 9.5  --  8.8*  PROT 7.1  --   --   BILITOT 1.3*  --   --   ALKPHOS 166*  --   --   ALT 44  --   --   AST 27  --   --   GLUCOSE 355* 359* 291*    Imaging/Diagnostic Tests:  01/07/2016 CHEST  2 VIEW  IMPRESSION: No edema or consolidation. Borderline acromioclavicular separation on the right.   01/07/2016 MRI HEAD WITHOUT CONTRAST TECHNIQUE:   IMPRESSION: 1. No acute intracranial abnormality. 2. Mild chronic small vessel ischemic disease, unchanged.    Verner Mould, MD 01/08/2016, 9:24 AM PGY-1, [place] Intern pager: [phone], text pages welcome

## 2016-01-07 NOTE — ED Notes (Signed)
EKG machine in pts room broken. Portable EKG machine required to obtain EKG. Therefore EKG > 10 mins.

## 2016-01-07 NOTE — H&P (Signed)
[facility] Admission History and Physical Service Pager: [phone]  Patient name: [name] Medical record number: [id number] Date of birth: October 13, 1951 Age: 65 y.o. Gender: female  Primary Care Provider: Lind Covert, MD Consultants: Cardiology Code Status: Full (Note Son POA)  Chief Complaint: Chest Pressure with Shortness of Breath. Stuttering. Hyperglycemia.  Assessment and Plan: [name] is a 65 y.o. female presenting with worsening chest pressure with shortness of breath with exertion as well as worsening stuttering with hyperglycemia. PMH is significant for type 2 diabetes, breast cancer, systolic CHF, mild persistent asthma, depression, HTN, and DJD.   # Chest Pressure:  History of Systolic CHF reported, however last Echocardiogram in 06/2014 with EF 55-60% and normal systolic function. Echocardiogram from 2013 with EF 20-25% with severely reduced systolic function and grade II diastolic dysfunction. Prescribed Aspirin 81mg  and Lasix 40mg . Troponin negative.  EKG with sinus rhythm with PVCs noted. At baseline weight. - Place in Observation, [place] attending - Cardiac Monitoring - Monitor weight, I&O - AM EKG - Trend Troponins - Check BNP, TSH, Lipid Panel - Echocardiogram - Consider contacting Cardiology given scheduled follow up with them over next several weeks to discuss if they would like to evaluate during hospitalization.  # Diabetes: Glucose elevated to 359 at admission. A1C elevated to 10.8. Prescribed Gabapentin 100mg  for Neuropathy. Hyperglycemia likely to be contributing to neurologic symptoms (stuttering). MRI negative for acute abnormality.  - Monitor CBGs - Sensitive Insulin Sliding Scale - Initiate Glyburide 5mg  and anticipate continuation at discharge - Will initiate Metformin at discharge - If no improvement in neurological symptoms with tighter blood sugar control, initiate further workup of stuttering.  # Chronic  Kidney Disease: Creatinine 1.30 at admission. Baseline 1.3. - Continue to monitor.  - NS@100   # GERD: Prescribed Ranitidine 150mg . - Pepcid 20mg   # HTN: Prescribed Benazepril 20mg  daily, Metoprolol 25mg  daily, Spironolactone 25mg  - Continue home medications - Continue to monitor. Monitor HR and consider holding Metoprolol if bradycardic again.  # HLD: Prescribed Atorvastatin 40mg  daily.  - Continue home medication  # Mild Persistent Asthma: Prescribed Albuterol inhaler PRN wheezing and Flovent BID. - Continue home medications  # DJD: Prescribed Tylenol and Norco PRN pain. - Continue home pain medication.  # Breast Cancer: Prescribed Exemestane.  - Continue home medication  FEN/GI: NS @100 . Heart Healthy/Carb Modified Diet. Pepcid Prophylaxis: SQ Heparin.  Disposition: Home following chest pain rule out and improvement in blood sugars  History of Present Illness:  [name] is a 65 y.o. female presenting with worsening chest pressure, shortness of breath, and stuttering.  Two weeks ago she noticed increased epigastric chest pressure without radiation with exertion. Symptoms associated with Shortness of breath. Denies radiation. Denies diaphoresis. Has history of GERD and thought this might be due to that, but denies relation to meals or worsening at night. Reports history of CHF, but denies similar episodes in past. Also reports worsening stutter since Sunday. Has never had stuttering before. Diabetes medication recently discontinued to to being well controlled with A1C 7.8, however A1C was noted to be significantly worsened in clinic at 10.8 today. Denies history of alcohol use or smoking. States she is scheduled to see cardiology later this month.   Review Of Systems: Per HPI  Otherwise the remainder of the systems were negative.  Patient Active Problem List   Diagnosis Date Noted  . Chest pain 01/07/2016  . Back pain 10/22/2015  . Elevated liver function tests 10/22/2015   .  Hyperlipidemia 08/13/2015  . Breast cancer of lower-outer quadrant of right female breast ([place]) 02/13/2015  . Vitamin D deficiency 02/06/2015  . Acute gouty arthritis 07/26/2014  . Type 2 diabetes mellitus with diabetic foot deformity ([place]) 01/01/2014  . Systolic CHF, chronic ([place]) 02/26/2013  . OBESITY 02/05/2009  . ASTHMA, PERSISTENT, MILD 02/08/2008  . Depression 05/18/2007  . HYPERTENSION, BENIGN SYSTEMIC 01/26/2007  . NEPHROLITHIASIS 01/26/2007  . DJD (degenerative joint disease), multiple sites 01/26/2007    Past Medical History: Past Medical History  Diagnosis Date  . Hypertension   . Diabetes mellitus   . Depression   . Asthma   . Eczema   . Arthritis   . Cataract   . GERD (gastroesophageal reflux disease)   . History of kidney stones     w/ hx of hydronephrosis - followed by [facility]  . Anemia   . Obesity   . HIV nonspecific serology     2006: indeterminate HIV blood test, seen by ID, felt secondary to cross reacting antibodies with no further workup felt necessary at that time   . Fatty liver     Fatty infiltration of liver noted on 03/2012 CT scan  . CHF, acute ([place]) 05/03/2012  . Breast cancer of lower-outer quadrant of right female breast ([place]) 02/13/2015  . Radiation 05/07/15-06/23/15    Right Breast    Past Surgical History: Past Surgical History  Procedure Laterality Date  . Cystoscopy w/ litholapaxy / ehl    . Cholecystectomy  2003  . Replacement total knee bilateral  2005 &2006  . Joint replacement      bilateral knee replacement  . Vascular surgery Right 03/15/2013    Ultrasound guided sclerotherapy  . Left and right heart catheterization with coronary angiogram N/A 05/02/2012    Procedure: LEFT AND RIGHT HEART CATHETERIZATION WITH CORONARY ANGIOGRAM;  Surgeon: Burnell Blanks, MD;  Location: [facility] CATH LAB;  Service: Cardiovascular;  Laterality: N/A;  . Radioactive seed guided mastectomy with axillary sentinel lymph node biopsy Right  03/21/2015    Procedure: RIGHT  PARTIAL MASTECTOMY WITH RADIOACTIVE SEED LOCALIZATION RIGHT  AXILLARY SENTINEL LYMPH NODE BIOPSY;  Surgeon: Fanny Skates, MD;  Location: [facility];  Service: General;  Laterality: Right;    Social History: Social History  Substance Use Topics  . Smoking status: Never Smoker   . Smokeless tobacco: Never Used  . Alcohol Use: No   Please also refer to relevant sections of EMR.  Family History: Family History  Problem Relation Age of Onset  . Diabetes Mother   . Stroke Mother   . Heart disease Father   . Anemia Father   . Cancer Sister 17    nose cancer   . Cancer Cousin 30    ovarian cancer   . Cancer Cousin 49    ovarian cancer    Allergies and Medications: No Known Allergies No current facility-administered medications on file prior to encounter.   Current Outpatient Prescriptions on File Prior to Encounter  Medication Sig Dispense Refill  . acetaminophen (TYLENOL 8 HOUR ARTHRITIS PAIN) 650 MG CR tablet Take 650 mg by mouth every 8 (eight) hours as needed for pain.    Marland Kitchen albuterol (PROVENTIL HFA;VENTOLIN HFA) 108 (90 BASE) MCG/ACT inhaler Inhale 2 puffs into the lungs every 6 (six) hours as needed for wheezing or shortness of breath.    Marland Kitchen aspirin 81 MG tablet Take 81 mg by mouth daily.    . benazepril (LOTENSIN) 20 MG tablet take  1 tablet by mouth once daily 90 tablet 3  . colchicine 0.6 MG tablet take 1 tablet by mouth twice a day 60 tablet 3  . exemestane (AROMASIN) 25 MG tablet TAKE 1 TABLET BY MOUTH DAILY AFTER BREAKFAST 30 tablet 0  . FLOVENT HFA 110 MCG/ACT inhaler inhale 2 puffs by mouth twice a day 12 g 6  . furosemide (LASIX) 40 MG tablet TAKE 1 TABLET BY MOUTH ONCE DAILY 90 tablet 1  . gabapentin (NEURONTIN) 100 MG capsule 4-5 tabs three times daily (Patient taking differently: Take 400-500 mg by mouth 3 (three) times daily. 4-5 tabs three times daily) 450 capsule 6  . HYDROcodone-acetaminophen (NORCO) 5-325 MG tablet  Take 1-2 tablets by mouth every 6 (six) hours as needed for moderate pain or severe pain. 45 tablet 0  . metoprolol succinate (TOPROL-XL) 25 MG 24 hr tablet Take 1 tablet (25 mg total) by mouth daily. 90 tablet 3  . potassium citrate (UROCIT-K) 10 MEQ (1080 MG) SR tablet TAKE 1 TABLET BY MOUTH 2 TIMES DAILY 60 tablet 6  . ranitidine (ZANTAC) 150 MG tablet TAKE 1 TABLET BY MOUTH AT BEDTIME 30 tablet 11  . spironolactone (ALDACTONE) 25 MG tablet take 1 tablet by mouth once daily 90 tablet 2  . atorvastatin (LIPITOR) 40 MG tablet Take 1 tablet (40 mg total) by mouth daily. (Patient not taking: Reported on 01/07/2016) 90 tablet 3    Objective: BP 131/73 mmHg  Pulse 44  Temp(Src) 97.5 F (36.4 C) (Oral)  Resp 19  SpO2 98% Exam: General: 65yo female resting comfortably in no apparent distress Eyes: PERRLA, EOM intact ENTM: Moist mucous membranes, normal pharynx Neck: supple, no lymphadenopathy Cardiovascular: S1 and S2 noted. Regular rate and rhythm, no chest wall tenderness noted, pulses palpable Respiratory: Clear to auscultation bilaterally, no wheezes, no increased work of breathing Abdomen: Bowel sounds noted, soft and nondistended, no tenderness to palpation MSK: Mild nonpitting edema noted, muscle strength 5/5 in upper and lower extremities bilaterally Skin: No rashes noted, no diaphoresis Neuro: Sensation intact, CN II-XII intact however fasciculations noted when extending tongue. Stuttering noted with speech. Cerebellar function normal. Psych: Normal affect. Alert and Oriented x3.  Labs and Imaging: CBC BMET   Recent Labs Lab 01/07/16 1040 01/07/16 1102  WBC 7.1  --   HGB 11.0* 12.9  HCT 35.0* 38.0  PLT 247  --     Recent Labs Lab 01/07/16 1040 01/07/16 1102  NA 133* 133*  K 5.1 4.9  CL 96* 97*  CO2 25  --   BUN 28* 35*  CREATININE 1.47* 1.30*  GLUCOSE 355* 359*  CALCIUM 9.5  --     - Troponin negative  Dg Chest 2 View  01/07/2016  CLINICAL DATA:  Chest pain  for 1 week EXAM: CHEST  2 VIEW COMPARISON:  March 18, 2015 chest radiograph and chest CT June 03, 2014 FINDINGS: There is no edema or consolidation. The heart size and pulmonary vascularity are normal. No adenopathy. There is postoperative change in the right breast region. There is a large anterior osteophyte in the mid thoracic region, well seen on prior CT and not appreciably changed. There is borderline acromioclavicular separation on the right. IMPRESSION: No edema or consolidation. Borderline acromioclavicular separation on the right. Electronically Signed   By: Lowella Grip III M.D.   On: 01/07/2016 13:34   Mr Brain Wo Contrast (neuro Protocol)  01/07/2016  CLINICAL DATA:  Intermittent speech stuttering. Headache. Chest pressure. Symptoms for 1 week.  EXAM: MRI HEAD WITHOUT CONTRAST TECHNIQUE: Multiplanar, multiecho pulse sequences of the brain and surrounding structures were obtained without intravenous contrast. COMPARISON:  06/03/2014 FINDINGS: There is no evidence of acute infarct, intracranial hemorrhage, mass, midline shift, or extra-axial fluid collection. Ventricles and sulci are normal. T2 hyperintensities in the periventricular greater than subcortical cerebral white matter bilaterally are unchanged and nonspecific but compatible with mild chronic small vessel ischemic disease. Orbits are unremarkable. Bilateral maxillary sinus mucous retention cysts are noted. Mastoid air cells are clear. Major intracranial vascular flow voids are preserved. IMPRESSION: 1. No acute intracranial abnormality. 2. Mild chronic small vessel ischemic disease, unchanged. Electronically Signed   By: Logan Bores M.D.   On: 01/07/2016 13:11   Lorna Few, DO 01/07/2016, 2:27 PM PGY-2, [place] Intern pager: [phone], text pages welcome

## 2016-01-07 NOTE — Telephone Encounter (Signed)
Pt son calling; transferred to [place].

## 2016-01-07 NOTE — ED Notes (Signed)
Pt reports to the ED for eval of intermittent stuttering causing difficulty speaking, HA, and chest pressure x 1 week. She comes from [facility]. Pt reports some nausea but denies any V/D. Also has some SOB with minimal exertion. Pt denies numbness, tingling, unilateral weakness, or vision changes. Pt denies any CP at this time. Pt A&Ox4, resp e/u, and skin warm and dry.

## 2016-01-07 NOTE — Care Management (Signed)
[place] 01-07-16 CM did speak with pt in regards to disposition needs. Pt is from home with daughter and granddaughter. Pt states she uses a cane for DME. Pt has transportation to and from MD appointments. No needs at this time. CM will continue to monitor. Bethena Roys, RN, BSN [phone].

## 2016-01-07 NOTE — Discharge Summary (Signed)
[facility] Discharge Summary  Patient name: [name] Medical record number: [id number] Date of birth: 11-27-1951 Age: 65 y.o. Gender: female Date of Admission: 01/07/2016  Date of Discharge: 01/09/2016 Admitting Physician: Kinnie Feil, MD  Primary Care Provider: Lind Covert, MD Consultants: cardiology  Indication for Hospitalization: chest pain/ACS rule out  Discharge Diagnoses/Problem List:  Patient Active Problem List   Diagnosis Date Noted  . Chest pain 01/07/2016  . Chest pressure 01/07/2016  . Pain in the chest   . Tremor of both hands   . Stuttering   . Type 2 diabetes mellitus with complication, without long-term current use of insulin ([place])   . Hyperglycemia   . Essential hypertension   . Back pain 10/22/2015  . Elevated liver function tests 10/22/2015  . Hyperlipidemia 08/13/2015  . Breast cancer of lower-outer quadrant of right female breast ([place]) 02/13/2015  . Vitamin D deficiency 02/06/2015  . Acute gouty arthritis 07/26/2014  . Type 2 diabetes mellitus with diabetic foot deformity ([place]) 01/01/2014  . Systolic CHF, chronic ([place]) 02/26/2013  . OBESITY 02/05/2009  . ASTHMA, PERSISTENT, MILD 02/08/2008  . Depression 05/18/2007  . HYPERTENSION, BENIGN SYSTEMIC 01/26/2007  . NEPHROLITHIASIS 01/26/2007  . DJD (degenerative joint disease), multiple sites 01/26/2007     Disposition: home  Discharge Condition: improved  Discharge Exam:  General: sitting in bedside chair in NAD. Daughter at bedside. Speaking clearly with no stuttering.  Cardiovascular: RRR, no murmurs appreciated Respiratory: CTAB, no wheezes or rhonchi Abdomen: soft, non-tender, non-distended, +BS  Skin: Fluctuant tender non-erythematous lesion in R axilla Neuro: A&Ox3. No tremors. No focal deficits.   Brief Hospital Course:  Patient presented with worsening chest pressure, SOB, and stuttering. Patient also found to be hyperglycemia upon  admission.    Chest pressure Patient reported worsening chest pressure with associated dyspnea on exertion for the past two weeks. She denied radiation. She has a history of CHF, but denied similar episodes in the past.  i-State troponin was negative, and EKG on admission showed NSR with PVCs. Risk stratification labs were performed, which showed BNP and TSH WNL. Lipid panel was abnormal, though consistent with patient's history of HLD (for which she is already taking atorvastatin).  Repeat EKG on second day of admission showed NSR with no significant changes from initial EKG. Repeat troponins were negative. Echo was performed, which showed grade 1 diastolic dysfunction and decreased EF of 30-35%. Cardiology was consulted, who recommended diuresis with Lasix 40 mg.  Patient's symptoms did not return and vitals remained stable, so she was deemed stable for discharge after clearance by cardiology.   Hyperglycemia Patient has history of Type II DM, with recently worsening glycemic control (A1C on day of admission 10.8). CBG was 359 in ED. Patient was placed on metformin and Glyburide as well as sliding scale insulin on arrival, with subsequent improvement in CBGs. She was continued on these medications at discharge.   Stuttering Patient reported stuttering for three days prior to presentation. She denied any prior episodes of stuttering. Her symptoms improved initially, and it was thought that her hyperglycemia was potentially contributing to these symptoms. MRI head was performed to rule out CVA, which showed no acute abnormalities. Patient continued to stutter intermittently, and also displayed intermittent tremulous hand motions at rest throughout admission, despite improvement in blood glucose. Her symptoms were consistent with those of early Parkinson's disease, so it was thought that this was more likely due to neurodegenerative process. Patient will follow-up with neurology  outpatient for further  work-up and treatment if indicated.   R axillary furuncle Noted during admission. Drainage was not indicated, however patient was started on doxycycline.   Issues for Follow Up:  1. Patient started on glyburide 5 mg and metformin 500 mg BID for better glycemic control.  2. Patient discharged with 5 additional days of doxycycline to complete 7 day course of abx (last dose 01/14/16).  3. Referral placed to outpatient neurology for follow-up of stuttering and hand tremor.  4. Per cardiology recommendation, patient started on Lasix 20 mg daily at discharge.  5. As patient was started on Lasix, please check BMP at hosp f/u appt.   Significant Procedures: Echo (2/9)  Significant Labs and Imaging:   Recent Labs Lab 01/07/16 1040 01/07/16 1102 01/08/16 0351  WBC 7.1  --  6.9  HGB 11.0* 12.9 10.1*  HCT 35.0* 38.0 32.1*  PLT 247  --  241    Recent Labs Lab 01/07/16 1040 01/07/16 1102 01/08/16 0351 01/09/16 0540  NA 133* 133* 135 135  K 5.1 4.9 4.4 4.1  CL 96* 97* 102 100*  CO2 25  --  22 25  GLUCOSE 355* 359* 291* 211*  BUN 28* 35* 23* 26*  CREATININE 1.47* 1.30* 1.15* 1.41*  CALCIUM 9.5  --  8.8* 9.0  MG 2.0  --   --   --   ALKPHOS 166*  --   --   --   AST 27  --   --   --   ALT 44  --   --   --   ALBUMIN 3.3*  --   --   --     Echo (01/08/2016) - grade 1 diastolic dysfunction; EF [id number]  Results/Tests Pending at Time of Discharge: none  Discharge Medications:    Medication List    TAKE these medications        albuterol 108 (90 Base) MCG/ACT inhaler  Commonly known as:  PROVENTIL HFA;VENTOLIN HFA  Inhale 2 puffs into the lungs every 6 (six) hours as needed for wheezing or shortness of breath.     aspirin 81 MG tablet  Take 81 mg by mouth daily.     atorvastatin 40 MG tablet  Commonly known as:  LIPITOR  Take 1 tablet (40 mg total) by mouth daily.     benazepril 20 MG tablet  Commonly known as:  LOTENSIN  take 1 tablet by mouth once daily     colchicine  0.6 MG tablet  take 1 tablet by mouth twice a day     doxycycline 100 MG tablet  Commonly known as:  VIBRA-TABS  Take 1 tablet (100 mg total) by mouth 2 (two) times daily.     exemestane 25 MG tablet  Commonly known as:  AROMASIN  TAKE 1 TABLET BY MOUTH DAILY AFTER BREAKFAST     FLOVENT HFA 110 MCG/ACT inhaler  Generic drug:  fluticasone  inhale 2 puffs by mouth twice a day     furosemide 40 MG tablet  Commonly known as:  LASIX  Take 1 tablet (20 mg) daily.     gabapentin 100 MG capsule  Commonly known as:  NEURONTIN  4-5 tabs three times daily     glipiZIDE 5 MG tablet  Commonly known as:  GLUCOTROL  Please take 1pill (5mg ) with first meal of the day     HYDROcodone-acetaminophen 5-325 MG tablet  Commonly known as:  NORCO  Take 1-2 tablets by mouth every 6 (six)  hours as needed for moderate pain or severe pain.     metFORMIN 500 MG tablet  Commonly known as:  GLUCOPHAGE  Take 1 tablet (500 mg total) by mouth 2 (two) times daily with a meal.     metoprolol succinate 25 MG 24 hr tablet  Commonly known as:  TOPROL-XL  Take 1 tablet (25 mg total) by mouth daily.     potassium citrate 10 MEQ (1080 MG) SR tablet  Commonly known as:  UROCIT-K  TAKE 1 TABLET BY MOUTH 2 TIMES DAILY     ranitidine 150 MG tablet  Commonly known as:  ZANTAC  TAKE 1 TABLET BY MOUTH AT BEDTIME     spironolactone 25 MG tablet  Commonly known as:  ALDACTONE  take 1 tablet by mouth once daily     TYLENOL 8 HOUR ARTHRITIS PAIN 650 MG CR tablet  Generic drug:  acetaminophen  Take 650 mg by mouth every 8 (eight) hours as needed for pain.        Discharge Instructions: Please refer to Patient Instructions section of EMR for full details.  Patient was counseled important signs and symptoms that should prompt return to medical care, changes in medications, dietary instructions, activity restrictions, and follow up appointments.   Follow-Up Appointments: Follow-up Information    Follow up with  Lind Covert, MD. Go on 01/14/2016.   Specialty:  Family Medicine   Why:  Keep appt at 26am   Contact information:   [place] [address] [phone]       Follow up with [facility].   Why:   Physical Therapy   Contact information:   [place] [address] [phone]       Verner Mould, MD 01/09/2016, 10:55 AM PGY-1, [facility]  Updated discharge medications. Kerrin Mo, MD

## 2016-01-07 NOTE — Progress Notes (Signed)
   Subjective:    Patient ID: [name], female    DOB: June 08, 1951, 65 y.o.   MRN: [id number]  HPI  Dizziness Shaking  Present for follow up from visit last week with continued dizziness fatigue heavy feeling in her chest intermittent shaking of primarily R upper extremity and stuttering hard time getting words out.  No specific pain or shortness of breath or leg swelling or fall or vision changes   Chief Complaint noted Review of Symptoms - see HPI PMH - Smoking status noted.   Vital Signs reviewed   Review of Systems     Objective:   Physical Exam  Alert anxious knows me her previous doctor and her medications Intermittent coarse marked tremor of r hand and one 2 second episode of stuttering speech not associated with overall rigors or tremors Finger to nose intact Has persistent tremoring of right hand after checking RAM Lower Ext - normal strength  Eye - Pupils Equal Round Reactive to light, Extraocular movements intact, Fundi without hemorrhage or visible lesions, Conjunctiva without redness or discharge Heart - Regular rate and rhythm with freq missed beats.  No murmurs, gallops or rubs.    Lungs:  Normal respiratory effort, chest expands symmetrically. Lungs are clear to auscultation, no crackles or wheezes.  Pulse Ox - 98% room air  ECG - Freq PVCs without acute ST elevastions      Assessment & Plan:   Chest heaviness fatigue - need to rule out ACS  Intermittent R sided tremor and speech difficulty - need to rule out TIA  Also check lytes due to her chronic medications and recent uncontrolled diabetes   Gve check out to ER triage at 930 AM

## 2016-01-07 NOTE — ED Notes (Addendum)
IV attempted using [place] without success.

## 2016-01-07 NOTE — Care Management Obs Status (Signed)
[facility] NOTIFICATION   Patient Details  Name: [name] MRN: [id number] Date of Birth: 26-Jan-1951   Medicare Observation Status Notification Given:  Yes    Bethena Roys, RN 01/07/2016, 6:29 PM

## 2016-01-07 NOTE — ED Provider Notes (Signed)
CSN: [id number]     Arrival date & time 01/07/16  [id number] History   First MD Initiated Contact with Patient 01/07/16 1104     Chief Complaint  Patient presents with  . Stuttering  . Chest Pain     HPI Pt was seen at 1105. Per pt, c/o gradual onset and persistence of multiple intermittent episodes of chest "pain" for the past 1 week. Pt describes the CP as "pressure." Has been associated with SOB, nausea, and diaphoresis. Symptoms occur on exertion, improve with rest. Pt also c/o multiple intermittent episodes of "stuttering" for the past 1 week. Describes the stuttering as "having a hard time getting words out." Each episode lasts several minutes before spontaneously resolving. Pt was evaluated by her PMD PTA, then sent to the ED for further evaluation. Denies palpitations, no cough, no abd pain, no vomiting/diarrhea, no fevers, no focal motor weakness, no tingling/numbness in extremities.    Past Medical History  Diagnosis Date  . Hypertension   . Diabetes mellitus   . Depression   . Asthma   . Eczema   . Arthritis   . Cataract   . GERD (gastroesophageal reflux disease)   . History of kidney stones     w/ hx of hydronephrosis - followed by [facility]  . Anemia   . Obesity   . HIV nonspecific serology     2006: indeterminate HIV blood test, seen by ID, felt secondary to cross reacting antibodies with no further workup felt necessary at that time   . Fatty liver     Fatty infiltration of liver noted on 03/2012 CT scan  . CHF, acute ([place]) 05/03/2012  . Breast cancer of lower-outer quadrant of right female breast ([place]) 02/13/2015  . Radiation 05/07/15-06/23/15    Right Breast   Past Surgical History  Procedure Laterality Date  . Cystoscopy w/ litholapaxy / ehl    . Cholecystectomy  2003  . Replacement total knee bilateral  2005 &2006  . Joint replacement      bilateral knee replacement  . Vascular surgery Right 03/15/2013    Ultrasound guided sclerotherapy  . Left and right heart  catheterization with coronary angiogram N/A 05/02/2012    Procedure: LEFT AND RIGHT HEART CATHETERIZATION WITH CORONARY ANGIOGRAM;  Surgeon: Burnell Blanks, MD;  Location: [facility] CATH LAB;  Service: Cardiovascular;  Laterality: N/A;  . Radioactive seed guided mastectomy with axillary sentinel lymph node biopsy Right 03/21/2015    Procedure: RIGHT  PARTIAL MASTECTOMY WITH RADIOACTIVE SEED LOCALIZATION RIGHT  AXILLARY SENTINEL LYMPH NODE BIOPSY;  Surgeon: Fanny Skates, MD;  Location: [facility];  Service: General;  Laterality: Right;   Family History  Problem Relation Age of Onset  . Diabetes Mother   . Stroke Mother   . Heart disease Father   . Anemia Father   . Cancer Sister 17    nose cancer   . Cancer Cousin 30    ovarian cancer   . Cancer Cousin 35    ovarian cancer    Social History  Substance Use Topics  . Smoking status: Never Smoker   . Smokeless tobacco: Never Used  . Alcohol Use: No    Review of Systems ROS: Statement: All systems negative except as marked or noted in the HPI; Constitutional: Negative for fever and chills. ; ; Eyes: Negative for eye pain, redness and discharge. ; ; ENMT: Negative for ear pain, hoarseness, nasal congestion, sinus pressure and sore throat. ; ; Cardiovascular: +CP, SOB,  diaphoresis. Negative for palpitations, and peripheral edema. ; ; Respiratory: Negative for cough, wheezing and stridor. ; ; Gastrointestinal: +nausea. Negative for vomiting, diarrhea, abdominal pain, blood in stool, hematemesis, jaundice and rectal bleeding. . ; ; Genitourinary: Negative for dysuria, flank pain and hematuria. ; ; Musculoskeletal: Negative for back pain and neck pain. Negative for swelling and trauma.; ; Skin: Negative for pruritus, rash, abrasions, blisters, bruising and skin lesion.; ; Neuro: +"stuttering." Negative for headache, lightheadedness and neck stiffness. Negative for weakness, altered level of consciousness , altered mental status,  extremity weakness, paresthesias, involuntary movement, seizure and syncope.      Allergies  Review of patient's allergies indicates no known allergies.  Home Medications   Prior to Admission medications   Medication Sig Start Date End Date Taking? Authorizing Provider  acetaminophen (TYLENOL 8 HOUR ARTHRITIS PAIN) 650 MG CR tablet Take 650 mg by mouth every 8 (eight) hours as needed for pain.    Historical Provider, MD  albuterol (PROVENTIL HFA;VENTOLIN HFA) 108 (90 BASE) MCG/ACT inhaler Inhale 2 puffs into the lungs every 6 (six) hours as needed for wheezing or shortness of breath.    Historical Provider, MD  aspirin 81 MG tablet Take 81 mg by mouth daily.    Historical Provider, MD  atorvastatin (LIPITOR) 40 MG tablet Take 1 tablet (40 mg total) by mouth daily. Patient not taking: Reported on 01/07/2016 08/14/15   Lind Covert, MD  benazepril (LOTENSIN) 20 MG tablet take 1 tablet by mouth once daily 07/02/15   Lind Covert, MD  colchicine 0.6 MG tablet take 1 tablet by mouth twice a day 09/19/15   Lind Covert, MD  exemestane (AROMASIN) 25 MG tablet TAKE 1 TABLET BY MOUTH DAILY AFTER BREAKFAST 12/30/15   Truitt Merle, MD  FLOVENT [facility] 110 MCG/ACT inhaler inhale 2 puffs by mouth twice a day 12/30/14   Alveda Reasons, MD  furosemide (LASIX) 40 MG tablet TAKE 1 TABLET BY MOUTH ONCE DAILY 09/24/15   Lind Covert, MD  gabapentin (NEURONTIN) 100 MG capsule 4-5 tabs three times daily 12/11/15   Lind Covert, MD  HYDROcodone-acetaminophen (NORCO) 5-325 MG tablet Take 1-2 tablets by mouth every 6 (six) hours as needed for moderate pain or severe pain. 10/08/15   Lind Covert, MD  metoprolol succinate (TOPROL-XL) 25 MG 24 hr tablet Take 1 tablet (25 mg total) by mouth daily. 08/13/15   Lind Covert, MD  potassium citrate (UROCIT-K) 10 MEQ (1080 MG) SR tablet TAKE 1 TABLET BY MOUTH 2 TIMES DAILY 09/19/15   Lind Covert, MD  ranitidine (ZANTAC) 150 MG  tablet TAKE 1 TABLET BY MOUTH AT BEDTIME 08/25/15   Lind Covert, MD  spironolactone (ALDACTONE) 25 MG tablet take 1 tablet by mouth once daily 11/26/15   Lind Covert, MD  trolamine salicylate (ASPERCREME) 10 % cream Apply 1 application topically as needed for muscle pain.     Historical Provider, MD   BP 141/76 mmHg  Pulse 50  Temp(Src) 97.5 F (36.4 C) (Oral)  Resp 16  SpO2 100% Physical Exam  1110: Physical examination:  Nursing notes reviewed; Vital signs and O2 SAT reviewed;  Constitutional: Well developed, Well nourished, Well hydrated, In no acute distress; Head:  Normocephalic, atraumatic; Eyes: EOMI, PERRL, No scleral icterus; ENMT: Mouth and pharynx normal, Mucous membranes moist; Neck: Supple, Full range of motion, No lymphadenopathy; Cardiovascular: Regular rate and rhythm, No gallop; Respiratory: Breath sounds clear & equal bilaterally, No wheezes.  Speaking full sentences with ease, Normal respiratory effort/excursion; Chest: Nontender, Movement normal; Abdomen: Soft, Nontender, Nondistended, Normal bowel sounds; Genitourinary: No CVA tenderness; Extremities: Pulses normal, No tenderness, +1 pedal edema bilat. No calf asymmetry.; Neuro: AA&Ox3, Major CN grossly intact. Speech clear.  No facial droop.  No nystagmus. Grips equal. Strength 5/5 equal bilat UE's and LE's.  DTR 2/4 equal bilat UE's and LE's.  No gross sensory deficits.  Normal cerebellar testing bilat UE's (finger-nose) and LE's (heel-shin)..; Skin: Color normal, Warm, Dry.   ED Course  Procedures (including critical care time) Labs Review  Imaging Review  I have personally reviewed and evaluated these images and lab results as part of my medical decision-making.   EKG Interpretation   Date/Time:  Wednesday January 07 2016 10:21:44 EST Ventricular Rate:  84 PR Interval:  136 QRS Duration: 92 QT Interval:  378 QTC Calculation: 446 R Axis:   35 Text Interpretation:  Sinus rhythm with frequent  Premature ventricular  complexes When compared with ECG of 01/02/2016 No significant change was  found Confirmed by [facility]  MD, Nunzio Cory [phone]) on 01/07/2016 12:56:36 PM      MDM  MDM Reviewed: previous chart, nursing note and vitals Reviewed previous: labs and ECG Interpretation: labs, ECG, x-ray and MRI     Results for orders placed or performed during the hospital encounter of 01/07/16  Protime-INR  Result Value Ref Range   Prothrombin Time 13.7 11.6 - 15.2 seconds   INR 1.03 0.00 - 1.49  APTT  Result Value Ref Range   aPTT 28 24 - 37 seconds  CBC  Result Value Ref Range   WBC 7.1 4.0 - 10.5 K/uL   RBC 3.82 (L) 3.87 - 5.11 MIL/uL   Hemoglobin 11.0 (L) 12.0 - 15.0 g/dL   HCT 35.0 (L) 36.0 - 46.0 %   MCV 91.6 78.0 - 100.0 fL   MCH 28.8 26.0 - 34.0 pg   MCHC 31.4 30.0 - 36.0 g/dL   RDW 13.5 11.5 - 15.5 %   Platelets 247 150 - 400 K/uL  Differential  Result Value Ref Range   Neutrophils Relative % 61 %   Neutro Abs 4.4 1.7 - 7.7 K/uL   Lymphocytes Relative 30 %   Lymphs Abs 2.1 0.7 - 4.0 K/uL   Monocytes Relative 8 %   Monocytes Absolute 0.6 0.1 - 1.0 K/uL   Eosinophils Relative 1 %   Eosinophils Absolute 0.1 0.0 - 0.7 K/uL   Basophils Relative 0 %   Basophils Absolute 0.0 0.0 - 0.1 K/uL  Comprehensive metabolic panel  Result Value Ref Range   Sodium 133 (L) 135 - 145 mmol/L   Potassium 5.1 3.5 - 5.1 mmol/L   Chloride 96 (L) 101 - 111 mmol/L   CO2 25 22 - 32 mmol/L   Glucose, Bld 355 (H) 65 - 99 mg/dL   BUN 28 (H) 6 - 20 mg/dL   Creatinine, Ser 1.47 (H) 0.44 - 1.00 mg/dL   Calcium 9.5 8.9 - 10.3 mg/dL   Total Protein 7.1 6.5 - 8.1 g/dL   Albumin 3.3 (L) 3.5 - 5.0 g/dL   AST 27 15 - 41 U/L   ALT 44 14 - 54 U/L   Alkaline Phosphatase 166 (H) 38 - 126 U/L   Total Bilirubin 1.3 (H) 0.3 - 1.2 mg/dL   GFR calc non Af Amer 36 (L) >60 mL/min   GFR calc Af Amer 42 (L) >60 mL/min   Anion gap 12 5 - 15  Magnesium  Result Value Ref Range   Magnesium 2.0 1.7 - 2.4  mg/dL  Urinalysis, Routine w reflex microscopic  Result Value Ref Range   Color, Urine YELLOW YELLOW   APPearance CLEAR CLEAR   Specific Gravity, Urine 1.011 1.005 - 1.030   pH 5.0 5.0 - 8.0   Glucose, UA >1000 (A) NEGATIVE mg/dL   Hgb urine dipstick NEGATIVE NEGATIVE   Bilirubin Urine NEGATIVE NEGATIVE   Ketones, ur NEGATIVE NEGATIVE mg/dL   Protein, ur NEGATIVE NEGATIVE mg/dL   Nitrite NEGATIVE NEGATIVE   Leukocytes, UA NEGATIVE NEGATIVE  Urine microscopic-add on  Result Value Ref Range   Squamous Epithelial / LPF 0-5 (A) NONE SEEN   WBC, UA 0-5 0 - 5 WBC/hpf   RBC / HPF NONE SEEN 0 - 5 RBC/hpf   Bacteria, UA RARE (A) NONE SEEN  I-stat troponin, ED (not at [facility], [facility])  Result Value Ref Range   Troponin i, poc 0.00 0.00 - 0.08 ng/mL   Comment 3          I-Stat Chem 8, ED  (not at [facility], [facility])  Result Value Ref Range   Sodium 133 (L) 135 - 145 mmol/L   Potassium 4.9 3.5 - 5.1 mmol/L   Chloride 97 (L) 101 - 111 mmol/L   BUN 35 (H) 6 - 20 mg/dL   Creatinine, Ser 1.30 (H) 0.44 - 1.00 mg/dL   Glucose, Bld 359 (H) 65 - 99 mg/dL   Calcium, Ion 1.18 1.13 - 1.30 mmol/L   TCO2 28 0 - 100 mmol/L   Hemoglobin 12.9 12.0 - 15.0 g/dL   HCT 38.0 36.0 - 46.0 %   Dg Chest 2 View 01/07/2016  CLINICAL DATA:  Chest pain for 1 week EXAM: CHEST  2 VIEW COMPARISON:  March 18, 2015 chest radiograph and chest CT June 03, 2014 FINDINGS: There is no edema or consolidation. The heart size and pulmonary vascularity are normal. No adenopathy. There is postoperative change in the right breast region. There is a large anterior osteophyte in the mid thoracic region, well seen on prior CT and not appreciably changed. There is borderline acromioclavicular separation on the right. IMPRESSION: No edema or consolidation. Borderline acromioclavicular separation on the right. Electronically Signed   By: Lowella Grip III M.D.   On: 01/07/2016 13:34   Mr Brain Wo Contrast (neuro Protocol) 01/07/2016  CLINICAL DATA:   Intermittent speech stuttering. Headache. Chest pressure. Symptoms for 1 week. EXAM: MRI HEAD WITHOUT CONTRAST TECHNIQUE: Multiplanar, multiecho pulse sequences of the brain and surrounding structures were obtained without intravenous contrast. COMPARISON:  06/03/2014 FINDINGS: There is no evidence of acute infarct, intracranial hemorrhage, mass, midline shift, or extra-axial fluid collection. Ventricles and sulci are normal. T2 hyperintensities in the periventricular greater than subcortical cerebral white matter bilaterally are unchanged and nonspecific but compatible with mild chronic small vessel ischemic disease. Orbits are unremarkable. Bilateral maxillary sinus mucous retention cysts are noted. Mastoid air cells are clear. Major intracranial vascular flow voids are preserved. IMPRESSION: 1. No acute intracranial abnormality. 2. Mild chronic small vessel ischemic disease, unchanged. Electronically Signed   By: Logan Bores M.D.   On: 01/07/2016 13:11    1330:  MRI negative for acute stroke. BUN/Cr per baseline. CBG elevated per hx, AG 12; IVF given. Dx and testing d/w pt.  Questions answered.  Verb understanding, agreeable to observation admit.  T/C to [facility] Resident, case discussed, including:  HPI, pertinent PM/SHx, VS/PE, dx testing, ED course and treatment:  Agreeable to admit, requests to write temporary orders, obtain observation tele bed to Dr. Macario Golds service.   Francine Graven, DO 01/11/16 2115

## 2016-01-08 ENCOUNTER — Observation Stay (HOSPITAL_BASED_OUTPATIENT_CLINIC_OR_DEPARTMENT_OTHER): Payer: Medicare Other

## 2016-01-08 ENCOUNTER — Other Ambulatory Visit: Payer: Self-pay

## 2016-01-08 DIAGNOSIS — I10 Essential (primary) hypertension: Secondary | ICD-10-CM | POA: Diagnosis not present

## 2016-01-08 DIAGNOSIS — R079 Chest pain, unspecified: Secondary | ICD-10-CM | POA: Diagnosis not present

## 2016-01-08 DIAGNOSIS — R110 Nausea: Secondary | ICD-10-CM | POA: Diagnosis not present

## 2016-01-08 DIAGNOSIS — R0789 Other chest pain: Secondary | ICD-10-CM | POA: Diagnosis not present

## 2016-01-08 DIAGNOSIS — G459 Transient cerebral ischemic attack, unspecified: Secondary | ICD-10-CM | POA: Diagnosis not present

## 2016-01-08 DIAGNOSIS — R0600 Dyspnea, unspecified: Secondary | ICD-10-CM

## 2016-01-08 DIAGNOSIS — R739 Hyperglycemia, unspecified: Secondary | ICD-10-CM | POA: Diagnosis not present

## 2016-01-08 DIAGNOSIS — I5023 Acute on chronic systolic (congestive) heart failure: Secondary | ICD-10-CM

## 2016-01-08 DIAGNOSIS — F8081 Childhood onset fluency disorder: Secondary | ICD-10-CM | POA: Diagnosis not present

## 2016-01-08 LAB — GLUCOSE, CAPILLARY
Glucose-Capillary: 270 mg/dL — ABNORMAL HIGH (ref 65–99)
Glucose-Capillary: 282 mg/dL — ABNORMAL HIGH (ref 65–99)
Glucose-Capillary: 208 mg/dL — ABNORMAL HIGH (ref 65–99)
Glucose-Capillary: 176 mg/dL — ABNORMAL HIGH (ref 65–99)

## 2016-01-08 LAB — BASIC METABOLIC PANEL
Anion gap: 11 (ref 5–15)
BUN: 23 mg/dL — ABNORMAL HIGH (ref 6–20)
CALCIUM: 8.8 mg/dL — AB (ref 8.9–10.3)
CHLORIDE: 102 mmol/L (ref 101–111)
CO2: 22 mmol/L (ref 22–32)
CREATININE: 1.15 mg/dL — AB (ref 0.44–1.00)
GFR calc non Af Amer: 48 mL/min — ABNORMAL LOW (ref >60)
GFR, EST AFRICAN AMERICAN: 56 mL/min — AB (ref >60)
GLUCOSE: 291 mg/dL — AB (ref 65–99)
Potassium: 4.4 mmol/L (ref 3.5–5.1)
Sodium: 135 mmol/L (ref 135–145)

## 2016-01-08 LAB — CBC
HCT: 32.1 % — ABNORMAL LOW (ref 36.0–46.0)
HEMOGLOBIN: 10.1 g/dL — AB (ref 12.0–15.0)
MCH: 28.6 pg (ref 26.0–34.0)
MCHC: 31.5 g/dL (ref 30.0–36.0)
MCV: 90.9 fL (ref 78.0–100.0)
Platelets: 241 10*3/uL (ref 150–400)
RBC: 3.53 MIL/uL — AB (ref 3.87–5.11)
RDW: 13.6 % (ref 11.5–15.5)
WBC: 6.9 10*3/uL (ref 4.0–10.5)

## 2016-01-08 LAB — URINE CULTURE

## 2016-01-08 LAB — TROPONIN I: Troponin I: <0.03 ng/mL (ref <0.031)

## 2016-01-08 MED ORDER — METFORMIN HCL 500 MG PO TABS
500.0000 mg | ORAL_TABLET | Freq: Two times a day (BID) | ORAL | Status: DC
  Administered 2016-01-09: 500 mg via ORAL
  Filled 2016-01-08: qty 1

## 2016-01-08 MED ORDER — DOXYCYCLINE HYCLATE 100 MG PO TABS
100.0000 mg | ORAL_TABLET | Freq: Two times a day (BID) | ORAL | Status: DC
  Administered 2016-01-08 – 2016-01-09 (×2): 100 mg via ORAL
  Filled 2016-01-08 (×2): qty 1

## 2016-01-08 MED ORDER — METFORMIN HCL 500 MG PO TABS: 500.0000 mg | ORAL_TABLET | Freq: Two times a day (BID) | ORAL | Status: DC

## 2016-01-08 MED ORDER — PERFLUTREN LIPID MICROSPHERE
1.0000 mL | INTRAVENOUS | Status: AC | PRN
Start: 2016-01-08 — End: 2016-01-08
  Administered 2016-01-08: 3 mL via INTRAVENOUS
  Filled 2016-01-08: qty 10

## 2016-01-08 MED ORDER — FUROSEMIDE 10 MG/ML IJ SOLN
40.0000 mg | Freq: Two times a day (BID) | INTRAMUSCULAR | Status: DC
  Administered 2016-01-08: 40 mg via INTRAVENOUS
  Filled 2016-01-08 (×2): qty 4

## 2016-01-08 NOTE — Progress Notes (Signed)
  Echocardiogram 2D Echocardiogram has been performed.  [name] 01/08/2016, 10:38 AM

## 2016-01-08 NOTE — Progress Notes (Signed)
Family Medicine Teaching Service Daily Progress Note Intern Pager: [phone]  Patient name: [name] Medical record number: [id number] Date of birth: 12/18/1950 Age: 65 y.o. Gender: female  Primary Care Provider: Lind Covert, MD Consultants: Cardiology Code Status: FULL  Pt Overview and Major Events to Date:  2/8 - admitted for ACS rule out 2/9 - echo performed (EF decreased to 30-35%). Doxy started for furuncle.   Assessment and Plan: JASNOOR KIN is a 65 y.o. female presenting with worsening chest pressure with shortness of breath with exertion as well as worsening stuttering with hyperglycemia. PMH is significant for type 2 diabetes, breast cancer, systolic CHF, mild persistent asthma, depression, HTN, and DJD.   # Chest Pressure: History of Systolic CHF reported, however last Echo in 06/2014 with EF 55-60% and normal systolic function. Echo from 2013 with EF 20-25% with severely reduced systolic function and grade II diastolic dysfunction. Prescribed Aspirin 81mg  and Lasix 40mg . i-Stat troponin negative.EKG on admission NSR with PVCs. At baseline weight. BNP, TSH WNL. Repeat troponins neg. Echo yesterday showed EF of 30-35% with grade 1 diastolic dysfunction. Per cardiology, patient's clinical picture most consistent with non-ischemic cardiomyopathy, and recommended beginning 20 mg Lasix to current med regimen.  - Cardiac Monitoring - Daily weights, I&Os - Cardiology consulted - cleared for discharge today  # Diabetes: Glucose elevated to 359 at admission. A1C elevated to 10.8. Prescribed Gabapentin 100mg  for Neuropathy. CBGs 200, 176 overnight. - Monitor CBGs - Sensitive Insulin Sliding Scale - Cont Glyburide 5mg ; continue at discharge - Initiated metformin 500mg  BID; continue at discharge  # Stuttering: Initially thought possibly due to hyperglycemia, however stuttering persists despite improvement in blood glucose. Stuttering, along with patient's intermittent  tremor, may be early sign of Parkinson's disease, so this may be due to neurodegenerative process. MRI brain on admission showed no acute abnormalities. No stuttering since yesterday.  - F/u with neurology outpatient  # R axilla furuncle - Continue doxycycline 100 mg BID for 5-7 days - Drainage not indicated at this time  # Chronic Kidney Disease: Creatinine 1.30 at admission. Baseline 1.3. Cr increased to 1.41 today.  - Continue to monitor.   # GERD: Prescribed Ranitidine 150mg . - Pepcid 20mg   # HTN: Prescribed Benazepril 20mg  daily, Metoprolol 25mg  daily, Spironolactone 25mg . BP 105/75 overnight.  - Continue home medications - Continue to monitor. Monitor HR and consider holding Metoprolol if bradycardic again.  # HLD: Prescribed Atorvastatin 40mg  daily. Elevated total cholesterol and triglycerides, with decreased HDL (2/8). - Continue home medication  # Mild Persistent Asthma: Prescribed Albuterol inhaler PRN wheezing and Flovent BID. - Continue home medications  # DJD: Prescribed Tylenol and Norco PRN pain. - Continue home pain medication.  # Breast Cancer: Prescribed Exemestane.  - Continue home medication  FEN/GI: Heart Healthy/Carb Modified Diet, Pepcid Prophylaxis: subQ heparin  Disposition: home pending medical improvement  Subjective:  Patient has no complaints this AM. Denies any stuttering or hand tremor since early yesterday. Some pain at site of furuncle under air, but no chest pain.   Objective: Temp:  [97.3 F (36.3 C)-97.9 F (36.6 C)] 97.9 F (36.6 C) (02/10 0348) Pulse Rate:  [75-81] 81 (02/10 0348) Resp:  [17-18] 18 (02/10 0348) BP: (105)/(75) 105/75 mmHg (02/09 1947) SpO2:  [98 %-100 %] 100 % (02/10 0348) Weight:  [214 lb (97.07 kg)] 214 lb (97.07 kg) (02/10 0348) Physical Exam: General: sitting in bedside chair in NAD. Daughter at bedside. Speaking clearly with no stuttering.  Cardiovascular: RRR, no  murmurs appreciated Respiratory: CTAB, no  wheezes or rhonchi Abdomen: soft, non-tender, non-distended, +BS  Skin: Fluctuant tender non-erythematous lesion in R axilla Neuro: A&Ox3. No tremors. No focal deficits.   Laboratory:  Recent Labs Lab 01/07/16 1040 01/07/16 1102 01/08/16 0351  WBC 7.1  --  6.9  HGB 11.0* 12.9 10.1*  HCT 35.0* 38.0 32.1*  PLT 247  --  241    Recent Labs Lab 01/07/16 1040 01/07/16 1102 01/08/16 0351 01/09/16 0540  NA 133* 133* 135 135  K 5.1 4.9 4.4 4.1  CL 96* 97* 102 100*  CO2 25  --  22 25  BUN 28* 35* 23* 26*  CREATININE 1.47* 1.30* 1.15* 1.41*  CALCIUM 9.5  --  8.8* 9.0  PROT 7.1  --   --   --   BILITOT 1.3*  --   --   --   ALKPHOS 166*  --   --   --   ALT 44  --   --   --   AST 27  --   --   --   GLUCOSE 355* 359* 291* 211*    Imaging/Diagnostic Tests:  01/07/2016 CHEST  2 VIEW  IMPRESSION: No edema or consolidation. Borderline acromioclavicular separation on the right.   01/07/2016 MRI HEAD WITHOUT CONTRAST TECHNIQUE:   IMPRESSION: 1. No acute intracranial abnormality. 2. Mild chronic small vessel ischemic disease, unchanged.    Verner Mould, MD 01/09/2016, 9:22 AM PGY-1, [place] Intern pager: [phone], text pages welcome

## 2016-01-08 NOTE — Consult Note (Signed)
CARDIOLOGY CONSULT NOTE   Patient ID: [name] MRN: [id number] DOB/AGE: 1951-06-29 65 y.o.  Admit date: 01/07/2016  Primary Physician   Lind Covert, MD Primary Cardiologist Dr. Percival Spanish (last seen 2010) Reason for Consultation  Chest pain Requesting Physician Dr. Gwendlyn Deutscher  HPI: [name] is a 65 y.o. female with a history of HTN, DM,  NICM now improved EF, breast cancer status post lumpectomy and radiation - currently on exemestane, asthma, GERD, depression who sent  to [facility] by PCP for evaluation of chest pain.   She saw Dr. Percival Spanish in 2010 for CP with stress test recommended at that time but we have no records of report or f/u - she thinks she had stress test but isn't sure. The patient was admitted May/June 2013 for dyspnea. Work up showed new-onset CHF with EF 20-25% and grade II diastolic on echo. F/u cath 05/02/12 showed normal coronaries. Never seen in clinic afterwards as PCP was managing care. Last echo 07/22/14 showed LV EF of 55-60%, no wm abnormality and resolved DD.   For the past 2-3 weeks patient has being having substernal chest pressure and shortness of breath with exertion. The patient denies associated diaphoresis, however admits to having intermittent nausea. No radiation of pain. The pain resolved with rest in 15-20 minutes. The patient uses 2 pillows every night however denies orthopnea, PND or lower extremity edema. Admits to having intermittent dizziness with standing. Patient also admits to having intermittent hand tremors without any weakness or numbness. The patient denies vomiting, blood in her stool or urine. Denies use of tobacco abuse or illicit drug use. Father has a some kind of heart problem, unable to provide further information. She was previously on metformin however discontinued by PCP recent hemoglobin A1c check was 10.8.   EKG shows sinus rhythm with PVCs. Telemetry shows sinus rhythm with PVCs and ventricular bigeminy.  Troponin negative. TSH normal. BNP within normal limits. Urinalysis shows glucose more than 1000. Triglycerides early high at 437. Hemoglobin of 10.1. Chest x-ray without acute cardiopulmonary disease, shows borderline AC separation to the right.  MRI of the brain without acute abnormality, mild chronic small vessel disease unchanged.  Past Medical History  Diagnosis Date  . Hypertension   . Diabetes mellitus   . Depression   . Asthma   . Eczema   . Arthritis   . Cataract   . GERD (gastroesophageal reflux disease)   . History of kidney stones     w/ hx of hydronephrosis - followed by [facility]  . Anemia   . Obesity   . HIV nonspecific serology     2006: indeterminate HIV blood test, seen by ID, felt secondary to cross reacting antibodies with no further workup felt necessary at that time   . Fatty liver     Fatty infiltration of liver noted on 03/2012 CT scan  . CHF, acute ([place]) 05/03/2012  . Breast cancer of lower-outer quadrant of right female breast ([place]) 02/13/2015  . Radiation 05/07/15-06/23/15    Right Breast     Past Surgical History  Procedure Laterality Date  . Cystoscopy w/ litholapaxy / ehl    . Cholecystectomy  2003  . Replacement total knee bilateral  2005 &2006  . Joint replacement      bilateral knee replacement  . Vascular surgery Right 03/15/2013    Ultrasound guided sclerotherapy  . Left and right heart catheterization with coronary angiogram N/A 05/02/2012    Procedure: LEFT AND RIGHT  HEART CATHETERIZATION WITH CORONARY ANGIOGRAM;  Surgeon: Burnell Blanks, MD;  Location[facility] CATH LAB;  Service: Cardiovascular;  Laterality: N/A;  . Radioactive seed guided mastectomy with axillary sentinel lymph node biopsy Right 03/21/2015    Procedure: RIGHT  PARTIAL MASTECTOMY WITH RADIOACTIVE SEED LOCALIZATION RIGHT  AXILLARY SENTINEL LYMPH NODE BIOPSY;  Surgeon: Fanny Skates, MD;  Location: [facility];  Service: General;  Laterality: Right;    No  Known Allergies  I have reviewed the patient's current medications . aspirin  81 mg Oral Daily  . atorvastatin  40 mg Oral q1800  . benazepril  20 mg Oral Daily  . budesonide  0.25 mg Inhalation BID  . exemestane  25 mg Oral QPC breakfast  . famotidine  20 mg Oral Daily  . furosemide  40 mg Oral Daily  . gabapentin  400 mg Oral TID  . glyBURIDE  5 mg Oral Q breakfast  . heparin  5,000 Units Subcutaneous 3 times per day  . insulin aspart  0-9 Units Subcutaneous TID WC  . metoprolol succinate  25 mg Oral Daily  . sodium chloride flush  3 mL Intravenous Q12H  . spironolactone  25 mg Oral Daily     acetaminophen **OR** acetaminophen, albuterol, HYDROcodone-acetaminophen  Prior to Admission medications   Medication Sig Start Date End Date Taking? Authorizing Provider  acetaminophen (TYLENOL 8 HOUR ARTHRITIS PAIN) 650 MG CR tablet Take 650 mg by mouth every 8 (eight) hours as needed for pain.   Yes Historical Provider, MD  albuterol (PROVENTIL HFA;VENTOLIN HFA) 108 (90 BASE) MCG/ACT inhaler Inhale 2 puffs into the lungs every 6 (six) hours as needed for wheezing or shortness of breath.   Yes Historical Provider, MD  aspirin 81 MG tablet Take 81 mg by mouth daily.   Yes Historical Provider, MD  benazepril (LOTENSIN) 20 MG tablet take 1 tablet by mouth once daily 07/02/15  Yes Lind Covert, MD  colchicine 0.6 MG tablet take 1 tablet by mouth twice a day 09/19/15  Yes Lind Covert, MD  exemestane (AROMASIN) 25 MG tablet TAKE 1 TABLET BY MOUTH DAILY AFTER BREAKFAST 12/30/15  Yes Truitt Merle, MD  FLOVENT [facility] 110 MCG/ACT inhaler inhale 2 puffs by mouth twice a day 12/30/14  Yes Alveda Reasons, MD  furosemide (LASIX) 40 MG tablet TAKE 1 TABLET BY MOUTH ONCE DAILY 09/24/15  Yes Lind Covert, MD  gabapentin (NEURONTIN) 100 MG capsule 4-5 tabs three times daily Patient taking differently: Take 400-500 mg by mouth 3 (three) times daily. 4-5 tabs three times daily 12/11/15  Yes  Lind Covert, MD  HYDROcodone-acetaminophen (NORCO) 5-325 MG tablet Take 1-2 tablets by mouth every 6 (six) hours as needed for moderate pain or severe pain. 10/08/15  Yes Lind Covert, MD  metoprolol succinate (TOPROL-XL) 25 MG 24 hr tablet Take 1 tablet (25 mg total) by mouth daily. 08/13/15  Yes Lind Covert, MD  potassium citrate (UROCIT-K) 10 MEQ (1080 MG) SR tablet TAKE 1 TABLET BY MOUTH 2 TIMES DAILY 09/19/15  Yes Lind Covert, MD  ranitidine (ZANTAC) 150 MG tablet TAKE 1 TABLET BY MOUTH AT BEDTIME 08/25/15  Yes Lind Covert, MD  spironolactone (ALDACTONE) 25 MG tablet take 1 tablet by mouth once daily 11/26/15  Yes Lind Covert, MD  atorvastatin (LIPITOR) 40 MG tablet Take 1 tablet (40 mg total) by mouth daily. Patient not taking: Reported on 01/07/2016 08/14/15   Lind Covert, MD  Social History   Social History  . Marital Status: Widowed    Spouse Name: [name]  . Number of Children: 2  . Years of Education: 14   Occupational History  . retired-CNA, housekeeping    Social History Main Topics  . Smoking status: Never Smoker   . Smokeless tobacco: Never Used  . Alcohol Use: No  . Drug Use: No  . Sexual Activity: No   Other Topics Concern  . Not on file   Social History Narrative   Health Care POA:    Emergency Contact: son, Kerilee Rzepecki [phone]   End of Life Plan:    Who lives with you:  Daughter- [name] and granddaughter- [name]   Any pets: dog   Diet: Pt has a varied diet.  Reports eating 2 meals a day.    Exercise: walks daily 15-20 mins   Seatbelts: Pt reports wearing seatbelt when in vehicles.    Hobbies: word searches, church, time with family and friends, cooking, walking             Family Status  Relation Status Death Age  . Mother Deceased   . Father Deceased   . Sister Alive   . Brother Alive    Family History  Problem Relation Age of Onset  . Diabetes Mother   . Stroke Mother   .  Heart disease Father   . Anemia Father   . Cancer Sister 17    nose cancer   . Cancer Cousin 30    ovarian cancer   . Cancer Cousin 49    ovarian cancer       ROS:  Full 14 point review of systems complete and found to be negative unless listed above.  Physical Exam: Blood pressure 101/67, pulse 79, temperature 98.4 F (36.9 C), temperature source Oral, resp. rate 19, height 5\' 2"  (1.575 m), weight 215 lb 3.2 oz (97.614 kg), SpO2 99 %.  General: Well developed, well nourished, female in no acute distress Head: Eyes PERRLA, No xanthomas. Normocephalic and atraumatic, oropharynx without edema or exudate.  Lungs: Resp regular and unlabored, CTA. Heart: RRR no s3, s4, or murmurs..   Neck: No carotid bruits. No lymphadenopathy.  No JVD. Abdomen: Bowel sounds present, abdomen soft and non-tender without masses or hernias noted. Msk:  No spine or cva tenderness. No weakness, no joint deformities or effusions. Extremities: No clubbing, cyanosis or edema. DP/PT/Radials 2+ and equal bilaterally. Neuro: Alert and oriented X 3. No focal deficits noted. Psych:  Good affect, responds appropriately Skin: No rashes or lesions noted.  Labs:   Lab Results  Component Value Date   WBC 6.9 01/08/2016   HGB 10.1* 01/08/2016   HCT 32.1* 01/08/2016   MCV 90.9 01/08/2016   PLT 241 01/08/2016    Recent Labs  01/07/16 1040  INR 1.03    Recent Labs Lab 01/07/16 1040  01/08/16 0351  NA 133*  < > 135  K 5.1  < > 4.4  CL 96*  < > 102  CO2 25  --  22  BUN 28*  < > 23*  CREATININE 1.47*  < > 1.15*  CALCIUM 9.5  --  8.8*  PROT 7.1  --   --   BILITOT 1.3*  --   --   ALKPHOS 166*  --   --   ALT 44  --   --   AST 27  --   --   GLUCOSE 355*  < > 291*  ALBUMIN 3.3*  --   --   < > = values in this interval not displayed. MAGNESIUM  Date Value Ref Range Status  01/07/2016 2.0 1.7 - 2.4 mg/dL Final    Recent Labs  01/07/16 1635 01/07/16 2213 01/08/16 0351  TROPONINI <0.03 <0.03 <0.03     Recent Labs  01/07/16 1101  TROPIPOC 0.00    Echo: 07/22/14 LV EF: 55% -  60%  ------------------------------------------------------------------- Indications:   CHF - 428.0.  ------------------------------------------------------------------- History:  Risk factors: Hypertension. Diabetes mellitus.  ------------------------------------------------------------------- Study Conclusions  - Left ventricle: The cavity size was normal. Systolic function was normal. The estimated ejection fraction was in the range of 55% to 60%. Wall motion was normal; there were no regional wall motion abnormalities. - Aortic valve: There was trivial regurgitation. - Mitral valve: There was trivial regurgitation.  ECG:  * Vent. rate 84 BPM PR interval 136 ms QRS duration 92 ms QT/QTc 378/446 ms P-R-T axes 57 35 64   Cath 05/02/12 Hemodynamic Findings: Ao: 149/100  LV: 148/21/26 RA: 16  RV: 56/17/20 PA: 59/35 (mean 46)  PCWP: 31 Fick Cardiac Output: 3.54 L/min Fick Cardiac Index: 1.74 L/min/m2 Central Aortic Saturation: 99% Pulmonary Artery Saturation: 53%   Angiographic Findings:  Left main: No evidence of disease.  Left Anterior Descending Artery: Large vessel that courses to the apex. There is a moderate sized diagonal branch. No evidence of obstructive disease.  Circumflex Artery: Large caliber vessel that gives off a large ramus intermediate branch. No evidence of disease.   Right Coronary Artery: Large, co-dominant vessel with no evidence of disease.   Left Ventricular Angiogram: Severe global LV systolic dysfunction. LVEF of 20%.    Impression: 1. No angiographic evidence of CAD 2. Severe LV systolic dysfunction, global hypokinesis 3. Elevated filling pressures  Recommendations: Will diurese today. Medical management of non-ischemic cardiomyopathy.    Radiology:  Dg Chest 2 View  01/07/2016  CLINICAL DATA:   Chest pain for 1 week EXAM: CHEST  2 VIEW COMPARISON:  March 18, 2015 chest radiograph and chest CT June 03, 2014 FINDINGS: There is no edema or consolidation. The heart size and pulmonary vascularity are normal. No adenopathy. There is postoperative change in the right breast region. There is a large anterior osteophyte in the mid thoracic region, well seen on prior CT and not appreciably changed. There is borderline acromioclavicular separation on the right. IMPRESSION: No edema or consolidation. Borderline acromioclavicular separation on the right. Electronically Signed   By: Lowella Grip III M.D.   On: 01/07/2016 13:34   Mr Brain Wo Contrast (neuro Protocol)  01/07/2016  CLINICAL DATA:  Intermittent speech stuttering. Headache. Chest pressure. Symptoms for 1 week. EXAM: MRI HEAD WITHOUT CONTRAST TECHNIQUE: Multiplanar, multiecho pulse sequences of the brain and surrounding structures were obtained without intravenous contrast. COMPARISON:  06/03/2014 FINDINGS: There is no evidence of acute infarct, intracranial hemorrhage, mass, midline shift, or extra-axial fluid collection. Ventricles and sulci are normal. T2 hyperintensities in the periventricular greater than subcortical cerebral white matter bilaterally are unchanged and nonspecific but compatible with mild chronic small vessel ischemic disease. Orbits are unremarkable. Bilateral maxillary sinus mucous retention cysts are noted. Mastoid air cells are clear. Major intracranial vascular flow voids are preserved. IMPRESSION: 1. No acute intracranial abnormality. 2. Mild chronic small vessel ischemic disease, unchanged. Electronically Signed   By: Logan Bores M.D.   On: 01/07/2016 13:11    ASSESSMENT AND PLAN:     1. Chest pain - Her chest  pain is concerning for anginal symptoms. Improved with rest. Troponin negative. EKG and telemetry shows sinus rhythm with PVCs. Telemetry shows rate mostly in 80's. - If normal echo, will plan for outpatient  stress test.  - Continue aspirin 81 mg, Lipitor 40 mg, benazepril 20 mg and metoprolol 25 mg.  2. History of nonischemic cardiomyopathy - EF 20-25% and grade II diastolic on echo. F/u cath 05/02/12 showed normal coronaries.  - Last echo 07/22/14 showed LV EF of 55-60%, no wm abnormality and resolved DD.  -  Pending repeat echocardiogram during this admission.  3. HL - 01/07/2016: Cholesterol 229*; HDL 39*; LDL Cholesterol UNABLE TO CALCULATE IF TRIGLYCERIDE OVER 400 mg/dL; Triglycerides 437*; VLDL UNABLE TO CALCULATE IF TRIGLYCERIDE OVER 400 mg/dL  - Likely due to uncontrolled diabetes. - Continue Lipitor 40 mg for now. Recheck lipid panel in 3 months.  5. DM - per primary  6. HTN  - Relatively stable. continue current regimen.   7. Upper extremity tremors, Bilateral  - MRI of brain negative for acute abnormality. Could be diabetic neuropathy. If unresolved consider neurologic evaluation.  8.  Borderline acromioclavicular separation on the right.  - Per primary.    SignedLeanor Kail, [place] 01/08/2016, 9:13 AM Pager [phone]  Co-Sign MD  Patient seen, examined. Available data reviewed. Agree with findings, assessment, and plan as outlined by Robbie Lis, PA-C. The patient is independently interviewed and examined. On exam, she is alert and oriented in no distress, JVP is moderately elevated, lungs with bibasilar rales, heart is regular rate and rhythm without murmur, abdomen is obese, extremities with trace edema.  The patient's history is reviewed. She does have nonischemic cardiomyopathy. She had normal coronary arteries at cardiac catheterization in 2013. She has chest pressure and shortness of breath with activity. Her echocardiogram is reviewed and shows LV dysfunction with an ejection fraction of 35%. The pattern of LV dysfunction is global and I would suspect it is again related to nonischemic cardiomyopathy. It is interesting that her ejection fraction has been up and down  over the past few years with near normalization on her last study. Her LVEF has been as low as 25% and now is back down to 35%. I think ongoing medical therapy is indicated. I suspect her uncontrolled diabetes and hypertension have played a role in worsening heart failure. Other issues with speech problems, tremor, and gait instability as per the primary team. The patient is currently on a combination of an ACE inhibitor, Aldactone, and furosemide. She is also on long-acting metoprolol. Will give her IV furosemide today and follow her labs closely. I don't think ischemic workup is indicated at this time with normal troponins and history of normal coronaries at cath in 2013.   Sherren Mocha, M.D. 01/08/2016 2:49 PM

## 2016-01-08 NOTE — Evaluation (Signed)
Physical Therapy Evaluation Patient Details Name: [name] MRN: [id number] DOB: 1951-02-18 Today's Date: 01/08/2016   History of Present Illness  65 y.o. female with a history of HTN, DM, NICM now improved EF, breast cancer status post lumpectomy and radiation - currently on exemestane, asthma, GERD, depression who sent to [facility] by PCP for evaluation of chest pain.   Clinical Impression  Pt presents with impaired balance, gait and mobility. Discussed need for 24 hour assistance with pt and daughter. Pt's daughter states she and her brother plan to figure out a way to have 24 hour assistance at home. Pt and family agree they do not want SNF placement.  Pt with significant balance deficits and pt and daughter understand PT recommendation for 24 hour assist at home.    Follow Up Recommendations Home health PT;Supervision/Assistance - 24 hour    Equipment Recommendations  None recommended by PT    Recommendations for Other Services OT consult     Precautions / Restrictions Precautions Precautions: Fall Restrictions Weight Bearing Restrictions: No      Mobility  Bed Mobility               General bed mobility comments: pt in chair on PT arrival  Transfers Overall transfer level: Needs assistance   Transfers: Sit to/from Stand Sit to Stand: Min assist         General transfer comment: pt initially stood without AD, pt with anterior lean with standing, requires mod A to keep balance. Pt then performed sit to stand with RW with min A to rise, cues for hand placement  Ambulation/Gait Ambulation/Gait assistance: Mod assist;Min assist Ambulation Distance (Feet): 25 Feet Assistive device: Rolling walker (2 wheeled)       General Gait Details: pt intitially min A wiht RW with slow gait, decreased step length bilat. then pt with episode of LOB with knees flexing requiring mod A to correct. Pt states she does not feel dizzy and that she does not know what  causes her LOB.  Stairs            Wheelchair Mobility    Modified Rankin (Stroke Patients Only)       Balance Overall balance assessment: Needs assistance           Standing balance-Leahy Scale: Poor Standing balance comment: anterior wt shift requiring mod A for balance in static standing without AD                             Pertinent Vitals/Pain Pain Assessment: No/denies pain    Home Living Family/patient expects to be discharged to:: Private residence Living Arrangements: Children Available Help at Discharge: Family;Available PRN/intermittently Type of Home: House Home Access: Stairs to enter   Entrance Stairs-Number of Steps: 3 Home Layout: One level Home Equipment: Walker - 2 wheels;Cane - single point Additional Comments: Pt's daughter reports she did not use AD in the house and uses [facility] when she goes out, states pt has been getting more weak and has been more "shaky" with her mobility    Prior Function Level of Independence: Independent with assistive device(s)               Hand Dominance        Extremity/Trunk Assessment   Upper Extremity Assessment: Generalized weakness           Lower Extremity Assessment: Generalized weakness      Cervical /  Trunk Assessment: Kyphotic  Communication   Communication: No difficulties  Cognition Arousal/Alertness: Awake/alert Behavior During Therapy: Flat affect Overall Cognitive Status: Within Functional Limits for tasks assessed                      General Comments      Exercises        Assessment/Plan    PT Assessment Patient needs continued PT services  PT Diagnosis Difficulty walking;Generalized weakness   PT Problem List Decreased strength;Decreased mobility;Decreased safety awareness;Decreased balance;Decreased activity tolerance;Decreased knowledge of use of DME;Cardiopulmonary status limiting activity  PT Treatment Interventions Gait training;DME  instruction;Therapeutic exercise;Balance training;Stair training;Functional mobility training;Therapeutic activities;Patient/family education;Neuromuscular re-education;Modalities   PT Goals (Current goals can be found in the Care Plan section) Acute Rehab PT Goals Patient Stated Goal: not to go to a SNF PT Goal Formulation: With patient/family Time For Goal Achievement: 01/22/16 Potential to Achieve Goals: Good    Frequency Min 3X/week   Barriers to discharge Inaccessible home environment;Decreased caregiver support 5 steps to enter, daughter works and is unable to provide 24 hour assistance    Co-evaluation               End of Session Equipment Utilized During Treatment: Gait belt Activity Tolerance: Patient limited by fatigue Patient left: in chair;with family/visitor present;with call bell/phone within reach Nurse Communication: Mobility status    Functional Assessment Tool Used: professional judgement Functional Limitation: Mobility: Walking and moving around Mobility: Walking and Moving Around Current Status [phone]): At least 40 percent but less than 60 percent impaired, limited or restricted Mobility: Walking and Moving Around Goal Status [phone]): At least 1 percent but less than 20 percent impaired, limited or restricted    Time: 1010-1030 PT Time Calculation (min) (ACUTE ONLY): 20 min   Charges:   PT Evaluation $PT Eval Moderate Complexity: 1 Procedure     PT G Codes:   PT G-Codes **NOT FOR INPATIENT CLASS** Functional Assessment Tool Used: professional judgement Functional Limitation: Mobility: Walking and moving around Mobility: Walking and Moving Around Current Status [phone]): At least 40 percent but less than 60 percent impaired, limited or restricted Mobility: Walking and Moving Around Goal Status [phone]): At least 1 percent but less than 20 percent impaired, limited or restricted    Posey Jasmin 01/08/2016, 10:45 AM

## 2016-01-08 NOTE — Plan of Care (Signed)
Problem: Consults Goal: Chest Pain Patient Education (See Patient Education module for education specifics.) Outcome: Progressing Assess need for education of cardiac testing Goal: Diabetes Guidelines if Diabetic/Glucose > 140 If diabetic or lab glucose is > 140 mg/dl - Initiate Diabetes/Hyperglycemia Guidelines & Document Interventions  Outcome: Progressing Assess CBG with each and meal and hs  Problem: Phase I Progression Outcomes Goal: Hemodynamically stable Outcome: Progressing Assess VS per unit Routine Goal: Anginal pain relieved Outcome: Progressing Assess pain with VS and prn.

## 2016-01-08 NOTE — Progress Notes (Signed)
PCP Visit  Very much appreciate the care of our inpt team.  She is still having marked but intermittent stuttering. Has difficulty coordinating standing.  Wonder if she could have a neurodegenerative do (Parkinsons?) or perhaps focal seizures vs slow to recover focal neuro deficits from hyperglycemia.  May want to consider a neurology consult if this persists.     Should also have Physical Therapy consult to assess safety and suggest strengthening  For CAD work up.  She recently had elevated LFTs that resolved when we stopped her statin.   If restart statin will need to follow closely  [name] L

## 2016-01-08 NOTE — Progress Notes (Signed)
Inpatient Diabetes Program Recommendations  AACE/ADA: New Consensus Statement on Inpatient Glycemic Control (2015)  Target Ranges:  Prepandial:   less than 140 mg/dL      Peak postprandial:   less than 180 mg/dL (1-2 hours)      Critically ill patients:  140 - 180 mg/dL   Review of Glycemic Control:  Results for [name], [name] (MRN [id number]) as of 01/08/2016 12:02  Ref. Range 01/07/2016 15:44 01/07/2016 21:40 01/08/2016 07:52 01/08/2016 11:31  Glucose-Capillary Latest Ref Range: 65-99 mg/dL 344 (H) 316 (H) 270 (H) 282 (H)  Results for [name], [name] (MRN [id number]) as of 01/08/2016 12:02  Ref. Range 01/07/2016 08:51  Hemoglobin A1C Unknown 10.8    Diabetes history: Type 2 diabetes Outpatient Diabetes medications: None listed Current orders for Inpatient glycemic control:  Diabeta 5 mg daily, Novolog sensitive tid with meals  Inpatient Diabetes Program Recommendations:   Note that CBG's greater than goal.  A1C indicates poor control of diabetes prior to admission. Please consider adding basal insulin such as Levemir 20 units daily (0.2 units/kg).  Also consider adding Novolog 4 units tid with meals (to cover meal intake). May need insulin at discharge based on A1C.   Will follow.  Thanks, Adah Perl, RN, BC-ADM Inpatient Diabetes Coordinator Pager [phone] (8a-5p)

## 2016-01-08 NOTE — Care Management Note (Addendum)
Case Management Note  Patient Details  Name: [name] MRN: [id number] Date of Birth: 1951-04-12  Subjective/Objective:   Pt admitted for chest pain. Pt is from home with daughter and granddaughter. Recomendaitons for [facility]. Pt is agreeable to services. Agency List provided to daughter and she wants to wait on her brother to help make decision.                 Action/Plan: CM will make referral once decision made. No further needs at this time.    Expected Discharge Date:                  Expected Discharge Plan:  [place]  In-House Referral:  NA  Discharge planning Services  CM Consult  Post Acute Care Choice:  Home Health Choice offered to:  Adult Children  DME Arranged:  N/A DME Agency:  NA  HH Arranged:  PT HH Agency:   [facility]  Status of Service:  Completed.  Medicare Important Message Given:    Date Medicare IM Given:    Medicare IM give by:    Date Additional Medicare IM Given:    Additional Medicare Important Message give by:     If discussed at [place] of Stay Meetings, dates discussed:    Additional Comments: 0933 01-09-16 Jacqlyn Krauss, RN,BSN [phone] CM was able to speak with son in reference to [place]. CM did make referral for [facility] via [facility]. SOC to begin within 24-48 hrs post d/c. No further needs from CM at this time.   Bethena Roys, RN 01/08/2016, 1:49 PM

## 2016-01-09 DIAGNOSIS — F8081 Childhood onset fluency disorder: Secondary | ICD-10-CM | POA: Diagnosis not present

## 2016-01-09 DIAGNOSIS — R739 Hyperglycemia, unspecified: Secondary | ICD-10-CM | POA: Diagnosis not present

## 2016-01-09 DIAGNOSIS — I10 Essential (primary) hypertension: Secondary | ICD-10-CM | POA: Diagnosis not present

## 2016-01-09 DIAGNOSIS — I5023 Acute on chronic systolic (congestive) heart failure: Secondary | ICD-10-CM | POA: Diagnosis not present

## 2016-01-09 DIAGNOSIS — R0789 Other chest pain: Secondary | ICD-10-CM | POA: Diagnosis not present

## 2016-01-09 DIAGNOSIS — I5022 Chronic systolic (congestive) heart failure: Secondary | ICD-10-CM

## 2016-01-09 DIAGNOSIS — R079 Chest pain, unspecified: Secondary | ICD-10-CM | POA: Diagnosis not present

## 2016-01-09 LAB — GLUCOSE, CAPILLARY
GLUCOSE-CAPILLARY: 340 mg/dL — AB (ref 65–99)
Glucose-Capillary: 200 mg/dL — ABNORMAL HIGH (ref 65–99)

## 2016-01-09 LAB — BASIC METABOLIC PANEL
ANION GAP: 10 (ref 5–15)
BUN: 26 mg/dL — ABNORMAL HIGH (ref 6–20)
CALCIUM: 9 mg/dL (ref 8.9–10.3)
CO2: 25 mmol/L (ref 22–32)
Chloride: 100 mmol/L — ABNORMAL LOW (ref 101–111)
Creatinine, Ser: 1.41 mg/dL — ABNORMAL HIGH (ref 0.44–1.00)
GFR, EST AFRICAN AMERICAN: 44 mL/min — AB (ref >60)
GFR, EST NON AFRICAN AMERICAN: 38 mL/min — AB (ref >60)
Glucose, Bld: 211 mg/dL — ABNORMAL HIGH (ref 65–99)
POTASSIUM: 4.1 mmol/L (ref 3.5–5.1)
Sodium: 135 mmol/L (ref 135–145)

## 2016-01-09 MED ORDER — GLYBURIDE 5 MG PO TABS
5.0000 mg | ORAL_TABLET | Freq: Every day | ORAL | Status: DC
Start: 2016-01-09 — End: 2016-01-09

## 2016-01-09 MED ORDER — DOXYCYCLINE HYCLATE 100 MG PO TABS: 100.0000 mg | ORAL_TABLET | Freq: Two times a day (BID) | ORAL | Status: AC

## 2016-01-09 MED ORDER — FUROSEMIDE 40 MG PO TABS: ORAL_TABLET | ORAL | Status: DC

## 2016-01-09 MED ORDER — GLIPIZIDE 5 MG PO TABS: ORAL_TABLET | ORAL | Status: DC

## 2016-01-09 MED ORDER — FUROSEMIDE 20 MG PO TABS
20.0000 mg | ORAL_TABLET | Freq: Every day | ORAL | Status: DC
  Administered 2016-01-09: 20 mg via ORAL
  Filled 2016-01-09: qty 1

## 2016-01-09 MED ORDER — METFORMIN HCL 500 MG PO TABS: 500.0000 mg | ORAL_TABLET | Freq: Two times a day (BID) | ORAL | Status: DC

## 2016-01-09 NOTE — Progress Notes (Signed)
    Subjective:  Feels better this morning. No chest pain or shortness of breath.  Objective:  Vital Signs in the last 24 hours: Temp:  [97.3 F (36.3 C)-97.9 F (36.6 C)] 97.9 F (36.6 C) (02/10 0348) Pulse Rate:  [75-81] 81 (02/10 0348) Resp:  [17-18] 18 (02/10 0348) BP: (105)/(75) 105/75 mmHg (02/09 1947) SpO2:  [98 %-100 %] 100 % (02/10 0348) Weight:  [97.07 kg (214 lb)] 97.07 kg (214 lb) (02/10 0348)  Intake/Output from previous day: 02/09 0701 - 02/10 0700 In: [place] [P.O.:1140; I.V.:700] Out: 2050 [Urine:2050]  Physical Exam: Pt is alert and oriented, NAD, sitting up in chair HEENT: normal Neck: JVP - normal Lungs: CTA bilaterally CV: RRR without murmur or gallop Abd: soft, NT, Positive BS, no hepatomegaly Ext: no C/C/E, distal pulses intact and equal Skin: warm/dry no rash   Lab Results:  Recent Labs  01/07/16 1040 01/07/16 1102 01/08/16 0351  WBC 7.1  --  6.9  HGB 11.0* 12.9 10.1*  PLT 247  --  241    Recent Labs  01/08/16 0351 01/09/16 0540  NA 135 135  K 4.4 4.1  CL 102 100*  CO2 22 25  GLUCOSE 291* 211*  BUN 23* 26*  CREATININE 1.15* 1.41*    Recent Labs  01/07/16 2213 01/08/16 0351  TROPONINI <0.03 <0.03    Cardiac Studies: 2-D echocardiogram: Study Conclusions  - Left ventricle: The cavity size was normal. Systolic function was moderately to severely reduced. The estimated ejection fraction was in the range of 30% to 35%. Diffuse hypokinesis. Doppler parameters are consistent with abnormal left ventricular relaxation (grade 1 diastolic dysfunction). - Mitral valve: There was mild regurgitation. - Left atrium: The atrium was mildly dilated. - Right ventricle: The cavity size was mildly dilated. Wall thickness was normal. - Right atrium: The atrium was moderately dilated.  Impressions:  - When compared to prior, EF has reduced.  Tele: Sinus rhythm  Assessment/Plan:  1. Chest pain, atypical: Normal coronary  arteries at cardiac catheterization in 2013. No further evaluation required after negative troponins and resolution of chest pain.  2. Acute on chronic systolic heart failure: While the patient's BNP is normal, she did have progressive shortness of breath and exam evidence of mild volume excess. She has responded well to IV Lasix. She is on a oral regimen of lisinopril, metoprolol succinate, Spirinolactone. Would add low-dose furosemide 20 mg daily. Cardiology follow-up has been arranged. LVEF is 35% by echo. Global hypokinesis is consistent with her known history of nonischemic cardiomyopathy.  3. Essential HTN with CHF: BP well-controlled on current Rx  4. CKD III: outpatient FU. On appropriate Rx.   Dispo: anticipate home today. Has cards FU 2/28 with Dr Oval Linsey already scheduled.   Sherren Mocha, M.D. 01/09/2016, 8:52 AM

## 2016-01-09 NOTE — Discharge Instructions (Signed)
You were admitted for high blood sugar as well as chest pain.   For your high blood sugar, we have added two new medication. Please begin taking metformin 500 mg twice a day, and glybizide 5 mg once a day.   For the boil under your arm, please continue to take the antibiotic (doxycycline). Take one tablet tonight, then one tablet twice a day (last dose 01/14/16).   For your heart, please begin taking furosemide (Lasix) 20 mg once a day.   For your stuttering and the tremor in your hand, we would like you to see a neurologist. They will call you with the date and time of this appointment.

## 2016-01-10 DIAGNOSIS — Z923 Personal history of irradiation: Secondary | ICD-10-CM | POA: Diagnosis not present

## 2016-01-10 DIAGNOSIS — J45909 Unspecified asthma, uncomplicated: Secondary | ICD-10-CM | POA: Diagnosis not present

## 2016-01-10 DIAGNOSIS — Z853 Personal history of malignant neoplasm of breast: Secondary | ICD-10-CM | POA: Diagnosis not present

## 2016-01-10 DIAGNOSIS — Z7984 Long term (current) use of oral hypoglycemic drugs: Secondary | ICD-10-CM | POA: Diagnosis not present

## 2016-01-10 DIAGNOSIS — M1990 Unspecified osteoarthritis, unspecified site: Secondary | ICD-10-CM | POA: Diagnosis not present

## 2016-01-10 DIAGNOSIS — K760 Fatty (change of) liver, not elsewhere classified: Secondary | ICD-10-CM | POA: Diagnosis not present

## 2016-01-10 DIAGNOSIS — I110 Hypertensive heart disease with heart failure: Secondary | ICD-10-CM | POA: Diagnosis not present

## 2016-01-10 DIAGNOSIS — I509 Heart failure, unspecified: Secondary | ICD-10-CM | POA: Diagnosis not present

## 2016-01-10 DIAGNOSIS — E119 Type 2 diabetes mellitus without complications: Secondary | ICD-10-CM | POA: Diagnosis not present

## 2016-01-10 DIAGNOSIS — K219 Gastro-esophageal reflux disease without esophagitis: Secondary | ICD-10-CM | POA: Diagnosis not present

## 2016-01-10 DIAGNOSIS — R269 Unspecified abnormalities of gait and mobility: Secondary | ICD-10-CM | POA: Diagnosis not present

## 2016-01-12 ENCOUNTER — Telehealth: Payer: Self-pay | Admitting: Family Medicine

## 2016-01-12 DIAGNOSIS — Z853 Personal history of malignant neoplasm of breast: Secondary | ICD-10-CM | POA: Diagnosis not present

## 2016-01-12 DIAGNOSIS — J45909 Unspecified asthma, uncomplicated: Secondary | ICD-10-CM | POA: Diagnosis not present

## 2016-01-12 DIAGNOSIS — M1990 Unspecified osteoarthritis, unspecified site: Secondary | ICD-10-CM | POA: Diagnosis not present

## 2016-01-12 DIAGNOSIS — I509 Heart failure, unspecified: Secondary | ICD-10-CM | POA: Diagnosis not present

## 2016-01-12 DIAGNOSIS — R269 Unspecified abnormalities of gait and mobility: Secondary | ICD-10-CM | POA: Diagnosis not present

## 2016-01-12 DIAGNOSIS — Z923 Personal history of irradiation: Secondary | ICD-10-CM | POA: Diagnosis not present

## 2016-01-12 DIAGNOSIS — K219 Gastro-esophageal reflux disease without esophagitis: Secondary | ICD-10-CM | POA: Diagnosis not present

## 2016-01-12 DIAGNOSIS — K760 Fatty (change of) liver, not elsewhere classified: Secondary | ICD-10-CM | POA: Diagnosis not present

## 2016-01-12 DIAGNOSIS — E119 Type 2 diabetes mellitus without complications: Secondary | ICD-10-CM | POA: Diagnosis not present

## 2016-01-12 DIAGNOSIS — I110 Hypertensive heart disease with heart failure: Secondary | ICD-10-CM | POA: Diagnosis not present

## 2016-01-12 DIAGNOSIS — Z7984 Long term (current) use of oral hypoglycemic drugs: Secondary | ICD-10-CM | POA: Diagnosis not present

## 2016-01-12 NOTE — Telephone Encounter (Signed)
LM for [name] with verbal ok from provider. Mirel Hundal,CMA

## 2016-01-12 NOTE — Telephone Encounter (Signed)
Will forward to MD. Jazmin Hartsell,CMA  

## 2016-01-12 NOTE — Telephone Encounter (Signed)
Please make it so  Thanks  [place]

## 2016-01-12 NOTE — Telephone Encounter (Signed)
Dornell from [facility] called and is requesting verbal orders for skilled nursing to teach patient about diabetes and also medications management . Please call her at [phone] with the orders

## 2016-01-13 DIAGNOSIS — M4316 Spondylolisthesis, lumbar region: Secondary | ICD-10-CM | POA: Diagnosis not present

## 2016-01-14 ENCOUNTER — Ambulatory Visit: Payer: Medicare Other | Admitting: Family Medicine

## 2016-01-14 DIAGNOSIS — J45909 Unspecified asthma, uncomplicated: Secondary | ICD-10-CM | POA: Diagnosis not present

## 2016-01-14 DIAGNOSIS — Z7984 Long term (current) use of oral hypoglycemic drugs: Secondary | ICD-10-CM | POA: Diagnosis not present

## 2016-01-14 DIAGNOSIS — K760 Fatty (change of) liver, not elsewhere classified: Secondary | ICD-10-CM | POA: Diagnosis not present

## 2016-01-14 DIAGNOSIS — I110 Hypertensive heart disease with heart failure: Secondary | ICD-10-CM | POA: Diagnosis not present

## 2016-01-14 DIAGNOSIS — E119 Type 2 diabetes mellitus without complications: Secondary | ICD-10-CM | POA: Diagnosis not present

## 2016-01-14 DIAGNOSIS — M1990 Unspecified osteoarthritis, unspecified site: Secondary | ICD-10-CM | POA: Diagnosis not present

## 2016-01-14 DIAGNOSIS — K219 Gastro-esophageal reflux disease without esophagitis: Secondary | ICD-10-CM | POA: Diagnosis not present

## 2016-01-14 DIAGNOSIS — Z923 Personal history of irradiation: Secondary | ICD-10-CM | POA: Diagnosis not present

## 2016-01-14 DIAGNOSIS — R269 Unspecified abnormalities of gait and mobility: Secondary | ICD-10-CM | POA: Diagnosis not present

## 2016-01-14 DIAGNOSIS — Z853 Personal history of malignant neoplasm of breast: Secondary | ICD-10-CM | POA: Diagnosis not present

## 2016-01-14 DIAGNOSIS — I509 Heart failure, unspecified: Secondary | ICD-10-CM | POA: Diagnosis not present

## 2016-01-15 ENCOUNTER — Telehealth: Payer: Self-pay | Admitting: *Deleted

## 2016-01-15 DIAGNOSIS — J45909 Unspecified asthma, uncomplicated: Secondary | ICD-10-CM | POA: Diagnosis not present

## 2016-01-15 DIAGNOSIS — I509 Heart failure, unspecified: Secondary | ICD-10-CM | POA: Diagnosis not present

## 2016-01-15 DIAGNOSIS — Z853 Personal history of malignant neoplasm of breast: Secondary | ICD-10-CM | POA: Diagnosis not present

## 2016-01-15 DIAGNOSIS — K219 Gastro-esophageal reflux disease without esophagitis: Secondary | ICD-10-CM | POA: Diagnosis not present

## 2016-01-15 DIAGNOSIS — Z923 Personal history of irradiation: Secondary | ICD-10-CM | POA: Diagnosis not present

## 2016-01-15 DIAGNOSIS — M1990 Unspecified osteoarthritis, unspecified site: Secondary | ICD-10-CM | POA: Diagnosis not present

## 2016-01-15 DIAGNOSIS — E119 Type 2 diabetes mellitus without complications: Secondary | ICD-10-CM | POA: Diagnosis not present

## 2016-01-15 DIAGNOSIS — Z7984 Long term (current) use of oral hypoglycemic drugs: Secondary | ICD-10-CM | POA: Diagnosis not present

## 2016-01-15 DIAGNOSIS — I110 Hypertensive heart disease with heart failure: Secondary | ICD-10-CM | POA: Diagnosis not present

## 2016-01-15 DIAGNOSIS — R269 Unspecified abnormalities of gait and mobility: Secondary | ICD-10-CM | POA: Diagnosis not present

## 2016-01-15 DIAGNOSIS — K760 Fatty (change of) liver, not elsewhere classified: Secondary | ICD-10-CM | POA: Diagnosis not present

## 2016-01-15 NOTE — Telephone Encounter (Signed)
Beth, RN with [facility] called stating she was out doing her first home visit with patient today.  Questions regarding a couple of her medications that was listed on her discharge forms.  She stated that patient does not have a glucometer or supplies in the home.  Patient had albuterol inhaler listed on discharge forms but does not have an inhaler in the home.  Also there is questions if patient should be on 20 mg or 40 mg of Lasix.  Please give her a call at [phone]. Patient has upcoming appointment on 01/19/16 at 10 AM with PCP.   Derl Barrow, RN

## 2016-01-16 DIAGNOSIS — K219 Gastro-esophageal reflux disease without esophagitis: Secondary | ICD-10-CM | POA: Diagnosis not present

## 2016-01-16 DIAGNOSIS — I110 Hypertensive heart disease with heart failure: Secondary | ICD-10-CM | POA: Diagnosis not present

## 2016-01-16 DIAGNOSIS — R269 Unspecified abnormalities of gait and mobility: Secondary | ICD-10-CM | POA: Diagnosis not present

## 2016-01-16 DIAGNOSIS — Z923 Personal history of irradiation: Secondary | ICD-10-CM | POA: Diagnosis not present

## 2016-01-16 DIAGNOSIS — Z853 Personal history of malignant neoplasm of breast: Secondary | ICD-10-CM | POA: Diagnosis not present

## 2016-01-16 DIAGNOSIS — Z7984 Long term (current) use of oral hypoglycemic drugs: Secondary | ICD-10-CM | POA: Diagnosis not present

## 2016-01-16 DIAGNOSIS — E119 Type 2 diabetes mellitus without complications: Secondary | ICD-10-CM | POA: Diagnosis not present

## 2016-01-16 DIAGNOSIS — K760 Fatty (change of) liver, not elsewhere classified: Secondary | ICD-10-CM | POA: Diagnosis not present

## 2016-01-16 DIAGNOSIS — M1990 Unspecified osteoarthritis, unspecified site: Secondary | ICD-10-CM | POA: Diagnosis not present

## 2016-01-16 DIAGNOSIS — I509 Heart failure, unspecified: Secondary | ICD-10-CM | POA: Diagnosis not present

## 2016-01-16 DIAGNOSIS — J45909 Unspecified asthma, uncomplicated: Secondary | ICD-10-CM | POA: Diagnosis not present

## 2016-01-16 NOTE — Telephone Encounter (Signed)
Beth, RN with [facility] called stating she will have home visits with patient starting next week at least twice a week for a few of weeks.  She would like to see patient  For 4-5 weeks due to the amount of teaching patient requires.  She will be faxing the orders to PCP to review and sign.  Derl Barrow, RN

## 2016-01-19 ENCOUNTER — Encounter: Payer: Self-pay | Admitting: Family Medicine

## 2016-01-19 ENCOUNTER — Ambulatory Visit (INDEPENDENT_AMBULATORY_CARE_PROVIDER_SITE_OTHER): Payer: Medicare Other | Admitting: Family Medicine

## 2016-01-19 VITALS — BP 98/63 | HR 88 | Temp 98.2°F | Wt 215.0 lb

## 2016-01-19 DIAGNOSIS — Z853 Personal history of malignant neoplasm of breast: Secondary | ICD-10-CM | POA: Diagnosis not present

## 2016-01-19 DIAGNOSIS — Z923 Personal history of irradiation: Secondary | ICD-10-CM | POA: Diagnosis not present

## 2016-01-19 DIAGNOSIS — I10 Essential (primary) hypertension: Secondary | ICD-10-CM | POA: Diagnosis not present

## 2016-01-19 DIAGNOSIS — R269 Unspecified abnormalities of gait and mobility: Secondary | ICD-10-CM | POA: Diagnosis not present

## 2016-01-19 DIAGNOSIS — R7989 Other specified abnormal findings of blood chemistry: Secondary | ICD-10-CM | POA: Diagnosis not present

## 2016-01-19 DIAGNOSIS — E1169 Type 2 diabetes mellitus with other specified complication: Secondary | ICD-10-CM

## 2016-01-19 DIAGNOSIS — K760 Fatty (change of) liver, not elsewhere classified: Secondary | ICD-10-CM | POA: Diagnosis not present

## 2016-01-19 DIAGNOSIS — J45909 Unspecified asthma, uncomplicated: Secondary | ICD-10-CM | POA: Diagnosis not present

## 2016-01-19 DIAGNOSIS — M1990 Unspecified osteoarthritis, unspecified site: Secondary | ICD-10-CM | POA: Diagnosis not present

## 2016-01-19 DIAGNOSIS — E119 Type 2 diabetes mellitus without complications: Secondary | ICD-10-CM | POA: Diagnosis not present

## 2016-01-19 DIAGNOSIS — E785 Hyperlipidemia, unspecified: Secondary | ICD-10-CM | POA: Diagnosis not present

## 2016-01-19 DIAGNOSIS — K219 Gastro-esophageal reflux disease without esophagitis: Secondary | ICD-10-CM | POA: Diagnosis not present

## 2016-01-19 DIAGNOSIS — R945 Abnormal results of liver function studies: Secondary | ICD-10-CM

## 2016-01-19 DIAGNOSIS — Z7984 Long term (current) use of oral hypoglycemic drugs: Secondary | ICD-10-CM | POA: Diagnosis not present

## 2016-01-19 DIAGNOSIS — I110 Hypertensive heart disease with heart failure: Secondary | ICD-10-CM | POA: Diagnosis not present

## 2016-01-19 DIAGNOSIS — M21969 Unspecified acquired deformity of unspecified lower leg: Secondary | ICD-10-CM | POA: Diagnosis not present

## 2016-01-19 DIAGNOSIS — I509 Heart failure, unspecified: Secondary | ICD-10-CM | POA: Diagnosis not present

## 2016-01-19 LAB — GLUCOSE, CAPILLARY: GLUCOSE-CAPILLARY: 63 mg/dL — AB (ref 65–99)

## 2016-01-19 MED ORDER — ALBUTEROL SULFATE HFA 108 (90 BASE) MCG/ACT IN AERS: 2.0000 | INHALATION_SPRAY | Freq: Four times a day (QID) | RESPIRATORY_TRACT | Status: DC | PRN

## 2016-01-19 MED ORDER — METFORMIN HCL 1000 MG PO TABS: 1000.0000 mg | ORAL_TABLET | Freq: Two times a day (BID) | ORAL | Status: DC

## 2016-01-19 NOTE — Assessment & Plan Note (Signed)
Stable Check labs.  Has been off statin for several months restarted 2 weeks ago

## 2016-01-19 NOTE — Assessment & Plan Note (Signed)
Recently worsened and started on glipizde and metfromin in hospital.  Given low blood sugar today will stop glipizide and go to full dose metformin and monitor

## 2016-01-19 NOTE — Assessment & Plan Note (Signed)
Back on statin so recheck today

## 2016-01-19 NOTE — Patient Instructions (Signed)
Good to see you today!  Thanks for coming in.  Diabetes  STOP the Glipizide  Take Metformin 1000 mg twice daily - two 500 mg tablets twice daily until you get your refill of 1,000 mg then take one twice daily  You do not need to check your blood sugar if you are only taking metformin  Blood pressure  STOP your Spironolactone  Call or have your nurse call if your blood pressure is usually > 140/90 or < 100/60  Liver Tests  Will check your tests today and I will send you a letter or call  I will send in a rx for Albuterol - use only as needed  Come back in 3-4 weeks

## 2016-01-19 NOTE — Progress Notes (Signed)
   Subjective:    Patient ID: [name], female    DOB: 23-Dec-1950, 66 y.o.   MRN: [id number]  HPI Here for hospital follow up for right hand tremor and intermittent stuttering.  She feels both are better although still some tremor at times mostly intentional.  No visual changes or weakness Has follow up with neurologist schedueled  HYPERTENSION Disease Monitoring: Blood pressure range-not checking Chest pain, palpitations- no      Dyspnea- no Medications: Compliance- brought all meds Lightheadedness,Syncope- yes when stands.  No recent falls gettign Physical Therapy a thome   Edema- no  DIABETES Disease Monitoring: Blood Sugar ranges-not chcking Polyuria/phagia/dipsia- no      Visual problems- no Medications: Compliance- recently restarted on metformin and glipizide after being diet controlled.  No identified cause why worsened Hypoglycemic symptoms- no  HYPERLIPIDEMIA Disease Monitoring: See symptoms for Hypertension Medications: Compliance- restarted statin after being off for elevated LFTs Right upper quadrant pain- no  Muscle aches- no  Monitoring Labs and Parameters Last A1C:  Lab Results  Component Value Date   HGBA1C 10.8 01/07/2016    Last Lipid:     Component Value Date/Time   CHOL 229* 01/07/2016 1635   HDL 39* 01/07/2016 1635   LDLDIRECT 103 08/13/2015 1035    Last Bmet  POTASSIUM  Date Value Ref Range Status  01/09/2016 4.1 3.5 - 5.1 mmol/L Final  09/26/2015 4.7 3.5 - 5.1 mEq/L Final   SODIUM  Date Value Ref Range Status  01/09/2016 135 135 - 145 mmol/L Final  09/26/2015 142 136 - 145 mEq/L Final   CREATININE  Date Value Ref Range Status  09/26/2015 1.5* 0.6 - 1.1 mg/dL Final   CREAT  Date Value Ref Range Status  01/22/2015 1.38* 0.50 - 1.10 mg/dL Final   CREATININE, SER  Date Value Ref Range Status  01/09/2016 1.41* 0.44 - 1.00 mg/dL Final      Last BPs:  BP Readings from Last 3 Encounters:  01/19/16 98/63  01/08/16 105/75    01/07/16 139/63    Chief Complaint noted Review of Symptoms - see HPI PMH - Smoking status noted.   Vital Signs reviewed     Review of Systems     Objective:   Physical Exam Alert nad Lungs:  Normal respiratory effort, chest expands symmetrically. Lungs are clear to auscultation, no crackles or wheezes. Heart - brady RR with occsl every 10th beat pause Extremities:  No cyanosis, edema,        Assessment & Plan:

## 2016-01-19 NOTE — Assessment & Plan Note (Signed)
BP Readings from Last 3 Encounters:  01/19/16 98/63  01/08/16 105/75  01/07/16 139/63   Low today and has hypotensive symptoms.  Stop spironolactone since last EF was normal.  Monitor blood pressure at home

## 2016-01-20 ENCOUNTER — Encounter: Payer: Self-pay | Admitting: Family Medicine

## 2016-01-20 ENCOUNTER — Telehealth: Payer: Self-pay | Admitting: Hematology

## 2016-01-20 DIAGNOSIS — Z853 Personal history of malignant neoplasm of breast: Secondary | ICD-10-CM | POA: Diagnosis not present

## 2016-01-20 DIAGNOSIS — I110 Hypertensive heart disease with heart failure: Secondary | ICD-10-CM | POA: Diagnosis not present

## 2016-01-20 DIAGNOSIS — K760 Fatty (change of) liver, not elsewhere classified: Secondary | ICD-10-CM | POA: Diagnosis not present

## 2016-01-20 DIAGNOSIS — R269 Unspecified abnormalities of gait and mobility: Secondary | ICD-10-CM | POA: Diagnosis not present

## 2016-01-20 DIAGNOSIS — K219 Gastro-esophageal reflux disease without esophagitis: Secondary | ICD-10-CM | POA: Diagnosis not present

## 2016-01-20 DIAGNOSIS — Z923 Personal history of irradiation: Secondary | ICD-10-CM | POA: Diagnosis not present

## 2016-01-20 DIAGNOSIS — E119 Type 2 diabetes mellitus without complications: Secondary | ICD-10-CM | POA: Diagnosis not present

## 2016-01-20 DIAGNOSIS — M1990 Unspecified osteoarthritis, unspecified site: Secondary | ICD-10-CM | POA: Diagnosis not present

## 2016-01-20 DIAGNOSIS — J45909 Unspecified asthma, uncomplicated: Secondary | ICD-10-CM | POA: Diagnosis not present

## 2016-01-20 DIAGNOSIS — Z7984 Long term (current) use of oral hypoglycemic drugs: Secondary | ICD-10-CM | POA: Diagnosis not present

## 2016-01-20 DIAGNOSIS — I509 Heart failure, unspecified: Secondary | ICD-10-CM | POA: Diagnosis not present

## 2016-01-20 LAB — COMPREHENSIVE METABOLIC PANEL
ALT: 18 U/L (ref 6–29)
AST: 31 U/L (ref 10–35)
Albumin: 3.7 g/dL (ref 3.6–5.1)
Alkaline Phosphatase: 147 U/L — ABNORMAL HIGH (ref 33–130)
BILIRUBIN TOTAL: 0.9 mg/dL (ref 0.2–1.2)
BUN: 52 mg/dL — ABNORMAL HIGH (ref 7–25)
CALCIUM: 9.1 mg/dL (ref 8.6–10.4)
CHLORIDE: 104 mmol/L (ref 98–110)
CO2: 24 mmol/L (ref 20–31)
CREATININE: 1.68 mg/dL — AB (ref 0.50–0.99)
GLUCOSE: 56 mg/dL — AB (ref 65–99)
Potassium: 4.8 mmol/L (ref 3.5–5.3)
SODIUM: 140 mmol/L (ref 135–146)
Total Protein: 6.9 g/dL (ref 6.1–8.1)

## 2016-01-20 NOTE — Telephone Encounter (Signed)
Returned call to patient re rescheduling 2/28 lab/fu. Gave patient new appointment for lab/fu 3/7 @ 1:45 pm - date per patient.

## 2016-01-21 DIAGNOSIS — E119 Type 2 diabetes mellitus without complications: Secondary | ICD-10-CM | POA: Diagnosis not present

## 2016-01-21 DIAGNOSIS — K219 Gastro-esophageal reflux disease without esophagitis: Secondary | ICD-10-CM | POA: Diagnosis not present

## 2016-01-21 DIAGNOSIS — I110 Hypertensive heart disease with heart failure: Secondary | ICD-10-CM | POA: Diagnosis not present

## 2016-01-21 DIAGNOSIS — Z923 Personal history of irradiation: Secondary | ICD-10-CM | POA: Diagnosis not present

## 2016-01-21 DIAGNOSIS — M1990 Unspecified osteoarthritis, unspecified site: Secondary | ICD-10-CM | POA: Diagnosis not present

## 2016-01-21 DIAGNOSIS — Z853 Personal history of malignant neoplasm of breast: Secondary | ICD-10-CM | POA: Diagnosis not present

## 2016-01-21 DIAGNOSIS — I509 Heart failure, unspecified: Secondary | ICD-10-CM | POA: Diagnosis not present

## 2016-01-21 DIAGNOSIS — R269 Unspecified abnormalities of gait and mobility: Secondary | ICD-10-CM | POA: Diagnosis not present

## 2016-01-21 DIAGNOSIS — K760 Fatty (change of) liver, not elsewhere classified: Secondary | ICD-10-CM | POA: Diagnosis not present

## 2016-01-21 DIAGNOSIS — J45909 Unspecified asthma, uncomplicated: Secondary | ICD-10-CM | POA: Diagnosis not present

## 2016-01-21 DIAGNOSIS — Z7984 Long term (current) use of oral hypoglycemic drugs: Secondary | ICD-10-CM | POA: Diagnosis not present

## 2016-01-22 DIAGNOSIS — M4316 Spondylolisthesis, lumbar region: Secondary | ICD-10-CM | POA: Diagnosis not present

## 2016-01-22 DIAGNOSIS — Z7984 Long term (current) use of oral hypoglycemic drugs: Secondary | ICD-10-CM | POA: Diagnosis not present

## 2016-01-22 DIAGNOSIS — K760 Fatty (change of) liver, not elsewhere classified: Secondary | ICD-10-CM | POA: Diagnosis not present

## 2016-01-22 DIAGNOSIS — M1990 Unspecified osteoarthritis, unspecified site: Secondary | ICD-10-CM | POA: Diagnosis not present

## 2016-01-22 DIAGNOSIS — I509 Heart failure, unspecified: Secondary | ICD-10-CM | POA: Diagnosis not present

## 2016-01-22 DIAGNOSIS — Z923 Personal history of irradiation: Secondary | ICD-10-CM | POA: Diagnosis not present

## 2016-01-22 DIAGNOSIS — J45909 Unspecified asthma, uncomplicated: Secondary | ICD-10-CM | POA: Diagnosis not present

## 2016-01-22 DIAGNOSIS — M1611 Unilateral primary osteoarthritis, right hip: Secondary | ICD-10-CM | POA: Diagnosis not present

## 2016-01-22 DIAGNOSIS — Z853 Personal history of malignant neoplasm of breast: Secondary | ICD-10-CM | POA: Diagnosis not present

## 2016-01-22 DIAGNOSIS — R269 Unspecified abnormalities of gait and mobility: Secondary | ICD-10-CM | POA: Diagnosis not present

## 2016-01-22 DIAGNOSIS — E119 Type 2 diabetes mellitus without complications: Secondary | ICD-10-CM | POA: Diagnosis not present

## 2016-01-22 DIAGNOSIS — K219 Gastro-esophageal reflux disease without esophagitis: Secondary | ICD-10-CM | POA: Diagnosis not present

## 2016-01-22 DIAGNOSIS — I110 Hypertensive heart disease with heart failure: Secondary | ICD-10-CM | POA: Diagnosis not present

## 2016-01-23 ENCOUNTER — Other Ambulatory Visit: Payer: Self-pay | Admitting: Hematology

## 2016-01-23 DIAGNOSIS — M5136 Other intervertebral disc degeneration, lumbar region: Secondary | ICD-10-CM | POA: Diagnosis not present

## 2016-01-23 DIAGNOSIS — M545 Low back pain: Secondary | ICD-10-CM | POA: Diagnosis not present

## 2016-01-26 ENCOUNTER — Other Ambulatory Visit: Payer: Self-pay | Admitting: *Deleted

## 2016-01-26 DIAGNOSIS — Z923 Personal history of irradiation: Secondary | ICD-10-CM | POA: Diagnosis not present

## 2016-01-26 DIAGNOSIS — I509 Heart failure, unspecified: Secondary | ICD-10-CM | POA: Diagnosis not present

## 2016-01-26 DIAGNOSIS — R269 Unspecified abnormalities of gait and mobility: Secondary | ICD-10-CM | POA: Diagnosis not present

## 2016-01-26 DIAGNOSIS — I110 Hypertensive heart disease with heart failure: Secondary | ICD-10-CM | POA: Diagnosis not present

## 2016-01-26 DIAGNOSIS — K760 Fatty (change of) liver, not elsewhere classified: Secondary | ICD-10-CM | POA: Diagnosis not present

## 2016-01-26 DIAGNOSIS — Z7984 Long term (current) use of oral hypoglycemic drugs: Secondary | ICD-10-CM | POA: Diagnosis not present

## 2016-01-26 DIAGNOSIS — J45909 Unspecified asthma, uncomplicated: Secondary | ICD-10-CM | POA: Diagnosis not present

## 2016-01-26 DIAGNOSIS — M1990 Unspecified osteoarthritis, unspecified site: Secondary | ICD-10-CM | POA: Diagnosis not present

## 2016-01-26 DIAGNOSIS — K219 Gastro-esophageal reflux disease without esophagitis: Secondary | ICD-10-CM | POA: Diagnosis not present

## 2016-01-26 DIAGNOSIS — E119 Type 2 diabetes mellitus without complications: Secondary | ICD-10-CM | POA: Diagnosis not present

## 2016-01-26 DIAGNOSIS — Z853 Personal history of malignant neoplasm of breast: Secondary | ICD-10-CM | POA: Diagnosis not present

## 2016-01-26 MED ORDER — COLCHICINE 0.6 MG PO TABS: 0.6000 mg | ORAL_TABLET | Freq: Two times a day (BID) | ORAL | Status: DC

## 2016-01-26 MED ORDER — FLUTICASONE PROPIONATE HFA 110 MCG/ACT IN AERO: 2.0000 | INHALATION_SPRAY | Freq: Two times a day (BID) | RESPIRATORY_TRACT | Status: DC

## 2016-01-27 ENCOUNTER — Ambulatory Visit: Payer: Medicare Other | Admitting: Hematology

## 2016-01-27 ENCOUNTER — Ambulatory Visit
Admission: RE | Admit: 2016-01-27 | Discharge: 2016-01-27 | Disposition: A | Discharge status: Home / Self Care | Payer: Medicare Other | Source: Ambulatory Visit | Attending: Hematology | Admitting: Hematology

## 2016-01-27 ENCOUNTER — Other Ambulatory Visit: Payer: Medicare Other

## 2016-01-27 ENCOUNTER — Ambulatory Visit (INDEPENDENT_AMBULATORY_CARE_PROVIDER_SITE_OTHER): Payer: Medicare Other | Admitting: Cardiovascular Disease

## 2016-01-27 ENCOUNTER — Encounter: Payer: Self-pay | Admitting: Cardiovascular Disease

## 2016-01-27 VITALS — BP 124/82 | HR 80 | Ht 62.0 in | Wt 220.3 lb

## 2016-01-27 DIAGNOSIS — E785 Hyperlipidemia, unspecified: Secondary | ICD-10-CM

## 2016-01-27 DIAGNOSIS — R928 Other abnormal and inconclusive findings on diagnostic imaging of breast: Secondary | ICD-10-CM | POA: Diagnosis not present

## 2016-01-27 DIAGNOSIS — R079 Chest pain, unspecified: Secondary | ICD-10-CM

## 2016-01-27 DIAGNOSIS — I110 Hypertensive heart disease with heart failure: Secondary | ICD-10-CM | POA: Diagnosis not present

## 2016-01-27 DIAGNOSIS — C50511 Malignant neoplasm of lower-outer quadrant of right female breast: Secondary | ICD-10-CM

## 2016-01-27 DIAGNOSIS — I5041 Acute combined systolic (congestive) and diastolic (congestive) heart failure: Secondary | ICD-10-CM | POA: Diagnosis not present

## 2016-01-27 NOTE — Progress Notes (Signed)
Cardiology Office Note   Date:  [id number]   ID:  [name], DOB [id number], MRN [id number]  PCP:  Lind Covert, MD  Cardiologist:   Sharol Harness, MD   Chief Complaint  Patient presents with  . New Evaluation    post ED, ECHO, EKG 01/09/16//pt c/o SOB on exertion, swelling in bilateral feet/ankles, nauseated and throwing up--pt thinks from medications      History of Present Illness: [name] is a 65 y.o. female with hypertension, chronic systolic and diastolic heart failure, hypertension, diabetes, hyperlipidemia, breast cancer s/p lumpectomy and radiation, and asthma who presents for management of heart failure.  [name] presented to the ED with chest pain that occurred intermittently for 2 days.  When it lasted for hours at a time she decided to seek care.   The chest pain started when walking in her home.  She also noted headache and dizziss.  There was associated shortness of breath.  She has also noted exertional dyspnea and mild LE edema.  She endorses 2 pillow orthopnea but denies PND.  The pain was substernal and did not radiate.  There was no associated nausea, vomiting or diaphoresis.  [name] was admitted to the hospital and had negative cardiac enzymes.  Telemetry revealed sinus rhythm with frequent PVCs.  BNP was not elevate.  Her echo was repeated and revealed LVEF 30-35% with diffuse hypokinesis and grade 1 diastolic dysfunction.  She received IV lasix and was started on po lasix at discharge.  She was already on a beta blocker and ACE-I on admission.  Since discharge from the hospital she denies any recurrent chest pain.  In 2013 [name] had systolic heart failure LVEF 20-25% and grade 2 diastolic dysfunction.  She had a a heart cath that revealed normal LVEF.  Her repeat echo 06/2014 revealed LVEF 55-60%, no wall motion abnormalities or diastolic dysfunction.       Past Medical History  Diagnosis Date  . Hypertension   . Diabetes mellitus    . Depression   . Asthma   . Eczema   . Arthritis   . Cataract   . GERD (gastroesophageal reflux disease)   . History of kidney stones     w/ hx of hydronephrosis - followed by [facility]  . Anemia   . Obesity   . HIV nonspecific serology     2006: indeterminate HIV blood test, seen by ID, felt secondary to cross reacting antibodies with no further workup felt necessary at that time   . Fatty liver     Fatty infiltration of liver noted on 03/2012 CT scan  . CHF, acute ([place]) 05/03/2012  . Breast cancer of lower-outer quadrant of right female breast ([place]) 02/13/2015  . Radiation 05/07/15-06/23/15    Right Breast  . Fibromyalgia   . Heart disease     Past Surgical History  Procedure Laterality Date  . Cystoscopy w/ litholapaxy / ehl    . Cholecystectomy  2003  . Replacement total knee bilateral  2005 &2006  . Joint replacement      bilateral knee replacement  . Vascular surgery Right 03/15/2013    Ultrasound guided sclerotherapy  . Left and right heart catheterization with coronary angiogram N/A 05/02/2012    Procedure: LEFT AND RIGHT HEART CATHETERIZATION WITH CORONARY ANGIOGRAM;  Surgeon: Burnell Blanks, MD;  Location: [facility] CATH LAB;  Service: Cardiovascular;  Laterality: N/A;  . Radioactive seed guided mastectomy with axillary sentinel lymph node  biopsy Right 03/21/2015    Procedure: RIGHT  PARTIAL MASTECTOMY WITH RADIOACTIVE SEED LOCALIZATION RIGHT  AXILLARY SENTINEL LYMPH NODE BIOPSY;  Surgeon: Fanny Skates, MD;  Location: [facility];  Service: General;  Laterality: Right;     Current Outpatient Prescriptions  Medication Sig Dispense Refill  . acetaminophen (TYLENOL 8 HOUR ARTHRITIS PAIN) 650 MG CR tablet Take 650 mg by mouth every 8 (eight) hours as needed for pain.    Marland Kitchen albuterol (PROVENTIL HFA;VENTOLIN HFA) 108 (90 Base) MCG/ACT inhaler Inhale 2 puffs into the lungs every 6 (six) hours as needed for wheezing. 1 Inhaler 3  . aspirin 81 MG tablet Take  81 mg by mouth daily.    Marland Kitchen atorvastatin (LIPITOR) 40 MG tablet Take 1 tablet (40 mg total) by mouth daily. 90 tablet 3  . benazepril (LOTENSIN) 20 MG tablet take 1 tablet by mouth once daily 90 tablet 3  . colchicine 0.6 MG tablet Take 1 tablet (0.6 mg total) by mouth 2 (two) times daily. 60 tablet 0  . exemestane (AROMASIN) 25 MG tablet TAKE 1 TABLET BY MOUTH DAILY AFTER BREAKFAST 30 tablet 2  . fluticasone (FLOVENT HFA) 110 MCG/ACT inhaler Inhale 2 puffs into the lungs 2 (two) times daily. 12 g 6  . furosemide (LASIX) 40 MG tablet Take 1 tablet (20 mg) daily. 90 tablet 0  . gabapentin (NEURONTIN) 100 MG capsule 4-5 tabs three times daily (Patient taking differently: Take 400-500 mg by mouth 3 (three) times daily. 4-5 tabs three times daily) 450 capsule 6  . HYDROcodone-acetaminophen (NORCO) 5-325 MG tablet Take 1-2 tablets by mouth every 6 (six) hours as needed for moderate pain or severe pain. 45 tablet 0  . metFORMIN (GLUCOPHAGE) 1000 MG tablet Take 1 tablet (1,000 mg total) by mouth 2 (two) times daily with a meal. 180 tablet 1  . metoprolol succinate (TOPROL-XL) 25 MG 24 hr tablet Take 1 tablet (25 mg total) by mouth daily. (Patient taking differently: Take 12.5 mg by mouth daily. ) 90 tablet 3  . morphine (MSIR) 15 MG tablet Take 1 tablet by mouth 3 (three) times daily as needed.  0  . potassium citrate (UROCIT-K) 10 MEQ (1080 MG) SR tablet TAKE 1 TABLET BY MOUTH 2 TIMES DAILY 60 tablet 6  . ranitidine (ZANTAC) 150 MG tablet TAKE 1 TABLET BY MOUTH AT BEDTIME 30 tablet 11   No current facility-administered medications for this visit.    Allergies:   Review of patient's allergies indicates no known allergies.    Social History:  The patient  reports that she has never smoked. She has never used smokeless tobacco. She reports that she does not drink alcohol or use illicit drugs.   Family History:  The patient's family history includes Anemia in her father; Cancer (age of onset: 61) in her  sister; Cancer (age of onset: 79) in her cousin; Cancer (age of onset: 40) in her cousin; Diabetes in her mother; Heart disease in her father; Stroke in her mother.    ROS:  Please see the history of present illness.   Otherwise, review of systems are positive for R hip pain.   All other systems are reviewed and negative.    PHYSICAL EXAM: VS:  BP 124/82 mmHg  Pulse 80  Ht 5\' 2"  (1.575 m)  Wt 99.927 kg (220 lb 4.8 oz)  BMI 40.28 kg/m2 , BMI Body mass index is 40.28 kg/(m^2). GENERAL:  Well appearing HEENT:  Pupils equal round and reactive, fundi  not visualized, oral mucosa unremarkable NECK:  No jugular venous distention, waveform within normal limits, carotid upstroke brisk and symmetric, no bruits, no thyromegaly LYMPHATICS:  No cervical adenopathy LUNGS:  Clear to auscultation bilaterally HEART:  RRR.  PMI not displaced or sustained,S1 and S2 within normal limits, no S3, no S4, no clicks, no rubs, no murmurs ABD:  Flat, positive bowel sounds normal in frequency in pitch, no bruits, no rebound, no guarding, no midline pulsatile mass, no hepatomegaly, no splenomegaly EXT:  2 plus pulses throughout, no edema, no cyanosis no clubbing SKIN:  No rashes no nodules NEURO:  Cranial nerves II through XII grossly intact, motor grossly intact throughout PSYCH:  Cognitively intact, oriented to person place and time  EKG:  EKG is not ordered today. The ekg ordered today demonstrates sinus rhythm rate 84 bpm.  Ventricular trigeminy.     Recent Labs: 01/07/2016: B Natriuretic Peptide 66.4; Magnesium 2.0; TSH 1.228 01/08/2016: Hemoglobin 10.1*; Platelets 241 01/19/2016: ALT 18; BUN 52*; Creat 1.68*; Potassium 4.8; Sodium 140    Lipid Panel    Component Value Date/Time   CHOL 229* 01/07/2016 1635   TRIG 437* 01/07/2016 1635   HDL 39* 01/07/2016 1635   CHOLHDL 5.9 01/07/2016 1635   VLDL UNABLE TO CALCULATE IF TRIGLYCERIDE OVER 400 mg/dL 01/07/2016 1635   LDLCALC UNABLE TO CALCULATE IF  TRIGLYCERIDE OVER 400 mg/dL 01/07/2016 1635   LDLDIRECT 103 08/13/2015 1035      Wt Readings from Last 3 Encounters:  01/28/16 99.338 kg (219 lb)  01/27/16 99.927 kg (220 lb 4.8 oz)  01/19/16 97.523 kg (215 lb)      ASSESSMENT AND PLAN:  # Acute on chronic systolic heart failure: [name] heart failure is better with lasix.  She has already been on a beta blocker and ACE-I for >3 months, so technically, if she does not have ischemia requiring an intervention then she qualifies for an ICD given her LVEF 35%.  However, given that her systolic function recovered in the past, we will continue to wait at least 2 months before determining if she needs an ICD.  Continue benazepril, metoprolol, and lasix.  # Chest pain: [name] chest pain is atypical.  However, given the recurrent systolic dysfunction, we will obtain a Lexiscan Cardiolite to evaluate for ischemia.  # Hyperlipidemia: Atorvastatin started this admission.  Repeat lipids and CMP at follow up in 1 month.  # Hypertensive heart disease:  BP well-controlled.  Continue current regimen.  Current medicines are reviewed at length with the patient today.  The patient does not have concerns regarding medicines.  The following changes have been made:  no change  Labs/ tests ordered today include:   Orders Placed This Encounter  Procedures  . Myocardial Perfusion Imaging     Disposition:   FU with Taveon Enyeart C. Oval Linsey, MD, [facility] in 1 month.   This note was written with the assistance of speech recognition software.  Please excuse any transcriptional errors.  Signed, Shavona Gunderman C. Oval Linsey, MD, [facility]  01/29/2016 9:41 PM    [place]

## 2016-01-27 NOTE — Patient Instructions (Signed)
Your physician has requested that you have a lexiscan myoview. For further information please visit [email or web address]. Please follow instruction sheet, as given.  Dr Oval Linsey recommends that you schedule a follow-up appointment in 1 month.

## 2016-01-28 ENCOUNTER — Ambulatory Visit (INDEPENDENT_AMBULATORY_CARE_PROVIDER_SITE_OTHER): Payer: Medicare Other | Admitting: Neurology

## 2016-01-28 ENCOUNTER — Encounter: Payer: Self-pay | Admitting: Neurology

## 2016-01-28 VITALS — BP 110/78 | HR 78 | Resp 24 | Ht 62.0 in | Wt 219.0 lb

## 2016-01-28 DIAGNOSIS — R258 Other abnormal involuntary movements: Secondary | ICD-10-CM

## 2016-01-28 DIAGNOSIS — R251 Tremor, unspecified: Secondary | ICD-10-CM

## 2016-01-28 NOTE — Patient Instructions (Addendum)
We will monitor your tremor. Your exam does not suggests parkinson's disease. I would not recommend any new medication.   Please remember, that any kind of tremor may be exacerbated by anxiety, anger, nervousness, excitement, dehydration, sleep deprivation, by caffeine, and low blood sugar values or blood sugar fluctuations.   Please stay better hydrated, and use your cane for balance.   Some of your medications can cause balance problems, please be mindful. This includes your morphine and your gabapentin.

## 2016-01-28 NOTE — Progress Notes (Signed)
Subjective:    Patient ID: [name] is a 65 y.o. female.  HPI     Kirsten Age, MD, PhD [facility] [address], [address] [place], [place] [address]  I saw [name] as a referral for tremors from the hospital after her recent hospitalization. The patient is unaccompanied today. [name] is a 65 year old right-handed woman with an underlying complex medical history of type 2 diabetes, morbid obesity, chronic systolic CHF, gout, vitamin D deficiency, hyperlipidemia, back pain, arthritis, chronic pain, bilateral knee replacement surgeries, hypertension, depression, kidney stones, degenerative disc disease and asthma, who was recently in the hospital for chest pain. I reviewed the hospital records. I reviewed the discharge summary as well. She was admitted on 01/07/2016 and discharged on 01/09/2016. During the hospital stay she was found to have tremors. Upon admission the patient complained of chest pressure, shortness of breath and was  found to have stuttering. She was found to be hyperglycemic upon admission. Workup for acute coronary was negative, echocardiogram showed grade 1 diastolic dysfunction and EF of 30-35%. Cardiology was consulted. She was diuresed with Lasix. Initial EKG and repeat EKG were fairly unremarkable. She was found to have hyperlipidemia, she was found to have an elevated A1c of 10.8.   She had a brain MRI without contrast secondary to stuttering noted.   She had a brain MRI without contrast on 01/07/2016: IMPRESSION: 1. No acute intracranial abnormality. 2. Mild chronic small vessel ischemic disease, unchanged. In addition, I personally reviewed the images through the PACS system.  She reports tremors in her hands for the past 2 or so years. Symptoms have been mildly progressive. She noticed worsening when she went to the emergency room on 01/07/2016. Sometimes she has difficulty using her right hand in terms of tremor  exacerbation. Generally symptoms are intermittent, sometimes on the left, sometimes on the right. She walks with a cane. She has hip pain, neck pain and back pain. She sees orthopedics. She has been on MSIR 15 mg 3 times a day. She is on a fairly high-dose of gabapentin, 500 mg in the morning and evening and 400 mg in midday.  There is no family history of tremors or Parkinson's   Her Past Medical History Is Significant For: Past Medical History  Diagnosis Date  . Hypertension   . Diabetes mellitus   . Depression   . Asthma   . Eczema   . Arthritis   . Cataract   . GERD (gastroesophageal reflux disease)   . History of kidney stones     w/ hx of hydronephrosis - followed by [facility]  . Anemia   . Obesity   . HIV nonspecific serology     2006: indeterminate HIV blood test, seen by ID, felt secondary to cross reacting antibodies with no further workup felt necessary at that time   . Fatty liver     Fatty infiltration of liver noted on 03/2012 CT scan  . CHF, acute ([place]) 05/03/2012  . Breast cancer of lower-outer quadrant of right female breast ([place]) 02/13/2015  . Radiation 05/07/15-06/23/15    Right Breast  . Fibromyalgia   . Heart disease     Her Past Surgical History Is Significant For: Past Surgical History  Procedure Laterality Date  . Cystoscopy w/ litholapaxy / ehl    . Cholecystectomy  2003  . Replacement total knee bilateral  2005 &2006  . Joint replacement      bilateral knee  replacement  . Vascular surgery Right 03/15/2013    Ultrasound guided sclerotherapy  . Left and right heart catheterization with coronary angiogram N/A 05/02/2012    Procedure: LEFT AND RIGHT HEART CATHETERIZATION WITH CORONARY ANGIOGRAM;  Surgeon: Burnell Blanks, MD;  Location: [facility] CATH LAB;  Service: Cardiovascular;  Laterality: N/A;  . Radioactive seed guided mastectomy with axillary sentinel lymph node biopsy Right 03/21/2015    Procedure: RIGHT  PARTIAL MASTECTOMY WITH RADIOACTIVE SEED  LOCALIZATION RIGHT  AXILLARY SENTINEL LYMPH NODE BIOPSY;  Surgeon: Fanny Skates, MD;  Location: [facility];  Service: General;  Laterality: Right;    Her Family History Is Significant For: Family History  Problem Relation Smith of Onset  . Diabetes Mother   . Stroke Mother   . Heart disease Father   . Anemia Father   . Cancer Sister 17    nose cancer   . Cancer Cousin 30    ovarian cancer   . Cancer Cousin 51    ovarian cancer     Her Social History Is Significant For: Social History   Social History  . Marital Status: Widowed    Spouse Name: [name]  . Number of Children: 3  . Years of Education: 12   Occupational History  . retired-CNA, housekeeping    Social History Main Topics  . Smoking status: Never Smoker   . Smokeless tobacco: Never Used  . Alcohol Use: No  . Drug Use: No  . Sexual Activity: No   Other Topics Concern  . None   Social History Narrative   Health Care POA:    Emergency Contact: son, [name] [phone]   End of Life Plan:    Who lives with you:  [name] and [name]   Any pets: dog   Diet: Pt has a varied diet.  Reports eating 2 meals a day.    Exercise: walks daily 15-20 mins   Seatbelts: Pt reports wearing seatbelt when in vehicles.    Hobbies: word searches, church, time with family and friends, cooking, walking   Denies caffeine use              Her Allergies Are:  No Known Allergies:   Her Current Medications Are:  Outpatient Encounter Prescriptions as of 01/28/2016  Medication Sig  . acetaminophen (TYLENOL 8 HOUR ARTHRITIS PAIN) 650 MG CR tablet Take 650 mg by mouth every 8 (eight) hours as needed for pain.  Marland Kitchen albuterol (PROVENTIL HFA;VENTOLIN HFA) 108 (90 Base) MCG/ACT inhaler Inhale 2 puffs into the lungs every 6 (six) hours as needed for wheezing.  Marland Kitchen aspirin 81 MG tablet Take 81 mg by mouth daily.  Marland Kitchen atorvastatin (LIPITOR) 40 MG tablet Take 1 tablet (40 mg total) by mouth  daily.  . benazepril (LOTENSIN) 20 MG tablet take 1 tablet by mouth once daily  . colchicine 0.6 MG tablet Take 1 tablet (0.6 mg total) by mouth 2 (two) times daily.  Marland Kitchen exemestane (AROMASIN) 25 MG tablet TAKE 1 TABLET BY MOUTH DAILY AFTER BREAKFAST  . fluticasone (FLOVENT HFA) 110 MCG/ACT inhaler Inhale 2 puffs into the lungs 2 (two) times daily.  . furosemide (LASIX) 40 MG tablet Take 1 tablet (20 mg) daily.  Marland Kitchen gabapentin (NEURONTIN) 100 MG capsule 4-5 tabs three times daily (Patient taking differently: Take 400-500 mg by mouth 3 (three) times daily. 4-5 tabs three times daily)  . HYDROcodone-acetaminophen (NORCO) 5-325 MG tablet Take 1-2 tablets by mouth every 6 (six) hours as  needed for moderate pain or severe pain.  . metFORMIN (GLUCOPHAGE) 1000 MG tablet Take 1 tablet (1,000 mg total) by mouth 2 (two) times daily with a meal.  . metoprolol succinate (TOPROL-XL) 25 MG 24 hr tablet Take 1 tablet (25 mg total) by mouth daily. (Patient taking differently: Take 12.5 mg by mouth daily. )  . morphine (MSIR) 15 MG tablet Take 1 tablet by mouth 3 (three) times daily as needed.  . potassium citrate (UROCIT-K) 10 MEQ (1080 MG) SR tablet TAKE 1 TABLET BY MOUTH 2 TIMES DAILY  . ranitidine (ZANTAC) 150 MG tablet TAKE 1 TABLET BY MOUTH AT BEDTIME   No facility-administered encounter medications on file as of 01/28/2016.  :   Review of Systems:  Out of a complete 14 point review of systems, all are reviewed and negative with the exception of these symptoms as listed below:  Review of Systems  Constitutional: Positive for fatigue.  Eyes:       Blurred vision   Cardiovascular: Positive for leg swelling.  Musculoskeletal:       Joint pain and swelling   Neurological: Positive for dizziness, tremors and speech difficulty.       Patient reports that she has had tremors in both hands for years. Patient feels like recently she has started slurring.     Objective:  Neurologic Exam  Physical  Exam Physical Examination:   Filed Vitals:   01/28/16 1051  BP: 110/78  Pulse: 78  Resp: 24   General Examination: The patient is a very pleasant 65 y.o. female in no acute distress. She appears well-developed and well-nourished and adequately groomed. She is not very forthcoming with her history, answers in one or 2 word sentences only. She is able to follow verbal commands.   HEENT: Normocephalic, atraumatic, pupils are equal, round and reactive to light and accommodation. Funduscopic exam is normal with sharp disc margins noted. Extraocular tracking is good without limitation to gaze excursion or nystagmus noted. Normal smooth pursuit is noted. Hearing is grossly intact. Face is symmetric with normal facial animation and normal facial sensation. Speech is clear with no dysarthria noted. There is no hypophonia. There is no lip, neck/head, jaw or voice tremor. Neck is supple with full range of passive and active motion. There are no carotid bruits on auscultation. Oropharynx exam reveals: moderate mouth dryness, adequate dental hygiene and moderate airway crowding.   Chest: Clear to auscultation without wheezing, rhonchi or crackles noted.  Heart: S1+S2+0, regular and normal without murmurs, rubs or gallops noted.   Abdomen: Soft, non-tender and non-distended with normal bowel sounds appreciated on auscultation.  Extremities: There is 1+ pitting edema in the distal lower extremities bilaterally. Pedal pulses are intact.  Skin: Warm and dry without trophic changes noted. There are no varicose veins.  Musculoskeletal: exam reveals no obvious joint deformities, tenderness or joint swelling or erythema.   Neurologically:  Mental status: The patient is awake, alert and oriented in all 4 spheres. Her immediate and remote memory, attention, language skills and fund of knowledge are fairly appropriate. There is no evidence of aphasia, agnosia, apraxia or anomia. Speech is clear with normal prosody  and enunciation, but she answers in one or 2 word sentences only. Mood appears to be constricted and affect is blunted.  Cranial nerves II - XII are as described above under HEENT exam. In addition: shoulder shrug is normal with equal shoulder height noted. Motor exam: Normal bulk, strength and tone is noted. There is no  drift, or rebound. There is no resting tremor. There is an intermittent right hand and independently a intermittent left hand tremor with posture and action, somewhat distractible, most noticeable in the beginning of our appointment and no tremor at the end of our appointment. The tremor frequency and amplitude fluctuated. On Archimedes spiral drawing there is mild tremulousness noted. Handwriting is minimally tremulous and very legible, not particularly micrographic.  Romberg is negative, except minimal sway. Relexes are 1+ in the upper extremities, trace in both knees, absent in both ankles. Fine motor skills are mildly impaired bilaterally but no lateralization is noted. There is no past pointing, heel-to-shin is not possible for her. Sensory exam appears to be intact throughout to light touch, pinprick, vibration and temperature. She seems at times indifferent.  Gait, station and balance: She stands up with mild difficulty. She reports low back pain and hip pain bilaterally. She does not need a cane while standing. She does walk with a cane with preserved arm swing on the right. She holds the cane on the left. She turns slowly. Posture seems Smith-appropriate, slightly more bent in the lower back. Tandem walk is not possible for her.   Assessment and Plan:   In summary, [name] is a 65 y.o.-year old female with an underlying complex medical history of type 2 diabetes, morbid obesity, chronic systolic CHF, gout, vitamin D deficiency, hyperlipidemia, back pain, hypertension, depression, kidney stones, degenerative disc disease and asthma, who has an approximately 2 year history of  intermittent upper extremity tremors. On examination, she has an intermittent right and intermittent left hand tremor, not sustained and mild overall. She has no significant lateralization and no telltale signs of parkinsonism. Her history does not suggest essential tremor. At this juncture, I suggested we monitor her tremor. For her gait instability which is most likely secondary to arthritis, status post joint replacement surgeries, hip pain, back pain, obesity, leg swelling and some medication effect, she is advised to use her cane at all times, change positions slowly, and stay well-hydrated. She does not always seem to drink enough water, she reports that she averages about 2 bottles of water per day. In addition, she is cautioned about balance impairment secondary to medication effect including her gabapentin and morphine. She states she is not on hydrocodone at this time. I talked to her about her test results and I reviewed her MRI brain from her recent admission. I reviewed her hospital records and test results and discharge summary as well. At this juncture, I suggested a six-month follow-up for recheck of her tremors, I did not suggest any new medications as findings are overall mild. She was not noted to have any stuttering today. She was not noted to have any expressive or receptive aphasia but she did not talk very much and answered in only very brief sentences, which appear to be mood and affect related rather than secondary to a neurological condition. I explained my findings to her. She did not have any questions prior to leaving clinic today.   Kirsten Age, MD, PhD

## 2016-01-29 ENCOUNTER — Encounter: Payer: Self-pay | Admitting: Cardiovascular Disease

## 2016-01-29 ENCOUNTER — Encounter: Payer: Self-pay | Admitting: Adult Health

## 2016-01-29 DIAGNOSIS — Z923 Personal history of irradiation: Secondary | ICD-10-CM | POA: Diagnosis not present

## 2016-01-29 DIAGNOSIS — R269 Unspecified abnormalities of gait and mobility: Secondary | ICD-10-CM | POA: Diagnosis not present

## 2016-01-29 DIAGNOSIS — J45909 Unspecified asthma, uncomplicated: Secondary | ICD-10-CM | POA: Diagnosis not present

## 2016-01-29 DIAGNOSIS — K219 Gastro-esophageal reflux disease without esophagitis: Secondary | ICD-10-CM | POA: Diagnosis not present

## 2016-01-29 DIAGNOSIS — Z853 Personal history of malignant neoplasm of breast: Secondary | ICD-10-CM | POA: Diagnosis not present

## 2016-01-29 DIAGNOSIS — Z7984 Long term (current) use of oral hypoglycemic drugs: Secondary | ICD-10-CM | POA: Diagnosis not present

## 2016-01-29 DIAGNOSIS — K760 Fatty (change of) liver, not elsewhere classified: Secondary | ICD-10-CM | POA: Diagnosis not present

## 2016-01-29 DIAGNOSIS — I509 Heart failure, unspecified: Secondary | ICD-10-CM | POA: Diagnosis not present

## 2016-01-29 DIAGNOSIS — E119 Type 2 diabetes mellitus without complications: Secondary | ICD-10-CM | POA: Diagnosis not present

## 2016-01-29 DIAGNOSIS — M1990 Unspecified osteoarthritis, unspecified site: Secondary | ICD-10-CM | POA: Diagnosis not present

## 2016-01-29 DIAGNOSIS — I110 Hypertensive heart disease with heart failure: Secondary | ICD-10-CM | POA: Diagnosis not present

## 2016-01-29 NOTE — Progress Notes (Signed)
A birthday card was mailed to the patient today on behalf of the Survivorship Program at [facility].   Gretchen Dawson, NP Survivorship Program [facility] [phone]  

## 2016-01-30 ENCOUNTER — Encounter: Payer: Self-pay | Admitting: Podiatry

## 2016-01-30 ENCOUNTER — Ambulatory Visit (INDEPENDENT_AMBULATORY_CARE_PROVIDER_SITE_OTHER): Payer: Medicare Other | Admitting: Podiatry

## 2016-01-30 VITALS — BP 127/77 | HR 69 | Resp 14

## 2016-01-30 DIAGNOSIS — R269 Unspecified abnormalities of gait and mobility: Secondary | ICD-10-CM | POA: Diagnosis not present

## 2016-01-30 DIAGNOSIS — L03032 Cellulitis of left toe: Secondary | ICD-10-CM | POA: Diagnosis not present

## 2016-01-30 DIAGNOSIS — Z7984 Long term (current) use of oral hypoglycemic drugs: Secondary | ICD-10-CM | POA: Diagnosis not present

## 2016-01-30 DIAGNOSIS — K760 Fatty (change of) liver, not elsewhere classified: Secondary | ICD-10-CM | POA: Diagnosis not present

## 2016-01-30 DIAGNOSIS — J45909 Unspecified asthma, uncomplicated: Secondary | ICD-10-CM | POA: Diagnosis not present

## 2016-01-30 DIAGNOSIS — M1990 Unspecified osteoarthritis, unspecified site: Secondary | ICD-10-CM | POA: Diagnosis not present

## 2016-01-30 DIAGNOSIS — K219 Gastro-esophageal reflux disease without esophagitis: Secondary | ICD-10-CM | POA: Diagnosis not present

## 2016-01-30 DIAGNOSIS — E119 Type 2 diabetes mellitus without complications: Secondary | ICD-10-CM | POA: Diagnosis not present

## 2016-01-30 DIAGNOSIS — I110 Hypertensive heart disease with heart failure: Secondary | ICD-10-CM | POA: Diagnosis not present

## 2016-01-30 DIAGNOSIS — I509 Heart failure, unspecified: Secondary | ICD-10-CM | POA: Diagnosis not present

## 2016-01-30 DIAGNOSIS — Z853 Personal history of malignant neoplasm of breast: Secondary | ICD-10-CM | POA: Diagnosis not present

## 2016-01-30 DIAGNOSIS — Z923 Personal history of irradiation: Secondary | ICD-10-CM | POA: Diagnosis not present

## 2016-01-30 NOTE — Progress Notes (Signed)
Subjective:     Patient ID: [name], female   DOB: May 02, 1951, 65 y.o.   MRN: [id number]  HPI this patient presents to the office with a blister that she noticed this past Monday. She said she formed a blister on the inside border the big toe left foot. She states there is no drainage or pain associated with the blister patient is a diabetic and she presents to the office for an evaluation of this blister   Review of Systems     Objective:   Physical Exam GENERAL APPEARANCE: Alert, conversant. Appropriately groomed. No acute distress.  VASCULAR: Pedal pulses palpable at  [facility] and PT bilateral.  Capillary refill time is immediate to all digits,  Normal temperature gradient.  Digital hair growth is present bilateral  NEUROLOGIC: sensation is normal to 5.07 monofilament at 5/5 sites bilateral.  Light touch is intact bilateral, Muscle strength normal.  MUSCULOSKELETAL: acceptable muscle strength, tone and stability bilateral.  Intrinsic muscluature intact bilateral.  Rectus appearance of foot and digits noted bilateral.   DERMATOLOGIC: skin color, texture, and turgor are within normal limits.  No preulcerative lesions or ulcers  are seen, no interdigital maceration noted.  No open lesions present.  Digital nails are asymptomatic. No drainage noted. There is dried abscess noted at the distal aspect medial border left hallux.  No redness or swelling or drainage noted.      Assessment:     Healing paronychia medial border  Left hallux.     Plan:     ROV.  Examined her blister which was a healing granulation tissue left hallux. Debrided necrotic tissue.  Told to soak at home.  No antibiotics since the site is healing.   Gardiner Barefoot DPM

## 2016-02-02 ENCOUNTER — Telehealth: Payer: Self-pay | Admitting: *Deleted

## 2016-02-02 DIAGNOSIS — Z923 Personal history of irradiation: Secondary | ICD-10-CM | POA: Diagnosis not present

## 2016-02-02 DIAGNOSIS — E119 Type 2 diabetes mellitus without complications: Secondary | ICD-10-CM | POA: Diagnosis not present

## 2016-02-02 DIAGNOSIS — M1990 Unspecified osteoarthritis, unspecified site: Secondary | ICD-10-CM | POA: Diagnosis not present

## 2016-02-02 DIAGNOSIS — R269 Unspecified abnormalities of gait and mobility: Secondary | ICD-10-CM | POA: Diagnosis not present

## 2016-02-02 DIAGNOSIS — Z7984 Long term (current) use of oral hypoglycemic drugs: Secondary | ICD-10-CM | POA: Diagnosis not present

## 2016-02-02 DIAGNOSIS — I509 Heart failure, unspecified: Secondary | ICD-10-CM | POA: Diagnosis not present

## 2016-02-02 DIAGNOSIS — I110 Hypertensive heart disease with heart failure: Secondary | ICD-10-CM | POA: Diagnosis not present

## 2016-02-02 DIAGNOSIS — K219 Gastro-esophageal reflux disease without esophagitis: Secondary | ICD-10-CM | POA: Diagnosis not present

## 2016-02-02 DIAGNOSIS — K760 Fatty (change of) liver, not elsewhere classified: Secondary | ICD-10-CM | POA: Diagnosis not present

## 2016-02-02 DIAGNOSIS — Z853 Personal history of malignant neoplasm of breast: Secondary | ICD-10-CM | POA: Diagnosis not present

## 2016-02-02 DIAGNOSIS — J45909 Unspecified asthma, uncomplicated: Secondary | ICD-10-CM | POA: Diagnosis not present

## 2016-02-02 NOTE — Telephone Encounter (Signed)
Beth, RN with [facility] called stating she completed home visit today. Patient had complaints of n/v, headache and dizziness.  Patient has been having these intermittent symptoms since discharge.  Patient also had irregular heart rate today per Eustaquio Maize, Therapist, sports.  Please give Eustaquio Maize, RN a call at [phone] to discuss interventions.  Derl Barrow, RN

## 2016-02-03 ENCOUNTER — Encounter: Payer: Self-pay | Admitting: Hematology

## 2016-02-03 ENCOUNTER — Ambulatory Visit (HOSPITAL_BASED_OUTPATIENT_CLINIC_OR_DEPARTMENT_OTHER): Payer: Medicare Other | Admitting: Hematology

## 2016-02-03 ENCOUNTER — Other Ambulatory Visit (HOSPITAL_BASED_OUTPATIENT_CLINIC_OR_DEPARTMENT_OTHER): Payer: Medicare Other

## 2016-02-03 ENCOUNTER — Telehealth: Payer: Self-pay | Admitting: Hematology

## 2016-02-03 VITALS — BP 134/73 | HR 81 | Temp 98.1°F | Resp 18 | Ht 62.0 in | Wt 216.2 lb

## 2016-02-03 DIAGNOSIS — E119 Type 2 diabetes mellitus without complications: Secondary | ICD-10-CM | POA: Diagnosis not present

## 2016-02-03 DIAGNOSIS — M1990 Unspecified osteoarthritis, unspecified site: Secondary | ICD-10-CM

## 2016-02-03 DIAGNOSIS — C50511 Malignant neoplasm of lower-outer quadrant of right female breast: Secondary | ICD-10-CM

## 2016-02-03 DIAGNOSIS — I10 Essential (primary) hypertension: Secondary | ICD-10-CM

## 2016-02-03 DIAGNOSIS — Z170 Estrogen receptor positive status [ER+]: Secondary | ICD-10-CM

## 2016-02-03 DIAGNOSIS — R740 Nonspecific elevation of levels of transaminase and lactic acid dehydrogenase [LDH]: Secondary | ICD-10-CM

## 2016-02-03 LAB — CBC WITH DIFFERENTIAL/PLATELET
BASO%: 0.4 % (ref 0.0–2.0)
BASOS ABS: 0 10*3/uL (ref 0.0–0.1)
EOS%: 1.3 % (ref 0.0–7.0)
Eosinophils Absolute: 0.1 10*3/uL (ref 0.0–0.5)
HCT: 33.4 % — ABNORMAL LOW (ref 34.8–46.6)
HEMOGLOBIN: 10.4 g/dL — AB (ref 11.6–15.9)
LYMPH%: 33 % (ref 14.0–49.7)
MCH: 29.1 pg (ref 25.1–34.0)
MCHC: 31.1 g/dL — AB (ref 31.5–36.0)
MCV: 93.6 fL (ref 79.5–101.0)
MONO#: 0.8 10*3/uL (ref 0.1–0.9)
MONO%: 10 % (ref 0.0–14.0)
NEUT#: 4.2 10*3/uL (ref 1.5–6.5)
NEUT%: 55.3 % (ref 38.4–76.8)
Platelets: 165 10*3/uL (ref 145–400)
RBC: 3.57 10*6/uL — AB (ref 3.70–5.45)
RDW: 14 % (ref 11.2–14.5)
WBC: 7.6 10*3/uL (ref 3.9–10.3)
lymph#: 2.5 10*3/uL (ref 0.9–3.3)

## 2016-02-03 LAB — COMPREHENSIVE METABOLIC PANEL
ALBUMIN: 3.6 g/dL (ref 3.5–5.0)
ALK PHOS: 118 U/L (ref 40–150)
ALT: 33 U/L (ref 0–55)
ANION GAP: 10 meq/L (ref 3–11)
AST: 32 U/L (ref 5–34)
BILIRUBIN TOTAL: 0.65 mg/dL (ref 0.20–1.20)
BUN: 18 mg/dL (ref 7.0–26.0)
CALCIUM: 9.1 mg/dL (ref 8.4–10.4)
CO2: 29 mEq/L (ref 22–29)
Chloride: 102 mEq/L (ref 98–109)
Creatinine: 1.5 mg/dL — ABNORMAL HIGH (ref 0.6–1.1)
EGFR: 41 mL/min/{1.73_m2} — AB (ref >90)
GLUCOSE: 137 mg/dL (ref 70–140)
POTASSIUM: 4.6 meq/L (ref 3.5–5.1)
SODIUM: 140 meq/L (ref 136–145)
TOTAL PROTEIN: 7.1 g/dL (ref 6.4–8.3)

## 2016-02-03 NOTE — Telephone Encounter (Signed)
If pt is feeling badly she should come in for a visit. Otherwise could fu as scheduled Pls call  thanks

## 2016-02-03 NOTE — Progress Notes (Signed)
[place]  Telephone:(336) [phone] [phone]  Clinic follow up Note   Patient Care Team: Lind Covert, MD as PCP - General (Family Medicine) Harriet Masson, DPM (Podiatry) Gevena Cotton, MD (Ophthalmology) Rana Snare, MD (Urology) Dr. Edwin Cap ([place]) Fanny Skates, MD as Consulting Physician (General Surgery) Thea Silversmith, MD as Consulting Physician (Radiation Oncology) Mauro Kaufmann, RN as Registered Nurse Rockwell Germany, RN as Registered Nurse Holley Bouche, NP as Nurse Practitioner (Nurse Practitioner) Truitt Merle, MD as Consulting Physician (Hematology) Sylvan Cheese, NP as Nurse Practitioner (Nurse Practitioner) 02/03/2016  CHIEF COMPLAINTS/PURPOSE OF CONSULTATION:  Follow up breast cancer  Oncology History     Breast cancer of lower-outer quadrant of right female breast   Staging form: Breast, AJCC 7th Edition     Clinical stage from 02/19/2015: Stage IA (T1b, N0, M0) - Unsigned       Staging comments: Staged at breast conference on 3.23.16      Pathologic stage from 03/24/2015: Stage IA (T1c, N0, cM0) - Signed by Enid Cutter, MD on 03/31/2015       Staging comments: Staged on final lumpectomy specimen by Dr. Donato Heinz.      Breast cancer of lower-outer quadrant of right female breast ([place])   01/23/2015 Mammogram Possible mass in right breast warrants further evaluation.  No findings in the left breast suspicious for malignancy   02/03/2015 Breast US Right breast: 5 x 5 x 5 mm irregular hypoechoic mass with internal color vascularity at 6 o'clock position, 4 cm from the nipple   02/10/2015 Initial Biopsy Right needle core bx LOQ: Invasive ductal carcinoma, grade 1, ER+ (100%), PR+ (60%), HER2/neu negative (ratio 1.13), Ki67 3%; DCIS.     02/12/2015 Breast MRI Biopsy-proven malignancy in the central right breast at posterior depth. No MR findings to suggest multicentric or contralateral malignancy.   02/12/2015  Clinical Stage Stage IA: T1b N0   03/21/2015 Definitive Surgery Right breast lumpectomy / SLNB Dalbert Batman): Invasive ductal carcinoma, grade 1, 1.1 cm, ER+ (100%), PR+ (73%), HER-2 negative (ratio 1.3), Ki67 6%, negative margins / no lymphovascular invasion; DCIS.  One lymph node negative for tumor (0/1).     03/21/2015 Oncotype testing RS 7, low risk. (ROR 5% for 10-year recurrence with Tamoxifen alone).    03/21/2015 Pathologic Stage Stage IA: pT1c pN0   05/07/2015 - 06/23/2015 Radiation Therapy Adjuvant RT completed Pablo Ledger): Right breast 45 Gy over 25 fractions.  Right breast boost 16 Gy over 8 fractions. Total dose: 60 Gy.   06/02/2015 -  Anti-estrogen oral therapy Aromasin 25 mg once daily Burr Medico).  Planned duration of therapy: 5 years.   08/15/2015 Survivorship Survivorship visit completed and copy of survivorship care plan provided to patient.    CURRENT THERAPY: aromasin 26m daily, started on 06/02/2015   HISTORY OF PRESENTING ILLNESS:  Kirsten GOSCH65y.o. female is here because of newly diagnosed breast cancer.  This was discovered by screening mammogtram. Her last screening mammogram in February 2015 was normal.  The screening mammogram on 01/23/2015 showed a possible mass in the right breast. She underwent diagnostic mammogram and ultrasound on 02/03/2015, which showed a 5 x 5 x 5 mm irregular mass in the right breast 6:00 position.  Your biopsy of the mass showed invasive ductal carcinoma and DCIS.   She has multiple medical problems. She has chronic arthritis, with diffuse join pain, 8/10, she takes tylenol. No chest pain, (+) dyspena on moderate exertion, no nausea,  abdominal bloating or change of her bowel habits. She is able to do most of her ADLs, limited light housework, not physically active, spends quite a bit of time sitting during the daytime.  She was hospitalized for UTI in 9/15 and went to rehab for a month. She has been using crane more regularly after that.   INTERIM  HISTORY: She returns for follow-up. She was hospitalized 3 weeks ago for chest pain and dyspnea secondary to congestive heart failure. She was also found to have uncontrolled hyperglycemia and hypertension. She was managed medically, improved afterwards. She complains of fatigue, mild intermittent headaches, and a feeding sluggish sometime. She has occasional nausea, no vomiting, appetite is decent. She has been tolerating Aromasin well, mild chronic arthritis has not changed much. No other new complaints.   MEDICAL HISTORY:  Past Medical History  Diagnosis Date  . Hypertension   . Diabetes mellitus   . Depression   . Asthma   . Eczema   . Arthritis   . Cataract   . GERD (gastroesophageal reflux disease)   . History of kidney stones     w/ hx of hydronephrosis - followed by [facility]  . Anemia   . Obesity   . HIV nonspecific serology     2006: indeterminate HIV blood test, seen by ID, felt secondary to cross reacting antibodies with no further workup felt necessary at that time   . Fatty liver     Fatty infiltration of liver noted on 03/2012 CT scan  . CHF, acute ([place]) 05/03/2012  . Breast cancer of lower-outer quadrant of right female breast ([place]) 02/13/2015  . Radiation 05/07/15-06/23/15    Right Breast  . Fibromyalgia   . Heart disease    GYN HISTORY  Menarchal: 11 LMP: Several years ago Contraceptive: 1979-1986 HRT: NO G3P2:  SURGICAL HISTORY: Past Surgical History  Procedure Laterality Date  . Cystoscopy w/ litholapaxy / ehl    . Cholecystectomy  2003  . Replacement total knee bilateral  2005 &2006  . Joint replacement      bilateral knee replacement  . Vascular surgery Right 03/15/2013    Ultrasound guided sclerotherapy  . Left and right heart catheterization with coronary angiogram N/A 05/02/2012    Procedure: LEFT AND RIGHT HEART CATHETERIZATION WITH CORONARY ANGIOGRAM;  Surgeon: Burnell Blanks, MD;  Location[facility] CATH LAB;  Service: Cardiovascular;   Laterality: N/A;  . Radioactive seed guided mastectomy with axillary sentinel lymph node biopsy Right 03/21/2015    Procedure: RIGHT  PARTIAL MASTECTOMY WITH RADIOACTIVE SEED LOCALIZATION RIGHT  AXILLARY SENTINEL LYMPH NODE BIOPSY;  Surgeon: Fanny Skates, MD;  Location: [facility];  Service: General;  Laterality: Right;    SOCIAL HISTORY: History   Social History  . Marital Status: Widowed     Spouse Name: [name]  . Number of Children: 3  . Years of Education: 14   Occupational History  . retired-CNA, housekeeping    Social History Main Topics  . Smoking status: Never Smoker   . Smokeless tobacco: Never Used  . Alcohol Use: No  . Drug Use: No  . Sexual Activity: No   Other Topics Concern  . Not on file   Social History Narrative   Health Care POA:    Emergency Contact: son, Glyn Zendejas [phone]   End of Life Plan:    Who lives with you:  husband Darrall Dears- Dasha is his primary care giver   Any pets: [place]  Diet: Pt has a varied diet.  Is currently working on smaller portions sizes and no late night snacking for weight loss.   Exercise: Pt exercises 1x weekly with church group.   Seatbelts: Pt reports wearing seatbelt when in vehicles.    Hobbies: word searches, church, time with family and friends             FAMILY HISTORY: Family History  Problem Relation Age of Onset  . Diabetes Mother   . Stroke Mother   . Heart disease Father   . Anemia Father   . Cancer Sister 17    nose cancer   . Cancer Cousin 30    ovarian cancer   . Cancer Cousin 2    ovarian cancer     ALLERGIES:  has No Known Allergies.  MEDICATIONS:  Current Outpatient Prescriptions  Medication Sig Dispense Refill  . acetaminophen (TYLENOL 8 HOUR ARTHRITIS PAIN) 650 MG CR tablet Take 650 mg by mouth every 8 (eight) hours as needed for pain.    Marland Kitchen albuterol (PROVENTIL HFA;VENTOLIN HFA) 108 (90 Base) MCG/ACT inhaler Inhale 2 puffs into the lungs every 6  (six) hours as needed for wheezing. 1 Inhaler 3  . aspirin 81 MG tablet Take 81 mg by mouth daily.    Marland Kitchen atorvastatin (LIPITOR) 40 MG tablet Take 1 tablet (40 mg total) by mouth daily. 90 tablet 3  . benazepril (LOTENSIN) 20 MG tablet take 1 tablet by mouth once daily 90 tablet 3  . colchicine 0.6 MG tablet Take 1 tablet (0.6 mg total) by mouth 2 (two) times daily. 60 tablet 0  . exemestane (AROMASIN) 25 MG tablet TAKE 1 TABLET BY MOUTH DAILY AFTER BREAKFAST 30 tablet 2  . fluticasone (FLOVENT HFA) 110 MCG/ACT inhaler Inhale 2 puffs into the lungs 2 (two) times daily. 12 g 6  . furosemide (LASIX) 40 MG tablet Take 1 tablet (20 mg) daily. 90 tablet 0  . gabapentin (NEURONTIN) 100 MG capsule 4-5 tabs three times daily (Patient taking differently: Take 400-500 mg by mouth 3 (three) times daily. 4-5 tabs three times daily) 450 capsule 6  . HYDROcodone-acetaminophen (NORCO) 5-325 MG tablet Take 1-2 tablets by mouth every 6 (six) hours as needed for moderate pain or severe pain. 45 tablet 0  . metFORMIN (GLUCOPHAGE) 1000 MG tablet Take 1 tablet (1,000 mg total) by mouth 2 (two) times daily with a meal. 180 tablet 1  . metoprolol succinate (TOPROL-XL) 25 MG 24 hr tablet Take 1 tablet (25 mg total) by mouth daily. (Patient taking differently: Take 12.5 mg by mouth daily. ) 90 tablet 3  . potassium citrate (UROCIT-K) 10 MEQ (1080 MG) SR tablet TAKE 1 TABLET BY MOUTH 2 TIMES DAILY 60 tablet 6  . ranitidine (ZANTAC) 150 MG tablet TAKE 1 TABLET BY MOUTH AT BEDTIME 30 tablet 11   No current facility-administered medications for this visit.    REVIEW OF SYSTEMS:   Constitutional: Denies fevers, chills or abnormal night sweats Eyes: Denies blurriness of vision, double vision or watery eyes Ears, nose, mouth, throat, and face: Denies mucositis or sore throat Respiratory: Denies cough, dyspnea or wheezes Cardiovascular: Denies palpitation, chest discomfort or lower extremity swelling Gastrointestinal:  Denies  nausea, heartburn or change in bowel habits Skin: Denies abnormal skin rashes Lymphatics: Denies new lymphadenopathy or easy bruising Neurological:Denies numbness, tingling or new weaknesses Behavioral/Psych: Mood is stable, no new changes  All other systems were reviewed with the patient and are negative.  PHYSICAL EXAMINATION: ECOG  PERFORMANCE STATUS: 2 - Symptomatic, <50% confined to bed  Filed Vitals:   02/03/16 1435  BP: 134/73  Pulse: 81  Temp: 98.1 F (36.7 C)  Resp: 18   Filed Weights   02/03/16 1435  Weight: 216 lb 3.2 oz (98.068 kg)    GENERAL:alert, no distress and comfortable SKIN: skin color, texture, turgor are normal, no rashes or significant lesions EYES: normal, conjunctiva are pink and non-injected, sclera clear OROPHARYNX:no exudate, no erythema and lips, buccal mucosa, and tongue normal  NECK: supple, thyroid normal size, non-tender, without nodularity LYMPH:  no palpable lymphadenopathy in the cervical, axillary or inguinal LUNGS: clear to auscultation and percussion with normal breathing effort HEART: regular rate & rhythm and no murmurs and no lower extremity edema ABDOMEN:abdomen soft, non-tender and normal bowel sounds Musculoskeletal:no cyanosis of digits and no clubbing  PSYCH: alert & oriented x 3 with fluent speech NEURO: no focal motor/sensory deficits Breasts: Breast inspection showed them to be symmetrical with no nipple discharge. Lumpectomy surgical scar in right breast is healing well, (+) breast deformity in the inferior right breast secondary to surgery. (+) Diffuse skin pigmentation of the entire right breast and right axilla. Surgical wounds are healed well and there are scar tissue under the incision. Palpation of bilateral breasts feels lumpy, but no discrete mass. Palpitation of axilla revealed no obvious mass that I could appreciate.   LABORATORY DATA:  I have reviewed the data as listed  CBC Latest Ref Rng 02/03/2016 01/08/2016 01/07/2016   WBC 3.9 - 10.3 10e3/uL 7.6 6.9 -  Hemoglobin 11.6 - 15.9 g/dL 10.4(L) 10.1(L) 12.9  Hematocrit 34.8 - 46.6 % 33.4(L) 32.1(L) 38.0  Platelets 145 - 400 10e3/uL 165 241 -   CMP Latest Ref Rng 02/03/2016 01/19/2016 01/09/2016  Glucose 70 - 140 mg/dl 137 56(L) 211(H)  BUN 7.0 - 26.0 mg/dL 18.0 52(H) 26(H)  Creatinine 0.6 - 1.1 mg/dL 1.5(H) 1.68(H) 1.41(H)  Sodium 136 - 145 mEq/L 140 140 135  Potassium 3.5 - 5.1 mEq/L 4.6 4.8 4.1  Chloride 98 - 110 mmol/L - 104 100(L)  CO2 22 - 29 mEq/L _0 Calcium 8.4 - 10.4 mg/dL 9.1 9.1 9.0  Total Protein 6.4 - 8.3 g/dL 7.1 6.9 -  Total Bilirubin 0.20 - 1.20 mg/dL 0.65 0.9 -  Alkaline Phos 40 - 150 U/L 118 147(H) -  AST 5 - 34 U/L 32 31 -  ALT 0 - 55 U/L 33 18 -      Pathology 03/21/2015 Diagnosis 1. Breast, lumpectomy, right - INVASIVE DUCTAL CARCINOMA, SEE COMMENT. - DUCTAL CARCINOMA IN SITU. - NEGATIVE FOR LYMPH VASCULAR INVASION. - PREVIOUS BIOPSY SITE IDENTIFIED. - SEE TUMOR SYNOPTIC TEMPLATE BELOW. 2. Lymph node, sentinel, biopsy, right axillary - ONE LYMPH NODE, NEGATIVE FOR TUMOR (0/1).  1. BREAST, INVASIVE TUMOR, WITH LYMPH NODES PRESENT Specimen, including laterality and lymph node sampling (sentinel, non-sentinel): Right breast with sentinel lymph node sampling. Procedure: Lumpectomy. Histologic type: Grade: I of III Tubule formation: 1 Nuclear pleomorphism: 2 Mitotic:1 Tumor size (gross measurement): 1.1 cm Margins: Invasive, distance to closest margin: 0.9 cm (medial). In-situ, distance to closest margin: Same as invasive. If margin positive, focally or broadly: N/A Lymphovascular invasion: Absent. Ductal carcinoma in situ: Present. Grade: I of III Extensive intraductal component: Absent. Lobular neoplasia: Absent. Tumor focality: Unifocal. Treatment effect: None. If present, treatment effect in breast tissue, lymph nodes or both: N/A Extent of tumor: Skin: N/A Nipple: N/A Skeletal muscle: N/A 2 of 4 FINAL  for JAMILLA, GALLI ([id number]) Microscopic Comment(continued) Lymph nodes: Examined: 1 Sentinel 0 Non-sentinel 1 Total Lymph nodes with metastasis: 0 Isolated tumor cells (< 0.2 mm): N/A Micrometastasis: (> 0.2 mm and < 2.0 mm): N/A Macrometastasis: (> 2.0 mm): N/A Extracapsular extension: N/A Breast prognostic profile: Estrogen receptor: Not repeated, previous study demonstrated 100% positivity ([id number]). Progesterone receptor: Not repeated, previous study demonstrated 60% positivity ([id number]). Her 2 neu: Repeated, previous study demonstrated no amplification (1.70) ([id number]). Ki-67: Not repeated, previous study demonstrated 3% proliferation rate ([id number]). Non-neoplastic breast: Previous biopsy site related tissue changes. TNM: pT1c, pN0, pMX  Results: IMMUNOHISTOCHEMICAL AND MORPHOMETRIC ANALYSIS BY THE AUTOMATED CELLULAR IMAGING SYSTEM (ACIS) Estrogen Receptor: 100%, POSITIVE, STRONG STAINING INTENSITY Progesterone Receptor: 73%, POSITIVE, STRONG STAINING INTENSITY Proliferation Marker Ki67: 6%  1. CHROMOGENIC IN-SITU HYBRIDIZATION Results: HER-2/NEU BY CISH - NEGATIVE. RESULT RATIO OF HER2: CEP 17 SIGNALS 1.30 AVERAGE HER2 COPY NUMBER PER CELL 1.95  Oncotype Dx: RS 7, predicts 5% 10-year risk of distant recurrence with tamoxifen alone, low risk   RADIOGRAPHIC STUDIES: I have personally reviewed the radiological images as listed and agreed with the findings in the report.  01/27/2016  Mammogram  IMPRESSION: No evidence of malignancy within either breast. Expected postsurgical changes in the right breast.  RECOMMENDATION: Bilateral diagnostic mammogram in 1 year. RECOMMENDATION: Diagnostic mammogram and possibly ultrasound of the right breast.  Bone density scan 09/03/2015 ASSESSMENT: The BMD measured at Forearm Radius 33% is 0.925 g/cm2 with a T-score of 0.4. This patient is considered NORMAL according to [facility])  criteria.Lumbar spine was not utilized due to advanced degenerative changes.  ASSESSMENT & PLAN:  65 year old postmenopausal African-American American female, screening detected right breast invasive ductal carcinoma and DCIS.  1. pT1c N0 M0, stage IA right breast invasive ductal carcinoma, ER positive PR positive HER-2 negative, Ki 67 6%, and DCIS -I discussed her imaging findings and surgical pathology results with her and her daughters in great details. -She has early-stage breast cancer, high possibility of cured by the complete surgical resection. -Reviewed the possibility of local and distant recurrence after surgery, although it's low, we recommend adjuvant radiation and endocrine therapy to reduce her risk. -Her Oncotype was low risk, no need adjuvant chemotherapy -She has started Aromasin, tolerating well so far, we'll continue, plan for total 5 years. -Continue breast cancer surveillance with annual mammogram, self and physician breast exam, I encouraged her to have healthy diet and exercise regularly. -Her recent mammogram in February 2017 was normal -Her physical exam and lab are unremarkable, no evidence of recurrence.  2. Bone health -I discussed the side effect of osteopenia and osteoporosis from Aromasin - I encouraged her to continue calcium and vitamin D supplement  -Her bone density scan in 08/2015 was normal.   3. Hypertension, diabetes, arthritis, CHF -She will continue follow-up with her primary care physician -She was recently hospitalized for CHF, the medication has been adjusted.  4. Transaminitis -Resolved and now, probably related to her other meds.   Follow up: -Return to clinic in 4 months with lab  -Continue Aromasin  All questions were answered. The patient knows to call the clinic with any problems, questions or concerns. I spent 20 minutes counseling the patient face to face. The total time spent in the appointment was 25 minutes and more than 50%  was on counseling.     Truitt Merle, MD 02/03/2016 9:58 PM

## 2016-02-03 NOTE — Telephone Encounter (Signed)
LM for [name] with [facility] with message from MD.  I asked her to speak with patient and let us know if patient would like an appt this week with a different provider or if she wants to wait until she sees her PCP. Araiya Tilmon,CMA

## 2016-02-03 NOTE — Telephone Encounter (Signed)
Gave patient avs report and appointments for July.  °

## 2016-02-09 ENCOUNTER — Telehealth: Payer: Self-pay | Admitting: Cardiovascular Disease

## 2016-02-09 DIAGNOSIS — Z923 Personal history of irradiation: Secondary | ICD-10-CM | POA: Diagnosis not present

## 2016-02-09 DIAGNOSIS — J45909 Unspecified asthma, uncomplicated: Secondary | ICD-10-CM | POA: Diagnosis not present

## 2016-02-09 DIAGNOSIS — K760 Fatty (change of) liver, not elsewhere classified: Secondary | ICD-10-CM | POA: Diagnosis not present

## 2016-02-09 DIAGNOSIS — E119 Type 2 diabetes mellitus without complications: Secondary | ICD-10-CM | POA: Diagnosis not present

## 2016-02-09 DIAGNOSIS — K219 Gastro-esophageal reflux disease without esophagitis: Secondary | ICD-10-CM | POA: Diagnosis not present

## 2016-02-09 DIAGNOSIS — I110 Hypertensive heart disease with heart failure: Secondary | ICD-10-CM | POA: Diagnosis not present

## 2016-02-09 DIAGNOSIS — M1990 Unspecified osteoarthritis, unspecified site: Secondary | ICD-10-CM | POA: Diagnosis not present

## 2016-02-09 DIAGNOSIS — Z853 Personal history of malignant neoplasm of breast: Secondary | ICD-10-CM | POA: Diagnosis not present

## 2016-02-09 DIAGNOSIS — R269 Unspecified abnormalities of gait and mobility: Secondary | ICD-10-CM | POA: Diagnosis not present

## 2016-02-09 DIAGNOSIS — Z7984 Long term (current) use of oral hypoglycemic drugs: Secondary | ICD-10-CM | POA: Diagnosis not present

## 2016-02-09 DIAGNOSIS — I509 Heart failure, unspecified: Secondary | ICD-10-CM | POA: Diagnosis not present

## 2016-02-09 NOTE — Telephone Encounter (Signed)
[facility] nurse notes last day of home visit for patient. She wanted to f/u on something. Had apparently spoke w/ PCP office last week regarding pt having irregular pulse felt radially.  She notes the pulse is intermittently regular and irregular.  BPs WNR, pt denies SOB, fatigue, dizziness, palps, etc.  Acknowledged concerns and will review w/ Dr. Oval Linsey.  Pt has upcoming [place] on Friday. F/u sched w/ Dr. Oval Linsey on 3/29 Note most recent EKG NSR w/ freq PVCs.

## 2016-02-09 NOTE — Telephone Encounter (Signed)
This is consistent with the PVCs that have been previously noted.  Continue with the current plan.

## 2016-02-09 NOTE — Telephone Encounter (Signed)
New message     Today is last visit seeing the patient .    C/O heart rate is irregular.

## 2016-02-11 ENCOUNTER — Telehealth (HOSPITAL_COMMUNITY): Payer: Self-pay

## 2016-02-11 NOTE — Telephone Encounter (Signed)
Encounter complete. 

## 2016-02-13 ENCOUNTER — Ambulatory Visit (HOSPITAL_COMMUNITY)
Admission: RE | Admit: 2016-02-13 | Discharge: 2016-02-13 | Disposition: A | Discharge status: Home / Self Care | Payer: Medicare Other | Source: Ambulatory Visit | Attending: Cardiovascular Disease | Admitting: Cardiovascular Disease

## 2016-02-13 DIAGNOSIS — R0609 Other forms of dyspnea: Secondary | ICD-10-CM | POA: Diagnosis not present

## 2016-02-13 DIAGNOSIS — I5041 Acute combined systolic (congestive) and diastolic (congestive) heart failure: Secondary | ICD-10-CM | POA: Diagnosis not present

## 2016-02-13 DIAGNOSIS — R9439 Abnormal result of other cardiovascular function study: Secondary | ICD-10-CM | POA: Diagnosis not present

## 2016-02-13 DIAGNOSIS — Z6839 Body mass index (BMI) 39.0-39.9, adult: Secondary | ICD-10-CM | POA: Diagnosis not present

## 2016-02-13 DIAGNOSIS — I110 Hypertensive heart disease with heart failure: Secondary | ICD-10-CM | POA: Diagnosis not present

## 2016-02-13 DIAGNOSIS — B20 Human immunodeficiency virus [HIV] disease: Secondary | ICD-10-CM | POA: Insufficient documentation

## 2016-02-13 DIAGNOSIS — Z8249 Family history of ischemic heart disease and other diseases of the circulatory system: Secondary | ICD-10-CM | POA: Insufficient documentation

## 2016-02-13 DIAGNOSIS — E119 Type 2 diabetes mellitus without complications: Secondary | ICD-10-CM | POA: Insufficient documentation

## 2016-02-13 DIAGNOSIS — J45909 Unspecified asthma, uncomplicated: Secondary | ICD-10-CM | POA: Diagnosis not present

## 2016-02-13 DIAGNOSIS — E669 Obesity, unspecified: Secondary | ICD-10-CM | POA: Insufficient documentation

## 2016-02-13 DIAGNOSIS — R42 Dizziness and giddiness: Secondary | ICD-10-CM | POA: Insufficient documentation

## 2016-02-13 DIAGNOSIS — R5383 Other fatigue: Secondary | ICD-10-CM | POA: Insufficient documentation

## 2016-02-13 DIAGNOSIS — R079 Chest pain, unspecified: Secondary | ICD-10-CM | POA: Diagnosis not present

## 2016-02-13 LAB — MYOCARDIAL PERFUSION IMAGING
CHL CUP NUCLEAR SSS: 6
CSEPPHR: 88 {beats}/min
LVDIAVOL: 102 mL (ref 46–106)
LVSYSVOL: 49 mL
NUC STRESS TID: 1.07
Rest HR: 69 {beats}/min
SDS: 4
SRS: 2

## 2016-02-13 MED ORDER — TECHNETIUM TC 99M SESTAMIBI GENERIC - CARDIOLITE
31.2000 | Freq: Once | INTRAVENOUS | Status: AC | PRN
Start: 2016-02-13 — End: 2016-02-13
  Administered 2016-02-13: 31.2 via INTRAVENOUS

## 2016-02-13 MED ORDER — AMINOPHYLLINE 25 MG/ML IV SOLN
75.0000 mg | Freq: Once | INTRAVENOUS | Status: AC
  Administered 2016-02-13: 75 mg via INTRAVENOUS

## 2016-02-13 MED ORDER — TECHNETIUM TC 99M SESTAMIBI GENERIC - CARDIOLITE
9.8000 | Freq: Once | INTRAVENOUS | Status: AC | PRN
  Administered 2016-02-13: 9.8 via INTRAVENOUS

## 2016-02-13 MED ORDER — REGADENOSON 0.4 MG/5ML IV SOLN
0.4000 mg | Freq: Once | INTRAVENOUS | Status: AC
  Administered 2016-02-13: 0.4 mg via INTRAVENOUS

## 2016-02-16 ENCOUNTER — Other Ambulatory Visit: Payer: Self-pay | Admitting: Family Medicine

## 2016-02-17 ENCOUNTER — Telehealth: Payer: Self-pay | Admitting: *Deleted

## 2016-02-17 DIAGNOSIS — R9430 Abnormal result of cardiovascular function study, unspecified: Secondary | ICD-10-CM

## 2016-02-17 NOTE — Telephone Encounter (Signed)
-----   Message from Skeet Latch, MD sent at 02/13/2016  3:55 PM EDT ----- Low risk stress test.  Her heart is squeezing better than on her last echo.

## 2016-02-17 NOTE — Telephone Encounter (Signed)
If her chest pain has resolved and she is otherwise feeling well, she can cancel that appointment.  We need an echo in June to reassess her LV function and she can follow up with me after the echo.

## 2016-02-17 NOTE — Telephone Encounter (Signed)
Spoke with patient regarding lexiscan and she is not currently having any problems that he knows of, denies any chest pains or shortness of breath

## 2016-02-17 NOTE — Telephone Encounter (Signed)
Reviewed stress tests results with patient Patient has follow up ov with Dr Oval Linsey next week and wanted to know if she should keep ov Patient denies any further chest pains, shortness of breath, or problems that she knows of Will forward to Dr Oval Linsey for review

## 2016-02-18 ENCOUNTER — Ambulatory Visit (INDEPENDENT_AMBULATORY_CARE_PROVIDER_SITE_OTHER): Payer: Medicare Other | Admitting: Family Medicine

## 2016-02-18 ENCOUNTER — Telehealth: Payer: Self-pay | Admitting: Cardiovascular Disease

## 2016-02-18 ENCOUNTER — Encounter: Payer: Self-pay | Admitting: Family Medicine

## 2016-02-18 VITALS — BP 138/82 | HR 72 | Temp 97.6°F | Ht 62.0 in | Wt 222.0 lb

## 2016-02-18 DIAGNOSIS — E1169 Type 2 diabetes mellitus with other specified complication: Secondary | ICD-10-CM

## 2016-02-18 DIAGNOSIS — J4520 Mild intermittent asthma, uncomplicated: Secondary | ICD-10-CM | POA: Diagnosis not present

## 2016-02-18 DIAGNOSIS — F329 Major depressive disorder, single episode, unspecified: Secondary | ICD-10-CM | POA: Diagnosis not present

## 2016-02-18 DIAGNOSIS — F32A Depression, unspecified: Secondary | ICD-10-CM

## 2016-02-18 DIAGNOSIS — M5441 Lumbago with sciatica, right side: Secondary | ICD-10-CM | POA: Diagnosis not present

## 2016-02-18 DIAGNOSIS — M21969 Unspecified acquired deformity of unspecified lower leg: Secondary | ICD-10-CM

## 2016-02-18 MED ORDER — ALBUTEROL SULFATE HFA 108 (90 BASE) MCG/ACT IN AERS: 2.0000 | INHALATION_SPRAY | Freq: Four times a day (QID) | RESPIRATORY_TRACT | Status: DC | PRN

## 2016-02-18 NOTE — Patient Instructions (Signed)
Good to see you today!  Thanks for coming in.  Stop the colchine - if you have any symptoms of gout call me  Gabapentin take 4 tabs three times a day for one week then if feeling ok go down to 3 tabs three times a day  Come back the middle of May

## 2016-02-18 NOTE — Assessment & Plan Note (Signed)
Improved. Will lower her dose of gabapentin slowly

## 2016-02-18 NOTE — Telephone Encounter (Signed)
Called and left voicemail for the patient to schedule an echo in June 2017 and followup with Dr. Oval Linsey a few days later.

## 2016-02-18 NOTE — Telephone Encounter (Signed)
Advised patient and sent to schedulers to schedule patient

## 2016-02-18 NOTE — Assessment & Plan Note (Signed)
Improved.  On no medications and no signs of persistent depression

## 2016-02-18 NOTE — Assessment & Plan Note (Signed)
Well controlled on as needed albuterol

## 2016-02-18 NOTE — Progress Notes (Signed)
   Subjective:    Patient ID: [name], female    DOB: Oct 21, 1951, 65 y.o.   MRN: [id number]  HPI   Asthma - uses proventil about twice per week.  No shortness of breath with movement or nocturnal cough.  Does feel deconditioned  Back Pain - much improved.  No using narcotics.  Has started back trying to walk for exercise.  No lower extrem weakness or incontinence  Depression - does not feel she is depressed.  Sometimes feels down and will express this but does not feel consistently down nor does she feel she has little please in life  Chief Complaint noted Review of Symptoms - see HPI PMH - Smoking status noted.   Vital Signs reviewed   Review of Systems     Objective:   Physical Exam  Alert interactive joking Walking with cane with minimal pain Psych:  Cognition and judgment appear intact. Alert, communicative  and cooperative with normal attention span and concentration. No apparent delusions, illusions, hallucinations       Assessment & Plan:

## 2016-02-25 ENCOUNTER — Ambulatory Visit: Payer: Medicare Other | Admitting: Cardiovascular Disease

## 2016-03-02 ENCOUNTER — Ambulatory Visit (INDEPENDENT_AMBULATORY_CARE_PROVIDER_SITE_OTHER): Payer: Medicare Other | Admitting: Podiatry

## 2016-03-02 ENCOUNTER — Encounter: Payer: Self-pay | Admitting: Podiatry

## 2016-03-02 DIAGNOSIS — E1142 Type 2 diabetes mellitus with diabetic polyneuropathy: Secondary | ICD-10-CM

## 2016-03-02 DIAGNOSIS — M722 Plantar fascial fibromatosis: Secondary | ICD-10-CM

## 2016-03-02 DIAGNOSIS — M79676 Pain in unspecified toe(s): Secondary | ICD-10-CM | POA: Diagnosis not present

## 2016-03-02 DIAGNOSIS — B351 Tinea unguium: Secondary | ICD-10-CM | POA: Diagnosis not present

## 2016-03-02 MED ORDER — MELOXICAM 15 MG PO TABS
15.0000 mg | ORAL_TABLET | Freq: Every day | ORAL | Status: DC
Start: 2016-03-02 — End: 2016-09-08

## 2016-03-02 NOTE — Progress Notes (Signed)
Patient ID: [name], female   DOB: 23-Jan-1951, 65 y.o.   MRN: [id number] Complaint:  Visit Type: Patient returns to my office for continued preventative foot care services. Complaint: Patient states" my nails have grown long and thick and become painful to walk and wear shoes" Patient has been diagnosed with DM with no complications. He presents for preventative foot care services. No changes to ROS  Podiatric Exam: Vascular: dorsalis pedis and posterior tibial pulses are palpable bilateral. Capillary return is immediate. Temperature gradient is WNL. Skin turgor WNL  Sensorium: Normal Semmes Weinstein monofilament test. Normal tactile sensation bilaterally. Nail Exam: Pt has thick disfigured discolored nails second and third toenails left foot. Ulcer Exam: There is no evidence of ulcer or pre-ulcerative changes or infection. Orthopedic Exam: Muscle tone and strength are WNL. No limitations in general ROM. No crepitus or effusions noted. Foot type and digits show no abnormalities. . Asymptomatic HAV B/L. Palpable pain at insertion plantar fascia B/L. Skin: No Porokeratosis. No infection or ulcers  Diagnosis:  Tinea unguium, , pain in left toes  Treatment & Plan Procedures and Treatment: Consent by patient was obtained for treatment procedures. The patient understood the discussion of treatment and procedures well. All questions were answered thoroughly reviewed. Debridement of mycotic and hypertrophic toenails, 1 through 5 bilateral and clearing of subungual debris. No ulceration, no infection noted.  Prescribed Mobic for heel pain. Return Visit-Office Procedure: Patient instructed to return to the office for a follow up visit 3 months for continued evaluation and treatment.   Gardiner Barefoot DPM

## 2016-03-02 NOTE — Addendum Note (Signed)
Addended by: Ezzard Flax, Shallon Yaklin L on: 03/02/2016 05:01 PM   Modules accepted: Orders

## 2016-03-08 ENCOUNTER — Other Ambulatory Visit (HOSPITAL_COMMUNITY): Payer: Medicare Other

## 2016-03-15 ENCOUNTER — Telehealth: Payer: Self-pay | Admitting: Family Medicine

## 2016-03-15 NOTE — Telephone Encounter (Signed)
Pt is calling because [employer] wanted her to come in earlier to see her PCP, because her weight is fluctuating 215, 216, 217 over the past three days. She has an appointment on 03/31/16. She said that she feels fine and nothing is wrong. She did want to know if this is something that she should come in for now or can she wait until 03/31/16. Please call patient to discuss. jw

## 2016-03-16 NOTE — Telephone Encounter (Signed)
Have her keep checking her weight.  If it gets above 220 she should come in other wise see me On May 3 Thanks  [facility]

## 2016-03-16 NOTE — Telephone Encounter (Signed)
Spoke with patient and she is aware of this. Kinslee Dalpe,CMA  

## 2016-03-17 ENCOUNTER — Other Ambulatory Visit: Payer: Self-pay | Admitting: *Deleted

## 2016-03-18 MED ORDER — METOPROLOL SUCCINATE ER 25 MG PO TB24: 12.5000 mg | ORAL_TABLET | Freq: Every day | ORAL | Status: DC

## 2016-03-30 ENCOUNTER — Encounter: Payer: Self-pay | Admitting: *Deleted

## 2016-03-30 ENCOUNTER — Ambulatory Visit (INDEPENDENT_AMBULATORY_CARE_PROVIDER_SITE_OTHER): Payer: Medicare Other | Admitting: *Deleted

## 2016-03-30 VITALS — BP 144/78 | HR 58 | Temp 97.7°F | Ht 62.0 in | Wt 217.3 lb

## 2016-03-30 DIAGNOSIS — Z0000 Encounter for general adult medical examination without abnormal findings: Secondary | ICD-10-CM

## 2016-03-30 NOTE — Progress Notes (Signed)
Subjective:   [name] is a 65 y.o. female who presents for Medicare Annual (Subsequent) preventive examination.     Objective:     Vitals: BP 144/78 mmHg  Pulse 58  Temp(Src) 97.7 F (36.5 C) (Oral)  Ht 5\' 2"  (1.575 m)  Wt 217 lb 4.8 oz (98.567 kg)  BMI 39.73 kg/m2  SpO2 100%  Body mass index is 39.73 kg/(m^2).   Tobacco History  Smoking status  . Never Smoker   Smokeless tobacco  . Never Used     Counseling given: Yes   Past Medical History  Diagnosis Date  . Hypertension   . Diabetes mellitus   . Depression   . Asthma   . Eczema   . Arthritis   . Cataract   . GERD (gastroesophageal reflux disease)   . History of kidney stones     w/ hx of hydronephrosis - followed by [facility]  . Anemia   . Obesity   . HIV nonspecific serology     2006: indeterminate HIV blood test, seen by ID, felt secondary to cross reacting antibodies with no further workup felt necessary at that time   . Fatty liver     Fatty infiltration of liver noted on 03/2012 CT scan  . CHF, acute ([place]) 05/03/2012  . Breast cancer of lower-outer quadrant of right female breast ([place]) 02/13/2015  . Radiation 05/07/15-06/23/15    Right Breast  . Fibromyalgia   . Heart disease    Past Surgical History  Procedure Laterality Date  . Cystoscopy w/ litholapaxy / ehl    . Cholecystectomy  2003  . Replacement total knee bilateral  2005 &2006  . Joint replacement      bilateral knee replacement  . Vascular surgery Right 03/15/2013    Ultrasound guided sclerotherapy  . Left and right heart catheterization with coronary angiogram N/A 05/02/2012    Procedure: LEFT AND RIGHT HEART CATHETERIZATION WITH CORONARY ANGIOGRAM;  Surgeon: Burnell Blanks, MD;  Location: [facility] CATH LAB;  Service: Cardiovascular;  Laterality: N/A;  . Radioactive seed guided mastectomy with axillary sentinel lymph node biopsy Right 03/21/2015    Procedure: RIGHT  PARTIAL MASTECTOMY WITH RADIOACTIVE SEED LOCALIZATION RIGHT   AXILLARY SENTINEL LYMPH NODE BIOPSY;  Surgeon: Fanny Skates, MD;  Location: [place];  Service: General;  Laterality: Right;   Family History  Problem Relation Age of Onset  . Diabetes Mother   . Stroke Mother   . Heart disease Father   . Anemia Father   . Cancer Sister 17    nose cancer   . Cancer Cousin 30    ovarian cancer   . Cancer Cousin 49    ovarian cancer    History  Sexual Activity  . Sexual Activity: No    Outpatient Encounter Prescriptions as of 03/30/2016  Medication Sig  . acetaminophen (TYLENOL 8 HOUR ARTHRITIS PAIN) 650 MG CR tablet Take 650 mg by mouth every 8 (eight) hours as needed for pain.  Marland Kitchen albuterol (PROVENTIL HFA;VENTOLIN HFA) 108 (90 Base) MCG/ACT inhaler Inhale 2 puffs into the lungs every 6 (six) hours as needed for wheezing.  Marland Kitchen aspirin 81 MG tablet Take 81 mg by mouth daily.  Marland Kitchen atorvastatin (LIPITOR) 40 MG tablet Take 1 tablet (40 mg total) by mouth daily.  . benazepril (LOTENSIN) 20 MG tablet take 1 tablet by mouth once daily  . exemestane (AROMASIN) 25 MG tablet TAKE 1 TABLET BY MOUTH DAILY AFTER BREAKFAST  .  fluticasone (FLOVENT HFA) 110 MCG/ACT inhaler Inhale 2 puffs into the lungs 2 (two) times daily.  . furosemide (LASIX) 40 MG tablet Take 1 tablet (20 mg) daily.  Marland Kitchen gabapentin (NEURONTIN) 100 MG capsule 3-4 tabs three times a day  . metFORMIN (GLUCOPHAGE) 1000 MG tablet Take 1 tablet (1,000 mg total) by mouth 2 (two) times daily with a meal.  . metoprolol succinate (TOPROL-XL) 25 MG 24 hr tablet Take 0.5 tablets (12.5 mg total) by mouth daily.  . potassium citrate (UROCIT-K) 10 MEQ (1080 MG) SR tablet TAKE 1 TABLET BY MOUTH 2 TIMES DAILY  . ranitidine (ZANTAC) 150 MG tablet TAKE 1 TABLET BY MOUTH AT BEDTIME  . meloxicam (MOBIC) 15 MG tablet Take 1 tablet (15 mg total) by mouth daily. (Patient not taking: Reported on 03/30/2016)   No facility-administered encounter medications on file as of 03/30/2016.    Activities of Daily  Living In your present state of health, do you have any difficulty performing the following activities: 03/30/2016 01/07/2016  Hearing? N N  Vision? N N  Difficulty concentrating or making decisions? N N  Walking or climbing stairs? Y Y  Dressing or bathing? N N  Doing errands, shopping? N Y  Conservation officer, nature and eating ? N -  Using the Toilet? N -  In the past six months, have you accidently leaked urine? N -  Do you have problems with loss of bowel control? N -  Managing your Medications? N -  Managing your Finances? N -  Housekeeping or managing your Housekeeping? N -    Patient Care Team: Lind Covert, MD as PCP - General (Family Medicine) Gevena Cotton, MD (Ophthalmology) Rana Snare, MD (Urology) Dr. Edwin Cap ([place]) Fanny Skates, MD as Consulting Physician (General Surgery) Thea Silversmith, MD as Consulting Physician (Radiation Oncology) Mauro Kaufmann, RN as Registered Nurse Rockwell Germany, RN as Registered Nurse Holley Bouche, NP as Nurse Practitioner (Nurse Practitioner) Truitt Merle, MD as Consulting Physician (Hematology) Sylvan Cheese, NP as Nurse Practitioner (Nurse Practitioner) Gardiner Barefoot, DPM (Podiatry)    Assessment:     Exercise Activities and Dietary recommendations Current Exercise Habits: Home exercise routine;Structured exercise class (Wednesday fitness class at church, would like to start walking around the neighborhood more but knee/hip pain limits this), Type of exercise: walking, Intensity: Mild, Exercise limited by: orthopedic condition(s)  Goals    . Blood Pressure < 150/90      Fall Risk Fall Risk  03/30/2016 02/18/2016 01/28/2016 01/19/2016 10/22/2015  Falls in the past year? No No No No No  Number falls in past yr: - - - - -  Injury with Fall? - - - - -  Risk Factor Category  - - - - -  Risk for fall due to : - - - - -  Risk for fall due to (comments): - - - - -  Follow up - - - - -   TUG - Time Get Up  and Go Test 13 seconds without using cane. Slow tentative pace. Used arms to get in and out of chair.  Depression Screen PHQ 2/9 Scores 03/30/2016 02/18/2016 01/19/2016 10/22/2015  PHQ - 2 Score 0 0 0 0  PHQ- 9 Score - - - -  Patient states she has been sad about her husband passing, but she generally does not feel sad or hopeless    Cognitive Testing MMSE - Mini Mental State Exam 05/21/2015 01/18/2014 04/26/2013 04/03/2012  Orientation to time  5 5 5 5   Orientation to Place 5 5 5 5   Registration 3 3 3 3   Attention/ Calculation 5 5 5 5   Recall 3 3 1 3   Language- name 2 objects 2 2 2 2   Language- repeat 1 1 1 1   Language- follow 3 step command 3 3 3 3   Language- read & follow direction 1 1 1 1   Write a sentence 1 1 1 1   Copy design 1 1 1 1   Total score 30 30 28 30    Mini-Cog  Passed with 3/5   Immunization History  Administered Date(s) Administered  . Influenza Split 08/17/2011, 08/23/2012  . Influenza Whole 09/12/2009, 12/14/2010  . Influenza,inj,Quad PF,36+ Mos 08/27/2013, 09/02/2014, 08/13/2015  . Pneumococcal Conjugate-13 05/21/2015  . Pneumococcal Polysaccharide-23 04/30/2012  . Td 07/14/2009  . Zoster 06/02/2012   Screening Tests Health Maintenance  Topic Date Due  . OPHTHALMOLOGY EXAM  04/30/2016  . FOOT EXAM  05/20/2016  . INFLUENZA VACCINE  06/29/2016  . HEMOGLOBIN A1C  07/06/2016  . PNA vac Low Risk Adult (2 of 2 - PPSV23) 04/30/2017  . MAMMOGRAM  01/26/2018  . TETANUS/TDAP  07/15/2019  . COLONOSCOPY  10/03/2021  . DTaP/Tdap/Td  Completed  . DEXA SCAN  Completed  . ZOSTAVAX  Completed  . Hepatitis C Screening  Completed      Plan:    During the course of the visit the patient was educated and counseled about the following appropriate screening and preventive services:   Diabetes Care - has appointment with podiatrist in July, and an upcoming appointment with ophthalmologist  Nutrition  - Cutting back on salt, eating well as discussed in previous visits  with [name]  Exercise - Patient would like to increase walking as able, currently attends Wednesday night fitness classes at her church, and is [facility] [employer] [facility].   Patient Instructions (the written plan) was given to the patient.   Haugh, Adonis Brook  03/30/2016

## 2016-03-30 NOTE — Patient Instructions (Addendum)
[name] , Thank you for taking time to come for your Medicare Wellness Visit. I appreciate your ongoing commitment to your health goals. Please review the following plan we discussed and let me know if I can assist you in the future.   These are the goals we discussed: Goals    . Blood Pressure < 150/90       This is a list of the screening recommended for you and due dates:  Health Maintenance  Topic Date Due  . Eye exam for diabetics  04/30/2016  . Complete foot exam   05/20/2016  . Flu Shot  06/29/2016  . Hemoglobin A1C  07/06/2016  . Pneumonia vaccines (2 of 2 - PPSV23) 04/30/2017  . Mammogram  01/26/2018  . Tetanus Vaccine  07/15/2019  . Colon Cancer Screening  10/03/2021  . DTaP/Tdap/Td vaccine  Completed  . DEXA scan (bone density measurement)  Completed  . Shingles Vaccine  Completed  .  Hepatitis C: One time screening is recommended by Center for Disease Control  (CDC) for  adults born from 1945 through 1965.   Completed    Fall Prevention in the Home  Falls can cause injuries. They can happen to people of all ages. There are many things you can do to make your home safe and to help prevent falls.  WHAT CAN I DO ON THE OUTSIDE OF MY HOME?  Regularly fix the edges of walkways and driveways and fix any cracks.  Remove anything that might make you trip as you walk through a door, such as a raised step or threshold.  Trim any bushes or trees on the path to your home.  Use bright outdoor lighting.  Clear any walking paths of anything that might make someone trip, such as rocks or tools.  Regularly check to see if handrails are loose or broken. Make sure that both sides of any steps have handrails.  Any raised decks and porches should have guardrails on the edges.  Have any leaves, snow, or ice cleared regularly.  Use sand or salt on walking paths during winter.  Clean up any spills in your garage right away. This includes oil or grease spills. WHAT CAN I DO IN  THE BATHROOM?   Use night lights.  Install grab bars by the toilet and in the tub and shower. Do not use towel bars as grab bars.  Use non-skid mats or decals in the tub or shower.  If you need to sit down in the shower, use a plastic, non-slip stool.  Keep the floor dry. Clean up any water that spills on the floor as soon as it happens.  Remove soap buildup in the tub or shower regularly.  Attach bath mats securely with double-sided non-slip rug tape.  Do not have throw rugs and other things on the floor that can make you trip. WHAT CAN I DO IN THE BEDROOM?  Use night lights.  Make sure that you have a light by your bed that is easy to reach.  Do not use any sheets or blankets that are too big for your bed. They should not hang down onto the floor.  Have a firm chair that has side arms. You can use this for support while you get dressed.  Do not have throw rugs and other things on the floor that can make you trip. WHAT CAN I DO IN THE KITCHEN?  Clean up any spills right away.  Avoid walking on wet floors.  Keep   items that you use a lot in easy-to-reach places.  If you need to reach something above you, use a strong step stool that has a grab bar.  Keep electrical cords out of the way.  Do not use floor polish or wax that makes floors slippery. If you must use wax, use non-skid floor wax.  Do not have throw rugs and other things on the floor that can make you trip. WHAT CAN I DO WITH MY STAIRS?  Do not leave any items on the stairs.  Make sure that there are handrails on both sides of the stairs and use them. Fix handrails that are broken or loose. Make sure that handrails are as long as the stairways.  Check any carpeting to make sure that it is firmly attached to the stairs. Fix any carpet that is loose or worn.  Avoid having throw rugs at the top or bottom of the stairs. If you do have throw rugs, attach them to the floor with carpet tape.  Make sure that you  have a light switch at the top of the stairs and the bottom of the stairs. If you do not have them, ask someone to add them for you. WHAT ELSE CAN I DO TO HELP PREVENT FALLS?  Wear shoes that:  Do not have high heels.  Have rubber bottoms.  Are comfortable and fit you well.  Are closed at the toe. Do not wear sandals.  If you use a stepladder:  Make sure that it is fully opened. Do not climb a closed stepladder.  Make sure that both sides of the stepladder are locked into place.  Ask someone to hold it for you, if possible.  Clearly mark and make sure that you can see:  Any grab bars or handrails.  First and last steps.  Where the edge of each step is.  Use tools that help you move around (mobility aids) if they are needed. These include:  Canes.  Walkers.  Scooters.  Crutches.  Turn on the lights when you go into a dark area. Replace any light bulbs as soon as they burn out.  Set up your furniture so you have a clear path. Avoid moving your furniture around.  If any of your floors are uneven, fix them.  If there are any pets around you, be aware of where they are.  Review your medicines with your doctor. Some medicines can make you feel dizzy. This can increase your chance of falling. Ask your doctor what other things that you can do to help prevent falls.   This information is not intended to replace advice given to you by your health care provider. Make sure you discuss any questions you have with your health care provider.   Document Released: 09/11/2009 Document Revised: 04/01/2015 Document Reviewed: 12/20/2014 Elsevier Interactive Patient Education 2016 [place] Maintenance, Female Adopting a healthy lifestyle and getting preventive care can go a long way to promote health and wellness. Talk with your health care provider about what schedule of regular examinations is right for you. This is a good chance for you to check in with your  provider about disease prevention and staying healthy. In between checkups, there are plenty of things you can do on your own. Experts have done a lot of research about which lifestyle changes and preventive measures are most likely to keep you healthy. Ask your health care provider for more information. WEIGHT AND DIET  Eat a healthy diet  Be sure to include plenty of vegetables, fruits, low-fat dairy products, and lean protein.  Do not eat a lot of foods high in solid fats, added sugars, or salt.  Get regular exercise. This is one of the most important things you can do for your health.  Most adults should exercise for at least 150 minutes each week. The exercise should increase your heart rate and make you sweat (moderate-intensity exercise).  Most adults should also do strengthening exercises at least twice a week. This is in addition to the moderate-intensity exercise.  Maintain a healthy weight  Body mass index (BMI) is a measurement that can be used to identify possible weight problems. It estimates body fat based on height and weight. Your health care provider can help determine your BMI and help you achieve or maintain a healthy weight.  For females 9 years of age and older:   A BMI below 18.5 is considered underweight.  A BMI of 18.5 to 24.9 is normal.  A BMI of 25 to 29.9 is considered overweight.  A BMI of 30 and above is considered obese.  Watch levels of cholesterol and blood lipids  You should start having your blood tested for lipids and cholesterol at 67 years of age, then have this test every 5 years.  You may need to have your cholesterol levels checked more often if:  Your lipid or cholesterol levels are high.  You are older than 67 years of age.  You are at high risk for heart disease.  CANCER SCREENING   Lung Cancer  Lung cancer screening is recommended for adults 7-16 years old who are at high risk for lung cancer because of a history of  smoking.  A yearly low-dose CT scan of the lungs is recommended for people who:  Currently smoke.  Have quit within the past 15 years.  Have at least a 30-pack-year history of smoking. A pack year is smoking an average of one pack of cigarettes a day for 1 year.  Yearly screening should continue until it has been 15 years since you quit.  Yearly screening should stop if you develop a health problem that would prevent you from having lung cancer treatment.  Breast Cancer  Practice breast self-awareness. This means understanding how your breasts normally appear and feel.  It also means doing regular breast self-exams. Let your health care provider know about any changes, no matter how small.  If you are in your 20s or 30s, you should have a clinical breast exam (CBE) by a health care provider every 1-3 years as part of a regular health exam.  If you are 57 or older, have a CBE every year. Also consider having a breast X-ray (mammogram) every year.  If you have a family history of breast cancer, talk to your health care provider about genetic screening.  If you are at high risk for breast cancer, talk to your health care provider about having an MRI and a mammogram every year.  Breast cancer gene (BRCA) assessment is recommended for women who have family members with BRCA-related cancers. BRCA-related cancers include:  Breast.  Ovarian.  Tubal.  Peritoneal cancers.  Results of the assessment will determine the need for genetic counseling and BRCA1 and BRCA2 testing. Cervical Cancer Your health care provider may recommend that you be screened regularly for cancer of the pelvic organs (ovaries, uterus, and vagina). This screening involves a pelvic examination, including checking for microscopic changes to the surface of your cervix (  Pap test). You may be encouraged to have this screening done every 3 years, beginning at age 21.  For women ages 30-65, health care providers may  recommend pelvic exams and Pap testing every 3 years, or they may recommend the Pap and pelvic exam, combined with testing for human papilloma virus (HPV), every 5 years. Some types of HPV increase your risk of cervical cancer. Testing for HPV may also be done on women of any age with unclear Pap test results.  Other health care providers may not recommend any screening for nonpregnant women who are considered low risk for pelvic cancer and who do not have symptoms. Ask your health care provider if a screening pelvic exam is right for you.  If you have had past treatment for cervical cancer or a condition that could lead to cancer, you need Pap tests and screening for cancer for at least 20 years after your treatment. If Pap tests have been discontinued, your risk factors (such as having a new sexual partner) need to be reassessed to determine if screening should resume. Some women have medical problems that increase the chance of getting cervical cancer. In these cases, your health care provider may recommend more frequent screening and Pap tests. Colorectal Cancer  This type of cancer can be detected and often prevented.  Routine colorectal cancer screening usually begins at 67 years of age and continues through 67 years of age.  Your health care provider may recommend screening at an earlier age if you have risk factors for colon cancer.  Your health care provider may also recommend using home test kits to check for hidden blood in the stool.  A small camera at the end of a tube can be used to examine your colon directly (sigmoidoscopy or colonoscopy). This is done to check for the earliest forms of colorectal cancer.  Routine screening usually begins at age 50.  Direct examination of the colon should be repeated every 5-10 years through 67 years of age. However, you may need to be screened more often if early forms of precancerous polyps or small growths are found. Skin Cancer  Check your  skin from head to toe regularly.  Tell your health care provider about any new moles or changes in moles, especially if there is a change in a mole's shape or color.  Also tell your health care provider if you have a mole that is larger than the size of a pencil eraser.  Always use sunscreen. Apply sunscreen liberally and repeatedly throughout the day.  Protect yourself by wearing long sleeves, pants, a wide-brimmed hat, and sunglasses whenever you are outside. HEART DISEASE, DIABETES, AND HIGH BLOOD PRESSURE   High blood pressure causes heart disease and increases the risk of stroke. High blood pressure is more likely to develop in:  People who have blood pressure in the high end of the normal range (130-139/85-89 mm Hg).  People who are overweight or obese.  People who are African American.  If you are 18-39 years of age, have your blood pressure checked every 3-5 years. If you are 40 years of age or older, have your blood pressure checked every year. You should have your blood pressure measured twice--once when you are at a hospital or clinic, and once when you are not at a hospital or clinic. Record the average of the two measurements. To check your blood pressure when you are not at a hospital or clinic, you can use:  An automated blood pressure   machine at a pharmacy.  A home blood pressure monitor.  If you are between 55 years and 79 years old, ask your health care provider if you should take aspirin to prevent strokes.  Have regular diabetes screenings. This involves taking a blood sample to check your fasting blood sugar level.  If you are at a normal weight and have a low risk for diabetes, have this test once every three years after 67 years of age.  If you are overweight and have a high risk for diabetes, consider being tested at a younger age or more often. PREVENTING INFECTION  Hepatitis B  If you have a higher risk for hepatitis B, you should be screened for this  virus. You are considered at high risk for hepatitis B if:  You were born in a country where hepatitis B is common. Ask your health care provider which countries are considered high risk.  Your parents were born in a high-risk country, and you have not been immunized against hepatitis B (hepatitis B vaccine).  You have HIV or AIDS.  You use needles to inject street drugs.  You live with someone who has hepatitis B.  You have had sex with someone who has hepatitis B.  You get hemodialysis treatment.  You take certain medicines for conditions, including cancer, organ transplantation, and autoimmune conditions. Hepatitis C  Blood testing is recommended for:  Everyone born from 1945 through 1965.  Anyone with known risk factors for hepatitis C. Sexually transmitted infections (STIs)  You should be screened for sexually transmitted infections (STIs) including gonorrhea and chlamydia if:  You are sexually active and are younger than 67 years of age.  You are older than 67 years of age and your health care provider tells you that you are at risk for this type of infection.  Your sexual activity has changed since you were last screened and you are at an increased risk for chlamydia or gonorrhea. Ask your health care provider if you are at risk.  If you do not have HIV, but are at risk, it may be recommended that you take a prescription medicine daily to prevent HIV infection. This is called pre-exposure prophylaxis (PrEP). You are considered at risk if:  You are sexually active and do not regularly use condoms or know the HIV status of your partner(s).  You take drugs by injection.  You are sexually active with a partner who has HIV. Talk with your health care provider about whether you are at high risk of being infected with HIV. If you choose to begin PrEP, you should first be tested for HIV. You should then be tested every 3 months for as long as you are taking PrEP.  PREGNANCY    If you are premenopausal and you may become pregnant, ask your health care provider about preconception counseling.  If you may become pregnant, take 400 to 800 micrograms (mcg) of folic acid every day.  If you want to prevent pregnancy, talk to your health care provider about birth control (contraception). OSTEOPOROSIS AND MENOPAUSE   Osteoporosis is a disease in which the bones lose minerals and strength with aging. This can result in serious bone fractures. Your risk for osteoporosis can be identified using a bone density scan.  If you are 65 years of age or older, or if you are at risk for osteoporosis and fractures, ask your health care provider if you should be screened.  Ask your health care provider whether you should take   a calcium or vitamin D supplement to lower your risk for osteoporosis.  Menopause may have certain physical symptoms and risks.  Hormone replacement therapy may reduce some of these symptoms and risks. Talk to your health care provider about whether hormone replacement therapy is right for you.  HOME CARE INSTRUCTIONS   Schedule regular health, dental, and eye exams.  Stay current with your immunizations.   Do not use any tobacco products including cigarettes, chewing tobacco, or electronic cigarettes.  If you are pregnant, do not drink alcohol.  If you are breastfeeding, limit how much and how often you drink alcohol.  Limit alcohol intake to no more than 1 drink per day for nonpregnant women. One drink equals 12 ounces of beer, 5 ounces of wine, or 1 ounces of hard liquor.  Do not use street drugs.  Do not share needles.  Ask your health care provider for help if you need support or information about quitting drugs.  Tell your health care provider if you often feel depressed.  Tell your health care provider if you have ever been abused or do not feel safe at home.   This information is not intended to replace advice given to you by your  health care provider. Make sure you discuss any questions you have with your health care provider.   Document Released: 05/31/2011 Document Revised: 12/06/2014 Document Reviewed: 10/17/2013 Elsevier Interactive Patient Education 2016 [employer] Inc[employer]  

## 2016-03-30 NOTE — Progress Notes (Signed)
I have reviewed this visit and discussed with [facility], and agree with her documentation

## 2016-03-31 ENCOUNTER — Ambulatory Visit (INDEPENDENT_AMBULATORY_CARE_PROVIDER_SITE_OTHER): Payer: Medicare Other | Admitting: Family Medicine

## 2016-03-31 ENCOUNTER — Encounter: Payer: Self-pay | Admitting: Family Medicine

## 2016-03-31 VITALS — BP 129/70 | HR 70 | Temp 97.5°F | Ht 62.0 in | Wt 214.9 lb

## 2016-03-31 DIAGNOSIS — I10 Essential (primary) hypertension: Secondary | ICD-10-CM | POA: Diagnosis not present

## 2016-03-31 DIAGNOSIS — E785 Hyperlipidemia, unspecified: Secondary | ICD-10-CM

## 2016-03-31 DIAGNOSIS — M21969 Unspecified acquired deformity of unspecified lower leg: Secondary | ICD-10-CM

## 2016-03-31 DIAGNOSIS — E1169 Type 2 diabetes mellitus with other specified complication: Secondary | ICD-10-CM | POA: Diagnosis not present

## 2016-03-31 LAB — POCT GLYCOSYLATED HEMOGLOBIN (HGB A1C): HEMOGLOBIN A1C: 5.7

## 2016-03-31 LAB — LDL CHOLESTEROL, DIRECT: Direct LDL: 36 mg/dL (ref <130)

## 2016-03-31 NOTE — Patient Instructions (Signed)
Good to see you today!  Thanks for coming in.  Bring your blood pressure machine to your cardiologist appointment so they can check it and compare readings  If your blood pressure at home is regularly > 140/90 on your machine then come here for a blood pressure check and bring your machine  I will call you if your tests are not good.  Otherwise I will send you a letter.  If you do not hear from me with in 2 weeks please call our office.     Keep taking all the medications as your are  If you have a gout flare then call me  Try to get your weight below 200 lbs in the next 3-6 month  Come back in 3 month for an A1c check

## 2016-04-01 ENCOUNTER — Encounter: Payer: Self-pay | Admitting: Family Medicine

## 2016-04-01 NOTE — Assessment & Plan Note (Signed)
BP Readings from Last 3 Encounters:  03/31/16 129/70  03/30/16 144/78  02/18/16 138/82   Good control continue current medications

## 2016-04-01 NOTE — Assessment & Plan Note (Signed)
Good control.  Continue current lipitor

## 2016-04-01 NOTE — Assessment & Plan Note (Signed)
Good control.  Maybe able to lower dose of metformin if next A1c is good

## 2016-04-01 NOTE — Progress Notes (Signed)
   Subjective:    Patient ID: [name], female    DOB: 03/19/1951, 65 y.o.   MRN: [id number]  HPI  HYPERTENSION Disease Monitoring: Blood pressure range-Not checking Chest pain, palpitations- no      Dyspnea- no Medications: Compliance- good Lightheadedness,Syncope- no   Edema- no  DIABETES Disease Monitoring: Blood Sugar ranges-not checking Polyuria/phagia/dipsia- no      Visual problems- no Medications: Compliance- metformin Hypoglycemic symptoms- no  HYPERLIPIDEMIA Disease Monitoring: See symptoms for Hypertension Medications: Compliance- Lipitor daily Right upper quadrant pain- no  Muscle aches- no  Monitoring Labs and Parameters Last A1C:  Lab Results  Component Value Date   HGBA1C 5.7 03/31/2016    Last Lipid:     Component Value Date/Time   CHOL 229* 01/07/2016 1635   HDL 39* 01/07/2016 1635   LDLDIRECT 36 03/31/2016 1027    Last Bmet  POTASSIUM  Date Value Ref Range Status  02/03/2016 4.6 3.5 - 5.1 mEq/L Final  01/19/2016 4.8 3.5 - 5.3 mmol/L Final   SODIUM  Date Value Ref Range Status  02/03/2016 140 136 - 145 mEq/L Final  01/19/2016 140 135 - 146 mmol/L Final   CREATININE  Date Value Ref Range Status  02/03/2016 1.5* 0.6 - 1.1 mg/dL Final   CREAT  Date Value Ref Range Status  01/19/2016 1.68* 0.50 - 0.99 mg/dL Final   CREATININE, SER  Date Value Ref Range Status  01/09/2016 1.41* 0.44 - 1.00 mg/dL Final      Last BPs:  BP Readings from Last 3 Encounters:  03/31/16 129/70  03/30/16 144/78  02/18/16 138/82    Chief Complaint noted Review of Symptoms - see HPI PMH - Smoking status noted.   Vital Signs reviewed    Review of Systems     Objective:   Physical Exam   Alert nad Able to get up and down from exam table without help      Assessment & Plan:

## 2016-04-10 IMAGING — NM NM MISC PROCEDURE
6 series · 36 of 36 positions shown · non-contrast
Comparison: none

[Series 1: wbr rest · 6.40mm/px · 6 of 64 frames shown]
[frame 6/64]
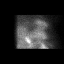
[frame 16/64]
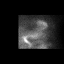
[frame 27/64]
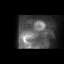
[frame 38/64]
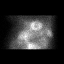
[frame 48/64]
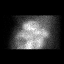
[frame 59/64]
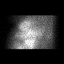

[Series 1: wbr_r-proj_st wbr rest · 6.40mm/px · 6 of 64 frames shown]
[frame 6/64]
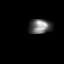
[frame 16/64]
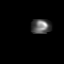
[frame 27/64]
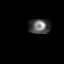
[frame 38/64]
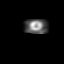
[frame 48/64]
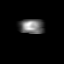
[frame 59/64]
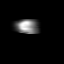

[Series 2: wbr_s-proj_st wbr stress-gsp · 6.40mm/px · 6 of 512 frames shown]
[frame 43/512]
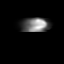
[frame 128/512]
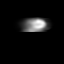
[frame 214/512]
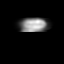
[frame 299/512]
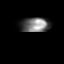
[frame 384/512]
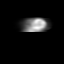
[frame 470/512]
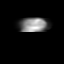

[Series 2: wbr stress-gsp · 6.40mm/px · 6 of 500 frames shown]
[frame 42/500  full-range]
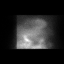
[frame 125/500  full-range]
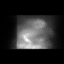
[frame 209/500  full-range]
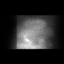
[frame 292/500  full-range]
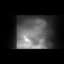
[frame 375/500  full-range]
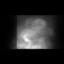
[frame 459/500  full-range]
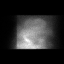

[Series 3: wbr stress-sum-em · 6.40mm/px · 6 of 64 frames shown]
[frame 6/64]
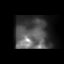
[frame 16/64]
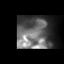
[frame 27/64]
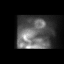
[frame 38/64]
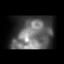
[frame 48/64]
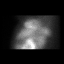
[frame 59/64]
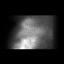

[Series 3: wbr_s-proj_st wbr stress-sum-em · 6.40mm/px · 6 of 64 frames shown]
[frame 6/64]
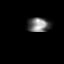
[frame 16/64]
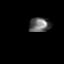
[frame 27/64]
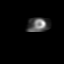
[frame 38/64]
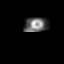
[frame 48/64]
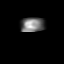
[frame 59/64]
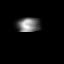

[36 of 36 positions shown; findings below may reference images not displayed]

Canned report from images found in remote index.

Refer to host system for actual result text.

## 2016-04-16 ENCOUNTER — Other Ambulatory Visit: Payer: Self-pay | Admitting: *Deleted

## 2016-04-16 MED ORDER — POTASSIUM CITRATE ER 10 MEQ (1080 MG) PO TBCR: 10.0000 meq | EXTENDED_RELEASE_TABLET | Freq: Two times a day (BID) | ORAL | Status: DC

## 2016-04-20 ENCOUNTER — Encounter: Payer: Self-pay | Admitting: Family Medicine

## 2016-04-20 ENCOUNTER — Ambulatory Visit (INDEPENDENT_AMBULATORY_CARE_PROVIDER_SITE_OTHER): Payer: Medicare Other | Admitting: Family Medicine

## 2016-04-20 VITALS — BP 100/71 | HR 89 | Temp 98.1°F | Wt 214.0 lb

## 2016-04-20 DIAGNOSIS — M25531 Pain in right wrist: Secondary | ICD-10-CM

## 2016-04-20 LAB — URIC ACID: URIC ACID, SERUM: 10.2 mg/dL — AB (ref 2.4–7.0)

## 2016-04-20 MED ORDER — COLCHICINE 0.6 MG PO TABS: 0.6000 mg | ORAL_TABLET | Freq: Every day | ORAL | Status: DC

## 2016-04-20 NOTE — Patient Instructions (Signed)
Thank you for coming to see me today. It was a pleasure. Today we talked about:   Wrist pain: this could be gout. I will treat you with colchicine. If you start developing muscle aches, please stop the colchicine and let me know as I may need to switch you to a steroid. If no improvement in the next few days, please let me know. I am checking your uric acid level; please expect a letter in the mail.  Please make an appointment to see [name] for your regular follow-up.  If you have any questions or concerns, please do not hesitate to call the office at [phone].  Sincerely,  Cordelia Poche, MD

## 2016-04-20 NOTE — Progress Notes (Signed)
    Subjective   [name] is a 65 y.o. female that presents for a same day visit  1. Right wrist pain: Symptoms started yesterday. She describes the pain as achy and sharp located in her wrist base of hand. Moving her hand worsens her pain. She has associated swelling with mild warmth and no erythema. She has been applying warm and cold compress which have not helped. She has tried Tylenol 650mg  and gabapentin 400mg  which have not helped. She has a history of gout and was recently taken off about 6 months ago since she was attack-free for 2 years.  ROS Per HPI  Social History  Substance Use Topics  . Smoking status: Never Smoker   . Smokeless tobacco: Never Used  . Alcohol Use: No    No Known Allergies  Objective   BP 100/71 mmHg  Pulse 89  Temp(Src) 98.1 F (36.7 C) (Oral)  Wt 214 lb (97.07 kg)  General: patient is well-appearing, in a wheelchair Musculoskeletal: right lower wrist is tender, with minimal swelling and is warm. No erythema. She is able to move her wrist with diminished range of motion compared to the left hand. She is not able to fully close her hand secondary to pain. Skin: no surrounding erythema  Assessment and Plan   1. Right wrist pain Favor possible gout flare. Patient was recently taken off colchicine. She states this occurred 6 months ago but recently filled a prescription for colchicine in February. Wrist fracture is low on differential since patient reports no traumatic injury. No evidence of a cellulitis. Will treat as a gout flare and obtain a uric acid. Patient may need to turn to prophylactic therapy with colchicine. - colchicine 0.6 MG tablet; Take 1 tablet (0.6 mg total) by mouth daily.  Dispense: 60 tablet; Refill: 0 - Uric Acid

## 2016-04-21 ENCOUNTER — Encounter: Payer: Self-pay | Admitting: Family Medicine

## 2016-04-23 ENCOUNTER — Encounter: Payer: Self-pay | Admitting: Podiatry

## 2016-04-23 ENCOUNTER — Ambulatory Visit (INDEPENDENT_AMBULATORY_CARE_PROVIDER_SITE_OTHER): Payer: Medicare Other | Admitting: Podiatry

## 2016-04-23 ENCOUNTER — Ambulatory Visit (INDEPENDENT_AMBULATORY_CARE_PROVIDER_SITE_OTHER): Payer: Medicare Other

## 2016-04-23 VITALS — BP 120/78 | HR 71 | Resp 14

## 2016-04-23 DIAGNOSIS — M129 Arthropathy, unspecified: Secondary | ICD-10-CM

## 2016-04-23 DIAGNOSIS — R52 Pain, unspecified: Secondary | ICD-10-CM

## 2016-04-23 DIAGNOSIS — E11610 Type 2 diabetes mellitus with diabetic neuropathic arthropathy: Secondary | ICD-10-CM

## 2016-04-23 NOTE — Progress Notes (Signed)
Subjective:     Patient ID: [name], female   DOB: 12-15-1950, 65 y.o.   MRN: [id number]  HPI this patient presents the office this morning stating that she's having problems with both her right and her left feet. She says that her right foot is more painful at this time than her left foot. She states that it is painful for her to walk and this pain has been increasing for months if not years. She has difficulty placing weight onto her foot as she walks. She says both of her ankles are severely painful and swollen. She has been undergoing a diagnosed gout attack in her right wrist which prevents her from wearing proper footgear. She presents to the office today for an evaluation and treatment of her painful feet   Review of Systems     Objective:   Physical Exam GENERAL APPEARANCE: Alert, conversant. Appropriately groomed. No acute distress.  VASCULAR: Pedal pulses are  palpable at  [facility] and PT bilateral.  Capillary refill time is immediate to all digits,  Normal temperature gradient.  Digital hair growth is present bilateral  NEUROLOGIC: sensation is normal to 5.07 monofilament at 5/5 sites bilateral.  Light touch is intact bilateral, Muscle strength normal.  MUSCULOSKELETAL: acceptable muscle strength, tone and stability bilateral.  There is 3+ swelling right and left foot and ankle..  Palpale pain at insertion of achilles tendon and insertion plantar fascia B/L. No increased temperature both feet. Asymptomatic HAV deformity B/L.   DERMATOLOGIC: skin color, texture, and turgor are within normal limits.  No preulcerative lesions or ulcers  are seen, no interdigital maceration noted.  No open lesions present.  Digital nails are asymptomatic. No drainage noted.      Assessment:     Charcot foot B/L  Foot Sprain  B/L  Arthritis feet B/L     Plan:   ROV  Xray reveal arthritic changes and charcot foot deformity.  Treat this patient with unna boot and wooden shoe right foot and ankle.  RTC 10  days   Gardiner Barefoot DPM

## 2016-04-24 ENCOUNTER — Other Ambulatory Visit: Payer: Self-pay | Admitting: Hematology

## 2016-04-29 DIAGNOSIS — C50111 Malignant neoplasm of central portion of right female breast: Secondary | ICD-10-CM | POA: Diagnosis not present

## 2016-04-29 DIAGNOSIS — N183 Chronic kidney disease, stage 3 (moderate): Secondary | ICD-10-CM | POA: Diagnosis not present

## 2016-04-29 DIAGNOSIS — Z9011 Acquired absence of right breast and nipple: Secondary | ICD-10-CM | POA: Diagnosis not present

## 2016-04-29 DIAGNOSIS — M21969 Unspecified acquired deformity of unspecified lower leg: Secondary | ICD-10-CM | POA: Diagnosis not present

## 2016-04-29 DIAGNOSIS — E1169 Type 2 diabetes mellitus with other specified complication: Secondary | ICD-10-CM | POA: Diagnosis not present

## 2016-04-30 DIAGNOSIS — H538 Other visual disturbances: Secondary | ICD-10-CM | POA: Diagnosis not present

## 2016-04-30 DIAGNOSIS — E119 Type 2 diabetes mellitus without complications: Secondary | ICD-10-CM | POA: Diagnosis not present

## 2016-04-30 DIAGNOSIS — H2513 Age-related nuclear cataract, bilateral: Secondary | ICD-10-CM | POA: Diagnosis not present

## 2016-04-30 LAB — HM DIABETES EYE EXAM

## 2016-05-07 ENCOUNTER — Other Ambulatory Visit: Payer: Self-pay

## 2016-05-07 ENCOUNTER — Ambulatory Visit (HOSPITAL_COMMUNITY): Payer: Medicare Other | Attending: Cardiology

## 2016-05-07 DIAGNOSIS — E119 Type 2 diabetes mellitus without complications: Secondary | ICD-10-CM | POA: Diagnosis not present

## 2016-05-07 DIAGNOSIS — R9430 Abnormal result of cardiovascular function study, unspecified: Secondary | ICD-10-CM | POA: Insufficient documentation

## 2016-05-07 DIAGNOSIS — I509 Heart failure, unspecified: Secondary | ICD-10-CM | POA: Diagnosis not present

## 2016-05-07 DIAGNOSIS — I110 Hypertensive heart disease with heart failure: Secondary | ICD-10-CM | POA: Diagnosis not present

## 2016-05-07 DIAGNOSIS — E669 Obesity, unspecified: Secondary | ICD-10-CM | POA: Diagnosis not present

## 2016-05-07 DIAGNOSIS — E785 Hyperlipidemia, unspecified: Secondary | ICD-10-CM | POA: Diagnosis not present

## 2016-05-07 DIAGNOSIS — I071 Rheumatic tricuspid insufficiency: Secondary | ICD-10-CM | POA: Diagnosis not present

## 2016-05-07 DIAGNOSIS — Z6839 Body mass index (BMI) 39.0-39.9, adult: Secondary | ICD-10-CM | POA: Diagnosis not present

## 2016-05-07 DIAGNOSIS — I340 Nonrheumatic mitral (valve) insufficiency: Secondary | ICD-10-CM | POA: Insufficient documentation

## 2016-05-07 LAB — ECHOCARDIOGRAM COMPLETE
Ao-asc: 32 cm
CHL CUP RV SYS PRESS: 34 mmHg
CHL CUP STROKE VOLUME: 33 mL
EERAT: 17.69
EWDT: 239 ms
FS: 16 % — AB (ref 28–44)
IV/PV OW: 1
LA vol A4C: 83 ml
LA vol index: 41.2 mL/m2
LADIAMINDEX: 1.66 cm/m2
LASIZE: 35 mm
LAVOL: 87 mL
LDCA: 3.8 cm2
LEFT ATRIUM END SYS DIAM: 35 mm
LV E/e' medial: 17.69
LV PW d: 9.91 mm — AB (ref 0.6–1.1)
LV sys vol index: 28 mL/m2
LVDIAVOL: 93 mL (ref 46–106)
LVDIAVOLIN: 44 mL/m2
LVEEAVG: 17.69
LVELAT: 5.36 cm/s
LVOT SV: 64 mL
LVOT VTI: 16.8 cm
LVOT diameter: 22 mm
LVOT peak grad rest: 2 mmHg
LVOTPV: 73.7 cm/s
LVSYSVOL: 60 mL — AB (ref 14–42)
MV Dec: 239
MV pk A vel: 134 m/s
MV pk E vel: 94.8 m/s
MVPG: 4 mmHg
RV LATERAL S' VELOCITY: 10.2 cm/s
Reg peak vel: 277 cm/s
S' Lateral: 10.2 cm/s
Simpson's disk: 35
TDI e' lateral: 5.36
TDI e' medial: 5.56
TR max vel: 277 cm/s

## 2016-05-11 ENCOUNTER — Telehealth: Payer: Self-pay | Admitting: *Deleted

## 2016-05-11 ENCOUNTER — Encounter: Payer: Self-pay | Admitting: Cardiovascular Disease

## 2016-05-11 ENCOUNTER — Ambulatory Visit (INDEPENDENT_AMBULATORY_CARE_PROVIDER_SITE_OTHER): Payer: Medicare Other | Admitting: Cardiovascular Disease

## 2016-05-11 VITALS — BP 130/84 | HR 78 | Ht 62.0 in | Wt 218.6 lb

## 2016-05-11 DIAGNOSIS — I5042 Chronic combined systolic (congestive) and diastolic (congestive) heart failure: Secondary | ICD-10-CM | POA: Diagnosis not present

## 2016-05-11 DIAGNOSIS — E785 Hyperlipidemia, unspecified: Secondary | ICD-10-CM

## 2016-05-11 DIAGNOSIS — I110 Hypertensive heart disease with heart failure: Secondary | ICD-10-CM

## 2016-05-11 DIAGNOSIS — E781 Pure hyperglyceridemia: Secondary | ICD-10-CM

## 2016-05-11 NOTE — Telephone Encounter (Signed)
Will forward to MD to advise.  Patient just had a direct LDL in may and a complete panel in February while she was inpatient. Consepcion Utt,CMA

## 2016-05-11 NOTE — Progress Notes (Signed)
Cardiology Office Note   Date:  [id number]   ID:  ANYE WAYNER, DOB [id number], MRN [id number]  PCP:  Lind Covert, MD  Cardiologist:   Skeet Latch, MD   Chief Complaint  Patient presents with  . Follow-up    3 months post ECHO  pt states no new Sx.      History of Present Illness: [name] is a 65 y.o. female with hypertension, chronic diastolic heart failure (LVEF improved to 50-55%), hypertension, diabetes, hyperlipidemia, breast cancer s/p lumpectomy and radiation, and asthma who presents for management of heart failure.  [name] presented to the ED 01/2016 with chest pain, shortness of breath and dizziness.  She was admitted to the hospital and had negative cardiac enzymes.  Telemetry revealed sinus rhythm with frequent PVCs.  BNP was not elevated.  Her echo was repeated and revealed LVEF 30-35% with diffuse hypokinesis and grade 1 diastolic dysfunction.  She received IV lasix and was started on po lasix at discharge.  She was already on a beta blocker and ACE-I on admission.  She was referred for a Lexiscan Cardiolite that revealed LVEF 52% and no ischemia. She had a repeat echo on 05/07/16 with LVEF 50-55%.  Of note, in 2013 [name] had systolic heart failure LVEF 20-25% and grade 2 diastolic dysfunction.  She had a a heart cath that revealed normal LVEF.  Her repeat echo 06/2014 revealed LVEF 55-60%, no wall motion abnormalities or diastolic dysfunction.       Since being discharged from the hospital [name] has been doing very well. She notes some aches and pains but otherwise is without complaint. She does get some shortness of breath when walking long distances but denies any chest pain. She also denies orthopnea or PND. She has chronic lower extremity edema that is improved with Lasix. She thinks she gets a good urinary response. In February her triglycerides were noted to be elevated. She denies heavy intake of carbohydrates and does not drink  alcohol.   Past Medical History  Diagnosis Date  . Hypertension   . Diabetes mellitus   . Depression   . Asthma   . Eczema   . Arthritis   . Cataract   . GERD (gastroesophageal reflux disease)   . History of kidney stones     w/ hx of hydronephrosis - followed by [facility]  . Anemia   . Obesity   . HIV nonspecific serology     2006: indeterminate HIV blood test, seen by ID, felt secondary to cross reacting antibodies with no further workup felt necessary at that time   . Fatty liver     Fatty infiltration of liver noted on 03/2012 CT scan  . CHF, acute ([place]) 05/03/2012  . Breast cancer of lower-outer quadrant of right female breast ([place]) 02/13/2015  . Radiation 05/07/15-06/23/15    Right Breast  . Fibromyalgia   . Heart disease     Past Surgical History  Procedure Laterality Date  . Cystoscopy w/ litholapaxy / ehl    . Cholecystectomy  2003  . Replacement total knee bilateral  2005 &2006  . Joint replacement      bilateral knee replacement  . Vascular surgery Right 03/15/2013    Ultrasound guided sclerotherapy  . Left and right heart catheterization with coronary angiogram N/A 05/02/2012    Procedure: LEFT AND RIGHT HEART CATHETERIZATION WITH CORONARY ANGIOGRAM;  Surgeon: Burnell Blanks, MD;  Location: [facility] CATH LAB;  Service:  Cardiovascular;  Laterality: N/A;  . Radioactive seed guided mastectomy with axillary sentinel lymph node biopsy Right 03/21/2015    Procedure: RIGHT  PARTIAL MASTECTOMY WITH RADIOACTIVE SEED LOCALIZATION RIGHT  AXILLARY SENTINEL LYMPH NODE BIOPSY;  Surgeon: Fanny Skates, MD;  Location: [facility];  Service: General;  Laterality: Right;     Current Outpatient Prescriptions  Medication Sig Dispense Refill  . acetaminophen (TYLENOL 8 HOUR ARTHRITIS PAIN) 650 MG CR tablet Take 650 mg by mouth every 8 (eight) hours as needed for pain.    Marland Kitchen albuterol (PROVENTIL HFA;VENTOLIN HFA) 108 (90 Base) MCG/ACT inhaler Inhale 2 puffs into the  lungs every 6 (six) hours as needed for wheezing. 1 Inhaler 3  . aspirin 81 MG tablet Take 81 mg by mouth daily.    Marland Kitchen atorvastatin (LIPITOR) 40 MG tablet Take 1 tablet (40 mg total) by mouth daily. 90 tablet 3  . benazepril (LOTENSIN) 20 MG tablet take 1 tablet by mouth once daily 90 tablet 3  . colchicine 0.6 MG tablet Take 1 tablet (0.6 mg total) by mouth daily. 60 tablet 0  . exemestane (AROMASIN) 25 MG tablet take 1 tablet by mouth once daily AFTER BREAKFAST 30 tablet 2  . fluticasone (FLOVENT HFA) 110 MCG/ACT inhaler Inhale 2 puffs into the lungs 2 (two) times daily. 12 g 6  . furosemide (LASIX) 40 MG tablet Take 1 tablet (20 mg) daily. 90 tablet 0  . gabapentin (NEURONTIN) 100 MG capsule 3-4 tabs three times a day 450 capsule 6  . meloxicam (MOBIC) 15 MG tablet Take 1 tablet (15 mg total) by mouth daily. 30 tablet 0  . metFORMIN (GLUCOPHAGE) 1000 MG tablet Take 1 tablet (1,000 mg total) by mouth 2 (two) times daily with a meal. 180 tablet 1  . metoprolol succinate (TOPROL-XL) 25 MG 24 hr tablet Take 0.5 tablets (12.5 mg total) by mouth daily. 45 tablet 1  . potassium citrate (UROCIT-K) 10 MEQ (1080 MG) SR tablet Take 1 tablet (10 mEq total) by mouth 2 (two) times daily. 60 tablet 6  . ranitidine (ZANTAC) 150 MG tablet TAKE 1 TABLET BY MOUTH AT BEDTIME 30 tablet 11  . spironolactone (ALDACTONE) 25 MG tablet   0   No current facility-administered medications for this visit.    Allergies:   Review of patient's allergies indicates no known allergies.    Social History:  The patient  reports that she has never smoked. She has never used smokeless tobacco. She reports that she does not drink alcohol or use illicit drugs.   Family History:  The patient's family history includes Anemia in her father; Cancer (age of onset: 66) in her sister; Cancer (age of onset: 13) in her cousin; Cancer (age of onset: 86) in her cousin; Diabetes in her mother; Heart disease in her father; Stroke in her mother.     ROS:  Please see the history of present illness.   Otherwise, review of systems are positive for R heel pain.   All other systems are reviewed and negative.    PHYSICAL EXAM: VS:  BP 130/84 mmHg  Pulse 78  Ht 5\' 2"  (1.575 m)  Wt 218 lb 9.6 oz (99.156 kg)  BMI 39.97 kg/m2 , BMI Body mass index is 39.97 kg/(m^2). GENERAL:  Well appearing HEENT:  Pupils equal round and reactive, fundi not visualized, oral mucosa unremarkable NECK:  No jugular venous distention, waveform within normal limits, carotid upstroke brisk and symmetric, no bruits, no thyromegaly LYMPHATICS:  No  cervical adenopathy LUNGS:  Clear to auscultation bilaterally HEART:  RRR.  PMI not displaced or sustained,S1 and S2 within normal limits, no S3, no S4, no clicks, no rubs, no murmurs ABD:  Flat, positive bowel sounds normal in frequency in pitch, no bruits, no rebound, no guarding, no midline pulsatile mass, no hepatomegaly, no splenomegaly EXT:  2 plus pulses throughout, no edema, no cyanosis no clubbing.  R foot in Cam boot. SKIN:  No rashes no nodules NEURO:  Cranial nerves II through XII grossly intact, motor grossly intact throughout PSYCH:  Cognitively intact, oriented to person place and time  EKG:  EKG is not ordered today. The ekg ordered today demonstrates sinus rhythm rate 84 bpm.  Ventricular trigeminy.    Echo 05/07/16: Study Conclusions  - Left ventricle: The cavity size was normal. Systolic function was  normal. The estimated ejection fraction was in the range of 50%  to 55%. Wall motion was normal; there were no regional wall  motion abnormalities. Doppler parameters are consistent with  abnormal left ventricular relaxation (grade 1 diastolic  dysfunction). Doppler parameters are consistent with elevated  ventricular end-diastolic filling pressure. - Aortic root: The aortic root was normal in size. - Mitral valve: Structurally normal valve. Transvalvular velocity  was within the normal  range. There was no evidence for stenosis.  There was mild regurgitation. - Left atrium: The atrium was mildly dilated. - Right ventricle: The cavity size was normal. Wall thickness was  normal. Systolic function was normal. - Right atrium: The atrium was normal in size. - Tricuspid valve: There was mild regurgitation. - Pulmonic valve: There was no regurgitation. - Pulmonary arteries: Systolic pressure was within the normal  range. PA peak pressure: 34 mm Hg (S). - Inferior vena cava: The vessel was normal in size. - Pericardium, extracardiac: There was no pericardial effusion.  Impressions:  - When compared to the prior study from 01/08/2016 LVEF has improved,  now low normal estimated at 50-55%.  Lexiscan Cardiolote 02/03/16:  The left ventricular ejection fraction is mildly decreased (45-54%).  There was no ST segment deviation noted during stress.  This is a low risk study.  Low risk stress nuclear study with small, mild, fixed basal anterior and inferior defects consistent with attenuation; no ischemia; EF 52 with normal wall motion.       Recent Labs: 01/07/2016: B Natriuretic Peptide 66.4; Magnesium 2.0; TSH 1.228 02/03/2016: ALT 33; BUN 18.0; Creatinine 1.5*; HGB 10.4*; Platelets 165; Potassium 4.6; Sodium 140    Lipid Panel    Component Value Date/Time   CHOL 229* 01/07/2016 1635   TRIG 437* 01/07/2016 1635   HDL 39* 01/07/2016 1635   CHOLHDL 5.9 01/07/2016 1635   VLDL UNABLE TO CALCULATE IF TRIGLYCERIDE OVER 400 mg/dL 01/07/2016 1635   LDLCALC UNABLE TO CALCULATE IF TRIGLYCERIDE OVER 400 mg/dL 01/07/2016 1635   LDLDIRECT 36 03/31/2016 1027      Wt Readings from Last 3 Encounters:  05/11/16 218 lb 9.6 oz (99.156 kg)  04/20/16 214 lb (97.07 kg)  03/31/16 214 lb 14.4 oz (97.478 kg)      ASSESSMENT AND PLAN:  # Acute on chronic systolic heart failure: [name] Systolic function has recovered to near normal. Interestingly, this also happened in  2013. I reviewed her echo personally and it does not appear to be toxic febrile physiology. 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 We will continue her on lifelong beta blocker and ACE inhibitor  to help prevent this from recurring in the future. Continue furosemide for volume management. She is euvolemic on exam today.    # Chest pain:  Lexiscan Cardiolite  was negative for ischemia. She is not having any chest pain at this time.   # Hyperlipidemia: Atorvastatin was started in the hospital in February. She was noted to have hypertriglyceridemia at that time. LDL could not be calculated. She has our aid in today and will have fasting lipids and LFTs drawn with her PCP. We will ask that they fax these results to [place] to determine if her atorvastatin to be changed and if she needs to start fenofibrate.  # Hypertensive heart disease:  BP well-controlled.  Continue current regimen.  Current medicines are reviewed at length with the patient today.  The patient does not have concerns regarding medicines.  The following changes have been made:  no change  Labs/ tests ordered today include:   Orders Placed This Encounter  Procedures  . Lipid panel     Disposition:   FU with Sherronda Sweigert C. Oval Linsey, MD, [facility] in 6 months.   This note was written with the assistance of speech recognition software.  Please excuse any transcriptional errors.  Signed, Ana Liaw C. Oval Linsey, MD, [facility]  05/11/2016 9:58 AM    [facility]

## 2016-05-11 NOTE — Patient Instructions (Signed)
Medication Instructions:   NO CHANGE  Labwork:  Your physician recommends that you return for lab work [place] DOCTOR= DO NOT EAT PRIOR TO HAVING LAB WORK DRAWN  Follow-Up:  Your physician wants you to follow-up in: [place] [address] [facility] You will receive a reminder letter in the mail two months in advance. If you don't receive a letter, please call our office to schedule the follow-up appointment.   If you need a refill on your cardiac medications before your next appointment, please call your pharmacy.

## 2016-05-11 NOTE — Telephone Encounter (Signed)
Patient states she was seen by her cardiologist today and they wanted her to have her Cholesterol checked by her PCP to determine if her atorvastatin needs to be changed. Patient requesting for MD to place order for lab.

## 2016-05-11 NOTE — Telephone Encounter (Signed)
Please let her know we checked it in May and that her cholesterol was good.  We will need to recheck a fasting cholesterol the next time she comes in  [place]

## 2016-05-12 NOTE — Telephone Encounter (Signed)
Left a detailed message on pts VM. I told her we could fax her May results to her cardiologist, and to have her come back for a fasting lab. Page, cma.

## 2016-05-14 ENCOUNTER — Ambulatory Visit (INDEPENDENT_AMBULATORY_CARE_PROVIDER_SITE_OTHER): Payer: Medicare Other | Admitting: Podiatry

## 2016-05-14 ENCOUNTER — Encounter: Payer: Self-pay | Admitting: Podiatry

## 2016-05-14 VITALS — BP 123/69 | HR 80 | Resp 16

## 2016-05-14 DIAGNOSIS — M21969 Unspecified acquired deformity of unspecified lower leg: Secondary | ICD-10-CM | POA: Diagnosis not present

## 2016-05-14 DIAGNOSIS — E11610 Type 2 diabetes mellitus with diabetic neuropathic arthropathy: Secondary | ICD-10-CM | POA: Diagnosis not present

## 2016-05-14 DIAGNOSIS — E1169 Type 2 diabetes mellitus with other specified complication: Secondary | ICD-10-CM | POA: Diagnosis not present

## 2016-05-14 DIAGNOSIS — E1140 Type 2 diabetes mellitus with diabetic neuropathy, unspecified: Secondary | ICD-10-CM | POA: Diagnosis not present

## 2016-05-14 DIAGNOSIS — M129 Arthropathy, unspecified: Secondary | ICD-10-CM

## 2016-05-14 NOTE — Progress Notes (Signed)
Subjective:     Patient ID: [name], female   DOB: 08/21/1951, 65 y.o.   MRN: [id number]  HPI this patient presents to the office follow-up for diagnosis of acute arthropathy of the right foot. Patient was seen in the office 10 days ago and had severe swelling, pain and increased temperature in her right rear foot. This patient is diabetic and has history of Charcot foot in her left foot. She was treated with an Haematologist and a surgical shoe for her to wear to help eliminate the acute pain and swelling in the right foot. She could be experiencing an early Charcot foot. She presents the office today stating that she still in pain. She presents for continued evaluation and treatment of her right foot   Review of Systems     Objective:   Physical Exam     Physical Exam GENERAL APPEARANCE: Alert, conversant. Appropriately groomed. No acute distress.  VASCULAR: Pedal pulses are palpable at [facility] and PT bilateral. Capillary refill time is immediate to all digits, Normal temperature gradient. Digital hair growth is present bilateral  NEUROLOGIC: sensation is normal to 5.07 monofilament at 5/5 sites bilateral. Light touch is intact bilateral, Muscle strength normal.  MUSCULOSKELETAL: acceptable muscle strength, tone and stability bilateral. There is 3+ swelling right and left foot and ankle.. Palpale pain at insertion of achilles tendon and insertion plantar fascia B/L. No increased temperature both feet. Asymptomatic HAV deformity B/L.   DERMATOLOGIC: skin color, texture, and turgor are within normal limits. No preulcerative lesions or ulcers are seen, no interdigital maceration noted. No open lesions present. Digital nails are asymptomatic. No drainage noted            Assessment:     Arthropathy Feet B/L  Plantar fascitis right foot  Achilles tendinitis    Plan:     ROV.  Examination of her right foot reveals decreased redness, swelling and pain at the Achilles insertion and  at the plantar fascial insertion. There is no increased temperature noted at this time. The patient states that the pain continues despite the fact that the foot has improved from her acute pain.. I explained to the patient that the acute pain that she had is gone due to the cast that was applied to her right foot. The pain that she still experiences is due to the chronic arthritis in her foot as well as neuropathy that she experiences due to her diabetes .  If this acute pain returns he is to return to the office for treatment.  If chronic pain continues. She should also make an appointment for the doctors treating medical conditions.     Gardiner Barefoot DPM

## 2016-05-16 ENCOUNTER — Other Ambulatory Visit: Payer: Self-pay | Admitting: Family Medicine

## 2016-05-17 ENCOUNTER — Other Ambulatory Visit: Payer: Self-pay | Admitting: *Deleted

## 2016-05-17 MED ORDER — METOPROLOL SUCCINATE ER 25 MG PO TB24: 12.5000 mg | ORAL_TABLET | Freq: Every day | ORAL | Status: DC

## 2016-06-03 ENCOUNTER — Telehealth: Payer: Self-pay | Admitting: Hematology

## 2016-06-03 ENCOUNTER — Encounter: Payer: Self-pay | Admitting: Hematology

## 2016-06-03 ENCOUNTER — Other Ambulatory Visit (HOSPITAL_BASED_OUTPATIENT_CLINIC_OR_DEPARTMENT_OTHER): Payer: Medicare Other

## 2016-06-03 ENCOUNTER — Ambulatory Visit (HOSPITAL_BASED_OUTPATIENT_CLINIC_OR_DEPARTMENT_OTHER): Payer: Medicare Other | Admitting: Hematology

## 2016-06-03 VITALS — BP 165/87 | HR 80 | Temp 97.9°F | Resp 18 | Ht 62.0 in | Wt 220.0 lb

## 2016-06-03 DIAGNOSIS — E119 Type 2 diabetes mellitus without complications: Secondary | ICD-10-CM

## 2016-06-03 DIAGNOSIS — C50511 Malignant neoplasm of lower-outer quadrant of right female breast: Secondary | ICD-10-CM | POA: Diagnosis not present

## 2016-06-03 DIAGNOSIS — I10 Essential (primary) hypertension: Secondary | ICD-10-CM | POA: Diagnosis not present

## 2016-06-03 DIAGNOSIS — I509 Heart failure, unspecified: Secondary | ICD-10-CM

## 2016-06-03 DIAGNOSIS — N183 Chronic kidney disease, stage 3 (moderate): Secondary | ICD-10-CM | POA: Diagnosis not present

## 2016-06-03 DIAGNOSIS — M1990 Unspecified osteoarthritis, unspecified site: Secondary | ICD-10-CM

## 2016-06-03 DIAGNOSIS — I5042 Chronic combined systolic (congestive) and diastolic (congestive) heart failure: Secondary | ICD-10-CM

## 2016-06-03 LAB — COMPREHENSIVE METABOLIC PANEL
ALBUMIN: 3.9 g/dL (ref 3.5–5.0)
ALK PHOS: 124 U/L (ref 40–150)
ALT: 13 U/L (ref 0–55)
AST: 16 U/L (ref 5–34)
Anion Gap: 13 mEq/L — ABNORMAL HIGH (ref 3–11)
BILIRUBIN TOTAL: 0.38 mg/dL (ref 0.20–1.20)
BUN: 29 mg/dL — AB (ref 7.0–26.0)
CALCIUM: 9.7 mg/dL (ref 8.4–10.4)
CO2: 22 mEq/L (ref 22–29)
CREATININE: 1.3 mg/dL — AB (ref 0.6–1.1)
Chloride: 106 mEq/L (ref 98–109)
EGFR: 49 mL/min/{1.73_m2} — ABNORMAL LOW (ref >90)
Glucose: 105 mg/dl (ref 70–140)
Sodium: 141 mEq/L (ref 136–145)
TOTAL PROTEIN: 7.8 g/dL (ref 6.4–8.3)

## 2016-06-03 LAB — CBC WITH DIFFERENTIAL/PLATELET
BASO%: 0.5 % (ref 0.0–2.0)
BASOS ABS: 0.1 10*3/uL (ref 0.0–0.1)
EOS%: 1.8 % (ref 0.0–7.0)
Eosinophils Absolute: 0.2 10*3/uL (ref 0.0–0.5)
HEMATOCRIT: 34.9 % (ref 34.8–46.6)
HEMOGLOBIN: 11.1 g/dL — AB (ref 11.6–15.9)
LYMPH#: 3.5 10*3/uL — AB (ref 0.9–3.3)
LYMPH%: 32.6 % (ref 14.0–49.7)
MCH: 29.5 pg (ref 25.1–34.0)
MCHC: 31.8 g/dL (ref 31.5–36.0)
MCV: 92.8 fL (ref 79.5–101.0)
MONO#: 0.9 10*3/uL (ref 0.1–0.9)
MONO%: 8.5 % (ref 0.0–14.0)
NEUT#: 6.1 10*3/uL (ref 1.5–6.5)
NEUT%: 56.6 % (ref 38.4–76.8)
NRBC: 0 % (ref 0–0)
Platelets: 184 10*3/uL (ref 145–400)
RBC: 3.76 10*6/uL (ref 3.70–5.45)
RDW: 14.7 % — ABNORMAL HIGH (ref 11.2–14.5)
WBC: 10.7 10*3/uL — ABNORMAL HIGH (ref 3.9–10.3)

## 2016-06-03 MED ORDER — EXEMESTANE 25 MG PO TABS: ORAL_TABLET | ORAL | Status: DC

## 2016-06-03 NOTE — Progress Notes (Signed)
[name]  [phone] [phone]  Clinic follow up Note   Patient Care Team: Lind Covert, MD as PCP - General (Family Medicine) Gevena Cotton, MD (Ophthalmology) Rana Snare, MD (Urology) Dr. Edwin Cap (Home) Fanny Skates, MD as Consulting Physician (General Surgery) Thea Silversmith, MD as Consulting Physician (Radiation Oncology) Mauro Kaufmann, RN as Registered Nurse Rockwell Germany, RN as Registered Nurse Holley Bouche, NP as Nurse Practitioner (Nurse Practitioner) Truitt Merle, MD as Consulting Physician (Hematology) Sylvan Cheese, NP as Nurse Practitioner (Nurse Practitioner) Gardiner Barefoot, DPM (Podiatry) 06/03/2016  CHIEF COMPLAINTS/PURPOSE OF CONSULTATION:  Follow up breast cancer  Oncology History     Breast cancer of lower-outer quadrant of right female breast   Staging form: Breast, AJCC 7th Edition     Clinical stage from 02/19/2015: Stage IA (T1b, N0, M0) - Unsigned       Staging comments: Staged at breast conference on 3.23.16      Pathologic stage from 03/24/2015: Stage IA (T1c, N0, cM0) - Signed by Enid Cutter, MD on 03/31/2015       Staging comments: Staged on final lumpectomy specimen by Dr. Donato Heinz.      Breast cancer of lower-outer quadrant of right female breast ([place])   01/23/2015 Mammogram Possible mass in right breast warrants further evaluation.  No findings in the left breast suspicious for malignancy   02/03/2015 Breast US Right breast: 5 x 5 x 5 mm irregular hypoechoic mass with internal color vascularity at 6 o'clock position, 4 cm from the nipple   02/10/2015 Initial Biopsy Right needle core bx LOQ: Invasive ductal carcinoma, grade 1, ER+ (100%), PR+ (60%), HER2/neu negative (ratio 1.13), Ki67 3%; DCIS.     02/12/2015 Breast MRI Biopsy-proven malignancy in the central right breast at posterior depth. No MR findings to suggest multicentric or contralateral malignancy.   02/12/2015  Clinical Stage Stage IA: T1b N0   03/21/2015 Definitive Surgery Right breast lumpectomy / [facility] Dalbert Batman): Invasive ductal carcinoma, grade 1, 1.1 cm, ER+ (100%), PR+ (73%), HER-2 negative (ratio 1.3), Ki67 6%, negative margins / no lymphovascular invasion; DCIS.  One lymph node negative for tumor (0/1).     03/21/2015 Oncotype testing RS 7, low risk. (ROR 5% for 10-year recurrence with Tamoxifen alone).    03/21/2015 Pathologic Stage Stage IA: pT1c pN0   05/07/2015 - 06/23/2015 Radiation Therapy Adjuvant RT completed Pablo Ledger): Right breast 45 Gy over 25 fractions.  Right breast boost 16 Gy over 8 fractions. Total dose: 60 Gy.   06/02/2015 -  Anti-estrogen oral therapy Aromasin 25 mg once daily Burr Medico).  Planned duration of therapy: 5 years.   08/15/2015 Survivorship Survivorship visit completed and copy of survivorship care plan provided to patient.    CURRENT THERAPY: aromasin 35m daily, started on 06/02/2015   HISTORY OF PRESENTING ILLNESS:  Kirsten STERRY65y.o. female is here because of newly diagnosed breast cancer.  This was discovered by screening mammogtram. Her last screening mammogram in February 2015 was normal.  The screening mammogram on 01/23/2015 showed a possible mass in the right breast. She underwent diagnostic mammogram and ultrasound on 02/03/2015, which showed a 5 x 5 x 5 mm irregular mass in the right breast 6:00 position.  Your biopsy of the mass showed invasive ductal carcinoma and DCIS.   She has multiple medical problems. She has chronic arthritis, with diffuse join pain, 8/10, she takes tylenol. No chest pain, (+) dyspena on moderate exertion, no nausea,  abdominal bloating or change of her bowel habits. She is able to do most of her ADLs, limited light housework, not physically active, spends quite a bit of time sitting during the daytime.  She was hospitalized for UTI in 9/15 and went to rehab for a month. She has been using crane more regularly after that.   INTERIM  HISTORY: She returns for follow-up. She Is doing well overall. She is tolerating Aromasin well, no significant hot flash, she has chronic arthritis in her legs, she walks with a cane. Her pain is stable, and tolerable. She is moderately physically active, take walks every day, she lives with her daughter and granddaughter. Her blood pressure and blood glucose has been controlled lately, she states her beverages normal at home. No other new complaints   MEDICAL HISTORY:  Past Medical History  Diagnosis Date  . Hypertension   . Diabetes mellitus   . Depression   . Asthma   . Eczema   . Arthritis   . Cataract   . GERD (gastroesophageal reflux disease)   . History of kidney stones     w/ hx of hydronephrosis - followed by [facility]  . Anemia   . Obesity   . HIV nonspecific serology     2006: indeterminate HIV blood test, seen by ID, felt secondary to cross reacting antibodies with no further workup felt necessary at that time   . Fatty liver     Fatty infiltration of liver noted on 03/2012 CT scan  . CHF, acute ([place]) 05/03/2012  . Breast cancer of lower-outer quadrant of right female breast ([place]) 02/13/2015  . Radiation 05/07/15-06/23/15    Right Breast  . Fibromyalgia   . Heart disease    GYN HISTORY  Menarchal: 11 LMP: Several years ago Contraceptive: 1979-1986 HRT: NO G3P2:  SURGICAL HISTORY: Past Surgical History  Procedure Laterality Date  . Cystoscopy w/ litholapaxy / ehl    . Cholecystectomy  2003  . Replacement total knee bilateral  2005 &2006  . Joint replacement      bilateral knee replacement  . Vascular surgery Right 03/15/2013    Ultrasound guided sclerotherapy  . Left and right heart catheterization with coronary angiogram N/A 05/02/2012    Procedure: LEFT AND RIGHT HEART CATHETERIZATION WITH CORONARY ANGIOGRAM;  Surgeon: Burnell Blanks, MD;  Location: [facility] CATH LAB;  Service: Cardiovascular;  Laterality: N/A;  . Radioactive seed guided mastectomy with  axillary sentinel lymph node biopsy Right 03/21/2015    Procedure: RIGHT  PARTIAL MASTECTOMY WITH RADIOACTIVE SEED LOCALIZATION RIGHT  AXILLARY SENTINEL LYMPH NODE BIOPSY;  Surgeon: Fanny Skates, MD;  Location: [place];  Service: General;  Laterality: Right;    SOCIAL HISTORY: History   Social History  . Marital Status: Widowed     Spouse Name: [name]  . Number of Children: 3  . Years of Education: 14   Occupational History  . retired-CNA, housekeeping    Social History Main Topics  . Smoking status: Never Smoker   . Smokeless tobacco: Never Used  . Alcohol Use: No  . Drug Use: No  . Sexual Activity: No   Other Topics Concern  . Not on file   Social History Narrative   Health Care POA:    Emergency Contact: son, Mariann Palo [phone]   End of Life Plan:    Who lives with you:  husband Darrall Dears- [place] is his primary care giver   Any pets: [place]  Diet: Pt has a varied diet.  Is currently working on smaller portions sizes and no late night snacking for weight loss.   Exercise: Pt exercises 1x weekly with church group.   Seatbelts: Pt reports wearing seatbelt when in vehicles.    Hobbies: word searches, church, time with family and friends             FAMILY HISTORY: Family History  Problem Relation Age of Onset  . Diabetes Mother   . Stroke Mother   . Heart disease Father   . Anemia Father   . Cancer Sister 17    nose cancer   . Cancer Cousin 30    ovarian cancer   . Cancer Cousin 34    ovarian cancer     ALLERGIES:  has No Known Allergies.  MEDICATIONS:  Current Outpatient Prescriptions  Medication Sig Dispense Refill  . acetaminophen (TYLENOL 8 HOUR ARTHRITIS PAIN) 650 MG CR tablet Take 650 mg by mouth every 8 (eight) hours as needed for pain.    Marland Kitchen albuterol (PROVENTIL HFA;VENTOLIN HFA) 108 (90 Base) MCG/ACT inhaler Inhale 2 puffs into the lungs every 6 (six) hours as needed for wheezing. 1 Inhaler 3  . aspirin 81  MG tablet Take 81 mg by mouth daily.    Marland Kitchen atorvastatin (LIPITOR) 40 MG tablet Take 1 tablet (40 mg total) by mouth daily. 90 tablet 3  . benazepril (LOTENSIN) 20 MG tablet take 1 tablet by mouth once daily 90 tablet 3  . colchicine 0.6 MG tablet TAKE 1 TABLET BY MOUTH DAILY 90 tablet 0  . exemestane (AROMASIN) 25 MG tablet take 1 tablet by mouth once daily AFTER BREAKFAST 30 tablet 5  . fluticasone (FLOVENT HFA) 110 MCG/ACT inhaler Inhale 2 puffs into the lungs 2 (two) times daily. 12 g 6  . furosemide (LASIX) 40 MG tablet Take 1 tablet (20 mg) daily. 90 tablet 0  . gabapentin (NEURONTIN) 100 MG capsule 3-4 tabs three times a day 450 capsule 6  . meloxicam (MOBIC) 15 MG tablet Take 1 tablet (15 mg total) by mouth daily. 30 tablet 0  . metFORMIN (GLUCOPHAGE) 1000 MG tablet Take 1 tablet (1,000 mg total) by mouth 2 (two) times daily with a meal. 180 tablet 1  . metoprolol succinate (TOPROL-XL) 25 MG 24 hr tablet Take 0.5 tablets (12.5 mg total) by mouth daily. 45 tablet 3  . potassium citrate (UROCIT-K) 10 MEQ (1080 MG) SR tablet Take 1 tablet (10 mEq total) by mouth 2 (two) times daily. 60 tablet 6  . ranitidine (ZANTAC) 150 MG tablet TAKE 1 TABLET BY MOUTH AT BEDTIME 30 tablet 11  . spironolactone (ALDACTONE) 25 MG tablet   0   No current facility-administered medications for this visit.    REVIEW OF SYSTEMS:   Constitutional: Denies fevers, chills or abnormal night sweats Eyes: Denies blurriness of vision, double vision or watery eyes Ears, nose, mouth, throat, and face: Denies mucositis or sore throat Respiratory: Denies cough, dyspnea or wheezes Cardiovascular: Denies palpitation, chest discomfort or lower extremity swelling Gastrointestinal:  Denies nausea, heartburn or change in bowel habits Skin: Denies abnormal skin rashes Lymphatics: Denies new lymphadenopathy or easy bruising Neurological:Denies numbness, tingling or new weaknesses Behavioral/Psych: Mood is stable, no new changes   All other systems were reviewed with the patient and are negative.  PHYSICAL EXAMINATION: ECOG PERFORMANCE STATUS: 2 - Symptomatic, <50% confined to bed  Filed Vitals:   06/03/16 1033  BP: 165/87  Pulse: 80  Temp: 97.9  F (36.6 C)  Resp: 18   Filed Weights   06/03/16 1033  Weight: 220 lb (99.791 kg)    GENERAL:alert, no distress and comfortable SKIN: skin color, texture, turgor are normal, no rashes or significant lesions EYES: normal, conjunctiva are pink and non-injected, sclera clear OROPHARYNX:no exudate, no erythema and lips, buccal mucosa, and tongue normal  NECK: supple, thyroid normal size, non-tender, without nodularity LYMPH:  no palpable lymphadenopathy in the cervical, axillary or inguinal LUNGS: clear to auscultation and percussion with normal breathing effort HEART: regular rate & rhythm and no murmurs and no lower extremity edema ABDOMEN:abdomen soft, non-tender and normal bowel sounds Musculoskeletal:no cyanosis of digits and no clubbing  PSYCH: alert & oriented x 3 with fluent speech NEURO: no focal motor/sensory deficits Breasts: Breast inspection showed them to be symmetrical with no nipple discharge. Lumpectomy surgical scar in right breast is healing well, (+) breast deformity in the inferior right breast secondary to surgery. (+) Diffuse skin pigmentation and mild lymphedema of the entire right breast and right axilla. Surgical wounds are healed well and there are scar tissue under the incision. Palpation of bilateral breasts feels lumpy, but no discrete mass. Palpitation of axilla revealed no obvious mass that I could appreciate.   LABORATORY DATA:  I have reviewed the data as listed  CBC Latest Ref Rng 06/03/2016 02/03/2016 01/08/2016  WBC 3.9 - 10.3 10e3/uL 10.7(H) 7.6 6.9  Hemoglobin 11.6 - 15.9 g/dL 11.1(L) 10.4(L) 10.1(L)  Hematocrit 34.8 - 46.6 % 34.9 33.4(L) 32.1(L)  Platelets 145 - 400 10e3/uL 184 165 241   CMP Latest Ref Rng 06/03/2016 02/03/2016  01/19/2016  Glucose 70 - 140 mg/dl 105 137 56(L)  BUN 7.0 - 26.0 mg/dL 29.0(H) 18.0 52(H)  Creatinine 0.6 - 1.1 mg/dL 1.3(H) 1.5(H) 1.68(H)  Sodium 136 - 145 mEq/L 141 140 140  Potassium 3.5 - 5.1 mEq/L 5.3 Hemolyzed Sample(H) 4.6 4.8  Chloride 98 - 110 mmol/L - - 104  CO2 22 - 29 mEq/L _0 Calcium 8.4 - 10.4 mg/dL 9.7 9.1 9.1  Total Protein 6.4 - 8.3 g/dL 7.8 7.1 6.9  Total Bilirubin 0.20 - 1.20 mg/dL 0.38 0.65 0.9  Alkaline Phos 40 - 150 U/L 124 118 147(H)  AST 5 - 34 U/L 16 32 31  ALT 0 - 55 U/L 13 33 18      Pathology 03/21/2015 Diagnosis 1. Breast, lumpectomy, right - INVASIVE DUCTAL CARCINOMA, SEE COMMENT. - DUCTAL CARCINOMA IN SITU. - NEGATIVE FOR LYMPH VASCULAR INVASION. - PREVIOUS BIOPSY SITE IDENTIFIED. - SEE TUMOR SYNOPTIC TEMPLATE BELOW. 2. Lymph node, sentinel, biopsy, right axillary - ONE LYMPH NODE, NEGATIVE FOR TUMOR (0/1).  1. BREAST, INVASIVE TUMOR, WITH LYMPH NODES PRESENT Specimen, including laterality and lymph node sampling (sentinel, non-sentinel): Right breast with sentinel lymph node sampling. Procedure: Lumpectomy. Histologic type: Grade: I of III Tubule formation: 1 Nuclear pleomorphism: 2 Mitotic:1 Tumor size (gross measurement): 1.1 cm Margins: Invasive, distance to closest margin: 0.9 cm (medial). In-situ, distance to closest margin: Same as invasive. If margin positive, focally or broadly: N/A Lymphovascular invasion: Absent. Ductal carcinoma in situ: Present. Grade: I of III Extensive intraductal component: Absent. Lobular neoplasia: Absent. Tumor focality: Unifocal. Treatment effect: None. If present, treatment effect in breast tissue, lymph nodes or both: N/A Extent of tumor: Skin: N/A Nipple: N/A Skeletal muscle: N/A 2 of 4 FINAL for LABREA, E[place]LE[place]ON ([id number]) Microscopic Comment(continued) Lymph nodes: Examined: 1 Sentinel 0 Non-sentinel 1 Total Lymph nodes with metastasis: 0 Isolated tumor  cells (< 0.2 mm):  N/A Micrometastasis: (> 0.2 mm and < 2.0 mm): N/A Macrometastasis: (> 2.0 mm): N/A Extracapsular extension: N/A Breast prognostic profile: Estrogen receptor: Not repeated, previous study demonstrated 100% positivity ([id number]). Progesterone receptor: Not repeated, previous study demonstrated 60% positivity ([id number]). Her 2 neu: Repeated, previous study demonstrated no amplification (1.70) ([id number]). Ki-67: Not repeated, previous study demonstrated 3% proliferation rate ([id number]). Non-neoplastic breast: Previous biopsy site related tissue changes. TNM: pT1c, pN0, pMX  Results: IMMUNOHISTOCHEMICAL AND MORPHOMETRIC ANALYSIS BY THE AUTOMATED CELLULAR IMAGING SYSTEM (ACIS) Estrogen Receptor: 100%, POSITIVE, STRONG STAINING INTENSITY Progesterone Receptor: 73%, POSITIVE, STRONG STAINING INTENSITY Proliferation Marker Ki67: 6%  1. CHROMOGENIC IN-SITU HYBRIDIZATION Results: HER-2/NEU BY CISH - NEGATIVE. RESULT RATIO OF HER2: CEP 17 SIGNALS 1.30 AVERAGE HER2 COPY NUMBER PER CELL 1.95  Oncotype Dx: RS 7, predicts 5% 10-year risk of distant recurrence with tamoxifen alone, low risk   RADIOGRAPHIC STUDIES: I have personally reviewed the radiological images as listed and agreed with the findings in the report.  01/27/2016  Mammogram  IMPRESSION: No evidence of malignancy within either breast. Expected postsurgical changes in the right breast.  RECOMMENDATION: Bilateral diagnostic mammogram in 1 year. RECOMMENDATION: Diagnostic mammogram and possibly ultrasound of the right breast.  Bone density scan 09/03/2015 ASSESSMENT: The BMD measured at Forearm Radius 33% is 0.925 g/cm2 with a T-score of 0.4. This patient is considered NORMAL according to [facility]) criteria.Lumbar spine was not utilized due to advanced degenerative changes.  ASSESSMENT & PLAN:  65 year old postmenopausal African-American American female, screening detected right breast invasive  ductal carcinoma and DCIS.  1. pT1c N0 M0, stage IA right breast invasive ductal carcinoma, ER positive PR positive HER-2 negative, Ki 67 6%, and DCIS -I discussed her imaging findings and surgical pathology results with her and her daughters in great details. -She has early-stage breast cancer, high possibility of cured by the complete surgical resection. -Reviewed the possibility of local and distant recurrence after surgery, although it's low, we recommend adjuvant radiation and endocrine therapy to reduce her risk. -Her Oncotype was low risk, no need adjuvant chemotherapy -She has started Aromasin, tolerating well so far, we'll continue, plan for total 5 years. -He is clinically doing well. Her recent mammogram in February 2017 was normal -Her physical exam and lab are unremarkable, no evidence of recurrence. -Continue breast cancer surveillance with annual mammogram, self and physician breast exam, I encouraged her to have healthy diet and to be physically active  2. Bone health -I discussed the side effect of osteopenia and osteoporosis from Aromasin - I encouraged her to continue calcium and vitamin D supplement  -Her bone density scan in 08/2015 was normal. We will continue monitoring every 2 years   3. Hypertension, diabetes, arthritis, CHF -She will continue follow-up with her primary care physician -Her breast pressure is still a high in the office today, however she says her pressures normal at home. She'll continue monitoring  4. CKD stage III  -Her GFR is in 40s, stable overall, continue monitoring -To avoid nephrotoxin   Follow up: -Return to clinic in 4 months with lab  -Continue Aromasin, I refilled for her today   All questions were answered. The patient knows to call the clinic with any problems, questions or concerns. I spent 20 minutes counseling the patient face to face. The total time spent in the appointment was 25 minutes and more than 50% was on  counseling.     Truitt Merle, MD 06/03/2016 11:11 AM

## 2016-06-03 NOTE — Telephone Encounter (Signed)
per pfo to sch pt appt-gave pt copy of avs °

## 2016-06-08 ENCOUNTER — Ambulatory Visit (INDEPENDENT_AMBULATORY_CARE_PROVIDER_SITE_OTHER): Payer: Medicare Other | Admitting: Podiatry

## 2016-06-08 DIAGNOSIS — B351 Tinea unguium: Secondary | ICD-10-CM

## 2016-06-08 DIAGNOSIS — E1142 Type 2 diabetes mellitus with diabetic polyneuropathy: Secondary | ICD-10-CM

## 2016-06-08 DIAGNOSIS — M79676 Pain in unspecified toe(s): Secondary | ICD-10-CM | POA: Diagnosis not present

## 2016-06-08 NOTE — Progress Notes (Signed)
Patient ID: [name], female   DOB: 23-Jan-1951, 65 y.o.   MRN: [id number] Complaint:  Visit Type: Patient returns to my office for continued preventative foot care services. Complaint: Patient states" my nails have grown long and thick and become painful to walk and wear shoes" Patient has been diagnosed with DM with no complications. He presents for preventative foot care services. No changes to ROS  Podiatric Exam: Vascular: dorsalis pedis and posterior tibial pulses are palpable bilateral. Capillary return is immediate. Temperature gradient is WNL. Skin turgor WNL  Sensorium: Normal Semmes Weinstein monofilament test. Normal tactile sensation bilaterally. Nail Exam: Pt has thick disfigured discolored nails second and third toenails left foot. Ulcer Exam: There is no evidence of ulcer or pre-ulcerative changes or infection. Orthopedic Exam: Muscle tone and strength are WNL. No limitations in general ROM. No crepitus or effusions noted. Foot type and digits show no abnormalities. . Asymptomatic HAV B/L. Palpable pain at insertion plantar fascia B/L. Skin: No Porokeratosis. No infection or ulcers  Diagnosis:  Tinea unguium, , pain in left toes  Treatment & Plan Procedures and Treatment: Consent by patient was obtained for treatment procedures. The patient understood the discussion of treatment and procedures well. All questions were answered thoroughly reviewed. Debridement of mycotic and hypertrophic toenails, 1 through 5 bilateral and clearing of subungual debris. No ulceration, no infection noted.  Prescribed Mobic for heel pain. Return Visit-Office Procedure: Patient instructed to return to the office for a follow up visit 3 months for continued evaluation and treatment.   Gardiner Barefoot DPM

## 2016-06-15 ENCOUNTER — Other Ambulatory Visit: Payer: Self-pay | Admitting: *Deleted

## 2016-06-15 MED ORDER — METOPROLOL SUCCINATE ER 25 MG PO TB24: 12.5000 mg | ORAL_TABLET | Freq: Every day | ORAL | Status: DC

## 2016-06-21 ENCOUNTER — Other Ambulatory Visit: Payer: Self-pay | Admitting: *Deleted

## 2016-06-21 MED ORDER — SPIRONOLACTONE 25 MG PO TABS: 25.0000 mg | ORAL_TABLET | Freq: Every day | ORAL | 1 refills | Status: DC

## 2016-06-29 NOTE — Patient Instructions (Addendum)
Good to see you today!  Thanks for coming in.  Please see you eye doctor and ask them to send [place] a report  Good to see you today!  Thanks for coming in.  Your blood pressure is going well  Come back in November for a diabetes check  Have Fun

## 2016-06-29 NOTE — Progress Notes (Signed)
Subjective  Patient is presenting with the following illnesses  HYPERTENSION Disease Monitoring: Blood pressure range-all from home are less 140/90 Chest pain, palpitations- no      Dyspnea- no Medications: Compliance- brought in meds Lightheadedness,Syncope- no   Edema- no  DIABETES Disease Monitoring: Blood Sugar ranges-not checking Polyuria/phagia/dipsia- no      Visual problems- no Medications: Compliance- metformin twice a day  Hypoglycemic symptoms- no  HYPERLIPIDEMIA Disease Monitoring: See symptoms for Hypertension Medications: Compliance- lipitor 40 mg last ld 36  Right upper quadrant pain- no  Muscle aches- no  Monitoring Labs and Parameters Last A1C:  Lab Results  Component Value Date   HGBA1C 5.7 03/31/2016    Last Lipid:     Component Value Date/Time   CHOL 229 (H) 01/07/2016 1635   HDL 39 (L) 01/07/2016 1635   LDLDIRECT 36 03/31/2016 1027    Last Bmet  Potassium  Date Value Ref Range Status  06/03/2016 5.3 Hemolyzed Sample (H) 3.5 - 5.1 mEq/L Final   Sodium  Date Value Ref Range Status  06/03/2016 141 136 - 145 mEq/L Final   Creatinine  Date Value Ref Range Status  06/03/2016 1.3 (H) 0.6 - 1.1 mg/dL Final      Last BPs:  BP Readings from Last 3 Encounters:  06/30/16 132/85  06/03/16 (!) 165/87  05/14/16 123/69    Chief Complaint noted Review of Symptoms - see HPI PMH - Smoking status noted.   Vital Signs reviewed   Objective Vital Signs reviewed Diabetic Foot Check -  Appearance - no lesions, ulcers or calluses Skin - no unusual pallor or redness Monofilament testing -  Right - Great toe, medial, central, lateral ball and posterior foot intact Left - Great toe, medial, central, lateral ball and posterior foot intact   Assessments/Plans  Asthma, intermittent Stable  Rarely uses albuterol  Essential hypertension BP Readings from Last 3 Encounters:  06/30/16 132/85  06/03/16 (!) 165/87  05/14/16 123/69   Stable well  controlled by home and office reasdings  Hyperlipidemia LDL as well below goal.   Continue current medications.   Triglycerides are high but not to pancreatitis levels and no evidence other treatments would be benefical   Type 2 diabetes mellitus with diabetic foot deformity Asymptomatic and A1c at goal    See Encounter view if individual problem A/Ps not visible See after visit summary for details of patient instuctions

## 2016-06-30 ENCOUNTER — Other Ambulatory Visit: Payer: Self-pay | Admitting: Family Medicine

## 2016-06-30 ENCOUNTER — Encounter: Payer: Self-pay | Admitting: Family Medicine

## 2016-06-30 ENCOUNTER — Ambulatory Visit (INDEPENDENT_AMBULATORY_CARE_PROVIDER_SITE_OTHER): Payer: Medicare Other | Admitting: Family Medicine

## 2016-06-30 DIAGNOSIS — M21969 Unspecified acquired deformity of unspecified lower leg: Secondary | ICD-10-CM

## 2016-06-30 DIAGNOSIS — E785 Hyperlipidemia, unspecified: Secondary | ICD-10-CM

## 2016-06-30 DIAGNOSIS — E1169 Type 2 diabetes mellitus with other specified complication: Secondary | ICD-10-CM | POA: Diagnosis not present

## 2016-06-30 DIAGNOSIS — I10 Essential (primary) hypertension: Secondary | ICD-10-CM

## 2016-06-30 DIAGNOSIS — J4520 Mild intermittent asthma, uncomplicated: Secondary | ICD-10-CM | POA: Diagnosis not present

## 2016-06-30 NOTE — Assessment & Plan Note (Signed)
Asymptomatic and A1c at goal

## 2016-06-30 NOTE — Assessment & Plan Note (Signed)
Stable  Rarely uses albuterol

## 2016-06-30 NOTE — Assessment & Plan Note (Signed)
LDL as well below goal.   Continue current medications.   Triglycerides are high but not to pancreatitis levels and no evidence other treatments would be benefical

## 2016-06-30 NOTE — Assessment & Plan Note (Signed)
BP Readings from Last 3 Encounters:  06/30/16 132/85  06/03/16 (!) 165/87  05/14/16 123/69   Stable well controlled by home and office reasdings

## 2016-07-05 ENCOUNTER — Other Ambulatory Visit: Payer: Self-pay | Admitting: Pharmacist

## 2016-07-05 NOTE — Patient Outreach (Signed)
Outreach call to [name] regarding her request for follow up from the [facility] Medication Adherence Campaign. Called and spoke with patient. HIPAA identifiers verified and verbal consent received.  [name] reports that she is currently taking her atorvastatin 40 mg daily. Reports that a couple of month ago she had been off of the medication for about a month. However, reports that she was instructed to restart it by Cardiology. Denies any issues with taking or obtaining her medications. Denies having any medication question or concerns. Provide patient with my phone number for future questions.   Harlow Asa, PharmD Clinical Pharmacist [facility] [phone]

## 2016-07-15 ENCOUNTER — Other Ambulatory Visit: Payer: Self-pay | Admitting: Family Medicine

## 2016-07-23 ENCOUNTER — Other Ambulatory Visit: Payer: Self-pay | Admitting: Family Medicine

## 2016-08-03 ENCOUNTER — Encounter: Payer: Self-pay | Admitting: Neurology

## 2016-08-03 ENCOUNTER — Ambulatory Visit (INDEPENDENT_AMBULATORY_CARE_PROVIDER_SITE_OTHER): Payer: Medicare Other | Admitting: Neurology

## 2016-08-03 VITALS — BP 110/68 | HR 72 | Resp 18 | Ht 62.0 in | Wt 220.0 lb

## 2016-08-03 DIAGNOSIS — R258 Other abnormal involuntary movements: Secondary | ICD-10-CM

## 2016-08-03 DIAGNOSIS — R251 Tremor, unspecified: Secondary | ICD-10-CM

## 2016-08-03 NOTE — Patient Instructions (Addendum)
Try to stay active, use your cane for safety, change positions slowly, turn slowly and stay well hydrated with water, try to lose weight.  Exam is stable, to slightly better today.  I will see you back as needed.

## 2016-08-03 NOTE — Progress Notes (Signed)
Subjective:    Patient ID: [name] is a 65 y.o. female.  HPI     Interim history:   [name] is a 65 year old right-handed woman with an underlying complex medical history of type 2 diabetes, morbid obesity, chronic systolic CHF, gout, vitamin D deficiency, hyperlipidemia, back pain, arthritis, chronic pain, bilateral knee replacement surgeries, hypertension, depression, kidney stones, degenerative disc disease and asthma, who presents for follow-up consultation of her intermittent tremors. The patient is unaccompanied today. I first met her on 01/28/2016 after her recent hospitalization. I did not detect any telltale signs of parkinsonism at the time and no sinister tremor, tremor was mild at the time and we mutually agreed to monitor her tremor. I did not suggest any new medications at the time.  Today, 08/03/2016: She reports that the tremor is about the same. More on the right, comes and goes, has some pain in hand joints, sometimes the middle finger on the R does not straighten up easily. Lives with daughter and GD. Balance off at times, walks with a cane, no falls, has pain in lower back and b/l hip pain and knee pain. R side is worse as far as joint.   Previously:  01/28/2016: I saw [name] as a referral for tremors from the hospital after her recent hospitalization. The patient is unaccompanied today. [name] is a 65 year old right-handed woman with an underlying complex medical history of type 2 diabetes, morbid obesity, chronic systolic CHF, gout, vitamin D deficiency, hyperlipidemia, back pain, arthritis, chronic pain, bilateral knee replacement surgeries, hypertension, depression, kidney stones, degenerative disc disease and asthma, who was recently in the hospital for chest pain. I reviewed the hospital records. I reviewed the discharge summary as well. She was admitted on 01/07/2016 and discharged on 01/09/2016. During the hospital stay she was found to have  tremors. Upon admission the patient complained of chest pressure, shortness of breath and was  found to have stuttering. She was found to be hyperglycemic upon admission. Workup for acute coronary was negative, echocardiogram showed grade 1 diastolic dysfunction and EF of 30-35%. Cardiology was consulted. She was diuresed with Lasix. Initial EKG and repeat EKG were fairly unremarkable. She was found to have hyperlipidemia, she was found to have an elevated A1c of 10.8.    She had a brain MRI without contrast secondary to stuttering noted.    She had a brain MRI without contrast on 01/07/2016: IMPRESSION: 1. No acute intracranial abnormality. 2. Mild chronic small vessel ischemic disease, unchanged. In addition, I personally reviewed the images through the PACS system.   She reports tremors in her hands for the past 2 or so years. Symptoms have been mildly progressive. She noticed worsening when she went to the emergency room on 01/07/2016. Sometimes she has difficulty using her right hand in terms of tremor exacerbation. Generally symptoms are intermittent, sometimes on the left, sometimes on the right. She walks with a cane. She has hip pain, neck pain and back pain. She sees orthopedics. She has been on MSIR 15 mg 3 times a day. She is on a fairly high-dose of gabapentin, 500 mg in the morning and evening and 400 mg in midday.   There is no family history of tremors or Parkinson's   Her Past Medical History Is Significant For: Past Medical History:  Diagnosis Date  . Anemia   . Arthritis   . Asthma   . Breast cancer of lower-outer quadrant of right female breast ([place]) 02/13/2015  .  Cataract   . CHF, acute ([place]) 05/03/2012  . Depression   . Diabetes mellitus   . Eczema   . Fatty liver    Fatty infiltration of liver noted on 03/2012 CT scan  . Fibromyalgia   . GERD (gastroesophageal reflux disease)   . Heart disease   . History of kidney stones    w/ hx of hydronephrosis - followed by  [facility]  . HIV nonspecific serology    2006: indeterminate HIV blood test, seen by ID, felt secondary to cross reacting antibodies with no further workup felt necessary at that time   . Hypertension   . Obesity   . Radiation 05/07/15-06/23/15   Right Breast    Her Past Surgical History Is Significant For: Past Surgical History:  Procedure Laterality Date  . CHOLECYSTECTOMY  2003  . CYSTOSCOPY W/ LITHOLAPAXY / [facility]    . JOINT REPLACEMENT     bilateral knee replacement  . LEFT AND RIGHT HEART CATHETERIZATION WITH CORONARY ANGIOGRAM N/A 05/02/2012   Procedure: LEFT AND RIGHT HEART CATHETERIZATION WITH CORONARY ANGIOGRAM;  Surgeon: Burnell Blanks, MD;  Location: [facility] CATH LAB;  Service: Cardiovascular;  Laterality: N/A;  . RADIOACTIVE SEED GUIDED MASTECTOMY WITH AXILLARY SENTINEL LYMPH NODE BIOPSY Right 03/21/2015   Procedure: RIGHT  PARTIAL MASTECTOMY WITH RADIOACTIVE SEED LOCALIZATION RIGHT  AXILLARY SENTINEL LYMPH NODE BIOPSY;  Surgeon: Fanny Skates, MD;  Location: [facility];  Service: General;  Laterality: Right;  . REPLACEMENT TOTAL KNEE BILATERAL  2005 &2006  . VASCULAR SURGERY Right 03/15/2013   Ultrasound guided sclerotherapy    Her Family History Is Significant For: Family History  Problem Relation Age of Onset  . Diabetes Mother   . Stroke Mother   . Heart disease Father   . Anemia Father   . Cancer Sister 17    nose cancer   . Cancer Cousin 30    ovarian cancer   . Cancer Cousin 42    ovarian cancer     Her Social History Is Significant For: Social History   Social History  . Marital status: Widowed    Spouse name: [name]  . Number of children: 3  . Years of education: 12   Occupational History  . retired-CNA, housekeeping Retired   Social History Main Topics  . Smoking status: Never Smoker  . Smokeless tobacco: Never Used  . Alcohol use No  . Drug use: No  . Sexual activity: No   Other Topics Concern  . None   Social  History Narrative   Health Care POA:    Emergency Contact: son, [name] [phone]   End of Life Plan:    Who lives with you:  [name] and granddaughter- [name]   Any pets: dog   Diet: Pt has a varied diet.  Reports eating 2 meals a day.    Exercise: walks daily 15-20 mins   Seatbelts: Pt reports wearing seatbelt when in vehicles.    Hobbies: word searches, church, time with family and friends, cooking, walking   Denies caffeine use              Her Allergies Are:  No Known Allergies:   Her Current Medications Are:  Outpatient Encounter Prescriptions as of 08/03/2016  Medication Sig  . acetaminophen (TYLENOL 8 HOUR ARTHRITIS PAIN) 650 MG CR tablet Take 650 mg by mouth every 8 (eight) hours as needed for pain.  Marland Kitchen albuterol (PROVENTIL HFA;VENTOLIN HFA) 108 (90 Base) MCG/ACT inhaler Inhale  2 puffs into the lungs every 6 (six) hours as needed for wheezing.  Marland Kitchen aspirin 81 MG tablet Take 81 mg by mouth daily.  Marland Kitchen atorvastatin (LIPITOR) 40 MG tablet Take 1 tablet (40 mg total) by mouth daily.  . benazepril (LOTENSIN) 20 MG tablet take 1 tablet by mouth once daily  . colchicine 0.6 MG tablet take 1 tablet by mouth once daily  . exemestane (AROMASIN) 25 MG tablet take 1 tablet by mouth once daily AFTER BREAKFAST  . fluticasone (FLOVENT HFA) 110 MCG/ACT inhaler Inhale 2 puffs into the lungs 2 (two) times daily.  . furosemide (LASIX) 40 MG tablet Take 1 tablet (20 mg) daily.  Marland Kitchen gabapentin (NEURONTIN) 100 MG capsule TAKE 4 TO 5 CAPSULES BY MOUTH 3 TIMES A DAY  . meloxicam (MOBIC) 15 MG tablet Take 1 tablet (15 mg total) by mouth daily.  . metFORMIN (GLUCOPHAGE) 1000 MG tablet take 1 tablet by mouth twice a day with meals  . metoprolol succinate (TOPROL-XL) 25 MG 24 hr tablet Take 0.5 tablets (12.5 mg total) by mouth daily.  . potassium citrate (UROCIT-K) 10 MEQ (1080 MG) SR tablet Take 1 tablet (10 mEq total) by mouth 2 (two) times daily.  . ranitidine (ZANTAC) 150 MG tablet  TAKE 1 TABLET BY MOUTH AT BEDTIME  . spironolactone (ALDACTONE) 25 MG tablet Take 1 tablet (25 mg total) by mouth daily.   No facility-administered encounter medications on file as of 08/03/2016.   :  Review of Systems:  Out of a complete 14 point review of systems, all are reviewed and negative with the exception of these symptoms as listed below:      Review of Systems  Neurological:       Patient reports that she is doing the same as last visit. Has trouble with L hand "falling asleep", and has trouble opening her R hang after making a fist.     Objective:  Neurologic Exam  Physical Exam Physical Examination:   Vitals:   08/03/16 1044  BP: 110/68  Pulse: 72  Resp: 18   General Examination: The patient is a very pleasant 65 y.o. female in no acute distress. She appears well-developed and well-nourished and adequately groomed.  HEENT: Normocephalic, atraumatic, pupils are equal, round and reactive to light and accommodation. Mild b/l cataracts noted. Extraocular tracking is good without limitation to gaze excursion or nystagmus noted. Normal smooth pursuit is noted. Hearing is grossly intact. Face is symmetric with normal facial animation and normal facial sensation. Speech is clear with no dysarthria noted. There is no hypophonia. There is no lip, neck/head, jaw or voice tremor. Neck is supple with full range of passive and active motion. There are no carotid bruits on auscultation. Oropharynx exam reveals: mild mouth dryness, adequate dental hygiene and moderate airway crowding.   Chest: Clear to auscultation without wheezing, rhonchi or crackles noted.  Heart: S1+S2+0, regular and normal without murmurs, rubs or gallops noted.   Abdomen: Soft, non-tender and non-distended with normal bowel sounds appreciated on auscultation.  Extremities: There is 1 to 2+ pitting edema in the distal lower extremities bilaterally, right more left. Wear compression stockings b/l up to the  knees.   Skin: Warm and dry without trophic changes noted. There are no varicose veins.  Musculoskeletal: exam reveals no obvious joint deformities, tenderness or joint swelling or erythema.   Neurologically:  Mental status: The patient is awake, alert and oriented in all 4 spheres. Her immediate and remote memory, attention, language skills  and fund of knowledge are fairly appropriate. There is no evidence of aphasia, agnosia, apraxia or anomia. Speech is clear with normal prosody and enunciation, but she answers in one or 2 word sentences only. Mood appears to be constricted and affect is blunted.  Cranial nerves II - XII are as described above under HEENT exam. In addition: shoulder shrug is normal with equal shoulder height noted. Motor exam: Normal bulk, strength and tone is noted. There is no drift, or rebound. There is no resting tremor. There is an intermittent right and left hand tremor with posture, little with action, no intention tremor. The tremor frequency and amplitude fluctuates. (On 01/28/16: On Archimedes spiral drawing there is mild tremulousness noted. Handwriting is minimally tremulous and very legible, not particularly micrographic).  Romberg is negative. Relexes are 1+ in the upper extremities, trace in both knees, absent in both ankles. Fine motor skills are mildly impaired bilaterally but no lateralization is noted. There is no past pointing, heel-to-shin is not possible for her. Sensory exam intact in the UEs and LEs.  Gait, station and balance: She stands up with mild difficulty. She reports low back pain and hip pain bilaterally. She does not need a cane while standing. She can walk some without the cane, mild difficulty turning, posture seems age-appropriate, slightly more bent in the lower back. Tandem walk is not possible for her.   Assessment and Plan:   In summary, BRAYLYN EYE is a 65 year old female with an underlying complex medical history of type 2 diabetes,  morbid obesity, chronic systolic CHF, gout, vitamin D deficiency, hyperlipidemia, back pain, hypertension, depression, kidney stones, degenerative disc disease and asthma, who presents for follow-up consultation of her intermittent upper extremity tremors, on examination she has remained stable, perhaps slight degree better today on neurological exam overall, and she is reassured in that regard. For her balance issues, she is advised to change positions slowly, turns slowly, use her cane for safety, stay well-hydrated with water. She is also encouraged to try to lose weight. No telltale signs of parkinsonism, no history suggesting essential tremor and exam is mild, stable at this time.  We reviewed her brain MRI results from February 2017 again today. Findings were nonfocal at the time. At this juncture, I suggested  as needed for follow-up. I answered all her questions today and she was in agreement.  I spent 25 minutes in total face-to-face time with the patient, more than 50% of which was spent in counseling and coordination of care, reviewing test results, reviewing medication and discussing or reviewing the diagnosis of tremor, the prognosis and treatment options.

## 2016-08-17 ENCOUNTER — Telehealth: Payer: Self-pay | Admitting: *Deleted

## 2016-08-17 NOTE — Telephone Encounter (Signed)
"  I need to reschedule the appointment with [name] in November.  Call transferred ext [phone].

## 2016-08-26 ENCOUNTER — Other Ambulatory Visit: Payer: Self-pay | Admitting: Family Medicine

## 2016-09-07 ENCOUNTER — Encounter: Payer: Self-pay | Admitting: Podiatry

## 2016-09-07 ENCOUNTER — Ambulatory Visit (INDEPENDENT_AMBULATORY_CARE_PROVIDER_SITE_OTHER): Payer: Medicare Other | Admitting: Podiatry

## 2016-09-07 ENCOUNTER — Telehealth: Payer: Self-pay | Admitting: Family Medicine

## 2016-09-07 VITALS — BP 112/62 | HR 95 | Resp 14

## 2016-09-07 DIAGNOSIS — B351 Tinea unguium: Secondary | ICD-10-CM | POA: Diagnosis not present

## 2016-09-07 DIAGNOSIS — M79676 Pain in unspecified toe(s): Secondary | ICD-10-CM | POA: Diagnosis not present

## 2016-09-07 DIAGNOSIS — M129 Arthropathy, unspecified: Secondary | ICD-10-CM

## 2016-09-07 DIAGNOSIS — E1142 Type 2 diabetes mellitus with diabetic polyneuropathy: Secondary | ICD-10-CM

## 2016-09-07 DIAGNOSIS — M2010 Hallux valgus (acquired), unspecified foot: Secondary | ICD-10-CM

## 2016-09-07 NOTE — Telephone Encounter (Signed)
Called [phone]    NA  Left VM - if red swollen and fever should be seen tonight If not take her usual pain medications If desired I can see her tomorrow worked in to my AM clinic

## 2016-09-07 NOTE — Progress Notes (Signed)
Patient ID: [name], female   DOB: November 30, 1950, 65 y.o.   MRN: [id number] Complaint:  Visit Type: Patient returns to my office for continued preventative foot care services. Complaint: Patient states" my nails have grown long and thick and become painful to walk and wear shoes" Patient has been diagnosed with DM with no complications. He presents for preventative foot care services. No changes to ROS  Podiatric Exam: Vascular: dorsalis pedis and posterior tibial pulses are palpable bilateral. Capillary return is immediate. Temperature gradient is WNL. Skin turgor WNL  Sensorium: Normal Semmes Weinstein monofilament test. Normal tactile sensation bilaterally. Nail Exam: Pt has thick disfigured discolored nails second and third toenails left foot. Ulcer Exam: There is no evidence of ulcer or pre-ulcerative changes or infection. Orthopedic Exam: Muscle tone and strength are WNL. No limitations in general ROM. No crepitus or effusions noted. Foot type and digits show no abnormalities. . Asymptomatic HAV B/L. Palpable pain at insertion plantar fascia B/L. Skin: No Porokeratosis. No infection or ulcers  Diagnosis:  Tinea unguium, , pain in left toes  Treatment & Plan Procedures and Treatment: Consent by patient was obtained for treatment procedures. The patient understood the discussion of treatment and procedures well. All questions were answered thoroughly reviewed. Debridement of mycotic and hypertrophic toenails, 1 through 5 bilateral and clearing of subungual debris. No ulceration, no infection noted.  Initiate diabetic shoe paperwork. Return Visit-Office Procedure: Patient instructed to return to the office for a follow up visit 3 months for continued evaluation and treatment.   Gardiner Barefoot DPM

## 2016-09-07 NOTE — Telephone Encounter (Signed)
Left hand is swollen and a lot of pain.  She wants to know what she can do without going to the ED.

## 2016-09-08 ENCOUNTER — Ambulatory Visit (INDEPENDENT_AMBULATORY_CARE_PROVIDER_SITE_OTHER): Payer: Medicare Other | Admitting: Family Medicine

## 2016-09-08 ENCOUNTER — Encounter: Payer: Self-pay | Admitting: Family Medicine

## 2016-09-08 VITALS — BP 112/72 | HR 70 | Temp 97.7°F

## 2016-09-08 DIAGNOSIS — R739 Hyperglycemia, unspecified: Secondary | ICD-10-CM

## 2016-09-08 DIAGNOSIS — Z23 Encounter for immunization: Secondary | ICD-10-CM | POA: Diagnosis not present

## 2016-09-08 DIAGNOSIS — E1169 Type 2 diabetes mellitus with other specified complication: Secondary | ICD-10-CM

## 2016-09-08 DIAGNOSIS — R251 Tremor, unspecified: Secondary | ICD-10-CM

## 2016-09-08 DIAGNOSIS — M21969 Unspecified acquired deformity of unspecified lower leg: Secondary | ICD-10-CM

## 2016-09-08 DIAGNOSIS — M79642 Pain in left hand: Secondary | ICD-10-CM

## 2016-09-08 LAB — GLUCOSE, CAPILLARY: Glucose-Capillary: 122 mg/dL — ABNORMAL HIGH (ref 65–99)

## 2016-09-08 MED ORDER — TRAMADOL HCL 50 MG PO TABS: 50.0000 mg | ORAL_TABLET | Freq: Three times a day (TID) | ORAL | 1 refills | Status: DC | PRN

## 2016-09-08 NOTE — Progress Notes (Signed)
Subjective  Patient is presenting with the following illnesses  Left Arm Hand Pain For last few days.  Actually a little better today.  Pain is with movment of shoulder elbow and wrist.  Soft tissue swelling of hand.  No trauma.  No fever or redness.  Did run out of her colchicine for several weeks.  Restarted 4-5 days ago  Shaking Stuttering Having episodes of this intermittently.  Happening a lot this AM.  Seen by Neuro who did not have a diagnosis and suggested keeping active.   No LOC or focal weakness or confusion.  No change in medications or new ones.  No visual changes   Chief Complaint noted Review of Symptoms - see HPI PMH - Smoking status noted.     Objective Vital Signs reviewed When first seen was having gross tremors of R hand and arm coarse shaking and stuttering with fluttering of eyelids.  Could follow requests and was oriented with normal strength in hands At the end of the visit the tremor and stuttering had resolved Left hand with diffuse nontender edema.  No redness moderate pain with grip.  FROM at elbow and wrist decreased range of motion of left shoulder due to pain Left grip is normal At end of visit could rise and walk and turn on her own using a cane    Assessments/Plans  Hand Arm Pain  Edema seems consistent with being depenent with perhaps resolving gout flare since she resumed her colchicine.  No signs of infectin.   Shoulder pain is consistent with flare of DJD Treat with her usual analgesics and add tramadol as needed   No problem-specific Assessment & Plan notes found for this encounter.   See Encounter view if individual problem A/Ps not visible See after visit summary for details of patient instuctions

## 2016-09-08 NOTE — Patient Instructions (Addendum)
Good to see you today!  Thanks for coming in.  Keep taking all medications including the colchicine once day  Use Tramadol when have severe pain.  It can increase you risks of falls be careful  Try to walk as much as you can to keep the muscle strong  Come back in December

## 2016-09-08 NOTE — Assessment & Plan Note (Signed)
Worsened episode to day associated with shaking of R hand.  Normal blood sugar.  Unsure of cause.  See recent Neuro note.  Subsided by the end of visit.  Wll monitor

## 2016-09-27 ENCOUNTER — Other Ambulatory Visit: Payer: Self-pay | Admitting: Family Medicine

## 2016-09-27 DIAGNOSIS — E785 Hyperlipidemia, unspecified: Secondary | ICD-10-CM

## 2016-10-04 ENCOUNTER — Ambulatory Visit: Payer: Medicare Other | Admitting: Hematology

## 2016-10-04 ENCOUNTER — Other Ambulatory Visit: Payer: Medicare Other

## 2016-10-04 DIAGNOSIS — N3020 Other chronic cystitis without hematuria: Secondary | ICD-10-CM | POA: Diagnosis not present

## 2016-10-04 DIAGNOSIS — N200 Calculus of kidney: Secondary | ICD-10-CM | POA: Diagnosis not present

## 2016-10-05 ENCOUNTER — Ambulatory Visit (HOSPITAL_BASED_OUTPATIENT_CLINIC_OR_DEPARTMENT_OTHER): Payer: Medicare Other | Admitting: Hematology

## 2016-10-05 ENCOUNTER — Encounter: Payer: Self-pay | Admitting: Hematology

## 2016-10-05 ENCOUNTER — Other Ambulatory Visit (HOSPITAL_BASED_OUTPATIENT_CLINIC_OR_DEPARTMENT_OTHER): Payer: Medicare Other

## 2016-10-05 ENCOUNTER — Telehealth: Payer: Self-pay | Admitting: Hematology

## 2016-10-05 VITALS — BP 149/97 | HR 83 | Temp 97.7°F | Resp 18 | Ht 62.0 in | Wt 222.9 lb

## 2016-10-05 DIAGNOSIS — Z170 Estrogen receptor positive status [ER+]: Secondary | ICD-10-CM

## 2016-10-05 DIAGNOSIS — I10 Essential (primary) hypertension: Secondary | ICD-10-CM

## 2016-10-05 DIAGNOSIS — E119 Type 2 diabetes mellitus without complications: Secondary | ICD-10-CM

## 2016-10-05 DIAGNOSIS — C50511 Malignant neoplasm of lower-outer quadrant of right female breast: Secondary | ICD-10-CM

## 2016-10-05 DIAGNOSIS — N183 Chronic kidney disease, stage 3 (moderate): Secondary | ICD-10-CM | POA: Diagnosis not present

## 2016-10-05 DIAGNOSIS — I5042 Chronic combined systolic (congestive) and diastolic (congestive) heart failure: Secondary | ICD-10-CM

## 2016-10-05 DIAGNOSIS — M1990 Unspecified osteoarthritis, unspecified site: Secondary | ICD-10-CM

## 2016-10-05 DIAGNOSIS — D649 Anemia, unspecified: Secondary | ICD-10-CM | POA: Diagnosis not present

## 2016-10-05 DIAGNOSIS — I509 Heart failure, unspecified: Secondary | ICD-10-CM

## 2016-10-05 LAB — CBC WITH DIFFERENTIAL/PLATELET
BASO%: 0.4 % (ref 0.0–2.0)
Basophils Absolute: 0 10*3/uL (ref 0.0–0.1)
EOS ABS: 0.2 10*3/uL (ref 0.0–0.5)
EOS%: 2.9 % (ref 0.0–7.0)
HCT: 35.7 % (ref 34.8–46.6)
HEMOGLOBIN: 11.2 g/dL — AB (ref 11.6–15.9)
LYMPH#: 2.9 10*3/uL (ref 0.9–3.3)
LYMPH%: 36 % (ref 14.0–49.7)
MCH: 30 pg (ref 25.1–34.0)
MCHC: 31.4 g/dL — ABNORMAL LOW (ref 31.5–36.0)
MCV: 95.7 fL (ref 79.5–101.0)
MONO#: 0.7 10*3/uL (ref 0.1–0.9)
MONO%: 8.2 % (ref 0.0–14.0)
NEUT%: 52.5 % (ref 38.4–76.8)
NEUTROS ABS: 4.2 10*3/uL (ref 1.5–6.5)
PLATELETS: 172 10*3/uL (ref 145–400)
RBC: 3.73 10*6/uL (ref 3.70–5.45)
RDW: 13.1 % (ref 11.2–14.5)
WBC: 8 10*3/uL (ref 3.9–10.3)

## 2016-10-05 LAB — COMPREHENSIVE METABOLIC PANEL
ALBUMIN: 3.7 g/dL (ref 3.5–5.0)
ALT: 12 U/L (ref 0–55)
ANION GAP: 11 meq/L (ref 3–11)
AST: 14 U/L (ref 5–34)
Alkaline Phosphatase: 120 U/L (ref 40–150)
BUN: 28.2 mg/dL — AB (ref 7.0–26.0)
CO2: 27 mEq/L (ref 22–29)
Calcium: 9.5 mg/dL (ref 8.4–10.4)
Chloride: 104 mEq/L (ref 98–109)
Creatinine: 1.5 mg/dL — ABNORMAL HIGH (ref 0.6–1.1)
EGFR: 43 mL/min/{1.73_m2} — ABNORMAL LOW (ref >90)
Glucose: 133 mg/dl (ref 70–140)
Potassium: 5.2 mEq/L — ABNORMAL HIGH (ref 3.5–5.1)
Sodium: 142 mEq/L (ref 136–145)
TOTAL PROTEIN: 7.5 g/dL (ref 6.4–8.3)
Total Bilirubin: 0.4 mg/dL (ref 0.20–1.20)

## 2016-10-05 NOTE — Progress Notes (Signed)
[facility]  [phone] [phone]  Clinic follow up Note   Patient Care Team: Lind Covert, MD as PCP - General (Family Medicine) Gevena Cotton, MD (Ophthalmology) Rana Snare, MD (Urology) Dr. Edwin Cap ([place]) Fanny Skates, MD as Consulting Physician (General Surgery) Thea Silversmith, MD as Consulting Physician (Radiation Oncology) Mauro Kaufmann, RN as Registered Nurse Rockwell Germany, RN as Registered Nurse Holley Bouche, NP as Nurse Practitioner (Nurse Practitioner) Truitt Merle, MD as Consulting Physician (Hematology) Sylvan Cheese, NP as Nurse Practitioner (Nurse Practitioner) Gardiner Barefoot, DPM (Podiatry) 10/05/2016  CHIEF COMPLAINTS/PURPOSE OF CONSULTATION:  Follow up breast cancer  Oncology History     Breast cancer of lower-outer quadrant of right female breast   Staging form: Breast, AJCC 7th Edition     Clinical stage from 02/19/2015: Stage IA (T1b, N0, M0) - Unsigned       Staging comments: Staged at breast conference on 3.23.16      Pathologic stage from 03/24/2015: Stage IA (T1c, N0, cM0) - Signed by Enid Cutter, MD on 03/31/2015       Staging comments: Staged on final lumpectomy specimen by Dr. Donato Heinz.      Breast cancer of lower-outer quadrant of right female breast ([place])   01/23/2015 Mammogram    Possible mass in right breast warrants further evaluation.  No findings in the left breast suspicious for malignancy      02/03/2015 Breast US    Right breast: 5 x 5 x 5 mm irregular hypoechoic mass with internal color vascularity at 6 o'clock position, 4 cm from the nipple      02/10/2015 Initial Biopsy    Right needle core bx LOQ: Invasive ductal carcinoma, grade 1, ER+ (100%), PR+ (60%), HER2/neu negative (ratio 1.13), Ki67 3%; DCIS.        02/12/2015 Breast MRI    Biopsy-proven malignancy in the central right breast at posterior depth. No MR findings to suggest multicentric or  contralateral malignancy.      02/12/2015 Clinical Stage    Stage IA: T1b N0      03/21/2015 Definitive Surgery    Right breast lumpectomy / [facility] Dalbert Batman): Invasive ductal carcinoma, grade 1, 1.1 cm, ER+ (100%), PR+ (73%), HER-2 negative (ratio 1.3), Ki67 6%, negative margins / no lymphovascular invasion; DCIS.  One lymph node negative for tumor (0/1).        03/21/2015 Oncotype testing    RS 7, low risk. (ROR 5% for 10-year recurrence with Tamoxifen alone).       03/21/2015 Pathologic Stage    Stage IA: pT1c pN0      05/07/2015 - 06/23/2015 Radiation Therapy    Adjuvant RT completed Pablo Ledger): Right breast 45 Gy over 25 fractions.  Right breast boost 16 Gy over 8 fractions. Total dose: 60 Gy.      06/02/2015 -  Anti-estrogen oral therapy    Aromasin 25 mg once daily Burr Medico).  Planned duration of therapy: 5 years.      08/15/2015 Survivorship    Survivorship visit completed and copy of survivorship care plan provided to patient.       CURRENT THERAPY: aromasin 40m daily, started on 06/02/2015   HISTORY OF PRESENTING ILLNESS:  Kirsten HUGE65y.o. female is here because of newly diagnosed breast cancer.  This was discovered by screening mammogtram. Her last screening mammogram in February 2015 was normal.  The screening mammogram on 01/23/2015 showed a possible mass in the right breast.  She underwent diagnostic mammogram and ultrasound on 02/03/2015, which showed a 5 x 5 x 5 mm irregular mass in the right breast 6:00 position.  Your biopsy of the mass showed invasive ductal carcinoma and DCIS.   She has multiple medical problems. She has chronic arthritis, with diffuse join pain, 8/10, she takes tylenol. No chest pain, (+) dyspena on moderate exertion, no nausea, abdominal bloating or change of her bowel habits. She is able to do most of her ADLs, limited light housework, not physically active, spends quite a bit of time sitting during the daytime.  She was hospitalized for UTI in  9/15 and went to rehab for a month. She has been using crane more regularly after that.   INTERIM HISTORY: Doing moderately well at home Still has moderate arthralgia, especially the knees, no significant change since she started exemestane Mild nausea in the morning, no vomiting  Fatigued, mild, able to tolerate routine activities at home, exercise sometime Eats well Tolerating Exemestane well, no side effects    MEDICAL HISTORY:  Past Medical History:  Diagnosis Date  . Anemia   . Arthritis   . Asthma   . Breast cancer of lower-outer quadrant of right female breast ([place]) 02/13/2015  . Cataract   . CHF, acute ([place]) 05/03/2012  . Depression   . Diabetes mellitus   . Eczema   . Fatty liver    Fatty infiltration of liver noted on 03/2012 CT scan  . Fibromyalgia   . GERD (gastroesophageal reflux disease)   . Heart disease   . History of kidney stones    w/ hx of hydronephrosis - followed by [facility]  . HIV nonspecific serology    2006: indeterminate HIV blood test, seen by ID, felt secondary to cross reacting antibodies with no further workup felt necessary at that time   . Hypertension   . Obesity   . Radiation 05/07/15-06/23/15   Right Breast   GYN HISTORY  Menarchal: 11 LMP: Several years ago Contraceptive: 1979-1986 HRT: NO G3P2:  SURGICAL HISTORY: Past Surgical History:  Procedure Laterality Date  . CHOLECYSTECTOMY  2003  . CYSTOSCOPY W/ LITHOLAPAXY / EHL    . JOINT REPLACEMENT     bilateral knee replacement  . LEFT AND RIGHT HEART CATHETERIZATION WITH CORONARY ANGIOGRAM N/A 05/02/2012   Procedure: LEFT AND RIGHT HEART CATHETERIZATION WITH CORONARY ANGIOGRAM;  Surgeon: Burnell Blanks, MD;  Location: [facility] CATH LAB;  Service: Cardiovascular;  Laterality: N/A;  . RADIOACTIVE SEED GUIDED MASTECTOMY WITH AXILLARY SENTINEL LYMPH NODE BIOPSY Right 03/21/2015   Procedure: RIGHT  PARTIAL MASTECTOMY WITH RADIOACTIVE SEED LOCALIZATION RIGHT  AXILLARY SENTINEL LYMPH  NODE BIOPSY;  Surgeon: Fanny Skates, MD;  Location: [facility];  Service: General;  Laterality: Right;  . REPLACEMENT TOTAL KNEE BILATERAL  2005 &2006  . VASCULAR SURGERY Right 03/15/2013   Ultrasound guided sclerotherapy    SOCIAL HISTORY: History   Social History  . Marital Status: Widowed     Spouse Name: [name]  . Number of Children: 3  . Years of Education: 14   Occupational History  . retired-CNA, housekeeping    Social History Main Topics  . Smoking status: Never Smoker   . Smokeless tobacco: Never Used  . Alcohol Use: No  . Drug Use: No  . Sexual Activity: No   Other Topics Concern  . Not on file   Social History Narrative   Health Care POA:    Emergency Contact: son, Autie Vasudevan [phone]  End of Life Plan:    Who lives with you:  husband Darrall Dears- Latrecia is his primary care giver   Any pets: Ripley   Diet: Pt has a varied diet.  Is currently working on smaller portions sizes and no late night snacking for weight loss.   Exercise: Pt exercises 1x weekly with church group.   Seatbelts: Pt reports wearing seatbelt when in vehicles.    Hobbies: word searches, church, time with family and friends             FAMILY HISTORY: Family History  Problem Relation Age of Onset  . Diabetes Mother   . Stroke Mother   . Heart disease Father   . Anemia Father   . Cancer Sister 17    nose cancer   . Cancer Cousin 30    ovarian cancer   . Cancer Cousin 56    ovarian cancer     ALLERGIES:  has No Known Allergies.  MEDICATIONS:  Current Outpatient Prescriptions  Medication Sig Dispense Refill  . acetaminophen (TYLENOL 8 HOUR ARTHRITIS PAIN) 650 MG CR tablet Take 650 mg by mouth every 8 (eight) hours as needed for pain.    Marland Kitchen albuterol (PROVENTIL HFA;VENTOLIN HFA) 108 (90 Base) MCG/ACT inhaler Inhale 2 puffs into the lungs every 6 (six) hours as needed for wheezing. 1 Inhaler 3  . aspirin 81 MG tablet Take 81 mg by mouth daily.      Marland Kitchen atorvastatin (LIPITOR) 40 MG tablet take 1 tablet by mouth once daily 90 tablet 3  . benazepril (LOTENSIN) 20 MG tablet TAKE 1 TABLET BY MOUTH ONCE DAILY 90 tablet 3  . colchicine 0.6 MG tablet take 1 tablet by mouth once daily 90 tablet 1  . exemestane (AROMASIN) 25 MG tablet take 1 tablet by mouth once daily AFTER BREAKFAST 30 tablet 5  . fluticasone (FLOVENT HFA) 110 MCG/ACT inhaler Inhale 2 puffs into the lungs 2 (two) times daily. 12 g 6  . furosemide (LASIX) 40 MG tablet Take 1 tablet (20 mg) daily. 90 tablet 0  . gabapentin (NEURONTIN) 100 MG capsule TAKE 4 TO 5 CAPSULES BY MOUTH 3 TIMES A DAY 450 capsule 6  . metFORMIN (GLUCOPHAGE) 1000 MG tablet take 1 tablet by mouth twice a day with meals 180 tablet 3  . metoprolol succinate (TOPROL-XL) 25 MG 24 hr tablet Take 0.5 tablets (12.5 mg total) by mouth daily. 45 tablet 3  . potassium citrate (UROCIT-K) 10 MEQ (1080 MG) SR tablet Take 1 tablet (10 mEq total) by mouth 2 (two) times daily. 60 tablet 6  . ranitidine (ZANTAC) 150 MG tablet take 1 tablet by mouth at bedtime 30 tablet 11  . spironolactone (ALDACTONE) 25 MG tablet Take 1 tablet (25 mg total) by mouth daily. 90 tablet 1  . traMADol (ULTRAM) 50 MG tablet Take 1 tablet (50 mg total) by mouth every 8 (eight) hours as needed. 30 tablet 1   No current facility-administered medications for this visit.     REVIEW OF SYSTEMS:   Constitutional: Denies fevers, chills or abnormal night sweats Eyes: Denies blurriness of vision, double vision or watery eyes Ears, nose, mouth, throat, and face: Denies mucositis or sore throat Respiratory: Denies cough, dyspnea or wheezes Cardiovascular: Denies palpitation, chest discomfort or lower extremity swelling Gastrointestinal:  Denies nausea, heartburn or change in bowel habits Skin: Denies abnormal skin rashes Lymphatics: Denies new lymphadenopathy or easy bruising Neurological:Denies numbness, tingling or new weaknesses Behavioral/Psych: Mood  is  stable, no new changes  All other systems were reviewed with the patient and are negative.  PHYSICAL EXAMINATION: ECOG PERFORMANCE STATUS: 2 - Symptomatic, <50% confined to bed  Vitals:   10/05/16 0913  BP: (!) 149/97  Pulse: 83  Resp: 18  Temp: 97.7 F (36.5 C)   Filed Weights   10/05/16 0913  Weight: 222 lb 14.4 oz (101.1 kg)    GENERAL:alert, no distress and comfortable SKIN: skin color, texture, turgor are normal, no rashes or significant lesions EYES: normal, conjunctiva are pink and non-injected, sclera clear OROPHARYNX:no exudate, no erythema and lips, buccal mucosa, and tongue normal  NECK: supple, thyroid normal size, non-tender, without nodularity LYMPH:  no palpable lymphadenopathy in the cervical, axillary or inguinal LUNGS: clear to auscultation and percussion with normal breathing effort HEART: regular rate & rhythm and no murmurs and no lower extremity edema ABDOMEN:abdomen soft, non-tender and normal bowel sounds Musculoskeletal:no cyanosis of digits and no clubbing  PSYCH: alert & oriented x 3 with fluent speech NEURO: no focal motor/sensory deficits Breasts: Breast inspection showed them to be symmetrical with no nipple discharge. Lumpectomy surgical scar in right breast is healing well, (+) breast deformity in the inferior right breast secondary to surgery. (+) Diffuse skin pigmentation and mild lymphedema of the entire right breast and right axilla. Surgical wounds are healed well and there are scar tissue under the incision. Palpation of bilateral breasts feels lumpy, but no discrete mass. Palpitation of axilla revealed no obvious mass that I could appreciate.   LABORATORY DATA:  I have reviewed the data as listed  CBC Latest Ref Rng & Units 10/05/2016 06/03/2016 02/03/2016  WBC 3.9 - 10.3 10e3/uL 8.0 10.7(H) 7.6  Hemoglobin 11.6 - 15.9 g/dL 11.2(L) 11.1(L) 10.4(L)  Hematocrit 34.8 - 46.6 % 35.7 34.9 33.4(L)  Platelets 145 - 400 10e3/uL 172 184 165   CMP  Latest Ref Rng & Units 10/05/2016 06/03/2016 02/03/2016  Glucose 70 - 140 mg/dl 133 105 137  BUN 7.0 - 26.0 mg/dL 28.2(H) 29.0(H) 18.0  Creatinine 0.6 - 1.1 mg/dL 1.5(H) 1.3(H) 1.5(H)  Sodium 136 - 145 mEq/L 142 141 140  Potassium 3.5 - 5.1 mEq/L 5.2(H) 5.3 Hemolyzed Sample(H) 4.6  Chloride 98 - 110 mmol/L - - -  CO2 22 - 29 mEq/L 27 22 29   Calcium 8.4 - 10.4 mg/dL 9.5 9.7 9.1  Total Protein 6.4 - 8.3 g/dL 7.5 7.8 7.1  Total Bilirubin 0.20 - 1.20 mg/dL 0.40 0.38 0.65  Alkaline Phos 40 - 150 U/L 120 124 118  AST 5 - 34 U/L 14 16 32  ALT 0 - 55 U/L 12 13 33      Pathology 03/21/2015 Diagnosis 1. Breast, lumpectomy, right - INVASIVE DUCTAL CARCINOMA, SEE COMMENT. - DUCTAL CARCINOMA IN SITU. - NEGATIVE FOR LYMPH VASCULAR INVASION. - PREVIOUS BIOPSY SITE IDENTIFIED. - SEE TUMOR SYNOPTIC TEMPLATE BELOW. 2. Lymph node, sentinel, biopsy, right axillary - ONE LYMPH NODE, NEGATIVE FOR TUMOR (0/1).  1. BREAST, INVASIVE TUMOR, WITH LYMPH NODES PRESENT Specimen, including laterality and lymph node sampling (sentinel, non-sentinel): Right breast with sentinel lymph node sampling. Procedure: Lumpectomy. Histologic type: Grade: I of III Tubule formation: 1 Nuclear pleomorphism: 2 Mitotic:1 Tumor size (gross measurement): 1.1 cm Margins: Invasive, distance to closest margin: 0.9 cm (medial). In-situ, distance to closest margin: Same as invasive. If margin positive, focally or broadly: N/A Lymphovascular invasion: Absent. Ductal carcinoma in situ: Present. Grade: I of III Extensive intraductal component: Absent. Lobular neoplasia: Absent. Tumor focality: Unifocal. Treatment  effect: None. If present, treatment effect in breast tissue, lymph nodes or both: N/A Extent of tumor: Skin: N/A Nipple: N/A Skeletal muscle: N/A 2 of 4 FINAL for JETTIE, MANNOR ([id number]) Microscopic Comment(continued) Lymph nodes: Examined: 1 Sentinel 0 Non-sentinel 1 Total Lymph nodes with metastasis:  0 Isolated tumor cells (< 0.2 mm): N/A Micrometastasis: (> 0.2 mm and < 2.0 mm): N/A Macrometastasis: (> 2.0 mm): N/A Extracapsular extension: N/A Breast prognostic profile: Estrogen receptor: Not repeated, previous study demonstrated 100% positivity ([id number]). Progesterone receptor: Not repeated, previous study demonstrated 60% positivity ([id number]). Her 2 neu: Repeated, previous study demonstrated no amplification (1.70) ([id number]). Ki-67: Not repeated, previous study demonstrated 3% proliferation rate ([id number]). Non-neoplastic breast: Previous biopsy site related tissue changes. TNM: pT1c, pN0, pMX  Results: IMMUNOHISTOCHEMICAL AND MORPHOMETRIC ANALYSIS BY THE AUTOMATED CELLULAR IMAGING SYSTEM (ACIS) Estrogen Receptor: 100%, POSITIVE, STRONG STAINING INTENSITY Progesterone Receptor: 73%, POSITIVE, STRONG STAINING INTENSITY Proliferation Marker Ki67: 6%  1. CHROMOGENIC IN-SITU HYBRIDIZATION Results: HER-2/NEU BY CISH - NEGATIVE. RESULT RATIO OF HER2: CEP 17 SIGNALS 1.30 AVERAGE HER2 COPY NUMBER PER CELL 1.95  Oncotype Dx: RS 7, predicts 5% 10-year risk of distant recurrence with tamoxifen alone, low risk   RADIOGRAPHIC STUDIES: I have personally reviewed the radiological images as listed and agreed with the findings in the report.  01/27/2016  Mammogram  IMPRESSION: No evidence of malignancy within either breast. Expected postsurgical changes in the right breast.  RECOMMENDATION: Bilateral diagnostic mammogram in 1 year. RECOMMENDATION: Diagnostic mammogram and possibly ultrasound of the right breast.  Bone density scan 09/03/2015 ASSESSMENT: The BMD measured at Forearm Radius 33% is 0.925 g/cm2 with a T-score of 0.4. This patient is considered NORMAL according to [facility]) criteria.Lumbar spine was not utilized due to advanced degenerative changes.  ASSESSMENT & PLAN:  65 year old postmenopausal African-American American female,  screening detected right breast invasive ductal carcinoma and DCIS.  1. pT1c N0 M0, stage IA right breast invasive ductal carcinoma, ER positive PR positive HER-2 negative, Ki 67 6%, and DCIS -I discussed her imaging findings and surgical pathology results with her and her daughters in great details. -She has early-stage breast cancer, high possibility of cured by the complete surgical resection. -Reviewed the possibility of local and distant recurrence after surgery, although it's low, we recommend adjuvant radiation and endocrine therapy to reduce her risk. -Her Oncotype was low risk, no need adjuvant chemotherapy -She has started Aromasin, tolerating well so far, we'll continue, plan for total 5 years. -She is clinically doing well. Her recent mammogram in February 2017 was normal -Her physical exam and lab are unremarkable, no evidence of recurrence. -Continue breast cancer surveillance with annual mammogram, self and physician breast exam, I encouraged her to have healthy diet and to be physically active  2. Bone health -I discussed the side effect of osteopenia and osteoporosis from Aromasin - I encouraged her to continue calcium and vitamin D supplement  -Her bone density scan in 08/2015 was normal. We will continue monitoring every 2 years   3. Hypertension, diabetes, arthritis, CHF -She will continue follow-up with her primary care physician  4. CKD stage III  -Her GFR is in 40s, stable overall, continue monitoring -To avoid nephrotoxin  -She follows up with nephrology  5. Anemia -She has mild chronic normocytic anemia, Probably secondary to her CKD -Her prior study in 2014 showed elevated ferritin, normal iron -Overall stable, hemoglobin 11.2 today, we'll continue monitoring.  Follow up: -Return to clinic in 4 months with lab  -  Continue Aromasin, tolerating well. -Next mammogram in [place] 2018  All questions were answered. The patient knows to call the clinic with any  problems, questions or concerns. I spent 20 minutes counseling the patient face to face. The total time spent in the appointment was 25 minutes and more than 50% was on counseling.     Truitt Merle, MD 10/05/2016 10:57 AM

## 2016-10-05 NOTE — Telephone Encounter (Signed)
Correction to previous note...Marland KitchenMarland KitchenMarland Kitchen Patient given schedule for March

## 2016-10-05 NOTE — Telephone Encounter (Signed)
Patient to call breast center annual mammo.

## 2016-10-05 NOTE — Telephone Encounter (Signed)
Gave patient avs report and appointments for May  °

## 2016-10-26 ENCOUNTER — Other Ambulatory Visit: Payer: Self-pay | Admitting: Family Medicine

## 2016-11-10 ENCOUNTER — Encounter: Payer: Self-pay | Admitting: Family Medicine

## 2016-11-10 ENCOUNTER — Ambulatory Visit (INDEPENDENT_AMBULATORY_CARE_PROVIDER_SITE_OTHER): Payer: Medicare Other | Admitting: Family Medicine

## 2016-11-10 VITALS — BP 132/64 | HR 96 | Temp 98.3°F | Ht 62.0 in | Wt 226.0 lb

## 2016-11-10 DIAGNOSIS — E1169 Type 2 diabetes mellitus with other specified complication: Secondary | ICD-10-CM | POA: Diagnosis not present

## 2016-11-10 DIAGNOSIS — F8081 Childhood onset fluency disorder: Secondary | ICD-10-CM | POA: Diagnosis not present

## 2016-11-10 DIAGNOSIS — I10 Essential (primary) hypertension: Secondary | ICD-10-CM | POA: Diagnosis not present

## 2016-11-10 DIAGNOSIS — E782 Mixed hyperlipidemia: Secondary | ICD-10-CM | POA: Diagnosis not present

## 2016-11-10 DIAGNOSIS — M21969 Unspecified acquired deformity of unspecified lower leg: Secondary | ICD-10-CM | POA: Diagnosis not present

## 2016-11-10 DIAGNOSIS — J4520 Mild intermittent asthma, uncomplicated: Secondary | ICD-10-CM

## 2016-11-10 LAB — POCT GLYCOSYLATED HEMOGLOBIN (HGB A1C): HEMOGLOBIN A1C: 6.5

## 2016-11-10 NOTE — Patient Instructions (Addendum)
Good to see you today!  Thanks for coming in.  Take the Lasix (Furosemide) 1/2 tab a day.  If you do not have any swelling you can go to 1/2 tab every other day  Take the Ranitidine one tab at night only as needed for heart burn.    Metformin take one tab a day   Your weight is up about 4 lbs.  Work on eating less fattening foods  Use you cane all the time.    Come back in March - birthday time

## 2016-11-10 NOTE — Assessment & Plan Note (Signed)
Stable Using controller regularly and rescue rarely

## 2016-11-10 NOTE — Progress Notes (Addendum)
Subjective  Patient is presenting with the following illnesses  HYPERTENSION Disease Monitoring: Blood pressure range-not checking Chest pain, palpitations- no      Dyspnea- no Medications: Compliance- did not bring in medications but seems to know names Lightheadedness,Syncope- no   Edema- trace  DIABETES Disease Monitoring: Blood Sugar ranges-not checking Polyuria/phagia/dipsia- no      Visual problems- no Medications: Compliance- twice a day metformin Hypoglycemic symptoms- no  HYPERLIPIDEMIA Disease Monitoring: See symptoms for Hypertension Medications: Compliance- daily lipitor Right upper quadrant pain- no  Muscle aches- no  Stuttering Episodes These continue about the same frequency.  Can't be predicted very well.  Do not seem to be progressive   Monitoring Labs and Parameters Last A1C:  Lab Results  Component Value Date   HGBA1C 6.5 11/10/2016    Last Lipid:     Component Value Date/Time   CHOL 229 (H) 01/07/2016 1635   HDL 39 (L) 01/07/2016 1635   LDLDIRECT 36 03/31/2016 1027    Last Bmet  Potassium  Date Value Ref Range Status  10/05/2016 5.2 (H) 3.5 - 5.1 mEq/L Final   Sodium  Date Value Ref Range Status  10/05/2016 142 136 - 145 mEq/L Final   Creatinine  Date Value Ref Range Status  10/05/2016 1.5 (H) 0.6 - 1.1 mg/dL Final      Last BPs:  BP Readings from Last 3 Encounters:  11/10/16 132/64  10/05/16 (!) 149/97  09/08/16 112/72    Chief Complaint noted Review of Symptoms - see HPI PMH - Smoking status noted.     Objective Vital Signs reviewed Psych:  Cognition and judgment appear intact. Alert, communicative  and cooperative with normal attention span and concentration. No apparent delusions, illusions, hallucinations Heart - Regular rate and rhythm.  No murmurs, gallops or rubs.    Lungs:  Normal respiratory effort, chest expands symmetrically. Lungs are clear to auscultation, no crackles or wheezes. Extremities:  No cyanosis, trace  edema, no deformity noted with good range of motion of all major joints.   Able to stand walk across room turn return without assistance.  Did not use cane Was mildly unsteady on standing had to pause  Brief 20 seconds stuttering episode early in visit none after   Assessments/Plans  No problem-specific Assessment & Plan notes found for this encounter.   See Encounter view if individual problem A/Ps not visible See after visit summary for details of patient instuctions

## 2016-11-10 NOTE — Assessment & Plan Note (Signed)
Good control.  Will lower dose of metformin

## 2016-11-10 NOTE — Assessment & Plan Note (Signed)
Tolerating medium dose statin well with stable lfts

## 2016-11-10 NOTE — Assessment & Plan Note (Signed)
Unknown cause.  Seems relatively stable Continue to monitor

## 2016-11-10 NOTE — Assessment & Plan Note (Signed)
BP Readings from Last 3 Encounters:  11/10/16 132/64  10/05/16 (!) 149/97  09/08/16 112/72   Well controlled Continue current Rx

## 2016-11-27 ENCOUNTER — Other Ambulatory Visit: Payer: Self-pay | Admitting: Family Medicine

## 2016-11-29 ENCOUNTER — Other Ambulatory Visit: Payer: Self-pay | Admitting: Internal Medicine

## 2016-12-07 ENCOUNTER — Encounter: Payer: Self-pay | Admitting: Podiatry

## 2016-12-07 ENCOUNTER — Ambulatory Visit (INDEPENDENT_AMBULATORY_CARE_PROVIDER_SITE_OTHER): Payer: Medicare Other | Admitting: Podiatry

## 2016-12-07 VITALS — Ht 66.0 in | Wt 226.0 lb

## 2016-12-07 DIAGNOSIS — B351 Tinea unguium: Secondary | ICD-10-CM

## 2016-12-07 DIAGNOSIS — E1142 Type 2 diabetes mellitus with diabetic polyneuropathy: Secondary | ICD-10-CM | POA: Diagnosis not present

## 2016-12-07 DIAGNOSIS — M129 Arthropathy, unspecified: Secondary | ICD-10-CM | POA: Diagnosis not present

## 2016-12-07 NOTE — Progress Notes (Signed)
Patient ID: CYONNA LAIDIG, female   DOB: [birth year], 66 y.o.   MRN: [id number] Complaint:  Visit Type: Patient returns to my office for continued preventative foot care services. Complaint: Patient states" my nails have grown long and thick and become painful to walk and wear shoes" Patient has been diagnosed with DM with no complications. He presents for preventative foot care services. No changes to ROS  Podiatric Exam: Vascular: dorsalis pedis and posterior tibial pulses are palpable bilateral. Capillary return is immediate. Temperature gradient is WNL. Skin turgor WNL  Sensorium: Normal Semmes Weinstein monofilament test. Normal tactile sensation bilaterally. Nail Exam: Pt has thick disfigured discolored nails second and third toenails left foot. Ulcer Exam: There is no evidence of ulcer or pre-ulcerative changes or infection. Orthopedic Exam: Muscle tone and strength are WNL. No limitations in general ROM. No crepitus or effusions noted. Foot type and digits show no abnormalities. . Asymptomatic HAV B/L. Palpable pain at insertion plantar fascia B/L. Skin: No Porokeratosis. No infection or ulcers  Diagnosis:  Tinea unguium, , pain in left toes  Treatment & Plan Procedures and Treatment: Consent by patient was obtained for treatment procedures. The patient understood the discussion of treatment and procedures well. All questions were answered thoroughly reviewed. Debridement of mycotic and hypertrophic toenails, 1 through 5 bilateral and clearing of subungual debris. No ulceration, no infection noted.  Initiate diabetic shoe paperwork. Return Visit-Office Procedure: Patient instructed to return to the office for a follow up visit 3 months for continued evaluation and treatment.   Gardiner Barefoot DPM

## 2016-12-09 ENCOUNTER — Ambulatory Visit: Payer: Medicare Other | Admitting: *Deleted

## 2016-12-09 DIAGNOSIS — E1142 Type 2 diabetes mellitus with diabetic polyneuropathy: Secondary | ICD-10-CM

## 2016-12-09 NOTE — Progress Notes (Signed)
Patient ID: [name], female   DOB: Jan 29, 1951, 66 y.o.   MRN: [id number]   Patient presents to be scanned and measured for diabetic shoes and inserts.

## 2016-12-13 ENCOUNTER — Telehealth: Payer: Self-pay | Admitting: Family Medicine

## 2016-12-13 NOTE — Telephone Encounter (Signed)
-----   Message from Lind Covert, MD sent at 11/11/2016  9:15 AM EST ----- Regarding: DM eye exam  From: Lind Covert, MD Sent: 11/10/2016  3:14 PM To: Tia C Hill Subject: DM eye exam                   Dr Frederico Hamman 6/17 Thanks No rush!!  [name]   SORRY  [facility]

## 2016-12-13 NOTE — Telephone Encounter (Signed)
I have requested a DM eye exam report from [facility]. Their office will fax the report to our office.    [place]

## 2016-12-30 ENCOUNTER — Other Ambulatory Visit: Payer: Self-pay | Admitting: Hematology

## 2016-12-30 DIAGNOSIS — Z853 Personal history of malignant neoplasm of breast: Secondary | ICD-10-CM

## 2017-01-18 ENCOUNTER — Ambulatory Visit (INDEPENDENT_AMBULATORY_CARE_PROVIDER_SITE_OTHER): Payer: Medicare Other | Admitting: Podiatry

## 2017-01-18 DIAGNOSIS — M722 Plantar fascial fibromatosis: Secondary | ICD-10-CM

## 2017-01-18 DIAGNOSIS — E1142 Type 2 diabetes mellitus with diabetic polyneuropathy: Secondary | ICD-10-CM

## 2017-01-18 DIAGNOSIS — M129 Arthropathy, unspecified: Secondary | ICD-10-CM

## 2017-01-18 DIAGNOSIS — M2011 Hallux valgus (acquired), right foot: Secondary | ICD-10-CM

## 2017-01-18 DIAGNOSIS — E1140 Type 2 diabetes mellitus with diabetic neuropathy, unspecified: Secondary | ICD-10-CM | POA: Diagnosis not present

## 2017-01-18 DIAGNOSIS — M2021 Hallux rigidus, right foot: Secondary | ICD-10-CM | POA: Diagnosis not present

## 2017-01-18 DIAGNOSIS — E11610 Type 2 diabetes mellitus with diabetic neuropathic arthropathy: Secondary | ICD-10-CM

## 2017-01-18 MED ORDER — MELOXICAM 15 MG PO TABS: 15.0000 mg | ORAL_TABLET | Freq: Every day | ORAL | 0 refills | Status: DC

## 2017-01-18 NOTE — Progress Notes (Addendum)
Patient ID: TAHARI CALI, female   DOB: 1950/12/19, 66 y.o.   MRN: [id number] Complaint:  Visit Type: Patient returns to my office to pick up her newly ordered diabetic shoes.  She says she is having pain at heels both feet.  She says she does want a shot today.    Podiatric Exam: Vascular: dorsalis pedis and posterior tibial pulses are palpable bilateral. Capillary return is immediate. Temperature gradient is WNL. Skin turgor WNL  Sensorium: Normal Semmes Weinstein monofilament test. Normal tactile sensation bilaterally. Nail Exam: Pt has thick disfigured discolored nails second and third toenails left foot. Ulcer Exam: There is no evidence of ulcer or pre-ulcerative changes or infection. Orthopedic Exam: Muscle tone and strength are WNL. No limitations in general ROM. No crepitus or effusions noted. Foot type and digits show no abnormalities. . Asymptomatic HAV B/L. Palpable pain at insertion plantar fascia B/L. Skin: No Porokeratosis. No infection or ulcers  Diagnosis:  Diabetic neuropathy  HAV  Arthritis  B/L  Treatment & Plan Procedures and Treatment: Diabetic shoes were dispensed.  Patient presents today and was dispensed 0ne pair ( two units) of medically necessary extra depth shoes with three pair( six units) of custom molded multiple density inserts. The shoes and the inserts are fitted to the patients ' feet and are noted to fit well and are free of defect.  Length and width of the shoes are also acceptable.  Patient was given written and verbal  instructions for wearing.  If any concerns arrive with the shoes or inserts, the patient is to call the office.Patient is to follow up with doctor in six weeks. Mobic prescribed for her painful heels. Return Visit-Office Procedure: Patient instructed to return to the office for a follow up visit for scheduled preventive foot care services.   Gardiner Barefoot DPM

## 2017-01-24 ENCOUNTER — Other Ambulatory Visit: Payer: Self-pay | Admitting: Hematology

## 2017-01-24 ENCOUNTER — Other Ambulatory Visit: Payer: Self-pay | Admitting: Family Medicine

## 2017-01-27 ENCOUNTER — Ambulatory Visit
Admission: RE | Admit: 2017-01-27 | Discharge: 2017-01-27 | Disposition: A | Discharge status: Home / Self Care | Payer: Medicare Other | Source: Ambulatory Visit | Attending: Hematology | Admitting: Hematology

## 2017-01-27 DIAGNOSIS — R928 Other abnormal and inconclusive findings on diagnostic imaging of breast: Secondary | ICD-10-CM | POA: Diagnosis not present

## 2017-01-27 DIAGNOSIS — Z853 Personal history of malignant neoplasm of breast: Secondary | ICD-10-CM

## 2017-01-27 HISTORY — DX: Personal history of irradiation: Z92.3

## 2017-01-31 NOTE — Progress Notes (Signed)
[place]  [phone] [phone]  Clinic follow up Note   Patient Care Team: Lind Covert, MD as PCP - General (Family Medicine) Gevena Cotton, MD (Ophthalmology) Rana Snare, MD (Urology) Dr. Edwin Cap ([facility]) Fanny Skates, MD as Consulting Physician (General Surgery) Thea Silversmith, MD (Inactive) as Consulting Physician (Radiation Oncology) Mauro Kaufmann, RN as Registered Nurse Rockwell Germany, RN as Registered Nurse Holley Bouche, NP as Nurse Practitioner (Nurse Practitioner) Truitt Merle, MD as Consulting Physician (Hematology) Sylvan Cheese, NP as Nurse Practitioner (Nurse Practitioner) Gardiner Barefoot, DPM (Podiatry) 02/01/2017  CHIEF COMPLAINTS/PURPOSE OF CONSULTATION:  Follow up breast cancer  Oncology History     Breast cancer of lower-outer quadrant of right female breast   Staging form: Breast, AJCC 7th Edition     Clinical stage from 02/19/2015: Stage IA (T1b, N0, M0) - Unsigned       Staging comments: Staged at breast conference on 3.23.16      Pathologic stage from 03/24/2015: Stage IA (T1c, N0, cM0) - Signed by Enid Cutter, MD on 03/31/2015       Staging comments: Staged on final lumpectomy specimen by Dr. Donato Heinz.      Breast cancer of lower-outer quadrant of right female breast ([place])   01/23/2015 Mammogram    Possible mass in right breast warrants further evaluation.  No findings in the left breast suspicious for malignancy      02/03/2015 Breast US    Right breast: 5 x 5 x 5 mm irregular hypoechoic mass with internal color vascularity at 6 o'clock position, 4 cm from the nipple      02/10/2015 Initial Biopsy    Right needle core bx LOQ: Invasive ductal carcinoma, grade 1, ER+ (100%), PR+ (60%), HER2/neu negative (ratio 1.13), Ki67 3%; DCIS.        02/12/2015 Breast MRI    Biopsy-proven malignancy in the central right breast at posterior depth. No MR findings to suggest multicentric  or contralateral malignancy.      02/12/2015 Clinical Stage    Stage IA: T1b N0      03/21/2015 Definitive Surgery    Right breast lumpectomy / [facility] Dalbert Batman): Invasive ductal carcinoma, grade 1, 1.1 cm, ER+ (100%), PR+ (73%), HER-2 negative (ratio 1.3), Ki67 6%, negative margins / no lymphovascular invasion; DCIS.  One lymph node negative for tumor (0/1).        03/21/2015 Oncotype testing    RS 7, low risk. (ROR 5% for 10-year recurrence with Tamoxifen alone).       03/21/2015 Pathologic Stage    Stage IA: pT1c pN0      05/07/2015 - 06/23/2015 Radiation Therapy    Adjuvant RT completed Pablo Ledger): Right breast 45 Gy over 25 fractions.  Right breast boost 16 Gy over 8 fractions. Total dose: 60 Gy.      06/02/2015 -  Anti-estrogen oral therapy    Aromasin 25 mg once daily Burr Medico).  Planned duration of therapy: 5 years.      08/15/2015 Survivorship    Survivorship visit completed and copy of survivorship care plan provided to patient.       CURRENT THERAPY: aromasin 43m daily, started on 06/02/2015   HISTORY OF PRESENTING ILLNESS:  Kirsten HENSLER66y.o. female is here because of newly diagnosed breast cancer.  This was discovered by screening mammogtram. Her last screening mammogram in February 2015 was normal.  The screening mammogram on 01/23/2015 showed a possible mass in the right  breast. She underwent diagnostic mammogram and ultrasound on 02/03/2015, which showed a 5 x 5 x 5 mm irregular mass in the right breast 6:00 position.  Your biopsy of the mass showed invasive ductal carcinoma and DCIS.   She has multiple medical problems. She has chronic arthritis, with diffuse join pain, 8/10, she takes tylenol. No chest pain, (+) dyspena on moderate exertion, no nausea, abdominal bloating or change of her bowel habits. She is able to do most of her ADLs, limited light housework, not physically active, spends quite a bit of time sitting during the daytime.  She was hospitalized for UTI  in 9/15 and went to rehab for a month. She has been using crane more regularly after that.   INTERIM HISTORY: She returns for follow up. She is doing well today. She had her mammogram on 01/27/2017. She is doing well on the Exemestane. There is some stiffness in her joints, but otherwise she feels fine. Denies hot flashes, or any other concerns.   MEDICAL HISTORY:  Past Medical History:  Diagnosis Date  . Anemia   . Arthritis   . Asthma   . Breast cancer of lower-outer quadrant of right female breast ([place]) 02/13/2015  . Cataract   . CHF, acute ([place]) 05/03/2012  . Depression   . Diabetes mellitus   . Eczema   . Fatty liver    Fatty infiltration of liver noted on 03/2012 CT scan  . Fibromyalgia   . GERD (gastroesophageal reflux disease)   . Heart disease   . History of kidney stones    w/ hx of hydronephrosis - followed by [facility]  . HIV nonspecific serology    2006: indeterminate HIV blood test, seen by ID, felt secondary to cross reacting antibodies with no further workup felt necessary at that time   . Hypertension   . Obesity   . Personal history of radiation therapy 2016   right  . Radiation 05/07/15-06/23/15   Right Breast   GYN HISTORY  Menarchal: 11 LMP: Several years ago Contraceptive: 1979-1986 HRT: NO G3P2:  SURGICAL HISTORY: Past Surgical History:  Procedure Laterality Date  . BREAST BIOPSY Left 02/10/2015   malignant   . BREAST LUMPECTOMY Right 03/21/2015  . CHOLECYSTECTOMY  2003  . CYSTOSCOPY W/ LITHOLAPAXY / EHL    . JOINT REPLACEMENT     bilateral knee replacement  . LEFT AND RIGHT HEART CATHETERIZATION WITH CORONARY ANGIOGRAM N/A 05/02/2012   Procedure: LEFT AND RIGHT HEART CATHETERIZATION WITH CORONARY ANGIOGRAM;  Surgeon: Burnell Blanks, MD;  Location: [facility] CATH LAB;  Service: Cardiovascular;  Laterality: N/A;  . RADIOACTIVE SEED GUIDED MASTECTOMY WITH AXILLARY SENTINEL LYMPH NODE BIOPSY Right 03/21/2015   Procedure: RIGHT  PARTIAL MASTECTOMY  WITH RADIOACTIVE SEED LOCALIZATION RIGHT  AXILLARY SENTINEL LYMPH NODE BIOPSY;  Surgeon: Fanny Skates, MD;  Location: [facility];  Service: General;  Laterality: Right;  . REPLACEMENT TOTAL KNEE BILATERAL  2005 &2006  . VASCULAR SURGERY Right 03/15/2013   Ultrasound guided sclerotherapy    SOCIAL HISTORY: History   Social History  . Marital Status: Widowed     Spouse Name: [name]  . Number of Children: 3  . Years of Education: 14   Occupational History  . retired-CNA, housekeeping    Social History Main Topics  . Smoking status: Never Smoker   . Smokeless tobacco: Never Used  . Alcohol Use: No  . Drug Use: No  . Sexual Activity: No   Other Topics Concern  .  Not on file   Social History Narrative   Health Care POA:    Emergency Contact: son, Coleen Cardiff [phone]   End of Life Plan:    Who lives with you:  husband Darrall Dears- Kyndra is his primary care giver   Any pets: [place]   Diet: Pt has a varied diet.  Is currently working on smaller portions sizes and no late night snacking for weight loss.   Exercise: Pt exercises 1x weekly with church group.   Seatbelts: Pt reports wearing seatbelt when in vehicles.    Hobbies: word searches, church, time with family and friends             FAMILY HISTORY: Family History  Problem Relation Age of Onset  . Diabetes Mother   . Stroke Mother   . Heart disease Father   . Anemia Father   . Cancer Sister 17    nose cancer   . Cancer Cousin 30    ovarian cancer   . Cancer Cousin 10    ovarian cancer     ALLERGIES:  has No Known Allergies.  MEDICATIONS:  Current Outpatient Prescriptions  Medication Sig Dispense Refill  . acetaminophen (TYLENOL 8 HOUR ARTHRITIS PAIN) 650 MG CR tablet Take 650 mg by mouth every 8 (eight) hours as needed for pain.    Marland Kitchen albuterol (PROVENTIL HFA;VENTOLIN HFA) 108 (90 Base) MCG/ACT inhaler Inhale 2 puffs into the lungs every 6 (six) hours as needed for wheezing.  1 Inhaler 3  . aspirin 81 MG tablet Take 81 mg by mouth daily.    Marland Kitchen atorvastatin (LIPITOR) 40 MG tablet take 1 tablet by mouth once daily 90 tablet 3  . benazepril (LOTENSIN) 20 MG tablet TAKE 1 TABLET BY MOUTH ONCE DAILY 90 tablet 3  . colchicine 0.6 MG tablet take 1 tablet by mouth once daily 90 tablet 1  . exemestane (AROMASIN) 25 MG tablet TAKE 1 TABLET BY MOUTH ONCE A DAY AFTER BREAKFAST 30 tablet 5  . FLOVENT HFA 110 MCG/ACT inhaler INHALE 2 PUFFS INTO THE LUNGS 2 TIMES DAILY 1 Inhaler 11  . furosemide (LASIX) 20 MG tablet Take 1 tablet (20 mg total) by mouth daily. 90 tablet 1  . gabapentin (NEURONTIN) 100 MG capsule TAKE 4 TO 5 CAPSULES BY MOUTH 3 TIMES A DAY 450 capsule 6  . meloxicam (MOBIC) 15 MG tablet Take 1 tablet (15 mg total) by mouth daily. 30 tablet 0  . metFORMIN (GLUCOPHAGE) 1000 MG tablet Take 1 tablet (1,000 mg total) by mouth daily. 180 tablet 3  . metoprolol succinate (TOPROL-XL) 25 MG 24 hr tablet Take 0.5 tablets (12.5 mg total) by mouth daily. 45 tablet 3  . potassium citrate (UROCIT-K) 10 MEQ (1080 MG) SR tablet take 1 tablet by mouth twice a day 180 tablet 1  . ranitidine (ZANTAC) 150 MG tablet Take 1 tablet (150 mg total) by mouth at bedtime as needed for heartburn. 30 tablet 11  . spironolactone (ALDACTONE) 25 MG tablet take 1 tablet by mouth once daily 90 tablet 1  . traMADol (ULTRAM) 50 MG tablet Take 1 tablet (50 mg total) by mouth every 8 (eight) hours as needed. 30 tablet 1   No current facility-administered medications for this visit.     REVIEW OF SYSTEMS:   Constitutional: Denies fevers, chills or abnormal night sweats Eyes: Denies blurriness of vision, double vision or watery eyes Ears, nose, mouth, throat, and face: Denies mucositis or sore throat Respiratory: Denies  cough, dyspnea or wheezes Cardiovascular: Denies palpitation, chest discomfort or lower extremity swelling Gastrointestinal:  Denies nausea, heartburn or change in bowel habits Skin:  Denies abnormal skin rashes Lymphatics: Denies new lymphadenopathy or easy bruising Neurological:Denies numbness, tingling or new weaknesses Behavioral/Psych: Mood is stable, no new changes  Musculoskeletal: (+) joint stiffness  All other systems were reviewed with the patient and are negative.  PHYSICAL EXAMINATION: ECOG PERFORMANCE STATUS: 2 - Symptomatic, <50% confined to bed  Vitals:   02/01/17 1028  BP: (!) 152/82  Pulse: 86  Resp: 18  Temp: 97.6 F (36.4 C)   Filed Weights   02/01/17 1028  Weight: 234 lb 1.6 oz (106.2 kg)   GENERAL:alert, no distress and comfortable SKIN: skin color, texture, turgor are normal, no rashes or significant lesions EYES: normal, conjunctiva are pink and non-injected, sclera clear OROPHARYNX:no exudate, no erythema and lips, buccal mucosa, and tongue normal  NECK: supple, thyroid normal size, non-tender, without nodularity LYMPH:  no palpable lymphadenopathy in the cervical, axillary or inguinal (+) lower extremity edema  LUNGS: clear to auscultation and percussion with normal breathing effort HEART: regular rate & rhythm and no murmurs and no lower extremity edema ABDOMEN:abdomen soft, non-tender and normal bowel sounds Musculoskeletal:no cyanosis of digits and no clubbing  PSYCH: alert & oriented x 3 with fluent speech NEURO: no focal motor/sensory deficits Breasts: Breast inspection showed them to be symmetrical with no nipple discharge. Lumpectomy surgical scar in right breast is healing well, (+) breast deformity in the inferior right breast secondary to surgery. (+) Diffuse skin pigmentation and mild lymphedema of the entire right breast and right axilla. Surgical wounds are healed well and there are scar tissue under the incision. Palpation of bilateral breasts feels lumpy, but no discrete mass. Palpitation of axilla revealed no obvious mass that I could appreciate.  LABORATORY DATA:  I have reviewed the data as listed  CBC Latest Ref  Rng & Units 02/01/2017 10/05/2016 06/03/2016  WBC 3.9 - 10.3 10e3/uL 9.2 8.0 10.7(H)  Hemoglobin 11.6 - 15.9 g/dL 11.6 11.2(L) 11.1(L)  Hematocrit 34.8 - 46.6 % 36.0 35.7 34.9  Platelets 145 - 400 10e3/uL 168 172 184   CMP Latest Ref Rng & Units 02/01/2017 10/05/2016 06/03/2016  Glucose 70 - 140 mg/dl 201(H) 133 105  BUN 7.0 - 26.0 mg/dL 40.3(H) 28.2(H) 29.0(H)  Creatinine 0.6 - 1.1 mg/dL 1.6(H) 1.5(H) 1.3(H)  Sodium 136 - 145 mEq/L 140 142 141  Potassium 3.5 - 5.1 mEq/L 5.5(H) 5.2(H) 5.3 Hemolyzed Sample(H)  Chloride 98 - 110 mmol/L - - -  CO2 22 - 29 mEq/L _0 Calcium 8.4 - 10.4 mg/dL 9.8 9.5 9.7  Total Protein 6.4 - 8.3 g/dL 7.5 7.5 7.8  Total Bilirubin 0.20 - 1.20 mg/dL 0.59 0.40 0.38  Alkaline Phos 40 - 150 U/L 116 120 124  AST 5 - 34 U/L _1 ALT 0 - 55 U/L _2 Pathology 03/21/2015 Diagnosis 1. Breast, lumpectomy, right - INVASIVE DUCTAL CARCINOMA, SEE COMMENT. - DUCTAL CARCINOMA IN SITU. - NEGATIVE FOR LYMPH VASCULAR INVASION. - PREVIOUS BIOPSY SITE IDENTIFIED. - SEE TUMOR SYNOPTIC TEMPLATE BELOW. 2. Lymph node, sentinel, biopsy, right axillary - ONE LYMPH NODE, NEGATIVE FOR TUMOR (0/1).  1. BREAST, INVASIVE TUMOR, WITH LYMPH NODES PRESENT Specimen, including laterality and lymph node sampling (sentinel, non-sentinel): Right breast with sentinel lymph node sampling. Procedure: Lumpectomy. Histologic type: Grade: I of III Tubule formation: 1 Nuclear pleomorphism: 2 Mitotic:1 Tumor size (gross  measurement): 1.1 cm Margins: Invasive, distance to closest margin: 0.9 cm (medial). In-situ, distance to closest margin: Same as invasive. If margin positive, focally or broadly: N/A Lymphovascular invasion: Absent. Ductal carcinoma in situ: Present. Grade: I of III Extensive intraductal component: Absent. Lobular neoplasia: Absent. Tumor focality: Unifocal. Treatment effect: None. If present, treatment effect in breast tissue, lymph nodes or both: N/A Extent  of tumor: Skin: N/A Nipple: N/A Skeletal muscle: N/A 2 of 4 FINAL for MARBETH, SMEDLEY ([id number]) Microscopic Comment(continued) Lymph nodes: Examined: 1 Sentinel 0 Non-sentinel 1 Total Lymph nodes with metastasis: 0 Isolated tumor cells (< 0.2 mm): N/A Micrometastasis: (> 0.2 mm and < 2.0 mm): N/A Macrometastasis: (> 2.0 mm): N/A Extracapsular extension: N/A Breast prognostic profile: Estrogen receptor: Not repeated, previous study demonstrated 100% positivity ([id number]). Progesterone receptor: Not repeated, previous study demonstrated 60% positivity ([id number]). Her 2 neu: Repeated, previous study demonstrated no amplification (1.70) ([id number]). Ki-67: Not repeated, previous study demonstrated 3% proliferation rate ([id number]). Non-neoplastic breast: Previous biopsy site related tissue changes. TNM: pT1c, pN0, pMX  Results: IMMUNOHISTOCHEMICAL AND MORPHOMETRIC ANALYSIS BY THE AUTOMATED CELLULAR IMAGING SYSTEM (ACIS) Estrogen Receptor: 100%, POSITIVE, STRONG STAINING INTENSITY Progesterone Receptor: 73%, POSITIVE, STRONG STAINING INTENSITY Proliferation Marker Ki67: 6%  1. CHROMOGENIC IN-SITU HYBRIDIZATION Results: HER-2/NEU BY CISH - NEGATIVE. RESULT RATIO OF HER2: CEP 17 SIGNALS 1.30 AVERAGE HER2 COPY NUMBER PER CELL 1.95  Oncotype Dx: RS 7, predicts 5% 10-year risk of distant recurrence with tamoxifen alone, low risk   RADIOGRAPHIC STUDIES: I have personally reviewed the radiological images as listed and agreed with the findings in the report.  01/27/2017 Mammogram  IMPRESSION: Stable right breast lumpectomy site. No mammographic evidence of malignancy in the bilateral breasts.  Bone density scan 09/03/2015 ASSESSMENT: The BMD measured at Forearm Radius 33% is 0.925 g/cm2 with a T-score of 0.4. This patient is considered NORMAL according to [facility]) criteria.Lumbar spine was not utilized due to advanced degenerative  changes.  ASSESSMENT & PLAN:  66 y.o. postmenopausal African-American American female, screening detected right breast invasive ductal carcinoma and DCIS.  1. Breast cancer of lower-outer quadrant of right breast, invasive ductal carcinoma, pT1c N0 M0, stage IA, ER positive PR positive HER-2 negative, Ki 67 6%, and DCIS -I previously discussed her imaging findings and surgical pathology results with her and her daughters in great details. -She has early-stage breast cancer, high possibility of cured by the complete surgical resection. -Reviewed the possibility of local and distant recurrence after surgery, although it's low, we recommend adjuvant radiation and endocrine therapy to reduce her risk. -Her Oncotype was low risk, no need adjuvant chemotherapy -She has started Aromasin, tolerating well so far, we'll continue, plan for total 5 years. -She is clinically doing well. Her recent mammogram last week was normal -Her physical exam and lab are unremarkable, no concerns of recurrence. -Continue breast cancer surveillance with annual mammogram, self and physician breast exam, I encouraged her to have healthy diet and to be physically active  2. Bone health -I again discussed the side effect of osteopenia and osteoporosis from Aromasin - I encouraged her to continue calcium and vitamin D supplement  -Her bone density scan in 08/2015 was normal. We will continue monitoring every 2 years  3. Hypertension, diabetes, arthritis, CHF -She will continue follow-up with her primary care physician  4. CKD stage III  -Her GFR is in 40s, stable overall, continue monitoring -To avoid nephrotoxin  -She follows up with nephrology  5. Hyperkalemia -Her K  has been slightly elevated, she is on KCL oral 31mq daily and spur lactone -I recommend her to stop potassium until she sees her PCP  6. Anemia -She has mild chronic normocytic anemia, Probably secondary to her CKD -Her prior study in 2014 showed  elevated ferritin, normal iron -Overall stable, hemoglobin 11.6 today, we'll continue monitoring.  Plan -Return to clinic in 6 months with lab  -Order bone density scan at next visit for 08/2017.  -Continue Aromasin, tolerating well. -I will send a note to her PCP, Dr. CErin Hearingabout her Hyperkalemia    All questions were answered. The patient knows to call the clinic with any problems, questions or concerns.  I spent 20 minutes counseling the patient face to face. The total time spent in the appointment was 25 minutes and more than 50% was on counseling.  This document serves as a record of services personally performed by YTruitt Merle MD. It was created on her behalf by [email or web address], a trained medical scribe. The creation of this record is based on the scribe's personal observations and the provider's statements to them. This document has been checked and approved by the attending provider.  I have reviewed the above documentation for accuracy and completeness and I agree with the above.   FTruitt Merle MD 02/01/2017 12:08 PM

## 2017-02-01 ENCOUNTER — Other Ambulatory Visit (HOSPITAL_BASED_OUTPATIENT_CLINIC_OR_DEPARTMENT_OTHER): Payer: Medicare Other

## 2017-02-01 ENCOUNTER — Encounter: Payer: Self-pay | Admitting: Hematology

## 2017-02-01 ENCOUNTER — Ambulatory Visit (HOSPITAL_BASED_OUTPATIENT_CLINIC_OR_DEPARTMENT_OTHER): Payer: Medicare Other | Admitting: Hematology

## 2017-02-01 ENCOUNTER — Telehealth: Payer: Self-pay | Admitting: Hematology

## 2017-02-01 VITALS — BP 152/82 | HR 86 | Temp 97.6°F | Resp 18 | Ht 66.0 in | Wt 234.1 lb

## 2017-02-01 DIAGNOSIS — E875 Hyperkalemia: Secondary | ICD-10-CM

## 2017-02-01 DIAGNOSIS — I10 Essential (primary) hypertension: Secondary | ICD-10-CM

## 2017-02-01 DIAGNOSIS — I509 Heart failure, unspecified: Secondary | ICD-10-CM

## 2017-02-01 DIAGNOSIS — D649 Anemia, unspecified: Secondary | ICD-10-CM

## 2017-02-01 DIAGNOSIS — E119 Type 2 diabetes mellitus without complications: Secondary | ICD-10-CM | POA: Diagnosis not present

## 2017-02-01 DIAGNOSIS — C50511 Malignant neoplasm of lower-outer quadrant of right female breast: Secondary | ICD-10-CM

## 2017-02-01 DIAGNOSIS — N183 Chronic kidney disease, stage 3 (moderate): Secondary | ICD-10-CM | POA: Diagnosis not present

## 2017-02-01 DIAGNOSIS — Z170 Estrogen receptor positive status [ER+]: Secondary | ICD-10-CM | POA: Diagnosis not present

## 2017-02-01 DIAGNOSIS — M1990 Unspecified osteoarthritis, unspecified site: Secondary | ICD-10-CM | POA: Diagnosis not present

## 2017-02-01 DIAGNOSIS — I5042 Chronic combined systolic (congestive) and diastolic (congestive) heart failure: Secondary | ICD-10-CM

## 2017-02-01 LAB — CBC WITH DIFFERENTIAL/PLATELET
BASO%: 0.9 % (ref 0.0–2.0)
BASOS ABS: 0.1 10*3/uL (ref 0.0–0.1)
EOS%: 3.1 % (ref 0.0–7.0)
Eosinophils Absolute: 0.3 10*3/uL (ref 0.0–0.5)
HCT: 36 % (ref 34.8–46.6)
HEMOGLOBIN: 11.6 g/dL (ref 11.6–15.9)
LYMPH%: 30.4 % (ref 14.0–49.7)
MCH: 30.2 pg (ref 25.1–34.0)
MCHC: 32.2 g/dL (ref 31.5–36.0)
MCV: 93.6 fL (ref 79.5–101.0)
MONO#: 0.7 10*3/uL (ref 0.1–0.9)
MONO%: 8.1 % (ref 0.0–14.0)
NEUT#: 5.3 10*3/uL (ref 1.5–6.5)
NEUT%: 57.5 % (ref 38.4–76.8)
Platelets: 168 10*3/uL (ref 145–400)
RBC: 3.85 10*6/uL (ref 3.70–5.45)
RDW: 13.1 % (ref 11.2–14.5)
WBC: 9.2 10*3/uL (ref 3.9–10.3)
lymph#: 2.8 10*3/uL (ref 0.9–3.3)

## 2017-02-01 LAB — COMPREHENSIVE METABOLIC PANEL
ALBUMIN: 4 g/dL (ref 3.5–5.0)
ALT: 16 U/L (ref 0–55)
AST: 15 U/L (ref 5–34)
Alkaline Phosphatase: 116 U/L (ref 40–150)
Anion Gap: 13 mEq/L — ABNORMAL HIGH (ref 3–11)
BUN: 40.3 mg/dL — AB (ref 7.0–26.0)
CHLORIDE: 105 meq/L (ref 98–109)
CO2: 22 meq/L (ref 22–29)
Calcium: 9.8 mg/dL (ref 8.4–10.4)
Creatinine: 1.6 mg/dL — ABNORMAL HIGH (ref 0.6–1.1)
EGFR: 37 mL/min/{1.73_m2} — ABNORMAL LOW (ref >90)
GLUCOSE: 201 mg/dL — AB (ref 70–140)
POTASSIUM: 5.5 meq/L — AB (ref 3.5–5.1)
SODIUM: 140 meq/L (ref 136–145)
Total Bilirubin: 0.59 mg/dL (ref 0.20–1.20)
Total Protein: 7.5 g/dL (ref 6.4–8.3)

## 2017-02-01 NOTE — Telephone Encounter (Signed)
Gave patient avs report and appointments for September.  °

## 2017-02-09 ENCOUNTER — Ambulatory Visit (INDEPENDENT_AMBULATORY_CARE_PROVIDER_SITE_OTHER): Payer: Medicare Other | Admitting: Family Medicine

## 2017-02-09 ENCOUNTER — Encounter: Payer: Self-pay | Admitting: Family Medicine

## 2017-02-09 VITALS — BP 138/82 | HR 74 | Temp 98.1°F | Ht 66.0 in | Wt 231.0 lb

## 2017-02-09 DIAGNOSIS — E1169 Type 2 diabetes mellitus with other specified complication: Secondary | ICD-10-CM | POA: Diagnosis not present

## 2017-02-09 DIAGNOSIS — M21969 Unspecified acquired deformity of unspecified lower leg: Secondary | ICD-10-CM | POA: Diagnosis not present

## 2017-02-09 DIAGNOSIS — E782 Mixed hyperlipidemia: Secondary | ICD-10-CM | POA: Diagnosis not present

## 2017-02-09 DIAGNOSIS — I10 Essential (primary) hypertension: Secondary | ICD-10-CM

## 2017-02-09 LAB — POCT GLYCOSYLATED HEMOGLOBIN (HGB A1C): HEMOGLOBIN A1C: 8.1

## 2017-02-09 NOTE — Assessment & Plan Note (Signed)
Stable Check labs 

## 2017-02-09 NOTE — Patient Instructions (Signed)
Good to see you today!  Thanks for coming in.  Happy BD  Go up to 1 metformin twice a day   I agree exercise at the gym and outside when the weather is better  I will call you if your tests are not good.  Otherwise I will send you a letter.  If you do not hear from me with in 2 weeks please call our office.     Come back in 3-6 months to check your A1c

## 2017-02-09 NOTE — Assessment & Plan Note (Signed)
BP Readings from Last 3 Encounters:  02/09/17 138/82  02/01/17 (!) 152/82  11/10/16 132/64   Controlled.  Will check labs to follow up on hyperkalemia.

## 2017-02-09 NOTE — Assessment & Plan Note (Signed)
Not at goal.  Will resume twice a day metformin.  She feels she had too much candy with valentines and her recent bd

## 2017-02-09 NOTE — Progress Notes (Signed)
HYPERTENSION Disease Monitoring: Blood pressure range-has been good at office visits Chest pain, palpitations- no      Dyspnea- no Medications: Compliance- brings all her meds Lightheadedness,Syncope- no   Edema- no  DIABETES Disease Monitoring: Blood Sugar ranges-not checking Polyuria/phagia/dipsia- no      Visual problems- no Medications: Compliance- metformin once daily Hypoglycemic symptoms- no  HYPERLIPIDEMIA Disease Monitoring: See symptoms for Hypertension Medications: Compliance- daily lipitor Right upper quadrant pain- no  Muscle aches- no  Monitoring Labs and Parameters Last A1C:  Lab Results  Component Value Date   HGBA1C 8.1 02/09/2017    Last Lipid:     Component Value Date/Time   CHOL 229 (H) 01/07/2016 1635   HDL 39 (L) 01/07/2016 1635   LDLDIRECT 36 03/31/2016 1027    Last Bmet  Potassium  Date Value Ref Range Status  02/01/2017 5.5 (H) 3.5 - 5.1 mEq/L Final   Sodium  Date Value Ref Range Status  02/01/2017 140 136 - 145 mEq/L Final   Creatinine  Date Value Ref Range Status  02/01/2017 1.6 (H) 0.6 - 1.1 mg/dL Final      Last BPs:  BP Readings from Last 3 Encounters:  02/09/17 138/82  02/01/17 (!) 152/82  11/10/16 132/64   O Heart - Regular rate and rhythm.  No murmurs, gallops or rubs.    Lungs:  Normal respiratory effort, chest expands symmetrically. Lungs are clear to auscultation, no crackles or wheezes. Extremities:  No cyanosis, edema, or deformity noted with good range of motion of all major joints.

## 2017-02-28 ENCOUNTER — Other Ambulatory Visit: Payer: Self-pay | Admitting: Family Medicine

## 2017-03-08 ENCOUNTER — Encounter: Payer: Self-pay | Admitting: Podiatry

## 2017-03-08 ENCOUNTER — Ambulatory Visit (INDEPENDENT_AMBULATORY_CARE_PROVIDER_SITE_OTHER): Payer: Medicare Other | Admitting: Podiatry

## 2017-03-08 DIAGNOSIS — M2010 Hallux valgus (acquired), unspecified foot: Secondary | ICD-10-CM

## 2017-03-08 DIAGNOSIS — M722 Plantar fascial fibromatosis: Secondary | ICD-10-CM

## 2017-03-08 DIAGNOSIS — B351 Tinea unguium: Secondary | ICD-10-CM | POA: Diagnosis not present

## 2017-03-08 DIAGNOSIS — E1140 Type 2 diabetes mellitus with diabetic neuropathy, unspecified: Secondary | ICD-10-CM

## 2017-03-08 MED ORDER — MELOXICAM 15 MG PO TABS: 15.0000 mg | ORAL_TABLET | Freq: Every day | ORAL | 0 refills | Status: DC

## 2017-03-08 NOTE — Addendum Note (Signed)
Addended byDeidre Ala, Roddrick Sharron L on: 03/08/2017 02:24 PM   Modules accepted: Orders

## 2017-03-08 NOTE — Progress Notes (Signed)
Patient ID: [name], female   DOB: Jul 25, 1951, 66 y.o.   MRN: [id number] Complaint:  Visit Type: Patient returns to the office for preventative foot care services.  Patient says the diabetic shoes help her feet.  She says pain continues in both heels.  She requests Mobic to help her heel pain.    Podiatric Exam: Vascular: dorsalis pedis and posterior tibial pulses are palpable bilateral. Capillary return is immediate. Temperature gradient is WNL. Skin turgor WNL  Sensorium: Normal Semmes Weinstein monofilament test. Normal tactile sensation bilaterally. Nail Exam: Pt has thick disfigured discolored nails second and third toenails left foot. Ulcer Exam: There is no evidence of ulcer or pre-ulcerative changes or infection. Orthopedic Exam: Muscle tone and strength are WNL. No limitations in general ROM. No crepitus or effusions noted. Foot type and digits show no abnormalities. . Asymptomatic HAV B/L. Palpable pain at insertion plantar fascia B/L. Skin: No Porokeratosis. No infection or ulcers  Diagnosis:  Onychomycosis  Diabetes  Plantar fascitis.  Treatment & Plan Procedures and Treatment: Debridement and grinding of long painful nails both feet.. Mobic prescribed for her painful heels.RTC 3 months. Return Visit-Office Procedure: Patient instructed to return to the office for a follow up visit for scheduled preventive foot care services.   Gardiner Barefoot DPM

## 2017-04-28 ENCOUNTER — Other Ambulatory Visit: Payer: Self-pay | Admitting: Family Medicine

## 2017-04-28 DIAGNOSIS — M21969 Unspecified acquired deformity of unspecified lower leg: Principal | ICD-10-CM

## 2017-04-28 DIAGNOSIS — E1169 Type 2 diabetes mellitus with other specified complication: Secondary | ICD-10-CM

## 2017-05-04 ENCOUNTER — Ambulatory Visit (INDEPENDENT_AMBULATORY_CARE_PROVIDER_SITE_OTHER): Payer: Medicare Other | Admitting: Family Medicine

## 2017-05-04 ENCOUNTER — Encounter: Payer: Self-pay | Admitting: Family Medicine

## 2017-05-04 VITALS — BP 152/90 | HR 90 | Temp 98.2°F | Wt 233.4 lb

## 2017-05-04 DIAGNOSIS — R21 Rash and other nonspecific skin eruption: Secondary | ICD-10-CM

## 2017-05-04 DIAGNOSIS — I10 Essential (primary) hypertension: Secondary | ICD-10-CM

## 2017-05-04 DIAGNOSIS — E1169 Type 2 diabetes mellitus with other specified complication: Secondary | ICD-10-CM | POA: Diagnosis not present

## 2017-05-04 DIAGNOSIS — M21969 Unspecified acquired deformity of unspecified lower leg: Secondary | ICD-10-CM

## 2017-05-04 LAB — POCT GLYCOSYLATED HEMOGLOBIN (HGB A1C): Hemoglobin A1C: 7.4

## 2017-05-04 MED ORDER — METOPROLOL SUCCINATE ER 25 MG PO TB24: 25.0000 mg | ORAL_TABLET | Freq: Every day | ORAL | 3 refills | Status: DC

## 2017-05-04 NOTE — Assessment & Plan Note (Signed)
R arm.  Likely due to contact or sun exposure.  See after visit summary

## 2017-05-04 NOTE — Assessment & Plan Note (Signed)
Not at goal.  Suspect her home blood pressure readings are high because using a wrist cuff.  Will increase her metoprolol and recheck

## 2017-05-04 NOTE — Patient Instructions (Addendum)
Good to see you today!  Thanks for coming in.  For the rash -keep it covered when outside and use hydrocortisone twice a day  For the Blood pressure -take a whole metoprolol once a day.  If your pulse is less than 60 or you feel lots of lightheadness call me  Your diabetes is doing well   Come back in 1 month to check your blood pressure.  Bring your blood pressure cuff

## 2017-05-04 NOTE — Assessment & Plan Note (Signed)
Improved

## 2017-05-04 NOTE — Progress Notes (Signed)
Subjective  Patient is presenting with the following illnesses  RASH R forearm for several days.  Worse with sun exposure, itchy.  No where else.  No fevers or fatigue  HYPERTENSION Disease Monitoring: Blood pressure range-190/90 average -uses wrist cuff.  When nurse visited and took it was lower Chest pain, palpitations- no      Dyspnea- no Medications: Compliance- has all her meds Lightheadedness,Syncope- no   Edema- no  DIABETES Disease Monitoring: Blood Sugar ranges-not chcking Polyuria/phagia/dipsia- no      Visual problems- no Medications: Compliance- metformin bid Hypoglycemic symptoms- no  Monitoring Labs and Parameters Last A1C:  Lab Results  Component Value Date   HGBA1C 7.4 05/04/2017    Last Lipid:     Component Value Date/Time   CHOL 229 (H) 01/07/2016 1635   HDL 39 (L) 01/07/2016 1635   LDLDIRECT 36 03/31/2016 1027    Last Bmet  Potassium  Date Value Ref Range Status  02/01/2017 5.5 (H) 3.5 - 5.1 mEq/L Final   Sodium  Date Value Ref Range Status  02/01/2017 140 136 - 145 mEq/L Final   Creatinine  Date Value Ref Range Status  02/01/2017 1.6 (H) 0.6 - 1.1 mg/dL Final      Last BPs:  BP Readings from Last 3 Encounters:  05/04/17 (!) 152/90  02/09/17 138/82  02/01/17 (!) 152/82        Chief Complaint noted Review of Symptoms - see HPI PMH - Smoking status noted.     Objective Vital Signs reviewed R Forearm - diffuse slightly thickened skin without discrete lesions  Heart - Regular rate and rhythm.  No murmurs, gallops or rubs.    Lungs:  Normal respiratory effort, chest expands symmetrically. Lungs are clear to auscultation, no crackles or wheezes. Extremities:  No cyanosis, edema, or deformity noted with good range of motion of all major joints.       Assessments/Plans    See Encounter view if individual problem A/Ps not visible See after visit summary for details of patient instuctions

## 2017-05-05 DIAGNOSIS — E119 Type 2 diabetes mellitus without complications: Secondary | ICD-10-CM | POA: Diagnosis not present

## 2017-05-05 DIAGNOSIS — H538 Other visual disturbances: Secondary | ICD-10-CM | POA: Diagnosis not present

## 2017-05-05 DIAGNOSIS — H2513 Age-related nuclear cataract, bilateral: Secondary | ICD-10-CM | POA: Diagnosis not present

## 2017-05-10 DIAGNOSIS — M21969 Unspecified acquired deformity of unspecified lower leg: Secondary | ICD-10-CM | POA: Diagnosis not present

## 2017-05-10 DIAGNOSIS — Z9011 Acquired absence of right breast and nipple: Secondary | ICD-10-CM | POA: Diagnosis not present

## 2017-05-10 DIAGNOSIS — E1169 Type 2 diabetes mellitus with other specified complication: Secondary | ICD-10-CM | POA: Diagnosis not present

## 2017-05-10 DIAGNOSIS — C50111 Malignant neoplasm of central portion of right female breast: Secondary | ICD-10-CM | POA: Diagnosis not present

## 2017-05-10 DIAGNOSIS — I10 Essential (primary) hypertension: Secondary | ICD-10-CM | POA: Diagnosis not present

## 2017-05-16 ENCOUNTER — Telehealth: Payer: Self-pay | Admitting: Family Medicine

## 2017-05-16 NOTE — Telephone Encounter (Signed)
Rash: Have you keep rash covered when outside? Yes Have you used hydrocortisone twice a day? Yes Pt reports rash is better, but not much.  BP: How often do you take metoprolol? Once every morning How is your pulse? It's fine. This morning it was 91, yesterday it was 85, and Saturday (2 day ago) it was 95  - [name]

## 2017-05-25 DIAGNOSIS — H10413 Chronic giant papillary conjunctivitis, bilateral: Secondary | ICD-10-CM | POA: Diagnosis not present

## 2017-05-25 DIAGNOSIS — E119 Type 2 diabetes mellitus without complications: Secondary | ICD-10-CM | POA: Diagnosis not present

## 2017-05-25 DIAGNOSIS — H25813 Combined forms of age-related cataract, bilateral: Secondary | ICD-10-CM | POA: Diagnosis not present

## 2017-05-25 DIAGNOSIS — H401133 Primary open-angle glaucoma, bilateral, severe stage: Secondary | ICD-10-CM | POA: Diagnosis not present

## 2017-05-25 DIAGNOSIS — H04123 Dry eye syndrome of bilateral lacrimal glands: Secondary | ICD-10-CM | POA: Diagnosis not present

## 2017-05-30 DIAGNOSIS — H25812 Combined forms of age-related cataract, left eye: Secondary | ICD-10-CM | POA: Diagnosis not present

## 2017-05-30 DIAGNOSIS — H2512 Age-related nuclear cataract, left eye: Secondary | ICD-10-CM | POA: Diagnosis not present

## 2017-06-08 ENCOUNTER — Ambulatory Visit (INDEPENDENT_AMBULATORY_CARE_PROVIDER_SITE_OTHER): Payer: Medicare Other | Admitting: Podiatry

## 2017-06-08 ENCOUNTER — Encounter: Payer: Self-pay | Admitting: Podiatry

## 2017-06-08 DIAGNOSIS — M722 Plantar fascial fibromatosis: Secondary | ICD-10-CM

## 2017-06-08 DIAGNOSIS — B351 Tinea unguium: Secondary | ICD-10-CM

## 2017-06-08 DIAGNOSIS — M2010 Hallux valgus (acquired), unspecified foot: Secondary | ICD-10-CM

## 2017-06-08 DIAGNOSIS — E1140 Type 2 diabetes mellitus with diabetic neuropathy, unspecified: Secondary | ICD-10-CM | POA: Diagnosis not present

## 2017-06-08 MED ORDER — MELOXICAM 15 MG PO TABS: 15.0000 mg | ORAL_TABLET | Freq: Every day | ORAL | 0 refills | Status: DC

## 2017-06-08 NOTE — Addendum Note (Signed)
Addended byDeidre Ala, Tate Zagal L on: 06/08/2017 10:06 AM   Modules accepted: Orders

## 2017-06-08 NOTE — Progress Notes (Signed)
Patient ID: [name], female   DOB: [birth year], 66 y.o.   MRN: [id number] Complaint:  Visit Type: Patient returns to the office for preventative foot care services.  Patient says the diabetic shoes help her feet.    She requests Mobic to help her heel pain since they have improved since her last visit.    Podiatric Exam: Vascular: dorsalis pedis and posterior tibial pulses are palpable bilateral. Capillary return is immediate. Temperature gradient is WNL. Skin turgor WNL  Sensorium: Normal Semmes Weinstein monofilament test. Normal tactile sensation bilaterally. Nail Exam: Pt has thick disfigured discolored nails second and third toenails left foot. Ulcer Exam: There is no evidence of ulcer or pre-ulcerative changes or infection. Orthopedic Exam: Muscle tone and strength are WNL. No limitations in general ROM. No crepitus or effusions noted. Foot type and digits show no abnormalities. . Asymptomatic HAV B/L. Palpable pain at insertion plantar fascia B/L. Pes planus noted. Skin: No Porokeratosis. No infection or ulcers  Diagnosis:  Onychomycosis  Diabetes  Plantar fascitis.  Treatment & Plan Procedures and Treatment: Debridement and grinding of long painful nails both feet.. Mobic prescribed for her painful heels.RTC 3 months. Return Visit-Office Procedure: Patient instructed to return to the office for a follow up visit for scheduled preventive foot care services.   Gardiner Barefoot DPM

## 2017-06-09 DIAGNOSIS — H2511 Age-related nuclear cataract, right eye: Secondary | ICD-10-CM | POA: Diagnosis not present

## 2017-06-13 DIAGNOSIS — H25811 Combined forms of age-related cataract, right eye: Secondary | ICD-10-CM | POA: Diagnosis not present

## 2017-06-13 DIAGNOSIS — H2511 Age-related nuclear cataract, right eye: Secondary | ICD-10-CM | POA: Diagnosis not present

## 2017-06-15 ENCOUNTER — Encounter: Payer: Self-pay | Admitting: Family Medicine

## 2017-06-15 ENCOUNTER — Telehealth: Payer: Self-pay

## 2017-06-15 ENCOUNTER — Emergency Department (HOSPITAL_COMMUNITY)
Admission: EM | Admit: 2017-06-15 | Discharge: 2017-06-16 | Disposition: A | Discharge status: Home / Self Care | Payer: Medicare Other | Attending: Emergency Medicine | Admitting: Emergency Medicine

## 2017-06-15 ENCOUNTER — Encounter (HOSPITAL_COMMUNITY): Payer: Self-pay | Admitting: Emergency Medicine

## 2017-06-15 ENCOUNTER — Ambulatory Visit (INDEPENDENT_AMBULATORY_CARE_PROVIDER_SITE_OTHER): Payer: Medicare Other | Admitting: Family Medicine

## 2017-06-15 DIAGNOSIS — Z7984 Long term (current) use of oral hypoglycemic drugs: Secondary | ICD-10-CM | POA: Insufficient documentation

## 2017-06-15 DIAGNOSIS — B20 Human immunodeficiency virus [HIV] disease: Secondary | ICD-10-CM | POA: Insufficient documentation

## 2017-06-15 DIAGNOSIS — I5042 Chronic combined systolic (congestive) and diastolic (congestive) heart failure: Secondary | ICD-10-CM | POA: Insufficient documentation

## 2017-06-15 DIAGNOSIS — Z853 Personal history of malignant neoplasm of breast: Secondary | ICD-10-CM | POA: Diagnosis not present

## 2017-06-15 DIAGNOSIS — Z79899 Other long term (current) drug therapy: Secondary | ICD-10-CM | POA: Diagnosis not present

## 2017-06-15 DIAGNOSIS — J45909 Unspecified asthma, uncomplicated: Secondary | ICD-10-CM | POA: Insufficient documentation

## 2017-06-15 DIAGNOSIS — R040 Epistaxis: Secondary | ICD-10-CM

## 2017-06-15 DIAGNOSIS — I10 Essential (primary) hypertension: Secondary | ICD-10-CM

## 2017-06-15 DIAGNOSIS — E119 Type 2 diabetes mellitus without complications: Secondary | ICD-10-CM | POA: Insufficient documentation

## 2017-06-15 DIAGNOSIS — I110 Hypertensive heart disease with heart failure: Secondary | ICD-10-CM | POA: Diagnosis not present

## 2017-06-15 DIAGNOSIS — E785 Hyperlipidemia, unspecified: Secondary | ICD-10-CM | POA: Insufficient documentation

## 2017-06-15 DIAGNOSIS — Z7982 Long term (current) use of aspirin: Secondary | ICD-10-CM | POA: Diagnosis not present

## 2017-06-15 DIAGNOSIS — F8081 Childhood onset fluency disorder: Secondary | ICD-10-CM | POA: Diagnosis not present

## 2017-06-15 MED ORDER — OXYMETAZOLINE HCL 0.05 % NA SOLN
1.0000 | Freq: Once | NASAL | Status: AC
  Administered 2017-06-15: 1 via NASAL
  Filled 2017-06-15: qty 15

## 2017-06-15 NOTE — ED Provider Notes (Signed)
[facility] DEPT Provider Note   CSN: [id number] Arrival date & time: 06/15/17  2028  By signing my name below, I, Reola Mosher, attest that this documentation has been prepared under the direction and in the presence of Grey Rakestraw, Annie Main, MD. Electronically Signed: Reola Mosher, ED Scribe. 06/15/17. 11:30 PM.  History   Chief Complaint Chief Complaint  Patient presents with  . Epistaxis   The history is provided by the patient. No language interpreter was used.    HPI Comments: [name] is a 66 y.o. female with a h/o CHF, asthma, DM, who presents to the Emergency Department complaining of intermittent episodes of right-sided epistaxis beginning last night. Per daughter, pt recently had cataract removal of the right eye performed by Dr Katy Fitch, and additionally she has been having episodes of associated right haemolacria as well. She also notes some post-nasal drip. Pt was seen by her PCP today who advised her to call Dr Zenia Resides office about this issue. Pt's daughter reports that whe she called Dr Zenia Resides office that he did not feel it was related to her surgery. No h/o similar symptoms. No recent injury/trauma to the nose. No recent cough/cold symptoms. Pt has been applying pressure over the nose which will temporarily relieve her symptoms. Pt takes 81mg  Asprin daily, but is not on anticoagulant/antiplatelet therapy otherwise. Pt denies sore throat, rhinorrhea, cough, congestion, chest pain, shortness of breath, visual disturbance, visual changes/deficits, or any other associated symptoms.   Past Medical History:  Diagnosis Date  . Anemia   . Arthritis   . Asthma   . Breast cancer of lower-outer quadrant of right female breast ([facility]) 02/13/2015  . Cataract   . CHF, acute ([facility]) 05/03/2012  . Depression   . Diabetes mellitus   . Eczema   . Fatty liver    Fatty infiltration of liver noted on 03/2012 CT scan  . Fibromyalgia   . GERD (gastroesophageal reflux disease)     . Heart disease   . History of kidney stones    w/ hx of hydronephrosis - followed by [facility]  . HIV nonspecific serology    2006: indeterminate HIV blood test, seen by ID, felt secondary to cross reacting antibodies with no further workup felt necessary at that time   . Hypertension   . Obesity   . Personal history of radiation therapy 2016   right  . Radiation 05/07/15-06/23/15   Right Breast   Patient Active Problem List   Diagnosis Date Noted  . Rash and nonspecific skin eruption 05/04/2017  . Tremor of both hands   . Stuttering   . Essential hypertension   . Hyperlipidemia 08/13/2015  . Breast cancer of lower-outer quadrant of right female breast ([facility]) 02/13/2015  . Vitamin D deficiency 02/06/2015  . Type 2 diabetes mellitus with diabetic foot deformity ([facility]) 01/01/2014  . Chronic combined systolic and diastolic heart failure ([facility]) 02/26/2013  . OBESITY 02/05/2009  . Asthma, intermittent 02/08/2008  . NEPHROLITHIASIS 01/26/2007  . DJD (degenerative joint disease), multiple sites 01/26/2007   Past Surgical History:  Procedure Laterality Date  . BREAST BIOPSY Left 02/10/2015   malignant   . BREAST LUMPECTOMY Right 03/21/2015  . CHOLECYSTECTOMY  2003  . CYSTOSCOPY W/ LITHOLAPAXY / [facility]    . JOINT REPLACEMENT     bilateral knee replacement  . LEFT AND RIGHT HEART CATHETERIZATION WITH CORONARY ANGIOGRAM N/A 05/02/2012   Procedure: LEFT AND RIGHT HEART CATHETERIZATION WITH CORONARY ANGIOGRAM;  Surgeon: Burnell Blanks, MD;  Location: [facility];  Service: Cardiovascular;  Laterality: N/A;  . RADIOACTIVE SEED GUIDED MASTECTOMY WITH AXILLARY SENTINEL LYMPH NODE BIOPSY Right 03/21/2015   Procedure: RIGHT  PARTIAL MASTECTOMY WITH RADIOACTIVE SEED LOCALIZATION RIGHT  AXILLARY SENTINEL LYMPH NODE BIOPSY;  Surgeon: Fanny Skates, MD;  Location: [facility];  Service: General;  Laterality: Right;  . REPLACEMENT TOTAL KNEE BILATERAL  2005 &2006  . VASCULAR  SURGERY Right 03/15/2013   Ultrasound guided sclerotherapy   OB History    No data available     Home Medications    Prior to Admission medications   Medication Sig Start Date End Date Taking? Authorizing Provider  acetaminophen (TYLENOL 8 HOUR ARTHRITIS PAIN) 650 MG CR tablet Take 650 mg by mouth every 8 (eight) hours as needed for pain.    [provider]  albuterol (PROVENTIL HFA;VENTOLIN HFA) 108 (90 Base) MCG/ACT inhaler Inhale 2 puffs into the lungs every 6 (six) hours as needed for wheezing. 02/18/16   Lind Covert, MD  aspirin 81 MG tablet Take 81 mg by mouth daily.    [provider]  atorvastatin (LIPITOR) 40 MG tablet take 1 tablet by mouth once daily 09/27/16   Lind Covert, MD  benazepril (LOTENSIN) 20 MG tablet TAKE 1 TABLET BY MOUTH ONCE DAILY 09/27/16   Lind Covert, MD  colchicine 0.6 MG tablet TAKE 1 TABLET BY MOUTH ONCE DAILY 02/28/17   Lind Covert, MD  exemestane (AROMASIN) 25 MG tablet TAKE 1 TABLET BY MOUTH ONCE A DAY AFTER BREAKFAST 01/26/17   Truitt Merle, MD  FLOVENT HFA 110 MCG/ACT inhaler INHALE 2 PUFFS INTO THE LUNGS 2 TIMES DAILY 10/26/16   Lind Covert, MD  furosemide (LASIX) 20 MG tablet take 1 tablet by mouth daily 04/28/17   Lind Covert, MD  gabapentin (NEURONTIN) 100 MG capsule TAKE 4-5 TABLETS BY MOUTH 3 TIMES DAILY 02/28/17   Lind Covert, MD  meloxicam (MOBIC) 15 MG tablet Take 1 tablet (15 mg total) by mouth daily. 06/08/17   Gardiner Barefoot, DPM  metFORMIN (GLUCOPHAGE) 1000 MG tablet Take 1,000 mg by mouth 2 (two) times daily. 11/10/16   Lind Covert, MD  metoprolol succinate (TOPROL-XL) 25 MG 24 hr tablet Take 1 tablet (25 mg total) by mouth daily. 05/04/17   Lind Covert, MD  ranitidine (ZANTAC) 150 MG tablet Take 1 tablet (150 mg total) by mouth at bedtime as needed for heartburn. 11/10/16   Lind Covert, MD  spironolactone (ALDACTONE) 25 MG tablet take 1  tablet by mouth once daily 01/24/17   Lind Covert, MD  traMADol (ULTRAM) 50 MG tablet Take 1 tablet (50 mg total) by mouth every 8 (eight) hours as needed. 09/08/16   Lind Covert, MD   Family History Family History  Problem Relation Age of Onset  . Diabetes Mother   . Stroke Mother   . Heart disease Father   . Anemia Father   . Cancer Sister 17       nose cancer   . Cancer Cousin 30       ovarian cancer   . Cancer Cousin 70       ovarian cancer    Social History Social History  Substance Use Topics  . Smoking status: Never Smoker  . Smokeless tobacco: Never Used  . Alcohol use No   Allergies   Patient has no known allergies.  Review of Systems Review of Systems  A complete  review of systems was obtained and all systems are negative except as noted in the HPI and PMH.   Physical Exam Updated Vital Signs BP 140/85   Pulse 92   Temp 98.5 F (36.9 C) (Oral)   Resp 18   SpO2 100%   Physical Exam  Constitutional: She is oriented to person, place, and time. She appears well-developed and well-nourished. No distress.  Stuttering speech (which is baseline and normal, per family)  HENT:  Head: Normocephalic and atraumatic.  Mouth/Throat: Oropharynx is clear and moist. No oropharyngeal exudate.  Small amount of blood along pt's right globe from lacrimal duct. Right nare appears clear with scant amount of dried blood. No active bleeding. No blood in posterior pharynx.   Eyes: Pupils are equal, round, and reactive to light. Conjunctivae and EOM are normal.  Neck: Normal range of motion. Neck supple.  No meningismus.  Cardiovascular: Normal rate, regular rhythm, normal heart sounds and intact distal pulses.   No murmur heard. Pulmonary/Chest: Effort normal and breath sounds normal. No respiratory distress.  Abdominal: Soft. There is no tenderness. There is no rebound and no guarding.  Musculoskeletal: Normal range of motion. She exhibits no edema or  tenderness.  Neurological: She is alert and oriented to person, place, and time. No cranial nerve deficit. She exhibits normal muscle tone. Coordination normal.   5/5 strength throughout. CN 2-12 intact.Equal grip strength.   Skin: Skin is warm.  Psychiatric: She has a normal mood and affect. Her behavior is normal.  Nursing note and vitals reviewed.  ED Treatments / Results  DIAGNOSTIC STUDIES: Oxygen Saturation is 98% on RA, normal by my interpretation.   COORDINATION OF CARE: 11:30 PM-Discussed next steps with pt. Pt verbalized understanding and is agreeable with the plan.   Labs (all labs ordered are listed, but only abnormal results are displayed) Labs Reviewed  CBC WITH DIFFERENTIAL/PLATELET - Abnormal; Notable for the following:       Result Value   RBC 3.62 (*)    Hemoglobin 10.8 (*)    HCT 34.1 (*)    All other components within normal limits  BASIC METABOLIC PANEL - Abnormal; Notable for the following:    Glucose, Bld 299 (*)    BUN 44 (*)    Creatinine, Ser 1.62 (*)    GFR calc non Af Amer 32 (*)    GFR calc Af Amer 37 (*)    All other components within normal limits  PROTIME-INR    EKG  EKG Interpretation None      Radiology No results found.  Procedures .Epistaxis Management Date/Time: 06/16/2017 12:14 AM Performed by: Ezequiel Essex Authorized by: Ezequiel Essex   Consent:    Consent obtained:  Verbal   Consent given by:  Patient   Risks discussed:  Bleeding and infection   Alternatives discussed:  No treatment Universal protocol:    Procedure explained and questions answered to patient or proxy's satisfaction: yes     Relevant documents present and verified: yes     Test results available and properly labeled: yes     Imaging studies available: yes     Required blood products, implants, devices, and special equipment available: yes     Site/side marked: yes     Time out called: yes     Patient identity confirmed:  Verbally with patient,  arm band and provided demographic data Anesthesia (see MAR for exact dosages):    Anesthesia method:  None Procedure details:    Treatment site:  R anterior   Repair method: afrin.   Treatment complexity:  Limited   Treatment episode: recurring   Post-procedure details:    Assessment:  Bleeding stopped   Patient tolerance of procedure:  Tolerated well, no immediate complications    Medications Ordered in ED Medications  oxymetazoline (AFRIN) 0.05 % nasal spray 1 spray (1 spray Each Nare Given 06/15/17 2253)  oxymetazoline (AFRIN) 0.05 % nasal spray 1 spray (1 spray Each Nare Given 06/15/17 2336)   Initial Impression / Assessment and Plan / ED Course  I have reviewed the triage vital signs and the nursing notes.  Pertinent labs & imaging results that were available during my care of the patient were reviewed by me and considered in my medical decision making (see chart for details).    Intermittent R sided nose bleed x 1 day.  Recent cataract surgery 2 days ago, some bleeding from R eye.  Hemoglobin stable. Creatinine stable. Hyperglycemia without DKA.  Recheck 1 AM. Patient with no additional epistaxis. No blood in oropharynx. Discussed with patient to continue Afrin spray when necessary for the next 2 days. If bleeding recurs hold pressure for 20 minutes. Follow-up with ENT. Return precautions discussed.  Final Clinical Impressions(s) / ED Diagnoses   Final diagnoses:  Right-sided epistaxis   New Prescriptions New Prescriptions   No medications on file  I personally performed the services described in this documentation, which was scribed in my presence. The recorded information has been reviewed and is accurate.     Ezequiel Essex, MD 06/16/17 [phone]

## 2017-06-15 NOTE — Telephone Encounter (Signed)
Pt daughter [name] is calling. Pt was here today. Having a bad nose bleeds. Started a couple of minutes ago. Daughter would like to talk to [name], please call  [phone]. Ottis Stain, CMA

## 2017-06-15 NOTE — Assessment & Plan Note (Signed)
BP Readings from Last 3 Encounters:  06/15/17 (!) 142/82  05/04/17 (!) 152/90  02/09/17 138/82   Nearly at goal on multiple medications and high fall risk.  Safest too continue current regimen

## 2017-06-15 NOTE — ED Triage Notes (Signed)
Per daughter patient has had a nose bleed on and off since last night.  Also reports that blood has come from the right eye which had recent cataract removal.  Bleeding stopped at this time.

## 2017-06-15 NOTE — Patient Instructions (Addendum)
Good to see you today!  Thanks for coming in.  Your blood pressure by our readings is good.  I would not use the wrist cuff but if you do would subtract 40 from the top and 200 from the bottom.  You could get a arm cuff and try that  I would call your eye doctor and ask them about the bleeding.  Would also stop the mobic for a few days  I will refer you to neurology for a follow up on the stuttering

## 2017-06-15 NOTE — Telephone Encounter (Signed)
Spoke with daughter.  Bleeding currently stopped   She had spoken with Dr Katy Fitch who felt could be eye  Recommend to stop the mobic and follow up with Dr Katy Fitch  She agrees

## 2017-06-15 NOTE — ED Notes (Signed)
Pt nose started bleeding again, minimal bleeding, pt very anxious

## 2017-06-15 NOTE — Assessment & Plan Note (Signed)
Worsened but situational.  Not accompanied by other neuro symptoms.  She would like to discuss further with neurology

## 2017-06-15 NOTE — ED Notes (Signed)
ED Provider at bedside. 

## 2017-06-15 NOTE — Progress Notes (Signed)
Subjective  Patient is presenting with the following illnesses  HYPERTENSION Disease Monitoring  Home BP Monitoring (Severity) Brings home wrist cuff readings around 180-90/1low 100s Symptoms - Chest pain- no    Dyspnea- no Medications (Modifying factors) Compliance-  Daily brought in. Lightheadedness-  no  Edema- mild Timing - continuous  Duration - years ROS - See HPI  Stuttering Episodes Has these intermittently.  Seem to happen more when nervous or new situations.  Very severe today when first put in room.  No focal weakness or visuall changes  Nose Eye Bleed For last 2 dasys noticed blood in corner of her eye and some from her nose.  Did feel like tears in her nose.  No other bleeding.  Has been taking mobic regularly for foot pai.  Had Cataract surgery on Monday.  Her eye doctor thought it could be related to that  [facility] Review   Potassium  Date Value Ref Range Status  02/01/2017 5.5 (H) 3.5 - 5.1 mEq/L Final   Sodium  Date Value Ref Range Status  02/01/2017 140 136 - 145 mEq/L Final   Creatinine  Date Value Ref Range Status  02/01/2017 1.6 (H) 0.6 - 1.1 mg/dL Final        Chief Complaint noted Review of Symptoms - see HPI PMH - Smoking status noted.     Objective Vital Signs reviewed At beginning of visit stuttering badly with every word.  Worse when I made eye contact.  At end of visit speaking normally Able to get up on exam table without assistance Normal strength bilaterally PERRL Small amount of blood in corner of R eye. Normal nose exam without lesion   Her blood pressure cuff read 186/106.  My manual read was 142/84     Assessments/Plans  No problem-specific Assessment & Plan notes found for this encounter.  Nose Bleed Seems related to recent cataract surgery.  Asked to hold mobic for a few days and to follow up with her eye doctor See Encounter view if individual problem A/Ps not visible See after visit summary for details of patient  instuctions

## 2017-06-16 ENCOUNTER — Other Ambulatory Visit: Payer: Self-pay

## 2017-06-16 DIAGNOSIS — R040 Epistaxis: Secondary | ICD-10-CM | POA: Diagnosis not present

## 2017-06-16 LAB — CBC WITH DIFFERENTIAL/PLATELET
Basophils Absolute: 0 10*3/uL (ref 0.0–0.1)
Basophils Relative: 0 %
EOS ABS: 0.3 10*3/uL (ref 0.0–0.7)
EOS PCT: 3 %
HCT: 34.1 % — ABNORMAL LOW (ref 36.0–46.0)
Hemoglobin: 10.8 g/dL — ABNORMAL LOW (ref 12.0–15.0)
LYMPHS ABS: 3.3 10*3/uL (ref 0.7–4.0)
LYMPHS PCT: 37 %
MCH: 29.8 pg (ref 26.0–34.0)
MCHC: 31.7 g/dL (ref 30.0–36.0)
MCV: 94.2 fL (ref 78.0–100.0)
MONO ABS: 0.4 10*3/uL (ref 0.1–1.0)
Monocytes Relative: 5 %
Neutro Abs: 4.8 10*3/uL (ref 1.7–7.7)
Neutrophils Relative %: 55 %
PLATELETS: 185 10*3/uL (ref 150–400)
RBC: 3.62 MIL/uL — AB (ref 3.87–5.11)
RDW: 13.8 % (ref 11.5–15.5)
WBC: 8.8 10*3/uL (ref 4.0–10.5)

## 2017-06-16 LAB — BASIC METABOLIC PANEL
Anion gap: 10 (ref 5–15)
BUN: 44 mg/dL — AB (ref 6–20)
CALCIUM: 9.1 mg/dL (ref 8.9–10.3)
CHLORIDE: 107 mmol/L (ref 101–111)
CO2: 22 mmol/L (ref 22–32)
CREATININE: 1.62 mg/dL — AB (ref 0.44–1.00)
GFR calc Af Amer: 37 mL/min — ABNORMAL LOW (ref >60)
GFR calc non Af Amer: 32 mL/min — ABNORMAL LOW (ref >60)
GLUCOSE: 299 mg/dL — AB (ref 65–99)
Potassium: 4.3 mmol/L (ref 3.5–5.1)
Sodium: 139 mmol/L (ref 135–145)

## 2017-06-16 LAB — PROTIME-INR
INR: 0.95
PROTHROMBIN TIME: 12.7 s (ref 11.4–15.2)

## 2017-06-16 NOTE — Discharge Instructions (Signed)
Hold pressure for 20 minutes if bleeding recurs. Follow up with Dr. Janace Hoard.  Return to ED if bleeding persists.

## 2017-06-16 NOTE — Patient Outreach (Signed)
Outreach Patient after ED visit on 06/15/17.  Patient stated she is going to schedule follow up appointment with Doctor.  She knows how to reach Doctor after hours and she made note of [facility] 24 Hour Nurse Advice line.  I explained [facility] services to patient and engagement tool was completed.

## 2017-06-17 ENCOUNTER — Ambulatory Visit: Payer: Medicare Other

## 2017-06-17 ENCOUNTER — Encounter: Payer: Self-pay | Admitting: Family Medicine

## 2017-06-17 ENCOUNTER — Telehealth: Payer: Self-pay | Admitting: Family Medicine

## 2017-06-17 ENCOUNTER — Ambulatory Visit (INDEPENDENT_AMBULATORY_CARE_PROVIDER_SITE_OTHER): Payer: Medicare Other | Admitting: Family Medicine

## 2017-06-17 VITALS — BP 140/80 | HR 85 | Temp 98.3°F

## 2017-06-17 DIAGNOSIS — R040 Epistaxis: Secondary | ICD-10-CM

## 2017-06-17 LAB — POCT HEMOGLOBIN: Hemoglobin: 11.3 g/dL — AB (ref 12.2–16.2)

## 2017-06-17 NOTE — Telephone Encounter (Signed)
Called She was seen in clinic today  Nose not bleeding currently

## 2017-06-17 NOTE — Patient Instructions (Signed)
It was nice to meet you.  You were seen in clinic for your continued nosebleeding.  We checked your bloodwork today to make sure your hemoglobin level was not low.  I would advise you to not use Afrin spray for more than 3 days. Additionally, do not use your Meloxicam for now as this can make your bleeding worse.    Instead, you can try using a product called QuikClot which is available OTC at pharmacies.  In addition, I have provided you with an ENT referral today and you should be hearing back next week regarding an appointment to see a specialist.  Please call clinic with any questions.    Be well,  Lovenia Kim, MD      Nosebleed A nosebleed is when blood comes out of the nose. Nosebleeds are common. They are usually not a sign of a serious medical condition. Follow these instructions at home:  Try controlling your nosebleed by pinching your nostrils gently. Do this for at least 10 minutes.  Avoid blowing or sniffing your nose for a number of hours after having a nosebleed.  Do not put gauze inside of your nose yourself. If your nose was packed by your doctor, try to keep the pack inside of your nose until your doctor removes it. ? If a gauze pack was used and it starts to fall out, gently replace it or cut off the end of it. ? If a balloon catheter was used to pack your nose, do not cut or remove it unless told by your doctor.  Avoid lying down while you are having a nosebleed. Sit up and lean forward.  Use a nasal spray decongestant to help with a nosebleed as told by your doctor.  Do not use petroleum jelly or mineral oil in your nose. These can drip into your lungs.  Keep your house humid by using: ? Less air conditioning. ? A humidifier.  Aspirin and blood thinners make bleeding more likely. If you are prescribed these medicines and you have nosebleeds, ask your doctor if you should stop taking the medicines or adjust the dose. Do not stop medicines unless told by your  doctor.  Resume your normal activities as you are able. Avoid straining, lifting, or bending at your waist for several days.  If your nosebleed was caused by dryness in your nose, use over-the-counter saline nasal spray or gel. If you must use a lubricant: ? Choose one that is water-soluble. ? Use it only as needed. ? Do not use it within several hours of lying down.  Keep all follow-up visits as told by your doctor. This is important. Contact a health care provider if:  You have a fever.  You get frequent nosebleeds.  You are getting nosebleeds more often. Get help right away if:  Your nosebleed lasts longer than 20 minutes.  Your nosebleed occurs after an injury to your face, and your nose looks crooked or broken.  You have unusual bleeding from other parts of your body.  You have unusual bruising on other parts of your body.  You feel light-headed or dizzy.  You become sweaty.  You throw up (vomit) blood.  You have a nosebleed after a head injury. This information is not intended to replace advice given to you by your health care provider. Make sure you discuss any questions you have with your health care provider. Document Released: 08/24/2008 Document Revised: 06/18/2016 Document Reviewed: 07/01/2014 Elsevier Interactive Patient Education  [employer].

## 2017-06-17 NOTE — Progress Notes (Signed)
Subjective:   Patient ID: [name]    DOB: [birth year], 66 y.o. female   MRN: [id number]  CC: nosebleeds   HPI: [name] is a 66 y.o. female who presents to clinic today for follow up on nosebleeds.  Epistaxis She notes her nosebleeds initially began last Tuesday.  She recently had cataract surgery on Monday.  She saw [name] on Wednesday morning and he had suspected it to likely be due to her Meloxicam she is taking for heel spur pain.  Advised her to stop taking this and she has done so.  On Wednesday she noted her epistaxis had stopped however she subsequently had 3 more episodes of bleeding over the course of the day and presented to the ED with her daughter on Wednesday evening.  She was given Afrin spray and instructed to use this at home.  She has been holding pressure over her nose to stop bleeds and this intermittently works.  She has been using Afrin  nasal decongestant as instructed since then which does not seem to be helping.  On Thursday morning her bleeding restarted and she had 3 episodes of epistaxis over the course of the day.  Denies trauma to the nose and excessive blowing her nose.  She does have a history of asthma but currently is without allergic symptoms of rhinorrhea, congestion, fevers.  States she has a slight headache but has otherwise been feeling well.  She notes she feels dizzy at times and is concerned this is related to blood loss.  Bleeding is from her left nostril and she currently is not bleeding. Previous bleeding has been from right nare as well.  Does not have a history of nosebleeds in the past.  She would like to see a specialist to understand why this keeps happening.    Currently no active bleeding but did have one episode this morning and one while in the waiting room prior to office visit.  Bleeding is bright red, comes out as several drops at a time.  Has not noticed any big clots.    ROS: See HPI for pertinent ROS. [place]: Pertinent past  medical, surgical, family, and social history were reviewed and updated as appropriate. Smoking status reviewed.  Medications reviewed.  Objective:   BP 140/80   Pulse 85   Temp 98.3 F (36.8 C) (Oral)   SpO2 99%  Vitals and nursing note reviewed.  General: 66 yo F, well appearing, holding pressure over nose with tissue, in no acute distress  HEENT: NCAT, MMM, o/p clear, nares patent, small amount of dried blood at opening of L nare, no signs of active bleeding, skin appears intact with no visible abrasions or tears  Neck: supple, normal ROM  CV: RRR no MRG  Lungs: CTAB,WOB is comfortable  Abdomen: soft, NTND, +bs   Skin: warm, dry  Psych: appears anxious   Assessment & Plan:   Epistaxis Reports ongoing bleeding from L nare.  Currently no signs of active bleeding present to use silver nitrite.  Small amount of dried blood present in left nare from recent nosebleed in waiting room.  Patient denies dizziness, lightheadedness at this time.  Notes a mild headache.  No red flags on exam.   -Last CBC 7/19 with Hgb 10.8.  Hgb rechecked today and is stable at 11.3 with normal plt count of 185.  -Pt advised to use Quikclot available OTC at pharmacy and she is agreeable -Referral to ENT today  -Return precautions reviewed  and patient expresses good understanding    Orders Placed This Encounter  Procedures  . Ambulatory referral to ENT    Referral Priority:   Routine    Referral Type:   Consultation    Referral Reason:   Specialty Services Required    Requested Specialty:   Otolaryngology    Number of Visits Requested:   1  . Hemoglobin   Follow up: as needed   Lovenia Kim, MD [name], PGY-1 06/18/2017 5:23 PM

## 2017-06-17 NOTE — Telephone Encounter (Signed)
Pt would like PCP to call her because her nose is still bleeding. ep

## 2017-06-17 NOTE — Telephone Encounter (Signed)
Will forward to PCP. Patient seen in ED 06/15/17. Derl Barrow, RN

## 2017-06-18 DIAGNOSIS — R040 Epistaxis: Secondary | ICD-10-CM | POA: Insufficient documentation

## 2017-06-18 NOTE — Assessment & Plan Note (Addendum)
Reports ongoing bleeding from L nare.  Currently no signs of active bleeding present to use silver nitrite.  Small amount of dried blood present in left nare from recent nosebleed in waiting room.  Patient denies dizziness, lightheadedness at this time.  Notes a mild headache.  No red flags on exam.   -Last CBC 7/19 with Hgb 10.8.  Hgb rechecked today and is stable at 11.3 with normal plt count of 185.  -Pt advised to use Quikclot available OTC at pharmacy and she is agreeable -Referral to ENT today  -Return precautions reviewed and patient expresses good understanding

## 2017-06-21 ENCOUNTER — Other Ambulatory Visit: Payer: Self-pay

## 2017-06-21 NOTE — Patient Outreach (Signed)
[facility]) Care Management  04/15/3357  [name] [birth year] [id number]   Telephone Screen  Referral Date: 06/21/17 Referral Source: [place] Report/EMMI Prevent Call Referral Reason: "DM, HF, HTN" Insurance: [facility] Medicare & Medicaid   Outreach attempt #1 to patient. Spoke with patient. Reviewed and discussed [facility] services and patient's eligibility. She reports that she is already currently getting similar services through her [place]. She voices that she is enrolled in their wellness program and gets at least one call from a nurse there monthly if not more. Patient voices that she is able to manage her conditions and feels knowledgeable regarding them. She denies any issues related to transportation and meds. She has supportive family that assists with her care needs as needed. Patient states she does not feel like she needs [facility] [employer] services a this time but is appreciative to know that services are available in the future if need be. She is agreeable to RN CM mailing her out [facility] brochure and magnet for future reference.      Plan: RN CM will notify [facility] administrative assistant of case status.   Enzo Montgomery, RN,BSN,CCM [place] Management Telephonic Care Management Coordinator Direct Phone: [phone] Toll Free: [phone] Fax: [phone]

## 2017-06-27 ENCOUNTER — Encounter: Payer: Self-pay | Admitting: Neurology

## 2017-06-27 ENCOUNTER — Ambulatory Visit (INDEPENDENT_AMBULATORY_CARE_PROVIDER_SITE_OTHER): Payer: Medicare Other | Admitting: Neurology

## 2017-06-27 ENCOUNTER — Encounter: Payer: Self-pay | Admitting: Family Medicine

## 2017-06-27 VITALS — BP 147/94 | HR 75 | Ht 62.0 in | Wt 233.0 lb

## 2017-06-27 DIAGNOSIS — F8081 Childhood onset fluency disorder: Secondary | ICD-10-CM | POA: Diagnosis not present

## 2017-06-27 DIAGNOSIS — R251 Tremor, unspecified: Secondary | ICD-10-CM

## 2017-06-27 NOTE — Progress Notes (Signed)
Subjective:    Patient ID: [name] is a 66 y.o. female.  HPI     Interim history:  [name] is a 66 year old right-handed woman with an underlying complex medical history of type 2 diabetes, morbid obesity, chronic systolic CHF, gout, vitamin D deficiency, hyperlipidemia, back pain, arthritis, chronic pain, bilateral knee replacement surgeries, hypertension, depression, kidney stones, degenerative disc disease and asthma, who is referred for a new problem of stuttering. The patient is unaccompanied today. I last saw her on 08/03/2016, at which time she reported that her tremors were stable. Her exam was stable and I suggested as needed follow-up. She was walking with a cane, had no recent falls. She had some lower back pain and bilateral hip pain and knee pain.   Today, 06/27/2017 (all dictated new, as well as above notes, some dictation done in note pad or Word, outside of chart, may appear as copied):  She reports intermittent issues with stuttering, sometimes with flareup of her tremors in her hands but sometimes both symptoms independently. She does not note any triggers. She admits that she suffers from anxiety and stress but does not take any medication, does not feel she needs medication for this. She has intermittent symptoms, currently not bothered by tremor or stuttering.  Of note, she had a brain MRI without contrast on 06/03/2014 for indication of altered mental status, which I reviewed: IMPRESSION: Mild chronic microvascular ischemic changes in the white matter. No acute abnormality.   Sinusitis with air-fluid levels.  She had a brain MRI without contrast on 01/07/2016 for indication of intermittent stuttering which I reviewed: IMPRESSION: 1. No acute intracranial abnormality. 2. Mild chronic small vessel ischemic disease, unchanged.  The patient's allergies, current medications, family history, past medical history, past social history, past surgical history and problem  list were reviewed and updated as appropriate.   Previously (copied from previous notes for reference):   I first met her on 01/28/2016 after her recent hospitalization. I did not detect any telltale signs of parkinsonism at the time and no sinister tremor, tremor was mild at the time and we mutually agreed to monitor her tremor. I did not suggest any new medications at the time.   01/28/2016: I saw [name] as a referral for tremors from the hospital after her recent hospitalization. The patient is unaccompanied today. [name] is a 66 year old right-handed woman with an underlying complex medical history of type 2 diabetes, morbid obesity, chronic systolic CHF, gout, vitamin D deficiency, hyperlipidemia, back pain, arthritis, chronic pain, bilateral knee replacement surgeries, hypertension, depression, kidney stones, degenerative disc disease and asthma, who was recently in the hospital for chest pain. I reviewed the hospital records. I reviewed the discharge summary as well. She was admitted on 01/07/2016 and discharged on 01/09/2016. During the hospital stay she was found to have tremors. Upon admission the patient complained of chest pressure, shortness of breath and was  found to have stuttering. She was found to be hyperglycemic upon admission. Workup for acute coronary was negative, echocardiogram showed grade 1 diastolic dysfunction and EF of 30-35%. Cardiology was consulted. She was diuresed with Lasix. Initial EKG and repeat EKG were fairly unremarkable. She was found to have hyperlipidemia, she was found to have an elevated A1c of 10.8.    She had a brain MRI without contrast secondary to stuttering noted.    She had a brain MRI without contrast on 01/07/2016: IMPRESSION: 1. No acute intracranial abnormality. 2. Mild chronic small vessel  ischemic disease, unchanged. In addition, I personally reviewed the images through the PACS system.   She reports tremors in her hands for the  past 2 or so years. Symptoms have been mildly progressive. She noticed worsening when she went to the emergency room on 01/07/2016. Sometimes she has difficulty using her right hand in terms of tremor exacerbation. Generally symptoms are intermittent, sometimes on the left, sometimes on the right. She walks with a cane. She has hip pain, neck pain and back pain. She sees orthopedics. She has been on MSIR 15 mg 3 times a day. She is on a fairly high-dose of gabapentin, 500 mg in the morning and evening and 400 mg in midday.   There is no family history of tremors or Parkinson's    Her Past Medical History Is Significant For: Past Medical History:  Diagnosis Date  . Anemia   . Arthritis   . Asthma   . Breast cancer of lower-outer quadrant of right female breast ([place]) 02/13/2015  . Cataract   . CHF, acute ([place]) 05/03/2012  . Depression   . Diabetes mellitus   . Eczema   . Fatty liver    Fatty infiltration of liver noted on 03/2012 CT scan  . Fibromyalgia   . GERD (gastroesophageal reflux disease)   . Heart disease   . History of kidney stones    w/ hx of hydronephrosis - followed by [facility]  . HIV nonspecific serology    2006: indeterminate HIV blood test, seen by ID, felt secondary to cross reacting antibodies with no further workup felt necessary at that time   . Hypertension   . Obesity   . Personal history of radiation therapy 2016   right  . Radiation 05/07/15-06/23/15   Right Breast    Her Past Surgical History Is Significant For: Past Surgical History:  Procedure Laterality Date  . BREAST BIOPSY Left 02/10/2015   malignant   . BREAST LUMPECTOMY Right 03/21/2015  . CHOLECYSTECTOMY  2003  . CYSTOSCOPY W/ LITHOLAPAXY / EHL    . JOINT REPLACEMENT     bilateral knee replacement  . LEFT AND RIGHT HEART CATHETERIZATION WITH CORONARY ANGIOGRAM N/A 05/02/2012   Procedure: LEFT AND RIGHT HEART CATHETERIZATION WITH CORONARY ANGIOGRAM;  Surgeon: Burnell Blanks, MD;   Location: [facility] CATH LAB;  Service: Cardiovascular;  Laterality: N/A;  . RADIOACTIVE SEED GUIDED MASTECTOMY WITH AXILLARY SENTINEL LYMPH NODE BIOPSY Right 03/21/2015   Procedure: RIGHT  PARTIAL MASTECTOMY WITH RADIOACTIVE SEED LOCALIZATION RIGHT  AXILLARY SENTINEL LYMPH NODE BIOPSY;  Surgeon: Fanny Skates, MD;  Location: [place];  Service: General;  Laterality: Right;  . REPLACEMENT TOTAL KNEE BILATERAL  2005 &2006  . VASCULAR SURGERY Right 03/15/2013   Ultrasound guided sclerotherapy    Her Family History Is Significant For: Family History  Problem Relation Age of Onset  . Diabetes Mother   . Stroke Mother   . Heart disease Father   . Anemia Father   . Cancer Sister 17       nose cancer   . Cancer Cousin 30       ovarian cancer   . Cancer Cousin 50       ovarian cancer     Her Social History Is Significant For: Social History   Social History  . Marital status: Widowed    Spouse name: [name]  . Number of children: 3  . Years of education: 12   Occupational History  . retired-CNA, housekeeping Retired  Social History Main Topics  . Smoking status: Never Smoker  . Smokeless tobacco: Never Used  . Alcohol use No  . Drug use: No  . Sexual activity: No   Other Topics Concern  . None   Social History Narrative   Health Care POA:    Emergency Contact: son, Vonceil U[place] [phone]   End of Life Plan:    Who lives with you:  Daughter- [name] and granddaughter- [name]   Any pets: dog   Diet: Pt has a varied diet.  Reports eating 2 meals a day.    Exercise: walks daily 15-20 mins   Seatbelts: Pt reports wearing seatbelt when in vehicles.    Hobbies: word searches, church, time with family and friends, cooking, walking   Denies caffeine use              Her Allergies Are:  No Known Allergies:   Her Current Medications Are:  Outpatient Encounter Prescriptions as of 06/27/2017  Medication Sig  . acetaminophen (TYLENOL 8 HOUR ARTHRITIS PAIN)  650 MG CR tablet Take 650 mg by mouth every 8 (eight) hours as needed for pain.  Marland Kitchen albuterol (PROVENTIL HFA;VENTOLIN HFA) 108 (90 Base) MCG/ACT inhaler Inhale 2 puffs into the lungs every 6 (six) hours as needed for wheezing.  Marland Kitchen aspirin 81 MG tablet Take 81 mg by mouth daily.  Marland Kitchen atorvastatin (LIPITOR) 40 MG tablet take 1 tablet by mouth once daily  . benazepril (LOTENSIN) 20 MG tablet TAKE 1 TABLET BY MOUTH ONCE DAILY  . colchicine 0.6 MG tablet TAKE 1 TABLET BY MOUTH ONCE DAILY  . exemestane (AROMASIN) 25 MG tablet TAKE 1 TABLET BY MOUTH ONCE A DAY AFTER BREAKFAST  . FLOVENT HFA 110 MCG/ACT inhaler INHALE 2 PUFFS INTO THE LUNGS 2 TIMES DAILY  . furosemide (LASIX) 20 MG tablet take 1 tablet by mouth daily  . gabapentin (NEURONTIN) 100 MG capsule TAKE 4-5 TABLETS BY MOUTH 3 TIMES DAILY  . meloxicam (MOBIC) 15 MG tablet Take 1 tablet (15 mg total) by mouth daily.  . metFORMIN (GLUCOPHAGE) 1000 MG tablet Take 1,000 mg by mouth 2 (two) times daily.  . metoprolol succinate (TOPROL-XL) 25 MG 24 hr tablet Take 1 tablet (25 mg total) by mouth daily.  . ranitidine (ZANTAC) 150 MG tablet Take 1 tablet (150 mg total) by mouth at bedtime as needed for heartburn.  . spironolactone (ALDACTONE) 25 MG tablet take 1 tablet by mouth once daily  . traMADol (ULTRAM) 50 MG tablet Take 1 tablet (50 mg total) by mouth every 8 (eight) hours as needed.   No facility-administered encounter medications on file as of 06/27/2017.   :  Review of Systems:  Out of a complete 14 point review of systems, all are reviewed and negative with the exception of these symptoms as listed below: Review of Systems  Neurological:       Pt presents today to discuss her stuttering and tremor. Pt notices that her stuttering comes and goes and she does not know what triggers it. Sometimes, her tremor and stuttering are together, but other times they come on separately.    Objective:  Neurological Exam  Physical Exam Physical  Examination:   Vitals:   06/27/17 1003  BP: (!) 147/94  Pulse: 75   General Examination: The patient is a very pleasant 66 y.o. female in no acute distress. She appears well-developed and well-nourished and adequately groomed.   HEENT: Normocephalic, atraumatic, pupils are equal, round and reactive to light  and accommodation. Mild b/l cataracts noted. Extraocular tracking is good without limitation to gaze excursion or nystagmus noted. Normal smooth pursuit is noted. Hearing is grossly intact. Face is symmetric with normal facial animation and normal facial sensation. Speech is clear with no dysarthria noted, no stuttering. There is no hypophonia. There is no lip, neck/head, jaw or voice tremor. Neck is supple with full range of passive and active motion. There are no carotid bruits on auscultation. Oropharynx exam reveals: mild mouth dryness, adequate dental hygiene and moderate airway crowding.   Chest: Clear to auscultation without wheezing, rhonchi or crackles noted.  Heart: S1+S2+0, regular and normal without murmurs, rubs or gallops noted.   Abdomen: Soft, non-tender and non-distended with normal bowel sounds appreciated on auscultation.  Extremities: There is 1 to 2+ pitting edema in the distal lower extremities bilaterally, is wearing compression stockings b/l up to the knees.   Skin: Warm and dry without trophic changes noted. There are no varicose veins.  Musculoskeletal: exam reveals no obvious joint deformities, tenderness or joint swelling or erythema, except b/l knee pain.   Neurologically:  Mental status: The patient is awake, alert and oriented in all 4 spheres. Her immediate and remote memory, attention, language skills and fund of knowledge are fairly appropriate. There is no evidence of aphasia, agnosia, apraxia or anomia. Speech is clear with normal prosody and enunciation, but she answers in short sentences, not new. Mood appears to be constricted and affect is  blunted, not new appearing on my exam.  Cranial nerves II - XII are as described above under HEENT exam. In addition: shoulder shrug is normal with equal shoulder height noted. Motor exam: Normal bulk, strength and tone is noted. There is no drift, or rebound. There is no resting tremor. There is no postural or action, or intention tremor. (On 01/28/16: On Archimedes spiral drawing there is mild tremulousness noted. Handwriting is minimally tremulous and very legible, not particularly micrographic).   Romberg is negative. Relexes are 1+ in the upper extremities, trace in both knees, absent in both ankles. Fine motor skills are mildly impaired bilaterally but no lateralization is noted. There is no past pointing, heel-to-shin is not possible for her. Sensory exam intact in the UEs and LEs, but did not have her take off compression stockings.  Gait, station and balance: She stands up with mild difficulty. She reports b/l knee pain. She has a single point cane. Can walk some without the cane, mild difficulty turning, posture seems age-appropriate, walks slowly.   Assessment and Plan:   In summary, [name] is a 66 year old female with an underlying complex medical history of type 2 diabetes, morbid obesity, chronic systolic CHF, gout, vitamin D deficiency, hyperlipidemia, back pain, knee pain, hypertension, depression, kidney stones, degenerative disc disease and asthma, who presents for initial consultation of her intermittent stuttering. On examination she has remained stable, and no significant tremor or stuttering is noted. I explained to her that stuttering is rarely a prirarely neurological problem and especially in isolation is typically not a sign of a sinister underlying neurological cause. It is typically considered a developmental phenomenon most children or adolescents grow out of it. Some adults continue to stutter. Stuttering tends to get worse with anxiety or stress or nervousness. Her  neurological exam is non-focal, also no evidence of tremor today. She had brain MRI testing twice in the past 3 years, with no acute findings, non-revealing and therefore, we do not need to repeat a brain scan.  She is advised to talk to her primary care physician about stress and anxiety management and potentially seeing a speech therapist if needed. I will see her back on an as-needed basis. I answered all her questions today and she was in agreement.  I spent 25 minutes in total face-to-face time with the patient, more than 50% of which was spent in counseling and coordination of care, reviewing test results, reviewing medication and discussing or reviewing the diagnosis of stuttering and tremor, the prognosis and treatment options. Pertinent laboratory and imaging test results that were available during this visit with the patient were reviewed by me and considered in my medical decision making (see chart for details).

## 2017-06-27 NOTE — Patient Instructions (Signed)
Stuttering is rarely a prirarely neurological problem and especially in isolation is typically not a sign of a sinister underlying neurological cause. It is typically considered a developmental phenomenon most children or adolescents grow out of it. Some adults continue to stutter. Stuttering tends to get worse with anxiety or stress or nervousness. Your neurological exam is otherwise nonfocal, also no evidence of tremor today. You had a brain MRI in Feb. 2017, which was non-revealing and therefore, we do not need to repeat your brain scan. You had a brain MRI also in July 2015 for stuttering, with no acute or serious findings. Please talk to your primary care physician about stress and anxiety management and potentially seeing a speech therapist if needed. I will see you back on an as-needed basis.

## 2017-07-06 ENCOUNTER — Ambulatory Visit (INDEPENDENT_AMBULATORY_CARE_PROVIDER_SITE_OTHER): Payer: Medicare Other | Admitting: Family Medicine

## 2017-07-06 ENCOUNTER — Encounter: Payer: Self-pay | Admitting: Family Medicine

## 2017-07-06 DIAGNOSIS — R040 Epistaxis: Secondary | ICD-10-CM

## 2017-07-06 DIAGNOSIS — I10 Essential (primary) hypertension: Secondary | ICD-10-CM | POA: Diagnosis not present

## 2017-07-06 NOTE — Assessment & Plan Note (Signed)
Seems to have resolved. She will follow up with ENT

## 2017-07-06 NOTE — Patient Instructions (Addendum)
Good to see you today!  Thanks for coming in.  Come back in November  Watch your weight and your blood pressure  Call me if your blood pressure is > 140/90 usuallly with your new cuff  Ask your eye doctor to send me a report  Flu shot and pneumonia shot when you come back

## 2017-07-06 NOTE — Progress Notes (Signed)
Subjective  Patient is presenting with the following illnesses  Nose Bleeds Follow up None for several weeks.  No bleeding in eye (had before) or anywhere else.  No shortness of breath or lightheadness when standing. Has appointment with ENT next week  HYPERTENSION Disease Monitoring  Home BP Monitoring (Severity) her wrist cuff is inaccurate.  She plans to get a new blood pressure cuff Symptoms - Chest pain- no    Dyspnea- no Medications (Modifying factors) Compliance-  Daily all meds. Lightheadedness-  no  Edema- no Timing - continuous  Duration - years ROS - See HPI  PMH Lab Review   Potassium  Date Value Ref Range Status  06/16/2017 4.3 3.5 - 5.1 mmol/L Final  02/01/2017 5.5 (H) 3.5 - 5.1 mEq/L Final   Sodium  Date Value Ref Range Status  06/16/2017 139 135 - 145 mmol/L Final  02/01/2017 140 136 - 145 mEq/L Final   Creatinine  Date Value Ref Range Status  02/01/2017 1.6 (H) 0.6 - 1.1 mg/dL Final   Creatinine, Ser  Date Value Ref Range Status  06/16/2017 1.62 (H) 0.44 - 1.00 mg/dL Final           Chief Complaint noted Review of Symptoms - see HPI PMH - Smoking status noted.     Objective Vital Signs reviewed Ears:  External ear exam shows no significant lesions or deformities.  Otoscopic examination reveals clear canals, tympanic membranes are intact bilaterally without bulging, retraction, inflammation or discharge. Hearing is grossly normal bilaterall Eye - Pupils Equal Round Reactive to light, Extraocular movements intact,  Conjunctiva without redness or discharge Throat: normal mucosa, no exudate, uvula midline, no redness Nose:  External nasal examination shows no deformity or inflammation. Nasal mucosa are pink and moist without lesions or exudates. No septal dislocation or dislocation.No obstruction to airflow.     Assessments/Plans  No problem-specific Assessment & Plan notes found for this encounter.   See Encounter view if individual problem  A/Ps not visible See after visit summary for details of patient instuctions

## 2017-07-06 NOTE — Assessment & Plan Note (Signed)
BP Readings from Last 3 Encounters:  07/06/17 128/60  06/27/17 (!) 147/94  06/17/17 140/80   Stable.  Her blood pressure was elevated on arrival but after sitting came down to normal

## 2017-07-15 DIAGNOSIS — R040 Epistaxis: Secondary | ICD-10-CM | POA: Diagnosis not present

## 2017-07-21 LAB — HM DIABETES EYE EXAM

## 2017-08-02 ENCOUNTER — Other Ambulatory Visit: Payer: Self-pay | Admitting: Family Medicine

## 2017-08-02 DIAGNOSIS — E1169 Type 2 diabetes mellitus with other specified complication: Secondary | ICD-10-CM

## 2017-08-02 DIAGNOSIS — M21969 Unspecified acquired deformity of unspecified lower leg: Principal | ICD-10-CM

## 2017-08-02 NOTE — Progress Notes (Signed)
[facility]  [phone] [phone]  Clinic follow up Note   Patient Care Team: Lind Covert, MD as PCP - General (Family Medicine) Gevena Cotton, MD (Ophthalmology) Rana Snare, MD (Urology) Dr. Edwin Cap ([place]) Fanny Skates, MD as Consulting Physician (General Surgery) Thea Silversmith, MD (Inactive) as Consulting Physician (Radiation Oncology) Mauro Kaufmann, RN as Registered Nurse Rockwell Germany, RN as Registered Nurse Holley Bouche, NP as Nurse Practitioner (Nurse Practitioner) Truitt Merle, MD as Consulting Physician (Hematology) Sylvan Cheese, NP as Nurse Practitioner (Nurse Practitioner) Gardiner Barefoot, DPM (Podiatry) 08/03/2017  CHIEF COMPLAINTS/PURPOSE OF CONSULTATION:  Follow up breast cancer  Oncology History     Breast cancer of lower-outer quadrant of right female breast   Staging form: Breast, AJCC 7th Edition     Clinical stage from 02/19/2015: Stage IA (T1b, N0, M0) - Unsigned       Staging comments: Staged at breast conference on 3.23.16      Pathologic stage from 03/24/2015: Stage IA (T1c, N0, cM0) - Signed by Enid Cutter, MD on 03/31/2015       Staging comments: Staged on final lumpectomy specimen by Dr. Donato Heinz.      Breast cancer of lower-outer quadrant of right female breast ([place])   01/23/2015 Mammogram    Possible mass in right breast warrants further evaluation.  No findings in the left breast suspicious for malignancy      02/03/2015 Breast US    Right breast: 5 x 5 x 5 mm irregular hypoechoic mass with internal color vascularity at 6 o'clock position, 4 cm from the nipple      02/10/2015 Initial Biopsy    Right needle core bx LOQ: Invasive ductal carcinoma, grade 1, ER+ (100%), PR+ (60%), HER2/neu negative (ratio 1.13), Ki67 3%; DCIS.        02/12/2015 Breast MRI    Biopsy-proven malignancy in the central right breast at posterior depth. No MR findings to suggest  multicentric or contralateral malignancy.      02/12/2015 Clinical Stage    Stage IA: T1b N0      03/21/2015 Definitive Surgery    Right breast lumpectomy / [facility] Dalbert Batman): Invasive ductal carcinoma, grade 1, 1.1 cm, ER+ (100%), PR+ (73%), HER-2 negative (ratio 1.3), Ki67 6%, negative margins / no lymphovascular invasion; DCIS.  One lymph node negative for tumor (0/1).        03/21/2015 Oncotype testing    RS 7, low risk. (ROR 5% for 10-year recurrence with Tamoxifen alone).       03/21/2015 Pathologic Stage    Stage IA: pT1c pN0      05/07/2015 - 06/23/2015 Radiation Therapy    Adjuvant RT completed Pablo Ledger): Right breast 45 Gy over 25 fractions.  Right breast boost 16 Gy over 8 fractions. Total dose: 60 Gy.      06/02/2015 -  Anti-estrogen oral therapy    Aromasin 25 mg once daily Burr Medico).  Planned duration of therapy: 5 years.      08/15/2015 Survivorship    Survivorship visit completed and copy of survivorship care plan provided to patient.        HISTORY OF PRESENTING ILLNESS:  [name] 66 y.o. female is here because of newly diagnosed breast cancer.  This was discovered by screening mammogtram. Her last screening mammogram in February 2015 was normal.  The screening mammogram on 01/23/2015 showed a possible mass in the right breast. She underwent diagnostic mammogram and ultrasound on 02/03/2015,  which showed a 5 x 5 x 5 mm irregular mass in the right breast 6:00 position.  Your biopsy of the mass showed invasive ductal carcinoma and DCIS.   She has multiple medical problems. She has chronic arthritis, with diffuse join pain, 8/10, she takes tylenol. No chest pain, (+) dyspena on moderate exertion, no nausea, abdominal bloating or change of her bowel habits. She is able to do most of her ADLs, limited light housework, not physically active, spends quite a bit of time sitting during the daytime.  She was hospitalized for UTI in 9/15 and went to rehab for a month. She has  been using crane more regularly after that.    CURRENT THERAPY: aromasin 73m daily, started on 06/02/2015  INTERIM HISTORY:  She returns for follow up. She presents to the clinic today noting she is taking exemestane and will need a refill that she called in for.  With exemestane she experience slight hot flashes and some joint pain, which she already has arthritis.  She ambulates with can everywhere she goes.  She does regularly see her PCP.     MEDICAL HISTORY:  Past Medical History:  Diagnosis Date  . Anemia   . Arthritis   . Asthma   . Breast cancer of lower-outer quadrant of right female breast ([facility] 02/13/2015  . Cataract   . CHF, acute ([facility] 05/03/2012  . Depression   . Diabetes mellitus   . Eczema   . Fatty liver    Fatty infiltration of liver noted on 03/2012 CT scan  . Fibromyalgia   . GERD (gastroesophageal reflux disease)   . Heart disease   . History of kidney stones    w/ hx of hydronephrosis - followed by [facility]  . HIV nonspecific serology    2006: indeterminate HIV blood test, seen by ID, felt secondary to cross reacting antibodies with no further workup felt necessary at that time   . Hypertension   . Obesity   . Personal history of radiation therapy 2016   right  . Radiation 05/07/15-06/23/15   Right Breast   GYN HISTORY  Menarchal: 11 LMP: Several years ago Contraceptive: 1979-1986 HRT: NO G3P2:  SURGICAL HISTORY: Past Surgical History:  Procedure Laterality Date  . BREAST BIOPSY Left 02/10/2015   malignant   . BREAST LUMPECTOMY Right 03/21/2015  . CHOLECYSTECTOMY  2003  . CYSTOSCOPY W/ LITHOLAPAXY / [facility]    . JOINT REPLACEMENT     bilateral knee replacement  . LEFT AND RIGHT HEART CATHETERIZATION WITH CORONARY ANGIOGRAM N/A 05/02/2012   Procedure: LEFT AND RIGHT HEART CATHETERIZATION WITH CORONARY ANGIOGRAM;  Surgeon: CBurnell Blanks MD;  [facility];  Service: Cardiovascular;  Laterality: N/A;  . RADIOACTIVE SEED GUIDED  MASTECTOMY WITH AXILLARY SENTINEL LYMPH NODE BIOPSY Right 03/21/2015   Procedure: RIGHT  PARTIAL MASTECTOMY WITH RADIOACTIVE SEED LOCALIZATION RIGHT  AXILLARY SENTINEL LYMPH NODE BIOPSY;  Surgeon: HFanny Skates MD;  [place] [facility]  Service: General;  Laterality: Right;  . REPLACEMENT TOTAL KNEE BILATERAL  2005 &2006  . VASCULAR SURGERY Right 03/15/2013   Ultrasound guided sclerotherapy    SOCIAL HISTORY: History   Social History  . Marital Status: Widowed     Spouse Name: DQuillian Quince . Number of Children: 3  . Years of Education: 14   Occupational History  . retired-CNA, housekeeping    Social History Main Topics  . Smoking status: Never Smoker   . Smokeless tobacco: Never Used  .  Alcohol Use: No  . Drug Use: No  . Sexual Activity: No   Other Topics Concern  . Not on file   Social History Narrative   Health Care POA:    Emergency Contact: son, Sonja Manseau [phone]   End of Life Plan:    Who lives with you:  husband Darrall Dears- [name] is his primary care giver   Any pets: [place]   Diet: Pt has a varied diet.  Is currently working on smaller portions sizes and no late night snacking for weight loss.   Exercise: Pt exercises 1x weekly with church group.   Seatbelts: Pt reports wearing seatbelt when in vehicles.    Hobbies: word searches, church, time with family and friends             FAMILY HISTORY: Family History  Problem Relation Age of Onset  . Diabetes Mother   . Stroke Mother   . Heart disease Father   . Anemia Father   . Cancer Sister 17       nose cancer   . Cancer Cousin 30       ovarian cancer   . Cancer Cousin 8       ovarian cancer     ALLERGIES:  has No Known Allergies.  MEDICATIONS:  Current Outpatient Prescriptions  Medication Sig Dispense Refill  . dorzolamide-timolol (COSOPT) 22.3-6.8 MG/ML ophthalmic solution Place 1 drop into both eyes 2 (two) times daily.  0  . acetaminophen (TYLENOL 8 HOUR  ARTHRITIS PAIN) 650 MG CR tablet Take 650 mg by mouth every 8 (eight) hours as needed for pain.    Marland Kitchen albuterol (PROVENTIL HFA;VENTOLIN HFA) 108 (90 Base) MCG/ACT inhaler Inhale 2 puffs into the lungs every 6 (six) hours as needed for wheezing. 1 Inhaler 3  . aspirin 81 MG tablet Take 81 mg by mouth daily.    Marland Kitchen atorvastatin (LIPITOR) 40 MG tablet take 1 tablet by mouth once daily 90 tablet 3  . benazepril (LOTENSIN) 20 MG tablet TAKE 1 TABLET BY MOUTH ONCE DAILY 90 tablet 3  . colchicine 0.6 MG tablet TAKE 1 TABLET BY MOUTH ONCE DAILY 90 tablet 1  . exemestane (AROMASIN) 25 MG tablet TAKE 1 TABLET BY MOUTH ONCE A DAY AFTER BREAKFAST 90 tablet 3  . FLOVENT HFA 110 MCG/ACT inhaler INHALE 2 PUFFS INTO THE LUNGS 2 TIMES DAILY 1 Inhaler 11  . furosemide (LASIX) 20 MG tablet take 1 tablet by mouth daily 90 tablet 1  . gabapentin (NEURONTIN) 100 MG capsule TAKE 4-5 TABLETS BY MOUTH 3 TIMES DAILY 450 capsule 6  . latanoprost (XALATAN) 0.005 % ophthalmic solution Place 1 drop into both eyes 4 (four) times daily.   0  . LOTEMAX 0.5 % GEL Place 1 application into both eyes at bedtime.  0  . meloxicam (MOBIC) 15 MG tablet Take 1 tablet (15 mg total) by mouth daily. 30 tablet 0  . metFORMIN (GLUCOPHAGE) 1000 MG tablet TAKE 1 TABLET BY MOUTH 2 TIMES DAILY WITH MEALS 180 tablet 3  . metoprolol succinate (TOPROL-XL) 25 MG 24 hr tablet Take 1 tablet (25 mg total) by mouth daily. 90 tablet 3  . ranitidine (ZANTAC) 150 MG tablet Take 1 tablet (150 mg total) by mouth at bedtime as needed for heartburn. 30 tablet 11  . spironolactone (ALDACTONE) 25 MG tablet take 1 tablet by mouth once daily 90 tablet 1  . traMADol (ULTRAM) 50 MG tablet Take 1 tablet (50 mg total) by  mouth every 8 (eight) hours as needed. 30 tablet 1   No current facility-administered medications for this visit.     REVIEW OF SYSTEMS:   Constitutional: Denies fevers, chills or abnormal night sweats (+) slight hot fhashes  Eyes: Denies blurriness  of vision, double vision or watery eyes Ears, nose, mouth, throat, and face: Denies mucositis or sore throat Respiratory: Denies cough, dyspnea or wheezes Cardiovascular: Denies palpitation, chest discomfort or lower extremity swelling Gastrointestinal:  Denies nausea, heartburn or change in bowel habits Skin: Denies abnormal skin rashes Lymphatics: Denies new lymphadenopathy or easy bruising Neurological:Denies numbness, tingling or new weaknesses Behavioral/Psych: Mood is stable, no new changes  Musculoskeletal: (+) joint stiffness (+) left shoulder pain upon lifting above head All other systems were reviewed with the patient and are negative.  PHYSICAL EXAMINATION: ECOG PERFORMANCE STATUS: 2 - Symptomatic, <50% confined to bed  Vitals:   08/03/17 1115  BP: (!) 146/84  Pulse: 80  Resp: 18  Temp: 97.9 F (36.6 C)  SpO2: 100%   Filed Weights   08/03/17 1115  Weight: 233 lb 14.4 oz (106.1 kg)     GENERAL:alert, no distress and comfortable SKIN: skin color, texture, turgor are normal, no rashes or significant lesions EYES: normal, conjunctiva are pink and non-injected, sclera clear OROPHARYNX:no exudate, no erythema and lips, buccal mucosa, and tongue normal  NECK: supple, thyroid normal size, non-tender, without nodularity LYMPH:  no palpable lymphadenopathy in the cervical, axillary or inguinal (+) lower extremity edema  LUNGS: clear to auscultation and percussion with normal breathing effort HEART: regular rate & rhythm and no murmurs and no lower extremity edema ABDOMEN:abdomen soft, non-tender and normal bowel sounds Musculoskeletal:no cyanosis of digits and no clubbing  PSYCH: alert & oriented x 3 with fluent speech NEURO: no focal motor/sensory deficits Breasts: Breast inspection showed them to be symmetrical with no nipple discharge. Lumpectomy surgical scar in right breast is healing well, (+) breast deformity in the inferior right breast secondary to surgery. (+)  Diffuse skin pigmentation and mild lymphedema of the entire right breast and right axilla. Surgical wounds are healed well and there are scar tissue under the incision. Palpation of bilateral breasts feels lumpy, but no discrete mass. Palpitation of axilla revealed no obvious mass that I could appreciate.  LABORATORY DATA:  I have reviewed the data as listed  CBC Latest Ref Rng & Units 08/03/2017 06/17/2017 06/16/2017  WBC 3.9 - 10.3 10e3/uL 8.1 - 8.8  Hemoglobin 11.6 - 15.9 g/dL 11.5(L) 11.3(A) 10.8(L)  Hematocrit 34.8 - 46.6 % 37.4 - 34.1(L)  Platelets 145 - 400 10e3/uL 185 - 185   CMP Latest Ref Rng & Units 08/03/2017 06/16/2017 02/01/2017  Glucose 70 - 140 mg/dl 219(H) 299(H) 201(H)  BUN 7.0 - 26.0 mg/dL 40.1(H) 44(H) 40.3(H)  Creatinine 0.6 - 1.1 mg/dL 1.5(H) 1.62(H) 1.6(H)  Sodium 136 - 145 mEq/L 141 139 140  Potassium 3.5 - 5.1 mEq/L 4.9 4.3 5.5(H)  Chloride 101 - 111 mmol/L - 107 -  CO2 22 - 29 mEq/L 23 22 22   Calcium 8.4 - 10.4 mg/dL 9.6 9.1 9.8  Total Protein 6.4 - 8.3 g/dL 7.5 - 7.5  Total Bilirubin 0.20 - 1.20 mg/dL 0.39 - 0.59  Alkaline Phos 40 - 150 U/L 141 - 116  AST 5 - 34 U/L 16 - 15  ALT 0 - 55 U/L 12 - 16   Pathology 03/21/2015 Diagnosis 1. Breast, lumpectomy, right - INVASIVE DUCTAL CARCINOMA, SEE COMMENT. - DUCTAL CARCINOMA IN SITU. -  NEGATIVE FOR LYMPH VASCULAR INVASION. - PREVIOUS BIOPSY SITE IDENTIFIED. - SEE TUMOR SYNOPTIC TEMPLATE BELOW. 2. Lymph node, sentinel, biopsy, right axillary - ONE LYMPH NODE, NEGATIVE FOR TUMOR (0/1).  1. BREAST, INVASIVE TUMOR, WITH LYMPH NODES PRESENT Specimen, including laterality and lymph node sampling (sentinel, non-sentinel): Right breast with sentinel lymph node sampling. Procedure: Lumpectomy. Histologic type: Grade: I of III Tubule formation: 1 Nuclear pleomorphism: 2 Mitotic:1 Tumor size (gross measurement): 1.1 cm Margins: Invasive, distance to closest margin: 0.9 cm (medial). In-situ, distance to closest margin:  Same as invasive. If margin positive, focally or broadly: N/A Lymphovascular invasion: Absent. Ductal carcinoma in situ: Present. Grade: I of III Extensive intraductal component: Absent. Lobular neoplasia: Absent. Tumor focality: Unifocal. Treatment effect: None. If present, treatment effect in breast tissue, lymph nodes or both: N/A Extent of tumor: Skin: N/A Nipple: N/A Skeletal muscle: N/A 2 of 4 FINAL for GEANIE, PACIFICO ([id number]) Microscopic Comment(continued) Lymph nodes: Examined: 1 Sentinel 0 Non-sentinel 1 Total Lymph nodes with metastasis: 0 Isolated tumor cells (< 0.2 mm): N/A Micrometastasis: (> 0.2 mm and < 2.0 mm): N/A Macrometastasis: (> 2.0 mm): N/A Extracapsular extension: N/A Breast prognostic profile: Estrogen receptor: Not repeated, previous study demonstrated 100% positivity ([id number]). Progesterone receptor: Not repeated, previous study demonstrated 60% positivity ([id number]). Her 2 neu: Repeated, previous study demonstrated no amplification (1.70) ([id number]). Ki-67: Not repeated, previous study demonstrated 3% proliferation rate ([id number]). Non-neoplastic breast: Previous biopsy site related tissue changes. TNM: pT1c, pN0, pMX Results: IMMUNOHISTOCHEMICAL AND MORPHOMETRIC ANALYSIS BY THE AUTOMATED CELLULAR IMAGING SYSTEM (ACIS) Estrogen Receptor: 100%, POSITIVE, STRONG STAINING INTENSITY Progesterone Receptor: 73%, POSITIVE, STRONG STAINING INTENSITY Proliferation Marker Ki67: 6% 1. CHROMOGENIC IN-SITU HYBRIDIZATION Results: HER-2/NEU BY CISH - NEGATIVE. RESULT RATIO OF HER2: CEP 17 SIGNALS 1.30 AVERAGE HER2 COPY NUMBER PER CELL 1.95  Oncotype Dx: RS 7, predicts 5% 10-year risk of distant recurrence with tamoxifen alone, low risk   RADIOGRAPHIC STUDIES: I have personally reviewed the radiological images as listed and agreed with the findings in the report.  01/27/2017 Mammogram  IMPRESSION: Stable right breast lumpectomy site. No  mammographic evidence of malignancy in the bilateral breasts.  Bone density scan 09/03/2015 ASSESSMENT: The BMD measured at Forearm Radius 33% is 0.925 g/cm2 with a T-score of 0.4. This patient is considered NORMAL according to [facility]) criteria.Lumbar spine was not utilized due to advanced degenerative changes.  ASSESSMENT & PLAN:  66 y.o. postmenopausal African-American American female, screening detected right breast invasive ductal carcinoma and DCIS.  1. Breast cancer of lower-outer quadrant of right breast, invasive ductal carcinoma, pT1c N0 M0, stage IA, ER positive PR positive HER-2 negative, Ki 67 6%, and DCIS -I previously discussed her imaging findings and surgical pathology results with her and her daughters in great details. -She has early-stage breast cancer, high possibility of cured by the complete surgical resection. -Reviewed the possibility of local and distant recurrence after surgery, although it's low, we recommend adjuvant radiation and endocrine therapy to reduce her risk. -Her Oncotype was low risk, no need adjuvant chemotherapy -She has started Aromasin, tolerating well so far, we'll continue, plan for total 5 years. -She is clinically doing well. Her 01/27/17 mammogram was normal.Her breast exam and lab are unremarkable except her glucose is elevated at 219 and liver enzymes are stable. She shows no clinical concerns of recurrence. -Continue breast cancer surveillance with annual mammogram, self and physician breast exam, I encouraged her to have healthy diet and to be physically active -Next mammogram in  12/2017  2. Bone health -I again discussed the side effect of osteopenia and osteoporosis from Aromasin - I encouraged her to continue calcium and vitamin D supplement  -Her bone density scan in 08/2015 was normal. We will continue monitoring every 2 years -Next bone density scan 12/2017  3. Hypertension, diabetes, arthritis, CHF -She will  continue follow-up with her primary care physician  4. CKD stage III  -Her GFR is in 40s, stable overall, continue monitoring -To avoid nephrotoxin  -She follows up with nephrology, kidney function overall stable.   5. Hyperkalemia -Her K has been slightly elevated, she is on KCL oral 31mq daily and spur lactone -I recommend her to stop potassium until she sees her PCP  -Her potassium is 4.9 today (08/03/17), her hyperkalemia is currently resolved.   6. Anemia -She has mild chronic normocytic anemia, Probably secondary to her CKD -Her prior study in 2014 showed elevated ferritin, normal iron -Overall stable, hemoglobin 11.5 today, we'll continue monitoring.   Plan -Refill Exemestane today  -Mammogram and DEXA IN 01/2018 -F/u IN 6 Months -Continue Aromasin, tolerating well with minimal manageable side effects.   All questions were answered. The patient knows to call the clinic with any problems, questions or concerns.  I spent 20 minutes counseling the patient face to face. The total time spent in the appointment was 25 minutes and more than 50% was on counseling.  This document serves as a record of services personally performed by YTruitt Merle MD. It was created on her behalf by AJoslyn Devon a trained medical scribe. The creation of this record is based on the scribe's personal observations and the provider's statements to them. This document has been checked and approved by the attending provider.   I have reviewed the above documentation for accuracy and completeness and I agree with the above.   FTruitt Merle MD 08/03/2017

## 2017-08-03 ENCOUNTER — Telehealth: Payer: Self-pay | Admitting: Hematology

## 2017-08-03 ENCOUNTER — Other Ambulatory Visit (HOSPITAL_BASED_OUTPATIENT_CLINIC_OR_DEPARTMENT_OTHER): Payer: Medicare Other

## 2017-08-03 ENCOUNTER — Ambulatory Visit (HOSPITAL_BASED_OUTPATIENT_CLINIC_OR_DEPARTMENT_OTHER): Payer: Medicare Other | Admitting: Hematology

## 2017-08-03 VITALS — BP 146/84 | HR 80 | Temp 97.9°F | Resp 18 | Ht 62.0 in | Wt 233.9 lb

## 2017-08-03 DIAGNOSIS — I10 Essential (primary) hypertension: Secondary | ICD-10-CM

## 2017-08-03 DIAGNOSIS — E2839 Other primary ovarian failure: Secondary | ICD-10-CM

## 2017-08-03 DIAGNOSIS — C50511 Malignant neoplasm of lower-outer quadrant of right female breast: Secondary | ICD-10-CM | POA: Diagnosis not present

## 2017-08-03 DIAGNOSIS — N183 Chronic kidney disease, stage 3 (moderate): Secondary | ICD-10-CM

## 2017-08-03 DIAGNOSIS — Z170 Estrogen receptor positive status [ER+]: Secondary | ICD-10-CM | POA: Diagnosis not present

## 2017-08-03 DIAGNOSIS — Z79811 Long term (current) use of aromatase inhibitors: Secondary | ICD-10-CM | POA: Diagnosis not present

## 2017-08-03 DIAGNOSIS — E875 Hyperkalemia: Secondary | ICD-10-CM | POA: Diagnosis not present

## 2017-08-03 DIAGNOSIS — I5042 Chronic combined systolic (congestive) and diastolic (congestive) heart failure: Secondary | ICD-10-CM

## 2017-08-03 LAB — COMPREHENSIVE METABOLIC PANEL
ALT: 12 U/L (ref 0–55)
ANION GAP: 11 meq/L (ref 3–11)
AST: 16 U/L (ref 5–34)
Albumin: 3.8 g/dL (ref 3.5–5.0)
Alkaline Phosphatase: 141 U/L (ref 40–150)
BUN: 40.1 mg/dL — ABNORMAL HIGH (ref 7.0–26.0)
CALCIUM: 9.6 mg/dL (ref 8.4–10.4)
CHLORIDE: 106 meq/L (ref 98–109)
CO2: 23 meq/L (ref 22–29)
Creatinine: 1.5 mg/dL — ABNORMAL HIGH (ref 0.6–1.1)
EGFR: 41 mL/min/{1.73_m2} — ABNORMAL LOW (ref >90)
Glucose: 219 mg/dl — ABNORMAL HIGH (ref 70–140)
POTASSIUM: 4.9 meq/L (ref 3.5–5.1)
Sodium: 141 mEq/L (ref 136–145)
Total Bilirubin: 0.39 mg/dL (ref 0.20–1.20)
Total Protein: 7.5 g/dL (ref 6.4–8.3)

## 2017-08-03 LAB — CBC WITH DIFFERENTIAL/PLATELET
BASO%: 0.4 % (ref 0.0–2.0)
BASOS ABS: 0 10*3/uL (ref 0.0–0.1)
EOS%: 3 % (ref 0.0–7.0)
Eosinophils Absolute: 0.2 10*3/uL (ref 0.0–0.5)
HEMATOCRIT: 37.4 % (ref 34.8–46.6)
HGB: 11.5 g/dL — ABNORMAL LOW (ref 11.6–15.9)
LYMPH#: 2.8 10*3/uL (ref 0.9–3.3)
LYMPH%: 35.2 % (ref 14.0–49.7)
MCH: 29.9 pg (ref 25.1–34.0)
MCHC: 30.7 g/dL — ABNORMAL LOW (ref 31.5–36.0)
MCV: 97.1 fL (ref 79.5–101.0)
MONO#: 0.7 10*3/uL (ref 0.1–0.9)
MONO%: 8.7 % (ref 0.0–14.0)
NEUT#: 4.3 10*3/uL (ref 1.5–6.5)
NEUT%: 52.7 % (ref 38.4–76.8)
PLATELETS: 185 10*3/uL (ref 145–400)
RBC: 3.85 10*6/uL (ref 3.70–5.45)
RDW: 13.8 % (ref 11.2–14.5)
WBC: 8.1 10*3/uL (ref 3.9–10.3)

## 2017-08-03 MED ORDER — EXEMESTANE 25 MG PO TABS: ORAL_TABLET | ORAL | 3 refills | Status: DC

## 2017-08-03 NOTE — Telephone Encounter (Signed)
Gave patient AVS and calendar of upcoming March appointments.  °

## 2017-08-06 ENCOUNTER — Encounter: Payer: Self-pay | Admitting: Hematology

## 2017-08-10 ENCOUNTER — Other Ambulatory Visit: Payer: Self-pay | Admitting: Family Medicine

## 2017-08-10 DIAGNOSIS — E785 Hyperlipidemia, unspecified: Secondary | ICD-10-CM

## 2017-08-30 ENCOUNTER — Other Ambulatory Visit: Payer: Self-pay | Admitting: Family Medicine

## 2017-09-02 ENCOUNTER — Telehealth: Payer: Self-pay | Admitting: Family Medicine

## 2017-09-02 NOTE — Telephone Encounter (Signed)
Having diarrhea about a week several times a day to many times Trying pepto and imodium which only helps a little No nausea and vomiting or bleeding or lightheadness or abdomen pain or fever Able to keep down liquids  Suggested  Continue fluids Try imodium up to three times a day If still symptoms call on Monday If any warning symptosm above go to ER  She agrees

## 2017-09-14 ENCOUNTER — Encounter: Payer: Self-pay | Admitting: Podiatry

## 2017-09-14 ENCOUNTER — Ambulatory Visit (INDEPENDENT_AMBULATORY_CARE_PROVIDER_SITE_OTHER): Payer: Medicare Other | Admitting: Podiatry

## 2017-09-14 DIAGNOSIS — E1140 Type 2 diabetes mellitus with diabetic neuropathy, unspecified: Secondary | ICD-10-CM

## 2017-09-14 DIAGNOSIS — B351 Tinea unguium: Secondary | ICD-10-CM | POA: Diagnosis not present

## 2017-09-14 DIAGNOSIS — M2010 Hallux valgus (acquired), unspecified foot: Secondary | ICD-10-CM

## 2017-09-14 MED ORDER — MELOXICAM 15 MG PO TABS: 15.0000 mg | ORAL_TABLET | Freq: Every day | ORAL | 0 refills | Status: DC

## 2017-09-14 NOTE — Addendum Note (Signed)
Addended byDeidre Ala, Salman Wellen L on: 09/14/2017 09:58 AM   Modules accepted: Orders

## 2017-09-14 NOTE — Progress Notes (Signed)
Patient ID: [name], female   DOB: February 21, 1951, 66 y.o.   MRN: [id number] Complaint:  Visit Type: Patient returns to my office for continued preventative foot care services. Complaint: Patient states" my nails have grown long and thick and become painful to walk and wear shoes" Patient has been diagnosed with DM with no complications. He presents for preventative foot care services. No changes to ROS.  No heel pain.  Patient says she has left forefoot pain on and off during the day.  Podiatric Exam: Vascular: dorsalis pedis and posterior tibial pulses are palpable bilateral. Capillary return is immediate. Temperature gradient is WNL. Skin turgor WNL  Sensorium: Normal Semmes Weinstein monofilament test. Normal tactile sensation bilaterally. Nail Exam: Pt has thick disfigured discolored nails second and third toenails left foot. Ulcer Exam: There is no evidence of ulcer or pre-ulcerative changes or infection. Orthopedic Exam: Muscle tone and strength are WNL. No limitations in general ROM. No crepitus or effusions noted. Foot type and digits show no abnormalities. . Asymptomatic HAV B/L.  No redness or swelling or limited ROM left forefoot. Skin: No Porokeratosis. No infection or ulcers  Diagnosis:  Tinea unguium, , pain in left toes  Treatment & Plan Procedures and Treatment: Consent by patient was obtained for treatment procedures. The patient understood the discussion of treatment and procedures well. All questions were answered thoroughly reviewed. Debridement of mycotic and hypertrophic toenails, 1 through 5 bilateral and clearing of subungual debris. No ulceration, no infection noted.  Patient may have arthritic pain due to the severity of her HAV deformity.  Therefore since the Mobic helped her heel,  I suggested Mobic to evaluate if it helps. Return Visit-Office Procedure: Patient instructed to return to the office for a follow up visit 3 months for continued evaluation and  treatment.   Gardiner Barefoot DPM

## 2017-10-04 ENCOUNTER — Ambulatory Visit: Payer: Medicare Other | Admitting: Hematology

## 2017-10-04 ENCOUNTER — Other Ambulatory Visit: Payer: Medicare Other

## 2017-10-05 ENCOUNTER — Other Ambulatory Visit: Payer: Self-pay

## 2017-10-05 ENCOUNTER — Ambulatory Visit (INDEPENDENT_AMBULATORY_CARE_PROVIDER_SITE_OTHER): Payer: Medicare Other | Admitting: Family Medicine

## 2017-10-05 ENCOUNTER — Encounter: Payer: Self-pay | Admitting: Family Medicine

## 2017-10-05 VITALS — BP 170/88 | HR 73 | Temp 97.6°F | Ht 62.0 in | Wt 238.4 lb

## 2017-10-05 DIAGNOSIS — M21969 Unspecified acquired deformity of unspecified lower leg: Secondary | ICD-10-CM

## 2017-10-05 DIAGNOSIS — I10 Essential (primary) hypertension: Secondary | ICD-10-CM | POA: Diagnosis not present

## 2017-10-05 DIAGNOSIS — E1169 Type 2 diabetes mellitus with other specified complication: Secondary | ICD-10-CM | POA: Diagnosis not present

## 2017-10-05 DIAGNOSIS — Z23 Encounter for immunization: Secondary | ICD-10-CM | POA: Diagnosis not present

## 2017-10-05 DIAGNOSIS — E782 Mixed hyperlipidemia: Secondary | ICD-10-CM | POA: Diagnosis not present

## 2017-10-05 LAB — POCT GLYCOSYLATED HEMOGLOBIN (HGB A1C): HEMOGLOBIN A1C: 7.5

## 2017-10-05 NOTE — Progress Notes (Signed)
Subjective  Patient is presenting with the following illnesses  FREQUENT BMs For the last 6 weeks or so on and off will have urgency and up to 4-5 bowel movement some loose some formed a day.  No blood not abdomen pain no fever no sick contacts. No new medications, Colonoscopy in 2012  HYPERTENSION Disease Monitoring: Blood pressure range-130-170s/70-90 over last month Chest pain, palpitations- no      Dyspnea- no Medications: Compliance- brings all meds Lightheadedness,Syncope- no   Edema- usual amount  DIABETES Disease Monitoring: Blood Sugar ranges-not checking Polyuria/phagia/dipsia- no      Visual problems- no Medications: Compliance- daily Hypoglycemic symptoms- no  HYPERLIPIDEMIA Disease Monitoring: See symptoms for Hypertension Medications: Compliance- daily atorvastatin Right upper quadrant pain- no  Muscle aches- no  Monitoring Labs and Parameters Last A1C:  Lab Results  Component Value Date   HGBA1C 7.5 10/05/2017    Last Lipid:     Component Value Date/Time   CHOL 229 (H) 01/07/2016 1635   HDL 39 (L) 01/07/2016 1635   LDLDIRECT 36 03/31/2016 1027    Last Bmet  Potassium  Date Value Ref Range Status  08/03/2017 4.9 3.5 - 5.1 mEq/L Final   Sodium  Date Value Ref Range Status  08/03/2017 141 136 - 145 mEq/L Final   Creatinine  Date Value Ref Range Status  08/03/2017 1.5 (H) 0.6 - 1.1 mg/dL Final      Last BPs:  BP Readings from Last 3 Encounters:  10/05/17 (!) 170/88  08/03/17 (!) 146/84  07/06/17 128/60         Chief Complaint noted Review of Symptoms - see HPI PMH - Smoking status noted.     Objective Vital Signs reviewed Heart - Regular rate and rhythm.  No murmurs, gallops or rubs.    Lungs:  Normal respiratory effort, chest expands symmetrically. Lungs are clear to auscultation, no crackles or wheezes. Extrem - trace edema wearing stockings Diabetic Foot Check -  Appearance - no lesions, ulcers or calluses Skin - no unusual  pallor or redness Monofilament testing -  Right - Great toe, medial, central, lateral ball and posterior foot decreased Left - Great toe, medial, central, lateral ball and posterior foot decreased     Assessments/Plans  Loose BM - cut out ranitidine and lower metformin and follow up in one month  Essential hypertension Not at goal but home readings fluctuate widely and she is at increased fall risk with gait instability so will monitor for now.  Come back in one month  Type 2 diabetes mellitus with diabetic foot deformity Reasonable control.  Decrease metformin to 500 twice a day due to loose bowel movement and mild creat increase   Hyperlipidemia Stable     See after visit summary for details of patient instuctions

## 2017-10-05 NOTE — Addendum Note (Signed)
Addended by: Junious Dresser on: 10/05/2017 10:10 AM   Modules accepted: Orders

## 2017-10-05 NOTE — Assessment & Plan Note (Signed)
Not at goal but home readings fluctuate widely and she is at increased fall risk with gait instability so will monitor for now.  Come back in one month

## 2017-10-05 NOTE — Assessment & Plan Note (Signed)
Stable

## 2017-10-05 NOTE — Assessment & Plan Note (Signed)
Reasonable control.  Decrease metformin to 500 twice a day due to loose bowel movement and mild creat increase

## 2017-10-05 NOTE — Patient Instructions (Addendum)
Good to see you today!  Thanks for coming in.  Take 1/2 metformin twice a day  Stop the ranitidine 150 mg daily  Come back in 1 month and bring your blood pressure readings to check your blood pressure and your diarrhea

## 2017-10-10 DIAGNOSIS — N200 Calculus of kidney: Secondary | ICD-10-CM | POA: Diagnosis not present

## 2017-10-10 DIAGNOSIS — N3020 Other chronic cystitis without hematuria: Secondary | ICD-10-CM | POA: Diagnosis not present

## 2017-10-18 DIAGNOSIS — H401133 Primary open-angle glaucoma, bilateral, severe stage: Secondary | ICD-10-CM | POA: Diagnosis not present

## 2017-11-02 ENCOUNTER — Ambulatory Visit (INDEPENDENT_AMBULATORY_CARE_PROVIDER_SITE_OTHER): Payer: Medicare Other | Admitting: Family Medicine

## 2017-11-02 ENCOUNTER — Encounter: Payer: Self-pay | Admitting: Family Medicine

## 2017-11-02 ENCOUNTER — Other Ambulatory Visit: Payer: Self-pay

## 2017-11-02 DIAGNOSIS — I10 Essential (primary) hypertension: Secondary | ICD-10-CM

## 2017-11-02 DIAGNOSIS — R194 Change in bowel habit: Secondary | ICD-10-CM | POA: Diagnosis not present

## 2017-11-02 MED ORDER — GABAPENTIN 100 MG PO CAPS: ORAL_CAPSULE | ORAL | 6 refills | Status: DC

## 2017-11-02 NOTE — Patient Instructions (Addendum)
Good to see you today!  Thanks for coming in.  Frequent BMs Start metamucil or fibercon (fiber) once a day - should make your stools more formed and not as frequent  For BP Take two metoprolol tabs every day Call and give your blood pressure readings and pulse in one week for the last 4-5 days Call if this makes you light headed or your pulse is < 50 then call me   Come back in Feb

## 2017-11-02 NOTE — Progress Notes (Signed)
Subjective  Patient is presenting with the following illnesses  FREQUENT BMs Continues unchanged - 5-6 somewhat formed stools per day.  No change with decreased metformin and stopping ranitidine  No blood not abdomen pain no fever no sick contacts. No new medications, Colonoscopy in 2012  HYPERTENSION Disease Monitoring: Blood pressure range-130-170s/70-90 with pulses in 70-80s readings at home over last month Chest pain, palpitations- no      Dyspnea- no Medications: Compliance- brings all meds Lightheadedness,Syncope- no   Edema- usual amount  Chief Complaint noted Review of Symptoms - see HPI PMH - Smoking status noted.     Objective Vital Signs reviewed Had to go to bathroom during the visit    Assessments/Plans  Essential hypertension BP Readings from Last 3 Encounters:  11/02/17 (!) 148/84  10/05/17 (!) 170/88  08/03/17 (!) 146/84   Not at goal - since her pulse is good will increase her metoprolol and have her follow at home - see after visit summary   Frequent bowel movements No obvious cause.   Had colonoscopy in 2012.  Did not respond to decreasing metformin.  She does not want to stop colchicine.  Will try bulking fiber agents.  If persists may need GI referral for colonoscopy    See after visit summary for details of patient instuctions

## 2017-11-02 NOTE — Assessment & Plan Note (Signed)
BP Readings from Last 3 Encounters:  11/02/17 (!) 148/84  10/05/17 (!) 170/88  08/03/17 (!) 146/84   Not at goal - since her pulse is good will increase her metoprolol and have her follow at home - see after visit summary

## 2017-11-02 NOTE — Assessment & Plan Note (Addendum)
No improvement. No obvious cause.   Had colonoscopy in 2012.  Did not respond to decreasing metformin.  She does not want to stop colchicine.  Will try bulking fiber agents.  If persists may need GI referral for colonoscopy

## 2017-11-11 ENCOUNTER — Telehealth: Payer: Self-pay | Admitting: *Deleted

## 2017-11-11 NOTE — Telephone Encounter (Signed)
Pt calls to report the following BPs:  Pt did increase metoprolol to two pills and checked BP before breakfast:  12/10 @ 9:42: 138/85 P: 74 12/11 @ 10:12: 149/89 P: 77 12/12 @ 9:45: 162/86 P: 76 12/13 @ 10:05: 143/91 P: 74 12/14 @ 9:42: 151/91 P: 41( asymptomatic)  Will forward to MD. Dionisio Aragones, Salome Spotted, [place]

## 2017-11-14 ENCOUNTER — Other Ambulatory Visit: Payer: Self-pay | Admitting: Family Medicine

## 2017-11-14 DIAGNOSIS — E785 Hyperlipidemia, unspecified: Secondary | ICD-10-CM

## 2017-11-14 MED ORDER — FLUTICASONE PROPIONATE HFA 110 MCG/ACT IN AERO: INHALATION_SPRAY | RESPIRATORY_TRACT | 11 refills | Status: DC

## 2017-11-14 MED ORDER — FUROSEMIDE 20 MG PO TABS: 20.0000 mg | ORAL_TABLET | Freq: Every day | ORAL | 1 refills | Status: DC

## 2017-11-14 MED ORDER — BENAZEPRIL HCL 20 MG PO TABS: 20.0000 mg | ORAL_TABLET | Freq: Every day | ORAL | 2 refills | Status: DC

## 2017-11-14 MED ORDER — ATORVASTATIN CALCIUM 40 MG PO TABS: 40.0000 mg | ORAL_TABLET | Freq: Every day | ORAL | 3 refills | Status: DC

## 2017-11-14 NOTE — Telephone Encounter (Signed)
Readings Sat 153/75 41 Sun 134/71 31 Mon 146/85 78  Feels well no lightheadness or chest pain or near syncope  I suspect her pulse is higher than her monitor reads given she is sure she is taking her medications regularly and she is asymptomatic   Continue  Monitoring.  If any symptoms call us  She agrees

## 2017-11-14 NOTE — Telephone Encounter (Signed)
Pt is about out of her medications, she requested refills about a week ago.  She needs furosemide, benadryl, atorvastatin, flovent.  She uses [facility] on [address]

## 2017-12-21 ENCOUNTER — Ambulatory Visit (INDEPENDENT_AMBULATORY_CARE_PROVIDER_SITE_OTHER): Payer: Medicare Other | Admitting: Podiatry

## 2017-12-21 ENCOUNTER — Encounter: Payer: Self-pay | Admitting: Podiatry

## 2017-12-21 DIAGNOSIS — M2010 Hallux valgus (acquired), unspecified foot: Secondary | ICD-10-CM

## 2017-12-21 DIAGNOSIS — E1140 Type 2 diabetes mellitus with diabetic neuropathy, unspecified: Secondary | ICD-10-CM

## 2017-12-21 DIAGNOSIS — B351 Tinea unguium: Secondary | ICD-10-CM

## 2017-12-21 NOTE — Progress Notes (Signed)
Patient ID: [name], female   DOB: 07/23/1951, 67 y.o.   MRN: [id number] Complaint:  Visit Type: Patient returns to the office for preventative foot care services.  Patient says the diabetic shoes help her feet.    She says she is still in pain  In both feet.  Podiatric Exam: Vascular: dorsalis pedis and posterior tibial pulses are palpable bilateral. Capillary return is immediate. Temperature gradient is WNL. Skin turgor WNL  Sensorium: Normal Semmes Weinstein monofilament test. Normal tactile sensation bilaterally. Nail Exam: Pt has thick disfigured discolored nails second and third toenails left foot. Ulcer Exam: There is no evidence of ulcer or pre-ulcerative changes or infection. Orthopedic Exam: Muscle tone and strength are WNL. No limitations in general ROM. No crepitus or effusions noted. Foot type and digits show no abnormalities. . Asymptomatic HAV B/L.  Pes planus noted. Skin: No Porokeratosis. No infection or ulcers  Diagnosis:  Onychomycosis  Diabetes    Treatment & Plan Procedures and Treatment: Debridement and grinding of long painful nails both feet.Marland Kitchen Marland KitchenRTC 3 months. Return Visit-Office Procedure: Patient instructed to return to the office for a follow up visit for scheduled preventive foot care services.   Gardiner Barefoot DPM

## 2017-12-30 ENCOUNTER — Encounter: Payer: Self-pay | Admitting: Family Medicine

## 2018-01-03 ENCOUNTER — Emergency Department (HOSPITAL_COMMUNITY)
Admission: EM | Admit: 2018-01-03 | Discharge: 2018-01-03 | Disposition: A | Discharge status: Home / Self Care | Payer: Medicare Other | Attending: Emergency Medicine | Admitting: Emergency Medicine

## 2018-01-03 ENCOUNTER — Other Ambulatory Visit: Payer: Self-pay

## 2018-01-03 ENCOUNTER — Encounter (HOSPITAL_COMMUNITY): Payer: Self-pay

## 2018-01-03 ENCOUNTER — Encounter: Payer: Self-pay | Admitting: Family Medicine

## 2018-01-03 ENCOUNTER — Ambulatory Visit (INDEPENDENT_AMBULATORY_CARE_PROVIDER_SITE_OTHER): Payer: Medicare Other | Admitting: Family Medicine

## 2018-01-03 VITALS — BP 136/78 | HR 75 | Temp 97.6°F | Ht 62.0 in | Wt 229.0 lb

## 2018-01-03 DIAGNOSIS — E1169 Type 2 diabetes mellitus with other specified complication: Secondary | ICD-10-CM

## 2018-01-03 DIAGNOSIS — R262 Difficulty in walking, not elsewhere classified: Secondary | ICD-10-CM

## 2018-01-03 DIAGNOSIS — M21969 Unspecified acquired deformity of unspecified lower leg: Secondary | ICD-10-CM

## 2018-01-03 DIAGNOSIS — R2681 Unsteadiness on feet: Secondary | ICD-10-CM | POA: Insufficient documentation

## 2018-01-03 DIAGNOSIS — R2689 Other abnormalities of gait and mobility: Secondary | ICD-10-CM | POA: Insufficient documentation

## 2018-01-03 DIAGNOSIS — Z5321 Procedure and treatment not carried out due to patient leaving prior to being seen by health care provider: Secondary | ICD-10-CM | POA: Diagnosis not present

## 2018-01-03 LAB — POCT GLYCOSYLATED HEMOGLOBIN (HGB A1C): Hemoglobin A1C: 7.7

## 2018-01-03 NOTE — Assessment & Plan Note (Signed)
New but going on for a few weeks.  No specific focal deficit on neuro exam.  Has history of intermittent stuttering and has been evaluated by neurology before (see notes)  I am concerned with a systemic cause (anemia, muscle disease (pain and hand edema) semi-remote cerebellar CVA.  Will send to ER since no beds available at [facility]

## 2018-01-03 NOTE — ED Triage Notes (Signed)
Pt seen PCP yesterday for worsening gait instability that has been going on for the past year.  Pt and daughter thinks it is getting worse.  C/o left shoulder, bilateral hands, left hip pain x 1 week. No known injury.

## 2018-01-03 NOTE — Progress Notes (Signed)
Subjective  Patient is presenting with the following illnesses  INABILITY TO WALK She feels has been going on for about a month.  Maybe R side is weaker but not sure.  Had to be helped to get to room in [facility].  Arrived using her care.   No recent falls or injury.  No change in vision.  Continues to have intermittent stuttering episodes.  She feels her hands are swollen and achy more than usual.  No change in medications or chest pain.  Continues to have frequent semiformed bowel movements.     Chief Complaint noted Review of Symptoms - see HPI PMH - Smoking status noted.     Objective Vital Signs reviewed Alert oriented knows her medications Short episodes of stuttering PERRL Able to stand pushing off from [facility] but falls backwards frequently.  With coaxing can stand but shuffles with magnetic type gait. Strength - seems 4-5/5 both L and R upper and lower extrem but has pain with movement Finger to Nose normal bilaterally  Hands - both are diffusely swollen   Assessments/Plans  Inability to walk New but going on for a few weeks.  No specific focal deficit on neuro exam.  Has history of intermittent stuttering and has been evaluated by neurology before (see notes)  I am concerned with a systemic cause (anemia, muscle disease (pain and hand edema) semi-remote cerebellar CVA.  Will send to ER since no beds available at [facility]    See after visit summary for details of patient instuctions

## 2018-01-03 NOTE — ED Notes (Signed)
Pt was told not to get up and walk in triage because of her unsteady gait. Pt up in waiting room trying to walk to the bathroom. Pt almost falls and is told again to stay in her wheelchair.

## 2018-01-03 NOTE — ED Notes (Signed)
Pt seen leaving, states she does not want to wait any longer.

## 2018-01-04 ENCOUNTER — Encounter: Payer: Self-pay | Admitting: Family Medicine

## 2018-01-04 ENCOUNTER — Telehealth: Payer: Self-pay | Admitting: Family Medicine

## 2018-01-04 DIAGNOSIS — G3281 Cerebellar ataxia in diseases classified elsewhere: Secondary | ICD-10-CM

## 2018-01-04 DIAGNOSIS — R262 Difficulty in walking, not elsewhere classified: Secondary | ICD-10-CM

## 2018-01-04 NOTE — Telephone Encounter (Signed)
Patient called last night.  Spent 7 hours in ER and was not seen so left with her daughter.     I will try to set up otpt work up.  Asked her to call 911 if any worsening or new neuro symptoms.  She agrees

## 2018-01-05 NOTE — Assessment & Plan Note (Signed)
Patient seen on Tuesday - sent to ER waited 7 hours was not seen and left. See subsequent phone notes.  Her inability to walk seems to have gradually progressed over several weeks.  Her lack of concern about this is puzzling.  She has unexplained episodes of shaking and stuttering and has been seen by neurology several times without a specific diagnosis.   I wonder if her gait instability is related.   She could have had a cerebellar stroke or this could be more muscle weakness related.  Will check labs and MRI as an outpatient since I do not think this is an acute reversible process.   Discussed with her via phone and she agrees

## 2018-01-05 NOTE — Telephone Encounter (Signed)
Pt informed of MRI and lab appt. Plumer Mittelstaedt Kennon Holter, CMA

## 2018-01-05 NOTE — Telephone Encounter (Signed)
Spoke with her She is on way to Dentist Using walker but feels balance/weakness is still about the same  Recommend Come in for blood tests at the [facility] I will order an MRI  She agrees   Staff Please schedule MRI as ordered Thanks

## 2018-01-06 ENCOUNTER — Other Ambulatory Visit: Payer: Medicare Other

## 2018-01-06 DIAGNOSIS — R262 Difficulty in walking, not elsewhere classified: Secondary | ICD-10-CM | POA: Diagnosis not present

## 2018-01-06 DIAGNOSIS — E1169 Type 2 diabetes mellitus with other specified complication: Secondary | ICD-10-CM | POA: Diagnosis not present

## 2018-01-07 LAB — COMPREHENSIVE METABOLIC PANEL
A/G RATIO: 1.6 (ref 1.2–2.2)
ALT: 8 IU/L (ref 0–32)
AST: 14 IU/L (ref 0–40)
Albumin: 4.2 g/dL (ref 3.6–4.8)
Alkaline Phosphatase: 116 IU/L (ref 39–117)
BILIRUBIN TOTAL: 0.4 mg/dL (ref 0.0–1.2)
BUN / CREAT RATIO: 19 (ref 12–28)
BUN: 18 mg/dL (ref 8–27)
CHLORIDE: 106 mmol/L (ref 96–106)
CO2: 19 mmol/L — ABNORMAL LOW (ref 20–29)
Calcium: 9 mg/dL (ref 8.7–10.3)
Creatinine, Ser: 0.95 mg/dL (ref 0.57–1.00)
GFR calc non Af Amer: 62 mL/min/{1.73_m2} (ref >59)
GFR, EST AFRICAN AMERICAN: 71 mL/min/{1.73_m2} (ref >59)
Globulin, Total: 2.7 g/dL (ref 1.5–4.5)
Glucose: 169 mg/dL — ABNORMAL HIGH (ref 65–99)
POTASSIUM: 4.2 mmol/L (ref 3.5–5.2)
Sodium: 143 mmol/L (ref 134–144)
TOTAL PROTEIN: 6.9 g/dL (ref 6.0–8.5)

## 2018-01-07 LAB — VITAMIN B12: Vitamin B-12: 249 pg/mL (ref 232–1245)

## 2018-01-07 LAB — FOLATE: FOLATE: 11.3 ng/mL (ref >3.0)

## 2018-01-07 LAB — CK: CK TOTAL: 99 U/L (ref 24–173)

## 2018-01-07 LAB — SEDIMENTATION RATE: Sed Rate: 39 mm/hr (ref 0–40)

## 2018-01-07 LAB — TSH: TSH: 1.21 u[IU]/mL (ref 0.450–4.500)

## 2018-01-13 ENCOUNTER — Ambulatory Visit (HOSPITAL_COMMUNITY)
Admission: RE | Admit: 2018-01-13 | Discharge: 2018-01-13 | Disposition: A | Discharge status: Home / Self Care | Payer: Medicare Other | Source: Ambulatory Visit | Attending: Family Medicine | Admitting: Family Medicine

## 2018-01-13 DIAGNOSIS — G3281 Cerebellar ataxia in diseases classified elsewhere: Secondary | ICD-10-CM | POA: Diagnosis not present

## 2018-01-13 DIAGNOSIS — G110 Congenital nonprogressive ataxia: Secondary | ICD-10-CM | POA: Diagnosis not present

## 2018-01-13 DIAGNOSIS — R262 Difficulty in walking, not elsewhere classified: Secondary | ICD-10-CM

## 2018-01-13 MED ORDER — GADOBENATE DIMEGLUMINE 529 MG/ML IV SOLN
20.0000 mL | Freq: Once | INTRAVENOUS | Status: AC | PRN
  Administered 2018-01-13: 20 mL via INTRAVENOUS

## 2018-01-17 ENCOUNTER — Telehealth: Payer: Self-pay

## 2018-01-17 DIAGNOSIS — R262 Difficulty in walking, not elsewhere classified: Secondary | ICD-10-CM

## 2018-01-17 NOTE — Telephone Encounter (Signed)
Patient left message on nurse line requesting MRI results. Danley Danker, RN [facility] RN)

## 2018-01-18 NOTE — Telephone Encounter (Signed)
  Called no answer Left VM to call back to discuss

## 2018-01-20 NOTE — Telephone Encounter (Signed)
Patient left message on nurse line that she is returning call to PCP. Danley Danker, RN [facility] RN)

## 2018-01-23 NOTE — Telephone Encounter (Signed)
Patient left another message requesting results. Danley Danker, RN [facility] RN)

## 2018-01-28 ENCOUNTER — Other Ambulatory Visit: Payer: Self-pay | Admitting: Family Medicine

## 2018-01-30 NOTE — Telephone Encounter (Signed)
Feeling about the same Still using walker to ambulate but feels is almost ready to go back to cane  Will refer to Physical Therapy since work up as been negative and patient has been seen by Neurology without specific recommendations   Asked her to call if she does not here from them.

## 2018-01-31 ENCOUNTER — Ambulatory Visit
Admission: RE | Admit: 2018-01-31 | Discharge: 2018-01-31 | Disposition: A | Discharge status: Home / Self Care | Payer: Medicare Other | Source: Ambulatory Visit | Attending: Hematology | Admitting: Hematology

## 2018-01-31 DIAGNOSIS — E2839 Other primary ovarian failure: Secondary | ICD-10-CM

## 2018-01-31 DIAGNOSIS — Z780 Asymptomatic menopausal state: Secondary | ICD-10-CM | POA: Diagnosis not present

## 2018-01-31 DIAGNOSIS — R928 Other abnormal and inconclusive findings on diagnostic imaging of breast: Secondary | ICD-10-CM | POA: Diagnosis not present

## 2018-01-31 DIAGNOSIS — C50511 Malignant neoplasm of lower-outer quadrant of right female breast: Secondary | ICD-10-CM

## 2018-01-31 DIAGNOSIS — Z13820 Encounter for screening for osteoporosis: Secondary | ICD-10-CM | POA: Diagnosis not present

## 2018-01-31 DIAGNOSIS — Z170 Estrogen receptor positive status [ER+]: Principal | ICD-10-CM

## 2018-02-17 NOTE — Progress Notes (Signed)
[place]  [phone] [phone]  Clinic follow up Note   Patient Care Team: Lind Covert, MD as PCP - General (Family Medicine) Kirsten Cotton, MD (Ophthalmology) Kirsten Snare, MD (Urology) [name] ([place]) Kirsten Skates, MD as Consulting Physician (General Surgery) Kirsten Silversmith, MD (Inactive) as Consulting Physician (Radiation Oncology) Kirsten Kaufmann, RN as Registered Nurse Kirsten Germany, RN as Registered Nurse Kirsten Bouche, NP as Nurse Practitioner (Nurse Practitioner) Kirsten Merle, MD as Consulting Physician (Hematology) Kirsten Cheese, NP as Nurse Practitioner (Nurse Practitioner) [name], DPM (Podiatry) 02/20/2018  CHIEF COMPLAINTS:  Follow up breast cancer  Oncology History     Breast cancer of lower-outer quadrant of right female breast   Staging form: Breast, AJCC 7th Edition     Clinical stage from 02/19/2015: Stage IA (T1b, N0, M0) - Unsigned       Staging comments: Staged at breast conference on 3.23.16      Pathologic stage from 03/24/2015: Stage IA (T1c, N0, cM0) - Signed by [name] Cutter, MD on 03/31/2015       Staging comments: Staged on final lumpectomy specimen by Dr. Donato Heinz.      Breast cancer of lower-outer quadrant of right female breast ([place])   01/23/2015 Mammogram    Possible mass in right breast warrants further evaluation.  No findings in the left breast suspicious for malignancy      02/03/2015 Breast US    Right breast: 5 x 5 x 5 mm irregular hypoechoic mass with internal color vascularity at 6 o'clock position, 4 cm from the nipple      02/10/2015 Initial Biopsy    Right needle core bx LOQ: Invasive ductal carcinoma, grade 1, ER+ (100%), PR+ (60%), HER2/neu negative (ratio 1.13), Ki67 3%; DCIS.        02/12/2015 Breast MRI    Biopsy-proven malignancy in the central right breast at posterior depth. No MR findings to suggest multicentric or  contralateral malignancy.      02/12/2015 Clinical Stage    Stage IA: T1b N0      03/21/2015 Definitive Surgery    Right breast lumpectomy / SLNB [name]): Invasive ductal carcinoma, grade 1, 1.1 cm, ER+ (100%), PR+ (73%), HER-2 negative (ratio 1.3), Ki67 6%, negative margins / no lymphovascular invasion; DCIS.  One lymph node negative for tumor (0/1).        03/21/2015 Oncotype testing    RS 7, low risk. (ROR 5% for 10-year recurrence with Tamoxifen alone).       03/21/2015 Pathologic Stage    Stage IA: pT1c pN0      05/07/2015 - 06/23/2015 Radiation Therapy    Adjuvant RT completed [name]): Right breast 45 Gy over 25 fractions.  Right breast boost 16 Gy over 8 fractions. Total dose: 60 Gy.      06/02/2015 -  Anti-estrogen oral therapy    Aromasin 25 mg once daily [name]).  Planned duration of therapy: 5 years.      08/15/2015 Survivorship    Survivorship visit completed and copy of survivorship care plan provided to patient.      01/31/2018 Mammogram    IMPRESSION: No mammographic evidence for malignancy.        HISTORY OF PRESENTING ILLNESS:  [name] 67 y.o. female is here because of newly diagnosed breast cancer.  This was discovered by screening mammogtram. Her last screening mammogram in February 2015 was normal.  The screening mammogram on 01/23/2015 showed a  possible mass in the right breast. She underwent diagnostic mammogram and ultrasound on 02/03/2015, which showed a 5 x 5 x 5 mm irregular mass in the right breast 6:00 position.  Your biopsy of the mass showed invasive ductal carcinoma and DCIS.   She has multiple medical problems. She has chronic arthritis, with diffuse join pain, 8/10, she takes tylenol. No chest pain, (+) dyspena on moderate exertion, no nausea, abdominal bloating or change of her bowel habits. She is able to do most of her ADLs, limited light housework, not physically active, spends quite a bit of time sitting during the daytime.  She was  hospitalized for UTI in 9/15 and went to rehab for a month. She has been using crane more regularly after that.    CURRENT THERAPY: aromasin 79m daily, started on 06/02/2015  INTERIM HISTORY:  Kirsten WORLANDreturns for follow up. She presents to the clinic today by herself. She notes she has been having difficulty with tremors and shaking in her hands. She had a Brain MRI that was negative. She is going to be seen by a neurologist. She is currently using a cane to control her balance. She sees her PCP every 3 months for close follow up.   She is compliant with exemestane and has no complaints at this time.   On review of systems, pt denies new pain, SOB or any other complaints at this time. Pertinent positives are listed and detailed within the above HPI.   MEDICAL HISTORY:  Past Medical History:  Diagnosis Date  . Anemia   . Arthritis   . Asthma   . Breast cancer of lower-outer quadrant of right female breast ([facility] 02/13/2015  . Cataract   . CHF, acute ([facility] 05/03/2012  . Depression   . Diabetes mellitus   . Eczema   . Fatty liver    Fatty infiltration of liver noted on 03/2012 CT scan  . Fibromyalgia   . GERD (gastroesophageal reflux disease)   . Heart disease   . History of kidney stones    w/ hx of hydronephrosis - followed by [facility]  . HIV nonspecific serology    2006: indeterminate HIV blood test, seen by ID, felt secondary to cross reacting antibodies with no further workup felt necessary at that time   . Hypertension   . Obesity   . Personal history of radiation therapy 2016   right  . Radiation 05/07/15-06/23/15   Right Breast   GYN HISTORY  Menarchal: 11 LMP: Several years ago Contraceptive: 1979-1986 HRT: NO G3P2:  SURGICAL HISTORY: Past Surgical History:  Procedure Laterality Date  . BREAST BIOPSY Left 02/10/2015   malignant   . BREAST LUMPECTOMY Right 03/21/2015  . CHOLECYSTECTOMY  2003  . CYSTOSCOPY W/ LITHOLAPAXY / EHL    . JOINT REPLACEMENT       bilateral knee replacement  . LEFT AND RIGHT HEART CATHETERIZATION WITH CORONARY ANGIOGRAM N/A 05/02/2012   Procedure: LEFT AND RIGHT HEART CATHETERIZATION WITH CORONARY ANGIOGRAM;  Surgeon: CBurnell Blanks MD;  [facility];  Service: Cardiovascular;  Laterality: N/A;  . RADIOACTIVE SEED GUIDED PARTIAL MASTECTOMY WITH AXILLARY SENTINEL LYMPH NODE BIOPSY Right 03/21/2015   Procedure: RIGHT  PARTIAL MASTECTOMY WITH RADIOACTIVE SEED LOCALIZATION RIGHT  AXILLARY SENTINEL LYMPH NODE BIOPSY;  Surgeon: HFanny Skates MD;  [facility]  Service: General;  Laterality: Right;  . REPLACEMENT TOTAL KNEE BILATERAL  2005 &2006  . VASCULAR SURGERY Right 03/15/2013   Ultrasound guided  sclerotherapy    SOCIAL HISTORY: History   Social History  . Marital Status: Widowed     Spouse Name: [name]  . Number of Children: 3  . Years of Education: 14   Occupational History  . retired-CNA, housekeeping    Social History Main Topics  . Smoking status: Never Smoker   . Smokeless tobacco: Never Used  . Alcohol Use: No  . Drug Use: No  . Sexual Activity: No   Other Topics Concern  . Not on file   Social History Narrative   Health Care POA:    Emergency Contact: son, Delara Shepheard [phone]   End of Life Plan:    Who lives with you:  husband Darrall Dears- [place] is his primary care giver   Any pets: Dixon   Diet: Pt has a varied diet.  Is currently working on smaller portions sizes and no late night snacking for weight loss.   Exercise: Pt exercises 1x weekly with church group.   Seatbelts: Pt reports wearing seatbelt when in vehicles.    Hobbies: word searches, church, time with family and friends             FAMILY HISTORY: Family History  Problem Relation Age of Onset  . Diabetes Mother   . Stroke Mother   . Heart disease Father   . Anemia Father   . Cancer Sister 17       nose cancer   . Cancer Cousin 30       ovarian cancer   .  Cancer Cousin 73       ovarian cancer     ALLERGIES:  has No Known Allergies.  MEDICATIONS:  Current Outpatient Medications  Medication Sig Dispense Refill  . acetaminophen (TYLENOL 8 HOUR ARTHRITIS PAIN) 650 MG CR tablet Take 650 mg by mouth every 8 (eight) hours as needed for pain.    Marland Kitchen albuterol (PROVENTIL HFA;VENTOLIN HFA) 108 (90 Base) MCG/ACT inhaler Inhale 2 puffs into the lungs every 6 (six) hours as needed for wheezing. 1 Inhaler 3  . aspirin 81 MG tablet Take 81 mg by mouth daily.    Marland Kitchen atorvastatin (LIPITOR) 40 MG tablet Take 1 tablet (40 mg total) by mouth daily. 90 tablet 3  . benazepril (LOTENSIN) 20 MG tablet Take 1 tablet (20 mg total) by mouth daily. 90 tablet 2  . colchicine 0.6 MG tablet TAKE 1 TABLET BY MOUTH ONCE DAILY 90 tablet 0  . dorzolamide-timolol (COSOPT) 22.3-6.8 MG/ML ophthalmic solution Place 1 drop into both eyes 2 (two) times daily.  0  . exemestane (AROMASIN) 25 MG tablet TAKE 1 TABLET BY MOUTH ONCE A DAY AFTER BREAKFAST 90 tablet 3  . fluticasone (FLOVENT HFA) 110 MCG/ACT inhaler INHALE 2 PUFFS INTO THE LUNGS 2 TIMES DAILY 1 Inhaler 11  . furosemide (LASIX) 20 MG tablet Take 1 tablet (20 mg total) by mouth daily. 90 tablet 1  . gabapentin (NEURONTIN) 100 MG capsule TAKE 4-5 TABLETS BY MOUTH 3 TIMES DAILY 450 capsule 6  . latanoprost (XALATAN) 0.005 % ophthalmic solution Place 1 drop into both eyes 4 (four) times daily.   0  . LOTEMAX 0.5 % GEL Place 1 application into both eyes at bedtime.  0  . meloxicam (MOBIC) 15 MG tablet Take 1 tablet (15 mg total) by mouth daily. 30 tablet 0  . metFORMIN (GLUCOPHAGE) 1000 MG tablet TAKE 1 TABLET BY MOUTH 2 TIMES DAILY WITH MEALS 180 tablet 3  . metoprolol succinate (  TOPROL-XL) 25 MG 24 hr tablet Take 1 tablet (25 mg total) by mouth daily. (Patient taking differently: Take 50 mg by mouth daily. ) 90 tablet 3  . spironolactone (ALDACTONE) 25 MG tablet TAKE 1 TABLET BY MOUTH ONCE DAILY 90 tablet 0   No current  facility-administered medications for this visit.     REVIEW OF SYSTEMS:   Constitutional: Denies fevers, chills or abnormal night sweats (+) slight hot fhashes  Eyes: Denies blurriness of vision, double vision or watery eyes Ears, nose, mouth, throat, and face: Denies mucositis or sore throat Respiratory: Denies cough, dyspnea or wheezes Cardiovascular: Denies palpitation, chest discomfort or lower extremity swelling Gastrointestinal:  Denies nausea, heartburn or change in bowel habits Skin: Denies abnormal skin rashes Lymphatics: Denies new lymphadenopathy or easy bruising Neurological:Denies numbness, tingling or new weaknesses (+) tremors Behavioral/Psych: Mood is stable, no new changes  Musculoskeletal: (+) joint stiffness (+) left shoulder pain upon lifting above head All other systems were reviewed with the patient and are negative.  PHYSICAL EXAMINATION: ECOG PERFORMANCE STATUS: 2 - Symptomatic, <50% confined to bed  Vitals:   02/20/18 0900  BP: (!) 160/79  Pulse: 80  Resp: 17  Temp: 97.9 F (36.6 C)  SpO2: 100%   Filed Weights   02/20/18 0900  Weight: 225 lb 8 oz (102.3 kg)     GENERAL:alert, no distress and comfortable SKIN: skin color, texture, turgor are normal, no rashes or significant lesions EYES: normal, conjunctiva are pink and non-injected, sclera clear OROPHARYNX:no exudate, no erythema and lips, buccal mucosa, and tongue normal  NECK: supple, thyroid normal size, non-tender, without nodularity LYMPH:  no palpable lymphadenopathy in the cervical, axillary or inguinal (+) lower extremity edema  LUNGS: clear to auscultation and percussion with normal breathing effort HEART: regular rate & rhythm and no murmurs and no lower extremity edema ABDOMEN:abdomen soft, non-tender and normal bowel sounds Musculoskeletal:no cyanosis of digits and no clubbing  PSYCH: alert & oriented x 3 with fluent speech NEURO: no focal motor/sensory deficits Breasts: deferred  today, she had a mammogram on 01/31/18 that was normal   LABORATORY DATA:  I have reviewed the data as listed  CBC Latest Ref Rng & Units 08/03/2017 06/17/2017 06/16/2017  WBC 3.9 - 10.3 10e3/uL 8.1 - 8.8  Hemoglobin 11.6 - 15.9 g/dL 11.5(L) 11.3(A) 10.8(L)  Hematocrit 34.8 - 46.6 % 37.4 - 34.1(L)  Platelets 145 - 400 10e3/uL 185 - 185   CMP Latest Ref Rng & Units 01/06/2018 08/03/2017 06/16/2017  Glucose 65 - 99 mg/dL 169(H) 219(H) 299(H)  BUN 8 - 27 mg/dL 18 40.1(H) 44(H)  Creatinine 0.57 - 1.00 mg/dL 0.95 1.5(H) 1.62(H)  Sodium 134 - 144 mmol/L 143 141 139  Potassium 3.5 - 5.2 mmol/L 4.2 4.9 4.3  Chloride 96 - 106 mmol/L 106 - 107  CO2 20 - 29 mmol/L 19(L) 23 22  Calcium 8.7 - 10.3 mg/dL 9.0 9.6 9.1  Total Protein 6.0 - 8.5 g/dL 6.9 7.5 -  Total Bilirubin 0.0 - 1.2 mg/dL 0.4 0.39 -  Alkaline Phos 39 - 117 IU/L 116 141 -  AST 0 - 40 IU/L 14 16 -  ALT 0 - 32 IU/L 8 12 -   Pathology 03/21/2015 Diagnosis 1. Breast, lumpectomy, right - INVASIVE DUCTAL CARCINOMA, SEE COMMENT. - DUCTAL CARCINOMA IN SITU. - NEGATIVE FOR LYMPH VASCULAR INVASION. - PREVIOUS BIOPSY SITE IDENTIFIED. - SEE TUMOR SYNOPTIC TEMPLATE BELOW. 2. Lymph node, sentinel, biopsy, right axillary - ONE LYMPH NODE, NEGATIVE FOR TUMOR (0/1).  1. BREAST, INVASIVE TUMOR, WITH LYMPH NODES PRESENT Specimen, including laterality and lymph node sampling (sentinel, non-sentinel): Right breast with sentinel lymph node sampling. Procedure: Lumpectomy. Histologic type: Grade: I of III Tubule formation: 1 Nuclear pleomorphism: 2 Mitotic:1 Tumor size (gross measurement): 1.1 cm Margins: Invasive, distance to closest margin: 0.9 cm (medial). In-situ, distance to closest margin: Same as invasive. If margin positive, focally or broadly: N/A Lymphovascular invasion: Absent. Ductal carcinoma in situ: Present. Grade: I of III Extensive intraductal component: Absent. Lobular neoplasia: Absent. Tumor focality: Unifocal. Treatment  effect: None. If present, treatment effect in breast tissue, lymph nodes or both: N/A Extent of tumor: Skin: N/A Nipple: N/A Skeletal muscle: N/A 2 of 4 FINAL for ALFIE, ALDERFER ([id number]) Microscopic Comment(continued) Lymph nodes: Examined: 1 Sentinel 0 Non-sentinel 1 Total Lymph nodes with metastasis: 0 Isolated tumor cells (< 0.2 mm): N/A Micrometastasis: (> 0.2 mm and < 2.0 mm): N/A Macrometastasis: (> 2.0 mm): N/A Extracapsular extension: N/A Breast prognostic profile: Estrogen receptor: Not repeated, previous study demonstrated 100% positivity ([id number]). Progesterone receptor: Not repeated, previous study demonstrated 60% positivity ([id number]). Her 2 neu: Repeated, previous study demonstrated no amplification (1.70) ([id number]). Ki-67: Not repeated, previous study demonstrated 3% proliferation rate ([id number]). Non-neoplastic breast: Previous biopsy site related tissue changes. TNM: pT1c, pN0, pMX Results: IMMUNOHISTOCHEMICAL AND MORPHOMETRIC ANALYSIS BY THE AUTOMATED CELLULAR IMAGING SYSTEM (ACIS) Estrogen Receptor: 100%, POSITIVE, STRONG STAINING INTENSITY Progesterone Receptor: 73%, POSITIVE, STRONG STAINING INTENSITY Proliferation Marker Ki67: 6% 1. CHROMOGENIC IN-SITU HYBRIDIZATION Results: HER-2/NEU BY CISH - NEGATIVE. RESULT RATIO OF HER2: CEP 17 SIGNALS 1.30 AVERAGE HER2 COPY NUMBER PER CELL 1.95  Oncotype Dx: RS 7, predicts 5% 10-year risk of distant recurrence with tamoxifen alone, low risk   RADIOGRAPHIC STUDIES: I have personally reviewed the radiological images as listed and agreed with the findings in the report.  Diagnostic Mammogram 01/31/18 IMPRESSION: No mammographic evidence for malignancy.  DEXA 01/31/18 ASSESSMENT: The BMD measured at Forearm Radius 33% is 0.808 g/cm2 with a T-score of -0.9.  01/27/2017 Mammogram  IMPRESSION: Stable right breast lumpectomy site. No mammographic evidence of malignancy in the bilateral  breasts.  Bone density scan 09/03/2015 ASSESSMENT: The BMD measured at Forearm Radius 33% is 0.925 g/cm2 with a T-score of 0.4. This patient is considered NORMAL according to [facility]) criteria.Lumbar spine was not utilized due to advanced degenerative changes.  ASSESSMENT & PLAN:  67 y.o. postmenopausal African-American American female, screening detected right breast invasive ductal carcinoma and DCIS.  1. Breast cancer of lower-outer quadrant of right breast, invasive ductal carcinoma, pT1c N0 M0, stage IA, ER positive PR positive HER-2 negative, Ki 67 6%, and DCIS -I previously discussed her imaging findings and surgical pathology results with her and her daughters in great details. -She has early-stage breast cancer, high possibility of cured by the complete surgical resection. -Reviewed the possibility of local and distant recurrence after surgery, although it's low, we recommend adjuvant radiation and endocrine therapy to reduce her risk. -Her Oncotype was low risk, no need adjuvant chemotherapy -She has started Aromasin, tolerating well so far, we'll continue, plan for total 5 years. -She is clinically doing well. Labs reviewed from last month, they are otherwise normal. Her breast exam was unremarkable. Her 01/31/18 mammogram showed no evidence of malignancy. There is no clinical concerns of recurrence. -Continue breast cancer surveillance with annual mammogram, self and physician breast exam, I encouraged her to have healthy diet and to be physically active -Next mammogram in 01/2019  2. Bone  health -I again discussed the side effect of osteopenia and osteoporosis from Aromasin - I encouraged her to continue calcium and vitamin D supplement  -Her bone density scan in 08/2015 was normal. We will continue monitoring every 2 years -DEXA from 01/31/18 revealed a T Score of -0.9 -I encouraged her to take Vitamin D or multivitamin  3. Hypertension, diabetes,  arthritis, CHF -She will continue follow-up with her primary care physician  4. CKD stage III  -Her GFR is in 40s, stable overall, continue monitoring -previously advised to avoid nephrotoxin  -She follows up with nephrology, kidney function overall stable.   5. Unstable gait  -she has had this issue for several years, previous was seen by neurologist Dr. Rexene Alberts  -Recent brain MRI was negative -Follow-up with primary care physician, she was recently referred to physical therapy, I encouraged her to dissipate  6. Anemia -She has mild chronic normocytic anemia, Probably secondary to her CKD -Her prior study in 2014 showed elevated ferritin, normal iron -Overall stable, hemoglobin 11.5 previously, we'll continue monitoring.   Plan Continue Exemestane  Lab and f/u in 6 months    All questions were answered. The patient knows to call the clinic with any problems, questions or concerns.  I spent 20 minutes counseling the patient face to face. The total time spent in the appointment was 25 minutes and more than 50% was on counseling.  This document serves as a record of services personally performed by Kirsten Merle, MD. It was created on her behalf by Theresia Bough, a trained medical scribe. The creation of this record is based on the scribe's personal observations and the provider's statements to them.   I have reviewed the above documentation for accuracy and completeness, and I agree with the above.    Kirsten Merle, MD 02/20/2018 4:22 PM

## 2018-02-19 ENCOUNTER — Other Ambulatory Visit: Payer: Self-pay | Admitting: Hematology

## 2018-02-19 DIAGNOSIS — Z170 Estrogen receptor positive status [ER+]: Principal | ICD-10-CM

## 2018-02-19 DIAGNOSIS — C50511 Malignant neoplasm of lower-outer quadrant of right female breast: Secondary | ICD-10-CM

## 2018-02-20 ENCOUNTER — Telehealth: Payer: Self-pay | Admitting: *Deleted

## 2018-02-20 ENCOUNTER — Encounter: Payer: Self-pay | Admitting: Hematology

## 2018-02-20 ENCOUNTER — Inpatient Hospital Stay: Payer: Medicare Other | Attending: Hematology | Admitting: Hematology

## 2018-02-20 VITALS — BP 160/79 | HR 80 | Temp 97.9°F | Resp 17 | Ht 62.0 in | Wt 225.5 lb

## 2018-02-20 DIAGNOSIS — N183 Chronic kidney disease, stage 3 (moderate): Secondary | ICD-10-CM

## 2018-02-20 DIAGNOSIS — R251 Tremor, unspecified: Secondary | ICD-10-CM | POA: Diagnosis not present

## 2018-02-20 DIAGNOSIS — Z79811 Long term (current) use of aromatase inhibitors: Secondary | ICD-10-CM

## 2018-02-20 DIAGNOSIS — M1990 Unspecified osteoarthritis, unspecified site: Secondary | ICD-10-CM | POA: Diagnosis not present

## 2018-02-20 DIAGNOSIS — D649 Anemia, unspecified: Secondary | ICD-10-CM

## 2018-02-20 DIAGNOSIS — I129 Hypertensive chronic kidney disease with stage 1 through stage 4 chronic kidney disease, or unspecified chronic kidney disease: Secondary | ICD-10-CM | POA: Diagnosis not present

## 2018-02-20 DIAGNOSIS — C50511 Malignant neoplasm of lower-outer quadrant of right female breast: Secondary | ICD-10-CM

## 2018-02-20 DIAGNOSIS — R269 Unspecified abnormalities of gait and mobility: Secondary | ICD-10-CM

## 2018-02-20 DIAGNOSIS — I219 Acute myocardial infarction, unspecified: Secondary | ICD-10-CM | POA: Diagnosis not present

## 2018-02-20 DIAGNOSIS — E1122 Type 2 diabetes mellitus with diabetic chronic kidney disease: Secondary | ICD-10-CM

## 2018-02-20 DIAGNOSIS — I509 Heart failure, unspecified: Secondary | ICD-10-CM | POA: Diagnosis not present

## 2018-02-20 DIAGNOSIS — Z170 Estrogen receptor positive status [ER+]: Secondary | ICD-10-CM | POA: Diagnosis not present

## 2018-02-20 DIAGNOSIS — I10 Essential (primary) hypertension: Secondary | ICD-10-CM

## 2018-02-20 DIAGNOSIS — I5042 Chronic combined systolic (congestive) and diastolic (congestive) heart failure: Secondary | ICD-10-CM

## 2018-02-20 NOTE — Telephone Encounter (Signed)
Results of recent DEXA scan reviewed by [name] at pt's appt today.

## 2018-02-28 ENCOUNTER — Other Ambulatory Visit: Payer: Self-pay

## 2018-02-28 ENCOUNTER — Encounter: Payer: Self-pay | Admitting: Family Medicine

## 2018-02-28 ENCOUNTER — Ambulatory Visit (INDEPENDENT_AMBULATORY_CARE_PROVIDER_SITE_OTHER): Payer: Medicare Other | Admitting: Family Medicine

## 2018-02-28 DIAGNOSIS — R194 Change in bowel habit: Secondary | ICD-10-CM

## 2018-02-28 DIAGNOSIS — I10 Essential (primary) hypertension: Secondary | ICD-10-CM

## 2018-02-28 DIAGNOSIS — R262 Difficulty in walking, not elsewhere classified: Secondary | ICD-10-CM | POA: Diagnosis not present

## 2018-02-28 DIAGNOSIS — R2681 Unsteadiness on feet: Secondary | ICD-10-CM | POA: Diagnosis not present

## 2018-02-28 NOTE — Assessment & Plan Note (Signed)
Worsened.  Perhaps related to metformin will try to decrease and see.  Does not have at night which might indicate a functional cause.  If persists or worsens may need a repeat colonoscopy

## 2018-02-28 NOTE — Patient Instructions (Addendum)
Good to see you today!  Thanks for coming in.  For the loose bowel movement - Take 1/2 metformin in AM and one whole in the PM - It the stools are better but not where you want then stop the AM completely - if the bowel movement do not change with stopping metfomrin then go back to twice a day  - You can take imodium twice a day if needed    I will check about the Physical Therapy   Come back in 2 months

## 2018-02-28 NOTE — Assessment & Plan Note (Signed)
BP Readings from Last 3 Encounters:  02/28/18 132/72  02/20/18 (!) 160/79  01/03/18 136/78   At goal

## 2018-02-28 NOTE — Progress Notes (Signed)
Subjective  Patient is presenting with the following illnesses  GAIT She feels is some better.  Using a walker only rarely mostly her cane.  No falls.  No focal weakness nor visual changes.    LOOSE BMs Continues to have multiple soft bowel movement a day sometimes with incontinence.  Using two imodium tabs per day.  No bleeding or nausea and vomiting.  Has tried stopping colchicine without any affect.  Last colonoscopy in 2012.  No edema.  No weight loss Does not have any bowel movements at night or any nocturnal incontinence   HYPERTENSION Disease Monitoring  Home BP Monitoring (Severity) not checking Symptoms - Chest pain- no    Dyspnea- no Medications (Modifying factors) Compliance-  Brings all in. Lightheadedness-  no  Edema- mild  Timing - continuous  Duration - years ROS - See HPI  PMH Lab Review   Potassium  Date Value Ref Range Status  01/06/2018 4.2 3.5 - 5.2 mmol/L Final  08/03/2017 4.9 3.5 - 5.1 mEq/L Final   Sodium  Date Value Ref Range Status  01/06/2018 143 134 - 144 mmol/L Final  08/03/2017 141 136 - 145 mEq/L Final   Creatinine  Date Value Ref Range Status  08/03/2017 1.5 (H) 0.6 - 1.1 mg/dL Final   Creatinine, Ser  Date Value Ref Range Status  01/06/2018 0.95 0.57 - 1.00 mg/dL Final          Chief Complaint noted Review of Symptoms - see HPI PMH - Smoking status noted.     Objective Vital Signs reviewed Using a cane and moving slowly but steadily Psych:  Cognition and judgment appear intact. Alert, communicative  and cooperative with normal attention span and concentration. No apparent delusions, illusions, hallucinations     Assessments/Plans  Gait instability Stable.  Unsure of cause but is not progressive and recent labs and MRI unrevealing.  Will reorder Physical Therapy.  If any changes will resend to Neurology   Frequent bowel movements Worsened.  Perhaps related to metformin will try to decrease and see.  Does not have at night  which might indicate a functional cause.  If persists or worsens may need a repeat colonoscopy   Essential hypertension BP Readings from Last 3 Encounters:  02/28/18 132/72  02/20/18 (!) 160/79  01/03/18 136/78   At goal    See after visit summary for details of patient instuctions

## 2018-02-28 NOTE — Assessment & Plan Note (Signed)
Stable.  Unsure of cause but is not progressive and recent labs and MRI unrevealing.  Will reorder Physical Therapy.  If any changes will resend to Neurology

## 2018-03-22 ENCOUNTER — Encounter: Payer: Self-pay | Admitting: Podiatry

## 2018-03-22 ENCOUNTER — Ambulatory Visit (INDEPENDENT_AMBULATORY_CARE_PROVIDER_SITE_OTHER): Payer: Medicare Other | Admitting: Podiatry

## 2018-03-22 DIAGNOSIS — M2010 Hallux valgus (acquired), unspecified foot: Secondary | ICD-10-CM | POA: Diagnosis not present

## 2018-03-22 DIAGNOSIS — B351 Tinea unguium: Secondary | ICD-10-CM | POA: Diagnosis not present

## 2018-03-22 DIAGNOSIS — E1140 Type 2 diabetes mellitus with diabetic neuropathy, unspecified: Secondary | ICD-10-CM

## 2018-03-22 NOTE — Progress Notes (Signed)
Patient ID: [name], female   DOB: Nov 29, 1951, 67 y.o.   MRN: [id number] Complaint:  Visit Type: Patient returns to the office for preventative foot care services.  Patient states nails are painful walking and wearing her shoes.  She is unable to self treat.  Patient is diabetic.   Podiatric Exam: Vascular: dorsalis pedis and posterior tibial pulses are palpable bilateral. Capillary return is immediate. Temperature gradient is WNL. Skin turgor WNL  Sensorium: Normal Semmes Weinstein monofilament test. Normal tactile sensation bilaterally. Nail Exam: Pt has thick disfigured discolored nails second and third toenails left foot. Ulcer Exam: There is no evidence of ulcer or pre-ulcerative changes or infection. Orthopedic Exam: Muscle tone and strength are WNL. No limitations in general ROM. No crepitus or effusions noted. Foot type and digits show no abnormalities. . Asymptomatic HAV B/L.  Pes planus noted. Skin: No Porokeratosis. No infection or ulcers  Diagnosis:  Onychomycosis  Diabetes    Treatment & Plan Procedures and Treatment: Debridement and grinding of long painful nails both feet.Marland Kitchen Marland KitchenRTC 3 months. Return Visit-Office Procedure: Patient instructed to return to the office for a follow up visit for scheduled preventive foot care services.   Gardiner Barefoot DPM

## 2018-03-28 ENCOUNTER — Encounter: Payer: Self-pay | Admitting: Physical Therapy

## 2018-03-28 ENCOUNTER — Other Ambulatory Visit: Payer: Self-pay

## 2018-03-28 ENCOUNTER — Ambulatory Visit: Payer: Medicare Other | Attending: Family Medicine | Admitting: Physical Therapy

## 2018-03-28 DIAGNOSIS — M6281 Muscle weakness (generalized): Secondary | ICD-10-CM | POA: Insufficient documentation

## 2018-03-28 DIAGNOSIS — R2681 Unsteadiness on feet: Secondary | ICD-10-CM | POA: Insufficient documentation

## 2018-03-28 DIAGNOSIS — R2689 Other abnormalities of gait and mobility: Secondary | ICD-10-CM | POA: Insufficient documentation

## 2018-03-28 NOTE — Therapy (Signed)
[facility] [address] [place], [place], [address] Phone: [phone]   Fax:  [phone]  Physical Therapy Evaluation  Patient Details  Name: [name] MRN: [id number] Date of Birth: 1951-05-16 Referring Provider: Talbert Cage, MD   Encounter Date: 03/28/2018  PT End of Session - 03/28/18 2152    Visit Number  1    Number of Visits  9    Date for PT Re-Evaluation  04/27/18    Authorization Type  UHC Medicare & Medicaid    Authorization Time Period  03-28-18 - 06-26-18    PT Start Time  1022    PT Stop Time  1100    PT Time Calculation (min)  38 min       Past Medical History:  Diagnosis Date  . Anemia   . Arthritis   . Asthma   . Breast cancer of lower-outer quadrant of right female breast ([place]) 02/13/2015  . Cataract   . CHF, acute ([facility]) 05/03/2012  . Depression   . Diabetes mellitus   . Eczema   . Fatty liver    Fatty infiltration of liver noted on 03/2012 CT scan  . Fibromyalgia   . GERD (gastroesophageal reflux disease)   . Heart disease   . History of kidney stones    w/ hx of hydronephrosis - followed by [facility]  . HIV nonspecific serology    2006: indeterminate HIV blood test, seen by ID, felt secondary to cross reacting antibodies with no further workup felt necessary at that time   . Hypertension   . Obesity   . Personal history of radiation therapy 2016   right  . Radiation 05/07/15-06/23/15   Right Breast    Past Surgical History:  Procedure Laterality Date  . BREAST BIOPSY Left 02/10/2015   malignant   . BREAST LUMPECTOMY Right 03/21/2015  . CHOLECYSTECTOMY  2003  . CYSTOSCOPY W/ LITHOLAPAXY / EHL    . JOINT REPLACEMENT     bilateral knee replacement  . LEFT AND RIGHT HEART CATHETERIZATION WITH CORONARY ANGIOGRAM N/A 05/02/2012   Procedure: LEFT AND RIGHT HEART CATHETERIZATION WITH CORONARY ANGIOGRAM;  Surgeon: Burnell Blanks, MD;  Location: [facility];   Service: Cardiovascular;  Laterality: N/A;  . RADIOACTIVE SEED GUIDED PARTIAL MASTECTOMY WITH AXILLARY SENTINEL LYMPH NODE BIOPSY Right 03/21/2015   Procedure: RIGHT  PARTIAL MASTECTOMY WITH RADIOACTIVE SEED LOCALIZATION RIGHT  AXILLARY SENTINEL LYMPH NODE BIOPSY;  Surgeon: Fanny Skates, MD;  Location: [facility];  Service: General;  Laterality: Right;  . REPLACEMENT TOTAL KNEE BILATERAL  2005 &2006  . VASCULAR SURGERY Right 03/15/2013   Ultrasound guided sclerotherapy    There were no vitals filed for this visit.   Subjective Assessment - 03/28/18 2140    Subjective  Pt reports she has had unsteadiness with ambulation for approximately the past 2 - 2 1/2 years but seems it is getting worse; reports tremors at times accompanied by stuttering; pt reports she has been using cane for 20+ years    Pertinent History  Breast cancer 2015:  Neuropathy; DM type 2      Diagnostic tests  MRI - (negative)    Patient Stated Goals  "get back to my normal way of walking"    Pain Location  -- generalized joint pain due to OA    Pain Orientation  Other (Comment) knees, hips and hands    Pain Descriptors / Indicators  Aching;Discomfort;Sore    Pain Type  Chronic pain    Pain Onset  More than a month ago    Pain Frequency  Constant    Aggravating Factors   cold    Pain Relieving Factors  medication helps (Gabapentin)         [facility] PT Assessment - 03/28/18 1032      Assessment   Medical Diagnosis  Gait abnormality    Referring Provider  Talbert Cage, MD    Onset Date/Surgical Date  -- Feb. 2019      Precautions   Precautions  Fall      Balance Screen   Has the patient fallen in the past 6 months  No    Has the patient had a decrease in activity level because of a fear of falling?   No    Is the patient reluctant to leave their home because of a fear of falling?   No      Home Environment   Living Environment  Private residence    Type of [place] to enter    Entrance Stairs-Number of Steps  3    Entrance Stairs-Rails  None pole is there which she holds onto    [place]  One level      Prior Function   Level of Independence  Independent with household mobility without device;Independent with basic ADLs;Independent with community mobility with device;Needs assistance with homemaking      Tone   Assessment Location  Right Lower Extremity;Left Lower Extremity      ROM / Strength   AROM / PROM / Strength  Strength      Strength   Overall Strength  Within functional limits for tasks performed    Strength Assessment Site  --      Transfers   Transfers  Sit to Stand    Number of Reps  Other reps (comment) 2    Comments  pt requires bil. UE support for sit to stand transfer       Ambulation/Gait   Ambulation/Gait  Yes    Ambulation/Gait Assistance  4: Min guard    Ambulation Distance (Feet)  100 Feet    Assistive device  Straight cane    Gait Pattern  Step-through pattern;Wide base of support;Decreased trunk rotation    Ambulation Surface  Level;Indoor    Gait velocity  13.31 secs = 2.46 ft/sec       Balance   Balance Assessed  Yes      Static Standing Balance   Static Standing - Balance Support  No upper extremity supported    Static Standing - Level of Assistance  5: Stand by assistance    Static Standing - Comment/# of Minutes  2                Objective measurements completed on examination: See above findings.              PT Education - 03/28/18 2151    Education provided  Yes    Education Details  recommended use of RW rather than SPC to pt to reduce fall risk - pt declines use of RW at this time    Person(s) Educated  Patient    Methods  Explanation    Comprehension  Verbalized understanding          PT Long Term Goals - 03/28/18 2204      PT LONG TERM GOAL #1   Title  Improve  Berg score by at least 5 points to reduce fall risk.    Baseline  to be completed next session     Time  4    Period  Weeks    Status  New    Target Date  04/27/18      PT LONG TERM GOAL #2   Title  Improve TUG score to </= 14 secs with cane to demo improved functional mobility.    Baseline  17.12 secs with cane    Time  4    Period  Weeks    Status  New    Target Date  04/27/18      PT LONG TERM GOAL #3   Title  Increase gait velocity from 2.46 ft/sec to >/= 2.8 ft/sec for increased gait efficiency.    Baseline  13.31 secs = 2.46 ft/sec on 03-28-18    Time  4    Period  Weeks    Status  New    Target Date  04/27/18      PT LONG TERM GOAL #4   Title  Independent in HEP for balance exercises.    Time  4    Period  Weeks    Status  New    Target Date  04/27/18             Plan - 03/28/18 2154    Clinical Impression Statement  Pt presents with gait and balance deficits due to neuropathy in feet secondary to DM.  Pt is currently using a SPC but RW has been recommended to increase safety with gait and to reduce fall risk.  Pt is at risk for fall per TUG score of 17.12 secs;  pt unable to safely stand unsupported > 1".                                                                                                                  History and Personal Factors relevant to plan of care:  No exact etiology known for gait instability - appears to be due to neuropathy:  pt lives with her daughter; declines use of RW at this time - wishes to continue to use [facility]; pt has glaucoma    Clinical Presentation  Evolving    Clinical Presentation due to:  neuropathy due to DM     Clinical Decision Making  Moderate    Rehab Potential  Good    PT Frequency  2x / week    PT Duration  4 weeks    PT Treatment/Interventions  ADLs/Self Care Home Management;DME Instruction;Gait training;Stair training;Therapeutic activities;Therapeutic exercise;Balance training;Neuromuscular re-education;Patient/family education    PT Next Visit Plan  complete Berg test; begin HEP for functional strengthening and  balance exercises    PT Home Exercise Plan  see above    Recommended Other Services  use of RW recommended for assistance with ambulation    Consulted and Agree with Plan of Care  Patient       Patient will benefit from skilled therapeutic  intervention in order to improve the following deficits and impairments:  Abnormal gait, Decreased activity tolerance, Decreased balance, Decreased safety awareness, Decreased strength, Pain, Impaired vision/preception  Visit Diagnosis: Other abnormalities of gait and mobility - Plan: PT plan of care cert/re-cert  Muscle weakness (generalized) - Plan: PT plan of care cert/re-cert  Unsteadiness on feet - Plan: PT plan of care cert/re-cert     Problem List Patient Active Problem List   Diagnosis Date Noted  . Gait instability 01/03/2018  . Frequent bowel movements 11/02/2017  . Tremor of both hands   . Stuttering   . Essential hypertension   . Hyperlipidemia 08/13/2015  . Breast cancer of lower-outer quadrant of right female breast ([facility]) 02/13/2015  . Vitamin D deficiency 02/06/2015  . Type 2 diabetes mellitus with diabetic foot deformity ([facility]) 01/01/2014  . Chronic combined systolic and diastolic heart failure ([place]) 02/26/2013  . OBESITY 02/05/2009  . Asthma, intermittent 02/08/2008  . NEPHROLITHIASIS 01/26/2007  . DJD (degenerative joint disease), multiple sites 01/26/2007    Alda Lea, PT 03/28/2018, 10:14 PM  [facility] [address] [place], [place], [address] Phone: [phone]   Fax:  [phone]  Name: LADINE KIPER MRN: [id number] Date of Birth: 03-12-1951

## 2018-04-01 ENCOUNTER — Other Ambulatory Visit: Payer: Self-pay | Admitting: Family Medicine

## 2018-04-03 ENCOUNTER — Encounter: Payer: Self-pay | Admitting: Physical Therapy

## 2018-04-03 ENCOUNTER — Ambulatory Visit: Payer: Medicare Other | Attending: Family Medicine | Admitting: Physical Therapy

## 2018-04-03 DIAGNOSIS — R2689 Other abnormalities of gait and mobility: Secondary | ICD-10-CM

## 2018-04-03 DIAGNOSIS — M6281 Muscle weakness (generalized): Secondary | ICD-10-CM

## 2018-04-03 DIAGNOSIS — R2681 Unsteadiness on feet: Secondary | ICD-10-CM

## 2018-04-03 NOTE — Patient Instructions (Addendum)
Functional Quadriceps: Sit to Stand    Sit on edge of chair, feet flat on floor. Stand upright/full posture. Slowly sit back down. Use arms as needed. Keep in mind: the lower the chair surface, the harder it will be. Repeat _10_ times per set. Do _1_ sets per session. Do _1-2_ sessions per day.  http://orth.exer.us/735   Copyright  VHI. All rights reserved.   Hold onto something sturdy (kitchen counter/table, heavy chair) to assist with balance:  Toe / Heel Raise (Standing)    Standing with support: lift heels and hold for 5 seconds. Then lift toes and hold for 5 seconds.  Repeat _10_ times. 1-2 times a day.  Copyright  VHI. All rights reserved.    Hip (Side)    Standing: slowly lift one leg out to the side and then back in. STAY TALL, DO NOT LEAN TOWARD OTHER SIDE. Then repeat with the other leg, alternating legs. 10 reps each side.  1-2 times a day.  Copyright  VHI. All rights reserved.

## 2018-04-03 NOTE — Therapy (Signed)
[facility] [address] [place], [place], [address] Phone: [phone]   Fax:  [phone]  Physical Therapy Treatment  Patient Details  Name: [name] MRN: [id number] Date of Birth: Jun 12, 1951 Referring Provider: Talbert Cage, MD   Encounter Date: 04/03/2018  PT End of Session - 04/03/18 0941    Visit Number  2    Number of Visits  9    Date for PT Re-Evaluation  04/27/18    Authorization Type  UHC Medicare & Medicaid    Authorization Time Period  03-28-18 - 06-26-18    PT Start Time  0932    PT Stop Time  1015    PT Time Calculation (min)  43 min    Equipment Utilized During Treatment  Gait belt    Activity Tolerance  Patient tolerated treatment well;Patient limited by fatigue;Other (comment) mild incr in knee pain with ex's that resolved with rest     Behavior During Therapy  [facility] for tasks assessed/performed       Past Medical History:  Diagnosis Date  . Anemia   . Arthritis   . Asthma   . Breast cancer of lower-outer quadrant of right female breast ([facility]) 02/13/2015  . Cataract   . CHF, acute ([facility]) 05/03/2012  . Depression   . Diabetes mellitus   . Eczema   . Fatty liver    Fatty infiltration of liver noted on 03/2012 CT scan  . Fibromyalgia   . GERD (gastroesophageal reflux disease)   . Heart disease   . History of kidney stones    w/ hx of hydronephrosis - followed by [facility]  . HIV nonspecific serology    2006: indeterminate HIV blood test, seen by ID, felt secondary to cross reacting antibodies with no further workup felt necessary at that time   . Hypertension   . Obesity   . Personal history of radiation therapy 2016   right  . Radiation 05/07/15-06/23/15   Right Breast    Past Surgical History:  Procedure Laterality Date  . BREAST BIOPSY Left 02/10/2015   malignant   . BREAST LUMPECTOMY Right 03/21/2015  . CHOLECYSTECTOMY  2003  . CYSTOSCOPY W/ LITHOLAPAXY / EHL    .  JOINT REPLACEMENT     bilateral knee replacement  . LEFT AND RIGHT HEART CATHETERIZATION WITH CORONARY ANGIOGRAM N/A 05/02/2012   Procedure: LEFT AND RIGHT HEART CATHETERIZATION WITH CORONARY ANGIOGRAM;  Surgeon: Burnell Blanks, MD;  Location: [facility] CATH LAB;  Service: Cardiovascular;  Laterality: N/A;  . RADIOACTIVE SEED GUIDED PARTIAL MASTECTOMY WITH AXILLARY SENTINEL LYMPH NODE BIOPSY Right 03/21/2015   Procedure: RIGHT  PARTIAL MASTECTOMY WITH RADIOACTIVE SEED LOCALIZATION RIGHT  AXILLARY SENTINEL LYMPH NODE BIOPSY;  Surgeon: Fanny Skates, MD;  Location: [facility];  Service: General;  Laterality: Right;  . REPLACEMENT TOTAL KNEE BILATERAL  2005 &2006  . VASCULAR SURGERY Right 03/15/2013   Ultrasound guided sclerotherapy    There were no vitals filed for this visit.  Subjective Assessment - 04/03/18 0935    Subjective  No new falls. Pain is "all over", mostly in hips and knees.     Pertinent History  Breast cancer 2015:  Neuropathy; DM type 2      Diagnostic tests  MRI - (negative)    Patient Stated Goals  "get back to my normal way of walking"    Currently in Pain?  Yes    Pain Score  7     Pain  Location  Generalized    Pain Descriptors / Indicators  Aching;Discomfort;Sore    Pain Type  Chronic pain    Pain Onset  More than a month ago    Pain Frequency  Constant    Aggravating Factors   cold, rainy weather    Pain Relieving Factors  medication helps         [facility] PT Assessment - 04/03/18 0943      Standardized Balance Assessment   Standardized Balance Assessment  Berg Balance Test      Berg Balance Test   Sit to Stand  Able to stand using hands after several tries    Standing Unsupported  Able to stand 2 minutes with supervision    Sitting with Back Unsupported but Feet Supported on Floor or Stool  Able to sit safely and securely 2 minutes    Stand to Sit  Sits independently, has uncontrolled descent    Transfers  Able to transfer safely, definite need  of hands    Standing Unsupported with Eyes Closed  Able to stand 10 seconds with supervision    Standing Ubsupported with Feet Together  Able to place feet together independently but unable to hold for 30 seconds    From Standing, Reach Forward with Outstretched Arm  Can reach forward >12 cm safely (5") 5 inches    From Standing Position, Pick up Object from Floor  Able to pick up shoe, needs supervision    From Standing Position, Turn to Look Behind Over each Shoulder  Looks behind one side only/other side shows less weight shift left> right side    Turn 360 Degrees  Needs close supervision or verbal cueing > 6 seconds both ways    Standing Unsupported, Alternately Place Feet on Step/Stool  Able to complete >2 steps/needs minimal assist conpleted 8, needed min assist/stepping strategy for one LOB    Standing Unsupported, One Foot in Front  Able to take small step independently and hold 30 seconds    Standing on One Leg  Able to lift leg independently and hold equal to or more than 3 seconds    Total Score  33      Issued the following to pt's HEP today with min guard assist for balance. Cues for posture and ex form/technique. Functional Quadriceps: Sit to Stand    Sit on edge of chair, feet flat on floor. Stand upright/full posture. Slowly sit back down. Use arms as needed. Keep in mind: the lower the chair surface, the harder it will be. Repeat _10_ times per set. Do _1_ sets per session. Do _1-2_ sessions per day.  http://orth.exer.us/735   Copyright  VHI. All rights reserved.   Hold onto something sturdy (kitchen counter/table, heavy chair) to assist with balance:  Toe / Heel Raise (Standing)    Standing with support: lift heels and hold for 5 seconds. Then lift toes and hold for 5 seconds.  Repeat _10_ times. 1-2 times a day.  Copyright  VHI. All rights reserved.    Hip (Side)    Standing: slowly lift one leg out to the side and then back in. STAY TALL, DO NOT LEAN  TOWARD OTHER SIDE. Then repeat with the other leg, alternating legs. 10 reps each side.  1-2 times a day.  Copyright  VHI. All rights reserved.         PT Education - 04/03/18 1012    Education provided  Yes    Education Details  results of [employer]  test; HEP for strengthening    Person(s) Educated  Patient    Methods  Explanation;Demonstration;Verbal cues;Handout    Comprehension  Verbalized understanding;Returned demonstration;Need further instruction;Verbal cues required          PT Long Term Goals - 04/03/18 1539      PT LONG TERM GOAL #1   Title  Improve Berg score by at least 5 points to reduce fall risk. (all STGs due by 04/27/18)    Baseline  04/03/18: baseline of 33/56 scored today    Time  4    Period  Weeks    Status  On-going      PT LONG TERM GOAL #2   Title  Improve TUG score to </= 14 secs with cane to demo improved functional mobility.    Baseline  17.12 secs with cane    Time  4    Period  Weeks    Status  On-going      PT LONG TERM GOAL #3   Title  Increase gait velocity from 2.46 ft/sec to >/= 2.8 ft/sec for increased gait efficiency.    Baseline  13.31 secs = 2.46 ft/sec on 03-28-18    Time  4    Period  Weeks    Status  On-going      PT LONG TERM GOAL #4   Title  Independent in HEP for balance exercises.    Time  4    Period  Weeks    Status  On-going            Plan - 04/03/18 0942    Clinical Impression Statement  Today's skilled session initially focused on establishing baseline value for Berg Balance test with pt scoring 33/56 today. Remainder of session addressed establishment of an HEP for strengthening and balance with no issues reported with performance in session. Pt continues to be resistant to use of RW vs cane, will continue to address for safety/balance reasons. Pt is progressing toward goals and should benefit from continued PT to progress toward unmet goals.     Rehab Potential  Good    PT Frequency  2x / week    PT  Duration  4 weeks    PT Treatment/Interventions  ADLs/Self Care Home Management;DME Instruction;Gait training;Stair training;Therapeutic activities;Therapeutic exercise;Balance training;Neuromuscular re-education;Patient/family education    PT Next Visit Plan  continue at work on LE strenthening and balance     PT Home Exercise Plan  see above    Consulted and Agree with Plan of Care  Patient       Patient will benefit from skilled therapeutic intervention in order to improve the following deficits and impairments:  Abnormal gait, Decreased activity tolerance, Decreased balance, Decreased safety awareness, Decreased strength, Pain, Impaired vision/preception  Visit Diagnosis: Other abnormalities of gait and mobility  Muscle weakness (generalized)  Unsteadiness on feet     Problem List Patient Active Problem List   Diagnosis Date Noted  . Gait instability 01/03/2018  . Frequent bowel movements 11/02/2017  . Tremor of both hands   . Stuttering   . Essential hypertension   . Hyperlipidemia 08/13/2015  . Breast cancer of lower-outer quadrant of right female breast ([place]) 02/13/2015  . Vitamin D deficiency 02/06/2015  . Type 2 diabetes mellitus with diabetic foot deformity ([place]) 01/01/2014  . Chronic combined systolic and diastolic heart failure ([place]) 02/26/2013  . OBESITY 02/05/2009  . Asthma, intermittent 02/08/2008  . NEPHROLITHIASIS 01/26/2007  . DJD (degenerative joint disease), multiple sites 01/26/2007  Willow Ora, PTA, [facility] [address], [place], [place] [address] [phone] 04/03/18, 9:45 PM   Name: [name] MRN: [id number] Date of Birth: Jan 07, 1951

## 2018-04-06 ENCOUNTER — Encounter: Payer: Self-pay | Admitting: Physical Therapy

## 2018-04-06 ENCOUNTER — Ambulatory Visit: Payer: Medicare Other | Admitting: Physical Therapy

## 2018-04-06 DIAGNOSIS — R2689 Other abnormalities of gait and mobility: Secondary | ICD-10-CM

## 2018-04-06 DIAGNOSIS — M6281 Muscle weakness (generalized): Secondary | ICD-10-CM | POA: Diagnosis not present

## 2018-04-06 DIAGNOSIS — R2681 Unsteadiness on feet: Secondary | ICD-10-CM | POA: Diagnosis not present

## 2018-04-06 NOTE — Patient Instructions (Signed)
Marching In-Place    Standing straight, alternate bringing knees toward trunk. Arms swing alternately. Lift each leg 15 times. Do _1-2__ times per day.  HOLD ONTO COUNTER AS NEEDED   SINGLE LIMB STANCE    Stance: single leg on floor. Raise leg. Hold _10__ seconds. Repeat with other leg. _2__ reps per set, _1-2__ sets per day, _5__ days per week.  HOLD ONTO COUNTER for SUPPORT!  Copyright  VHI. All rights reserved.

## 2018-04-07 ENCOUNTER — Encounter: Payer: Self-pay | Admitting: Physical Therapy

## 2018-04-07 NOTE — Therapy (Signed)
[facility] [address] [place], [place], [address] Phone: [phone]   Fax:  [phone]  Physical Therapy Treatment  Patient Details  Name: [name] MRN: [id number] Date of Birth: 1951-05-15 Referring Provider: Talbert Cage, MD   Encounter Date: 04/06/2018  PT End of Session - 04/07/18 1013    Visit Number  3    Number of Visits  9    Date for PT Re-Evaluation  04/27/18    Authorization Type  [facility] Medicare & Medicaid    Authorization Time Period  03-28-18 - 06-26-18    Equipment Utilized During Treatment  Gait belt       Past Medical History:  Diagnosis Date  . Anemia   . Arthritis   . Asthma   . Breast cancer of lower-outer quadrant of right female breast ([place]) 02/13/2015  . Cataract   . CHF, acute ([place]) 05/03/2012  . Depression   . Diabetes mellitus   . Eczema   . Fatty liver    Fatty infiltration of liver noted on 03/2012 CT scan  . Fibromyalgia   . GERD (gastroesophageal reflux disease)   . Heart disease   . History of kidney stones    w/ hx of hydronephrosis - followed by [facility]  . HIV nonspecific serology    2006: indeterminate HIV blood test, seen by ID, felt secondary to cross reacting antibodies with no further workup felt necessary at that time   . Hypertension   . Obesity   . Personal history of radiation therapy 2016   right  . Radiation 05/07/15-06/23/15   Right Breast    Past Surgical History:  Procedure Laterality Date  . BREAST BIOPSY Left 02/10/2015   malignant   . BREAST LUMPECTOMY Right 03/21/2015  . CHOLECYSTECTOMY  2003  . CYSTOSCOPY W/ LITHOLAPAXY / EHL    . JOINT REPLACEMENT     bilateral knee replacement  . LEFT AND RIGHT HEART CATHETERIZATION WITH CORONARY ANGIOGRAM N/A 05/02/2012   Procedure: LEFT AND RIGHT HEART CATHETERIZATION WITH CORONARY ANGIOGRAM;  Surgeon: Burnell Blanks, MD;  Location: [facility] CATH LAB;  Service: Cardiovascular;  Laterality: N/A;   . RADIOACTIVE SEED GUIDED PARTIAL MASTECTOMY WITH AXILLARY SENTINEL LYMPH NODE BIOPSY Right 03/21/2015   Procedure: RIGHT  PARTIAL MASTECTOMY WITH RADIOACTIVE SEED LOCALIZATION RIGHT  AXILLARY SENTINEL LYMPH NODE BIOPSY;  Surgeon: Fanny Skates, MD;  Location: [facility];  Service: General;  Laterality: Right;  . REPLACEMENT TOTAL KNEE BILATERAL  2005 &2006  . VASCULAR SURGERY Right 03/15/2013   Ultrasound guided sclerotherapy    There were no vitals filed for this visit.  Subjective Assessment - 04/07/18 0953    Subjective  Pt reports she is doing the exercises she was given in previous session this week - states "I'm trying"    Pertinent History  Breast cancer 2015:  Neuropathy; DM type 2      Diagnostic tests  MRI - (negative)    Patient Stated Goals  "get back to my normal way of walking"    Currently in Pain?  Yes    Pain Score  7     Pain Location  Wrist    Pain Orientation  Right;Left    Pain Descriptors / Indicators  Aching;Throbbing    Pain Type  Chronic pain    Pain Onset  More than a month ago    Pain Frequency  Constant  [facility] Adult PT Treatment/Exercise - 04/07/18 0001      Transfers   Transfers  Sit to Stand    Number of Reps  Other reps (comment) 15    Comments  on blue AIrex      Ambulation/Gait   Ambulation/Gait  Yes    Ambulation/Gait Assistance  5: Supervision;4: Min guard min assist x 1 for balance recovery    Ambulation/Gait Assistance Details  pt had 1 LOB after 1 complete lap, requiring mod assist to recover     Ambulation Distance (Feet)  350 Feet    Assistive device  Straight cane    Gait Pattern  Step-through pattern;Wide base of support;Decreased trunk rotation          Balance Exercises - 04/07/18 1008      Balance Exercises: Standing   Standing Eyes Opened  Wide (BOA);Solid surface;2 reps;10 secs    Rockerboard  Anterior/posterior;10 reps;UE support inside // bars    Step Ups  Forward;6  inch;UE support 1 10 reps each leg    Sidestepping  2 reps inside bars    Other Standing Exercises  Pt performed forward, back and side kicks 10 reps each inside // bars with UE support ; marching in place 10 reps each leg with UEs upport;  marching forward 2 reps with CGA with 1 UE support;  pt performed alternate tap ups to 1st step with UE support , then without UE support 10 reps each leg - had moderate LOB without UE support; performed tpa ups to 2nd step with light UE support on rail 10 reps each leg                                PT Education - 04/07/18 1012    Education provided  Yes    Education Details  added marching and SLS to HEP    Person(s) Educated  Patient    Methods  Explanation;Demonstration;Handout    Comprehension  Verbalized understanding;Returned demonstration          PT Long Term Goals - 04/03/18 1539      PT LONG TERM GOAL #1   Title  Improve Berg score by at least 5 points to reduce fall risk. (all STGs due by 04/27/18)    Baseline  04/03/18: baseline of 33/56 scored today    Time  4    Period  Weeks    Status  On-going      PT LONG TERM GOAL #2   Title  Improve TUG score to </= 14 secs with cane to demo improved functional mobility.    Baseline  17.12 secs with cane    Time  4    Period  Weeks    Status  On-going      PT LONG TERM GOAL #3   Title  Increase gait velocity from 2.46 ft/sec to >/= 2.8 ft/sec for increased gait efficiency.    Baseline  13.31 secs = 2.46 ft/sec on 03-28-18    Time  4    Period  Weeks    Status  On-going      PT LONG TERM GOAL #4   Title  Independent in HEP for balance exercises.    Time  4    Period  Weeks    Status  On-going            Plan - 04/07/18 1013    Clinical Impression Statement  Pt had sudden LOB after performing step up exercise at 6" steps and then again after amb. 115' with use of SPC - requiring mod assist to recover; pt needs to improve stepping strategy to assist with balance recovery.  Pt  also had dyspnea with exercises - required frequent short standing rest breaks for breathing.     Rehab Potential  Good    PT Frequency  2x / week    PT Duration  4 weeks    PT Treatment/Interventions  ADLs/Self Care Home Management;DME Instruction;Gait training;Stair training;Therapeutic activities;Therapeutic exercise;Balance training;Neuromuscular re-education;Patient/family education    PT Next Visit Plan  check exercises added to HEP on 04-06-18; continue dynamic balance and gait training     PT Home Exercise Plan  see above    Consulted and Agree with Plan of Care  Patient       Patient will benefit from skilled therapeutic intervention in order to improve the following deficits and impairments:  Abnormal gait, Decreased activity tolerance, Decreased balance, Decreased safety awareness, Decreased strength, Pain, Impaired vision/preception  Visit Diagnosis: Other abnormalities of gait and mobility  Unsteadiness on feet     Problem List Patient Active Problem List   Diagnosis Date Noted  . Gait instability 01/03/2018  . Frequent bowel movements 11/02/2017  . Tremor of both hands   . Stuttering   . Essential hypertension   . Hyperlipidemia 08/13/2015  . Breast cancer of lower-outer quadrant of right female breast ([place]) 02/13/2015  . Vitamin D deficiency 02/06/2015  . Type 2 diabetes mellitus with diabetic foot deformity ([place]) 01/01/2014  . Chronic combined systolic and diastolic heart failure ([place]) 02/26/2013  . OBESITY 02/05/2009  . Asthma, intermittent 02/08/2008  . NEPHROLITHIASIS 01/26/2007  . DJD (degenerative joint disease), multiple sites 01/26/2007    Alda Lea, PT 04/07/2018, 10:19 AM  [facility] [address] [place], [place], [address] Phone: [phone]   Fax:  [phone]  Name: JOELLEN TULLOS MRN: [id number] Date of Birth: Mar 13, 1951

## 2018-04-11 ENCOUNTER — Other Ambulatory Visit: Payer: Self-pay | Admitting: Family Medicine

## 2018-04-12 ENCOUNTER — Ambulatory Visit: Payer: Medicare Other | Admitting: Physical Therapy

## 2018-04-12 ENCOUNTER — Encounter: Payer: Self-pay | Admitting: Physical Therapy

## 2018-04-12 DIAGNOSIS — R2681 Unsteadiness on feet: Secondary | ICD-10-CM | POA: Diagnosis not present

## 2018-04-12 DIAGNOSIS — M6281 Muscle weakness (generalized): Secondary | ICD-10-CM

## 2018-04-12 DIAGNOSIS — R2689 Other abnormalities of gait and mobility: Secondary | ICD-10-CM | POA: Diagnosis not present

## 2018-04-13 NOTE — Therapy (Signed)
[facility] [address] [place], [place], [address] Phone: [phone]   Fax:  [phone]  Physical Therapy Treatment  Patient Details  Name: [name] MRN: [id number] Date of Birth: 17-Jul-1951 Referring Provider: Talbert Cage, MD   Encounter Date: 04/12/2018  PT End of Session - 04/12/18 1152    Visit Number  4    Number of Visits  9    Date for PT Re-Evaluation  04/27/18    Authorization Type  [employer] Medicare & Medicaid    Authorization Time Period  03-28-18 - 06-26-18    PT Start Time  1148    PT Stop Time  1230    PT Time Calculation (min)  42 min    Equipment Utilized During Treatment  Gait belt    Activity Tolerance  Patient tolerated treatment well;Patient limited by fatigue    Behavior During Therapy  [facility] for tasks assessed/performed       Past Medical History:  Diagnosis Date  . Anemia   . Arthritis   . Asthma   . Breast cancer of lower-outer quadrant of right female breast ([place]) 02/13/2015  . Cataract   . CHF, acute ([place]) 05/03/2012  . Depression   . Diabetes mellitus   . Eczema   . Fatty liver    Fatty infiltration of liver noted on 03/2012 CT scan  . Fibromyalgia   . GERD (gastroesophageal reflux disease)   . Heart disease   . History of kidney stones    w/ hx of hydronephrosis - followed by [facility]  . HIV nonspecific serology    2006: indeterminate HIV blood test, seen by ID, felt secondary to cross reacting antibodies with no further workup felt necessary at that time   . Hypertension   . Obesity   . Personal history of radiation therapy 2016   right  . Radiation 05/07/15-06/23/15   Right Breast    Past Surgical History:  Procedure Laterality Date  . BREAST BIOPSY Left 02/10/2015   malignant   . BREAST LUMPECTOMY Right 03/21/2015  . CHOLECYSTECTOMY  2003  . CYSTOSCOPY W/ LITHOLAPAXY / EHL    . JOINT REPLACEMENT     bilateral knee replacement  . LEFT AND RIGHT HEART  CATHETERIZATION WITH CORONARY ANGIOGRAM N/A 05/02/2012   Procedure: LEFT AND RIGHT HEART CATHETERIZATION WITH CORONARY ANGIOGRAM;  Surgeon: Burnell Blanks, MD;  Location: [facility] CATH LAB;  Service: Cardiovascular;  Laterality: N/A;  . RADIOACTIVE SEED GUIDED PARTIAL MASTECTOMY WITH AXILLARY SENTINEL LYMPH NODE BIOPSY Right 03/21/2015   Procedure: RIGHT  PARTIAL MASTECTOMY WITH RADIOACTIVE SEED LOCALIZATION RIGHT  AXILLARY SENTINEL LYMPH NODE BIOPSY;  Surgeon: Fanny Skates, MD;  Location: [facility];  Service: General;  Laterality: Right;  . REPLACEMENT TOTAL KNEE BILATERAL  2005 &2006  . VASCULAR SURGERY Right 03/15/2013   Ultrasound guided sclerotherapy    There were no vitals filed for this visit.  Subjective Assessment - 04/12/18 1150    Subjective  Reports doing the ex's at home. "I wobble a little bit", denies any falls. "The usual" when asked about pain.     Pertinent History  Breast cancer 2015:  Neuropathy; DM type 2      Diagnostic tests  MRI - (negative)    Patient Stated Goals  "get back to my normal way of walking"    Currently in Pain?  Yes    Pain Score  7     Pain Location  Leg  Pain Orientation  Right;Left    Pain Descriptors / Indicators  Aching;Throbbing    Pain Type  Chronic pain    Pain Onset  More than a month ago    Pain Frequency  Constant    Aggravating Factors   cold, rainy weather    Pain Relieving Factors  medication helps          [facility] Adult PT Treatment/Exercise - 04/12/18 1152      Transfers   Transfers  Sit to Stand;Stand to Sit    Sit to Stand  5: Supervision;With upper extremity assist;From bed;From chair/3-in-1    Stand to Sit  5: Supervision;With upper extremity assist;To chair/3-in-1;To bed      Ambulation/Gait   Ambulation/Gait  Yes    Ambulation/Gait Assistance  5: Supervision;4: Min guard    Ambulation/Gait Assistance Details  use of cane into/out of lobby to/from gym. pt with stumbling/balance loss in lobby getting  up to come into gym. min guard assist provided with cane for safety due to decreased balance. trialed use of rollator in session today with only supervision needed due to new to pt for safety. Pt liked the stability and has agreed to persue one. Will submit request to MD for rollator order.                      Ambulation Distance (Feet)  230 Feet x1 indoors; 500 x1 in/outdoors    Assistive device  Straight cane;Rollator    Gait Pattern  Step-through pattern;Wide base of support;Decreased trunk rotation    Ambulation Surface  Level;Unlevel;Indoor;Outdoor;Paved          Balance Exercises - 04/12/18 1212      Balance Exercises: Standing   Standing Eyes Closed  Wide (BOA);Head turns;Foam/compliant surface;Other reps (comment);30 secs;Limitations    Balance Beam  standing with feet across blue foam beam: alternating fwd stepping to floor/back onto beam, then alternating backward stepping to floor/back onto beam. light UE support with min guard assist, cues on step length/height and weight shifting needed as well.     Sit to Stand Time  with feet on airex: sit<>stands x 8 reps with min (mod x2 due to posterior balance loss with 1st 2 reps), use of UE encouraged for controlled descent.       Balance Exercises: Standing   Standing Eyes Closed Limitations  on airex in parallel bars with feet hip width apart: EC no head movements, progressing to EC had movements left<>right, up<>down and diagonals both ways x 10 reps each. min guard to min assist for balance.              PT Long Term Goals - 04/03/18 1539      PT LONG TERM GOAL #1   Title  Improve Berg score by at least 5 points to reduce fall risk. (all STGs due by 04/27/18)    Baseline  04/03/18: baseline of 33/56 scored today    Time  4    Period  Weeks    Status  On-going      PT LONG TERM GOAL #2   Title  Improve TUG score to </= 14 secs with cane to demo improved functional mobility.    Baseline  17.12 secs with cane    Time  4     Period  Weeks    Status  On-going      PT LONG TERM GOAL #3   Title  Increase gait velocity from 2.46 ft/sec to >/=  2.8 ft/sec for increased gait efficiency.    Baseline  13.31 secs = 2.46 ft/sec on 03-28-18    Time  4    Period  Weeks    Status  On-going      PT LONG TERM GOAL #4   Title  Independent in HEP for balance exercises.    Time  4    Period  Weeks    Status  On-going            Plan - 04/12/18 1152    Clinical Impression Statement  Today's skilled session focused on use of rollator with gait with pt liking how it worked for her. Will send MD request for rollator order. Remainder of session continued to address balance with no issues reported. Pt is progressing toward goals and should benefit from continued PT to progress toward unmet goals.     Rehab Potential  Good    PT Frequency  2x / week    PT Duration  4 weeks    PT Treatment/Interventions  ADLs/Self Care Home Management;DME Instruction;Gait training;Stair training;Therapeutic activities;Therapeutic exercise;Balance training;Neuromuscular re-education;Patient/family education    PT Next Visit Plan  continue dynamic balance and gait training; check for rollator order    PT Home Exercise Plan  see above    Consulted and Agree with Plan of Care  Patient       Patient will benefit from skilled therapeutic intervention in order to improve the following deficits and impairments:  Abnormal gait, Decreased activity tolerance, Decreased balance, Decreased safety awareness, Decreased strength, Pain, Impaired vision/preception  Visit Diagnosis: Other abnormalities of gait and mobility  Unsteadiness on feet  Muscle weakness (generalized)     Problem List Patient Active Problem List   Diagnosis Date Noted  . Gait instability 01/03/2018  . Frequent bowel movements 11/02/2017  . Tremor of both hands   . Stuttering   . Essential hypertension   . Hyperlipidemia 08/13/2015  . Breast cancer of lower-outer  quadrant of right female breast ([place]) 02/13/2015  . Vitamin D deficiency 02/06/2015  . Type 2 diabetes mellitus with diabetic foot deformity ([place]) 01/01/2014  . Chronic combined systolic and diastolic heart failure ([place]) 02/26/2013  . OBESITY 02/05/2009  . Asthma, intermittent 02/08/2008  . NEPHROLITHIASIS 01/26/2007  . DJD (degenerative joint disease), multiple sites 01/26/2007    Willow Ora, PTA, [facility] [address], [place], [place] [address] [phone] 04/13/18, 57:84 PM   Name: [name] MRN: [id number] Date of Birth: 14-Nov-1951

## 2018-04-14 ENCOUNTER — Encounter: Payer: Self-pay | Admitting: Physical Therapy

## 2018-04-14 ENCOUNTER — Ambulatory Visit: Payer: Medicare Other | Admitting: Physical Therapy

## 2018-04-14 DIAGNOSIS — R2689 Other abnormalities of gait and mobility: Secondary | ICD-10-CM

## 2018-04-14 DIAGNOSIS — M6281 Muscle weakness (generalized): Secondary | ICD-10-CM

## 2018-04-14 DIAGNOSIS — R2681 Unsteadiness on feet: Secondary | ICD-10-CM

## 2018-04-16 ENCOUNTER — Telehealth: Payer: Self-pay | Admitting: Physical Therapy

## 2018-04-16 DIAGNOSIS — R2681 Unsteadiness on feet: Secondary | ICD-10-CM

## 2018-04-16 NOTE — Therapy (Signed)
[facility] [address] [place], [place], [address] Phone: [phone]   Fax:  [phone]  Physical Therapy Treatment  Patient Details  Name: [name] MRN: [id number] Date of Birth: 11-21-1951 Referring Provider: Talbert Cage, MD   Encounter Date: 04/14/2018   04/14/18 1408  PT Visits / Re-Eval  Visit Number 5  Number of Visits 9  Date for PT Re-Evaluation 04/27/18  Authorization  Authorization Type [employer] Medicare & Medicaid  Authorization Time Period 03-28-18 - 06-26-18  PT Time Calculation  PT Start Time 1403  PT Stop Time 1445  PT Time Calculation (min) 42 min  PT - End of Session  Equipment Utilized During Treatment Gait belt  Activity Tolerance Patient tolerated treatment well;Patient limited by fatigue  Behavior During Therapy [facility] for tasks assessed/performed     Past Medical History:  Diagnosis Date  . Anemia   . Arthritis   . Asthma   . Breast cancer of lower-outer quadrant of right female breast ([place]) 02/13/2015  . Cataract   . CHF, acute ([place]) 05/03/2012  . Depression   . Diabetes mellitus   . Eczema   . Fatty liver    Fatty infiltration of liver noted on 03/2012 CT scan  . Fibromyalgia   . GERD (gastroesophageal reflux disease)   . Heart disease   . History of kidney stones    w/ hx of hydronephrosis - followed by [facility]  . HIV nonspecific serology    2006: indeterminate HIV blood test, seen by ID, felt secondary to cross reacting antibodies with no further workup felt necessary at that time   . Hypertension   . Obesity   . Personal history of radiation therapy 2016   right  . Radiation 05/07/15-06/23/15   Right Breast    Past Surgical History:  Procedure Laterality Date  . BREAST BIOPSY Left 02/10/2015   malignant   . BREAST LUMPECTOMY Right 03/21/2015  . CHOLECYSTECTOMY  2003  . CYSTOSCOPY W/ LITHOLAPAXY / EHL    . JOINT REPLACEMENT     bilateral knee replacement   . LEFT AND RIGHT HEART CATHETERIZATION WITH CORONARY ANGIOGRAM N/A 05/02/2012   Procedure: LEFT AND RIGHT HEART CATHETERIZATION WITH CORONARY ANGIOGRAM;  Surgeon: Burnell Blanks, MD;  Location: [facility] CATH LAB;  Service: Cardiovascular;  Laterality: N/A;  . RADIOACTIVE SEED GUIDED PARTIAL MASTECTOMY WITH AXILLARY SENTINEL LYMPH NODE BIOPSY Right 03/21/2015   Procedure: RIGHT  PARTIAL MASTECTOMY WITH RADIOACTIVE SEED LOCALIZATION RIGHT  AXILLARY SENTINEL LYMPH NODE BIOPSY;  Surgeon: Fanny Skates, MD;  Location: [facility];  Service: General;  Laterality: Right;  . REPLACEMENT TOTAL KNEE BILATERAL  2005 &2006  . VASCULAR SURGERY Right 03/15/2013   Ultrasound guided sclerotherapy    There were no vitals filed for this visit.   04/14/18 1406  Symptoms/Limitations  Subjective No new complaints. No falls. "the usual" when asked about pain, mostly in her hands.   Pertinent History Breast cancer 2015:  Neuropathy; DM type 2    Diagnostic tests MRI - (negative)  Pain Assessment  Currently in Pain? Yes  Pain Score 8  Pain Orientation Right;Left  Pain Descriptors / Indicators Tightness;Tingling  Pain Type Neuropathic pain;Chronic pain  Pain Onset More than a month ago  Pain Frequency Constant  Aggravating Factors  cold, rainy weather  Pain Relieving Factors medication helps      04/14/18 1409  Transfers  Transfers Sit to Stand;Stand to Sit  Sit to Stand 5: Supervision;With upper  extremity assist;From bed;From chair/3-in-1  Stand to Sit 5: Supervision;With upper extremity assist;To chair/3-in-1;To bed  Ambulation/Gait  Ambulation/Gait Yes  Ambulation/Gait Assistance 5: Supervision  Ambulation/Gait Assistance Details continued with use of rollator at supervision level  Ambulation Distance (Feet) 220 Feet (x1, 500 x1 in/outdoors, + around gym)  Assistive device Straight cane;Rollator  Gait Pattern Step-through pattern;Wide base of support;Decreased trunk rotation   Ambulation Surface Level;Indoor  Ramp 5: Supervision  Ramp Details (indicate cue type and reason) on outdoor inclines/declines with rollator, cues on use of brakes to slow it down  Curb Other (comment) (min guard assist with rollator)  Curb Details (indicate cue type and reason) cues on use of brakes and sequencing.   High Level Balance  High Level Balance Activities Side stepping;Marching forwards;Marching backwards;Tandem walking  High Level Balance Comments on red mats next to counter top: UE support as needed for 3 laps each with min guard to min assist for balance. cues on posture and ex form/technique      04/14/18 1442  Balance Exercises: Standing  Standing Eyes Closed Wide (BOA);Head turns;Foam/compliant surface;Other reps (comment);30 secs;Limitations  SLS with Vectors Foam/compliant surface;Other reps (comment);Limitations  Balance Exercises: Standing  SLS with Vectors Limitations on airex with 2 tall cones in front, light finger tip support: alternating fwd toe taps with min to mod assist, then alternating cross toe taps with one significant loss of balance with cross taps needing assist to sit safely on mat table. cues needed to slow down, weight shift and keep a wide base of support.   Standing Eyes Closed Limitations on airex next to counter with feet apart, light to no UE support: EC no head movements , progressing to EC head movements. min assist for balance.       PT Long Term Goals - 04/03/18 1539      PT LONG TERM GOAL #1   Title  Improve Berg score by at least 5 points to reduce fall risk. (all STGs due by 04/27/18)    Baseline  04/03/18: baseline of 33/56 scored today    Time  4    Period  Weeks    Status  On-going      PT LONG TERM GOAL #2   Title  Improve TUG score to </= 14 secs with cane to demo improved functional mobility.    Baseline  17.12 secs with cane    Time  4    Period  Weeks    Status  On-going      PT LONG TERM GOAL #3   Title  Increase gait  velocity from 2.46 ft/sec to >/= 2.8 ft/sec for increased gait efficiency.    Baseline  13.31 secs = 2.46 ft/sec on 03-28-18    Time  4    Period  Weeks    Status  On-going      PT LONG TERM GOAL #4   Title  Independent in HEP for balance exercises.    Time  4    Period  Weeks    Status  On-going         04/14/18 1408  Plan  Clinical Impression Statement Today's skilled session continued to focus on use of rollator for gait and on barriers such as curb/ramps. Pt continues to improve with use of rollator. Remainder of session continued to address balance reactions with no issues reported. Pt does continue to be challenged by compliant surfaces. Pt is progressing toward goals and should benefit from continued PT to progress toward  unmet goals.              Pt will benefit from skilled therapeutic intervention in order to improve on the following deficits Abnormal gait;Decreased activity tolerance;Decreased balance;Decreased safety awareness;Decreased strength;Pain;Impaired vision/preception  Rehab Potential Good  PT Frequency 2x / week  PT Duration 4 weeks  PT Treatment/Interventions ADLs/Self Care Home Management;DME Instruction;Gait training;Stair training;Therapeutic activities;Therapeutic exercise;Balance training;Neuromuscular re-education;Patient/family education  PT Next Visit Plan continue dynamic balance and gait training; check for rollator order  PT Home Exercise Plan see above  Consulted and Agree with Plan of Care Patient       Patient will benefit from skilled therapeutic intervention in order to improve the following deficits and impairments:  Abnormal gait, Decreased activity tolerance, Decreased balance, Decreased safety awareness, Decreased strength, Pain, Impaired vision/preception  Visit Diagnosis: Other abnormalities of gait and mobility  Unsteadiness on feet  Muscle weakness (generalized)     Problem List Patient Active Problem List   Diagnosis Date  Noted  . Gait instability 01/03/2018  . Frequent bowel movements 11/02/2017  . Tremor of both hands   . Stuttering   . Essential hypertension   . Hyperlipidemia 08/13/2015  . Breast cancer of lower-outer quadrant of right female breast ([place]) 02/13/2015  . Vitamin D deficiency 02/06/2015  . Type 2 diabetes mellitus with diabetic foot deformity ([place]) 01/01/2014  . Chronic combined systolic and diastolic heart failure ([place]) 02/26/2013  . OBESITY 02/05/2009  . Asthma, intermittent 02/08/2008  . NEPHROLITHIASIS 01/26/2007  . DJD (degenerative joint disease), multiple sites 01/26/2007    Willow Ora, PTA, [facility] [address] [place], [place] [address] [phone] 04/16/18, 9:41 PM   Name: [name] MRN: [id number] Date of Birth: 1951-08-17

## 2018-04-16 NOTE — Telephone Encounter (Signed)
[name],  [name] is being treated by physical therapy for falls and LE weakness.  she will benefit from use of an rollator RW in order to improve safety with functional mobility/gait.   If you agree, please submit request in EPIC under referral for DME (list rollator RW in comments) or fax to [facility] at [phone].   Thank you,  Willow Ora, PTA, [facility] [address], [place] [address], [place] [address] [phone] 04/16/18, 3:05 [place] [address] [place], [place]  [address] Phone:  [phone] Fax:  [phone]

## 2018-04-17 ENCOUNTER — Encounter: Payer: Self-pay | Admitting: Physical Therapy

## 2018-04-17 ENCOUNTER — Ambulatory Visit: Payer: Medicare Other | Admitting: Physical Therapy

## 2018-04-17 DIAGNOSIS — R2689 Other abnormalities of gait and mobility: Secondary | ICD-10-CM | POA: Diagnosis not present

## 2018-04-17 DIAGNOSIS — R2681 Unsteadiness on feet: Secondary | ICD-10-CM

## 2018-04-17 DIAGNOSIS — M6281 Muscle weakness (generalized): Secondary | ICD-10-CM

## 2018-04-17 NOTE — Therapy (Signed)
[facility] [address] [place], [place], [address] Phone: [phone]   Fax:  [phone]  Physical Therapy Treatment  Patient Details  Name: [name] MRN: [id number] Date of Birth: [birth year] Referring Provider: Talbert Cage, MD   Encounter Date: 04/17/2018  PT End of Session - 04/17/18 1720    Visit Number  6    Number of Visits  9    Date for PT Re-Evaluation  04/27/18    Authorization Type  UHC Medicare & Medicaid    Authorization Time Period  03-28-18 - 06-26-18    PT Start Time  0932    PT Stop Time  1016    PT Time Calculation (min)  44 min    Equipment Utilized During Treatment  Gait belt       Past Medical History:  Diagnosis Date  . Anemia   . Arthritis   . Asthma   . Breast cancer of lower-outer quadrant of right female breast ([place]) 02/13/2015  . Cataract   . CHF, acute ([place]) 05/03/2012  . Depression   . Diabetes mellitus   . Eczema   . Fatty liver    Fatty infiltration of liver noted on 03/2012 CT scan  . Fibromyalgia   . GERD (gastroesophageal reflux disease)   . Heart disease   . History of kidney stones    w/ hx of hydronephrosis - followed by [facility]  . HIV nonspecific serology    2006: indeterminate HIV blood test, seen by ID, felt secondary to cross reacting antibodies with no further workup felt necessary at that time   . Hypertension   . Obesity   . Personal history of radiation therapy 2016   right  . Radiation 05/07/15-06/23/15   Right Breast    Past Surgical History:  Procedure Laterality Date  . BREAST BIOPSY Left 02/10/2015   malignant   . BREAST LUMPECTOMY Right 03/21/2015  . CHOLECYSTECTOMY  2003  . CYSTOSCOPY W/ LITHOLAPAXY / EHL    . JOINT REPLACEMENT     bilateral knee replacement  . LEFT AND RIGHT HEART CATHETERIZATION WITH CORONARY ANGIOGRAM N/A 05/02/2012   Procedure: LEFT AND RIGHT HEART CATHETERIZATION WITH CORONARY ANGIOGRAM;  Surgeon:  Burnell Blanks, MD;  Location: [facility] CATH LAB;  Service: Cardiovascular;  Laterality: N/A;  . RADIOACTIVE SEED GUIDED PARTIAL MASTECTOMY WITH AXILLARY SENTINEL LYMPH NODE BIOPSY Right 03/21/2015   Procedure: RIGHT  PARTIAL MASTECTOMY WITH RADIOACTIVE SEED LOCALIZATION RIGHT  AXILLARY SENTINEL LYMPH NODE BIOPSY;  Surgeon: Fanny Skates, MD;  Location: [facility];  Service: General;  Laterality: Right;  . REPLACEMENT TOTAL KNEE BILATERAL  2005 &2006  . VASCULAR SURGERY Right 03/15/2013   Ultrasound guided sclerotherapy    There were no vitals filed for this visit.  Subjective Assessment - 04/17/18 1713    Subjective  "my hands have got me today" - states she had very difficult time gripping things over the weekend    Pertinent History  Breast cancer 2015:  Neuropathy; DM type 2      Diagnostic tests  MRI - (negative)    Patient Stated Goals  "get back to my normal way of walking"    Currently in Pain?  Yes    Pain Score  7     Pain Location  Hand    Pain Orientation  Right;Left    Pain Descriptors / Indicators  Aching;Grimacing;Discomfort    Pain Type  Chronic pain    Pain Onset  More than a month ago    Pain Frequency  Constant                       [facility] Adult PT Treatment/Exercise - 04/17/18 0946      Transfers   Transfers  Sit to Stand;Stand to Sit    Sit to Stand  5: Supervision    Stand to Sit  4: Min guard    Number of Reps  10 reps 3 reps feet on floor:  7 reps feet on Airex    Comments  on blue AIrex; no UE support      Ambulation/Gait   Ambulation/Gait  Yes    Ambulation/Gait Assistance  4: Min guard    Ambulation/Gait Assistance Details  pt had 2 occurrences of Lt toes catching floor , requiring min assist for recovery of LOB- possibly due to newly waxed floors    Ambulation Distance (Feet)  350 Feet    Assistive device  4-wheeled walker    Gait Pattern  Step-through pattern    Ambulation Surface  Level;Indoor      Exercises    Exercises  Ankle      Ankle Exercises: Standing   Heel Raises  Both;10 reps    Toe Raise  10 reps      NeuroRe-ed:  Pt performed stepping strategy - anterior/posterior and laterally 10 reps each leg - with UE support prn inside // bars  Sidestepping with squats - 10' x 2 reps inside // bars  Marching in place 10 reps each leg; marching forwards along counter with UE support x 2 reps with CGA Stepping over and back of orange hurdle 5 reps each leg with UE support on counter with min assist Tap ups to 4" step with UE support prn inside // bars            PT Long Term Goals - 04/03/18 1539      PT LONG TERM GOAL #1   Title  Improve Berg score by at least 5 points to reduce fall risk. (all STGs due by 04/27/18)    Baseline  04/03/18: baseline of 33/56 scored today    Time  4    Period  Weeks    Status  On-going      PT LONG TERM GOAL #2   Title  Improve TUG score to </= 14 secs with cane to demo improved functional mobility.    Baseline  17.12 secs with cane    Time  4    Period  Weeks    Status  On-going      PT LONG TERM GOAL #3   Title  Increase gait velocity from 2.46 ft/sec to >/= 2.8 ft/sec for increased gait efficiency.    Baseline  13.31 secs = 2.46 ft/sec on 03-28-18    Time  4    Period  Weeks    Status  On-going      PT LONG TERM GOAL #4   Title  Independent in HEP for balance exercises.    Time  4    Period  Weeks    Status  On-going            Plan - 04/17/18 1721    Clinical Impression Statement  Pt had more unsteadiness today during gait and also with dynamic standing balance activities.  Pt became frustrated with LOB with standing activities.  Pt had 2 occurrences of Lt foot "catching" floor during gait training -  possibly partially due to newly waxed floors as well as due to decr. dorsiflexion     Rehab Potential  Good    PT Frequency  2x / week    PT Duration  4 weeks    PT Treatment/Interventions  ADLs/Self Care Home Management;DME  Instruction;Gait training;Stair training;Therapeutic activities;Therapeutic exercise;Balance training;Neuromuscular re-education;Patient/family education    PT Next Visit Plan  continue dynamic balance and gait training; order for RW given to patient to take to [place]  see above    Consulted and Agree with Plan of Care  Patient       Patient will benefit from skilled therapeutic intervention in order to improve the following deficits and impairments:  Abnormal gait, Decreased activity tolerance, Decreased balance, Decreased safety awareness, Decreased strength, Pain, Impaired vision/preception  Visit Diagnosis: Other abnormalities of gait and mobility  Unsteadiness on feet  Muscle weakness (generalized)     Problem List Patient Active Problem List   Diagnosis Date Noted  . Gait instability 01/03/2018  . Frequent bowel movements 11/02/2017  . Tremor of both hands   . Stuttering   . Essential hypertension   . Hyperlipidemia 08/13/2015  . Breast cancer of lower-outer quadrant of right female breast ([place]) 02/13/2015  . Vitamin D deficiency 02/06/2015  . Type 2 diabetes mellitus with diabetic foot deformity ([place]) 01/01/2014  . Chronic combined systolic and diastolic heart failure ([place]) 02/26/2013  . OBESITY 02/05/2009  . Asthma, intermittent 02/08/2008  . NEPHROLITHIASIS 01/26/2007  . DJD (degenerative joint disease), multiple sites 01/26/2007    Alda Lea, PT 04/17/2018, 5:36 PM  [facility] [address] [place], [place], [address] Phone: [phone]   Fax:  [phone]  Name: [name] MRN: [id number] Date of Birth: August 19, 1951

## 2018-04-17 NOTE — Telephone Encounter (Signed)
[name]  Order entered  Please let me know if I need to do anything else  Thanks for your help  [name]

## 2018-04-19 ENCOUNTER — Ambulatory Visit: Payer: Medicare Other | Admitting: Physical Therapy

## 2018-04-19 ENCOUNTER — Encounter: Payer: Self-pay | Admitting: Physical Therapy

## 2018-04-19 DIAGNOSIS — M6281 Muscle weakness (generalized): Secondary | ICD-10-CM | POA: Diagnosis not present

## 2018-04-19 DIAGNOSIS — R2689 Other abnormalities of gait and mobility: Secondary | ICD-10-CM

## 2018-04-19 DIAGNOSIS — R2681 Unsteadiness on feet: Secondary | ICD-10-CM | POA: Diagnosis not present

## 2018-04-20 NOTE — Therapy (Signed)
[facility] [address] [place], [place], [address] Phone: [phone]   Fax:  [phone]  Physical Therapy Treatment  Patient Details  Name: [name] MRN: [id number] Date of Birth: 08-25-1951 Referring Provider: Talbert Cage, MD   Encounter Date: 04/19/2018  PT End of Session - 04/19/18 1024    Visit Number  7    Number of Visits  9    Date for PT Re-Evaluation  04/27/18    Authorization Type  [employer] Medicare & Medicaid    Authorization Time Period  03-28-18 - 06-26-18    PT Start Time  1019    PT Stop Time  1057    PT Time Calculation (min)  38 min    Equipment Utilized During Treatment  Gait belt    Activity Tolerance  Patient tolerated treatment well    Behavior During Therapy  WFL for tasks assessed/performed       Past Medical History:  Diagnosis Date  . Anemia   . Arthritis   . Asthma   . Breast cancer of lower-outer quadrant of right female breast ([place]) 02/13/2015  . Cataract   . CHF, acute ([place]) 05/03/2012  . Depression   . Diabetes mellitus   . Eczema   . Fatty liver    Fatty infiltration of liver noted on 03/2012 CT scan  . Fibromyalgia   . GERD (gastroesophageal reflux disease)   . Heart disease   . History of kidney stones    w/ hx of hydronephrosis - followed by [facility]  . HIV nonspecific serology    2006: indeterminate HIV blood test, seen by ID, felt secondary to cross reacting antibodies with no further workup felt necessary at that time   . Hypertension   . Obesity   . Personal history of radiation therapy 2016   right  . Radiation 05/07/15-06/23/15   Right Breast    Past Surgical History:  Procedure Laterality Date  . BREAST BIOPSY Left 02/10/2015   malignant   . BREAST LUMPECTOMY Right 03/21/2015  . CHOLECYSTECTOMY  2003  . CYSTOSCOPY W/ LITHOLAPAXY / EHL    . JOINT REPLACEMENT     bilateral knee replacement  . LEFT AND RIGHT HEART CATHETERIZATION WITH CORONARY  ANGIOGRAM N/A 05/02/2012   Procedure: LEFT AND RIGHT HEART CATHETERIZATION WITH CORONARY ANGIOGRAM;  Surgeon: Burnell Blanks, MD;  Location: [facility] CATH LAB;  Service: Cardiovascular;  Laterality: N/A;  . RADIOACTIVE SEED GUIDED PARTIAL MASTECTOMY WITH AXILLARY SENTINEL LYMPH NODE BIOPSY Right 03/21/2015   Procedure: RIGHT  PARTIAL MASTECTOMY WITH RADIOACTIVE SEED LOCALIZATION RIGHT  AXILLARY SENTINEL LYMPH NODE BIOPSY;  Surgeon: Fanny Skates, MD;  Location: [facility];  Service: General;  Laterality: Right;  . REPLACEMENT TOTAL KNEE BILATERAL  2005 &2006  . VASCULAR SURGERY Right 03/15/2013   Ultrasound guided sclerotherapy    There were no vitals filed for this visit.  Subjective Assessment - 04/19/18 1020    Subjective  Reports feeling "stiff" today. Had difficulty getting out of lobby chair and needed min assist to walk to gym with cane today. Reports her son is taking her to get the rollator from [place] today.     Pertinent History  Breast cancer 2015:  Neuropathy; DM type 2      Diagnostic tests  MRI - (negative)    Currently in Pain?  Yes    Pain Score  7     Pain Location  Hand  Pain Orientation  Right;Left    Pain Descriptors / Indicators  Aching;Grimacing;Discomfort    Pain Type  Chronic pain    Pain Onset  More than a month ago    Pain Frequency  Constant    Aggravating Factors   cold, rainy weather    Pain Relieving Factors  medication helps, wearing compression gloves    Multiple Pain Sites  Yes    Pain Score  8    Pain Location  Knee    Pain Orientation  Right;Left    Pain Descriptors / Indicators  Aching;Discomfort;Sharp    Pain Type  Chronic pain    Pain Onset  More than a month ago    Pain Frequency  Constant    Aggravating Factors   cold weather, sometimes increased activity    Pain Relieving Factors  nothing so far           [facility] Adult PT Treatment/Exercise - 04/19/18 1025      Transfers   Transfers  Sit to Stand;Stand to  Sit    Sit to Stand  5: Supervision;With upper extremity assist;From bed;From chair/3-in-1    Stand to Sit  5: Supervision;4: Min guard;With upper extremity assist;To bed;To chair/3-in-1    Number of Reps  10 reps;1 set    Comments  on blue airex with no UE suport. mild increase in pain in knees, eased off with seated rest break.       Ambulation/Gait   Ambulation/Gait  Yes    Ambulation/Gait Assistance  5: Supervision;4: Min guard;4: Min assist    Ambulation/Gait Assistance Details  2 episodes of toe catching with 1st gait lap needing up to min assist to correct balance with fist one, pt self correcting on 2cd one. no toe suffing with 2cd gait lap indoor/outdoors combined.                       Ambulation Distance (Feet)  230 Feet x1, 600 x1    Assistive device  Rollator;Straight cane    Gait Pattern  Step-through pattern;Decreased stride length;Trunk flexed;Poor foot clearance - left    Ambulation Surface  Level;Indoor;Unlevel;Outdoor;Paved    Ramp  5: Supervision    Ramp Details (indicate cue type and reason)  on outdoor inclines/declines. no foot scuffing, demo's safe use of rollator brakes    Gait Comments  min assist with cane to enter/exit gym from/to lobby. rollator used for session.       Knee/Hip Exercises: Aerobic   Nustep  UE/LE's at level 2 with goal >/= 55 steps per minute for strengthening and activity tolerance x 10 minutes. cues on posture at times. no increase in pain reported.             PT Long Term Goals - 04/03/18 1539      PT LONG TERM GOAL #1   Title  Improve Berg score by at least 5 points to reduce fall risk. (all STGs due by 04/27/18)    Baseline  04/03/18: baseline of 33/56 scored today    Time  4    Period  Weeks    Status  On-going      PT LONG TERM GOAL #2   Title  Improve TUG score to </= 14 secs with cane to demo improved functional mobility.    Baseline  17.12 secs with cane    Time  4    Period  Weeks    Status  On-going  PT LONG TERM  GOAL #3   Title  Increase gait velocity from 2.46 ft/sec to >/= 2.8 ft/sec for increased gait efficiency.    Baseline  13.31 secs = 2.46 ft/sec on 03-28-18    Time  4    Period  Weeks    Status  On-going      PT LONG TERM GOAL #4   Title  Independent in HEP for balance exercises.    Time  4    Period  Weeks    Status  On-going         Plan - 04/19/18 1024    Clinical Impression Statement  Today's skilled session continued to focus on gait with rollator and overall strengthening. Pt presented more off balance today with cane, needing at least min assist for balance. She reports her son is taking her today to get the rollator. She is making steady progress toward goals and should benefit from continued PT to progress toward unmet goals.     Rehab Potential  Good    PT Frequency  2x / week    PT Duration  4 weeks    PT Treatment/Interventions  ADLs/Self Care Home Management;DME Instruction;Gait training;Stair training;Therapeutic activities;Therapeutic exercise;Balance training;Neuromuscular re-education;Patient/family education    PT Next Visit Plan  continue dynamic balance and gait training;    PT Home Exercise Plan  see above    Consulted and Agree with Plan of Care  Patient       Patient will benefit from skilled therapeutic intervention in order to improve the following deficits and impairments:  Abnormal gait, Decreased activity tolerance, Decreased balance, Decreased safety awareness, Decreased strength, Pain, Impaired vision/preception  Visit Diagnosis: Other abnormalities of gait and mobility  Unsteadiness on feet  Muscle weakness (generalized)     Problem List Patient Active Problem List   Diagnosis Date Noted  . Gait instability 01/03/2018  . Frequent bowel movements 11/02/2017  . Tremor of both hands   . Stuttering   . Essential hypertension   . Hyperlipidemia 08/13/2015  . Breast cancer of lower-outer quadrant of right female breast ([place]) 02/13/2015  .  Vitamin D deficiency 02/06/2015  . Type 2 diabetes mellitus with diabetic foot deformity ([place]) 01/01/2014  . Chronic combined systolic and diastolic heart failure ([place]) 02/26/2013  . OBESITY 02/05/2009  . Asthma, intermittent 02/08/2008  . NEPHROLITHIASIS 01/26/2007  . DJD (degenerative joint disease), multiple sites 01/26/2007    Willow Ora, PTA, [facility] [address], [place], [place] [address] [phone] 04/20/18, 3:50 PM   Name: [name] MRN: [id number] Date of Birth: [birth year]

## 2018-04-25 ENCOUNTER — Ambulatory Visit: Payer: Medicare Other | Admitting: Physical Therapy

## 2018-04-25 DIAGNOSIS — R2681 Unsteadiness on feet: Secondary | ICD-10-CM | POA: Diagnosis not present

## 2018-04-25 DIAGNOSIS — M6281 Muscle weakness (generalized): Secondary | ICD-10-CM | POA: Diagnosis not present

## 2018-04-25 DIAGNOSIS — R2689 Other abnormalities of gait and mobility: Secondary | ICD-10-CM | POA: Diagnosis not present

## 2018-04-26 ENCOUNTER — Encounter: Payer: Self-pay | Admitting: Physical Therapy

## 2018-04-26 DIAGNOSIS — H401133 Primary open-angle glaucoma, bilateral, severe stage: Secondary | ICD-10-CM | POA: Diagnosis not present

## 2018-04-26 DIAGNOSIS — H10413 Chronic giant papillary conjunctivitis, bilateral: Secondary | ICD-10-CM | POA: Diagnosis not present

## 2018-04-26 NOTE — Therapy (Signed)
[facility] [address] [place], [place], [address] Phone: [phone]   Fax:  [phone]  Physical Therapy Treatment  Patient Details  Name: [name] MRN: [id number] Date of Birth: 09-29-1951 Referring Provider: Talbert Cage, MD   Encounter Date: 04/25/2018  PT End of Session - 04/26/18 1014    Visit Number  8    Number of Visits  9    Date for PT Re-Evaluation  04/27/18    Authorization Type  UHC Medicare & Medicaid    Authorization Time Period  03-28-18 - 06-26-18    PT Start Time  1102    PT Stop Time  1146    PT Time Calculation (min)  44 min    Equipment Utilized During Treatment  Gait belt       Past Medical History:  Diagnosis Date  . Anemia   . Arthritis   . Asthma   . Breast cancer of lower-outer quadrant of right female breast ([place]) 02/13/2015  . Cataract   . CHF, acute ([place]) 05/03/2012  . Depression   . Diabetes mellitus   . Eczema   . Fatty liver    Fatty infiltration of liver noted on 03/2012 CT scan  . Fibromyalgia   . GERD (gastroesophageal reflux disease)   . Heart disease   . History of kidney stones    w/ hx of hydronephrosis - followed by [facility]  . HIV nonspecific serology    2006: indeterminate HIV blood test, seen by ID, felt secondary to cross reacting antibodies with no further workup felt necessary at that time   . Hypertension   . Obesity   . Personal history of radiation therapy 2016   right  . Radiation 05/07/15-06/23/15   Right Breast    Past Surgical History:  Procedure Laterality Date  . BREAST BIOPSY Left 02/10/2015   malignant   . BREAST LUMPECTOMY Right 03/21/2015  . CHOLECYSTECTOMY  2003  . CYSTOSCOPY W/ LITHOLAPAXY / EHL    . JOINT REPLACEMENT     bilateral knee replacement  . LEFT AND RIGHT HEART CATHETERIZATION WITH CORONARY ANGIOGRAM N/A 05/02/2012   Procedure: LEFT AND RIGHT HEART CATHETERIZATION WITH CORONARY ANGIOGRAM;  Surgeon:  Burnell Blanks, MD;  Location[facility] CATH LAB;  Service: Cardiovascular;  Laterality: N/A;  . RADIOACTIVE SEED GUIDED PARTIAL MASTECTOMY WITH AXILLARY SENTINEL LYMPH NODE BIOPSY Right 03/21/2015   Procedure: RIGHT  PARTIAL MASTECTOMY WITH RADIOACTIVE SEED LOCALIZATION RIGHT  AXILLARY SENTINEL LYMPH NODE BIOPSY;  Surgeon: Fanny Skates, MD;  Location: [facility];  Service: General;  Laterality: Right;  . REPLACEMENT TOTAL KNEE BILATERAL  2005 &2006  . VASCULAR SURGERY Right 03/15/2013   Ultrasound guided sclerotherapy    There were no vitals filed for this visit.  Subjective Assessment - 04/26/18 0955    Subjective  Pt has obtained rollator - states she got it last Wed. after PT session - was able to load it in her car Sunday when she went to church - states it was a little difficult but she did it by herself    Pertinent History  Breast cancer 2015:  Neuropathy; DM type 2      Diagnostic tests  MRI - (negative)    Patient Stated Goals  "get back to my normal way of walking"                       [facility] Adult PT Treatment/Exercise - 04/26/18  0001      Transfers   Transfers  Sit to Stand;Stand to Sit    Sit to Stand  5: Supervision;With upper extremity assist;From bed;From chair/3-in-1    Stand to Sit  5: Supervision;4: Min guard;With upper extremity assist;To bed;To chair/3-in-1    Number of Reps  Other reps (comment) 5    Comments  feet on floor      Ambulation/Gait   Ambulation/Gait  Yes    Ambulation/Gait Assistance  5: Supervision    Ambulation/Gait Assistance Details  2 occurrences of toes catching floor requiring  min assist to prevent LOB    Ambulation Distance (Feet)  230 Feet    Assistive device  Rollator    Gait Pattern  Step-through pattern;Decreased stride length;Trunk flexed;Poor foot clearance - left    Ambulation Surface  Level;Indoor    Ramp  5: Supervision no device used      High Level Balance   High Level Balance Activities   Side stepping          Balance Exercises - 04/26/18 1013      Balance Exercises: Standing   Rockerboard  Anterior/posterior;EO;10 reps;UE support    Other Standing Exercises  Pt performed forward, back and side kicks 10 reps each inside // bars with UE support ; marching in place 10 reps each leg with UEs upport;  marching forward 2 reps with CGA with 1 UE support;  pt performed alternate tap ups to 1st step with UE support , then without UE support 10 reps each leg - had moderate LOB without UE support; performed tpa ups to 2nd step with light UE support on rail 10 reps each leg                              Pt performed stepping over and back of balance beam - with UE support on // bar prn 5 reps each leg Pt stood on incline at top - with cues to focus on object straight ahead - with CGA for safety - some mild unsteadiness occurred      PT Long Term Goals - 04/03/18 1539      PT LONG TERM GOAL #1   Title  Improve Berg score by at least 5 points to reduce fall risk. (all STGs due by 04/27/18)    Baseline  04/03/18: baseline of 33/56 scored today    Time  4    Period  Weeks    Status  On-going      PT LONG TERM GOAL #2   Title  Improve TUG score to </= 14 secs with cane to demo improved functional mobility.    Baseline  17.12 secs with cane    Time  4    Period  Weeks    Status  On-going      PT LONG TERM GOAL #3   Title  Increase gait velocity from 2.46 ft/sec to >/= 2.8 ft/sec for increased gait efficiency.    Baseline  13.31 secs = 2.46 ft/sec on 03-28-18    Time  4    Period  Weeks    Status  On-going      PT LONG TERM GOAL #4   Title  Independent in HEP for balance exercises.    Time  4    Period  Weeks    Status  On-going            Plan - 04/26/18  1057    Clinical Impression Statement  Pt is safer with ambulation with use of rollator but continues to catch toes, resulting in some LOB with min guard for recovery.  Pt is at risk for fall due to this  occurring.     Rehab Potential  Good    PT Frequency  2x / week    PT Duration  4 weeks    PT Treatment/Interventions  ADLs/Self Care Home Management;DME Instruction;Gait training;Stair training;Therapeutic activities;Therapeutic exercise;Balance training;Neuromuscular re-education;Patient/family education    PT Next Visit Plan  check goals - renew    PT Home Exercise Plan  see above    Consulted and Agree with Plan of Care  Patient       Patient will benefit from skilled therapeutic intervention in order to improve the following deficits and impairments:  Abnormal gait, Decreased activity tolerance, Decreased balance, Decreased safety awareness, Decreased strength, Pain, Impaired vision/preception  Visit Diagnosis: Other abnormalities of gait and mobility  Unsteadiness on feet     Problem List Patient Active Problem List   Diagnosis Date Noted  . Gait instability 01/03/2018  . Frequent bowel movements 11/02/2017  . Tremor of both hands   . Stuttering   . Essential hypertension   . Hyperlipidemia 08/13/2015  . Breast cancer of lower-outer quadrant of right female breast ([place]) 02/13/2015  . Vitamin D deficiency 02/06/2015  . Type 2 diabetes mellitus with diabetic foot deformity ([place]) 01/01/2014  . Chronic combined systolic and diastolic heart failure ([place]) 02/26/2013  . OBESITY 02/05/2009  . Asthma, intermittent 02/08/2008  . NEPHROLITHIASIS 01/26/2007  . DJD (degenerative joint disease), multiple sites 01/26/2007    Alda Lea, PT 04/26/2018, 11:02 AM  [facility] [address] [place], [place], [address] Phone: [phone]   Fax:  [phone]  Name: [name] MRN: [id number] Date of Birth: [birth year]

## 2018-04-27 ENCOUNTER — Encounter: Payer: Self-pay | Admitting: Physical Therapy

## 2018-04-27 ENCOUNTER — Ambulatory Visit: Payer: Medicare Other | Admitting: Physical Therapy

## 2018-04-27 DIAGNOSIS — R2689 Other abnormalities of gait and mobility: Secondary | ICD-10-CM

## 2018-04-27 DIAGNOSIS — R2681 Unsteadiness on feet: Secondary | ICD-10-CM

## 2018-04-27 DIAGNOSIS — M6281 Muscle weakness (generalized): Secondary | ICD-10-CM | POA: Diagnosis not present

## 2018-04-27 NOTE — Therapy (Signed)
[facility] [address] [place], [place], [address] Phone: [phone]   Fax:  [phone]  Physical Therapy Treatment  Patient Details  Name: [name] MRN: [id number] Date of Birth: 02/19/1951 Referring Provider: Talbert Cage, MD   Encounter Date: 04/27/2018  PT End of Session - 04/27/18 1144    Visit Number  9    Number of Visits  17    Date for PT Re-Evaluation  05/28/18    Authorization Type  UHC Medicare & Medicaid    Authorization Time Period  03-28-18 - 06-26-18; 04-27-18 - 06-27-18    PT Start Time  1020    PT Stop Time  1106    PT Time Calculation (min)  46 min       Past Medical History:  Diagnosis Date  . Anemia   . Arthritis   . Asthma   . Breast cancer of lower-outer quadrant of right female breast ([place]) 02/13/2015  . Cataract   . CHF, acute ([place]) 05/03/2012  . Depression   . Diabetes mellitus   . Eczema   . Fatty liver    Fatty infiltration of liver noted on 03/2012 CT scan  . Fibromyalgia   . GERD (gastroesophageal reflux disease)   . Heart disease   . History of kidney stones    w/ hx of hydronephrosis - followed by [facility]  . HIV nonspecific serology    2006: indeterminate HIV blood test, seen by ID, felt secondary to cross reacting antibodies with no further workup felt necessary at that time   . Hypertension   . Obesity   . Personal history of radiation therapy 2016   right  . Radiation 05/07/15-06/23/15   Right Breast    Past Surgical History:  Procedure Laterality Date  . BREAST BIOPSY Left 02/10/2015   malignant   . BREAST LUMPECTOMY Right 03/21/2015  . CHOLECYSTECTOMY  2003  . CYSTOSCOPY W/ LITHOLAPAXY / EHL    . JOINT REPLACEMENT     bilateral knee replacement  . LEFT AND RIGHT HEART CATHETERIZATION WITH CORONARY ANGIOGRAM N/A 05/02/2012   Procedure: LEFT AND RIGHT HEART CATHETERIZATION WITH CORONARY ANGIOGRAM;  Surgeon: Burnell Blanks, MD;  Location:  [facility] CATH LAB;  Service: Cardiovascular;  Laterality: N/A;  . RADIOACTIVE SEED GUIDED PARTIAL MASTECTOMY WITH AXILLARY SENTINEL LYMPH NODE BIOPSY Right 03/21/2015   Procedure: RIGHT  PARTIAL MASTECTOMY WITH RADIOACTIVE SEED LOCALIZATION RIGHT  AXILLARY SENTINEL LYMPH NODE BIOPSY;  Surgeon: Fanny Skates, MD;  Location: [facility];  Service: General;  Laterality: Right;  . REPLACEMENT TOTAL KNEE BILATERAL  2005 &2006  . VASCULAR SURGERY Right 03/15/2013   Ultrasound guided sclerotherapy    There were no vitals filed for this visit.  Subjective Assessment - 04/27/18 1139    Subjective  Pt states she wants to get rid of rollator - states "I'm using it but I don't like it"    Pertinent History  Breast cancer 2015:  Neuropathy; DM type 2      Diagnostic tests  MRI - (negative)    Patient Stated Goals  "get back to my normal way of walking"         [facility] PT Assessment - 04/27/18 1034      Berg Balance Test   Sit to Stand  Able to stand without using hands and stabilize independently    Standing Unsupported  Able to stand safely 2 minutes    Sitting with Back Unsupported but Feet  Supported on Floor or Stool  Able to sit safely and securely 2 minutes    Stand to Sit  Sits safely with minimal use of hands    Transfers  Able to transfer safely, minor use of hands    Standing Unsupported with Eyes Closed  Able to stand 10 seconds safely    Standing Ubsupported with Feet Together  Able to place feet together independently and stand for 1 minute with supervision    From Standing, Reach Forward with Outstretched Arm  Can reach confidently >25 cm (10")    From Standing Position, Pick up Object from Floor  Able to pick up shoe, needs supervision    From Standing Position, Turn to Look Behind Over each Shoulder  Looks behind one side only/other side shows less weight shift    Turn 360 Degrees  Able to turn 360 degrees safely but slowly    Standing Unsupported, Alternately Place Feet on  Step/Stool  Able to complete 4 steps without aid or supervision    Standing Unsupported, One Foot in Front  Able to take small step independently and hold 30 seconds    Standing on One Leg  Tries to lift leg/unable to hold 3 seconds but remains standing independently    Total Score  44        TUG score 14.75 secs with rollator  Gait velocity 12.19 secs = 2.69 ft/sec with rollator           [facility] Adult PT Treatment/Exercise - 04/27/18 1032      Transfers   Transfers  Sit to Stand;Stand to Sit    Sit to Stand  5: Supervision    Stand to Sit  5: Supervision    Number of Reps  Other reps (comment) 3 reps    Comments  feet on floor      Ambulation/Gait   Ambulation/Gait  Yes    Ambulation/Gait Assistance  5: Supervision    Ambulation/Gait Assistance Details  -- no occurrence of toes catching floor today with shorter dist    Ambulation Distance (Feet)  100 Feet    Assistive device  Rollator    Gait Pattern  Step-through pattern;Decreased stride length;Trunk flexed;Poor foot clearance - left    Ambulation Surface  Level;Indoor      Ankle Exercises: Aerobic   Recumbent Bike  SciFit 10" level 1.5 - UE's and LE's used          Balance Exercises - 04/26/18 1013      Balance Exercises: Standing   Rockerboard  Anterior/posterior;EO;10 reps;UE support    Other Standing Exercises  Pt performed forward, back and side kicks 10 reps each inside // bars with UE support ; marching in place 10 reps each leg with UEs upport;  marching forward 2 reps with CGA with 1 UE support;  pt performed alternate tap ups to 1st step with UE support , then without UE support 10 reps each leg - had moderate LOB without UE support; performed tpa ups to 2nd step with light UE support on rail 10 reps each leg                                     PT Long Term Goals - 04/27/18 1055      PT LONG TERM GOAL #1   Title  Improve Berg score by at least 5 points to reduce fall risk. (all STGs  due by  04/27/18)    Baseline  04/03/18: baseline of 33/56 scored today;  44/56 on 04-27-18    Time  4    Period  Weeks    Status  Achieved      PT LONG TERM GOAL #2   Title  Improve TUG score to </= 14 secs with cane to demo improved functional mobility.    Baseline  14.75 secs with rollator on 04-27-18    Time  4    Period  Weeks    Status  Not Met      PT LONG TERM GOAL #3   Title  Increase gait velocity from 2.46 ft/sec to >/= 2.8 ft/sec for increased gait efficiency.    Baseline  13.31 secs = 2.46 ft/sec on 03-28-18: 12.19 secs = 2.69 ft/sec with rollator on 04-27-18    Status  Not Met      PT LONG TERM GOAL #4   Title  Independent in HEP for balance exercises.    Status  Achieved      PT LONG TERM GOAL #5   Title  Increase Berg score from 44/56 to >/= 48/56 to reduce fall risk.    Baseline  44/56 on 04-27-18    Time  4    Period  Weeks    Status  New    Target Date  05/28/18      PT LONG TERM GOAL #6   Title  Pt will negotiate 4 steps with 1 rail using a step by step sequence.     Time  4    Period  Weeks    Status  New    Target Date  05/28/18      PT LONG TERM GOAL #7   Title  Improve TUG score from 14.75 secs to </= 13.0 secs with rollator to reduce fall risk.    Baseline  14.75 secs with rollator    Time  4    Period  Weeks    Status  New    Target Date  05/28/18      PT LONG TERM GOAL #8   Title  Pt will amb. 300' with rollator outside on paved even/uneven surfaces with S without LOB.     Time  4    Period  Weeks    Status  New    Target Date  05/28/18            Plan - 04/27/18 1150    Clinical Impression Statement  Pt has met LTG's #1 and 4:  goals #2 and 3 have improved since scores on initial eval but not fully met per stated goal.  TUG has improved from 17 secs with cane to 14.75 secs with rollator but has not fully met goal of </= 14 secs.  Gait velocity has improved also but not fully met stated goal.  There is also concern with pt amb. quickly as toes  catch occasionally, resulting in pt near tripping.      Rehab Potential  Good    PT Frequency  2x / week    PT Duration  4 weeks    PT Treatment/Interventions  ADLs/Self Care Home Management;DME Instruction;Gait training;Stair training;Therapeutic activities;Therapeutic exercise;Balance training;Neuromuscular re-education;Patient/family education    PT Next Visit Plan  continue gait with rollator, balance training; renewal completed on 04-27-18 for 4 additional weeks    PT Home Exercise Plan  see above    Recommended Other Services  pt has obtained rollator (04-19-18)  Consulted and Agree with Plan of Care  Patient       Patient will benefit from skilled therapeutic intervention in order to improve the following deficits and impairments:  Abnormal gait, Decreased activity tolerance, Decreased balance, Decreased safety awareness, Decreased strength, Pain, Impaired vision/preception  Visit Diagnosis: Other abnormalities of gait and mobility - Plan: PT plan of care cert/re-cert  Unsteadiness on feet - Plan: PT plan of care cert/re-cert  Muscle weakness (generalized) - Plan: PT plan of care cert/re-cert     Problem List Patient Active Problem List   Diagnosis Date Noted  . Gait instability 01/03/2018  . Frequent bowel movements 11/02/2017  . Tremor of both hands   . Stuttering   . Essential hypertension   . Hyperlipidemia 08/13/2015  . Breast cancer of lower-outer quadrant of right female breast ([place]) 02/13/2015  . Vitamin D deficiency 02/06/2015  . Type 2 diabetes mellitus with diabetic foot deformity ([place]) 01/01/2014  . Chronic combined systolic and diastolic heart failure ([place]) 02/26/2013  . OBESITY 02/05/2009  . Asthma, intermittent 02/08/2008  . NEPHROLITHIASIS 01/26/2007  . DJD (degenerative joint disease), multiple sites 01/26/2007    Alda Lea, PT 04/27/2018, 12:07 PM  [facility] [address] [place], [place], [address] Phone: [phone]   Fax:  [phone]  Name: [name] MRN: [id number] Date of Birth: [birth year]

## 2018-04-28 ENCOUNTER — Other Ambulatory Visit: Payer: Self-pay | Admitting: Family Medicine

## 2018-05-01 ENCOUNTER — Ambulatory Visit: Payer: Medicare Other | Attending: Family Medicine | Admitting: Physical Therapy

## 2018-05-01 ENCOUNTER — Encounter: Payer: Self-pay | Admitting: Physical Therapy

## 2018-05-01 DIAGNOSIS — M6281 Muscle weakness (generalized): Secondary | ICD-10-CM | POA: Diagnosis not present

## 2018-05-01 DIAGNOSIS — R2681 Unsteadiness on feet: Secondary | ICD-10-CM | POA: Diagnosis not present

## 2018-05-01 DIAGNOSIS — R2689 Other abnormalities of gait and mobility: Secondary | ICD-10-CM | POA: Diagnosis not present

## 2018-05-01 NOTE — Therapy (Signed)
[facility] [address] [place], [place], [address] Phone: [phone]   Fax:  [phone]  Physical Therapy Treatment  Patient Details  Name: RHIANNE SOMAN MRN: [id number] Date of Birth: 31-May-1951 Referring Provider: Talbert Cage, MD   Encounter Date: 05/01/2018  PT End of Session - 05/01/18 1023    Visit Number  10    Number of Visits  17    Date for PT Re-Evaluation  05/28/18    Authorization Type  UHC Medicare & Medicaid    Authorization Time Period  03-28-18 - 06-26-18; 04-27-18 - 06-27-18    PT Start Time  1018    PT Stop Time  1058    PT Time Calculation (min)  40 min    Equipment Utilized During Treatment  Gait belt    Activity Tolerance  Patient tolerated treatment well;No increased pain;Patient limited by pain    Behavior During Therapy  [facility] for tasks assessed/performed       Past Medical History:  Diagnosis Date  . Anemia   . Arthritis   . Asthma   . Breast cancer of lower-outer quadrant of right female breast ([place]) 02/13/2015  . Cataract   . CHF, acute ([place]) 05/03/2012  . Depression   . Diabetes mellitus   . Eczema   . Fatty liver    Fatty infiltration of liver noted on 03/2012 CT scan  . Fibromyalgia   . GERD (gastroesophageal reflux disease)   . Heart disease   . History of kidney stones    w/ hx of hydronephrosis - followed by [facility]  . HIV nonspecific serology    2006: indeterminate HIV blood test, seen by ID, felt secondary to cross reacting antibodies with no further workup felt necessary at that time   . Hypertension   . Obesity   . Personal history of radiation therapy 2016   right  . Radiation 05/07/15-06/23/15   Right Breast    Past Surgical History:  Procedure Laterality Date  . BREAST BIOPSY Left 02/10/2015   malignant   . BREAST LUMPECTOMY Right 03/21/2015  . CHOLECYSTECTOMY  2003  . CYSTOSCOPY W/ LITHOLAPAXY / EHL    . JOINT REPLACEMENT     bilateral knee  replacement  . LEFT AND RIGHT HEART CATHETERIZATION WITH CORONARY ANGIOGRAM N/A 05/02/2012   Procedure: LEFT AND RIGHT HEART CATHETERIZATION WITH CORONARY ANGIOGRAM;  Surgeon: Burnell Blanks, MD;  Location: [facility] CATH LAB;  Service: Cardiovascular;  Laterality: N/A;  . RADIOACTIVE SEED GUIDED PARTIAL MASTECTOMY WITH AXILLARY SENTINEL LYMPH NODE BIOPSY Right 03/21/2015   Procedure: RIGHT  PARTIAL MASTECTOMY WITH RADIOACTIVE SEED LOCALIZATION RIGHT  AXILLARY SENTINEL LYMPH NODE BIOPSY;  Surgeon: Fanny Skates, MD;  Location: [facility];  Service: General;  Laterality: Right;  . REPLACEMENT TOTAL KNEE BILATERAL  2005 &2006  . VASCULAR SURGERY Right 03/15/2013   Ultrasound guided sclerotherapy    There were no vitals filed for this visit.  Subjective Assessment - 05/01/18 1020    Subjective  Reports she has been "hurting bad" since Friday, "all over". Tried to do the ex's, was able to do a little bit and then needed to stop. Was also busy yesterday.     Pertinent History  Breast cancer 2015:  Neuropathy; DM type 2      Diagnostic tests  MRI - (negative)    Patient Stated Goals  "get back to my normal way of walking"    Currently in Pain?  Yes  Pain Score  8     Pain Location  Generalized    Pain Descriptors / Indicators  Aching;Throbbing    Pain Type  Chronic pain    Pain Onset  More than a month ago    Pain Frequency  Constant    Aggravating Factors   cold, rainy weather    Pain Relieving Factors  medication helps, compression gloves help hands           [facility] Adult PT Treatment/Exercise - 05/01/18 1024      Transfers   Transfers  Sit to Stand;Stand to Sit    Sit to Stand  5: Supervision;With upper extremity assist;From chair/3-in-1    Sit to Stand Details  Verbal cues for safe use of DME/AE    Sit to Stand Details (indicate cue type and reason)  cues for safe use of rollator/brakes    Stand to Sit  5: Supervision;With upper extremity assist;To chair/3-in-1     Stand to Sit Details (indicate cue type and reason)  Verbal cues for safe use of DME/AE    Stand to Sit Details  cues to lock rollator prior to sitting down.       Ambulation/Gait   Ambulation/Gait  Yes    Ambulation/Gait Assistance  5: Supervision    Ambulation/Gait Assistance Details  cues for hand placement on brakes with gait, posture and step length.     Ambulation Distance (Feet)  378 Feet x1    Assistive device  Rollator    Gait Pattern  Step-through pattern;Decreased stride length;Trunk flexed;Poor foot clearance - left    Ambulation Surface  Level;Indoor;Unlevel;Outdoor;Paved    Curb  5: Supervision    Curb Details (indicate cue type and reason)  x1 on outdoor curb with rollator. reminder cues for brake use for safety.       Knee/Hip Exercises: Aerobic   Nustep  UE/LE's at level 2 with goal >/= 55 steps per minute for strengthening and activity tolerance x 10 minutes. cues on posture at times. no increase in pain reported.           Balance Exercises - 05/01/18 1050      Balance Exercises: Standing   Standing Eyes Closed  Wide (BOA);Foam/compliant surface;Other reps (comment);Head turns;30 secs;Limitations    Rockerboard  Anterior/posterior;Head turns;EO;EC;30 seconds;10 reps      Balance Exercises: Standing   Standing Eyes Closed Limitations  on airex in parallel bars: EC no head movements, progressing to EC head movements left<>right and up<>down. min guard to min assist for balance. cues on posture to assist with balance.     Rebounder Limitations  ant/posterior direction in parallel bars: with EO rocking board with emphasis on tall posture x 10 reps with min guard assist, no UE support; holding board steady- alternating UE raises, progressing to bil UE raises, progressing to [facility] no head movements, progressing to EC head movements left<>right and up<>down. up to mod assist at times for balance with intermittent touch to bars. cues on posture and weight shifting to assist with  balance recovery.          PT Long Term Goals - 04/27/18 1055      PT LONG TERM GOAL #1   Title  Improve Berg score by at least 5 points to reduce fall risk. (all STGs due by 04/27/18)    Baseline  04/03/18: baseline of 33/56 scored today;  44/56 on 04-27-18    Time  4    Period  Weeks  Status  Achieved      PT LONG TERM GOAL #2   Title  Improve TUG score to </= 14 secs with cane to demo improved functional mobility.    Baseline  14.75 secs with rollator on 04-27-18    Time  4    Period  Weeks    Status  Not Met      PT LONG TERM GOAL #3   Title  Increase gait velocity from 2.46 ft/sec to >/= 2.8 ft/sec for increased gait efficiency.    Baseline  13.31 secs = 2.46 ft/sec on 03-28-18: 12.19 secs = 2.69 ft/sec with rollator on 04-27-18    Status  Not Met      PT LONG TERM GOAL #4   Title  Independent in HEP for balance exercises.    Status  Achieved      PT LONG TERM GOAL #5   Title  Increase Berg score from 44/56 to >/= 48/56 to reduce fall risk.    Baseline  44/56 on 04-27-18    Time  4    Period  Weeks    Status  New    Target Date  05/28/18      PT LONG TERM GOAL #6   Title  Pt will negotiate 4 steps with 1 rail using a step by step sequence.     Time  4    Period  Weeks    Status  New    Target Date  05/28/18      PT LONG TERM GOAL #7   Title  Improve TUG score from 14.75 secs to </= 13.0 secs with rollator to reduce fall risk.    Baseline  14.75 secs with rollator    Time  4    Period  Weeks    Status  New    Target Date  05/28/18      PT LONG TERM GOAL #8   Title  Pt will amb. 300' with rollator outside on paved even/uneven surfaces with S without LOB.     Time  4    Period  Weeks    Status  New    Target Date  05/28/18            Plan - 05/01/18 1023    Clinical Impression Statement  Today's skilled session continued to focus on use of rollator, balance reactions, and strengthening. No issues reported except for mild increase in low back pain that  resolved with rest breaks. No change at end of session in back pain. Pt is progressing toward goals and should benefit from continued PT to progress toward unmet goals.                               Rehab Potential  Good    PT Frequency  2x / week    PT Duration  4 weeks    PT Treatment/Interventions  ADLs/Self Care Home Management;DME Instruction;Gait training;Stair training;Therapeutic activities;Therapeutic exercise;Balance training;Neuromuscular re-education;Patient/family education    PT Next Visit Plan  continue gait with rollator, balance training    PT Home Exercise Plan  see above    Consulted and Agree with Plan of Care  Patient       Patient will benefit from skilled therapeutic intervention in order to improve the following deficits and impairments:  Abnormal gait, Decreased activity tolerance, Decreased balance, Decreased safety awareness, Decreased strength, Pain, Impaired vision/preception  Visit Diagnosis: Other  abnormalities of gait and mobility  Unsteadiness on feet  Muscle weakness (generalized)     Problem List Patient Active Problem List   Diagnosis Date Noted  . Gait instability 01/03/2018  . Frequent bowel movements 11/02/2017  . Tremor of both hands   . Stuttering   . Essential hypertension   . Hyperlipidemia 08/13/2015  . Breast cancer of lower-outer quadrant of right female breast ([place]) 02/13/2015  . Vitamin D deficiency 02/06/2015  . Type 2 diabetes mellitus with diabetic foot deformity ([place]) 01/01/2014  . Chronic combined systolic and diastolic heart failure ([place]) 02/26/2013  . OBESITY 02/05/2009  . Asthma, intermittent 02/08/2008  . NEPHROLITHIASIS 01/26/2007  . DJD (degenerative joint disease), multiple sites 01/26/2007    Willow Ora, PTA, [facility] [address], [place], [place] [address] [phone] 05/01/18, 48:59 AM   Name: MARIBELLA KUNA MRN: [id number] Date of Birth: [birth year]

## 2018-05-04 ENCOUNTER — Ambulatory Visit: Payer: Medicare Other | Admitting: Physical Therapy

## 2018-05-04 ENCOUNTER — Encounter: Payer: Self-pay | Admitting: Physical Therapy

## 2018-05-04 DIAGNOSIS — R2681 Unsteadiness on feet: Secondary | ICD-10-CM | POA: Diagnosis not present

## 2018-05-04 DIAGNOSIS — M6281 Muscle weakness (generalized): Secondary | ICD-10-CM

## 2018-05-04 DIAGNOSIS — R2689 Other abnormalities of gait and mobility: Secondary | ICD-10-CM | POA: Diagnosis not present

## 2018-05-05 NOTE — Therapy (Signed)
[facility] [address] [place], [place], [address] Phone: [phone]   Fax:  [phone]  Physical Therapy Treatment  Patient Details  Name: [name] MRN: [id number] Date of Birth: 1951/05/27 Referring Provider: Talbert Cage, MD   Encounter Date: 05/04/2018  PT End of Session - 05/04/18 0938    Visit Number  11    Number of Visits  17    Date for PT Re-Evaluation  05/28/18    Authorization Type  [facility] Medicare & Medicaid    Authorization Time Period  03-28-18 - 06-26-18; 04-27-18 - 06-27-18    PT Start Time  0935    PT Stop Time  1015    PT Time Calculation (min)  40 min    Equipment Utilized During Treatment  Gait belt    Activity Tolerance  Patient tolerated treatment well;No increased pain;Patient limited by pain    Behavior During Therapy  [facility] for tasks assessed/performed       Past Medical History:  Diagnosis Date  . Anemia   . Arthritis   . Asthma   . Breast cancer of lower-outer quadrant of right female breast ([place]) 02/13/2015  . Cataract   . CHF, acute ([place]) 05/03/2012  . Depression   . Diabetes mellitus   . Eczema   . Fatty liver    Fatty infiltration of liver noted on 03/2012 CT scan  . Fibromyalgia   . GERD (gastroesophageal reflux disease)   . Heart disease   . History of kidney stones    w/ hx of hydronephrosis - followed by [facility]  . HIV nonspecific serology    2006: indeterminate HIV blood test, seen by ID, felt secondary to cross reacting antibodies with no further workup felt necessary at that time   . Hypertension   . Obesity   . Personal history of radiation therapy 2016   right  . Radiation 05/07/15-06/23/15   Right Breast    Past Surgical History:  Procedure Laterality Date  . BREAST BIOPSY Left 02/10/2015   malignant   . BREAST LUMPECTOMY Right 03/21/2015  . CHOLECYSTECTOMY  2003  . CYSTOSCOPY W/ LITHOLAPAXY / EHL    . JOINT REPLACEMENT     bilateral knee  replacement  . LEFT AND RIGHT HEART CATHETERIZATION WITH CORONARY ANGIOGRAM N/A 05/02/2012   Procedure: LEFT AND RIGHT HEART CATHETERIZATION WITH CORONARY ANGIOGRAM;  Surgeon: Burnell Blanks, MD;  Location: [facility] CATH LAB;  Service: Cardiovascular;  Laterality: N/A;  . RADIOACTIVE SEED GUIDED PARTIAL MASTECTOMY WITH AXILLARY SENTINEL LYMPH NODE BIOPSY Right 03/21/2015   Procedure: RIGHT  PARTIAL MASTECTOMY WITH RADIOACTIVE SEED LOCALIZATION RIGHT  AXILLARY SENTINEL LYMPH NODE BIOPSY;  Surgeon: Fanny Skates, MD;  Location: [facility];  Service: General;  Laterality: Right;  . REPLACEMENT TOTAL KNEE BILATERAL  2005 &2006  . VASCULAR SURGERY Right 03/15/2013   Ultrasound guided sclerotherapy    There were no vitals filed for this visit.  Subjective Assessment - 05/04/18 0937    Subjective  Reports her hands and knees are hurting her today. No falls or other changes to report.     Pertinent History  Breast cancer 2015:  Neuropathy; DM type 2      Diagnostic tests  MRI - (negative)    Patient Stated Goals  "get back to my normal way of walking"    Currently in Pain?  Yes    Pain Score  7     Pain Location  Generalized hands  and knees    Pain Descriptors / Indicators  Aching;Throbbing    Pain Type  Chronic pain    Pain Onset  More than a month ago    Pain Frequency  Constant    Aggravating Factors   cold, rainy weather    Pain Relieving Factors  medication helps, compression gloves help hands            [facility] Adult PT Treatment/Exercise - 05/04/18 0939      Transfers   Transfers  Sit to Stand;Stand to Sit    Sit to Stand  5: Supervision;With upper extremity assist;From chair/3-in-1    Stand to Sit  5: Supervision;With upper extremity assist;To chair/3-in-1      Ambulation/Gait   Ambulation/Gait  Yes    Ambulation/Gait Assistance  6: Modified independent (Device/Increase time)    Ambulation/Gait Assistance Details  no cues or assistance needed with gait     Ambulation Distance (Feet)  500 Feet    Assistive device  Rollator    Gait Pattern  Step-through pattern;Decreased stride length;Trunk flexed;Poor foot clearance - left    Ambulation Surface  Level;Unlevel;Indoor;Outdoor;Paved    Curb  6: Modified independent (Device/increase time)    Curb Details (indicate cue type and reason)  x1 with rollator on outdoor curb      Knee/Hip Exercises: Aerobic   Nustep  LE's mostly (intermittent UE, limited due to UE hand pain) at level 2 with goal >/= 55 steps per minute for strengthening and activity tolerance x 10 minutes. cues on posture at times. no increase in pain reported.           Balance Exercises - 05/04/18 0953      Balance Exercises: Standing   SLS with Vectors  Foam/compliant surface;Other reps (comment);Limitations    Balance Beam  standing with feet across blue foam beam: alternating fwd stepping to floor/back onto beam, then alternating backward stepping to floor/back onto beam. light UE support with min guard assist, cues on step length/height and weight shifting needed as well.       Balance Exercises: Standing   SLS with Vectors Limitations  on blue mat with 2 tall cones: alternating fwd toe taps, then alternating cross toe taps. min guard to min assist moslty, 2 episodes of mod/max assist due to significant posterior balance loss after bending down to upright tipped over cone. pt reported mild dizziness each time as well.           PT Long Term Goals - 04/27/18 1055      PT LONG TERM GOAL #1   Title  Improve Berg score by at least 5 points to reduce fall risk. (all STGs due by 04/27/18)    Baseline  04/03/18: baseline of 33/56 scored today;  44/56 on 04-27-18    Time  4    Period  Weeks    Status  Achieved      PT LONG TERM GOAL #2   Title  Improve TUG score to </= 14 secs with cane to demo improved functional mobility.    Baseline  14.75 secs with rollator on 04-27-18    Time  4    Period  Weeks    Status  Not Met       PT LONG TERM GOAL #3   Title  Increase gait velocity from 2.46 ft/sec to >/= 2.8 ft/sec for increased gait efficiency.    Baseline  13.31 secs = 2.46 ft/sec on 03-28-18: 12.19 secs = 2.69 ft/sec with  rollator on 04-27-18    Status  Not Met      PT LONG TERM GOAL #4   Title  Independent in HEP for balance exercises.    Status  Achieved      PT LONG TERM GOAL #5   Title  Increase Berg score from 44/56 to >/= 48/56 to reduce fall risk.    Baseline  44/56 on 04-27-18    Time  4    Period  Weeks    Status  New    Target Date  05/28/18      PT LONG TERM GOAL #6   Title  Pt will negotiate 4 steps with 1 rail using a step by step sequence.     Time  4    Period  Weeks    Status  New    Target Date  05/28/18      PT LONG TERM GOAL #7   Title  Improve TUG score from 14.75 secs to </= 13.0 secs with rollator to reduce fall risk.    Baseline  14.75 secs with rollator    Time  4    Period  Weeks    Status  New    Target Date  05/28/18      PT LONG TERM GOAL #8   Title  Pt will amb. 300' with rollator outside on paved even/uneven surfaces with S without LOB.     Time  4    Period  Weeks    Status  New    Target Date  05/28/18         Plan - 05/04/18 3614    Clinical Impression Statement  Today's skilled session continued to focus on use of rollator with pt now using it at a Mod I level on both indoor/outdoor surfaces and with curbs. Remainder of session continued to focus on balance and strengthening with no issues reported. Pt is progressing toward goals and should benefit from continued PT to progress toward unmet goals.     Rehab Potential  Good    PT Frequency  2x / week    PT Duration  4 weeks    PT Treatment/Interventions  ADLs/Self Care Home Management;DME Instruction;Gait training;Stair training;Therapeutic activities;Therapeutic exercise;Balance training;Neuromuscular re-education;Patient/family education    PT Next Visit Plan  continue gait with rollator, balance training     PT Home Exercise Plan  see above    Consulted and Agree with Plan of Care  Patient       Patient will benefit from skilled therapeutic intervention in order to improve the following deficits and impairments:  Abnormal gait, Decreased activity tolerance, Decreased balance, Decreased safety awareness, Decreased strength, Pain, Impaired vision/preception  Visit Diagnosis: Other abnormalities of gait and mobility  Unsteadiness on feet  Muscle weakness (generalized)     Problem List Patient Active Problem List   Diagnosis Date Noted  . Gait instability 01/03/2018  . Frequent bowel movements 11/02/2017  . Tremor of both hands   . Stuttering   . Essential hypertension   . Hyperlipidemia 08/13/2015  . Breast cancer of lower-outer quadrant of right female breast ([place]) 02/13/2015  . Vitamin D deficiency 02/06/2015  . Type 2 diabetes mellitus with diabetic foot deformity ([place]) 01/01/2014  . Chronic combined systolic and diastolic heart failure ([place]) 02/26/2013  . OBESITY 02/05/2009  . Asthma, intermittent 02/08/2008  . NEPHROLITHIASIS 01/26/2007  . DJD (degenerative joint disease), multiple sites 01/26/2007    Willow Ora, PTA, [facility]  [address] [place], [place] [address] [phone] 05/05/18, 1:06 PM   Name: [name] MRN: [id number] Date of Birth: March 16, 1951

## 2018-05-11 ENCOUNTER — Ambulatory Visit: Payer: Medicare Other | Admitting: Physical Therapy

## 2018-05-11 ENCOUNTER — Other Ambulatory Visit: Payer: Self-pay | Admitting: Family Medicine

## 2018-05-12 ENCOUNTER — Encounter: Payer: Self-pay | Admitting: Physical Therapy

## 2018-05-12 ENCOUNTER — Ambulatory Visit: Payer: Medicare Other | Admitting: Physical Therapy

## 2018-05-12 DIAGNOSIS — M6281 Muscle weakness (generalized): Secondary | ICD-10-CM

## 2018-05-12 DIAGNOSIS — R2681 Unsteadiness on feet: Secondary | ICD-10-CM

## 2018-05-12 DIAGNOSIS — R2689 Other abnormalities of gait and mobility: Secondary | ICD-10-CM

## 2018-05-14 NOTE — Therapy (Signed)
[facility] [address] [place], [place], [address] Phone: [phone]   Fax:  [phone]  Physical Therapy Treatment  Patient Details  Name: [name] MRN: [id number] Date of Birth: [birth year] Referring Provider: Talbert Cage, MD   Encounter Date: 05/12/2018     05/12/18 0940  PT Visits / Re-Eval  Visit Number 12  Number of Visits 17  Date for PT Re-Evaluation 05/28/18  Authorization  Authorization Type [facility] Medicare & Medicaid  Authorization Time Period 03-28-18 - 06-26-18; 04-27-18 - 06-27-18  PT Time Calculation  PT Start Time 0935  PT Stop Time 1015  PT Time Calculation (min) 40 min  PT - End of Session  Equipment Utilized During Treatment Gait belt  Activity Tolerance Patient tolerated treatment well;No increased pain;Patient limited by pain  Behavior During Therapy [facility] for tasks assessed/performed    Past Medical History:  Diagnosis Date  . Anemia   . Arthritis   . Asthma   . Breast cancer of lower-outer quadrant of right female breast ([place]) 02/13/2015  . Cataract   . CHF, acute ([place]) 05/03/2012  . Depression   . Diabetes mellitus   . Eczema   . Fatty liver    Fatty infiltration of liver noted on 03/2012 CT scan  . Fibromyalgia   . GERD (gastroesophageal reflux disease)   . Heart disease   . History of kidney stones    w/ hx of hydronephrosis - followed by [facility]  . HIV nonspecific serology    2006: indeterminate HIV blood test, seen by ID, felt secondary to cross reacting antibodies with no further workup felt necessary at that time   . Hypertension   . Obesity   . Personal history of radiation therapy 2016   right  . Radiation 05/07/15-06/23/15   Right Breast    Past Surgical History:  Procedure Laterality Date  . BREAST BIOPSY Left 02/10/2015   malignant   . BREAST LUMPECTOMY Right 03/21/2015  . CHOLECYSTECTOMY  2003  . CYSTOSCOPY W/ LITHOLAPAXY / EHL    . JOINT  REPLACEMENT     bilateral knee replacement  . LEFT AND RIGHT HEART CATHETERIZATION WITH CORONARY ANGIOGRAM N/A 05/02/2012   Procedure: LEFT AND RIGHT HEART CATHETERIZATION WITH CORONARY ANGIOGRAM;  Surgeon: Burnell Blanks, MD;  Location: [facility] C[facility] LAB;  Service: Cardiovascular;  Laterality: N/A;  . RADIOACTIVE SEED GUIDED PARTIAL MASTECTOMY WITH AXILLARY SENTINEL LYMPH NODE BIOPSY Right 03/21/2015   Procedure: RIGHT  PARTIAL MASTECTOMY WITH RADIOACTIVE SEED LOCALIZATION RIGHT  AXILLARY SENTINEL LYMPH NODE BIOPSY;  Surgeon: Fanny Skates, MD;  Location: [facility];  Service: General;  Laterality: Right;  . REPLACEMENT TOTAL KNEE BILATERAL  2005 &2006  . VASCULAR SURGERY Right 03/15/2013   Ultrasound guided sclerotherapy    There were no vitals filed for this visit.   05/12/18 0938  Symptoms/Limitations  Subjective No new complaints. No falls. Still having bil hand pain and knee pain. See's the Md next Tuesday about hand/knee pain.   Pertinent History Breast cancer 2015:  Neuropathy; DM type 2    Diagnostic tests MRI - (negative)  Patient Stated Goals "get back to my normal way of walking"  Pain Assessment  Currently in Pain? Yes  Pain Score 6  Pain Location Generalized (hands and knees)  Pain Orientation Right;Left  Pain Descriptors / Indicators Aching;Throbbing  Pain Type Chronic pain  Pain Onset More than a month ago  Pain Frequency Constant  Aggravating Factors  cold, rainy  weather  Pain Relieving Factors medication helps, compression gloves help hands      05/12/18 0942  High Level Balance  High Level Balance Activities Side stepping;Marching forwards;Marching backwards;Tandem walking (tandem fwd/bwd)  High Level Balance Comments on red mats next to counter with UE support as needed: 3 laps each with min guard to min assist for balance. cues on posture and ex form/technique   Knee/Hip Exercises: Aerobic  Nustep LE's mostly (took breaks with UE's due to hand  pain) at level 3.0 for 8 minutes with goal >/= 50 steps per minute for strengthening and activity       05/12/18 0952  Balance Exercises: Standing  Standing Eyes Closed Wide (BOA);Head turns;Foam/compliant surface;Other reps (comment);30 secs;Limitations  Sit to Stand Time with feet across red beam: sit<>stands x 10 reps with UE support, min assist for stability on standing. cues for slow, controlled descent with sitting down.            Balance Exercises: Standing  Standing Eyes Closed Limitations on airex in corner with chair in front for safety: EC no head movements, progressing to EC head movements left<>right, up<>down and diagonals both ways. min guard to min assist for balance.       PT Long Term Goals - 05/12/18 0941      PT LONG TERM GOAL #5   Title  Increase Berg score from 44/56 to >/= 48/56 to reduce fall risk.    Baseline  44/56 on 04-27-18    Time  4    Period  Weeks    Status  New      PT LONG TERM GOAL #6   Title  Pt will negotiate 4 steps with 1 rail using a step by step sequence.     Time  4    Period  Weeks    Status  New      PT LONG TERM GOAL #7   Title  Improve TUG score from 14.75 secs to </= 13.0 secs with rollator to reduce fall risk.    Baseline  14.75 secs with rollator    Time  4    Period  Weeks    Status  New      PT LONG TERM GOAL #8   Title  Pt will amb. 300' with rollator outside on paved even/uneven surfaces with S without LOB.     Time  4    Period  Weeks    Status  New          05/12/18 0940  Plan  Clinical Impression Statement Today's skilled session continued to focus on balance and strengthening with no issues reported. Pt continues to progress toward goals and should benefit from continued PT to progress toward unmet goals.   Pt will benefit from skilled therapeutic intervention in order to improve on the following deficits Abnormal gait;Decreased activity tolerance;Decreased balance;Decreased safety awareness;Decreased  strength;Pain;Impaired vision/preception  Rehab Potential Good  PT Frequency 2x / week  PT Duration 4 weeks  PT Treatment/Interventions ADLs/Self Care Home Management;DME Instruction;Gait training;Stair training;Therapeutic activities;Therapeutic exercise;Balance training;Neuromuscular re-education;Patient/family education  PT Next Visit Plan  balance training and LE strengthening  PT Home Exercise Plan see above  Consulted and Agree with Plan of Care Patient         Patient will benefit from skilled therapeutic intervention in order to improve the following deficits and impairments:  Abnormal gait, Decreased activity tolerance, Decreased balance, Decreased safety awareness, Decreased strength, Pain, Impaired vision/preception  Visit Diagnosis: Other  abnormalities of gait and mobility  Unsteadiness on feet  Muscle weakness (generalized)     Problem List Patient Active Problem List   Diagnosis Date Noted  . Gait instability 01/03/2018  . Frequent bowel movements 11/02/2017  . Tremor of both hands   . Stuttering   . Essential hypertension   . Hyperlipidemia 08/13/2015  . Breast cancer of lower-outer quadrant of right female breast ([place]) 02/13/2015  . Vitamin D deficiency 02/06/2015  . Type 2 diabetes mellitus with diabetic foot deformity ([place]) 01/01/2014  . Chronic combined systolic and diastolic heart failure ([place]) 02/26/2013  . OBESITY 02/05/2009  . Asthma, intermittent 02/08/2008  . NEPHROLITHIASIS 01/26/2007  . DJD (degenerative joint disease), multiple sites 01/26/2007    Willow Ora, PTA, [facility] [address] [place], [place] [address] [phone] 05/14/18, 1:22 PM   Name: [name] MRN: [id number] Date of Birth: 1951-02-23

## 2018-05-15 ENCOUNTER — Encounter: Payer: Self-pay | Admitting: Physical Therapy

## 2018-05-15 ENCOUNTER — Ambulatory Visit: Payer: Medicare Other | Admitting: Physical Therapy

## 2018-05-15 DIAGNOSIS — R2689 Other abnormalities of gait and mobility: Secondary | ICD-10-CM

## 2018-05-15 DIAGNOSIS — R2681 Unsteadiness on feet: Secondary | ICD-10-CM | POA: Diagnosis not present

## 2018-05-15 DIAGNOSIS — M6281 Muscle weakness (generalized): Secondary | ICD-10-CM | POA: Diagnosis not present

## 2018-05-15 NOTE — Therapy (Signed)
[facility] [address] [place], [place], [address] Phone: [phone]   Fax:  [phone]  Physical Therapy Treatment  Patient Details  Name: [name] MRN: [id number] Date of Birth: May 07, 1951 Referring Provider: Talbert Cage, MD   Encounter Date: 05/15/2018  PT End of Session - 05/15/18 1011    Visit Number  13    Number of Visits  17    Date for PT Re-Evaluation  05/28/18    Authorization Type  [facility] Medicare & Medicaid    Authorization Time Period  03-28-18 - 06-26-18; 04-27-18 - 06-27-18    PT Start Time  0905 pt arrived early    PT Stop Time  1002    PT Time Calculation (min)  57 min    Equipment Utilized During Treatment  Gait belt       Past Medical History:  Diagnosis Date  . Anemia   . Arthritis   . Asthma   . Breast cancer of lower-outer quadrant of right female breast ([place]) 02/13/2015  . Cataract   . CHF, acute ([place]) 05/03/2012  . Depression   . Diabetes mellitus   . Eczema   . Fatty liver    Fatty infiltration of liver noted on 03/2012 CT scan  . Fibromyalgia   . GERD (gastroesophageal reflux disease)   . Heart disease   . History of kidney stones    w/ hx of hydronephrosis - followed by [facility]  . HIV nonspecific serology    2006: indeterminate HIV blood test, seen by ID, felt secondary to cross reacting antibodies with no further workup felt necessary at that time   . Hypertension   . Obesity   . Personal history of radiation therapy 2016   right  . Radiation 05/07/15-06/23/15   Right Breast    Past Surgical History:  Procedure Laterality Date  . BREAST BIOPSY Left 02/10/2015   malignant   . BREAST LUMPECTOMY Right 03/21/2015  . CHOLECYSTECTOMY  2003  . CYSTOSCOPY W/ LITHOLAPAXY / [facility]HL    . JOINT REPLACEMENT     bilateral knee replacement  . LEFT AND RIGHT HEART CATHETERIZATION WITH CORONARY ANGIOGRAM N/A 05/02/2012   Procedure: LEFT AND RIGHT HEART CATHETERIZATION WITH  CORONARY ANGIOGRAM;  Surgeon: Burnell Blanks, MD;  Location: [facility] CATH LAB;  Service: Cardiovascular;  Laterality: N/A;  . RADIOACTIVE SEED GUIDED PARTIAL MASTECTOMY WITH AXILLARY SENTINEL LYMPH NODE BIOPSY Right 03/21/2015   Procedure: RIGHT  PARTIAL MASTECTOMY WITH RADIOACTIVE SEED LOCALIZATION RIGHT  AXILLARY SENTINEL LYMPH NODE BIOPSY;  Surgeon: Fanny Skates, MD;  Location: [facility];  Service: General;  Laterality: Right;  . REPLACEMENT TOTAL KNEE BILATERAL  2005 &2006  . VASCULAR SURGERY Right 03/15/2013   Ultrasound guided sclerotherapy    There were no vitals filed for this visit.  Subjective Assessment - 05/15/18 0952    Subjective  Pt states she continues to have bilateral hand & knee pain- states she has MD appt. tomorrow (05-16-18 and she is going to ask if anything can be done about the hand pain as it is starting to radiate up her arm at times    Pertinent History  Breast cancer 2015:  Neuropathy; DM type 2      Diagnostic tests  MRI - (negative)    Patient Stated Goals  "get back to my normal way of walking"  [facility] Adult PT Treatment/Exercise - 05/15/18 0908      Ambulation/Gait   Ambulation/Gait  Yes    Ambulation/Gait Assistance  5: Supervision    Ambulation/Gait Assistance Details  pt used rollator = amb. 300' outside on pavement for LTG assessment    Ambulation Distance (Feet)  400 Feet 300' outdoors, 100' inside     Assistive device  Rollator    Gait Pattern  Step-through pattern;Decreased stride length;Trunk flexed;Poor foot clearance - left    Ambulation Surface  Level;Unlevel;Outdoor;Paved;Indoor    Stairs  Yes    Stairs Assistance  5: Supervision    Stair Management Technique  One rail Right;Step to pattern;Forwards    Number of Stairs  4 4 reps    Height of Stairs  6    Curb  5: Supervision    Curb Details (indicate cue type and reason)  with rollator - 1 rep    Gait Comments  Pt negotiated steps  first 2 reps with 1 rail on right side descending forward with step by step sequence; final 2 reps performed with pt turned sideways, with boht hands on rail - much safer with sideways descension than with forward with use of only 1 hand on rail      Exercises   Exercises  Knee/Hip      Knee/Hip Exercises: Standing   Forward Step Up  Both;1 set;10 reps;Step Height: 6";Hand Hold: 2 minimal bil. UE support on rail          Balance Exercises - 05/15/18 1000      Balance Exercises: Standing   Standing Eyes Opened  Wide (BOA);Foam/compliant surface;2 reps;10 secs on blue balance beam    Rockerboard  Anterior/posterior    Balance Beam  standing perpendicular on blue balance beam    Other Standing Exercises  Pt performed standing forward, back and side kicks 10 reps each leg - standing on red mat with minimal UE support on // bar       TUG score 14.94 secs with rollator   Tap ups to first step with UE support prn 10 reps each leg  Stepping over and back of orange hurdle 5 reps each leg    PT Long Term Goals - 05/15/18 1649      PT LONG TERM GOAL #1   Title  Improve Berg score by at least 5 points to reduce fall risk. (all STGs due by 04/27/18)    Status  Achieved      PT LONG TERM GOAL #2   Title  Improve TUG score to </= 14 secs with cane to demo improved functional mobility.    Baseline  14.75 secs with rollator on 04-27-18      PT LONG TERM GOAL #3   Title  Increase gait velocity from 2.46 ft/sec to >/= 2.8 ft/sec for increased gait efficiency.    Status  Not Met      PT LONG TERM GOAL #4   Title  Independent in HEP for balance exercises.      PT LONG TERM GOAL #6   Title  Pt will negotiate 4 steps with 1 rail using a step by step sequence.     Status  Achieved      PT LONG TERM GOAL #8   Title  Pt will amb. 300' with rollator outside on paved even/uneven surfaces with S without LOB.             Plan - 05/15/18 1656    Clinical Impression  Statement  Pt has met  updated LTG's #6 and 8;  #7 (TUG score)  14.94 secs not met as previous score was 14.75 secs; will re-assess at next session; pt's gait is improved and safer with use of rollator; plan D/C next session    Rehab Potential  Good    PT Frequency  2x / week    PT Duration  4 weeks    PT Treatment/Interventions  ADLs/Self Care Home Management;DME Instruction;Gait training;Stair training;Therapeutic activities;Therapeutic exercise;Balance training;Neuromuscular re-education;Patient/family education    PT Next Visit Plan  check LTG's #5 Merrilee Jansky) and please retest #7 (TUG) as goal not met on 05-15-18:  D/C next session    PT Home Exercise Plan  see above    Consulted and Agree with Plan of Care  Patient       Patient will benefit from skilled therapeutic intervention in order to improve the following deficits and impairments:  Abnormal gait, Decreased activity tolerance, Decreased balance, Decreased safety awareness, Decreased strength, Pain, Impaired vision/preception  Visit Diagnosis: Other abnormalities of gait and mobility  Unsteadiness on feet     Problem List Patient Active Problem List   Diagnosis Date Noted  . Gait instability 01/03/2018  . Frequent bowel movements 11/02/2017  . Tremor of both hands   . Stuttering   . Essential hypertension   . Hyperlipidemia 08/13/2015  . Breast cancer of lower-outer quadrant of right female breast ([place]) 02/13/2015  . Vitamin D deficiency 02/06/2015  . Type 2 diabetes mellitus with diabetic foot deformity ([place]) 01/01/2014  . Chronic combined systolic and diastolic heart failure ([place]) 02/26/2013  . OBESITY 02/05/2009  . Asthma, intermittent 02/08/2008  . NEPHROLITHIASIS 01/26/2007  . DJD (degenerative joint disease), multiple sites 01/26/2007    Alda Lea, PT 05/15/2018, 9:19 PM  [facility] [address] [place], [place], [address] Phone: [phone]   Fax:   [phone]  Name: [name] MRN: [id number] Date of Birth: Dec 28, 1950

## 2018-05-16 ENCOUNTER — Encounter: Payer: Self-pay | Admitting: Family Medicine

## 2018-05-16 ENCOUNTER — Other Ambulatory Visit: Payer: Self-pay

## 2018-05-16 ENCOUNTER — Ambulatory Visit (INDEPENDENT_AMBULATORY_CARE_PROVIDER_SITE_OTHER): Payer: Medicare Other | Admitting: Family Medicine

## 2018-05-16 VITALS — BP 138/80 | HR 74 | Temp 97.4°F | Ht 62.0 in | Wt 224.2 lb

## 2018-05-16 DIAGNOSIS — E1169 Type 2 diabetes mellitus with other specified complication: Secondary | ICD-10-CM | POA: Diagnosis not present

## 2018-05-16 DIAGNOSIS — I10 Essential (primary) hypertension: Secondary | ICD-10-CM | POA: Diagnosis not present

## 2018-05-16 DIAGNOSIS — M21969 Unspecified acquired deformity of unspecified lower leg: Secondary | ICD-10-CM

## 2018-05-16 DIAGNOSIS — R2681 Unsteadiness on feet: Secondary | ICD-10-CM | POA: Diagnosis not present

## 2018-05-16 DIAGNOSIS — M1A09X0 Idiopathic chronic gout, multiple sites, without tophus (tophi): Secondary | ICD-10-CM | POA: Diagnosis not present

## 2018-05-16 DIAGNOSIS — M25579 Pain in unspecified ankle and joints of unspecified foot: Secondary | ICD-10-CM | POA: Diagnosis not present

## 2018-05-16 DIAGNOSIS — M109 Gout, unspecified: Secondary | ICD-10-CM | POA: Insufficient documentation

## 2018-05-16 LAB — POCT GLYCOSYLATED HEMOGLOBIN (HGB A1C): HBA1C, POC (CONTROLLED DIABETIC RANGE): 7.6 % — AB (ref 0.0–7.0)

## 2018-05-16 MED ORDER — METOPROLOL SUCCINATE ER 25 MG PO TB24: 25.0000 mg | ORAL_TABLET | Freq: Every day | ORAL | 3 refills | Status: DC

## 2018-05-16 NOTE — Assessment & Plan Note (Signed)
Improving with Physical Therapy

## 2018-05-16 NOTE — Assessment & Plan Note (Signed)
BP Readings from Last 3 Encounters:  05/16/18 138/80  02/28/18 132/72  02/20/18 (!) 160/79   Will restart low dose BB given her history of systolic failure resolving

## 2018-05-16 NOTE — Progress Notes (Signed)
Subjective  [name] is a 67 y.o. female is presenting with the following  HYPERTENSION Disease Monitoring: Blood pressure range-not chcking Chest pain, palpitations- no      Dyspnea- no Medications: Compliance- has been off metoprolol for a month forgot to pick up Lightheadedness,Syncope- no   Edema- no  DIABETES Disease Monitoring: Blood Sugar ranges-not checking Polyuria/phagia/dipsia- no      Visual problems- no Medications: Compliance- daily twice a day metformin Hypoglycemic symptoms- no  GAIT INSTABILITY Feels is improving with Physical Therapy.  No falls.  Using walker now.  No weakness or incontinence   Monitoring Labs and Parameters Last A1C:  Lab Results  Component Value Date   HGBA1C 7.6 (A) 05/16/2018    Last Lipid:     Component Value Date/Time   CHOL 229 (H) 01/07/2016 1635   HDL 39 (L) 01/07/2016 1635   LDLDIRECT 36 03/31/2016 1027    Last Bmet  Potassium  Date Value Ref Range Status  01/06/2018 4.2 3.5 - 5.2 mmol/L Final  08/03/2017 4.9 3.5 - 5.1 mEq/L Final   Sodium  Date Value Ref Range Status  01/06/2018 143 134 - 144 mmol/L Final  08/03/2017 141 136 - 145 mEq/L Final   Creatinine  Date Value Ref Range Status  08/03/2017 1.5 (H) 0.6 - 1.1 mg/dL Final   Creatinine, Ser  Date Value Ref Range Status  01/06/2018 0.95 0.57 - 1.00 mg/dL Final      Last BPs:  BP Readings from Last 3 Encounters:  05/16/18 138/80  02/28/18 132/72  02/20/18 (!) 160/79       Chief Complaint noted Review of Symptoms - see HPI PMH - Smoking status noted.    Objective Vital Signs reviewed BP 138/80   Pulse 74   Temp (!) 97.4 F (36.3 C) (Oral)   Ht 5\' 2"  (1.575 m)   Wt 224 lb 3.2 oz (101.7 kg)   SpO2 99%   BMI 41.01 kg/m  Using walker and moving well around room Heart - Regular rate and rhythm.  No murmurs, gallops or rubs.    Lungs:  Normal respiratory effort, chest expands symmetrically. Lungs are clear to auscultation, no crackles or  wheezes. Extremities:  No cyanosis, edema, or deformity noted with good range of motion of all major joints.     Assessments/Plans  See after visit summary for details of patient instuctions  Gait instability Improving with Physical Therapy   Essential hypertension BP Readings from Last 3 Encounters:  05/16/18 138/80  02/28/18 132/72  02/20/18 (!) 160/79   Will restart low dose BB given her history of systolic failure resolving    Type 2 diabetes mellitus with diabetic foot deformity Stable   Gout Bilateral hand pain that may be related to gout which she has had flares before.  Will check UA and perhaps start allopurinol

## 2018-05-16 NOTE — Assessment & Plan Note (Signed)
Stable

## 2018-05-16 NOTE — Patient Instructions (Addendum)
Good to see you today!  Thanks for coming in.  For your BP  - take the metoprolol 25 mg every day  For the Arthritis - Use the Aspercreme at least three times a day   We will check your Uric Acid if high I will send in a Rx for allopurinol which can help with gout  Come back in 6 months or sooner if any problems

## 2018-05-16 NOTE — Assessment & Plan Note (Signed)
Bilateral hand pain that may be related to gout which she has had flares before.  Will check UA and perhaps start allopurinol

## 2018-05-17 ENCOUNTER — Encounter: Payer: Self-pay | Admitting: Physical Therapy

## 2018-05-17 ENCOUNTER — Ambulatory Visit: Payer: Medicare Other | Admitting: Physical Therapy

## 2018-05-17 DIAGNOSIS — R2681 Unsteadiness on feet: Secondary | ICD-10-CM

## 2018-05-17 DIAGNOSIS — R2689 Other abnormalities of gait and mobility: Secondary | ICD-10-CM

## 2018-05-17 DIAGNOSIS — M6281 Muscle weakness (generalized): Secondary | ICD-10-CM | POA: Diagnosis not present

## 2018-05-17 LAB — BASIC METABOLIC PANEL
BUN/Creatinine Ratio: 19 (ref 12–28)
BUN: 25 mg/dL (ref 8–27)
CALCIUM: 9.3 mg/dL (ref 8.7–10.3)
CHLORIDE: 106 mmol/L (ref 96–106)
CO2: 19 mmol/L — AB (ref 20–29)
Creatinine, Ser: 1.34 mg/dL — ABNORMAL HIGH (ref 0.57–1.00)
GFR calc Af Amer: 47 mL/min/{1.73_m2} — ABNORMAL LOW (ref >59)
GFR calc non Af Amer: 40 mL/min/{1.73_m2} — ABNORMAL LOW (ref >59)
GLUCOSE: 135 mg/dL — AB (ref 65–99)
POTASSIUM: 4.7 mmol/L (ref 3.5–5.2)
SODIUM: 141 mmol/L (ref 134–144)

## 2018-05-17 LAB — URIC ACID: URIC ACID: 9.4 mg/dL — AB (ref 2.5–7.1)

## 2018-05-18 NOTE — Therapy (Signed)
[facility] [address] [place], [place], [address] Phone: [phone]   Fax:  [phone]  Physical Therapy Treatment  Patient Details  Name: [name] MRN: [id number] Date of Birth: 05/02/1951 Referring Provider: Marshall Chambliss, MD   Encounter Date: 05/17/2018  PT End of Session - 05/17/18 1109    Visit Number  14    Number of Visits  17    Date for PT Re-Evaluation  05/28/18    Authorization Type  [employer] Medicare & Medicaid    Authorization Time Period  03-28-18 - 06-26-18; 04-27-18 - 06-27-18    PT Start Time  1104    PT Stop Time  1136 discharge, not all time was needed    PT Time Calculation (min)  32 min    Equipment Utilized During Treatment  Gait belt    Activity Tolerance  Patient tolerated treatment well;No increased pain;Patient limited by pain    Behavior During Therapy  WFL for tasks assessed/performed       Past Medical History:  Diagnosis Date  . Anemia   . Arthritis   . Asthma   . Breast cancer of lower-outer quadrant of right female breast ([facility]) 02/13/2015  . Cataract   . CHF, acute ([facility]) 05/03/2012  . Depression   . Diabetes mellitus   . Eczema   . Fatty liver    Fatty infiltration of liver noted on 03/2012 CT scan  . Fibromyalgia   . GERD (gastroesophageal reflux disease)   . Heart disease   . History of kidney stones    w/ hx of hydronephrosis - followed by [facility]  . HIV nonspecific serology    2006: indeterminate HIV blood test, seen by ID, felt secondary to cross reacting antibodies with no further workup felt necessary at that time   . Hypertension   . Obesity   . Personal history of radiation therapy 2016   right  . Radiation 05/07/15-06/23/15   Right Breast    Past Surgical History:  Procedure Laterality Date  . BREAST BIOPSY Left 02/10/2015   malignant   . BREAST LUMPECTOMY Right 03/21/2015  . CHOLECYSTECTOMY  2003  . CYSTOSCOPY W/ LITHOLAPAXY / [facility]    . JOINT  REPLACEMENT     bilateral knee replacement  . LEFT AND RIGHT HEART CATHETERIZATION WITH CORONARY ANGIOGRAM N/A 05/02/2012   Procedure: LEFT AND RIGHT HEART CATHETERIZATION WITH CORONARY ANGIOGRAM;  Surgeon: Christopher D McAlhany, MD;  Location: [facility] CATH LAB;  Service: Cardiovascular;  Laterality: N/A;  . RADIOACTIVE SEED GUIDED PARTIAL MASTECTOMY WITH AXILLARY SENTINEL LYMPH NODE BIOPSY Right 03/21/2015   Procedure: RIGHT  PARTIAL MASTECTOMY WITH RADIOACTIVE SEED LOCALIZATION RIGHT  AXILLARY SENTINEL LYMPH NODE BIOPSY;  Surgeon: Haywood Mcelmurry, MD;  Location: [facility];  Service: General;  Laterality: Right;  . REPLACEMENT TOTAL KNEE BILATERAL  2005 &2006  . VASCULAR SURGERY Right 03/15/2013   Ultrasound guided sclerotherapy    There were no vitals filed for this visit.  Subjective Assessment - 05/17/18 1107    Subjective  Saw her PCP yesterday who advised her to use her arthitis creame on her hands. He also took blood to check and ensure she is not having a gout flareup. No falls.     Pertinent History  Breast cancer 2015:  Neuropathy; DM type 2      Diagnostic tests  MRI - (negative)    Patient Stated Goals  "get back to my normal way of walking"      Currently in Pain?  Yes    Pain Score  6     Pain Location  Generalized hands and knees    Pain Orientation  Right;Left    Pain Descriptors / Indicators  Aching;Throbbing;Radiating    Pain Type  Chronic pain    Pain Onset  More than a month ago    Pain Frequency  Constant    Aggravating Factors   aggrivated by cold, gripping with hands    Pain Relieving Factors  medication and compression gloves help some          [facility] PT Assessment - 05/17/18 1110      Ambulation/Gait   Ambulation/Gait  Yes    Gait velocity  11.88 sec's= 2.76 ft/sec with rollator      Standardized Balance Assessment   Standardized Balance Assessment  Berg Balance Test;Timed Up and Go Test      Berg Balance Test   Sit to Stand  Able to stand  without using hands and stabilize independently    Standing Unsupported  Able to stand safely 2 minutes    Sitting with Back Unsupported but Feet Supported on Floor or Stool  Able to sit safely and securely 2 minutes    Stand to Sit  Sits safely with minimal use of hands    Transfers  Able to transfer safely, minor use of hands    Standing Unsupported with Eyes Closed  Able to stand 10 seconds safely    Standing Ubsupported with Feet Together  Able to place feet together independently and stand 1 minute safely    From Standing, Reach Forward with Outstretched Arm  Can reach forward >12 cm safely (5") 5 inches    From Standing Position, Pick up Object from Floor  Able to pick up shoe safely and easily    From Standing Position, Turn to Look Behind Over each Shoulder  Looks behind from both sides and weight shifts well    Turn 360 Degrees  Able to turn 360 degrees safely but slowly    Standing Unsupported, Alternately Place Feet on Step/Stool  Able to stand independently and safely and complete 8 steps in 20 seconds 11.91    Standing Unsupported, One Foot in Front  Able to take small step independently and hold 30 seconds    Standing on One Leg  Tries to lift leg/unable to hold 3 seconds but remains standing independently    Total Score  48      Timed Up and Go Test   TUG  Normal TUG    Normal TUG (seconds)  11.87          PT Long Term Goals - 05/17/18 1130      PT LONG TERM GOAL #1   Title  Increase Berg score from 44/56 to >/= 48/56 to reduce fall risk.    Baseline  05/17/18: met today with score of 48/56    Status  Achieved      PT LONG TERM GOAL #2   Title  Pt will negotiate 4 steps with 1 rail using a step by step sequence.     Baseline  05/15/18: met on this date    Status  Achieved      PT LONG TERM GOAL #3   Title  Increase gait velocity from 2.46 ft/sec to >/= 2.8 ft/sec for increased gait efficiency.    Status  Achieved      PT LONG TERM GOAL #4   Title  Improve TUG  score from 14.75 secs to </= 13.0 secs with rollator to reduce fall risk.    Baseline  05/17/18: met today with score of     Status  Achieved      PT LONG TERM GOAL #5   Title  Pt will amb. 300' with rollator outside on paved even/uneven surfaces with S without LOB.     Baseline  05/15/18: met on this date    Status  Achieved      PT LONG TERM GOAL #6   Title  --    Status  Achieved      PT LONG TERM GOAL #8   Title  --         Plan - 05/17/18 1109    Clinical Impression Statement  Pt has met all LTGs and is agreeable to discharge today.     Rehab Potential  Good    PT Frequency  2x / week    PT Duration  4 weeks    PT Treatment/Interventions  ADLs/Self Care Home Management;DME Instruction;Gait training;Stair training;Therapeutic activities;Therapeutic exercise;Balance training;Neuromuscular re-education;Patient/family education    PT Next Visit Plan  discharge per PT plan of care    PT Home Exercise Plan  see above    Consulted and Agree with Plan of Care  Patient       Patient will benefit from skilled therapeutic intervention in order to improve the following deficits and impairments:  Abnormal gait, Decreased activity tolerance, Decreased balance, Decreased safety awareness, Decreased strength, Pain, Impaired vision/preception  Visit Diagnosis: Other abnormalities of gait and mobility  Unsteadiness on feet  Muscle weakness (generalized)     Problem List Patient Active Problem List   Diagnosis Date Noted  . Pain in joint, ankle and foot 05/16/2018  . Gout 05/16/2018  . Gait instability 01/03/2018  . Frequent bowel movements 11/02/2017  . Tremor of both hands   . Stuttering   . Essential hypertension   . Hyperlipidemia 08/13/2015  . Breast cancer of lower-outer quadrant of right female breast ([facility]) 02/13/2015  . Vitamin D deficiency 02/06/2015  . Type 2 diabetes mellitus with diabetic foot deformity ([facility]) 01/01/2014  . Chronic combined systolic and diastolic  heart failure ([facility]) 02/26/2013  . OBESITY 02/05/2009  . Asthma, intermittent 02/08/2008  . NEPHROLITHIASIS 01/26/2007  . DJD (degenerative joint disease), multiple sites 01/26/2007     , PTA, CLT [facility] [address], [address] [place], [place] [address] [phone] 05/18/18, 1:13 PM   Name: Maryna D Lok MRN: [id number] Date of Birth: 07/19/1951   

## 2018-05-25 ENCOUNTER — Encounter: Payer: Self-pay | Admitting: Family Medicine

## 2018-05-30 DIAGNOSIS — H10023 Other mucopurulent conjunctivitis, bilateral: Secondary | ICD-10-CM | POA: Diagnosis not present

## 2018-05-31 ENCOUNTER — Telehealth: Payer: Self-pay | Admitting: *Deleted

## 2018-05-31 DIAGNOSIS — M1A09X0 Idiopathic chronic gout, multiple sites, without tophus (tophi): Secondary | ICD-10-CM

## 2018-05-31 NOTE — Telephone Encounter (Signed)
Pt would like to know the results of her bloodwork. Treyana Sturgell, Salome Spotted, CMA

## 2018-06-02 MED ORDER — ALLOPURINOL 100 MG PO TABS: 100.0000 mg | ORAL_TABLET | Freq: Every day | ORAL | 1 refills | Status: DC

## 2018-06-02 NOTE — Telephone Encounter (Signed)
Spoke with her Stil having hand pain Does not believe ever took allopurinol  Given elevate UA will start.

## 2018-06-02 NOTE — Assessment & Plan Note (Signed)
Elevated UA.  May have chronic gout symptoms in her hands and knees.   Already on colchicine.  Will start allopurinol and follow levels and symptoms

## 2018-06-06 ENCOUNTER — Telehealth: Payer: Self-pay

## 2018-06-06 NOTE — Telephone Encounter (Signed)
Patient daughter, Tommi Rumps, left message that patient hand is very swollen and painful, medication not helping, would like to speak with PCP.  Returned call and got voicemail, left message that I would call patient.  Spoke with patient, been taking Allopurinol but hand very swollen and painful. Patient stated that she or daughter would like to speak directly to PCP.  Tommi Rumps [phone] [place] [phone]   Danley Danker, RN [facility] RN)

## 2018-06-07 NOTE — Telephone Encounter (Signed)
Pt left vm on nurse line asking for pcp to call her about her mothers hand. See below message.

## 2018-06-08 ENCOUNTER — Telehealth: Payer: Self-pay | Admitting: Family Medicine

## 2018-06-08 NOTE — Telephone Encounter (Signed)
LeftVM for her to call us about her hand pain that her daughter mentioned when I spoke with her today about Ms Friel

## 2018-06-08 NOTE — Telephone Encounter (Signed)
Left hand is swollen and tender.  No fever or blisters or streaks  Recommend taking colchicine twice a day for a week then bck to daily  If any worsening or fever to call us  She agrees

## 2018-06-08 NOTE — Telephone Encounter (Signed)
Daughter would like to discuss her mom's condition and medications  I suggested she come in with her mom for an appointment next week She agrees

## 2018-06-13 ENCOUNTER — Other Ambulatory Visit: Payer: Self-pay

## 2018-06-13 DIAGNOSIS — H10021 Other mucopurulent conjunctivitis, right eye: Secondary | ICD-10-CM | POA: Diagnosis not present

## 2018-06-21 ENCOUNTER — Encounter: Payer: Self-pay | Admitting: Podiatry

## 2018-06-21 ENCOUNTER — Ambulatory Visit (INDEPENDENT_AMBULATORY_CARE_PROVIDER_SITE_OTHER): Payer: Medicare Other | Admitting: Podiatry

## 2018-06-21 DIAGNOSIS — M2010 Hallux valgus (acquired), unspecified foot: Secondary | ICD-10-CM

## 2018-06-21 DIAGNOSIS — M129 Arthropathy, unspecified: Secondary | ICD-10-CM

## 2018-06-21 DIAGNOSIS — E1140 Type 2 diabetes mellitus with diabetic neuropathy, unspecified: Secondary | ICD-10-CM

## 2018-06-21 DIAGNOSIS — B351 Tinea unguium: Secondary | ICD-10-CM

## 2018-06-21 NOTE — Progress Notes (Signed)
Patient ID: [name], female   DOB: [birth year], 67 y.o.   MRN: [id number] Complaint:  Visit Type: Patient returns to the office for preventative foot care services.  Patient states nails are painful walking and wearing her shoes.  She is unable to self treat.  Patient is diabetic.   Podiatric Exam: Vascular: dorsalis pedis and posterior tibial pulses are palpable bilateral. Capillary return is immediate. Temperature gradient is WNL. Skin turgor WNL  Sensorium: Diminished Semmes Weinstein monofilament test. Normal tactile sensation bilaterally. Nail Exam: Pt has thick disfigured discolored nails second and third toenails left foot. Ulcer Exam: There is no evidence of ulcer or pre-ulcerative changes or infection. Orthopedic Exam: Muscle tone and strength are WNL. No limitations in general ROM. No crepitus or effusions noted. Foot type and digits show no abnormalities. . Asymptomatic HAV B/L.  Pes planus noted. Skin: No Porokeratosis. No infection or ulcers  Diagnosis:  Onychomycosis  Diabetes  HAV  B/L  Pes planus  B/L  Treatment & Plan Procedures and Treatment: Debridement and grinding of long painful nails both feet.Marland Kitchen Marland KitchenRTC 3 months.Patient qualifies for diabetic shoes due to DPN  HAV and pes planus  B/L.,  Return Visit-Office Procedure: Patient instructed to return to the office for a follow up visit for scheduled preventive foot care services.   Gardiner Barefoot DPM

## 2018-06-27 ENCOUNTER — Encounter: Payer: Self-pay | Admitting: *Deleted

## 2018-06-27 ENCOUNTER — Other Ambulatory Visit: Payer: Self-pay | Admitting: *Deleted

## 2018-06-27 DIAGNOSIS — H10021 Other mucopurulent conjunctivitis, right eye: Secondary | ICD-10-CM | POA: Diagnosis not present

## 2018-06-27 NOTE — Patient Outreach (Signed)
[facility]) Care Management  03/02/5912  ALLISEN PIDGEON [birth year] [id number]  Referral via EMMI-Prevent Campaign: Score 9-   Telephone call #1 to patient; left HIPPA complinat voice mail requesting call back.  Plan: Geophysicist/field seismologist. Follow up 2-4 business days.  Sherrin Daisy, RN BSN [place] Management Coordinator [facility] Care Management  [phone]

## 2018-07-03 ENCOUNTER — Other Ambulatory Visit: Payer: Self-pay | Admitting: *Deleted

## 2018-07-03 ENCOUNTER — Ambulatory Visit: Payer: Medicare Other | Admitting: Orthotics

## 2018-07-03 DIAGNOSIS — E1140 Type 2 diabetes mellitus with diabetic neuropathy, unspecified: Secondary | ICD-10-CM

## 2018-07-03 DIAGNOSIS — M722 Plantar fascial fibromatosis: Secondary | ICD-10-CM

## 2018-07-03 DIAGNOSIS — M2010 Hallux valgus (acquired), unspecified foot: Secondary | ICD-10-CM

## 2018-07-03 NOTE — Patient Outreach (Signed)
[facility]) Care Management  12/03/4006  [name] 6/76/1952 [id number]  Referral via EMMI-Prevent Campaign: Score 9-   Telephone call #2. Spoke with patient & advised of [facility] care management services.  HIPPA verification received from patient.  Patient states she has had diabetes over 10 years. Voices that last A1C level was 7.6 and goal is 6.5. (It is noted that MD visit was on 6/18). Patient voices that she recently had cataracts removed and only uses reading glasses. States she has neuropathy and uses walker to get around.   Voices that she has taken diabetes classes years ago but needs refresher.  States she uses bus to get to MD appointments. States she has no problems getting her medications and is taking them as prescribed.   Patient consents to referral to [facility] for disease management of chronic condition (Diabetes Mellitus )  Plan: Referral to [facility] for DM.  Sherrin Daisy, RN BSN [place] Management Coordinator [facility] Care Management  [phone]

## 2018-07-03 NOTE — Progress Notes (Addendum)
Patient was seen today for diabetic shoe measurement and fitting.   Patient was measured with brannock device and the following is relevant: Patient Diabetic Doctor is Althiemer Patient DPM is Prudence Davidson Patient has following conditions: Hammertoes, polyneuropathy, hallux valgus, diabetes 2 Patient chose as shoe: [id number]

## 2018-07-05 ENCOUNTER — Encounter: Payer: Self-pay | Admitting: *Deleted

## 2018-07-10 ENCOUNTER — Other Ambulatory Visit: Payer: Self-pay | Admitting: Family Medicine

## 2018-07-10 ENCOUNTER — Other Ambulatory Visit: Payer: Self-pay | Admitting: Hematology

## 2018-07-10 DIAGNOSIS — M21969 Unspecified acquired deformity of unspecified lower leg: Principal | ICD-10-CM

## 2018-07-10 DIAGNOSIS — E1169 Type 2 diabetes mellitus with other specified complication: Secondary | ICD-10-CM

## 2018-07-17 ENCOUNTER — Encounter: Payer: Self-pay | Admitting: *Deleted

## 2018-07-17 ENCOUNTER — Other Ambulatory Visit: Payer: Self-pay | Admitting: *Deleted

## 2018-07-17 NOTE — Patient Outreach (Signed)
[place] [facility]) Care Management  07/12/4817   DONNIE GEDEON [id number]  RN Health Coach telephone call to patient.  Hipaa compliance verified. Per patient she does not check her blood sugars. Patient stated she only gets them checked at the Dr office. Patient is being monitored by the [facility] Heart failure program. Patient weighs 202 pounds 5 ft 2. Per patient she would like to loose a little weight. Patient uses a rollator walker to get around. Patient has not had any current falls. Patient is able to get to Dr appointments by [facility] transportation and her daughter. Patient stated she has no problem affording her medication at this time. Patient has agreed to follow up outreach calls.  Current Medications:  Current Outpatient Medications  Medication Sig Dispense Refill  . acetaminophen (TYLENOL 8 HOUR ARTHRITIS PAIN) 650 MG CR tablet Take 650 mg by mouth every 8 (eight) hours as needed for pain.    Marland Kitchen albuterol (PROVENTIL HFA;VENTOLIN HFA) 108 (90 Base) MCG/ACT inhaler Inhale 2 puffs into the lungs every 6 (six) hours as needed for wheezing. 1 Inhaler 3  . allopurinol (ZYLOPRIM) 100 MG tablet Take 1 tablet (100 mg total) by mouth daily. 90 tablet 1  . aspirin 81 MG tablet Take 81 mg by mouth daily.    Marland Kitchen atorvastatin (LIPITOR) 40 MG tablet Take 1 tablet (40 mg total) by mouth daily. 90 tablet 3  . benazepril (LOTENSIN) 20 MG tablet TAKE 1 TABLET BY MOUTH ONCE DAILY 90 tablet 3  . colchicine 0.6 MG tablet TAKE 1 TABLET BY MOUTH ONCE DAILY 90 tablet 0  . dorzolamide-timolol (COSOPT) 22.3-6.8 MG/ML ophthalmic solution Place 1 drop into both eyes 2 (two) times daily.  0  . exemestane (AROMASIN) 25 MG tablet TAKE 1 TABLET BY MOUTH ONCE DAILY AFTER BREAKFAST 90 tablet 0  . fluticasone (FLOVENT HFA) 110 MCG/ACT inhaler INHALE 2 PUFFS INTO THE LUNGS 2 TIMES DAILY 1 Inhaler 11  . furosemide (LASIX) 20 MG tablet TAKE 1 TABLET BY MOUTH ONCE DAILY 90 tablet 2  . gabapentin  (NEURONTIN) 100 MG capsule TAKE 4 TO 5 CAPSULES BY MOUTH 3 TIMES DAILY 450 capsule 2  . latanoprost (XALATAN) 0.005 % ophthalmic solution Place 1 drop into both eyes 4 (four) times daily.   0  . LOTEMAX 0.5 % GEL Place 1 application into both eyes at bedtime.  0  . metFORMIN (GLUCOPHAGE) 1000 MG tablet TAKE 1 TABLET BY MOUTH TWICE A DAY WITH FOOD 180 tablet 3  . metoprolol succinate (TOPROL-XL) 25 MG 24 hr tablet Take 1 tablet (25 mg total) by mouth daily. 90 tablet 3  . spironolactone (ALDACTONE) 25 MG tablet TAKE 1 TABLET BY MOUTH ONCE DAILY 90 tablet 1   No current facility-administered medications for this visit.     Functional Status:  In your present state of health, do you have any difficulty performing the following activities: 07/17/2018  Hearing? N  Vision? N  Difficulty concentrating or making decisions? N  Walking or climbing stairs? Y  Dressing or bathing? N  Doing errands, shopping? Y  Preparing Food and eating ? N  Using the Toilet? N  In the past six months, have you accidently leaked urine? N  Do you have problems with loss of bowel control? N  Managing your Medications? N  Managing your Finances? N  Housekeeping or managing your Housekeeping? Y  Some recent data might be hidden    Fall/Depression Screening: Fall Risk  07/17/2018 07/03/2018 05/16/2018  Falls in the past year? No No No  Number falls in past yr: - - -  Injury with Fall? - - -  Risk Factor Category  - - -  Risk for fall due to : Impaired balance/gait;Impaired mobility - -  Risk for fall due to: Comment - - -  Follow up - - -   PHQ 2/9 Scores 07/17/2018 07/03/2018 05/16/2018 02/28/2018 01/03/2018 11/02/2017 10/05/2017  PHQ - 2 Score 0 0 0 0 0 0 0  PHQ- 9 Score - - - - - - -   THN CM Care Plan Problem One     Most Recent Value  Care Plan Problem One  Knowledge Deficit in self management of Diabetes  Role Documenting the Problem One  [place] for Problem One  Active  THN Long Term Goal    Patient will see a decrease in A1C from 7.6 with next blood draw  THN Long Term Goal Start Date  07/17/18  Interventions for Problem One Long Term Goal  RN discussed what the A1C is. RN discussed patient diet and eating healthy. RN snet Living well with diabtes booklet. RN will follow up outreach within further discussion and teach back  THN CM Short Term Goal #1   Patient will be able to make healthier choices with snacks within the next 30 days  THN CM Short Term Goal #1 Start Date  07/17/18  Interventions for Short Term Goal #1  RN discussed making healthier snack choices. RN sent educational material on low carb snacks. RN will follow up with further discussion and teach back   THN CM Short Term Goal #2   Patient will have a better understanding of low salt diet within the next 30 days  THN CM Short Term Goal #2 Start Date  07/17/18  Interventions for Short Term Goal #2  RN discussed eating foods with low salt  and monitoring sodium intake. RN sent patient educational material on DASH diet, Low salt diet and a picture sheet with foods high and low in salt. RN will follow up with next outreach for discussion and teach back      Assessment:  Does not check blood sugars Patient is on a diabetic low sodium diet A1C 7.6 Patient will benefit from [place] telephonic outreach for education and support for diabetes self management.  Plan: RN discussed A1C RN sent 2019 Calendar book RN sent living well with diabetes RN discussed patient diet and making healthy choices RN sent educational material on low carb healthy snacks Rn sent educational information on low sodium diet RN will follow up within the month of September RN sent barrier letter and assessment to PCP  [place] Management [phone]

## 2018-08-02 ENCOUNTER — Ambulatory Visit: Payer: Medicare Other | Admitting: Orthotics

## 2018-08-02 DIAGNOSIS — R2681 Unsteadiness on feet: Secondary | ICD-10-CM

## 2018-08-02 DIAGNOSIS — E1140 Type 2 diabetes mellitus with diabetic neuropathy, unspecified: Secondary | ICD-10-CM

## 2018-08-02 DIAGNOSIS — E1142 Type 2 diabetes mellitus with diabetic polyneuropathy: Secondary | ICD-10-CM

## 2018-08-02 DIAGNOSIS — M2010 Hallux valgus (acquired), unspecified foot: Secondary | ICD-10-CM | POA: Diagnosis not present

## 2018-08-02 DIAGNOSIS — M2142 Flat foot [pes planus] (acquired), left foot: Secondary | ICD-10-CM | POA: Diagnosis not present

## 2018-08-02 DIAGNOSIS — M2141 Flat foot [pes planus] (acquired), right foot: Secondary | ICD-10-CM | POA: Diagnosis not present

## 2018-08-02 DIAGNOSIS — E11610 Type 2 diabetes mellitus with diabetic neuropathic arthropathy: Secondary | ICD-10-CM

## 2018-08-02 DIAGNOSIS — M722 Plantar fascial fibromatosis: Secondary | ICD-10-CM

## 2018-08-04 NOTE — Progress Notes (Signed)
Patient had appointment today for definitive and final diabetic shoe fitting and delivery.  Patient was seen by Betha, C.Ped, OHI.   The inserts fit well and accomplished full contact with the plantar surface of the foot bilateral; the shoes fit well and offered forefoot freedom, no noticible heel slippage, and good toe clearance w/ the insert in place.  Patient was advised to monitor of any skin irritation, breakdown.  Patient was satisfied with fit and function. 

## 2018-08-09 ENCOUNTER — Other Ambulatory Visit: Payer: Self-pay | Admitting: Family Medicine

## 2018-08-14 ENCOUNTER — Telehealth: Payer: Self-pay | Admitting: Hematology

## 2018-08-14 NOTE — Telephone Encounter (Signed)
YF PAL 9/23 - moved appointments to 10/10. Spoke with patient.

## 2018-08-21 ENCOUNTER — Ambulatory Visit: Payer: Medicare Other | Admitting: Hematology

## 2018-08-21 ENCOUNTER — Other Ambulatory Visit: Payer: Medicare Other

## 2018-08-22 ENCOUNTER — Ambulatory Visit: Payer: Self-pay | Admitting: *Deleted

## 2018-08-29 ENCOUNTER — Encounter: Payer: Self-pay | Admitting: Gastroenterology

## 2018-08-29 ENCOUNTER — Ambulatory Visit (INDEPENDENT_AMBULATORY_CARE_PROVIDER_SITE_OTHER): Payer: Medicare Other

## 2018-08-29 VITALS — BP 130/80 | HR 67 | Temp 97.6°F | Ht 62.0 in | Wt 221.8 lb

## 2018-08-29 DIAGNOSIS — Z23 Encounter for immunization: Secondary | ICD-10-CM

## 2018-08-29 DIAGNOSIS — R194 Change in bowel habit: Secondary | ICD-10-CM

## 2018-08-29 DIAGNOSIS — Z0000 Encounter for general adult medical examination without abnormal findings: Secondary | ICD-10-CM

## 2018-08-29 NOTE — Progress Notes (Signed)
Subjective:   [name] is a 66 y.o. female who presents for Medicare Annual (Subsequent) preventive examination.  Review of Systems:  Physical assessment deferred to PCP.  Cardiac Risk Factors include: advanced age (>19men, >78 women);diabetes mellitus;obesity (BMI >30kg/m2)    Objective:    Vitals: BP 130/80   Pulse 67   Temp 97.6 F (36.4 C) (Oral)   Ht 5\' 2"  (1.575 m)   Wt 221 lb 12.8 oz (100.6 kg)   SpO2 99%   BMI 40.57 kg/m   Body mass index is 40.57 kg/m.  Advanced Directives 08/29/2018 05/16/2018 03/28/2018 02/28/2018 01/03/2018 01/03/2018 11/02/2017  Does Patient Have a Medical Advance Directive? No No Yes No Yes No No  Type of Advance Directive - - Event organiser - -  Does patient want to make changes to medical advance directive? - - - - - - -  Copy of Press photographer in Chart? - - No - copy requested - - - -  Would patient like information on creating a medical advance directive? No - Patient declined No - Patient declined - No - Patient declined - No - Patient declined No - Patient declined    Tobacco Social History   Tobacco Use  Smoking Status Never Smoker  Smokeless Tobacco Never Used     Counseling given: Not Answered  Clinical Intake:  Pre-visit preparation completed: Yes  Pain : 0-10 Pain Score: 5  Pain Type: Chronic pain Pain Relieving Factors: medication Effect of Pain on Daily Activities: worse in the AM  Pain Relieving Factors: medication  Nutritional Status: BMI > 30  Obese Diabetes: Yes CBG done?: No Did pt. bring in CBG monitor from home?: No  How often do you need to have someone help you when you read instructions, pamphlets, or other written materials from your doctor or pharmacy?: 1 - Never What is the last grade level you completed in school?: Some college  Interpreter Needed?: No    Past Medical History:  Diagnosis Date  . Anemia   . Arthritis   . Asthma   .  Breast cancer of lower-outer quadrant of right female breast ([place]) 02/13/2015  . Cataract   . CHF, acute ([place]) 05/03/2012  . Depression   . Diabetes mellitus   . Eczema   . Fatty liver    Fatty infiltration of liver noted on 03/2012 CT scan  . Fibromyalgia   . GERD (gastroesophageal reflux disease)   . Glaucoma   . Heart disease   . History of kidney stones    w/ hx of hydronephrosis - followed by [facility]  . HIV nonspecific serology    2006: indeterminate HIV blood test, seen by ID, felt secondary to cross reacting antibodies with no further workup felt necessary at that time   . Hypertension   . Obesity   . Personal history of radiation therapy 2016   right  . Radiation 05/07/15-06/23/15   Right Breast   Past Surgical History:  Procedure Laterality Date  . BREAST BIOPSY Left 02/10/2015   malignant   . BREAST LUMPECTOMY Right 03/21/2015  . CHOLECYSTECTOMY  2003  . CYSTOSCOPY W/ LITHOLAPAXY / EHL    . JOINT REPLACEMENT     bilateral knee replacement  . LEFT AND RIGHT HEART CATHETERIZATION WITH CORONARY ANGIOGRAM N/A 05/02/2012   Procedure: LEFT AND RIGHT HEART CATHETERIZATION WITH CORONARY ANGIOGRAM;  Surgeon: Burnell Blanks, MD;  Location: [facility] CATH LAB;  Service: Cardiovascular;  Laterality: N/A;  . RADIOACTIVE SEED GUIDED PARTIAL MASTECTOMY WITH AXILLARY SENTINEL LYMPH NODE BIOPSY Right 03/21/2015   Procedure: RIGHT  PARTIAL MASTECTOMY WITH RADIOACTIVE SEED LOCALIZATION RIGHT  AXILLARY SENTINEL LYMPH NODE BIOPSY;  Surgeon: Fanny Skates, MD;  Location: [facility];  Service: General;  Laterality: Right;  . REPLACEMENT TOTAL KNEE BILATERAL  2005 &2006  . VASCULAR SURGERY Right 03/15/2013   Ultrasound guided sclerotherapy   Family History  Problem Relation Age of Onset  . Diabetes Mother   . Stroke Mother   . Heart disease Father   . Anemia Father   . Cancer Sister 17       nose cancer   . Cancer Cousin 30       ovarian cancer   . Cancer Cousin 1         ovarian cancer    Social History   Socioeconomic History  . Marital status: Widowed    Spouse name: [name]  . Number of children: 3  . Years of education: 71  . Highest education level: Associate degree: occupational, Hotel manager, or vocational program  Occupational History  . Occupation: retired-CNA, Microbiologist: RETIRED  . Occupation: Psychologist, sport and exercise  Social Needs  . Financial resource strain: Not very hard  . Food insecurity:    Worry: Never true    Inability: Never true  . Transportation needs:    Medical: No    Non-medical: No  Tobacco Use  . Smoking status: Never Smoker  . Smokeless tobacco: Never Used  Substance and Sexual Activity  . Alcohol use: No    Alcohol/week: 0.0 standard drinks  . Drug use: No  . Sexual activity: Never    Birth control/protection: Post-menopausal  Lifestyle  . Physical activity:    Days per week: 2 days    Minutes per session: 20 min  . Stress: Not at all  Relationships  . Social connections:    Talks on phone: More than three times a week    Gets together: Three times a week    Attends religious service: More than 4 times per year    Active member of club or organization: Yes    Attends meetings of clubs or organizations: More than 4 times per year    Relationship status: Widowed  Other Topics Concern  . Not on file  Social History Narrative   Emergency Contact: son, [name] [phone]   Patient lives is single story home with daughter, [name] and grandddaughter, [name]. Ramp to front door, 3 steps in back. Home has smoke detectors, no throw rugs. No grab bars in bathroom. No pets.   Diet: Pt has a varied diet.  Reports eating 2 meals a day.    Exercise: walks daily 15-20 mins; not as much due to possible irritable bowel; does exercise at church once weekly. Serves on [employer] at [employer] and as a Chief Executive Officer at [employer].   Seatbelts: Pt reports wearing seatbelt when in vehicles. Does not drive.    Hobbies: word searches, church, time with family and friends, cooking, walking when she can; going out shopping.   Drinks occasional soda; drinks a lot of water and some kool-aid; does drink tea             Outpatient Encounter Medications as of 08/29/2018  Medication Sig  . acetaminophen (TYLENOL 8 HOUR ARTHRITIS PAIN) 650 MG CR tablet Take 650 mg by mouth every 8 (eight) hours as needed for  pain.  . albuterol (PROVENTIL HFA;VENTOLIN HFA) 108 (90 Base) MCG/ACT inhaler Inhale 2 puffs into the lungs every 6 (six) hours as needed for wheezing.  Marland Kitchen allopurinol (ZYLOPRIM) 100 MG tablet Take 1 tablet (100 mg total) by mouth daily.  Marland Kitchen aspirin 81 MG tablet Take 81 mg by mouth daily.  Marland Kitchen atorvastatin (LIPITOR) 40 MG tablet Take 1 tablet (40 mg total) by mouth daily.  . benazepril (LOTENSIN) 20 MG tablet TAKE 1 TABLET BY MOUTH ONCE DAILY  . colchicine 0.6 MG tablet TAKE 1 TABLET BY MOUTH ONCE DAILY  . dorzolamide-timolol (COSOPT) 22.3-6.8 MG/ML ophthalmic solution Place 1 drop into both eyes 2 (two) times daily.  Marland Kitchen exemestane (AROMASIN) 25 MG tablet TAKE 1 TABLET BY MOUTH ONCE DAILY AFTER BREAKFAST  . fluticasone (FLOVENT HFA) 110 MCG/ACT inhaler INHALE 2 PUFFS INTO THE LUNGS 2 TIMES DAILY  . furosemide (LASIX) 20 MG tablet TAKE 1 TABLET BY MOUTH ONCE DAILY  . gabapentin (NEURONTIN) 100 MG capsule TAKE 4 TO 5 CAPSULES BY MOUTH 3 TIMES DAILY  . latanoprost (XALATAN) 0.005 % ophthalmic solution Place 1 drop into both eyes 4 (four) times daily.   . metFORMIN (GLUCOPHAGE) 1000 MG tablet TAKE 1 TABLET BY MOUTH TWICE A DAY WITH FOOD  . metoprolol succinate (TOPROL-XL) 25 MG 24 hr tablet Take 1 tablet (25 mg total) by mouth daily.  Marland Kitchen spironolactone (ALDACTONE) 25 MG tablet TAKE 1 TABLET BY MOUTH ONCE DAILY  . sulfacetamide (BLEPH-10) 10 % ophthalmic solution INT 1 GTT IN OD QID  . [DISCONTINUED] LOTEMAX 0.5 % GEL Place 1 application into both eyes at bedtime.   No facility-administered encounter  medications on file as of 08/29/2018.     Activities of Daily Living In your present state of health, do you have any difficulty performing the following activities: 08/29/2018 07/17/2018  Hearing? N N  Vision? N N  Comment wears reading glasses; eye appt coming up 10/14 -  Difficulty concentrating or making decisions? N N  Walking or climbing stairs? N Y  Dressing or bathing? N N  Doing errands, shopping? N Y  Conservation officer, nature and eating ? N N  Using the Toilet? N N  In the past six months, have you accidently leaked urine? N N  Do you have problems with loss of bowel control? N N  Managing your Medications? N N  Managing your Finances? N N  Housekeeping or managing your Housekeeping? N Y  Some recent data might be hidden   Patient Care Team: Lind Covert, MD as PCP - General (Family Medicine) Gevena Cotton, MD (Ophthalmology) Rana Snare, MD (Urology) Dr. Edwin Cap ([place]) Fanny Skates, MD as Consulting Physician (General Surgery) Holley Bouche, NP as Nurse Practitioner (Nurse Practitioner) Truitt Merle, MD as Consulting Physician (Hematology) Gardiner Barefoot, DPM (Podiatry) [place], Eppie Gibson, RN as [place] Management    Assessment:   This is a routine wellness examination for Safia. The patient was informed that the wellness visit is to identify future health risk and educate and initiate measures that can reduce risk for increased disease through the lifespan.   Exercise Activities and Dietary recommendations Current Exercise Habits: Home exercise routine;Structured exercise class, Type of exercise: walking, Time (Minutes): 15, Frequency (Times/Week): 2, Weekly Exercise (Minutes/Week): 30, Intensity: Mild, Exercise limited by: orthopedic condition(s)  Goals      Patient Stated   . less pain (pt-stated)      Other   . Blood Pressure < 150/90    .  Exercise 150 min/wk Moderate Activity      Patient unable to walk  as much as she would like to due to frequent diarrhea. Does not like to be outside or away from bathroom. Happens frequently after breakfast. Must wait a couple hours before leaving house. If she has an appt she skips breakfast so her bowels "won't act up".   Would like to see GI about this problem to see if there is a solution. Has tried Metamucil without improvement. Referral placed.   Fall Risk Fall Risk  08/29/2018 07/17/2018 07/03/2018 05/16/2018 02/28/2018  Falls in the past year? No No No No No  Number falls in past yr: - - - - -  Injury with Fall? - - - - -  Risk Factor Category  - - - - -  Risk for fall due to : Impaired mobility Impaired balance/gait;Impaired mobility - - -  Risk for fall due to: Comment - - - - -  Follow up - - - - -   Is the patient's home free of loose throw rugs in walkways, pet beds, electrical cords, etc?   yes      Grab bars in the bathroom? no      Handrails on the stairs?   yes      Adequate lighting?   yes  Timed Get Up and Go performed: 8 seconds, uses both hands to rise from chair. Walks with a rollator walker.   Depression Screen PHQ 2/9 Scores 08/29/2018 07/17/2018 07/03/2018 05/16/2018  PHQ - 2 Score 0 0 0 0  PHQ- 9 Score - - - -     Cognitive Function MMSE - Mini Mental State Exam 08/29/2018 05/21/2015 01/18/2014 04/26/2013 04/03/2012  Orientation to time 5 5 5 5 5   Orientation to Place 5 5 5 5 5   Registration 3 3 3 3 3   Attention/ Calculation 5 5 5 5 5   Recall 3 3 3 1 3   Language- name 2 objects 2 2 2 2 2   Language- repeat 1 1 1 1 1   Language- follow 3 step command 3 3 3 3 3   Language- read & follow direction 1 1 1 1 1   Write a sentence 1 1 1 1 1   Copy design 1 1 1 1 1   Total score 30 30 30 28 30      6CIT Screen 08/29/2018  What Year? 0 points  What month? 0 points  What time? 0 points  Count back from 20 0 points  Months in reverse 0 points  Repeat phrase 0 points  Total Score 0    Immunization History  Administered Date(s) Administered   . Influenza Split 08/17/2011, 08/23/2012  . Influenza Whole 09/12/2009, 12/14/2010  . Influenza,inj,Quad PF,6+ Mos 08/27/2013, 09/02/2014, 08/13/2015, 09/08/2016, 10/05/2017, 08/29/2018  . Pneumococcal Conjugate-13 05/21/2015  . Pneumococcal Polysaccharide-23 04/30/2012, 10/05/2017  . Td 07/14/2009  . Zoster 06/02/2012   Flu vaccine updated today.  Screening Tests Health Maintenance  Topic Date Due  . OPHTHALMOLOGY EXAM  09/11/2018 (Originally 07/21/2018)  . FOOT EXAM  10/05/2018  . HEMOGLOBIN A1C  11/15/2018  . TETANUS/TDAP  07/15/2019  . MAMMOGRAM  02/01/2020  . COLONOSCOPY  10/03/2021  . INFLUENZA VACCINE  Completed  . DEXA SCAN  Completed  . Hepatitis C Screening  Completed  . PNA vac Low Risk Adult  Completed  . DTaP/Tdap/Td  Discontinued    Cancer Screenings: Lung: Low Dose CT Chest recommended if Age 36-80 years, 30 pack-year currently smoking OR have quit  w/in 15years. Patient does not qualify. Breast:  Up to date on Mammogram? Yes   Up to date of Bone Density/Dexa? Yes Colorectal: up to date  Additional Screenings: : Hepatitis C Screening: complete        Plan:  Flu vaccine updated today. Referral to GI for frequent diarrhea which interferes with activities.  All other HM up to date.   I have personally reviewed and noted the following in the patient's chart:   . Medical and social history . Use of alcohol, tobacco or illicit drugs  . Current medications and supplements . Functional ability and status . Nutritional status . Physical activity . Advanced directives . List of other physicians . Hospitalizations, surgeries, and ER visits in previous 12 months . Vitals . Screenings to include cognitive, depression, and falls . Referrals and appointments  In addition, I have reviewed and discussed with patient certain preventive protocols, quality metrics, and best practice recommendations. A written personalized care plan for preventive services as well as  general preventive health recommendations were provided to patient.  Danley Danker, RN [facility] RN)     I have reviewed this visit Lind Covert

## 2018-08-29 NOTE — Patient Instructions (Addendum)
[name] , Thank you for taking time to come for your Medicare Wellness Visit. I appreciate your ongoing commitment to your health goals. Please review the following plan we discussed and let me know if I can assist you in the future.    Your flu shot was given today and a referral to a GI doctor was made.   These are the goals we discussed: Goals      Patient Stated   . less pain (pt-stated)      Other   . Blood Pressure < 150/90    . Exercise 150 min/wk Moderate Activity       This is a list of the screening recommended for you and due dates:  Health Maintenance  Topic Date Due  . Eye exam for diabetics  09/11/2018*  . Complete foot exam   10/05/2018  . Hemoglobin A1C  11/15/2018  . Tetanus Vaccine  07/15/2019  . Mammogram  02/01/2020  . Colon Cancer Screening  10/03/2021  . Flu Shot  Completed  . DEXA scan (bone density measurement)  Completed  .  Hepatitis C: One time screening is recommended by Center for Disease Control  (CDC) for  adults born from [birth year] through 1967.   Completed  . Pneumonia vaccines  Completed  . DTaP/Tdap/Td vaccine  Discontinued  *Topic was postponed. The date shown is not the original due date.    Fall Prevention in the Home Falls can cause injuries. They can happen to people of all ages. There are many things you can do to make your home safe and to help prevent falls. What can I do on the outside of my home?  Regularly fix the edges of walkways and driveways and fix any cracks.  Remove anything that might make you trip as you walk through a door, such as a raised step or threshold.  Trim any bushes or trees on the path to your home.  Use bright outdoor lighting.  Clear any walking paths of anything that might make someone trip, such as rocks or tools.  Regularly check to see if handrails are loose or broken. Make sure that both sides of any steps have handrails.  Any raised decks and porches should have guardrails on the edges.  Have  any leaves, snow, or ice cleared regularly.  Use sand or salt on walking paths during winter.  Clean up any spills in your garage right away. This includes oil or grease spills. What can I do in the bathroom?  Use night lights.  Install grab bars by the toilet and in the tub and shower. Do not use towel bars as grab bars.  Use non-skid mats or decals in the tub or shower.  If you need to sit down in the shower, use a plastic, non-slip stool.  Keep the floor dry. Clean up any water that spills on the floor as soon as it happens.  Remove soap buildup in the tub or shower regularly.  Attach bath mats securely with double-sided non-slip rug tape.  Do not have throw rugs and other things on the floor that can make you trip. What can I do in the bedroom?  Use night lights.  Make sure that you have a light by your bed that is easy to reach.  Do not use any sheets or blankets that are too big for your bed. They should not hang down onto the floor.  Have a firm chair that has side arms. You can use this  for support while you get dressed.  Do not have throw rugs and other things on the floor that can make you trip. What can I do in the kitchen?  Clean up any spills right away.  Avoid walking on wet floors.  Keep items that you use a lot in easy-to-reach places.  If you need to reach something above you, use a strong step stool that has a grab bar.  Keep electrical cords out of the way.  Do not use floor polish or wax that makes floors slippery. If you must use wax, use non-skid floor wax.  Do not have throw rugs and other things on the floor that can make you trip. What can I do with my stairs?  Do not leave any items on the stairs.  Make sure that there are handrails on both sides of the stairs and use them. Fix handrails that are broken or loose. Make sure that handrails are as long as the stairways.  Check any carpeting to make sure that it is firmly attached to the  stairs. Fix any carpet that is loose or worn.  Avoid having throw rugs at the top or bottom of the stairs. If you do have throw rugs, attach them to the floor with carpet tape.  Make sure that you have a light switch at the top of the stairs and the bottom of the stairs. If you do not have them, ask someone to add them for you. What else can I do to help prevent falls?  Wear shoes that: ? Do not have high heels. ? Have rubber bottoms. ? Are comfortable and fit you well. ? Are closed at the toe. Do not wear sandals.  If you use a stepladder: ? Make sure that it is fully opened. Do not climb a closed stepladder. ? Make sure that both sides of the stepladder are locked into place. ? Ask someone to hold it for you, if possible.  Clearly mark and make sure that you can see: ? Any grab bars or handrails. ? First and last steps. ? Where the edge of each step is.  Use tools that help you move around (mobility aids) if they are needed. These include: ? Canes. ? Walkers. ? Scooters. ? Crutches.  Turn on the lights when you go into a dark area. Replace any light bulbs as soon as they burn out.  Set up your furniture so you have a clear path. Avoid moving your furniture around.  If any of your floors are uneven, fix them.  If there are any pets around you, be aware of where they are.  Review your medicines with your doctor. Some medicines can make you feel dizzy. This can increase your chance of falling. Ask your doctor what other things that you can do to help prevent falls. This information is not intended to replace advice given to you by your health care provider. Make sure you discuss any questions you have with your health care provider. Document Released: 09/11/2009 Document Revised: 04/22/2016 Document Reviewed: 12/20/2014 Elsevier Interactive Patient Education  2018 [place] Maintenance, Female Adopting a healthy lifestyle and getting preventive care can go a  long way to promote health and wellness. Talk with your health care provider about what schedule of regular examinations is right for you. This is a good chance for you to check in with your provider about disease prevention and staying healthy. In between checkups, there are plenty of things you can  do on your own. Experts have done a lot of research about which lifestyle changes and preventive measures are most likely to keep you healthy. Ask your health care provider for more information. Weight and diet Eat a healthy diet  Be sure to include plenty of vegetables, fruits, low-fat dairy products, and lean protein.  Do not eat a lot of foods high in solid fats, added sugars, or salt.  Get regular exercise. This is one of the most important things you can do for your health. ? Most adults should exercise for at least 150 minutes each week. The exercise should increase your heart rate and make you sweat (moderate-intensity exercise). ? Most adults should also do strengthening exercises at least twice a week. This is in addition to the moderate-intensity exercise.  Maintain a healthy weight  Body mass index (BMI) is a measurement that can be used to identify possible weight problems. It estimates body fat based on height and weight. Your health care provider can help determine your BMI and help you achieve or maintain a healthy weight.  For females 31 years of age and older: ? A BMI below 18.5 is considered underweight. ? A BMI of 18.5 to 24.9 is normal. ? A BMI of 25 to 29.9 is considered overweight. ? A BMI of 30 and above is considered obese.  Watch levels of cholesterol and blood lipids  You should start having your blood tested for lipids and cholesterol at 69 years of age, then have this test every 5 years.  You may need to have your cholesterol levels checked more often if: ? Your lipid or cholesterol levels are high. ? You are older than 69 years of age. ? You are at high risk for  heart disease.  Cancer screening Lung Cancer  Lung cancer screening is recommended for adults 49-62 years old who are at high risk for lung cancer because of a history of smoking.  A yearly low-dose CT scan of the lungs is recommended for people who: ? Currently smoke. ? Have quit within the past 15 years. ? Have at least a 30-pack-year history of smoking. A pack year is smoking an average of one pack of cigarettes a day for 1 year.  Yearly screening should continue until it has been 15 years since you quit.  Yearly screening should stop if you develop a health problem that would prevent you from having lung cancer treatment.  Breast Cancer  Practice breast self-awareness. This means understanding how your breasts normally appear and feel.  It also means doing regular breast self-exams. Let your health care provider know about any changes, no matter how small.  If you are in your 20s or 30s, you should have a clinical breast exam (CBE) by a health care provider every 1-3 years as part of a regular health exam.  If you are 63 or older, have a CBE every year. Also consider having a breast X-ray (mammogram) every year.  If you have a family history of breast cancer, talk to your health care provider about genetic screening.  If you are at high risk for breast cancer, talk to your health care provider about having an MRI and a mammogram every year.  Breast cancer gene (BRCA) assessment is recommended for women who have family members with BRCA-related cancers. BRCA-related cancers include: ? Breast. ? Ovarian. ? Tubal. ? Peritoneal cancers.  Results of the assessment will determine the need for genetic counseling and BRCA1 and BRCA2 testing.  Cervical  Cancer Your health care provider may recommend that you be screened regularly for cancer of the pelvic organs (ovaries, uterus, and vagina). This screening involves a pelvic examination, including checking for microscopic changes to  the surface of your cervix (Pap test). You may be encouraged to have this screening done every 3 years, beginning at age 42.  For women ages 82-65, health care providers may recommend pelvic exams and Pap testing every 3 years, or they may recommend the Pap and pelvic exam, combined with testing for human papilloma virus (HPV), every 5 years. Some types of HPV increase your risk of cervical cancer. Testing for HPV may also be done on women of any age with unclear Pap test results.  Other health care providers may not recommend any screening for nonpregnant women who are considered low risk for pelvic cancer and who do not have symptoms. Ask your health care provider if a screening pelvic exam is right for you.  If you have had past treatment for cervical cancer or a condition that could lead to cancer, you need Pap tests and screening for cancer for at least 20 years after your treatment. If Pap tests have been discontinued, your risk factors (such as having a new sexual partner) need to be reassessed to determine if screening should resume. Some women have medical problems that increase the chance of getting cervical cancer. In these cases, your health care provider may recommend more frequent screening and Pap tests.  Colorectal Cancer  This type of cancer can be detected and often prevented.  Routine colorectal cancer screening usually begins at 70 years of age and continues through 69 years of age.  Your health care provider may recommend screening at an earlier age if you have risk factors for colon cancer.  Your health care provider may also recommend using home test kits to check for hidden blood in the stool.  A small camera at the end of a tube can be used to examine your colon directly (sigmoidoscopy or colonoscopy). This is done to check for the earliest forms of colorectal cancer.  Routine screening usually begins at age 83.  Direct examination of the colon should be repeated every  5-10 years through 69 years of age. However, you may need to be screened more often if early forms of precancerous polyps or small growths are found.  Skin Cancer  Check your skin from head to toe regularly.  Tell your health care provider about any new moles or changes in moles, especially if there is a change in a mole's shape or color.  Also tell your health care provider if you have a mole that is larger than the size of a pencil eraser.  Always use sunscreen. Apply sunscreen liberally and repeatedly throughout the day.  Protect yourself by wearing long sleeves, pants, a wide-brimmed hat, and sunglasses whenever you are outside.  Heart disease, diabetes, and high blood pressure  High blood pressure causes heart disease and increases the risk of stroke. High blood pressure is more likely to develop in: ? People who have blood pressure in the high end of the normal range (130-139/85-89 mm Hg). ? People who are overweight or obese. ? People who are African American.  If you are 21-62 years of age, have your blood pressure checked every 3-5 years. If you are 89 years of age or older, have your blood pressure checked every year. You should have your blood pressure measured twice-once when you are at a hospital or  clinic, and once when you are not at a hospital or clinic. Record the average of the two measurements. To check your blood pressure when you are not at a hospital or clinic, you can use: ? An automated blood pressure machine at a pharmacy. ? A home blood pressure monitor.  If you are between 24 years and 36 years old, ask your health care provider if you should take aspirin to prevent strokes.  Have regular diabetes screenings. This involves taking a blood sample to check your fasting blood sugar level. ? If you are at a normal weight and have a low risk for diabetes, have this test once every three years after 70 years of age. ? If you are overweight and have a high risk for  diabetes, consider being tested at a younger age or more often. Preventing infection Hepatitis B  If you have a higher risk for hepatitis B, you should be screened for this virus. You are considered at high risk for hepatitis B if: ? You were born in a country where hepatitis B is common. Ask your health care provider which countries are considered high risk. ? Your parents were born in a high-risk country, and you have not been immunized against hepatitis B (hepatitis B vaccine). ? You have HIV or AIDS. ? You use needles to inject street drugs. ? You live with someone who has hepatitis B. ? You have had sex with someone who has hepatitis B. ? You get hemodialysis treatment. ? You take certain medicines for conditions, including cancer, organ transplantation, and autoimmune conditions.  Hepatitis C  Blood testing is recommended for: ? Everyone born from [birth year] through 1967. ? Anyone with known risk factors for hepatitis C.  Sexually transmitted infections (STIs)  You should be screened for sexually transmitted infections (STIs) including gonorrhea and chlamydia if: ? You are sexually active and are younger than 69 years of age. ? You are older than 69 years of age and your health care provider tells you that you are at risk for this type of infection. ? Your sexual activity has changed since you were last screened and you are at an increased risk for chlamydia or gonorrhea. Ask your health care provider if you are at risk.  If you do not have HIV, but are at risk, it may be recommended that you take a prescription medicine daily to prevent HIV infection. This is called pre-exposure prophylaxis (PrEP). You are considered at risk if: ? You are sexually active and do not regularly use condoms or know the HIV status of your partner(s). ? You take drugs by injection. ? You are sexually active with a partner who has HIV.  Talk with your health care provider about whether you are at high risk  of being infected with HIV. If you choose to begin PrEP, you should first be tested for HIV. You should then be tested every 3 months for as long as you are taking PrEP. Pregnancy  If you are premenopausal and you may become pregnant, ask your health care provider about preconception counseling.  If you may become pregnant, take 400 to 800 micrograms (mcg) of folic acid every day.  If you want to prevent pregnancy, talk to your health care provider about birth control (contraception). Osteoporosis and menopause  Osteoporosis is a disease in which the bones lose minerals and strength with aging. This can result in serious bone fractures. Your risk for osteoporosis can be identified using a bone density scan.  If you are 3 years of age or older, or if you are at risk for osteoporosis and fractures, ask your health care provider if you should be screened.  Ask your health care provider whether you should take a calcium or vitamin D supplement to lower your risk for osteoporosis.  Menopause may have certain physical symptoms and risks.  Hormone replacement therapy may reduce some of these symptoms and risks. Talk to your health care provider about whether hormone replacement therapy is right for you. Follow these instructions at home:  Schedule regular health, dental, and eye exams.  Stay current with your immunizations.  Do not use any tobacco products including cigarettes, chewing tobacco, or electronic cigarettes.  If you are pregnant, do not drink alcohol.  If you are breastfeeding, limit how much and how often you drink alcohol.  Limit alcohol intake to no more than 1 drink per day for nonpregnant women. One drink equals 12 ounces of beer, 5 ounces of wine, or 1 ounces of hard liquor.  Do not use street drugs.  Do not share needles.  Ask your health care provider for help if you need support or information about quitting drugs.  Tell your health care provider if you often  feel depressed.  Tell your health care provider if you have ever been abused or do not feel safe at home. This information is not intended to replace advice given to you by your health care provider. Make sure you discuss any questions you have with your health care provider. Document Released: 05/31/2011 Document Revised: 04/22/2016 Document Reviewed: 08/19/2015 Elsevier Interactive Patient Education  [employer].

## 2018-09-06 NOTE — Progress Notes (Signed)
[place]  Telephone:(336) [phone] Fax:(336) [phone]  Clinic follow up Note   Patient Care Team: Lind Covert, MD as PCP - General (Family Medicine) Gevena Cotton, MD (Ophthalmology) Rana Snare, MD (Urology) Dr. Edwin Cap ([place]) Fanny Skates, MD as Consulting Physician (General Surgery) Holley Bouche, NP (Inactive) as Nurse Practitioner (Nurse Practitioner) Truitt Merle, MD as Consulting Physician (Hematology) Gardiner Barefoot, DPM (Podiatry) Pleasant, Eppie Gibson, RN as [facility]   Date of Service:  09/07/2018  CHIEF COMPLAINTS:  Follow up breast cancer  Oncology History     Breast cancer of lower-outer quadrant of right female breast   Staging form: Breast, AJCC 7th Edition     Clinical stage from 02/19/2015: Stage IA (T1b, N0, M0) - Unsigned       Staging comments: Staged at breast conference on 3.23.16      Pathologic stage from 03/24/2015: Stage IA (T1c, N0, cM0) - Signed by Enid Cutter, MD on 03/31/2015       Staging comments: Staged on final lumpectomy specimen by Dr. Donato Heinz.      Breast cancer of lower-outer quadrant of right female breast ([place])   01/23/2015 Mammogram    Possible mass in right breast warrants further evaluation.  No findings in the left breast suspicious for malignancy    02/03/2015 Breast US    Right breast: 5 x 5 x 5 mm irregular hypoechoic mass with internal color vascularity at 6 o'clock position, 4 cm from the nipple    02/10/2015 Initial Biopsy    Right needle core bx LOQ: Invasive ductal carcinoma, grade 1, ER+ (100%), PR+ (60%), HER2/neu negative (ratio 1.13), Ki67 3%; DCIS.      02/12/2015 Breast MRI    Biopsy-proven malignancy in the central right breast at posterior depth. No MR findings to suggest multicentric or contralateral malignancy.    02/12/2015 Clinical Stage    Stage IA: T1b N0    03/21/2015 Definitive Surgery    Right breast lumpectomy / SLNB  Dalbert Batman): Invasive ductal carcinoma, grade 1, 1.1 cm, ER+ (100%), PR+ (73%), HER-2 negative (ratio 1.3), Ki67 6%, negative margins / no lymphovascular invasion; DCIS.  One lymph node negative for tumor (0/1).      03/21/2015 Oncotype testing    RS 7, low risk. (ROR 5% for 10-year recurrence with Tamoxifen alone).     03/21/2015 Pathologic Stage    Stage IA: pT1c pN0    05/07/2015 - 06/23/2015 Radiation Therapy    Adjuvant RT completed Pablo Ledger): Right breast 45 Gy over 25 fractions.  Right breast boost 16 Gy over 8 fractions. Total dose: 60 Gy.    06/02/2015 -  Anti-estrogen oral therapy    Aromasin 25 mg once daily Burr Medico).  Planned duration of therapy: 5 years.    08/15/2015 Survivorship    Survivorship visit completed and copy of survivorship care plan provided to patient.    01/31/2018 Mammogram    IMPRESSION: No mammographic evidence for malignancy.      HISTORY OF PRESENTING ILLNESS:  [name] 67 y.o. female is here because of newly diagnosed breast cancer.  This was discovered by screening mammogtram. Her last screening mammogram in February 2015 was normal.  The screening mammogram on 01/23/2015 showed a possible mass in the right breast. She underwent diagnostic mammogram and ultrasound on 02/03/2015, which showed a 5 x 5 x 5 mm irregular mass in the right breast 6:00 position.  Your biopsy of the mass showed invasive  ductal carcinoma and DCIS.   She has multiple medical problems. She has chronic arthritis, with diffuse join pain, 8/10, she takes tylenol. No chest pain, (+) dyspena on moderate exertion, no nausea, abdominal bloating or change of her bowel habits. She is able to do most of her ADLs, limited light housework, not physically active, spends quite a bit of time sitting during the daytime.  She was hospitalized for UTI in 9/15 and went to rehab for a month. She has been using crane more regularly after that.    CURRENT THERAPY: aromasin 26m daily, started on  06/02/2015  INTERIM HISTORY:  AYLIANA GRAVOISreturns for follow up of her right breast cancer. She was last seen by me 7 months ago. She presents to the clinic today by herself. She is on exemestane and is tolerating well. She denies new complains or new noticeable side effects of exemestane. She has generalized joint pain that is getting worse. She states that the joint pain is worse on her hands, especially in a cold environment, and  is the same throughout the day. She denies CP or SOB. She states that has been having diarrhea for a while now and she will see her GI doctor soon. She uses 2 imodium pill a day.     MEDICAL HISTORY:  Past Medical History:  Diagnosis Date  . Anemia   . Arthritis   . Asthma   . Breast cancer of [place]-outer quadrant of right female breast ([place] 02/13/2015  . Cataract   . [place] 05[place]03[place]2012  . Depression   . Diabetes mellitus   . Eczema   . Fatty liver    Fatty infiltration of liver noted on 03/2012 CT scan  . Fibromyalgia   . GERD (gastroesophageal reflux disease)   . Glaucoma   . Heart disease   . History of kidney stones    w/ hx of hydronephrosis - followed by [facility]  . HIV nonspecific serology    2006: indeterminate HIV blood test, seen by ID, felt secondary to cross reacting antibodies with no further workup felt necessary at that time   . Hypertension   . Obesity   . Personal history of radiation therapy 2016   right  . Radiation 05/07/15-06/23/15   Right Breast   GYN HISTORY  Menarchal: 11 LMP: Several years ago Contraceptive: 1979-1986 HRT: NO G3P2:  SURGICAL HISTORY: Past Surgical History:  Procedure Laterality Date  . BREAST BIOPSY Left 02/10/2015   malignant   . BREAST LUMPECTOMY Right 03/21/2015  . CHOLECYSTECTOMY  2003  . CYSTOSCOPY W/ LITHOLAPAXY / EHL    . JOINT REPLACEMENT     bilateral knee replacement  . LEFT AND RIGHT HEART CATHETERIZATION WITH CORONARY ANGIOGRAM N/A 05/02/2012   Procedure: LEFT AND RIGHT  HEART CATHETERIZATION WITH CORONARY ANGIOGRAM;  Surgeon: CBurnell Blanks MD;  [facility];  Service: Cardiovascular;  Laterality: N/A;  . RADIOACTIVE SEED GUIDED PARTIAL MASTECTOMY WITH AXILLARY SENTINEL LYMPH NODE BIOPSY Right 03/21/2015   Procedure: RIGHT  PARTIAL MASTECTOMY WITH RADIOACTIVE SEED LOCALIZATION RIGHT  AXILLARY SENTINEL LYMPH NODE BIOPSY;  Surgeon: HFanny Skates MD;  [facility]  Service: General;  Laterality: Right;  . REPLACEMENT TOTAL KNEE BILATERAL  2005 &2006  . VASCULAR SURGERY Right 03/15/2013   Ultrasound guided sclerotherapy    SOCIAL HISTORY: History   Social History  . Marital Status: Widowed     Spouse Name: DQuillian Quince . Number of Children: 3  .  Years of Education: 14   Occupational History  . retired-CNA, housekeeping    Social History Main Topics  . Smoking status: Never Smoker   . Smokeless tobacco: Never Used  . Alcohol Use: No  . Drug Use: No  . Sexual Activity: No   Other Topics Concern  . Not on file   Social History Narrative   Health Care POA:    Emergency Contact: son, Haneen Bernales [phone]   End of Life Plan:    Who lives with you:  husband Darrall Dears- [place] is his primary care giver   Any pets: Adams   Diet: Pt has a varied diet.  Is currently working on smaller portions sizes and no late night snacking for weight loss.   Exercise: Pt exercises 1x weekly with church group.   Seatbelts: Pt reports wearing seatbelt when in vehicles.    Hobbies: word searches, church, time with family and friends             FAMILY HISTORY: Family History  Problem Relation Age of Onset  . Diabetes Mother   . Stroke Mother   . Heart disease Father   . Anemia Father   . Cancer Sister 17       nose cancer   . Cancer Cousin 30       ovarian cancer   . Cancer Cousin 15       ovarian cancer     ALLERGIES:  has No Known Allergies.  MEDICATIONS:  Current Outpatient Medications    Medication Sig Dispense Refill  . acetaminophen (TYLENOL 8 HOUR ARTHRITIS PAIN) 650 MG CR tablet Take 650 mg by mouth every 8 (eight) hours as needed for pain.    Marland Kitchen albuterol (PROVENTIL HFA;VENTOLIN HFA) 108 (90 Base) MCG/ACT inhaler Inhale 2 puffs into the lungs every 6 (six) hours as needed for wheezing. 1 Inhaler 3  . allopurinol (ZYLOPRIM) 100 MG tablet Take 1 tablet (100 mg total) by mouth daily. 90 tablet 1  . aspirin 81 MG tablet Take 81 mg by mouth daily.    Marland Kitchen atorvastatin (LIPITOR) 40 MG tablet Take 1 tablet (40 mg total) by mouth daily. 90 tablet 3  . benazepril (LOTENSIN) 20 MG tablet TAKE 1 TABLET BY MOUTH ONCE DAILY 90 tablet 3  . colchicine 0.6 MG tablet TAKE 1 TABLET BY MOUTH ONCE DAILY 90 tablet 1  . dorzolamide-timolol (COSOPT) 22.3-6.8 MG/ML ophthalmic solution Place 1 drop into both eyes 2 (two) times daily.  0  . exemestane (AROMASIN) 25 MG tablet TAKE 1 TABLET BY MOUTH ONCE DAILY AFTER BREAKFAST 90 tablet 3  . fluticasone (FLOVENT HFA) 110 MCG/ACT inhaler INHALE 2 PUFFS INTO THE LUNGS 2 TIMES DAILY 1 Inhaler 11  . furosemide (LASIX) 20 MG tablet TAKE 1 TABLET BY MOUTH ONCE DAILY 90 tablet 2  . gabapentin (NEURONTIN) 100 MG capsule TAKE 4 TO 5 CAPSULES BY MOUTH 3 TIMES DAILY 450 capsule 1  . latanoprost (XALATAN) 0.005 % ophthalmic solution Place 1 drop into both eyes 4 (four) times daily.   0  . metFORMIN (GLUCOPHAGE) 1000 MG tablet TAKE 1 TABLET BY MOUTH TWICE A DAY WITH FOOD 180 tablet 3  . metoprolol succinate (TOPROL-XL) 25 MG 24 hr tablet Take 1 tablet (25 mg total) by mouth daily. 90 tablet 3  . spironolactone (ALDACTONE) 25 MG tablet TAKE 1 TABLET BY MOUTH ONCE DAILY 90 tablet 1  . sulfacetamide (BLEPH-10) 10 % ophthalmic solution INT 1 GTT IN OD QID  1   No current facility-administered medications for this visit.     REVIEW OF SYSTEMS:   Constitutional: Denies fevers, chills or abnormal night sweats  Eyes: Denies blurriness of vision, double vision or watery  eyes Ears, nose, mouth, throat, and face: Denies mucositis or sore throat Respiratory: Denies cough, dyspnea or wheezes Cardiovascular: Denies palpitation, chest discomfort or lower extremity swelling Gastrointestinal:  Denies nausea, heartburn (+) diarrhea Skin: Denies abnormal skin rashes Lymphatics: Denies new lymphadenopathy or easy bruising Neurological:Denies numbness, tingling or new weaknesses  Behavioral/Psych: Mood is stable, no new changes  Musculoskeletal: (+) generalized joint pain, worse in hands All other systems were reviewed with the patient and are negative.  PHYSICAL EXAMINATION: ECOG PERFORMANCE STATUS: 2 - Symptomatic, <50% confined to bed  Vitals:   09/07/18 0931  BP: (!) 141/79  Pulse: (!) 44  Resp: 16  Temp: 97.7 F (36.5 C)  SpO2: 97%   Filed Weights   09/07/18 0931  Weight: 221 lb (100.2 kg)    GENERAL:alert, no distress and comfortable (+) uses a walker SKIN: skin color, texture, turgor are normal, no rashes or significant lesions (+) small subcutaneous lump on the left axillary fold EYES: normal, conjunctiva are pink and non-injected, sclera clear OROPHARYNX:no exudate, no erythema and lips, buccal mucosa, and tongue normal  NECK: supple, thyroid normal size, non-tender, without nodularity LYMPH:  no palpable lymphadenopathy in the cervical, axillary or inguinal (+) lower extremity edema  LUNGS: clear to auscultation and percussion with normal breathing effort HEART: regular rate & rhythm and no murmurs and no lower extremity edema ABDOMEN:abdomen soft, non-tender and normal bowel sounds Musculoskeletal:no cyanosis of digits and no clubbing  PSYCH: alert & oriented x 3 with fluent speech NEURO: no focal motor/sensory deficits Breasts: No new palpable masses, skin changes, or nipple discharge   LABORATORY DATA:  I have reviewed the data as listed  CBC Latest Ref Rng & Units 09/07/2018 08/03/2017 06/17/2017  WBC 4.0 - 10.5 K/uL 9.3 8.1 -    Hemoglobin 12.0 - 15.0 g/dL 11.1(L) 11.5(L) 11.3(A)  Hematocrit 36.0 - 46.0 % 36.4 37.4 -  Platelets 150 - 400 K/uL 173 185 -   CMP Latest Ref Rng & Units 09/07/2018 05/16/2018 01/06/2018  Glucose 70 - 99 mg/dL 155(H) 135(H) 169(H)  BUN 8 - 23 mg/dL 29(H) 25 18  Creatinine 0.44 - 1.00 mg/dL 1.36(H) 1.34(H) 0.95  Sodium 135 - 145 mmol/L 145 141 143  Potassium 3.5 - 5.1 mmol/L 4.7 4.7 4.2  Chloride 98 - 111 mmol/L 111 106 106  CO2 22 - 32 mmol/L 23 19(L) 19(L)  Calcium 8.9 - 10.3 mg/dL 9.2 9.3 9.0  Total Protein 6.5 - 8.1 g/dL 7.6 - 6.9  Total Bilirubin 0.3 - 1.2 mg/dL 0.4 - 0.4  Alkaline Phos 38 - 126 U/L 131(H) - 116  AST 15 - 41 U/L 16 - 14  ALT 0 - 44 U/L 12 - 8   Pathology 03/21/2015 Diagnosis 1. Breast, lumpectomy, right - INVASIVE DUCTAL CARCINOMA, SEE COMMENT. - DUCTAL CARCINOMA IN SITU. - NEGATIVE FOR LYMPH VASCULAR INVASION. - PREVIOUS BIOPSY SITE IDENTIFIED. - SEE TUMOR SYNOPTIC TEMPLATE BELOW. 2. Lymph node, sentinel, biopsy, right axillary - ONE LYMPH NODE, NEGATIVE FOR TUMOR (0/1).  1. BREAST, INVASIVE TUMOR, WITH LYMPH NODES PRESENT Specimen, including laterality and lymph node sampling (sentinel, non-sentinel): Right breast with sentinel lymph node sampling. Procedure: Lumpectomy. Histologic type: Grade: I of III Tubule formation: 1 Nuclear pleomorphism: 2 Mitotic:1 Tumor size (gross measurement): 1.1 cm  Margins: Invasive, distance to closest margin: 0.9 cm (medial). In-situ, distance to closest margin: Same as invasive. If margin positive, focally or broadly: N/A Lymphovascular invasion: Absent. Ductal carcinoma in situ: Present. Grade: I of III Extensive intraductal component: Absent. Lobular neoplasia: Absent. Tumor focality: Unifocal. Treatment effect: None. If present, treatment effect in breast tissue, lymph nodes or both: N/A Extent of tumor: Skin: N/A Nipple: N/A Skeletal muscle: N/A 2 of 4 FINAL for ANNISON, BIRCHARD  ([id number]) Microscopic Comment(continued) Lymph nodes: Examined: 1 Sentinel 0 Non-sentinel 1 Total Lymph nodes with metastasis: 0 Isolated tumor cells (< 0.2 mm): N/A Micrometastasis: (> 0.2 mm and < 2.0 mm): N/A Macrometastasis: (> 2.0 mm): N/A Extracapsular extension: N/A Breast prognostic profile: Estrogen receptor: Not repeated, previous study demonstrated 100% positivity ([id number]). Progesterone receptor: Not repeated, previous study demonstrated 60% positivity ([id number]). Her 2 neu: Repeated, previous study demonstrated no amplification (1.70) ([id number]). Ki-67: Not repeated, previous study demonstrated 3% proliferation rate ([id number]). Non-neoplastic breast: Previous biopsy site related tissue changes. TNM: pT1c, pN0, pMX Results: IMMUNOHISTOCHEMICAL AND MORPHOMETRIC ANALYSIS BY THE AUTOMATED CELLULAR IMAGING SYSTEM (ACIS) Estrogen Receptor: 100%, POSITIVE, STRONG STAINING INTENSITY Progesterone Receptor: 73%, POSITIVE, STRONG STAINING INTENSITY Proliferation Marker Ki67: 6% 1. CHROMOGENIC IN-SITU HYBRIDIZATION Results: HER-2/NEU BY CISH - NEGATIVE. RESULT RATIO OF HER2: CEP 17 SIGNALS 1.30 AVERAGE HER2 COPY NUMBER PER CELL 1.95  Oncotype Dx: RS 7, predicts 5% 10-year risk of distant recurrence with tamoxifen alone, low risk   RADIOGRAPHIC STUDIES: I have personally reviewed the radiological images as listed and agreed with the findings in the report.  Diagnostic Mammogram 01/31/18 IMPRESSION: No mammographic evidence for malignancy.  DEXA 01/31/18 ASSESSMENT: The BMD measured at Forearm Radius 33% is 0.808 g/cm2 with a T-score of -0.9.  01/27/2017 Mammogram  IMPRESSION: Stable right breast lumpectomy site. No mammographic evidence of malignancy in the bilateral breasts.  Bone density scan 09/03/2015 ASSESSMENT: The BMD measured at Forearm Radius 33% is 0.925 g/cm2 with a T-score of 0.4. This patient is considered NORMAL according to [facility]) criteria.Lumbar spine was not utilized due to advanced degenerative changes.  ASSESSMENT & PLAN:  67 y.o. postmenopausal African-American American female, screening detected right breast invasive ductal carcinoma and DCIS.  1. Breast cancer of lower-outer quadrant of right breast, invasive ductal carcinoma, pT1c N0 M0, stage IA, ER positive PR positive HER-2 negative, Ki 67 6%, and DCIS -I previously discussed her imaging findings and surgical pathology results with her and her daughters in great details. -She has early-stage breast cancer, high possibility of cured by the complete surgical resection completed in 03/21/15. -Reviewed the possibility of local and distant recurrence after surgery, although it's low, we recommend adjuvant radiation and endocrine therapy to reduce her risk. -Her Oncotype was low risk, no need adjuvant chemotherapy -She has started Aromasin on 06/02/15, tolerating well so far, we'll continue, plan for total 5 years. -She is clinically doing well.  -Labs reviewed, CBC showed Hg 11.1, CMP pending. Her breast exam was unremarkable. Her 01/31/18 mammogram showed no evidence of malignancy. There is no clinical concerns of recurrence. -Continue breast cancer surveillance with annual mammogram, self and physician breast exam, I encouraged her to have healthy diet and to be physically active -Next mammogram in 01/2019 -f/u in 6 months  2. Bone health -I have discussed the side effect of osteopenia and osteoporosis from Aromasin -Her bone density scan in 08/2015 was normal. We will continue monitoring every 2 years -DEXA from 01/31/18 revealed a T Score of -  0.9 -I encouraged her to continue to take Vitamin D or multivitamin  3. Hypertension, diabetes, arthritis, CHF -She will continue follow-up with her primary care physician -She is bradycardiac, I advised her to see her Cardiologist to adjust her medications. She denies feeling dizzy.  -I will order  EKG to r/u conduction abnormalities  4. CKD stage III  -Her GFR is in 40s, stable overall, continue monitoring -previously advised to avoid nephrotoxin  -She follows up with nephrology, kidney function overall stable.   5. Unstable gait  -she has had this issue for several years, previous was seen by neurologist Dr. Rexene Alberts  -Previous brain MRI was negative -Follow-up with primary care physician, she was recently referred to physical therapy, I previously encouraged her to dissipate  6. Anemia -She has mild chronic normocytic anemia, Probably secondary to her CKD -Her prior study in 2014 showed elevated ferritin, normal iron -Overall stable, hemoglobin at 11.1 today, we'll continue monitoring.   7.  Bradycardia -She was found to be bradycardia on the vital sign, she was asymmetric. -Obtain a EKG during her visit, which showed sinus rhythm 86, with frequent premature ventricular complexes.  No significant ST change on the EKG. -I will inform her PCP  Plan -Continue Exemestane  -Lab and f/u in 6 months  -Mammogram in 01/2019 -EKG due to bradycardia    All questions were answered. The patient knows to call the clinic with any problems, questions or concerns.  I spent 20 minutes counseling the patient face to face. The total time spent in the appointment was 25 minutes and more than 50% was on counseling.  Dierdre Searles Dweik am acting as scribe for Dr. Truitt Merle.  I have reviewed the above documentation for accuracy and completeness, and I agree with the above.     Truitt Merle, MD 09/07/2018 7:21 PM

## 2018-09-07 ENCOUNTER — Encounter: Payer: Self-pay | Admitting: Hematology

## 2018-09-07 ENCOUNTER — Telehealth: Payer: Self-pay | Admitting: *Deleted

## 2018-09-07 ENCOUNTER — Telehealth: Payer: Self-pay | Admitting: Hematology

## 2018-09-07 ENCOUNTER — Telehealth: Payer: Self-pay

## 2018-09-07 ENCOUNTER — Inpatient Hospital Stay: Payer: Medicare Other | Attending: Hematology

## 2018-09-07 ENCOUNTER — Inpatient Hospital Stay (HOSPITAL_BASED_OUTPATIENT_CLINIC_OR_DEPARTMENT_OTHER): Payer: Medicare Other | Admitting: Hematology

## 2018-09-07 VITALS — BP 141/79 | HR 44 | Temp 97.7°F | Resp 16 | Ht 62.0 in | Wt 221.0 lb

## 2018-09-07 DIAGNOSIS — M1990 Unspecified osteoarthritis, unspecified site: Secondary | ICD-10-CM | POA: Insufficient documentation

## 2018-09-07 DIAGNOSIS — I130 Hypertensive heart and chronic kidney disease with heart failure and stage 1 through stage 4 chronic kidney disease, or unspecified chronic kidney disease: Secondary | ICD-10-CM | POA: Diagnosis not present

## 2018-09-07 DIAGNOSIS — C50511 Malignant neoplasm of lower-outer quadrant of right female breast: Secondary | ICD-10-CM | POA: Diagnosis not present

## 2018-09-07 DIAGNOSIS — Z79811 Long term (current) use of aromatase inhibitors: Secondary | ICD-10-CM

## 2018-09-07 DIAGNOSIS — I10 Essential (primary) hypertension: Secondary | ICD-10-CM

## 2018-09-07 DIAGNOSIS — I5042 Chronic combined systolic (congestive) and diastolic (congestive) heart failure: Secondary | ICD-10-CM

## 2018-09-07 DIAGNOSIS — D649 Anemia, unspecified: Secondary | ICD-10-CM | POA: Insufficient documentation

## 2018-09-07 DIAGNOSIS — N183 Chronic kidney disease, stage 3 (moderate): Secondary | ICD-10-CM

## 2018-09-07 DIAGNOSIS — R001 Bradycardia, unspecified: Secondary | ICD-10-CM | POA: Diagnosis not present

## 2018-09-07 DIAGNOSIS — Z170 Estrogen receptor positive status [ER+]: Secondary | ICD-10-CM

## 2018-09-07 DIAGNOSIS — R269 Unspecified abnormalities of gait and mobility: Secondary | ICD-10-CM | POA: Diagnosis not present

## 2018-09-07 DIAGNOSIS — I509 Heart failure, unspecified: Secondary | ICD-10-CM | POA: Diagnosis not present

## 2018-09-07 DIAGNOSIS — E1122 Type 2 diabetes mellitus with diabetic chronic kidney disease: Secondary | ICD-10-CM | POA: Diagnosis not present

## 2018-09-07 LAB — CBC WITH DIFFERENTIAL/PLATELET
ABS IMMATURE GRANULOCYTES: 0.04 10*3/uL (ref 0.00–0.07)
Basophils Absolute: 0.1 10*3/uL (ref 0.0–0.1)
Basophils Relative: 1 %
Eosinophils Absolute: 0.4 10*3/uL (ref 0.0–0.5)
Eosinophils Relative: 4 %
HCT: 36.4 % (ref 36.0–46.0)
HEMOGLOBIN: 11.1 g/dL — AB (ref 12.0–15.0)
Immature Granulocytes: 0 %
LYMPHS ABS: 3.2 10*3/uL (ref 0.7–4.0)
Lymphocytes Relative: 34 %
MCH: 30.7 pg (ref 26.0–34.0)
MCHC: 30.5 g/dL (ref 30.0–36.0)
MCV: 100.6 fL — ABNORMAL HIGH (ref 80.0–100.0)
MONO ABS: 0.6 10*3/uL (ref 0.1–1.0)
MONOS PCT: 7 %
NEUTROS ABS: 5 10*3/uL (ref 1.7–7.7)
Neutrophils Relative %: 54 %
Platelets: 173 10*3/uL (ref 150–400)
RBC: 3.62 MIL/uL — AB (ref 3.87–5.11)
RDW: 14.8 % (ref 11.5–15.5)
WBC: 9.3 10*3/uL (ref 4.0–10.5)
nRBC: 0 % (ref 0.0–0.2)

## 2018-09-07 LAB — CMP (CANCER CENTER ONLY)
ALBUMIN: 3.9 g/dL (ref 3.5–5.0)
ALT: 12 U/L (ref 0–44)
AST: 16 U/L (ref 15–41)
Alkaline Phosphatase: 131 U/L — ABNORMAL HIGH (ref 38–126)
Anion gap: 11 (ref 5–15)
BUN: 29 mg/dL — AB (ref 8–23)
CHLORIDE: 111 mmol/L (ref 98–111)
CO2: 23 mmol/L (ref 22–32)
Calcium: 9.2 mg/dL (ref 8.9–10.3)
Creatinine: 1.36 mg/dL — ABNORMAL HIGH (ref 0.44–1.00)
GFR, Est AFR Am: 45 mL/min — ABNORMAL LOW (ref >60)
GFR, Estimated: 39 mL/min — ABNORMAL LOW (ref >60)
GLUCOSE: 155 mg/dL — AB (ref 70–99)
POTASSIUM: 4.7 mmol/L (ref 3.5–5.1)
Sodium: 145 mmol/L (ref 135–145)
Total Bilirubin: 0.4 mg/dL (ref 0.3–1.2)
Total Protein: 7.6 g/dL (ref 6.5–8.1)

## 2018-09-07 MED ORDER — EXEMESTANE 25 MG PO TABS: ORAL_TABLET | ORAL | 3 refills | Status: DC

## 2018-09-07 NOTE — Telephone Encounter (Signed)
Pt wanted to let the MD know that her Pulse was 44 today at her oncologist office.  They suggested that he may need to change her meds.   Pt is not lightheaded or having SOB / CP. [name], [name], CMA

## 2018-09-07 NOTE — Telephone Encounter (Signed)
Appts scheduled avs/calendar printed per 10/10 los °

## 2018-09-07 NOTE — Telephone Encounter (Signed)
Printed avs and calender of upcoming appointment. Per 10/10 los pre scheduled by Sharyne Peach.

## 2018-09-08 MED ORDER — METOPROLOL SUCCINATE ER 25 MG PO TB24: 25.0000 mg | ORAL_TABLET | Freq: Every day | ORAL | 3 refills | Status: DC

## 2018-09-08 NOTE — Telephone Encounter (Signed)
Pt informed. [name], CMA  

## 2018-09-08 NOTE — Telephone Encounter (Signed)
Please call her and ask her to cut her metoprolol 25 mg in half and take one half each day Thanks  LC

## 2018-09-15 DIAGNOSIS — Z961 Presence of intraocular lens: Secondary | ICD-10-CM | POA: Diagnosis not present

## 2018-09-15 DIAGNOSIS — E119 Type 2 diabetes mellitus without complications: Secondary | ICD-10-CM | POA: Diagnosis not present

## 2018-09-15 DIAGNOSIS — H538 Other visual disturbances: Secondary | ICD-10-CM | POA: Diagnosis not present

## 2018-09-22 ENCOUNTER — Encounter: Payer: Self-pay | Admitting: Podiatry

## 2018-09-22 ENCOUNTER — Ambulatory Visit (INDEPENDENT_AMBULATORY_CARE_PROVIDER_SITE_OTHER): Payer: Medicare Other | Admitting: Podiatry

## 2018-09-22 DIAGNOSIS — E1140 Type 2 diabetes mellitus with diabetic neuropathy, unspecified: Secondary | ICD-10-CM | POA: Diagnosis not present

## 2018-09-22 DIAGNOSIS — B351 Tinea unguium: Secondary | ICD-10-CM

## 2018-09-22 NOTE — Progress Notes (Signed)
Patient ID: [name], female   DOB: 09/30/1951, 67 y.o.   MRN: [id number] Complaint:  Visit Type: Patient returns to the office for preventative foot care services.  Patient states nails are painful walking and wearing her shoes.  She is unable to self treat.  Patient is diabetic.   Podiatric Exam: Vascular: dorsalis pedis and posterior tibial pulses are palpable bilateral. Capillary return is immediate. Temperature gradient is WNL. Skin turgor WNL  Sensorium: Diminished Semmes Weinstein monofilament test. Normal tactile sensation bilaterally. Nail Exam: Pt has thick disfigured discolored nails second and third toenails left foot. Ulcer Exam: There is no evidence of ulcer or pre-ulcerative changes or infection. Orthopedic Exam: Muscle tone and strength are WNL. No limitations in general ROM. No crepitus or effusions noted. Foot type and digits show no abnormalities. . Asymptomatic HAV B/L.  Pes planus noted. Skin: No Porokeratosis. No infection or ulcers  Diagnosis:  Onychomycosis  Diabetes  HAV  B/L  Pes planus  B/L  Treatment & Plan Procedures and Treatment: Debridement and grinding of long painful nails both feet.. .RTC 3 months.  Patient likes her new diabetic shoes.  Return Visit-Office Procedure: Patient instructed to return to the office for a follow up visit for scheduled preventive foot care services.   Buna Cuppett DPM 

## 2018-09-23 ENCOUNTER — Emergency Department (HOSPITAL_COMMUNITY)
Admission: EM | Admit: 2018-09-23 | Discharge: 2018-09-23 | Disposition: A | Discharge status: Home / Self Care | Payer: Medicare Other | Attending: Emergency Medicine | Admitting: Emergency Medicine

## 2018-09-23 ENCOUNTER — Other Ambulatory Visit: Payer: Self-pay

## 2018-09-23 ENCOUNTER — Emergency Department (HOSPITAL_COMMUNITY): Payer: Medicare Other

## 2018-09-23 DIAGNOSIS — W01190A Fall on same level from slipping, tripping and stumbling with subsequent striking against furniture, initial encounter: Secondary | ICD-10-CM | POA: Insufficient documentation

## 2018-09-23 DIAGNOSIS — R404 Transient alteration of awareness: Secondary | ICD-10-CM | POA: Diagnosis not present

## 2018-09-23 DIAGNOSIS — Z23 Encounter for immunization: Secondary | ICD-10-CM | POA: Diagnosis not present

## 2018-09-23 DIAGNOSIS — S0990XA Unspecified injury of head, initial encounter: Secondary | ICD-10-CM

## 2018-09-23 DIAGNOSIS — I10 Essential (primary) hypertension: Secondary | ICD-10-CM | POA: Diagnosis not present

## 2018-09-23 DIAGNOSIS — Y999 Unspecified external cause status: Secondary | ICD-10-CM | POA: Insufficient documentation

## 2018-09-23 DIAGNOSIS — S0101XA Laceration without foreign body of scalp, initial encounter: Secondary | ICD-10-CM | POA: Insufficient documentation

## 2018-09-23 DIAGNOSIS — Y939 Activity, unspecified: Secondary | ICD-10-CM | POA: Diagnosis not present

## 2018-09-23 DIAGNOSIS — I4581 Long QT syndrome: Secondary | ICD-10-CM | POA: Diagnosis not present

## 2018-09-23 DIAGNOSIS — S199XXA Unspecified injury of neck, initial encounter: Secondary | ICD-10-CM | POA: Diagnosis not present

## 2018-09-23 DIAGNOSIS — R4781 Slurred speech: Secondary | ICD-10-CM | POA: Diagnosis not present

## 2018-09-23 DIAGNOSIS — Y929 Unspecified place or not applicable: Secondary | ICD-10-CM | POA: Diagnosis not present

## 2018-09-23 LAB — CBC WITH DIFFERENTIAL/PLATELET
Abs Immature Granulocytes: 0.04 10*3/uL (ref 0.00–0.07)
BASOS PCT: 1 %
Basophils Absolute: 0.1 10*3/uL (ref 0.0–0.1)
EOS ABS: 0.4 10*3/uL (ref 0.0–0.5)
EOS PCT: 4 %
HCT: 37.4 % (ref 36.0–46.0)
Hemoglobin: 10.8 g/dL — ABNORMAL LOW (ref 12.0–15.0)
Immature Granulocytes: 1 %
Lymphocytes Relative: 35 %
Lymphs Abs: 3 10*3/uL (ref 0.7–4.0)
MCH: 29 pg (ref 26.0–34.0)
MCHC: 28.9 g/dL — ABNORMAL LOW (ref 30.0–36.0)
MCV: 100.5 fL — AB (ref 80.0–100.0)
MONO ABS: 0.8 10*3/uL (ref 0.1–1.0)
MONOS PCT: 9 %
Neutro Abs: 4.2 10*3/uL (ref 1.7–7.7)
Neutrophils Relative %: 50 %
Platelets: 192 10*3/uL (ref 150–400)
RBC: 3.72 MIL/uL — AB (ref 3.87–5.11)
RDW: 13.9 % (ref 11.5–15.5)
WBC: 8.4 10*3/uL (ref 4.0–10.5)
nRBC: 0 % (ref 0.0–0.2)

## 2018-09-23 LAB — COMPREHENSIVE METABOLIC PANEL
ALK PHOS: 89 U/L (ref 38–126)
ALT: 15 U/L (ref 0–44)
AST: 20 U/L (ref 15–41)
Albumin: 3.9 g/dL (ref 3.5–5.0)
Anion gap: 12 (ref 5–15)
BUN: 30 mg/dL — ABNORMAL HIGH (ref 8–23)
CALCIUM: 9.3 mg/dL (ref 8.9–10.3)
CHLORIDE: 108 mmol/L (ref 98–111)
CO2: 19 mmol/L — AB (ref 22–32)
Creatinine, Ser: 1.22 mg/dL — ABNORMAL HIGH (ref 0.44–1.00)
GFR, EST AFRICAN AMERICAN: 51 mL/min — AB (ref >60)
GFR, EST NON AFRICAN AMERICAN: 44 mL/min — AB (ref >60)
Glucose, Bld: 119 mg/dL — ABNORMAL HIGH (ref 70–99)
Potassium: 3.7 mmol/L (ref 3.5–5.1)
SODIUM: 139 mmol/L (ref 135–145)
Total Bilirubin: 0.7 mg/dL (ref 0.3–1.2)
Total Protein: 7 g/dL (ref 6.5–8.1)

## 2018-09-23 LAB — I-STAT TROPONIN, ED: TROPONIN I, POC: 0.01 ng/mL (ref 0.00–0.08)

## 2018-09-23 LAB — CBG MONITORING, ED: GLUCOSE-CAPILLARY: 112 mg/dL — AB (ref 70–99)

## 2018-09-23 MED ORDER — CEPHALEXIN 500 MG PO CAPS: 500.0000 mg | ORAL_CAPSULE | Freq: Two times a day (BID) | ORAL | 0 refills | Status: DC

## 2018-09-23 MED ORDER — CEFAZOLIN SODIUM-DEXTROSE 1-4 GM/50ML-% IV SOLN
1.0000 g | Freq: Once | INTRAVENOUS | Status: AC
  Administered 2018-09-23: 1 g via INTRAVENOUS
  Filled 2018-09-23: qty 50

## 2018-09-23 MED ORDER — FENTANYL CITRATE (PF) 100 MCG/2ML IJ SOLN
50.0000 ug | Freq: Once | INTRAMUSCULAR | Status: AC
  Administered 2018-09-23: 50 ug via INTRAVENOUS
  Filled 2018-09-23: qty 2

## 2018-09-23 MED ORDER — TRAMADOL HCL 50 MG PO TABS: 50.0000 mg | ORAL_TABLET | Freq: Four times a day (QID) | ORAL | 0 refills | Status: DC | PRN

## 2018-09-23 MED ORDER — LIDOCAINE-EPINEPHRINE (PF) 2 %-1:200000 IJ SOLN
20.0000 mL | Freq: Once | INTRAMUSCULAR | Status: AC
  Administered 2018-09-23: 20 mL
  Filled 2018-09-23 (×2): qty 20

## 2018-09-23 MED ORDER — SODIUM CHLORIDE 0.9 % IV BOLUS
1000.0000 mL | Freq: Once | INTRAVENOUS | Status: AC
  Administered 2018-09-23: 1000 mL via INTRAVENOUS

## 2018-09-23 MED ORDER — TETANUS-DIPHTH-ACELL PERTUSSIS 5-2.5-18.5 LF-MCG/0.5 IM SUSP
0.5000 mL | Freq: Once | INTRAMUSCULAR | Status: AC
  Administered 2018-09-23: 0.5 mL via INTRAMUSCULAR
  Filled 2018-09-23: qty 0.5

## 2018-09-23 NOTE — ED Notes (Signed)
Patient transported to CT 

## 2018-09-23 NOTE — Progress Notes (Signed)
Responded to level 2 pg, ck'd w/ RN about notifying family, told they were on scene, ck'd in w/ charge RN and she will pg chaplain if needed.

## 2018-09-23 NOTE — ED Notes (Signed)
This RN called CT, was told she is next to go.

## 2018-09-23 NOTE — Discharge Instructions (Signed)
Take keflex twice daily for 5 days to prevent infection   Take tylenol for pain   Take tramadol for severe pain   Return in a week for suture removal   Return to ED sooner if you have another fall, severe headaches, vomiting, lethargy

## 2018-09-23 NOTE — Progress Notes (Signed)
Orthopedic Tech Progress Note Patient Details:  [name] [birth year] [id number]  Patient ID: [name], female   DOB: Apr 17, 1951, 67 y.o.   MRN: [id number]   [name] 09/23/2018, 5:54 PM Made level 2 trauma visit

## 2018-09-23 NOTE — ED Provider Notes (Signed)
[facility] EMERGENCY DEPARTMENT Provider Note   CSN: [id number] Arrival date & time: 09/23/18  1745     History   Chief Complaint No chief complaint on file.   HPI Lylia Karn is a 67 y.o. female history of previous breast cancer status post mastectomy, here presenting with fall.  Patient states that she was trying to get to the door and slipped and fell and hit the right side of her head on a cabinet door.  Patient denies passing out or loss of consciousness or vomiting.  Patient was noted to have some stuttering speech afterwards but GCS was 14.  Per the family, patient has been having some slurring speech over the last several weeks and this is not new.  EMS noticed that she had profuse bleeding from the right scalp wound. Unknown tdap.   The history is provided by the patient.    No past medical history on file.  There are no active problems to display for this patient.    OB History   None      Home Medications    Prior to Admission medications   Not on File    Family History No family history on file.  Social History Social History   Tobacco Use  . Smoking status: Not on file  Substance Use Topics  . Alcohol use: Not on file  . Drug use: Not on file     Allergies   Patient has no allergy information on record.   Review of Systems Review of Systems  Skin: Positive for wound.  All other systems reviewed and are negative.    Physical Exam Updated Vital Signs BP (!) 137/96   Pulse 81   Temp 97.9 F (36.6 C) (Oral)   Resp 20   Ht 5\' 6"  (1.676 m)   Wt 117.9 kg   SpO2 99%   BMI 41.97 kg/m   Physical Exam  Constitutional: She is oriented to person, place, and time.  Uncomfortable, some stuttering speech  HENT:  5 cm laceration R forehead with active bleeding   Eyes: Pupils are equal, round, and reactive to light. Conjunctivae and EOM are normal.  Neck: Normal range of motion. Neck supple.  Cardiovascular: Normal  rate, regular rhythm and normal heart sounds.  Pulmonary/Chest: Effort normal and breath sounds normal. No respiratory distress.  Abdominal: Soft. Bowel sounds are normal.  Musculoskeletal: Normal range of motion.  Neurological: She is alert and oriented to person, place, and time. No cranial nerve deficit.  Skin: Skin is warm.  Psychiatric: She has a normal mood and affect.  Nursing note and vitals reviewed.    ED Treatments / Results  Labs (all labs ordered are listed, but only abnormal results are displayed) Labs Reviewed  CBC WITH DIFFERENTIAL/PLATELET - Abnormal; Notable for the following components:      Result Value   RBC 3.72 (*)    Hemoglobin 10.8 (*)    MCV 100.5 (*)    MCHC 28.9 (*)    All other components within normal limits  COMPREHENSIVE METABOLIC PANEL - Abnormal; Notable for the following components:   CO2 19 (*)    Glucose, Bld 119 (*)    BUN 30 (*)    Creatinine, Ser 1.22 (*)    GFR calc non Af Amer 44 (*)    GFR calc Af Amer 51 (*)    All other components within normal limits  CBG MONITORING, ED - Abnormal; Notable for the following components:  Glucose-Capillary 112 (*)    All other components within normal limits  I-STAT TROPONIN, ED    EKG EKG Interpretation  Date/Time:  Saturday September 23 2018 17:54:02 EDT Ventricular Rate:  88 PR Interval:    QRS Duration: 112 QT Interval:  403 QTC Calculation: 488 R Axis:   -8 Text Interpretation:  Sinus rhythm Abnormal R-wave progression, early transition LVH with IVCD and secondary repol abnrm Borderline prolonged QT interval No significant change since last tracing Confirmed by Wandra Arthurs [phone]) on 09/23/2018 6:04:25 PM   Radiology Ct Head Wo Contrast  Result Date: 09/23/2018 CLINICAL DATA:  Patient arrived by [facility] after slipping and falling backwards onto hardwood flooring, unsure if loc. Patient arrived with large head wound with active bleeding. Lost approximately 300cc of blood at home per  EMS. Patient stuttering on arrival and EMS reports family states that this is abnormal. Patient alert and oriented on arrival EXAM: CT HEAD WITHOUT CONTRAST CT CERVICAL SPINE WITHOUT CONTRAST TECHNIQUE: Multidetector CT imaging of the head and cervical spine was performed following the standard protocol without intravenous contrast. Multiplanar CT image reconstructions of the cervical spine were also generated. COMPARISON:  None. FINDINGS: CT HEAD FINDINGS Brain: No evidence of acute infarction, hemorrhage, hydrocephalus, extra-axial collection or mass lesion/mass effect. Vascular: No hyperdense vessel or unexpected calcification. Skull: Normal. Negative for fracture or focal lesion. Sinuses/Orbits: Globes and orbits are unremarkable. Mild polypoid mucosal thickening along the floor of the maxillary sinuses. Sinuses otherwise clear. Clear mastoid air cells. Other: Right superior and lateral parietal scalp lacerations/hematomas. No radiopaque foreign body. CT CERVICAL SPINE FINDINGS Alignment: Normal. Skull base and vertebrae: No acute fracture. No primary bone lesion or focal pathologic process. Soft tissues and spinal canal: No prevertebral fluid or swelling. No visible canal hematoma. Disc levels: Mild moderate loss of disc height at C5-C6 and C6-C7. There are prominent anterior endplate osteophytes from C4 through C7. No convincing disc herniation. No stenosis. Upper chest: No acute finding. No mass or adenopathy. Lung apices are clear. Other: None. IMPRESSION: HEAD CT 1. No intracranial abnormality. 2. No skull fracture. 3. Right lateral and posterior parietal scalp laceration/hematoma. CERVICAL CT 1. No fracture or acute finding. Electronically Signed   By: Lajean Manes M.D.   On: 09/23/2018 19:32   Ct Cervical Spine Wo Contrast  Result Date: 09/23/2018 CLINICAL DATA:  Patient arrived by [facility] after slipping and falling backwards onto hardwood flooring, unsure if loc. Patient arrived with large head wound  with active bleeding. Lost approximately 300cc of blood at home per EMS. Patient stuttering on arrival and EMS reports family states that this is abnormal. Patient alert and oriented on arrival EXAM: CT HEAD WITHOUT CONTRAST CT CERVICAL SPINE WITHOUT CONTRAST TECHNIQUE: Multidetector CT imaging of the head and cervical spine was performed following the standard protocol without intravenous contrast. Multiplanar CT image reconstructions of the cervical spine were also generated. COMPARISON:  None. FINDINGS: CT HEAD FINDINGS Brain: No evidence of acute infarction, hemorrhage, hydrocephalus, extra-axial collection or mass lesion/mass effect. Vascular: No hyperdense vessel or unexpected calcification. Skull: Normal. Negative for fracture or focal lesion. Sinuses/Orbits: Globes and orbits are unremarkable. Mild polypoid mucosal thickening along the floor of the maxillary sinuses. Sinuses otherwise clear. Clear mastoid air cells. Other: Right superior and lateral parietal scalp lacerations/hematomas. No radiopaque foreign body. CT CERVICAL SPINE FINDINGS Alignment: Normal. Skull base and vertebrae: No acute fracture. No primary bone lesion or focal pathologic process. Soft tissues and spinal canal: No prevertebral fluid or swelling.  No visible canal hematoma. Disc levels: Mild moderate loss of disc height at C5-C6 and C6-C7. There are prominent anterior endplate osteophytes from C4 through C7. No convincing disc herniation. No stenosis. Upper chest: No acute finding. No mass or adenopathy. Lung apices are clear. Other: None. IMPRESSION: HEAD CT 1. No intracranial abnormality. 2. No skull fracture. 3. Right lateral and posterior parietal scalp laceration/hematoma. CERVICAL CT 1. No fracture or acute finding. Electronically Signed   By: Lajean Manes M.D.   On: 09/23/2018 19:32    Procedures Procedures (including critical care time)  LACERATION REPAIR Performed by: Wandra Arthurs Authorized by: Wandra Arthurs Consent:  Verbal consent obtained. Risks and benefits: risks, benefits and alternatives were discussed Consent given by: patient Patient identity confirmed: provided demographic data Prepped and Draped in normal sterile fashion Wound explored  Laceration Location: R scalp   Laceration Length: 5 cm  No Foreign Bodies seen or palpated  Anesthesia: local infiltration  Local anesthetic: lidocaine 2% no epinephrine  Anesthetic total: 10 ml  Irrigation method: syringe Amount of cleaning: standard  Skin closure: 5-0 ethilon  Number of sutures: 5  Technique: simple interrupted   Patient tolerance: Patient tolerated the procedure well with no immediate complications.   Medications Ordered in ED Medications  lidocaine-EPINEPHrine (XYLOCAINE W/EPI) 2 %-1:200000 (PF) injection 20 mL (has no administration in time range)  sodium chloride 0.9 % bolus 1,000 mL (0 mLs Intravenous Stopped 09/23/18 1928)  fentaNYL (SUBLIMAZE) injection 50 mcg (50 mcg Intravenous Given 09/23/18 1815)  Tdap (BOOSTRIX) injection 0.5 mL (0.5 mLs Intramuscular Given 09/23/18 1815)  ceFAZolin (ANCEF) IVPB 1 g/50 mL premix (0 g Intravenous Stopped 09/23/18 1927)     Initial Impression / Assessment and Plan / ED Course  I have reviewed the triage vital signs and the nursing notes.  Pertinent labs & imaging results that were available during my care of the patient were reviewed by me and considered in my medical decision making (see chart for details).    Ayde Record is a 67 y.o. female here with fall with scalp laceration. She does have some stuttering speech but otherwise neurologically intact. Patient had stuttering speech prior to the fall. Will get labs, CT head/neck.  8:50 PM CT head/neck unremarkable. Labs unremarkable. Laceration sutured. Tdap updated. Given size of laceration, will give prophylactic abx. Suture removal in a week     Final Clinical Impressions(s) / ED Diagnoses   Final diagnoses:  None     ED Discharge Orders    None       Drenda Freeze, MD 09/23/18 2051

## 2018-09-23 NOTE — ED Triage Notes (Signed)
Patient arrived by [facility] after slipping and falling backwards onto hardwood flooring, unsure if loc. Patient arrived with large head wound with active bleeding. Lost approximately 300cc of blood at home per EMS. Patient stuttering on arrival and EMS reports family states that this is abnormal. Patient alert and oriented on arrival.

## 2018-09-25 ENCOUNTER — Telehealth: Payer: Self-pay | Admitting: *Deleted

## 2018-09-25 NOTE — Telephone Encounter (Signed)
Pt lm on nurse line.  She wants to speak with [name] or nurse about her recent ED visit.  Attempted to call pt back, no answer and VM had not been set up yet. Will await callback. Aydeen Blume, Salome Spotted, CMA

## 2018-09-26 NOTE — Telephone Encounter (Signed)
Patient had questions related to suture care and also needed an appt to remove sutures. Questions answered. Appt made. Danley Danker, RN [facility] RN)

## 2018-10-02 ENCOUNTER — Ambulatory Visit: Payer: Medicare Other

## 2018-10-02 DIAGNOSIS — Z4802 Encounter for removal of sutures: Secondary | ICD-10-CM

## 2018-10-02 NOTE — Progress Notes (Signed)
   Patient in to nurse clinic for suture removal. Received 5 sutures to right forehead on 09/23/18. Wound well healed and approximated. [name] in to assess and sutures were removed by RN without difficulty. Patient will apply band aid at home.  Danley Danker, RN [facility] RN)

## 2018-10-08 ENCOUNTER — Other Ambulatory Visit: Payer: Self-pay | Admitting: Family Medicine

## 2018-10-08 DIAGNOSIS — E785 Hyperlipidemia, unspecified: Secondary | ICD-10-CM

## 2018-10-10 ENCOUNTER — Encounter: Payer: Self-pay | Admitting: Gastroenterology

## 2018-10-10 ENCOUNTER — Ambulatory Visit (INDEPENDENT_AMBULATORY_CARE_PROVIDER_SITE_OTHER): Payer: Medicare Other | Admitting: Gastroenterology

## 2018-10-10 ENCOUNTER — Encounter (INDEPENDENT_AMBULATORY_CARE_PROVIDER_SITE_OTHER): Payer: Self-pay

## 2018-10-10 VITALS — BP 130/70 | HR 82 | Ht 62.0 in | Wt 219.0 lb

## 2018-10-10 DIAGNOSIS — R194 Change in bowel habit: Secondary | ICD-10-CM

## 2018-10-10 NOTE — Progress Notes (Signed)
HPI: This is a very pleasant 67 year old woman who was referred to me by Lind Covert, *  to evaluate chronic loose stools.    Chief complaint is chronic loose stools  Walks with a walker  Scared to go out because of her chronic loose stools  She goes 2-6 times per day.  Antidiarrheals will help.  Never bloody loose stools  She can have normal BM once daily.  Getting worse, wil have to wear depends undergarments because of intermittent incontinence   Has been on metformin for years, recently increased about a year ago  No significant abdominal pains  Overall weight fluctuates.    Old Data Reviewed:  Colonoscopy 2012, Dr. Ardis Hughs for routine screening found no polyps. + diverticulosis and internal hemorrhoids, she was recommended to consider repeat colon cancer screening with colonoscopy at 10-year interval.  Labs 08/2018: Hb 10.8, MCV 100.5; cmet normal except Cr 1.22   Review of systems: Pertinent positive and negative review of systems were noted in the above HPI section. All other review negative.   Past Medical History:  Diagnosis Date  . Anemia   . Arthritis   . Asthma   . Breast cancer of lower-outer quadrant of right female breast ([place]) 02/13/2015  . Cataract   . CHF, acute ([place]) 05/03/2012  . Depression   . Diabetes mellitus   . Eczema   . Fatty liver    Fatty infiltration of liver noted on 03/2012 CT scan  . Fibromyalgia   . GERD (gastroesophageal reflux disease)   . Glaucoma   . Heart disease   . History of kidney stones    w/ hx of hydronephrosis - followed by [facility]  . HIV nonspecific serology    2006: indeterminate HIV blood test, seen by ID, felt secondary to cross reacting antibodies with no further workup felt necessary at that time   . Hypertension   . Obesity   . Personal history of radiation therapy 2016   right  . Radiation 05/07/15-06/23/15   Right Breast    Past Surgical History:  Procedure Laterality Date  . BREAST  BIOPSY Left 02/10/2015   malignant   . BREAST LUMPECTOMY Right 03/21/2015  . CHOLECYSTECTOMY  2003  . CYSTOSCOPY W/ LITHOLAPAXY / EHL    . JOINT REPLACEMENT     bilateral knee replacement  . LEFT AND RIGHT HEART CATHETERIZATION WITH CORONARY ANGIOGRAM N/A 05/02/2012   Procedure: LEFT AND RIGHT HEART CATHETERIZATION WITH CORONARY ANGIOGRAM;  Surgeon: Burnell Blanks, MD;  Location: [facility] CATH LAB;  Service: Cardiovascular;  Laterality: N/A;  . RADIOACTIVE SEED GUIDED PARTIAL MASTECTOMY WITH AXILLARY SENTINEL LYMPH NODE BIOPSY Right 03/21/2015   Procedure: RIGHT  PARTIAL MASTECTOMY WITH RADIOACTIVE SEED LOCALIZATION RIGHT  AXILLARY SENTINEL LYMPH NODE BIOPSY;  Surgeon: Fanny Skates, MD;  Location: [facility];  Service: General;  Laterality: Right;  . REPLACEMENT TOTAL KNEE BILATERAL  2005 &2006  . VASCULAR SURGERY Right 03/15/2013   Ultrasound guided sclerotherapy    Current Outpatient Medications  Medication Sig Dispense Refill  . acetaminophen (TYLENOL 8 HOUR ARTHRITIS PAIN) 650 MG CR tablet Take 650 mg by mouth every 8 (eight) hours as needed for pain.    Marland Kitchen albuterol (PROVENTIL HFA;VENTOLIN HFA) 108 (90 Base) MCG/ACT inhaler Inhale 2 puffs into the lungs every 6 (six) hours as needed for wheezing. 1 Inhaler 3  . allopurinol (ZYLOPRIM) 100 MG tablet Take 1 tablet (100 mg total) by mouth daily. 90 tablet 1  . aspirin 81  MG tablet Take 81 mg by mouth daily.    Marland Kitchen atorvastatin (LIPITOR) 40 MG tablet TAKE 1 TABLET BY MOUTH ONCE DAILY 90 tablet 2  . benazepril (LOTENSIN) 20 MG tablet TAKE 1 TABLET BY MOUTH ONCE DAILY 90 tablet 3  . colchicine 0.6 MG tablet TAKE 1 TABLET BY MOUTH ONCE DAILY 90 tablet 1  . dorzolamide-timolol (COSOPT) 22.3-6.8 MG/ML ophthalmic solution Place 1 drop into both eyes 2 (two) times daily.  0  . exemestane (AROMASIN) 25 MG tablet TAKE 1 TABLET BY MOUTH ONCE DAILY AFTER BREAKFAST 90 tablet 3  . fluticasone (FLOVENT HFA) 110 MCG/ACT inhaler INHALE 2 PUFFS  INTO THE LUNGS 2 TIMES DAILY 1 Inhaler 11  . furosemide (LASIX) 20 MG tablet TAKE 1 TABLET BY MOUTH ONCE DAILY 90 tablet 2  . gabapentin (NEURONTIN) 100 MG capsule TAKE 4 TO 5 CAPSULES BY MOUTH 3 TIMES DAILY 450 capsule 2  . latanoprost (XALATAN) 0.005 % ophthalmic solution Place 1 drop into both eyes 4 (four) times daily.   0  . metFORMIN (GLUCOPHAGE) 1000 MG tablet TAKE 1 TABLET BY MOUTH TWICE A DAY WITH FOOD 180 tablet 3  . metoprolol succinate (TOPROL-XL) 25 MG 24 hr tablet Take 25 mg by mouth daily.  90 tablet 3  . spironolactone (ALDACTONE) 25 MG tablet TAKE 1 TABLET BY MOUTH ONCE DAILY 90 tablet 1  . sulfacetamide (BLEPH-10) 10 % ophthalmic solution INT 1 GTT IN OD QID  1  . traMADol (ULTRAM) 50 MG tablet Take 1 tablet (50 mg total) by mouth every 6 (six) hours as needed. 10 tablet 0   No current facility-administered medications for this visit.     Allergies as of 10/10/2018  . (No Known Allergies)    Family History  Problem Relation Age of Onset  . Diabetes Mother   . Stroke Mother   . Heart disease Father   . Anemia Father   . Cancer Sister 17       nose cancer   . Cancer Cousin 30       ovarian cancer   . Cancer Cousin 11       ovarian cancer     Social History   Socioeconomic History  . Marital status: Widowed    Spouse name: [name]  . Number of children: 3  . Years of education: 90  . Highest education level: Associate degree: occupational, Hotel manager, or vocational program  Occupational History  . Occupation: retired-CNA, Microbiologist: RETIRED  . Occupation: Psychologist, sport and exercise  Social Needs  . Financial resource strain: Not very hard  . Food insecurity:    Worry: Never true    Inability: Never true  . Transportation needs:    Medical: No    Non-medical: No  Tobacco Use  . Smoking status: Never Smoker  . Smokeless tobacco: Never Used  Substance and Sexual Activity  . Alcohol use: No    Alcohol/week: 0.0 standard drinks  . Drug use: No   . Sexual activity: Never    Birth control/protection: Post-menopausal  Lifestyle  . Physical activity:    Days per week: 2 days    Minutes per session: 20 min  . Stress: Not at all  Relationships  . Social connections:    Talks on phone: More than three times a week    Gets together: Three times a week    Attends religious service: More than 4 times per year    Active member of club or organization: Yes  Attends meetings of clubs or organizations: More than 4 times per year    Relationship status: Widowed  . Intimate partner violence:    Fear of current or ex partner: No    Emotionally abused: No    Physically abused: No    Forced sexual activity: No  Other Topics Concern  . Not on file  Social History Narrative   Emergency Contact: son, Tranice Laduke [phone]   Patient lives is single story home with daughter, [name] and grandddaughter, [name]. Ramp to front door, 3 steps in back. Home has smoke detectors, no throw rugs. No grab bars in bathroom. No pets.   Diet: Pt has a varied diet.  Reports eating 2 meals a day.    Exercise: walks daily 15-20 mins; not as much due to possible irritable bowel; does exercise at church once weekly. Serves on [employer] at [employer] and as a Chief Executive Officer at [employer].   Seatbelts: Pt reports wearing seatbelt when in vehicles. Does not drive.   Hobbies: word searches, church, time with family and friends, cooking, walking when she can; going out shopping.   Drinks occasional soda; drinks a lot of water and some kool-aid; does drink tea              Physical Exam: BP 130/70   Pulse 82   Ht 5\' 2"  (1.575 m)   Wt 219 lb (99.3 kg)   BMI 40.06 kg/m  Constitutional: generally well-appearing Psychiatric: alert and oriented x3 Eyes: extraocular movements intact Mouth: oral pharynx moist, no lesions Neck: supple no lymphadenopathy Cardiovascular: heart regular rate and rhythm Lungs: clear to auscultation bilaterally Abdomen: soft,  nontender, nondistended, no obvious ascites, no peritoneal signs, normal bowel sounds Extremities: no lower extremity edema bilaterally Skin: no lesions on visible extremities   Assessment and plan: 67 y.o. female with she has chronic loose stools that have been worse lately.  She is on metformin 1 g twice daily and loose stools are a very common side effect of this medication so much so that I asked her to stop the medicine for the next 1 to 2 weeks and call back to report on her response after that trial.  If she notices significant improvement then she would not need to undergo any further testing and I would simply recommend a single Imodium every morning and a second 1 later in the day if she needs it.  If she does not notice significant improvement after trial of stopping the metformin and she will probably require further testing.    Please see the "Patient Instructions" section for addition details about the plan.   Owens Loffler, MD [facility] 10/10/2018, 11:17 AM  Cc: Lind Covert, *

## 2018-10-10 NOTE — Patient Instructions (Addendum)
Stop taking metformin for two weeks and then call Dr. Ardis Hughs' office to report on your response.  Thank you for entrusting me with your care and choosing [facility].  Dr Ardis Hughs

## 2018-10-11 DIAGNOSIS — N200 Calculus of kidney: Secondary | ICD-10-CM | POA: Diagnosis not present

## 2018-10-31 ENCOUNTER — Other Ambulatory Visit: Payer: Self-pay | Admitting: *Deleted

## 2018-10-31 NOTE — Patient Outreach (Signed)
[facility]) Care Management  [id number]  [name] [birth year] [phone]   Covering for assigned Health Coach, Joaquim Lai [place]:  RNCM called to follow up. Client states, "I am doing good. She reports her last A1C was 7.4. She denies any questions or issues at this time.  Plan: Health Coach will continue to follow client. [facility] CM Care Plan Problem One     Most Recent Value  Care Plan Problem One  Knowledge Deficit in self management of Diabetes  Role Documenting the Problem One  [place] for Problem One  Active  [facility] Long Term Goal   Patient will see a decrease in A1C from 7.6 with next blood draw  [facility] Long Term Goal Start Date  07/17/18  [facility] Long Term Goal Met Date  10/31/18  [facility] CM Short Term Goal #1   Patient will be able to make healthier choices with snacks within the next 30 days  [facility] CM Short Term Goal #1 Start Date  07/17/18  Interventions for Short Term Goal #1  discussed importance of healthy food choices.  [facility] CM Short Term Goal #2   Patient will have a better understanding of low salt diet within the next 30 days  [facility] CM Short Term Goal #2 Start Date  07/17/18  [facility] CM Short Term Goal #2 Met Date  10/31/18     Covering RNCM for Joaquim Lai [place]: Thea Silversmith, RN, MSN, [place] Coordinator Cell: [phone]

## 2018-11-07 ENCOUNTER — Other Ambulatory Visit: Payer: Self-pay | Admitting: Family Medicine

## 2018-11-14 ENCOUNTER — Other Ambulatory Visit: Payer: Self-pay

## 2018-11-14 ENCOUNTER — Ambulatory Visit (INDEPENDENT_AMBULATORY_CARE_PROVIDER_SITE_OTHER): Payer: Medicare Other | Admitting: Family Medicine

## 2018-11-14 ENCOUNTER — Encounter: Payer: Self-pay | Admitting: Family Medicine

## 2018-11-14 VITALS — BP 146/80 | HR 79 | Temp 97.8°F | Ht 62.0 in | Wt 219.2 lb

## 2018-11-14 DIAGNOSIS — E1169 Type 2 diabetes mellitus with other specified complication: Secondary | ICD-10-CM

## 2018-11-14 DIAGNOSIS — R2681 Unsteadiness on feet: Secondary | ICD-10-CM | POA: Diagnosis not present

## 2018-11-14 DIAGNOSIS — I10 Essential (primary) hypertension: Secondary | ICD-10-CM

## 2018-11-14 DIAGNOSIS — E782 Mixed hyperlipidemia: Secondary | ICD-10-CM | POA: Diagnosis not present

## 2018-11-14 DIAGNOSIS — M21969 Unspecified acquired deformity of unspecified lower leg: Secondary | ICD-10-CM

## 2018-11-14 LAB — POCT GLYCOSYLATED HEMOGLOBIN (HGB A1C): HbA1c, POC (controlled diabetic range): 8.4 % — AB (ref 0.0–7.0)

## 2018-11-14 MED ORDER — GABAPENTIN 100 MG PO CAPS: 300.0000 mg | ORAL_CAPSULE | Freq: Three times a day (TID) | ORAL | 2 refills | Status: DC

## 2018-11-14 MED ORDER — ALLOPURINOL 100 MG PO TABS: 100.0000 mg | ORAL_TABLET | Freq: Every day | ORAL | 1 refills | Status: DC

## 2018-11-14 MED ORDER — EMPAGLIFLOZIN 10 MG PO TABS: 10.0000 mg | ORAL_TABLET | Freq: Every day | ORAL | 0 refills | Status: DC

## 2018-11-14 MED ORDER — SPIRONOLACTONE 25 MG PO TABS: 25.0000 mg | ORAL_TABLET | Freq: Every day | ORAL | 1 refills | Status: DC

## 2018-11-14 MED ORDER — COLCHICINE 0.6 MG PO TABS: 0.6000 mg | ORAL_TABLET | Freq: Every day | ORAL | 1 refills | Status: DC

## 2018-11-14 NOTE — Assessment & Plan Note (Signed)
Not at goal likely due to stopping metformin for diarrhea.  Will start Jardiance - her last K and creatinine were acceptable.

## 2018-11-14 NOTE — Progress Notes (Signed)
Subjective  [name] is a 67 y.o. female is presenting with the following  HYPERTENSION Disease Monitoring: Blood pressure range- at home systolics usually in the 888-280K Chest pain, palpitations- no      Dyspnea- no Medications: Compliance- brings all her meds Lightheadedness,Syncope- no   Edema- no  DIABETES Disease Monitoring: Blood Sugar ranges-not checking Polyuria/phagia/dipsia- no      Visual problems- no Medications: Compliance- stopped metformin by GI for diarrhea. Hypoglycemic symptoms- no  HYPERLIPIDEMIA Disease Monitoring: See symptoms for Hypertension Medications: Compliance- lipitor daily Right upper quadrant pain- no  Muscle aches- no  FALLS Has had several fall over last months one with injury.  Uses walker but has trouble getting up.  Has seen Physical Therapy before but is willing to again.  Has also seen neurology.  No focal weakness   Monitoring Labs and Parameters Last A1C:  Lab Results  Component Value Date   HGBA1C 8.4 (A) 11/14/2018    Last Lipid:     Component Value Date/Time   CHOL 229 (H) 01/07/2016 1635   HDL 39 (L) 01/07/2016 1635   LDLDIRECT 36 03/31/2016 1027    Last Bmet  Potassium  Date Value Ref Range Status  09/23/2018 3.7 3.5 - 5.1 mmol/L Final  08/03/2017 4.9 3.5 - 5.1 mEq/L Final   Sodium  Date Value Ref Range Status  09/23/2018 139 135 - 145 mmol/L Final  05/16/2018 141 134 - 144 mmol/L Final  08/03/2017 141 136 - 145 mEq/L Final   Creatinine  Date Value Ref Range Status  09/07/2018 1.36 (H) 0.44 - 1.00 mg/dL Final  08/03/2017 1.5 (H) 0.6 - 1.1 mg/dL Final   Creatinine, Ser  Date Value Ref Range Status  09/23/2018 1.22 (H) 0.44 - 1.00 mg/dL Final      Last BPs:  BP Readings from Last 3 Encounters:  11/14/18 (!) 146/80  10/10/18 130/70  09/07/18 (!) 141/79      Chief Complaint noted Review of Symptoms - see HPI PMH - Smoking status noted.    Objective Vital Signs reviewed BP (!) 146/80   Pulse 79    Temp 97.8 F (36.6 C) (Oral)   Ht 5\' 2"  (1.575 m)   Wt 219 lb 3.2 oz (99.4 kg)   SpO2 99%   BMI 40.09 kg/m  Heart - Regular rate and rhythm.  No murmurs, gallops or rubs.    Lungs:  Normal respiratory effort, chest expands symmetrically. Lungs are clear to auscultation, no crackles or wheezes. Has trouble rising from seated position to use walker.  When upright with walker moves fairly well  Assessments/Plans  See after visit summary for details of patient instuctions  Essential hypertension Not at goal but perhaps Jardiance may decrease her blood pressure some due to diuresis.  Will monitor.  Gait instability Not well controlled.  Has been seen by neurology in the past.  Also had Physical Therapy.  Will decrease her gabapentin and refer again to Physical Therapy   Hyperlipidemia dLDL in 2017 was 36.  May recheck next visit   Type 2 diabetes mellitus with diabetic foot deformity Not at goal likely due to stopping metformin for diarrhea.  Will start Jardiance - her last K and creatinine were acceptable.

## 2018-11-14 NOTE — Assessment & Plan Note (Signed)
Not at goal but perhaps Jardiance may decrease her blood pressure some due to diuresis.  Will monitor.

## 2018-11-14 NOTE — Assessment & Plan Note (Signed)
dLDL in 2017 was 36.  May recheck next visit

## 2018-11-14 NOTE — Patient Instructions (Signed)
Good to see you today!  Thanks for coming in.  For the Falls - cut back on the Gabapentin to 3 caps three times a day.  Let me know if it makes a big difference to your pain  For the diabetes - stop the metformin as you have - take Jardiance 10 mg once a day in the AM - make sure you stay hydrated  For the blood pressure - hopefully Jardiance may help with this - call me with your blood pressure readings in one month   Come back for a blood test one week after you start the Jardiance  Come back in 3 months for a A1c check

## 2018-11-14 NOTE — Assessment & Plan Note (Signed)
Not well controlled.  Has been seen by neurology in the past.  Also had Physical Therapy.  Will decrease her gabapentin and refer again to Physical Therapy

## 2018-11-23 ENCOUNTER — Other Ambulatory Visit: Payer: Medicare Other

## 2018-11-24 ENCOUNTER — Other Ambulatory Visit: Payer: Medicare Other

## 2018-11-24 DIAGNOSIS — I10 Essential (primary) hypertension: Secondary | ICD-10-CM | POA: Diagnosis not present

## 2018-11-25 LAB — BASIC METABOLIC PANEL
BUN/Creatinine Ratio: 25 (ref 12–28)
BUN: 40 mg/dL — ABNORMAL HIGH (ref 8–27)
CO2: 20 mmol/L (ref 20–29)
Calcium: 9.9 mg/dL (ref 8.7–10.3)
Chloride: 98 mmol/L (ref 96–106)
Creatinine, Ser: 1.6 mg/dL — ABNORMAL HIGH (ref 0.57–1.00)
GFR calc Af Amer: 38 mL/min/{1.73_m2} — ABNORMAL LOW (ref >59)
GFR calc non Af Amer: 33 mL/min/{1.73_m2} — ABNORMAL LOW (ref >59)
Glucose: 334 mg/dL — ABNORMAL HIGH (ref 65–99)
Potassium: 4.6 mmol/L (ref 3.5–5.2)
Sodium: 135 mmol/L (ref 134–144)

## 2018-11-28 ENCOUNTER — Ambulatory Visit: Payer: Medicare Other | Attending: Family Medicine | Admitting: Physical Therapy

## 2018-11-28 DIAGNOSIS — R2689 Other abnormalities of gait and mobility: Secondary | ICD-10-CM | POA: Insufficient documentation

## 2018-11-28 DIAGNOSIS — R2681 Unsteadiness on feet: Secondary | ICD-10-CM | POA: Insufficient documentation

## 2018-11-28 DIAGNOSIS — M6281 Muscle weakness (generalized): Secondary | ICD-10-CM | POA: Insufficient documentation

## 2018-11-28 NOTE — Patient Instructions (Signed)
Access Code: [email or web address]  URL: [email or web address]  Date: 11/28/2018  Prepared by: [name]   Exercises  Seated Ankle Pumps on Table - 10 reps - 2 sets - 3 sec hold - 1-2x daily - 7x weekly  Seated March - 10 reps - 2 sets - 1-2x daily - 7x weekly  Seated Long Arc Quad - 10 reps - 2 sets - 1-2x daily - 7x weekly  Seated Gluteal Sets - 10 reps - 2 sets - 3 sec hold - 1-2x daily - 7x weekly  Sit to Stand Transfers:  1. Scoot out to the edge of the chair 2. Place your feet flat on the floor, shoulder width apart.  Make sure your feet are tucked just under your knees. 3. Lean forward (nose over toes) with momentum, and stand up tall with your best posture.  If you need to use your arms, use them as a quick boost up to stand. 4. If you are in a low or soft chair, you can lean back and then forward up to stand, in order to get more momentum. 5. Once you are standing, make sure you are looking ahead and standing tall.  To sit down:  1. Back up until you feel the chair behind your legs. 2. Bend at you hips, reaching  Back for you chair, if needed, then slowly squat to sit down on your chair.

## 2018-11-28 NOTE — Therapy (Signed)
[facility] [address] [place], [place], [address] Phone: [phone]   Fax:  [phone]  Physical Therapy Evaluation  Patient Details  Name: [name] MRN: [id number] Date of Birth: Aug 31, 1951 Referring Provider (PT): Talbert Cage, MD   Encounter Date: 11/28/2018  PT End of Session - 11/28/18 1450    Visit Number  1    Number of Visits  17    Date for PT Re-Evaluation  02/26/19    Authorization Type  [employer] Medicare & Medicaid    Authorization Time Period  Auth period 11/28/18-02/26/19    PT Start Time  1013    PT Stop Time  1059    PT Time Calculation (min)  46 min    Activity Tolerance  Patient tolerated treatment well    Behavior During Therapy  [facility] for tasks assessed/performed       Past Medical History:  Diagnosis Date  . Anemia   . Arthritis   . Asthma   . Breast cancer of lower-outer quadrant of right female breast ([place]) 02/13/2015  . Cataract   . CHF, acute ([place]) 05/03/2012  . Depression   . Diabetes mellitus   . Eczema   . Fatty liver    Fatty infiltration of liver noted on 03/2012 CT scan  . Fibromyalgia   . GERD (gastroesophageal reflux disease)   . Glaucoma   . Heart disease   . History of kidney stones    w/ hx of hydronephrosis - followed by [facility]  . HIV nonspecific serology    2006: indeterminate HIV blood test, seen by ID, felt secondary to cross reacting antibodies with no further workup felt necessary at that time   . Hypertension   . Obesity   . Personal history of radiation therapy 2016   right  . Radiation 05/07/15-06/23/15   Right Breast    Past Surgical History:  Procedure Laterality Date  . BREAST BIOPSY Left 02/10/2015   malignant   . BREAST LUMPECTOMY Right 03/21/2015  . CHOLECYSTECTOMY  2003  . CYSTOSCOPY W/ LITHOLAPAXY / EHL    . JOINT REPLACEMENT     bilateral knee replacement  . LEFT AND RIGHT HEART CATHETERIZATION WITH CORONARY ANGIOGRAM N/A  05/02/2012   Procedure: LEFT AND RIGHT HEART CATHETERIZATION WITH CORONARY ANGIOGRAM;  Surgeon: Burnell Blanks, MD;  Location: [facility] CATH LAB;  Service: Cardiovascular;  Laterality: N/A;  . RADIOACTIVE SEED GUIDED PARTIAL MASTECTOMY WITH AXILLARY SENTINEL LYMPH NODE BIOPSY Right 03/21/2015   Procedure: RIGHT  PARTIAL MASTECTOMY WITH RADIOACTIVE SEED LOCALIZATION RIGHT  AXILLARY SENTINEL LYMPH NODE BIOPSY;  Surgeon: Fanny Skates, MD;  Location: [place];  Service: General;  Laterality: Right;  . REPLACEMENT TOTAL KNEE BILATERAL  2005 &2006  . VASCULAR SURGERY Right 03/15/2013   Ultrasound guided sclerotherapy    There were no vitals filed for this visit.   Subjective Assessment - 11/28/18 1015    Subjective  Fell on October 27 and hit head, got stitches in front of her head.  Balance just feels wobbly.  I couldn't catch myself from falling.  Uses rollator walker outside of the home; I usually don't use cane or walker in the house.    Pertinent History  Breast cancer 2015:  Neuropathy; DM type 2, arthritis      Patient Stated Goals  Pt's goal for therapy is to get built back up to do my normal things.    Currently in Pain?  Yes  Pain Score  6     Pain Location  Wrist   knees, feet   Pain Orientation  Left    Pain Descriptors / Indicators  Aching;Throbbing    Pain Type  Chronic pain    Pain Onset  More than a month ago    Pain Frequency  Constant    Aggravating Factors   aggravated by cold    Pain Relieving Factors  medication, hot showers, creme    Effect of Pain on Daily Activities  PT will monitor pain, but will not address as a goal, as pain is chronic in nature         [facility] PT Assessment - 11/28/18 1020      Assessment   Medical Diagnosis  Gait instability    Referring Provider (PT)  Talbert Cage, MD    Onset Date/Surgical Date  09/24/18   fall     Precautions   Precautions  Fall      Balance Screen   Has the patient fallen in the past 6  months  Yes    How many times?  1    Has the patient had a decrease in activity level because of a fear of falling?   Yes    Is the patient reluctant to leave their home because of a fear of falling?   No      Home Environment   Living Environment  Private residence    Living Arrangements  Children   daughter, granddaughter   Available Help at Discharge  Family    Type of [place] to enter    Entrance Stairs-Number of Steps  3    Entrance Stairs-Rails  Right   son to rebuild to make more stable   Home Layout  One level    [place] - 4 wheels;Cane - single point    Additional Comments  Family assists with cooking, cleaning      Prior Function   Level of Independence  Independent with community mobility with device    Leisure  Enjoys going to church, going shopping      Posture/Postural Control   Posture/Postural Control  Postural limitations    Postural Limitations  Rounded Shoulders;Forward head      Tone   Assessment Location  --      Strength   Overall Strength  Deficits    Overall Strength Comments  Grossly tested 4+/5 RLE, 4/5 LLE      Transfers   Transfers  Sit to Stand;Stand to Sit    Sit to Stand  4: Min guard;With upper extremity assist;Multiple attempts   Uses backs of legs to push against chair   Sit to Stand Details (indicate cue type and reason)  Multiple attempts to stand, one episode of sitting back down in attempts to stand    Stand to Sit  5: Supervision;With upper extremity assist;To chair/3-in-1      Ambulation/Gait   Ambulation/Gait  Yes    Ambulation/Gait Assistance  5: Supervision    Ambulation Distance (Feet)  100 Feet   x 2   Assistive device  Rollator    Gait Pattern  Step-through pattern;Decreased stride length;Trunk flexed;Poor foot clearance - left    Ambulation Surface  Level;Indoor    Gait velocity  17.19 sec = 1.91 ft/sec      Standardized Balance Assessment   Standardized Balance Assessment   Berg Balance Test;Timed Up and Go Test  Berg Balance Test   Sit to Stand  Needs minimal aid to stand or to stabilize    Standing Unsupported  Able to stand 30 seconds unsupported    Sitting with Back Unsupported but Feet Supported on Floor or Stool  Able to sit safely and securely 2 minutes    Stand to Sit  Controls descent by using hands    Transfers  Able to transfer with verbal cueing and /or supervision    Standing Unsupported with Eyes Closed  Unable to keep eyes closed 3 seconds but stays steady    Standing Ubsupported with Feet Together  Needs help to attain position and unable to hold for 15 seconds    From Standing, Reach Forward with Outstretched Arm  Loses balance while trying/requires external support    From Standing Position, Pick up Object from Floor  Unable to try/needs assist to keep balance    From Standing Position, Turn to Look Behind Over each Shoulder  Needs assist to keep from losing balance and falling    Turn 360 Degrees  Needs assistance while turning    Standing Unsupported, Alternately Place Feet on Step/Stool  Needs assistance to keep from falling or unable to try    Standing Unsupported, One Foot in [employer] balance while stepping or standing    Standing on One Leg  Unable to try or needs assist to prevent fall    Total Score  13    Berg comment:  Scores < 45/56 indicate increased fall risk; previous Berg score was 48/56 at d/c 04/2018.        Timed Up and Go Test   TUG  Normal TUG    Normal TUG (seconds)  25.28    TUG Comments  Scores >25.28 sec indicate increased fall risk.                Objective measurements completed on examination: See above findings.          Therapeutic Ex: Seated ankle pumps x 5 reps, seated LAQ, seated marching (demo 2-3 reps each), then seated glut sets x 3 reps-added to HEP Provided cues and practiced sit<>stand, with cues for foot placement and increased forward lean for improved control into standing  position.  Educated pt to lock brakes so that rollator is locked upon standing.     PT Education - 11/28/18 1311    Education provided  Yes    Education Details  Initiated HEP, sit to stand transfer technique    Person(s) Educated  Patient    Methods  Explanation;Demonstration;Handout    Comprehension  Verbalized understanding;Returned demonstration;Verbal cues required       PT Short Term Goals - 11/28/18 1459      PT SHORT TERM GOAL #1   Title  Pt will be independent with HEP for improved balance, gait, strength.  TARGET 12/29/18    Time  5    Period  Weeks    Status  New    Target Date  12/29/18      PT SHORT TERM GOAL #2   Title  Pt will perform at least 4 of 5 reps of sit<>stand with UE support, no posterior LOB, for improved safety and efficiency with transfers.    Time  5    Period  Weeks    Status  New    Target Date  12/29/18      PT SHORT TERM GOAL #3   Title  Pt will improve [employer]  score to at least 23/56 for decreased fall risk.    Time  5    Period  Weeks    Status  New    Target Date  12/29/18      PT SHORT TERM GOAL #4   Title  Pt will improve TUG score to less than or equal to 20 seconds for decreased fall risk.    Time  5    Period  Weeks    Status  New    Target Date  12/29/18        PT Long Term Goals - 11/28/18 1501      PT LONG TERM GOAL #1   Title  Pt will verbalize understanding of fall prevention in home environment.    Time  9    Period  Weeks    Status  New    Target Date  01/27/19      PT LONG TERM GOAL #2   Title  Pt will improve gait velocity to at least 2.3 ft/sec for improved gait efficiency and safety.    Time  9    Period  Weeks    Status  New    Target Date  01/27/19      PT LONG TERM GOAL #3   Title  Pt will improve Berg Balance score to at least 33/56 for decreased fall risk.    Time  9    Period  Weeks    Status  New    Target Date  01/27/19      PT LONG TERM GOAL #4   Title  Pt will improve TUG score to  less than or equal to 15 seconds for decreased fall risk    Time  9    Period  Weeks    Status  New    Target Date  01/27/19             Plan - 11/28/18 1451    Clinical Impression Statement  Pt is a 67 year old female who presents to [place] following fall in 09/24/18, with increased fear of falling, decreased balance measures.  She was seen earlier this year in this clinic for PT; however, gait velocity, Berg and TUG scores have declined since that d/c in June 2019.  Pt is at high risk for falls per Merrilee Jansky, TUG scores; she presents with decreased balance, decreased lower extremity strength, decreased gait independence, decreased stability and independence with transfers.  Pt has PMH including glaucoma, neuropathy, DJD, DM.  Pt would benefit from skilled PT to address the above stated deficits to decrease fall risk, improve functional mobility and independence.    History and Personal Factors relevant to plan of care:  PMH >3 co-morbidities including DM, neuropathy, glaucoma, DJD; 1 fall in past 6 months    Clinical Presentation  Evolving    Clinical Presentation due to:  neuropathy due to DM, diabetic foot deformity, fall risk per Merrilee Jansky, TUG scores, limited community ambulator per gait velocity score    Clinical Decision Making  Moderate    Rehab Potential  Good    PT Frequency  2x / week    PT Duration  8 weeks   plus eval visit = 9 weeks total POC   PT Treatment/Interventions  ADLs/Self Care Home Management;Therapeutic exercise;Patient/family education;Therapeutic activities;Functional mobility training;Gait training;DME Instruction;Balance training;Neuromuscular re-education    PT Next Visit Plan  Review seated exercises given as HEP this visit; review and continue to practice transfer training, balance exercises, gait  training with rollator    Consulted and Agree with Plan of Care  Patient       Patient will benefit from skilled therapeutic intervention in order to improve the following  deficits and impairments:  Abnormal gait, Decreased balance, Decreased strength, Decreased mobility, Difficulty walking, Postural dysfunction  Visit Diagnosis: Other abnormalities of gait and mobility  Unsteadiness on feet  Muscle weakness (generalized)     Problem List Patient Active Problem List   Diagnosis Date Noted  . Pain in joint, ankle and foot 05/16/2018  . Gout 05/16/2018  . Gait instability 01/03/2018  . Frequent bowel movements 11/02/2017  . Tremor of both hands   . Stuttering   . Essential hypertension   . Hyperlipidemia 08/13/2015  . Breast cancer of lower-outer quadrant of right female breast ([place]) 02/13/2015  . Vitamin D deficiency 02/06/2015  . Type 2 diabetes mellitus with diabetic foot deformity ([place]) 01/01/2014  . Chronic combined systolic and diastolic heart failure ([place]) 02/26/2013  . OBESITY 02/05/2009  . Asthma, intermittent 02/08/2008  . NEPHROLITHIASIS 01/26/2007  . DJD (degenerative joint disease), multiple sites 01/26/2007    Debroah Shuttleworth W. 11/28/2018, 3:05 PM  Frazier Butt., PT  [facility] [address] [place], [place], [address] Phone: [phone]   Fax:  [phone]  Name: COLLEENA KURTENBACH MRN: [id number] Date of Birth: 30-Oct-1951

## 2018-12-04 ENCOUNTER — Ambulatory Visit: Payer: Medicare Other | Admitting: Physical Therapy

## 2018-12-05 ENCOUNTER — Ambulatory Visit: Payer: Medicare Other | Attending: Family Medicine | Admitting: Physical Therapy

## 2018-12-05 DIAGNOSIS — R2689 Other abnormalities of gait and mobility: Secondary | ICD-10-CM | POA: Insufficient documentation

## 2018-12-05 DIAGNOSIS — R2681 Unsteadiness on feet: Secondary | ICD-10-CM | POA: Diagnosis not present

## 2018-12-05 DIAGNOSIS — M6281 Muscle weakness (generalized): Secondary | ICD-10-CM | POA: Insufficient documentation

## 2018-12-05 NOTE — Therapy (Signed)
[facility] [address] [place], [place], [address] Phone: [phone]   Fax:  [phone]  Physical Therapy Treatment  Patient Details  Name: [name] MRN: [id number] Date of Birth: 1951/05/23 Referring Provider (PT): Talbert Cage, MD   Encounter Date: 12/05/2018  PT End of Session - 12/05/18 1131    Visit Number  2    Number of Visits  17    Date for PT Re-Evaluation  02/26/19    Authorization Type  [employer] Medicare & Medicaid    Authorization Time Period  Auth period 11/28/18-02/26/19    PT Start Time  1019    PT Stop Time  1059    PT Time Calculation (min)  40 min    Activity Tolerance  Patient tolerated treatment well    Behavior During Therapy  [facility] for tasks assessed/performed       Past Medical History:  Diagnosis Date  . Anemia   . Arthritis   . Asthma   . Breast cancer of lower-outer quadrant of right female breast ([place]) 02/13/2015  . Cataract   . CHF, acute ([place]) 05/03/2012  . Depression   . Diabetes mellitus   . Eczema   . Fatty liver    Fatty infiltration of liver noted on 03/2012 CT scan  . Fibromyalgia   . GERD (gastroesophageal reflux disease)   . Glaucoma   . Heart disease   . History of kidney stones    w/ hx of hydronephrosis - followed by [facility]  . HIV nonspecific serology    2006: indeterminate HIV blood test, seen by ID, felt secondary to cross reacting antibodies with no further workup felt necessary at that time   . Hypertension   . Obesity   . Personal history of radiation therapy 2016   right  . Radiation 05/07/15-06/23/15   Right Breast    Past Surgical History:  Procedure Laterality Date  . BREAST BIOPSY Left 02/10/2015   malignant   . BREAST LUMPECTOMY Right 03/21/2015  . CHOLECYSTECTOMY  2003  . CYSTOSCOPY W/ LITHOLAPAXY / EHL    . JOINT REPLACEMENT     bilateral knee replacement  . LEFT AND RIGHT HEART CATHETERIZATION WITH CORONARY ANGIOGRAM N/A  05/02/2012   Procedure: LEFT AND RIGHT HEART CATHETERIZATION WITH CORONARY ANGIOGRAM;  Surgeon: Burnell Blanks, MD;  Location: [facility] CATH LAB;  Service: Cardiovascular;  Laterality: N/A;  . RADIOACTIVE SEED GUIDED PARTIAL MASTECTOMY WITH AXILLARY SENTINEL LYMPH NODE BIOPSY Right 03/21/2015   Procedure: RIGHT  PARTIAL MASTECTOMY WITH RADIOACTIVE SEED LOCALIZATION RIGHT  AXILLARY SENTINEL LYMPH NODE BIOPSY;  Surgeon: Fanny Skates, MD;  Location: [facility];  Service: General;  Laterality: Right;  . REPLACEMENT TOTAL KNEE BILATERAL  2005 &2006  . VASCULAR SURGERY Right 03/15/2013   Ultrasound guided sclerotherapy    There were no vitals filed for this visit.  Subjective Assessment - 12/05/18 1110    Subjective  Pt relays she has tried her exercises, no questions about them.     Currently in Pain?  Yes    Pain Score  5     Pain Location  Wrist   and knees   Pain Orientation  Left    Pain Descriptors / Indicators  Aching    Pain Type  Chronic pain                       [facility] Adult PT Treatment/Exercise - 12/05/18 0001  Ambulation/Gait   Ambulation/Gait  Yes    Ambulation/Gait Assistance  5: Supervision    Ambulation Distance (Feet)  100 Feet   X2   Assistive device  Rollator    Gait Pattern  Step-through pattern;Decreased stride length;Trunk flexed;Poor foot clearance - left    Ambulation Surface  Level;Indoor      High Level Balance   High Level Balance Comments  standing feet together, standing feet apart eyes closed, feet apart on foam pad all 3 X 30 seconds, then sidestepping in parallel bars up/down X 5 with fingertip support      Exercises   Exercises  Knee/Hip      Knee/Hip Exercises: Aerobic   Nustep  5 min UE/LE L1      Knee/Hip Exercises: Seated   Long Arc Quad  Both;20 reps    Other Seated Knee/Hip Exercises  glute sets 5 sec hold X 15    Other Seated Knee/Hip Exercises  heel/toe raises X 20    Marching  Both;20 reps    Sit to  Sand  2 sets;5 reps             PT Education - 12/05/18 1128    Education provided  Yes    Education Details  HEP review    Person(s) Educated  Patient    Methods  Explanation;Demonstration    Comprehension  Verbalized understanding;Returned demonstration       PT Short Term Goals - 11/28/18 1459      PT SHORT TERM GOAL #1   Title  Pt will be independent with HEP for improved balance, gait, strength.  TARGET 12/29/18    Time  5    Period  Weeks    Status  New    Target Date  12/29/18      PT SHORT TERM GOAL #2   Title  Pt will perform at least 4 of 5 reps of sit<>stand with UE support, no posterior LOB, for improved safety and efficiency with transfers.    Time  5    Period  Weeks    Status  New    Target Date  12/29/18      PT SHORT TERM GOAL #3   Title  Pt will improve Berg Balance score to at least 23/56 for decreased fall risk.    Time  5    Period  Weeks    Status  New    Target Date  12/29/18      PT SHORT TERM GOAL #4   Title  Pt will improve TUG score to less than or equal to 20 seconds for decreased fall risk.    Time  5    Period  Weeks    Status  New    Target Date  12/29/18        PT Long Term Goals - 11/28/18 1501      PT LONG TERM GOAL #1   Title  Pt will verbalize understanding of fall prevention in home environment.    Time  9    Period  Weeks    Status  New    Target Date  01/27/19      PT LONG TERM GOAL #2   Title  Pt will improve gait velocity to at least 2.3 ft/sec for improved gait efficiency and safety.    Time  9    Period  Weeks    Status  New    Target Date  01/27/19  PT LONG TERM GOAL #3   Title  Pt will improve Berg Balance score to at least 33/56 for decreased fall risk.    Time  9    Period  Weeks    Status  New    Target Date  01/27/19      PT LONG TERM GOAL #4   Title  Pt will improve TUG score to less than or equal to 15 seconds for decreased fall risk    Time  9    Period  Weeks    Status  New     Target Date  01/27/19            Plan - 12/05/18 1132    Clinical Impression Statement  Session focused on HEP review and balance training today. She showed good return demonstration and understanding of HEP. She was inconsistent with balance training as sometimes she is stable and other times very unstable with tendency to lose balance posterioly, requiring assistance to maintain balance. PT will continue to work on this as able.     Rehab Potential  Good    PT Frequency  2x / week    PT Duration  8 weeks    PT Treatment/Interventions  ADLs/Self Care Home Management;Therapeutic exercise;Patient/family education;Therapeutic activities;Functional mobility training;Gait training;DME Instruction;Balance training;Neuromuscular re-education    PT Next Visit Plan   review and continue to practice transfer training, balance exercises, gait training with rollator    Consulted and Agree with Plan of Care  Patient       Patient will benefit from skilled therapeutic intervention in order to improve the following deficits and impairments:  Abnormal gait, Decreased balance, Decreased strength, Decreased mobility, Difficulty walking, Postural dysfunction  Visit Diagnosis: Other abnormalities of gait and mobility  Unsteadiness on feet  Muscle weakness (generalized)  Gait instability     Problem List Patient Active Problem List   Diagnosis Date Noted  . Pain in joint, ankle and foot 05/16/2018  . Gout 05/16/2018  . Gait instability 01/03/2018  . Frequent bowel movements 11/02/2017  . Tremor of both hands   . Stuttering   . Essential hypertension   . Hyperlipidemia 08/13/2015  . Breast cancer of lower-outer quadrant of right female breast ([place]) 02/13/2015  . Vitamin D deficiency 02/06/2015  . Type 2 diabetes mellitus with diabetic foot deformity ([place]) 01/01/2014  . Chronic combined systolic and diastolic heart failure ([place]) 02/26/2013  . OBESITY 02/05/2009  . Asthma, intermittent  02/08/2008  . NEPHROLITHIASIS 01/26/2007  . DJD (degenerative joint disease), multiple sites 01/26/2007    Silvestre Mesi 12/05/2018, 12:04 PM  [facility] [address] [place], [place], [address] Phone: [phone]   Fax:  [phone]  Name: [name] MRN: [id number] Date of Birth: 26-Sep-1951

## 2018-12-07 ENCOUNTER — Encounter: Payer: Self-pay | Admitting: Physical Therapy

## 2018-12-07 ENCOUNTER — Other Ambulatory Visit: Payer: Self-pay | Admitting: Family Medicine

## 2018-12-07 ENCOUNTER — Ambulatory Visit: Payer: Medicare Other | Admitting: Physical Therapy

## 2018-12-07 DIAGNOSIS — R2689 Other abnormalities of gait and mobility: Secondary | ICD-10-CM | POA: Diagnosis not present

## 2018-12-07 DIAGNOSIS — M6281 Muscle weakness (generalized): Secondary | ICD-10-CM | POA: Diagnosis not present

## 2018-12-07 DIAGNOSIS — R2681 Unsteadiness on feet: Secondary | ICD-10-CM | POA: Diagnosis not present

## 2018-12-07 NOTE — Patient Instructions (Signed)
WALKING  Walking is a great form of exercise to increase your strength, endurance and overall fitness.  A walking program can help you start slowly and gradually build endurance as you go.  Everyone's ability is different, so each person's starting point will be different.  You do not have to follow them exactly.  The are just samples. You should simply find out what's right for you and stick to that program.   In the beginning, you'll start off walking 2-3 times a day for short distances.  As you get stronger, you'll be walking further at just 1-2 times per day.   A. You Can Walk For a Certain Distance Each Day      Try walking 2 laps around your home, using rollator, 3 times per day.  Every 2-3 days, try to increase your walking distance by 1/2 of a lap.

## 2018-12-08 NOTE — Therapy (Signed)
[facility] [address] [place], [place], [address] Phone: [phone]   Fax:  [phone]  Physical Therapy Treatment  Patient Details  Name: [name] MRN: [id number] Date of Birth: July 29, 1951 Referring Provider (PT): Talbert Cage, MD   Encounter Date: 12/07/2018  PT End of Session - 12/08/18 1350    Visit Number  3    Number of Visits  17    Date for PT Re-Evaluation  02/26/19    Authorization Type  [employer]HC Medicare & Medicaid    Authorization Time Period  Auth period 11/28/18-02/26/19    PT Start Time  1016    PT Stop Time  1059    PT Time Calculation (min)  43 min    Activity Tolerance  Patient tolerated treatment well   Reports slight decrease in pain at end of session   Behavior During Therapy  [facility] for tasks assessed/performed       Past Medical History:  Diagnosis Date  . Anemia   . Arthritis   . Asthma   . Breast cancer of lower-outer quadrant of right female breast ([place]) 02/13/2015  . Cataract   . CHF, acute ([place]) 05/03/2012  . Depression   . Diabetes mellitus   . Eczema   . Fatty liver    Fatty infiltration of liver noted on 03/2012 CT scan  . Fibromyalgia   . GERD (gastroesophageal reflux disease)   . Glaucoma   . Heart disease   . History of kidney stones    w/ hx of hydronephrosis - followed by [facility]  . HIV nonspecific serology    2006: indeterminate HIV blood test, seen by ID, felt secondary to cross reacting antibodies with no further workup felt necessary at that time   . Hypertension   . Obesity   . Personal history of radiation therapy 2016   right  . Radiation 05/07/15-06/23/15   Right Breast    Past Surgical History:  Procedure Laterality Date  . BREAST BIOPSY Left 02/10/2015   malignant   . BREAST LUMPECTOMY Right 03/21/2015  . CHOLECYSTECTOMY  2003  . CYSTOSCOPY W/ LITHOLAPAXY / EHL    . JOINT REPLACEMENT     bilateral knee replacement  . LEFT AND RIGHT  HEART CATHETERIZATION WITH CORONARY ANGIOGRAM N/A 05/02/2012   Procedure: LEFT AND RIGHT HEART CATHETERIZATION WITH CORONARY ANGIOGRAM;  Surgeon: Burnell Blanks, MD;  Location: [facility] CATH LAB;  Service: Cardiovascular;  Laterality: N/A;  . RADIOACTIVE SEED GUIDED PARTIAL MASTECTOMY WITH AXILLARY SENTINEL LYMPH NODE BIOPSY Right 03/21/2015   Procedure: RIGHT  PARTIAL MASTECTOMY WITH RADIOACTIVE SEED LOCALIZATION RIGHT  AXILLARY SENTINEL LYMPH NODE BIOPSY;  Surgeon: Fanny Skates, MD;  Location: [facility];  Service: General;  Laterality: Right;  . REPLACEMENT TOTAL KNEE BILATERAL  2005 &2006  . VASCULAR SURGERY Right 03/15/2013   Ultrasound guided sclerotherapy    There were no vitals filed for this visit.  Subjective Assessment - 12/07/18 1019    Subjective  No changes, just feel sore today due to the cold    Pertinent History  Breast cancer 2015:  Neuropathy; DM type 2, arthritis      Patient Stated Goals  Pt's goal for therapy is to get built back up to do my normal things.    Currently in Pain?  Yes    Pain Score  8     Pain Location  Knee    Pain Orientation  Right;Left    Pain Descriptors /  Indicators  Aching    Pain Type  Chronic pain    Pain Onset  More than a month ago    Pain Frequency  Constant    Aggravating Factors   aggravated by cold    Pain Relieving Factors  medication, hot showers                       [facility] Adult PT Treatment/Exercise - 12/08/18 1346      Ambulation/Gait   Ambulation/Gait  Yes    Ambulation/Gait Assistance  5: Supervision    Ambulation Distance (Feet)  100 Feet   230 ft (with one standing break); 115 ft   Assistive device  Rollator    Gait Pattern  Step-through pattern;Decreased stride length;Trunk flexed;Poor foot clearance - left    Ambulation Surface  Level;Indoor    Gait Comments  Discused and provided walking program for patient, for additional walking for exercise (with rollator) at home      Knee/Hip  Exercises: Aerobic   Nustep  4 extremities, Level 2, x 6 minutes for warm-up, leg flexibility at beginning of session      Knee/Hip Exercises: Standing   Heel Raises  Both;1 set;10 reps    Hip Abduction  AROM;Stengthening;Right;Left;1 set   8 reps step taps   Hip Extension  AROM;Stengthening;Right;Left;1 set;5 reps   step taps   Other Standing Knee Exercises  Lateral weightshifting 8 reps, UEs supported at counter    Other Standing Knee Exercises  Side stepping along counter, 2 reps R and L; standing in place with alternating UE lifts x 8 reps      Knee/Hip Exercises: Seated   Sit to Sand  1 set;10 reps;with UE support   throughout PT session      Standing, gentle squats x 5 reps, for leg strengthening, with UE support      PT Education - 12/08/18 1350    Education provided  Yes    Education Details  Walking program for exercise-see instructions    Person(s) Educated  Patient    Methods  Explanation;Demonstration;Handout    Comprehension  Verbalized understanding;Returned demonstration       PT Short Term Goals - 11/28/18 1459      PT SHORT TERM GOAL #1   Title  Pt will be independent with HEP for improved balance, gait, strength.  TARGET 12/29/18    Time  5    Period  Weeks    Status  New    Target Date  12/29/18      PT SHORT TERM GOAL #2   Title  Pt will perform at least 4 of 5 reps of sit<>stand with UE support, no posterior LOB, for improved safety and efficiency with transfers.    Time  5    Period  Weeks    Status  New    Target Date  12/29/18      PT SHORT TERM GOAL #3   Title  Pt will improve Berg Balance score to at least 23/56 for decreased fall risk.    Time  5    Period  Weeks    Status  New    Target Date  12/29/18      PT SHORT TERM GOAL #4   Title  Pt will improve TUG score to less than or equal to 20 seconds for decreased fall risk.    Time  5    Period  Weeks    Status  New  Target Date  12/29/18        PT Long Term Goals - 11/28/18  1501      PT LONG TERM GOAL #1   Title  Pt will verbalize understanding of fall prevention in home environment.    Time  9    Period  Weeks    Status  New    Target Date  01/27/19      PT LONG TERM GOAL #2   Title  Pt will improve gait velocity to at least 2.3 ft/sec for improved gait efficiency and safety.    Time  9    Period  Weeks    Status  New    Target Date  01/27/19      PT LONG TERM GOAL #3   Title  Pt will improve Berg Balance score to at least 33/56 for decreased fall risk.    Time  9    Period  Weeks    Status  New    Target Date  01/27/19      PT LONG TERM GOAL #4   Title  Pt will improve TUG score to less than or equal to 15 seconds for decreased fall risk    Time  9    Period  Weeks    Status  New    Target Date  01/27/19            Plan - 12/08/18 1351    Clinical Impression Statement  Skilled PT session this visit focused on strengthening exercises and gait activities.  With standing strengthening, with attempts at SLS activities (hip kicks), she becomes unstable, so modifications made for step taps; with lessening UE support, she has instability in standing.  However, she reports she still prefers to walk around home with no device.  Educated patient in walking program for exercise, and she needs to continue to use rollator due to high fall risk without device.  Pt will continue to benefit from skilled PT to address balance, strength, and gait.    Rehab Potential  Good    PT Frequency  2x / week    PT Duration  8 weeks    PT Treatment/Interventions  ADLs/Self Care Home Management;Therapeutic exercise;Patient/family education;Therapeutic activities;Functional mobility training;Gait training;DME Instruction;Balance training;Neuromuscular re-education    PT Next Visit Plan  Repeated transfer training, for functional strengthening, standing strength and balance exercises; review walking program    Consulted and Agree with Plan of Care  Patient        Patient will benefit from skilled therapeutic intervention in order to improve the following deficits and impairments:  Abnormal gait, Decreased balance, Decreased strength, Decreased mobility, Difficulty walking, Postural dysfunction  Visit Diagnosis: Muscle weakness (generalized)  Unsteadiness on feet  Other abnormalities of gait and mobility     Problem List Patient Active Problem List   Diagnosis Date Noted  . Pain in joint, ankle and foot 05/16/2018  . Gout 05/16/2018  . Gait instability 01/03/2018  . Frequent bowel movements 11/02/2017  . Tremor of both hands   . Stuttering   . Essential hypertension   . Hyperlipidemia 08/13/2015  . Breast cancer of lower-outer quadrant of right female breast ([place]) 02/13/2015  . Vitamin D deficiency 02/06/2015  . Type 2 diabetes mellitus with diabetic foot deformity ([place]) 01/01/2014  . Chronic combined systolic and diastolic heart failure ([place]) 02/26/2013  . OBESITY 02/05/2009  . Asthma, intermittent 02/08/2008  . NEPHROLITHIASIS 01/26/2007  . DJD (degenerative joint disease), multiple sites 01/26/2007  Liv Rallis W. 12/08/2018, 1:55 PM Frazier Butt., PT  [facility] [address] [place], [place], [address] Phone: [phone]   Fax:  [phone]  Name: [name] MRN: [id number] Date of Birth: Nov 27, 1951

## 2018-12-11 ENCOUNTER — Ambulatory Visit: Payer: Medicare Other | Admitting: Physical Therapy

## 2018-12-11 ENCOUNTER — Encounter: Payer: Self-pay | Admitting: Family Medicine

## 2018-12-13 ENCOUNTER — Ambulatory Visit: Payer: Medicare Other | Admitting: Physical Therapy

## 2018-12-13 DIAGNOSIS — R2689 Other abnormalities of gait and mobility: Secondary | ICD-10-CM | POA: Diagnosis not present

## 2018-12-13 DIAGNOSIS — R2681 Unsteadiness on feet: Secondary | ICD-10-CM

## 2018-12-13 DIAGNOSIS — M6281 Muscle weakness (generalized): Secondary | ICD-10-CM | POA: Diagnosis not present

## 2018-12-14 NOTE — Therapy (Signed)
[facility] [address] [place], [place], [address] Phone: [phone]   Fax:  [phone]  Physical Therapy Treatment  Patient Details  Name: [name] MRN: [id number] Date of Birth: 05-21-1951 Referring Provider (PT): Talbert Cage, MD   Encounter Date: 12/13/2018  PT End of Session - 12/14/18 1155    Visit Number  4    Number of Visits  17    Date for PT Re-Evaluation  02/26/19    Authorization Type  UHC Medicare & Medicaid    Authorization Time Period  Auth period 11/28/18-02/26/19    PT Start Time  1017    PT Stop Time  1102    PT Time Calculation (min)  45 min    Activity Tolerance  Patient tolerated treatment well   Reports slight decrease in pain at end of session   Behavior During Therapy  [facility] for tasks assessed/performed       Past Medical History:  Diagnosis Date  . Anemia   . Arthritis   . Asthma   . Breast cancer of lower-outer quadrant of right female breast ([place]) 02/13/2015  . Cataract   . CHF, acute ([place]) 05/03/2012  . Depression   . Diabetes mellitus   . Eczema   . Fatty liver    Fatty infiltration of liver noted on 03/2012 CT scan  . Fibromyalgia   . GERD (gastroesophageal reflux disease)   . Glaucoma   . Heart disease   . History of kidney stones    w/ hx of hydronephrosis - followed by [facility]  . HIV nonspecific serology    2006: indeterminate HIV blood test, seen by ID, felt secondary to cross reacting antibodies with no further workup felt necessary at that time   . Hypertension   . Obesity   . Personal history of radiation therapy 2016   right  . Radiation 05/07/15-06/23/15   Right Breast    Past Surgical History:  Procedure Laterality Date  . BREAST BIOPSY Left 02/10/2015   malignant   . BREAST LUMPECTOMY Right 03/21/2015  . CHOLECYSTECTOMY  2003  . CYSTOSCOPY W/ LITHOLAPAXY / EHL    . JOINT REPLACEMENT     bilateral knee replacement  . LEFT AND RIGHT  HEART CATHETERIZATION WITH CORONARY ANGIOGRAM N/A 05/02/2012   Procedure: LEFT AND RIGHT HEART CATHETERIZATION WITH CORONARY ANGIOGRAM;  Surgeon: Burnell Blanks, MD;  Location: [facility] CATH LAB;  Service: Cardiovascular;  Laterality: N/A;  . RADIOACTIVE SEED GUIDED PARTIAL MASTECTOMY WITH AXILLARY SENTINEL LYMPH NODE BIOPSY Right 03/21/2015   Procedure: RIGHT  PARTIAL MASTECTOMY WITH RADIOACTIVE SEED LOCALIZATION RIGHT  AXILLARY SENTINEL LYMPH NODE BIOPSY;  Surgeon: Fanny Skates, MD;  Location: [facility];  Service: General;  Laterality: Right;  . REPLACEMENT TOTAL KNEE BILATERAL  2005 &2006  . VASCULAR SURGERY Right 03/15/2013   Ultrasound guided sclerotherapy    There were no vitals filed for this visit.  Subjective Assessment - 12/13/18 1023    Subjective  Missed Monday's appt due to transportation not coming to get me.    Pertinent History  Breast cancer 2015:  Neuropathy; DM type 2, arthritis      Patient Stated Goals  Pt's goal for therapy is to get built back up to do my normal things.    Currently in Pain?  Yes    Pain Score  6     Pain Location  Knee   and wrist   Pain Orientation  Right;Left  Pain Descriptors / Indicators  Aching    Pain Onset  More than a month ago    Pain Frequency  Constant    Aggravating Factors   aggravated by cold    Pain Relieving Factors  medication, hot showers                       [facility] Adult PT Treatment/Exercise - 12/14/18 1153      Transfers   Transfers  Sit to Stand;Stand to Sit    Sit to Stand  5: Supervision;With upper extremity assist;From chair/3-in-1    Stand to Sit  5: Supervision;With upper extremity assist;To chair/3-in-1    Number of Reps  --   5 reps for functional strengthening through session     Ambulation/Gait   Ambulation/Gait  Yes    Ambulation/Gait Assistance  5: Supervision    Ambulation/Gait Assistance Details  Cues to stay close to rollator, upright posture    Ambulation Distance  (Feet)  150 Feet   225 ft no break, 100 ft x 2   Assistive device  Rollator    Gait Pattern  Step-through pattern;Decreased stride length;Trunk flexed;Poor foot clearance - left    Ambulation Surface  Level;Indoor    Gait Comments  Reviewed walking program from last visit-pt reports walking 3 laps around the house, once per day      High Level Balance   High Level Balance Activities  Side stepping;Tandem walking    High Level Balance Comments  Marching in place x 10 reps, then forward/back walking 3 laps in parallel bars      Knee/Hip Exercises: Aerobic   Nustep  4 extremities, Level 4, x 8 minutes for leg strengthening, flexibility          Balance Exercises - 12/13/18 1045      Balance Exercises: Standing   Stepping Strategy  Posterior;Lateral;UE support;10 reps   At counter, LOB x 1 to L side; cues for control   Heel Raises Limitations  10 reps    Toe Raise Limitations  10 reps    Other Standing Exercises  Hip strategy 10 reps at counter (anterior/posterior)          PT Short Term Goals - 11/28/18 1459      PT SHORT TERM GOAL #1   Title  Pt will be independent with HEP for improved balance, gait, strength.  TARGET 12/29/18    Time  5    Period  Weeks    Status  New    Target Date  12/29/18      PT SHORT TERM GOAL #2   Title  Pt will perform at least 4 of 5 reps of sit<>stand with UE support, no posterior LOB, for improved safety and efficiency with transfers.    Time  5    Period  Weeks    Status  New    Target Date  12/29/18      PT SHORT TERM GOAL #3   Title  Pt will improve Berg Balance score to at least 23/56 for decreased fall risk.    Time  5    Period  Weeks    Status  New    Target Date  12/29/18      PT SHORT TERM GOAL #4   Title  Pt will improve TUG score to less than or equal to 20 seconds for decreased fall risk.    Time  5    Period  Weeks  Status  New    Target Date  12/29/18        PT Long Term Goals - 11/28/18 1501      PT LONG  TERM GOAL #1   Title  Pt will verbalize understanding of fall prevention in home environment.    Time  9    Period  Weeks    Status  New    Target Date  01/27/19      PT LONG TERM GOAL #2   Title  Pt will improve gait velocity to at least 2.3 ft/sec for improved gait efficiency and safety.    Time  9    Period  Weeks    Status  New    Target Date  01/27/19      PT LONG TERM GOAL #3   Title  Pt will improve Berg Balance score to at least 33/56 for decreased fall risk.    Time  9    Period  Weeks    Status  New    Target Date  01/27/19      PT LONG TERM GOAL #4   Title  Pt will improve TUG score to less than or equal to 15 seconds for decreased fall risk    Time  9    Period  Weeks    Status  New    Target Date  01/27/19            Plan - 12/14/18 1156    Clinical Impression Statement  Skilled PT session today focused on strengthening, balance and gait training.  Pt is able to increase resistance and time on Nustep for improved flexibility and strengthening, and she is able to ambulate greater distance today without requiring rest break.  Balance activities focused on ankle, hip, step strategies due to pt having several episodes of quick LOB, requiring external support to regain balance.  She will continue to benefit from skilled PT to address balance, strength, and gait.    Rehab Potential  Good    PT Frequency  2x / week    PT Duration  8 weeks    PT Treatment/Interventions  ADLs/Self Care Home Management;Therapeutic exercise;Patient/family education;Therapeutic activities;Functional mobility training;Gait training;DME Instruction;Balance training;Neuromuscular re-education    PT Next Visit Plan  Repeated transfer training, for functional strengthening, standing strength and balance exercises including ankle, hip, step strategies; add to HEP for standing exercises as able    Consulted and Agree with Plan of Care  Patient       Patient will benefit from skilled  therapeutic intervention in order to improve the following deficits and impairments:  Abnormal gait, Decreased balance, Decreased strength, Decreased mobility, Difficulty walking, Postural dysfunction  Visit Diagnosis: Muscle weakness (generalized)  Unsteadiness on feet  Other abnormalities of gait and mobility     Problem List Patient Active Problem List   Diagnosis Date Noted  . Pain in joint, ankle and foot 05/16/2018  . Gout 05/16/2018  . Gait instability 01/03/2018  . Frequent bowel movements 11/02/2017  . Tremor of both hands   . Stuttering   . Essential hypertension   . Hyperlipidemia 08/13/2015  . Breast cancer of lower-outer quadrant of right female breast ([place]) 02/13/2015  . Vitamin D deficiency 02/06/2015  . Type 2 diabetes mellitus with diabetic foot deformity ([place]) 01/01/2014  . Chronic combined systolic and diastolic heart failure ([place]) 02/26/2013  . OBESITY 02/05/2009  . Asthma, intermittent 02/08/2008  . NEPHROLITHIASIS 01/26/2007  . DJD (degenerative joint disease), multiple  sites 01/26/2007    Laverne Klugh W. 12/14/2018, 12:00 PM Frazier Butt., PT  [facility] [address] [place], [place], [address] Phone: [phone]   Fax:  [phone]  Name: [name] MRN: [id number] Date of Birth: 08-Jun-1951

## 2018-12-18 ENCOUNTER — Encounter: Payer: Self-pay | Admitting: Physical Therapy

## 2018-12-18 ENCOUNTER — Ambulatory Visit: Payer: Medicare Other | Admitting: Physical Therapy

## 2018-12-18 DIAGNOSIS — R2689 Other abnormalities of gait and mobility: Secondary | ICD-10-CM | POA: Diagnosis not present

## 2018-12-18 DIAGNOSIS — M6281 Muscle weakness (generalized): Secondary | ICD-10-CM | POA: Diagnosis not present

## 2018-12-18 DIAGNOSIS — R2681 Unsteadiness on feet: Secondary | ICD-10-CM

## 2018-12-18 NOTE — Therapy (Signed)
[facility] [address] [place], [place], [address] Phone: [phone]   Fax:  [phone]  Physical Therapy Treatment  Patient Details  Name: [name] MRN: [id number] Date of Birth: 1951-07-11 Referring Provider (PT): Talbert Cage, MD   Encounter Date: 12/18/2018  PT End of Session - 12/18/18 1100    Visit Number  5    Number of Visits  17    Date for PT Re-Evaluation  02/26/19    Authorization Type  UHC Medicare & Medicaid    Authorization Time Period  Auth period 11/28/18-02/26/19    PT Start Time  1020    PT Stop Time  1058    PT Time Calculation (min)  38 min    Activity Tolerance  Patient tolerated treatment well   Reports slight decrease in pain at end of session   Behavior During Therapy  [facility] for tasks assessed/performed       Past Medical History:  Diagnosis Date  . Anemia   . Arthritis   . Asthma   . Breast cancer of lower-outer quadrant of right female breast ([place]) 02/13/2015  . Cataract   . CHF, acute ([place]) 05/03/2012  . Depression   . Diabetes mellitus   . Eczema   . Fatty liver    Fatty infiltration of liver noted on 03/2012 CT scan  . Fibromyalgia   . GERD (gastroesophageal reflux disease)   . Glaucoma   . Heart disease   . History of kidney stones    w/ hx of hydronephrosis - followed by [facility]  . HIV nonspecific serology    2006: indeterminate HIV blood test, seen by ID, felt secondary to cross reacting antibodies with no further workup felt necessary at that time   . Hypertension   . Obesity   . Personal history of radiation therapy 2016   right  . Radiation 05/07/15-06/23/15   Right Breast    Past Surgical History:  Procedure Laterality Date  . BREAST BIOPSY Left 02/10/2015   malignant   . BREAST LUMPECTOMY Right 03/21/2015  . CHOLECYSTECTOMY  2003  . CYSTOSCOPY W/ LITHOLAPAXY / EHL    . JOINT REPLACEMENT     bilateral knee replacement  . LEFT AND RIGHT  HEART CATHETERIZATION WITH CORONARY ANGIOGRAM N/A 05/02/2012   Procedure: LEFT AND RIGHT HEART CATHETERIZATION WITH CORONARY ANGIOGRAM;  Surgeon: Burnell Blanks, MD;  Location: [facility] CATH LAB;  Service: Cardiovascular;  Laterality: N/A;  . RADIOACTIVE SEED GUIDED PARTIAL MASTECTOMY WITH AXILLARY SENTINEL LYMPH NODE BIOPSY Right 03/21/2015   Procedure: RIGHT  PARTIAL MASTECTOMY WITH RADIOACTIVE SEED LOCALIZATION RIGHT  AXILLARY SENTINEL LYMPH NODE BIOPSY;  Surgeon: Fanny Skates, MD;  Location: [place];  Service: General;  Laterality: Right;  . REPLACEMENT TOTAL KNEE BILATERAL  2005 &2006  . VASCULAR SURGERY Right 03/15/2013   Ultrasound guided sclerotherapy    There were no vitals filed for this visit.  Subjective Assessment - 12/18/18 1022    Subjective  No falls. Working on walking everyday and other exercises. Reports doing the standing exercises that were given to her when she was coming last May 2019.    Pertinent History  Breast cancer 2015:  Neuropathy; DM type 2, arthritis      Patient Stated Goals  Pt's goal for therapy is to get built back up to do my normal things.    Currently in Pain?  Yes    Pain Score  6  Pain Location  Knee    Pain Orientation  Right;Left    Pain Descriptors / Indicators  Aching    Pain Type  Chronic pain    Pain Onset  More than a month ago                       [facility] Adult PT Treatment/Exercise - 12/18/18 0001      Ambulation/Gait   Ambulation/Gait  Yes    Ambulation/Gait Assistance  4: Min guard;4: Min assist    Ambulation/Gait Assistance Details  gait training in various direction with [place] working on balance and awareness of foot position.    Ambulation Distance (Feet)  40 Feet   x2   Assistive device  Straight cane    Gait Pattern  Step-through pattern;Decreased stride length;Trunk flexed;Poor foot clearance - left    Ambulation Surface  Level;Indoor    Gait Comments  -pt reports walking 3 laps around the  house, once per day      Knee/Hip Exercises: Aerobic   Nustep  4 extremities, Level 4, 10 min.          Balance Exercises - 12/18/18 1031      Balance Exercises: Standing   Standing Eyes Opened  Wide (BOA)   working on ankle/hip strategies with weight shifting, upper trunk rotations, and bilateral UE activity.   Retro Gait  Other reps (comment)   with SPC, Min guard to Min A, walking forward then backwards to target cones.   Marching Limitations  in parallel bars: forward with 1 UE support.    Heel Raises Limitations  x10; reports pain in heel due to bone spur in foot.    Sit to Stand Time  5x2 from mat with UE support,cue for body mechanics.        PT Education - 12/18/18 1212    Education provided  Yes    Education Details  verbally reviewed current HEP    Person(s) Educated  Patient    Methods  Explanation    Comprehension  Verbalized understanding       PT Short Term Goals - 11/28/18 1459      PT SHORT TERM GOAL #1   Title  Pt will be independent with HEP for improved balance, gait, strength.  TARGET 12/29/18    Time  5    Period  Weeks    Status  New    Target Date  12/29/18      PT SHORT TERM GOAL #2   Title  Pt will perform at least 4 of 5 reps of sit<>stand with UE support, no posterior LOB, for improved safety and efficiency with transfers.    Time  5    Period  Weeks    Status  New    Target Date  12/29/18      PT SHORT TERM GOAL #3   Title  Pt will improve Berg Balance score to at least 23/56 for decreased fall risk.    Time  5    Period  Weeks    Status  New    Target Date  12/29/18      PT SHORT TERM GOAL #4   Title  Pt will improve TUG score to less than or equal to 20 seconds for decreased fall risk.    Time  5    Period  Weeks    Status  New    Target Date  12/29/18  PT Long Term Goals - 11/28/18 1501      PT LONG TERM GOAL #1   Title  Pt will verbalize understanding of fall prevention in home environment.    Time  9     Period  Weeks    Status  New    Target Date  01/27/19      PT LONG TERM GOAL #2   Title  Pt will improve gait velocity to at least 2.3 ft/sec for improved gait efficiency and safety.    Time  9    Period  Weeks    Status  New    Target Date  01/27/19      PT LONG TERM GOAL #3   Title  Pt will improve Berg Balance score to at least 33/56 for decreased fall risk.    Time  9    Period  Weeks    Status  New    Target Date  01/27/19      PT LONG TERM GOAL #4   Title  Pt will improve TUG score to less than or equal to 15 seconds for decreased fall risk    Time  9    Period  Weeks    Status  New    Target Date  01/27/19            Plan - 12/18/18 1213    Clinical Impression Statement  Skilled session focused on standing balance without UE support, dynamic stepping with 1 UE support, LE strengthening, dynamic gait with SPC.  Pt requires min guard to Min A during gait with SPC due to instability and at times scissors feet.  Pt requires min guard to min A with dynamic standing balance without UE support.                                                                            Rehab Potential  Good    PT Frequency  2x / week    PT Duration  8 weeks    PT Treatment/Interventions  ADLs/Self Care Home Management;Therapeutic exercise;Patient/family education;Therapeutic activities;Functional mobility training;Gait training;DME Instruction;Balance training;Neuromuscular re-education    PT Next Visit Plan  Repeated transfer training, for functional strengthening, standing strength and balance exercises including ankle, hip, step strategies; add to HEP for standing exercises as able    Consulted and Agree with Plan of Care  Patient       Patient will benefit from skilled therapeutic intervention in order to improve the following deficits and impairments:  Abnormal gait, Decreased balance, Decreased strength, Decreased mobility, Difficulty walking, Postural dysfunction  Visit  Diagnosis: Muscle weakness (generalized)  Unsteadiness on feet  Other abnormalities of gait and mobility     Problem List Patient Active Problem List   Diagnosis Date Noted  . Pain in joint, ankle and foot 05/16/2018  . Gout 05/16/2018  . Gait instability 01/03/2018  . Frequent bowel movements 11/02/2017  . Tremor of both hands   . Stuttering   . Essential hypertension   . Hyperlipidemia 08/13/2015  . Breast cancer of lower-outer quadrant of right female breast ([place]) 02/13/2015  . Vitamin D deficiency 02/06/2015  . Type 2 diabetes mellitus with diabetic foot deformity ([facility])  01/01/2014  . Chronic combined systolic and diastolic heart failure ([place]) 02/26/2013  . OBESITY 02/05/2009  . Asthma, intermittent 02/08/2008  . NEPHROLITHIASIS 01/26/2007  . DJD (degenerative joint disease), multiple sites 01/26/2007    Bjorn Loser, PTA  12/18/18, 12:21 PM [facility] [address] [place], [place], [address] Phone: [phone]   Fax:  [phone]  Name: [name] MRN: [id number] Date of Birth: 12-06-1950

## 2018-12-20 ENCOUNTER — Ambulatory Visit: Payer: Medicare Other | Admitting: Physical Therapy

## 2018-12-20 ENCOUNTER — Encounter: Payer: Self-pay | Admitting: Physical Therapy

## 2018-12-20 DIAGNOSIS — R2681 Unsteadiness on feet: Secondary | ICD-10-CM | POA: Diagnosis not present

## 2018-12-20 DIAGNOSIS — M6281 Muscle weakness (generalized): Secondary | ICD-10-CM | POA: Diagnosis not present

## 2018-12-20 DIAGNOSIS — R2689 Other abnormalities of gait and mobility: Secondary | ICD-10-CM

## 2018-12-20 NOTE — Therapy (Signed)
[facility] [address] [place], [place], [address] Phone: [phone]   Fax:  [phone]  Physical Therapy Treatment  Patient Details  Name: [name] MRN: [id number] Date of Birth: December 01, 1950 Referring Provider (PT): Talbert Cage, MD   Encounter Date: 12/20/2018  PT End of Session - 12/20/18 1056    Visit Number  6    Number of Visits  17    Date for PT Re-Evaluation  02/26/19    Authorization Type  UHC Medicare & Medicaid    Authorization Time Period  Auth period 11/28/18-02/26/19    PT Start Time  1017    PT Stop Time  1055    PT Time Calculation (min)  38 min    Activity Tolerance  Patient tolerated treatment well   Reports slight decrease in pain at end of session   Behavior During Therapy  [facility] for tasks assessed/performed       Past Medical History:  Diagnosis Date  . Anemia   . Arthritis   . Asthma   . Breast cancer of lower-outer quadrant of right female breast ([place]) 02/13/2015  . Cataract   . CHF, acute ([place]) 05/03/2012  . Depression   . Diabetes mellitus   . Eczema   . Fatty liver    Fatty infiltration of liver noted on 03/2012 CT scan  . Fibromyalgia   . GERD (gastroesophageal reflux disease)   . Glaucoma   . Heart disease   . History of kidney stones    w/ hx of hydronephrosis - followed by [facility]  . HIV nonspecific serology    2006: indeterminate HIV blood test, seen by ID, felt secondary to cross reacting antibodies with no further workup felt necessary at that time   . Hypertension   . Obesity   . Personal history of radiation therapy 2016   right  . Radiation 05/07/15-06/23/15   Right Breast    Past Surgical History:  Procedure Laterality Date  . BREAST BIOPSY Left 02/10/2015   malignant   . BREAST LUMPECTOMY Right 03/21/2015  . CHOLECYSTECTOMY  2003  . CYSTOSCOPY W/ LITHOLAPAXY / EHL    . JOINT REPLACEMENT     bilateral knee replacement  . LEFT AND RIGHT  HEART CATHETERIZATION WITH CORONARY ANGIOGRAM N/A 05/02/2012   Procedure: LEFT AND RIGHT HEART CATHETERIZATION WITH CORONARY ANGIOGRAM;  Surgeon: Burnell Blanks, MD;  Location: [facility] CATH LAB;  Service: Cardiovascular;  Laterality: N/A;  . RADIOACTIVE SEED GUIDED PARTIAL MASTECTOMY WITH AXILLARY SENTINEL LYMPH NODE BIOPSY Right 03/21/2015   Procedure: RIGHT  PARTIAL MASTECTOMY WITH RADIOACTIVE SEED LOCALIZATION RIGHT  AXILLARY SENTINEL LYMPH NODE BIOPSY;  Surgeon: Fanny Skates, MD;  Location: [facility];  Service: General;  Laterality: Right;  . REPLACEMENT TOTAL KNEE BILATERAL  2005 &2006  . VASCULAR SURGERY Right 03/15/2013   Ultrasound guided sclerotherapy    There were no vitals filed for this visit.  Subjective Assessment - 12/20/18 1019    Subjective  Nothing new to report.    Pertinent History  Breast cancer 2015:  Neuropathy; DM type 2, arthritis      Patient Stated Goals  Pt's goal for therapy is to get built back up to do my normal things.    Pain Score  6     Pain Location  Knee    Pain Orientation  Right;Left    Pain Descriptors / Indicators  Aching    Pain Type  Chronic pain  Pain Onset  More than a month ago    Pain Frequency  Constant                       [facility] Adult PT Treatment/Exercise - 12/20/18 0001      Transfers   Stand to Sit  5: Supervision;With upper extremity assist   5x2     Ambulation/Gait   Ambulation/Gait  Yes    Ambulation/Gait Assistance  4: Min guard    Ambulation/Gait Assistance Details  working on balance and activity tolerance; cues for visual scanning.   Ambulation Distance (Feet)  230 Feet   + 150   Assistive device  Straight cane    Gait Pattern  Step-through pattern;Decreased stride length;Trunk flexed;Poor foot clearance - left    Ambulation Surface  Level;Indoor      Knee/Hip Exercises: Stretches   Active Hamstring Stretch  Both;2 reps;30 seconds   seated     Knee/Hip Exercises: Supine    Bridges  Strengthening;Both;2 sets;5 reps    Straight Leg Raises  Strengthening;Right;Left;2 sets;5 reps          Balance Exercises - 12/20/18 1040      Balance Exercises: Standing   SLS with Vectors  Solid surface   using SPC, alternate and multidirectional tapping cones, min guard to min A.   Stepping Strategy  Anterior;Posterior;Lateral;UE support   using SPC for support, min guard to min A.         PT Short Term Goals - 11/28/18 1459      PT SHORT TERM GOAL #1   Title  Pt will be independent with HEP for improved balance, gait, strength.  TARGET 12/29/18    Time  5    Period  Weeks    Status  New    Target Date  12/29/18      PT SHORT TERM GOAL #2   Title  Pt will perform at least 4 of 5 reps of sit<>stand with UE support, no posterior LOB, for improved safety and efficiency with transfers.    Time  5    Period  Weeks    Status  New    Target Date  12/29/18      PT SHORT TERM GOAL #3   Title  Pt will improve Berg Balance score to at least 23/56 for decreased fall risk.    Time  5    Period  Weeks    Status  New    Target Date  12/29/18      PT SHORT TERM GOAL #4   Title  Pt will improve TUG score to less than or equal to 20 seconds for decreased fall risk.    Time  5    Period  Weeks    Status  New    Target Date  12/29/18        PT Long Term Goals - 11/28/18 1501      PT LONG TERM GOAL #1   Title  Pt will verbalize understanding of fall prevention in home environment.    Time  9    Period  Weeks    Status  New    Target Date  01/27/19      PT LONG TERM GOAL #2   Title  Pt will improve gait velocity to at least 2.3 ft/sec for improved gait efficiency and safety.    Time  9    Period  Weeks    Status  New  Target Date  01/27/19      PT LONG TERM GOAL #3   Title  Pt will improve Berg Balance score to at least 33/56 for decreased fall risk.    Time  9    Period  Weeks    Status  New    Target Date  01/27/19      PT LONG TERM GOAL #4    Title  Pt will improve TUG score to less than or equal to 15 seconds for decreased fall risk    Time  9    Period  Weeks    Status  New    Target Date  01/27/19            Plan - 12/20/18 1048    Clinical Impression Statement  Skilled session focused on dynamic standing balance with stepping and modified SLS activities, gait with SPC to work on balance and activity tolerance, and LE strengthening and stretching.  Pt progressed with gait distance, requiring min guard to close supervision.  Supine hip and quad strengthening exercises were challenging for pt, but pt seemed to able to tolerate low reps.  Dynamic standing balance pt required min guard to min A with SPC for support.                                                                          Rehab Potential  Good    PT Frequency  2x / week    PT Duration  8 weeks    PT Treatment/Interventions  ADLs/Self Care Home Management;Therapeutic exercise;Patient/family education;Therapeutic activities;Functional mobility training;Gait training;DME Instruction;Balance training;Neuromuscular re-education    PT Next Visit Plan  check STGs next week.    Consulted and Agree with Plan of Care  Patient       Patient will benefit from skilled therapeutic intervention in order to improve the following deficits and impairments:  Abnormal gait, Decreased balance, Decreased strength, Decreased mobility, Difficulty walking, Postural dysfunction  Visit Diagnosis: Muscle weakness (generalized)  Unsteadiness on feet  Other abnormalities of gait and mobility     Problem List Patient Active Problem List   Diagnosis Date Noted  . Pain in joint, ankle and foot 05/16/2018  . Gout 05/16/2018  . Gait instability 01/03/2018  . Frequent bowel movements 11/02/2017  . Tremor of both hands   . Stuttering   . Essential hypertension   . Hyperlipidemia 08/13/2015  . Breast cancer of lower-outer quadrant of right female breast ([place]) 02/13/2015  .  Vitamin D deficiency 02/06/2015  . Type 2 diabetes mellitus with diabetic foot deformity ([place]) 01/01/2014  . Chronic combined systolic and diastolic heart failure ([place]) 02/26/2013  . OBESITY 02/05/2009  . Asthma, intermittent 02/08/2008  . NEPHROLITHIASIS 01/26/2007  . DJD (degenerative joint disease), multiple sites 01/26/2007    Bjorn Loser, PTA  12/20/18, 1:11 PM [facility] [address] [place], [place], [address] Phone: [phone]   Fax:  [phone]  Name: [name] MRN: [id number] Date of Birth: Aug 25, 1951

## 2018-12-25 ENCOUNTER — Ambulatory Visit: Payer: Medicare Other | Admitting: Physical Therapy

## 2018-12-25 ENCOUNTER — Encounter: Payer: Self-pay | Admitting: Physical Therapy

## 2018-12-25 DIAGNOSIS — R2681 Unsteadiness on feet: Secondary | ICD-10-CM | POA: Diagnosis not present

## 2018-12-25 DIAGNOSIS — M6281 Muscle weakness (generalized): Secondary | ICD-10-CM

## 2018-12-25 DIAGNOSIS — R2689 Other abnormalities of gait and mobility: Secondary | ICD-10-CM

## 2018-12-25 NOTE — Therapy (Signed)
[facility] [address] [place], [place], [address] Phone: [phone]   Fax:  [phone]  Physical Therapy Treatment  Patient Details  Name: [name] MRN: [id number] Date of Birth: 15-Apr-1951 Referring Provider (PT): Talbert Cage, MD   Encounter Date: 12/25/2018  PT End of Session - 12/25/18 1213    Visit Number  7    Number of Visits  17    Date for PT Re-Evaluation  02/26/19    Authorization Type  UHC Medicare & Medicaid    Authorization Time Period  Auth period 11/28/18-02/26/19    PT Start Time  1103    PT Stop Time  1144    PT Time Calculation (min)  41 min    Equipment Utilized During Treatment  Gait belt    Activity Tolerance  Patient tolerated treatment well   Reports slight decrease in pain to 5/10 at end of session   Behavior During Therapy  [facility] for tasks assessed/performed       Past Medical History:  Diagnosis Date  . Anemia   . Arthritis   . Asthma   . Breast cancer of lower-outer quadrant of right female breast ([place]) 02/13/2015  . Cataract   . CHF, acute ([place]) 05/03/2012  . Depression   . Diabetes mellitus   . Eczema   . Fatty liver    Fatty infiltration of liver noted on 03/2012 CT scan  . Fibromyalgia   . GERD (gastroesophageal reflux disease)   . Glaucoma   . Heart disease   . History of kidney stones    w/ hx of hydronephrosis - followed by [facility]  . HIV nonspecific serology    2006: indeterminate HIV blood test, seen by ID, felt secondary to cross reacting antibodies with no further workup felt necessary at that time   . Hypertension   . Obesity   . Personal history of radiation therapy 2016   right  . Radiation 05/07/15-06/23/15   Right Breast    Past Surgical History:  Procedure Laterality Date  . BREAST BIOPSY Left 02/10/2015   malignant   . BREAST LUMPECTOMY Right 03/21/2015  . CHOLECYSTECTOMY  2003  . CYSTOSCOPY W/ LITHOLAPAXY / EHL    . JOINT  REPLACEMENT     bilateral knee replacement  . LEFT AND RIGHT HEART CATHETERIZATION WITH CORONARY ANGIOGRAM N/A 05/02/2012   Procedure: LEFT AND RIGHT HEART CATHETERIZATION WITH CORONARY ANGIOGRAM;  Surgeon: Burnell Blanks, MD;  Location: [facility] C[facility] LAB;  Service: Cardiovascular;  Laterality: N/A;  . RADIOACTIVE SEED GUIDED PARTIAL MASTECTOMY WITH AXILLARY SENTINEL LYMPH NODE BIOPSY Right 03/21/2015   Procedure: RIGHT  PARTIAL MASTECTOMY WITH RADIOACTIVE SEED LOCALIZATION RIGHT  AXILLARY SENTINEL LYMPH NODE BIOPSY;  Surgeon: Fanny Skates, MD;  Location: [facility];  Service: General;  Laterality: Right;  . REPLACEMENT TOTAL KNEE BILATERAL  2005 &2006  . VASCULAR SURGERY Right 03/15/2013   Ultrasound guided sclerotherapy    There were no vitals filed for this visit.  Subjective Assessment - 12/25/18 1107    Subjective  No changes.  Do my exercises, but my heel spurs flare up sometimes.  I just take breaks throughout the exercise.    Pertinent History  Breast cancer 2015:  Neuropathy; DM type 2, arthritis      Patient Stated Goals  Pt's goal for therapy is to get built back up to do my normal things.    Currently in Pain?  Yes    Pain  Score  7     Pain Location  Knee    Pain Orientation  Right;Left    Pain Descriptors / Indicators  Aching    Pain Type  Chronic pain    Pain Onset  More than a month ago    Pain Frequency  Constant    Aggravating Factors   aggravated by cold    Pain Relieving Factors  medication, hot showers         [facility] PT Assessment - 12/25/18 0001      Berg Balance Test   Sit to Stand  Able to stand  independently using hands    Standing Unsupported  Able to stand 2 minutes with supervision    Sitting with Back Unsupported but Feet Supported on Floor or Stool  Able to sit safely and securely 2 minutes    Stand to Sit  Controls descent by using hands    Transfers  Able to transfer with verbal cueing and /or supervision    Standing Unsupported  with Eyes Closed  Able to stand 10 seconds with supervision    Standing Ubsupported with Feet Together  Needs help to attain position but able to stand for 30 seconds with feet together    From Standing, Reach Forward with Outstretched Arm  Reaches forward but needs supervision    From Standing Position, Pick up Object from Floor  Unable to try/needs assist to keep balance    From Standing Position, Turn to Look Behind Over each Shoulder  Looks behind from both sides and weight shifts well    Turn 360 Degrees  Needs assistance while turning    Standing Unsupported, Alternately Place Feet on Step/Stool  Needs assistance to keep from falling or unable to try    Standing Unsupported, One Foot in [employer] balance while stepping or standing    Standing on One Leg  Unable to try or needs assist to prevent fall    Total Score  24      Timed Up and Go Test   TUG  Normal TUG    Normal TUG (seconds)  15.81    TUG Comments  Improved from 25.28 sec at eval                   [facility] Adult PT Treatment/Exercise - 12/25/18 0001      Transfers   Transfers  Sit to Stand;Stand to Sit    Sit to Stand  5: Supervision;With upper extremity assist;From chair/3-in-1;From bed    Stand to Sit  5: Supervision;With upper extremity assist;To chair/3-in-1;To bed    Number of Reps  10 reps   throughout session; 5 reps from chair, UE support   Transfer Cueing  Cues for slowed descent into sitting, but pt able to perform 5 reps of sit<>stand no posterior lean.      High Level Balance   High Level Balance Comments  At counter:  Heel/toe raises-gentl rocking for ankle strategy for balance, x 10 reps, then rocking forward/back through hips at counter, gentle rocking for hip strategy work x 10 reps; stagger stance foot position forward/back rocking x 10 reps each position; gentle mini squat position to terminal knee extension with cues for glut/quad activation x 10 reps.  Discussed these movements in relation  to standing positioning and balance recovery at home.             PT Education - 12/25/18 1212    Education provided  Yes  Education Details  Progress towards goals, pt still at fall risk, still needs to use rollator in home for safety    Person(s) Educated  Patient    Methods  Explanation    Comprehension  Verbalized understanding;Verbal cues required       PT Short Term Goals - 12/25/18 1130      PT SHORT TERM GOAL #1   Title  Pt will be independent with HEP for improved balance, gait, strength.  TARGET 12/29/18    Time  5    Period  Weeks    Status  New      PT SHORT TERM GOAL #2   Title  Pt will perform at least 4 of 5 reps of sit<>stand with UE support, no posterior LOB, for improved safety and efficiency with transfers.    Time  5    Period  Weeks    Status  Achieved      PT SHORT TERM GOAL #3   Title  Pt will improve Berg Balance score to at least 23/56 for decreased fall risk.    Baseline  24/56 12/25/2018    Time  5    Period  Weeks    Status  Achieved      PT SHORT TERM GOAL #4   Title  Pt will improve TUG score to less than or equal to 20 seconds for decreased fall risk.    Baseline  15.81 sec 12/25/2018    Time  5    Period  Weeks    Status  Achieved        PT Long Term Goals - 11/28/18 1501      PT LONG TERM GOAL #1   Title  Pt will verbalize understanding of fall prevention in home environment.    Time  9    Period  Weeks    Status  New    Target Date  01/27/19      PT LONG TERM GOAL #2   Title  Pt will improve gait velocity to at least 2.3 ft/sec for improved gait efficiency and safety.    Time  9    Period  Weeks    Status  New    Target Date  01/27/19      PT LONG TERM GOAL #3   Title  Pt will improve Berg Balance score to at least 33/56 for decreased fall risk.    Time  9    Period  Weeks    Status  New    Target Date  01/27/19      PT LONG TERM GOAL #4   Title  Pt will improve TUG score to less than or equal to 15 seconds for  decreased fall risk    Time  9    Period  Weeks    Status  New    Target Date  01/27/19            Plan - 12/25/18 1214    Clinical Impression Statement  Began assessing STGs this visit, with pt meeting STG 2, 3, 4.  Improvements noted in Berg score from 13/56>24/56 and TUG score from 25.28 sec to 15.85 seconds.  Pt able to demonstrate sit<>stand x 5 reps with no posterior lean; however, with sit<>stand transitions during [employer] test, pt has several instances of needing min guard assistance due to uncontrolled sit with preceding posterior lean.  Began to address standing balance and weightshifting/balance strategies at counter this visit, with pt responding  well to performing these activities.  Pt has made nice progress towards STGs; however, pt remains at fall risk and would benefit from continued PT to address balance, strength, and gait training.    Rehab Potential  Good    PT Frequency  2x / week    PT Duration  8 weeks    PT Treatment/Interventions  ADLs/Self Care Home Management;Therapeutic exercise;Patient/family education;Therapeutic activities;Functional mobility training;Gait training;DME Instruction;Balance training;Neuromuscular re-education    PT Next Visit Plan  Check HEP for STG 1; add hip/ankle strategy work at counter, stagger stance weightshifting and terminal knee extension to HEP if pt tolerates well again next visit.    Consulted and Agree with Plan of Care  Patient       Patient will benefit from skilled therapeutic intervention in order to improve the following deficits and impairments:  Abnormal gait, Decreased balance, Decreased strength, Decreased mobility, Difficulty walking, Postural dysfunction  Visit Diagnosis: Unsteadiness on feet  Muscle weakness (generalized)  Other abnormalities of gait and mobility     Problem List Patient Active Problem List   Diagnosis Date Noted  . Pain in joint, ankle and foot 05/16/2018  . Gout 05/16/2018  . Gait  instability 01/03/2018  . Frequent bowel movements 11/02/2017  . Tremor of both hands   . Stuttering   . Essential hypertension   . Hyperlipidemia 08/13/2015  . Breast cancer of lower-outer quadrant of right female breast ([place]) 02/13/2015  . Vitamin D deficiency 02/06/2015  . Type 2 diabetes mellitus with diabetic foot deformity ([place]) 01/01/2014  . Chronic combined systolic and diastolic heart failure ([place]) 02/26/2013  . OBESITY 02/05/2009  . Asthma, intermittent 02/08/2008  . NEPHROLITHIASIS 01/26/2007  . DJD (degenerative joint disease), multiple sites 01/26/2007    Frazier Butt. 12/25/2018, 12:21 PM  Frazier Butt., PT   [facility] [address] [place], [place], [address] Phone: [phone]   Fax:  [phone]  Name: [name] MRN: [id number] Date of Birth: 1951-06-10

## 2018-12-27 ENCOUNTER — Encounter: Payer: Self-pay | Admitting: Podiatry

## 2018-12-27 ENCOUNTER — Ambulatory Visit (INDEPENDENT_AMBULATORY_CARE_PROVIDER_SITE_OTHER): Payer: Medicare Other | Admitting: Podiatry

## 2018-12-27 DIAGNOSIS — E1140 Type 2 diabetes mellitus with diabetic neuropathy, unspecified: Secondary | ICD-10-CM | POA: Diagnosis not present

## 2018-12-27 DIAGNOSIS — B351 Tinea unguium: Secondary | ICD-10-CM | POA: Diagnosis not present

## 2018-12-27 NOTE — Progress Notes (Signed)
Patient ID: [name], female   DOB: February 12, 1951, 68 y.o.   MRN: [id number] Complaint:  Visit Type: Patient returns to the office for preventative foot care services.  Patient states nails are painful walking and wearing her shoes.  She is unable to self treat.  Patient is diabetic.   Podiatric Exam: Vascular: dorsalis pedis and posterior tibial pulses are palpable bilateral. Capillary return is immediate. Temperature gradient is WNL. Skin turgor WNL  Sensorium: Diminished Semmes Weinstein monofilament test. Normal tactile sensation bilaterally. Nail Exam: Pt has thick disfigured discolored nails second and third toenails left foot. Ulcer Exam: There is no evidence of ulcer or pre-ulcerative changes or infection. Orthopedic Exam: Muscle tone and strength are WNL. No limitations in general ROM. No crepitus or effusions noted. Foot type and digits show no abnormalities. . Asymptomatic HAV B/L.  Pes planus noted. Skin: No Porokeratosis. No infection or ulcers  Diagnosis:  Onychomycosis  Diabetes  HAV  B/L  Pes planus  B/L  Treatment & Plan Procedures and Treatment: Debridement and grinding of long painful nails both feet.Marland Kitchen Marland KitchenRTC 3 months.  Patient likes her new diabetic shoes.  Return Visit-Office Procedure: Patient instructed to return to the office for a follow up visit for scheduled preventive foot care services.   Gardiner Barefoot DPM

## 2018-12-29 ENCOUNTER — Encounter: Payer: Self-pay | Admitting: Physical Therapy

## 2018-12-29 ENCOUNTER — Ambulatory Visit: Payer: Medicare Other | Admitting: Physical Therapy

## 2018-12-29 VITALS — BP 121/80

## 2018-12-29 DIAGNOSIS — M6281 Muscle weakness (generalized): Secondary | ICD-10-CM

## 2018-12-29 DIAGNOSIS — R2689 Other abnormalities of gait and mobility: Secondary | ICD-10-CM

## 2018-12-29 DIAGNOSIS — R2681 Unsteadiness on feet: Secondary | ICD-10-CM

## 2018-12-29 NOTE — Therapy (Signed)
[facility] [address] [place], [place], [address] Phone: [phone]   Fax:  [phone]  Physical Therapy Treatment  Patient Details  Name: [name] MRN: [id number] Date of Birth: 04-23-1951 Referring Provider (PT): Talbert Cage, MD   Encounter Date: 12/29/2018  PT End of Session - 12/29/18 1316    Visit Number  8    Number of Visits  17    Date for PT Re-Evaluation  02/26/19    Authorization Type  UHC Medicare & Medicaid    Authorization Time Period  Auth period 11/28/18-02/26/19    PT Start Time  1150    PT Stop Time  1230    PT Time Calculation (min)  40 min    Equipment Utilized During Treatment  Gait belt    Activity Tolerance  Patient tolerated treatment well   Reports slight decrease in pain to 5/10 at end of session   Behavior During Therapy  [facility] for tasks assessed/performed       Past Medical History:  Diagnosis Date  . Anemia   . Arthritis   . Asthma   . Breast cancer of lower-outer quadrant of right female breast ([place]) 02/13/2015  . Cataract   . CHF, acute ([place]) 05/03/2012  . Depression   . Diabetes mellitus   . Eczema   . Fatty liver    Fatty infiltration of liver noted on 03/2012 CT scan  . Fibromyalgia   . GERD (gastroesophageal reflux disease)   . Glaucoma   . Heart disease   . History of kidney stones    w/ hx of hydronephrosis - followed by [facility]  . HIV nonspecific serology    2006: indeterminate HIV blood test, seen by ID, felt secondary to cross reacting antibodies with no further workup felt necessary at that time   . Hypertension   . Obesity   . Personal history of radiation therapy 2016   right  . Radiation 05/07/15-06/23/15   Right Breast    Past Surgical History:  Procedure Laterality Date  . BREAST BIOPSY Left 02/10/2015   malignant   . BREAST LUMPECTOMY Right 03/21/2015  . CHOLECYSTECTOMY  2003  . CYSTOSCOPY W/ LITHOLAPAXY / EHL    . JOINT  REPLACEMENT     bilateral knee replacement  . LEFT AND RIGHT HEART CATHETERIZATION WITH CORONARY ANGIOGRAM N/A 05/02/2012   Procedure: LEFT AND RIGHT HEART CATHETERIZATION WITH CORONARY ANGIOGRAM;  Surgeon: Burnell Blanks, MD;  Location: [facility] CATH LAB;  Service: Cardiovascular;  Laterality: N/A;  . RADIOACTIVE SEED GUIDED PARTIAL MASTECTOMY WITH AXILLARY SENTINEL LYMPH NODE BIOPSY Right 03/21/2015   Procedure: RIGHT  PARTIAL MASTECTOMY WITH RADIOACTIVE SEED LOCALIZATION RIGHT  AXILLARY SENTINEL LYMPH NODE BIOPSY;  Surgeon: Fanny Skates, MD;  Location: [facility];  Service: General;  Laterality: Right;  . REPLACEMENT TOTAL KNEE BILATERAL  2005 &2006  . VASCULAR SURGERY Right 03/15/2013   Ultrasound guided sclerotherapy    Vitals:   12/29/18 1300 12/29/18 1308  BP: 130/87 121/80    Subjective Assessment - 12/29/18 1155    Subjective  Denies falls or changes.    Pertinent History  Breast cancer 2015:  Neuropathy; DM type 2, arthritis      Patient Stated Goals  Pt's goal for therapy is to get built back up to do my normal things.    Currently in Pain?  Yes    Pain Score  7     Pain Location  Knee  Pain Orientation  Right;Left    Pain Descriptors / Indicators  Aching    Pain Type  Chronic pain    Pain Onset  More than a month ago    Aggravating Factors   aggravated by cold    Pain Relieving Factors  medication, hot showers    Multiple Pain Sites  No          [facility] Adult PT Treatment/Exercise - 12/29/18 0001      Transfers   Transfers  Sit to Stand;Stand to Sit    Sit to Stand  5: Supervision;With upper extremity assist;From chair/3-in-1;From bed    Stand to Sit  5: Supervision;With upper extremity assist;To chair/3-in-1;To bed    Number of Reps  10 reps      Knee/Hip Exercises: Standing   Heel Raises  Both;10 reps    SLS  taps and double taps to cones at counter for support    Other Standing Knee Exercises  Lateral weightshifting 10 reps, UEs supported at  counter    Other Standing Knee Exercises  Side stepping along counter, 6 reps R and L; forward<>backward walking at counter.  Encouraged increased BOS       Knee/Hip Exercises: Seated   Long Arc Quad  Both;10 reps    Other Seated Knee/Hip Exercises  heel/toe raises X 20    Marching  Both;10 reps         PT Short Term Goals - 12/29/18 1311      PT SHORT TERM GOAL #1   Title  Pt will be independent with HEP for improved balance, gait, strength.  TARGET 12/29/18    Time  5    Period  Weeks    Status  Achieved      PT SHORT TERM GOAL #2   Title  Pt will perform at least 4 of 5 reps of sit<>stand with UE support, no posterior LOB, for improved safety and efficiency with transfers.    Time  5    Period  Weeks    Status  Achieved      PT SHORT TERM GOAL #3   Title  Pt will improve Berg Balance score to at least 23/56 for decreased fall risk.    Baseline  24/56 12/25/2018    Time  5    Period  Weeks    Status  Achieved      PT SHORT TERM GOAL #4   Title  Pt will improve TUG score to less than or equal to 20 seconds for decreased fall risk.    Baseline  15.81 sec 12/25/2018    Time  5    Period  Weeks    Status  Achieved        PT Long Term Goals - 11/28/18 1501      PT LONG TERM GOAL #1   Title  Pt will verbalize understanding of fall prevention in home environment.    Time  9    Period  Weeks    Status  New    Target Date  01/27/19      PT LONG TERM GOAL #2   Title  Pt will improve gait velocity to at least 2.3 ft/sec for improved gait efficiency and safety.    Time  9    Period  Weeks    Status  New    Target Date  01/27/19      PT LONG TERM GOAL #3   Title  Pt will improve [employer]  score to at least 33/56 for decreased fall risk.    Time  9    Period  Weeks    Status  New    Target Date  01/27/19      PT LONG TERM GOAL #4   Title  Pt will improve TUG score to less than or equal to 15 seconds for decreased fall risk    Time  9    Period  Weeks     Status  New    Target Date  01/27/19            Plan - 12/29/18 1406    Clinical Impression Statement  Pt met STG#1 this visit therefore has met all STG's.  Pt is I with HEP and reports doing her walking at home 1x/day and up to 4 laps in her house.  Pt continues to have instances in instability during standing. Balance activities continue to focus on ankle, hip, step strategies due to pt having several episodes of quick LOB, requiring external support to regain balance.  Pt tends to have LOB with increased standing time but did not have significant change in BP upon standing.  Continue PT per POC.    Rehab Potential  Good    PT Frequency  2x / week    PT Duration  8 weeks    PT Treatment/Interventions  ADLs/Self Care Home Management;Therapeutic exercise;Patient/family education;Therapeutic activities;Functional mobility training;Gait training;DME Instruction;Balance training;Neuromuscular re-education    PT Next Visit Plan  Check BP sitting then after 3 minutes standing if able; add hip/ankle strategy work at counter, stagger stance weightshifting and terminal knee extension to HEP if pt safe to do so.    Consulted and Agree with Plan of Care  Patient       Patient will benefit from skilled therapeutic intervention in order to improve the following deficits and impairments:  Abnormal gait, Decreased balance, Decreased strength, Decreased mobility, Difficulty walking, Postural dysfunction  Visit Diagnosis: Unsteadiness on feet  Muscle weakness (generalized)  Other abnormalities of gait and mobility  Gait instability     Problem List Patient Active Problem List   Diagnosis Date Noted  . Pain in joint, ankle and foot 05/16/2018  . Gout 05/16/2018  . Gait instability 01/03/2018  . Frequent bowel movements 11/02/2017  . Tremor of both hands   . Stuttering   . Essential hypertension   . Hyperlipidemia 08/13/2015  . Breast cancer of lower-outer quadrant of right female breast  ([place]) 02/13/2015  . Vitamin D deficiency 02/06/2015  . Type 2 diabetes mellitus with diabetic foot deformity ([place]) 01/01/2014  . Chronic combined systolic and diastolic heart failure ([place]) 02/26/2013  . OBESITY 02/05/2009  . Asthma, intermittent 02/08/2008  . NEPHROLITHIASIS 01/26/2007  . DJD (degenerative joint disease), multiple sites 01/26/2007   Narda Bonds, PTA [facility] 12/29/18 2:12 PM Phone: [phone] Fax: [facility] [address] [place], [place], [address] Phone: [phone]   Fax:  [phone]  Name: [name] MRN: [id number] Date of Birth: 09-06-1951

## 2019-01-02 ENCOUNTER — Encounter: Payer: Self-pay | Admitting: Physical Therapy

## 2019-01-02 ENCOUNTER — Ambulatory Visit: Payer: Medicare Other | Attending: Family Medicine | Admitting: Physical Therapy

## 2019-01-02 ENCOUNTER — Telehealth: Payer: Self-pay

## 2019-01-02 VITALS — BP 136/95 | HR 79

## 2019-01-02 DIAGNOSIS — M150 Primary generalized (osteo)arthritis: Principal | ICD-10-CM

## 2019-01-02 DIAGNOSIS — M159 Polyosteoarthritis, unspecified: Secondary | ICD-10-CM

## 2019-01-02 DIAGNOSIS — R2689 Other abnormalities of gait and mobility: Secondary | ICD-10-CM | POA: Diagnosis not present

## 2019-01-02 DIAGNOSIS — M6281 Muscle weakness (generalized): Secondary | ICD-10-CM | POA: Diagnosis not present

## 2019-01-02 DIAGNOSIS — R2681 Unsteadiness on feet: Secondary | ICD-10-CM

## 2019-01-02 NOTE — Therapy (Signed)
[facility] [address] [place], [place], [address] Phone: [phone]   Fax:  [phone]  Physical Therapy Treatment  Patient Details  Name: [name] MRN: [id number] Date of Birth: 1951-07-24 Referring Provider (PT): Talbert Cage, MD   Encounter Date: 01/02/2019  PT End of Session - 01/02/19 1139    Visit Number  9    Number of Visits  17    Date for PT Re-Evaluation  02/26/19    Authorization Type  UHC Medicare & Medicaid    Authorization Time Period  Auth period 11/28/18-02/26/19    PT Start Time  1020    PT Stop Time  1100    PT Time Calculation (min)  40 min    Equipment Utilized During Treatment  Gait belt    Activity Tolerance  Patient tolerated treatment well    Behavior During Therapy  [facility] for tasks assessed/performed   Multiple episodes of uncontrolled LOB with standing activities-PT able to help her regain balance      Past Medical History:  Diagnosis Date  . Anemia   . Arthritis   . Asthma   . Breast cancer of lower-outer quadrant of right female breast ([place]) 02/13/2015  . Cataract   . CHF, acute ([place]) 05/03/2012  . Depression   . Diabetes mellitus   . Eczema   . Fatty liver    Fatty infiltration of liver noted on 03/2012 CT scan  . Fibromyalgia   . GERD (gastroesophageal reflux disease)   . Glaucoma   . Heart disease   . History of kidney stones    w/ hx of hydronephrosis - followed by [facility]  . HIV nonspecific serology    2006: indeterminate HIV blood test, seen by ID, felt secondary to cross reacting antibodies with no further workup felt necessary at that time   . Hypertension   . Obesity   . Personal history of radiation therapy 2016   right  . Radiation 05/07/15-06/23/15   Right Breast    Past Surgical History:  Procedure Laterality Date  . BREAST BIOPSY Left 02/10/2015   malignant   . BREAST LUMPECTOMY Right 03/21/2015  . CHOLECYSTECTOMY  2003  . CYSTOSCOPY  W/ LITHOLAPAXY / EHL    . JOINT REPLACEMENT     bilateral knee replacement  . LEFT AND RIGHT HEART CATHETERIZATION WITH CORONARY ANGIOGRAM N/A 05/02/2012   Procedure: LEFT AND RIGHT HEART CATHETERIZATION WITH CORONARY ANGIOGRAM;  Surgeon: Burnell Blanks, MD;  Location: [facility] CATH LAB;  Service: Cardiovascular;  Laterality: N/A;  . RADIOACTIVE SEED GUIDED PARTIAL MASTECTOMY WITH AXILLARY SENTINEL LYMPH NODE BIOPSY Right 03/21/2015   Procedure: RIGHT  PARTIAL MASTECTOMY WITH RADIOACTIVE SEED LOCALIZATION RIGHT  AXILLARY SENTINEL LYMPH NODE BIOPSY;  Surgeon: Fanny Skates, MD;  Location: [facility];  Service: General;  Laterality: Right;  . REPLACEMENT TOTAL KNEE BILATERAL  2005 &2006  . VASCULAR SURGERY Right 03/15/2013   Ultrasound guided sclerotherapy    Vitals:   01/02/19 1023 01/02/19 1027  BP: 126/84 (!) 136/95  Pulse: 74 79    Subjective Assessment - 01/02/19 1023    Subjective  No falls, but did have several episodes where my legs feel like they might give way.    Pertinent History  Breast cancer 2015:  Neuropathy; DM type 2, arthritis      Patient Stated Goals  Pt's goal for therapy is to get built back up to do my normal things.    Currently  in Pain?  Yes    Pain Score  7     Pain Location  Knee    Pain Orientation  Right;Left    Pain Descriptors / Indicators  Aching    Pain Type  Chronic pain    Pain Onset  More than a month ago    Pain Frequency  Constant    Aggravating Factors   aggravated by cold    Pain Relieving Factors  rub down, pain medication                       [facility] Adult PT Treatment/Exercise - 01/02/19 0001      Transfers   Transfers  Sit to Stand;Stand to Sit    Sit to Stand  5: Supervision;With upper extremity assist;From chair/3-in-1;From bed    Stand to Sit  5: Supervision;With upper extremity assist;To chair/3-in-1;To bed    Number of Reps  Other reps (comment)   8 reps throughout session   Comments  Pt reports  sit<>stand transfers are getting easier for her at home.      Ambulation/Gait   Ambulation/Gait  Yes    Ambulation/Gait Assistance  4: Min guard    Ambulation Distance (Feet)  230 Feet   150   Assistive device  Rollator    Gait Pattern  Step-through pattern;Decreased stride length;Trunk flexed;Poor foot clearance - left    Ambulation Surface  Level;Indoor      Self-Care   Self-Care  Other Self-Care Comments    Other Self-Care Comments   Checked BP in sitting and standing after 3 minutes (see vitals).  Blood pressure does not lower in standing, so orthostatic changes do not appear to be affecting standing balance.  Pt explains that it is not pain or fatigue, but it is almost knee buckling that leads to the unconcontrolled LOB situation.  She reports she has not seen a doctor about her knee pain/ instability in probably 10 years.  She agrees with PT that she may benefit from follow-up with MD about knee pain/instability, because these unstable episodes are limiting progression with standing exercises in therapy, with safe standing activities at home, and they are putting her at a risk for falls.  (When pt has these uncontrolled LOB-her knees appear to give way, she has increased trunk/body/leg sway and lets go of UE support, then reaches back out for UE support).  PT keeps close guard with gait belt for all standing exercises.  PT explains to pt again, that she needs to use rollator at all times at home for safety.      Knee/Hip Exercises: Standing   Other Standing Knee Exercises  Gentle knee bends then into terminal knee extension x 10 reps (1 episode of uncontrolled LOB).  Then progressed to standing glut sets, 2 sets x 10 reps.  Cues to use quad and glut sets to activate leg muscles to avoid as many uncontrolled LOB episodes.          Balance Exercises - 01/02/19 1040      Balance Exercises: Standing   Wall Bumps  Hip   1 episode of uncontrolled LOB-near buckling of knees   Wall  Bumps-Hips  Eyes opened;Anterior/posterior;10 reps   2 sets (hip strategy) performed at parallel bars   Stepping Strategy  Lateral;UE support;10 reps   1 episode of uncontrolled LOB, near buckling L knee   Heel Raises Limitations  Heel raises, 2 sets x 10 for ankle strategy    Toe  Raise Limitations  Toe raises 2 sets x 10 for ankle strategy (1 episode at end of 2nd set-unexplained LOB, needs therapist assist to regain.)        PT Education - 01/02/19 1137    Education provided  Yes    Education Details  Progression to 2-3 sets of 10 reps for seated exercises; discussed with pt about knee instability, pain limiting standing and walking progression with PT and need to follow up with MD regarding knees    Person(s) Educated  Patient    Methods  Explanation    Comprehension  Verbalized understanding       PT Short Term Goals - 12/29/18 1311      PT SHORT TERM GOAL #1   Title  Pt will be independent with HEP for improved balance, gait, strength.  TARGET 12/29/18    Time  5    Period  Weeks    Status  Achieved      PT SHORT TERM GOAL #2   Title  Pt will perform at least 4 of 5 reps of sit<>stand with UE support, no posterior LOB, for improved safety and efficiency with transfers.    Time  5    Period  Weeks    Status  Achieved      PT SHORT TERM GOAL #3   Title  Pt will improve Berg Balance score to at least 23/56 for decreased fall risk.    Baseline  24/56 12/25/2018    Time  5    Period  Weeks    Status  Achieved      PT SHORT TERM GOAL #4   Title  Pt will improve TUG score to less than or equal to 20 seconds for decreased fall risk.    Baseline  15.81 sec 12/25/2018    Time  5    Period  Weeks    Status  Achieved        PT Long Term Goals - 11/28/18 1501      PT LONG TERM GOAL #1   Title  Pt will verbalize understanding of fall prevention in home environment.    Time  9    Period  Weeks    Status  New    Target Date  01/27/19      PT LONG TERM GOAL #2   Title  Pt  will improve gait velocity to at least 2.3 ft/sec for improved gait efficiency and safety.    Time  9    Period  Weeks    Status  New    Target Date  01/27/19      PT LONG TERM GOAL #3   Title  Pt will improve Berg Balance score to at least 33/56 for decreased fall risk.    Time  9    Period  Weeks    Status  New    Target Date  01/27/19      PT LONG TERM GOAL #4   Title  Pt will improve TUG score to less than or equal to 15 seconds for decreased fall risk    Time  9    Period  Weeks    Status  New    Target Date  01/27/19            Plan - 01/02/19 1140    Clinical Impression Statement  Looked at blood pressure today in sitting and standing, with no decrease in standing BP after 3 minutes, so blood pressure changes  likely  not a reason for pt's moments of uncontrolled instability in standing.  Pt has at least 4 episodes during short (2-3 minute) bouts of standing during PT today, requiring therapist assistance to regain balance.  Discussed this as a limiting factor for standing activities in therpapy and at home, and pt feels that her knees migth be contributing to knee instability; she agrees to try to follow up with orthopedic MD regarding knee pain/knee instability.    Rehab Potential  Good    PT Frequency  2x / week    PT Duration  8 weeks    PT Treatment/Interventions  ADLs/Self Care Home Management;Therapeutic exercise;Patient/family education;Therapeutic activities;Functional mobility training;Gait training;DME Instruction;Balance training;Neuromuscular re-education    PT Next Visit Plan  Pt to follow up with primary doctor/orthopedic doctor about knee pain/knee instaiblity; continue to work on hip/ankle strategy, step strategy work at counter, terminal knee extension and add to HEP when safe to do so.    Consulted and Agree with Plan of Care  Patient     PLAN:  WILL NEED 10th VISIT PROGRESS NOTE NEXT VISIT.  Patient will benefit from skilled therapeutic intervention in  order to improve the following deficits and impairments:  Abnormal gait, Decreased balance, Decreased strength, Decreased mobility, Difficulty walking, Postural dysfunction  Visit Diagnosis: Unsteadiness on feet  Muscle weakness (generalized)  Other abnormalities of gait and mobility     Problem List Patient Active Problem List   Diagnosis Date Noted  . Pain in joint, ankle and foot 05/16/2018  . Gout 05/16/2018  . Gait instability 01/03/2018  . Frequent bowel movements 11/02/2017  . Tremor of both hands   . Stuttering   . Essential hypertension   . Hyperlipidemia 08/13/2015  . Breast cancer of lower-outer quadrant of right female breast ([place]) 02/13/2015  . Vitamin D deficiency 02/06/2015  . Type 2 diabetes mellitus with diabetic foot deformity ([place]) 01/01/2014  . Chronic combined systolic and diastolic heart failure ([place]) 02/26/2013  . OBESITY 02/05/2009  . Asthma, intermittent 02/08/2008  . NEPHROLITHIASIS 01/26/2007  . DJD (degenerative joint disease), multiple sites 01/26/2007    Ger Ringenberg W. 01/02/2019, 11:46 AM Frazier Butt., PT [facility] [address] [place], [place], [address] Phone: [phone]   Fax:  [phone]  Name: [name] MRN: [id number] Date of Birth: Oct 12, 1951

## 2019-01-02 NOTE — Telephone Encounter (Signed)
Patient wants referral to specialist for knee pain.   Call back is [phone]  Danley Danker, RN [facility] [place])

## 2019-01-02 NOTE — Patient Instructions (Signed)
For your sitting exercises, work up to 2-3 sets of 10 repetitions.  Take a short break between sets.

## 2019-01-03 NOTE — Assessment & Plan Note (Signed)
Patient calls with worsening knee pain.  Physical Therapy noticed significant instability in her gait they felt was related to her knees.  Had replacements in 2005.  Will refer to ortho.

## 2019-01-04 ENCOUNTER — Encounter: Payer: Self-pay | Admitting: Physical Therapy

## 2019-01-04 ENCOUNTER — Ambulatory Visit: Payer: Medicare Other | Admitting: Physical Therapy

## 2019-01-04 DIAGNOSIS — M6281 Muscle weakness (generalized): Secondary | ICD-10-CM | POA: Diagnosis not present

## 2019-01-04 DIAGNOSIS — R2689 Other abnormalities of gait and mobility: Secondary | ICD-10-CM | POA: Diagnosis not present

## 2019-01-04 DIAGNOSIS — R2681 Unsteadiness on feet: Secondary | ICD-10-CM | POA: Diagnosis not present

## 2019-01-04 NOTE — Therapy (Signed)
[facility] [address] [place], [place], [address] Phone: [phone]   Fax:  [phone]  Physical Therapy Treatment  Patient Details  Name: [name] MRN: [id number] Date of Birth: [birth year] Referring Provider (PT): Talbert Cage, MD   Encounter Date: 01/04/2019  PT End of Session - 01/04/19 1416    Visit Number  10    Number of Visits  17    Date for PT Re-Evaluation  02/26/19    Authorization Type  [employer]HC Medicare & Medicaid    Authorization Time Period  Auth period 11/28/18-02/26/19    PT Start Time  0952    PT Stop Time  1038    PT Time Calculation (min)  46 min    Equipment Utilized During Treatment  Gait belt    Activity Tolerance  Patient tolerated treatment well    Behavior During Therapy  [facility] for tasks assessed/performed   1 episode of uncontrolled LOB with standing activities-PT able to help her regain balance      Past Medical History:  Diagnosis Date  . Anemia   . Arthritis   . Asthma   . Breast cancer of lower-outer quadrant of right female breast ([place]) 02/13/2015  . Cataract   . CHF, acute ([place]) 05/03/2012  . Depression   . Diabetes mellitus   . Eczema   . Fatty liver    Fatty infiltration of liver noted on 03/2012 CT scan  . Fibromyalgia   . GERD (gastroesophageal reflux disease)   . Glaucoma   . Heart disease   . History of kidney stones    w/ hx of hydronephrosis - followed by [facility]  . HIV nonspecific serology    2006: indeterminate HIV blood test, seen by ID, felt secondary to cross reacting antibodies with no further workup felt necessary at that time   . Hypertension   . Obesity   . Personal history of radiation therapy 2016   right  . Radiation 05/07/15-06/23/15   Right Breast    Past Surgical History:  Procedure Laterality Date  . BREAST BIOPSY Left 02/10/2015   malignant   . BREAST LUMPECTOMY Right 03/21/2015  . CHOLECYSTECTOMY  2003  . CYSTOSCOPY W/  LITHOLAPAXY / EHL    . JOINT REPLACEMENT     bilateral knee replacement  . LEFT AND RIGHT HEART CATHETERIZATION WITH CORONARY ANGIOGRAM N/A 05/02/2012   Procedure: LEFT AND RIGHT HEART CATHETERIZATION WITH CORONARY ANGIOGRAM;  Surgeon: Burnell Blanks, MD;  Location: [facility] CATH LAB;  Service: Cardiovascular;  Laterality: N/A;  . RADIOACTIVE SEED GUIDED PARTIAL MASTECTOMY WITH AXILLARY SENTINEL LYMPH NODE BIOPSY Right 03/21/2015   Procedure: RIGHT  PARTIAL MASTECTOMY WITH RADIOACTIVE SEED LOCALIZATION RIGHT  AXILLARY SENTINEL LYMPH NODE BIOPSY;  Surgeon: Fanny Skates, MD;  Location: [facility];  Service: General;  Laterality: Right;  . REPLACEMENT TOTAL KNEE BILATERAL  2005 &2006  . VASCULAR SURGERY Right 03/15/2013   Ultrasound guided sclerotherapy    There were no vitals filed for this visit.  Subjective Assessment - 01/04/19 0955    Subjective  Not feeling too good today, because of all the rain.  Did call and I'm waiting now for the orthopedic doctor to call me to schedule.    Pertinent History  Breast cancer 2015:  Neuropathy; DM type 2, arthritis      Patient Stated Goals  Pt's goal for therapy is to get built back up to do my normal things.    Currently  in Pain?  Yes    Pain Score  9     Pain Location  Knee    Pain Orientation  Right;Left    Pain Descriptors / Indicators  Aching    Pain Type  Chronic pain    Pain Onset  More than a month ago    Aggravating Factors   rain, cold    Pain Relieving Factors  pain medication, cream, warm shower                       [facility] Adult PT Treatment/Exercise - 01/04/19 0001      Transfers   Transfers  Sit to Stand;Stand to Sit    Sit to Stand  5: Supervision;With upper extremity assist;From chair/3-in-1;From bed    Sit to Stand Details (indicate cue type and reason)  Pt unsteady upon standing, cues for glut activation and upright posture to avoid increased trunk and hip sway upon standing.    Stand to Sit   5: Supervision;With upper extremity assist;To chair/3-in-1;To bed    Number of Reps  Other reps (comment)   8 reps throughout session     Ambulation/Gait   Ambulation/Gait  Yes    Ambulation/Gait Assistance  4: Min guard    Ambulation/Gait Assistance Details  Cues for upright posture    Ambulation Distance (Feet)  230 Feet   100 ft x 2   Assistive device  Rollator    Gait Pattern  Step-through pattern;Decreased stride length;Trunk flexed;Poor foot clearance - left    Ambulation Surface  Level;Indoor      Self-Care   Self-Care  Other Self-Care Comments    Other Self-Care Comments   Discussed option for possibility of aquatic therapy, as pt's knees are continuing to bother her with pain and stability.  Discussed options for PT /aquatic therapy at [facility], including benefits and options.  Pt agreeable to trying to ask family if they can provide assistance to get her there.      Exercises   Exercises  Other Exercises    Other Exercises   Trunk and core stability:  abdominal activation with alternating UE lifts, then bilateral UE lifts x 10, alternating leg lift (marches) x 10 reps.  Opposite UE/leg lifts x 5 reps each side, cues for abdominal activation.      Knee/Hip Exercises: Aerobic   Nustep  4 extremities, Level 4, 5 min., for warm-up, flexibility          Balance Exercises - 01/04/19 1024      Balance Exercises: Standing   Standing Eyes Opened  Wide (BOA);Head turns;Solid surface;5 reps   Head turns (at rollator, 2 sets)   Standing Eyes Closed  Wide (BOA);Solid surface;2 reps;10 secs   at locked rollator, 2 sets   Stepping Strategy  Anterior;Posterior   Forward/back step taps x 10 reps each leg   Partial Tandem Stance  Eyes open;Upper extremity support 2;Eyes closed   Head turns, nods x 5, then EC x 10 sec at rollator;   Heel Raises Limitations  heel raises x 10    Toe Raise Limitations  heel raises x 10        Pt has one LOB with standing at locked rollator with head  motions-therapist assist to regain balance.  PT Education - 01/04/19 1416    Education provided  Yes    Education Details  possibility of aquatic therapy-pt is to ask family about getting her to [facility]) Educated  Patient    Methods  Explanation    Comprehension  Verbalized understanding       PT Short Term Goals - 12/29/18 1311      PT SHORT TERM GOAL #1   Title  Pt will be independent with HEP for improved balance, gait, strength.  TARGET 12/29/18    Time  5    Period  Weeks    Status  Achieved      PT SHORT TERM GOAL #2   Title  Pt will perform at least 4 of 5 reps of sit<>stand with UE support, no posterior LOB, for improved safety and efficiency with transfers.    Time  5    Period  Weeks    Status  Achieved      PT SHORT TERM GOAL #3   Title  Pt will improve Berg Balance score to at least 23/56 for decreased fall risk.    Baseline  24/56 12/25/2018    Time  5    Period  Weeks    Status  Achieved      PT SHORT TERM GOAL #4   Title  Pt will improve TUG score to less than or equal to 20 seconds for decreased fall risk.    Baseline  15.81 sec 12/25/2018    Time  5    Period  Weeks    Status  Achieved        PT Long Term Goals - 11/28/18 1501      PT LONG TERM GOAL #1   Title  Pt will verbalize understanding of fall prevention in home environment.    Time  9    Period  Weeks    Status  New    Target Date  01/27/19      PT LONG TERM GOAL #2   Title  Pt will improve gait velocity to at least 2.3 ft/sec for improved gait efficiency and safety.    Time  9    Period  Weeks    Status  New    Target Date  01/27/19      PT LONG TERM GOAL #3   Title  Pt will improve Berg Balance score to at least 33/56 for decreased fall risk.    Time  9    Period  Weeks    Status  New    Target Date  01/27/19      PT LONG TERM GOAL #4   Title  Pt will improve TUG score to less than or equal to 15 seconds for decreased fall risk    Time  9    Period  Weeks    Status   New    Target Date  01/27/19            Plan - 01/04/19 1419    Clinical Impression Statement  10th visit progress note, covering dates 11/28/18-01/04/2019:  Berg Balance score improved from 13/56 to 24/56, TUG score imporved from 25.28 sec to 15.81 sec.  Pt has met all STGs.  Pt is continueing to report pain in bilateral knees and is experienceing instability/knees giving way in standing, even with UE support.  She has made contact with her PCP, for a referral to orthopedic MD.  Pt is improving with mobility and balance, and she will continue to benefit from further skilled PT to improve functional mobility and decrease fall risk.    Rehab Potential  Good    PT Frequency  2x / week  PT Duration  8 weeks    PT Treatment/Interventions  ADLs/Self Care Home Management;Therapeutic exercise;Patient/family education;Therapeutic activities;Functional mobility training;Gait training;DME Instruction;Balance training;Neuromuscular re-education    PT Next Visit Plan  Continue to work on standing tolerance with decreased UE support, continue to work on hip/ankle strategy, step strategy work at [employer], terminal knee extension and add to HEP when safe to do so.    Recommended Other Services  Ask [facility], about aquatic therapy possibility    Consulted and Agree with Plan of Care  Patient       Patient will benefit from skilled therapeutic intervention in order to improve the following deficits and impairments:  Abnormal gait, Decreased balance, Decreased strength, Decreased mobility, Difficulty walking, Postural dysfunction  Visit Diagnosis: Unsteadiness on feet  Muscle weakness (generalized)  Other abnormalities of gait and mobility     Problem List Patient Active Problem List   Diagnosis Date Noted  . Pain in joint, ankle and foot 05/16/2018  . Gout 05/16/2018  . Gait instability 01/03/2018  . Frequent bowel movements 11/02/2017  . Tremor of both hands   . Stuttering   .  Essential hypertension   . Hyperlipidemia 08/13/2015  . Breast cancer of lower-outer quadrant of right female breast ([place]) 02/13/2015  . Vitamin D deficiency 02/06/2015  . Type 2 diabetes mellitus with diabetic foot deformity ([place]) 01/01/2014  . Chronic combined systolic and diastolic heart failure ([place]) 02/26/2013  . OBESITY 02/05/2009  . Asthma, intermittent 02/08/2008  . NEPHROLITHIASIS 01/26/2007  . DJD (degenerative joint disease), multiple sites 01/26/2007    Frazier Butt. 01/04/2019, 2:25 PM  Frazier Butt., PT   [facility] [address] [place], [place], [address] Phone: [phone]   Fax:  [phone]  Name: [name] MRN: [id number] Date of Birth: [birth year]

## 2019-01-06 ENCOUNTER — Other Ambulatory Visit: Payer: Self-pay | Admitting: Family Medicine

## 2019-01-09 ENCOUNTER — Encounter: Payer: Self-pay | Admitting: Physical Therapy

## 2019-01-09 ENCOUNTER — Ambulatory Visit: Payer: Medicare Other | Admitting: Physical Therapy

## 2019-01-09 DIAGNOSIS — M6281 Muscle weakness (generalized): Secondary | ICD-10-CM | POA: Diagnosis not present

## 2019-01-09 DIAGNOSIS — R2681 Unsteadiness on feet: Secondary | ICD-10-CM

## 2019-01-09 DIAGNOSIS — R2689 Other abnormalities of gait and mobility: Secondary | ICD-10-CM

## 2019-01-09 NOTE — Therapy (Signed)
[facility] [address] [place], [place], [address] Phone: [phone]   Fax:  [phone]  Physical Therapy Treatment  Patient Details  Name: [name] MRN: [id number] Date of Birth: 24-Jun-1951 Referring Provider (PT): Talbert Cage, MD   Encounter Date: 01/09/2019  PT End of Session - 01/09/19 1023    Visit Number  11    Number of Visits  17    Date for PT Re-Evaluation  02/26/19    Authorization Type  UHC Medicare & Medicaid    Authorization Time Period  Auth period 11/28/18-02/26/19    PT Start Time  1017    PT Stop Time  1059    PT Time Calculation (min)  42 min    Equipment Utilized During Treatment  Gait belt    Activity Tolerance  Patient tolerated treatment well;No increased pain;Patient limited by pain    Behavior During Therapy  [facility] for tasks assessed/performed       Past Medical History:  Diagnosis Date  . Anemia   . Arthritis   . Asthma   . Breast cancer of lower-outer quadrant of right female breast ([place]) 02/13/2015  . Cataract   . CHF, acute ([place]) 05/03/2012  . Depression   . Diabetes mellitus   . Eczema   . Fatty liver    Fatty infiltration of liver noted on 03/2012 CT scan  . Fibromyalgia   . GERD (gastroesophageal reflux disease)   . Glaucoma   . Heart disease   . History of kidney stones    w/ hx of hydronephrosis - followed by [facility]  . HIV nonspecific serology    2006: indeterminate HIV blood test, seen by ID, felt secondary to cross reacting antibodies with no further workup felt necessary at that time   . Hypertension   . Obesity   . Personal history of radiation therapy 2016   right  . Radiation 05/07/15-06/23/15   Right Breast    Past Surgical History:  Procedure Laterality Date  . BREAST BIOPSY Left 02/10/2015   malignant   . BREAST LUMPECTOMY Right 03/21/2015  . CHOLECYSTECTOMY  2003  . CYSTOSCOPY W/ LITHOLAPAXY / EHL    . JOINT REPLACEMENT      bilateral knee replacement  . LEFT AND RIGHT HEART CATHETERIZATION WITH CORONARY ANGIOGRAM N/A 05/02/2012   Procedure: LEFT AND RIGHT HEART CATHETERIZATION WITH CORONARY ANGIOGRAM;  Surgeon: Burnell Blanks, MD;  Location: [facility] CATH LAB;  Service: Cardiovascular;  Laterality: N/A;  . RADIOACTIVE SEED GUIDED PARTIAL MASTECTOMY WITH AXILLARY SENTINEL LYMPH NODE BIOPSY Right 03/21/2015   Procedure: RIGHT  PARTIAL MASTECTOMY WITH RADIOACTIVE SEED LOCALIZATION RIGHT  AXILLARY SENTINEL LYMPH NODE BIOPSY;  Surgeon: Fanny Skates, MD;  Location: [facility];  Service: General;  Laterality: Right;  . REPLACEMENT TOTAL KNEE BILATERAL  2005 &2006  . VASCULAR SURGERY Right 03/15/2013   Ultrasound guided sclerotherapy    There were no vitals filed for this visit.  Subjective Assessment - 01/09/19 1021    Subjective  Reports not a good day today. "Hurting all over". No falls. Has not heard from the orthopedic doctor as yet.     Pertinent History  Breast cancer 2015:  Neuropathy; DM type 2, arthritis      Patient Stated Goals  Pt's goal for therapy is to get built back up to do my normal things.    Currently in Pain?  Yes    Pain Score  9  Pain Location  Generalized   "all over"   Pain Descriptors / Indicators  Aching;Sharp   sharp behind her knees   Pain Type  Chronic pain    Pain Onset  More than a month ago    Pain Frequency  Constant    Aggravating Factors   rain, cold weather    Pain Relieving Factors  pain medication, creame, warm shower            [facility] Adult PT Treatment/Exercise - 01/09/19 1034      Transfers   Transfers  Sit to Stand;Stand to Sit    Sit to Stand  5: Supervision;4: Min guard;With upper extremity assist;From bed;From chair/3-in-1    Stand to Sit  5: Supervision;4: Min assist;With upper extremity assist;To chair/3-in-1;To bed    Comments  post loss of balance x 2 with standing. 1st in lobby when taking jacket off with loss of balance into chair.  second in lobby at [facility] table with turning to sit. both needing min assist to sit safety.       Ambulation/Gait   Ambulation/Gait  Yes    Ambulation/Gait Assistance  4: Min guard    Ambulation/Gait Assistance Details  pt with multiple stop with gait to "rest" by propping forearm on rollator handle. cues on decr safety with this. cues to keep rollator closer with gait as well.     Ambulation Distance (Feet)  80 Feet   x2, 70 x2   Assistive device  Rollator    Gait Pattern  Step-through pattern;Decreased stride length;Trunk flexed;Poor foot clearance - left    Ambulation Surface  Level;Indoor      Exercises   Exercises  Other Exercises    Other Exercises   hooklying on mat table: abdominal bracing for 5 sec's x 10 reps, then with abd bracing pt performed bridges for 10 reps; with abd bracing:  alternating UE raises, then alternating marching, then alternating combo contralateral UE/LE raises x 10 reps each with cues to maintain abdominal bracing;                       Knee/Hip Exercises: Aerobic   Nustep  Level 4 with UE/LE's with goal >/= 40 steps per minute for 5 minutes        Knee/Hip Exercises: Seated   Long Arc Quad  Strengthening;Both;1 set;10 reps;Weights;Limitations    Long Arc Quad Weight  3 lbs.    Long [employer] Limitations  cues for slow, controlled movements    Marching  AAROM;Strengthening;Both;1 set;10 reps;Weights;Limitations    Marching Limitations  cues for posture and correct ex form/technique    Marching Weights  3 lbs.    Hamstring Curl  AROM;Strengthening;Both;1 set;10 reps;Limitations    Hamstring Limitations  with green band resistance- cues for slow, controlled movements for 10 reps each side.                PT Short Term Goals - 12/29/18 1311      PT SHORT TERM GOAL #1   Title  Pt will be independent with HEP for improved balance, gait, strength.  TARGET 12/29/18    Time  5    Period  Weeks    Status  Achieved      PT SHORT TERM GOAL #2   Title   Pt will perform at least 4 of 5 reps of sit<>stand with UE support, no posterior LOB, for improved safety and efficiency with transfers.    Time  5    Period  Weeks    Status  Achieved      PT SHORT TERM GOAL #3   Title  Pt will improve Berg Balance score to at least 23/56 for decreased fall risk.    Baseline  24/56 12/25/2018    Time  5    Period  Weeks    Status  Achieved      PT SHORT TERM GOAL #4   Title  Pt will improve TUG score to less than or equal to 20 seconds for decreased fall risk.    Baseline  15.81 sec 12/25/2018    Time  5    Period  Weeks    Status  Achieved        PT Long Term Goals - 11/28/18 1501      PT LONG TERM GOAL #1   Title  Pt will verbalize understanding of fall prevention in home environment.    Time  9    Period  Weeks    Status  New    Target Date  01/27/19      PT LONG TERM GOAL #2   Title  Pt will improve gait velocity to at least 2.3 ft/sec for improved gait efficiency and safety.    Time  9    Period  Weeks    Status  New    Target Date  01/27/19      PT LONG TERM GOAL #3   Title  Pt will improve Berg Balance score to at least 33/56 for decreased fall risk.    Time  9    Period  Weeks    Status  New    Target Date  01/27/19      PT LONG TERM GOAL #4   Title  Pt will improve TUG score to less than or equal to 15 seconds for decreased fall risk    Time  9    Period  Weeks    Status  New    Target Date  01/27/19            Plan - 01/09/19 1024    Clinical Impression Statement  Today's session was limited due to pain "all over" and increased unsteadiness with standing (pt with multiple balance losses needing assist to sit saafely on surface behind her). Session focused on activity tolerance and strengtheing with pt reporting her pain as 6/10 after session.     Rehab Potential  Good    PT Frequency  2x / week    PT Duration  8 weeks    PT Treatment/Interventions  ADLs/Self Care Home Management;Therapeutic  exercise;Patient/family education;Therapeutic activities;Functional mobility training;Gait training;DME Instruction;Balance training;Neuromuscular re-education    PT Next Visit Plan  Continue to work on standing tolerance with decreased UE support, continue to work on hip/ankle strategy, step strategy work at [employer], terminal knee extension and add to HEP when safe to do so.    Consulted and Agree with Plan of Care  Patient       Patient will benefit from skilled therapeutic intervention in order to improve the following deficits and impairments:  Abnormal gait, Decreased balance, Decreased strength, Decreased mobility, Difficulty walking, Postural dysfunction  Visit Diagnosis: Unsteadiness on feet  Muscle weakness (generalized)  Other abnormalities of gait and mobility     Problem List Patient Active Problem List   Diagnosis Date Noted  . Pain in joint, ankle and foot 05/16/2018  . Gout 05/16/2018  . Gait instability 01/03/2018  .  Frequent bowel movements 11/02/2017  . Tremor of both hands   . Stuttering   . Essential hypertension   . Hyperlipidemia 08/13/2015  . Breast cancer of lower-outer quadrant of right female breast ([place]) 02/13/2015  . Vitamin D deficiency 02/06/2015  . Type 2 diabetes mellitus with diabetic foot deformity ([place]) 01/01/2014  . Chronic combined systolic and diastolic heart failure ([place]) 02/26/2013  . OBESITY 02/05/2009  . Asthma, intermittent 02/08/2008  . NEPHROLITHIASIS 01/26/2007  . DJD (degenerative joint disease), multiple sites 01/26/2007    Willow Ora, PTA, [facility] [address] [place], [place] [address] [phone] 01/09/19, 8:32 PM   Name: [name] MRN: [id number] Date of Birth: 1951-06-05

## 2019-01-11 ENCOUNTER — Ambulatory Visit: Payer: Medicare Other | Admitting: Physical Therapy

## 2019-01-11 ENCOUNTER — Other Ambulatory Visit: Payer: Self-pay | Admitting: Family Medicine

## 2019-01-11 DIAGNOSIS — M21969 Unspecified acquired deformity of unspecified lower leg: Principal | ICD-10-CM

## 2019-01-11 DIAGNOSIS — E1169 Type 2 diabetes mellitus with other specified complication: Secondary | ICD-10-CM

## 2019-01-12 ENCOUNTER — Encounter: Payer: Self-pay | Admitting: Physical Therapy

## 2019-01-12 ENCOUNTER — Ambulatory Visit: Payer: Medicare Other | Admitting: Physical Therapy

## 2019-01-12 DIAGNOSIS — M6281 Muscle weakness (generalized): Secondary | ICD-10-CM | POA: Diagnosis not present

## 2019-01-12 DIAGNOSIS — R2681 Unsteadiness on feet: Secondary | ICD-10-CM | POA: Diagnosis not present

## 2019-01-12 DIAGNOSIS — R2689 Other abnormalities of gait and mobility: Secondary | ICD-10-CM | POA: Diagnosis not present

## 2019-01-14 NOTE — Therapy (Signed)
[facility] [address] [place], [place], [address] Phone: [phone]   Fax:  [phone]  Physical Therapy Treatment  Patient Details  Name: [name] MRN: [id number] Date of Birth: [birth year] Referring Provider (PT): Talbert Cage, MD   Encounter Date: 01/12/2019     01/12/19 1024  PT Visits / Re-Eval  Visit Number 12  Number of Visits 17  Date for PT Re-Evaluation 02/26/19  Authorization  Authorization Type [employer] Medicare & Medicaid  Authorization Time Period Auth period 11/28/18-02/26/19  PT Time Calculation  PT Start Time 1018  PT Stop Time 1100  PT Time Calculation (min) 42 min  PT - End of Session  Equipment Utilized During Treatment Gait belt  Activity Tolerance Patient tolerated treatment well;No increased pain;Patient limited by pain  Behavior During Therapy [facility] for tasks assessed/performed    Past Medical History:  Diagnosis Date  . Anemia   . Arthritis   . Asthma   . Breast cancer of lower-outer quadrant of right female breast ([place]) 02/13/2015  . Cataract   . CHF, acute ([place]) 05/03/2012  . Depression   . Diabetes mellitus   . Eczema   . Fatty liver    Fatty infiltration of liver noted on 03/2012 CT scan  . Fibromyalgia   . GERD (gastroesophageal reflux disease)   . Glaucoma   . Heart disease   . History of kidney stones    w/ hx of hydronephrosis - followed by [facility]  . HIV nonspecific serology    2006: indeterminate HIV blood test, seen by ID, felt secondary to cross reacting antibodies with no further workup felt necessary at that time   . Hypertension   . Obesity   . Personal history of radiation therapy 2016   right  . Radiation 05/07/15-06/23/15   Right Breast    Past Surgical History:  Procedure Laterality Date  . BREAST BIOPSY Left 02/10/2015   malignant   . BREAST LUMPECTOMY Right 03/21/2015  . CHOLECYSTECTOMY  2003  . CYSTOSCOPY W/ LITHOLAPAXY / EHL    .  JOINT REPLACEMENT     bilateral knee replacement  . LEFT AND RIGHT HEART CATHETERIZATION WITH CORONARY ANGIOGRAM N/A 05/02/2012   Procedure: LEFT AND RIGHT HEART CATHETERIZATION WITH CORONARY ANGIOGRAM;  Surgeon: Burnell Blanks, MD;  Location: [facility] CATH LAB;  Service: Cardiovascular;  Laterality: N/A;  . RADIOACTIVE SEED GUIDED PARTIAL MASTECTOMY WITH AXILLARY SENTINEL LYMPH NODE BIOPSY Right 03/21/2015   Procedure: RIGHT  PARTIAL MASTECTOMY WITH RADIOACTIVE SEED LOCALIZATION RIGHT  AXILLARY SENTINEL LYMPH NODE BIOPSY;  Surgeon: Fanny Skates, MD;  Location: [facility];  Service: General;  Laterality: Right;  . REPLACEMENT TOTAL KNEE BILATERAL  2005 &2006  . VASCULAR SURGERY Right 03/15/2013   Ultrasound guided sclerotherapy    There were no vitals filed for this visit.     01/12/19 1021  Symptoms/Limitations  Subjective No falls. See's ortho MD on Monday at [facility].   Pertinent History Breast cancer 2015:  Neuropathy; DM type 2, arthritis    Patient Stated Goals Pt's goal for therapy is to get built back up to do my normal things.  Pain Assessment  Currently in Pain? Yes  Pain Score 8  Pain Location Generalized (" all over")  Pain Orientation Right;Left  Pain Descriptors / Indicators Aching;Sharp (sharp behind her knees)  Pain Type Chronic pain  Pain Onset More than a month ago  Pain Frequency Constant  Aggravating Factors  rain, cold weather  Pain Relieving Factors pain medications, creame, warm shower      01/12/19 1025  Transfers  Transfers Sit to Stand;Stand to Sit  Sit to Stand 5: Supervision;4: Min guard;With upper extremity assist;From bed;From chair/3-in-1  Stand to Sit 5: Supervision;4: Min assist;With upper extremity assist;To chair/3-in-1;To bed  Ambulation/Gait  Ambulation/Gait Yes  Ambulation/Gait Assistance 5: Supervision;4: Min guard  Ambulation/Gait Assistance Details use of rollator into/out of gym, around gym. 1 episode of  loss of balance with pt self recovering when walking from mat to Nustep (balcance loss toward left lateral side).              Ambulation Distance (Feet) 50 Feet (x2, in/out of gym)  Assistive device Rollator  Gait Pattern Step-through pattern;Decreased stride length;Trunk flexed;Poor foot clearance - left  Ambulation Surface Level;Indoor  High Level Balance  High Level Balance Activities Side stepping (tandem fwd/bwd)  High Level Balance Comments in parallel bars: significant balance loss with 3rd lap of bwd tandem needing mod-max assist to recover (bil knees buckling), then to come back up into standing for chair to be brought to her.; after rest break standing on floor with bil UE support: heel raises x 10 reps, alternating marching x 10 reps each leg and modified tandem working on weight shifting x 5 reps each foot forward. Min guard assist for balance.  Knee/Hip Exercises: Aerobic  Nustep Level 5 with UE/LE's with goal >/= 40 steps per minute for 8 minutes.    Knee/Hip Exercises: Seated  Long Arc Quad Strengthening;Both;1 set;10 reps;Weights;Limitations  Long Arc Quad Weight 3 lbs.  Long [employer] Limitations cues for slow, controlled movements  Marching AAROM;Strengthening;Both;1 set;10 reps;Weights;Limitations  Marching Limitations cues for posture and correct ex form/technique  Marching Weights 3 lbs.        PT Short Term Goals - 12/29/18 1311      PT SHORT TERM GOAL #1   Title  Pt will be independent with HEP for improved balance, gait, strength.  TARGET 12/29/18    Time  5    Period  Weeks    Status  Achieved      PT SHORT TERM GOAL #2   Title  Pt will perform at least 4 of 5 reps of sit<>stand with UE support, no posterior LOB, for improved safety and efficiency with transfers.    Time  5    Period  Weeks    Status  Achieved      PT SHORT TERM GOAL #3   Title  Pt will improve Berg Balance score to at least 23/56 for decreased fall risk.    Baseline  24/56 12/25/2018     Time  5    Period  Weeks    Status  Achieved      PT SHORT TERM GOAL #4   Title  Pt will improve TUG score to less than or equal to 20 seconds for decreased fall risk.    Baseline  15.81 sec 12/25/2018    Time  5    Period  Weeks    Status  Achieved        PT Long Term Goals - 11/28/18 1501      PT LONG TERM GOAL #1   Title  Pt will verbalize understanding of fall prevention in home environment.    Time  9    Period  Weeks    Status  New    Target Date  01/27/19      PT LONG TERM GOAL #  2   Title  Pt will improve gait velocity to at least 2.3 ft/sec for improved gait efficiency and safety.    Time  9    Period  Weeks    Status  New    Target Date  01/27/19      PT LONG TERM GOAL #3   Title  Pt will improve Berg Balance score to at least 33/56 for decreased fall risk.    Time  9    Period  Weeks    Status  New    Target Date  01/27/19      PT LONG TERM GOAL #4   Title  Pt will improve TUG score to less than or equal to 15 seconds for decreased fall risk    Time  9    Period  Weeks    Status  New    Target Date  01/27/19         01/12/19 1024  Plan  Clinical Impression Statement Today's skilled session continued to focus on strengthening, activity tolerance and balance. Pain reported to be 6-7/10 after session.   Pt will benefit from skilled therapeutic intervention in order to improve on the following deficits Abnormal gait;Decreased balance;Decreased strength;Decreased mobility;Difficulty walking;Postural dysfunction  Rehab Potential Good  PT Frequency 2x / week  PT Duration 8 weeks  PT Treatment/Interventions ADLs/Self Care Home Management;Therapeutic exercise;Patient/family education;Therapeutic activities;Functional mobility training;Gait training;DME Instruction;Balance training;Neuromuscular re-education  PT Next Visit Plan Continue to work on standing tolerance with decreased UE support, continue to work on hip/ankle strategy, step strategy work at  [employer], terminal knee extension and add to HEP when safe to do so.  Consulted and Agree with Plan of Care Patient          Patient will benefit from skilled therapeutic intervention in order to improve the following deficits and impairments:  Abnormal gait, Decreased balance, Decreased strength, Decreased mobility, Difficulty walking, Postural dysfunction  Visit Diagnosis: Unsteadiness on feet  Muscle weakness (generalized)  Other abnormalities of gait and mobility  Gait instability     Problem List Patient Active Problem List   Diagnosis Date Noted  . Pain in joint, ankle and foot 05/16/2018  . Gout 05/16/2018  . Gait instability 01/03/2018  . Frequent bowel movements 11/02/2017  . Tremor of both hands   . Stuttering   . Essential hypertension   . Hyperlipidemia 08/13/2015  . Breast cancer of lower-outer quadrant of right female breast ([place]) 02/13/2015  . Vitamin D deficiency 02/06/2015  . Type 2 diabetes mellitus with diabetic foot deformity ([place]) 01/01/2014  . Chronic combined systolic and diastolic heart failure ([place]) 02/26/2013  . OBESITY 02/05/2009  . Asthma, intermittent 02/08/2008  . NEPHROLITHIASIS 01/26/2007  . DJD (degenerative joint disease), multiple sites 01/26/2007    Willow Ora, PTA, [facility] [address] [place], [place] [address] [phone] 01/14/19, 7:82 PM  Name: [name] MRN: [id number] Date of Birth: Feb 27, 1951

## 2019-01-15 ENCOUNTER — Ambulatory Visit (INDEPENDENT_AMBULATORY_CARE_PROVIDER_SITE_OTHER): Payer: Medicare Other | Admitting: Orthopaedic Surgery

## 2019-01-15 ENCOUNTER — Encounter: Payer: Self-pay | Admitting: Physical Therapy

## 2019-01-15 ENCOUNTER — Ambulatory Visit (INDEPENDENT_AMBULATORY_CARE_PROVIDER_SITE_OTHER): Payer: Self-pay

## 2019-01-15 ENCOUNTER — Encounter (INDEPENDENT_AMBULATORY_CARE_PROVIDER_SITE_OTHER): Payer: Self-pay | Admitting: Orthopaedic Surgery

## 2019-01-15 ENCOUNTER — Ambulatory Visit: Payer: Medicare Other | Admitting: Physical Therapy

## 2019-01-15 VITALS — BP 103/68 | HR 76 | Ht 62.0 in | Wt 207.0 lb

## 2019-01-15 DIAGNOSIS — R2689 Other abnormalities of gait and mobility: Secondary | ICD-10-CM

## 2019-01-15 DIAGNOSIS — R2681 Unsteadiness on feet: Secondary | ICD-10-CM | POA: Diagnosis not present

## 2019-01-15 DIAGNOSIS — M545 Low back pain, unspecified: Secondary | ICD-10-CM

## 2019-01-15 DIAGNOSIS — M6281 Muscle weakness (generalized): Secondary | ICD-10-CM

## 2019-01-15 DIAGNOSIS — G8929 Other chronic pain: Secondary | ICD-10-CM

## 2019-01-15 DIAGNOSIS — M25561 Pain in right knee: Secondary | ICD-10-CM | POA: Diagnosis not present

## 2019-01-15 DIAGNOSIS — M25562 Pain in left knee: Secondary | ICD-10-CM

## 2019-01-15 NOTE — Therapy (Signed)
[facility] [address] [place], [place], [address] Phone: [phone]   Fax:  [phone]  Physical Therapy Treatment  Patient Details  Name: [name] MRN: [id number] Date of Birth: [birth year] Referring Provider (PT): Talbert Cage, MD   Encounter Date: 01/15/2019  PT End of Session - 01/15/19 1012    Visit Number  13    Number of Visits  17    Date for PT Re-Evaluation  02/26/19    Authorization Type  [employer]HC Medicare & Medicaid    Authorization Time Period  Auth period 11/28/18-02/26/19    PT Start Time  0930    PT Stop Time  1011    PT Time Calculation (min)  41 min    Equipment Utilized During Treatment  Gait belt    Activity Tolerance  Patient tolerated treatment well;No increased pain;Patient limited by pain    Behavior During Therapy  [facility] for tasks assessed/performed       Past Medical History:  Diagnosis Date  . Anemia   . Arthritis   . Asthma   . Breast cancer of lower-outer quadrant of right female breast ([place]) 02/13/2015  . Cataract   . CHF, acute ([place]) 05/03/2012  . Depression   . Diabetes mellitus   . Eczema   . Fatty liver    Fatty infiltration of liver noted on 03/2012 CT scan  . Fibromyalgia   . GERD (gastroesophageal reflux disease)   . Glaucoma   . Heart disease   . History of kidney stones    w/ hx of hydronephrosis - followed by [facility]  . HIV nonspecific serology    2006: indeterminate HIV blood test, seen by ID, felt secondary to cross reacting antibodies with no further workup felt necessary at that time   . Hypertension   . Obesity   . Personal history of radiation therapy 2016   right  . Radiation 05/07/15-06/23/15   Right Breast    Past Surgical History:  Procedure Laterality Date  . BREAST BIOPSY Left 02/10/2015   malignant   . BREAST LUMPECTOMY Right 03/21/2015  . CHOLECYSTECTOMY  2003  . CYSTOSCOPY W/ LITHOLAPAXY / EHL    . JOINT REPLACEMENT     bilateral knee replacement  . LEFT AND RIGHT HEART CATHETERIZATION WITH CORONARY ANGIOGRAM N/A 05/02/2012   Procedure: LEFT AND RIGHT HEART CATHETERIZATION WITH CORONARY ANGIOGRAM;  Surgeon: Burnell Blanks, MD;  Location: [facility] CATH LAB;  Service: Cardiovascular;  Laterality: N/A;  . RADIOACTIVE SEED GUIDED PARTIAL MASTECTOMY WITH AXILLARY SENTINEL LYMPH NODE BIOPSY Right 03/21/2015   Procedure: RIGHT  PARTIAL MASTECTOMY WITH RADIOACTIVE SEED LOCALIZATION RIGHT  AXILLARY SENTINEL LYMPH NODE BIOPSY;  Surgeon: Fanny Skates, MD;  Location: [facility];  Service: General;  Laterality: Right;  . REPLACEMENT TOTAL KNEE BILATERAL  2005 &2006  . VASCULAR SURGERY Right 03/15/2013   Ultrasound guided sclerotherapy    There were no vitals filed for this visit.  Subjective Assessment - 01/15/19 0932    Subjective  Has orthopedic appointment today. Nothing new to report.    Pertinent History  Breast cancer 2015:  Neuropathy; DM type 2, arthritis      Patient Stated Goals  Pt's goal for therapy is to get built back up to do my normal things.    Currently in Pain?  No/denies    Pain Score  8     Pain Location  Generalized    Pain Orientation  Right;Left  Pain Descriptors / Indicators  Aching;Sharp   especially behind knees   Pain Type  Chronic pain    Pain Onset  More than a month ago    Pain Frequency  Constant                       [facility] Adult PT Treatment/Exercise - 01/15/19 0001      Transfers   Transfers  Sit to Stand;Stand to Sit    Sit to Stand  5: Supervision    Stand to Sit  5: Supervision   cues to lock brakes of rollator before taking hands off when sitting down.     Ambulation/Gait   Ambulation/Gait  Yes    Ambulation/Gait Assistance  5: Supervision;4: Min assist    Ambulation/Gait Assistance Details  Working on activtiy tolerance; pt  had LOB x1 posteriorly with change in direction;  noted Left foot drag x2 during second lap, pt self corrected                                     Ambulation Distance (Feet)  230 Feet    Assistive device  Rollator    Gait Pattern  Step-through pattern;Decreased stride length;Trunk flexed;Poor foot clearance - left;Narrow base of support    Ambulation Surface  Level;Indoor      Knee/Hip Exercises: Aerobic   Nustep  Level 5 with UE/LE's with goal >/= 40 steps per minute for 8 minutes.        Knee/Hip Exercises: Standing   Knee Flexion  Strengthening;Both;2 sets;10 reps   seated hamstring curls with yellow theraband     Knee/Hip Exercises: Supine   Bridges  Strengthening;Both;5 reps;3 sets          Balance Exercises - 01/15/19 1004      Balance Exercises: Standing   Stepping Strategy  Anterior;Lateral;UE support   1 UE support, min guard, cues for weight shifting.   Heel Raises Limitations  heel raises x 10        PT Education - 01/15/19 0944    Education provided  Yes    Education Details  When questioned, pt reports that she does not use AD to go to the bathroom, does not feel like its necessary.  Recommend pt at least use a cane when going to bathroom vs. no device.       PT Short Term Goals - 12/29/18 1311      PT SHORT TERM GOAL #1   Title  Pt will be independent with HEP for improved balance, gait, strength.  TARGET 12/29/18    Time  5    Period  Weeks    Status  Achieved      PT SHORT TERM GOAL #2   Title  Pt will perform at least 4 of 5 reps of sit<>stand with UE support, no posterior LOB, for improved safety and efficiency with transfers.    Time  5    Period  Weeks    Status  Achieved      PT SHORT TERM GOAL #3   Title  Pt will improve Berg Balance score to at least 23/56 for decreased fall risk.    Baseline  24/56 12/25/2018    Time  5    Period  Weeks    Status  Achieved      PT SHORT TERM GOAL #4   Title  Pt  will improve TUG score to less than or equal to 20 seconds for decreased fall risk.    Baseline  15.81 sec 12/25/2018    Time  5    Period  Weeks     Status  Achieved        PT Long Term Goals - 11/28/18 1501      PT LONG TERM GOAL #1   Title  Pt will verbalize understanding of fall prevention in home environment.    Time  9    Period  Weeks    Status  New    Target Date  01/27/19      PT LONG TERM GOAL #2   Title  Pt will improve gait velocity to at least 2.3 ft/sec for improved gait efficiency and safety.    Time  9    Period  Weeks    Status  New    Target Date  01/27/19      PT LONG TERM GOAL #3   Title  Pt will improve Berg Balance score to at least 33/56 for decreased fall risk.    Time  9    Period  Weeks    Status  New    Target Date  01/27/19      PT LONG TERM GOAL #4   Title  Pt will improve TUG score to less than or equal to 15 seconds for decreased fall risk    Time  9    Period  Weeks    Status  New    Target Date  01/27/19            Plan - 01/15/19 0940    Clinical Impression Statement  Skill session focused on LE strengthening, activity tolerance with gait using rollator, and standing balance. Pt reported overall pain stayed about the same 7/10.  Pt seemed to have increased ache in knee with resisted hamstring curls and bridging but seemed to resolve quickly with rest.                                                Rehab Potential  Good    PT Frequency  2x / week    PT Duration  8 weeks    PT Treatment/Interventions  ADLs/Self Care Home Management;Therapeutic exercise;Patient/family education;Therapeutic activities;Functional mobility training;Gait training;DME Instruction;Balance training;Neuromuscular re-education    PT Next Visit Plan  Continue to work on standing tolerance with decreased UE support, continue to work on hip/ankle strategy, step strategy work at [employer], terminal knee extension and add to HEP when safe to do so.    Consulted and Agree with Plan of Care  Patient       Patient will benefit from skilled therapeutic intervention in order to improve the following deficits and  impairments:  Abnormal gait, Decreased balance, Decreased strength, Decreased mobility, Difficulty walking, Postural dysfunction  Visit Diagnosis: Unsteadiness on feet  Muscle weakness (generalized)  Other abnormalities of gait and mobility  Gait instability     Problem List Patient Active Problem List   Diagnosis Date Noted  . Pain in joint, ankle and foot 05/16/2018  . Gout 05/16/2018  . Gait instability 01/03/2018  . Frequent bowel movements 11/02/2017  . Tremor of both hands   . Stuttering   . Essential hypertension   . Hyperlipidemia 08/13/2015  . Breast cancer of lower-outer quadrant  of right female breast ([place]) 02/13/2015  . Vitamin D deficiency 02/06/2015  . Type 2 diabetes mellitus with diabetic foot deformity ([place]) 01/01/2014  . Chronic combined systolic and diastolic heart failure ([place]) 02/26/2013  . OBESITY 02/05/2009  . Asthma, intermittent 02/08/2008  . NEPHROLITHIASIS 01/26/2007  . DJD (degenerative joint disease), multiple sites 01/26/2007    Bjorn Loser, PTA  01/15/19, 12:30 PM [facility] [address] [place], [place], [address] Phone: [phone]   Fax:  [phone]  Name: TYLEA HISE MRN: [id number] Date of Birth: 31-Jul-1951

## 2019-01-15 NOTE — Progress Notes (Signed)
Office Visit Note   Patient: [name]           Date of Birth: Apr 21, 1951           MRN: [id number] Visit Date: 01/15/2019              Requested by: Lind Covert, MD [place], [place] [address] PCP: Lind Covert, MD   Assessment & Plan: Visit Diagnoses:  1. Chronic pain of left knee   2. Chronic pain of right knee   3. Midline low back pain, unspecified chronicity, unspecified whether sciatica present     Plan: Chronic i.e. 4 to 5-year history of bilateral knee pain.  Films reveal good position of the components.  There is no obvious loosening by films.  No effusion.  I wonder if her pain is not referred from her back with stenosis.  Considerable degenerative change of the lumbar spine.  Will obtain MRI scan.  Consider vascular Dopplers office visit over 45 minutes 50% of the time in counseling Follow-Up Instructions: No follow-ups on file.   Orders:  Orders Placed This Encounter  Procedures  . XR KNEE 3 VIEW LEFT  . XR KNEE 3 VIEW RIGHT  . XR Lumbar Spine 2-3 Views   No orders of the defined types were placed in this encounter.     Procedures: No procedures performed   Clinical Data: No additional findings.   Subjective: Chief Complaint  Patient presents with  . Right Knee - Pain  . Left Knee - Pain  Patient presents today with bilateral knee pain. She said that her left is worse. Her knees have been hurting for more than 6years. She has a history of bilateral knee replacements. The right side was replaced in 2005, and the left was replaced in 2004 by Dr.Rendall. They seemed to feel better after replacements, but slowly the pain returned. They both hurt on the anterior and posterior side. She feels like they slip when she walks. She said that they occasionally swell, and keep her up at night [name] relates that she is been having some trouble with her knees for about the last 4 to 5 years.  No history of injury or  trauma.  No fever or chills.  She does have diabetes with neuropathy.  She also has a chronic history of dizziness and has been going to physical therapy to help with her falling episodes.  She does use a rolling walker.  He also has been experiencing some back pain.  She has been followed by her primary care physician as well as a neurologist.  He has been taking gabapentin for neuropathy in both feet. Has been experiencing some discomfort of both of her lower extremities but tickly when she is up and walking for any length of time she has to sit down and "rest". HPI  Review of Systems   Objective: Vital Signs: BP 103/68   Pulse 76   Ht 5\' 2"  (1.575 m)   Wt 207 lb (93.9 kg)   BMI 37.86 kg/m   Physical Exam Constitutional:      Appearance: She is well-developed.  Eyes:     Pupils: Pupils are equal, round, and reactive to light.  Pulmonary:     Effort: Pulmonary effort is normal.  Skin:    General: Skin is warm and dry.  Neurological:     Mental Status: She is alert and oriented to person, place, and time.  Psychiatric:  Behavior: Behavior normal.     Ortho Exam awake alert and oriented x3.  Seems somewhat subdued.  Neither knee was hot red or swollen.  Little bit opening with a varus and valgus stress bilaterally but with a good endpoint.  About a 2 or 3 mm anterior drawer sign bilaterally with a good endpoint.  No effusion.  No localized areas of tenderness.  Legs are warm.  Mild pitting edema both ankles.  Full extension and about 103 to 4 degrees of flexion bilaterally.  Leg raise negative.  Painless range of motion both hips.  Thought she had minimal bilateral posterior tibial pulses  Specialty Comments:  No specialty comments available.  Imaging: Xr Knee 3 View Left  Result Date: 01/15/2019 Films of the left knee were obtained in several projections standing.  Left total knee replacement appears to be in excellent position.  Normal glue mantle without evidence of any  loosening.  Joint spaces are symmetrical.  Patella appears to track in the midline.  Some mild ectopic calcification but no acute changes.  Xr Knee 3 View Right  Result Date: 01/15/2019 Films of the right knee were obtained in several projections standing.  There is a total knee replacement in good position with normal alignment.  No complications.  No obvious loosening of any of the components.  Joint spaces are symmetrical.  Patella appears to track in the midline.  Some ectopic calcification and possibly myositis along the lateral joint line    PMFS History: Patient Active Problem List   Diagnosis Date Noted  . Pain in joint, ankle and foot 05/16/2018  . Gout 05/16/2018  . Gait instability 01/03/2018  . Frequent bowel movements 11/02/2017  . Tremor of both hands   . Stuttering   . Essential hypertension   . Hyperlipidemia 08/13/2015  . Breast cancer of lower-outer quadrant of right female breast ([place]) 02/13/2015  . Vitamin D deficiency 02/06/2015  . Type 2 diabetes mellitus with diabetic foot deformity ([place]) 01/01/2014  . Chronic combined systolic and diastolic heart failure ([place]) 02/26/2013  . OBESITY 02/05/2009  . Asthma, intermittent 02/08/2008  . NEPHROLITHIASIS 01/26/2007  . DJD (degenerative joint disease), multiple sites 01/26/2007   Past Medical History:  Diagnosis Date  . Anemia   . Arthritis   . Asthma   . Breast cancer of lower-outer quadrant of right female breast ([place]) 02/13/2015  . Cataract   . CHF, acute ([place]) 05/03/2012  . Depression   . Diabetes mellitus   . Eczema   . Fatty liver    Fatty infiltration of liver noted on 03/2012 CT scan  . Fibromyalgia   . GERD (gastroesophageal reflux disease)   . Glaucoma   . Heart disease   . History of kidney stones    w/ hx of hydronephrosis - followed by [facility]  . HIV nonspecific serology    2006: indeterminate HIV blood test, seen by ID, felt secondary to cross reacting antibodies with no further workup  felt necessary at that time   . Hypertension   . Obesity   . Personal history of radiation therapy 2016   right  . Radiation 05/07/15-06/23/15   Right Breast    Family History  Problem Relation Age of Onset  . Diabetes Mother   . Stroke Mother   . Heart disease Father   . Anemia Father   . Cancer Sister 17       nose cancer   . Cancer Cousin 30  ovarian cancer   . Cancer Cousin 42       ovarian cancer     Past Surgical History:  Procedure Laterality Date  . BREAST BIOPSY Left 02/10/2015   malignant   . BREAST LUMPECTOMY Right 03/21/2015  . CHOLECYSTECTOMY  2003  . CYSTOSCOPY W/ LITHOLAPAXY / E[facility]    . JOINT REPLACEMENT     bilateral knee replacement  . LEFT AND RIGHT HEART CATHETERIZATION WITH CORONARY ANGIOGRAM N/A 05/02/2012   Procedure: LEFT AND RIGHT HEART CATHETERIZATION WITH CORONARY ANGIOGRAM;  Surgeon: Burnell Blanks, MD;  Location: [facility] CATH LAB;  Service: Cardiovascular;  Laterality: N/A;  . RADIOACTIVE SEED GUIDED PARTIAL MASTECTOMY WITH AXILLARY SENTINEL LYMPH NODE BIOPSY Right 03/21/2015   Procedure: RIGHT  PARTIAL MASTECTOMY WITH RADIOACTIVE SEED LOCALIZATION RIGHT  AXILLARY SENTINEL LYMPH NODE BIOPSY;  Surgeon: Fanny Skates, MD;  Location: [facility];  Service: General;  Laterality: Right;  . REPLACEMENT TOTAL KNEE BILATERAL  2005 &2006  . VASCULAR SURGERY Right 03/15/2013   Ultrasound guided sclerotherapy   Social History   Occupational History  . Occupation: retired-CNA, Microbiologist: RETIRED  . Occupation: Psychologist, sport and exercise  Tobacco Use  . Smoking status: Never Smoker  . Smokeless tobacco: Never Used  Substance and Sexual Activity  . Alcohol use: No    Alcohol/week: 0.0 standard drinks  . Drug use: No  . Sexual activity: Never    Birth control/protection: Post-menopausal

## 2019-01-19 ENCOUNTER — Ambulatory Visit: Payer: Medicare Other | Admitting: Physical Therapy

## 2019-01-19 ENCOUNTER — Encounter: Payer: Self-pay | Admitting: Physical Therapy

## 2019-01-19 DIAGNOSIS — M6281 Muscle weakness (generalized): Secondary | ICD-10-CM

## 2019-01-19 DIAGNOSIS — R2681 Unsteadiness on feet: Secondary | ICD-10-CM

## 2019-01-19 DIAGNOSIS — R2689 Other abnormalities of gait and mobility: Secondary | ICD-10-CM | POA: Diagnosis not present

## 2019-01-19 NOTE — Therapy (Addendum)
[facility] [address] [place], [place], [address] Phone: [phone]   Fax:  [phone]  Physical Therapy Treatment  Patient Details  Name: [name] MRN: [id number] Date of Birth: 05-15-1951 Referring Provider (PT): Talbert Cage, MD   Encounter Date: 01/19/2019  PT End of Session - 01/19/19 1437    Visit Number  14    Number of Visits  17    Date for PT Re-Evaluation  02/26/19    Authorization Type  UHC Medicare & Medicaid    Authorization Time Period  Auth period 11/28/18-02/26/19    PT Start Time  1020    PT Stop Time  1059    PT Time Calculation (min)  39 min    Equipment Utilized During Treatment  Gait belt    Activity Tolerance  Patient tolerated treatment well;No increased pain    Behavior During Therapy  WFL for tasks assessed/performed       Past Medical History:  Diagnosis Date  . Anemia   . Arthritis   . Asthma   . Breast cancer of lower-outer quadrant of right female breast ([place]) 02/13/2015  . Cataract   . CHF, acute ([place]) 05/03/2012  . Depression   . Diabetes mellitus   . Eczema   . Fatty liver    Fatty infiltration of liver noted on 03/2012 CT scan  . Fibromyalgia   . GERD (gastroesophageal reflux disease)   . Glaucoma   . Heart disease   . History of kidney stones    w/ hx of hydronephrosis - followed by [facility]  . HIV nonspecific serology    2006: indeterminate HIV blood test, seen by ID, felt secondary to cross reacting antibodies with no further workup felt necessary at that time   . Hypertension   . Obesity   . Personal history of radiation therapy 2016   right  . Radiation 05/07/15-06/23/15   Right Breast    Past Surgical History:  Procedure Laterality Date  . BREAST BIOPSY Left 02/10/2015   malignant   . BREAST LUMPECTOMY Right 03/21/2015  . CHOLECYSTECTOMY  2003  . CYSTOSCOPY W/ LITHOLAPAXY / [facility]    . JOINT REPLACEMENT     bilateral knee replacement  .  LEFT AND RIGHT HEART CATHETERIZATION WITH CORONARY ANGIOGRAM N/A 05/02/2012   Procedure: LEFT AND RIGHT HEART CATHETERIZATION WITH CORONARY ANGIOGRAM;  Surgeon: Burnell Blanks, MD;  Location: [facility] CATH LAB;  Service: Cardiovascular;  Laterality: N/A;  . RADIOACTIVE SEED GUIDED PARTIAL MASTECTOMY WITH AXILLARY SENTINEL LYMPH NODE BIOPSY Right 03/21/2015   Procedure: RIGHT  PARTIAL MASTECTOMY WITH RADIOACTIVE SEED LOCALIZATION RIGHT  AXILLARY SENTINEL LYMPH NODE BIOPSY;  Surgeon: Fanny Skates, MD;  Location: [facility];  Service: General;  Laterality: Right;  . REPLACEMENT TOTAL KNEE BILATERAL  2005 &2006  . VASCULAR SURGERY Right 03/15/2013   Ultrasound guided sclerotherapy    There were no vitals filed for this visit.  Subjective Assessment - 01/19/19 1025    Subjective  Doing so-so; saw Dr. Durward Fortes and the knees are doing okay.  He thinks it may be my back or my circulation.  I am scheduled to have an MRI on 01/29/19.    Pertinent History  Breast cancer 2015:  Neuropathy; DM type 2, arthritis      Patient Stated Goals  Pt's goal for therapy is to get built back up to do my normal things.    Currently in Pain?  Yes  Pain Score  8     Pain Location  Back   Generalized all over   Pain Orientation  Lower    Pain Descriptors / Indicators  Aching    Pain Type  Chronic pain    Pain Onset  More than a month ago    Pain Frequency  Constant    Aggravating Factors   rain, cold weather    Pain Relieving Factors  pain medications, cream, warm shower, but doesn't help long                       [facility] Adult PT Treatment/Exercise - 01/19/19 0001      Ambulation/Gait   Ambulation/Gait  Yes    Ambulation/Gait Assistance  5: Supervision    Ambulation Distance (Feet)  250 Feet   100 ft x 2   Assistive device  Rollator    Gait Pattern  Step-through pattern;Decreased stride length;Trunk flexed;Poor foot clearance - left;Narrow base of support    Ambulation Surface   Level;Indoor    Gait velocity  13.22 sec = 2.48 ft/sec      Standardized Balance Assessment   Standardized Balance Assessment  Timed Up and Go Test      Timed Up and Go Test   TUG  Normal TUG    Normal TUG (seconds)  17.69      High Level Balance   High Level Balance Activities  Side stepping    High Level Balance Comments  Pt performs standing exercises that she is currently doing at home:  standing at sink heel/toe raises, step taps to side, to back minisquats, all x 10 reps, then sidestepping along counter R and L 2 sets.  No LOB noted through set of standing exercises above.      Self-Care   Self-Care  Other Self-Care Comments    Other Self-Care Comments   Discussed POC, given goals being due next week and pt awaiting MRI of low back to help to determine cause of back/leg pain and leg instability in prolonged standing.  Discussed progress with TUG score and gait velocity and plans for holding PT until after MRI has been completed and she has returned to MD.  Pt is comfortable with this and plans to continue HEP and walking program at home.             PT Education - 01/19/19 1426    Education provided  Yes    Education Details  Review of exercises that pt is doing at home (pt began doing these on her own after doing in therapy-was not formally given, POC (placing pt on hold until after MRI    Person(s) Educated  Patient    Methods  Explanation;Demonstration    Comprehension  Verbalized understanding       PT Short Term Goals - 12/29/18 1311      PT SHORT TERM GOAL #1   Title  Pt will be independent with HEP for improved balance, gait, strength.  TARGET 12/29/18    Time  5    Period  Weeks    Status  Achieved      PT SHORT TERM GOAL #2   Title  Pt will perform at least 4 of 5 reps of sit<>stand with UE support, no posterior LOB, for improved safety and efficiency with transfers.    Time  5    Period  Weeks    Status  Achieved      PT SHORT  TERM GOAL #3   Title  Pt  will improve Berg Balance score to at least 23/56 for decreased fall risk.    Baseline  24/56 12/25/2018    Time  5    Period  Weeks    Status  Achieved      PT SHORT TERM GOAL #4   Title  Pt will improve TUG score to less than or equal to 20 seconds for decreased fall risk.    Baseline  15.81 sec 12/25/2018    Time  5    Period  Weeks    Status  Achieved        PT Long Term Goals - 11/28/18 1501      PT LONG TERM GOAL #1   Title  Pt will verbalize understanding of fall prevention in home environment.    Time  9    Period  Weeks    Status  New    Target Date  01/27/19      PT LONG TERM GOAL #2   Title  Pt will improve gait velocity to at least 2.3 ft/sec for improved gait efficiency and safety.    Time  9    Period  Weeks    Status  New    Target Date  01/27/19      PT LONG TERM GOAL #3   Title  Pt will improve Berg Balance score to at least 33/56 for decreased fall risk.    Time  9    Period  Weeks    Status  New    Target Date  01/27/19      PT LONG TERM GOAL #4   Title  Pt will improve TUG score to less than or equal to 15 seconds for decreased fall risk    Time  9    Period  Weeks    Status  New    Target Date  01/27/19            Plan - 01/19/19 1438    Clinical Impression Statement  Began looking at LTGs this visit, with pt having slight improvement in gait velocity of 2.48 ft/sec (but not to goal of 2.62 ft/sec).  Pt continues to have generalized back, leg pain that is 8/10 in sessions; she has followed up with Dr. Durward Fortes who has cleared knees and now want to look at MRI of low back.  Pt is currently performing HEP in sitting and standing and gait at home and is agreeable to being placed on hold until she gets results from MRI.    Rehab Potential  Good    PT Frequency  2x / week    PT Duration  8 weeks    PT Treatment/Interventions  ADLs/Self Care Home Management;Therapeutic exercise;Patient/family education;Therapeutic activities;Functional mobility  training;Gait training;DME Instruction;Balance training;Neuromuscular re-education    PT Next Visit Plan  Place pt on hold until after MRI low back (01/29/2019 and f/u with Dr. Durward Fortes 02/08/2019); then once she returns, look at [place] and determine POC (d/c versus renewal)    Consulted and Agree with Plan of Care  Patient       Patient will benefit from skilled therapeutic intervention in order to improve the following deficits and impairments:  Abnormal gait, Decreased balance, Decreased strength, Decreased mobility, Difficulty walking, Postural dysfunction  Visit Diagnosis: Unsteadiness on feet  Muscle weakness (generalized)     Problem List Patient Active Problem List   Diagnosis Date Noted  . Pain in joint, ankle and  foot 05/16/2018  . Gout 05/16/2018  . Gait instability 01/03/2018  . Frequent bowel movements 11/02/2017  . Tremor of both hands   . Stuttering   . Essential hypertension   . Hyperlipidemia 08/13/2015  . Breast cancer of lower-outer quadrant of right female breast ([place]) 02/13/2015  . Vitamin D deficiency 02/06/2015  . Type 2 diabetes mellitus with diabetic foot deformity ([place]) 01/01/2014  . Chronic combined systolic and diastolic heart failure ([place]) 02/26/2013  . OBESITY 02/05/2009  . Asthma, intermittent 02/08/2008  . NEPHROLITHIASIS 01/26/2007  . DJD (degenerative joint disease), multiple sites 01/26/2007    Allysia Ingles W. 01/19/2019, 3:56 PM  Frazier Butt., PT   [facility] [address] [place], [place], [address] Phone: [phone]   Fax:  [phone]  Name: [name] MRN: [id number] Date of Birth: [birth year]

## 2019-01-22 ENCOUNTER — Ambulatory Visit: Payer: Medicare Other | Admitting: Physical Therapy

## 2019-01-26 ENCOUNTER — Ambulatory Visit: Payer: Medicare Other | Admitting: Physical Therapy

## 2019-01-29 ENCOUNTER — Ambulatory Visit
Admission: RE | Admit: 2019-01-29 | Discharge: 2019-01-29 | Disposition: A | Discharge status: Home / Self Care | Payer: Medicare Other | Source: Ambulatory Visit | Attending: Orthopaedic Surgery | Admitting: Orthopaedic Surgery

## 2019-01-29 DIAGNOSIS — M545 Low back pain, unspecified: Secondary | ICD-10-CM

## 2019-01-29 DIAGNOSIS — M48061 Spinal stenosis, lumbar region without neurogenic claudication: Secondary | ICD-10-CM | POA: Diagnosis not present

## 2019-01-30 ENCOUNTER — Other Ambulatory Visit: Payer: Self-pay | Admitting: *Deleted

## 2019-01-30 NOTE — Patient Outreach (Signed)
[facility]) Care Management  11/29/9145   LEVERN KALKA [birth year] [phone]  RN Health Coach telephone call to patient.  [employer] compliance verified. Per patient she is doing good. Patient is having some pain in her knee. She is going for a MRI. Patient is currently going to rehab. Per patient she does not check her blood sugars. Patient stated the Dr didn't want her to check it. Patient stated she will be getting her A1C drawn. Per patient her daughter does most of the cooking for her and makes sure she eats right. Patient has agreed to follow up outreach calls.    Current Medications:  Current Outpatient Medications  Medication Sig Dispense Refill  . acetaminophen (TYLENOL 8 HOUR ARTHRITIS PAIN) 650 MG CR tablet Take 650 mg by mouth every 8 (eight) hours as needed for pain.    Marland Kitchen albuterol (PROVENTIL HFA;VENTOLIN HFA) 108 (90 Base) MCG/ACT inhaler Inhale 2 puffs into the lungs every 6 (six) hours as needed for wheezing. 1 Inhaler 3  . allopurinol (ZYLOPRIM) 100 MG tablet Take 1 tablet (100 mg total) by mouth daily. 90 tablet 1  . aspirin 81 MG tablet Take 81 mg by mouth daily.    Marland Kitchen atorvastatin (LIPITOR) 40 MG tablet TAKE 1 TABLET BY MOUTH ONCE DAILY 90 tablet 2  . benazepril (LOTENSIN) 20 MG tablet TAKE 1 TABLET BY MOUTH ONCE DAILY 90 tablet 3  . colchicine 0.6 MG tablet Take 1 tablet (0.6 mg total) by mouth daily. 90 tablet 1  . dorzolamide-timolol (COSOPT) 22.3-6.8 MG/ML ophthalmic solution Place 1 drop into both eyes 2 (two) times daily.  0  . exemestane (AROMASIN) 25 MG tablet TAKE 1 TABLET BY MOUTH ONCE DAILY AFTER BREAKFAST 90 tablet 3  . fluticasone (FLOVENT HFA) 110 MCG/ACT inhaler INHALE 2 PUFFS BY MOUTH TWICE A DAY 12 g 3  . furosemide (LASIX) 20 MG tablet TAKE 1 TABLET BY MOUTH ONCE DAILY 90 tablet 2  . gabapentin (NEURONTIN) 100 MG capsule Take 3 capsules (300 mg total) by mouth 3 (three) times daily. 270 capsule 2  . JARDIANCE 10 MG TABS tablet TAKE 1 TABLET  BY MOUTH DAILY 90 tablet 0  . latanoprost (XALATAN) 0.005 % ophthalmic solution Place 1 drop into both eyes 4 (four) times daily.   0  . metFORMIN (GLUCOPHAGE) 1000 MG tablet TAKE 1 TABLET BY MOUTH TWICE A DAY WITH FOOD 180 tablet 3  . metoprolol succinate (TOPROL-XL) 25 MG 24 hr tablet Take 25 mg by mouth daily.  90 tablet 3  . spironolactone (ALDACTONE) 25 MG tablet Take 1 tablet (25 mg total) by mouth daily. 90 tablet 1  . sulfacetamide (BLEPH-10) 10 % ophthalmic solution INT 1 GTT IN OD QID  1  . traMADol (ULTRAM) 50 MG tablet Take 1 tablet (50 mg total) by mouth every 6 (six) hours as needed. 10 tablet 0   No current facility-administered medications for this visit.     Functional Status:  In your present state of health, do you have any difficulty performing the following activities: 01/30/2019 08/29/2018  Hearing? N N  Vision? N N  Comment - wears reading glasses; eye appt coming up 10/14  Difficulty concentrating or making decisions? N N  Walking or climbing stairs? Y N  Comment knee pain and back pain -  Dressing or bathing? N N  Doing errands, shopping? Y N  Preparing Food and eating ? N N  Using the Toilet? N N  In the past six  months, have you accidently leaked urine? N N  Do you have problems with loss of bowel control? N N  Managing your Medications? N N  Managing your Finances? N N  Housekeeping or managing your Housekeeping? Y N  Some recent data might be hidden    Fall/Depression Screening: Fall Risk  01/30/2019 11/14/2018 08/29/2018  Falls in the past year? 0 0 No  Number falls in past yr: - - -  Injury with Fall? - - -  Risk Factor Category  - - -  Risk for fall due to : Impaired balance/gait;Impaired mobility - Impaired mobility  Risk for fall due to: Comment - - -  Follow up - - -   PHQ 2/9 Scores 01/30/2019 11/14/2018 08/29/2018 07/17/2018 07/03/2018 05/16/2018 02/28/2018  PHQ - 2 Score 0 0 0 0 0 0 0  PHQ- 9 Score - - - - - - -   THN CM Care Plan Problem One      Most Recent Value  Care Plan Problem One  Knowledge Deficit in self management of Diabetes  Role Documenting the Problem One  [place] for Problem One  Active  THN Long Term Goal   Patient will see a decrease in A1C from 8.4 with next blood draw  THN Long Term Goal Start Date  01/30/19  Interventions for Problem One Long Term Goal  RN asked if patient is checking blood sugars. Per patient not the Dr didn't want her to check her blood sugar. RN reiterated that the A1C is increasing. RN sent educational material on why get A1C checked. RN will follow up with next blood draw  THN CM Short Term Goal #1   Patient will be able to make healthier choices with snacks within the next 30 days  Interventions for Short Term Goal #1  RN reiterates healthy eating and reading labels. RN will follow up with further discussion  THN CM Short Term Goal #2   Patient will have a better understanding of low salt diet within the next 30 days  THN CM Short Term Goal #3  Patient will be able to verbalize making health maintenance appointments. RN sent educational material on Diabetes taking care of yourself throughout the year. RN sent Diabetes taking care of yourself day to day. RN will follow up with further outreach for compliance.  THN CM Short Term Goal #3 Start Date  01/30/19  Interventions for Short Tern Goal #3  RN discussed healthy maintenance. RN discussed getting eye exam and foot exam. Taking medications as ordered and follow up with PCP and specialt for care. RN will follow up for compliance.      Assessment:  Patient does not check blood sugars A1C is 8.4 Patient is going to out pt rehab for knee Patient will continue to  benefit from [place] telephonic outreach for education and support for diabetes self management. Patient is taking medications as prescribed Patient is weighing and checking blood pressure daily  Plan:  RN sent 2020 Calendar book for documentation RN discussed  A1C RN discussed health maintenance RN sent EMMI on Why check your A1C RN sent EMMI on Taking care of yourself throughout the year RN sent EMMI on Taking care of yourself day to day RN will follow up within the month of June  [facility] Management [phone]

## 2019-02-06 ENCOUNTER — Ambulatory Visit
Admission: RE | Admit: 2019-02-06 | Discharge: 2019-02-06 | Disposition: A | Discharge status: Home / Self Care | Payer: Medicare Other | Source: Ambulatory Visit | Attending: Hematology | Admitting: Hematology

## 2019-02-06 DIAGNOSIS — C50511 Malignant neoplasm of lower-outer quadrant of right female breast: Secondary | ICD-10-CM

## 2019-02-06 DIAGNOSIS — Z170 Estrogen receptor positive status [ER+]: Principal | ICD-10-CM

## 2019-02-06 DIAGNOSIS — R928 Other abnormal and inconclusive findings on diagnostic imaging of breast: Secondary | ICD-10-CM | POA: Diagnosis not present

## 2019-02-07 ENCOUNTER — Other Ambulatory Visit: Payer: Self-pay

## 2019-02-07 ENCOUNTER — Ambulatory Visit (INDEPENDENT_AMBULATORY_CARE_PROVIDER_SITE_OTHER): Payer: Medicare Other | Admitting: Family Medicine

## 2019-02-07 ENCOUNTER — Encounter: Payer: Self-pay | Admitting: Family Medicine

## 2019-02-07 VITALS — BP 116/64 | HR 80 | Temp 97.8°F | Ht 62.0 in | Wt 211.6 lb

## 2019-02-07 DIAGNOSIS — M21969 Unspecified acquired deformity of unspecified lower leg: Secondary | ICD-10-CM

## 2019-02-07 DIAGNOSIS — I10 Essential (primary) hypertension: Secondary | ICD-10-CM | POA: Diagnosis not present

## 2019-02-07 DIAGNOSIS — R2681 Unsteadiness on feet: Secondary | ICD-10-CM | POA: Diagnosis not present

## 2019-02-07 DIAGNOSIS — E0821 Diabetes mellitus due to underlying condition with diabetic nephropathy: Secondary | ICD-10-CM

## 2019-02-07 DIAGNOSIS — E1169 Type 2 diabetes mellitus with other specified complication: Secondary | ICD-10-CM | POA: Diagnosis not present

## 2019-02-07 DIAGNOSIS — E1121 Type 2 diabetes mellitus with diabetic nephropathy: Secondary | ICD-10-CM | POA: Insufficient documentation

## 2019-02-07 LAB — POCT GLYCOSYLATED HEMOGLOBIN (HGB A1C): HbA1c, POC (controlled diabetic range): 10.5 % — AB (ref 0.0–7.0)

## 2019-02-07 MED ORDER — METFORMIN HCL ER 500 MG PO TB24: 500.0000 mg | ORAL_TABLET | Freq: Every day | ORAL | 1 refills | Status: DC

## 2019-02-07 NOTE — Assessment & Plan Note (Signed)
Stable. Physical Therapy seems to be helping

## 2019-02-07 NOTE — Progress Notes (Signed)
Subjective  [name] is a 68 y.o. female is presenting with the following  GAIT INSTABILITY Feels better.  No falls.  Going to Physical Therapy.  No focal weakness.  Being worked up for backpain had recent MRI showed severe stenosis   DIABETES Disease Monitoring: Blood Sugar ranges(Severity) -not checking  Associated Symptoms- Polyuria/phagia/dipsia- no      Visual problems- no Medications: Compliance(Modifying factor) - not taking metformin was taken off by GI since was having diarrhea.  She does not feel this helped any. Has been eating a lot more sweets Hypoglycemic symptoms- no Timing - continuous  HYPERTENSION Disease Monitoring  Home BP Monitoring (Severity) not checking Symptoms - Chest pain- no    Dyspnea- no  Medications (Modifying factors) Compliance-  brings all her medications  Lightheadedness-  no  Edema- no Timing - continuous  Duration - years ROS - See HPI  PMH Lab Review   Potassium  Date Value Ref Range Status  11/24/2018 4.6 3.5 - 5.2 mmol/L Final  08/03/2017 4.9 3.5 - 5.1 mEq/L Final   Sodium  Date Value Ref Range Status  11/24/2018 135 134 - 144 mmol/L Final  08/03/2017 141 136 - 145 mEq/L Final   Creatinine  Date Value Ref Range Status  09/07/2018 1.36 (H) 0.44 - 1.00 mg/dL Final  08/03/2017 1.5 (H) 0.6 - 1.1 mg/dL Final   Creatinine, Ser  Date Value Ref Range Status  11/24/2018 1.60 (H) 0.57 - 1.00 mg/dL Final          Monitoring Labs and Parameters Last A1C:  Lab Results  Component Value Date   HGBA1C 10.5 (A) 02/07/2019   Last Lipid:     Component Value Date/Time   CHOL 229 (H) 01/07/2016 1635   HDL 39 (L) 01/07/2016 1635   LDLDIRECT 36 03/31/2016 1027   Last Bmet  Potassium  Date Value Ref Range Status  11/24/2018 4.6 3.5 - 5.2 mmol/L Final  08/03/2017 4.9 3.5 - 5.1 mEq/L Final   Sodium  Date Value Ref Range Status  11/24/2018 135 134 - 144 mmol/L Final  08/03/2017 141 136 - 145 mEq/L Final   Creatinine  Date Value  Ref Range Status  09/07/2018 1.36 (H) 0.44 - 1.00 mg/dL Final  08/03/2017 1.5 (H) 0.6 - 1.1 mg/dL Final   Creatinine, Ser  Date Value Ref Range Status  11/24/2018 1.60 (H) 0.57 - 1.00 mg/dL Final      Chief Complaint noted Review of Symptoms - see HPI PMH - Smoking status noted.    Objective Vital Signs reviewed BP 116/64   Pulse 80   Temp 97.8 F (36.6 C) (Oral)   Ht 5\' 2"  (1.575 m)   Wt 211 lb 9.6 oz (96 kg)   SpO2 98%   BMI 38.70 kg/m  Moving around well with her walker   Assessments/Plans  See after visit summary for details of patient instuctions  Essential hypertension Well controlled continue current medications   Gait instability Stable. Physical Therapy seems to be helping   Type 2 diabetes mellitus with diabetic foot deformity Not at goal.  Will add back metformin which seemed effective and according to patient is not effecting her diarrhea   Nephropathy, diabetic ([place]) Will recheck crt next visit

## 2019-02-07 NOTE — Assessment & Plan Note (Signed)
Will recheck crt next visit

## 2019-02-07 NOTE — Assessment & Plan Note (Signed)
>>  ASSESSMENT AND PLAN FOR NEPHROPATHY, DIABETIC ([facility]) WRITTEN ON 02/07/2019  2:34 PM BY Iisha Soyars L, MD  Will recheck crt next visit

## 2019-02-07 NOTE — Assessment & Plan Note (Signed)
Well controlled continue current medications  

## 2019-02-07 NOTE — Patient Instructions (Addendum)
Good to see you today!  Thanks for coming in.  Try the metformin XR one tab every morning.  If doing ok then take one tab twice a day   Try not to celebrate so much  Come back in 2 months to check on the diabetes - [name]

## 2019-02-07 NOTE — Assessment & Plan Note (Addendum)
Not at goal.  Will add back metformin which seemed effective and according to patient is not effecting her diarrhea. Since she was on before I do not think her creatinine elevation precludes restarting this

## 2019-02-08 ENCOUNTER — Encounter (INDEPENDENT_AMBULATORY_CARE_PROVIDER_SITE_OTHER): Payer: Self-pay | Admitting: Orthopaedic Surgery

## 2019-02-08 ENCOUNTER — Ambulatory Visit (INDEPENDENT_AMBULATORY_CARE_PROVIDER_SITE_OTHER): Payer: Medicare Other | Admitting: Orthopaedic Surgery

## 2019-02-08 ENCOUNTER — Other Ambulatory Visit: Payer: Self-pay

## 2019-02-08 VITALS — BP 135/79 | HR 72 | Ht 62.0 in | Wt 204.0 lb

## 2019-02-08 DIAGNOSIS — M48061 Spinal stenosis, lumbar region without neurogenic claudication: Secondary | ICD-10-CM

## 2019-02-08 DIAGNOSIS — I5042 Chronic combined systolic (congestive) and diastolic (congestive) heart failure: Secondary | ICD-10-CM

## 2019-02-08 DIAGNOSIS — E1140 Type 2 diabetes mellitus with diabetic neuropathy, unspecified: Secondary | ICD-10-CM | POA: Diagnosis not present

## 2019-02-08 DIAGNOSIS — I110 Hypertensive heart disease with heart failure: Secondary | ICD-10-CM

## 2019-02-08 DIAGNOSIS — M150 Primary generalized (osteo)arthritis: Secondary | ICD-10-CM

## 2019-02-08 DIAGNOSIS — R269 Unspecified abnormalities of gait and mobility: Secondary | ICD-10-CM

## 2019-02-08 DIAGNOSIS — M545 Low back pain: Secondary | ICD-10-CM | POA: Diagnosis not present

## 2019-02-08 DIAGNOSIS — E1169 Type 2 diabetes mellitus with other specified complication: Secondary | ICD-10-CM

## 2019-02-08 NOTE — Progress Notes (Signed)
Office Visit Note   Patient: [name]           Date of Birth: May 01, 1951           MRN: [id number] Visit Date: 02/08/2019              Requested by: Lind Covert, MD [place], [place] [id number] PCP: Lind Covert, MD   Assessment & Plan: Visit Diagnoses:  1. Spinal stenosis of lumbar region without neurogenic claudication     Plan: MRI scan reveals degenerative anterior listhesis of L5 due to severe facet disease.  There is moderately severe spinal and bilateral recess stenosis at L4-5 and L5-S1.  Mrs. Ulatowski has been experiencing claudication involving both of her lower extremities.  We had a long discussion over approximately 20 to 30 minutes regarding her diagnosis and treatment options including physical therapy which she is presently attending, epidural steroid injection or even referral to a spinal surgeon.  She would like to think about the options and call us back.  I have given her a copy of her MRI scan that she can share with her family  Follow-Up Instructions: Return if symptoms worsen or fail to improve.   Orders:  No orders of the defined types were placed in this encounter.  No orders of the defined types were placed in this encounter.     Procedures: No procedures performed   Clinical Data: No additional findings.   Subjective: Chief Complaint  Patient presents with  . Lower Back - Follow-up  Patient is here presenting for a three week follow up for her lower back. She had an MRI on 01-29-2019. She is continuing to have pain. She is taking OTC pain medicine.  Has recently switched her diabetic medicine as she was experiencing diarrhea from Metformin.  She does have chronic low back pain and has difficulty with bilateral lower extremity pain soreness and heaviness when she is standing for a length of time or walks for any distance.  She does use a rolling walker for assistance  HPI  Review of Systems   Objective:  Vital Signs: BP 135/79   Pulse 72   Ht 5\' 2"  (1.575 m)   Wt 204 lb (92.5 kg)   BMI 37.31 kg/m   Physical Exam Constitutional:      Appearance: She is well-developed.  Eyes:     Pupils: Pupils are equal, round, and reactive to light.  Pulmonary:     Effort: Pulmonary effort is normal.  Skin:    General: Skin is warm and dry.  Neurological:     Mental Status: She is alert and oriented to person, place, and time.  Psychiatric:        Behavior: Behavior normal.     Ortho Exam awake alert and oriented x3.  Ambulates with the use of a walker.  She does have back pain and lower extremity claudication.  Straight leg raise is negative.  Painless range of motion of both hips.  No percussible tenderness of lumbar spine Specialty Comments:  No specialty comments available.  Imaging: No results found.   PMFS History: Patient Active Problem List   Diagnosis Date Noted  . Spinal stenosis of lumbar region without neurogenic claudication 02/08/2019  . Nephropathy, diabetic ([place]) 02/07/2019  . Gout 05/16/2018  . Gait instability 01/03/2018  . Frequent bowel movements 11/02/2017  . Tremor of both hands   . Stuttering   . Essential hypertension   . Hyperlipidemia  08/13/2015  . Breast cancer of lower-outer quadrant of right female breast ([place]) 02/13/2015  . Vitamin D deficiency 02/06/2015  . Type 2 diabetes mellitus with diabetic foot deformity ([place]) 01/01/2014  . Chronic combined systolic and diastolic heart failure ([place]) 02/26/2013  . OBESITY 02/05/2009  . Asthma, intermittent 02/08/2008  . NEPHROLITHIASIS 01/26/2007  . DJD (degenerative joint disease), multiple sites 01/26/2007   Past Medical History:  Diagnosis Date  . Anemia   . Arthritis   . Asthma   . Breast cancer of lower-outer quadrant of right female breast ([place]) 02/13/2015  . Cataract   . CHF, acute ([place]) 05/03/2012  . Depression   . Diabetes mellitus   . Eczema   . Fatty liver    Fatty infiltration of liver noted  on 03/2012 CT scan  . Fibromyalgia   . GERD (gastroesophageal reflux disease)   . Glaucoma   . Heart disease   . History of kidney stones    w/ hx of hydronephrosis - followed by [facility]  . HIV nonspecific serology    2006: indeterminate HIV blood test, seen by ID, felt secondary to cross reacting antibodies with no further workup felt necessary at that time   . Hypertension   . Obesity   . Personal history of radiation therapy 2016   right  . Radiation 05/07/15-06/23/15   Right Breast    Family History  Problem Relation Age of Onset  . Diabetes Mother   . Stroke Mother   . Heart disease Father   . Anemia Father   . Cancer Sister 17       nose cancer   . Cancer Cousin 30       ovarian cancer   . Cancer Cousin 64       ovarian cancer     Past Surgical History:  Procedure Laterality Date  . BREAST BIOPSY Left 02/10/2015   malignant   . BREAST LUMPECTOMY Right 03/21/2015  . CHOLECYSTECTOMY  2003  . CYSTOSCOPY W/ LITHOLAPAXY / [facility]    . JOINT REPLACEMENT     bilateral knee replacement  . LEFT AND RIGHT HEART CATHETERIZATION WITH CORONARY ANGIOGRAM N/A 05/02/2012   Procedure: LEFT AND RIGHT HEART CATHETERIZATION WITH CORONARY ANGIOGRAM;  Surgeon: Burnell Blanks, MD;  Location: [facility] CATH LAB;  Service: Cardiovascular;  Laterality: N/A;  . RADIOACTIVE SEED GUIDED PARTIAL MASTECTOMY WITH AXILLARY SENTINEL LYMPH NODE BIOPSY Right 03/21/2015   Procedure: RIGHT  PARTIAL MASTECTOMY WITH RADIOACTIVE SEED LOCALIZATION RIGHT  AXILLARY SENTINEL LYMPH NODE BIOPSY;  Surgeon: Fanny Skates, MD;  Location: [facility];  Service: General;  Laterality: Right;  . REPLACEMENT TOTAL KNEE BILATERAL  2005 &2006  . VASCULAR SURGERY Right 03/15/2013   Ultrasound guided sclerotherapy   Social History   Occupational History  . Occupation: retired-CNA, Microbiologist: RETIRED  . Occupation: Psychologist, sport and exercise  Tobacco Use  . Smoking status: Never Smoker  .  Smokeless tobacco: Never Used  Substance and Sexual Activity  . Alcohol use: No    Alcohol/week: 0.0 standard drinks  . Drug use: No  . Sexual activity: Never    Birth control/protection: Post-menopausal

## 2019-02-09 ENCOUNTER — Encounter: Payer: Self-pay | Admitting: Physical Therapy

## 2019-02-09 ENCOUNTER — Ambulatory Visit: Payer: Medicare Other | Attending: Family Medicine | Admitting: Physical Therapy

## 2019-02-09 DIAGNOSIS — M6281 Muscle weakness (generalized): Secondary | ICD-10-CM | POA: Insufficient documentation

## 2019-02-09 DIAGNOSIS — M545 Low back pain: Secondary | ICD-10-CM | POA: Insufficient documentation

## 2019-02-09 DIAGNOSIS — R2681 Unsteadiness on feet: Secondary | ICD-10-CM | POA: Diagnosis not present

## 2019-02-09 DIAGNOSIS — G8929 Other chronic pain: Secondary | ICD-10-CM | POA: Diagnosis not present

## 2019-02-09 DIAGNOSIS — R2689 Other abnormalities of gait and mobility: Secondary | ICD-10-CM | POA: Insufficient documentation

## 2019-02-09 NOTE — Patient Instructions (Addendum)
Fall Prevention in the Home, Adult  Falls can cause injuries. They can happen to people of all ages. There are many things you can do to make your home safe and to help prevent falls. Ask for help when making these changes, if needed.  What actions can I take to prevent falls?  General Instructions  · Use good lighting in all rooms. Replace any light bulbs that burn out.  · Turn on the lights when you go into a dark area. Use night-lights.  · Keep items that you use often in easy-to-reach places. Lower the shelves around your home if necessary.  · Set up your furniture so you have a clear path. Avoid moving your furniture around.  · Do not have throw rugs and other things on the floor that can make you trip.  · Avoid walking on wet floors.  · If any of your floors are uneven, fix them.  · Add color or contrast paint or tape to clearly mark and help you see:  ? Any grab bars or handrails.  ? First and last steps of stairways.  ? Where the edge of each step is.  · If you use a stepladder:  ? Make sure that it is fully opened. Do not climb a closed stepladder.  ? Make sure that both sides of the stepladder are locked into place.  ? Ask someone to hold the stepladder for you while you use it.  · If there are any pets around you, be aware of where they are.  What can I do in the bathroom?         · Keep the floor dry. Clean up any water that spills onto the floor as soon as it happens.  · Remove soap buildup in the tub or shower regularly.  · Use non-skid mats or decals on the floor of the tub or shower.  · Attach bath mats securely with double-sided, non-slip rug tape.  · If you need to sit down in the shower, use a plastic, non-slip stool.  · Install grab bars by the toilet and in the tub and shower. Do not use towel bars as grab bars.  What can I do in the bedroom?  · Make sure that you have a light by your bed that is easy to reach.  · Do not use any sheets or blankets that are too big for your bed. They should  not hang down onto the floor.  · Have a firm chair that has side arms. You can use this for support while you get dressed.  What can I do in the kitchen?  · Clean up any spills right away.  · If you need to reach something above you, use a strong step stool that has a grab bar.  · Keep electrical cords out of the way.  · Do not use floor polish or wax that makes floors slippery. If you must use wax, use non-skid floor wax.  What can I do with my stairs?  · Do not leave any items on the stairs.  · Make sure that you have a light switch at the top of the stairs and the bottom of the stairs. If you do not have them, ask someone to add them for you.  · Make sure that there are handrails on both sides of the stairs, and use them. Fix handrails that are broken or loose. Make sure that handrails are as long as the stairways.  ·   Install non-slip stair treads on all stairs in your home.  · Avoid having throw rugs at the top or bottom of the stairs. If you do have throw rugs, attach them to the floor with carpet tape.  · Choose a carpet that does not hide the edge of the steps on the stairway.  · Check any carpeting to make sure that it is firmly attached to the stairs. Fix any carpet that is loose or worn.  What can I do on the outside of my home?  · Use bright outdoor lighting.  · Regularly fix the edges of walkways and driveways and fix any cracks.  · Remove anything that might make you trip as you walk through a door, such as a raised step or threshold.  · Trim any bushes or trees on the path to your home.  · Regularly check to see if handrails are loose or broken. Make sure that both sides of any steps have handrails.  · Install guardrails along the edges of any raised decks and porches.  · Clear walking paths of anything that might make someone trip, such as tools or rocks.  · Have any leaves, snow, or ice cleared regularly.  · Use sand or salt on walking paths during winter.  · Clean up any spills in your garage right  away. This includes grease or oil spills.  What other actions can I take?  · Wear shoes that:  ? Have a low heel. Do not wear high heels.  ? Have rubber bottoms.  ? Are comfortable and fit you well.  ? Are closed at the toe. Do not wear open-toe sandals.  · Use tools that help you move around (mobility aids) if they are needed. These include:  ? Canes.  ? Walkers.  ? Scooters.  ? Crutches.  · Review your medicines with your doctor. Some medicines can make you feel dizzy. This can increase your chance of falling.  Ask your doctor what other things you can do to help prevent falls.  Where to find more information  · Centers for Disease Control and Prevention, [employer]: https://cdc.gov  · National Institute on Aging: https://go4life.nia.nih.gov  Contact a doctor if:  · You are afraid of falling at home.  · You feel weak, drowsy, or dizzy at home.  · You fall at home.  Summary  · There are many simple things that you can do to make your home safe and to help prevent falls.  · Ways to make your home safe include removing tripping hazards and installing grab bars in the bathroom.  · Ask for help when making these changes in your home.  This information is not intended to replace advice given to you by your health care provider. Make sure you discuss any questions you have with your health care provider.  Document Released: 09/11/2009 Document Revised: 06/30/2017 Document Reviewed: 06/30/2017  Elsevier Interactive Patient Education © 2019 [employer]

## 2019-02-09 NOTE — Therapy (Signed)
[facility] [address] [place], [place], [address] Phone: [phone]   Fax:  [phone]  Physical Therapy Treatment  Patient Details  Name: [name] MRN: [id number] Date of Birth: Dec 30, 1950 Referring Provider ([name]): Talbert Cage, MD   Encounter Date: 02/09/2019  [name] End of Session - 02/09/19 2108    Visit Number  15    Number of Visits  23    Date for [name] Re-Evaluation  70/62/37   per recert 05/26/3150   Authorization Type  [employer] Medicare & Medicaid    Authorization Time Period  Auth period 11/28/18-02/26/19    [name] Start Time  1100    [name] Stop Time  1145    [name] Time Calculation (min)  45 min    Equipment Utilized During Treatment  Gait belt    Activity Tolerance  Patient tolerated treatment well;No increased pain    Behavior During Therapy  WFL for tasks assessed/performed       Past Medical History:  Diagnosis Date  . Anemia   . Arthritis   . Asthma   . Breast cancer of lower-outer quadrant of right female breast ([place]) 02/13/2015  . Cataract   . CHF, acute ([place]) 05/03/2012  . Depression   . Diabetes mellitus   . Eczema   . Fatty liver    Fatty infiltration of liver noted on 03/2012 CT scan  . Fibromyalgia   . GERD (gastroesophageal reflux disease)   . Glaucoma   . Heart disease   . History of kidney stones    w/ hx of hydronephrosis - followed by [facility]  . HIV nonspecific serology    2006: indeterminate HIV blood test, seen by ID, felt secondary to cross reacting antibodies with no further workup felt necessary at that time   . Hypertension   . Obesity   . Personal history of radiation therapy 2016   right  . Radiation 05/07/15-06/23/15   Right Breast    Past Surgical History:  Procedure Laterality Date  . BREAST BIOPSY Left 02/10/2015   malignant   . BREAST LUMPECTOMY Right 03/21/2015  . CHOLECYSTECTOMY  2003  . CYSTOSCOPY W/ LITHOLAPAXY / EHL    . JOINT REPLACEMENT     bilateral knee replacement  . LEFT AND RIGHT HEART CATHETERIZATION WITH CORONARY ANGIOGRAM N/A 05/02/2012   Procedure: LEFT AND RIGHT HEART CATHETERIZATION WITH CORONARY ANGIOGRAM;  Surgeon: Burnell Blanks, MD;  Location: [facility] CATH LAB;  Service: Cardiovascular;  Laterality: N/A;  . RADIOACTIVE SEED GUIDED PARTIAL MASTECTOMY WITH AXILLARY SENTINEL LYMPH NODE BIOPSY Right 03/21/2015   Procedure: RIGHT  PARTIAL MASTECTOMY WITH RADIOACTIVE SEED LOCALIZATION RIGHT  AXILLARY SENTINEL LYMPH NODE BIOPSY;  Surgeon: Fanny Skates, MD;  Location: [facility];  Service: General;  Laterality: Right;  . REPLACEMENT TOTAL KNEE BILATERAL  2005 &2006  . VASCULAR SURGERY Right 03/15/2013   Ultrasound guided sclerotherapy    There were no vitals filed for this visit.  Subjective Assessment - 02/09/19 1108    Subjective  [name] reports getting MRI and seeing Dr. Durward Fortes.  He gave several options, including continued therapy, injections and surgery.  Not sure yet what I want to do.    Pertinent History  Breast cancer 2015:  Neuropathy; DM type 2, arthritis      Patient Stated Goals  [name]'s goal for therapy is to get built back up to do my normal things.    Currently in Pain?  Yes    Pain  Score  7     Pain Location  Knee    Pain Orientation  Left    Pain Descriptors / Indicators  Aching    Pain Type  Chronic pain    Pain Onset  More than a month ago    Aggravating Factors   rain, cold weather    Pain Relieving Factors  warm shower, cream, pain meds    Pain Score  7    Pain Location  Back    Pain Orientation  Lower    Pain Descriptors / Indicators  Aching    Pain Type  Chronic pain    Pain Onset  More than a month ago    Pain Frequency  Constant    Aggravating Factors   weather, cold    Pain Relieving Factors  hot shower, pain         [facility] [name] Assessment - 02/09/19 0001      Observation/Other Assessments   Other Surveys   Oswestry Disability Index    Oswestry Disability Index   51%  disability rating (Scores 4 on pain intensity, 4 on lifting, 4 on walking, 3 on social life)      Berg Balance Test   Sit to Stand  Able to stand without using hands and stabilize independently    Standing Unsupported  Able to stand 2 minutes with supervision    Sitting with Back Unsupported but Feet Supported on Floor or Stool  Able to sit safely and securely 2 minutes    Stand to Sit  Sits safely with minimal use of hands    Transfers  Able to transfer safely, minor use of hands    Standing Unsupported with Eyes Closed  Able to stand 10 seconds with supervision    Standing Unsupported with Feet Together  Able to place feet together independently and stand for 1 minute with supervision    From Standing, Reach Forward with Outstretched Arm  Can reach forward >12 cm safely (5")    From Standing Position, Pick up Object from Floor  Unable to try/needs assist to keep balance    From Standing Position, Turn to Look Behind Over each Shoulder  Looks behind one side only/other side shows less weight shift    Turn 360 Degrees  Needs assistance while turning    Standing Unsupported, Alternately Place Feet on Step/Stool  Needs assistance to keep from falling or unable to try    Standing Unsupported, One Foot in Front  Able to take small step independently and hold 30 seconds    Standing on One Leg  Unable to try or needs assist to prevent fall    Total Score  33    Berg comment:  Improved from 24/56                   [facility] Adult [name] Treatment/Exercise - 02/09/19 0001      Ambulation/Gait   Ambulation/Gait  Yes    Ambulation/Gait Assistance  5: Supervision    Ambulation Distance (Feet)  100 Feet   x 2; 115 ft   Assistive device  Rollator    Gait Pattern  Step-through pattern;Decreased stride length;Trunk flexed;Poor foot clearance - left;Narrow base of support    Ambulation Surface  Level;Indoor    Gait velocity  2.19 ft/sec      Timed Up and Go Test   TUG  Normal TUG    Normal TUG  (seconds)  15.29      Self-Care     Self-Care  Other Self-Care Comments    Other Self-Care Comments   Discussed recent doctor's visit regarding back pain, POC-including [name] agreeable to continueing [name] additional visits to try to focus on more back stretching, stability, modalities as needed for back pain in relation to functional mobility.  Discussed fall prevention in home environment, and [name] verbalizes understanding.               [name] Education - 02/09/19 2107    Education provided  Yes    Education Details  Fall prevention education, POC (plans to continue additional 4 weeks with emphasis on back pain/mobility)    Person(s) Educated  Patient    Methods  Explanation;Demonstration;Handout    Comprehension  Verbalized understanding       [name] Short Term Goals - 12/29/18 1311      [name] SHORT TERM GOAL #1   Title  [name] will be independent with HEP for improved balance, gait, strength.  TARGET 12/29/18    Time  5    Period  Weeks    Status  Achieved      [name] SHORT TERM GOAL #2   Title  [name] will perform at least 4 of 5 reps of sit<>stand with UE support, no posterior LOB, for improved safety and efficiency with transfers.    Time  5    Period  Weeks    Status  Achieved      [name] SHORT TERM GOAL #3   Title  [name] will improve Berg Balance score to at least 23/56 for decreased fall risk.    Baseline  24/56 12/25/2018    Time  5    Period  Weeks    Status  Achieved      [name] SHORT TERM GOAL #4   Title  [name] will improve TUG score to less than or equal to 20 seconds for decreased fall risk.    Baseline  15.81 sec 12/25/2018    Time  5    Period  Weeks    Status  Achieved        [name] Long Term Goals - 02/09/19 1115      [name] LONG TERM GOAL #1   Title  [name] will verbalize understanding of fall prevention in home environment.    Time  9    Period  Weeks    Status  Achieved      [name] LONG TERM GOAL #2   Title  [name] will improve gait velocity to at least 2.3 ft/sec for improved gait efficiency and  safety.    Baseline  2.19 ft/sec 02/09/2019    Time  9    Period  Weeks    Status  Partially Met      [name] LONG TERM GOAL #3   Title  [name] will improve Berg Balance score to at least 33/56 for decreased fall risk.    Time  9    Period  Weeks    Status  Achieved      [name] LONG TERM GOAL #4   Title  [name] will improve TUG score to less than or equal to 15 seconds for decreased fall risk    Baseline  15.29 sec 02/09/2019    Time  9    Period  Weeks    Status  Achieved            Plan - 02/09/19 2109    Clinical Impression Statement  With recent MRI and visit to Dr. Whitfield, [name] notes   that majority of pain is likely coming from her back (MRI shows stenosis, disc bulge), and he gives options of continued therapy, injections (she reports haven't helped in past), and surgery.  She is unsure which option she will proceed with, but she is agreeable to continueing [name] to more further address back pain in regards to mobility.  [name] has met LTG 1, 3, 4.  LTG 2 not met for gt velocity.  [name] has demonstrated improvement in Berg and TUG scores, but remains at fall risk.  Oswestry Disability Index score today 51%.  Please see recert/renewal for additional details and goals.    Rehab Potential  Good    [name] Frequency  2x / week    [name] Duration  4 weeks   per recert 01/10/9416   [name] Treatment/Interventions  ADLs/Self Care Home Management;Therapeutic exercise;Patient/family education;Therapeutic activities;Functional mobility training;Gait training;DME Instruction;Balance training;Neuromuscular re-education    [name] Next Visit Plan  Place [name] on hold until after MRI low back (01/29/2019 and f/u with Dr. Durward Fortes 02/08/2019); then once she returns, look at [place] and determine POC (d/c versus renewal)    Consulted and Agree with Plan of Care  Patient       Patient will benefit from skilled therapeutic intervention in order to improve the following deficits and impairments:  Abnormal gait, Decreased balance, Decreased  strength, Decreased mobility, Difficulty walking, Postural dysfunction  Visit Diagnosis: Unsteadiness on feet  Other abnormalities of gait and mobility  Muscle weakness (generalized)  Chronic midline low back pain, unspecified whether sciatica present     Problem List Patient Active Problem List   Diagnosis Date Noted  . Spinal stenosis of lumbar region without neurogenic claudication 02/08/2019  . Nephropathy, diabetic ([place]) 02/07/2019  . Gout 05/16/2018  . Gait instability 01/03/2018  . Frequent bowel movements 11/02/2017  . Tremor of both hands   . Stuttering   . Essential hypertension   . Hyperlipidemia 08/13/2015  . Breast cancer of lower-outer quadrant of right female breast ([place]) 02/13/2015  . Vitamin D deficiency 02/06/2015  . Type 2 diabetes mellitus with diabetic foot deformity ([place]) 01/01/2014  . Chronic combined systolic and diastolic heart failure ([place]) 02/26/2013  . OBESITY 02/05/2009  . Asthma, intermittent 02/08/2008  . NEPHROLITHIASIS 01/26/2007  . DJD (degenerative joint disease), multiple sites 01/26/2007    Kirsten Kosch W. 02/09/2019, 9:15 PM  Kirsten Butt., [name]   [name] [address] [place], [place], [address] Phone: [phone]   Fax:  [phone]  Name: [name] MRN: [id number] Date of Birth: 06-11-1951  [name] Long Term Goals - 02/09/19 2116      [name] LONG TERM GOAL #1   Title  [name] will be independent with progression of HEP, to include low back flexibility, stability exercises to help reduce pain.  TARGET 03/09/2019    Time  4    Period  Weeks    Status  New    Target Date  03/09/19      [name] LONG TERM GOAL #2   Title  [name] will improve gait velocity to at least 2.3 ft/sec for improved gait efficiency and safety.    Baseline  2.19 ft/sec 02/09/2019    Time  4    Period  Weeks    Status  On-going    Target Date  03/09/19      [name] LONG TERM GOAL #3   Title  [name] will  improve Berg Balance score to at least 38/56 for decreased  fall risk.    Time  4    Period  Weeks    Status  Revised    Target Date  03/09/19      [name] LONG TERM GOAL #4   Title  [name] will improve Oswestry Disability index by 10% for improved functional mobility.    Baseline  51% 02/09/2019    Time  4    Period  Weeks    Status  New    Target Date  03/09/19      [name] LONG TERM GOAL #5   Title  [name] will verbalize plans for continued community fitness upon d/c from [name].    Time  4    Period  Weeks    Status  New    Target Date  03/09/19      [name], [name] 02/09/19 9:19 PM Phone: [phone] Fax: [phone]    

## 2019-02-13 ENCOUNTER — Ambulatory Visit: Payer: Medicare Other | Admitting: Physical Therapy

## 2019-02-14 ENCOUNTER — Ambulatory Visit: Payer: Medicare Other | Admitting: Physical Therapy

## 2019-02-14 ENCOUNTER — Other Ambulatory Visit: Payer: Self-pay

## 2019-02-14 ENCOUNTER — Encounter: Payer: Self-pay | Admitting: Physical Therapy

## 2019-02-14 DIAGNOSIS — R2681 Unsteadiness on feet: Secondary | ICD-10-CM | POA: Diagnosis not present

## 2019-02-14 DIAGNOSIS — R2689 Other abnormalities of gait and mobility: Secondary | ICD-10-CM | POA: Diagnosis not present

## 2019-02-14 DIAGNOSIS — M545 Low back pain, unspecified: Secondary | ICD-10-CM

## 2019-02-14 DIAGNOSIS — G8929 Other chronic pain: Secondary | ICD-10-CM | POA: Diagnosis not present

## 2019-02-14 DIAGNOSIS — M6281 Muscle weakness (generalized): Secondary | ICD-10-CM

## 2019-02-14 NOTE — Therapy (Signed)
[facility] [address] [place], [place], [address] Phone: [phone]   Fax:  [phone]  Physical Therapy Treatment  Patient Details  Name: [name] MRN: [id number] Date of Birth: 02/09/1951 Referring Provider (PT): Talbert Cage, MD   Encounter Date: 02/14/2019  PT End of Session - 02/14/19 1537    Visit Number  16    Number of Visits  23    Date for PT Re-Evaluation  53/61/44   per recert 02/11/4007   Authorization Type  [employer]HC Medicare & Medicaid    Authorization Time Period  Auth period 11/28/18-02/26/19    PT Start Time  1532    PT Stop Time  1615    PT Time Calculation (min)  43 min    Equipment Utilized During Treatment  Gait belt    Activity Tolerance  Patient tolerated treatment well;No increased pain    Behavior During Therapy  WFL for tasks assessed/performed       Past Medical History:  Diagnosis Date  . Anemia   . Arthritis   . Asthma   . Breast cancer of lower-outer quadrant of right female breast ([place]) 02/13/2015  . Cataract   . CHF, acute ([place]) 05/03/2012  . Depression   . Diabetes mellitus   . Eczema   . Fatty liver    Fatty infiltration of liver noted on 03/2012 CT scan  . Fibromyalgia   . GERD (gastroesophageal reflux disease)   . Glaucoma   . Heart disease   . History of kidney stones    w/ hx of hydronephrosis - followed by [facility]  . HIV nonspecific serology    2006: indeterminate HIV blood test, seen by ID, felt secondary to cross reacting antibodies with no further workup felt necessary at that time   . Hypertension   . Obesity   . Personal history of radiation therapy 2016   right  . Radiation 05/07/15-06/23/15   Right Breast    Past Surgical History:  Procedure Laterality Date  . BREAST BIOPSY Left 02/10/2015   malignant   . BREAST LUMPECTOMY Right 03/21/2015  . CHOLECYSTECTOMY  2003  . CYSTOSCOPY W/ LITHOLAPAXY / EHL    . JOINT REPLACEMENT      bilateral knee replacement  . LEFT AND RIGHT HEART CATHETERIZATION WITH CORONARY ANGIOGRAM N/A 05/02/2012   Procedure: LEFT AND RIGHT HEART CATHETERIZATION WITH CORONARY ANGIOGRAM;  Surgeon: Burnell Blanks, MD;  Location: [facility] CATH LAB;  Service: Cardiovascular;  Laterality: N/A;  . RADIOACTIVE SEED GUIDED PARTIAL MASTECTOMY WITH AXILLARY SENTINEL LYMPH NODE BIOPSY Right 03/21/2015   Procedure: RIGHT  PARTIAL MASTECTOMY WITH RADIOACTIVE SEED LOCALIZATION RIGHT  AXILLARY SENTINEL LYMPH NODE BIOPSY;  Surgeon: Fanny Skates, MD;  Location: [facility];  Service: General;  Laterality: Right;  . REPLACEMENT TOTAL KNEE BILATERAL  2005 &2006  . VASCULAR SURGERY Right 03/15/2013   Ultrasound guided sclerotherapy    There were no vitals filed for this visit.  Subjective Assessment - 02/14/19 1535    Subjective  No new falls. No new complaints.     Pertinent History  Breast cancer 2015:  Neuropathy; DM type 2, arthritis      Patient Stated Goals  Pt's goal for therapy is to get built back up to do my normal things.    Currently in Pain?  Yes    Pain Score  7     Pain Location  Knee    Pain Orientation  Left  Pain Descriptors / Indicators  Aching    Pain Type  Chronic pain    Pain Frequency  Constant    Aggravating Factors   rain, cold weather, standing up for a prolonged time    Pain Relieving Factors  warm shower, creame, pain meds    Pain Score  6    Pain Location  Back    Pain Orientation  Lower    Pain Descriptors / Indicators  Aching    Pain Type  Chronic pain    Pain Onset  More than a month ago    Pain Frequency  Constant    Aggravating Factors   weather, cold    Pain Relieving Factors  hot shower, pain meds             [facility] Adult PT Treatment/Exercise - 02/14/19 1538      Transfers   Transfers  Sit to Stand;Stand to Sit    Sit to Stand  5: Supervision    Stand to Sit  5: Supervision      Ambulation/Gait   Ambulation/Gait  Yes    Ambulation/Gait  Assistance  5: Supervision    Ambulation/Gait Assistance Details  emphasis on posture, core stabilization/activation and incr step length with gait    Ambulation Distance (Feet)  200 Feet   x1, plus in/out of/around gym   Assistive device  Rollator    Gait Pattern  Step-through pattern;Decreased stride length;Trunk flexed;Poor foot clearance - left;Narrow base of support    Ambulation Surface  Level;Indoor      Lumbar Exercises: Stretches   Passive Hamstring Stretch  2 reps;Right;Left;60 seconds;Limitations    Passive Hamstring Stretch Limitations  passive stretching on bil hamstrings by PTA for prolonged hold of 1-2 minutes each, 2 reps each side. increased stretch provided as pt relaxed    Single Knee to Chest Stretch  Right;Left;3 reps;30 seconds;Limitations    Single Knee to Chest Stretch Limitations  assist needed for form with cues for hold    Lower Trunk Rotation  3 reps;30 seconds;Limitations    Lower Trunk Rotation Limitations  assist to hold rotation with cues for breath and to stay within pain free ranges.       Lumbar Exercises: Supine   Bent Knee Raise  10 reps;3 seconds;Limitations    Bent Knee Raise Limitations  with abdominal bracing, alternating legs for 10 reps each side. cues to maintain abd bracing.     Bridge  Non-compliant;10 reps;5 seconds;Limitations    Bridge Limitations  cues for incr lift, hold time, breathing with movements      Knee/Hip Exercises: Aerobic   Nustep  Level 5 with UE/LE's with goal >/= 40 steps per minute for 8 minutes.           PT Short Term Goals - 12/29/18 1311      PT SHORT TERM GOAL #1   Title  Pt will be independent with HEP for improved balance, gait, strength.  TARGET 12/29/18    Time  5    Period  Weeks    Status  Achieved      PT SHORT TERM GOAL #2   Title  Pt will perform at least 4 of 5 reps of sit<>stand with UE support, no posterior LOB, for improved safety and efficiency with transfers.    Time  5    Period  Weeks     Status  Achieved      PT SHORT TERM GOAL #3   Title  Pt  will improve Berg Balance score to at least 23/56 for decreased fall risk.    Baseline  24/56 12/25/2018    Time  5    Period  Weeks    Status  Achieved      PT SHORT TERM GOAL #4   Title  Pt will improve TUG score to less than or equal to 20 seconds for decreased fall risk.    Baseline  15.81 sec 12/25/2018    Time  5    Period  Weeks    Status  Achieved        PT Long Term Goals - 02/09/19 2116      PT LONG TERM GOAL #1   Title  Pt will be independent with progression of HEP, to include low back flexibility, stability exercises to help reduce pain.  TARGET 03/09/2019    Time  4    Period  Weeks    Status  New    Target Date  03/09/19      PT LONG TERM GOAL #2   Title  Pt will improve gait velocity to at least 2.3 ft/sec for improved gait efficiency and safety.    Baseline  2.19 ft/sec 02/09/2019    Time  4    Period  Weeks    Status  On-going    Target Date  03/09/19      PT LONG TERM GOAL #3   Title  Pt will improve Berg Balance score to at least 38/56 for decreased fall risk.    Time  4    Period  Weeks    Status  Revised    Target Date  03/09/19      PT LONG TERM GOAL #4   Title  Pt will improve Oswestry Disability index by 10% for improved functional mobility.    Baseline  51% 02/09/2019    Time  4    Period  Weeks    Status  New    Target Date  03/09/19      PT LONG TERM GOAL #5   Title  Pt will verbalize plans for continued community fitness upon d/c from PT.    Time  4    Period  Weeks    Status  New    Target Date  03/09/19            Plan - 02/14/19 1537    Clinical Impression Statement  Today's skilled sessoin worked toward increased flexibility, core/LE strengthening and improved posture with gait. No increase in pain reported. The pt did have one episode of right hamstring cramping with left single knee to chest stretch, relieved with rest/leg extension. No other occurances of cramping  with stretching. The pt should benefit from continued PT to progress toward unmet goals.     Rehab Potential  Good    PT Frequency  2x / week    PT Duration  4 weeks   per recert 8/78/6767   PT Treatment/Interventions  ADLs/Self Care Home Management;Therapeutic exercise;Patient/family education;Therapeutic activities;Functional mobility training;Gait training;DME Instruction;Balance training;Neuromuscular re-education    PT Next Visit Plan  continue to address back/LE flexibility, strengthening as tolerated, balance reactions all towards updated LTGs    Consulted and Agree with Plan of Care  Patient       Patient will benefit from skilled therapeutic intervention in order to improve the following deficits and impairments:  Abnormal gait, Decreased balance, Decreased strength, Decreased mobility, Difficulty walking, Postural dysfunction  Visit Diagnosis: Unsteadiness on feet  Other abnormalities of gait and mobility  Muscle weakness (generalized)  Chronic midline low back pain, unspecified whether sciatica present     Problem List Patient Active Problem List   Diagnosis Date Noted  . Spinal stenosis of lumbar region without neurogenic claudication 02/08/2019  . Nephropathy, diabetic ([place]) 02/07/2019  . Gout 05/16/2018  . Gait instability 01/03/2018  . Frequent bowel movements 11/02/2017  . Tremor of both hands   . Stuttering   . Essential hypertension   . Hyperlipidemia 08/13/2015  . Breast cancer of lower-outer quadrant of right female breast ([place]) 02/13/2015  . Vitamin D deficiency 02/06/2015  . Type 2 diabetes mellitus with diabetic foot deformity ([facility]) 01/01/2014  . Chronic combined systolic and diastolic heart failure ([place]) 02/26/2013  . OBESITY 02/05/2009  . Asthma, intermittent 02/08/2008  . NEPHROLITHIASIS 01/26/2007  . DJD (degenerative joint disease), multiple sites 01/26/2007    Willow Ora, PTA, [facility] [address] [place], [place] [address] [phone] 02/14/19, 1:22 PM   Name: [name] MRN: [id number] Date of Birth: 26-May-1951

## 2019-02-16 ENCOUNTER — Ambulatory Visit: Payer: Medicare Other | Admitting: Physical Therapy

## 2019-02-19 ENCOUNTER — Ambulatory Visit: Payer: Medicare Other | Admitting: Physical Therapy

## 2019-02-20 ENCOUNTER — Telehealth: Payer: Self-pay | Admitting: Physical Therapy

## 2019-02-20 NOTE — Telephone Encounter (Signed)
[name] was contacted today regarding the temporary closing of [facility] due to Covid-19.  Therapist discussed:  Continuing HEP and walking at home while not being able to come into clinic.  Patient is not interested in further information for an e-visit, virtual check in, or telehealth visit, if those services become available.    [facility] will follow up with patients when we are able to resume care.  [name], [place] [address] [place], [place]  [address] Phone:  [phone] Fax:  [phone] \

## 2019-02-21 ENCOUNTER — Ambulatory Visit: Payer: Medicare Other | Admitting: Physical Therapy

## 2019-02-28 ENCOUNTER — Ambulatory Visit: Payer: Medicaid Other | Admitting: Physical Therapy

## 2019-03-01 ENCOUNTER — Telehealth: Payer: Self-pay | Admitting: Physical Therapy

## 2019-03-01 NOTE — Telephone Encounter (Signed)
[name] was contacted today regarding temporary reduction of [facility] due to concerns for community transmission of COVID-19.  Patient identity was verified.  Assessed if patient needed to be seen in person by clinician.   Patient did not have an acute/special need that requires in person visit. Proceeded with phone call.  Therapist advised the patient to continue to perform his/her HEP and assured he/she had no unanswered questions or concerns at this time.   The patient was offered and declined the continuation of their plan of care by using methods such as an E-Visit, virtual check in, or Telehealth visit.  (She would be interested if she could get internet set up and will talk to her son for this option.)  [facility] will follow up with this client when we are able to safely resume care at the [facility] in person.   Patient is aware we can be reached by telephone during limited business hours in the meantime.   [name], PT 03/01/19 3:01 PM Phone: [phone] Fax: [phone]

## 2019-03-02 ENCOUNTER — Ambulatory Visit: Payer: Medicaid Other | Admitting: Physical Therapy

## 2019-03-05 ENCOUNTER — Telehealth: Payer: Self-pay | Admitting: Hematology

## 2019-03-05 NOTE — Telephone Encounter (Signed)
Per 4/2 schedule message moved 4/10 lab/fu to July. Spoke with patient.

## 2019-03-06 ENCOUNTER — Ambulatory Visit: Payer: Medicaid Other | Admitting: Rehabilitation

## 2019-03-07 ENCOUNTER — Ambulatory Visit: Payer: Medicaid Other | Admitting: Physical Therapy

## 2019-03-08 ENCOUNTER — Other Ambulatory Visit: Payer: Self-pay | Admitting: Family Medicine

## 2019-03-08 ENCOUNTER — Other Ambulatory Visit: Payer: Self-pay | Admitting: Hematology

## 2019-03-08 DIAGNOSIS — Z170 Estrogen receptor positive status [ER+]: Principal | ICD-10-CM

## 2019-03-08 DIAGNOSIS — C50511 Malignant neoplasm of lower-outer quadrant of right female breast: Secondary | ICD-10-CM

## 2019-03-09 ENCOUNTER — Inpatient Hospital Stay: Payer: Medicaid Other | Admitting: Hematology

## 2019-03-09 ENCOUNTER — Inpatient Hospital Stay: Payer: Medicaid Other

## 2019-03-15 ENCOUNTER — Ambulatory Visit: Payer: Medicare Other | Admitting: Physical Therapy

## 2019-03-22 ENCOUNTER — Telehealth: Payer: Self-pay | Admitting: Physical Therapy

## 2019-03-22 NOTE — Telephone Encounter (Signed)
REHABCOVIDSCHEDULE: patient was contacted today regarding transition if in- person OP Rehab services to telehealth due to Covid-19.  Pt. Consented to telehealth services, educated on MyChart signup, [employer], and was agreeable to receive information via email regarding telehealth services. Pt. Consented and was scheduled for appointment.

## 2019-03-29 ENCOUNTER — Other Ambulatory Visit: Payer: Self-pay

## 2019-03-29 ENCOUNTER — Ambulatory Visit: Payer: Medicare Other | Attending: Family Medicine | Admitting: Physical Therapy

## 2019-03-29 ENCOUNTER — Encounter: Payer: Self-pay | Admitting: Physical Therapy

## 2019-03-29 DIAGNOSIS — R2681 Unsteadiness on feet: Secondary | ICD-10-CM | POA: Diagnosis not present

## 2019-03-29 DIAGNOSIS — M6281 Muscle weakness (generalized): Secondary | ICD-10-CM | POA: Diagnosis not present

## 2019-03-29 DIAGNOSIS — M545 Low back pain: Secondary | ICD-10-CM | POA: Insufficient documentation

## 2019-03-29 DIAGNOSIS — R2689 Other abnormalities of gait and mobility: Secondary | ICD-10-CM | POA: Diagnosis not present

## 2019-03-29 DIAGNOSIS — G8929 Other chronic pain: Secondary | ICD-10-CM | POA: Insufficient documentation

## 2019-03-29 NOTE — Patient Instructions (Signed)
Access Code: [email or web address]  URL: [email or web address]  Date: 03/29/2019  Prepared by: Misty Stanley   Exercises Seated Pelvic Tilt - 10 reps - 1 sets - 2x daily - 5x weekly Seated Sidebending Arms Overhead - 2 sets - 10 second hold - 2x daily - 5x weekly Seated Hamstring Stretch - 2 sets - 30 seconds hold - 2x daily - 5x weekly Side Stepping with Counter Support - 10 reps - 2 sets - 2x daily - 5x weekly Standing Marching - 10 reps - 2 sets - 2x daily - 5x weekly Single Leg Stance with Support - 4 sets - 10 seconds hold - 2x daily - 5x weekly Standing Tandem Balance with Counter Support - 4 sets - 15 seconds hold - 2x daily - 5x weekly Supine Single Knee to Chest - 4 sets - 10 seconds hold - 2x daily - 5x weekly Supine Lower Trunk Rotation - 10 reps - 1 sets - 2x daily - 5x weekly

## 2019-03-29 NOTE — Therapy (Signed)
[facility] [address] [place], [place], [address] Phone: [phone]   Fax:  [phone]  Physical Therapy Treatment  Patient Details  Name: [name] MRN: [id number] Date of Birth: [birth year] Referring Provider (PT): Talbert Cage, MD   Encounter Date: 03/29/2019   Physical Therapy Telehealth Visit:  I connected with Burnett D. Dalbert Batman today at 11:06am  by [employer] video conference and verified that I am speaking with the correct person using two identifiers.  I discussed the limitations, risks, security and privacy concerns of performing an evaluation and management service by Webex and the availability of in person appointments.   I also discussed with the patient that there may be a patient responsible charge related to this service. The patient expressed understanding and agreed to proceed.   The patient's address was confirmed.  Identified to the patient that therapist is a licensed physical therapist in the state of [place].  Verified phone # as [phone] to call in case of technical difficulties.   PT End of Session - 03/29/19 1534    Visit Number  17    Number of Visits  21    Date for PT Re-Evaluation  93/81/01   per recert on 7/51   Authorization Type  [employer]HC Medicare & Medicaid    PT Start Time  1106    PT Stop Time  1150    PT Time Calculation (min)  44 min    Activity Tolerance  Patient tolerated treatment well;No increased pain    Behavior During Therapy  WFL for tasks assessed/performed       Past Medical History:  Diagnosis Date  . Anemia   . Arthritis   . Asthma   . Breast cancer of lower-outer quadrant of right female breast ([place]) 02/13/2015  . Cataract   . CHF, acute ([place]) 05/03/2012  . Depression   . Diabetes mellitus   . Eczema   . Fatty liver    Fatty infiltration of liver noted on 03/2012 CT scan  . Fibromyalgia   . GERD (gastroesophageal reflux disease)   . Glaucoma   . Heart  disease   . History of kidney stones    w/ hx of hydronephrosis - followed by [facility]  . HIV nonspecific serology    2006: indeterminate HIV blood test, seen by ID, felt secondary to cross reacting antibodies with no further workup felt necessary at that time   . Hypertension   . Obesity   . Personal history of radiation therapy 2016   right  . Radiation 05/07/15-06/23/15   Right Breast    Past Surgical History:  Procedure Laterality Date  . BREAST BIOPSY Left 02/10/2015   malignant   . BREAST LUMPECTOMY Right 03/21/2015  . CHOLECYSTECTOMY  2003  . CYSTOSCOPY W/ LITHOLAPAXY / E[facility]    . JOINT REPLACEMENT     bilateral knee replacement  . LEFT AND RIGHT HEART CATHETERIZATION WITH CORONARY ANGIOGRAM N/A 05/02/2012   Procedure: LEFT AND RIGHT HEART CATHETERIZATION WITH CORONARY ANGIOGRAM;  Surgeon: Burnell Blanks, MD;  Location: [facility] CATH LAB;  Service: Cardiovascular;  Laterality: N/A;  . RADIOACTIVE SEED GUIDED PARTIAL MASTECTOMY WITH AXILLARY SENTINEL LYMPH NODE BIOPSY Right 03/21/2015   Procedure: RIGHT  PARTIAL MASTECTOMY WITH RADIOACTIVE SEED LOCALIZATION RIGHT  AXILLARY SENTINEL LYMPH NODE BIOPSY;  Surgeon: Fanny Skates, MD;  Location: [facility];  Service: General;  Laterality: Right;  . REPLACEMENT TOTAL KNEE BILATERAL  2005 &2006  . VASCULAR SURGERY  Right 03/15/2013   Ultrasound guided sclerotherapy    There were no vitals filed for this visit.  Subjective Assessment - 03/29/19 1501    Subjective  No issues to report, no falls.   Has been walking up/down her hallway with her rollator 6 times.  Pain in low back is still about the same - no better, no worse.  Has not been able to do supine exercises due to bed being too soft, has been able to continue stretches, seated and standing exercises.    Pertinent History  Breast cancer 2015:  Neuropathy; DM type 2, arthritis      Patient Stated Goals  Pt's goal for therapy is to get built back up to do my  normal things.    Pain Score  5     Pain Location  Back    Pain Orientation  Lower    Pain Descriptors / Indicators  Aching    Pain Type  Chronic pain    Pain Onset  More than a month ago         [facility] PT Assessment - 03/29/19 1124      Assessment   Medical Diagnosis  Gait instability    Referring Provider (PT)  Talbert Cage, MD    Onset Date/Surgical Date  09/24/18    Hand Dominance  Right      Precautions   Precautions  Fall      Balance Screen   Has the patient fallen in the past 6 months  No      Prior Function   Level of Independence  Independent with community mobility with device      Observation/Other Assessments   Focus on Therapeutic Outcomes (FOTO)   Not performed    Other Surveys   Oswestry Disability Index    Oswestry Disability Index   55% disability rating, severe disability.  No change from previous assessment      Berg Balance Test   Sit to Stand  Able to stand without using hands and stabilize independently    Standing Unsupported  Able to stand safely 2 minutes    Sitting with Back Unsupported but Feet Supported on Floor or Stool  Able to sit safely and securely 2 minutes    Stand to Sit  Sits safely with minimal use of hands    Transfers  Able to transfer safely, minor use of hands    Standing Unsupported with Eyes Closed  Able to stand 10 seconds with supervision    Standing Unsupported with Feet Together  Able to place feet together independently and stand for 1 minute with supervision    From Standing, Reach Forward with Outstretched Arm  Can reach confidently >25 cm (10")    From Standing Position, Pick up Object from Floor  Unable to pick up and needs supervision    From Standing Position, Turn to Look Behind Over each Shoulder  Looks behind from both sides and weight shifts well    Turn 360 Degrees  Needs assistance while turning    Standing Unsupported, Alternately Place Feet on Step/Stool  Able to complete >2 steps/needs minimal assist     Standing Unsupported, One Foot in Front  Needs help to step but can hold 15 seconds    Standing on One Leg  Unable to try or needs assist to prevent fall    Total Score  37    Berg comment:  37/56  [facility] Adult PT Treatment/Exercise - 03/29/19 1521      Exercises   Exercises  Lumbar      Lumbar Exercises: Stretches   Standing Side Bend  Right;Left;1 rep;20 seconds    Standing Side Bend Limitations  seated side bend in chair      Lumbar Exercises: Supine   Pelvic Tilt  10 reps    Pelvic Tilt Limitations  anterior/posterior in sitting       Access Code: [email or web address]  URL: [email or web address]  Date: 03/29/2019  Prepared by: Misty Stanley   Exercises Seated Pelvic Tilt - 10 reps - 1 sets - 2x daily - 5x weekly Seated Sidebending Arms Overhead - 2 sets - 10 second hold - 2x daily - 5x weekly Seated Hamstring Stretch - 2 sets - 30 seconds hold - 2x daily - 5x weekly Side Stepping with Counter Support - 10 reps - 2 sets - 2x daily - 5x weekly Standing Marching - 10 reps - 2 sets - 2x daily - 5x weekly Single Leg Stance with Support - 4 sets - 10 seconds hold - 2x daily - 5x weekly Standing Tandem Balance with Counter Support - 4 sets - 15 seconds hold - 2x daily - 5x weekly Supine Single Knee to Chest - 4 sets - 10 seconds hold - 2x daily - 5x weekly Supine Lower Trunk Rotation - 10 reps - 1 sets - 2x daily - 5x weekly       PT Education - 03/29/19 1532    Education provided  Yes    Education Details  removed supine bridges due to bed too soft, updated HEP, re-assessed balance and Oswestry, plan for renewal of visits x 4 weeks, increase laps in hallway to 8    Person(s) Educated  Patient    Methods  Explanation;Handout;Demonstration    Comprehension  Verbalized understanding;Returned demonstration       PT Short Term Goals - 03/29/19 1550      PT SHORT TERM GOAL #1   Title  = LTG        PT Long Term Goals - 03/29/19 1539       PT LONG TERM GOAL #1   Title  Pt will be independent with progression of HEP, to include low back flexibility, stability exercises to help reduce pain.      Time  4    Period  Weeks    Status  Revised    Target Date  04/28/19      PT LONG TERM GOAL #2   Title  Pt will improve gait velocity to at least 2.3 ft/sec with rollator for improved gait efficiency and safety.    Baseline  2.19 ft/sec 02/09/2019    Time  4    Period  Weeks    Status  On-going    Target Date  04/28/19      PT LONG TERM GOAL #3   Title  Pt will improve Berg Balance score to at least 40/56 for decreased fall risk.    Baseline  37/56    Time  4    Period  Weeks    Status  Revised    Target Date  04/28/19      PT LONG TERM GOAL #4   Title  Pt will improve Oswestry Disability index to </= 40% for improved functional mobility.    Baseline  51% 02/09/2019 > 55% on 4/30 (severe disability)    Time  4    Period  Weeks  Status  Revised    Target Date  04/28/19      PT LONG TERM GOAL #5   Title  Pt will report improved endurance with ambulation as evidenced by ability to ambulate 12 laps up/down her hallway at home with rollator    Baseline  6 laps currently    Time  4    Period  Weeks    Status  Revised    Target Date  04/28/19            Plan - 03/29/19 1540    Clinical Impression Statement  Treatment session today via telehealth focused on re-assessment of pt functional status to determine current functional level, impairments and plan for renewal of 4 more weeks of therapy.  Per BERG and Oswestry pt has maintained balance gains but pain and disability remain unchanged.  Focused on updating HEP to include more standing balance, core activation and ROM for lumbar spine.  Removed supine bridges due to bed being too soft to perform on.  Pt have PT permission to mail HEP to her home.  Pt continues to present with impairments in LE and core strength, impaired ROM, impaired balance, increased falls risk and  impaired gait.  Pt will benefit from ongoing skilled PT services to continue to address impairments, decrease risk for falls and maximize functional mobility independence.    Rehab Potential  Good    PT Frequency  1x / week    PT Duration  4 weeks    PT Treatment/Interventions  ADLs/Self Care Home Management;Therapeutic exercise;Patient/family education;Therapeutic activities;Functional mobility training;Gait training;DME Instruction;Balance training;Neuromuscular re-education;Aquatic Therapy;Cryotherapy;Moist Heat;Manual techniques;Passive range of motion    PT Next Visit Plan  Standing balance and standing endurance, core activation in sitting/standing.  How is she tolerating new exercises added to HEP?    Consulted and Agree with Plan of Care  Patient       Patient will benefit from skilled therapeutic intervention in order to improve the following deficits and impairments:  Abnormal gait, Decreased balance, Decreased strength, Decreased mobility, Difficulty walking, Postural dysfunction, Decreased activity tolerance, Decreased range of motion, Pain  Visit Diagnosis: Other abnormalities of gait and mobility  Unsteadiness on feet  Muscle weakness (generalized)  Chronic midline low back pain, unspecified whether sciatica present     Problem List Patient Active Problem List   Diagnosis Date Noted  . Spinal stenosis of lumbar region without neurogenic claudication 02/08/2019  . Nephropathy, diabetic ([place]) 02/07/2019  . Gout 05/16/2018  . Gait instability 01/03/2018  . Frequent bowel movements 11/02/2017  . Tremor of both hands   . Stuttering   . Essential hypertension   . Hyperlipidemia 08/13/2015  . Breast cancer of lower-outer quadrant of right female breast ([place]) 02/13/2015  . Vitamin D deficiency 02/06/2015  . Type 2 diabetes mellitus with diabetic foot deformity ([place]) 01/01/2014  . Chronic combined systolic and diastolic heart failure ([place]) 02/26/2013  . OBESITY 02/05/2009   . Asthma, intermittent 02/08/2008  . NEPHROLITHIASIS 01/26/2007  . DJD (degenerative joint disease), multiple sites 01/26/2007    Rico Junker, PT, DPT 03/29/19    3:56 PM    [facility] [address] [place], [place], [address] Phone: [phone]   Fax:  [phone]  Name: KEILYNN MARANO MRN: [id number] Date of Birth: 13-Mar-1951

## 2019-03-30 ENCOUNTER — Other Ambulatory Visit: Payer: Self-pay

## 2019-03-30 ENCOUNTER — Encounter: Payer: Self-pay | Admitting: Podiatry

## 2019-03-30 ENCOUNTER — Ambulatory Visit (INDEPENDENT_AMBULATORY_CARE_PROVIDER_SITE_OTHER): Payer: Medicare Other | Admitting: Podiatry

## 2019-03-30 VITALS — Temp 97.1°F

## 2019-03-30 DIAGNOSIS — B351 Tinea unguium: Secondary | ICD-10-CM | POA: Diagnosis not present

## 2019-03-30 DIAGNOSIS — E1140 Type 2 diabetes mellitus with diabetic neuropathy, unspecified: Secondary | ICD-10-CM

## 2019-03-30 DIAGNOSIS — M2010 Hallux valgus (acquired), unspecified foot: Secondary | ICD-10-CM

## 2019-03-30 NOTE — Progress Notes (Signed)
Patient ID: [name], female   DOB: Jan 20, 1951, 68 y.o.   MRN: [id number] Complaint:  Visit Type: Patient returns to the office for preventative foot care services.  Patient states nails are painful walking and wearing her shoes.  She is unable to self treat.  Patient is diabetic.   Podiatric Exam: Vascular: dorsalis pedis and posterior tibial pulses are palpable bilateral. Capillary return is immediate. Temperature gradient is WNL. Skin turgor WNL  Sensorium: Diminished Semmes Weinstein monofilament test. Normal tactile sensation bilaterally. Nail Exam: Pt has thick disfigured discolored nails second and third toenails left foot. Ulcer Exam: There is no evidence of ulcer or pre-ulcerative changes or infection. Orthopedic Exam: Muscle tone and strength are WNL. No limitations in general ROM. No crepitus or effusions noted. Foot type and digits show no abnormalities. . Asymptomatic HAV B/L.  Pes planus noted. Skin: No Porokeratosis. No infection or ulcers  Diagnosis:  Onychomycosis  Diabetes  HAV  B/L  Pes planus  B/L  Treatment & Plan Procedures and Treatment: Debridement and grinding of long painful nails both feet.Marland Kitchen Marland KitchenRTC 3 months.    Return Visit-Office Procedure: Patient instructed to return to the office for a follow up visit for scheduled preventive foot care services.   Gardiner Barefoot DPM

## 2019-04-05 ENCOUNTER — Encounter: Payer: Self-pay | Admitting: Physical Therapy

## 2019-04-05 ENCOUNTER — Ambulatory Visit: Payer: Medicare Other | Attending: Family Medicine | Admitting: Physical Therapy

## 2019-04-05 ENCOUNTER — Other Ambulatory Visit: Payer: Self-pay

## 2019-04-05 DIAGNOSIS — R2689 Other abnormalities of gait and mobility: Secondary | ICD-10-CM | POA: Diagnosis not present

## 2019-04-05 DIAGNOSIS — M6281 Muscle weakness (generalized): Secondary | ICD-10-CM | POA: Insufficient documentation

## 2019-04-05 DIAGNOSIS — M545 Low back pain: Secondary | ICD-10-CM | POA: Diagnosis not present

## 2019-04-05 DIAGNOSIS — R2681 Unsteadiness on feet: Secondary | ICD-10-CM | POA: Insufficient documentation

## 2019-04-05 DIAGNOSIS — G8929 Other chronic pain: Secondary | ICD-10-CM | POA: Diagnosis not present

## 2019-04-05 NOTE — Therapy (Signed)
[name] [address] [place], [place], [address] Phone: [phone]   Fax:  [phone]  Physical Therapy Treatment  Patient Details  Name: [name] MRN: [id number] Date of Birth: 10-Nov-1951 Referring Provider (PT): Talbert Cage, MD   Encounter Date: 04/05/2019  Physical Therapy Telehealth Visit:  I connected with [name] today at 1308 by Webex video conference and verified that I am speaking with the correct person using two identifiers.  I discussed the limitations, risks, security and privacy concerns of performing an evaluation and management service by Webex and the availability of in person appointments.  I also discussed with the patient that there may be a patient responsible charge related to this service. The patient expressed understanding and agreed to proceed.    The patient's address was confirmed.  Identified to the patient that therapist is a licensed physical therapist in the state of [place].  Verified phone # as [phone] to call in case of technical difficulties.     PT End of Session - 04/05/19 1554    Visit Number  18    Number of Visits  21    Date for PT Re-Evaluation  67/12/45   per recert on 8/09   Authorization Type  UHC Medicare & Medicaid    PT Start Time  9833    PT Stop Time  8250    PT Time Calculation (min)  40 min    Activity Tolerance  Patient tolerated treatment well;No increased pain    Behavior During Therapy  WFL for tasks assessed/performed       Past Medical History:  Diagnosis Date  . Anemia   . Arthritis   . Asthma   . Breast cancer of lower-outer quadrant of right female breast ([place]) 02/13/2015  . Cataract   . CHF, acute ([place]) 05/03/2012  . Depression   . Diabetes mellitus   . Eczema   . Fatty liver    Fatty infiltration of liver noted on 03/2012 CT scan  . Fibromyalgia   . GERD (gastroesophageal reflux disease)   . Glaucoma   . Heart disease    . History of kidney stones    w/ hx of hydronephrosis - followed by [facility]  . HIV nonspecific serology    2006: indeterminate HIV blood test, seen by ID, felt secondary to cross reacting antibodies with no further workup felt necessary at that time   . Hypertension   . Obesity   . Personal history of radiation therapy 2016   right  . Radiation 05/07/15-06/23/15   Right Breast    Past Surgical History:  Procedure Laterality Date  . BREAST BIOPSY Left 02/10/2015   malignant   . BREAST LUMPECTOMY Right 03/21/2015  . CHOLECYSTECTOMY  2003  . CYSTOSCOPY W/ LITHOLAPAXY / EHL    . JOINT REPLACEMENT     bilateral knee replacement  . LEFT AND RIGHT HEART CATHETERIZATION WITH CORONARY ANGIOGRAM N/A 05/02/2012   Procedure: LEFT AND RIGHT HEART CATHETERIZATION WITH CORONARY ANGIOGRAM;  Surgeon: Burnell Blanks, MD;  Location: [facility] C[facility] LAB;  Service: Cardiovascular;  Laterality: N/A;  . RADIOACTIVE SEED GUIDED PARTIAL MASTECTOMY WITH AXILLARY SENTINEL LYMPH NODE BIOPSY Right 03/21/2015   Procedure: RIGHT  PARTIAL MASTECTOMY WITH RADIOACTIVE SEED LOCALIZATION RIGHT  AXILLARY SENTINEL LYMPH NODE BIOPSY;  Surgeon: Fanny Skates, MD;  Location: [facility];  Service: General;  Laterality: Right;  . REPLACEMENT TOTAL KNEE BILATERAL  2005 &2006  . VASCULAR SURGERY Right  03/15/2013   Ultrasound guided sclerotherapy    There were no vitals filed for this visit.  Subjective Assessment - 04/05/19 1550    Subjective  No changes since last visit.  Received HEP packet in the mail late yesterday, so I haven't had a chance to work on it yet.  Have been working 6 laps in the house, usually in the mornings.    Pertinent History  Breast cancer 2015:  Neuropathy; DM type 2, arthritis      Patient Stated Goals  Pt's goal for therapy is to get built back up to do my normal things.    Currently in Pain?  Yes    Pain Score  5     Pain Location  Back    Pain Orientation  Lower    Pain  Descriptors / Indicators  Aching    Pain Type  Chronic pain    Pain Onset  More than a month ago    Pain Frequency  Constant    Aggravating Factors   standing for prolonged time    Pain Relieving Factors  warm shoer, cream, pain med    Pain Onset  More than a month ago         Reviewed HEP given last visit:   Access Code: [email or web address]     URL: [email or web address]       Seated Pelvic Tilt - 10 reps - 1 sets - 2x daily - 5x weekly Seated Sidebending Arms Overhead - 2 sets - 10 second hold - 2x daily - 5x weekly Seated Hamstring Stretch - 2 sets - 30 seconds hold - 2x daily - 5x weekly Side Stepping with Counter Support - 10 reps - 2 sets - 2x daily - 5x weekly (verbally reviewed) Standing Marching - 10 reps - 2 sets - 2x daily - 5x weekly Single Leg Stance with Support - 4 sets - 10 seconds hold - 2x daily - 5x weekly Standing Tandem Balance with Counter Support - 4 sets - 15 seconds hold - 2x daily - 5x weekly  Pt return demo the above exercises (using pictures from her HEP handout), appears independent with HEP.  Additional balance exercises:   -Standing at locked rollator walker:  Feet apart EO head turns, head nods x 5 reps; EC x 10 seconds; 2 sets      Feet together EO head turns, head nods x 5 reps; EC x 10 sec; 1 set  Practiced squats to simulate picking up objects from floor:  Cues for widened BOS with squat, using legs (avoid using lumbar flexion and extended knees), x 5 reps; 2nd set of 5 reps as minisquats with wide BOS and gentle knee/hip flexion  Encouraged pt to perform HEP above as noted, 5x/wk and to walk at least the 6-8 laps around rooms.  Encouraged pt to ambulate additional 2-3 laps at a second time of day to increase walking endurance, using rollator.  Pt verbalizes understanding.                      PT Education - 04/05/19 1554    Education provided  Yes    Education Details  Review of HEP; discussed increasing walking laps to  a second time of day, 2-3 laps    Person(s) Educated  Patient    Methods  Explanation;Demonstration    Comprehension  Verbalized understanding;Returned demonstration       PT Short Term Goals - 03/29/19 1550  PT SHORT TERM GOAL #1   Title  = LTG        PT Long Term Goals - 03/29/19 1539      PT LONG TERM GOAL #1   Title  Pt will be independent with progression of HEP, to include low back flexibility, stability exercises to help reduce pain.      Time  4    Period  Weeks    Status  Revised    Target Date  04/28/19      PT LONG TERM GOAL #2   Title  Pt will improve gait velocity to at least 2.3 ft/sec with rollator for improved gait efficiency and safety.    Baseline  2.19 ft/sec 02/09/2019    Time  4    Period  Weeks    Status  On-going    Target Date  04/28/19      PT LONG TERM GOAL #3   Title  Pt will improve Berg Balance score to at least 40/56 for decreased fall risk.    Baseline  37/56    Time  4    Period  Weeks    Status  Revised    Target Date  04/28/19      PT LONG TERM GOAL #4   Title  Pt will improve Oswestry Disability index to </= 40% for improved functional mobility.    Baseline  51% 02/09/2019 > 55% on 4/30 (severe disability)    Time  4    Period  Weeks    Status  Revised    Target Date  04/28/19      PT LONG TERM GOAL #5   Title  Pt will report improved endurance with ambulation as evidenced by ability to ambulate 12 laps up/down her hallway at home with rollator    Baseline  6 laps currently    Time  4    Period  Weeks    Status  Revised    Target Date  04/28/19            Plan - 04/05/19 1555    Clinical Impression Statement  Telehealth PT session provided today, with focus on review of HEP given last visit as well as additional standing balance and squatting activities.  Pt overall reports no falls, and appears motivated for continued therapy and exercise.  She will continue to benefit from skilled PT to further address balance,  functional strength and mobility.    Rehab Potential  Good    PT Frequency  1x / week    PT Duration  4 weeks    PT Treatment/Interventions  ADLs/Self Care Home Management;Therapeutic exercise;Patient/family education;Therapeutic activities;Functional mobility training;Gait training;DME Instruction;Balance training;Neuromuscular re-education;Aquatic Therapy;Cryotherapy;Moist Heat;Manual techniques;Passive range of motion    PT Next Visit Plan  Standing exercises with head turns/nods in varied foot positions for balance, core activation in sitting/standing; check on progression of gait    Consulted and Agree with Plan of Care  Patient       Patient will benefit from skilled therapeutic intervention in order to improve the following deficits and impairments:  Abnormal gait, Decreased balance, Decreased strength, Decreased mobility, Difficulty walking, Postural dysfunction, Decreased activity tolerance, Decreased range of motion, Pain  Visit Diagnosis: Unsteadiness on feet  Muscle weakness (generalized)  Other abnormalities of gait and mobility     Problem List Patient Active Problem List   Diagnosis Date Noted  . Spinal stenosis of lumbar region without neurogenic claudication 02/08/2019  . Nephropathy, diabetic ([place])  02/07/2019  . Gout 05/16/2018  . Gait instability 01/03/2018  . Frequent bowel movements 11/02/2017  . Tremor of both hands   . Stuttering   . Essential hypertension   . Hyperlipidemia 08/13/2015  . Breast cancer of lower-outer quadrant of right female breast ([place]) 02/13/2015  . Vitamin D deficiency 02/06/2015  . Type 2 diabetes mellitus with diabetic foot deformity ([place]) 01/01/2014  . Chronic combined systolic and diastolic heart failure ([place]) 02/26/2013  . OBESITY 02/05/2009  . Asthma, intermittent 02/08/2008  . NEPHROLITHIASIS 01/26/2007  . DJD (degenerative joint disease), multiple sites 01/26/2007    [place] Dost W. 04/05/2019, 4:00 PM Frazier Butt.,  PT [facility] [address] [place], [place], [address] Phone: [phone]   Fax:  [phone]  Name: CADDIE RANDLE MRN: [id number] Date of Birth: 20-Jul-1951

## 2019-04-06 ENCOUNTER — Other Ambulatory Visit: Payer: Self-pay

## 2019-04-06 ENCOUNTER — Ambulatory Visit (INDEPENDENT_AMBULATORY_CARE_PROVIDER_SITE_OTHER): Payer: Medicare Other | Admitting: Family Medicine

## 2019-04-06 ENCOUNTER — Encounter: Payer: Self-pay | Admitting: Family Medicine

## 2019-04-06 VITALS — BP 112/66 | HR 79

## 2019-04-06 DIAGNOSIS — I5042 Chronic combined systolic (congestive) and diastolic (congestive) heart failure: Secondary | ICD-10-CM

## 2019-04-06 DIAGNOSIS — I10 Essential (primary) hypertension: Secondary | ICD-10-CM

## 2019-04-06 DIAGNOSIS — M1000 Idiopathic gout, unspecified site: Secondary | ICD-10-CM | POA: Diagnosis not present

## 2019-04-06 DIAGNOSIS — M21969 Unspecified acquired deformity of unspecified lower leg: Secondary | ICD-10-CM | POA: Diagnosis not present

## 2019-04-06 DIAGNOSIS — E1169 Type 2 diabetes mellitus with other specified complication: Secondary | ICD-10-CM | POA: Diagnosis not present

## 2019-04-06 DIAGNOSIS — E7800 Pure hypercholesterolemia, unspecified: Secondary | ICD-10-CM

## 2019-04-06 LAB — POCT GLYCOSYLATED HEMOGLOBIN (HGB A1C): HbA1c, POC (controlled diabetic range): 10.1 % — AB (ref 0.0–7.0)

## 2019-04-06 MED ORDER — METFORMIN HCL ER 500 MG PO TB24: 500.0000 mg | ORAL_TABLET | Freq: Two times a day (BID) | ORAL | 0 refills | Status: DC

## 2019-04-06 NOTE — Assessment & Plan Note (Signed)
Not well controlled.  Increase metformin to twice a day and monitor for diarrhea.  Her diarrhea did not seem to increase with resuming metformin

## 2019-04-06 NOTE — Patient Instructions (Signed)
Good to see you today!  Thanks for coming in.  Cut back on Furosemide (Lasix) to one pill every other day  Increase metformin to 1 pill twice a day - let me know if your diarrhea is worsening  Cut back on sweets  Exercise outside   I will call you if your tests are not good.  Otherwise I will send you a letter.  If you do not hear from me with in 2 weeks please call our office.

## 2019-04-06 NOTE — Assessment & Plan Note (Signed)
Stable on allopurinol and colchicine - no recent flares

## 2019-04-06 NOTE — Assessment & Plan Note (Signed)
Stable.  Will decrease Lasix to every other day and monitor edema

## 2019-04-06 NOTE — Progress Notes (Signed)
Subjective  [name] is a 68 y.o. female is presenting with the following  HYPERTENSION Disease Monitoring: Blood pressure range-not checking Chest pain, palpitations- no      Dyspnea- no Medications: Compliance- Brings in all her meds  Lightheadedness,Syncope- no Is doing video Physical Therapy    Edema- stable.  Does not notice her legs swell more if she misses a lasix  DIABETES Disease Monitoring: Blood Sugar ranges-not checking Polyuria/phagia/dipsia- no      Visual problems- no Medications: Compliance- taking Jardiance and metformin daily.  Her diarrhea did not seem to increase with resuming metformin Hypoglycemic symptoms- no  HYPERLIPIDEMIA Disease Monitoring: See symptoms for Hypertension Medications: Compliance- daily Right upper quadrant pain- no  Muscle aches- no  Monitoring Labs and Parameters Last A1C:  Lab Results  Component Value Date   HGBA1C 10.1 (A) 04/06/2019    Last Lipid:     Component Value Date/Time   CHOL 229 (H) 01/07/2016 1635   HDL 39 (L) 01/07/2016 1635   LDLDIRECT 36 03/31/2016 1027    Last Bmet  Potassium  Date Value Ref Range Status  11/24/2018 4.6 3.5 - 5.2 mmol/L Final  08/03/2017 4.9 3.5 - 5.1 mEq/L Final   Sodium  Date Value Ref Range Status  11/24/2018 135 134 - 144 mmol/L Final  08/03/2017 141 136 - 145 mEq/L Final   Creatinine  Date Value Ref Range Status  09/07/2018 1.36 (H) 0.44 - 1.00 mg/dL Final  08/03/2017 1.5 (H) 0.6 - 1.1 mg/dL Final   Creatinine, Ser  Date Value Ref Range Status  11/24/2018 1.60 (H) 0.57 - 1.00 mg/dL Final      Last BPs:  BP Readings from Last 3 Encounters:  04/06/19 112/66  02/08/19 135/79  02/07/19 116/64     Chief Complaint noted Review of Symptoms - see HPI PMH - Smoking status noted.    Objective Vital Signs reviewed BP 112/66   Pulse 79   SpO2 99%  Psych:  Cognition and judgment appear intact. Alert, communicative  and cooperative with normal attention span and  concentration. No apparent delusions, illusions, hallucinations  Assessments/Plans  See after visit summary for details of patient instuctions  Chronic combined systolic and diastolic heart failure (HCC) Stable.  Will decrease Lasix to every other day and monitor edema  Essential hypertension BP Readings from Last 3 Encounters:  04/06/19 112/66  02/08/19 135/79  02/07/19 116/64   Stable good control   Gout Stable on allopurinol and colchicine - no recent flares   Hyperlipidemia Need to check lab next visit   Type 2 diabetes mellitus with diabetic foot deformity Not well controlled.  Increase metformin to twice a day and monitor for diarrhea.  Her diarrhea did not seem to increase with resuming metformin

## 2019-04-06 NOTE — Assessment & Plan Note (Signed)
BP Readings from Last 3 Encounters:  04/06/19 112/66  02/08/19 135/79  02/07/19 116/64   Stable good control

## 2019-04-06 NOTE — Assessment & Plan Note (Signed)
Need to check lab next visit

## 2019-04-07 LAB — BASIC METABOLIC PANEL
BUN/Creatinine Ratio: 25 (ref 12–28)
BUN: 32 mg/dL — ABNORMAL HIGH (ref 8–27)
CO2: 21 mmol/L (ref 20–29)
Calcium: 9.6 mg/dL (ref 8.7–10.3)
Chloride: 101 mmol/L (ref 96–106)
Creatinine, Ser: 1.29 mg/dL — ABNORMAL HIGH (ref 0.57–1.00)
GFR calc Af Amer: 48 mL/min/{1.73_m2} — ABNORMAL LOW (ref >59)
GFR calc non Af Amer: 42 mL/min/{1.73_m2} — ABNORMAL LOW (ref >59)
Glucose: 279 mg/dL — ABNORMAL HIGH (ref 65–99)
Potassium: 4.4 mmol/L (ref 3.5–5.2)
Sodium: 140 mmol/L (ref 134–144)

## 2019-04-12 ENCOUNTER — Other Ambulatory Visit: Payer: Self-pay

## 2019-04-12 ENCOUNTER — Ambulatory Visit: Payer: Medicare Other | Admitting: Physical Therapy

## 2019-04-12 ENCOUNTER — Encounter: Payer: Self-pay | Admitting: Physical Therapy

## 2019-04-12 DIAGNOSIS — R2681 Unsteadiness on feet: Secondary | ICD-10-CM

## 2019-04-12 DIAGNOSIS — R2689 Other abnormalities of gait and mobility: Secondary | ICD-10-CM | POA: Diagnosis not present

## 2019-04-12 DIAGNOSIS — G8929 Other chronic pain: Secondary | ICD-10-CM | POA: Diagnosis not present

## 2019-04-12 DIAGNOSIS — M545 Low back pain: Secondary | ICD-10-CM | POA: Diagnosis not present

## 2019-04-12 DIAGNOSIS — M6281 Muscle weakness (generalized): Secondary | ICD-10-CM

## 2019-04-12 NOTE — Therapy (Signed)
[facility] [address] [place], [place], [address] Phone: [phone]   Fax:  [phone]  Physical Therapy Treatment  Patient Details  Name: [name] MRN: [id number] Date of Birth: 08-23-1951 Referring Provider (PT): Talbert Cage, MD   Encounter Date: 04/12/2019  Physical Therapy Telehealth Visit:  I connected with Modena Nunnery today at 1200 by Webex video conference and verified that I am speaking with the correct person using two identifiers.  I discussed the limitations, risks, security and privacy concerns of performing an evaluation and management service by Webex and the availability of in person appointments.  I also discussed with the patient that there may be a patient responsible charge related to this service. The patient expressed understanding and agreed to proceed.    The patient's address was confirmed.  Identified to the patient that therapist is a licensed physical in the state of [place].  Verified phone # as [phone] to call in case of technical difficulties.    PT End of Session - 04/12/19 1227    Visit Number  19    Number of Visits  21    Date for PT Re-Evaluation  46/65/99   per recert on 3/57   Authorization Type  UHC Medicare & Medicaid    PT Start Time  1203    PT Stop Time  1248   Pt stepped away for bathroom x 5 minutes   PT Time Calculation (min)  45 min    Activity Tolerance  Patient tolerated treatment well    Behavior During Therapy  [facility] for tasks assessed/performed       Past Medical History:  Diagnosis Date  . Anemia   . Arthritis   . Asthma   . Breast cancer of lower-outer quadrant of right female breast ([place]) 02/13/2015  . Cataract   . CHF, acute ([place]) 05/03/2012  . Depression   . Diabetes mellitus   . Eczema   . Fatty liver    Fatty infiltration of liver noted on 03/2012 CT scan  . Fibromyalgia   . GERD (gastroesophageal reflux disease)   . Glaucoma   .  Heart disease   . History of kidney stones    w/ hx of hydronephrosis - followed by [facility]  . HIV nonspecific serology    2006: indeterminate HIV blood test, seen by ID, felt secondary to cross reacting antibodies with no further workup felt necessary at that time   . Hypertension   . Obesity   . Personal history of radiation therapy 2016   right  . Radiation 05/07/15-06/23/15   Right Breast    Past Surgical History:  Procedure Laterality Date  . BREAST BIOPSY Left 02/10/2015   malignant   . BREAST LUMPECTOMY Right 03/21/2015  . CHOLECYSTECTOMY  2003  . CYSTOSCOPY W/ LITHOLAPAXY / EHL    . JOINT REPLACEMENT     bilateral knee replacement  . LEFT AND RIGHT HEART CATHETERIZATION WITH CORONARY ANGIOGRAM N/A 05/02/2012   Procedure: LEFT AND RIGHT HEART CATHETERIZATION WITH CORONARY ANGIOGRAM;  Surgeon: Burnell Blanks, MD;  Location: [facility] CATH LAB;  Service: Cardiovascular;  Laterality: N/A;  . RADIOACTIVE SEED GUIDED PARTIAL MASTECTOMY WITH AXILLARY SENTINEL LYMPH NODE BIOPSY Right 03/21/2015   Procedure: RIGHT  PARTIAL MASTECTOMY WITH RADIOACTIVE SEED LOCALIZATION RIGHT  AXILLARY SENTINEL LYMPH NODE BIOPSY;  Surgeon: Fanny Skates, MD;  Location: [facility];  Service: General;  Laterality: Right;  . REPLACEMENT TOTAL KNEE BILATERAL  2005 &2006  .  VASCULAR SURGERY Right 03/15/2013   Ultrasound guided sclerotherapy    There were no vitals filed for this visit.  Subjective Assessment - 04/12/19 1205    Subjective  No changes since last visit.  Been doing HEP every other day.  Walking 7 laps at home in the early part of the day.    Pertinent History  Breast cancer 2015:  Neuropathy; DM type 2, arthritis      Patient Stated Goals  Pt's goal for therapy is to get built back up to do my normal things.    Currently in Pain?  Yes    Pain Score  6     Pain Location  Back    Pain Orientation  Lower    Pain Descriptors / Indicators  Aching    Pain Onset  More than  a month ago    Pain Frequency  Constant    Aggravating Factors   standing for prolonged time    Pain Relieving Factors  warm shower, cream, pain med    Pain Onset  More than a month ago                       [facility] Adult PT Treatment/Exercise - 04/12/19 0001      Lumbar Exercises: Seated   Other Seated Lumbar Exercises  Seated core stability exercises:  anterior/posterior pelvic tilts 2 sets x 10 reps, alternating UE lifts 2 x 5 reps, bilat UE lift 2 sets x 5 reps, gentle trunk rotation with bilat UE 2 x 5 reps.  Cues each set for abdominal activation and to lessen trunk sway    Other Seated Lumbar Exercises  Seated core stability exercises with marching in place x 10 reps, then march with opposite arm lift x 5 reps, march with opposite knee tap x 5 reps; LAQ with opposite knee taps x 5 reps.  Cues between each set for abdominal activation and to lessen trunk sway.       Pt takes brief seated rest breaks between core stability exercises.  Cues to maintain decreased trunk sway.   Balance Exercises - 04/12/19 1253      Balance Exercises: Standing   Standing Eyes Opened  Wide (BOA);Narrow base of support (BOS);Solid surface;Head turns;5 reps   Head nods   Standing Eyes Closed  Wide (BOA);Narrow base of support (BOS);Solid surface;Head turns;5 reps   Head nods   Stepping Strategy  Anterior;Lateral;Posterior;UE support;10 reps   At locked walker, then sequence ant, side, posterio x 5 reps   Partial Tandem Stance  Eyes open;Upper extremity support 2;5 reps   Head turns/nods, then rocking forward/back x 10 reps   Other Standing Exercises  Lateral weightshifting x 10 reps at locked walker          PT Short Term Goals - 03/29/19 1550      PT SHORT TERM GOAL #1   Title  = LTG        PT Long Term Goals - 03/29/19 1539      PT LONG TERM GOAL #1   Title  Pt will be independent with progression of HEP, to include low back flexibility, stability exercises to help reduce  pain.      Time  4    Period  Weeks    Status  Revised    Target Date  04/28/19      PT LONG TERM GOAL #2   Title  Pt will improve gait velocity to at least  2.3 ft/sec with rollator for improved gait efficiency and safety.    Baseline  2.19 ft/sec 02/09/2019    Time  4    Period  Weeks    Status  On-going    Target Date  04/28/19      PT LONG TERM GOAL #3   Title  Pt will improve Berg Balance score to at least 40/56 for decreased fall risk.    Baseline  37/56    Time  4    Period  Weeks    Status  Revised    Target Date  04/28/19      PT LONG TERM GOAL #4   Title  Pt will improve Oswestry Disability index to </= 40% for improved functional mobility.    Baseline  51% 02/09/2019 > 55% on 4/30 (severe disability)    Time  4    Period  Weeks    Status  Revised    Target Date  04/28/19      PT LONG TERM GOAL #5   Title  Pt will report improved endurance with ambulation as evidenced by ability to ambulate 12 laps up/down her hallway at home with rollator    Baseline  6 laps currently    Time  4    Period  Weeks    Status  Revised    Target Date  04/28/19            Plan - 04/12/19 1424    Clinical Impression Statement  Telehealth PT session provided today, with focus on seated core stability exercises as well as standing balance activiites at locked walker.  She requires UE support for balance activities in standing, but overall is more stable than when shewas being seen initially in clinic.  She continues to benefit from skilled PT to further address balance, core strengthening and mobility.    Rehab Potential  Good    PT Frequency  1x / week    PT Duration  4 weeks    PT Treatment/Interventions  ADLs/Self Care Home Management;Therapeutic exercise;Patient/family education;Therapeutic activities;Functional mobility training;Gait training;DME Instruction;Balance training;Neuromuscular re-education;Aquatic Therapy;Cryotherapy;Moist Heat;Manual techniques;Passive range of  motion    PT Next Visit Plan  Standing exercises with head turns/nods in varied foot positions for balance, core activation in sitting/standing; check on progression of gait, begin looking at STGs and preparing for d/c    Consulted and Agree with Plan of Care  Patient       Patient will benefit from skilled therapeutic intervention in order to improve the following deficits and impairments:  Abnormal gait, Decreased balance, Decreased strength, Decreased mobility, Difficulty walking, Postural dysfunction, Decreased activity tolerance, Decreased range of motion, Pain  Visit Diagnosis: Muscle weakness (generalized)  Unsteadiness on feet     Problem List Patient Active Problem List   Diagnosis Date Noted  . Spinal stenosis of lumbar region without neurogenic claudication 02/08/2019  . Nephropathy, diabetic ([place]) 02/07/2019  . Gout 05/16/2018  . Gait instability 01/03/2018  . Frequent bowel movements 11/02/2017  . Tremor of both hands   . Stuttering   . Essential hypertension   . Hyperlipidemia 08/13/2015  . Breast cancer of lower-outer quadrant of right female breast ([place]) 02/13/2015  . Vitamin D deficiency 02/06/2015  . Type 2 diabetes mellitus with diabetic foot deformity ([place]) 01/01/2014  . Chronic combined systolic and diastolic heart failure ([place]) 02/26/2013  . OBESITY 02/05/2009  . Asthma, intermittent 02/08/2008  . NEPHROLITHIASIS 01/26/2007  . DJD (degenerative joint disease), multiple sites 01/26/2007  Ladasha Schnackenberg W. 04/12/2019, 2:39 PM Frazier Butt., PT  [facility] [address] [place], [place], [address] Phone: [phone]   Fax:  [phone]  Name: JUNE RODE MRN: [id number] Date of Birth: [birth year]

## 2019-04-19 ENCOUNTER — Ambulatory Visit: Payer: Medicare Other | Admitting: Physical Therapy

## 2019-04-19 ENCOUNTER — Ambulatory Visit (INDEPENDENT_AMBULATORY_CARE_PROVIDER_SITE_OTHER): Payer: Medicare Other

## 2019-04-19 ENCOUNTER — Other Ambulatory Visit: Payer: Self-pay

## 2019-04-19 VITALS — BP 122/74 | HR 72

## 2019-04-19 DIAGNOSIS — M6281 Muscle weakness (generalized): Secondary | ICD-10-CM | POA: Diagnosis not present

## 2019-04-19 DIAGNOSIS — G8929 Other chronic pain: Secondary | ICD-10-CM

## 2019-04-19 DIAGNOSIS — R2681 Unsteadiness on feet: Secondary | ICD-10-CM

## 2019-04-19 DIAGNOSIS — Z0000 Encounter for general adult medical examination without abnormal findings: Secondary | ICD-10-CM

## 2019-04-19 DIAGNOSIS — M545 Low back pain, unspecified: Secondary | ICD-10-CM

## 2019-04-19 DIAGNOSIS — R2689 Other abnormalities of gait and mobility: Secondary | ICD-10-CM | POA: Diagnosis not present

## 2019-04-19 NOTE — Therapy (Signed)
[facility] [address] [place], [place], [address] Phone: [phone]   Fax:  [phone]  Physical Therapy Treatment  Patient Details  Name: [name] MRN: [id number] Date of Birth: 07-Dec-1950 Referring Provider (PT): Talbert Cage, MD   Encounter Date: 04/19/2019  Physical Therapy Telehealth Visit:  I connected with [name] today at 1100 by Webex video conference and verified that I am speaking with the correct person using two identifiers.  I discussed the limitations, risks, security and privacy concerns of performing an evaluation and management service by Webex and the availability of in person appointments.  I also discussed with the patient that there may be a patient responsible charge related to this service. The patient expressed understanding and agreed to proceed.    The patient's address was confirmed.  Identified to the patient that therapist is a licensed physical therapist in the state of [place].  Verified phone # as [phone] to call in case of technical difficulties.    PT End of Session - 04/19/19 1315    Visit Number  20    Number of Visits  21    Date for PT Re-Evaluation  93/81/01   per recert on 7/51   Authorization Type  [employer] Medicare & Medicaid    PT Start Time  1105    PT Stop Time  1146    PT Time Calculation (min)  41 min    Activity Tolerance  Patient tolerated treatment well    Behavior During Therapy  WFL for tasks assessed/performed       Past Medical History:  Diagnosis Date  . Anemia   . Arthritis   . Asthma   . Blood transfusion without reported diagnosis    Patients believes 2015 when she overdosed on "medications"   . Breast cancer of lower-outer quadrant of right female breast ([place]) 02/13/2015  . Cataract   . CHF, acute ([facility]) 05/03/2012  . Depression   . Diabetes mellitus   . Eczema   . Fatty liver    Fatty infiltration of liver noted on 03/2012 CT scan   . Fibromyalgia   . Glaucoma   . Heart disease   . History of kidney stones    w/ hx of hydronephrosis - followed by [facility]  . HIV nonspecific serology    2006: indeterminate HIV blood test, seen by ID, felt secondary to cross reacting antibodies with no further workup felt necessary at that time   . Hypertension   . Obesity   . Personal history of radiation therapy 2016   right  . Radiation 05/07/15-06/23/15   Right Breast    Past Surgical History:  Procedure Laterality Date  . BREAST BIOPSY Left 02/10/2015   malignant   . BREAST LUMPECTOMY Right 03/21/2015  . CHOLECYSTECTOMY  2003  . CYSTOSCOPY W/ LITHOLAPAXY / EHL    . JOINT REPLACEMENT     bilateral knee replacement  . LEFT AND RIGHT HEART CATHETERIZATION WITH CORONARY ANGIOGRAM N/A 05/02/2012   Procedure: LEFT AND RIGHT HEART CATHETERIZATION WITH CORONARY ANGIOGRAM;  Surgeon: Burnell Blanks, MD;  Location: [facility] CATH LAB;  Service: Cardiovascular;  Laterality: N/A;  . RADIOACTIVE SEED GUIDED PARTIAL MASTECTOMY WITH AXILLARY SENTINEL LYMPH NODE BIOPSY Right 03/21/2015   Procedure: RIGHT  PARTIAL MASTECTOMY WITH RADIOACTIVE SEED LOCALIZATION RIGHT  AXILLARY SENTINEL LYMPH NODE BIOPSY;  Surgeon: Fanny Skates, MD;  Location: [facility];  Service: General;  Laterality: Right;  . REPLACEMENT TOTAL KNEE  BILATERAL  2005 &2006  . VASCULAR SURGERY Right 03/15/2013   Ultrasound guided sclerotherapy    There were no vitals filed for this visit.  Subjective Assessment - 04/19/19 1109    Subjective  Doing so-so today.  Walking 10 laps, even doing some laps in the evening.    Pertinent History  Breast cancer 2015:  Neuropathy; DM type 2, arthritis      Patient Stated Goals  Pt's goal for therapy is to get built back up to do my normal things.    Currently in Pain?  Yes    Pain Score  9     Pain Location  Back    Pain Orientation  Lower    Pain Descriptors / Indicators  Aching    Pain Type  Chronic pain     Pain Onset  More than a month ago    Pain Frequency  Constant    Pain Onset  More than a month ago         [facility] PT Assessment - 04/19/19 0001      Observation/Other Assessments   Other Surveys   Oswestry Disability Index    Oswestry Disability Index   40% disability rating; moderate disability (improved from 55% rating-severe disability 03/29/2019)      Berg Balance Test   Sit to Stand  Able to stand without using hands and stabilize independently    Standing Unsupported  Able to stand safely 2 minutes    Sitting with Back Unsupported but Feet Supported on Floor or Stool  Able to sit safely and securely 2 minutes    Stand to Sit  Sits safely with minimal use of hands    Transfers  Able to transfer safely, minor use of hands    Standing Unsupported with Eyes Closed  Able to stand 10 seconds safely    Standing Unsupported with Feet Together  Needs help to attain position but able to stand for 30 seconds with feet together    From Standing, Reach Forward with Outstretched Arm  Can reach forward >12 cm safely (5")    From Standing Position, Pick up Object from Floor  Able to pick up shoe safely and easily    From Standing Position, Turn to Look Behind Over each Shoulder  Looks behind from both sides and weight shifts well    Turn 360 Degrees  Able to turn 360 degrees safely but slowly    Standing Unsupported, Alternately Place Feet on Step/Stool  Needs assistance to keep from falling or unable to try   cannot attempt via telehealth   Standing Unsupported, One Foot in Front  Able to plae foot ahead of the other independently and hold 30 seconds    Standing on One Leg  Tries to lift leg/unable to hold 3 seconds but remains standing independently    Total Score  42    Berg comment:  42/56   Improved from 37/56                  [facility] Adult PT Treatment/Exercise - 04/19/19 0001      Ambulation/Gait   Ambulation/Gait  Yes    Ambulation/Gait Assistance  6: Modified independent  (Device/Increase time)    Ambulation Distance (Feet)  200 Feet   approximately-at least 3 laps in home   Assistive device  Rollator    Gait Pattern  Step-through pattern;Decreased stride length;Narrow base of support;Trunk flexed    Ambulation Surface  Level;Indoor    Gait velocity  --  unable to assess in home distance/time via telehealth   Gait Comments  Attempted to time gait velocity, but due to poor lighting and pt not able to accurately judge or give distance over which to measure time, PT unable to assess final gait velocity score.      Self-Care   Self-Care  Other Self-Care Comments    Other Self-Care Comments   Discussed POC, progress towards goals, plans to check goals and discharge this visit.  Discussed overall progress since initial PT visit, importance of continued exercise and walking program, importance of continued use of rollator for safety.  Educated on when/if appropriate to ask MD for referral to PT in future.  PT read out Oswestry questions and scored via telehealth based on pt's answers.  Provided update on improvement regarding back pain/disability rating based on Oswestry score.             PT Education - 04/19/19 1314    Education provided  Yes    Education Details  Oswestry, Merrilee Jansky results, progress towards goals, plans for d/c this visit    Person(s) Educated  Patient    Methods  Explanation    Comprehension  Verbalized understanding       PT Short Term Goals - 03/29/19 1550      PT SHORT TERM GOAL #1   Title  = LTG        PT Long Term Goals - 04/19/19 1111      PT LONG TERM GOAL #1   Title  Pt will be independent with progression of HEP, to include low back flexibility, stability exercises to help reduce pain.      Time  4    Period  Weeks    Status  Achieved      PT LONG TERM GOAL #2   Title  Pt will improve gait velocity to at least 2.3 ft/sec with rollator for improved gait efficiency and safety.    Baseline  2.19 ft/sec 02/09/2019     Time  4    Period  Weeks    Status  Deferred   unable to accurately assess in home     PT LONG TERM GOAL #3   Title  Pt will improve Berg Balance score to at least 40/56 for decreased fall risk.    Baseline  37/56; 42/56 04/19/2019    Time  4    Period  Weeks    Status  Achieved      PT LONG TERM GOAL #4   Title  Pt will improve Oswestry Disability index to </= 40% for improved functional mobility.    Baseline  51% 02/09/2019 > 55% on 4/30 (severe disability)    Time  4    Period  Weeks    Status  Achieved      PT LONG TERM GOAL #5   Title  Pt will report improved endurance with ambulation as evidenced by ability to ambulate 12 laps up/down her hallway at home with rollator    Baseline  6 laps currently; 10 laps 04/19/2019    Time  4    Period  Weeks    Status  Partially Met            Plan - 04/19/19 1315    Clinical Impression Statement  Telehealth PT session provided today; checked LTGs this session, with pt meeting 3 of 5 LTGs.  Pt has met LTG 1 for independence with HEP progression, LTG 3 for improved Berg, LTG  4 for improve Oswestry score.  LTG 2 deferred, not able to accurately assess gait velocity in home via telehealth.  LTG 5 partially met, for improved gait endurance-pt is ambulating 10 laps in home versus 6 at last goal check.  Pt continues to have back pain limiting additional activities; however, during the course of PT, pt has made signficand gains in balance and gait endurance.  Discussed progress with goals; pt appears appropriate for d/c this visit and is in agreement.    Rehab Potential  Good    PT Frequency  1x / week    PT Duration  4 weeks    PT Treatment/Interventions  ADLs/Self Care Home Management;Therapeutic exercise;Patient/family education;Therapeutic activities;Functional mobility training;Gait training;DME Instruction;Balance training;Neuromuscular re-education;Aquatic Therapy;Cryotherapy;Moist Heat;Manual techniques;Passive range of motion    PT Next  Visit Plan  Discharge this visit.    Consulted and Agree with Plan of Care  Patient       Patient will benefit from skilled therapeutic intervention in order to improve the following deficits and impairments:  Abnormal gait, Decreased balance, Decreased strength, Decreased mobility, Difficulty walking, Postural dysfunction, Decreased activity tolerance, Decreased range of motion, Pain  Visit Diagnosis: Unsteadiness on feet  Muscle weakness (generalized)  Other abnormalities of gait and mobility  Chronic midline low back pain, unspecified whether sciatica present     Problem List Patient Active Problem List   Diagnosis Date Noted  . Spinal stenosis of lumbar region without neurogenic claudication 02/08/2019  . Nephropathy, diabetic ([place]) 02/07/2019  . Gout 05/16/2018  . Gait instability 01/03/2018  . Frequent bowel movements 11/02/2017  . Tremor of both hands   . Stuttering   . Essential hypertension   . Hyperlipidemia 08/13/2015  . Breast cancer of lower-outer quadrant of right female breast ([place]) 02/13/2015  . Vitamin D deficiency 02/06/2015  . Type 2 diabetes mellitus with diabetic foot deformity ([facility]) 01/01/2014  . Chronic combined systolic and diastolic heart failure ([place]) 02/26/2013  . OBESITY 02/05/2009  . Asthma, intermittent 02/08/2008  . NEPHROLITHIASIS 01/26/2007  . DJD (degenerative joint disease), multiple sites 01/26/2007    [name] W. 04/19/2019, 3:05 PM  Frazier Butt., PT   [facility] [address] [place], [place], [address] Phone: [phone]   Fax:  [phone]  Name: [name] MRN: [id number] Date of Birth: 16-May-1951   PHYSICAL THERAPY DISCHARGE SUMMARY  Visits from Start of Care: 20  Current functional level related to goals / functional outcomes: PT Long Term Goals - 04/19/19 1111      PT LONG TERM GOAL #1   Title  Pt will be independent with progression of HEP,  to include low back flexibility, stability exercises to help reduce pain.      Time  4    Period  Weeks    Status  Achieved      PT LONG TERM GOAL #2   Title  Pt will improve gait velocity to at least 2.3 ft/sec with rollator for improved gait efficiency and safety.    Baseline  2.19 ft/sec 02/09/2019    Time  4    Period  Weeks    Status  Deferred   unable to accurately assess in home     PT LONG TERM GOAL #3   Title  Pt will improve Berg Balance score to at least 40/56 for decreased fall risk.    Baseline  37/56; 42/56 04/19/2019    Time  4    Period  Weeks  Status  Achieved      PT LONG TERM GOAL #4   Title  Pt will improve Oswestry Disability index to </= 40% for improved functional mobility.    Baseline  51% 02/09/2019 > 55% on 4/30 (severe disability)    Time  4    Period  Weeks    Status  Achieved      PT LONG TERM GOAL #5   Title  Pt will report improved endurance with ambulation as evidenced by ability to ambulate 12 laps up/down her hallway at home with rollator    Baseline  6 laps currently; 10 laps 04/19/2019    Time  4    Period  Weeks    Status  Partially Met      Pt has met 3 of 5 long term goals.  She has improved balance and functional mobility measures, and decreased disability score on Oswestry Disability Index.   Remaining deficits: Balance (improving, but still at fall risk-pt uses rollator at all times); back pain (longstanding)   Education / Equipment: Educated HEP, fall prevention, walking program for home.  Plan: Patient agrees to discharge.  Patient goals were partially met. Patient is being discharged due to being pleased with the current functional level.  ?????  [name], PT 04/19/19 3:07 PM Phone: [phone] Fax: [phone]

## 2019-04-19 NOTE — Progress Notes (Signed)
.  driv

## 2019-04-19 NOTE — Patient Instructions (Addendum)
Thank you for talking with me today about your health and wellness goals!   You spoke to Dorna Bloom, [place] over the phone for your annual wellness visit.  We also discussed recommended health maintenance. As discussed, you are up to date with everything!  Health Maintenance  Topic Date Due  . INFLUENZA VACCINE  06/30/2019  . OPHTHALMOLOGY EXAM  08/30/2019  . HEMOGLOBIN A1C  10/07/2019  . FOOT EXAM  03/29/2020  . MAMMOGRAM  02/05/2021  . COLONOSCOPY  10/03/2021  . TETANUS/TDAP  09/23/2028  . DEXA SCAN  Completed  . Hepatitis C Screening  Completed  . PNA vac Low Risk Adult  Completed  . DTaP/Tdap/Td  Discontinued    We also discussed you would like to get your A1C down to less than 7.0. I have attached some educational materials for you to read over about diabetes nutrition. Please schedule your next diabetes exam with PCP in about 3 months.    Diet Recommendations for Diabetes   1. Eat at least 3 meals and 1-2 snacks per day. Never go more than 4-5 hours while awake without eating. Eat breakfast within the first hour of getting up.   2. Limit starchy foods to TWO per meal and ONE per snack. ONE portion of a starchy  food is equal to the following:   - ONE slice of bread (or its equivalent, such as half of a hamburger bun).   - 1/2 cup of a "scoopable" starchy food such as potatoes or rice.   - 15 grams of Total Carbohydrate as shown on food label.  3. Include at every meal: a protein food, a carb food, and vegetables and/or fruit.   - Obtain twice the volume of vegetables as protein or carbohydrate foods for both lunch and dinner.   - Fresh or frozen vegetables are best.   - Keep frozen vegetables on hand for a quick vegetable serving.       Starchy (carb) foods: Bread, rice, pasta, potatoes, corn, cereal, grits, crackers, bagels, muffins, all baked goods.  (Fruits, milk, and yogurt also have carbohydrate, but most of these foods will not spike your blood sugar as most starchy  foods will.)  A few fruits do cause high blood sugars; use small portions of bananas (limit to 1/2 at a time), grapes, watermelon, oranges, and most tropical fruits.    Protein foods: Meat, fish, poultry, eggs, dairy foods, and beans such as pinto and kidney beans (beans also provide carbohydrate).   Here is an example of what a healthy plate looks like:    ? Make half your plate fruits and vegetables.     ? Focus on whole fruits.     ? Vary your veggies.  ? Make half your grains whole grains. -     ? Look for the word "whole" at the beginning of the ingredients list    ? Some whole-grain ingredients include whole oats, whole-wheat flour,        whole-grain corn, whole-grain brown rice, and whole rye.  ? Move to low-fat and fat-free milk or yogurt.  ? Vary your protein routine. - Meat, fish, poultry (chicken, [place]), eggs, beans (kidney, pinto), dairy.  ? Drink and eat less sodium, saturated fat, and added sugars.      Our clinic's number is [phone]. Please call with questions or concerns about what we discussed today.

## 2019-04-19 NOTE — Progress Notes (Addendum)
Subjective:   [name] is a 68 y.o. female who presents for Medicare Annual (Subsequent) preventive examination.  The patient consented to a virtual visit.  Review of Systems:  Physical assessment deferred to PCP.  Cardiac Risk Factors include: advanced age (>75men, >22 women);diabetes mellitus     Objective:     Vitals: This visit was virtual. Patient stated her BP this am at home 122/74 HR: 72  Advanced Directives 04/19/2019 04/19/2019 04/06/2019 02/07/2019 11/28/2018 11/14/2018 08/29/2018  Does Patient Have a Medical Advance Directive? Yes Yes No No Yes No No  Type of Advance Directive - [place]  Does patient want to make changes to medical advance directive? - No - Patient declined - - - - -  Copy of [place] in Chart? - Yes - validated most recent copy scanned in chart (See row information) - - - - -  Would patient like information on creating a medical advance directive? - No - Patient declined No - Patient declined No - Patient declined - No - Patient declined No - Patient declined    Tobacco Social History   Tobacco Use  Smoking Status Never Smoker  Smokeless Tobacco Never Used      Clinical Intake:  Pre-visit preparation completed: Yes  Pain : 0-10 Pain Score: 8  Pain Type: Chronic pain   How often do you need to have someone help you when you read instructions, pamphlets, or other written materials from your doctor or pharmacy?: 1 - Never What is the last grade level you completed in school?: Associates Degree   Interpreter Needed?: No     Past Medical History:  Diagnosis Date  . Anemia   . Arthritis   . Asthma   . Blood transfusion without reported diagnosis    Patients believes 2015 when she overdosed on "medications"   . Breast cancer of lower-outer quadrant of right female breast ([facility]) 02/13/2015  . Cataract   . CHF, acute ([place]) 05/03/2012  . Depression   . Diabetes  mellitus   . Eczema   . Fatty liver    Fatty infiltration of liver noted on 03/2012 CT scan  . Fibromyalgia   . Glaucoma   . Heart disease   . History of kidney stones    w/ hx of hydronephrosis - followed by [facility]  . HIV nonspecific serology    2006: indeterminate HIV blood test, seen by ID, felt secondary to cross reacting antibodies with no further workup felt necessary at that time   . Hypertension   . Obesity   . Personal history of radiation therapy 2016   right  . Radiation 05/07/15-06/23/15   Right Breast   Past Surgical History:  Procedure Laterality Date  . BREAST BIOPSY Left 02/10/2015   malignant   . BREAST LUMPECTOMY Right 03/21/2015  . CHOLECYSTECTOMY  2003  . CYSTOSCOPY W/ LITHOLAPAXY / [facility]    . JOINT REPLACEMENT     bilateral knee replacement  . LEFT AND RIGHT HEART CATHETERIZATION WITH CORONARY ANGIOGRAM N/A 05/02/2012   Procedure: LEFT AND RIGHT HEART CATHETERIZATION WITH CORONARY ANGIOGRAM;  Surgeon: Burnell Blanks, MD;  Location[facility] CATH LAB;  Service: Cardiovascular;  Laterality: N/A;  . RADIOACTIVE SEED GUIDED PARTIAL MASTECTOMY WITH AXILLARY SENTINEL LYMPH NODE BIOPSY Right 03/21/2015   Procedure: RIGHT  PARTIAL MASTECTOMY WITH RADIOACTIVE SEED LOCALIZATION RIGHT  AXILLARY SENTINEL LYMPH NODE BIOPSY;  Surgeon: Fanny Skates, MD;  Location: [facility];  Service: General;  Laterality: Right;  . REPLACEMENT TOTAL KNEE BILATERAL  2005 &2006  . VASCULAR SURGERY Right 03/15/2013   Ultrasound guided sclerotherapy   Family History  Problem Relation Age of Onset  . Diabetes Mother   . Stroke Mother   . Heart disease Father   . Anemia Father   . Cancer Sister 17       nose cancer   . Diabetes Sister        2 sisters type 1 diabetes   . Kidney disease Sister        waiting for a kidney transplant- on dialysis  . Diabetes Brother        Type 2 diabetic   . Cancer Cousin 30       ovarian cancer   . Cancer Cousin 49       ovarian  cancer   . Diabetes Son        Type 2   . Hypertension Son    Social History   Socioeconomic History  . Marital status: Widowed    Spouse name: [name]  . Number of children: 3  . Years of education: 89  . Highest education level: Associate degree: occupational, Hotel manager, or vocational program  Occupational History  . Occupation: retired-CNA, Microbiologist: RETIRED  . Occupation: Psychologist, sport and exercise  Social Needs  . Financial resource strain: Not hard at all  . Food insecurity:    Worry: Never true    Inability: Never true  . Transportation needs:    Medical: No    Non-medical: No  Tobacco Use  . Smoking status: Never Smoker  . Smokeless tobacco: Never Used  Substance and Sexual Activity  . Alcohol use: No    Alcohol/week: 0.0 standard drinks  . Drug use: No  . Sexual activity: Not Currently    Birth control/protection: Post-menopausal  Lifestyle  . Physical activity:    Days per week: 0 days    Minutes per session: 0 min  . Stress: Not at all  Relationships  . Social connections:    Talks on phone: More than three times a week    Gets together: Three times a week    Attends religious service: More than 4 times per year    Active member of club or organization: Yes    Attends meetings of clubs or organizations: More than 4 times per year    Relationship status: Widowed  Other Topics Concern  . Not on file  Social History Narrative   Emergency Contact: son, Ronie Fleeger [phone]   Patient lives is single story home with daughter, [name] and grandddaughter, [name]. Ramp to front door, 3 steps in back. Home has smoke detectors, no throw rugs. No grab bars in bathroom. No pets.   Diet: Pt has a varied diet.  Reports eating 2 meals a day.    Exercise: walks daily 15-20 mins; not as much due to possible irritable bowel; does exercise at church once weekly. Serves on [employer] at [employer] and as a Chief Executive Officer at [employer].   Seatbelts: Pt reports  wearing seatbelt when in vehicles. Does not drive.   Hobbies: word searches, church, time with family and friends, cooking, walking when she can; going out shopping.   Drinks occasional soda; drinks a lot of water and some kool-aid; does drink tea      *Update as of 04/19/2019*   Patient has not able to walk around  her neighborhood, or down her street, due to [place]. Patient is eager to get back to this once she feels "safe" to leave her house.     Outpatient Encounter Medications as of 04/19/2019  Medication Sig  . acetaminophen (TYLENOL 8 HOUR ARTHRITIS PAIN) 650 MG CR tablet Take 650 mg by mouth every 8 (eight) hours as needed for pain.  Marland Kitchen allopurinol (ZYLOPRIM) 100 MG tablet TAKE 1 TABLET(100 MG) BY MOUTH DAILY  . aspirin 81 MG tablet Take 81 mg by mouth daily.  Marland Kitchen atorvastatin (LIPITOR) 40 MG tablet TAKE 1 TABLET BY MOUTH ONCE DAILY  . benazepril (LOTENSIN) 20 MG tablet TAKE 1 TABLET BY MOUTH ONCE DAILY  . colchicine 0.6 MG tablet TAKE 1 TABLET BY MOUTH ONCE DAILY  . dorzolamide-timolol (COSOPT) 22.3-6.8 MG/ML ophthalmic solution Place 1 drop into both eyes 2 (two) times daily.  Marland Kitchen exemestane (AROMASIN) 25 MG tablet TAKE 1 TABLET BY MOUTH ONCE DAILY AFTER BREAKFAST  . fluticasone (FLOVENT HFA) 110 MCG/ACT inhaler INHALE 2 PUFFS BY MOUTH TWICE A DAY  . furosemide (LASIX) 20 MG tablet Take 1 tablet (20 mg total) by mouth every other day.  . gabapentin (NEURONTIN) 100 MG capsule TAKE 3 CAPSULES(300 MG) BY MOUTH THREE TIMES DAILY  . JARDIANCE 10 MG TABS tablet TAKE 1 TABLET BY MOUTH DAILY  . latanoprost (XALATAN) 0.005 % ophthalmic solution Place 1 drop into both eyes 4 (four) times daily.   . metFORMIN (GLUCOPHAGE XR) 500 MG 24 hr tablet Take 1 tablet (500 mg total) by mouth 2 (two) times daily.  . metoprolol succinate (TOPROL-XL) 25 MG 24 hr tablet Take 25 mg by mouth daily.   Marland Kitchen spironolactone (ALDACTONE) 25 MG tablet Take 1 tablet (25 mg total) by mouth daily.  . traMADol (ULTRAM) 50 MG  tablet Take 1 tablet (50 mg total) by mouth every 6 (six) hours as needed.   No facility-administered encounter medications on file as of 04/19/2019.     Activities of Daily Living In your present state of health, do you have any difficulty performing the following activities: 04/19/2019 01/30/2019  Hearing? N N  Vision? N N  Comment - -  Difficulty concentrating or making decisions? Y N  Comment Pt stated "getting slow on remembering things"  -  Walking or climbing stairs? Y Y  Comment uses a walker or cane  knee pain and back pain  Dressing or bathing? N N  Doing errands, shopping? N Y  Conservation officer, nature and eating ? N N  Using the Toilet? N N  In the past six months, have you accidently leaked urine? N N  Do you have problems with loss of bowel control? N N  Managing your Medications? N N  Managing your Finances? N N  Housekeeping or managing your Housekeeping? N Y  Some recent data might be hidden    Patient Care Team: Lind Covert, MD as PCP - General (Family Medicine) Gevena Cotton, MD (Ophthalmology) Rana Snare, MD (Urology) Dr. Edwin Cap ([place]) Fanny Skates, MD as Consulting Physician (General Surgery) Holley Bouche, NP (Inactive) as Nurse Practitioner (Nurse Practitioner) Truitt Merle, MD as Consulting Physician (Hematology) Gardiner Barefoot, DPM (Podiatry) [place], Eppie Gibson, RN as [place] [facility] Chambliss, Jeb Levering, MD (Family Medicine)    Assessment:   This is a routine wellness examination for Erleen.  Exercise Activities and Dietary recommendations Current Exercise Habits: Home exercise routine, Type of exercise: stretching, Time (Minutes): 30, Frequency (Times/Week): 7, Intensity:  Mild, Exercise limited by: Other - see comments(Patient has problems with arthritis in knees )  Goals    . Blood Pressure < 150/90    . HEMOGLOBIN A1C < 7.0 (pt-stated)     Patient stated she would like to see her A1C less  than 7. Pt stated she is eager to start walking around her street again. Pt reported she was walking "a lot" before COVID and hopes to get back to walking once social distancing is over.          Fall Risk Fall Risk  04/19/2019 04/06/2019 02/07/2019 01/30/2019 11/14/2018  Falls in the past year? 0 0 0 0 0  Number falls in past yr: 0 - - - -  Injury with Fall? - - - - -  Risk Factor Category  - - - - -  Risk for fall due to : Impaired balance/gait - - Impaired balance/gait;Impaired mobility -  Risk for fall due to: Comment - - - - -  Follow up (No Data) - - - -  Comment PT has completed PT today for balance  - - - -   Is the patient's home free of loose throw rugs in walkways, pet beds, electrical cords, etc?   yes      Grab bars in the bathroom? no      Handrails on the stairs?   yes      Adequate lighting?   yes  Depression Screen PHQ 2/9 Scores 04/19/2019 04/06/2019 02/07/2019 01/30/2019  PHQ - 2 Score 1 0 0 0  PHQ- 9 Score - - - -     Cognitive Function MMSE - Mini Mental State Exam 08/29/2018 05/21/2015 01/18/2014 04/26/2013 04/03/2012  Orientation to time 5 5 5 5 5   Orientation to Place 5 5 5 5 5   Registration 3 3 3 3 3   Attention/ Calculation 5 5 5 5 5   Recall 3 3 3 1 3   Language- name 2 objects 2 2 2 2 2   Language- repeat 1 1 1 1 1   Language- follow 3 step command 3 3 3 3 3   Language- read & follow direction 1 1 1 1 1   Write a sentence 1 1 1 1 1   Copy design 1 1 1 1 1   Total score 30 30 30 28 30      6CIT Screen 04/19/2019 04/19/2019 04/19/2019 08/29/2018  What Year? 0 points 0 points 0 points 0 points  What month? 0 points - 0 points 0 points  What time? 0 points 0 points 0 points 0 points  Count back from 20 0 points 0 points 0 points 0 points  Months in reverse 0 points - 0 points 0 points  Repeat phrase 0 points 0 points 0 points 0 points  Total Score 0 - 0 0    Immunization History  Administered Date(s) Administered  . Influenza Split 08/17/2011, 08/23/2012  . Influenza  Whole 09/12/2009, 12/14/2010  . Influenza,inj,Quad PF,6+ Mos 08/27/2013, 09/02/2014, 08/13/2015, 09/08/2016, 10/05/2017, 08/29/2018  . Pneumococcal Conjugate-13 05/21/2015  . Pneumococcal Polysaccharide-23 04/30/2012, 10/05/2017  . Td 07/14/2009  . Tdap 09/23/2018  . Zoster 06/02/2012     Screening Tests Health Maintenance  Topic Date Due  . INFLUENZA VACCINE  06/30/2019  . OPHTHALMOLOGY EXAM  08/30/2019  . HEMOGLOBIN A1C  10/07/2019  . FOOT EXAM  03/29/2020  . MAMMOGRAM  02/05/2021  . COLONOSCOPY  10/03/2021  . TETANUS/TDAP  09/23/2028  . DEXA SCAN  Completed  . Hepatitis C Screening  Completed  . PNA vac Low Risk Adult  Completed  . DTaP/Tdap/Td  Discontinued    Cancer Screenings: Lung: Low Dose CT Chest recommended if Age 60-80 years, 30 pack-year currently smoking OR have quit w/in 15years. Patient does not qualify. Breast:  Up to date on Mammogram? Yes   Up to date of Bone Density/Dexa? Yes Colorectal: Up To Date  Additional Screenings: Hepatitis C Screening: Completed    Plan:  Continue your in home exercise plan of stretching and walking around your house.  AVS will be mailed to the patient.   I have personally reviewed and noted the following in the patient's chart:    . Medical and social history . Use of alcohol, tobacco or illicit drugs  . Current medications and supplements . Functional ability and status . Nutritional status . Physical activity . Advanced directives . List of other physicians . Hospitalizations, surgeries, and ER visits in previous 12 months . Vitals . Screenings to include cognitive, depression, and falls . Referrals and appointments  In addition, I have reviewed and discussed with patient certain preventive protocols, quality metrics, and best practice recommendations. A written personalized care plan for preventive services as well as general preventive health recommendations were provided to patient.    This visit was  conducted virtually in the setting of the [place] pandemic.    Dorna Bloom, [place] [address] [place]  04/19/2019     Reviewed and discussed with Ms Morey Hummingbird

## 2019-04-20 ENCOUNTER — Other Ambulatory Visit: Payer: Self-pay | Admitting: Family Medicine

## 2019-04-20 DIAGNOSIS — E1169 Type 2 diabetes mellitus with other specified complication: Secondary | ICD-10-CM

## 2019-04-26 ENCOUNTER — Ambulatory Visit: Payer: Medicare Other | Admitting: Physical Therapy

## 2019-04-27 NOTE — Addendum Note (Signed)
Addended by: Dorna Bloom on: 04/27/2019 04:55 PM   Modules accepted: Level of Service

## 2019-05-02 ENCOUNTER — Ambulatory Visit: Payer: Self-pay | Admitting: *Deleted

## 2019-05-06 ENCOUNTER — Other Ambulatory Visit: Payer: Self-pay | Admitting: Family Medicine

## 2019-05-25 ENCOUNTER — Telehealth: Payer: Self-pay | Admitting: Hematology

## 2019-05-25 NOTE — Telephone Encounter (Signed)
YF PAL moved 7/6 lab/fu to 7/9. Confirmed with patient.

## 2019-05-31 NOTE — Progress Notes (Signed)
[place]   [phone]) [phone] Fax:([phone]) [phone]   Clinic Follow up Note   Patient Care Team: Lind Covert, MD as PCP - General (Family Medicine) Gevena Cotton, MD (Ophthalmology) Rana Snare, MD (Urology) Dr. Edwin Cap (Dental General Practice) Fanny Skates, MD as Consulting Physician (General Surgery) Holley Bouche, NP (Inactive) as Nurse Practitioner (Nurse Practitioner) Truitt Merle, MD as Consulting Physician (Hematology) Gardiner Barefoot, DPM (Podiatry) Pleasant, Eppie Gibson, RN as [place] [facility] Chambliss, Jeb Levering, MD (Family Medicine)  Date of Service:  06/07/2019  CHIEF COMPLAINT: F/u of right breast cancer  SUMMARY OF ONCOLOGIC HISTORY: Oncology History Overview Note    Breast cancer of lower-outer quadrant of right female breast   Staging form: Breast, AJCC 7th Edition     Clinical stage from 02/19/2015: Stage IA (T1b, N0, M0) - Unsigned       Staging comments: Staged at breast conference on 3.23.16      Pathologic stage from 03/24/2015: Stage IA (T1c, N0, cM0) - Signed by Enid Cutter, MD on 03/31/2015       Staging comments: Staged on final lumpectomy specimen by Dr. Donato Heinz.    Breast cancer of lower-outer quadrant of right female breast ([place])  01/23/2015 Mammogram   Possible mass in right breast warrants further evaluation.  No findings in the left breast suspicious for malignancy   02/03/2015 Breast US   Right breast: 5 x 5 x 5 mm irregular hypoechoic mass with internal color vascularity at 6 o'clock position, 4 cm from the nipple   02/10/2015 Initial Biopsy   Right needle core bx LOQ: Invasive ductal carcinoma, grade 1, ER+ (100%), PR+ (60%), HER2/neu negative (ratio 1.13), Ki67 3%; DCIS.     02/12/2015 Breast MRI   Biopsy-proven malignancy in the central right breast at posterior depth. No MR findings to suggest multicentric or contralateral malignancy.   02/12/2015 Clinical Stage   Stage IA: T1b N0   03/21/2015 Definitive Surgery   Right breast lumpectomy / SLNB Dalbert Batman): Invasive ductal carcinoma, grade 1, 1.1 cm, ER+ (100%), PR+ (73%), HER-2 negative (ratio 1.3), Ki67 6%, negative margins / no lymphovascular invasion; DCIS.  One lymph node negative for tumor (0/1).     03/21/2015 Oncotype testing   RS 7, low risk. (ROR 5% for 10-year recurrence with Tamoxifen alone).    03/21/2015 Pathologic Stage   Stage IA: pT1c pN0   05/07/2015 - 06/23/2015 Radiation Therapy   Adjuvant RT completed Pablo Ledger): Right breast 45 Gy over 25 fractions.  Right breast boost 16 Gy over 8 fractions. Total dose: 60 Gy.   06/02/2015 -  Anti-estrogen oral therapy   Aromasin 25 mg once daily Burr Medico).  Planned duration of therapy: 5 years.   08/15/2015 Survivorship   Survivorship visit completed and copy of survivorship care plan provided to patient.   01/31/2018 Mammogram   IMPRESSION: No mammographic evidence for malignancy.      CURRENT THERAPY:  Aromasin 62m daily, started on 06/02/2015  INTERVAL HISTORY:  AATOYA ANDREWis here for a follow up right breast cancer. She was last seen by me 9 months ago. She presents to the clinic alone. She notes she is doing well. She notes in the last 6 months no new changed except mild night sweats that can effects her sleep at times. She notes her prior joint pain in her hands and feet has mildly worsened. She notes after seeing her PCP, her BG is up but not significantly. Her last A1c  was 10.1 in 03/2019. She notes her back pain is still there. Her orthopedic surgeon is no longer under her insurance so she has not been seen for this in a while.   REVIEW OF SYSTEMS:   Constitutional: Denies fevers, chills or abnormal weight loss (+) night sweats (+) trouble sleeping Eyes: Denies blurriness of vision Ears, nose, mouth, throat, and face: Denies mucositis or sore throat Respiratory: Denies cough, dyspnea or wheezes Cardiovascular: Denies palpitation, chest discomfort or  lower extremity swelling Gastrointestinal:  Denies nausea, heartburn or change in bowel habits Skin: Denies abnormal skin rashes MSK: (+) joint pain in her hands and feet has mildly worsened (+) arthritis of back Lymphatics: Denies new lymphadenopathy or easy bruising Neurological:Denies numbness, tingling or new weaknesses Behavioral/Psych: Mood is stable, no new changes  All other systems were reviewed with the patient and are negative.  MEDICAL HISTORY:  Past Medical History:  Diagnosis Date  . Anemia   . Arthritis   . Asthma   . Blood transfusion without reported diagnosis    Patients believes 2015 when she overdosed on "medications"   . Breast cancer of lower-outer quadrant of right female breast ([place]) 02/13/2015  . Cataract   . CHF, acute ([place]) 05/03/2012  . Depression   . Diabetes mellitus   . Eczema   . Fatty liver    Fatty infiltration of liver noted on 03/2012 CT scan  . Fibromyalgia   . Glaucoma   . Heart disease   . History of kidney stones    w/ hx of hydronephrosis - followed by [facility]  . HIV nonspecific serology    2006: indeterminate HIV blood test, seen by ID, felt secondary to cross reacting antibodies with no further workup felt necessary at that time   . Hypertension   . Obesity   . Personal history of radiation therapy 2016   right  . Radiation 05/07/15-06/23/15   Right Breast    SURGICAL HISTORY: Past Surgical History:  Procedure Laterality Date  . BREAST BIOPSY Left 02/10/2015   malignant   . BREAST LUMPECTOMY Right 03/21/2015  . CHOLECYSTECTOMY  2003  . CYSTOSCOPY W/ LITHOLAPAXY / EHL    . JOINT REPLACEMENT     bilateral knee replacement  . LEFT AND RIGHT HEART CATHETERIZATION WITH CORONARY ANGIOGRAM N/A 05/02/2012   Procedure: LEFT AND RIGHT HEART CATHETERIZATION WITH CORONARY ANGIOGRAM;  Surgeon: Burnell Blanks, MD;  Location: [facility] C[facility] LAB;  Service: Cardiovascular;  Laterality: N/A;  . RADIOACTIVE SEED GUIDED PARTIAL MASTECTOMY  WITH AXILLARY SENTINEL LYMPH NODE BIOPSY Right 03/21/2015   Procedure: RIGHT  PARTIAL MASTECTOMY WITH RADIOACTIVE SEED LOCALIZATION RIGHT  AXILLARY SENTINEL LYMPH NODE BIOPSY;  Surgeon: Fanny Skates, MD;  Location: [facility];  Service: General;  Laterality: Right;  . REPLACEMENT TOTAL KNEE BILATERAL  2005 &2006  . VASCULAR SURGERY Right 03/15/2013   Ultrasound guided sclerotherapy    I have reviewed the social history and family history with the patient and they are unchanged from previous note.  ALLERGIES:  has No Known Allergies.  MEDICATIONS:  Current Outpatient Medications  Medication Sig Dispense Refill  . acetaminophen (TYLENOL 8 HOUR ARTHRITIS PAIN) 650 MG CR tablet Take 650 mg by mouth every 8 (eight) hours as needed for pain.    Marland Kitchen allopurinol (ZYLOPRIM) 100 MG tablet TAKE 1 TABLET(100 MG) BY MOUTH DAILY 90 tablet 1  . aspirin 81 MG tablet Take 81 mg by mouth daily.    Marland Kitchen atorvastatin (LIPITOR) 40 MG tablet  TAKE 1 TABLET BY MOUTH ONCE DAILY 90 tablet 2  . benazepril (LOTENSIN) 20 MG tablet TAKE 1 TABLET BY MOUTH ONCE DAILY 90 tablet 3  . colchicine 0.6 MG tablet TAKE 1 TABLET BY MOUTH ONCE DAILY 90 tablet 1  . dorzolamide-timolol (COSOPT) 22.3-6.8 MG/ML ophthalmic solution Place 1 drop into both eyes 2 (two) times daily.  0  . exemestane (AROMASIN) 25 MG tablet TAKE 1 TABLET BY MOUTH ONCE DAILY AFTER BREAKFAST 90 tablet 3  . fluticasone (FLOVENT HFA) 110 MCG/ACT inhaler INHALE 2 PUFFS BY MOUTH TWICE DAILY 12 g 3  . furosemide (LASIX) 20 MG tablet Take 1 tablet (20 mg total) by mouth every other day. 90 tablet 2  . gabapentin (NEURONTIN) 100 MG capsule TAKE 3 CAPSULES(300 MG) BY MOUTH THREE TIMES DAILY 270 capsule 2  . JARDIANCE 10 MG TABS tablet TAKE 1 TABLET BY MOUTH DAILY 90 tablet 0  . latanoprost (XALATAN) 0.005 % ophthalmic solution Place 1 drop into both eyes 4 (four) times daily.   0  . metFORMIN (GLUCOPHAGE XR) 500 MG 24 hr tablet Take 1 tablet (500 mg total)  by mouth 2 (two) times daily. 180 tablet 0  . metoprolol succinate (TOPROL-XL) 25 MG 24 hr tablet Take 25 mg by mouth daily.  90 tablet 3  . spironolactone (ALDACTONE) 25 MG tablet Take 1 tablet (25 mg total) by mouth daily. 90 tablet 1  . traMADol (ULTRAM) 50 MG tablet Take 1 tablet (50 mg total) by mouth every 6 (six) hours as needed. 10 tablet 0   No current facility-administered medications for this visit.     PHYSICAL EXAMINATION: ECOG PERFORMANCE STATUS: 2 - Symptomatic, <50% confined to bed  Vitals:   06/07/19 0951  BP: (!) 142/85  Pulse: 73  Resp: 18  Temp: 98.3 F (36.8 C)  SpO2: 100%   Filed Weights   06/07/19 0951  Weight: 221 lb 12.8 oz (100.6 kg)    GENERAL:alert, no distress and comfortable SKIN: skin color, texture, turgor are normal, no rashes or significant lesions EYES: normal, Conjunctiva are pink and non-injected, sclera clear  NECK: supple, thyroid normal size, non-tender, without nodularity LYMPH:  no palpable lymphadenopathy in the cervical, axillary  LUNGS: clear to auscultation and percussion with normal breathing effort HEART: regular rate & rhythm and no murmurs (+) Mild lower extremity edema ABDOMEN:abdomen soft, non-tender and normal bowel sounds Musculoskeletal:no cyanosis of digits and no clubbing  NEURO: alert & oriented x 3 with fluent speech, no focal motor/sensory deficits BREAST: S/p right lumpectomy: Surgical incision healed well with mild breast deformity from scar tissue (+) No palpable mass or adenopathy b/l  LABORATORY DATA:  I have reviewed the data as listed CBC Latest Ref Rng & Units 06/07/2019 09/23/2018 09/07/2018  WBC 4.0 - 10.5 K/uL 9.9 8.4 9.3  Hemoglobin 12.0 - 15.0 g/dL 11.9(L) 10.8(L) 11.1(L)  Hematocrit 36.0 - 46.0 % 38.7 37.4 36.4  Platelets 150 - 400 K/uL 150 192 173     CMP Latest Ref Rng & Units 06/07/2019 04/06/2019 11/24/2018  Glucose 70 - 99 mg/dL 207(H) 279(H) 334(H)  BUN 8 - 23 mg/dL 37(H) 32(H) 40(H)   Creatinine 0.44 - 1.00 mg/dL 1.47(H) 1.29(H) 1.60(H)  Sodium 135 - 145 mmol/L 138 140 135  Potassium 3.5 - 5.1 mmol/L 4.2 4.4 4.6  Chloride 98 - 111 mmol/L 107 101 98  CO2 22 - 32 mmol/L 17(L) 21 20  Calcium 8.9 - 10.3 mg/dL 9.2 9.6 9.9  Total Protein 6.5 -  8.1 g/dL 7.8 - -  Total Bilirubin 0.3 - 1.2 mg/dL 0.4 - -  Alkaline Phos 38 - 126 U/L 164(H) - -  AST 15 - 41 U/L 20 - -  ALT 0 - 44 U/L 17 - -      RADIOGRAPHIC STUDIES: I have personally reviewed the radiological images as listed and agreed with the findings in the report. No results found.   ASSESSMENT & PLAN:  [name] is a 68 y.o. female with   1. Breast cancer of lower-outer quadrant of right breast, invasive ductal carcinoma, pT1c N0 M0, stage IA, ER positive PR positive HER-2 negative, Ki 67 6%, and DCIS -She was diagnosed in 01/2015. She is s/p right breast lumpectomy with SLNB and adjuvant Radiation.  -Her Oncotype showed low risk, no need adjuvant chemotherapy -She started antiestrogen therapy with Exemestane in 05/2015. She is tolerating well so far, we'll continue, plan for total 5 years until 05/2020.  -Since her last visit, she has experienced more night sweats, mildly worsened joint pain and worsening DM. I discussed her Exemestane can lead to this. I encouraged her to increase her activity level with walking and to watch her diet. Will monitor this closet with PCP.  -From a breast cancer standpoint she is doing well. Labs reviewed, CBC and CMP Cr 1.47 and BG 207. Physical exam and 01/2019 Mammogram unremarkable. There is no clinical concerns of recurrence. -Continue breast cancer surveillance. Next mammogram in 01/2020.  -Continue Exemestane  -f/u in 6 months  2. Bone health -I have discussed the side effect of osteopenia and osteoporosis from Aromasin -Her bone density scan in 08/2015 was normal. DEXA from 01/31/18 normal with T Score of -0.9 -I encouraged her to continue to take Vitamin D or multivitamin   3. Hypertension, diabetes, arthritis, CHF -She will continue follow-up with her primary care physician -She is bradycardiac, I advised her to see her Cardiologist to adjust her medications. She denies feeling dizzy.  -She has stable mild back pain from arthritis. She does not currently have a orthopedic surgeon to follow up.  -Her last A1c increased to 10.1 in 03/2019. I strongly encouraged her to better control this with better diet and more exercise/walking.  -BP at 142/85 today (06/07/19)  4. CKD stage III  -Her GFR is in 40s, stable overall, continue monitoring -previously advised to avoid nephrotoxin  -She follows up with nephrology, kidney function overall stable.   5. Unstable gait  -she has had this issue for several years, previous was seen by neurologist Dr. Rexene Alberts  -Previous brain MRI was negative -Follow-up with primary care physician, she has completed PT.  -Her balance has improved. I encouraged her to continue PT exercises.    6. Anemia -She has mild chronic normocytic anemia, Probably secondary to her CKD -Her prior study in 2014 showed elevated ferritin, normal iron -Overall stable, hemoglobin at 11.9 today (06/07/19), we'll continue monitoring.   7. Bradycardia -She was found to be bradycardia on the vital sign, she was asymmetric. -Prior EKG during her visit, which showed sinus rhythm 86, with frequent premature ventricular complexes. No significant ST change on the EKG. -No bradycardia on exam today   Plan -Continue Exemestane  -Lab and f/u in 6 months  -Mammogram in 01/2020   No problem-specific Assessment & Plan notes found for this encounter.   No orders of the defined types were placed in this encounter.  All questions were answered. The patient knows to call the clinic with any  problems, questions or concerns. No barriers to learning was detected. I spent 20 minutes counseling the patient face to face. The total time spent in the appointment was 25  minutes and more than 50% was on counseling and review of test results     Truitt Merle, MD 06/07/2019   I, Joslyn Devon, am acting as scribe for Truitt Merle, MD.   I have reviewed the above documentation for accuracy and completeness, and I agree with the above.

## 2019-06-04 ENCOUNTER — Ambulatory Visit: Payer: Medicaid Other | Admitting: Hematology

## 2019-06-04 ENCOUNTER — Other Ambulatory Visit: Payer: Medicaid Other

## 2019-06-07 ENCOUNTER — Other Ambulatory Visit: Payer: Self-pay

## 2019-06-07 ENCOUNTER — Encounter: Payer: Self-pay | Admitting: Hematology

## 2019-06-07 ENCOUNTER — Inpatient Hospital Stay: Payer: Medicare Other | Attending: Hematology

## 2019-06-07 ENCOUNTER — Inpatient Hospital Stay (HOSPITAL_BASED_OUTPATIENT_CLINIC_OR_DEPARTMENT_OTHER): Payer: Medicare Other | Admitting: Hematology

## 2019-06-07 ENCOUNTER — Telehealth: Payer: Self-pay | Admitting: Hematology

## 2019-06-07 VITALS — BP 142/85 | HR 73 | Temp 98.3°F | Resp 18 | Ht 62.0 in | Wt 221.8 lb

## 2019-06-07 DIAGNOSIS — D649 Anemia, unspecified: Secondary | ICD-10-CM

## 2019-06-07 DIAGNOSIS — R001 Bradycardia, unspecified: Secondary | ICD-10-CM

## 2019-06-07 DIAGNOSIS — I5042 Chronic combined systolic (congestive) and diastolic (congestive) heart failure: Secondary | ICD-10-CM | POA: Diagnosis not present

## 2019-06-07 DIAGNOSIS — E1122 Type 2 diabetes mellitus with diabetic chronic kidney disease: Secondary | ICD-10-CM | POA: Diagnosis not present

## 2019-06-07 DIAGNOSIS — N183 Chronic kidney disease, stage 3 (moderate): Secondary | ICD-10-CM

## 2019-06-07 DIAGNOSIS — Z170 Estrogen receptor positive status [ER+]: Secondary | ICD-10-CM | POA: Diagnosis not present

## 2019-06-07 DIAGNOSIS — I10 Essential (primary) hypertension: Secondary | ICD-10-CM | POA: Diagnosis not present

## 2019-06-07 DIAGNOSIS — C50511 Malignant neoplasm of lower-outer quadrant of right female breast: Secondary | ICD-10-CM

## 2019-06-07 DIAGNOSIS — I509 Heart failure, unspecified: Secondary | ICD-10-CM | POA: Diagnosis not present

## 2019-06-07 DIAGNOSIS — E669 Obesity, unspecified: Secondary | ICD-10-CM | POA: Diagnosis not present

## 2019-06-07 DIAGNOSIS — I130 Hypertensive heart and chronic kidney disease with heart failure and stage 1 through stage 4 chronic kidney disease, or unspecified chronic kidney disease: Secondary | ICD-10-CM | POA: Diagnosis not present

## 2019-06-07 LAB — CBC WITH DIFFERENTIAL/PLATELET
Abs Immature Granulocytes: 0.14 10*3/uL — ABNORMAL HIGH (ref 0.00–0.07)
Basophils Absolute: 0.1 10*3/uL (ref 0.0–0.1)
Basophils Relative: 1 %
Eosinophils Absolute: 0.4 10*3/uL (ref 0.0–0.5)
Eosinophils Relative: 4 %
HCT: 38.7 % (ref 36.0–46.0)
Hemoglobin: 11.9 g/dL — ABNORMAL LOW (ref 12.0–15.0)
Immature Granulocytes: 1 %
Lymphocytes Relative: 32 %
Lymphs Abs: 3.2 10*3/uL (ref 0.7–4.0)
MCH: 29.9 pg (ref 26.0–34.0)
MCHC: 30.7 g/dL (ref 30.0–36.0)
MCV: 97.2 fL (ref 80.0–100.0)
Monocytes Absolute: 0.7 10*3/uL (ref 0.1–1.0)
Monocytes Relative: 7 %
Neutro Abs: 5.4 10*3/uL (ref 1.7–7.7)
Neutrophils Relative %: 55 %
Platelets: 150 10*3/uL (ref 150–400)
RBC: 3.98 MIL/uL (ref 3.87–5.11)
RDW: 13.2 % (ref 11.5–15.5)
WBC: 9.9 10*3/uL (ref 4.0–10.5)
nRBC: 0 % (ref 0.0–0.2)

## 2019-06-07 LAB — CMP (CANCER CENTER ONLY)
ALT: 17 U/L (ref 0–44)
AST: 20 U/L (ref 15–41)
Albumin: 4 g/dL (ref 3.5–5.0)
Alkaline Phosphatase: 164 U/L — ABNORMAL HIGH (ref 38–126)
Anion gap: 14 (ref 5–15)
BUN: 37 mg/dL — ABNORMAL HIGH (ref 8–23)
CO2: 17 mmol/L — ABNORMAL LOW (ref 22–32)
Calcium: 9.2 mg/dL (ref 8.9–10.3)
Chloride: 107 mmol/L (ref 98–111)
Creatinine: 1.47 mg/dL — ABNORMAL HIGH (ref 0.44–1.00)
GFR, Est AFR Am: 41 mL/min — ABNORMAL LOW (ref >60)
GFR, Estimated: 36 mL/min — ABNORMAL LOW (ref >60)
Glucose, Bld: 207 mg/dL — ABNORMAL HIGH (ref 70–99)
Potassium: 4.2 mmol/L (ref 3.5–5.1)
Sodium: 138 mmol/L (ref 135–145)
Total Bilirubin: 0.4 mg/dL (ref 0.3–1.2)
Total Protein: 7.8 g/dL (ref 6.5–8.1)

## 2019-06-07 NOTE — Telephone Encounter (Signed)
Gave avs and calendar ° °

## 2019-06-19 ENCOUNTER — Other Ambulatory Visit: Payer: Self-pay

## 2019-06-19 DIAGNOSIS — M21969 Unspecified acquired deformity of unspecified lower leg: Secondary | ICD-10-CM

## 2019-06-19 DIAGNOSIS — E785 Hyperlipidemia, unspecified: Secondary | ICD-10-CM

## 2019-06-19 DIAGNOSIS — E1169 Type 2 diabetes mellitus with other specified complication: Secondary | ICD-10-CM

## 2019-06-19 MED ORDER — ALLOPURINOL 100 MG PO TABS: ORAL_TABLET | ORAL | 1 refills | Status: DC

## 2019-06-19 MED ORDER — JARDIANCE 10 MG PO TABS: 10.0000 mg | ORAL_TABLET | Freq: Every day | ORAL | 0 refills | Status: DC

## 2019-06-19 MED ORDER — ATORVASTATIN CALCIUM 40 MG PO TABS: 40.0000 mg | ORAL_TABLET | Freq: Every day | ORAL | 2 refills | Status: DC

## 2019-06-19 MED ORDER — COLCHICINE 0.6 MG PO TABS: 0.6000 mg | ORAL_TABLET | Freq: Every day | ORAL | 0 refills | Status: DC

## 2019-06-19 MED ORDER — METFORMIN HCL ER 500 MG PO TB24: 500.0000 mg | ORAL_TABLET | Freq: Two times a day (BID) | ORAL | 0 refills | Status: DC

## 2019-06-19 MED ORDER — SPIRONOLACTONE 25 MG PO TABS: 25.0000 mg | ORAL_TABLET | Freq: Every day | ORAL | 1 refills | Status: DC

## 2019-06-19 MED ORDER — FUROSEMIDE 20 MG PO TABS: 20.0000 mg | ORAL_TABLET | ORAL | 2 refills | Status: DC

## 2019-06-19 MED ORDER — FLOVENT HFA 110 MCG/ACT IN AERO: INHALATION_SPRAY | RESPIRATORY_TRACT | 3 refills | Status: DC

## 2019-06-20 ENCOUNTER — Other Ambulatory Visit: Payer: Self-pay | Admitting: Hematology

## 2019-06-20 DIAGNOSIS — Z170 Estrogen receptor positive status [ER+]: Secondary | ICD-10-CM

## 2019-06-20 DIAGNOSIS — C50511 Malignant neoplasm of lower-outer quadrant of right female breast: Secondary | ICD-10-CM

## 2019-06-20 MED ORDER — EXEMESTANE 25 MG PO TABS: 25.0000 mg | ORAL_TABLET | Freq: Every day | ORAL | 3 refills | Status: DC

## 2019-06-21 ENCOUNTER — Other Ambulatory Visit: Payer: Self-pay | Admitting: *Deleted

## 2019-06-21 ENCOUNTER — Other Ambulatory Visit: Payer: Self-pay

## 2019-06-21 MED ORDER — ALLOPURINOL 100 MG PO TABS: ORAL_TABLET | ORAL | 0 refills | Status: DC

## 2019-06-21 NOTE — Patient Outreach (Signed)
[facility]) Care Management  04/03/3975   [name] 7/34/1939 [id number]  RN Health Coach received return telephone call from patient.  [place] compliance verified. Patient A1C is 10.1 Per patient she does not check her blood sugar because she is on metformin. RN asked patient does she ever have the symptoms of high or low blood sugars. Per patient she has felt weak a couple of times and drank some water and sat down. Patient does not know if she was hypo or hyperglycemic. Per patient she knows she had gotten off her diet and started  eating more snacks and food she should not have. Per patient her physical therapy stopped in June. She is still doing some of the exercises as much as she can and stops when her knees start hurting. Patient stated her blood pressure is doing good and she checks it and her weight every day. Patient has agreed to follow up outreach calls.   Current Medications:  Current Outpatient Medications  Medication Sig Dispense Refill  . acetaminophen (TYLENOL 8 HOUR ARTHRITIS PAIN) 650 MG CR tablet Take 650 mg by mouth every 8 (eight) hours as needed for pain.    Marland Kitchen allopurinol (ZYLOPRIM) 100 MG tablet TAKE 1 TABLET(100 MG) BY MOUTH DAILY 90 tablet 0  . aspirin 81 MG tablet Take 81 mg by mouth daily.    Marland Kitchen atorvastatin (LIPITOR) 40 MG tablet Take 1 tablet (40 mg total) by mouth daily. 90 tablet 2  . benazepril (LOTENSIN) 20 MG tablet TAKE 1 TABLET BY MOUTH ONCE DAILY 90 tablet 3  . colchicine 0.6 MG tablet Take 1 tablet (0.6 mg total) by mouth daily. 90 tablet 0  . dorzolamide-timolol (COSOPT) 22.3-6.8 MG/ML ophthalmic solution Place 1 drop into both eyes 2 (two) times daily.  0  . empagliflozin (JARDIANCE) 10 MG TABS tablet Take 10 mg by mouth daily. 90 tablet 0  . exemestane (AROMASIN) 25 MG tablet Take 1 tablet (25 mg total) by mouth daily after breakfast. Take 1 tab daily 90 tablet 3  . fluticasone (FLOVENT HFA) 110 MCG/ACT inhaler INHALE 2 PUFFS BY  MOUTH TWICE DAILY 12 g 3  . furosemide (LASIX) 20 MG tablet Take 1 tablet (20 mg total) by mouth every other day. 90 tablet 2  . gabapentin (NEURONTIN) 100 MG capsule TAKE 3 CAPSULES(300 MG) BY MOUTH THREE TIMES DAILY 270 capsule 2  . latanoprost (XALATAN) 0.005 % ophthalmic solution Place 1 drop into both eyes 4 (four) times daily.   0  . metFORMIN (GLUCOPHAGE XR) 500 MG 24 hr tablet Take 1 tablet (500 mg total) by mouth 2 (two) times daily. 180 tablet 0  . metoprolol succinate (TOPROL-XL) 25 MG 24 hr tablet Take 25 mg by mouth daily.  90 tablet 3  . spironolactone (ALDACTONE) 25 MG tablet Take 1 tablet (25 mg total) by mouth daily. 90 tablet 1  . traMADol (ULTRAM) 50 MG tablet Take 1 tablet (50 mg total) by mouth every 6 (six) hours as needed. 10 tablet 0   No current facility-administered medications for this visit.     Functional Status:  In your present state of health, do you have any difficulty performing the following activities: 06/21/2019 04/19/2019  Hearing? N N  Vision? N N  Comment - -  Difficulty concentrating or making decisions? N Y  Comment - Pt stated "getting slow on remembering things"   Walking or climbing stairs? Y Y  Comment Per patient due to the knee pain uses  a walker or cane   Dressing or bathing? N N  Doing errands, shopping? Y N  Comment Her daughter does the errands -  Conservation officer, nature and eating ? N N  Using the Toilet? N N  In the past six months, have you accidently leaked urine? N N  Do you have problems with loss of bowel control? - N  Managing your Medications? N N  Managing your Finances? N N  Housekeeping or managing your Housekeeping? Y N  Some recent data might be hidden    Fall/Depression Screening: Fall Risk  06/21/2019 04/19/2019 04/06/2019  Falls in the past year? 0 0 0  Number falls in past yr: - 0 -  Injury with Fall? - - -  Risk Factor Category  - - -  Risk for fall due to : Impaired balance/gait;Impaired mobility Impaired balance/gait -   Risk for fall due to: Comment - - -  Follow up - (No Data) -  Comment - PT has completed PT today for balance  -   PHQ 2/9 Scores 06/21/2019 04/19/2019 04/06/2019 02/07/2019 01/30/2019 11/14/2018 08/29/2018  PHQ - 2 Score 1 1 0 0 0 0 0  PHQ- 9 Score - - - - - - -   THN CM Care Plan Problem One     Most Recent Value  Care Plan Problem One  Knowledge Deficit in self management of Diabetes  Role Documenting the Problem One  Health Coach  [place] Term Goal   Patient will see a decrease in A1C from 10.1 with next blood draw  [place] Term Goal Start Date  06/21/19  Interventions for Problem One Long Term Goal  RN discussed patient eaating habits. Patient does not check her blood sugars. RN requested a meter and supplies from physician for patient to start checking blood sugars. RN discussed patient A1C and fasting blood sugar parameters. RN will follow up with further discussion  THN CM Short Term Goal #1   Patient will be able to make healthier choices with snacks within the next 30 days  THN CM Short Term Goal #1 Start Date  06/21/19  Interventions for Short Term Goal #1  RN resent the list of healthy snacks for patient to choos from. RN discussed the patient eating habits and the changes that she can make. RN will follow up with further discussion  THN CM Short Term Goal #2   Patient will be able to verbalize symptoms of high and low blood sugars and the action plan within the next 30 days  THN CM Short Term Goal #2 Start Date  06/21/19  Interventions for Short Term Goal #2  Patient is not sure what symptoms are for high and low blood sugar. RN sent a picture sheet for hypo and hyperglycemia with action plan. RN will follow up with further discussion  THN CM Short Term Goal #3  Patient will be able to verbalize making health maintenance appointments. RN sent educational material on Diabetes taking care of yourself throughout the year. RN sent Diabetes taking care of yourself day to day. RN will follow  up with further outreach for compliance.  THN CM Short Term Goal #4  Patient will verbalize understanding healthy food choices from dineing out within the next 30 days  THN CM Short Term Goal #4 Start Date  06/21/19  Interventions for Short Term Goal #4  RN discussed with patient out dining out from fast food places. RN discussed sharing the list with daughter that  sometimes brings fast food home to help make the right choices for her. RN sent a picture sheet of dining out fast foods to pick from.RN will follow up with further discussion.      Assessment:  A1C 10.1 Patient does not check blood sugar Patient does not know the symptoms of high and low blood sugar Patient has not been adhering to her diet Patient will continue to benefit from [place] telephonic outreach for education and support for diabetes self management.  Plan:  RN disussed hypo and hyperglycemia RN sent a picture sheet of hypo and hypoglycemia symptons and action plan RN discussed healthy foods dining out RN sent a picture sheet of diabetic foods to choose off fast food menu RN discussed checking blood sugars RN sent PCP a message to inquire about ordering glucose meter and supplies  RN discussed healthy snacks RN resent the list of healthy snack choices RN will follow up within then month of September  RN sent assessment update to PCP  [place] Management [phone]

## 2019-06-21 NOTE — Patient Outreach (Signed)
[facility]) Care Management  2/94/7654  [name] [birth year] [id number]   RN Health Coach attempted follow up outreach call to patient.  Patient was unavailable. HIPPA compliance voicemail message left with return callback number.  Plan: RN will call patient again within 30 days.  [place] Care Management [phone]

## 2019-06-25 ENCOUNTER — Other Ambulatory Visit: Payer: Self-pay | Admitting: *Deleted

## 2019-06-25 MED ORDER — GABAPENTIN 100 MG PO CAPS: ORAL_CAPSULE | ORAL | 1 refills | Status: DC

## 2019-06-26 ENCOUNTER — Telehealth: Payer: Self-pay | Admitting: *Deleted

## 2019-06-26 ENCOUNTER — Other Ambulatory Visit: Payer: Self-pay | Admitting: *Deleted

## 2019-06-26 NOTE — Patient Outreach (Signed)
[facility]) Care Management  5/53/7482  LORRENA GORANSON [birth year] [id number]  [place] followed up  with patient  On glucometer and supplies.  regarding a meter and supplies. Per patient she had not heard from Dr office.  RN telephone call to Dr office left message on office voicemail to make aware of patient need for glucometer, strips and supplies. Patient A1C 10.0 and does not check blood sugars. RN had sent a message to PCP on 06/21/2019 with no response.  [place] Care Management [phone]

## 2019-06-26 NOTE — Telephone Encounter (Signed)
Zettie Pho on nurse line requesting a glucometer for patient.  Patient states that she does not check sugars because she does not have a meter.   Will forward to MD to review and send. Christen Bame, CMA

## 2019-07-04 ENCOUNTER — Other Ambulatory Visit: Payer: Self-pay

## 2019-07-04 ENCOUNTER — Ambulatory Visit (INDEPENDENT_AMBULATORY_CARE_PROVIDER_SITE_OTHER): Payer: Medicare Other | Admitting: Podiatry

## 2019-07-04 ENCOUNTER — Encounter: Payer: Self-pay | Admitting: Podiatry

## 2019-07-04 ENCOUNTER — Other Ambulatory Visit: Payer: Self-pay | Admitting: Family Medicine

## 2019-07-04 VITALS — Temp 97.3°F

## 2019-07-04 DIAGNOSIS — B351 Tinea unguium: Secondary | ICD-10-CM | POA: Diagnosis not present

## 2019-07-04 DIAGNOSIS — E1140 Type 2 diabetes mellitus with diabetic neuropathy, unspecified: Secondary | ICD-10-CM | POA: Diagnosis not present

## 2019-07-04 DIAGNOSIS — E1169 Type 2 diabetes mellitus with other specified complication: Secondary | ICD-10-CM

## 2019-07-04 NOTE — Progress Notes (Signed)
Patient ID: [name], female   DOB: [birth year], 68 y.o.   MRN: [id number] Complaint:  Visit Type: Patient returns to the office for preventative foot care services.  Patient states nails are painful walking and wearing her shoes.  She is unable to self treat.  Patient is diabetic.   Podiatric Exam: Vascular: dorsalis pedis and posterior tibial pulses are palpable bilateral. Capillary return is immediate. Temperature gradient is WNL. Skin turgor WNL  Sensorium: Diminished Semmes Weinstein monofilament test. Normal tactile sensation bilaterally. Nail Exam: Pt has thick disfigured discolored nails second and third toenails left foot. Ulcer Exam: There is no evidence of ulcer or pre-ulcerative changes or infection. Orthopedic Exam: Muscle tone and strength are WNL. No limitations in general ROM. No crepitus or effusions noted. Foot type and digits show no abnormalities. . Asymptomatic HAV B/L.  Pes planus noted. Skin: No Porokeratosis. No infection or ulcers  Diagnosis:  Onychomycosis  Diabetes  HAV  B/L  Pes planus  B/L  Treatment & Plan Procedures and Treatment: Debridement and grinding of long painful nails both feet.Marland Kitchen Marland KitchenRTC 3 months.  Patient desires diabetic insoles for her diabetic shoes.    Return Visit-Office Procedure: Patient instructed to return to the office for a follow up visit for scheduled preventive foot care services.   Gardiner Barefoot DPM

## 2019-07-05 ENCOUNTER — Other Ambulatory Visit: Payer: Self-pay | Admitting: Family Medicine

## 2019-07-05 DIAGNOSIS — E785 Hyperlipidemia, unspecified: Secondary | ICD-10-CM

## 2019-07-05 DIAGNOSIS — E1169 Type 2 diabetes mellitus with other specified complication: Secondary | ICD-10-CM

## 2019-08-01 ENCOUNTER — Encounter: Payer: Self-pay | Admitting: Family Medicine

## 2019-08-01 ENCOUNTER — Other Ambulatory Visit: Payer: Self-pay | Admitting: Family Medicine

## 2019-08-01 ENCOUNTER — Ambulatory Visit (INDEPENDENT_AMBULATORY_CARE_PROVIDER_SITE_OTHER): Payer: Medicare Other | Admitting: Family Medicine

## 2019-08-01 ENCOUNTER — Other Ambulatory Visit: Payer: Self-pay

## 2019-08-01 VITALS — BP 135/80 | HR 80 | Ht 62.0 in | Wt 216.2 lb

## 2019-08-01 DIAGNOSIS — Z23 Encounter for immunization: Secondary | ICD-10-CM | POA: Diagnosis not present

## 2019-08-01 DIAGNOSIS — I10 Essential (primary) hypertension: Secondary | ICD-10-CM

## 2019-08-01 DIAGNOSIS — R2681 Unsteadiness on feet: Secondary | ICD-10-CM

## 2019-08-01 DIAGNOSIS — M1000 Idiopathic gout, unspecified site: Secondary | ICD-10-CM

## 2019-08-01 DIAGNOSIS — E1169 Type 2 diabetes mellitus with other specified complication: Secondary | ICD-10-CM

## 2019-08-01 DIAGNOSIS — M21969 Unspecified acquired deformity of unspecified lower leg: Secondary | ICD-10-CM

## 2019-08-01 DIAGNOSIS — E7800 Pure hypercholesterolemia, unspecified: Secondary | ICD-10-CM

## 2019-08-01 LAB — POCT GLYCOSYLATED HEMOGLOBIN (HGB A1C): HbA1c, POC (controlled diabetic range): 9.1 % — AB (ref 0.0–7.0)

## 2019-08-01 NOTE — Assessment & Plan Note (Signed)
Not at goal.  Unable to increase metformin due to diarrhea.  She does not want to take another pill and reluctantly agrees to start insulin.  Has never checked her blood sugar but did use to give her husband insulin years ago.  Asked her to make appointment with Pharmacy for possible lantus start and education.  Im very open to other suggestions for diabetes treatment

## 2019-08-01 NOTE — Assessment & Plan Note (Signed)
Check labs.  No recent flares

## 2019-08-01 NOTE — Patient Instructions (Addendum)
Good to see you today!  Thanks for coming in.  I will call you if your tests are not good.  Otherwise I will send you a letter.  If you do not hear from me with in 2 weeks please call our office.     Make and appointment to see Dr Valentina Lucks our Pharmacy doctor about starting insulin  Decrease gabapentin to three tabs three times a day then try two tabs three times a day   See me back in 3 months or sooner if any problems

## 2019-08-01 NOTE — Assessment & Plan Note (Signed)
BP Readings from Last 3 Encounters:  08/01/19 135/80  06/07/19 (!) 142/85  04/19/19 122/74   At goal continue current medications

## 2019-08-01 NOTE — Progress Notes (Signed)
21 

## 2019-08-01 NOTE — Assessment & Plan Note (Signed)
High risk for falls.  Has completed Physical Therapy and been evaluated by neurology.  Will try to decrease her gabapentin to decrease sedation

## 2019-08-01 NOTE — Progress Notes (Signed)
Subjective  [name] is a 68 y.o. female is presenting with the following  DIABETES Disease Monitoring: Blood Sugar ranges(Severity) -not checking  Associated Symptoms- Polyuria/phagia/dipsia- no      Visual problems- no Medications: Compliance(Modifying factor) - brings in all medications.  Taking metformin 500 mg ER twice a day but causes loose stools and does not feel can increase dose Hypoglycemic symptoms- no Timing - continuous  HYPERTENSION Disease Monitoring  Home BP Monitoring (Severity) not checking Symptoms - Chest pain- no    Dyspnea- no Medications (Modifying factors) Compliance-  Has all her medication bottles. Lightheadedness-  no  Edema- no  Timing - continuous  Duration - years ROS - See HPI  GAIT INSTABILITY No recent falls but is frequently unsteady.  Completed Physical Therapy .  Using walker for ambulation.  Taking gabapentin 4 caps three times a day instead of three.  She is unsure if this helps her pain or not and is willing to decrease.  No focal leg weakness  Chief Complaint noted Review of Symptoms - see HPI PMH - Smoking status noted.    Objective Vital Signs reviewed BP 135/80   Pulse 80   Ht 5\' 2"  (1.575 m)   Wt 216 lb 3.2 oz (98.1 kg)   SpO2 100%   BMI 39.54 kg/m  Walks slowly with walker Heart - Regular rate and rhythm.  No murmurs, gallops or rubs.    Lungs:  Normal respiratory effort, chest expands symmetrically. Lungs are clear to auscultation, no crackles or wheezes. Extrem - no edema  Assessments/Plans  See after visit summary for details of patient instuctions  Essential hypertension BP Readings from Last 3 Encounters:  08/01/19 135/80  06/07/19 (!) 142/85  04/19/19 122/74   At goal continue current medications   Gait instability High risk for falls.  Has completed Physical Therapy and been evaluated by neurology.  Will try to decrease her gabapentin to decrease sedation   Gout Check labs.  No recent flares   Type 2  diabetes mellitus with diabetic foot deformity Not at goal.  Unable to increase metformin due to diarrhea.  She does not want to take another pill and reluctantly agrees to start insulin.  Has never checked her blood sugar but did use to give her husband insulin years ago.  Asked her to make appointment with Pharmacy for possible lantus start and education.  Im very open to other suggestions for diabetes treatment

## 2019-08-02 ENCOUNTER — Encounter: Payer: Self-pay | Admitting: Family Medicine

## 2019-08-02 LAB — LIPID PANEL
Chol/HDL Ratio: 2.7 ratio (ref 0.0–4.4)
Cholesterol, Total: 98 mg/dL — ABNORMAL LOW (ref 100–199)
HDL: 36 mg/dL — ABNORMAL LOW (ref >39)
LDL Chol Calc (NIH): 16 mg/dL (ref 0–99)
Triglycerides: 321 mg/dL — ABNORMAL HIGH (ref 0–149)
VLDL Cholesterol Cal: 46 mg/dL — ABNORMAL HIGH (ref 5–40)

## 2019-08-02 LAB — URIC ACID: Uric Acid: 6.9 mg/dL (ref 2.5–7.1)

## 2019-08-13 ENCOUNTER — Ambulatory Visit: Payer: Medicare Other | Admitting: Orthotics

## 2019-08-13 ENCOUNTER — Other Ambulatory Visit: Payer: Self-pay

## 2019-08-13 DIAGNOSIS — M722 Plantar fascial fibromatosis: Secondary | ICD-10-CM

## 2019-08-13 DIAGNOSIS — E11610 Type 2 diabetes mellitus with diabetic neuropathic arthropathy: Secondary | ICD-10-CM | POA: Diagnosis not present

## 2019-08-13 DIAGNOSIS — E1140 Type 2 diabetes mellitus with diabetic neuropathy, unspecified: Secondary | ICD-10-CM

## 2019-08-13 DIAGNOSIS — B351 Tinea unguium: Secondary | ICD-10-CM

## 2019-08-13 DIAGNOSIS — M2010 Hallux valgus (acquired), unspecified foot: Secondary | ICD-10-CM

## 2019-08-13 DIAGNOSIS — M2142 Flat foot [pes planus] (acquired), left foot: Secondary | ICD-10-CM | POA: Diagnosis not present

## 2019-08-13 DIAGNOSIS — M2141 Flat foot [pes planus] (acquired), right foot: Secondary | ICD-10-CM | POA: Diagnosis not present

## 2019-08-13 NOTE — Progress Notes (Signed)
Patient came in today to pick up diabetic custom inserts.  Same was well pleased with fit and function.   The patient could ambulate without any discomfort; there were no signs of any quality issues. The foot ortheses offered full contact with plantar surface and contoured the arch well.  Patient advised to contact us if any problems arise.  Patient also advised on how to report any issues.

## 2019-08-14 ENCOUNTER — Other Ambulatory Visit: Payer: Self-pay

## 2019-08-14 ENCOUNTER — Encounter: Payer: Self-pay | Admitting: Pharmacist

## 2019-08-14 ENCOUNTER — Telehealth: Payer: Self-pay | Admitting: *Deleted

## 2019-08-14 ENCOUNTER — Ambulatory Visit (INDEPENDENT_AMBULATORY_CARE_PROVIDER_SITE_OTHER): Payer: Medicare Other | Admitting: Pharmacist

## 2019-08-14 DIAGNOSIS — M21969 Unspecified acquired deformity of unspecified lower leg: Secondary | ICD-10-CM

## 2019-08-14 DIAGNOSIS — I10 Essential (primary) hypertension: Secondary | ICD-10-CM | POA: Diagnosis not present

## 2019-08-14 DIAGNOSIS — E1169 Type 2 diabetes mellitus with other specified complication: Secondary | ICD-10-CM | POA: Diagnosis not present

## 2019-08-14 DIAGNOSIS — E7800 Pure hypercholesterolemia, unspecified: Secondary | ICD-10-CM

## 2019-08-14 MED ORDER — LOSARTAN POTASSIUM 100 MG PO TABS: 100.0000 mg | ORAL_TABLET | Freq: Every day | ORAL | 3 refills | Status: DC

## 2019-08-14 MED ORDER — ONETOUCH DELICA LANCETS 30G MISC: 1.0000 | Freq: Every day | 99 refills | Status: DC | PRN

## 2019-08-14 MED ORDER — LIRAGLUTIDE 18 MG/3ML ~~LOC~~ SOPN: 0.6000 mg | PEN_INJECTOR | Freq: Every day | SUBCUTANEOUS | 0 refills | Status: DC

## 2019-08-14 MED ORDER — ONETOUCH DELICA LANCING DEV MISC: 1.0000 | Freq: Once | 0 refills | Status: AC

## 2019-08-14 MED ORDER — LIRAGLUTIDE 18 MG/3ML ~~LOC~~ SOPN: 0.6000 mg | PEN_INJECTOR | Freq: Every day | SUBCUTANEOUS | 3 refills | Status: DC

## 2019-08-14 MED ORDER — ONETOUCH VERIO W/DEVICE KIT: 1.0000 | PACK | Freq: Once | 0 refills | Status: AC

## 2019-08-14 NOTE — Progress Notes (Signed)
Reviewed: Agree with Dr. Koval's documentation and management. 

## 2019-08-14 NOTE — Patient Instructions (Addendum)
It was great to see you today!  Start Victoza (liraglutide) 0.6 mg dose Stop benazepril 20 mg Start losartan 100 mg dose once daily Decrease metformin XR from 500 mg twice a day to 500 mg once a day Continue taking Jardiance 10 mg once daily Take your blood glucose in the mornings

## 2019-08-14 NOTE — Telephone Encounter (Signed)
Received fax from pharmacy, allopurinol is out of stock.  Please send in alternative or send to a different pharmacy. Christen Bame, CMA

## 2019-08-14 NOTE — Assessment & Plan Note (Signed)
ASCVD risk - primary prevention in patient with DM. Total Cholesterol < 100 at most recent lipid panel.  LDL  Likely < 50.  - high intensity statin indicated. Aspirin is indicated.  -Continued aspirin 81 mg  -Continued atorvastatin 40 mg.

## 2019-08-14 NOTE — Assessment & Plan Note (Signed)
Hypertension longstanding and currently uncontrolled.  BP goal = <130/80 mmHg. Patient reports to be adherent with medication. Patient has highly resistant hypertension. -discontinued benazepril and started losartan 100 mg. New Rx sent to [facility]. Patient's last uric acid level was 6.9 (08/01/19), anticipate losartan will decrease uric acid to < 6.0

## 2019-08-14 NOTE — Progress Notes (Signed)
S:     Chief Complaint  Patient presents with  . Medication Management    diabetes    Patient arrives in good spirits, ambulating with use of "rolling chair" walker.  Presents for diabetes evaluation, education, and management.  Patient was referred and last seen by Primary Care Provider, [name] on 08/01/2019.   Patient reports Diabetes was diagnosed ~ 2000.   Family/Social History: significant for numerous relatives with hypertension.  Insurance coverage/medication affordability: UHC  Patient reports adherence with medications.  Current diabetes medications include: Metformin XR 500mg  Twice daily, Jardiance 10 mg Current hypertension medications include: benazepril, metoprolol succinate, spironolactone, furosemide Current hyperlipidemia medications include: None  Patient denies hypoglycemic events.  Patient-reported exercise habits: limited ambulation (uses walker)     O:  Physical Exam Vitals signs reviewed.  Constitutional:      Appearance: She is obese.  Pulmonary:     Effort: Pulmonary effort is normal.  Musculoskeletal:     Right lower leg: Edema present.     Left lower leg: No edema.     Comments: Trace edema on right   Neurological:     Mental Status: She is alert.  Psychiatric:        Mood and Affect: Mood normal.        Thought Content: Thought content normal.      Review of Systems  Cardiovascular: Positive for leg swelling.     Lab Results  Component Value Date   HGBA1C 9.1 (A) 08/01/2019   Vitals:   08/14/19 0944  BP: 134/78  Pulse: 80  SpO2: 94%    Lipid Panel     Component Value Date/Time   CHOL 98 (L) 08/01/2019 1311   TRIG 321 (H) 08/01/2019 1311   HDL 36 (L) 08/01/2019 1311   CHOLHDL 2.7 08/01/2019 1311   CHOLHDL 5.9 01/07/2016 1635   VLDL UNABLE TO CALCULATE IF TRIGLYCERIDE OVER 400 mg/dL 01/07/2016 1635   LDLCALC UNABLE TO CALCULATE IF TRIGLYCERIDE OVER 400 mg/dL 01/07/2016 1635   LDLDIRECT 36 03/31/2016 1027    Patient did not have a glucometer at home. Requested new request for meter.  States she will check.     A/P: Diabetes longstanding and currently uncontrolled. Patient reports adherence with medication. -Started GLP-1 Victoza (generic name liraglutide) 0.6 mg once daily. Will follow up with patient in 2 weeks to discuss increasing to 1.2 mg. Counseled on injection technique. Patient demonstrated adequate technique while administering first dose in office with sample provided. Sent new Rx to [facility]. -Continued SGLT2-I Jardiance (generic name empagliflozin) at 10 mg daily. May increase at a later date. -Extensively discussed pathophysiology of DM, recommended lifestyle interventions, dietary effects on glycemic control -Counseled on s/sx of and management of hypoglycemia - New Onetouch Verio meter Rx sent ot pharmacy with supplies. -Next A1C anticipated in December at PCP visit with [name].   ASCVD risk - primary prevention in patient with DM. Total Cholesterol < 100 at most recent lipid panel.  LDL  Likely < 50.  - high intensity statin indicated. Aspirin is indicated.  -Continued aspirin 81 mg  -Continued atorvastatin 40 mg.   Hypertension longstanding and currently uncontrolled.  BP goal = <130/80 mmHg. Patient reports to be adherent with medication. Patient has highly resistant hypertension. -discontinued benazepril and started losartan 100 mg. New Rx sent to [facility]. Patient's last uric acid level was 6.9 (08/01/19), anticipate losartan will decrease uric acid to < 6.0  Written patient instructions provided.  Total time in face to face counseling 60 minutes.   Follow up phone call with Pharmacy in 2 weeks. Plan on Clinic Visit in 4-6 weeks with Pharmacy.   Patient seen with Murlean Iba, PharmD Candidate and Gerre Pebbles, PharmD PGY-1 Resident.

## 2019-08-14 NOTE — Assessment & Plan Note (Signed)
Diabetes longstanding and currently uncontrolled. Patient reports adherence with medication. -Started GLP-1 Victoza (generic name liraglutide) 0.6 mg once daily. Will follow up with patient in 2 weeks to discuss increasing to 1.2 mg. Counseled on injection technique. Patient demonstrated adequate technique while administering first dose in office with sample provided. Sent new Rx to [facility]. -Continued SGLT2-I Jardiance (generic name empagliflozin) at 10 mg daily. May increase at a later date. -Extensively discussed pathophysiology of DM, recommended lifestyle interventions, dietary effects on glycemic control -Counseled on s/sx of and management of hypoglycemia - New Onetouch Verio meter Rx sent ot pharmacy with supplies.

## 2019-08-22 ENCOUNTER — Other Ambulatory Visit: Payer: Self-pay | Admitting: *Deleted

## 2019-08-22 NOTE — Patient Outreach (Signed)
[facility]) Care Management  [id number]   [name] [id number]  RN Health Coach telephone call to patient.  Hipaa compliance verified. Per patient she is expecting her meter to come today. Patient A1C is 9.1. Patient had not received meter from [id number] request. Patient is now being placed on insulin. Patient is trying to eat healthier. RN will contact physician office for nutritional classes.    Current Medications:  Current Outpatient Medications  Medication Sig Dispense Refill  . acetaminophen (TYLENOL 8 HOUR ARTHRITIS PAIN) 650 MG CR tablet Take 650 mg by mouth every 8 (eight) hours as needed for pain.    Marland Kitchen allopurinol (ZYLOPRIM) 100 MG tablet TAKE 1 TABLET(100 MG) BY MOUTH DAILY 90 tablet 0  . aspirin 81 MG tablet Take 81 mg by mouth daily.    Marland Kitchen atorvastatin (LIPITOR) 40 MG tablet TAKE 1 TABLET BY MOUTH ONCE DAILY 90 tablet 2  . colchicine 0.6 MG tablet TAKE 1 TABLET BY MOUTH  DAILY 90 tablet 3  . dorzolamide-timolol (COSOPT) 22.3-6.8 MG/ML ophthalmic solution Place 1 drop into both eyes 2 (two) times daily.  0  . exemestane (AROMASIN) 25 MG tablet Take 1 tablet (25 mg total) by mouth daily after breakfast. Take 1 tab daily 90 tablet 3  . fluticasone (FLOVENT HFA) 110 MCG/ACT inhaler INHALE 2 PUFFS BY MOUTH TWICE DAILY (Patient taking differently: Inhale 2 puffs into the lungs at bedtime as needed. INHALE 2 PUFFS BY MOUTH TWICE DAILY) 12 g 3  . furosemide (LASIX) 20 MG tablet Take 1 tablet (20 mg total) by mouth every other day. 90 tablet 2  . gabapentin (NEURONTIN) 100 MG capsule TAKE 3 CAPSULES BY MOUTH 3  TIMES DAILY 540 capsule 0  . JARDIANCE 10 MG TABS tablet TAKE 1 TABLET BY MOUTH DAILY 90 tablet 1  . latanoprost (XALATAN) 0.005 % ophthalmic solution Place 1 drop into both eyes at bedtime.   0  . liraglutide (VICTOZA) 18 MG/3ML SOPN Inject 0.1-0.2 mLs (0.6-1.2 mg total) into the skin daily with breakfast. 0.6 mg once daily for 1 week,then  increase to 1.2 mg once daily,max 1.8  mg 1 pen 0  . losartan (COZAAR) 100 MG tablet Take 1 tablet (100 mg total) by mouth at bedtime. 90 tablet 3  . metFORMIN (GLUCOPHAGE-XR) 500 MG 24 hr tablet TAKE 1 TABLET BY MOUTH  TWICE DAILY 180 tablet 3  . metoprolol succinate (TOPROL-XL) 25 MG 24 hr tablet Take 25 mg by mouth daily.  90 tablet 3  . OneTouch Delica Lancets [id number] MISC 1 Container by Does not apply route daily as needed. 100 each prn  . spironolactone (ALDACTONE) 25 MG tablet Take 1 tablet (25 mg total) by mouth daily. 90 tablet 1   No current facility-administered medications for this visit.     Functional Status:  In your present state of health, do you have any difficulty performing the following activities: 08/22/2019 06/21/2019  Hearing? N N  Vision? N N  Difficulty concentrating or making decisions? N N  Comment - -  Walking or climbing stairs? Y Y  Comment due to knee pain and ambulates with rollator walker Per patient due to the knee pain  Dressing or bathing? N N  Doing errands, shopping? Y Y  Comment Daughter does errands Her daughter does the Science writer and eating ? N N  Using the Toilet? N N  In the past six months, have you accidently leaked urine? N N  Do  you have problems with loss of bowel control? N -  Managing your Medications? N N  Managing your Finances? N N  Housekeeping or managing your Housekeeping? Tempie Donning  Comment daughter assists -  Some recent data might be hidden    Fall/Depression Screening: Fall Risk  08/22/2019 08/01/2019 06/21/2019  Falls in the past year? 1 1 0  Number falls in past yr: 0 0 -  Injury with Fall? 1 1 -  Risk Factor Category  - - -  Risk for fall due to : History of fall(s);Impaired balance/gait;Impaired mobility;Impaired vision Impaired balance/gait;Impaired vision Impaired balance/gait;Impaired mobility  Risk for fall due to: Comment - - -  Follow up Falls evaluation completed;Falls prevention discussed - -  Comment - -  -   PHQ 2/9 Scores 08/22/2019 06/21/2019 04/19/2019 04/06/2019 02/07/2019 01/30/2019 11/14/2018  PHQ - 2 Score 1 1 1  0 0 0 0  PHQ- 9 Score - - - - - - -   THN CM Care Plan Problem One     Most Recent Value  Care Plan Problem One  Knowledge Deficit in self management of Diabetes  Role Documenting the Problem One  [place] for Problem One  Active  THN Long Term Goal   Patient will see a decrease in A1C from 10.1 with next blood draw  Interventions for Problem One Long Term Goal  Patient A12C 9.1 from 10. Patient encouraged patient to continue. RN discussed checking blood sugars. RN will follow up with further discussion  THN CM Short Term Goal #1   Patient will be able to make healthier choices with snacks within the next 30 days  Interventions for Short Term Goal #1  Patient is learning food described as healthy snacks. RN is requesting nutritional classes to assist patient in better food choices. RN will follow up with further discussion  THN CM Short Term Goal #2   Patient will be able to verbalize symptoms of high and low blood sugars and the action plan within the next 30 days  Interventions for Short Term Goal #2  Patient received list of hypo and hyperglycemia and is starting insulin. RN will follow up with further discussions  THN CM Short Term Goal #3  Patient will be able to verbalize making health maintenance appointments. RN sent educational material on Diabetes taking care of yourself throughout the year. RN sent Diabetes taking care of yourself day to day. RN will follow up with further outreach for compliance.  THN CM Short Term Goal #3 Start Date  01/30/19  Interventions for Short Tern Goal #3  RN discussed health maintenance care. Patient will schedule follow up flu shot and eye exam. RN will follow up for compliance  THN CM Short Term Goal #4  Patient will verbalize understanding healthy food choices from dining out within the next 30 days  Interventions for Short Term  Goal #4  RN reiterated healthy dining from fast food places. RN is asking for referral to nutritional classes. RN will follow up with further discussion  THN CM Short Term Goal #5   Patient will check blood sugaurs and document within the next 30 days  THN CM Short Term Goal #5 Start Date  08/22/19  Interventions for Short Term Goal #5  RN discussed checking blood sugars. Patientis to receive her meter today. RN will send a calendar book to record blood sugars in. RN ill follow up with further discussion.      Assessment: A1C 9.1  Patient  Is looking for meter to arrive today Patient has not been checking herr blood sugars Patient will benefit from [place] telephonic outreach for education and support for diabetes self management.   Plan:  RN sent patient a 2020 Calendar book to record blood sugars RN called Dr office for patient enrollment in Diabetes nutritional classes RN sent patient a sheet on rotating injection sites RN sent EMMI on why she should check blood sugars RN discussed healthy eating RN will follow up within the month of October for progress  [place] [facility] [phone]

## 2019-08-24 ENCOUNTER — Telehealth: Payer: Self-pay

## 2019-08-24 MED ORDER — GLUCOSE BLOOD VI STRP: 1.0000 | ORAL_STRIP | 3 refills | Status: DC

## 2019-08-24 NOTE — Addendum Note (Signed)
Addended by: Talbert Cage L on: 08/24/2019 03:34 PM   Modules accepted: Orders

## 2019-08-24 NOTE — Telephone Encounter (Signed)
Patient calls nurse line stating she needs a refill on test strips [facility].   I did not see any on med list. Please advise.

## 2019-08-24 NOTE — Telephone Encounter (Signed)
One touch delica plus.

## 2019-08-24 NOTE — Telephone Encounter (Signed)
Attempted to call patient to clarify which test strips she needed.  There was no answer so a voicemail was left to call office.  [name], [name]

## 2019-08-24 NOTE — Telephone Encounter (Signed)
Please contact her and find out the exact name and type of strips that she needs  thanks

## 2019-09-03 ENCOUNTER — Other Ambulatory Visit: Payer: Self-pay | Admitting: Family Medicine

## 2019-09-04 ENCOUNTER — Other Ambulatory Visit: Payer: Self-pay | Admitting: *Deleted

## 2019-09-04 ENCOUNTER — Telehealth: Payer: Self-pay | Admitting: *Deleted

## 2019-09-04 DIAGNOSIS — M21969 Unspecified acquired deformity of unspecified lower leg: Secondary | ICD-10-CM

## 2019-09-04 DIAGNOSIS — E1169 Type 2 diabetes mellitus with other specified complication: Secondary | ICD-10-CM

## 2019-09-04 NOTE — Telephone Encounter (Signed)
Dub Mikes, RN @ [facility] called and is requesting a referral to DM/nuttition center.  She discussed this with the patient and she is interested in giving this a try to help her A1c.    To PCP. Christen Bame, CMA

## 2019-09-04 NOTE — Telephone Encounter (Signed)
Pt calls because she needs a script for her novafine pen needles [facility] to [facility].  These are not on her med list.  I have ordered and will send to MD for review and signature. Christen Bame, CMA

## 2019-09-05 MED ORDER — "PEN NEEDLES 5/16"" 30G X 8 MM MISC": 12 refills | Status: DC

## 2019-09-05 NOTE — Telephone Encounter (Signed)
Referral was placed Thanks [place]

## 2019-09-17 ENCOUNTER — Encounter: Payer: Self-pay | Admitting: Family Medicine

## 2019-09-17 DIAGNOSIS — H04551 Acquired stenosis of right nasolacrimal duct: Secondary | ICD-10-CM | POA: Diagnosis not present

## 2019-09-17 DIAGNOSIS — E119 Type 2 diabetes mellitus without complications: Secondary | ICD-10-CM | POA: Diagnosis not present

## 2019-09-17 DIAGNOSIS — Z961 Presence of intraocular lens: Secondary | ICD-10-CM | POA: Diagnosis not present

## 2019-09-17 DIAGNOSIS — H538 Other visual disturbances: Secondary | ICD-10-CM | POA: Diagnosis not present

## 2019-09-17 LAB — HM DIABETES EYE EXAM

## 2019-09-18 ENCOUNTER — Other Ambulatory Visit: Payer: Self-pay | Admitting: *Deleted

## 2019-09-18 NOTE — Patient Outreach (Signed)
[facility]) Care Management  [id number]  LORILYN MONCAYO [id number]   RN Health Coach called [place] and Diabetes education services to make sure they received the referral . [place] telephone call to patient.  Hipaa compliance verified. Per patient she did received the call  for Diabetic nutritional services and will start classes on November 20 th at  930. Per patient she is just waiting on the paperwork to come.    Plan: RN will follow up with patient within the month of December.   [place] Care Management [phone]

## 2019-09-21 ENCOUNTER — Telehealth: Payer: Self-pay

## 2019-10-02 NOTE — Telephone Encounter (Signed)
error 

## 2019-10-10 ENCOUNTER — Other Ambulatory Visit: Payer: Self-pay

## 2019-10-10 ENCOUNTER — Encounter: Payer: Self-pay | Admitting: Podiatry

## 2019-10-10 ENCOUNTER — Ambulatory Visit (INDEPENDENT_AMBULATORY_CARE_PROVIDER_SITE_OTHER): Payer: Medicare Other | Admitting: Podiatry

## 2019-10-10 DIAGNOSIS — M2010 Hallux valgus (acquired), unspecified foot: Secondary | ICD-10-CM

## 2019-10-10 DIAGNOSIS — B351 Tinea unguium: Secondary | ICD-10-CM

## 2019-10-10 DIAGNOSIS — E1140 Type 2 diabetes mellitus with diabetic neuropathy, unspecified: Secondary | ICD-10-CM | POA: Diagnosis not present

## 2019-10-10 NOTE — Progress Notes (Signed)
Patient ID: [name], female   DOB: [birth year], 68 y.o.   MRN: [id number] Complaint:  Visit Type: Patient returns to the office for preventative foot care services.  Patient states nails are painful walking and wearing her shoes.  She is unable to self treat.  Patient is diabetic.   Podiatric Exam: Vascular: dorsalis pedis and posterior tibial pulses are palpable bilateral. Capillary return is immediate. Temperature gradient is WNL. Skin turgor WNL  Sensorium: Diminished Semmes Weinstein monofilament test. Normal tactile sensation bilaterally. Nail Exam: Pt has thick disfigured discolored nails second and third toenails left foot. Ulcer Exam: There is no evidence of ulcer or pre-ulcerative changes or infection. Orthopedic Exam: Muscle tone and strength are WNL. No limitations in general ROM. No crepitus or effusions noted. Foot type and digits show no abnormalities. . Asymptomatic HAV B/L.  Pes planus noted. Skin: No Porokeratosis. No infection or ulcers  Diagnosis:  Onychomycosis  Diabetes  HAV  B/L  Pes planus  B/L  Treatment & Plan Procedures and Treatment: Debridement and grinding of long painful nails both feet.Marland Kitchen Marland KitchenRTC 3 months.  Patient has received her diabetic insoles.    Return Visit-Office Procedure: Patient instructed to return to the office for a follow up visit for scheduled preventive foot care services.   Gardiner Barefoot DPM

## 2019-10-11 ENCOUNTER — Other Ambulatory Visit: Payer: Self-pay | Admitting: Family Medicine

## 2019-10-15 ENCOUNTER — Other Ambulatory Visit: Payer: Self-pay | Admitting: Family Medicine

## 2019-10-18 DIAGNOSIS — N3020 Other chronic cystitis without hematuria: Secondary | ICD-10-CM | POA: Diagnosis not present

## 2019-10-18 DIAGNOSIS — N1330 Unspecified hydronephrosis: Secondary | ICD-10-CM | POA: Diagnosis not present

## 2019-10-18 DIAGNOSIS — N281 Cyst of kidney, acquired: Secondary | ICD-10-CM | POA: Diagnosis not present

## 2019-10-19 ENCOUNTER — Encounter: Payer: Self-pay | Admitting: Dietician

## 2019-10-19 ENCOUNTER — Other Ambulatory Visit: Payer: Self-pay

## 2019-10-19 ENCOUNTER — Encounter: Payer: Medicare Other | Attending: Family Medicine | Admitting: Dietician

## 2019-10-19 DIAGNOSIS — E1169 Type 2 diabetes mellitus with other specified complication: Secondary | ICD-10-CM | POA: Diagnosis not present

## 2019-10-19 DIAGNOSIS — M21969 Unspecified acquired deformity of unspecified lower leg: Secondary | ICD-10-CM | POA: Insufficient documentation

## 2019-10-19 NOTE — Patient Instructions (Signed)
Remember to start paying attention to the foods you eat and which ones have carbohydrates in them (starches and sugars.) Use your handouts from today to start counting your servings of carbohydrates.  2 to 3 servings of carbohydrates per meal   0 to 2 servings of carbohydrates per snack    Try to always have protein food(s) with all of your meals and snacks. Use the "Meal Ideas" and "Balanced Snacks" sheets for ideas.

## 2019-10-19 NOTE — Progress Notes (Signed)
Diabetes Self-Management Education  Visit Type: First/Initial  Appt. Start Time: 9:30am Appt. End Time: 10:30am  10/19/2019  [name], identified by name and date of birth, is a 68 y.o. female with a diagnosis of Diabetes: Type 2.   ASSESSMENT  Diabetes Self-Management Education - 10/19/19 0923      Visit Information   Visit Type  First/Initial      Initial Visit   Diabetes Type  Type 2    Are you currently following a meal plan?  No    Are you taking your medications as prescribed?  Yes    Date Diagnosed  1990s      Health Coping   How would you rate your overall health?  Good      Psychosocial Assessment   Patient Belief/Attitude about Diabetes  Motivated to manage diabetes    Self-care barriers  None;Impaired vision    Self-management support  Doctor's office    Other persons present  Patient    Patient Concerns  Nutrition/Meal planning;Weight Control    Special Needs  None    Preferred Learning Style  No preference indicated    Learning Readiness  Contemplating    How often do you need to have someone help you when you read instructions, pamphlets, or other written materials from your doctor or pharmacy?  1 - Never    What is the last grade level you completed in school?  2 yrs college      Complications   Last HgB A1C per patient/outside source  9.1 %    How often do you check your blood sugar?  1-2 times/day    Fasting Blood glucose range (mg/dL)  >200    Have you had a dilated eye exam in the past 12 months?  Yes    Have you had a dental exam in the past 12 months?  Yes    Are you checking your feet?  Yes    How many days per week are you checking your feet?  7      Dietary Intake   Breakfast  11am bacon + eggs + toast   or sausage + grits (or cereal + milk)   Snack (morning)  pretzels   or chips   Lunch  skips    Snack (afternoon)  cashews   or cookies or candy   Dinner  5:30pm meat + starch + vegetable    Snack (evening)  cake   or ice cream,  or apple/banana   Beverage(s)  water (daily), soda (every other day)      Exercise   Exercise Type  ADL's    How many days per week to you exercise?  2    How many minutes per day do you exercise?  30    Total minutes per week of exercise  60      Patient Education   Previous Diabetes Education  No    Disease state   Definition of diabetes, type 1 and 2, and the diagnosis of diabetes    Nutrition management   Role of diet in the treatment of diabetes and the relationship between the three main macronutrients and blood glucose level;Carbohydrate counting;Information on hints to eating out and maintain blood glucose control.;Meal options for control of blood glucose level and chronic complications.      Individualized Goals (developed by patient)   Nutrition  Follow meal plan discussed    Medications  take my medication as prescribed  Monitoring   test my blood glucose as discussed   fasting 3-5 days/week per MD     Outcomes   Expected Outcomes  Demonstrated interest in learning. Expect positive outcomes    Future DMSE  PRN    Program Status  Completed       Individualized Plan for Diabetes Self-Management Training:   Learning Objective:  Patient will have a greater understanding of diabetes self-management. Patient education plan is to attend individual and/or group sessions per assessed needs and concerns.   Plan:  Patient Instructions  Remember to start paying attention to the foods you eat and which ones have carbohydrates in them (starches and sugars.) Use your handouts from today to start counting your servings of carbohydrates.  2 to 3 servings of carbohydrates per meal   0 to 2 servings of carbohydrates per snack    Try to always have protein food(s) with all of your meals and snacks. Use the "Meal Ideas" and "Balanced Snacks" sheets for ideas.    Expected Outcomes:  Demonstrated interest in learning. Expect positive outcomes  Education material provided: Meal  plan card, My Plate, Snack sheet and Carbohydrate counting sheet  If problems or questions, patient to contact team via:  Phone and Email  Future DSME appointment: PRN

## 2019-10-25 ENCOUNTER — Other Ambulatory Visit: Payer: Self-pay | Admitting: Family Medicine

## 2019-10-30 ENCOUNTER — Ambulatory Visit: Payer: Medicare Other | Admitting: Family Medicine

## 2019-11-01 ENCOUNTER — Other Ambulatory Visit: Payer: Self-pay | Admitting: Family Medicine

## 2019-11-13 ENCOUNTER — Ambulatory Visit (INDEPENDENT_AMBULATORY_CARE_PROVIDER_SITE_OTHER): Payer: Medicare Other | Admitting: Family Medicine

## 2019-11-13 ENCOUNTER — Other Ambulatory Visit: Payer: Self-pay

## 2019-11-13 ENCOUNTER — Encounter: Payer: Self-pay | Admitting: Family Medicine

## 2019-11-13 VITALS — BP 140/74 | HR 75 | Wt 221.6 lb

## 2019-11-13 DIAGNOSIS — M21969 Unspecified acquired deformity of unspecified lower leg: Secondary | ICD-10-CM | POA: Diagnosis not present

## 2019-11-13 DIAGNOSIS — E7800 Pure hypercholesterolemia, unspecified: Secondary | ICD-10-CM | POA: Diagnosis not present

## 2019-11-13 DIAGNOSIS — I10 Essential (primary) hypertension: Secondary | ICD-10-CM

## 2019-11-13 DIAGNOSIS — E1169 Type 2 diabetes mellitus with other specified complication: Secondary | ICD-10-CM | POA: Diagnosis not present

## 2019-11-13 LAB — POCT GLYCOSYLATED HEMOGLOBIN (HGB A1C): HbA1c, POC (controlled diabetic range): 9.2 % — AB (ref 0.0–7.0)

## 2019-11-13 NOTE — Progress Notes (Signed)
Subjective  [name] is a 68 y.o. female is presenting with the following  HYPERTENSION Disease Monitoring: Blood pressure range-not checking Chest pain, palpitations- no      Dyspnea- no Medications: Compliance- brings all meds Lightheadedness,Syncope- no   Edema- no  DIABETES Disease Monitoring: Blood Sugar ranges-in the 200s none below upper 100s Polyuria/phagia/dipsia- no      Visual problems- no Medications: Compliance- is still taking Victoza 0.6 has never gone up on dose Hypoglycemic symptoms- no  HYPERLIPIDEMIA Disease Monitoring: See symptoms for Hypertension Medications: Compliance- lipitor daily Right upper quadrant pain- no  Muscle aches- no        Chief Complaint noted Review of Symptoms - see HPI PMH - Smoking status noted.    Objective Vital Signs reviewed BP 140/74   Pulse 75   Wt 221 lb 9.6 oz (100.5 kg)   SpO2 99%   BMI 38.83 kg/m  Good spirits Heart - Regular rate and rhythm.  No murmurs, gallops or rubs.    Lungs:  Normal respiratory effort, chest expands symmetrically. Lungs are clear to auscultation, no crackles or wheezes. No edema Assessments/Plans  Essential hypertension BP Readings from Last 3 Encounters:  11/13/19 140/74  08/14/19 134/78  08/01/19 135/80   At goal  Type 2 diabetes mellitus with diabetic foot deformity Not at goal.  Likely because did not increase Liraglutide.  Will increase this as well as take metformin twice a day.   If not at goal may increase empaglaflozin   Hyperlipidemia Lab Results  Component Value Date   CHOL 98 (L) 08/01/2019   HDL 36 (L) 08/01/2019   LDLCALC 16 08/01/2019   LDLDIRECT 36 03/31/2016   TRIG 321 (H) 08/01/2019   CHOLHDL 2.7 08/01/2019   Continue statin    See after visit summary for details of patient instructions

## 2019-11-13 NOTE — Assessment & Plan Note (Signed)
Lab Results  Component Value Date   CHOL 98 (L) 08/01/2019   HDL 36 (L) 08/01/2019   LDLCALC 16 08/01/2019   LDLDIRECT 36 03/31/2016   TRIG 321 (H) 08/01/2019   CHOLHDL 2.7 08/01/2019   Continue statin

## 2019-11-13 NOTE — Patient Instructions (Addendum)
Good to see you today!  Thanks for coming in.  For the diabetes - Take metformin twice a day  - Increase the Victoza to 1.2 for one week then go up to 1.8 - Call me with any low blood sugars - Call in your blood sugar in one month   For the shaking - Decrease gabapentin to 2 caps three times a day if not bothering you go down to 1 cap three times a day  Come back in 3 months for a diabetes checs

## 2019-11-13 NOTE — Assessment & Plan Note (Signed)
BP Readings from Last 3 Encounters:  11/13/19 140/74  08/14/19 134/78  08/01/19 135/80   At goal

## 2019-11-13 NOTE — Assessment & Plan Note (Signed)
Not at goal.  Likely because did not increase Liraglutide.  Will increase this as well as take metformin twice a day.   If not at goal may increase empaglaflozin

## 2019-11-27 ENCOUNTER — Other Ambulatory Visit: Payer: Self-pay | Admitting: *Deleted

## 2019-11-27 NOTE — Patient Outreach (Signed)
[facility]) Care Management  [id number]  DEDRA MEISER [id number] [id number]   RN Health Coach attempted follow up outreach call to patient.  Patient was unavailable. HIPPA compliance voicemail message left with return callback number.  Plan: RN will call patient again within 30 days.  [place] Care Management [phone]

## 2019-11-28 NOTE — Patient Outreach (Signed)
[facility]) Care Management  [place]  13/[birth year]77   [name] [birth year] [phone]  [place] received telephone call from patient.  Hipaa compliance verified. Per patient she went to the nutritional class. Per patient she did learn some new things. Patient went to podiatrist for foot exam. Per patient she knows her weakness is sweets and she has been eating too many. Patient stated her goal will be to cut back on sweets. Patient A1C went from 9.1 to 9.2. RN explained that she needs to adhere to her diet to see better results. Dr has increased patient oral agents. RN went over the doses and discussed medication adherence. RN discussed patient cholesterol levels.Patient has agreed to further outreach calls.   Encounter Medications:  Outpatient Encounter Medications as of 11/27/2019  Medication Sig Note  . acetaminophen (TYLENOL 8 HOUR ARTHRITIS PAIN) 650 MG CR tablet Take 650 mg by mouth every 8 (eight) hours as needed for pain. 08/14/2019: Takes 2 tablets in the morning, 1 tablet at noon, and 2 tablets at nights  . allopurinol (ZYLOPRIM) 100 MG tablet TAKE 1 TABLET BY MOUTH  DAILY   . aspirin 81 MG tablet Take 81 mg by mouth daily.   Marland Kitchen atorvastatin (LIPITOR) 40 MG tablet TAKE 1 TABLET BY MOUTH ONCE DAILY   . Blood Glucose Monitoring Suppl (ONETOUCH VERIO FLEX SYSTEM) w/Device KIT USE AS DIRECTED   . colchicine 0.6 MG tablet TAKE 1 TABLET BY MOUTH  DAILY   . dorzolamide-timolol (COSOPT) 22.3-6.8 MG/ML ophthalmic solution Place 1 drop into both eyes 2 (two) times daily.   Marland Kitchen exemestane (AROMASIN) 25 MG tablet Take 1 tablet (25 mg total) by mouth daily after breakfast. Take 1 tab daily   . fluticasone (FLOVENT HFA) 110 MCG/ACT inhaler INHALE 2 PUFFS BY MOUTH TWICE DAILY (Patient not taking: Reported on 11/13/2019)   . furosemide (LASIX) 20 MG tablet Take 1 tablet (20 mg total) by mouth every other day.   . gabapentin (NEURONTIN) 100 MG capsule TAKE 3  CAPSULES BY MOUTH 3  TIMES DAILY   . glucose blood test strip 1 each by Other route every morning. Use as instructed   . Insulin Pen Needle (PEN NEEDLES 5/16") 30G X 8 MM MISC Use to inject insulin as directed.  Dx code:E11.69   . JARDIANCE 10 MG TABS tablet TAKE 1 TABLET BY MOUTH DAILY   . Lancet Devices (ONETOUCH DELICA PLUS LANCING) MISC    . latanoprost (XALATAN) 0.005 % ophthalmic solution Place 1 drop into both eyes at bedtime.    . liraglutide (VICTOZA) 18 MG/3ML SOPN Inject 0.1-0.2 mLs (0.6-1.2 mg total) into the skin daily with breakfast. 0.6 mg once daily for 1 week,then increase to 1.2 mg once daily,max 1.8  mg   . losartan (COZAAR) 100 MG tablet Take 1 tablet (100 mg total) by mouth at bedtime.   . metFORMIN (GLUCOPHAGE-XR) 500 MG 24 hr tablet TAKE 1 TABLET BY MOUTH  TWICE DAILY   . metoprolol succinate (TOPROL-XL) 25 MG 24 hr tablet TAKE 1 TABLET BY MOUTH  DAILY   . OneTouch Delica Lancets 10U MISC 1 Container by Does not apply route daily as needed.   Marland Kitchen spironolactone (ALDACTONE) 25 MG tablet TAKE 1 TABLET BY MOUTH  DAILY    No facility-administered encounter medications on file as of 11/27/2019.    Functional Status:  In your present state of health, do you have any difficulty performing the following activities: 11/27/2019 08/22/2019  Hearing?  N N  Vision? N N  Difficulty concentrating or making decisions? N N  Comment - -  Walking or climbing stairs? Y Y  Comment - due to knee pain and ambulates with rollator walker  Dressing or bathing? N N  Doing errands, shopping? Y Y  Comment - Daughter does Science writer and eating ? - N  Using the Toilet? - N  In the past six months, have you accidently leaked urine? - N  Do you have problems with loss of bowel control? - N  Managing your Medications? - N  Managing your Finances? - N  Housekeeping or managing your Housekeeping? - Y  Comment - daughter assists  Some recent data might be hidden    Fall/Depression  Screening: Fall Risk  11/27/2019 11/13/2019 10/19/2019  Falls in the past year? - 0 0  Number falls in past yr: 0 0 -  Injury with Fall? 1 - -  Risk Factor Category  - - -  Risk for fall due to : History of fall(s);Impaired balance/gait;Impaired mobility;Impaired vision - -  Risk for fall due to: Comment - - -  Follow up Falls evaluation completed;Falls prevention discussed - -  Comment - - -   PHQ 2/9 Scores 11/27/2019 11/13/2019 10/19/2019 08/22/2019 06/21/2019 04/19/2019 04/06/2019  PHQ - 2 Score 1 1 0 1 1 1  0  PHQ- 9 Score - - - - - - -   THN CM Care Plan Problem One     Most Recent Value  Care Plan Problem One  Knowledge Deficit in self management of Diabetes  Role Documenting the Problem One  [place] for Problem One  Active  THN Long Term Goal   Patient will see a decrease in A1C from 9.1 with next blood draw  THN Long Term Goal Start Date  11/27/19  Interventions for Problem One Long Term Goal  RN discussed what the A1C is now. RN discussed eating habits. RN discussed medication adherence. RN discussed the changes in her medications. RN will follow up with further discussion  THN CM Short Term Goal #1   Patient will be able to make healthier choices with snacks within the next 30 days  Interventions for Short Term Goal #1  RN reiterated eating healthier. RN discussed the choices that the patient has been making. RN will follow up withfurther discussion  THN CM Short Term Goal #2   Patient will be able to verbalize symptoms of high and low blood sugars and the action plan within the next 30 days  THN CM Short Term Goal #3  Patient will be able to verbalize making health maintenance appointments. RN sent educational material on Diabetes taking care of yourself throughout the year. RN sent Diabetes taking care of yourself day to day. RN will follow up with further outreach for compliance.  THN CM Short Term Goal #3 Start Date  11/27/19  Interventions for Short Tern Goal #3   RN reiterated health maintenance care. Patient has followed up with a diabetic nutritional class but needs additional education. RN will follow upwith further discussion  THN CM Short Term Goal #4  Patient will verbalize understanding healthy food choices from dining out within the next 30 days  THN CM Short Term Goal #4 Start Date  11/27/19  [facility] CM Short Term Goal #4 Met Date  11/27/19  THN CM Short Term Goal #5   Patient will check blood sugaurs and document within the next 30  days  Interventions for Short Term Goal #5  RN reiterated checking her blood sugars and documenting. RN will follow up for compliance.      Assessment:  A1C is 9.2 from 9.1 Per patient she is still not adhering to diet loves sweets Patient understands she needs to monitor diet also for cholesterol Patient will continue to benefit from [place] telephonic outreach for education and support for diabetes self management.  Plan: RN discussed A1C RN sent 2021 Calendar book RN sent educational information on cholesterol readings and meanings RN will follow up within the month of March  [place] Management [phone]

## 2019-12-06 ENCOUNTER — Telehealth: Payer: Self-pay | Admitting: Hematology

## 2019-12-06 NOTE — Telephone Encounter (Signed)
Returned patient's phone call regarding rescheduling an appointment, per patient's request appointment has moved to 02/01.

## 2019-12-10 ENCOUNTER — Other Ambulatory Visit: Payer: Self-pay | Admitting: Family Medicine

## 2019-12-10 ENCOUNTER — Inpatient Hospital Stay: Payer: Medicare Other | Admitting: Hematology

## 2019-12-10 ENCOUNTER — Inpatient Hospital Stay: Payer: Medicare Other

## 2019-12-10 DIAGNOSIS — E1169 Type 2 diabetes mellitus with other specified complication: Secondary | ICD-10-CM

## 2019-12-13 ENCOUNTER — Other Ambulatory Visit: Payer: Self-pay | Admitting: Family Medicine

## 2019-12-13 DIAGNOSIS — M21969 Unspecified acquired deformity of unspecified lower leg: Secondary | ICD-10-CM

## 2019-12-13 DIAGNOSIS — E1169 Type 2 diabetes mellitus with other specified complication: Secondary | ICD-10-CM

## 2019-12-13 MED ORDER — VICTOZA 18 MG/3ML ~~LOC~~ SOPN: 1.8000 mg | PEN_INJECTOR | Freq: Every day | SUBCUTANEOUS | 11 refills | Status: DC

## 2019-12-20 ENCOUNTER — Other Ambulatory Visit: Payer: Self-pay | Admitting: Family Medicine

## 2019-12-20 DIAGNOSIS — E785 Hyperlipidemia, unspecified: Secondary | ICD-10-CM

## 2019-12-26 NOTE — Progress Notes (Signed)
[place]   [phone] [phone]   Clinic Follow up Note   Patient Care Team: Lind Covert, MD as PCP - General (Family Medicine) Gevena Cotton, MD (Ophthalmology) Rana Snare, MD (Urology) Dr. Edwin Cap (Dental General Practice) Fanny Skates, MD as Consulting Physician (General Surgery) Holley Bouche, NP (Inactive) as Nurse Practitioner (Nurse Practitioner) Truitt Merle, MD as Consulting Physician (Hematology) Gardiner Barefoot, DPM (Podiatry) Pleasant, Eppie Gibson, RN as [place] [facility] Chambliss, Jeb Levering, MD (Family Medicine)  Date of Service:  12/31/2019  CHIEF COMPLAINT: F/u of right breast cancer  SUMMARY OF ONCOLOGIC HISTORY: Oncology History Overview Note    Breast cancer of lower-outer quadrant of right female breast   Staging form: Breast, AJCC 7th Edition     Clinical stage from 02/19/2015: Stage IA (T1b, N0, M0) - Unsigned       Staging comments: Staged at breast conference on 3.23.16      Pathologic stage from 03/24/2015: Stage IA (T1c, N0, cM0) - Signed by Enid Cutter, MD on 03/31/2015       Staging comments: Staged on final lumpectomy specimen by Dr. Donato Heinz.    Breast cancer of lower-outer quadrant of right female breast ([place])  01/23/2015 Mammogram   Possible mass in right breast warrants further evaluation.  No findings in the left breast suspicious for malignancy   02/03/2015 Breast US   Right breast: 5 x 5 x 5 mm irregular hypoechoic mass with internal color vascularity at 6 o'clock position, 4 cm from the nipple   02/10/2015 Initial Biopsy   Right needle core bx LOQ: Invasive ductal carcinoma, grade 1, ER+ (100%), PR+ (60%), HER2/neu negative (ratio 1.13), Ki67 3%; DCIS.     02/12/2015 Breast MRI   Biopsy-proven malignancy in the central right breast at posterior depth. No MR findings to suggest multicentric or contralateral malignancy.   02/12/2015 Clinical Stage   Stage IA: T1b N0   03/21/2015 Definitive Surgery   Right breast lumpectomy / SLNB Dalbert Batman): Invasive ductal carcinoma, grade 1, 1.1 cm, ER+ (100%), PR+ (73%), HER-2 negative (ratio 1.3), Ki67 6%, negative margins / no lymphovascular invasion; DCIS.  One lymph node negative for tumor (0/1).     03/21/2015 Oncotype testing   RS 7, low risk. (ROR 5% for 10-year recurrence with Tamoxifen alone).    03/21/2015 Pathologic Stage   Stage IA: pT1c pN0   05/07/2015 - 06/23/2015 Radiation Therapy   Adjuvant RT completed Pablo Ledger): Right breast 45 Gy over 25 fractions.  Right breast boost 16 Gy over 8 fractions. Total dose: 60 Gy.   06/02/2015 - 05/2020 Anti-estrogen oral therapy   Aromasin 25 mg once daily Burr Medico).  Planned duration of therapy: 5 years, completed in 05/2020.    08/15/2015 Survivorship   Survivorship visit completed and copy of survivorship care plan provided to patient.   01/31/2018 Mammogram   IMPRESSION: No mammographic evidence for malignancy.      CURRENT THERAPY:  Aromasin 32m daily, started on 06/02/2015. Plan to complete in 05/2020  INTERVAL HISTORY:  Kirsten MARKOVis here for a follow up of right breast cancer. She was last seen by me 6 months ago. She presents to the clinic alone. She notes she is doing well. She is interested in [place].  She notes she tolerating Exemestane well. She notes 2 weeks ago she started having hot flashes which lasts 20-25 minutes intermittently. Overall this is tolerable. Her baseline arthritis is stable.    REVIEW  OF SYSTEMS:   Constitutional: Denies fevers, chills or abnormal weight loss (+) Intermittent hot flashes  Eyes: Denies blurriness of vision Ears, nose, mouth, throat, and face: Denies mucositis or sore throat Respiratory: Denies cough, dyspnea or wheezes Cardiovascular: Denies palpitation, chest discomfort or lower extremity swelling Gastrointestinal:  Denies nausea, heartburn or change in bowel habits Skin: Denies abnormal skin rashes MSK:  (+) Stable arthritis  Lymphatics: Denies new lymphadenopathy or easy bruising Neurological:Denies numbness, tingling or new weaknesses Behavioral/Psych: Mood is stable, no new changes  All other systems were reviewed with the patient and are negative.  MEDICAL HISTORY:  Past Medical History:  Diagnosis Date  . Anemia   . Arthritis   . Asthma   . Blood transfusion without reported diagnosis    Patients believes 2015 when she overdosed on "medications"   . Breast cancer of lower-outer quadrant of right female breast ([place]) 02/13/2015  . Cataract   . CHF, acute ([place]) 05/03/2012  . Depression   . Diabetes mellitus   . Eczema   . Fatty liver    Fatty infiltration of liver noted on 03/2012 CT scan  . Fibromyalgia   . Glaucoma   . Heart disease   . History of kidney stones    w/ hx of hydronephrosis - followed by [facility]  . HIV nonspecific serology    2006: indeterminate HIV blood test, seen by ID, felt secondary to cross reacting antibodies with no further workup felt necessary at that time   . Hypertension   . Obesity   . Personal history of radiation therapy 2016   right  . Radiation 05/07/15-06/23/15   Right Breast    SURGICAL HISTORY: Past Surgical History:  Procedure Laterality Date  . BREAST BIOPSY Left 02/10/2015   malignant   . BREAST LUMPECTOMY Right 03/21/2015  . CHOLECYSTECTOMY  2003  . CYSTOSCOPY W/ LITHOLAPAXY / EHL    . JOINT REPLACEMENT     bilateral knee replacement  . LEFT AND RIGHT HEART CATHETERIZATION WITH CORONARY ANGIOGRAM N/A 05/02/2012   Procedure: LEFT AND RIGHT HEART CATHETERIZATION WITH CORONARY ANGIOGRAM;  Surgeon: Burnell Blanks, MD;  Location[facility];  Service: Cardiovascular;  Laterality: N/A;  . RADIOACTIVE SEED GUIDED PARTIAL MASTECTOMY WITH AXILLARY SENTINEL LYMPH NODE BIOPSY Right 03/21/2015   Procedure: RIGHT  PARTIAL MASTECTOMY WITH RADIOACTIVE SEED LOCALIZATION RIGHT  AXILLARY SENTINEL LYMPH NODE BIOPSY;  Surgeon: Fanny Skates, MD;  Location: [place];  Service: General;  Laterality: Right;  . REPLACEMENT TOTAL KNEE BILATERAL  2005 &2006  . VASCULAR SURGERY Right 03/15/2013   Ultrasound guided sclerotherapy    I have reviewed the social history and family history with the patient and they are unchanged from previous note.  ALLERGIES:  has No Known Allergies.  MEDICATIONS:  Current Outpatient Medications  Medication Sig Dispense Refill  . acetaminophen (TYLENOL 8 HOUR ARTHRITIS PAIN) 650 MG CR tablet Take 650 mg by mouth every 8 (eight) hours as needed for pain.    Marland Kitchen allopurinol (ZYLOPRIM) 100 MG tablet TAKE 1 TABLET BY MOUTH  DAILY 90 tablet 3  . aspirin 81 MG tablet Take 81 mg by mouth daily.    Marland Kitchen atorvastatin (LIPITOR) 40 MG tablet TAKE 1 TABLET BY MOUTH  DAILY 90 tablet 3  . Blood Glucose Monitoring Suppl (ONETOUCH VERIO FLEX SYSTEM) w/Device KIT USE AS DIRECTED 1 kit 0  . colchicine 0.6 MG tablet TAKE 1 TABLET BY MOUTH  DAILY 90 tablet 3  . dorzolamide-timolol (  COSOPT) 22.3-6.8 MG/ML ophthalmic solution Place 1 drop into both eyes 2 (two) times daily.  0  . exemestane (AROMASIN) 25 MG tablet Take 1 tablet (25 mg total) by mouth daily after breakfast. Take 1 tab daily 90 tablet 3  . fluticasone (FLOVENT HFA) 110 MCG/ACT inhaler USE 2 INHALATIONS BY MOUTH  TWICE DAILY 12 g 11  . furosemide (LASIX) 20 MG tablet Take 1 tablet (20 mg total) by mouth every other day. 90 tablet 2  . gabapentin (NEURONTIN) 100 MG capsule TAKE 3 CAPSULES BY MOUTH 3  TIMES DAILY 540 capsule 6  . glucose blood test strip 1 each by Other route every morning. Use as instructed 100 each 3  . Insulin Pen Needle (PEN NEEDLES 5/16") 30G X 8 MM MISC Use to inject insulin as directed.  Dx code:E11.69 32 each 12  . JARDIANCE 10 MG TABS tablet TAKE 1 TABLET BY MOUTH DAILY 90 tablet 1  . Lancet Devices (ONETOUCH DELICA PLUS LANCING) MISC USE ONCE DAILY 1 each 0  . latanoprost (XALATAN) 0.005 % ophthalmic solution Place 1  drop into both eyes at bedtime.   0  . liraglutide (VICTOZA) 18 MG/3ML SOPN Inject 0.3 mLs (1.8 mg total) into the skin daily. 6 mL 11  . losartan (COZAAR) 100 MG tablet Take 1 tablet (100 mg total) by mouth at bedtime. 90 tablet 3  . metFORMIN (GLUCOPHAGE-XR) 500 MG 24 hr tablet TAKE 1 TABLET BY MOUTH  TWICE DAILY 180 tablet 3  . metoprolol succinate (TOPROL-XL) 25 MG 24 hr tablet TAKE 1 TABLET BY MOUTH  DAILY 90 tablet 0  . OneTouch Delica Lancets 58K MISC 1 Container by Does not apply route daily as needed. 100 each prn  . spironolactone (ALDACTONE) 25 MG tablet TAKE 1 TABLET BY MOUTH  DAILY 90 tablet 3   No current facility-administered medications for this visit.    PHYSICAL EXAMINATION: ECOG PERFORMANCE STATUS: 1 - Symptomatic but completely ambulatory  Vitals:   12/31/19 1028  BP: (!) 143/89  Pulse: 83  Resp: 20  Temp: 98.1 F (36.7 C)  SpO2: 100%   Filed Weights   12/31/19 1028  Weight: 221 lb 14.4 oz (100.7 kg)    GENERAL:alert, no distress and comfortable SKIN: skin color, texture, turgor are normal, no rashes or significant lesions (+) Keloid of neck EYES: normal, Conjunctiva are pink and non-injected, sclera clear  NECK: supple, thyroid normal size, non-tender, without nodularity LYMPH:  no palpable lymphadenopathy in the cervical, axillary  LUNGS: clear to auscultation and percussion with normal breathing effort HEART: regular rate & rhythm and no murmurs and no lower extremity edema ABDOMEN:abdomen soft, non-tender and normal bowel sounds Musculoskeletal:no cyanosis of digits and no clubbing  NEURO: alert & oriented x 3 with fluent speech, no focal motor/sensory deficits BREAST: S/p right lumpectomy: Surgical incision healed well. No palpable mass, nodules or adenopathy bilaterally. Breast exam benign.   LABORATORY DATA:  I have reviewed the data as listed CBC Latest Ref Rng & Units 12/31/2019 06/07/2019 09/23/2018  WBC 4.0 - 10.5 K/uL 9.8 9.9 8.4  Hemoglobin  12.0 - 15.0 g/dL 12.4 11.9(L) 10.8(L)  Hematocrit 36.0 - 46.0 % 39.7 38.7 37.4  Platelets 150 - 400 K/uL 177 150 192     CMP Latest Ref Rng & Units 12/31/2019 06/07/2019 04/06/2019  Glucose 70 - 99 mg/dL 171(H) 207(H) 279(H)  BUN 8 - 23 mg/dL 32(H) 37(H) 32(H)  Creatinine 0.44 - 1.00 mg/dL 1.45(H) 1.47(H) 1.29(H)  Sodium 135 -  145 mmol/L 139 138 140  Potassium 3.5 - 5.1 mmol/L 4.7 4.2 4.4  Chloride 98 - 111 mmol/L 105 107 101  CO2 22 - 32 mmol/L 22 17(L) 21  Calcium 8.9 - 10.3 mg/dL 9.5 9.2 9.6  Total Protein 6.5 - 8.1 g/dL 7.8 7.8 -  Total Bilirubin 0.3 - 1.2 mg/dL 0.5 0.4 -  Alkaline Phos 38 - 126 U/L 120 164(H) -  AST 15 - 41 U/L 17 20 -  ALT 0 - 44 U/L 16 17 -      RADIOGRAPHIC STUDIES: I have personally reviewed the radiological images as listed and agreed with the findings in the report. No results found.   ASSESSMENT & PLAN:  [name] is a 69 y.o. female with    1. Breast cancer of lower-outer quadrant of right breast, invasive ductal carcinoma, pT1c N0 M0, stage IA, ER positive PR positive HER-2 negative, Ki 67 6%, and DCIS -She was diagnosed in 01/2015. She is s/p right breast lumpectomy with SLNB and adjuvant Radiation.  -Her Oncotype showed low risk, no need adjuvant chemotherapy -She started antiestrogen therapy with Exemestane in 05/2015. She is tolerating well so far, we'll continue, plan for total 5 years until 05/2020.  -She is clinically doing well. Lab reviewed, her CBC and CMP are within normal limits except BG 171, BUN 32, Cr 1.45. Her physical exam and her 01/2019 mammogram were unremarkable. There is no clinical concern for recurrence. -Continue Surveillance. Next mammogram in 01/2020  -Continue Exemestane until 05/2020.  -F/u in 1 year.    2. Bone health -I have discussed the side effect of osteopenia and osteoporosis from Aromasin. She will complete in 05/2020 -Her bone density scan in 08/2015 was normal. DEXA from 01/31/18 normal with T Score of -0.9. -I  encouraged her to continue to take Vitamin D or multivitamin -Next DEXA in 01/2020  3. Hypertension, diabetes, arthritis, CHF -She will continue follow-up with her primary care physician -She was bradycardic in 2020, I encouraged her to see PCP about medication adjustment.  -She has stable mild back pain from arthritis. She does not currently have a orthopedic surgeon to follow up.  -Her A1c slightly improved to 9.2 in 10/2019.    4. CKD stage III  -previously advised to avoid nephrotoxin  -She follows up with nephrology, kidney function overall stable.   5. Unstable gait  -she has had this issue for several years, previous was seen by neurologist Dr. Rexene Alberts  -Previous brain MRI was negative -Follow-up with primary care physician, she has completed PT.  -Her balance has improved. I encouraged her to continue PT exercises.    6. Anemia -She has mild chronic normocytic anemia, Probably secondary to her CKD -Her prior study in 2014 showed elevated ferritin, normal iron -Overall stable, we'll continue monitoring. She is due to colonoscopy in 2022 -resolved today (12/31/19)  7.Bradycardia -She was found to be bradycardia on the vital sign, she was asymmetric. -Prior EKG during her visit, which showed sinus rhythm 86, with frequent premature ventricular complexes. No significant ST change on the EKG.   Plan -Continue Exemestane, plan to complete in 05/2020 -Lab and f/u in 1 year  -Mammogram and DEXA in 01/2020    No problem-specific Assessment & Plan notes found for this encounter.   Orders Placed This Encounter  Procedures  . MM DIAG BREAST TOMO BILATERAL    [facility] - epic order Annual - pf 02/06/2019 bcg - hx of rt breast cancer lumpectomy -  no needs tomo - np & pt     Standing Status:   Future    Standing Expiration Date:   12/30/2020    Order Specific Question:   Reason for Exam (SYMPTOM  OR DIAGNOSIS REQUIRED)    Answer:   screening    Order Specific Question:    Preferred imaging location?    Answer:   [facility]  . DG Bone Density    [facility] - epic order Pf : 01/31/2018 bcg  Wt - 213 - no needs Pt aware to stop taking calcs or MV 48 hrs prior np & pt     Standing Status:   Future    Standing Expiration Date:   12/30/2020    Order Specific Question:   Reason for Exam (SYMPTOM  OR DIAGNOSIS REQUIRED)    Answer:   screening    Order Specific Question:   Preferred imaging location?    Answer:   [facility]   All questions were answered. The patient knows to call the clinic with any problems, questions or concerns. No barriers to learning was detected. The total time spent in the appointment was 25 minutes.     Truitt Merle, MD 12/31/2019   I, Joslyn Devon, am acting as scribe for Truitt Merle, MD.   I have reviewed the above documentation for accuracy and completeness, and I agree with the above.

## 2019-12-31 ENCOUNTER — Encounter: Payer: Self-pay | Admitting: Hematology

## 2019-12-31 ENCOUNTER — Other Ambulatory Visit: Payer: Self-pay

## 2019-12-31 ENCOUNTER — Inpatient Hospital Stay: Payer: Medicare Other | Attending: Hematology

## 2019-12-31 ENCOUNTER — Inpatient Hospital Stay (HOSPITAL_BASED_OUTPATIENT_CLINIC_OR_DEPARTMENT_OTHER): Payer: Medicare Other | Admitting: Hematology

## 2019-12-31 VITALS — BP 143/89 | HR 83 | Temp 98.1°F | Resp 20 | Ht 63.35 in | Wt 221.9 lb

## 2019-12-31 DIAGNOSIS — Z853 Personal history of malignant neoplasm of breast: Secondary | ICD-10-CM | POA: Insufficient documentation

## 2019-12-31 DIAGNOSIS — C50511 Malignant neoplasm of lower-outer quadrant of right female breast: Secondary | ICD-10-CM

## 2019-12-31 DIAGNOSIS — Z794 Long term (current) use of insulin: Secondary | ICD-10-CM | POA: Insufficient documentation

## 2019-12-31 DIAGNOSIS — I10 Essential (primary) hypertension: Secondary | ICD-10-CM

## 2019-12-31 DIAGNOSIS — E1122 Type 2 diabetes mellitus with diabetic chronic kidney disease: Secondary | ICD-10-CM | POA: Diagnosis not present

## 2019-12-31 DIAGNOSIS — I5042 Chronic combined systolic (congestive) and diastolic (congestive) heart failure: Secondary | ICD-10-CM

## 2019-12-31 DIAGNOSIS — R232 Flushing: Secondary | ICD-10-CM | POA: Diagnosis not present

## 2019-12-31 DIAGNOSIS — E2839 Other primary ovarian failure: Secondary | ICD-10-CM | POA: Diagnosis not present

## 2019-12-31 DIAGNOSIS — N183 Chronic kidney disease, stage 3 unspecified: Secondary | ICD-10-CM | POA: Diagnosis not present

## 2019-12-31 DIAGNOSIS — Z170 Estrogen receptor positive status [ER+]: Secondary | ICD-10-CM | POA: Diagnosis not present

## 2019-12-31 DIAGNOSIS — Z79899 Other long term (current) drug therapy: Secondary | ICD-10-CM | POA: Insufficient documentation

## 2019-12-31 DIAGNOSIS — R001 Bradycardia, unspecified: Secondary | ICD-10-CM | POA: Insufficient documentation

## 2019-12-31 DIAGNOSIS — I130 Hypertensive heart and chronic kidney disease with heart failure and stage 1 through stage 4 chronic kidney disease, or unspecified chronic kidney disease: Secondary | ICD-10-CM | POA: Insufficient documentation

## 2019-12-31 LAB — CBC WITH DIFFERENTIAL/PLATELET
Abs Immature Granulocytes: 0.12 10*3/uL — ABNORMAL HIGH (ref 0.00–0.07)
Basophils Absolute: 0.1 10*3/uL (ref 0.0–0.1)
Basophils Relative: 1 %
Eosinophils Absolute: 0.3 10*3/uL (ref 0.0–0.5)
Eosinophils Relative: 3 %
HCT: 39.7 % (ref 36.0–46.0)
Hemoglobin: 12.4 g/dL (ref 12.0–15.0)
Immature Granulocytes: 1 %
Lymphocytes Relative: 33 %
Lymphs Abs: 3.3 10*3/uL (ref 0.7–4.0)
MCH: 30.7 pg (ref 26.0–34.0)
MCHC: 31.2 g/dL (ref 30.0–36.0)
MCV: 98.3 fL (ref 80.0–100.0)
Monocytes Absolute: 0.9 10*3/uL (ref 0.1–1.0)
Monocytes Relative: 10 %
Neutro Abs: 5.1 10*3/uL (ref 1.7–7.7)
Neutrophils Relative %: 52 %
Platelets: 177 10*3/uL (ref 150–400)
RBC: 4.04 MIL/uL (ref 3.87–5.11)
RDW: 13.6 % (ref 11.5–15.5)
WBC: 9.8 10*3/uL (ref 4.0–10.5)
nRBC: 0 % (ref 0.0–0.2)

## 2019-12-31 LAB — CMP (CANCER CENTER ONLY)
ALT: 16 U/L (ref 0–44)
AST: 17 U/L (ref 15–41)
Albumin: 4.2 g/dL (ref 3.5–5.0)
Alkaline Phosphatase: 120 U/L (ref 38–126)
Anion gap: 12 (ref 5–15)
BUN: 32 mg/dL — ABNORMAL HIGH (ref 8–23)
CO2: 22 mmol/L (ref 22–32)
Calcium: 9.5 mg/dL (ref 8.9–10.3)
Chloride: 105 mmol/L (ref 98–111)
Creatinine: 1.45 mg/dL — ABNORMAL HIGH (ref 0.44–1.00)
GFR, Est AFR Am: 42 mL/min — ABNORMAL LOW (ref >60)
GFR, Estimated: 36 mL/min — ABNORMAL LOW (ref >60)
Glucose, Bld: 171 mg/dL — ABNORMAL HIGH (ref 70–99)
Potassium: 4.7 mmol/L (ref 3.5–5.1)
Sodium: 139 mmol/L (ref 135–145)
Total Bilirubin: 0.5 mg/dL (ref 0.3–1.2)
Total Protein: 7.8 g/dL (ref 6.5–8.1)

## 2020-01-01 ENCOUNTER — Telehealth: Payer: Self-pay | Admitting: Hematology

## 2020-01-01 NOTE — Telephone Encounter (Signed)
Scheduled appt per 2/1 los.  Sent a message to HIM pool to get a calendar mailed out. 

## 2020-01-09 ENCOUNTER — Ambulatory Visit (INDEPENDENT_AMBULATORY_CARE_PROVIDER_SITE_OTHER): Payer: Medicare Other | Admitting: Podiatry

## 2020-01-09 ENCOUNTER — Encounter: Payer: Self-pay | Admitting: Podiatry

## 2020-01-09 ENCOUNTER — Other Ambulatory Visit: Payer: Self-pay

## 2020-01-09 DIAGNOSIS — E1140 Type 2 diabetes mellitus with diabetic neuropathy, unspecified: Secondary | ICD-10-CM | POA: Diagnosis not present

## 2020-01-09 DIAGNOSIS — E11610 Type 2 diabetes mellitus with diabetic neuropathic arthropathy: Secondary | ICD-10-CM

## 2020-01-09 DIAGNOSIS — B351 Tinea unguium: Secondary | ICD-10-CM | POA: Diagnosis not present

## 2020-01-09 DIAGNOSIS — M2010 Hallux valgus (acquired), unspecified foot: Secondary | ICD-10-CM

## 2020-01-09 NOTE — Progress Notes (Signed)
Patient ID: [name], female   DOB: 1951-03-13, 69 y.o.   MRN: [id number] Complaint:  Visit Type: Patient returns to the office for preventative foot care services.  Patient states nails are painful walking and wearing her shoes.  She is unable to self treat.  Patient is diabetic.   Podiatric Exam: Vascular: dorsalis pedis and posterior tibial pulses are palpable bilateral. Capillary return is immediate. Temperature gradient is WNL. Skin turgor WNL  Sensorium: Diminished Semmes Weinstein monofilament test. Normal tactile sensation bilaterally. Nail Exam: Pt has thick disfigured discolored nails second and third toenails left foot. Ulcer Exam: There is no evidence of ulcer or pre-ulcerative changes or infection. Orthopedic Exam: Muscle tone and strength are WNL. No limitations in general ROM. No crepitus or effusions noted. Foot type and digits show no abnormalities. . Asymptomatic HAV B/L.  Pes planus noted. Skin: No Porokeratosis. No infection or ulcers  Diagnosis:  Onychomycosis  Diabetes  HAV  B/L  Pes planus  B/L  Treatment & Plan Procedures and Treatment: Debridement and grinding of long painful nails both feet.Marland Kitchen Marland KitchenRTC 3 months.    Return Visit-Office Procedure: Patient instructed to return to the office for a follow up visit for scheduled preventive foot care services.   Gardiner Barefoot DPM

## 2020-01-10 ENCOUNTER — Other Ambulatory Visit: Payer: Self-pay | Admitting: Family Medicine

## 2020-01-25 ENCOUNTER — Ambulatory Visit: Payer: Medicare Other | Attending: Internal Medicine

## 2020-01-25 DIAGNOSIS — Z23 Encounter for immunization: Secondary | ICD-10-CM | POA: Insufficient documentation

## 2020-01-25 NOTE — Progress Notes (Signed)
   Covid-19 Vaccination Clinic  Name:  [name]    MRN: [id number] DOB: 23-Jul-1951  01/25/2020  [name] was observed post Covid-19 immunization for 15 minutes without incidence. She was provided with Vaccine Information Sheet and instruction to access the V-Safe system.   [name] was instructed to call 911 with any severe reactions post vaccine: Marland Kitchen Difficulty breathing  . Swelling of your face and throat  . A fast heartbeat  . A bad rash all over your body  . Dizziness and weakness    Immunizations Administered    Name Date Dose VIS Date Route   Pfizer COVID-19 Vaccine 01/25/2020 10:05 AM 0.3 mL 11/09/2019 Intramuscular   Manufacturer: [place]   Lot: [id number]   [place]: [id number]

## 2020-02-07 ENCOUNTER — Other Ambulatory Visit: Payer: Self-pay

## 2020-02-07 ENCOUNTER — Ambulatory Visit
Admission: RE | Admit: 2020-02-07 | Discharge: 2020-02-07 | Disposition: A | Discharge status: Home / Self Care | Payer: Medicare Other | Source: Ambulatory Visit | Attending: Hematology | Admitting: Hematology

## 2020-02-07 DIAGNOSIS — R928 Other abnormal and inconclusive findings on diagnostic imaging of breast: Secondary | ICD-10-CM | POA: Diagnosis not present

## 2020-02-07 DIAGNOSIS — C50511 Malignant neoplasm of lower-outer quadrant of right female breast: Secondary | ICD-10-CM

## 2020-02-07 DIAGNOSIS — Z170 Estrogen receptor positive status [ER+]: Secondary | ICD-10-CM

## 2020-02-13 ENCOUNTER — Other Ambulatory Visit: Payer: Self-pay

## 2020-02-13 ENCOUNTER — Ambulatory Visit (INDEPENDENT_AMBULATORY_CARE_PROVIDER_SITE_OTHER): Payer: Medicare Other | Admitting: Family Medicine

## 2020-02-13 VITALS — BP 126/80 | HR 94 | Wt 225.2 lb

## 2020-02-13 DIAGNOSIS — E7800 Pure hypercholesterolemia, unspecified: Secondary | ICD-10-CM

## 2020-02-13 DIAGNOSIS — I10 Essential (primary) hypertension: Secondary | ICD-10-CM | POA: Diagnosis not present

## 2020-02-13 DIAGNOSIS — E1169 Type 2 diabetes mellitus with other specified complication: Secondary | ICD-10-CM

## 2020-02-13 DIAGNOSIS — M1000 Idiopathic gout, unspecified site: Secondary | ICD-10-CM | POA: Diagnosis not present

## 2020-02-13 DIAGNOSIS — M21969 Unspecified acquired deformity of unspecified lower leg: Secondary | ICD-10-CM | POA: Diagnosis not present

## 2020-02-13 LAB — POCT GLYCOSYLATED HEMOGLOBIN (HGB A1C): HbA1c, POC (controlled diabetic range): 8.1 % — AB (ref 0.0–7.0)

## 2020-02-13 NOTE — Patient Instructions (Signed)
Good to see you today!  Thanks for coming in.  Your diabetes is doing well - continue doing what you are doing  You can stop the Colchicine and the aspirin 81 mg  Come back in 3 months

## 2020-02-13 NOTE — Progress Notes (Signed)
    SUBJECTIVE:   CHIEF COMPLAINT / HPI:   HYPERTENSION Disease Monitoring: Blood pressure range-not checking Chest pain, palpitations- no       Medications: Compliance- brings all her meds Lightheadedness,Syncope- no   Edema- no  DIABETES Disease Monitoring: Blood Sugar ranges-not checking regularly  Medications: Compliance- good Hypoglycemic symptoms- no  HYPERLIPIDEMIA Medications: Compliance- daily atorvastatin Right upper quadrant pain- no  Muscle aches- no    PERTINENT  PMH / PSH: will be stopping lung cancer drug in July   OBJECTIVE:   BP 126/80   Pulse 94   Wt 225 lb 3.2 oz (102.2 kg)   SpO2 98%   BMI 39.45 kg/m   Heart - Regular rate and rhythm.  No murmurs, gallops or rubs.    Lungs:  Normal respiratory effort, chest expands symmetrically. Lungs are clear to auscultation, no crackles or wheezes. In good spirits  Able to walk around room without walker but uses for distances  ASSESSMENT/PLAN:   Essential hypertension Good control continue current medications   Hyperlipidemia Good control continue lipitor  Type 2 diabetes mellitus with diabetic foot deformity Improved control continue current dosages   Gout Stable.  Will stop daily colchicine continue allopurinol      Lind Covert, MD [place]

## 2020-02-13 NOTE — Assessment & Plan Note (Signed)
Stable.  Will stop daily colchicine continue allopurinol

## 2020-02-13 NOTE — Assessment & Plan Note (Signed)
Good control continue lipitor

## 2020-02-13 NOTE — Assessment & Plan Note (Signed)
Improved control continue current dosages

## 2020-02-13 NOTE — Assessment & Plan Note (Signed)
Good control continue current medications  

## 2020-02-19 ENCOUNTER — Ambulatory Visit: Payer: Medicare Other | Attending: Internal Medicine

## 2020-02-19 DIAGNOSIS — Z23 Encounter for immunization: Secondary | ICD-10-CM

## 2020-02-19 NOTE — Progress Notes (Signed)
   Covid-19 Vaccination Clinic  Name:  [name]    MRN: [id number] DOB: [birth year]  02/19/2020  [name] was observed post Covid-19 immunization for 15 minutes without incident. She was provided with Vaccine Information Sheet and instruction to access the V-Safe system.   [name] was instructed to call 911 with any severe reactions post vaccine: Marland Kitchen Difficulty breathing  . Swelling of face and throat  . A fast heartbeat  . A bad rash all over body  . Dizziness and weakness   Immunizations Administered    Name Date Dose VIS Date Route   Pfizer COVID-19 Vaccine 02/19/2020 12:05 PM 0.3 mL 11/09/2019 Intramuscular   Manufacturer: [place]   Lot: [id number]   [place]: [id number]

## 2020-02-21 ENCOUNTER — Other Ambulatory Visit: Payer: Self-pay | Admitting: *Deleted

## 2020-02-21 NOTE — Patient Outreach (Signed)
[place] [facility]) Care Management  [place]  12/07/3233   [name] [birth year] [phone]  RN Health Coach telephone call to patient.  Hipaa compliance verified. Per patient her fasting blood sugar is 227. Her A1C has decreased to 8.1 from 9.1. Per patient she is taking her medications as per ordered. Patient states she is monitoring her blood sugars daily.  Patient has been to podiatrist for foot care. She has received both COVID vaccines. Patient stated her appetite is good. She has decreased some of the sweet intake. Patient has not had any recent falls. Patient has agreed to further out reach calls.   Encounter Medications:  Outpatient Encounter Medications as of 02/21/2020  Medication Sig Note  . acetaminophen (TYLENOL 8 HOUR ARTHRITIS PAIN) 650 MG CR tablet Take 650 mg by mouth every 8 (eight) hours as needed for pain. 08/14/2019: Takes 2 tablets in the morning, 1 tablet at noon, and 2 tablets at nights  . allopurinol (ZYLOPRIM) 100 MG tablet TAKE 1 TABLET BY MOUTH  DAILY   . atorvastatin (LIPITOR) 40 MG tablet TAKE 1 TABLET BY MOUTH  DAILY   . Blood Glucose Monitoring Suppl (ONETOUCH VERIO FLEX SYSTEM) w/Device KIT USE AS DIRECTED   . dorzolamide-timolol (COSOPT) 22.3-6.8 MG/ML ophthalmic solution Place 1 drop into both eyes 2 (two) times daily.   Marland Kitchen exemestane (AROMASIN) 25 MG tablet Take 1 tablet (25 mg total) by mouth daily after breakfast. Take 1 tab daily   . fluticasone (FLOVENT HFA) 110 MCG/ACT inhaler USE 2 INHALATIONS BY MOUTH  TWICE DAILY   . furosemide (LASIX) 20 MG tablet Take 1 tablet (20 mg total) by mouth every other day.   . gabapentin (NEURONTIN) 100 MG capsule TAKE 3 CAPSULES BY MOUTH 3  TIMES DAILY   . glucose blood test strip 1 each by Other route every morning. Use as instructed   . Insulin Pen Needle (PEN NEEDLES 5/16") 30G X 8 MM MISC Use to inject insulin as directed.  Dx code:E11.69   . JARDIANCE 10 MG TABS tablet TAKE 1 TABLET BY MOUTH  DAILY   . Lancet Devices (ONETOUCH DELICA PLUS LANCING) MISC USE ONCE DAILY   . latanoprost (XALATAN) 0.005 % ophthalmic solution Place 1 drop into both eyes at bedtime.    . liraglutide (VICTOZA) 18 MG/3ML SOPN Inject 0.3 mLs (1.8 mg total) into the skin daily.   Marland Kitchen losartan (COZAAR) 100 MG tablet Take 1 tablet (100 mg total) by mouth at bedtime.   . metFORMIN (GLUCOPHAGE-XR) 500 MG 24 hr tablet TAKE 1 TABLET BY MOUTH  TWICE DAILY   . metoprolol succinate (TOPROL-XL) 25 MG 24 hr tablet TAKE 1 TABLET BY MOUTH  DAILY   . OneTouch Delica Lancets 62G MISC 1 Container by Does not apply route daily as needed.   Marland Kitchen spironolactone (ALDACTONE) 25 MG tablet TAKE 1 TABLET BY MOUTH  DAILY    No facility-administered encounter medications on file as of 02/21/2020.    Functional Status:  In your present state of health, do you have any difficulty performing the following activities: 11/27/2019 08/22/2019  Hearing? N N  Vision? N N  Difficulty concentrating or making decisions? N N  Comment - -  Walking or climbing stairs? Y Y  Comment - due to knee pain and ambulates with rollator walker  Dressing or bathing? N N  Doing errands, shopping? Y Y  Comment - Daughter does Science writer and eating ? - N  Using the  Toilet? - N  In the past six months, have you accidently leaked urine? - N  Do you have problems with loss of bowel control? - N  Managing your Medications? - N  Managing your Finances? - N  Housekeeping or managing your Housekeeping? - Y  Comment - daughter assists  Some recent data might be hidden    Fall/Depression Screening: Fall Risk  02/21/2020 02/13/2020 11/27/2019  Falls in the past year? 0 0 -  Number falls in past yr: 0 0 0  Injury with Fall? 0 0 1  Risk Factor Category  - - -  Risk for fall due to : Impaired balance/gait;Impaired mobility - History of fall(s);Impaired balance/gait;Impaired mobility;Impaired vision  Risk for fall due to: Comment - - -  Follow up Falls  evaluation completed;Falls prevention discussed - Falls evaluation completed;Falls prevention discussed  Comment - - -   PHQ 2/9 Scores 02/21/2020 02/13/2020 11/27/2019 11/13/2019 10/19/2019 08/22/2019 06/21/2019  PHQ - 2 Score 0 0 1 1 0 1 1  PHQ- 9 Score - - - - - - -   THN CM Care Plan Problem One     Most Recent Value  Care Plan Problem One  Knowledge Deficit in self management of Diabetes  Role Documenting the Problem One  [place] for Problem One  Active  THN Long Term Goal   Patient will see a decrease in A1C from 8.1 with next blood draw  Interventions for Problem One Long Term Goal  RN encouraged patient with A1C decreasing from .1 to 8.1.Marland Kitchen RN discussed monitoring blood sugars as per ordered. RN discussed adhering to diet. RN will follow up for further outreach calls  [facility] CM Short Term Goal #1   Patient will be able to make healthier choices with snacks within the next 30 days  THN CM Short Term Goal #1 Start Date  02/21/20  Interventions for Short Term Goal #1  Patient needs encouragement to eat healthier and decrease the sweets in her diet. RN will continue to follow up for compliance.  THN CM Short Term Goal #2   Patient will be able to verbalize symptoms of high and low blood sugars and the action plan within the next 30 days  THN CM Short Term Goal #2 Met Date  02/21/20  [facility] CM Short Term Goal #3  Patient will be able to verbalize making health maintenance appointments. RN sent educational material on Diabetes taking care of yourself throughout the year. RN sent Diabetes taking care of yourself day to day. RN will follow up with further outreach for compliance.  Interventions for Short Tern Goal #3  RN discussed health maintenance. Patient has received her COVID vaccines. RN encouraged patient to keep up with eye exam, flu shots. RN will follow up with further discussion  THN CM Short Term Goal #4  Patient will verbalize understanding healthy food choices from dining out  within the next 30 days  THN CM Short Term Goal #4 Met Date  02/21/20  [facility] CM Short Term Goal #5   Patient will check blood sugaurs and document within the next 30 days  Interventions for Short Term Goal #5  RN reiterated patient to check her blood sugars and document. RN has provide the calendar book to document them in. RN will follow up for compliance      Assessment A1C 8.1 from 9.1 FBS 227 Patient has received both COVID vaccines Foot care done 01/09/2020 Per patient she is monitoring  blood sugars daily Plan:  RN encouraged patient to continue checking blood sugar daily and documenting Patient A1C is 8.1 goal is 7.0 RN Health Coach encouraged patient to do Silver sneakers on line RN reiterated healthy eating decreasing sweets RN will follow up within the month of June  Rasheka Denard [facility] [phone]

## 2020-03-17 ENCOUNTER — Other Ambulatory Visit: Payer: Self-pay | Admitting: Hematology

## 2020-03-17 DIAGNOSIS — C50511 Malignant neoplasm of lower-outer quadrant of right female breast: Secondary | ICD-10-CM

## 2020-03-18 ENCOUNTER — Ambulatory Visit
Admission: RE | Admit: 2020-03-18 | Discharge: 2020-03-18 | Disposition: A | Discharge status: Home / Self Care | Payer: Medicare Other | Source: Ambulatory Visit | Attending: Hematology | Admitting: Hematology

## 2020-03-18 ENCOUNTER — Other Ambulatory Visit: Payer: Self-pay

## 2020-03-18 DIAGNOSIS — E2839 Other primary ovarian failure: Secondary | ICD-10-CM

## 2020-03-18 DIAGNOSIS — Z780 Asymptomatic menopausal state: Secondary | ICD-10-CM | POA: Diagnosis not present

## 2020-03-18 DIAGNOSIS — M85832 Other specified disorders of bone density and structure, left forearm: Secondary | ICD-10-CM | POA: Diagnosis not present

## 2020-03-21 ENCOUNTER — Telehealth: Payer: Self-pay

## 2020-03-21 NOTE — Telephone Encounter (Signed)
-----   Message from Truitt Merle, MD sent at 03/20/2020  9:52 AM EDT ----- Please let pt know her DEXA result. Last DEXA was normal, she now developed mild osteopenia, no high risk for fracture. Please encourage her to continue oral calcium and VitD supplement, and increase weight bearing exercise such as walking, thanks   [employer]  03/20/2020

## 2020-03-21 NOTE — Telephone Encounter (Signed)
I spoke with [name] and reviewed her Dexa results.  I told her [name] wants her to take calcium and vit D supplements and to increase weight bearing exercise.  She verbalized understanding.

## 2020-04-09 ENCOUNTER — Encounter: Payer: Self-pay | Admitting: Podiatry

## 2020-04-09 ENCOUNTER — Ambulatory Visit (INDEPENDENT_AMBULATORY_CARE_PROVIDER_SITE_OTHER): Payer: Medicare Other | Admitting: Podiatry

## 2020-04-09 ENCOUNTER — Other Ambulatory Visit: Payer: Self-pay

## 2020-04-09 VITALS — Temp 97.4°F

## 2020-04-09 DIAGNOSIS — B353 Tinea pedis: Secondary | ICD-10-CM | POA: Diagnosis not present

## 2020-04-09 DIAGNOSIS — E0821 Diabetes mellitus due to underlying condition with diabetic nephropathy: Secondary | ICD-10-CM

## 2020-04-09 DIAGNOSIS — E1140 Type 2 diabetes mellitus with diabetic neuropathy, unspecified: Secondary | ICD-10-CM

## 2020-04-09 DIAGNOSIS — B351 Tinea unguium: Secondary | ICD-10-CM | POA: Diagnosis not present

## 2020-04-09 DIAGNOSIS — M2010 Hallux valgus (acquired), unspecified foot: Secondary | ICD-10-CM

## 2020-04-09 MED ORDER — LOTRIMIN AF 2 % EX AERO: INHALATION_SPRAY | CUTANEOUS | 1 refills | Status: DC

## 2020-04-09 NOTE — Progress Notes (Signed)
This patient returns to my office for at risk foot care.  This patient requires this care by a professional since this patient will be at risk due to having diabetes.   This patient is unable to cut nails herself since the patient cannot reach her nails.These nails are painful walking and wearing shoes.  This patient presents for at risk foot care today.  General Appearance  Alert, conversant and in no acute stress.  Vascular  Dorsalis pedis and posterior tibial  pulses are palpable  bilaterally.  Capillary return is within normal limits  bilaterally. Temperature is within normal limits  bilaterally.  Neurologic  Senn-Weinstein monofilament wire test within normal limits  bilaterally. Muscle power within normal limits bilaterally.  Nails Thick disfigured discolored nails with subungual debris  from hallux to fifth toes bilaterally. No evidence of bacterial infection or drainage bilaterally.  Orthopedic  No limitations of motion  feet .  No crepitus or effusions noted.  No bony pathology or digital deformities noted. Asymptomatic  HAV  B/L.  Pes planus  B/L.  Skin  normotropic skin with no porokeratosis noted bilaterally.  No signs of infections or ulcers noted.   White mascerates skin interdigitally digits  Right foot.  Onychomycosis  Pain in right toes  Pain in left toes  Tinea pedis  Right foot.  Prescribe lotrimin-af  2 % aerosol spray.  Consent was obtained for treatment procedures.   Mechanical debridement of nails 1-5  bilaterally performed with a nail nipper.  Filed with dremel without incident.    Return office visit  3 months                    Told patient to return for periodic foot care and evaluation due to potential at risk complications.   Gardiner Barefoot DPM

## 2020-05-06 ENCOUNTER — Other Ambulatory Visit: Payer: Self-pay

## 2020-05-06 ENCOUNTER — Encounter: Payer: Self-pay | Admitting: Family Medicine

## 2020-05-06 ENCOUNTER — Ambulatory Visit (INDEPENDENT_AMBULATORY_CARE_PROVIDER_SITE_OTHER): Payer: Medicare Other | Admitting: Family Medicine

## 2020-05-06 VITALS — BP 118/72 | HR 84 | Ht 63.0 in | Wt 221.4 lb

## 2020-05-06 DIAGNOSIS — E0821 Diabetes mellitus due to underlying condition with diabetic nephropathy: Secondary | ICD-10-CM

## 2020-05-06 DIAGNOSIS — M21969 Unspecified acquired deformity of unspecified lower leg: Secondary | ICD-10-CM

## 2020-05-06 DIAGNOSIS — E1169 Type 2 diabetes mellitus with other specified complication: Secondary | ICD-10-CM | POA: Diagnosis not present

## 2020-05-06 DIAGNOSIS — I10 Essential (primary) hypertension: Secondary | ICD-10-CM | POA: Diagnosis not present

## 2020-05-06 DIAGNOSIS — E7800 Pure hypercholesterolemia, unspecified: Secondary | ICD-10-CM | POA: Diagnosis not present

## 2020-05-06 LAB — POCT GLYCOSYLATED HEMOGLOBIN (HGB A1C): HbA1c, POC (controlled diabetic range): 8.3 % — AB (ref 0.0–7.0)

## 2020-05-06 NOTE — Patient Instructions (Addendum)
Good to see you today!  Thanks for coming in.  Get calcium with vitamin D - around 1,000 mg a day - take two 500 mg tabs a day   Start back to the gym with exercise for the bones and diabetes   Come back in 3 months for a A1c check

## 2020-05-06 NOTE — Assessment & Plan Note (Signed)
Symptoms are stable.  On SGLT2 and ARB.  Will check labs next visit

## 2020-05-06 NOTE — Assessment & Plan Note (Signed)
Stable; continue statin

## 2020-05-06 NOTE — Assessment & Plan Note (Signed)
BP Readings from Last 3 Encounters:  05/06/20 118/72  02/13/20 126/80  12/31/19 (!) 143/89   At goal

## 2020-05-06 NOTE — Assessment & Plan Note (Signed)
A1c stable.  She wants to work on diet and exercise.  On SGLT2 and GLP and metformin

## 2020-05-06 NOTE — Assessment & Plan Note (Signed)
>>  ASSESSMENT AND PLAN FOR NEPHROPATHY, DIABETIC (HCC) WRITTEN ON 05/06/2020  2:41 PM BY Colin Norment L, MD  Symptoms are stable.  On SGLT2 and ARB.  Will check labs next visit

## 2020-05-06 NOTE — Progress Notes (Signed)
    SUBJECTIVE:   CHIEF COMPLAINT / HPI:   HYPERTENSION  Disease Monitoring  Blood pressure range-not checking Chest pain- no      Dyspnea- no Medications  Compliance- brings all meds Lightheadedness- no   Edema- no      DIABETES  Disease Monitoring  Blood Sugar ranges-fastings are in upper 100s lower 200s Polyuria- no New Visual problems- no  Medications  Compliance- knows and brings her meds Hypoglycemic symptoms- no      HYPERLIPIDEMIA  Disease Monitoring  See symptoms for Hypertension  Medications  Compliance- good RUQ pain- no  Muscle aches- no    ROS See HPI above   PMH Smoking Status noted    PERTINENT  PMH / PSH: bilateral knee replacement  OBJECTIVE:   BP 118/72   Pulse 84   Ht 5\' 3"  (1.6 m)   Wt 221 lb 6.4 oz (100.4 kg)   SpO2 100%   BMI 39.22 kg/m   Heart - Regular rate and rhythm.  No murmurs, gallops or rubs.    Lungs:  Normal respiratory effort, chest expands symmetrically. Lungs are clear to auscultation, no crackles or wheezes. Extrem - wearing compression stockings.  Trace edema   ASSESSMENT/PLAN:   Nephropathy, diabetic ([place]) Symptoms are stable.  On SGLT2 and ARB.  Will check labs next visit  Type 2 diabetes mellitus with diabetic foot deformity A1c stable.  She wants to work on diet and exercise.  On SGLT2 and GLP and metformin   Hyperlipidemia Stable continue statin   Essential hypertension BP Readings from Last 3 Encounters:  05/06/20 118/72  02/13/20 126/80  12/31/19 (!) 143/89   At goal      Lind Covert, MD [place]

## 2020-05-08 ENCOUNTER — Other Ambulatory Visit: Payer: Self-pay | Admitting: Family Medicine

## 2020-05-08 DIAGNOSIS — I10 Essential (primary) hypertension: Secondary | ICD-10-CM

## 2020-05-08 DIAGNOSIS — M21969 Unspecified acquired deformity of unspecified lower leg: Secondary | ICD-10-CM

## 2020-05-22 ENCOUNTER — Ambulatory Visit: Payer: Medicare Other | Admitting: *Deleted

## 2020-05-23 ENCOUNTER — Other Ambulatory Visit: Payer: Self-pay | Admitting: Family Medicine

## 2020-05-25 ENCOUNTER — Other Ambulatory Visit: Payer: Self-pay | Admitting: Family Medicine

## 2020-05-26 ENCOUNTER — Other Ambulatory Visit: Payer: Self-pay | Admitting: *Deleted

## 2020-05-26 NOTE — Patient Outreach (Signed)
[place] [facility]) Care Management  [place]  3/[birth year]67   [name] [birth year] [phone]  RN Health Coach telephone call to patient.  Hipaa compliance verified. Patient A1C has a slight increase from 8.1 to 8.3. Patient stated she is trying to do better with her diet. Per patient she wants to go to the gym but transportation is the issue for now.  RN discussed with patient about doing chair exercises at home and using the [facility] streaming web site.. Patient does not have a medical alert system. Patient has agreed to further outreach calls.   Encounter Medications:  Outpatient Encounter Medications as of 05/26/2020  Medication Sig Note  . acetaminophen (TYLENOL 8 HOUR ARTHRITIS PAIN) 650 MG CR tablet Take 650 mg by mouth every 8 (eight) hours as needed for pain. 08/14/2019: Takes 2 tablets in the morning, 1 tablet at noon, and 2 tablets at nights  . allopurinol (ZYLOPRIM) 100 MG tablet TAKE 1 TABLET BY MOUTH  DAILY   . atorvastatin (LIPITOR) 40 MG tablet TAKE 1 TABLET BY MOUTH  DAILY   . Blood Glucose Monitoring Suppl (ONETOUCH VERIO FLEX SYSTEM) w/Device KIT USE AS DIRECTED   . colchicine 0.6 MG tablet TAKE 1 TABLET BY MOUTH  DAILY   . dorzolamide-timolol (COSOPT) 22.3-6.8 MG/ML ophthalmic solution Place 1 drop into both eyes 2 (two) times daily.   Marland Kitchen exemestane (AROMASIN) 25 MG tablet TAKE 1 TABLET BY MOUTH  DAILY AFTER BREAKFAST   . fluticasone (FLOVENT HFA) 110 MCG/ACT inhaler USE 2 INHALATIONS BY MOUTH  TWICE DAILY   . furosemide (LASIX) 20 MG tablet TAKE 1 TABLET BY MOUTH  EVERY OTHER DAY   . gabapentin (NEURONTIN) 100 MG capsule TAKE 3 CAPSULES BY MOUTH 3  TIMES DAILY   . Insulin Pen Needle (PEN NEEDLES 5/16") 30G X 8 MM MISC Use to inject insulin as directed.  Dx code:E11.69   . JARDIANCE 10 MG TABS tablet TAKE 1 TABLET BY MOUTH  DAILY   . Lancet Devices (ONETOUCH DELICA PLUS LANCING) MISC USE ONCE DAILY   . latanoprost (XALATAN) 0.005 % ophthalmic solution  Place 1 drop into both eyes at bedtime.    . liraglutide (VICTOZA) 18 MG/3ML SOPN Inject 0.3 mLs (1.8 mg total) into the skin daily.   Marland Kitchen losartan (COZAAR) 100 MG tablet TAKE 1 TABLET BY MOUTH AT  BEDTIME   . metFORMIN (GLUCOPHAGE-XR) 500 MG 24 hr tablet TAKE 1 TABLET BY MOUTH  TWICE DAILY   . metoprolol succinate (TOPROL-XL) 25 MG 24 hr tablet TAKE 1 TABLET BY MOUTH  DAILY   . Miconazole Nitrate (LOTRIMIN AF) 2 % AERO Spray between toes once daily for 4 weeks.   Marland Kitchen neomycin-polymyxin b-dexamethasone (MAXITROL) 3.5-10000-0.1 SUSP    . OneTouch Delica Lancets 77O MISC 1 Container by Does not apply route daily as needed.   [name] test strip CHECK BLOOD SUGAR IN THE  MORNING AS DIRECTED   . spironolactone (ALDACTONE) 25 MG tablet TAKE 1 TABLET BY MOUTH  DAILY    No facility-administered encounter medications on file as of 05/26/2020.    Functional Status:  In your present state of health, do you have any difficulty performing the following activities: 11/27/2019 08/22/2019  Hearing? N N  Vision? N N  Difficulty concentrating or making decisions? N N  Walking or climbing stairs? Y Y  Comment - due to knee pain and ambulates with rollator walker  Dressing or bathing? N N  Doing errands, shopping? [name]  Y  Comment - Daughter does Science writer and eating ? - N  Using the Toilet? - N  In the past six months, have you accidently leaked urine? - N  Do you have problems with loss of bowel control? - N  Managing your Medications? - N  Managing your Finances? - N  Housekeeping or managing your Housekeeping? - Y  Comment - daughter assists  Some recent data might be hidden    Fall/Depression Screening: Fall Risk  05/06/2020 02/21/2020 02/13/2020  Falls in the past year? 0 0 0  Number falls in past yr: 0 0 0  Injury with Fall? 0 0 0  Risk Factor Category  - - -  Risk for fall due to : - Impaired balance/gait;Impaired mobility -  Risk for fall due to: Comment - - -  Follow up -  Falls evaluation completed;Falls prevention discussed -  Comment - - -   PHQ 2/9 Scores 05/06/2020 02/21/2020 02/13/2020 11/27/2019 11/13/2019 10/19/2019 08/22/2019  PHQ - 2 Score 1 0 0 1 1 0 1  PHQ- 9 Score - - - - - - -   THN CM Care Plan Problem One     Most Recent Value  Care Plan Problem One Knowledge Deficit in self management of Diabetes  Role Documenting the Problem One [place] for Problem One Active  THN Long Term Goal  Patient will see a decrease in A1C from 8.3 with next blood draw  THN Long Term Goal Start Date 05/26/20  Interventions for Problem One Long Term Goal RN discussed the slight increase of the A1C. RN discussed exercising and dietary adherence. RN will follow up with further discussion  THN CM Short Term Goal #1 Met Date 05/26/20  [facility] CM Short Term Goal #2  Patient will verbalize receiving medical alert system within the next 30 days  THN CM Short Term Goal #2 Start Date 05/26/20  Interventions for Short Term Goal #2 RN discussed with patient about the benefits of getting a medical alert system. RN gave the patient the number to call for [facility] phillip life line. RN will follow up for compliance.  THN CM Short Term Goal #3 Patient will be able to verbalize making health maintenance appointments. RN sent educational material on Diabetes taking care of yourself throughout the year. RN sent Diabetes taking care of yourself day to day. RN will follow up with further outreach for compliance.  Interventions for Short Tern Goal #3 Rn reiterated  the importance of health maintenance. RN discussed exercise classes and keeping up to date on vaccine and A1C draws.RN will follow up with further discussion.  THN CM Short Term Goal #5  Patient will check blood sugaurs and document within the next 30 days  Interventions for Short Term Goal #5 RN discussed monitoring and documenting the blood sugars. Patient has done better but needs to check more frequently. Rn will follow up  with further discussion      Assessment: A1C increased from 8.1 to 8.3 Patient is not able to join gym due to transportation Patient does not have an exercise routine Patient will continue to  benefit from [employer] telephonic outreach for education and support for diabetes self management.  Plan:  Next A1C is to be drawn in September RN discussed medical alert system advantages RN gave patient information to call for medical alert RN discussed starting a home exercise routine RN sent exercise program activity booklet RN discussed healthy eating  RN sent educational material on Planning healthy meals RN will follow up within the month of September RN sent PCP updated information  [place] Management [phone]

## 2020-07-09 ENCOUNTER — Other Ambulatory Visit: Payer: Self-pay

## 2020-07-09 ENCOUNTER — Ambulatory Visit (INDEPENDENT_AMBULATORY_CARE_PROVIDER_SITE_OTHER): Payer: Medicare Other | Admitting: Podiatry

## 2020-07-09 ENCOUNTER — Encounter: Payer: Self-pay | Admitting: Podiatry

## 2020-07-09 DIAGNOSIS — B351 Tinea unguium: Secondary | ICD-10-CM

## 2020-07-09 DIAGNOSIS — E11610 Type 2 diabetes mellitus with diabetic neuropathic arthropathy: Secondary | ICD-10-CM | POA: Diagnosis not present

## 2020-07-09 DIAGNOSIS — E1140 Type 2 diabetes mellitus with diabetic neuropathy, unspecified: Secondary | ICD-10-CM

## 2020-07-09 NOTE — Progress Notes (Signed)
This patient returns to my office for at risk foot care.  This patient requires this care by a professional since this patient will be at risk due to having diabetes.   This patient is unable to cut nails herself since the patient cannot reach her nails.These nails are painful walking and wearing shoes.  This patient presents for at risk foot care today.  General Appearance  Alert, conversant and in no acute stress.  Vascular  Dorsalis pedis and posterior tibial  pulses are palpable  bilaterally.  Capillary return is within normal limits  bilaterally. Temperature is within normal limits  bilaterally.  Neurologic  Senn-Weinstein monofilament wire test absent   bilaterally. Muscle power within normal limits bilaterally.  Nails Thick disfigured discolored nails with subungual debris  from hallux to fifth toes bilaterally. No evidence of bacterial infection or drainage bilaterally.  Orthopedic  No limitations of motion  feet .  No crepitus or effusions noted.  No bony pathology or digital deformities noted. Asymptomatic  HAV  B/L.  Pes planus  B/L.  Skin  normotropic skin with no porokeratosis noted bilaterally.  No signs of infections or ulcers noted.   White mascerates skin interdigitally digits  Right foot.  Onychomycosis  Pain in right toes  Pain in left toes  Tinea pedis  Right foot.  Prescribe lotrimin-af  2 % aerosol spray.  Consent was obtained for treatment procedures.   Mechanical debridement of nails 1-5  bilaterally performed with a nail nipper.  Filed with dremel without incident.    Return office visit  3 months                    Told patient to return for periodic foot care and evaluation due to potential at risk complications. Patient qualifies for diabetic shoes due to DPN and HAV  B/L.   Gardiner Barefoot DPM

## 2020-08-01 ENCOUNTER — Other Ambulatory Visit: Payer: Self-pay | Admitting: Family Medicine

## 2020-08-05 ENCOUNTER — Encounter: Payer: Self-pay | Admitting: Family Medicine

## 2020-08-05 ENCOUNTER — Ambulatory Visit (INDEPENDENT_AMBULATORY_CARE_PROVIDER_SITE_OTHER): Payer: Medicare Other | Admitting: Family Medicine

## 2020-08-05 ENCOUNTER — Other Ambulatory Visit: Payer: Self-pay

## 2020-08-05 VITALS — BP 110/72 | HR 84 | Wt 214.4 lb

## 2020-08-05 DIAGNOSIS — I10 Essential (primary) hypertension: Secondary | ICD-10-CM | POA: Diagnosis not present

## 2020-08-05 DIAGNOSIS — Z23 Encounter for immunization: Secondary | ICD-10-CM | POA: Diagnosis not present

## 2020-08-05 DIAGNOSIS — R2681 Unsteadiness on feet: Secondary | ICD-10-CM | POA: Diagnosis not present

## 2020-08-05 DIAGNOSIS — E1169 Type 2 diabetes mellitus with other specified complication: Secondary | ICD-10-CM | POA: Diagnosis not present

## 2020-08-05 DIAGNOSIS — M21969 Unspecified acquired deformity of unspecified lower leg: Secondary | ICD-10-CM

## 2020-08-05 LAB — POCT GLYCOSYLATED HEMOGLOBIN (HGB A1C): HbA1c, POC (controlled diabetic range): 8.5 % — AB (ref 0.0–7.0)

## 2020-08-05 NOTE — Progress Notes (Signed)
    SUBJECTIVE:   CHIEF COMPLAINT / HPI:   GAIT INSTABILITY For last few weeks feels that she is less steady and having episodes of tremors that she has had in the past.  No specific weakness or visual changes or difficulty speaking or falls or change in medicines.  Does feel worse when she is upright.  Uses walker for all ambulation  PERTINENT  PMH / PSH: lives with her daughter and granddtr (71 yo)   OBJECTIVE:   BP 110/72   Pulse 84   Wt 214 lb 6.4 oz (97.3 kg)   SpO2 97%   BMI 37.98 kg/m   Psych:  Cognition and judgment appear intact. Alert, communicative  and cooperative with normal attention span and concentration. No apparent delusions, illusions, hallucinations Strength - 5/5 all extremities Finger to nose normal Able to stand and walk without assistance using her walker but has to try several times to stand and appears unsteady when turning Heart - Regular rate and rhythm.  No murmurs, gallops or rubs.    Lungs:  Normal respiratory effort, chest expands symmetrically. Lungs are clear to auscultation, no crackles or wheezes. Wearing compression stockings no pitting edema   ASSESSMENT/PLAN:   Gait instability Worsened.  Does not seem to be a focal neuro deficit but more general deconditioning, diabetes neuropathy, and perhaps and orthostatic component.  Will consult Physical Therapy, stop her diuretic and lower her losartan dose   Type 2 diabetes mellitus with diabetic foot deformity Fair control.  Continue her current diabetes medications   Essential hypertension BP Readings from Last 3 Encounters:  08/05/20 110/72  05/06/20 118/72  02/13/20 126/80   I am concerned is having low blood pressures that are contributing to her unsteadiness.  Lower losartan dose      Lind Covert, MD [place]

## 2020-08-05 NOTE — Patient Instructions (Addendum)
Good to see you today!  Thanks for coming in.  For the Balance - stop the Lasix completely  - Take 1/2 tablet of the Losartan = 50 mg  - cut back to 2 tabs of gabapentin   I will put in a referral to Physical Therapy  I will call you if your tests are not good.  Otherwise, I will send you a message on MyChart (if it is active) or a letter in the mail..  If you do not hear from me with in 2 weeks please call our office.     Come back in 3 months or sooner if your balance is worse

## 2020-08-05 NOTE — Assessment & Plan Note (Signed)
Fair control.  Continue her current diabetes medications

## 2020-08-05 NOTE — Assessment & Plan Note (Signed)
Worsened.  Does not seem to be a focal neuro deficit but more general deconditioning, diabetes neuropathy, and perhaps and orthostatic component.  Will consult Physical Therapy, stop her diuretic and lower her losartan dose

## 2020-08-05 NOTE — Assessment & Plan Note (Signed)
BP Readings from Last 3 Encounters:  08/05/20 110/72  05/06/20 118/72  02/13/20 126/80   I am concerned is having low blood pressures that are contributing to her unsteadiness.  Lower losartan dose

## 2020-08-06 ENCOUNTER — Encounter: Payer: Self-pay | Admitting: Family Medicine

## 2020-08-06 LAB — CMP14+EGFR
ALT: 12 IU/L (ref 0–32)
AST: 16 IU/L (ref 0–40)
Albumin/Globulin Ratio: 1.4 (ref 1.2–2.2)
Albumin: 4.1 g/dL (ref 3.7–4.7)
Alkaline Phosphatase: 121 IU/L (ref 48–121)
BUN/Creatinine Ratio: 27 (ref 12–28)
BUN: 31 mg/dL — ABNORMAL HIGH (ref 8–27)
Bilirubin Total: 0.4 mg/dL (ref 0.0–1.2)
CO2: 16 mmol/L — ABNORMAL LOW (ref 20–29)
Calcium: 9.2 mg/dL (ref 8.7–10.3)
Chloride: 104 mmol/L (ref 96–106)
Creatinine, Ser: 1.16 mg/dL — ABNORMAL HIGH (ref 0.57–1.00)
GFR calc Af Amer: 55 mL/min/{1.73_m2} — ABNORMAL LOW (ref >59)
GFR calc non Af Amer: 47 mL/min/{1.73_m2} — ABNORMAL LOW (ref >59)
Globulin, Total: 3 g/dL (ref 1.5–4.5)
Glucose: 159 mg/dL — ABNORMAL HIGH (ref 65–99)
Potassium: 4.2 mmol/L (ref 3.5–5.2)
Sodium: 142 mmol/L (ref 134–144)
Total Protein: 7.1 g/dL (ref 6.0–8.5)

## 2020-08-08 ENCOUNTER — Other Ambulatory Visit: Payer: Medicare Other | Admitting: Orthotics

## 2020-08-17 ENCOUNTER — Other Ambulatory Visit: Payer: Self-pay | Admitting: Family Medicine

## 2020-08-18 ENCOUNTER — Other Ambulatory Visit: Payer: Self-pay | Admitting: Family Medicine

## 2020-08-18 ENCOUNTER — Other Ambulatory Visit: Payer: Self-pay

## 2020-08-18 MED ORDER — ONETOUCH DELICA PLUS LANCING MISC: 2 refills | Status: DC

## 2020-08-19 ENCOUNTER — Other Ambulatory Visit: Payer: Self-pay | Admitting: Family Medicine

## 2020-08-19 DIAGNOSIS — E1169 Type 2 diabetes mellitus with other specified complication: Secondary | ICD-10-CM

## 2020-08-21 DIAGNOSIS — E1142 Type 2 diabetes mellitus with diabetic polyneuropathy: Secondary | ICD-10-CM | POA: Diagnosis not present

## 2020-08-21 DIAGNOSIS — I10 Essential (primary) hypertension: Secondary | ICD-10-CM | POA: Diagnosis not present

## 2020-08-21 DIAGNOSIS — E785 Hyperlipidemia, unspecified: Secondary | ICD-10-CM | POA: Diagnosis not present

## 2020-08-21 DIAGNOSIS — J45909 Unspecified asthma, uncomplicated: Secondary | ICD-10-CM | POA: Diagnosis not present

## 2020-08-21 DIAGNOSIS — E559 Vitamin D deficiency, unspecified: Secondary | ICD-10-CM | POA: Diagnosis not present

## 2020-08-22 ENCOUNTER — Other Ambulatory Visit: Payer: Medicare Other | Admitting: Orthotics

## 2020-08-22 ENCOUNTER — Other Ambulatory Visit: Payer: Self-pay

## 2020-08-26 ENCOUNTER — Other Ambulatory Visit: Payer: Self-pay | Admitting: *Deleted

## 2020-08-26 NOTE — Patient Outreach (Signed)
[place] [facility]) Care Management  3/84/5364  [name] [birth year] [phone]  RN Health Coach attempted follow up outreach call to patient.  Patient was eating breakfast and asked for a call back.  Plan: RN will call patient again within 30 days.  [place] Care Management [phone]

## 2020-08-27 ENCOUNTER — Other Ambulatory Visit: Payer: Self-pay

## 2020-08-27 ENCOUNTER — Encounter: Payer: Self-pay | Admitting: Physical Therapy

## 2020-08-27 ENCOUNTER — Ambulatory Visit: Payer: Medicare Other | Attending: Family Medicine | Admitting: Physical Therapy

## 2020-08-27 DIAGNOSIS — M6281 Muscle weakness (generalized): Secondary | ICD-10-CM

## 2020-08-27 DIAGNOSIS — R2681 Unsteadiness on feet: Secondary | ICD-10-CM

## 2020-08-27 DIAGNOSIS — R2689 Other abnormalities of gait and mobility: Secondary | ICD-10-CM | POA: Diagnosis not present

## 2020-08-27 NOTE — Patient Instructions (Signed)
Access Code: [email or web address] URL: [email or web address] Date: 08/27/2020 Prepared by: Estill Bamberg April Thurnell Garbe  Exercises Seated Ankle Eversion with Resistance - 1 x daily - 7 x weekly - 2 sets - 10 reps Seated Ankle Inversion with Anchored Resistance - 1 x daily - 7 x weekly - 2 sets - 10 reps Hip Abduction with Resistance Loop - 1 x daily - 7 x weekly - 2 sets - 10 reps Hip Extension with Resistance Loop - 1 x daily - 7 x weekly - 2 sets - 10 reps Standing Hip Flexion with Resistance Loop - 1 x daily - 7 x weekly - 2 sets - 10 reps Heel rises with counter support - 1 x daily - 7 x weekly - 3 sets - 10 reps Heel Toe Raises with Counter Support - 1 x daily - 7 x weekly - 3 sets - 10 reps

## 2020-08-27 NOTE — Therapy (Signed)
[place], [place], [address] Phone: [phone]   Fax:  [phone]  Physical Therapy Evaluation  Patient Details  Name: [name] MRN: [id number] Date of Birth: June 13, 1951 Referring Provider (PT): Lind Covert, MD   Encounter Date: 08/27/2020   PT End of Session - 08/27/20 0907    Visit Number 1    Number of Visits 8    Date for PT Re-Evaluation 10/22/20    Authorization Type [employer] medicare -- needs 10th visit progress note    Progress Note Due on Visit 10    PT Start Time 0912    PT Stop Time 1000    PT Time Calculation (min) 48 min    Activity Tolerance Patient tolerated treatment well    Behavior During Therapy [facility] for tasks assessed/performed           Past Medical History:  Diagnosis Date  . Anemia   . Arthritis   . Asthma   . Blood transfusion without reported diagnosis    Patients believes 2015 when she overdosed on "medications"   . Breast cancer of lower-outer quadrant of right female breast ([place]) 02/13/2015  . Cataract   . CHF, acute ([place]) 05/03/2012  . Depression   . Diabetes mellitus   . Eczema   . Fatty liver    Fatty infiltration of liver noted on 03/2012 CT scan  . Fibromyalgia   . Glaucoma   . Heart disease   . History of kidney stones    w/ hx of hydronephrosis - followed by [facility]  . HIV nonspecific serology    2006: indeterminate HIV blood test, seen by ID, felt secondary to cross reacting antibodies with no further workup felt necessary at that time   . Hypertension   . Obesity   . Personal history of radiation therapy 2016   right  . Radiation 05/07/15-06/23/15   Right Breast    Past Surgical History:  Procedure Laterality Date  . BREAST BIOPSY Left 02/10/2015   malignant   . BREAST LUMPECTOMY Right 03/21/2015  . CHOLECYSTECTOMY  2003  . CYSTOSCOPY W/ LITHOLAPAXY / E[facility]    . JOINT REPLACEMENT     bilateral knee replacement  . LEFT AND  RIGHT HEART CATHETERIZATION WITH CORONARY ANGIOGRAM N/A 05/02/2012   Procedure: LEFT AND RIGHT HEART CATHETERIZATION WITH CORONARY ANGIOGRAM;  Surgeon: Burnell Blanks, MD;  Location: [facility] CATH LAB;  Service: Cardiovascular;  Laterality: N/A;  . RADIOACTIVE SEED GUIDED PARTIAL MASTECTOMY WITH AXILLARY SENTINEL LYMPH NODE BIOPSY Right 03/21/2015   Procedure: RIGHT  PARTIAL MASTECTOMY WITH RADIOACTIVE SEED LOCALIZATION RIGHT  AXILLARY SENTINEL LYMPH NODE BIOPSY;  Surgeon: Fanny Skates, MD;  Location: [facility];  Service: General;  Laterality: Right;  . REPLACEMENT TOTAL KNEE BILATERAL  2005 &2006  . VASCULAR SURGERY Right 03/15/2013   Ultrasound guided sclerotherapy    There were no vitals filed for this visit.    Subjective Assessment - 08/27/20 0916    Subjective Pt states she's back in PT for balance. No recent falls but just losing her balance. Pt states she does not use rollator all the time at home. Pt states she does not always use her cane ta home either. Pt believes she loses her balance more to the left. Pt utilizes special orthotics for her feet. Pt wears her shoes in the house until 5pm. Pt states her last balance loss was this morning while turning.    Pertinent History  Diabetes, arthritis, flat feet    Limitations Walking    How long can you sit comfortably? n/a    How long can you stand comfortably? n/a    How long can you walk comfortably? n/a    Diagnostic tests n/a    Patient Stated Goals Improve balance and reduce her loss of balance    Currently in Pain? No/denies              [facility] PT Assessment - 08/27/20 0001      Assessment   Medical Diagnosis R26.81 (ICD-10-CM) - Gait instability    Referring Provider (PT) Lind Covert, MD    Prior Therapy PT for balance in 2020      Precautions   Precautions Fall      Restrictions   Weight Bearing Restrictions No      Balance Screen   Has the patient fallen in the past 6 months No    Has  the patient had a decrease in activity level because of a fear of falling?  No    Is the patient reluctant to leave their home because of a fear of falling?  No      Home Environment   Living Environment Private residence    Living Arrangements Children    Type of [place] - single point;Walker - 4 wheels      Prior Function   Level of Independence Independent      Observation/Other Assessments   Focus on Therapeutic Outcomes (FOTO)  41%      ROM / Strength   AROM / PROM / Strength Strength      Strength   Overall Strength Comments 4/5 grossly in ankle and hip; 5/5 bilat knee flex/ext      Standardized Balance Assessment   Five times sit to stand comments  30 sec -- limited due to knee pain and LOB after 3rd STS      Berg Balance Test   Sit to Stand Able to stand  independently using hands    Standing Unsupported Able to stand 2 minutes with supervision   wide BOS with increased sway   Sitting with Back Unsupported but Feet Supported on Floor or Stool Able to sit safely and securely 2 minutes    Stand to Sit Controls descent by using hands    Transfers Able to transfer safely, definite need of hands    Standing Unsupported with Eyes Closed Able to stand 10 seconds with supervision    Standing Unsupported with Feet Together Able to place feet together independently and stand for 1 minute with supervision    From Standing, Reach Forward with Outstretched Arm Can reach forward >5 cm safely (2")    From Standing Position, Pick up Object from Floor Able to pick up shoe, needs supervision   with LOB upon return to full standing   From Standing Position, Turn to Look Behind Over each Shoulder Needs assist to keep from losing balance and falling   Major LOB to R   Turn 360 Degrees Able to turn 360 degrees safely but slowly    Standing Unsupported, Alternately Place Feet on Step/Stool Able to complete >2  steps/needs minimal assist    Standing Unsupported, One Foot in [employer] balance while stepping or standing    Standing on One Leg Unable to try or needs assist to prevent  fall    Total Score 30      Timed Up and Go Test   Normal TUG (seconds) 14.5   with rollator; 1 LOB   TUG Comments 12.69   no a/d                     Objective measurements completed on examination: See above findings.               PT Education - 08/27/20 1222    Education Details POC, HEP, exam findings, FOTO    Person(s) Educated Patient    Methods Explanation;Demonstration;Tactile cues;Verbal cues;Handout    Comprehension Verbalized understanding;Returned demonstration;Verbal cues required;Tactile cues required            PT Short Term Goals - 08/27/20 1223      PT SHORT TERM GOAL #1   Title Pt will be independent with initial HEP    Time 4    Period Weeks    Status New    Target Date 09/24/20      PT SHORT TERM GOAL #2   Title Pt will be able to demonstate 5x STS without UE and no LOB    Baseline LOB after 3 reps    Time 4    Period Weeks    Status New    Target Date 09/24/20      PT SHORT TERM GOAL #3   Title Pt will improve TUG score to at least 12 sec    Time 4    Period Weeks    Status New    Target Date 09/24/20             PT Long Term Goals - 08/27/20 1225      PT LONG TERM GOAL #1   Title Pt will be independent with advanced HEP    Time 8    Period Weeks    Status New    Target Date 10/22/20      PT LONG TERM GOAL #2   Title Pt will improve 5x STS to at least 18 sec or less    Baseline 30 sec    Time 8    Period Weeks    Status New    Target Date 10/22/20      PT LONG TERM GOAL #3   Title Pt will improve Berg Balance to at least 40/56 for decreased fall risk    Baseline 30/56    Time 8    Period Weeks    Status New    Target Date 10/22/20      PT LONG TERM GOAL #4   Title Pt will have improved TUG score to at least 10 sec for  reduced fall risk    Time 8    Period Weeks    Status New    Target Date 10/22/20                  Plan - 08/27/20 1216    Clinical Impression Statement Pt is a 69 y/o F presenting to OPPT due to gait instability and multiple LOBs. Pt's Berg Balance, TUG, and 5x STS place her at high risk of falls. It appears most of pt's balance deficits may stem from decreased ankle & hip strength and poor ability to weight shift and maintain narrower BOS. Pt would benefit from physical therapy to address these issues and improve her balance/reduce fall risk for increased safety at home and in the community.  Personal Factors and Comorbidities Time since onset of injury/illness/exacerbation;Comorbidity 1;Comorbidity 2;Past/Current Experience    Comorbidities Diabetes, arthritis    Examination-Activity Limitations Stairs;Stand;Locomotion Level    Examination-Participation Restrictions Community Activity;Laundry;Meal Prep;Cleaning    Stability/Clinical Decision Making Stable/Uncomplicated    Clinical Decision Making Low    Rehab Potential Good    PT Frequency 1x / week    PT Duration 8 weeks    PT Treatment/Interventions Aquatic Therapy;Electrical Stimulation;Iontophoresis 4mg /ml Dexamethasone;Moist Heat;Cryotherapy;Ultrasound;Gait training;Stair training;Functional mobility training;Therapeutic activities;Therapeutic exercise;Balance training;Neuromuscular re-education;Patient/family education;Manual techniques;Taping;Dry needling;Vasopneumatic Device    PT Next Visit Plan Assess response to HEP. Continue to progress pt's ankle/hip strengthening. Work on balance/weight shifting.    PT Home Exercise Plan Access Code: [email or web address]    Consulted and Agree with Plan of Care Patient           Patient will benefit from skilled therapeutic intervention in order to improve the following deficits and impairments:  Decreased balance, Decreased mobility, Difficulty walking, Decreased activity tolerance,  Decreased strength  Visit Diagnosis: Unsteadiness on feet  Muscle weakness (generalized)  Other abnormalities of gait and mobility  Gait instability     Problem List Patient Active Problem List   Diagnosis Date Noted  . Tinea pedis 04/09/2020  . Spinal stenosis of lumbar region without neurogenic claudication 02/08/2019  . Nephropathy, diabetic ([place]) 02/07/2019  . Gout 05/16/2018  . Gait instability 01/03/2018  . Frequent bowel movements 11/02/2017  . Tremor of both hands   . Stuttering   . Essential hypertension   . Hyperlipidemia 08/13/2015  . Breast cancer of lower-outer quadrant of right female breast ([place]) 02/13/2015  . Vitamin D deficiency 02/06/2015  . Type 2 diabetes mellitus with diabetic foot deformity ([place]) 01/01/2014  . Chronic combined systolic and diastolic heart failure ([place]) 02/26/2013  . OBESITY 02/05/2009  . Asthma, intermittent 02/08/2008  . NEPHROLITHIASIS 01/26/2007  . DJD (degenerative joint disease), multiple sites 01/26/2007    [facility] April Gordy Levan PT, DPT 08/27/2020, 12:33 PM  [facility] [address] [place], [place], [address] Phone: [phone]   Fax:  [phone]  Name: JAZELYN SIPE MRN: [id number] Date of Birth: [birth year]

## 2020-08-28 ENCOUNTER — Other Ambulatory Visit: Payer: Self-pay | Admitting: *Deleted

## 2020-08-28 NOTE — Patient Outreach (Signed)
[facility]) Care Management  [place]  6/65/9935   [name] [phone]  RN Health Coach telephone call to patient.  [employer] compliance verified. Per patient she has started physical therapy. She is having tremors and weakness and her gait is unsteady.  She ambulates with a cane or walker .She has not had any recent falls. Patient sister and daughter does her errands. Patient stated she has not been following her diet. Her A1C has increased from 8.3 to 8.5. Patient blood pressure medication has been decreased to 1/2 dose. Per patient she is unable to cut in half due to it crumbling. RN discussed getting a pill cutter and patient stated she had no way to get it. Patient son works night shift and sleeps during the day. Patient has agreed to further outreach calls.    Encounter Medications:  Outpatient Encounter Medications as of 08/28/2020  Medication Sig Note  . gabapentin (NEURONTIN) 100 MG capsule TAKE 2 CAPSULES BY MOUTH 3  TIMES DAILY   . spironolactone (ALDACTONE) 25 MG tablet TAKE 1 TABLET BY MOUTH  DAILY   . acetaminophen (TYLENOL 8 HOUR ARTHRITIS PAIN) 650 MG CR tablet Take 650 mg by mouth every 8 (eight) hours as needed for pain. 08/14/2019: Takes 2 tablets in the morning, 1 tablet at noon, and 2 tablets at nights  . allopurinol (ZYLOPRIM) 100 MG tablet TAKE 1 TABLET BY MOUTH  DAILY   . atorvastatin (LIPITOR) 40 MG tablet TAKE 1 TABLET BY MOUTH  DAILY   . Blood Glucose Monitoring Suppl (ONETOUCH VERIO FLEX SYSTEM) w/Device KIT USE AS DIRECTED   . colchicine 0.6 MG tablet TAKE 1 TABLET BY MOUTH  DAILY   . dorzolamide-timolol (COSOPT) 22.3-6.8 MG/ML ophthalmic solution Place 1 drop into both eyes 2 (two) times daily.   . fluticasone (FLOVENT HFA) 110 MCG/ACT inhaler USE 2 INHALATIONS BY MOUTH  TWICE DAILY   . Insulin Pen Needle (PEN NEEDLES 5/16") 30G X 8 MM MISC Use to inject insulin as directed.  Dx code:E11.69   . JARDIANCE 10 MG TABS tablet  TAKE 1 TABLET BY MOUTH  DAILY   . Lancet Devices (ONETOUCH DELICA PLUS LANCING) MISC USE ONCE DAILY   . latanoprost (XALATAN) 0.005 % ophthalmic solution Place 1 drop into both eyes at bedtime.    Marland Kitchen losartan (COZAAR) 100 MG tablet Take 0.5 tablets (50 mg total) by mouth at bedtime.   . metFORMIN (GLUCOPHAGE-XR) 500 MG 24 hr tablet TAKE 1 TABLET BY MOUTH  TWICE DAILY   . metoprolol succinate (TOPROL-XL) 25 MG 24 hr tablet TAKE 1 TABLET BY MOUTH  DAILY   . Miconazole Nitrate (LOTRIMIN AF) 2 % AERO Spray between toes once daily for 4 weeks.   Marland Kitchen neomycin-polymyxin b-dexamethasone (MAXITROL) 3.5-10000-0.1 SUSP    . OneTouch Delica Lancets 00T MISC 1 Container by Does not apply route daily as needed.   [name] test strip CHECK BLOOD SUGAR IN THE  MORNING AS DIRECTED   . VICTOZA 18 MG/3ML SOPN INJECT 1.8 MG (0.3 ML)  SUBCUTANEOUSLY DAILY    No facility-administered encounter medications on file as of 08/28/2020.    Functional Status:  In your present state of health, do you have any difficulty performing the following activities: 11/27/2019  Hearing? N  Vision? N  Difficulty concentrating or making decisions? N  Walking or climbing stairs? Y  Dressing or bathing? N  Doing errands, shopping? Y  Some recent data might be hidden  Fall/Depression Screening: Fall Risk  08/28/2020 08/05/2020 05/06/2020  Falls in the past year? 0 0 0  Number falls in past yr: 0 0 0  Injury with Fall? 0 0 0  Risk Factor Category  - - -  Risk for fall due to : Impaired balance/gait;Impaired mobility - -  Risk for fall due to: Comment - - -  Follow up Falls evaluation completed - -  Comment - - -   PHQ 2/9 Scores 05/06/2020 02/21/2020 02/13/2020 11/27/2019 11/13/2019 10/19/2019 08/22/2019  PHQ - 2 Score 1 0 0 1 1 0 1  PHQ- 9 Score - - - - - - -    Assessment:  Goals Addressed              This Visit's Progress   .  HEMOGLOBIN A1C < 7 (pt-stated)        Patient stated she would like to see her A1C  less than 7. Pt stated she is eager to start walking around her street again. Pt reported she was walking "a lot" before COVID and hopes to get back to walking once social distancing is over.       .  Learn More About My Health        Follow Up Date [id number]   - make a list of questions - bring a list of my medicines to the visit - speak up when I don't understand    Why is this important?   The best way to learn about your health and care is by talking to the doctor and nurse.  They will answer your questions and give you information in the way that you like best.    Notes:     Marland Kitchen  Make and Keep All Appointments        Follow Up Date [id number]   - arrange a ride through an agency 1 week before appointment - call to cancel if needed - keep a calendar with prescription refill dates - keep a calendar with appointment dates    Why is this important?   Part of staying healthy is seeing the doctor for follow-up care.  If you forget your appointments, there are some things you can do to stay on track.    Notes:     Marland Kitchen  Monitor and Manage My Blood Sugar        Follow Up Date [id number]   - check blood sugar at prescribed times - check blood sugar if I feel it is too high or too low - enter blood sugar readings and medication or insulin into daily log - take the blood sugar log to all doctor visits - take the blood sugar meter to all doctor visits    Why is this important?   Checking your blood sugar at home helps to keep it from getting very high or very low.  Writing the results in a diary or log helps the doctor know how to care for you.  Your blood sugar log should have the time, date and the results.  Also, write down the amount of insulin or other medicine that you take.  Other information, like what you ate, exercise done and how you were feeling, will also be helpful.     Notes:     .  Obtain Eye Exam        Follow Up Date [id number]   - schedule appointment with eye  doctor    Why is this important?  Eye check-ups are important when you have diabetes.  Vision loss can be prevented.    Notes:     .  Perform Foot Care        Follow Up Date [id number]   - check feet daily for cuts, sores or redness - keep feet up while sitting - trim toenails straight across - wash and dry feet carefully every day - wear comfortable, cotton socks - wear comfortable, well-fitting shoes    Why is this important?   Good foot care is very important when you have diabetes.  There are many things you can do to keep your feet healthy and catch a problem early.    Notes:     .  Set My Target A1C        Follow Up Date [id number]   - set target A1C    Why is this important?   Your target A1C is decided together by you and your doctor.  It is based on several things like your age and other health issues.    Notes:        Plan:  Patient will follow through with Physical therapy RN sent pill cutter RN discussed getting the COVID booster Patient will monitor blood sugars as per ordered and document RN will follow up within the month of December  Jovontae Banko [place] [facility] [phone]

## 2020-09-01 ENCOUNTER — Other Ambulatory Visit: Payer: Self-pay | Admitting: *Deleted

## 2020-09-01 MED ORDER — ONETOUCH DELICA PLUS LANCING MISC
2 refills | Status: DC
Start: 2020-09-01 — End: 2021-03-09

## 2020-09-04 ENCOUNTER — Other Ambulatory Visit: Payer: Self-pay

## 2020-09-04 ENCOUNTER — Encounter: Payer: Self-pay | Admitting: Physical Therapy

## 2020-09-04 ENCOUNTER — Ambulatory Visit: Payer: Medicare Other | Attending: Family Medicine | Admitting: Physical Therapy

## 2020-09-04 DIAGNOSIS — R2681 Unsteadiness on feet: Secondary | ICD-10-CM | POA: Insufficient documentation

## 2020-09-04 DIAGNOSIS — M6281 Muscle weakness (generalized): Secondary | ICD-10-CM | POA: Diagnosis not present

## 2020-09-04 DIAGNOSIS — R2689 Other abnormalities of gait and mobility: Secondary | ICD-10-CM | POA: Diagnosis not present

## 2020-09-04 NOTE — Therapy (Signed)
[place], [place], [address] Phone: [phone]   Fax:  [phone]  Physical Therapy Treatment  Patient Details  Name: [name] MRN: [id number] Date of Birth: 06/03/1951 Referring Provider (PT): Lind Covert, MD   Encounter Date: 09/04/2020   PT End of Session - 09/04/20 1123    Visit Number 2    Number of Visits 8    Date for PT Re-Evaluation 10/22/20    Authorization Type UHC medicare -- needs 10th visit progress note    PT Start Time 1102    PT Stop Time 1145    PT Time Calculation (min) 43 min           Past Medical History:  Diagnosis Date  . Anemia   . Arthritis   . Asthma   . Blood transfusion without reported diagnosis    Patients believes 2015 when she overdosed on "medications"   . Breast cancer of lower-outer quadrant of right female breast ([place]) 02/13/2015  . Cataract   . CHF, acute ([place]) 05/03/2012  . Depression   . Diabetes mellitus   . Eczema   . Fatty liver    Fatty infiltration of liver noted on 03/2012 CT scan  . Fibromyalgia   . Glaucoma   . Heart disease   . History of kidney stones    w/ hx of hydronephrosis - followed by [facility]  . HIV nonspecific serology    2006: indeterminate HIV blood test, seen by ID, felt secondary to cross reacting antibodies with no further workup felt necessary at that time   . Hypertension   . Obesity   . Personal history of radiation therapy 2016   right  . Radiation 05/07/15-06/23/15   Right Breast    Past Surgical History:  Procedure Laterality Date  . BREAST BIOPSY Left 02/10/2015   malignant   . BREAST LUMPECTOMY Right 03/21/2015  . CHOLECYSTECTOMY  2003  . CYSTOSCOPY W/ LITHOLAPAXY / EHL    . JOINT REPLACEMENT     bilateral knee replacement  . LEFT AND RIGHT HEART CATHETERIZATION WITH CORONARY ANGIOGRAM N/A 05/02/2012   Procedure: LEFT AND RIGHT HEART CATHETERIZATION WITH CORONARY ANGIOGRAM;  Surgeon:  Burnell Blanks, MD;  Location: [facility] CATH LAB;  Service: Cardiovascular;  Laterality: N/A;  . RADIOACTIVE SEED GUIDED PARTIAL MASTECTOMY WITH AXILLARY SENTINEL LYMPH NODE BIOPSY Right 03/21/2015   Procedure: RIGHT  PARTIAL MASTECTOMY WITH RADIOACTIVE SEED LOCALIZATION RIGHT  AXILLARY SENTINEL LYMPH NODE BIOPSY;  Surgeon: Fanny Skates, MD;  Location: [facility];  Service: General;  Laterality: Right;  . REPLACEMENT TOTAL KNEE BILATERAL  2005 &2006  . VASCULAR SURGERY Right 03/15/2013   Ultrasound guided sclerotherapy    There were no vitals filed for this visit.   Subjective Assessment - 09/04/20 1106    Subjective Most pain is in knees and wrists.    Currently in Pain? Yes    Pain Score 7     Pain Location Knee    Pain Orientation Right;Left    Pain Descriptors / Indicators Aching    Pain Type Chronic pain    Aggravating Factors  trying to get up especially after prolonged sitting    Pain Relieving Factors muscles rubs                             [facility] Adult PT Treatment/Exercise - 09/04/20 0001      Knee/Hip  Exercises: Standing   Heel Raises 20 reps    Heel Raises Limitations toe x 20     Hip Flexion 10 reps    Hip Abduction 10 reps;2 sets    Abduction Limitations Green     Hip Extension 10 reps;2 sets   cues to slow repetitions   Extension Limitations Green      Knee/Hip Exercises: Seated   Sit to Sand 2 sets;10 reps   Ue to rise      Knee/Hip Exercises: Supine   Bridges 15 reps    Bridges Limitations 5 sec     Straight Leg Raises 10 reps      Ankle Exercises: Seated   Other Seated Ankle Exercises review ankle inversion/eversion                    PT Short Term Goals - 08/27/20 1223      PT SHORT TERM GOAL #1   Title Pt will be independent with initial HEP    Time 4    Period Weeks    Status New    Target Date 09/24/20      PT SHORT TERM GOAL #2   Title Pt will be able to demonstate 5x STS without UE and no LOB     Baseline LOB after 3 reps    Time 4    Period Weeks    Status New    Target Date 09/24/20      PT SHORT TERM GOAL #3   Title Pt will improve TUG score to at least 12 sec    Time 4    Period Weeks    Status New    Target Date 09/24/20             PT Long Term Goals - 08/27/20 1225      PT LONG TERM GOAL #1   Title Pt will be independent with advanced HEP    Time 8    Period Weeks    Status New    Target Date 10/22/20      PT LONG TERM GOAL #2   Title Pt will improve 5x STS to at least 18 sec or less    Baseline 30 sec    Time 8    Period Weeks    Status New    Target Date 10/22/20      PT LONG TERM GOAL #3   Title Pt will improve Berg Balance to at least 40/56 for decreased fall risk    Baseline 30/56    Time 8    Period Weeks    Status New    Target Date 10/22/20      PT LONG TERM GOAL #4   Title Pt will have improved TUG score to at least 10 sec for reduced fall risk    Time 8    Period Weeks    Status New    Target Date 10/22/20                 Plan - 09/04/20 1108    Clinical Impression Statement Chey required min assist to perfrom sit-stand out of lobby chair today. From mat table she could perform repetitive sit-stands with UE ro rise. Reviewed HEP and added some mat strengthening in today. Good tolerance with session.    PT Next Visit Plan Assess response to HEP. Continue to progress pt's ankle/hip strengthening. Work on balance/weight shifting.    PT Home Exercise Plan Access Code: [email or web address]  Patient will benefit from skilled therapeutic intervention in order to improve the following deficits and impairments:  Decreased balance, Decreased mobility, Difficulty walking, Decreased activity tolerance, Decreased strength  Visit Diagnosis: Unsteadiness on feet  Muscle weakness (generalized)  Other abnormalities of gait and mobility  Gait instability     Problem List Patient Active Problem List   Diagnosis Date Noted  .  Tinea pedis 04/09/2020  . Spinal stenosis of lumbar region without neurogenic claudication 02/08/2019  . Nephropathy, diabetic ([place]) 02/07/2019  . Gout 05/16/2018  . Gait instability 01/03/2018  . Frequent bowel movements 11/02/2017  . Tremor of both hands   . Stuttering   . Essential hypertension   . Hyperlipidemia 08/13/2015  . Breast cancer of lower-outer quadrant of right female breast ([place]) 02/13/2015  . Vitamin D deficiency 02/06/2015  . Type 2 diabetes mellitus with diabetic foot deformity ([place]) 01/01/2014  . Chronic combined systolic and diastolic heart failure ([place]) 02/26/2013  . OBESITY 02/05/2009  . Asthma, intermittent 02/08/2008  . NEPHROLITHIASIS 01/26/2007  . DJD (degenerative joint disease), multiple sites 01/26/2007    Dorene Ar, PTA 09/04/2020, 12:03 PM  [facility] [address] [place], [place], [address] Phone: [phone]   Fax:  [phone]  Name: CLARYSSA SANDNER MRN: [id number] Date of Birth: 1951-10-16

## 2020-09-11 ENCOUNTER — Ambulatory Visit: Payer: Medicare Other | Admitting: Physical Therapy

## 2020-09-11 ENCOUNTER — Other Ambulatory Visit: Payer: Self-pay

## 2020-09-11 ENCOUNTER — Encounter: Payer: Self-pay | Admitting: Physical Therapy

## 2020-09-11 DIAGNOSIS — R2689 Other abnormalities of gait and mobility: Secondary | ICD-10-CM

## 2020-09-11 DIAGNOSIS — M6281 Muscle weakness (generalized): Secondary | ICD-10-CM | POA: Diagnosis not present

## 2020-09-11 DIAGNOSIS — R2681 Unsteadiness on feet: Secondary | ICD-10-CM

## 2020-09-11 NOTE — Therapy (Signed)
[name], [place], [address] Phone: [phone]   Fax:  [phone]  Physical Therapy Treatment  Patient Details  Name: [name] MRN: [id number] Date of Birth: 1950-12-12 Referring Provider (PT): Lind Covert, MD   Encounter Date: 09/11/2020   PT End of Session - 09/11/20 0959    Visit Number 3    Number of Visits 8    Date for PT Re-Evaluation 10/22/20    Authorization Type UHC medicare -- needs 10th visit progress note    Progress Note Due on Visit 10    PT Start Time 0930    PT Stop Time 1015    PT Time Calculation (min) 45 min           Past Medical History:  Diagnosis Date  . Anemia   . Arthritis   . Asthma   . Blood transfusion without reported diagnosis    Patients believes 2015 when she overdosed on "medications"   . Breast cancer of lower-outer quadrant of right female breast ([place]) 02/13/2015  . Cataract   . CHF, acute ([place]) 05/03/2012  . Depression   . Diabetes mellitus   . Eczema   . Fatty liver    Fatty infiltration of liver noted on 03/2012 CT scan  . Fibromyalgia   . Glaucoma   . Heart disease   . History of kidney stones    w/ hx of hydronephrosis - followed by [facility]  . HIV nonspecific serology    2006: indeterminate HIV blood test, seen by ID, felt secondary to cross reacting antibodies with no further workup felt necessary at that time   . Hypertension   . Obesity   . Personal history of radiation therapy 2016   right  . Radiation 05/07/15-06/23/15   Right Breast    Past Surgical History:  Procedure Laterality Date  . BREAST BIOPSY Left 02/10/2015   malignant   . BREAST LUMPECTOMY Right 03/21/2015  . CHOLECYSTECTOMY  2003  . CYSTOSCOPY W/ LITHOLAPAXY / EHL    . JOINT REPLACEMENT     bilateral knee replacement  . LEFT AND RIGHT HEART CATHETERIZATION WITH CORONARY ANGIOGRAM N/A 05/02/2012   Procedure: LEFT AND RIGHT HEART CATHETERIZATION WITH  CORONARY ANGIOGRAM;  Surgeon: Burnell Blanks, MD;  Location: [facility] CATH LAB;  Service: Cardiovascular;  Laterality: N/A;  . RADIOACTIVE SEED GUIDED PARTIAL MASTECTOMY WITH AXILLARY SENTINEL LYMPH NODE BIOPSY Right 03/21/2015   Procedure: RIGHT  PARTIAL MASTECTOMY WITH RADIOACTIVE SEED LOCALIZATION RIGHT  AXILLARY SENTINEL LYMPH NODE BIOPSY;  Surgeon: Fanny Skates, MD;  Location: [facility];  Service: General;  Laterality: Right;  . REPLACEMENT TOTAL KNEE BILATERAL  2005 &2006  . VASCULAR SURGERY Right 03/15/2013   Ultrasound guided sclerotherapy    There were no vitals filed for this visit.   Subjective Assessment - 09/11/20 0931    Subjective I am achey. Worst is still the knee.    Currently in Pain? Yes    Pain Score 8     Pain Location Knee    Pain Orientation Left;Right    Pain Descriptors / Indicators Aching    Aggravating Factors  getting up after sitting    Pain Relieving Factors muscle rubs                             [facility] Adult PT Treatment/Exercise - 09/11/20 0001      Neuro Re-ed  Neuro Re-ed Details  staggered stand with 1 UE support, narrow stance without UE- 3 LOB, On airex,-normal stance without UE, marching with bilat UE      Knee/Hip Exercises: Standing   Heel Raises 20 reps    Heel Raises Limitations toe x 20     Hip Flexion 20 reps    Hip Abduction 10 reps;2 sets    Abduction Limitations Green     Hip Extension 10 reps;2 sets   cues to slow repetitions   Extension Limitations Green      Knee/Hip Exercises: Seated   Long Arc Quad 20 reps    Marching 20 reps    Sit to [employer] 2 sets;10 reps   no UE, 1 set from airex on mat      Knee/Hip Exercises: Supine   Bridges 20 reps    Straight Leg Raises 10 reps    Straight Leg Raises Limitations with ab brace                     PT Short Term Goals - 08/27/20 1223      PT SHORT TERM GOAL #1   Title Pt will be independent with initial HEP    Time 4    Period  Weeks    Status New    Target Date 09/24/20      PT SHORT TERM GOAL #2   Title Pt will be able to demonstate 5x STS without UE and no LOB    Baseline LOB after 3 reps    Time 4    Period Weeks    Status New    Target Date 09/24/20      PT SHORT TERM GOAL #3   Title Pt will improve TUG score to at least 12 sec    Time 4    Period Weeks    Status New    Target Date 09/24/20             PT Long Term Goals - 08/27/20 1225      PT LONG TERM GOAL #1   Title Pt will be independent with advanced HEP    Time 8    Period Weeks    Status New    Target Date 10/22/20      PT LONG TERM GOAL #2   Title Pt will improve 5x STS to at least 18 sec or less    Baseline 30 sec    Time 8    Period Weeks    Status New    Target Date 10/22/20      PT LONG TERM GOAL #3   Title Pt will improve Berg Balance to at least 40/56 for decreased fall risk    Baseline 30/56    Time 8    Period Weeks    Status New    Target Date 10/22/20      PT LONG TERM GOAL #4   Title Pt will have improved TUG score to at least 10 sec for reduced fall risk    Time 8    Period Weeks    Status New    Target Date 10/22/20                 Plan - 09/11/20 1155    Clinical Impression Statement [name] did not require assist from lobby chair for sit-stand today. She was able to complete 10 sit-stands from mat table without rest and without UE. Focused standing balance and proximal stability  today to reduce risk of falls.    PT Next Visit Plan Assess response to HEP. Continue to progress pt's ankle/hip strengthening. Work on balance/weight shifting.    PT Home Exercise Plan Access Code: [email or web address]           Patient will benefit from skilled therapeutic intervention in order to improve the following deficits and impairments:  Decreased balance, Decreased mobility, Difficulty walking, Decreased activity tolerance, Decreased strength  Visit Diagnosis: Unsteadiness on feet  Muscle weakness  (generalized)  Other abnormalities of gait and mobility  Gait instability     Problem List Patient Active Problem List   Diagnosis Date Noted  . Tinea pedis 04/09/2020  . Spinal stenosis of lumbar region without neurogenic claudication 02/08/2019  . Nephropathy, diabetic ([place]) 02/07/2019  . Gout 05/16/2018  . Gait instability 01/03/2018  . Frequent bowel movements 11/02/2017  . Tremor of both hands   . Stuttering   . Essential hypertension   . Hyperlipidemia 08/13/2015  . Breast cancer of lower-outer quadrant of right female breast ([place]) 02/13/2015  . Vitamin D deficiency 02/06/2015  . Type 2 diabetes mellitus with diabetic foot deformity ([place]) 01/01/2014  . Chronic combined systolic and diastolic heart failure ([place]) 02/26/2013  . OBESITY 02/05/2009  . Asthma, intermittent 02/08/2008  . NEPHROLITHIASIS 01/26/2007  . DJD (degenerative joint disease), multiple sites 01/26/2007    [name], PTA 09/11/2020, 12:08 PM  [facility] [address] [place], [place], [address] Phone: [phone]   Fax:  [phone]  Name: [name] MRN: [id number] Date of Birth: 10-01-1951

## 2020-09-16 DIAGNOSIS — H538 Other visual disturbances: Secondary | ICD-10-CM | POA: Diagnosis not present

## 2020-09-16 DIAGNOSIS — E119 Type 2 diabetes mellitus without complications: Secondary | ICD-10-CM | POA: Diagnosis not present

## 2020-09-16 DIAGNOSIS — Z961 Presence of intraocular lens: Secondary | ICD-10-CM | POA: Diagnosis not present

## 2020-09-16 DIAGNOSIS — H04551 Acquired stenosis of right nasolacrimal duct: Secondary | ICD-10-CM | POA: Diagnosis not present

## 2020-09-16 LAB — HM DIABETES EYE EXAM

## 2020-09-18 ENCOUNTER — Ambulatory Visit: Payer: Medicare Other | Admitting: Physical Therapy

## 2020-09-18 ENCOUNTER — Other Ambulatory Visit: Payer: Self-pay

## 2020-09-18 ENCOUNTER — Encounter: Payer: Self-pay | Admitting: Physical Therapy

## 2020-09-18 ENCOUNTER — Other Ambulatory Visit: Payer: Self-pay | Admitting: *Deleted

## 2020-09-18 DIAGNOSIS — M6281 Muscle weakness (generalized): Secondary | ICD-10-CM | POA: Diagnosis not present

## 2020-09-18 DIAGNOSIS — R2689 Other abnormalities of gait and mobility: Secondary | ICD-10-CM

## 2020-09-18 DIAGNOSIS — R2681 Unsteadiness on feet: Secondary | ICD-10-CM

## 2020-09-18 NOTE — Patient Outreach (Signed)
[facility]) Care Management  14/48/1856  [name] [birth year] [phone]   RN Health Coach telephone call to patient.  [employer] compliance verified. Per patient she was wanting the booster shot. She would like to have it done homebound. We have two services in [place] that will do homebound vaccines. RN called to make sure that the services are available. Patient went with [name]. They will come to home on Sunday and administer.   Plan: [name] will call patient on Friday to set actual time to administer on Sunday  Ruberta Holck [facility] [facility] Management [phone]

## 2020-09-18 NOTE — Therapy (Signed)
[place], [place], [address] Phone: [phone]   Fax:  [phone]  Physical Therapy Treatment  Patient Details  Name: [name] MRN: [id number] Date of Birth: 1951/05/31 Referring Provider (PT): Lind Covert, MD   Encounter Date: 09/18/2020   PT End of Session - 09/18/20 0920    Visit Number 4    Number of Visits 8    Date for PT Re-Evaluation 10/22/20    Authorization Type UHC medicare -- needs 10th visit progress note    Authorization Time Period FOTO by 6th visit    Progress Note Due on Visit 10    PT Start Time 0915    PT Stop Time 1000    PT Time Calculation (min) 45 min    Activity Tolerance Patient tolerated treatment well    Behavior During Therapy [facility] for tasks assessed/performed           Past Medical History:  Diagnosis Date   Anemia    Arthritis    Asthma    Blood transfusion without reported diagnosis    Patients believes 2015 when she overdosed on "medications"    Breast cancer of lower-outer quadrant of right female breast ([place]) 02/13/2015   Cataract    CHF, acute ([place]) 05/03/2012   Depression    Diabetes mellitus    Eczema    Fatty liver    Fatty infiltration of liver noted on 03/2012 CT scan   Fibromyalgia    Glaucoma    Heart disease    History of kidney stones    w/ hx of hydronephrosis - followed by [facility]   HIV nonspecific serology    2006: indeterminate HIV blood test, seen by ID, felt secondary to cross reacting antibodies with no further workup felt necessary at that time    Hypertension    Obesity    Personal history of radiation therapy 2016   right   Radiation 05/07/15-06/23/15   Right Breast    Past Surgical History:  Procedure Laterality Date   BREAST BIOPSY Left 02/10/2015   malignant    BREAST LUMPECTOMY Right 03/21/2015   CHOLECYSTECTOMY  2003   CYSTOSCOPY W/ LITHOLAPAXY / EHL     JOINT REPLACEMENT      bilateral knee replacement   LEFT AND RIGHT HEART CATHETERIZATION WITH CORONARY ANGIOGRAM N/A 05/02/2012   Procedure: LEFT AND RIGHT HEART CATHETERIZATION WITH CORONARY ANGIOGRAM;  Surgeon: Burnell Blanks, MD;  Location: [facility] CATH LAB;  Service: Cardiovascular;  Laterality: N/A;   RADIOACTIVE SEED GUIDED PARTIAL MASTECTOMY WITH AXILLARY SENTINEL LYMPH NODE BIOPSY Right 03/21/2015   Procedure: RIGHT  PARTIAL MASTECTOMY WITH RADIOACTIVE SEED LOCALIZATION RIGHT  AXILLARY SENTINEL LYMPH NODE BIOPSY;  Surgeon: Fanny Skates, MD;  Location: [place];  Service: General;  Laterality: Right;   REPLACEMENT TOTAL KNEE BILATERAL  2005 &2006   VASCULAR SURGERY Right 03/15/2013   Ultrasound guided sclerotherapy    There were no vitals filed for this visit.   Subjective Assessment - 09/18/20 0918    Subjective Patient reports she is doing well. Exercises at home are going good.    Patient Stated Goals Improve balance and reduce her loss of balance    Currently in Pain? Yes    Pain Score 7     Pain Location Knee    Pain Orientation Right;Left    Pain Descriptors / Indicators Aching    Pain Type Chronic pain    Pain Onset More  than a month ago    Pain Frequency Constant              [facility] PT Assessment - 09/18/20 0001      Standardized Balance Assessment   Five times sit to stand comments  19 seconds                         [facility] Adult PT Treatment/Exercise - 09/18/20 0001      Self-Care   Self-Care Other Self-Care Comments    Other Self-Care Comments  FOTO      Neuro Re-ed    Neuro Re-ed Details  Airex: romberg x 30 sec, romberg with head turns 2 x 30 sec, romberg with head nods 2 x 30 sec; Flat surface: 3/4 tandem 2 x 30 sec each      Exercises   Exercises Knee/Hip      Knee/Hip Exercises: Aerobic   Nustep L3 x 5 min with LE only      Knee/Hip Exercises: Standing   Heel Raises 2 sets;20 reps    Heel Raises Limitations heel-toe with 2#     Knee Flexion 2 sets;10 reps    Knee Flexion Limitations 2#    Hip Flexion 2 sets;10 reps    Hip Flexion Limitations 2#    Hip Abduction 2 sets;10 reps    Abduction Limitations Green    Hip Extension 2 sets;10 reps    Extension Limitations Green      Knee/Hip Exercises: Seated   Long Arc Quad 2 sets;20 reps    Long Arc Quad Weight 2 lbs.   2nd set only   Sit to Sand 2 sets;10 reps;without UE support   table height 21"                 PT Education - 09/18/20 0919    Education Details HEP, FOTO    Person(s) Educated Patient    Methods Explanation;Demonstration;Verbal cues    Comprehension Verbalized understanding;Returned demonstration;Verbal cues required;Need further instruction            PT Short Term Goals - 08/27/20 1223      PT SHORT TERM GOAL #1   Title Pt will be independent with initial HEP    Time 4    Period Weeks    Status New    Target Date 09/24/20      PT SHORT TERM GOAL #2   Title Pt will be able to demonstate 5x STS without UE and no LOB    Baseline LOB after 3 reps    Time 4    Period Weeks    Status New    Target Date 09/24/20      PT SHORT TERM GOAL #3   Title Pt will improve TUG score to at least 12 sec    Time 4    Period Weeks    Status New    Target Date 09/24/20             PT Long Term Goals - 08/27/20 1225      PT LONG TERM GOAL #1   Title Pt will be independent with advanced HEP    Time 8    Period Weeks    Status New    Target Date 10/22/20      PT LONG TERM GOAL #2   Title Pt will improve 5x STS to at least 18 sec or less    Baseline 30 sec  Time 8    Period Weeks    Status New    Target Date 10/22/20      PT LONG TERM GOAL #3   Title Pt will improve Berg Balance to at least 40/56 for decreased fall risk    Baseline 30/56    Time 8    Period Weeks    Status New    Target Date 10/22/20      PT LONG TERM GOAL #4   Title Pt will have improved TUG score to at least 10 sec for reduced fall risk    Time  8    Period Weeks    Status New    Target Date 10/22/20                 Plan - 09/18/20 0920    Clinical Impression Statement Patient tolerated therapy well with no adverse effects. Continued with gross LE strengthening and balance training. She tolerated progressions in exercises well. She continues to have difficulty standing from standard height chair with occasional LOB upon standing, but is able to do well from sightly higher surface. She did require occasional UE support while on Airex. No change to HEP. She would benefit from continued skilled PT to progress strength and balanc eto reduce fall risk.    PT Treatment/Interventions Aquatic Therapy;Electrical Stimulation;Iontophoresis 4mg /ml Dexamethasone;Moist Heat;Cryotherapy;Ultrasound;Gait training;Stair training;Functional mobility training;Therapeutic activities;Therapeutic exercise;Balance training;Neuromuscular re-education;Patient/family education;Manual techniques;Taping;Dry needling;Vasopneumatic Device    PT Next Visit Plan Assess response to HEP. Continue to progress pt's ankle/hip strengthening. Work on balance/weight shifting.    PT Home Exercise Plan Access Code: [email or web address]    [email or web address] and Agree with Plan of Care Patient           Patient will benefit from skilled therapeutic intervention in order to improve the following deficits and impairments:  Decreased balance, Decreased mobility, Difficulty walking, Decreased activity tolerance, Decreased strength  Visit Diagnosis: Unsteadiness on feet  Muscle weakness (generalized)  Other abnormalities of gait and mobility  Gait instability     Problem List Patient Active Problem List   Diagnosis Date Noted   Tinea pedis 04/09/2020   Spinal stenosis of lumbar region without neurogenic claudication 02/08/2019   Nephropathy, diabetic ([place]) 02/07/2019   Gout 05/16/2018   Gait instability 01/03/2018   Frequent bowel movements 11/02/2017   Tremor of both  hands    Stuttering    Essential hypertension    Hyperlipidemia 08/13/2015   Breast cancer of lower-outer quadrant of right female breast ([place]) 02/13/2015   Vitamin D deficiency 02/06/2015   Type 2 diabetes mellitus with diabetic foot deformity ([place]) 01/01/2014   Chronic combined systolic and diastolic heart failure ([place]) 02/26/2013   OBESITY 02/05/2009   Asthma, intermittent 02/08/2008   NEPHROLITHIASIS 01/26/2007   DJD (degenerative joint disease), multiple sites 01/26/2007    Hilda Blades, PT, DPT, LAT, ATC 09/18/20  10:05 AM Phone: [phone] Fax: [facility] [address] [place], [place], [address] Phone: [phone]   Fax:  [phone]  Name: [name] MRN: [id number] Date of Birth: [birth year]

## 2020-09-21 DIAGNOSIS — Z23 Encounter for immunization: Secondary | ICD-10-CM | POA: Diagnosis not present

## 2020-09-25 ENCOUNTER — Other Ambulatory Visit: Payer: Self-pay

## 2020-09-25 ENCOUNTER — Encounter: Payer: Self-pay | Admitting: Physical Therapy

## 2020-09-25 ENCOUNTER — Ambulatory Visit: Payer: Medicare Other | Admitting: Physical Therapy

## 2020-09-25 DIAGNOSIS — M6281 Muscle weakness (generalized): Secondary | ICD-10-CM | POA: Diagnosis not present

## 2020-09-25 DIAGNOSIS — R2681 Unsteadiness on feet: Secondary | ICD-10-CM

## 2020-09-25 DIAGNOSIS — R2689 Other abnormalities of gait and mobility: Secondary | ICD-10-CM | POA: Diagnosis not present

## 2020-09-25 NOTE — Therapy (Signed)
[place], [place], [address] Phone: [phone]   Fax:  [phone]  Physical Therapy Treatment  Patient Details  Name: [name] MRN: [id number] Date of Birth: Oct 24, 1951 Referring Provider (PT): Lind Covert, MD   Encounter Date: 09/25/2020   PT End of Session - 09/25/20 0919    Visit Number 5    Number of Visits 8    Date for PT Re-Evaluation 10/22/20    Authorization Type UHC medicare    Authorization Time Period FOTO by 6th visit    Progress Note Due on Visit 10    PT Start Time 0904    PT Stop Time 0945    PT Time Calculation (min) 41 min    Activity Tolerance Patient tolerated treatment well    Behavior During Therapy [facility] for tasks assessed/performed           Past Medical History:  Diagnosis Date  . Anemia   . Arthritis   . Asthma   . Blood transfusion without reported diagnosis    Patients believes 2015 when she overdosed on "medications"   . Breast cancer of lower-outer quadrant of right female breast ([place]) 02/13/2015  . Cataract   . CHF, acute ([place]) 05/03/2012  . Depression   . Diabetes mellitus   . Eczema   . Fatty liver    Fatty infiltration of liver noted on 03/2012 CT scan  . Fibromyalgia   . Glaucoma   . Heart disease   . History of kidney stones    w/ hx of hydronephrosis - followed by [facility]  . HIV nonspecific serology    2006: indeterminate HIV blood test, seen by ID, felt secondary to cross reacting antibodies with no further workup felt necessary at that time   . Hypertension   . Obesity   . Personal history of radiation therapy 2016   right  . Radiation 05/07/15-06/23/15   Right Breast    Past Surgical History:  Procedure Laterality Date  . BREAST BIOPSY Left 02/10/2015   malignant   . BREAST LUMPECTOMY Right 03/21/2015  . CHOLECYSTECTOMY  2003  . CYSTOSCOPY W/ LITHOLAPAXY / EHL    . JOINT REPLACEMENT     bilateral knee replacement  .  LEFT AND RIGHT HEART CATHETERIZATION WITH CORONARY ANGIOGRAM N/A 05/02/2012   Procedure: LEFT AND RIGHT HEART CATHETERIZATION WITH CORONARY ANGIOGRAM;  Surgeon: Burnell Blanks, MD;  Location[facility] CATH LAB;  Service: Cardiovascular;  Laterality: N/A;  . RADIOACTIVE SEED GUIDED PARTIAL MASTECTOMY WITH AXILLARY SENTINEL LYMPH NODE BIOPSY Right 03/21/2015   Procedure: RIGHT  PARTIAL MASTECTOMY WITH RADIOACTIVE SEED LOCALIZATION RIGHT  AXILLARY SENTINEL LYMPH NODE BIOPSY;  Surgeon: Fanny Skates, MD;  Location: [place];  Service: General;  Laterality: Right;  . REPLACEMENT TOTAL KNEE BILATERAL  2005 &2006  . VASCULAR SURGERY Right 03/15/2013   Ultrasound guided sclerotherapy    There were no vitals filed for this visit.   Subjective Assessment - 09/25/20 0916    Subjective Patient reports she is doing about the same. Left knee has been bothering more and also the bottom of her heels where he has spurs. She is consistent with exercises at home.    Patient Stated Goals Improve balance and reduce her loss of balance    Currently in Pain? Yes    Pain Score 7     Pain Location Knee    Pain Orientation Right;Left    Pain Descriptors / Indicators  Aching    Pain Type Chronic pain    Pain Onset More than a month ago    Pain Frequency Constant              [facility] PT Assessment - 09/25/20 0001      Assessment   Medical Diagnosis Gait instability    Referring Provider (PT) Lind Covert, MD      Restrictions   Weight Bearing Restrictions No      Balance Screen   Has the patient fallen in the past 6 months No    Has the patient had a decrease in activity level because of a fear of falling?  No    Is the patient reluctant to leave their home because of a fear of falling?  No      Timed Up and Go Test   Normal TUG (seconds) 13.1                         [facility] Adult PT Treatment/Exercise - 09/25/20 0001      Neuro Re-ed    Neuro Re-ed Details   Airex: romberg x 30 sec, romberg with head turns 2 x 30 sec, romberg with head nods 2 x 30 sec; Flat surface: 3/4 tandem 2 x 30 sec each      Exercises   Exercises Knee/Hip      Knee/Hip Exercises: Aerobic   Nustep L5 x 6 min with LE only for endurance      Knee/Hip Exercises: Standing   Heel Raises 2 sets;20 reps    Heel Raises Limitations heel-toe with 2#    Knee Flexion 2 sets;10 reps    Knee Flexion Limitations 2#    Hip Flexion 2 sets;10 reps    Hip Flexion Limitations 2#    Hip Abduction 2 sets;10 reps    Abduction Limitations 2#    Hip Extension 2 sets;10 reps    Extension Limitations 2#      Knee/Hip Exercises: Seated   Long Arc Quad 2 sets;20 reps    Long Arc Quad Weight 2 lbs.    Sit to Sand 2 sets;10 reps;without UE support   table height 21"                 PT Education - 09/25/20 0919    Education Details HEP update, walking more frequently    Person(s) Educated Patient    Methods Explanation    Comprehension Verbalized understanding;Need further instruction            PT Short Term Goals - 09/25/20 0952      PT SHORT TERM GOAL #1   Title Pt will be independent with initial HEP    Time 4    Period Weeks    Status Achieved    Target Date 09/24/20      PT SHORT TERM GOAL #2   Title Pt will be able to demonstate 5x STS without UE and no LOB    Baseline Patient requires occasional UE support with standard chair    Time 4    Period Weeks    Status On-going    Target Date 09/24/20      PT SHORT TERM GOAL #3   Title Pt will improve TUG score to at least 12 sec    Baseline 13.1    Time 4    Period Weeks    Status On-going    Target Date 09/24/20  PT Long Term Goals - 08/27/20 1225      PT LONG TERM GOAL #1   Title Pt will be independent with advanced HEP    Time 8    Period Weeks    Status New    Target Date 10/22/20      PT LONG TERM GOAL #2   Title Pt will improve 5x STS to at least 18 sec or less    Baseline 30  sec    Time 8    Period Weeks    Status New    Target Date 10/22/20      PT LONG TERM GOAL #3   Title Pt will improve Berg Balance to at least 40/56 for decreased fall risk    Baseline 30/56    Time 8    Period Weeks    Status New    Target Date 10/22/20      PT LONG TERM GOAL #4   Title Pt will have improved TUG score to at least 10 sec for reduced fall risk    Time 8    Period Weeks    Status New    Target Date 10/22/20                 Plan - 09/25/20 0175    Clinical Impression Statement Patient tolerated therapy well with no adverse effects. Continued focus on strengthening and balance training. Patient is progressing well and requires less UE support for sit<>stand and with balance exercises. Updated HEP this visit. She would benefit from continued skilled PT to progress strength and balanc eto reduce fall risk.    PT Treatment/Interventions Aquatic Therapy;Electrical Stimulation;Iontophoresis 4mg /ml Dexamethasone;Moist Heat;Cryotherapy;Ultrasound;Gait training;Stair training;Functional mobility training;Therapeutic activities;Therapeutic exercise;Balance training;Neuromuscular re-education;Patient/family education;Manual techniques;Taping;Dry needling;Vasopneumatic Device    PT Next Visit Plan Reassess FOTO. Assess HEPand progress PRN. Continue to progress LE strength. Balance training. Gait training    PT Home Exercise Plan [email or web address]    [id number] and Agree with Plan of Care Patient           Patient will benefit from skilled therapeutic intervention in order to improve the following deficits and impairments:  Decreased balance, Decreased mobility, Difficulty walking, Decreased activity tolerance, Decreased strength  Visit Diagnosis: Unsteadiness on feet  Muscle weakness (generalized)  Other abnormalities of gait and mobility  Gait instability     Problem List Patient Active Problem List   Diagnosis Date Noted  . Tinea pedis 04/09/2020  . Spinal  stenosis of lumbar region without neurogenic claudication 02/08/2019  . Nephropathy, diabetic ([place]) 02/07/2019  . Gout 05/16/2018  . Gait instability 01/03/2018  . Frequent bowel movements 11/02/2017  . Tremor of both hands   . Stuttering   . Essential hypertension   . Hyperlipidemia 08/13/2015  . Breast cancer of lower-outer quadrant of right female breast ([place]) 02/13/2015  . Vitamin D deficiency 02/06/2015  . Type 2 diabetes mellitus with diabetic foot deformity ([place]) 01/01/2014  . Chronic combined systolic and diastolic heart failure ([place]) 02/26/2013  . OBESITY 02/05/2009  . Asthma, intermittent 02/08/2008  . NEPHROLITHIASIS 01/26/2007  . DJD (degenerative joint disease), multiple sites 01/26/2007    Hilda Blades, PT, DPT, LAT, ATC 09/25/20  9:57 AM Phone: [phone] Fax: [facility] [address] [place], [place], [address] Phone: [phone]   Fax:  [phone]  Name: [name] MRN: [id number] Date of Birth: 10-Dec-1950

## 2020-09-25 NOTE — Patient Instructions (Signed)
Access Code: [email or web address] URL: [email or web address] Date: 09/25/2020 Prepared by: Hilda Blades  Exercises Seated Long Arc Quad - 1 x daily - 7 x weekly - 3 sets - 10-15 reps Heel Toe Raises with Counter Support - 1 x daily - 7 x weekly - 3 sets - 10-15 reps Standing Hip Abduction with Counter Support - 1 x daily - 7 x weekly - 3 sets - 10-15 reps Standing Hip Extension with Counter Support - 1 x daily - 7 x weekly - 3 sets - 10-15 reps Standing March with Counter Support - 1 x daily - 7 x weekly - 3 sets - 10-15 reps Standing Knee Flexion with Counter Support - 1 x daily - 7 x weekly - 3 sets - 10-15 reps Sit to Stand with Armchair - 1 x daily - 7 x weekly - 3 sets - 10 reps Narrow Stance with Counter Support - 1 x daily - 7 x weekly - 5 reps - 30 seconds hold

## 2020-09-30 ENCOUNTER — Ambulatory Visit (INDEPENDENT_AMBULATORY_CARE_PROVIDER_SITE_OTHER): Payer: Medicare Other | Admitting: Podiatry

## 2020-09-30 ENCOUNTER — Other Ambulatory Visit: Payer: Self-pay

## 2020-09-30 ENCOUNTER — Encounter: Payer: Self-pay | Admitting: Podiatry

## 2020-09-30 DIAGNOSIS — R269 Unspecified abnormalities of gait and mobility: Secondary | ICD-10-CM

## 2020-09-30 DIAGNOSIS — R2689 Other abnormalities of gait and mobility: Secondary | ICD-10-CM

## 2020-09-30 DIAGNOSIS — Z9181 History of falling: Secondary | ICD-10-CM

## 2020-09-30 NOTE — Progress Notes (Signed)
  Subjective:  Patient ID: [name], female    DOB: November 02, 1951,  MRN: [id number]  No chief complaint on file.   69 y.o. female presents with the above complaint. History confirmed with patient. Reports fear of falling but catches herself. She has not had a major fall this yar.  Objective:  Physical Exam: warm, good capillary refill, no trophic changes or ulcerative lesions, normal DP and PT pulses and normal sensory exam.  Balance Assessment:  Timed Up and Go: 15 (>12 seconds indicative of fall risk) 30-Second Chair Stand Test: 4 (terminated due to safety Full Tandem Stance: 0 (terminatd due to safety)    Assessment:   1. At high risk for injury related to fall   2. Gait disturbance   3. Balance disorder      Plan:  Patient was evaluated and treated and all questions answered.  Fall Risk, balance disorder -Balance asssment performed. -Would benefit from Balance bracing -Will refer back to orthotist for casting. -F/u after brace fabrication   No follow-ups on file.

## 2020-10-02 ENCOUNTER — Ambulatory Visit: Payer: Medicare Other | Admitting: Physical Therapy

## 2020-10-06 ENCOUNTER — Ambulatory Visit: Payer: Medicare Other | Admitting: Orthotics

## 2020-10-07 ENCOUNTER — Other Ambulatory Visit: Payer: Self-pay

## 2020-10-07 ENCOUNTER — Ambulatory Visit (INDEPENDENT_AMBULATORY_CARE_PROVIDER_SITE_OTHER): Payer: Medicare Other | Admitting: Orthotics

## 2020-10-07 DIAGNOSIS — M216X2 Other acquired deformities of left foot: Secondary | ICD-10-CM | POA: Diagnosis not present

## 2020-10-07 DIAGNOSIS — R269 Unspecified abnormalities of gait and mobility: Secondary | ICD-10-CM | POA: Diagnosis not present

## 2020-10-07 DIAGNOSIS — E1140 Type 2 diabetes mellitus with diabetic neuropathy, unspecified: Secondary | ICD-10-CM | POA: Diagnosis not present

## 2020-10-07 DIAGNOSIS — M7731 Calcaneal spur, right foot: Secondary | ICD-10-CM

## 2020-10-07 DIAGNOSIS — E11610 Type 2 diabetes mellitus with diabetic neuropathic arthropathy: Secondary | ICD-10-CM

## 2020-10-07 DIAGNOSIS — M216X1 Other acquired deformities of right foot: Secondary | ICD-10-CM | POA: Diagnosis not present

## 2020-10-07 DIAGNOSIS — M7732 Calcaneal spur, left foot: Secondary | ICD-10-CM | POA: Diagnosis not present

## 2020-10-07 NOTE — Progress Notes (Signed)
Picked up dbs and they fit well with no heel slippage and at least 1/4 toe clearance.   She also came to be measured for custom balance braces; however she reports she is more swollen in afternoon and this morning is not a good indication of her nomral swelling.  I advised she reschedule brace casting until we can get appointment more later in day.

## 2020-10-09 ENCOUNTER — Other Ambulatory Visit: Payer: Self-pay

## 2020-10-09 ENCOUNTER — Encounter: Payer: Self-pay | Admitting: Physical Therapy

## 2020-10-09 ENCOUNTER — Ambulatory Visit: Payer: Medicare Other | Attending: Family Medicine | Admitting: Physical Therapy

## 2020-10-09 DIAGNOSIS — R2681 Unsteadiness on feet: Secondary | ICD-10-CM | POA: Diagnosis not present

## 2020-10-09 DIAGNOSIS — G8929 Other chronic pain: Secondary | ICD-10-CM | POA: Insufficient documentation

## 2020-10-09 DIAGNOSIS — M6281 Muscle weakness (generalized): Secondary | ICD-10-CM

## 2020-10-09 DIAGNOSIS — M545 Low back pain, unspecified: Secondary | ICD-10-CM | POA: Insufficient documentation

## 2020-10-09 DIAGNOSIS — R2689 Other abnormalities of gait and mobility: Secondary | ICD-10-CM

## 2020-10-09 NOTE — Patient Instructions (Signed)
Access Code: [email or web address] URL: [email or web address] Date: 10/09/2020 Prepared by: Hilda Blades  Exercises Seated Long Arc Quad - 1 x daily - 7 x weekly - 3 sets - 10-15 reps Heel Toe Raises with Counter Support - 1 x daily - 7 x weekly - 3 sets - 10-15 reps Standing Hip Abduction with Counter Support - 1 x daily - 7 x weekly - 3 sets - 10-15 reps Standing Hip Extension with Counter Support - 1 x daily - 7 x weekly - 3 sets - 10-15 reps Standing March with Counter Support - 1 x daily - 7 x weekly - 3 sets - 10-15 reps Standing Knee Flexion with Counter Support - 1 x daily - 7 x weekly - 3 sets - 10-15 reps Sit to Stand with Armchair - 1 x daily - 7 x weekly - 3 sets - 10 reps Narrow Stance with Counter Support - 1 x daily - 7 x weekly - 5 reps - 30 seconds hold

## 2020-10-09 NOTE — Therapy (Addendum)
[place], [place], [address] Phone: [phone]   Fax:  [phone]  Physical Therapy Treatment / ERO  Progress Note Reporting Period 08/27/2020 to 10/09/2020  See note below for Objective Data and Assessment of Progress/Goals.    Patient Details  Name: [name] MRN: [id number] Date of Birth: [birth year] Referring Provider (PT): Lind Covert, MD   Encounter Date: 10/09/2020   PT End of Session - 10/09/20 0926    Visit Number 6    Number of Visits 12    Date for PT Re-Evaluation 11/20/20    Authorization Type UHC medicare    Authorization Time Period FOTO by 6th visit    Progress Note Due on Visit 10    PT Start Time 0915    PT Stop Time 1000    PT Time Calculation (min) 45 min    Activity Tolerance Patient tolerated treatment well    Behavior During Therapy [facility] for tasks assessed/performed           Past Medical History:  Diagnosis Date  . Anemia   . Arthritis   . Asthma   . Blood transfusion without reported diagnosis    Patients believes 2015 when she overdosed on "medications"   . Breast cancer of lower-outer quadrant of right female breast ([place]) 02/13/2015  . Cataract   . CHF, acute ([place]) 05/03/2012  . Depression   . Diabetes mellitus   . Eczema   . Fatty liver    Fatty infiltration of liver noted on 03/2012 CT scan  . Fibromyalgia   . Glaucoma   . Heart disease   . History of kidney stones    w/ hx of hydronephrosis - followed by [facility]  . HIV nonspecific serology    2006: indeterminate HIV blood test, seen by ID, felt secondary to cross reacting antibodies with no further workup felt necessary at that time   . Hypertension   . Obesity   . Personal history of radiation therapy 2016   right  . Radiation 05/07/15-06/23/15   Right Breast    Past Surgical History:  Procedure Laterality Date  . BREAST BIOPSY Left 02/10/2015   malignant   . BREAST  LUMPECTOMY Right 03/21/2015  . CHOLECYSTECTOMY  2003  . CYSTOSCOPY W/ LITHOLAPAXY / EHL    . JOINT REPLACEMENT     bilateral knee replacement  . LEFT AND RIGHT HEART CATHETERIZATION WITH CORONARY ANGIOGRAM N/A 05/02/2012   Procedure: LEFT AND RIGHT HEART CATHETERIZATION WITH CORONARY ANGIOGRAM;  Surgeon: Burnell Blanks, MD;  Location: [facility] CATH LAB;  Service: Cardiovascular;  Laterality: N/A;  . RADIOACTIVE SEED GUIDED PARTIAL MASTECTOMY WITH AXILLARY SENTINEL LYMPH NODE BIOPSY Right 03/21/2015   Procedure: RIGHT  PARTIAL MASTECTOMY WITH RADIOACTIVE SEED LOCALIZATION RIGHT  AXILLARY SENTINEL LYMPH NODE BIOPSY;  Surgeon: Fanny Skates, MD;  Location: [place];  Service: General;  Laterality: Right;  . REPLACEMENT TOTAL KNEE BILATERAL  2005 &2006  . VASCULAR SURGERY Right 03/15/2013   Ultrasound guided sclerotherapy    There were no vitals filed for this visit.   Subjective Assessment - 10/09/20 1253    Subjective Patient reports she is doing well, no new issues. She reports consistency with exercises and that she saw her podiatrist who wants to do "balance bracing."    Limitations Walking    Patient Stated Goals Improve balance and reduce her loss of balance    Currently in Pain? Yes    Pain  Score 5     Pain Location Knee    Pain Orientation Right;Left    Pain Descriptors / Indicators Aching    Pain Type Chronic pain    Pain Onset More than a month ago    Pain Frequency Constant              [facility] PT Assessment - 10/09/20 0001      Assessment   Medical Diagnosis Gait instability    Referring Provider (PT) Lind Covert, MD    Next MD Visit 10/15/2020      Precautions   Precautions Fall    Precaution Comments Patient unsteady with gait and uses rollator for all mobility      Restrictions   Weight Bearing Restrictions No      Balance Screen   Has the patient fallen in the past 6 months No    Has the patient had a decrease in activity level  because of a fear of falling?  Yes    Is the patient reluctant to leave their home because of a fear of falling?  Yes      Prior Function   Level of Independence Independent with household mobility with device;Independent with community mobility with device      Cognition   Overall Cognitive Status Within Functional Limits for tasks assessed      Observation/Other Assessments   Observations Patient appears in no apparent distress    Focus on Therapeutic Outcomes (FOTO)  36% functional status      Transfers   Five time sit to stand comments  Able to perform without UE assist this visit      6 minute walk test results    Aerobic Endurance Distance Walked 1015    Endurance additional comments no rest breaks required      Standardized Balance Assessment   Five times sit to stand comments  17.8 seconds      Berg Balance Test   Sit to Stand Able to stand  independently using hands    Standing Unsupported Able to stand 2 minutes with supervision    Sitting with Back Unsupported but Feet Supported on Floor or Stool Able to sit safely and securely 2 minutes    Stand to Sit Sits safely with minimal use of hands    Transfers Able to transfer safely, definite need of hands    Standing Unsupported with Eyes Closed Able to stand 10 seconds with supervision    Standing Unsupported with Feet Together Able to place feet together independently and stand for 1 minute with supervision    From Standing, Reach Forward with Outstretched Arm Can reach forward >5 cm safely (2")    From Standing Position, Pick up Object from Floor Able to pick up shoe, needs supervision    From Standing Position, Turn to Look Behind Over each Shoulder Turn sideways only but maintains balance    Turn 360 Degrees Able to turn 360 degrees safely but slowly    Standing Unsupported, Alternately Place Feet on Step/Stool Able to complete >2 steps/needs minimal assist    Standing Unsupported, One Foot in [employer] balance while  stepping or standing    Standing on One Leg Unable to try or needs assist to prevent fall    Total Score 33      Timed Up and Go Test   Normal TUG (seconds) 12.94  [place] Adult PT Treatment/Exercise - 10/09/20 0001      Self-Care   Self-Care Other Self-Care Comments    Other Self-Care Comments  FOTO, reassessment results      Exercises   Exercises Knee/Hip   reviewed patient's current HEP program      Knee/Hip Exercises: Aerobic   Nustep L5 x 6 min with LE only for endurance                  PT Education - 10/09/20 1044    Education Details HEP, progression with functional assessments, walking endurance, continued need to for rollator with mobility    Person(s) Educated Patient    Methods Explanation    Comprehension Verbalized understanding;Need further instruction            PT Short Term Goals - 10/09/20 1057      PT SHORT TERM GOAL #1   Title Pt will be independent with initial HEP    Time 4    Period Weeks    Status Achieved    Target Date 09/24/20      PT SHORT TERM GOAL #2   Title Pt will be able to demonstate 5x STS without UE and no LOB    Baseline Patient was able to perform 5xSTS without UE assist this visit    Time 4    Period Weeks    Status Achieved    Target Date 09/24/20      PT SHORT TERM GOAL #3   Title Pt will improve TUG score to at least 12 sec    Baseline 12.94    Time 3    Period Weeks    Status On-going    Target Date 10/30/20             PT Long Term Goals - 10/09/20 1058      PT LONG TERM GOAL #1   Title Pt will be independent with advanced HEP    Time 6    Period Weeks    Status On-going    Target Date 11/20/20      PT LONG TERM GOAL #2   Title Pt will improve 5x STS to </= 12 seconds to reduce fall risk    Baseline 17.8 seconds    Time 6    Period Weeks    Status Revised    Target Date 11/20/20      PT LONG TERM GOAL #3   Title Pt will improve Berg Balance to at  least 40/56 for decreased fall risk    Baseline 33/56    Time 6    Period Weeks    Status On-going    Target Date 11/20/20      PT LONG TERM GOAL #4   Title Patient will exhibit improved walking endurance by improvement of 300 ft with 6MWT    Baseline 1015 ft    Time 6    Period Weeks    Status New    Target Date 11/20/20                 Plan - 10/09/20 1047    Clinical Impression Statement Patient tolerated therapy well with no adverse effects. Patient does exhibit improvement in her functional assessment for fall risk including 5xSTS, TUG, and BERG, but she did report a reduction in functional ability on FOTO. She continues to have trouble with transfers and exhibits balance deficits without use of an AD.  She would benefit from continued skilled PT  to progress strength and balance to reduce fall risk and improve walking ability.    PT Frequency 1x / week    PT Duration 6 weeks    PT Treatment/Interventions Aquatic Therapy;Electrical Stimulation;Iontophoresis 4mg /ml Dexamethasone;Moist Heat;Cryotherapy;Ultrasound;Gait training;Stair training;Functional mobility training;Therapeutic activities;Therapeutic exercise;Balance training;Neuromuscular re-education;Patient/family education;Manual techniques;Taping;Dry needling;Vasopneumatic Device    PT Next Visit Plan Review HEP and progress PRN. Continue to progress LE strength. Balance training. Gait training    PT Home Exercise Plan [email or web address]    [id number] and Agree with Plan of Care Patient           Patient will benefit from skilled therapeutic intervention in order to improve the following deficits and impairments:  Decreased balance, Decreased mobility, Difficulty walking, Decreased activity tolerance, Decreased strength  Visit Diagnosis: Unsteadiness on feet  Muscle weakness (generalized)  Other abnormalities of gait and mobility  Gait instability     Problem List Patient Active Problem List   Diagnosis Date Noted   . Tinea pedis 04/09/2020  . Spinal stenosis of lumbar region without neurogenic claudication 02/08/2019  . Nephropathy, diabetic ([place]) 02/07/2019  . Gout 05/16/2018  . Gait instability 01/03/2018  . Frequent bowel movements 11/02/2017  . Tremor of both hands   . Stuttering   . Essential hypertension   . Hyperlipidemia 08/13/2015  . Breast cancer of lower-outer quadrant of right female breast ([place]) 02/13/2015  . Vitamin D deficiency 02/06/2015  . Type 2 diabetes mellitus with diabetic foot deformity ([place]) 01/01/2014  . Chronic combined systolic and diastolic heart failure ([place]) 02/26/2013  . OBESITY 02/05/2009  . Asthma, intermittent 02/08/2008  . NEPHROLITHIASIS 01/26/2007  . DJD (degenerative joint disease), multiple sites 01/26/2007    Hilda Blades, PT, DPT, LAT, ATC 10/09/20  12:58 PM Phone: [phone] Fax: [place] [facility] [address] [place], [place], [address] Phone: [phone]   Fax:  [phone]  Name: EMMANUELA GHAZI MRN: [id number] Date of Birth: 02-14-1951

## 2020-10-15 ENCOUNTER — Ambulatory Visit (INDEPENDENT_AMBULATORY_CARE_PROVIDER_SITE_OTHER): Payer: Medicare Other | Admitting: Podiatry

## 2020-10-15 ENCOUNTER — Other Ambulatory Visit: Payer: Self-pay

## 2020-10-15 ENCOUNTER — Encounter: Payer: Self-pay | Admitting: Podiatry

## 2020-10-15 DIAGNOSIS — B351 Tinea unguium: Secondary | ICD-10-CM

## 2020-10-15 DIAGNOSIS — E1140 Type 2 diabetes mellitus with diabetic neuropathy, unspecified: Secondary | ICD-10-CM | POA: Diagnosis not present

## 2020-10-15 NOTE — Progress Notes (Signed)
This patient returns to my office for at risk foot care.  This patient requires this care by a professional since this patient will be at risk due to having diabetes.   This patient is unable to cut nails herself since the patient cannot reach her nails.These nails are painful walking and wearing shoes.  This patient presents for at risk foot care today.  General Appearance  Alert, conversant and in no acute stress.  Vascular  Dorsalis pedis and posterior tibial  pulses are palpable  bilaterally.  Capillary return is within normal limits  bilaterally. Temperature is within normal limits  bilaterally.  Neurologic  Senn-Weinstein monofilament wire test absent   bilaterally. Muscle power within normal limits bilaterally.  Nails Thick disfigured discolored nails with subungual debris  from hallux to fifth toes bilaterally. No evidence of bacterial infection or drainage bilaterally.  Orthopedic  No limitations of motion  feet .  No crepitus or effusions noted.  No bony pathology or digital deformities noted. Asymptomatic  HAV  B/L.  Pes planus  B/L.  Skin  normotropic skin with no porokeratosis noted bilaterally.  No signs of infections or ulcers noted.   White mascerates skin interdigitally digits  Right foot.  Onychomycosis  Pain in right toes  Pain in left toes  Tinea pedis  Right foot.  Prescribe lotrimin-af  2 % aerosol spray.  Consent was obtained for treatment procedures.   Mechanical debridement of nails 1-5  bilaterally performed with a nail nipper.  Filed with dremel without incident.    Return office visit  3 months                    Told patient to return for periodic foot care and evaluation due to potential at risk complications. Patient says she has heel pain.  Refer to doctors in office.   Gardiner Barefoot DPM

## 2020-10-16 ENCOUNTER — Encounter: Payer: Medicare Other | Admitting: Physical Therapy

## 2020-10-16 DIAGNOSIS — N3020 Other chronic cystitis without hematuria: Secondary | ICD-10-CM | POA: Diagnosis not present

## 2020-10-21 ENCOUNTER — Other Ambulatory Visit: Payer: Medicare Other | Admitting: Orthotics

## 2020-10-23 ENCOUNTER — Other Ambulatory Visit: Payer: Self-pay | Admitting: Family Medicine

## 2020-10-25 ENCOUNTER — Other Ambulatory Visit: Payer: Self-pay | Admitting: Family Medicine

## 2020-10-28 ENCOUNTER — Encounter: Payer: Self-pay | Admitting: Physical Therapy

## 2020-10-28 ENCOUNTER — Ambulatory Visit: Payer: Medicare Other | Admitting: Physical Therapy

## 2020-10-28 ENCOUNTER — Other Ambulatory Visit: Payer: Self-pay

## 2020-10-28 DIAGNOSIS — M545 Low back pain, unspecified: Secondary | ICD-10-CM

## 2020-10-28 DIAGNOSIS — R2681 Unsteadiness on feet: Secondary | ICD-10-CM

## 2020-10-28 DIAGNOSIS — G8929 Other chronic pain: Secondary | ICD-10-CM | POA: Diagnosis not present

## 2020-10-28 DIAGNOSIS — R2689 Other abnormalities of gait and mobility: Secondary | ICD-10-CM

## 2020-10-28 DIAGNOSIS — M6281 Muscle weakness (generalized): Secondary | ICD-10-CM | POA: Diagnosis not present

## 2020-10-28 NOTE — Therapy (Signed)
[place], [place], [address] Phone: [phone]   Fax:  [phone]  Physical Therapy Treatment  Patient Details  Name: [name] MRN: [id number] Date of Birth: 1951/11/20 Referring Provider (PT): Lind Covert, MD   Encounter Date: 10/28/2020   PT End of Session - 10/28/20 1334    Visit Number 7    Number of Visits 12    Date for PT Re-Evaluation 11/20/20    Authorization Type UHC medicare    Authorization Time Period FOTO by 6th visit, 10 th visit    Progress Note Due on Visit 10    PT Start Time 1315    PT Stop Time 1406    PT Time Calculation (min) 51 min           Past Medical History:  Diagnosis Date  . Anemia   . Arthritis   . Asthma   . Blood transfusion without reported diagnosis    Patients believes 2015 when she overdosed on "medications"   . Breast cancer of lower-outer quadrant of right female breast ([place]) 02/13/2015  . Cataract   . CHF, acute ([place]) 05/03/2012  . Depression   . Diabetes mellitus   . Eczema   . Fatty liver    Fatty infiltration of liver noted on 03/2012 CT scan  . Fibromyalgia   . Glaucoma   . Heart disease   . History of kidney stones    w/ hx of hydronephrosis - followed by [facility]  . HIV nonspecific serology    2006: indeterminate HIV blood test, seen by ID, felt secondary to cross reacting antibodies with no further workup felt necessary at that time   . Hypertension   . Obesity   . Personal history of radiation therapy 2016   right  . Radiation 05/07/15-06/23/15   Right Breast    Past Surgical History:  Procedure Laterality Date  . BREAST BIOPSY Left 02/10/2015   malignant   . BREAST LUMPECTOMY Right 03/21/2015  . CHOLECYSTECTOMY  2003  . CYSTOSCOPY W/ LITHOLAPAXY / EHL    . JOINT REPLACEMENT     bilateral knee replacement  . LEFT AND RIGHT HEART CATHETERIZATION WITH CORONARY ANGIOGRAM N/A 05/02/2012   Procedure: LEFT AND RIGHT  HEART CATHETERIZATION WITH CORONARY ANGIOGRAM;  Surgeon: Burnell Blanks, MD;  Location: [facility] CATH LAB;  Service: Cardiovascular;  Laterality: N/A;  . RADIOACTIVE SEED GUIDED PARTIAL MASTECTOMY WITH AXILLARY SENTINEL LYMPH NODE BIOPSY Right 03/21/2015   Procedure: RIGHT  PARTIAL MASTECTOMY WITH RADIOACTIVE SEED LOCALIZATION RIGHT  AXILLARY SENTINEL LYMPH NODE BIOPSY;  Surgeon: Fanny Skates, MD;  Location: [facility];  Service: General;  Laterality: Right;  . REPLACEMENT TOTAL KNEE BILATERAL  2005 &2006  . VASCULAR SURGERY Right 03/15/2013   Ultrasound guided sclerotherapy    There were no vitals filed for this visit.   Subjective Assessment - 10/28/20 1318    Subjective Pt reports heel pain most bothersome, otherwise just her regular arthritis pains. She will see another MD at podiatry office regarding her heel pains.    Currently in Pain? Yes    Pain Score 9     Pain Location Heel    Pain Orientation Right;Left    Pain Descriptors / Indicators Aching    Pain Type Chronic pain    Aggravating Factors  just started 3 days ago    Pain Relieving Factors being easy on the heels, not bearing alot of weight on heels  [place] Adult PT Treatment/Exercise - 10/28/20 0001      Self-Care   Other Self-Care Comments  self rolling to plantar fascia in sitting using roller-tennis ball vs roller, use of ice to reduce inflammation       Knee/Hip Exercises: Stretches   Gastroc Stretch Limitations seated with strap    Soleus Stretch Limitations seated with strap       Knee/Hip Exercises: Aerobic   Nustep L5 x 7 min with LE only for endurance      Knee/Hip Exercises: Seated   Long Arc Quad 2 sets;20 reps    Long Arc Quad Weight 2 lbs.    Marching 20 reps    Marching Limitations 2#    Sit to [employer] 1 set;10 reps   significant heel pain reported      Modalities   Modalities Cryotherapy      Cryotherapy   Cryotherapy Location --    heels in sitting   Type of Cryotherapy Ice pack                    PT Short Term Goals - 10/09/20 1057      PT SHORT TERM GOAL #1   Title Pt will be independent with initial HEP    Time 4    Period Weeks    Status Achieved    Target Date 09/24/20      PT SHORT TERM GOAL #2   Title Pt will be able to demonstate 5x STS without UE and no LOB    Baseline Patient was able to perform 5xSTS without UE assist this visit    Time 4    Period Weeks    Status Achieved    Target Date 09/24/20      PT SHORT TERM GOAL #3   Title Pt will improve TUG score to at least 12 sec    Baseline 12.94    Time 3    Period Weeks    Status On-going    Target Date 10/30/20             PT Long Term Goals - 10/09/20 1058      PT LONG TERM GOAL #1   Title Pt will be independent with advanced HEP    Time 6    Period Weeks    Status On-going    Target Date 11/20/20      PT LONG TERM GOAL #2   Title Pt will improve 5x STS to </= 12 seconds to reduce fall risk    Baseline 17.8 seconds    Time 6    Period Weeks    Status Revised    Target Date 11/20/20      PT LONG TERM GOAL #3   Title Pt will improve Berg Balance to at least 40/56 for decreased fall risk    Baseline 33/56    Time 6    Period Weeks    Status On-going    Target Date 11/20/20      PT LONG TERM GOAL #4   Title Patient will exhibit improved walking endurance by improvement of 300 ft with 6MWT    Baseline 1015 ft    Time 6    Period Weeks    Status New    Target Date 11/20/20                 Plan - 10/28/20 1400    Clinical Impression Statement Pt arrives today reporting 9/10 pain in bilateral  heels limiting weight bearing tolerance. Attmpted seated and standing exercise with pt becoming tearful with pressure to heel. Instructed pt in seated gastroc and soleus stretching as well as options to perfrom self massage to plantar surface of foot.    PT Next Visit Plan Review HEP and progress PRN. Continue to  progress LE strength. Balance training. Gait training    PT [place]           Patient will benefit from skilled therapeutic intervention in order to improve the following deficits and impairments:  Decreased balance, Decreased mobility, Difficulty walking, Decreased activity tolerance, Decreased strength  Visit Diagnosis: Unsteadiness on feet  Muscle weakness (generalized)  Other abnormalities of gait and mobility  Gait instability  Chronic midline low back pain, unspecified whether sciatica present     Problem List Patient Active Problem List   Diagnosis Date Noted  . Tinea pedis 04/09/2020  . Spinal stenosis of lumbar region without neurogenic claudication 02/08/2019  . Nephropathy, diabetic ([place]) 02/07/2019  . Gout 05/16/2018  . Gait instability 01/03/2018  . Frequent bowel movements 11/02/2017  . Tremor of both hands   . Stuttering   . Essential hypertension   . Hyperlipidemia 08/13/2015  . Breast cancer of lower-outer quadrant of right female breast ([place]) 02/13/2015  . Vitamin D deficiency 02/06/2015  . Type 2 diabetes mellitus with diabetic foot deformity ([place]) 01/01/2014  . Chronic combined systolic and diastolic heart failure ([place]) 02/26/2013  . OBESITY 02/05/2009  . Asthma, intermittent 02/08/2008  . NEPHROLITHIASIS 01/26/2007  . DJD (degenerative joint disease), multiple sites 01/26/2007    Dorene Ar , PTA 10/28/2020, 2:08 PM  [facility] [address] [place], [place], [address] Phone: [phone]   Fax:  [phone]  Name: YUETTE PUTNAM MRN: [id number] Date of Birth: [birth year]

## 2020-10-29 ENCOUNTER — Other Ambulatory Visit: Payer: Self-pay

## 2020-10-29 ENCOUNTER — Encounter: Payer: Self-pay | Admitting: Family Medicine

## 2020-10-29 ENCOUNTER — Ambulatory Visit (INDEPENDENT_AMBULATORY_CARE_PROVIDER_SITE_OTHER): Payer: Medicare Other | Admitting: Family Medicine

## 2020-10-29 DIAGNOSIS — E1169 Type 2 diabetes mellitus with other specified complication: Secondary | ICD-10-CM

## 2020-10-29 DIAGNOSIS — M21969 Unspecified acquired deformity of unspecified lower leg: Secondary | ICD-10-CM | POA: Diagnosis not present

## 2020-10-29 DIAGNOSIS — I10 Essential (primary) hypertension: Secondary | ICD-10-CM

## 2020-10-29 DIAGNOSIS — M1000 Idiopathic gout, unspecified site: Secondary | ICD-10-CM | POA: Diagnosis not present

## 2020-10-29 LAB — POCT GLYCOSYLATED HEMOGLOBIN (HGB A1C): Hemoglobin A1C: 8.3 % — AB (ref 4.0–5.6)

## 2020-10-29 MED ORDER — EMPAGLIFLOZIN 25 MG PO TABS: 25.0000 mg | ORAL_TABLET | Freq: Every day | ORAL | 3 refills | Status: DC

## 2020-10-29 NOTE — Assessment & Plan Note (Signed)
Not at goal.  Will increase Jardiance to 25 mg daily

## 2020-10-29 NOTE — Patient Instructions (Addendum)
  Good to see you today  Take 2 tablets of empagliflozin daily until your 25 mg arrives then take that   Come back in 3 months

## 2020-10-29 NOTE — Progress Notes (Signed)
    SUBJECTIVE:   CHIEF COMPLAINT / HPI:   Over all except for her chronic DJD pain feels well   GAIT INSTABILITY Going to Physical Therapy regularly.  No recent falls  HYPERTENSION No lightheadness or chest pain   DIABETES Her blood sugar fastngs are all in upper 100s to low 200s  CHOLESTEROL  Taking atorvastatin regularly - brought in her medications   PERTINENT  PMH / PSH: Birthday in March   OBJECTIVE:   BP 112/75   Pulse 75   Ht 5\' 2"  (1.575 m)   Wt 217 lb 9.6 oz (98.7 kg)   SpO2 99%   BMI 39.80 kg/m   Heart - Regular rate and rhythm.  No murmurs, gallops or rubs.    Lungs:  Normal respiratory effort, chest expands symmetrically. Lungs are clear to auscultation, no crackles or wheezes. Extrem - 1+ edema wearing diabetes shoes   ASSESSMENT/PLAN:   Essential hypertension BP Readings from Last 3 Encounters:  10/29/20 112/75  08/05/20 110/72  05/06/20 118/72   Well controlled continue current medications   Type 2 diabetes mellitus with diabetic foot deformity Not at goal.  Will increase Jardiance to 25 mg daily   Gout Last time when weaned off colchicine had gout flare so will continue low daily dose.  Should check UA level next blood draw     Lind Covert, [place]

## 2020-10-29 NOTE — Assessment & Plan Note (Signed)
BP Readings from Last 3 Encounters:  10/29/20 112/75  08/05/20 110/72  05/06/20 118/72   Well controlled continue current medications

## 2020-10-29 NOTE — Assessment & Plan Note (Addendum)
Last time when weaned off colchicine had gout flare so will continue low daily dose.  Should check UA level next blood draw

## 2020-10-31 ENCOUNTER — Ambulatory Visit (INDEPENDENT_AMBULATORY_CARE_PROVIDER_SITE_OTHER): Payer: Medicare Other | Admitting: Podiatry

## 2020-10-31 ENCOUNTER — Ambulatory Visit (INDEPENDENT_AMBULATORY_CARE_PROVIDER_SITE_OTHER): Payer: Medicare Other

## 2020-10-31 ENCOUNTER — Other Ambulatory Visit: Payer: Self-pay

## 2020-10-31 ENCOUNTER — Other Ambulatory Visit: Payer: Self-pay | Admitting: Podiatry

## 2020-10-31 DIAGNOSIS — M7731 Calcaneal spur, right foot: Secondary | ICD-10-CM | POA: Diagnosis not present

## 2020-10-31 DIAGNOSIS — M7732 Calcaneal spur, left foot: Secondary | ICD-10-CM

## 2020-10-31 DIAGNOSIS — M216X9 Other acquired deformities of unspecified foot: Secondary | ICD-10-CM | POA: Diagnosis not present

## 2020-10-31 DIAGNOSIS — M722 Plantar fascial fibromatosis: Secondary | ICD-10-CM | POA: Diagnosis not present

## 2020-10-31 DIAGNOSIS — M79671 Pain in right foot: Secondary | ICD-10-CM

## 2020-10-31 DIAGNOSIS — M79672 Pain in left foot: Secondary | ICD-10-CM

## 2020-10-31 MED ORDER — BETAMETHASONE SOD PHOS & ACET 6 (3-3) MG/ML IJ SUSP
12.0000 mg | Freq: Once | INTRAMUSCULAR | Status: AC
  Administered 2020-10-31: 12 mg

## 2020-10-31 NOTE — Progress Notes (Signed)
  Subjective:  Patient ID: [name], female    DOB: 1951/02/23,  MRN: [id number]  Chief Complaint  Patient presents with  . Foot Pain    Bilateral heel pain 20 year duration, no known injuries to heels but pt does admit to twisting her right ankle 20 years ago.    69 y.o. female presents with the above complaint. History confirmed with patient.   Objective:  Physical Exam: warm, good capillary refill, no trophic changes or ulcerative lesions, normal DP and PT pulses and normal sensory exam. Severe pes planus Left Foot: tenderness to palpation medial calcaneal tuber, no pain with calcaneal squeeze, decreased ankle joint ROM and +Silverskiold test Right Foot: tenderness to palpation medial calcaneal tuber, no pain with calcaneal squeeze, decreased ankle joint ROM and +Silverskiold test   Radiographs: X-ray of both feet: no evidence of calcaneal stress fracture, plantar calcaneal spur, posterior calcaneal spur, Haglund deformity noted, pes planus and forefoot abduction  Assessment:   1. Plantar fasciitis   2. Heel spur, left   3. Calcaneal spur of right foot   4. Equinus deformity of foot      Plan:  Patient was evaluated and treated and all questions answered.  Plantar Fasciitis -XR reviewed with patient -Educated patient on stretching and icing of the affected limb -Injection delivered to the plantar fascia of both feet.  Procedure: Injection Tendon/Ligament Consent: Verbal consent obtained. Location: Bilateral plantar fascia at the glabrous junction; medial approach. Skin Prep: Alcohol. Injectate: 1 cc 0.5% marcaine plain, 1 cc celestone Disposition: Patient tolerated procedure well. Injection site dressed with a band-aid.  Return in about 1 month (around 12/01/2020).

## 2020-11-03 ENCOUNTER — Ambulatory Visit: Payer: Medicare Other | Admitting: Orthotics

## 2020-11-03 ENCOUNTER — Other Ambulatory Visit: Payer: Self-pay

## 2020-11-03 DIAGNOSIS — M722 Plantar fascial fibromatosis: Secondary | ICD-10-CM

## 2020-11-03 NOTE — Progress Notes (Signed)
I explained again to [name] that it we would not get desirable outcome if we cast her in the [facility] for balance braces as she fights edema later in the day; it would be best to cast her somewhere in the middle around 1:30 or 2:00 when edema is not minimalized nor max.

## 2020-11-04 ENCOUNTER — Encounter: Payer: Self-pay | Admitting: Physical Therapy

## 2020-11-04 ENCOUNTER — Ambulatory Visit: Payer: Medicare Other | Attending: Family Medicine | Admitting: Physical Therapy

## 2020-11-04 DIAGNOSIS — G8929 Other chronic pain: Secondary | ICD-10-CM | POA: Insufficient documentation

## 2020-11-04 DIAGNOSIS — M6281 Muscle weakness (generalized): Secondary | ICD-10-CM | POA: Insufficient documentation

## 2020-11-04 DIAGNOSIS — R2689 Other abnormalities of gait and mobility: Secondary | ICD-10-CM | POA: Diagnosis not present

## 2020-11-04 DIAGNOSIS — R2681 Unsteadiness on feet: Secondary | ICD-10-CM | POA: Diagnosis not present

## 2020-11-04 DIAGNOSIS — M545 Low back pain, unspecified: Secondary | ICD-10-CM | POA: Diagnosis not present

## 2020-11-04 NOTE — Therapy (Signed)
[place], [place], [address] Phone: [phone]   Fax:  [phone]  Physical Therapy Treatment  Patient Details  Name: [name] MRN: [id number] Date of Birth: 11/24/1951 Referring Provider (PT): Lind Covert, MD   Encounter Date: 11/04/2020   PT End of Session - 11/04/20 1100    Visit Number 8    Number of Visits 12    Date for PT Re-Evaluation 11/20/20    Authorization Type UHC medicare    Authorization Time Period FOTO by 6th visit, 10 th visit    Progress Note Due on Visit 10    PT Start Time 1056    PT Stop Time 1138    PT Time Calculation (min) 42 min           Past Medical History:  Diagnosis Date  . Anemia   . Arthritis   . Asthma   . Blood transfusion without reported diagnosis    Patients believes 2015 when she overdosed on "medications"   . Breast cancer of lower-outer quadrant of right female breast ([place]) 02/13/2015  . Cataract   . CHF, acute ([place]) 05/03/2012  . Depression   . Diabetes mellitus   . Eczema   . Fatty liver    Fatty infiltration of liver noted on 03/2012 CT scan  . Fibromyalgia   . Glaucoma   . Heart disease   . History of kidney stones    w/ hx of hydronephrosis - followed by [facility]  . HIV nonspecific serology    2006: indeterminate HIV blood test, seen by ID, felt secondary to cross reacting antibodies with no further workup felt necessary at that time   . Hypertension   . Obesity   . Personal history of radiation therapy 2016   right  . Radiation 05/07/15-06/23/15   Right Breast    Past Surgical History:  Procedure Laterality Date  . BREAST BIOPSY Left 02/10/2015   malignant   . BREAST LUMPECTOMY Right 03/21/2015  . CHOLECYSTECTOMY  2003  . CYSTOSCOPY W/ LITHOLAPAXY / EHL    . JOINT REPLACEMENT     bilateral knee replacement  . LEFT AND RIGHT HEART CATHETERIZATION WITH CORONARY ANGIOGRAM N/A 05/02/2012   Procedure: LEFT AND RIGHT  HEART CATHETERIZATION WITH CORONARY ANGIOGRAM;  Surgeon: Burnell Blanks, MD;  Location: [facility] CATH LAB;  Service: Cardiovascular;  Laterality: N/A;  . RADIOACTIVE SEED GUIDED PARTIAL MASTECTOMY WITH AXILLARY SENTINEL LYMPH NODE BIOPSY Right 03/21/2015   Procedure: RIGHT  PARTIAL MASTECTOMY WITH RADIOACTIVE SEED LOCALIZATION RIGHT  AXILLARY SENTINEL LYMPH NODE BIOPSY;  Surgeon: Fanny Skates, MD;  Location: [facility];  Service: General;  Laterality: Right;  . REPLACEMENT TOTAL KNEE BILATERAL  2005 &2006  . VASCULAR SURGERY Right 03/15/2013   Ultrasound guided sclerotherapy    There were no vitals filed for this visit.   Subjective Assessment - 11/04/20 1058    Subjective Pt reports knees and heels are 8/10. She reports she received injections in both heels on Dec 3rd and has not seen improvement.    Currently in Pain? Yes    Pain Score 8     Pain Location Knee   and heels   Pain Orientation Right;Left    Pain Descriptors / Indicators Aching;Sharp    Pain Type Chronic pain    Aggravating Factors  weight bearing, transitions    Pain Relieving Factors get off feet  [facility] PT Assessment - 11/04/20 0001      Ambulation/Gait   Ambulation Distance (Feet) --    Gait Comments --      6 minute walk test results    Aerobic Endurance Distance Walked 923    Endurance additional comments no rest breaks required      Standardized Balance Assessment   Five times sit to stand comments  14.9                         [facility] Adult PT Treatment/Exercise - 11/04/20 0001      Knee/Hip Exercises: Stretches   Gastroc Stretch Limitations seated with strap    Soleus Stretch Limitations seated with strap       Knee/Hip Exercises: Aerobic   Nustep L5 x 8 min with LE only for endurance      Knee/Hip Exercises: Standing   Hip Flexion 2 sets;10 reps    Hip Flexion Limitations 2#    Hip Abduction 2 sets;10 reps    Abduction Limitations 2#    Hip  Extension 2 sets;10 reps    Extension Limitations 2#      Knee/Hip Exercises: Seated   Long Arc Quad 2 sets;20 reps    Long Arc Quad Weight 2 lbs.    Marching 20 reps    Marching Limitations 2#    Sit to [employer] 20 reps   cues for control and to remain on edge of seat                   PT Short Term Goals - 10/09/20 1057      PT SHORT TERM GOAL #1   Title Pt will be independent with initial HEP    Time 4    Period Weeks    Status Achieved    Target Date 09/24/20      PT SHORT TERM GOAL #2   Title Pt will be able to demonstate 5x STS without UE and no LOB    Baseline Patient was able to perform 5xSTS without UE assist this visit    Time 4    Period Weeks    Status Achieved    Target Date 09/24/20      PT SHORT TERM GOAL #3   Title Pt will improve TUG score to at least 12 sec    Baseline 12.94    Time 3    Period Weeks    Status On-going    Target Date 10/30/20             PT Long Term Goals - 10/09/20 1058      PT LONG TERM GOAL #1   Title Pt will be independent with advanced HEP    Time 6    Period Weeks    Status On-going    Target Date 11/20/20      PT LONG TERM GOAL #2   Title Pt will improve 5x STS to </= 12 seconds to reduce fall risk    Baseline 17.8 seconds    Time 6    Period Weeks    Status Revised    Target Date 11/20/20      PT LONG TERM GOAL #3   Title Pt will improve Berg Balance to at least 40/56 for decreased fall risk    Baseline 33/56    Time 6    Period Weeks    Status On-going    Target Date 11/20/20  PT LONG TERM GOAL #4   Title Patient will exhibit improved walking endurance by improvement of 300 ft with 6MWT    Baseline 1015 ft    Time 6    Period Weeks    Status New    Target Date 11/20/20                 Plan - 11/04/20 1114    Clinical Impression Statement Pt reports no relief with heel injections. She is agreeable to continue to work on gait and function despite her pain levels today. 6MWT 953ft  today. 5 x STS improved to 14.9 sec. Continued with open and closed chain LE strengthening. Reviewed heel stretches given last session.    PT Next Visit Plan Review HEP and progress PRN. Continue to progress LE strength. Balance training. Gait training    PT [place]           Patient will benefit from skilled therapeutic intervention in order to improve the following deficits and impairments:  Decreased balance, Decreased mobility, Difficulty walking, Decreased activity tolerance, Decreased strength  Visit Diagnosis: Unsteadiness on feet  Muscle weakness (generalized)  Other abnormalities of gait and mobility  Gait instability  Chronic midline low back pain, unspecified whether sciatica present     Problem List Patient Active Problem List   Diagnosis Date Noted  . Tinea pedis 04/09/2020  . Spinal stenosis of lumbar region without neurogenic claudication 02/08/2019  . Nephropathy, diabetic ([place]) 02/07/2019  . Gout 05/16/2018  . Gait instability 01/03/2018  . Frequent bowel movements 11/02/2017  . Tremor of both hands   . Stuttering   . Essential hypertension   . Hyperlipidemia 08/13/2015  . Breast cancer of lower-outer quadrant of right female breast ([place]) 02/13/2015  . Vitamin D deficiency 02/06/2015  . Type 2 diabetes mellitus with diabetic foot deformity ([place]) 01/01/2014  . Chronic combined systolic and diastolic heart failure ([place]) 02/26/2013  . OBESITY 02/05/2009  . Asthma, intermittent 02/08/2008  . NEPHROLITHIASIS 01/26/2007  . DJD (degenerative joint disease), multiple sites 01/26/2007    Dorene Ar, PTA 11/04/2020, 11:50 AM  [facility] [address] [place], [place], [address] Phone: [phone]   Fax:  [phone]  Name: [name] MRN: [id number] Date of Birth: December 10, 1950

## 2020-11-11 ENCOUNTER — Other Ambulatory Visit: Payer: Self-pay | Admitting: Family Medicine

## 2020-11-11 ENCOUNTER — Ambulatory Visit: Payer: Medicare Other | Admitting: Physical Therapy

## 2020-11-11 ENCOUNTER — Encounter: Payer: Self-pay | Admitting: Physical Therapy

## 2020-11-11 ENCOUNTER — Other Ambulatory Visit: Payer: Self-pay

## 2020-11-11 DIAGNOSIS — R2689 Other abnormalities of gait and mobility: Secondary | ICD-10-CM | POA: Diagnosis not present

## 2020-11-11 DIAGNOSIS — G8929 Other chronic pain: Secondary | ICD-10-CM | POA: Diagnosis not present

## 2020-11-11 DIAGNOSIS — R2681 Unsteadiness on feet: Secondary | ICD-10-CM | POA: Diagnosis not present

## 2020-11-11 DIAGNOSIS — M545 Low back pain, unspecified: Secondary | ICD-10-CM

## 2020-11-11 DIAGNOSIS — E785 Hyperlipidemia, unspecified: Secondary | ICD-10-CM

## 2020-11-11 DIAGNOSIS — M6281 Muscle weakness (generalized): Secondary | ICD-10-CM | POA: Diagnosis not present

## 2020-11-11 NOTE — Therapy (Signed)
[place], [place], [address] Phone: [phone]   Fax:  [phone]  Physical Therapy Treatment  Patient Details  Name: [name] MRN: [id number] Date of Birth: 1951-07-18 Referring Provider (PT): Lind Covert, MD   Encounter Date: 11/11/2020   PT End of Session - 11/11/20 1021    Visit Number 9    Number of Visits 12    Date for PT Re-Evaluation 11/20/20    Authorization Type UHC medicare    Authorization Time Period FOTO by 6th visit (done) , 10 th visit    Progress Note Due on Visit 16    PT Start Time 1015    PT Stop Time 1059    PT Time Calculation (min) 44 min           Past Medical History:  Diagnosis Date  . Anemia   . Arthritis   . Asthma   . Blood transfusion without reported diagnosis    Patients believes 2015 when she overdosed on "medications"   . Breast cancer of lower-outer quadrant of right female breast ([place]) 02/13/2015  . Cataract   . CHF, acute ([place]) 05/03/2012  . Depression   . Diabetes mellitus   . Eczema   . Fatty liver    Fatty infiltration of liver noted on 03/2012 CT scan  . Fibromyalgia   . Glaucoma   . Heart disease   . History of kidney stones    w/ hx of hydronephrosis - followed by [facility]  . HIV nonspecific serology    2006: indeterminate HIV blood test, seen by ID, felt secondary to cross reacting antibodies with no further workup felt necessary at that time   . Hypertension   . Obesity   . Personal history of radiation therapy 2016   right  . Radiation 05/07/15-06/23/15   Right Breast    Past Surgical History:  Procedure Laterality Date  . BREAST BIOPSY Left 02/10/2015   malignant   . BREAST LUMPECTOMY Right 03/21/2015  . CHOLECYSTECTOMY  2003  . CYSTOSCOPY W/ LITHOLAPAXY / EHL    . JOINT REPLACEMENT     bilateral knee replacement  . LEFT AND RIGHT HEART CATHETERIZATION WITH CORONARY ANGIOGRAM N/A 05/02/2012   Procedure: LEFT  AND RIGHT HEART CATHETERIZATION WITH CORONARY ANGIOGRAM;  Surgeon: Burnell Blanks, MD;  Location: [facility] CATH LAB;  Service: Cardiovascular;  Laterality: N/A;  . RADIOACTIVE SEED GUIDED PARTIAL MASTECTOMY WITH AXILLARY SENTINEL LYMPH NODE BIOPSY Right 03/21/2015   Procedure: RIGHT  PARTIAL MASTECTOMY WITH RADIOACTIVE SEED LOCALIZATION RIGHT  AXILLARY SENTINEL LYMPH NODE BIOPSY;  Surgeon: Fanny Skates, MD;  Location: [place];  Service: General;  Laterality: Right;  . REPLACEMENT TOTAL KNEE BILATERAL  2005 &2006  . VASCULAR SURGERY Right 03/15/2013   Ultrasound guided sclerotherapy    There were no vitals filed for this visit.   Subjective Assessment - 11/11/20 1020    Subjective Pt reports knees and heels are still bothersome. Pain is rated at 8/10 in both.    Pain Score 8     Pain Location Knee    Pain Orientation Right;Left    Pain Descriptors / Indicators Aching;Sharp    Pain Type Chronic pain    Aggravating Factors  cold weather, weight bearing, transitions    Pain Relieving Factors get off fett  [place] Adult PT Treatment/Exercise - 11/11/20 0001      Ambulation/Gait   Stairs Yes    Stairs Assistance 5: Supervision    Stair Management Technique Alternating pattern    Number of Stairs 8    Height of Stairs 6      Neuro Re-ed    Neuro Re-ed Details  Airex: romberg x 30 sec, romberg with head turns 2 x 30 sec, romberg with head nods 2 x 30 sec; Flat surface: 3/4 tandem 2 x 30 sec each, normal stance with trunk rotations, normal stance eyes closed, alternating step onto foam and off with 1 UE, step taps to foam alternating      Knee/Hip Exercises: Aerobic   Nustep L5 x 7 minutes      Knee/Hip Exercises: Standing   Hip Flexion 2 sets;10 reps    Hip Flexion Limitations 2#    Hip Abduction 2 sets;10 reps    Abduction Limitations 2#    Hip Extension 2 sets;10 reps    Extension Limitations 2#      Knee/Hip  Exercises: Seated   Long Arc Quad 1 set;20 reps    Long Arc Quad Weight 4 lbs.    Sit to [place] 20 reps   cues for control and to remain on edge of seat                   PT Short Term Goals - 10/09/20 1057      PT SHORT TERM GOAL #1   Title Pt will be independent with initial HEP    Time 4    Period Weeks    Status Achieved    Target Date 09/24/20      PT SHORT TERM GOAL #2   Title Pt will be able to demonstate 5x STS without UE and no LOB    Baseline Patient was able to perform 5xSTS without UE assist this visit    Time 4    Period Weeks    Status Achieved    Target Date 09/24/20      PT SHORT TERM GOAL #3   Title Pt will improve TUG score to at least 12 sec    Baseline 12.94    Time 3    Period Weeks    Status On-going    Target Date 10/30/20             PT Long Term Goals - 10/09/20 1058      PT LONG TERM GOAL #1   Title Pt will be independent with advanced HEP    Time 6    Period Weeks    Status On-going    Target Date 11/20/20      PT LONG TERM GOAL #2   Title Pt will improve 5x STS to </= 12 seconds to reduce fall risk    Baseline 17.8 seconds    Time 6    Period Weeks    Status Revised    Target Date 11/20/20      PT LONG TERM GOAL #3   Title Pt will improve Berg Balance to at least 40/56 for decreased fall risk    Baseline 33/56    Time 6    Period Weeks    Status On-going    Target Date 11/20/20      PT LONG TERM GOAL #4   Title Patient will exhibit improved walking endurance by improvement of 300 ft with 6MWT    Baseline 1015 ft  Time 6    Period Weeks    Status New    Target Date 11/20/20                 Plan - 11/11/20 1052    Clinical Impression Statement Pt reportsc continued high levels of pain in heels and knees. She is still agreeable to therex. Continued with Balance, LE strength and stair clibing. Pt was able to negatiate reciprocal stairs and and down using bilateral UE support and supervicion.    PT Next  Visit Plan Review HEP and progress PRN. Continue to progress LE strength. Balance training. Gait training    PT [place]           Patient will benefit from skilled therapeutic intervention in order to improve the following deficits and impairments:  Decreased balance,Decreased mobility,Difficulty walking,Decreased activity tolerance,Decreased strength  Visit Diagnosis: Unsteadiness on feet  Muscle weakness (generalized)  Other abnormalities of gait and mobility  Gait instability  Chronic midline low back pain, unspecified whether sciatica present     Problem List Patient Active Problem List   Diagnosis Date Noted  . Tinea pedis 04/09/2020  . Spinal stenosis of lumbar region without neurogenic claudication 02/08/2019  . Nephropathy, diabetic ([place]) 02/07/2019  . Gout 05/16/2018  . Gait instability 01/03/2018  . Frequent bowel movements 11/02/2017  . Tremor of both hands   . Stuttering   . Essential hypertension   . Hyperlipidemia 08/13/2015  . Breast cancer of lower-outer quadrant of right female breast ([place]) 02/13/2015  . Vitamin D deficiency 02/06/2015  . Type 2 diabetes mellitus with diabetic foot deformity ([place]) 01/01/2014  . Chronic combined systolic and diastolic heart failure ([place]) 02/26/2013  . OBESITY 02/05/2009  . Asthma, intermittent 02/08/2008  . NEPHROLITHIASIS 01/26/2007  . DJD (degenerative joint disease), multiple sites 01/26/2007    Dorene Ar, PTA 11/11/2020, 10:58 AM  [facility] [address] [place], [place], [address] Phone: [phone]   Fax:  [phone]  Name: [name] MRN: [id number] Date of Birth: [birth year]

## 2020-11-18 ENCOUNTER — Encounter: Payer: Self-pay | Admitting: Physical Therapy

## 2020-11-18 ENCOUNTER — Ambulatory Visit: Payer: Medicare Other | Admitting: Physical Therapy

## 2020-11-18 ENCOUNTER — Other Ambulatory Visit: Payer: Self-pay

## 2020-11-18 DIAGNOSIS — R2689 Other abnormalities of gait and mobility: Secondary | ICD-10-CM

## 2020-11-18 DIAGNOSIS — M6281 Muscle weakness (generalized): Secondary | ICD-10-CM

## 2020-11-18 DIAGNOSIS — R2681 Unsteadiness on feet: Secondary | ICD-10-CM

## 2020-11-18 DIAGNOSIS — G8929 Other chronic pain: Secondary | ICD-10-CM | POA: Diagnosis not present

## 2020-11-18 DIAGNOSIS — M545 Low back pain, unspecified: Secondary | ICD-10-CM

## 2020-11-18 NOTE — Therapy (Addendum)
[place], [place], [address] Phone: [phone]   Fax:  [phone]  Physical Therapy Treatment / Discharge  Patient Details  Name: [name] MRN: [id number] Date of Birth: 1951/04/15 Referring Provider (PT): Lind Covert, MD   Encounter Date: 11/18/2020   PT End of Session - 11/18/20 1123    Visit Number 10    Number of Visits 12    Date for PT Re-Evaluation 11/20/20    Authorization Type UHC medicare    Authorization Time Period FOTO by 6th visit (done) , 11th th visit    PT Start Time 1017    PT Stop Time 1058    PT Time Calculation (min) 41 min           Past Medical History:  Diagnosis Date  . Anemia   . Arthritis   . Asthma   . Blood transfusion without reported diagnosis    Patients believes 2015 when she overdosed on "medications"   . Breast cancer of lower-outer quadrant of right female breast ([place]) 02/13/2015  . Cataract   . CHF, acute ([place]) 05/03/2012  . Depression   . Diabetes mellitus   . Eczema   . Fatty liver    Fatty infiltration of liver noted on 03/2012 CT scan  . Fibromyalgia   . Glaucoma   . Heart disease   . History of kidney stones    w/ hx of hydronephrosis - followed by [facility]  . HIV nonspecific serology    2006: indeterminate HIV blood test, seen by ID, felt secondary to cross reacting antibodies with no further workup felt necessary at that time   . Hypertension   . Obesity   . Personal history of radiation therapy 2016   right  . Radiation 05/07/15-06/23/15   Right Breast    Past Surgical History:  Procedure Laterality Date  . BREAST BIOPSY Left 02/10/2015   malignant   . BREAST LUMPECTOMY Right 03/21/2015  . CHOLECYSTECTOMY  2003  . CYSTOSCOPY W/ LITHOLAPAXY / EHL    . JOINT REPLACEMENT     bilateral knee replacement  . LEFT AND RIGHT HEART CATHETERIZATION WITH CORONARY ANGIOGRAM N/A 05/02/2012   Procedure: LEFT AND RIGHT HEART  CATHETERIZATION WITH CORONARY ANGIOGRAM;  Surgeon: Burnell Blanks, MD;  Location: [facility] CATH LAB;  Service: Cardiovascular;  Laterality: N/A;  . RADIOACTIVE SEED GUIDED PARTIAL MASTECTOMY WITH AXILLARY SENTINEL LYMPH NODE BIOPSY Right 03/21/2015   Procedure: RIGHT  PARTIAL MASTECTOMY WITH RADIOACTIVE SEED LOCALIZATION RIGHT  AXILLARY SENTINEL LYMPH NODE BIOPSY;  Surgeon: Fanny Skates, MD;  Location: [place];  Service: General;  Laterality: Right;  . REPLACEMENT TOTAL KNEE BILATERAL  2005 &2006  . VASCULAR SURGERY Right 03/15/2013   Ultrasound guided sclerotherapy    There were no vitals filed for this visit.   Subjective Assessment - 11/18/20 1022    Subjective 7/10 knees and heels, no change.    Currently in Pain? Yes    Pain Score 7     Pain Location Knee    Pain Orientation Right;Left    Pain Descriptors / Indicators Aching;Sharp    Pain Type Chronic pain                             [facility] Adult PT Treatment/Exercise - 11/18/20 0001      Ambulation/Gait   Ambulation Distance (Feet) --   2 laps inside clinic  Assistive device Rollator    Stairs Yes    Stairs Assistance 5: Supervision    Stair Management Technique Alternating pattern    Number of Stairs 8    Height of Stairs 6      Knee/Hip Exercises: Stretches   Other Knee/Hip Stretches slant board bilateral      Knee/Hip Exercises: Aerobic   Nustep L5 x 8 minutes      Knee/Hip Exercises: Standing   Hip Flexion 2 sets;10 reps    Hip Flexion Limitations 2#    Hip Abduction 2 sets;10 reps    Abduction Limitations 2#      Knee/Hip Exercises: Seated   Long Arc Quad 1 set;20 reps    Long Arc Quad Weight 5 lbs.    Marching 20 reps    Sit to [employer] 10 reps      Knee/Hip Exercises: Supine   Straight Leg Raises 10 reps   2 sets   Straight Leg Raises Limitations with ab brace                    PT Short Term Goals - 10/09/20 1057      PT SHORT TERM GOAL #1   Title Pt  will be independent with initial HEP    Time 4    Period Weeks    Status Achieved    Target Date 09/24/20      PT SHORT TERM GOAL #2   Title Pt will be able to demonstate 5x STS without UE and no LOB    Baseline Patient was able to perform 5xSTS without UE assist this visit    Time 4    Period Weeks    Status Achieved    Target Date 09/24/20      PT SHORT TERM GOAL #3   Title Pt will improve TUG score to at least 12 sec    Baseline 12.94    Time 3    Period Weeks    Status On-going    Target Date 10/30/20             PT Long Term Goals - 10/09/20 1058      PT LONG TERM GOAL #1   Title Pt will be independent with advanced HEP    Time 6    Period Weeks    Status On-going    Target Date 11/20/20      PT LONG TERM GOAL #2   Title Pt will improve 5x STS to </= 12 seconds to reduce fall risk    Baseline 17.8 seconds    Time 6    Period Weeks    Status Revised    Target Date 11/20/20      PT LONG TERM GOAL #3   Title Pt will improve Berg Balance to at least 40/56 for decreased fall risk    Baseline 33/56    Time 6    Period Weeks    Status On-going    Target Date 11/20/20      PT LONG TERM GOAL #4   Title Patient will exhibit improved walking endurance by improvement of 300 ft with 6MWT    Baseline 1015 ft    Time 6    Period Weeks    Status New    Target Date 11/20/20                 Plan - 11/18/20 1052    Clinical Impression Statement Pt reports she is unchanged and  rates her knee and heel pain at 7/10. Session focused on gait and LE strength. She reported increased right sided back pain with standing therex and LAQ today. Therex was modified today to avoid pain increase.    PT Next Visit Plan re-eval verses DC next visit, [place]           Patient will benefit from skilled therapeutic intervention in order to improve the following deficits and impairments:  Decreased balance,Decreased mobility,Difficulty  walking,Decreased activity tolerance,Decreased strength  Visit Diagnosis: Unsteadiness on feet  Muscle weakness (generalized)  Other abnormalities of gait and mobility  Gait instability  Chronic midline low back pain, unspecified whether sciatica present     Problem List Patient Active Problem List   Diagnosis Date Noted  . Tinea pedis 04/09/2020  . Spinal stenosis of lumbar region without neurogenic claudication 02/08/2019  . Nephropathy, diabetic ([place]) 02/07/2019  . Gout 05/16/2018  . Gait instability 01/03/2018  . Frequent bowel movements 11/02/2017  . Tremor of both hands   . Stuttering   . Essential hypertension   . Hyperlipidemia 08/13/2015  . Breast cancer of lower-outer quadrant of right female breast ([place]) 02/13/2015  . Vitamin D deficiency 02/06/2015  . Type 2 diabetes mellitus with diabetic foot deformity ([place]) 01/01/2014  . Chronic combined systolic and diastolic heart failure ([place]) 02/26/2013  . OBESITY 02/05/2009  . Asthma, intermittent 02/08/2008  . NEPHROLITHIASIS 01/26/2007  . DJD (degenerative joint disease), multiple sites 01/26/2007    Dorene Ar, PTA 11/18/2020, 11:25 AM  [facility] [address] [place], [place], [address] Phone: [phone]   Fax:  [phone]  Name: [name] MRN: [id number] Date of Birth: 09/02/1951   PHYSICAL THERAPY DISCHARGE SUMMARY  Visits from Start of Care: 10  Current functional level related to goals / functional outcomes: See above   Remaining deficits: See above   Education / Equipment: HEP Plan: Patient agrees to discharge.  Patient goals were partially met. Patient is being discharged due to lack of progress.  ?????    Hilda Blades, PT, DPT, LAT, ATC 11/26/20  3:25 PM Phone: [phone] Fax: [phone]

## 2020-11-25 ENCOUNTER — Telehealth: Payer: Self-pay | Admitting: Podiatry

## 2020-11-25 ENCOUNTER — Encounter: Payer: Medicare Other | Admitting: Physical Therapy

## 2020-11-25 ENCOUNTER — Other Ambulatory Visit: Payer: Self-pay | Admitting: *Deleted

## 2020-11-25 ENCOUNTER — Ambulatory Visit: Payer: Medicare Other | Admitting: Physical Therapy

## 2020-11-25 DIAGNOSIS — M722 Plantar fascial fibromatosis: Secondary | ICD-10-CM

## 2020-11-25 MED ORDER — MELOXICAM 7.5 MG PO TABS: 7.5000 mg | ORAL_TABLET | Freq: Every day | ORAL | 0 refills | Status: DC

## 2020-11-25 NOTE — Addendum Note (Signed)
Addended by: Ventura Sellers on: 11/25/2020 02:39 PM   Modules accepted: Orders

## 2020-11-25 NOTE — Telephone Encounter (Signed)
Patient needs the Meloxicam sent to [facility] because they will deliver it to the patient. The fax number is [phone]. Phone number is [phone]. They can deliver it today if they can get it faxed over today

## 2020-11-25 NOTE — Telephone Encounter (Signed)
Pt called and states she is in severe pain and wanted to know what she can do until her appointment on 12/05/2020

## 2020-11-25 NOTE — Telephone Encounter (Signed)
Will send her an anti-inflammatory medicine - she cannot take any other non-steroidal anti-inflammatory including Ibuprofen, Aleve, and her Colchicine that she has had prescribed.

## 2020-11-26 ENCOUNTER — Telehealth: Payer: Self-pay | Admitting: Physical Therapy

## 2020-11-26 NOTE — Patient Instructions (Signed)
Goals Addressed            This Visit's Progress    (THN)Learn More About My Health   On track    Timeframe:  Short-Term Goal Priority:  Medium Start Date:  44315400                 Expected End Date:  86761950                     Follow Up Date [id number]   - make a list of questions - bring a list of my medicines to the visit - speak up when I don't understand    Why is this important?   The best way to learn about your health and care is by talking to the doctor and nurse.  They will answer your questions and give you information in the way that you like best.    Notes:      [facility] and Keep All Appointments   Not on track    Timeframe:  Long-Range Goal Priority:  High Start Date:     58099833                        Expected End Date:  82505397                    Follow Up Date 67341937   - arrange a ride through an agency 1 week before appointment - call to cancel if needed - keep a calendar with prescription refill dates - keep a calendar with appointment dates    Why is this important?   Part of staying healthy is seeing the doctor for follow-up care.  If you forget your appointments, there are some things you can do to stay on track.    Notes:  Per patient she had to cancel a rehab appointment due to the foot pain.     (THN)Monitor and Manage My Blood Sugar   On track    Timeframe:  Long-Range Goal Priority:  High Start Date:       90240973                      Expected End Date:    53299242                  Follow Up Date 68341962   - check blood sugar at prescribed times - check blood sugar if I feel it is too high or too low - enter blood sugar readings and medication or insulin into daily log - take the blood sugar log to all doctor visits - take the blood sugar meter to all doctor visits    Why is this important?   Checking your blood sugar at home helps to keep it from getting very high or very low.  Writing the results in a diary or log helps  the doctor know how to care for you.  Your blood sugar log should have the time, date and the results.  Also, write down the amount of insulin or other medicine that you take.  Other information, like what you ate, exercise done and how you were feeling, will also be helpful.     Notes:      [facility] Eye Exam   Not on track    Timeframe:  Long-Range Goal Priority:  Medium Start Date:  22979892  Expected End Date:  92010071                     Follow Up Date 21975883   - schedule appointment with eye doctor    Why is this important?   Eye check-ups are important when you have diabetes.  Vision loss can be prevented.    Notes:  Last eye exam 09/17/2019 Need to schedule next exam     (THN)Perform Foot Care   On track    Timeframe:  Long-Range Goal Priority:  Medium Start Date: 25498264                            Expected End Date: 15830940                     Follow Up Date 76808811   - check feet daily for cuts, sores or redness - keep feet up while sitting - trim toenails straight across - wash and dry feet carefully every day - wear comfortable, cotton socks - wear comfortable, well-fitting shoes    Why is this important?   Good foot care is very important when you have diabetes.  There are many things you can do to keep your feet healthy and catch a problem early.    Notes:  Last podiatry appointment 10/31/2020     (THN)Set My Target A1C   Not on track    Timeframe:  Short-Term Goal Priority:  High Start Date:   03159458                          Expected End Date:   59292446                   Follow Up Date 28638177   - set target A1C    Why is this important?   Your target A1C is decided together by you and your doctor.  It is based on several things like your age and other health issues.    Notes:  7.0  now A1C 8.3

## 2020-11-26 NOTE — Telephone Encounter (Signed)
Contacted patient due to missed appointment yesterday. She stated she called and rescheduled that appointment to 12/13/2019 due to severe pain of feet. Patient's rescheduled appointment was with a PTA and due to being outside POC she would need to reschedule that appointment with a PT. Patient elected to cancel the appointment and be discharged since that would have been her last appointment anyway. Patient instructed she would need a new PT referral in order to return to therapy. Patient expressed understanding.   Rosana Hoes, PT, DPT, LAT, ATC 11/26/20  3:28 PM Phone: [phone] Fax: [phone]

## 2020-11-26 NOTE — Telephone Encounter (Signed)
Called patient and informed her that Dr. Samuella Cota stated she cannot take any other non-steroidal anti-inflammatory including Ibuprofen, Aleve, and her Colchicine that she has had prescribed

## 2020-11-26 NOTE — Patient Outreach (Signed)
[facility]) Care Management  [place]  00/76/2263   [name] [birth year] [phone]  RN Health Coach telephone call to patient.  Hipaa compliance verified. Per patient she is in a lot of foot pain. Per patient her foot appointment is not until next week. Her fasting blood sugar is 173. Per patient her blood sugar has been ranging from 173-136 fasting. Patient stated that she knows she has been off her diet. Patient has received the COVID and flu shot. Patient did not got to her last rehab due to unable to put pressure on heels. Patient has agreed to further outreach calls.  Encounter Medications:  Outpatient Encounter Medications as of 11/25/2020  Medication Sig Note  . acetaminophen (TYLENOL 8 HOUR ARTHRITIS PAIN) 650 MG CR tablet Take 650 mg by mouth every 8 (eight) hours as needed for pain. 08/14/2019: Takes 2 tablets in the morning, 1 tablet at noon, and 2 tablets at nights  . allopurinol (ZYLOPRIM) 100 MG tablet TAKE 1 TABLET BY MOUTH  DAILY   . atorvastatin (LIPITOR) 40 MG tablet TAKE 1 TABLET BY MOUTH  DAILY   . Blood Glucose Monitoring Suppl (ONETOUCH VERIO FLEX SYSTEM) w/Device KIT USE AS DIRECTED   . colchicine 0.6 MG tablet TAKE 1 TABLET BY MOUTH  DAILY   . dorzolamide (TRUSOPT) 2 % ophthalmic solution 1 drop daily.   . dorzolamide-timolol (COSOPT) 22.3-6.8 MG/ML ophthalmic solution Place 1 drop into both eyes 2 (two) times daily.   . empagliflozin (JARDIANCE) 25 MG TABS tablet Take 1 tablet (25 mg total) by mouth daily.   Marland Kitchen FLOVENT HFA 110 MCG/ACT inhaler USE 2 INHALATIONS BY MOUTH  TWICE DAILY   . gabapentin (NEURONTIN) 100 MG capsule TAKE 2 CAPSULES BY MOUTH bid   . Insulin Pen Needle (PEN NEEDLES 5/16") 30G X 8 MM MISC Use to inject insulin as directed.  Dx code:E11.69   . Lancet Devices (ONETOUCH DELICA PLUS LANCING) MISC USE ONCE DAILY   . latanoprost (XALATAN) 0.005 % ophthalmic solution Place 1 drop into both eyes at bedtime.    Marland Kitchen losartan  (COZAAR) 100 MG tablet Take 0.5 tablets (50 mg total) by mouth at bedtime.   . metFORMIN (GLUCOPHAGE-XR) 500 MG 24 hr tablet TAKE 1 TABLET BY MOUTH  TWICE DAILY   . metoprolol succinate (TOPROL-XL) 25 MG 24 hr tablet TAKE 1 TABLET BY MOUTH  DAILY   . Miconazole Nitrate (LOTRIMIN AF) 2 % AERO Spray between toes once daily for 4 weeks.   Marland Kitchen neomycin (MYCIFRADIN) 500 MG tablet Take by mouth 3 (three) times daily.   Marland Kitchen neomycin-polymyxin b-dexamethasone (MAXITROL) 3.5-10000-0.1 SUSP    . OneTouch Delica Lancets 28J MISC 1 Container by Does not apply route daily as needed.   [name] test strip CHECK BLOOD SUGAR IN THE  MORNING AS DIRECTED   . spironolactone (ALDACTONE) 25 MG tablet TAKE 1 TABLET BY MOUTH  DAILY   . VICTOZA 18 MG/3ML SOPN INJECT 1.8 MG (0.3 ML)  SUBCUTANEOUSLY DAILY   . [DISCONTINUED] meloxicam (MOBIC) 7.5 MG tablet Take 1 tablet (7.5 mg total) by mouth daily.    No facility-administered encounter medications on file as of 11/25/2020.    Functional Status:  In your present state of health, do you have any difficulty performing the following activities: 11/27/2019  Hearing? N  Vision? N  Difficulty concentrating or making decisions? N  Walking or climbing stairs? Y  Dressing or bathing? N  Doing errands, shopping? [name]  Some recent data might be hidden    Fall/Depression Screening: Fall Risk  11/25/2020 08/28/2020 08/05/2020  Falls in the past year? 0 0 0  Number falls in past yr: 0 0 0  Injury with Fall? 0 0 0  Risk Factor Category  - - -  Risk for fall due to : Impaired balance/gait;Impaired mobility Impaired balance/gait;Impaired mobility -  Risk for fall due to: Comment - - -  Follow up Falls evaluation completed Falls evaluation completed -  Comment - - -   PHQ 2/9 Scores 10/29/2020 05/06/2020 02/21/2020 02/13/2020 11/27/2019 11/13/2019 10/19/2019  PHQ - 2 Score 2 1 0 0 1 1 0  PHQ- 9 Score 7 - - - - - -    Assessment:  Goals Addressed            This Visit's  Progress   . (THN)Learn More About My Health   On track    Timeframe:  Short-Term Goal Priority:  Medium Start Date:  32951884                 Expected End Date:  16606301                     Follow Up Date 60109323   - make a list of questions - bring a list of my medicines to the visit - speak up when I don't understand    Why is this important?   The best way to learn about your health and care is by talking to the doctor and nurse.  They will answer your questions and give you information in the way that you like best.    Notes:     . [facility] and Keep All Appointments   Not on track    Timeframe:  Long-Range Goal Priority:  High Start Date:     55732202                        Expected End Date:  54270623                    Follow Up Date 76283151   - arrange a ride through an agency 1 week before appointment - call to cancel if needed - keep a calendar with prescription refill dates - keep a calendar with appointment dates    Why is this important?   Part of staying healthy is seeing the doctor for follow-up care.  If you forget your appointments, there are some things you can do to stay on track.    Notes:  Per patient she had to cancel a rehab appointment due to the foot pain.    . (THN)Monitor and Manage My Blood Sugar   On track    Timeframe:  Long-Range Goal Priority:  High Start Date:       76160737                      Expected End Date:    10626948                  Follow Up Date 54627035   - check blood sugar at prescribed times - check blood sugar if I feel it is too high or too low - enter blood sugar readings and medication or insulin into daily log - take the blood sugar log to all doctor visits - take the blood sugar meter to all doctor visits  Why is this important?   Checking your blood sugar at home helps to keep it from getting very high or very low.  Writing the results in a diary or log helps the doctor know how to care for you.  Your  blood sugar log should have the time, date and the results.  Also, write down the amount of insulin or other medicine that you take.  Other information, like what you ate, exercise done and how you were feeling, will also be helpful.     Notes:     . [facility] Eye Exam   Not on track    Timeframe:  Long-Range Goal Priority:  Medium Start Date:  46619820                           Expected End Date:  45800190                     Follow Up Date 83449328   - schedule appointment with eye doctor    Why is this important?   Eye check-ups are important when you have diabetes.  Vision loss can be prevented.    Notes:  Last eye exam 09/17/2019 Need to schedule next exam    . (THN)Perform Foot Care   On track    Timeframe:  Long-Range Goal Priority:  Medium Start Date: 46486961                            Expected End Date: 34827798                     Follow Up Date 59210543   - check feet daily for cuts, sores or redness - keep feet up while sitting - trim toenails straight across - wash and dry feet carefully every day - wear comfortable, cotton socks - wear comfortable, well-fitting shoes    Why is this important?   Good foot care is very important when you have diabetes.  There are many things you can do to keep your feet healthy and catch a problem early.    Notes:  Last podiatry appointment 10/31/2020    . (THN)Set My Target A1C   Not on track    Timeframe:  Short-Term Goal Priority:  High Start Date:   20452178                          Expected End Date:   02911552                   Follow Up Date 91977715   - set target A1C    Why is this important?   Your target A1C is decided together by you and your doctor.  It is based on several things like your age and other health issues.    Notes:  7.0  now A1C 8.3       Plan:  Follow-up:  Patient agrees to Care Plan and Follow-up. RN notified Podiatry to send medication to pharmacy that will deliver Patient  will continue rehab services Patient will monitor diet more closely Patient will monitor blood sugars and document Patient will schedule eye exam RN will follow up outreach within the month of March RN provided a new calendar book for documentation RN sent update assessment to PCP  Gean Maidens BSN RN [facility] Care Management  [phone] .  

## 2020-12-04 ENCOUNTER — Other Ambulatory Visit: Payer: Self-pay

## 2020-12-04 ENCOUNTER — Other Ambulatory Visit: Payer: Self-pay | Admitting: Family Medicine

## 2020-12-04 ENCOUNTER — Ambulatory Visit: Payer: Medicare Other | Admitting: Orthotics

## 2020-12-04 DIAGNOSIS — Z9181 History of falling: Secondary | ICD-10-CM

## 2020-12-04 DIAGNOSIS — E11610 Type 2 diabetes mellitus with diabetic neuropathic arthropathy: Secondary | ICD-10-CM

## 2020-12-04 DIAGNOSIS — M7732 Calcaneal spur, left foot: Secondary | ICD-10-CM

## 2020-12-04 DIAGNOSIS — R2689 Other abnormalities of gait and mobility: Secondary | ICD-10-CM

## 2020-12-04 DIAGNOSIS — M722 Plantar fascial fibromatosis: Secondary | ICD-10-CM

## 2020-12-04 DIAGNOSIS — R269 Unspecified abnormalities of gait and mobility: Secondary | ICD-10-CM

## 2020-12-04 MED ORDER — "PEN NEEDLES 5/16"" 30G X 8 MM MISC": 0 refills | Status: DC

## 2020-12-04 NOTE — Progress Notes (Signed)
Patient came in today for Eval/assessment for Moore Balance Brace.  Patient presents with a history of falling, and also fearful of falling.  Balance tests performed and patient does have issues keeping balance especially in foot to heel test.  Patient has week dorsiflexors and well as inverters/everters muscle group.  Demonstrants ankle instability.   

## 2020-12-05 ENCOUNTER — Ambulatory Visit (INDEPENDENT_AMBULATORY_CARE_PROVIDER_SITE_OTHER): Payer: Medicare Other | Admitting: Podiatry

## 2020-12-05 DIAGNOSIS — M722 Plantar fascial fibromatosis: Secondary | ICD-10-CM

## 2020-12-08 ENCOUNTER — Telehealth: Payer: Self-pay

## 2020-12-08 NOTE — Telephone Encounter (Signed)
Patient calls nurse line regarding concerns with elevated BP. Patient reports that BP has been elevated since yesterday. Patient reports readings of 144/74 yesterday AM, 180/110 (after falling at church), with most recent BP of 144/88. Patient reports medication compliance and is asymptomatic currently. Offered to schedule patient appointment to discuss further, however, patient declined as PCP does not have any availability until Feb.   Provided patient with strict ED precautions.  Talbot Grumbling, RN

## 2020-12-12 ENCOUNTER — Ambulatory Visit: Payer: Medicare Other | Admitting: Physical Therapy

## 2020-12-29 NOTE — Progress Notes (Signed)
[address]   [phone] [phone]   Clinic Follow up Note   Patient Care Team: Lind Covert, MD as PCP - General (Family Medicine) Gevena Cotton, MD (Ophthalmology) Rana Snare, MD (Urology) Dr. Edwin Cap (Experiment) Fanny Skates, MD as Consulting Physician (General Surgery) Holley Bouche, NP (Inactive) as Nurse Practitioner (Nurse Practitioner) Truitt Merle, MD as Consulting Physician (Hematology) Gardiner Barefoot, DPM (Podiatry) Pleasant, Eppie Gibson, RN as [place] [facility] Chambliss, Jeb Levering, MD (Family Medicine) 12/30/2020  CHIEF COMPLAINT: Follow up right breast cancer   SUMMARY OF ONCOLOGIC HISTORY: Oncology History Overview Note    Breast cancer of lower-outer quadrant of right female breast   Staging form: Breast, AJCC 7th Edition     Clinical stage from 02/19/2015: Stage IA (T1b, N0, M0) - Unsigned       Staging comments: Staged at breast conference on 3.23.16      Pathologic stage from 03/24/2015: Stage IA (T1c, N0, cM0) - Signed by Enid Cutter, MD on 03/31/2015       Staging comments: Staged on final lumpectomy specimen by Dr. Donato Heinz.    Breast cancer of lower-outer quadrant of right female breast ([place])  01/23/2015 Mammogram   Possible mass in right breast warrants further evaluation.  No findings in the left breast suspicious for malignancy   02/03/2015 Breast US   Right breast: 5 x 5 x 5 mm irregular hypoechoic mass with internal color vascularity at 6 o'clock position, 4 cm from the nipple   02/10/2015 Initial Biopsy   Right needle core bx LOQ: Invasive ductal carcinoma, grade 1, ER+ (100%), PR+ (60%), HER2/neu negative (ratio 1.13), Ki67 3%; DCIS.     02/12/2015 Breast MRI   Biopsy-proven malignancy in the central right breast at posterior depth. No MR findings to suggest multicentric or contralateral malignancy.   02/12/2015 Clinical Stage   Stage IA: T1b N0   03/21/2015  Definitive Surgery   Right breast lumpectomy / SLNB Dalbert Batman): Invasive ductal carcinoma, grade 1, 1.1 cm, ER+ (100%), PR+ (73%), HER-2 negative (ratio 1.3), Ki67 6%, negative margins / no lymphovascular invasion; DCIS.  One lymph node negative for tumor (0/1).     03/21/2015 Oncotype testing   RS 7, low risk. (ROR 5% for 10-year recurrence with Tamoxifen alone).    03/21/2015 Pathologic Stage   Stage IA: pT1c pN0   05/07/2015 - 06/23/2015 Radiation Therapy   Adjuvant RT completed Pablo Ledger): Right breast 45 Gy over 25 fractions.  Right breast boost 16 Gy over 8 fractions. Total dose: 60 Gy.   06/02/2015 - 05/2020 Anti-estrogen oral therapy   Aromasin 25 mg once daily Burr Medico).  Planned duration of therapy: 5 years, completed in 05/2020.    08/15/2015 Survivorship   Survivorship visit completed and copy of survivorship care plan provided to patient.   01/31/2018 Mammogram   IMPRESSION: No mammographic evidence for malignancy.     CURRENT THERAPY: S/p anti-estrogen with Aromasin16m daily, completed 06/02/2015 - 05/2020   INTERVAL HISTORY: Ms. IKanitzreturns for follow up as scheduled. She was last seen for routine surveillance visit 12/31/19.  Mammogram in 01/2020 was negative/normal. DEXA in 4/21 showed osteopenia, she continues calcium and vitamin D.  Denies any other changes in her health in the last year.  She has left shoulder pain and arthritic joint pains in the knees and feet, stable over 3 years.  Pain did not improve when she stopped exemestane in July. Denies new bone pain. Denies concerns  in her breast such as new lump/mass, nipple discharge or inversion, or skin change. Denies recent fever, chills, cough, chest pain, dyspnea, change in bowel habits, abdominal pain, or new concerns.    MEDICAL HISTORY:  Past Medical History:  Diagnosis Date  . Anemia   . Arthritis   . Asthma   . Blood transfusion without reported diagnosis    Patients believes 2015 when she overdosed on "medications"    . Breast cancer of lower-outer quadrant of right female breast ([place]) 02/13/2015  . Cataract   . CHF, acute ([place]) 05/03/2012  . Depression   . Diabetes mellitus   . Eczema   . Fatty liver    Fatty infiltration of liver noted on 03/2012 CT scan  . Fibromyalgia   . Glaucoma   . Heart disease   . History of kidney stones    w/ hx of hydronephrosis - followed by [facility]  . HIV nonspecific serology    2006: indeterminate HIV blood test, seen by ID, felt secondary to cross reacting antibodies with no further workup felt necessary at that time   . Hypertension   . Obesity   . Personal history of radiation therapy 2016   right  . Radiation 05/07/15-06/23/15   Right Breast    SURGICAL HISTORY: Past Surgical History:  Procedure Laterality Date  . BREAST BIOPSY Left 02/10/2015   malignant   . BREAST LUMPECTOMY Right 03/21/2015  . CHOLECYSTECTOMY  2003  . CYSTOSCOPY W/ LITHOLAPAXY / EHL    . JOINT REPLACEMENT     bilateral knee replacement  . LEFT AND RIGHT HEART CATHETERIZATION WITH CORONARY ANGIOGRAM N/A 05/02/2012   Procedure: LEFT AND RIGHT HEART CATHETERIZATION WITH CORONARY ANGIOGRAM;  Surgeon: Burnell Blanks, MD;  Location: [facility] CATH LAB;  Service: Cardiovascular;  Laterality: N/A;  . RADIOACTIVE SEED GUIDED PARTIAL MASTECTOMY WITH AXILLARY SENTINEL LYMPH NODE BIOPSY Right 03/21/2015   Procedure: RIGHT  PARTIAL MASTECTOMY WITH RADIOACTIVE SEED LOCALIZATION RIGHT  AXILLARY SENTINEL LYMPH NODE BIOPSY;  Surgeon: Fanny Skates, MD;  Location: [place];  Service: General;  Laterality: Right;  . REPLACEMENT TOTAL KNEE BILATERAL  2005 &2006  . VASCULAR SURGERY Right 03/15/2013   Ultrasound guided sclerotherapy    I have reviewed the social history and family history with the patient and they are unchanged from previous note.  ALLERGIES:  has No Known Allergies.  MEDICATIONS:  Current Outpatient Medications  Medication Sig Dispense Refill  . acetaminophen  (TYLENOL) 650 MG CR tablet Take 650 mg by mouth every 8 (eight) hours as needed for pain.    Marland Kitchen allopurinol (ZYLOPRIM) 100 MG tablet TAKE 1 TABLET BY MOUTH  DAILY 90 tablet 3  . atorvastatin (LIPITOR) 40 MG tablet TAKE 1 TABLET BY MOUTH  DAILY 90 tablet 3  . BD AUTOSHIELD DUO 30G X 5 MM MISC USE ONCE DAILY TO INJECT  INSULIN AS DIRECTED 90 each 3  . Blood Glucose Monitoring Suppl (ONETOUCH VERIO FLEX SYSTEM) w/Device KIT USE AS DIRECTED 1 kit 0  . colchicine 0.6 MG tablet TAKE 1 TABLET BY MOUTH  DAILY 90 tablet 3  . dorzolamide (TRUSOPT) 2 % ophthalmic solution 1 drop daily.    . dorzolamide-timolol (COSOPT) 22.3-6.8 MG/ML ophthalmic solution Place 1 drop into both eyes 2 (two) times daily.  0  . empagliflozin (JARDIANCE) 25 MG TABS tablet Take 1 tablet (25 mg total) by mouth daily. 90 tablet 3  . FLOVENT HFA 110 MCG/ACT inhaler USE 2 INHALATIONS BY MOUTH  TWICE DAILY 36 g 3  . furosemide (LASIX) 20 MG tablet Take by mouth.    . gabapentin (NEURONTIN) 100 MG capsule TAKE 2 CAPSULES BY MOUTH bid 540 capsule 1  . Insulin Pen Needle (PEN NEEDLES 5/16") 30G X 8 MM MISC Use to inject insulin as directed.  Dx code:E11.69 90 each 0  . Lancet Devices (ONETOUCH DELICA PLUS LANCING) MISC USE ONCE DAILY 100 each 2  . latanoprost (XALATAN) 0.005 % ophthalmic solution Place 1 drop into both eyes at bedtime.   0  . losartan (COZAAR) 100 MG tablet Take 0.5 tablets (50 mg total) by mouth at bedtime. 90 tablet 3  . meloxicam (MOBIC) 7.5 MG tablet Take 1 tablet (7.5 mg total) by mouth daily. 30 tablet 0  . metFORMIN (GLUCOPHAGE-XR) 500 MG 24 hr tablet TAKE 1 TABLET BY MOUTH  TWICE DAILY 180 tablet 3  . metoprolol succinate (TOPROL-XL) 25 MG 24 hr tablet TAKE 1 TABLET BY MOUTH  DAILY 90 tablet 3  . Miconazole Nitrate (LOTRIMIN AF) 2 % AERO Spray between toes once daily for 4 weeks. 150 g 1  . neomycin (MYCIFRADIN) 500 MG tablet Take by mouth 3 (three) times daily.    Marland Kitchen neomycin-polymyxin b-dexamethasone (MAXITROL)  3.5-10000-0.1 SUSP     . OneTouch Delica Lancets 24M MISC 1 Container by Does not apply route daily as needed. 100 each prn  . ONETOUCH VERIO test strip CHECK BLOOD SUGAR IN THE  MORNING AS DIRECTED 100 strip 3  . spironolactone (ALDACTONE) 25 MG tablet TAKE 1 TABLET BY MOUTH  DAILY 90 tablet 3  . VICTOZA 18 MG/3ML SOPN INJECT 1.8 MG (0.3 ML)  SUBCUTANEOUSLY DAILY 18 mL 5   No current facility-administered medications for this visit.    PHYSICAL EXAMINATION:  Vitals:   12/30/20 1207  BP: (!) 157/86  Pulse: 76  Resp: 15  Temp: 97.7 F (36.5 C)  SpO2: 100%   Filed Weights   12/30/20 1207  Weight: 217 lb 9.6 oz (98.7 kg)    GENERAL:alert, no distress and comfortable SKIN: no rash  EYES: sclera clear NECK: without mass LYMPH:  no palpable cervical or supraclavicular lymphadenopathy  LUNGS:  normal breathing effort Musculoskeletal: decreased left shoulder ROM. Soft tissue prominence over the left upper back/shoulder without mass, nodularity, or tenderness  NEURO: alert & oriented x 3 with fluent speech Breast exam: breast are asymmetric without nipple discharge or inversion. S/p right lumpectomy. Incisions completely healed. No palpable mass in either breast or axilla that I could appreciate   LABORATORY DATA:  I have reviewed the data as listed CBC Latest Ref Rng & Units 12/30/2020 12/31/2019 06/07/2019  WBC 4.0 - 10.5 K/uL 10.4 9.8 9.9  Hemoglobin 12.0 - 15.0 g/dL 12.3 12.4 11.9(L)  Hematocrit 36.0 - 46.0 % 40.5 39.7 38.7  Platelets 150 - 400 K/uL 226 177 150     CMP Latest Ref Rng & Units 12/30/2020 08/05/2020 12/31/2019  Glucose 70 - 99 mg/dL 120(H) 159(H) 171(H)  BUN 8 - 23 mg/dL 21 31(H) 32(H)  Creatinine 0.44 - 1.00 mg/dL 1.14(H) 1.16(H) 1.45(H)  Sodium 135 - 145 mmol/L 140 142 139  Potassium 3.5 - 5.1 mmol/L 4.9 4.2 4.7  Chloride 98 - 111 mmol/L 110 104 105  CO2 22 - 32 mmol/L 20(L) 16(L) 22  Calcium 8.9 - 10.3 mg/dL 9.4 9.2 9.5  Total Protein 6.5 - 8.1 g/dL 8.1 7.1 7.8   Total Bilirubin 0.3 - 1.2 mg/dL 0.6 0.4 0.5  Alkaline Phos 38 -  126 U/L 120 121 120  AST 15 - 41 U/L 27 16 17   ALT 0 - 44 U/L 13 12 16       RADIOGRAPHIC STUDIES: I have personally reviewed the radiological images as listed and agreed with the findings in the report. No results found.   ASSESSMENT & PLAN: 70 yo female   1. Breast cancer of lower-outer quadrant of right breast, invasive ductal carcinoma, pT1c N0 M0, stage IA, ER positive PR positive HER-2 negative, Ki 67 6%, and DCIS -Diagnosed 01/2015, s/p right lumpectomy and SLNB and adjuvant radiation. oncotype showed low risk, no need for adjuvant chemo.  -she completed antiestrogen therapy with exemestane from 05/2015 - 05/2020, tolerated well  -on surveillance  -mammograms annually in March  2. Bone health -Her bone density scan in 08/2015 was normal. DEXA from 3/5/19normal withT Score of -0.9, and decreaesd to -1.7 at forearm radius in 02/2020 -continue calcium and vitamin D. She is off AI now.    Disposition:  [name] is clinically doing well. She completed 5 years anti-estrogen therapy in 05/2020. She tolerated well overall. Breast exam is benign. Last mammogram 01/2020 negative/normal. CBC is normal, CMP stable with mild renal dysfunction. Overall no clinical concern for breast cancer recurrence.   She is over 5 years from initial diagnosis, the recurrence risk is minimal. She will continue annual surveillance with [place]. Next screening mammogram in 01/2021 and lab/follow up in 12/2021.    Orders Placed This Encounter  Procedures  . MM 3D SCREEN BREAST BILATERAL    Standing Status:   Future    Standing Expiration Date:   12/30/2021    Order Specific Question:   Reason for Exam (SYMPTOM  OR DIAGNOSIS REQUIRED)    Answer:   h/o right breast cancer 01/2015 s/p lumpectomy, RT, and AI    Order Specific Question:   Preferred imaging location?    Answer:   [facility]   All questions were answered. The patient knows to call  the clinic with any problems, questions or concerns. No barriers to learning were detected.     Alla Feeling, NP 12/30/20

## 2020-12-29 NOTE — Progress Notes (Signed)
  Subjective:  Patient ID: [name], female    DOB: [birth year],  MRN: [id number]  Chief Complaint  Patient presents with  . Plantar Fasciitis    Pt states bilateral heel pain is worse    70 y.o. female presents with the above complaint. History confirmed with patient.   Objective:  Physical Exam: warm, good capillary refill, no trophic changes or ulcerative lesions, normal DP and PT pulses and normal sensory exam. Severe pes planus Left Foot: tenderness to palpation medial calcaneal tuber, no pain with calcaneal squeeze, decreased ankle joint ROM and +Silverskiold test Right Foot: tenderness to palpation medial calcaneal tuber, no pain with calcaneal squeeze, decreased ankle joint ROM and +Silverskiold test  Assessment:   1. Plantar fasciitis      Plan:  Patient was evaluated and treated and all questions answered.  Plantar Fasciitis  -Patient continues to have pain. -Refer to physical therapy -Dispensed plantar fascial brace right   No follow-ups on file.

## 2020-12-30 ENCOUNTER — Inpatient Hospital Stay: Payer: Medicare Other

## 2020-12-30 ENCOUNTER — Encounter: Payer: Self-pay | Admitting: Nurse Practitioner

## 2020-12-30 ENCOUNTER — Inpatient Hospital Stay: Payer: Medicare Other | Attending: Nurse Practitioner | Admitting: Nurse Practitioner

## 2020-12-30 ENCOUNTER — Other Ambulatory Visit: Payer: Self-pay

## 2020-12-30 VITALS — BP 157/86 | HR 76 | Temp 97.7°F | Resp 15 | Ht 62.0 in | Wt 217.6 lb

## 2020-12-30 DIAGNOSIS — I110 Hypertensive heart disease with heart failure: Secondary | ICD-10-CM | POA: Insufficient documentation

## 2020-12-30 DIAGNOSIS — I509 Heart failure, unspecified: Secondary | ICD-10-CM | POA: Insufficient documentation

## 2020-12-30 DIAGNOSIS — C50511 Malignant neoplasm of lower-outer quadrant of right female breast: Secondary | ICD-10-CM

## 2020-12-30 DIAGNOSIS — Z79899 Other long term (current) drug therapy: Secondary | ICD-10-CM | POA: Diagnosis not present

## 2020-12-30 DIAGNOSIS — M8580 Other specified disorders of bone density and structure, unspecified site: Secondary | ICD-10-CM | POA: Insufficient documentation

## 2020-12-30 DIAGNOSIS — Z170 Estrogen receptor positive status [ER+]: Secondary | ICD-10-CM | POA: Diagnosis not present

## 2020-12-30 DIAGNOSIS — Z923 Personal history of irradiation: Secondary | ICD-10-CM | POA: Diagnosis not present

## 2020-12-30 DIAGNOSIS — E119 Type 2 diabetes mellitus without complications: Secondary | ICD-10-CM | POA: Diagnosis not present

## 2020-12-30 DIAGNOSIS — Z853 Personal history of malignant neoplasm of breast: Secondary | ICD-10-CM | POA: Insufficient documentation

## 2020-12-30 DIAGNOSIS — Z794 Long term (current) use of insulin: Secondary | ICD-10-CM | POA: Insufficient documentation

## 2020-12-30 DIAGNOSIS — E669 Obesity, unspecified: Secondary | ICD-10-CM | POA: Diagnosis not present

## 2020-12-30 LAB — CBC WITH DIFFERENTIAL/PLATELET
Abs Immature Granulocytes: 0.07 10*3/uL (ref 0.00–0.07)
Basophils Absolute: 0.1 10*3/uL (ref 0.0–0.1)
Basophils Relative: 1 %
Eosinophils Absolute: 0.2 10*3/uL (ref 0.0–0.5)
Eosinophils Relative: 2 %
HCT: 40.5 % (ref 36.0–46.0)
Hemoglobin: 12.3 g/dL (ref 12.0–15.0)
Immature Granulocytes: 1 %
Lymphocytes Relative: 32 %
Lymphs Abs: 3.3 10*3/uL (ref 0.7–4.0)
MCH: 29.9 pg (ref 26.0–34.0)
MCHC: 30.4 g/dL (ref 30.0–36.0)
MCV: 98.5 fL (ref 80.0–100.0)
Monocytes Absolute: 0.8 10*3/uL (ref 0.1–1.0)
Monocytes Relative: 8 %
Neutro Abs: 6 10*3/uL (ref 1.7–7.7)
Neutrophils Relative %: 56 %
Platelets: 226 10*3/uL (ref 150–400)
RBC: 4.11 MIL/uL (ref 3.87–5.11)
RDW: 13.5 % (ref 11.5–15.5)
WBC: 10.4 10*3/uL (ref 4.0–10.5)
nRBC: 0 % (ref 0.0–0.2)

## 2020-12-30 LAB — CMP (CANCER CENTER ONLY)
ALT: 13 U/L (ref 0–44)
AST: 27 U/L (ref 15–41)
Albumin: 3.9 g/dL (ref 3.5–5.0)
Alkaline Phosphatase: 120 U/L (ref 38–126)
Anion gap: 10 (ref 5–15)
BUN: 21 mg/dL (ref 8–23)
CO2: 20 mmol/L — ABNORMAL LOW (ref 22–32)
Calcium: 9.4 mg/dL (ref 8.9–10.3)
Chloride: 110 mmol/L (ref 98–111)
Creatinine: 1.14 mg/dL — ABNORMAL HIGH (ref 0.44–1.00)
GFR, Estimated: 51 mL/min — ABNORMAL LOW (ref >60)
Glucose, Bld: 120 mg/dL — ABNORMAL HIGH (ref 70–99)
Potassium: 4.9 mmol/L (ref 3.5–5.1)
Sodium: 140 mmol/L (ref 135–145)
Total Bilirubin: 0.6 mg/dL (ref 0.3–1.2)
Total Protein: 8.1 g/dL (ref 6.5–8.1)

## 2020-12-31 ENCOUNTER — Other Ambulatory Visit: Payer: Self-pay

## 2020-12-31 ENCOUNTER — Ambulatory Visit: Payer: Medicare Other | Attending: Podiatry | Admitting: Physical Therapy

## 2020-12-31 ENCOUNTER — Encounter: Payer: Self-pay | Admitting: Physical Therapy

## 2020-12-31 ENCOUNTER — Telehealth: Payer: Self-pay | Admitting: Nurse Practitioner

## 2020-12-31 VITALS — BP 164/94 | HR 77

## 2020-12-31 DIAGNOSIS — M79672 Pain in left foot: Secondary | ICD-10-CM | POA: Diagnosis not present

## 2020-12-31 DIAGNOSIS — M25675 Stiffness of left foot, not elsewhere classified: Secondary | ICD-10-CM | POA: Insufficient documentation

## 2020-12-31 DIAGNOSIS — R2681 Unsteadiness on feet: Secondary | ICD-10-CM | POA: Diagnosis not present

## 2020-12-31 DIAGNOSIS — M6281 Muscle weakness (generalized): Secondary | ICD-10-CM | POA: Insufficient documentation

## 2020-12-31 DIAGNOSIS — M79671 Pain in right foot: Secondary | ICD-10-CM | POA: Diagnosis not present

## 2020-12-31 DIAGNOSIS — M25674 Stiffness of right foot, not elsewhere classified: Secondary | ICD-10-CM | POA: Insufficient documentation

## 2020-12-31 DIAGNOSIS — R2689 Other abnormalities of gait and mobility: Secondary | ICD-10-CM | POA: Insufficient documentation

## 2020-12-31 NOTE — Patient Instructions (Signed)
Access Code: [email or web address]  URL: [email or web address]  Date: 12/31/2020  Prepared by: Hilda Blades  Exercises 0Heel Toe Raises with Counter Support - 1 x daily - 7 x weekly - 3 sets - 10-15 reps  -Sit to Stand with Armchair - 1 x daily - 7 x weekly - 3 sets - 10 reps  -Standing Romberg to 1/2 Tandem Stance - 1 x daily - 7 x weekly - 3 reps - 30 seconds hold  -Standing Gastroc Stretch at Counter - 2-3 x daily - 7 x weekly - 3 reps - 30 seconds hold

## 2020-12-31 NOTE — Addendum Note (Signed)
Addended by: Bobette Mo on: 12/31/2020 05:02 PM   Modules accepted: Orders

## 2020-12-31 NOTE — Telephone Encounter (Signed)
Scheduled appts per 2/1 los. Pt's voicemail box was full. Mailed appt reminder and calendar.

## 2020-12-31 NOTE — Therapy (Signed)
[place], [place], [address] Phone: [phone]   Fax:  [phone]  Physical Therapy Evaluation  Patient Details  Name: [name] MRN: [id number] Date of Birth: 1951-07-10 Referring Provider (PT): Evelina Bucy, DPM   Encounter Date: 12/31/2020   PT End of Session - 12/31/20 1127    Visit Number 1    Number of Visits 16    Date for PT Re-Evaluation 02/25/21    Authorization Type [facility] MCR/ MCD 2022    Authorization Time Period FOTO by 6th visit, kx by 15    Progress Note Due on Visit 10    PT Start Time 0920    PT Stop Time 1018    PT Time Calculation (min) 58 min    Activity Tolerance Patient tolerated treatment well    Behavior During Therapy [facility] for tasks assessed/performed           Past Medical History:  Diagnosis Date  . Anemia   . Arthritis   . Asthma   . Blood transfusion without reported diagnosis    Patients believes 2015 when she overdosed on "medications"   . Breast cancer of lower-outer quadrant of right female breast ([place]) 02/13/2015  . Cataract   . CHF, acute ([place]) 05/03/2012  . Depression   . Diabetes mellitus   . Eczema   . Fatty liver    Fatty infiltration of liver noted on 03/2012 CT scan  . Fibromyalgia   . Glaucoma   . Heart disease   . History of kidney stones    w/ hx of hydronephrosis - followed by [facility]  . HIV nonspecific serology    2006: indeterminate HIV blood test, seen by ID, felt secondary to cross reacting antibodies with no further workup felt necessary at that time   . Hypertension   . Obesity   . Personal history of radiation therapy 2016   right  . Radiation 05/07/15-06/23/15   Right Breast    Past Surgical History:  Procedure Laterality Date  . BREAST BIOPSY Left 02/10/2015   malignant   . BREAST LUMPECTOMY Right 03/21/2015  . CHOLECYSTECTOMY  2003  . CYSTOSCOPY W/ LITHOLAPAXY / EHL    . JOINT REPLACEMENT     bilateral knee  replacement  . LEFT AND RIGHT HEART CATHETERIZATION WITH CORONARY ANGIOGRAM N/A 05/02/2012   Procedure: LEFT AND RIGHT HEART CATHETERIZATION WITH CORONARY ANGIOGRAM;  Surgeon: Burnell Blanks, MD;  Location: [facility] CATH LAB;  Service: Cardiovascular;  Laterality: N/A;  . RADIOACTIVE SEED GUIDED PARTIAL MASTECTOMY WITH AXILLARY SENTINEL LYMPH NODE BIOPSY Right 03/21/2015   Procedure: RIGHT  PARTIAL MASTECTOMY WITH RADIOACTIVE SEED LOCALIZATION RIGHT  AXILLARY SENTINEL LYMPH NODE BIOPSY;  Surgeon: Fanny Skates, MD;  Location: [place];  Service: General;  Laterality: Right;  . REPLACEMENT TOTAL KNEE BILATERAL  2005 &2006  . VASCULAR SURGERY Right 03/15/2013   Ultrasound guided sclerotherapy    Vitals:   12/31/20 0938  BP: (!) 164/94  Pulse: 77      Subjective Assessment - 12/31/20 0923    Subjective Pt states that plantar fasciitis is worse R>L. Since ending her last episode of PT, [place] had to be helped getting up. As for the feet, she has bought a couple of braces for plantar fasciitis but without relief. She has sharp pain when she first steps out of bed in the morning.  She has orthotics in her shoes, currently not wearing them, and they help some but not a lot. The orthotist is making her a brace currently for her feet (not sure if its for her balance or plantar fasciitis). She has not tried night splints for her plantar fasciitis.    Pertinent History Diabetes, arthritis, flat feet    Limitations Standing;Walking;Lifting;House hold activities    How long can you sit comfortably? n/a    How long can you stand comfortably? 20 minutes    How long can you walk comfortably? about a block    Patient Stated Goals stand up longer, be able to move and walk, scared to walk by herself.    Currently in Pain? Yes    Pain Score 8     Pain Location Foot    Pain Orientation Right;Left   near heel spur, on lateral part  of foot, and at medial tubercle   Pain Descriptors / Indicators Aching;Sharp    Pain Type Chronic pain    Pain Onset More than a month ago    Pain Frequency Constant    Aggravating Factors  standing, lots of walking,    Pain Relieving Factors none              [facility] PT Assessment - 12/31/20 0001      Assessment   Medical Diagnosis Plantar fasciitis bilateral    Referring Provider (PT) Evelina Bucy, DPM    Onset Date/Surgical Date --   ongoing > 6 months   Next MD Visit --   when the custom braces come in she will return to Dr. March Rummage   Prior Therapy PT for balance that ended Dec 2021      Balance Screen   Has the patient fallen in the past 6 months Yes    Has the patient had a decrease in activity level because of a fear of falling?  Yes    Is the patient reluctant to leave their home because of a fear of falling?  Yes      [place] - single point;Walker - 4 wheels      Cognition   Overall Cognitive Status Within Functional Limits for tasks assessed      Observation/Other Assessments   Focus on Therapeutic Outcomes (FOTO)  49      ROM / Strength   AROM / PROM / Strength AROM;PROM;Strength      AROM   AROM Assessment Site Ankle    Right/Left Ankle Right;Left    Right Ankle Dorsiflexion 2    Right Ankle Plantar Flexion 18    Right Ankle Inversion 18    Right Ankle Eversion 32    Left Ankle Dorsiflexion 2    Left Ankle Plantar Flexion 22    Left Ankle Inversion 26    Left Ankle Eversion 32      Strength   Overall Strength Within functional limits for tasks performed    Strength Assessment Site Ankle    Right/Left Ankle Right;Left    Right Ankle Dorsiflexion 5/5    Right Ankle Plantar Flexion 5/5    Right Ankle Inversion 5/5    Right Ankle Eversion 5/5    Left Ankle Dorsiflexion 5/5    Left Ankle  Plantar Flexion 5/5    Left Ankle Inversion  5/5    Left Ankle Eversion 5/5      Palpation   Palpation comment exquisite tenderness bilateral posterior calcaneus, near sinus tarsus, and medial tubercle      Balance   Balance Assessed Yes      Static Standing Balance   Static Standing - Balance Support No upper extremity supported    Static Standing - Level of Assistance 5: Stand by assistance    Static Standing Balance -  Activities  Romberg - Eyes Closed;Romberg - Eyes Opened   semi-tandem eyes opened   Static Standing - Comment/# of Minutes --   romberg eyes open and closed- 30 seconds, semi-tandem eyes opened: 30 seconds                     Objective measurements completed on examination: See above findings.       [place] Adult PT Treatment/Exercise - 12/31/20 0001      Balance   Balance Assessed Yes      Static Standing Balance   Static Standing - Balance Support No upper extremity supported    Static Standing - Level of Assistance 5: Stand by assistance    Tandem Stance - Right Leg --   semi-tandem x 30 seconds   Tandem Stance - Left Leg --   semi-tandem x30 seconds   Rhomberg - Eyes Opened --   x30 seconds   Rhomberg - Eyes Closed --   x30 seconds     Dynamic Standing Balance   Dynamic Standing - Comments Heel to raises with support   cued to go further into plantarflexion     Self-Care   Other Self-Care Comments  reviewed previous HEP and condensed HEP                  PT Education - 12/31/20 1126    Education Details HEP, sx explanation, POC, dx    Person(s) Educated Patient    Methods Explanation;Demonstration;Tactile cues;Verbal cues;Handout    Comprehension Verbalized understanding;Need further instruction            PT Short Term Goals - 12/31/20 1559      PT SHORT TERM GOAL #1   Title Pt will be independent with initial HEP    Time 4    Period Weeks    Status New    Target Date 01/28/21      PT SHORT TERM GOAL #2   Title PT  will go over FOTO results with pt.    Time 4    Period Weeks    Status New    Target Date 01/28/21             PT Long Term Goals - 12/31/20 1601      PT LONG TERM GOAL #1   Title Pt will be independent with advanced HEP    Time 6    Period Weeks    Status New    Target Date 02/11/21      PT LONG TERM GOAL #2   Title Establish balance goal once standardized balance assessment performed    Time 6    Period Weeks    Status New      PT LONG TERM GOAL #3   Title Pt will improve bilateral dorsiflexion to 10 degrees in order to decrease pain in bilateral feet.    Time 6    Period Weeks    Status New      PT LONG TERM GOAL #4  Title Pt will improve FOTO from 49% to predicted 60% in order to show functional improvement.    Time 6    Period Weeks    Status New    Target Date 02/11/21                  Plan - 12/31/20 1145    Clinical Impression Statement Pt is a 70 y/o F presenting to PT with complaints of bilateral foot pain in plantar aspects of the feet. Examination revealed significant foot edema, bilateral bunions, and pain at the medial tubercles, near the tarsal sinus, and the posteroir surface of the calcaneus. Limited dorsiflexion, plantarflexion, and R>L inversion ROM was also noticeable, as well as significant balance impairements with narrowed BOS and with decreased visual input. Pt would benefit from skilled PT in order to increase ankle mobility, decrease fascial restriction, and increase balance in order to increase safe community ambulation, decrease fall risk, and decrease LE pain.    Personal Factors and Comorbidities Time since onset of injury/illness/exacerbation;Comorbidity 1;Comorbidity 2;Past/Current Experience    Comorbidities DM II, essential HTN, gout, Charcot foot, anemia, depression, fibromyalgia, arthritis    Examination-Activity Limitations Stairs;Stand;Locomotion Level;Bend;Lift;Squat;Transfers;Reach Overhead    Examination-Participation  Restrictions Community Activity;Laundry;Meal Prep;Cleaning;Shop;Volunteer;Yard Work    Stability/Clinical Decision Making Stable/Uncomplicated    Clinical Decision Making Low    Rehab Potential Good    PT Frequency 2x / week    PT Duration 8 weeks    PT Treatment/Interventions Electrical Stimulation;Iontophoresis 4mg /ml Dexamethasone;Moist Heat;Cryotherapy;Ultrasound;Gait training;Stair training;Functional mobility training;Therapeutic activities;Therapeutic exercise;Balance training;Neuromuscular re-education;Patient/family education;Manual techniques;Taping;Dry needling;Vasopneumatic Device    PT Next Visit Plan work on ankle ROM (stretching, mobs), soft tissue manual therapy to plantar fascia, gastroc/soleus strenghtening, balance, perform Berg/walking balance outcome    PT Home Exercise Plan [email or web address]    Consulted and Agree with Plan of Care Patient           Patient will benefit from skilled therapeutic intervention in order to improve the following deficits and impairments:  Decreased balance,Decreased mobility,Difficulty walking,Decreased activity tolerance,Decreased strength,Abnormal gait,Decreased range of motion,Cardiopulmonary status limiting activity,Decreased endurance,Decreased safety awareness,Pain,Impaired flexibility,Increased edema,Increased fascial restricitons  Visit Diagnosis: Pain in left foot  Pain in right foot  Stiffness of left foot, not elsewhere classified  Stiffness of right foot, not elsewhere classified  Other abnormalities of gait and mobility  Muscle weakness (generalized)  Unsteadiness on feet     Problem List Patient Active Problem List   Diagnosis Date Noted  . Tinea pedis 04/09/2020  . Spinal stenosis of lumbar region without neurogenic claudication 02/08/2019  . Nephropathy, diabetic ([place]) 02/07/2019  . Gout 05/16/2018  . Gait instability 01/03/2018  . Frequent bowel movements 11/02/2017  . Tremor of both hands   . Stuttering   .  Essential hypertension   . Hyperlipidemia 08/13/2015  . Breast cancer of lower-outer quadrant of right female breast ([place]) 02/13/2015  . Vitamin D deficiency 02/06/2015  . Type 2 diabetes mellitus with diabetic foot deformity ([place]) 01/01/2014  . Chronic combined systolic and diastolic heart failure ([place]) 02/26/2013  . OBESITY 02/05/2009  . Asthma, intermittent 02/08/2008  . NEPHROLITHIASIS 01/26/2007  . DJD (degenerative joint disease), multiple sites 01/26/2007    [name] 12/31/2020, 4:38 PM  [facility] [address] [place], [place], [address] Phone: [phone]   Fax:  [phone]  Name: [name] MRN: [id number] Date of Birth: December 20, 1950

## 2021-01-05 ENCOUNTER — Other Ambulatory Visit: Payer: Self-pay | Admitting: Family Medicine

## 2021-01-14 ENCOUNTER — Encounter: Payer: Self-pay | Admitting: Physical Therapy

## 2021-01-14 ENCOUNTER — Ambulatory Visit: Payer: Medicare Other | Admitting: Physical Therapy

## 2021-01-14 ENCOUNTER — Other Ambulatory Visit: Payer: Self-pay

## 2021-01-14 DIAGNOSIS — M6281 Muscle weakness (generalized): Secondary | ICD-10-CM | POA: Diagnosis not present

## 2021-01-14 DIAGNOSIS — R2681 Unsteadiness on feet: Secondary | ICD-10-CM | POA: Diagnosis not present

## 2021-01-14 DIAGNOSIS — M25674 Stiffness of right foot, not elsewhere classified: Secondary | ICD-10-CM

## 2021-01-14 DIAGNOSIS — M25675 Stiffness of left foot, not elsewhere classified: Secondary | ICD-10-CM

## 2021-01-14 DIAGNOSIS — M79672 Pain in left foot: Secondary | ICD-10-CM

## 2021-01-14 DIAGNOSIS — R2689 Other abnormalities of gait and mobility: Secondary | ICD-10-CM

## 2021-01-14 DIAGNOSIS — M79671 Pain in right foot: Secondary | ICD-10-CM

## 2021-01-14 NOTE — Therapy (Addendum)
[place], [place], [address] Phone: [phone]   Fax:  [phone]  Physical Therapy Treatment  Patient Details  Name: [name] MRN: [id number] Date of Birth: February 13, 1951 Referring Provider (PT): Evelina Bucy, DPM   Encounter Date: 01/14/2021   PT End of Session - 01/14/21 0914    Visit Number 2    Number of Visits 16    Date for PT Re-Evaluation 02/25/21    Authorization Type [facility] MCR/ MCD 2022    Authorization Time Period FOTO by 6th visit, kx by 15    PT Start Time 0915    PT Stop Time 1000    PT Time Calculation (min) 45 min    Equipment Utilized During Treatment Gait belt    Activity Tolerance Patient tolerated treatment well    Behavior During Therapy [facility] for tasks assessed/performed           Past Medical History:  Diagnosis Date  . Anemia   . Arthritis   . Asthma   . Blood transfusion without reported diagnosis    Patients believes 2015 when she overdosed on "medications"   . Breast cancer of lower-outer quadrant of right female breast ([place]) 02/13/2015  . Cataract   . CHF, acute ([place]) 05/03/2012  . Depression   . Diabetes mellitus   . Eczema   . Fatty liver    Fatty infiltration of liver noted on 03/2012 CT scan  . Fibromyalgia   . Glaucoma   . Heart disease   . History of kidney stones    w/ hx of hydronephrosis - followed by [facility]  . HIV nonspecific serology    2006: indeterminate HIV blood test, seen by ID, felt secondary to cross reacting antibodies with no further workup felt necessary at that time   . Hypertension   . Obesity   . Personal history of radiation therapy 2016   right  . Radiation 05/07/15-06/23/15   Right Breast    Past Surgical History:  Procedure Laterality Date  . BREAST BIOPSY Left 02/10/2015   malignant   . BREAST LUMPECTOMY Right 03/21/2015  . CHOLECYSTECTOMY  2003  . CYSTOSCOPY W/ LITHOLAPAXY / EHL    . JOINT REPLACEMENT      bilateral knee replacement  . LEFT AND RIGHT HEART CATHETERIZATION WITH CORONARY ANGIOGRAM N/A 05/02/2012   Procedure: LEFT AND RIGHT HEART CATHETERIZATION WITH CORONARY ANGIOGRAM;  Surgeon: Burnell Blanks, MD;  Location: [facility] CATH LAB;  Service: Cardiovascular;  Laterality: N/A;  . RADIOACTIVE SEED GUIDED PARTIAL MASTECTOMY WITH AXILLARY SENTINEL LYMPH NODE BIOPSY Right 03/21/2015   Procedure: RIGHT  PARTIAL MASTECTOMY WITH RADIOACTIVE SEED LOCALIZATION RIGHT  AXILLARY SENTINEL LYMPH NODE BIOPSY;  Surgeon: Fanny Skates, MD;  Location: [place];  Service: General;  Laterality: Right;  . REPLACEMENT TOTAL KNEE BILATERAL  2005 &2006  . VASCULAR SURGERY Right 03/15/2013   Ultrasound guided sclerotherapy    There were no vitals filed for this visit.   Subjective Assessment - 01/14/21 0918    Subjective Nothing new since last visit, exercises have been going well. Hasn't been doing the balance exercises too much because she needs to find a place to hold onto. The podiatrist called saying that they had her orthotic boots ready and that she has an appointment on the 28th to have them picked up. She has had some stumbles since last visit, but no falls.    Currently in Pain? Yes  Pain Score 8     Pain Location Foot    Pain Orientation Right;Left    Pain Descriptors / Indicators Aching;Sharp    Pain Type Chronic pain    Pain Onset More than a month ago    Pain Frequency Constant              [facility] PT Assessment - 01/14/21 0001      Standardized Balance Assessment   Standardized Balance Assessment Berg Balance Test      Berg Balance Test   Sit to Stand Able to stand  independently using hands    Standing Unsupported Able to stand 2 minutes with supervision    Sitting with Back Unsupported but Feet Supported on Floor or Stool Able to sit safely and securely 2 minutes    Stand to Sit Sits safely with minimal use of hands    Transfers Able to transfer safely, minor use  of hands    Standing Unsupported with Eyes Closed Able to stand 10 seconds with supervision    Standing Unsupported with Feet Together Able to place feet together independently and stand for 1 minute with supervision   held for 45 sec with LOB   From Standing, Reach Forward with Outstretched Arm Loses balance while trying/requires external support    From Standing Position, Pick up Object from Floor Unable to try/needs assist to keep balance    From Standing Position, Turn to Look Behind Over each Shoulder Looks behind one side only/other side shows less weight shift    Turn 360 Degrees Needs assistance while turning    Standing Unsupported, Alternately Place Feet on Step/Stool Able to complete >2 steps/needs minimal assist    Standing Unsupported, One Foot in [employer] balance while stepping or standing    Standing on One Leg Unable to try or needs assist to prevent fall    Total Score 28    Berg comment: 28/56; <36, so indicates high risk for falls (close to 100%); score of 28 also indicates pt uses walker full time (which is true)                         [facility] Adult PT Treatment/Exercise - 01/14/21 0001      Static Standing Balance   Tandem Stance - Right Leg --   modified tandem, 2x30 seconds   Tandem Stance - Left Leg --   modified tanem, 2x30 seconds     Dynamic Standing Balance   Dynamic Standing - Comments heel to toe raises with support, x20   cued to go further into plantarflexion     Self-Care   Other Self-Care Comments  performing Berg Balance and discussing results; discussion on when to perform tennis ball rolls onto plantar fasciitis and calf stretches and the importance of being consistant      Knee/Hip Exercises: Stretches   Gastroc Stretch 2 reps;30 seconds    Gastroc Stretch Limitations standing runners stretch    Other Knee/Hip Stretches long sitting calf stretch with strap 2x30 seconds    Other Knee/Hip Stretches rolling a tennis ball x2 minutes  each side                  PT Education - 01/14/21 1106    Education Details HEP update, findings on Berg Balance test    Person(s) Educated Patient    Methods Explanation;Demonstration;Tactile cues;Verbal cues;Handout    Comprehension Verbalized understanding;Need further instruction  PT Short Term Goals - 12/31/20 1559      PT SHORT TERM GOAL #1   Title Pt will be independent with initial HEP    Time 4    Period Weeks    Status New    Target Date 01/28/21      PT SHORT TERM GOAL #2   Title PT will go over FOTO results with pt.    Time 4    Period Weeks    Status New    Target Date 01/28/21             PT Long Term Goals - 01/14/21 1111      PT LONG TERM GOAL #1   Title Pt will be independent with advanced HEP    Time 6    Period Weeks    Status New    Target Date 02/11/21      PT LONG TERM GOAL #2   Title Pt will achieve 34/56 on the Berg Balance test to show a detectable change with balance interventions and to decrease the pts risk for falls.    Baseline 28/56    Time 6    Period Weeks    Status New    Target Date 02/11/21      PT LONG TERM GOAL #3   Title Pt will improve bilateral dorsiflexion to 10 degrees in order to decrease pain in bilateral feet.    Time 6    Period Weeks    Status New    Target Date 02/11/21      PT LONG TERM GOAL #4   Title Pt will improve FOTO from 49% to predicted 60% in order to show functional improvement.    Time 6    Period Weeks    Status New    Target Date 02/11/21                 Plan - 01/14/21 1359    Clinical Impression Statement Pt tolerated session well with no adverse effects. Pt reports no change in sx since last visit. Berg Balance Test was at a 28/56, which revealed a score indicative of a high fall risk for this pt, pt had the hardest time with actions that required anticipatory postural adjustments, reaching outside her BOS, and narrowing her BOS. To address the plantar  fasciitis, further stretching and soft tissue work was done. Importance of daily compliance with soft tissue work to the plantar fascia and stretching the gastroc/soleus to help with her pain. Pt would continue to benefit from PT in order to increase ROM at the ankle and foot, increase foot intrinsic strength, and increase balance in order to help decrease bilateral foot pain, increase weightbearing tolerance, and decrease fall risk.    PT Treatment/Interventions Electrical Stimulation;Iontophoresis 4mg /ml Dexamethasone;Moist Heat;Cryotherapy;Ultrasound;Gait training;Stair training;Functional mobility training;Therapeutic activities;Therapeutic exercise;Balance training;Neuromuscular re-education;Patient/family education;Manual techniques;Taping;Dry needling;Vasopneumatic Device    PT Next Visit Plan work on ankle ROM (stretching, mobs), intrinsic foot strengthening, soft tissue manual therapy to plantar fascia, progress balance exercises,    PT Home Exercise Plan [email or web address]    Consulted and Agree with Plan of Care Patient           Patient will benefit from skilled therapeutic intervention in order to improve the following deficits and impairments:  Decreased balance,Decreased mobility,Difficulty walking,Decreased activity tolerance,Decreased strength,Abnormal gait,Decreased range of motion,Cardiopulmonary status limiting activity,Decreased endurance,Decreased safety awareness,Pain,Impaired flexibility,Increased edema,Increased fascial restricitons  Visit Diagnosis: Pain in left foot  Pain in right foot  Stiffness of  right foot, not elsewhere classified  Stiffness of left foot, not elsewhere classified  Other abnormalities of gait and mobility  Muscle weakness (generalized)  Unsteadiness on feet     Problem List Patient Active Problem List   Diagnosis Date Noted  . Tinea pedis 04/09/2020  . Spinal stenosis of lumbar region without neurogenic claudication 02/08/2019  . Nephropathy,  diabetic ([place]) 02/07/2019  . Gout 05/16/2018  . Gait instability 01/03/2018  . Frequent bowel movements 11/02/2017  . Tremor of both hands   . Stuttering   . Essential hypertension   . Hyperlipidemia 08/13/2015  . Breast cancer of lower-outer quadrant of right female breast ([place]) 02/13/2015  . Vitamin D deficiency 02/06/2015  . Type 2 diabetes mellitus with diabetic foot deformity ([place]) 01/01/2014  . Chronic combined systolic and diastolic heart failure ([place]) 02/26/2013  . OBESITY 02/05/2009  . Asthma, intermittent 02/08/2008  . NEPHROLITHIASIS 01/26/2007  . DJD (degenerative joint disease), multiple sites 01/26/2007    Caleb Popp 01/14/2021, 2:10 PM  [facility] [address] [place], [place], [address] Phone: [phone]   Fax:  [phone]  Name: [name] MRN: [id number] Date of Birth: 09-25-1951

## 2021-01-14 NOTE — Patient Instructions (Signed)
Access Code: [email or web address] URL: [email or web address] Date: 01/14/2021 Prepared by: Hilda Blades  Exercises Heel Toe Raises with Counter Support - 1 x daily - 7 x weekly - 3 sets - 10-15 reps Sit to Stand with Armchair - 1 x daily - 7 x weekly - 3 sets - 10 reps Standing Romberg to 1/2 Tandem Stance - 1 x daily - 7 x weekly - 3 reps - 30 seconds hold Standing Gastroc Stretch at Counter - 2-3 x daily - 7 x weekly - 3 reps - 30 seconds hold Long Sitting Calf Stretch with Strap - 1-2 x daily - 7 x weekly - 3 reps - 30 seconds hold Seated Plantar Fascia Mobilization with Small Ball - 1-2 x daily - 7 x weekly

## 2021-01-16 ENCOUNTER — Other Ambulatory Visit: Payer: Self-pay

## 2021-01-16 ENCOUNTER — Ambulatory Visit: Payer: Medicare Other | Admitting: Physical Therapy

## 2021-01-16 ENCOUNTER — Encounter: Payer: Self-pay | Admitting: Physical Therapy

## 2021-01-16 DIAGNOSIS — M25675 Stiffness of left foot, not elsewhere classified: Secondary | ICD-10-CM

## 2021-01-16 DIAGNOSIS — R2689 Other abnormalities of gait and mobility: Secondary | ICD-10-CM

## 2021-01-16 DIAGNOSIS — M25674 Stiffness of right foot, not elsewhere classified: Secondary | ICD-10-CM

## 2021-01-16 DIAGNOSIS — M79672 Pain in left foot: Secondary | ICD-10-CM

## 2021-01-16 DIAGNOSIS — M6281 Muscle weakness (generalized): Secondary | ICD-10-CM | POA: Diagnosis not present

## 2021-01-16 DIAGNOSIS — M79671 Pain in right foot: Secondary | ICD-10-CM

## 2021-01-16 DIAGNOSIS — R2681 Unsteadiness on feet: Secondary | ICD-10-CM

## 2021-01-16 NOTE — Therapy (Signed)
[place], [place], [address] Phone: [phone]   Fax:  [phone]  Physical Therapy Treatment  Patient Details  Name: [name] MRN: [id number] Date of Birth: [birth year] Referring Provider (PT): Evelina Bucy, DPM   Encounter Date: 01/16/2021   PT End of Session - 01/16/21 0907    Visit Number 3    Number of Visits 16    Date for PT Re-Evaluation 02/25/21    Authorization Type [facility] MCR/ MCD 2022    Authorization Time Period FOTO by 6th visit, kx by 15    Progress Note Due on Visit 10    PT Start Time 0916    PT Stop Time 1002    PT Time Calculation (min) 46 min    Equipment Utilized During Treatment Gait belt    Activity Tolerance Patient tolerated treatment well    Behavior During Therapy [facility] for tasks assessed/performed           Past Medical History:  Diagnosis Date  . Anemia   . Arthritis   . Asthma   . Blood transfusion without reported diagnosis    Patients believes 2015 when she overdosed on "medications"   . Breast cancer of lower-outer quadrant of right female breast ([place]) 02/13/2015  . Cataract   . CHF, acute ([place]) 05/03/2012  . Depression   . Diabetes mellitus   . Eczema   . Fatty liver    Fatty infiltration of liver noted on 03/2012 CT scan  . Fibromyalgia   . Glaucoma   . Heart disease   . History of kidney stones    w/ hx of hydronephrosis - followed by [facility]  . HIV nonspecific serology    2006: indeterminate HIV blood test, seen by ID, felt secondary to cross reacting antibodies with no further workup felt necessary at that time   . Hypertension   . Obesity   . Personal history of radiation therapy 2016   right  . Radiation 05/07/15-06/23/15   Right Breast    Past Surgical History:  Procedure Laterality Date  . BREAST BIOPSY Left 02/10/2015   malignant   . BREAST LUMPECTOMY Right 03/21/2015  . CHOLECYSTECTOMY  2003  . CYSTOSCOPY W/ LITHOLAPAXY /  EHL    . JOINT REPLACEMENT     bilateral knee replacement  . LEFT AND RIGHT HEART CATHETERIZATION WITH CORONARY ANGIOGRAM N/A 05/02/2012   Procedure: LEFT AND RIGHT HEART CATHETERIZATION WITH CORONARY ANGIOGRAM;  Surgeon: Burnell Blanks, MD;  Location: [facility] CATH LAB;  Service: Cardiovascular;  Laterality: N/A;  . RADIOACTIVE SEED GUIDED PARTIAL MASTECTOMY WITH AXILLARY SENTINEL LYMPH NODE BIOPSY Right 03/21/2015   Procedure: RIGHT  PARTIAL MASTECTOMY WITH RADIOACTIVE SEED LOCALIZATION RIGHT  AXILLARY SENTINEL LYMPH NODE BIOPSY;  Surgeon: Fanny Skates, MD;  Location: [place];  Service: General;  Laterality: Right;  . REPLACEMENT TOTAL KNEE BILATERAL  2005 &2006  . VASCULAR SURGERY Right 03/15/2013   Ultrasound guided sclerotherapy    There were no vitals filed for this visit.   Subjective Assessment - 01/16/21 0918    Subjective Pt notes nothing new since last time. Her pain is mostly anterior part of the ankle joint when she stands or walks and then the medial calcaneus.    Currently in Pain? Yes    Pain Score 7     Pain Location Foot    Pain Orientation Right;Left    Pain Descriptors / Indicators Aching;Sharp    Pain  Type Chronic pain    Pain Onset More than a month ago    Pain Frequency Constant                             [facility] Adult PT Treatment/Exercise - 01/16/21 0001      Dynamic Standing Balance   Dynamic Standing - Comments marches in place 2x15; vertical and horizontal head turns rhomberg 2x15 in place   LOB > with horizontal head turns compared to vertical head turns; marches in place significant later     Manual Therapy   Manual Therapy Taping;Soft tissue mobilization    Manual therapy comments taping of bilateral fat pad    Soft tissue mobilization bilateral soft tissue massage to plantar fasciitis, calcaneus, and gastroc/soleus complex                  PT Education - 01/16/21 0907    Education Details HEP     Person(s) Educated Patient    Methods Explanation;Handout    Comprehension Verbalized understanding;Need further instruction            PT Short Term Goals - 12/31/20 1559      PT SHORT TERM GOAL #1   Title Pt will be independent with initial HEP    Time 4    Period Weeks    Status New    Target Date 01/28/21      PT SHORT TERM GOAL #2   Title PT will go over FOTO results with pt.    Time 4    Period Weeks    Status New    Target Date 01/28/21             PT Long Term Goals - 01/14/21 1111      PT LONG TERM GOAL #1   Title Pt will be independent with advanced HEP    Time 6    Period Weeks    Status New    Target Date 02/11/21      PT LONG TERM GOAL #2   Title Pt will achieve 34/56 on the Berg Balance test to show a detectable change with balance interventions and to decrease the pts risk for falls.    Baseline 28/56    Time 6    Period Weeks    Status New    Target Date 02/11/21      PT LONG TERM GOAL #3   Title Pt will improve bilateral dorsiflexion to 10 degrees in order to decrease pain in bilateral feet.    Time 6    Period Weeks    Status New    Target Date 02/11/21      PT LONG TERM GOAL #4   Title Pt will improve FOTO from 49% to predicted 60% in order to show functional improvement.    Time 6    Period Weeks    Status New    Target Date 02/11/21                 Plan - 01/16/21 1032    Clinical Impression Statement Pt tolerated session well with no adverse effects. Session focused on soft tissue manual of bilateral plantar surfaces and calves, since significant bilateral tightness was noted. Bilateral calcaneal taping was done, but pt unable to tell if this helped her pain. Higher level balances exercises focused on anticipatory postural adjustments were done and pt demonstrated significant lateral swaying with horizontal head turns.  Pt continues to benefit from skilled PT in order to address balance impairements, soft tissue restrictions  in bilateral feet, and decrease LE pain.    PT Treatment/Interventions Electrical Stimulation;Iontophoresis 4mg /ml Dexamethasone;Moist Heat;Cryotherapy;Ultrasound;Gait training;Stair training;Functional mobility training;Therapeutic activities;Therapeutic exercise;Balance training;Neuromuscular re-education;Patient/family education;Manual techniques;Taping;Dry needling;Vasopneumatic Device    PT Next Visit Plan ankle dorisflexion mobs/MWM PRN, manual plantar aspect of feet and calves PRN, progress balance exercises    Consulted and Agree with Plan of Care Patient           Patient will benefit from skilled therapeutic intervention in order to improve the following deficits and impairments:  Decreased balance,Decreased mobility,Difficulty walking,Decreased activity tolerance,Decreased strength,Abnormal gait,Decreased range of motion,Cardiopulmonary status limiting activity,Decreased endurance,Decreased safety awareness,Pain,Impaired flexibility,Increased edema,Increased fascial restricitons  Visit Diagnosis: Pain in left foot  Pain in right foot  Stiffness of right foot, not elsewhere classified  Stiffness of left foot, not elsewhere classified  Other abnormalities of gait and mobility  Muscle weakness (generalized)  Unsteadiness on feet     Problem List Patient Active Problem List   Diagnosis Date Noted  . Tinea pedis 04/09/2020  . Spinal stenosis of lumbar region without neurogenic claudication 02/08/2019  . Nephropathy, diabetic ([place]) 02/07/2019  . Gout 05/16/2018  . Gait instability 01/03/2018  . Frequent bowel movements 11/02/2017  . Tremor of both hands   . Stuttering   . Essential hypertension   . Hyperlipidemia 08/13/2015  . Breast cancer of lower-outer quadrant of right female breast ([place]) 02/13/2015  . Vitamin D deficiency 02/06/2015  . Type 2 diabetes mellitus with diabetic foot deformity ([place]) 01/01/2014  . Chronic combined systolic and diastolic heart failure  ([place]) 02/26/2013  . OBESITY 02/05/2009  . Asthma, intermittent 02/08/2008  . NEPHROLITHIASIS 01/26/2007  . DJD (degenerative joint disease), multiple sites 01/26/2007    Caleb Popp 01/16/2021, 10:38 AM  [place], [place], [address] Phone: [phone]   Fax:  [phone]  Name: [name] MRN: [id number] Date of Birth: 1951-01-15

## 2021-01-19 ENCOUNTER — Encounter: Payer: Medicare Other | Admitting: Physical Therapy

## 2021-01-20 ENCOUNTER — Ambulatory Visit: Payer: Medicare Other | Admitting: Physical Therapy

## 2021-01-21 ENCOUNTER — Encounter: Payer: Self-pay | Admitting: Podiatry

## 2021-01-21 ENCOUNTER — Other Ambulatory Visit: Payer: Self-pay

## 2021-01-21 ENCOUNTER — Ambulatory Visit (INDEPENDENT_AMBULATORY_CARE_PROVIDER_SITE_OTHER): Payer: Medicare Other | Admitting: Orthotics

## 2021-01-21 ENCOUNTER — Ambulatory Visit (INDEPENDENT_AMBULATORY_CARE_PROVIDER_SITE_OTHER): Payer: Medicare Other | Admitting: Podiatry

## 2021-01-21 DIAGNOSIS — Z9181 History of falling: Secondary | ICD-10-CM | POA: Diagnosis not present

## 2021-01-21 DIAGNOSIS — E11610 Type 2 diabetes mellitus with diabetic neuropathic arthropathy: Secondary | ICD-10-CM

## 2021-01-21 DIAGNOSIS — E1140 Type 2 diabetes mellitus with diabetic neuropathy, unspecified: Secondary | ICD-10-CM

## 2021-01-21 DIAGNOSIS — R2689 Other abnormalities of gait and mobility: Secondary | ICD-10-CM

## 2021-01-21 DIAGNOSIS — M722 Plantar fascial fibromatosis: Secondary | ICD-10-CM | POA: Diagnosis not present

## 2021-01-21 DIAGNOSIS — M6788 Other specified disorders of synovium and tendon, other site: Secondary | ICD-10-CM

## 2021-01-21 DIAGNOSIS — B351 Tinea unguium: Secondary | ICD-10-CM

## 2021-01-21 DIAGNOSIS — M2010 Hallux valgus (acquired), unspecified foot: Secondary | ICD-10-CM

## 2021-01-21 DIAGNOSIS — M7732 Calcaneal spur, left foot: Secondary | ICD-10-CM

## 2021-01-21 NOTE — Progress Notes (Signed)
This patient returns to my office for at risk foot care.  This patient requires this care by a professional since this patient will be at risk due to having diabetes.   This patient is unable to cut nails herself since the patient cannot reach her nails.These nails are painful walking and wearing shoes.  This patient presents for at risk foot care today.  General Appearance  Alert, conversant and in no acute stress.  Vascular  Dorsalis pedis and posterior tibial  pulses are palpable  bilaterally.  Capillary return is within normal limits  bilaterally. Temperature is within normal limits  bilaterally.  Neurologic  Senn-Weinstein monofilament wire test absent   bilaterally. Muscle power within normal limits bilaterally.  Nails Thick disfigured discolored nails with subungual debris  from hallux to fifth toes bilaterally. No evidence of bacterial infection or drainage bilaterally.  Orthopedic  No limitations of motion  feet .  No crepitus or effusions noted.  No bony pathology or digital deformities noted. Asymptomatic  HAV  B/L.  Pes planus  B/L.  Skin  normotropic skin with no porokeratosis noted bilaterally.  No signs of infections or ulcers noted.   White mascerates skin interdigitally digits  Right foot.  Onychomycosis  Pain in right toes  Pain in left toes  Tinea pedis  Right foot.  Prescribe lotrimin-af  2 % aerosol spray.  Consent was obtained for treatment procedures.   Mechanical debridement of nails 1-5  bilaterally performed with a nail nipper.  Filed with dremel without incident. Asks to see Liliane Channel.   Return office visit  3 months                    Told patient to return for periodic foot care and evaluation due to potential at risk complications   Gardiner Barefoot DPM

## 2021-01-21 NOTE — Progress Notes (Signed)
Patient came in today to pick up Moore Balance Brace (Bilateral).   Patient was able to don brace independently and brace was check for fit to custom.   Patient was observed walking with brace and gait was improved as well as stability.   Patient was advised of care and wearing instructions.  Advised to notify practice if there were any issues; especially skin irritation.  

## 2021-01-22 ENCOUNTER — Encounter: Payer: Self-pay | Admitting: Physical Therapy

## 2021-01-22 ENCOUNTER — Ambulatory Visit: Payer: Medicare Other | Admitting: Physical Therapy

## 2021-01-22 ENCOUNTER — Other Ambulatory Visit: Payer: Self-pay

## 2021-01-22 DIAGNOSIS — M6281 Muscle weakness (generalized): Secondary | ICD-10-CM

## 2021-01-22 DIAGNOSIS — M79672 Pain in left foot: Secondary | ICD-10-CM | POA: Diagnosis not present

## 2021-01-22 DIAGNOSIS — R2689 Other abnormalities of gait and mobility: Secondary | ICD-10-CM

## 2021-01-22 DIAGNOSIS — R2681 Unsteadiness on feet: Secondary | ICD-10-CM | POA: Diagnosis not present

## 2021-01-22 DIAGNOSIS — M79671 Pain in right foot: Secondary | ICD-10-CM

## 2021-01-22 DIAGNOSIS — M25675 Stiffness of left foot, not elsewhere classified: Secondary | ICD-10-CM

## 2021-01-22 DIAGNOSIS — M25674 Stiffness of right foot, not elsewhere classified: Secondary | ICD-10-CM | POA: Diagnosis not present

## 2021-01-22 NOTE — Therapy (Addendum)
[place], [place], [address] Phone: [phone]   Fax:  [phone]  Physical Therapy Treatment  Patient Details  Name: [name] MRN: [id number] Date of Birth: 04/04/1951 Referring Provider (PT): Evelina Bucy, DPM   Encounter Date: 01/22/2021   PT End of Session - 01/22/21 1011     Visit Number 4    Number of Visits 16    Date for PT Re-Evaluation 02/25/21    Authorization Type [facility] MCR/ MCD 2022    Authorization Time Period FOTO by 6th visit, kx by 15    Progress Note Due on Visit 10    PT Start Time 0917    PT Stop Time 1005    PT Time Calculation (min) 48 min    Activity Tolerance Patient tolerated treatment well    Behavior During Therapy [facility] for tasks assessed/performed             Past Medical History:  Diagnosis Date   Anemia    Arthritis    Asthma    Blood transfusion without reported diagnosis    Patients believes 2015 when she overdosed on "medications"    Breast cancer of lower-outer quadrant of right female breast ([place]) 02/13/2015   Cataract    CHF, acute ([place]) 05/03/2012   Depression    Diabetes mellitus    Eczema    Fatty liver    Fatty infiltration of liver noted on 03/2012 CT scan   Fibromyalgia    Glaucoma    Heart disease    History of kidney stones    w/ hx of hydronephrosis - followed by [facility]   HIV nonspecific serology    2006: indeterminate HIV blood test, seen by ID, felt secondary to cross reacting antibodies with no further workup felt necessary at that time    Hypertension    Obesity    Personal history of radiation therapy 2016   right   Radiation 05/07/15-06/23/15   Right Breast    Past Surgical History:  Procedure Laterality Date   BREAST BIOPSY Left 02/10/2015   malignant    BREAST LUMPECTOMY Right 03/21/2015   CHOLECYSTECTOMY  2003   CYSTOSCOPY W/ LITHOLAPAXY / EHL     JOINT REPLACEMENT     bilateral knee replacement   LEFT AND  RIGHT HEART CATHETERIZATION WITH CORONARY ANGIOGRAM N/A 05/02/2012   Procedure: LEFT AND RIGHT HEART CATHETERIZATION WITH CORONARY ANGIOGRAM;  Surgeon: Burnell Blanks, MD;  Location: [facility] CATH LAB;  Service: Cardiovascular;  Laterality: N/A;   RADIOACTIVE SEED GUIDED PARTIAL MASTECTOMY WITH AXILLARY SENTINEL LYMPH NODE BIOPSY Right 03/21/2015   Procedure: RIGHT  PARTIAL MASTECTOMY WITH RADIOACTIVE SEED LOCALIZATION RIGHT  AXILLARY SENTINEL LYMPH NODE BIOPSY;  Surgeon: Fanny Skates, MD;  Location: [place];  Service: General;  Laterality: Right;   REPLACEMENT TOTAL KNEE BILATERAL  2005 &2006   VASCULAR SURGERY Right 03/15/2013   Ultrasound guided sclerotherapy    There were no vitals filed for this visit.   Subjective Assessment - 01/22/21 0923     Subjective Pt noticed a little bit of a difference with the tape last time, but had a hard time getting it off. Has her balance braces on that came in yesterday and she is wearing them, not sure if they are really making a difference in her balance. Her sister passed recently and needs to cancel her appt on March 7 to attend the funeral.    Currently in Pain?  Yes    Pain Score 7     Pain Location Foot    Pain Orientation Left;Right    Pain Descriptors / Indicators Aching;Sharp    Pain Type Chronic pain    Pain Onset More than a month ago    Pain Frequency Constant                                               [facility] Adult PT Treatment/Exercise - 01/22/21 0001       Exercises   Exercises Ankle      Ankle Exercises: Seated   Towel Crunch 5 reps;Limitations;Other (comment)   bilaterally, unable to flex toes enough to scrunch towel, compensation with knee flexion to move towel back   Towel Inversion/Eversion Limitations;5 reps   limited motion for inversion eversion, compensation with hip IR/ER   BAPS Level 1   fwd/back bilaterally x10, L/R x10 bilaterally, clockwise x10; decreased ROM and motor control,  especially with L/R and clockwise bilaterally; compensation with hip/knee motion   Other Seated Ankle Exercises bilateral dorsiflexion with yellow band x10      Ankle Exercises: Supine   T-Band 3 way ankle yellow band x10, inversion isometric with pillow                          PT Education - 01/22/21 1015     Education Details HEP update    Person(s) Educated Patient    Methods Explanation;Handout;Demonstration;Tactile cues;Verbal cues    Comprehension Verbalized understanding;Need further instruction              PT Short Term Goals - 12/31/20 1559       PT SHORT TERM GOAL #1   Title Pt will be independent with initial HEP    Time 4    Period Weeks    Status New    Target Date 01/28/21      PT SHORT TERM GOAL #2   Title PT will go over FOTO results with pt.    Time 4    Period Weeks    Status New    Target Date 01/28/21                PT Long Term Goals - 01/14/21 1111       PT LONG TERM GOAL #1   Title Pt will be independent with advanced HEP    Time 6    Period Weeks    Status New    Target Date 02/11/21      PT LONG TERM GOAL #2   Title Pt will achieve 34/56 on the Berg Balance test to show a detectable change with balance interventions and to decrease the pts risk for falls.    Baseline 28/56    Time 6    Period Weeks    Status New    Target Date 02/11/21      PT LONG TERM GOAL #3   Title Pt will improve bilateral dorsiflexion to 10 degrees in order to decrease pain in bilateral feet.    Time 6    Period Weeks    Status New    Target Date 02/11/21      PT LONG TERM GOAL #4   Title Pt will improve FOTO from 49% to predicted 60% in order to show functional improvement.  Time 6    Period Weeks    Status New    Target Date 02/11/21                        Plan - 01/22/21 1024     Clinical Impression Statement Pt tolerated session well with no adverse effects. Majority of session was focused on increasing intrinsic  musculature of the feet, but pt showed decrease motor control and ROM of the foot musculature, so exercises required several adjustments. Pt also showed decreased ROM and motor control at the ankle joint by decreased ability to perform BAPs board at the lowest level. Pt showed compensatory patterns with hip IR/ER due to lack of ability to perform inversion/eversion at the ankle. Able to perform ankle 3 way and inversion isometric with several compensations for lack of LE functional mobility. Pt continues to benefit from PT in order to help increase ankle ROM, motor control, LE strength, and balance in order to decrease bilateral foot pain and risk of falls.    PT Treatment/Interventions Electrical Stimulation;Iontophoresis 4mg /ml Dexamethasone;Moist Heat;Cryotherapy;Ultrasound;Gait training;Stair training;Functional mobility training;Therapeutic activities;Therapeutic exercise;Balance training;Neuromuscular re-education;Patient/family education;Manual techniques;Taping;Dry needling;Vasopneumatic Device    PT Next Visit Plan ankle dorisflexion mobs/MWM PRN, progress 4 way ankle, progress balance exercises    PT Home Exercise Plan [email or web address]: 4 way ankle (inversion isometric)    Consulted and Agree with Plan of Care Patient             Patient will benefit from skilled therapeutic intervention in order to improve the following deficits and impairments:  Decreased balance,Decreased mobility,Difficulty walking,Decreased activity tolerance,Decreased strength,Abnormal gait,Decreased range of motion,Cardiopulmonary status limiting activity,Decreased endurance,Decreased safety awareness,Pain,Impaired flexibility,Increased edema,Increased fascial restricitons  Visit Diagnosis: Pain in left foot  Pain in right foot  Stiffness of right foot, not elsewhere classified  Stiffness of left foot, not elsewhere classified  Other abnormalities of gait and mobility  Muscle weakness (generalized)  Unsteadiness  on feet      Problem List Patient Active Problem List   Diagnosis Date Noted   Tinea pedis 04/09/2020   Spinal stenosis of lumbar region without neurogenic claudication 02/08/2019   Nephropathy, diabetic ([place]) 02/07/2019   Gout 05/16/2018   Gait instability 01/03/2018   Frequent bowel movements 11/02/2017   Tremor of both hands    Stuttering    Essential hypertension    Hyperlipidemia 08/13/2015   Breast cancer of lower-outer quadrant of right female breast ([place]) 02/13/2015   Vitamin D deficiency 02/06/2015   Type 2 diabetes mellitus with diabetic foot deformity ([place]) 01/01/2014   Chronic combined systolic and diastolic heart failure ([place]) 02/26/2013   OBESITY 02/05/2009   Asthma, intermittent 02/08/2008   NEPHROLITHIASIS 01/26/2007   DJD (degenerative joint disease), multiple sites 01/26/2007    Caleb Popp, SPT 01/22/2021, 10:31 AM  [place], [place], [address] Phone: [phone]   Fax:  [phone]  Name: [name] MRN: [id number] Date of Birth: 07-15-1951

## 2021-01-22 NOTE — Patient Instructions (Signed)
Access Code: [email or web address] URL: [email or web address] Date: 01/22/2021 Prepared by: Hilda Blades  Exercises Heel Toe Raises with Counter Support - 1 x daily - 7 x weekly - 3 sets - 10-15 reps Sit to Stand with Armchair - 1 x daily - 7 x weekly - 3 sets - 10 reps Standing Romberg to 1/2 Tandem Stance - 1 x daily - 7 x weekly - 3 reps - 30 seconds hold Standing Gastroc Stretch at Counter - 2-3 x daily - 7 x weekly - 3 reps - 30 seconds hold Long Sitting Calf Stretch with Strap - 1-2 x daily - 7 x weekly - 3 reps - 30 seconds hold Seated Plantar Fascia Mobilization with Small Ball - 1-2 x daily - 7 x weekly Long Sitting Ankle Plantar Flexion with Resistance - 1 x daily - 7 x weekly - 2 sets - 10 reps Long Sitting Ankle Eversion with Resistance - 1 x daily - 7 x weekly - 2 sets - 10 reps Isometric Ankle Inversion - 1 x daily - 7 x weekly - 2 sets - 10 reps Seated Ankle Dorsiflexion with Resistance - 1 x daily - 7 x weekly - 2 sets - 10 reps

## 2021-01-26 ENCOUNTER — Other Ambulatory Visit: Payer: Self-pay

## 2021-01-26 ENCOUNTER — Encounter: Payer: Self-pay | Admitting: Physical Therapy

## 2021-01-26 ENCOUNTER — Ambulatory Visit: Payer: Medicare Other | Admitting: Physical Therapy

## 2021-01-26 DIAGNOSIS — R2681 Unsteadiness on feet: Secondary | ICD-10-CM

## 2021-01-26 DIAGNOSIS — M79672 Pain in left foot: Secondary | ICD-10-CM

## 2021-01-26 DIAGNOSIS — M6281 Muscle weakness (generalized): Secondary | ICD-10-CM | POA: Diagnosis not present

## 2021-01-26 DIAGNOSIS — M25675 Stiffness of left foot, not elsewhere classified: Secondary | ICD-10-CM

## 2021-01-26 DIAGNOSIS — R2689 Other abnormalities of gait and mobility: Secondary | ICD-10-CM | POA: Diagnosis not present

## 2021-01-26 DIAGNOSIS — M79671 Pain in right foot: Secondary | ICD-10-CM

## 2021-01-26 DIAGNOSIS — M25674 Stiffness of right foot, not elsewhere classified: Secondary | ICD-10-CM

## 2021-01-26 NOTE — Therapy (Addendum)
[place], [place], [address] Phone: [phone]   Fax:  [phone]  Physical Therapy Treatment  Patient Details  Name: [name] MRN: [id number] Date of Birth: November 01, 1951 Referring Provider (PT): Evelina Bucy, DPM   Encounter Date: 01/26/2021   PT End of Session - 01/26/21 0941     Visit Number 5    Number of Visits 16    Date for PT Re-Evaluation 02/25/21    Authorization Type [facility] MCR/ MCD 2022    Authorization Time Period FOTO by 6th visit, kx by 15    Progress Note Due on Visit 10    PT Start Time 0945    PT Stop Time 1032    PT Time Calculation (min) 47 min    Equipment Utilized During Treatment Gait belt    Activity Tolerance Patient tolerated treatment well    Behavior During Therapy [facility] for tasks assessed/performed             Past Medical History:  Diagnosis Date   Anemia    Arthritis    Asthma    Blood transfusion without reported diagnosis    Patients believes 2015 when she overdosed on "medications"    Breast cancer of lower-outer quadrant of right female breast ([place]) 02/13/2015   Cataract    CHF, acute ([place]) 05/03/2012   Depression    Diabetes mellitus    Eczema    Fatty liver    Fatty infiltration of liver noted on 03/2012 CT scan   Fibromyalgia    Glaucoma    Heart disease    History of kidney stones    w/ hx of hydronephrosis - followed by [facility]   HIV nonspecific serology    2006: indeterminate HIV blood test, seen by ID, felt secondary to cross reacting antibodies with no further workup felt necessary at that time    Hypertension    Obesity    Personal history of radiation therapy 2016   right   Radiation 05/07/15-06/23/15   Right Breast    Past Surgical History:  Procedure Laterality Date   BREAST BIOPSY Left 02/10/2015   malignant    BREAST LUMPECTOMY Right 03/21/2015   CHOLECYSTECTOMY  2003   CYSTOSCOPY W/ LITHOLAPAXY / EHL     JOINT  REPLACEMENT     bilateral knee replacement   LEFT AND RIGHT HEART CATHETERIZATION WITH CORONARY ANGIOGRAM N/A 05/02/2012   Procedure: LEFT AND RIGHT HEART CATHETERIZATION WITH CORONARY ANGIOGRAM;  Surgeon: Burnell Blanks, MD;  Location[facility] CATH LAB;  Service: Cardiovascular;  Laterality: N/A;   RADIOACTIVE SEED GUIDED PARTIAL MASTECTOMY WITH AXILLARY SENTINEL LYMPH NODE BIOPSY Right 03/21/2015   Procedure: RIGHT  PARTIAL MASTECTOMY WITH RADIOACTIVE SEED LOCALIZATION RIGHT  AXILLARY SENTINEL LYMPH NODE BIOPSY;  Surgeon: Fanny Skates, MD;  Location: [facility];  Service: General;  Laterality: Right;   REPLACEMENT TOTAL KNEE BILATERAL  2005 &2006   VASCULAR SURGERY Right 03/15/2013   Ultrasound guided sclerotherapy    There were no vitals filed for this visit.   Subjective Assessment - 01/26/21 0948     Subjective Pt states that her back has been hurting her, she isn't sure what she did but she is going to talk to her doctor about it. She wasn't able to do the long sitting exercises in her bed for her ankle, but able to do all the other exercises.    Currently in Pain? Yes    Pain Score  8    L minimal pain today, R more pain today   Pain Orientation Left;Right    Pain Descriptors / Indicators Sharp;Aching    Pain Onset More than a month ago    Pain Frequency Constant                                               [facility] Adult PT Treatment/Exercise - 01/26/21 0001       Dynamic Standing Balance   Head turns comments: x15    Head nods comments: x10    Dynamic Standing - Comments marches in place x15; bilateral arm lifts x10; arm circles in abduction x15; side steps x10 each way      Manual Therapy   Manual Therapy Passive ROM;Joint mobilization    Joint Mobilization unable to tolerate talocrural A/P mobs grade I R>L    Passive ROM unable to tolerate inversion/eversion on R foot, limited tolerance to dorsiflexion/plantarflexion; limited PROM  performed on L 4 way PROM ankles      Ankle Exercises: Seated   Other Seated Ankle Exercises EOB plantarflexion double yellow x20 bilaterally, dorsiflexion x20 yellow bilaterally, eversion x20 bilaterally      Ankle Exercises: Supine   T-Band inversion x20 bilateral                          PT Education - 01/26/21 1044     Education Details HEP update- no more long sitting 4 way exercise, all sitting EOB    Person(s) Educated Patient    Methods Explanation;Demonstration;Verbal cues;Handout    Comprehension Verbalized understanding;Need further instruction              PT Short Term Goals - 12/31/20 1559       PT SHORT TERM GOAL #1   Title Pt will be independent with initial HEP    Time 4    Period Weeks    Status New    Target Date 01/28/21      PT SHORT TERM GOAL #2   Title PT will go over FOTO results with pt.    Time 4    Period Weeks    Status New    Target Date 01/28/21                PT Long Term Goals - 01/14/21 1111       PT LONG TERM GOAL #1   Title Pt will be independent with advanced HEP    Time 6    Period Weeks    Status New    Target Date 02/11/21      PT LONG TERM GOAL #2   Title Pt will achieve 34/56 on the Berg Balance test to show a detectable change with balance interventions and to decrease the pts risk for falls.    Baseline 28/56    Time 6    Period Weeks    Status New    Target Date 02/11/21      PT LONG TERM GOAL #3   Title Pt will improve bilateral dorsiflexion to 10 degrees in order to decrease pain in bilateral feet.    Time 6    Period Weeks    Status New    Target Date 02/11/21      PT LONG TERM GOAL #4   Title Pt  will improve FOTO from 49% to predicted 60% in order to show functional improvement.    Time 6    Period Weeks    Status New    Target Date 02/11/21                        Plan - 01/26/21 1050     Clinical Impression Statement Pt tolerated session well with no adverse effects.  Alternatives were introduced for eversion and plantarflexion HEP since pt stated she was unable to get into long sitting at home. Pt shows limited ROM in all directions with 4 way ankle exercises. Manual PROM and joint mobs were introduced this session with limited tolerance even to grade I A/P taolcrural mobs due to widespread general ankle pain with it. PROM into inversion/eversion was more painful this session R>L so it was also d/c. Pt shows improvement with balance challenges that target her anticipatory postural adjustments as well as with a narrow BOS. Pt continues to benefit from skilled. PT in order to increase bilateral ankle ROM and strength, and balance in order to decrease bilateral foot pain and risk of falls.    PT Treatment/Interventions Electrical Stimulation;Iontophoresis 4mg /ml Dexamethasone;Moist Heat;Cryotherapy;Ultrasound;Gait training;Stair training;Functional mobility training;Therapeutic activities;Therapeutic exercise;Balance training;Neuromuscular re-education;Patient/family education;Manual techniques;Taping;Dry needling;Vasopneumatic Device    PT Next Visit Plan assess short term goals, gastroc/soleus stretching, progress 4 way ankle, progress blaance PRN    PT Home Exercise Plan [id number]: eversion and plantarflexion EOB    Consulted and Agree with Plan of Care Patient             Patient will benefit from skilled therapeutic intervention in order to improve the following deficits and impairments:  Decreased balance,Decreased mobility,Difficulty walking,Decreased activity tolerance,Decreased strength,Abnormal gait,Decreased range of motion,Cardiopulmonary status limiting activity,Decreased endurance,Decreased safety awareness,Pain,Impaired flexibility,Increased edema,Increased fascial restricitons  Visit Diagnosis: Pain in left foot  Pain in right foot  Stiffness of right foot, not elsewhere classified  Stiffness of left foot, not elsewhere classified  Other  abnormalities of gait and mobility  Muscle weakness (generalized)  Unsteadiness on feet      Problem List Patient Active Problem List   Diagnosis Date Noted   Tinea pedis 04/09/2020   Spinal stenosis of lumbar region without neurogenic claudication 02/08/2019   Nephropathy, diabetic ([place]) 02/07/2019   Gout 05/16/2018   Gait instability 01/03/2018   Frequent bowel movements 11/02/2017   Tremor of both hands    Stuttering    Essential hypertension    Hyperlipidemia 08/13/2015   Breast cancer of lower-outer quadrant of right female breast ([place]) 02/13/2015   Vitamin D deficiency 02/06/2015   Type 2 diabetes mellitus with diabetic foot deformity ([place]) 01/01/2014   Chronic combined systolic and diastolic heart failure ([place]) 02/26/2013   OBESITY 02/05/2009   Asthma, intermittent 02/08/2008   NEPHROLITHIASIS 01/26/2007   DJD (degenerative joint disease), multiple sites 01/26/2007    Caleb Popp, SPT 01/26/2021, 11:01 AM  [place], [place], [address] Phone: [phone]   Fax:  [phone]  Name: [name] MRN: [id number] Date of Birth: [birth year]

## 2021-01-26 NOTE — Patient Instructions (Signed)
Access Code: [email or web address] URL: [email or web address] Date: 01/26/2021 Prepared by: Hilda Blades  Exercises Heel Toe Raises with Counter Support - 1 x daily - 7 x weekly - 3 sets - 10-15 reps Sit to Stand with Armchair - 1 x daily - 7 x weekly - 3 sets - 10 reps Standing Romberg to 3/4 Tandem Stance - 1 x daily - 7 x weekly - 3 reps - 30 seconds hold Standing Gastroc Stretch at Counter - 2-3 x daily - 7 x weekly - 3 reps - 30 seconds hold Long Sitting Calf Stretch with Strap - 1-2 x daily - 7 x weekly - 3 reps - 30 seconds hold Seated Plantar Fascia Mobilization with Small Ball - 1-2 x daily - 7 x weekly Isometric Ankle Inversion - 1 x daily - 7 x weekly - 2 sets - 10 reps Seated Ankle Dorsiflexion with Resistance - 1 x daily - 7 x weekly - 2 sets - 10 reps Seated Ankle Plantarflexion with Resistance - 1 x daily - 7 x weekly - 2 sets - 10 reps Seated Ankle Eversion with Resistance - 1 x daily - 7 x weekly - 2 sets - 10 reps

## 2021-01-27 ENCOUNTER — Ambulatory Visit (INDEPENDENT_AMBULATORY_CARE_PROVIDER_SITE_OTHER): Payer: Medicare Other | Admitting: Family Medicine

## 2021-01-27 ENCOUNTER — Encounter: Payer: Self-pay | Admitting: Family Medicine

## 2021-01-27 ENCOUNTER — Other Ambulatory Visit: Payer: Self-pay

## 2021-01-27 VITALS — BP 142/82 | HR 77 | Wt 215.6 lb

## 2021-01-27 DIAGNOSIS — M1000 Idiopathic gout, unspecified site: Secondary | ICD-10-CM | POA: Diagnosis not present

## 2021-01-27 DIAGNOSIS — I10 Essential (primary) hypertension: Secondary | ICD-10-CM

## 2021-01-27 DIAGNOSIS — E1169 Type 2 diabetes mellitus with other specified complication: Secondary | ICD-10-CM | POA: Diagnosis not present

## 2021-01-27 DIAGNOSIS — M21969 Unspecified acquired deformity of unspecified lower leg: Secondary | ICD-10-CM

## 2021-01-27 DIAGNOSIS — E7800 Pure hypercholesterolemia, unspecified: Secondary | ICD-10-CM

## 2021-01-27 LAB — POCT GLYCOSYLATED HEMOGLOBIN (HGB A1C): Hemoglobin A1C: 7.1 % — AB (ref 4.0–5.6)

## 2021-01-27 NOTE — Assessment & Plan Note (Signed)
Well controlled.  Check UA level.  May try to wean off colchicine

## 2021-01-27 NOTE — Patient Instructions (Addendum)
Good to see you today - Thank you for coming in  Things we discussed today:  Look at home for your Lipitor - Atorvastatin take every day  Metformin take one tab once a day  I will call you if your tests are not good.  Otherwise, I will send you a message on MyChart (if it is active) or a letter in the mail..  If you do not hear from me with in 2 weeks please call our office.     Please always bring your medication bottles  Come back to see me in 3 month

## 2021-01-27 NOTE — Assessment & Plan Note (Signed)
Stable Check labs 

## 2021-01-27 NOTE — Progress Notes (Signed)
    SUBJECTIVE:   CHIEF COMPLAINT / HPI:   Diabetes  No low blood sugar.  Workng on cutting down sweets.  Brings all her medications  Cholesterol No chest pain.  Did not bring lipitor bottle but sure she has at home  GOUT No recent flares.  Takes colchicine and allopurinol daily   PERTINENT  PMH / PSH: Her sister died recently really feels the loss  OBJECTIVE:   BP (!) 142/82   Pulse 77   Wt 215 lb 9.6 oz (97.8 kg)   SpO2 90%   BMI 39.43 kg/m   Alert nad Walking well with walker Mildly sad but conversant and philosophic   ASSESSMENT/PLAN:   Essential hypertension BP Readings from Last 3 Encounters:  01/27/21 (!) 142/82  12/31/20 (!) 164/94  12/30/20 (!) 157/86   Mildly elevated but given gait instability will monitor.  Hopefully will decrease with continued weight loss.   May try HCTZ next visit if still elevated but need to watch for gout worsening   Type 2 diabetes mellitus with diabetic foot deformity Very good control.  Decrease metformin to 500mg  daily   Gout Well controlled.  Check UA level.  May try to wean off colchicine   Hyperlipidemia Stable Check labs      Lind Covert, [place]

## 2021-01-27 NOTE — Assessment & Plan Note (Addendum)
BP Readings from Last 3 Encounters:  01/27/21 (!) 142/82  12/31/20 (!) 164/94  12/30/20 (!) 157/86   Mildly elevated but given gait instability will monitor.  Hopefully will decrease with continued weight loss.   May try HCTZ next visit if still elevated but need to watch for gout worsening

## 2021-01-27 NOTE — Assessment & Plan Note (Signed)
Very good control.  Decrease metformin to 500mg  daily

## 2021-01-28 ENCOUNTER — Encounter: Payer: Self-pay | Admitting: Family Medicine

## 2021-01-28 ENCOUNTER — Ambulatory Visit: Payer: Medicare Other | Admitting: Physical Therapy

## 2021-01-28 LAB — LIPID PANEL
Chol/HDL Ratio: 2.6 ratio (ref 0.0–4.4)
Cholesterol, Total: 97 mg/dL — ABNORMAL LOW (ref 100–199)
HDL: 38 mg/dL — ABNORMAL LOW (ref >39)
LDL Chol Calc (NIH): 25 mg/dL (ref 0–99)
Triglycerides: 223 mg/dL — ABNORMAL HIGH (ref 0–149)
VLDL Cholesterol Cal: 34 mg/dL (ref 5–40)

## 2021-01-28 LAB — URIC ACID: Uric Acid: 4.9 mg/dL (ref 3.1–7.9)

## 2021-02-02 ENCOUNTER — Encounter: Payer: Medicare Other | Admitting: Physical Therapy

## 2021-02-04 ENCOUNTER — Other Ambulatory Visit: Payer: Self-pay

## 2021-02-04 ENCOUNTER — Encounter: Payer: Self-pay | Admitting: Physical Therapy

## 2021-02-04 ENCOUNTER — Ambulatory Visit: Payer: Medicare Other | Attending: Podiatry | Admitting: Physical Therapy

## 2021-02-04 DIAGNOSIS — M79672 Pain in left foot: Secondary | ICD-10-CM | POA: Diagnosis not present

## 2021-02-04 DIAGNOSIS — M545 Low back pain, unspecified: Secondary | ICD-10-CM | POA: Diagnosis not present

## 2021-02-04 DIAGNOSIS — R2681 Unsteadiness on feet: Secondary | ICD-10-CM | POA: Diagnosis not present

## 2021-02-04 DIAGNOSIS — R2689 Other abnormalities of gait and mobility: Secondary | ICD-10-CM | POA: Diagnosis not present

## 2021-02-04 DIAGNOSIS — M25674 Stiffness of right foot, not elsewhere classified: Secondary | ICD-10-CM | POA: Diagnosis not present

## 2021-02-04 DIAGNOSIS — M79671 Pain in right foot: Secondary | ICD-10-CM | POA: Insufficient documentation

## 2021-02-04 DIAGNOSIS — M25675 Stiffness of left foot, not elsewhere classified: Secondary | ICD-10-CM | POA: Diagnosis not present

## 2021-02-04 DIAGNOSIS — G8929 Other chronic pain: Secondary | ICD-10-CM | POA: Diagnosis not present

## 2021-02-04 DIAGNOSIS — M6281 Muscle weakness (generalized): Secondary | ICD-10-CM | POA: Insufficient documentation

## 2021-02-04 NOTE — Therapy (Addendum)
[place], [place], [address] Phone: [phone]   Fax:  [phone]  Physical Therapy Treatment  Patient Details  Name: [name] MRN: [id number] Date of Birth: 1951-01-21 Referring Provider (PT): Evelina Bucy, DPM   Encounter Date: 02/04/2021   PT End of Session - 02/04/21 0945    Visit Number 6    Number of Visits 16    Date for PT Re-Evaluation 02/25/21    Authorization Type [employer] MCR/ MCD 2022    Authorization Time Period FOTO by 6th visit, kx by 15    Progress Note Due on Visit 10    PT Start Time 1001    PT Stop Time 1045    PT Time Calculation (min) 44 min    Equipment Utilized During Treatment Gait belt    Activity Tolerance Patient tolerated treatment well    Behavior During Therapy [facility] for tasks assessed/performed           Past Medical History:  Diagnosis Date  . Anemia   . Arthritis   . Asthma   . Blood transfusion without reported diagnosis    Patients believes 2015 when she overdosed on "medications"   . Breast cancer of lower-outer quadrant of right female breast ([place]) 02/13/2015  . Cataract   . CHF, acute ([place]) 05/03/2012  . Depression   . Diabetes mellitus   . Eczema   . Fatty liver    Fatty infiltration of liver noted on 03/2012 CT scan  . Fibromyalgia   . Glaucoma   . Heart disease   . History of kidney stones    w/ hx of hydronephrosis - followed by [facility]  . HIV nonspecific serology    2006: indeterminate HIV blood test, seen by ID, felt secondary to cross reacting antibodies with no further workup felt necessary at that time   . Hypertension   . Obesity   . Personal history of radiation therapy 2016   right  . Radiation 05/07/15-06/23/15   Right Breast    Past Surgical History:  Procedure Laterality Date  . BREAST BIOPSY Left 02/10/2015   malignant   . BREAST LUMPECTOMY Right 03/21/2015  . CHOLECYSTECTOMY  2003  . CYSTOSCOPY W/ LITHOLAPAXY / EHL     . JOINT REPLACEMENT     bilateral knee replacement  . LEFT AND RIGHT HEART CATHETERIZATION WITH CORONARY ANGIOGRAM N/A 05/02/2012   Procedure: LEFT AND RIGHT HEART CATHETERIZATION WITH CORONARY ANGIOGRAM;  Surgeon: Burnell Blanks, MD;  Location: [facility] CATH LAB;  Service: Cardiovascular;  Laterality: N/A;  . RADIOACTIVE SEED GUIDED PARTIAL MASTECTOMY WITH AXILLARY SENTINEL LYMPH NODE BIOPSY Right 03/21/2015   Procedure: RIGHT  PARTIAL MASTECTOMY WITH RADIOACTIVE SEED LOCALIZATION RIGHT  AXILLARY SENTINEL LYMPH NODE BIOPSY;  Surgeon: Fanny Skates, MD;  Location: [place];  Service: General;  Laterality: Right;  . REPLACEMENT TOTAL KNEE BILATERAL  2005 &2006  . VASCULAR SURGERY Right 03/15/2013   Ultrasound guided sclerotherapy    There were no vitals filed for this visit.   Subjective Assessment - 02/04/21 1001    Subjective Been doing some of my exercises, the foot pain is about the same. I have an infected tooth today so I am in a lot of pain there.    Currently in Pain? Yes    Pain Score 7     Pain Location Foot    Pain Orientation Right;Left    Pain Descriptors / Indicators Sharp;Aching  Pain Type Chronic pain    Pain Onset More than a month ago    Pain Frequency Constant              [facility] PT Assessment - 02/04/21 0001      Observation/Other Assessments   Focus on Therapeutic Outcomes (FOTO)  64%                         [facility] Adult PT Treatment/Exercise - 02/04/21 0001      Dynamic Standing Balance   Dynamic Standing - Comments heel to toe x5, d/c because hurt fet with boot orthotics on; 3/4 rhomberg L and R with vertical and horizontal head turns x20; toe taps onto airex 2x20      Ankle Exercises: Seated   Other Seated Ankle Exercises EOB plantarflexion red x20 bilaterally, dorsiflexion x20 red bilaterally, eversion red x20 bilaterally      Ankle Exercises: Supine   T-Band inversion red x20 bilaterally      Ankle Exercises:  Stretches   Soleus Stretch --   not tolerated this session with orthotic boots on with slant board   Gastroc Stretch 3 reps;30 seconds;Other (comment)   on slant board bilaterally                 PT Education - 02/04/21 1100    Education Details HEP    Person(s) Educated Patient    Methods Explanation;Demonstration;Verbal cues    Comprehension Verbalized understanding;Need further instruction            PT Short Term Goals - 02/04/21 0946      PT SHORT TERM GOAL #1   Title Pt will be independent with initial HEP    Status Achieved      PT SHORT TERM GOAL #2   Title PT will go over FOTO results with pt.    Time --    Period --    Status --    Target Date 01/28/21             PT Long Term Goals - 01/14/21 1111      PT LONG TERM GOAL #1   Title Pt will be independent with advanced HEP    Time 6    Period Weeks    Status New    Target Date 02/11/21      PT LONG TERM GOAL #2   Title Pt will achieve 34/56 on the Berg Balance test to show a detectable change with balance interventions and to decrease the pts risk for falls.    Baseline 28/56    Time 6    Period Weeks    Status New    Target Date 02/11/21      PT LONG TERM GOAL #3   Title Pt will improve bilateral dorsiflexion to 10 degrees in order to decrease pain in bilateral feet.    Time 6    Period Weeks    Status New    Target Date 02/11/21      PT LONG TERM GOAL #4   Title Pt will improve FOTO from 49% to predicted 60% in order to show functional improvement.    Time 6    Period Weeks    Status New    Target Date 02/11/21                 Plan - 02/04/21 1017    Clinical Impression Statement Pt tolerated PT well with no adverse  effects. Pt was able to progress with resistance in 4 way ankle exercises, but still shows limited ROM. Balance this session targetted anticipatory postural adjustments, decreasing visual relaince, and narrowing BOS. More reliance on UE support for balance  exercises was noted this session. Showed limited range with gastroc stretching on the slant board this session. Pt has met all short term goals. Continues to benefit from skilled PT in order to address ankle ROM and strength deficits and increase balance in order to help decrease weightbearing pain, increase functional mobility, and decrease fall risk.    Personal Factors and Comorbidities Time since onset of injury/illness/exacerbation;Comorbidity 1;Comorbidity 2;Past/Current Experience    Comorbidities DM II, essential HTN, gout, Charcot foot, anemia, depression, fibromyalgia, arthritis    Examination-Activity Limitations Stairs;Stand;Locomotion Level;Bend;Lift;Squat;Transfers;Reach Overhead    Examination-Participation Restrictions Community Activity;Laundry;Meal Prep;Cleaning;Shop;Volunteer;Yard Work    Stability/Clinical Decision Making Stable/Uncomplicated    Clinical Decision Making Low    Rehab Potential Good    PT Frequency 1x / week    PT Duration 6 weeks    PT Treatment/Interventions Electrical Stimulation;Iontophoresis 38m/ml Dexamethasone;Moist Heat;Cryotherapy;Ultrasound;Gait training;Stair training;Functional mobility training;Therapeutic activities;Therapeutic exercise;Balance training;Neuromuscular re-education;Patient/family education;Manual techniques;Taping;Dry needling;Vasopneumatic Device    PT Next Visit Plan gastroc/soleus stretching, progress 4 way ankle, progress balance focusing on APA and reactive balance PRN    PT Home Exercise Plan [email or web address]    Consulted and Agree with Plan of Care Patient           Patient will benefit from skilled therapeutic intervention in order to improve the following deficits and impairments:  Decreased balance,Decreased mobility,Difficulty walking,Decreased activity tolerance,Decreased strength,Abnormal gait,Decreased range of motion,Cardiopulmonary status limiting activity,Decreased endurance,Decreased safety awareness,Pain,Impaired  flexibility,Increased edema,Increased fascial restricitons  Visit Diagnosis: Pain in left foot  Pain in right foot  Stiffness of right foot, not elsewhere classified  Stiffness of left foot, not elsewhere classified  Other abnormalities of gait and mobility  Muscle weakness (generalized)  Unsteadiness on feet  Gait instability  Chronic midline low back pain, unspecified whether sciatica present     Problem List Patient Active Problem List   Diagnosis Date Noted  . Tinea pedis 04/09/2020  . Spinal stenosis of lumbar region without neurogenic claudication 02/08/2019  . Nephropathy, diabetic ([place] 02/07/2019  . Gout 05/16/2018  . Gait instability 01/03/2018  . Frequent bowel movements 11/02/2017  . Tremor of both hands   . Stuttering   . Essential hypertension   . Hyperlipidemia 08/13/2015  . Breast cancer of lower-outer quadrant of right female breast ([place] 02/13/2015  . Vitamin D deficiency 02/06/2015  . Type 2 diabetes mellitus with diabetic foot deformity ([place] 01/01/2014  . Chronic combined systolic and diastolic heart failure ([place] 02/26/2013  . OBESITY 02/05/2009  . Asthma, intermittent 02/08/2008  . NEPHROLITHIASIS 01/26/2007  . DJD (degenerative joint disease), multiple sites 01/26/2007    CCaleb Popp SPT 02/04/2021, 12:38 PM  [facility] [address]Phone: [phone]  Fax:  [phone] Name: ATYAUNA LACAZEMRN: [id number] of Birth: 3Feb 06, 1952

## 2021-02-10 ENCOUNTER — Other Ambulatory Visit: Payer: Self-pay

## 2021-02-10 ENCOUNTER — Ambulatory Visit: Payer: Medicare Other | Admitting: Physical Therapy

## 2021-02-10 ENCOUNTER — Encounter: Payer: Self-pay | Admitting: Physical Therapy

## 2021-02-10 DIAGNOSIS — M79672 Pain in left foot: Secondary | ICD-10-CM

## 2021-02-10 DIAGNOSIS — M79671 Pain in right foot: Secondary | ICD-10-CM

## 2021-02-10 DIAGNOSIS — M25674 Stiffness of right foot, not elsewhere classified: Secondary | ICD-10-CM | POA: Diagnosis not present

## 2021-02-10 DIAGNOSIS — R2689 Other abnormalities of gait and mobility: Secondary | ICD-10-CM | POA: Diagnosis not present

## 2021-02-10 DIAGNOSIS — M6281 Muscle weakness (generalized): Secondary | ICD-10-CM

## 2021-02-10 DIAGNOSIS — M25675 Stiffness of left foot, not elsewhere classified: Secondary | ICD-10-CM | POA: Diagnosis not present

## 2021-02-10 DIAGNOSIS — R2681 Unsteadiness on feet: Secondary | ICD-10-CM | POA: Diagnosis not present

## 2021-02-10 DIAGNOSIS — G8929 Other chronic pain: Secondary | ICD-10-CM | POA: Diagnosis not present

## 2021-02-10 DIAGNOSIS — M545 Low back pain, unspecified: Secondary | ICD-10-CM | POA: Diagnosis not present

## 2021-02-10 NOTE — Patient Instructions (Signed)
Access Code: [email or web address] URL: [email or web address] Date: 02/10/2021 Prepared by: Caleb Popp  Exercises Standing March with Counter Support - 1 x daily - 7 x weekly - 3 sets - 10 reps Standing Hip Abduction with Counter Support - 1 x daily - 7 x weekly - 3 sets - 10 reps Standing Hip Extension with Counter Support - 1 x daily - 7 x weekly - 3 sets - 10 reps

## 2021-02-10 NOTE — Therapy (Addendum)
[place], [place], [address] Phone: [phone]   Fax:  [phone]  Physical Therapy Treatment  Patient Details  Name: [name] MRN: [id number] Date of Birth: 22-Feb-1951 Referring Provider (PT): Evelina Bucy, DPM   Encounter Date: 02/10/2021   PT End of Session - 02/10/21 1520     Visit Number 7    Number of Visits 16    Date for PT Re-Evaluation 02/25/21    Authorization Type [facility] MCR/ MCD 2022    Authorization Time Period FOTO by 6th visit, kx by 15    PT Start Time 1047    PT Stop Time 1137    PT Time Calculation (min) 50 min    Activity Tolerance Patient tolerated treatment well    Behavior During Therapy [facility] for tasks assessed/performed             Past Medical History:  Diagnosis Date   Anemia    Arthritis    Asthma    Blood transfusion without reported diagnosis    Patients believes 2015 when she overdosed on "medications"    Breast cancer of lower-outer quadrant of right female breast ([place]) 02/13/2015   Cataract    CHF, acute ([place]) 05/03/2012   Depression    Diabetes mellitus    Eczema    Fatty liver    Fatty infiltration of liver noted on 03/2012 CT scan   Fibromyalgia    Glaucoma    Heart disease    History of kidney stones    w/ hx of hydronephrosis - followed by [facility]   HIV nonspecific serology    2006: indeterminate HIV blood test, seen by ID, felt secondary to cross reacting antibodies with no further workup felt necessary at that time    Hypertension    Obesity    Personal history of radiation therapy 2016   right   Radiation 05/07/15-06/23/15   Right Breast    Past Surgical History:  Procedure Laterality Date   BREAST BIOPSY Left 02/10/2015   malignant    BREAST LUMPECTOMY Right 03/21/2015   CHOLECYSTECTOMY  2003   CYSTOSCOPY W/ LITHOLAPAXY / EHL     JOINT REPLACEMENT     bilateral knee replacement   LEFT AND RIGHT HEART CATHETERIZATION WITH  CORONARY ANGIOGRAM N/A 05/02/2012   Procedure: LEFT AND RIGHT HEART CATHETERIZATION WITH CORONARY ANGIOGRAM;  Surgeon: Burnell Blanks, MD;  Location: [facility] CATH LAB;  Service: Cardiovascular;  Laterality: N/A;   RADIOACTIVE SEED GUIDED PARTIAL MASTECTOMY WITH AXILLARY SENTINEL LYMPH NODE BIOPSY Right 03/21/2015   Procedure: RIGHT  PARTIAL MASTECTOMY WITH RADIOACTIVE SEED LOCALIZATION RIGHT  AXILLARY SENTINEL LYMPH NODE BIOPSY;  Surgeon: Fanny Skates, MD;  Location: [facility];  Service: General;  Laterality: Right;   REPLACEMENT TOTAL KNEE BILATERAL  2005 &2006   VASCULAR SURGERY Right 03/15/2013   Ultrasound guided sclerotherapy    There were no vitals filed for this visit.   Subjective Assessment - 02/10/21 1051     Subjective My L knee is hurting a lot too, i'm not sure why because I got it replaced. I've been doing my exercises.    Currently in Pain? Yes    Pain Score 9     Pain Location Foot   and knee   Pain Orientation Right;Left   R>L foot, L knee pain   Pain Descriptors / Indicators Aching;Burning    Pain Type Chronic pain    Pain Onset More than  a month ago    Pain Frequency Constant                                               [facility] Adult PT Treatment/Exercise - 02/10/21 0001       Dynamic Standing Balance   Dynamic Standing - Comments marching x10 onto foam pad taps; marching onto 4 inch step x10 taps; rhomberg horizontal head turns x20      Knee/Hip Exercises: Stretches   Press photographer 2 reps;30 seconds    Gastroc Stretch Limitations with strap sitting EOB      Knee/Hip Exercises: Standing   Hip Flexion 2 sets;10 reps    Hip Abduction 2 sets;10 reps    Hip Extension 2 sets;10 reps      Knee/Hip Exercises: Seated   Sit to Sand 2 sets;10 reps;without UE support      Ankle Exercises: Supine   T-Band 4 way ankle green x20                          PT Education - 02/10/21 1109     Education Details HEP update     Person(s) Educated Patient    Methods Explanation;Demonstration;Handout    Comprehension Verbalized understanding;Need further instruction;Returned demonstration              PT Short Term Goals - 02/04/21 0946       PT SHORT TERM GOAL #1   Title Pt will be independent with initial HEP    Status Achieved      PT SHORT TERM GOAL #2   Title PT will go over FOTO results with pt.    Time --    Period --    Status --    Target Date 01/28/21                PT Long Term Goals - 01/14/21 1111       PT LONG TERM GOAL #1   Title Pt will be independent with advanced HEP    Time 6    Period Weeks    Status New    Target Date 02/11/21      PT LONG TERM GOAL #2   Title Pt will achieve 34/56 on the Berg Balance test to show a detectable change with balance interventions and to decrease the pts risk for falls.    Baseline 28/56    Time 6    Period Weeks    Status New    Target Date 02/11/21      PT LONG TERM GOAL #3   Title Pt will improve bilateral dorsiflexion to 10 degrees in order to decrease pain in bilateral feet.    Time 6    Period Weeks    Status New    Target Date 02/11/21      PT LONG TERM GOAL #4   Title Pt will improve FOTO from 49% to predicted 60% in order to show functional improvement.    Time 6    Period Weeks    Status New    Target Date 02/11/21                        Plan - 02/10/21 1517     Clinical Impression Statement Pt tolerated PT well with no adverse  effects. Able to progress 4 way exercises to green band this session. Increasing dorsiflexion with stretching was also performed this session. Balance activities this session focused on reactive and anticaptory postural adjustments with an increase in tolerance. Pt was told to schedule 1x/week for 4 more weeks so that PT can try to continue to address balance deficits and make sure pt is independent with ankle exercises. Pt continues to benefit from skilled PT in order to address  ankle ROM and strength deficits, as well as balance deficits, in order to decrease fall risk, increase community ambulation, and decrease foot pain.    PT Treatment/Interventions Electrical Stimulation;Iontophoresis 4mg /ml Dexamethasone;Moist Heat;Cryotherapy;Ultrasound;Gait training;Stair training;Functional mobility training;Therapeutic activities;Therapeutic exercise;Balance training;Neuromuscular re-education;Patient/family education;Manual techniques;Taping;Dry needling;Vasopneumatic Device    PT Next Visit Plan FOTO, assess long term goals, perform Berg, progress 4 way ankle, gastroc/soleus stretching, perform reactive/APA exercise balance tasks    PT Home Exercise Plan [id number]    Consulted and Agree with Plan of Care Patient             Patient will benefit from skilled therapeutic intervention in order to improve the following deficits and impairments:  Decreased balance,Decreased mobility,Difficulty walking,Decreased activity tolerance,Decreased strength,Abnormal gait,Decreased range of motion,Cardiopulmonary status limiting activity,Decreased endurance,Decreased safety awareness,Pain,Impaired flexibility,Increased edema,Increased fascial restricitons  Visit Diagnosis: Pain in left foot  Pain in right foot  Stiffness of right foot, not elsewhere classified  Stiffness of left foot, not elsewhere classified  Other abnormalities of gait and mobility  Muscle weakness (generalized)  Unsteadiness on feet      Problem List Patient Active Problem List   Diagnosis Date Noted   Tinea pedis 04/09/2020   Spinal stenosis of lumbar region without neurogenic claudication 02/08/2019   Nephropathy, diabetic ([place]) 02/07/2019   Gout 05/16/2018   Gait instability 01/03/2018   Frequent bowel movements 11/02/2017   Tremor of both hands    Stuttering    Essential hypertension    Hyperlipidemia 08/13/2015   Breast cancer of lower-outer quadrant of right female breast ([place]) 02/13/2015    Vitamin D deficiency 02/06/2015   Type 2 diabetes mellitus with diabetic foot deformity ([place]) 01/01/2014   Chronic combined systolic and diastolic heart failure ([place]) 02/26/2013   OBESITY 02/05/2009   Asthma, intermittent 02/08/2008   NEPHROLITHIASIS 01/26/2007   DJD (degenerative joint disease), multiple sites 01/26/2007    Caleb Popp, SPT 02/10/2021, 4:28 PM  [facility] [address] [place], [place], [address] Phone: [phone]   Fax:  [phone]  Name: [name] MRN: [id number] Date of Birth: Sep 25, 1951

## 2021-02-19 ENCOUNTER — Ambulatory Visit: Payer: Medicare Other | Admitting: Physical Therapy

## 2021-02-19 ENCOUNTER — Encounter: Payer: Self-pay | Admitting: Physical Therapy

## 2021-02-19 ENCOUNTER — Other Ambulatory Visit: Payer: Self-pay

## 2021-02-19 DIAGNOSIS — M25675 Stiffness of left foot, not elsewhere classified: Secondary | ICD-10-CM

## 2021-02-19 DIAGNOSIS — R2681 Unsteadiness on feet: Secondary | ICD-10-CM

## 2021-02-19 DIAGNOSIS — G8929 Other chronic pain: Secondary | ICD-10-CM

## 2021-02-19 DIAGNOSIS — M79672 Pain in left foot: Secondary | ICD-10-CM | POA: Diagnosis not present

## 2021-02-19 DIAGNOSIS — R2689 Other abnormalities of gait and mobility: Secondary | ICD-10-CM | POA: Diagnosis not present

## 2021-02-19 DIAGNOSIS — M79671 Pain in right foot: Secondary | ICD-10-CM | POA: Diagnosis not present

## 2021-02-19 DIAGNOSIS — M6281 Muscle weakness (generalized): Secondary | ICD-10-CM | POA: Diagnosis not present

## 2021-02-19 DIAGNOSIS — M25674 Stiffness of right foot, not elsewhere classified: Secondary | ICD-10-CM

## 2021-02-19 DIAGNOSIS — M545 Low back pain, unspecified: Secondary | ICD-10-CM | POA: Diagnosis not present

## 2021-02-19 NOTE — Therapy (Addendum)
[place], [place], [address] Phone: [phone]   Fax:  [phone]  Physical Therapy Treatment / ERO  Progress Note Reporting Period 12/31/2020 to 02/19/2021  See note below for Objective Data and Assessment of Progress/Goals.     Patient Details  Name: [name] MRN: [id number] Date of Birth: 30-Oct-1951 Referring Provider (PT): Evelina Bucy, DPM   Encounter Date: 02/19/2021   PT End of Session - 02/19/21 1033     Visit Number 8    Number of Visits 16    Date for PT Re-Evaluation 03/19/21    Authorization Type [facility] MCR/ MCD 2022    Authorization Time Period kx by 15    Progress Note Due on Visit 18    PT Start Time 1036    PT Stop Time 1120    PT Time Calculation (min) 44 min    Activity Tolerance Patient tolerated treatment well    Behavior During Therapy [facility] for tasks assessed/performed             Past Medical History:  Diagnosis Date   Anemia    Arthritis    Asthma    Blood transfusion without reported diagnosis    Patients believes 2015 when she overdosed on "medications"    Breast cancer of lower-outer quadrant of right female breast ([place]) 02/13/2015   Cataract    CHF, acute ([place]) 05/03/2012   Depression    Diabetes mellitus    Eczema    Fatty liver    Fatty infiltration of liver noted on 03/2012 CT scan   Fibromyalgia    Glaucoma    Heart disease    History of kidney stones    w/ hx of hydronephrosis - followed by [facility]   HIV nonspecific serology    2006: indeterminate HIV blood test, seen by ID, felt secondary to cross reacting antibodies with no further workup felt necessary at that time    Hypertension    Obesity    Personal history of radiation therapy 2016   right   Radiation 05/07/15-06/23/15   Right Breast    Past Surgical History:  Procedure Laterality Date   BREAST BIOPSY Left 02/10/2015   malignant    BREAST LUMPECTOMY Right 03/21/2015    CHOLECYSTECTOMY  2003   CYSTOSCOPY W/ LITHOLAPAXY / EHL     JOINT REPLACEMENT     bilateral knee replacement   LEFT AND RIGHT HEART CATHETERIZATION WITH CORONARY ANGIOGRAM N/A 05/02/2012   Procedure: LEFT AND RIGHT HEART CATHETERIZATION WITH CORONARY ANGIOGRAM;  Surgeon: Burnell Blanks, MD;  Location: [facility] C[facility] LAB;  Service: Cardiovascular;  Laterality: N/A;   RADIOACTIVE SEED GUIDED PARTIAL MASTECTOMY WITH AXILLARY SENTINEL LYMPH NODE BIOPSY Right 03/21/2015   Procedure: RIGHT  PARTIAL MASTECTOMY WITH RADIOACTIVE SEED LOCALIZATION RIGHT  AXILLARY SENTINEL LYMPH NODE BIOPSY;  Surgeon: Fanny Skates, MD;  Location: [facility];  Service: General;  Laterality: Right;   REPLACEMENT TOTAL KNEE BILATERAL  2005 &2006   VASCULAR SURGERY Right 03/15/2013   Ultrasound guided sclerotherapy    There were no vitals filed for this visit.   Subjective Assessment - 02/19/21 1037     Subjective Everything is about the same. Exercises are going well. 6/10 on the L knee today.    Patient Stated Goals stand up longer, be able to move and walk, scared to walk by herself.    Currently in Pain? Yes    Pain Score 7  Pain Location Foot    Pain Orientation Right;Left    Pain Descriptors / Indicators Aching;Burning    Pain Type Chronic pain                  [facility] PT Assessment - 02/19/21 0001       Assessment   Medical Diagnosis Plantar fasciitis bilateral    Referring Provider (PT) Evelina Bucy, DPM      Precautions   Precautions Fall    Precaution Comments Patient unsteady with gait and uses rollator for all mobility      Restrictions   Weight Bearing Restrictions No      Balance Screen   Has the patient fallen in the past 6 months No      Prior Function   Level of Independence Independent with household mobility with device;Independent with community mobility with device      Observation/Other Assessments   Focus on Therapeutic Outcomes (FOTO)  64%   assessed  02/04/21     AROM   Right Ankle Dorsiflexion 0    Left Ankle Dorsiflexion 3      Berg Balance Test   Sit to Stand Able to stand without using hands and stabilize independently    Standing Unsupported Able to stand safely 2 minutes    Sitting with Back Unsupported but Feet Supported on Floor or Stool Able to sit safely and securely 2 minutes    Stand to Sit Sits safely with minimal use of hands    Transfers Able to transfer safely, minor use of hands    Standing Unsupported with Eyes Closed Able to stand 10 seconds with supervision    Standing Unsupported with Feet Together Able to place feet together independently and stand for 1 minute with supervision    From Standing, Reach Forward with Outstretched Arm Can reach forward >5 cm safely (2")    From Standing Position, Pick up Object from Floor Unable to try/needs assist to keep balance    From Standing Position, Turn to Look Behind Over each Shoulder Turn sideways only but maintains balance    Turn 360 Degrees Able to turn 360 degrees safely but slowly    Standing Unsupported, Alternately Place Feet on Step/Stool Needs assistance to keep from falling or unable to try    Standing Unsupported, One Foot in Front Needs help to step but can hold 15 seconds    Standing on One Leg Unable to try or needs assist to prevent fall    Total Score 33    Berg comment: medium fall risk for age group and gender                                        [facility] Adult PT Treatment/Exercise - 02/19/21 0001       Self-Care   Other Self-Care Comments  Geographical information systems officer and dorisflexion assessment and then discussion of results      Knee/Hip Exercises: Clinical research associate 2 reps;30 seconds;Both    Gastroc Stretch Limitations with strap sitting EOB      Ankle Exercises: Supine   T-Band 4 way ankle green x20                          PT Education - 02/19/21 1340     Education Details POC update, continue HEP, Berg results,  continue  to focus on dorsiflexion stretching    Person(s) Educated Patient    Methods Explanation;Demonstration    Comprehension Verbalized understanding;Returned demonstration;Need further instruction              PT Short Term Goals - 02/19/21 1128       PT SHORT TERM GOAL #1   Title Pt will be independent with initial HEP    Status Achieved      PT SHORT TERM GOAL #2   Title PT will go over FOTO results with pt.    Status Achieved    Target Date 01/28/21                PT Long Term Goals - 02/19/21 1114       PT LONG TERM GOAL #1   Title Pt will be independent with advanced HEP    Time 4    Period Weeks    Status On-going    Target Date 03/19/21      PT LONG TERM GOAL #2   Title Pt will achieve 34/56 on the Berg Balance test to show a detectable change with balance interventions and to decrease the pts risk for falls.    Baseline 33/56    Time 4    Period Weeks    Status On-going    Target Date 03/19/21      PT LONG TERM GOAL #3   Title Pt will improve bilateral dorsiflexion to 10 degrees in order to decrease pain in bilateral feet.    Baseline 0 degrees on R and 3 degrees on L of dorsiflexion    Time 4    Period Weeks    Status On-going    Target Date 03/19/21      PT LONG TERM GOAL #4   Title Pt will improve FOTO from 49% to predicted 60% in order to show functional improvement.    Baseline 64% on 02/04/21    Status Achieved                        Plan - 02/19/21 1341     Clinical Impression Statement Pt tolerated PT well with no adverse effects. Pt showed progressoin from 28 to 33 on the Berg this session, with deficits still mostly in anticpatory postural adjustments. Pt is still in the range for moderate fall risk and is below the average for her age/gender on scoring for the Berg. Dorsiflexion AROM on the R ankle decreased since last measurement but increased on the L ankle. Pt has only met 1 long term goal out of the 4 that she has.  Continues to benefit from skilled PT in order to decrease fall risk, increase community ambulation, and decrease foot pain.    Comorbidities DM II, essential HTN, gout, Charcot foot, anemia, depression, fibromyalgia, arthritis    Examination-Activity Limitations Stairs;Stand;Locomotion Level;Bend;Lift;Squat;Transfers;Reach Overhead    Examination-Participation Restrictions Community Activity;Laundry;Meal Prep;Cleaning;Shop;Volunteer;Yard Work    Stability/Clinical Decision Making Stable/Uncomplicated    Clinical Decision Making Low    PT Frequency 1x / week    PT Duration 4 weeks    PT Treatment/Interventions Electrical Stimulation;Iontophoresis 80m/ml Dexamethasone;Moist Heat;Cryotherapy;Ultrasound;Gait training;Stair training;Functional mobility training;Therapeutic activities;Therapeutic exercise;Balance training;Neuromuscular re-education;Patient/family education;Manual techniques;Taping;Dry needling;Vasopneumatic Device    PT Next Visit Plan Progress 4 way ankle, gastroc/soleus stretching, perform reactive/APA exercise balance tasks    PT Home Exercise Plan [place] Agree with Plan of Care Patient  Patient will benefit from skilled therapeutic intervention in order to improve the following deficits and impairments:  Decreased balance,Decreased mobility,Difficulty walking,Decreased activity tolerance,Decreased strength,Abnormal gait,Decreased range of motion,Cardiopulmonary status limiting activity,Decreased endurance,Decreased safety awareness,Pain,Impaired flexibility,Increased edema,Increased fascial restricitons  Visit Diagnosis: Pain in left foot  Pain in right foot  Stiffness of right foot, not elsewhere classified  Stiffness of left foot, not elsewhere classified  Other abnormalities of gait and mobility  Muscle weakness (generalized)  Unsteadiness on feet  Gait instability  Chronic midline low back pain, unspecified whether sciatica  present      Problem List Patient Active Problem List   Diagnosis Date Noted   Tinea pedis 04/09/2020   Spinal stenosis of lumbar region without neurogenic claudication 02/08/2019   Nephropathy, diabetic ([place]) 02/07/2019   Gout 05/16/2018   Gait instability 01/03/2018   Frequent bowel movements 11/02/2017   Tremor of both hands    Stuttering    Essential hypertension    Hyperlipidemia 08/13/2015   Breast cancer of lower-outer quadrant of right female breast ([place]) 02/13/2015   Vitamin D deficiency 02/06/2015   Type 2 diabetes mellitus with diabetic foot deformity ([place]) 01/01/2014   Chronic combined systolic and diastolic heart failure ([place]) 02/26/2013   OBESITY 02/05/2009   Asthma, intermittent 02/08/2008   NEPHROLITHIASIS 01/26/2007   DJD (degenerative joint disease), multiple sites 01/26/2007    Caleb Popp, SPT 02/19/2021, 2:44 PM  [facility] [address] [place], [place], [address] Phone: [phone]   Fax:  [phone]  Name: JENITA RAYFIELD MRN: [id number] Date of Birth: May 11, 1951

## 2021-02-23 ENCOUNTER — Ambulatory Visit: Payer: Medicare Other

## 2021-02-23 ENCOUNTER — Other Ambulatory Visit: Payer: Self-pay | Admitting: *Deleted

## 2021-02-23 NOTE — Patient Instructions (Signed)
Goals Addressed            This Visit's Progress   . (THN)Learn More About My Health       Timeframe:  Short-Term Goal Priority:  Medium Start Date:  24580998                 Expected End Date:  33825053          Follow Up Date 97673419   - make a list of questions - bring a list of my medicines to the visit - speak up when I don't understand    Why is this important?   The best way to learn about your health and care is by talking to the doctor and nurse.  They will answer your questions and give you information in the way that you like best.    Notes:     . [facility] and Keep All Appointments       Timeframe:  Long-Range Goal Priority:  High Start Date:     37902409                        Expected End Date:  73532992 Follow Up Date 42683419   - arrange a ride through an agency 1 week before appointment - call to cancel if needed - keep a calendar with prescription refill dates - keep a calendar with appointment dates    Why is this important?   Part of staying healthy is seeing the doctor for follow-up care.  If you forget your appointments, there are some things you can do to stay on track.    Notes:  Per patient she had to cancel a rehab appointment due to the foot pain.    . (THN)Monitor and Manage My Blood Sugar   On track    Timeframe:  Long-Range Goal Priority:  High Start Date:       62229798                      Expected End Date:    92119417             Follow Up Date 40814481   - check blood sugar at prescribed times - check blood sugar if I feel it is too high or too low - enter blood sugar readings and medication or insulin into daily log - take the blood sugar log to all doctor visits - take the blood sugar meter to all doctor visits    Why is this important?   Checking your blood sugar at home helps to keep it from getting very high or very low.  Writing the results in a diary or log helps the doctor know how to care for you.  Your blood sugar  log should have the time, date and the results.  Also, write down the amount of insulin or other medicine that you take.  Other information, like what you ate, exercise done and how you were feeling, will also be helpful.     Notes:     . [facility] Eye Exam   On track    Timeframe:  Long-Range Goal Priority:  Medium Start Date:  85631497                           Expected End Date:  02637858              Follow Up Date  36644034   - schedule appointment with eye doctor    Why is this important?   Eye check-ups are important when you have diabetes.  Vision loss can be prevented.    Notes:  Last eye exam  Eye exam [id number]     . (THN)Perform Foot Care   On track    Timeframe:  Long-Range Goal Priority:  Medium Start Date: 87564332                            Expected End Date: 95188416                     Follow Up Date 60630160   - check feet daily for cuts, sores or redness - keep feet up while sitting - trim toenails straight across - wash and dry feet carefully every day - wear comfortable, cotton socks - wear comfortable, well-fitting shoes    Why is this important?   Good foot care is very important when you have diabetes.  There are many things you can do to keep your feet healthy and catch a problem early.    Notes:  Last podiatry appointment [id number]    . (THN)Set My Target A1C   On track    Timeframe:  Short-Term Goal Priority:  High Start Date:   73220254                          Expected End Date:   27062376          Follow Up Date 283151761   - set target A1C    Why is this important?   Your target A1C is decided together by you and your doctor.  It is based on several things like your age and other health issues.    Notes:  7.0  now A1C 8.3 A1C 7.1 decrease from 8.3

## 2021-02-23 NOTE — Patient Outreach (Signed)
[facility]) Care Management  [place]  8/76/8115   CLIFTON KOVACIC 06/23/2036 [phone] RN Health Coach telephone call to patient.  Hip[employer] compliance verified. Patient A1C is 7.1. Per patient she has lost 10 pounds. She has made changes in her diet. Patient has decreased salt and sweet foods. She has been moving around more. Patient is going to rehab. Per patient she fell at church with no injuries. Patient has agrees to follow up outreach calls.   Encounter Medications:  Outpatient Encounter Medications as of 02/23/2021  Medication Sig Note  . acetaminophen (TYLENOL) 650 MG CR tablet Take 650 mg by mouth every 8 (eight) hours as needed for pain. 08/14/2019: Takes 2 tablets in the morning, 1 tablet at noon, and 2 tablets at nights  . allopurinol (ZYLOPRIM) 100 MG tablet TAKE 1 TABLET BY MOUTH  DAILY   . atorvastatin (LIPITOR) 40 MG tablet TAKE 1 TABLET BY MOUTH  DAILY   . BD AUTOSHIELD DUO 30G X 5 MM MISC USE ONCE DAILY TO INJECT  INSULIN AS DIRECTED   . Blood Glucose Monitoring Suppl (ONETOUCH VERIO FLEX SYSTEM) w/Device KIT USE AS DIRECTED   . colchicine 0.6 MG tablet TAKE 1 TABLET BY MOUTH  DAILY   . dorzolamide (TRUSOPT) 2 % ophthalmic solution 1 drop daily.   . dorzolamide-timolol (COSOPT) 22.3-6.8 MG/ML ophthalmic solution Place 1 drop into both eyes 2 (two) times daily.   . empagliflozin (JARDIANCE) 25 MG TABS tablet Take 1 tablet (25 mg total) by mouth daily.   Marland Kitchen FLOVENT HFA 110 MCG/ACT inhaler USE 2 INHALATIONS BY MOUTH  TWICE DAILY   . gabapentin (NEURONTIN) 100 MG capsule TAKE 2 CAPSULES BY MOUTH 2 TIMES DAILY as needed   . Insulin Pen Needle (PEN NEEDLES 5/16") 30G X 8 MM MISC Use to inject insulin as directed.  Dx code:E11.69   . Lancet Devices (ONETOUCH DELICA PLUS LANCING) MISC USE ONCE DAILY   . latanoprost (XALATAN) 0.005 % ophthalmic solution Place 1 drop into both eyes at bedtime.    Marland Kitchen losartan (COZAAR) 100 MG tablet Take 0.5 tablets (50 mg  total) by mouth at bedtime.   . meloxicam (MOBIC) 7.5 MG tablet Take 1 tablet (7.5 mg total) by mouth daily.   . metFORMIN (GLUCOPHAGE-XR) 500 MG 24 hr tablet Take 1 tablet (500 mg total) by mouth daily with breakfast.   . metoprolol succinate (TOPROL-XL) 25 MG 24 hr tablet TAKE 1 TABLET BY MOUTH  DAILY   . Miconazole Nitrate (LOTRIMIN AF) 2 % AERO Spray between toes once daily for 4 weeks.   Marland Kitchen neomycin (MYCIFRADIN) 500 MG tablet Take by mouth 3 (three) times daily.   Marland Kitchen neomycin-polymyxin b-dexamethasone (MAXITROL) 3.5-10000-0.1 SUSP    . OneTouch Delica Lancets 53M MISC 1 Container by Does not apply route daily as needed.   [name] test strip CHECK BLOOD SUGAR IN THE  MORNING AS DIRECTED   . spironolactone (ALDACTONE) 25 MG tablet TAKE 1 TABLET BY MOUTH  DAILY   . VICTOZA 18 MG/3ML SOPN INJECT 1.8 MG (0.3 ML)  SUBCUTANEOUSLY DAILY    No facility-administered encounter medications on file as of 02/23/2021.    Functional Status:  No flowsheet data found.  Fall/Depression Screening: Fall Risk  02/23/2021 01/27/2021 01/27/2021  Falls in the past year? 1 0 0  Number falls in past yr: 0 0 0  Injury with Fall? 0 0 0  Risk Factor Category  - - -  Risk for fall  due to : History of fall(s);Impaired balance/gait;Impaired mobility - -  Risk for fall due to: Comment - - -  Follow up Falls evaluation completed - -  Comment - - -   PHQ 2/9 Scores 01/27/2021 10/29/2020 05/06/2020 02/21/2020 02/13/2020 11/27/2019 11/13/2019  PHQ - 2 Score 2 2 1  0 0 1 1  PHQ- 9 Score 7 7 - - - - -    Assessment:  Goals Addressed            This Visit's Progress   . ([facility])Learn More About My Health       Timeframe:  Short-Term Goal Priority:  Medium Start Date:  05397673                 Expected End Date:  41937902          Follow Up Date 40973532   - make a list of questions - bring a list of my medicines to the visit - speak up when I don't understand    Why is this important?   The best way to learn  about your health and care is by talking to the doctor and nurse.  They will answer your questions and give you information in the way that you like best.    Notes:     . [facility] and Keep All Appointments       Timeframe:  Long-Range Goal Priority:  High Start Date:     99242683                        Expected End Date:  41962229 Follow Up Date 79892119   - arrange a ride through an agency 1 week before appointment - call to cancel if needed - keep a calendar with prescription refill dates - keep a calendar with appointment dates    Why is this important?   Part of staying healthy is seeing the doctor for follow-up care.  If you forget your appointments, there are some things you can do to stay on track.    Notes:  Per patient she had to cancel a rehab appointment due to the foot pain.    . (THN)Monitor and Manage My Blood Sugar   On track    Timeframe:  Long-Range Goal Priority:  High Start Date:       41740814                      Expected End Date:    48185631             Follow Up Date 49702637   - check blood sugar at prescribed times - check blood sugar if I feel it is too high or too low - enter blood sugar readings and medication or insulin into daily log - take the blood sugar log to all doctor visits - take the blood sugar meter to all doctor visits    Why is this important?   Checking your blood sugar at home helps to keep it from getting very high or very low.  Writing the results in a diary or log helps the doctor know how to care for you.  Your blood sugar log should have the time, date and the results.  Also, write down the amount of insulin or other medicine that you take.  Other information, like what you ate, exercise done and how you were feeling, will also be helpful.     Notes:     Marland Kitchen (  THN)Obtain Eye Exam   On track    Timeframe:  Long-Range Goal Priority:  Medium Start Date:  93810175                           Expected End Date:  10258527               Follow Up Date 78242353   - schedule appointment with eye doctor    Why is this important?   Eye check-ups are important when you have diabetes.  Vision loss can be prevented.    Notes:  Last eye exam  Eye exam [id number]     . (THN)Perform Foot Care   On track    Timeframe:  Long-Range Goal Priority:  Medium Start Date: 00867619                            Expected End Date: 50932671                     Follow Up Date 24580998   - check feet daily for cuts, sores or redness - keep feet up while sitting - trim toenails straight across - wash and dry feet carefully every day - wear comfortable, cotton socks - wear comfortable, well-fitting shoes    Why is this important?   Good foot care is very important when you have diabetes.  There are many things you can do to keep your feet healthy and catch a problem early.    Notes:  Last podiatry appointment [id number]    . (THN)Set My Target A1C   On track    Timeframe:  Short-Term Goal Priority:  High Start Date:   97673419                          Expected End Date:   37902409          Follow Up Date 735329924   - set target A1C    Why is this important?   Your target A1C is decided together by you and your doctor.  It is based on several things like your age and other health issues.    Notes:  7.0  now A1C 8.3 A1C 7.1 decrease from 8.3       Plan:  Follow-up:  Patient agrees to Care Plan and Follow-up. RN provided a new calendar book RN assisted in ordering free COVID vaccines Patient will continue with rehab RN will follow within the month of September RN will send an update assessment to PCP  [place] [facility] [phone]

## 2021-02-24 ENCOUNTER — Encounter: Payer: Self-pay | Admitting: Physical Therapy

## 2021-02-24 ENCOUNTER — Other Ambulatory Visit: Payer: Self-pay

## 2021-02-24 ENCOUNTER — Ambulatory Visit: Payer: Medicare Other | Admitting: Physical Therapy

## 2021-02-24 DIAGNOSIS — M6281 Muscle weakness (generalized): Secondary | ICD-10-CM

## 2021-02-24 DIAGNOSIS — R2681 Unsteadiness on feet: Secondary | ICD-10-CM

## 2021-02-24 DIAGNOSIS — M25674 Stiffness of right foot, not elsewhere classified: Secondary | ICD-10-CM | POA: Diagnosis not present

## 2021-02-24 DIAGNOSIS — R2689 Other abnormalities of gait and mobility: Secondary | ICD-10-CM | POA: Diagnosis not present

## 2021-02-24 DIAGNOSIS — M545 Low back pain, unspecified: Secondary | ICD-10-CM | POA: Diagnosis not present

## 2021-02-24 DIAGNOSIS — G8929 Other chronic pain: Secondary | ICD-10-CM | POA: Diagnosis not present

## 2021-02-24 DIAGNOSIS — M79671 Pain in right foot: Secondary | ICD-10-CM | POA: Diagnosis not present

## 2021-02-24 DIAGNOSIS — M25675 Stiffness of left foot, not elsewhere classified: Secondary | ICD-10-CM | POA: Diagnosis not present

## 2021-02-24 DIAGNOSIS — M79672 Pain in left foot: Secondary | ICD-10-CM

## 2021-02-24 NOTE — Therapy (Addendum)
[place], [place], [address] Phone: [phone]   Fax:  [phone]  Physical Therapy Treatment  Patient Details  Name: [name] MRN: [id number] Date of Birth: 12-Oct-1951 Referring Provider (PT): Evelina Bucy, DPM   Encounter Date: 02/24/2021   PT End of Session - 02/24/21 0928     Visit Number 9    Number of Visits 16    Date for PT Re-Evaluation 03/19/21    Authorization Type [facility] MCR/ MCD 2022    Authorization Time Period kx by 15    Progress Note Due on Visit 18    PT Start Time 0926   pt late   PT Stop Time 9758    PT Time Calculation (min) 36 min    Equipment Utilized During Treatment Gait belt    Activity Tolerance Patient tolerated treatment well    Behavior During Therapy [facility] for tasks assessed/performed             Past Medical History:  Diagnosis Date   Anemia    Arthritis    Asthma    Blood transfusion without reported diagnosis    Patients believes 2015 when she overdosed on "medications"    Breast cancer of lower-outer quadrant of right female breast ([place]) 02/13/2015   Cataract    CHF, acute ([place]) 05/03/2012   Depression    Diabetes mellitus    Eczema    Fatty liver    Fatty infiltration of liver noted on 03/2012 CT scan   Fibromyalgia    Glaucoma    Heart disease    History of kidney stones    w/ hx of hydronephrosis - followed by [facility]   HIV nonspecific serology    2006: indeterminate HIV blood test, seen by ID, felt secondary to cross reacting antibodies with no further workup felt necessary at that time    Hypertension    Obesity    Personal history of radiation therapy 2016   right   Radiation 05/07/15-06/23/15   Right Breast    Past Surgical History:  Procedure Laterality Date   BREAST BIOPSY Left 02/10/2015   malignant    BREAST LUMPECTOMY Right 03/21/2015   CHOLECYSTECTOMY  2003   CYSTOSCOPY W/ LITHOLAPAXY / EHL     JOINT REPLACEMENT      bilateral knee replacement   LEFT AND RIGHT HEART CATHETERIZATION WITH CORONARY ANGIOGRAM N/A 05/02/2012   Procedure: LEFT AND RIGHT HEART CATHETERIZATION WITH CORONARY ANGIOGRAM;  Surgeon: Burnell Blanks, MD;  Location[facility] CATH LAB;  Service: Cardiovascular;  Laterality: N/A;   RADIOACTIVE SEED GUIDED PARTIAL MASTECTOMY WITH AXILLARY SENTINEL LYMPH NODE BIOPSY Right 03/21/2015   Procedure: RIGHT  PARTIAL MASTECTOMY WITH RADIOACTIVE SEED LOCALIZATION RIGHT  AXILLARY SENTINEL LYMPH NODE BIOPSY;  Surgeon: Fanny Skates, MD;  Location: [facility];  Service: General;  Laterality: Right;   REPLACEMENT TOTAL KNEE BILATERAL  2005 &2006   VASCULAR SURGERY Right 03/15/2013   Ultrasound guided sclerotherapy    There were no vitals filed for this visit.   Subjective Assessment - 02/24/21 0929     Subjective My feet are feeling a little better today. I am doing the exercises with no problems. The fall that [facility] reported in their note was from back in December, the one I told you about on the first visit. I haven't had falls since that one.    Currently in Pain? Yes    Pain Score 4  Pain Location Foot    Pain Orientation Right;Left    Pain Descriptors / Indicators Aching;Burning    Pain Type Chronic pain    Pain Onset More than a month ago    Pain Frequency Constant                                               [facility] Adult PT Treatment/Exercise - 02/24/21 0001       High Level Balance   High Level Balance Comments sit to stands 2x10; clock taps 2x bilateral; counter reaches to cones reaching forward x20; rhomberg 3/4 x30 seconds; reaching anteroinferior to mat alternating touches to cone 4x10 (progressed from 8" step cone taps on bariatic plinth to cone taps on plinth); 3x10 rotations and D2 flexion with gray ball bilaterally      Knee/Hip Exercises: Seated   Sit to Sand 2 sets;10 reps;without UE support                          PT  Education - 02/24/21 1206     Education Details HEP, continue to focus more on 3/4 standing tandem exercise at home as pt wasn't doing it as much    Person(s) Educated Patient    Methods Explanation;Demonstration;Tactile cues;Verbal cues    Comprehension Tactile cues required;Need further instruction;Verbal cues required;Returned demonstration;Verbalized understanding              PT Short Term Goals - 02/19/21 1128       PT SHORT TERM GOAL #1   Title Pt will be independent with initial HEP    Status Achieved      PT SHORT TERM GOAL #2   Title PT will go over FOTO results with pt.    Status Achieved    Target Date 01/28/21                PT Long Term Goals - 02/19/21 1114       PT LONG TERM GOAL #1   Title Pt will be independent with advanced HEP    Time 4    Period Weeks    Status On-going    Target Date 03/19/21      PT LONG TERM GOAL #2   Title Pt will achieve 34/56 on the Berg Balance test to show a detectable change with balance interventions and to decrease the pts risk for falls.    Baseline 33/56    Time 4    Period Weeks    Status On-going    Target Date 03/19/21      PT LONG TERM GOAL #3   Title Pt will improve bilateral dorsiflexion to 10 degrees in order to decrease pain in bilateral feet.    Baseline 0 degrees on R and 3 degrees on L of dorsiflexion    Time 4    Period Weeks    Status On-going    Target Date 03/19/21      PT LONG TERM GOAL #4   Title Pt will improve FOTO from 49% to predicted 60% in order to show functional improvement.    Baseline 64% on 02/04/21    Status Achieved                        Plan - 02/24/21 1208  Clinical Impression Statement Pt tolerated PT session well with no adverse effects. Session focused on balance with focus of limitations in subsystems on biomechanical limits of stability, somatosensory, and anticipatory/voluntary control. Pt showed some dizziness with reaching forward during cone taps  but dissipated with rest; pt states that she gets dizziness occasionally with reaching forward. Showed increased trunk rotation as compared to what was seen during Berg assessment last visit when using UE task of rotating ball side to side. Pt showed the most difficulty with challenging her limits of stability with reaching tasks. Continues to benefit from skilled PT in order to decrease fall risk/increase balance and increase ankle/foot intrinsic strength.    PT Treatment/Interventions Electrical Stimulation;Iontophoresis 4mg /ml Dexamethasone;Moist Heat;Cryotherapy;Ultrasound;Gait training;Stair training;Functional mobility training;Therapeutic activities;Therapeutic exercise;Balance training;Neuromuscular re-education;Patient/family education;Manual techniques;Taping;Dry needling;Vasopneumatic Device    PT Next Visit Plan Progress 4 way ankle, gastroc/soleus stretching, perform reactive/APA exercise balance tasks    PT Home Exercise Plan [id number]    Consulted and Agree with Plan of Care Patient             Patient will benefit from skilled therapeutic intervention in order to improve the following deficits and impairments:  Decreased balance,Decreased mobility,Difficulty walking,Decreased activity tolerance,Decreased strength,Abnormal gait,Decreased range of motion,Cardiopulmonary status limiting activity,Decreased endurance,Decreased safety awareness,Pain,Impaired flexibility,Increased edema,Increased fascial restricitons  Visit Diagnosis: Pain in left foot  Pain in right foot  Stiffness of right foot, not elsewhere classified  Stiffness of left foot, not elsewhere classified  Muscle weakness (generalized)  Other abnormalities of gait and mobility  Unsteadiness on feet      Problem List Patient Active Problem List   Diagnosis Date Noted   Tinea pedis 04/09/2020   Spinal stenosis of lumbar region without neurogenic claudication 02/08/2019   Nephropathy, diabetic ([place])  02/07/2019   Gout 05/16/2018   Gait instability 01/03/2018   Frequent bowel movements 11/02/2017   Tremor of both hands    Stuttering    Essential hypertension    Hyperlipidemia 08/13/2015   Breast cancer of lower-outer quadrant of right female breast ([place]) 02/13/2015   Vitamin D deficiency 02/06/2015   Type 2 diabetes mellitus with diabetic foot deformity ([place]) 01/01/2014   Chronic combined systolic and diastolic heart failure ([place]) 02/26/2013   OBESITY 02/05/2009   Asthma, intermittent 02/08/2008   NEPHROLITHIASIS 01/26/2007   DJD (degenerative joint disease), multiple sites 01/26/2007    Caleb Popp, SPT 02/24/2021, 12:14 PM  [facility] [address] [place], [place], [address] Phone: [phone]   Fax:  [phone]  Name: [name] MRN: [id number] Date of Birth: Apr 24, 1951

## 2021-03-03 ENCOUNTER — Other Ambulatory Visit: Payer: Self-pay

## 2021-03-03 ENCOUNTER — Ambulatory Visit: Payer: Medicare Other | Attending: Family Medicine | Admitting: Physical Therapy

## 2021-03-03 ENCOUNTER — Encounter: Payer: Self-pay | Admitting: Physical Therapy

## 2021-03-03 DIAGNOSIS — M79672 Pain in left foot: Secondary | ICD-10-CM | POA: Insufficient documentation

## 2021-03-03 DIAGNOSIS — R2689 Other abnormalities of gait and mobility: Secondary | ICD-10-CM | POA: Insufficient documentation

## 2021-03-03 DIAGNOSIS — M25675 Stiffness of left foot, not elsewhere classified: Secondary | ICD-10-CM | POA: Insufficient documentation

## 2021-03-03 DIAGNOSIS — M79671 Pain in right foot: Secondary | ICD-10-CM | POA: Insufficient documentation

## 2021-03-03 DIAGNOSIS — M25674 Stiffness of right foot, not elsewhere classified: Secondary | ICD-10-CM | POA: Diagnosis not present

## 2021-03-03 DIAGNOSIS — R2681 Unsteadiness on feet: Secondary | ICD-10-CM | POA: Diagnosis not present

## 2021-03-03 DIAGNOSIS — M6281 Muscle weakness (generalized): Secondary | ICD-10-CM | POA: Insufficient documentation

## 2021-03-03 NOTE — Therapy (Addendum)
[place], [place], [address] Phone: [phone]   Fax:  [phone]  Physical Therapy Treatment  Patient Details  Name: [name] MRN: [id number] Date of Birth: 1950-12-18 Referring Provider (PT): Evelina Bucy, DPM   Encounter Date: 03/03/2021   PT End of Session - 03/03/21 1048     Visit Number 10    Number of Visits 16    Date for PT Re-Evaluation 03/19/21    Authorization Type [facility] MCR/ MCD 2022    Progress Note Due on Visit 18    PT Start Time 1000    PT Stop Time 1045    PT Time Calculation (min) 45 min    Equipment Utilized During Treatment Gait belt    Activity Tolerance Patient tolerated treatment well    Behavior During Therapy [facility] for tasks assessed/performed             Past Medical History:  Diagnosis Date   Anemia    Arthritis    Asthma    Blood transfusion without reported diagnosis    Patients believes 2015 when she overdosed on "medications"    Breast cancer of lower-outer quadrant of right female breast ([place]) 02/13/2015   Cataract    CHF, acute ([place]) 05/03/2012   Depression    Diabetes mellitus    Eczema    Fatty liver    Fatty infiltration of liver noted on 03/2012 CT scan   Fibromyalgia    Glaucoma    Heart disease    History of kidney stones    w/ hx of hydronephrosis - followed by [facility]   HIV nonspecific serology    2006: indeterminate HIV blood test, seen by ID, felt secondary to cross reacting antibodies with no further workup felt necessary at that time    Hypertension    Obesity    Personal history of radiation therapy 2016   right   Radiation 05/07/15-06/23/15   Right Breast    Past Surgical History:  Procedure Laterality Date   BREAST BIOPSY Left 02/10/2015   malignant    BREAST LUMPECTOMY Right 03/21/2015   CHOLECYSTECTOMY  2003   CYSTOSCOPY W/ LITHOLAPAXY / EHL     JOINT REPLACEMENT     bilateral knee replacement   LEFT AND RIGHT  HEART CATHETERIZATION WITH CORONARY ANGIOGRAM N/A 05/02/2012   Procedure: LEFT AND RIGHT HEART CATHETERIZATION WITH CORONARY ANGIOGRAM;  Surgeon: Burnell Blanks, MD;  Location: [facility] CATH LAB;  Service: Cardiovascular;  Laterality: N/A;   RADIOACTIVE SEED GUIDED PARTIAL MASTECTOMY WITH AXILLARY SENTINEL LYMPH NODE BIOPSY Right 03/21/2015   Procedure: RIGHT  PARTIAL MASTECTOMY WITH RADIOACTIVE SEED LOCALIZATION RIGHT  AXILLARY SENTINEL LYMPH NODE BIOPSY;  Surgeon: Fanny Skates, MD;  Location: [place];  Service: General;  Laterality: Right;   REPLACEMENT TOTAL KNEE BILATERAL  2005 &2006   VASCULAR SURGERY Right 03/15/2013   Ultrasound guided sclerotherapy    There were no vitals filed for this visit.   Subjective Assessment - 03/03/21 1002     Subjective My feet are doing better. I have been doing my exercises. I'm not sure what is different but I am glad my foot pain is less.    Pain Score 3     Pain Location Foot    Pain Orientation Right;Left    Pain Descriptors / Indicators Aching    Pain Onset More than a month ago    Pain Frequency Constant  [facility] Adult PT Treatment/Exercise - 03/03/21 0001       Neuro Re-ed    Neuro Re-ed Details  3/4 tandem x30" eyes closed, sit to stands focusing on not using momentum and instead LE strength 3x10 from 2" step, forward reaches to cone on plinth 3x10, lateral reaches bilateral to cone on counter 2x10, marches onto a 4" step 2x10, turns side to side with arms out with weighted yellow ball 2x10 (R and then L side), overhead ball raches red ball and then nonweighted ball 2x10 with stance in rhomberg, clock taps 2x2 on both legs      Knee/Hip Exercises: Stretches   Gastroc Stretch 2 reps;30 seconds;Both    Gastroc Stretch Limitations with strap sitting EOB                          PT Education - 03/03/21 1129     Education Details HEP update     Person(s) Educated Patient    Methods Explanation;Demonstration;Handout;Verbal cues    Comprehension Verbalized understanding;Returned demonstration;Need further instruction              PT Short Term Goals - 02/19/21 1128       PT SHORT TERM GOAL #1   Title Pt will be independent with initial HEP    Status Achieved      PT SHORT TERM GOAL #2   Title PT will go over FOTO results with pt.    Status Achieved    Target Date 01/28/21                PT Long Term Goals - 02/19/21 1114       PT LONG TERM GOAL #1   Title Pt will be independent with advanced HEP    Time 4    Period Weeks    Status On-going    Target Date 03/19/21      PT LONG TERM GOAL #2   Title Pt will achieve 34/56 on the Berg Balance test to show a detectable change with balance interventions and to decrease the pts risk for falls.    Baseline 33/56    Time 4    Period Weeks    Status On-going    Target Date 03/19/21      PT LONG TERM GOAL #3   Title Pt will improve bilateral dorsiflexion to 10 degrees in order to decrease pain in bilateral feet.    Baseline 0 degrees on R and 3 degrees on L of dorsiflexion    Time 4    Period Weeks    Status On-going    Target Date 03/19/21      PT LONG TERM GOAL #4   Title Pt will improve FOTO from 49% to predicted 60% in order to show functional improvement.    Baseline 64% on 02/04/21    Status Achieved                        Plan - 03/03/21 1130     Clinical Impression Statement Pt tolerated PT session well with no adverse effects. Sit to stands focused on using less momentum. Various balance challengs challenging limits of stability as well as anticipatory/voluntary control was done this session. Some exercises also focused on somatosensory and visual challenges. Continues to benefit from skilled PT in order to address reactive, anticipatory, biomechanical, and limits of stability parts of balance as well as decreased foot pain to  increase QOL  and decrease fall risk.    PT Treatment/Interventions Electrical Stimulation;Iontophoresis 4mg /ml Dexamethasone;Moist Heat;Cryotherapy;Ultrasound;Gait training;Stair training;Functional mobility training;Therapeutic activities;Therapeutic exercise;Balance training;Neuromuscular re-education;Patient/family education;Manual techniques;Taping;Dry needling;Vasopneumatic Device    PT Next Visit Plan Progress 4 way ankle, gastroc/soleus stretching, perform reactive/APA exercise balance tasks    PT Home Exercise Plan [email or web address]    Consulted and Agree with Plan of Care Patient             Patient will benefit from skilled therapeutic intervention in order to improve the following deficits and impairments:  Decreased balance,Decreased mobility,Difficulty walking,Decreased activity tolerance,Decreased strength,Abnormal gait,Decreased range of motion,Cardiopulmonary status limiting activity,Decreased endurance,Decreased safety awareness,Pain,Impaired flexibility,Increased edema,Increased fascial restricitons  Visit Diagnosis: Pain in left foot  Pain in right foot  Stiffness of right foot, not elsewhere classified  Stiffness of left foot, not elsewhere classified  Muscle weakness (generalized)  Other abnormalities of gait and mobility  Unsteadiness on feet      Problem List Patient Active Problem List   Diagnosis Date Noted   Tinea pedis 04/09/2020   Spinal stenosis of lumbar region without neurogenic claudication 02/08/2019   Nephropathy, diabetic ([place]) 02/07/2019   Gout 05/16/2018   Gait instability 01/03/2018   Frequent bowel movements 11/02/2017   Tremor of both hands    Stuttering    Essential hypertension    Hyperlipidemia 08/13/2015   Breast cancer of lower-outer quadrant of right female breast ([place]) 02/13/2015   Vitamin D deficiency 02/06/2015   Type 2 diabetes mellitus with diabetic foot deformity ([place]) 01/01/2014   Chronic combined systolic and diastolic heart failure  ([place]) 02/26/2013   OBESITY 02/05/2009   Asthma, intermittent 02/08/2008   NEPHROLITHIASIS 01/26/2007   DJD (degenerative joint disease), multiple sites 01/26/2007    Caleb Popp, SPT 03/03/2021, 11:39 AM  [facility] [address] [place], [place], [address] Phone: [phone]   Fax:  [phone]  Name: [name] MRN: [id number] Date of Birth: 1950-12-18

## 2021-03-03 NOTE — Patient Instructions (Signed)
Access Code: [email or web address] URL: [email or web address] Date: 03/03/2021 Prepared by: [place] with Counter Support - 1 x daily - 7 x weekly - 3 sets - 10-15 reps Standing Romberg to 3/4 Tandem Stance - 1 x daily - 7 x weekly - 3 reps - 30 seconds hold Sit to Stand with Armchair - 1 x daily - 7 x weekly - 3 sets - 10 reps Standing Gastroc Stretch at Counter - 2-3 x daily - 7 x weekly - 3 reps - 30 seconds hold Seated Plantar Fascia Mobilization with Small Ball - 1-2 x daily - 7 x weekly Isometric Ankle Inversion - 1 x daily - 7 x weekly - 2 sets - 10 reps Seated Ankle Dorsiflexion with Resistance - 1 x daily - 7 x weekly - 2 sets - 10 reps Seated Ankle Plantarflexion with Resistance - 1 x daily - 7 x weekly - 2 sets - 10 reps Seated Ankle Eversion with Resistance - 1 x daily - 7 x weekly - 2 sets - 10 reps Long Sitting Calf Stretch with Strap - 1-2 x daily - 7 x weekly - 3 reps - 30 seconds hold Standing March with Counter Support - 1 x daily - 7 x weekly - 3 sets - 10 reps Standing Hip Abduction with Counter Support - 1 x daily - 7 x weekly - 3 sets - 10 reps Standing Hip Extension with Counter Support - 1 x daily - 7 x weekly - 3 sets - 10 reps Single Leg Balance with Clock Reach - 1 x daily - 7 x weekly - 2 sets - 10 reps

## 2021-03-06 ENCOUNTER — Ambulatory Visit
Admission: RE | Admit: 2021-03-06 | Discharge: 2021-03-06 | Disposition: A | Discharge status: Home / Self Care | Payer: Medicare Other | Source: Ambulatory Visit | Attending: Nurse Practitioner | Admitting: Nurse Practitioner

## 2021-03-06 ENCOUNTER — Other Ambulatory Visit: Payer: Self-pay

## 2021-03-06 DIAGNOSIS — C50511 Malignant neoplasm of lower-outer quadrant of right female breast: Secondary | ICD-10-CM

## 2021-03-06 DIAGNOSIS — Z1231 Encounter for screening mammogram for malignant neoplasm of breast: Secondary | ICD-10-CM | POA: Diagnosis not present

## 2021-03-06 HISTORY — DX: Malignant neoplasm of unspecified site of unspecified female breast: C50.919

## 2021-03-09 ENCOUNTER — Other Ambulatory Visit: Payer: Self-pay | Admitting: *Deleted

## 2021-03-09 MED ORDER — ONETOUCH DELICA PLUS LANCING MISC
0 refills | Status: DC
Start: 2021-03-09 — End: 2021-04-03

## 2021-03-10 ENCOUNTER — Encounter: Payer: Self-pay | Admitting: Physical Therapy

## 2021-03-10 ENCOUNTER — Other Ambulatory Visit: Payer: Self-pay

## 2021-03-10 ENCOUNTER — Ambulatory Visit: Payer: Medicare Other | Admitting: Physical Therapy

## 2021-03-10 DIAGNOSIS — M79672 Pain in left foot: Secondary | ICD-10-CM

## 2021-03-10 DIAGNOSIS — M25674 Stiffness of right foot, not elsewhere classified: Secondary | ICD-10-CM

## 2021-03-10 DIAGNOSIS — M6281 Muscle weakness (generalized): Secondary | ICD-10-CM

## 2021-03-10 DIAGNOSIS — M25675 Stiffness of left foot, not elsewhere classified: Secondary | ICD-10-CM

## 2021-03-10 DIAGNOSIS — M79671 Pain in right foot: Secondary | ICD-10-CM

## 2021-03-10 DIAGNOSIS — R2689 Other abnormalities of gait and mobility: Secondary | ICD-10-CM

## 2021-03-10 DIAGNOSIS — R2681 Unsteadiness on feet: Secondary | ICD-10-CM

## 2021-03-10 NOTE — Therapy (Addendum)
[place], [place], [address] Phone: [phone]   Fax:  [phone]  Physical Therapy Treatment  Patient Details  Name: [name] MRN: [id number] Date of Birth: 02-03-1951 Referring Provider (PT): Evelina Bucy, DPM   Encounter Date: 03/10/2021   PT End of Session - 03/10/21 1013     Visit Number 11    Number of Visits 16    Date for PT Re-Evaluation 03/19/21    Authorization Type [facility] MCR/ MCD 2022    Authorization Time Period kx by 15    Progress Note Due on Visit 18    PT Start Time 1007    PT Stop Time 4235    PT Time Calculation (min) 42 min    Equipment Utilized During Treatment Gait belt    Activity Tolerance Patient tolerated treatment well    Behavior During Therapy WFL for tasks assessed/performed             Past Medical History:  Diagnosis Date   Anemia    Arthritis    Asthma    Blood transfusion without reported diagnosis    Patients believes 2015 when she overdosed on "medications"    Breast cancer ([place])    Breast cancer of lower-outer quadrant of right female breast ([place]) 02/13/2015   Cataract    CHF, acute ([place]) 05/03/2012   Depression    Diabetes mellitus    Eczema    Fatty liver    Fatty infiltration of liver noted on 03/2012 CT scan   Fibromyalgia    Glaucoma    Heart disease    History of kidney stones    w/ hx of hydronephrosis - followed by [facility]   HIV nonspecific serology    2006: indeterminate HIV blood test, seen by ID, felt secondary to cross reacting antibodies with no further workup felt necessary at that time    Hypertension    Obesity    Personal history of radiation therapy 2016   right   Radiation 05/07/15-06/23/15   Right Breast    Past Surgical History:  Procedure Laterality Date   BREAST BIOPSY Left 02/10/2015   malignant    BREAST LUMPECTOMY Right 03/21/2015   CHOLECYSTECTOMY  2003   CYSTOSCOPY W/ LITHOLAPAXY / EHL     JOINT  REPLACEMENT     bilateral knee replacement   LEFT AND RIGHT HEART CATHETERIZATION WITH CORONARY ANGIOGRAM N/A 05/02/2012   Procedure: LEFT AND RIGHT HEART CATHETERIZATION WITH CORONARY ANGIOGRAM;  Surgeon: Burnell Blanks, MD;  Location: [facility] C[facility] LAB;  Service: Cardiovascular;  Laterality: N/A;   RADIOACTIVE SEED GUIDED PARTIAL MASTECTOMY WITH AXILLARY SENTINEL LYMPH NODE BIOPSY Right 03/21/2015   Procedure: RIGHT  PARTIAL MASTECTOMY WITH RADIOACTIVE SEED LOCALIZATION RIGHT  AXILLARY SENTINEL LYMPH NODE BIOPSY;  Surgeon: Fanny Skates, MD;  Location: [place];  Service: General;  Laterality: Right;   REPLACEMENT TOTAL KNEE BILATERAL  2005 &2006   VASCULAR SURGERY Right 03/15/2013   Ultrasound guided sclerotherapy    There were no vitals filed for this visit.   Subjective Assessment - 03/10/21 1009     Subjective Exercises are going okay. Pt reports compliance with exercises and nothing new.    Currently in Pain? Yes    Pain Score 4     Pain Location Foot    Pain Orientation Right;Left    Pain Descriptors / Indicators Aching    Pain Type Chronic pain    Pain Onset More  than a month ago    Pain Frequency Constant                                               [facility] Adult PT Treatment/Exercise - 03/10/21 0001       Dynamic Standing Balance   Dynamic Standing - Comments sit to stand from 2" step on geriatric plint (focusing on decreasing momentum used) 2x10; trunk rotations with arms out front with gray ball 2x10 bilateraly; cone reaches forward to geriatric plint alternating arms 2x10; lateral reaches to cone on counter 2x10 bilaterally; cone taps from counter to 1st and second shelf with R arm with feet in 1/4 tandem bilaterally 2x10; 1/2 tandem head turns vertically and horizontally 2x20 bilaterally; toe taps onto 4" step alternating bilaterally 2x20; clock taps 2x2 on both legs      Knee/Hip Exercises: Stretches   Gastroc Stretch 2 reps;30  seconds;Both    Gastroc Stretch Limitations with strap sitting EOB                          PT Education - 03/10/21 1229     Education Details continue HEP, discussion on how her balance has improved    Person(s) Educated Patient    Methods Explanation;Demonstration;Tactile cues;Verbal cues    Comprehension Verbalized understanding;Returned demonstration;Need further instruction              PT Short Term Goals - 02/19/21 1128       PT SHORT TERM GOAL #1   Title Pt will be independent with initial HEP    Status Achieved      PT SHORT TERM GOAL #2   Title PT will go over FOTO results with pt.    Status Achieved    Target Date 01/28/21                PT Long Term Goals - 02/19/21 1114       PT LONG TERM GOAL #1   Title Pt will be independent with advanced HEP    Time 4    Period Weeks    Status On-going    Target Date 03/19/21      PT LONG TERM GOAL #2   Title Pt will achieve 34/56 on the Berg Balance test to show a detectable change with balance interventions and to decrease the pts risk for falls.    Baseline 33/56    Time 4    Period Weeks    Status On-going    Target Date 03/19/21      PT LONG TERM GOAL #3   Title Pt will improve bilateral dorsiflexion to 10 degrees in order to decrease pain in bilateral feet.    Baseline 0 degrees on R and 3 degrees on L of dorsiflexion    Time 4    Period Weeks    Status On-going    Target Date 03/19/21      PT LONG TERM GOAL #4   Title Pt will improve FOTO from 49% to predicted 60% in order to show functional improvement.    Baseline 64% on 02/04/21    Status Achieved                        Plan - 03/10/21 1240     Clinical Impression  Statement Pt tolerated PT session well. She had slightly SOB during sit to stands due to asthma per pt report and didn't have her inhaler with her, but was able to return to baseline with rest. SOB was monitored throughout rest of session. Continues to use  momentum for sit to stands but session focused on using more LE strength. Pt showed increased LOS with exercises and decreased losses of balance throughout session, so balance challenges focusing on somatosensory, vestibular, visual, anticipatory/voluntary control, and limits of stability increased this session. Continues to benefit from skilled PT in order to address above impairements and increase intrinsic foot strength in order to decrease foot pain and decrease fall risk.    PT Treatment/Interventions Electrical Stimulation;Iontophoresis 4mg /ml Dexamethasone;Moist Heat;Cryotherapy;Ultrasound;Gait training;Stair training;Functional mobility training;Therapeutic activities;Therapeutic exercise;Balance training;Neuromuscular re-education;Patient/family education;Manual techniques;Taping;Dry needling;Vasopneumatic Device    PT Next Visit Plan gastroc/soleus stretching, perform reactive/APA exercise balance tasks    PT Home Exercise Plan [email or web address]    [id number] and Agree with Plan of Care Patient             Patient will benefit from skilled therapeutic intervention in order to improve the following deficits and impairments:  Decreased balance,Decreased mobility,Difficulty walking,Decreased activity tolerance,Decreased strength,Abnormal gait,Decreased range of motion,Cardiopulmonary status limiting activity,Decreased endurance,Decreased safety awareness,Pain,Impaired flexibility,Increased edema,Increased fascial restricitons  Visit Diagnosis: Pain in left foot  Pain in right foot  Stiffness of right foot, not elsewhere classified  Stiffness of left foot, not elsewhere classified  Muscle weakness (generalized)  Other abnormalities of gait and mobility  Unsteadiness on feet      Problem List Patient Active Problem List   Diagnosis Date Noted   Tinea pedis 04/09/2020   Spinal stenosis of lumbar region without neurogenic claudication 02/08/2019   Nephropathy, diabetic ([place]) 02/07/2019    Gout 05/16/2018   Gait instability 01/03/2018   Frequent bowel movements 11/02/2017   Tremor of both hands    Stuttering    Essential hypertension    Hyperlipidemia 08/13/2015   Breast cancer of lower-outer quadrant of right female breast ([place]) 02/13/2015   Vitamin D deficiency 02/06/2015   Type 2 diabetes mellitus with diabetic foot deformity ([place]) 01/01/2014   Chronic combined systolic and diastolic heart failure ([place]) 02/26/2013   OBESITY 02/05/2009   Asthma, intermittent 02/08/2008   NEPHROLITHIASIS 01/26/2007   DJD (degenerative joint disease), multiple sites 01/26/2007    Caleb Popp, SPT 03/10/2021, 12:45 PM  [facility] [address] [place], [place], [address] Phone: [phone]   Fax:  [phone]  Name: [name] MRN: [id number] Date of Birth: 1951/04/26

## 2021-03-17 ENCOUNTER — Other Ambulatory Visit: Payer: Self-pay

## 2021-03-17 ENCOUNTER — Encounter: Payer: Self-pay | Admitting: Physical Therapy

## 2021-03-17 ENCOUNTER — Ambulatory Visit: Payer: Medicare Other | Admitting: Physical Therapy

## 2021-03-17 DIAGNOSIS — M79671 Pain in right foot: Secondary | ICD-10-CM | POA: Diagnosis not present

## 2021-03-17 DIAGNOSIS — M79672 Pain in left foot: Secondary | ICD-10-CM | POA: Diagnosis not present

## 2021-03-17 DIAGNOSIS — R2689 Other abnormalities of gait and mobility: Secondary | ICD-10-CM

## 2021-03-17 DIAGNOSIS — M6281 Muscle weakness (generalized): Secondary | ICD-10-CM | POA: Diagnosis not present

## 2021-03-17 DIAGNOSIS — M25675 Stiffness of left foot, not elsewhere classified: Secondary | ICD-10-CM

## 2021-03-17 DIAGNOSIS — M25674 Stiffness of right foot, not elsewhere classified: Secondary | ICD-10-CM | POA: Diagnosis not present

## 2021-03-17 DIAGNOSIS — R2681 Unsteadiness on feet: Secondary | ICD-10-CM

## 2021-03-17 NOTE — Patient Instructions (Signed)
Access Code: [email or web address] URL: [email or web address] Date: 03/17/2021 Prepared by: [place] with Counter Support - 1 x daily - 7 x weekly - 3 sets - 10-15 reps Standing Romberg to 3/4 Tandem Stance - 1 x daily - 7 x weekly - 3 reps - 30 seconds hold Sit to Stand with Armchair - 1 x daily - 7 x weekly - 3 sets - 10 reps Standing Gastroc Stretch at Counter - 2-3 x daily - 7 x weekly - 3 reps - 30 seconds hold Seated Plantar Fascia Mobilization with Small Ball - 1-2 x daily - 7 x weekly Isometric Ankle Inversion - 1 x daily - 7 x weekly - 2 sets - 10 reps Seated Ankle Dorsiflexion with Resistance - 1 x daily - 7 x weekly - 2 sets - 10 reps Seated Ankle Plantarflexion with Resistance - 1 x daily - 7 x weekly - 2 sets - 10 reps Seated Ankle Eversion with Resistance - 1 x daily - 7 x weekly - 2 sets - 10 reps Long Sitting Calf Stretch with Strap - 1-2 x daily - 7 x weekly - 3 reps - 30 seconds hold Standing [place] with Counter Support - 1 x daily - 7 x weekly - 3 sets - 10 reps Standing Hip Abduction with Counter Support - 1 x daily - 7 x weekly - 3 sets - 10 reps Standing Hip Extension with Counter Support - 1 x daily - 7 x weekly - 3 sets - 10 reps Single Leg Balance with Clock Reach - 1 x daily - 7 x weekly - 2 sets - 10 reps Standing Tandem Balance with Counter Support - 1 x daily - 7 x weekly - 3 sets - 30 hold

## 2021-03-17 NOTE — Therapy (Addendum)
[place], [place], [address] Phone: [phone]   Fax:  [phone]  Physical Therapy Treatment / ERO  Patient Details  Name: [name] MRN: [id number] Date of Birth: 06/12/1951 Referring Provider (PT): Evelina Bucy, DPM   Encounter Date: 03/17/2021   PT End of Session - 03/17/21 1009     Visit Number 12    Number of Visits 18    Date for PT Re-Evaluation 04/28/21    Authorization Type [facility] MCR/ MCD 2022    Authorization Time Period kx by 15    Progress Note Due on Visit 18    PT Start Time 1005    PT Stop Time 1045    PT Time Calculation (min) 40 min    Equipment Utilized During Treatment Gait belt    Activity Tolerance Patient tolerated treatment well    Behavior During Therapy [facility] for tasks assessed/performed             Past Medical History:  Diagnosis Date   Anemia    Arthritis    Asthma    Blood transfusion without reported diagnosis    Patients believes 2015 when she overdosed on "medications"    Breast cancer ([place])    Breast cancer of lower-outer quadrant of right female breast ([place]) 02/13/2015   Cataract    CHF, acute ([place]) 05/03/2012   Depression    Diabetes mellitus    Eczema    Fatty liver    Fatty infiltration of liver noted on 03/2012 CT scan   Fibromyalgia    Glaucoma    Heart disease    History of kidney stones    w/ hx of hydronephrosis - followed by [facility]   HIV nonspecific serology    2006: indeterminate HIV blood test, seen by ID, felt secondary to cross reacting antibodies with no further workup felt necessary at that time    Hypertension    Obesity    Personal history of radiation therapy 2016   right   Radiation 05/07/15-06/23/15   Right Breast    Past Surgical History:  Procedure Laterality Date   BREAST BIOPSY Left 02/10/2015   malignant    BREAST LUMPECTOMY Right 03/21/2015   CHOLECYSTECTOMY  2003   CYSTOSCOPY W/ LITHOLAPAXY / EHL      JOINT REPLACEMENT     bilateral knee replacement   LEFT AND RIGHT HEART CATHETERIZATION WITH CORONARY ANGIOGRAM N/A 05/02/2012   Procedure: LEFT AND RIGHT HEART CATHETERIZATION WITH CORONARY ANGIOGRAM;  Surgeon: Burnell Blanks, MD;  Location[facility] CATH LAB;  Service: Cardiovascular;  Laterality: N/A;   RADIOACTIVE SEED GUIDED PARTIAL MASTECTOMY WITH AXILLARY SENTINEL LYMPH NODE BIOPSY Right 03/21/2015   Procedure: RIGHT  PARTIAL MASTECTOMY WITH RADIOACTIVE SEED LOCALIZATION RIGHT  AXILLARY SENTINEL LYMPH NODE BIOPSY;  Surgeon: Fanny Skates, MD;  Location: [place];  Service: General;  Laterality: Right;   REPLACEMENT TOTAL KNEE BILATERAL  2005 &2006   VASCULAR SURGERY Right 03/15/2013   Ultrasound guided sclerotherapy    There were no vitals filed for this visit.   Subjective Assessment - 03/17/21 1009     Subjective No pain on R foot today, some pain in L foot today.    Patient Stated Goals stand up longer, be able to move and walk, scared to walk by herself.    Currently in Pain? Yes    Pain Score 3     Pain Location Foot    Pain Orientation  Left    Pain Descriptors / Indicators Sharp;Stabbing    Pain Type Chronic pain    Pain Onset More than a month ago    Pain Frequency Constant                                               [facility] Adult PT Treatment/Exercise - 03/17/21 0001       Dynamic Standing Balance   Dynamic Standing - Comments sit to stand from 2" geraitric plinth, slow and then fast stepping out to the side bilaterally and front diagnols bilaterally 2x10; tandem bilaterally counting from 100 by 3 down to 50 x1 each side; ankle strategy training-sway back and forth keeping feet on floor; cone reaches forward alternating to geriatric plinth 3x10; cone reaches laterally with cone on counter 2x10 and then progressed to lateral reaches with cone on deadlift box that was on geriatric plinth 2x10; toe taps onto 6" step 2x10; yellow weighted  ball head close to body for trunk rotation rotating L to R 2x10; 2# weight 2x10 L arm not holding onto counter tapping weight from counter to first shelf                          PT Education - 03/17/21 1706     Education Details HEP update, only do exercises as prescribed (don't try to do additional exercise that we do in here).    Person(s) Educated Patient    Methods Explanation;Demonstration;Handout    Comprehension Verbalized understanding;Returned demonstration;Need further instruction              PT Short Term Goals - 02/19/21 1128       PT SHORT TERM GOAL #1   Title Pt will be independent with initial HEP    Status Achieved      PT SHORT TERM GOAL #2   Title PT will go over FOTO results with pt.    Status Achieved    Target Date 01/28/21                PT Long Term Goals - 03/17/21 1805       PT LONG TERM GOAL #1   Title Pt will be independent with advanced HEP    Baseline HEP updated this visit    Time 6    Period Weeks    Status On-going    Target Date 04/28/21      PT LONG TERM GOAL #2   Title Pt will achieve 34/56 on the Berg Balance test to show a detectable change with balance interventions and to decrease the pts risk for falls.    Baseline 33/56 - not assessed this visit    Time 6    Period Weeks    Status On-going    Target Date 04/28/21      PT LONG TERM GOAL #3   Title Pt will improve bilateral dorsiflexion to 10 degrees in order to decrease pain in bilateral feet.    Baseline 0 degrees on R and 3 degrees on L of dorsiflexion - not assessed this visit    Time 6    Period Weeks    Status On-going    Target Date 04/28/21      PT LONG TERM GOAL #4   Title Pt will improve FOTO from 49% to predicted  60% in order to show functional improvement.    Baseline 64% on 02/04/21    Status Achieved                        Plan - 03/17/21 1713     Clinical Impression Statement Pt tolerated PT session well with no adverse  effects. Pt stated that she tried to do cone taps from counter to her first and second shelf (the exercise that we did in here) at home and broke her dishes and had to have her granddaughter come over to clean up, she did not have LOB though, just broke dishes. Pt instructed to only perform exercises at home that we prescribe her and not try to do exercises that we do here due to safety at home/living alone. Pt stated understanding. Balance work included challenging cognition, somatosensory, anticipatory/voluntary control, and limits of stability which showed increased tolerance this session. Continues to benefit from skilled PT in order to address above impairements and increase intrinsic foot strength in order to decrease foot pain and decrease fall risk.    PT Frequency 1x / week    PT Duration 6 weeks    PT Treatment/Interventions Electrical Stimulation;Iontophoresis 4mg /ml Dexamethasone;Moist Heat;Cryotherapy;Ultrasound;Gait training;Stair training;Functional mobility training;Therapeutic activities;Therapeutic exercise;Balance training;Neuromuscular re-education;Patient/family education;Manual techniques;Taping;Dry needling;Vasopneumatic Device    PT Next Visit Plan gastroc/soleus stretching, perform reactive/APA exercise balance tasks    PT Home Exercise Plan [email or web address]    Consulted and Agree with Plan of Care Patient             Patient will benefit from skilled therapeutic intervention in order to improve the following deficits and impairments:  Decreased balance,Decreased mobility,Difficulty walking,Decreased activity tolerance,Decreased strength,Abnormal gait,Decreased range of motion,Cardiopulmonary status limiting activity,Decreased endurance,Decreased safety awareness,Pain,Impaired flexibility,Increased edema,Increased fascial restricitons  Visit Diagnosis: Pain in left foot  Pain in right foot  Stiffness of right foot, not elsewhere classified  Stiffness of left foot, not  elsewhere classified  Muscle weakness (generalized)  Other abnormalities of gait and mobility  Unsteadiness on feet      Problem List Patient Active Problem List   Diagnosis Date Noted   Tinea pedis 04/09/2020   Spinal stenosis of lumbar region without neurogenic claudication 02/08/2019   Nephropathy, diabetic ([place]) 02/07/2019   Gout 05/16/2018   Gait instability 01/03/2018   Frequent bowel movements 11/02/2017   Tremor of both hands    Stuttering    Essential hypertension    Hyperlipidemia 08/13/2015   Breast cancer of lower-outer quadrant of right female breast ([place]) 02/13/2015   Vitamin D deficiency 02/06/2015   Type 2 diabetes mellitus with diabetic foot deformity ([place]) 01/01/2014   Chronic combined systolic and diastolic heart failure ([place]) 02/26/2013   OBESITY 02/05/2009   Asthma, intermittent 02/08/2008   NEPHROLITHIASIS 01/26/2007   DJD (degenerative joint disease), multiple sites 01/26/2007    Caleb Popp, SPT 03/17/2021, 6:08 PM  [facility] [address] [place], [place], [address] Phone: [phone]   Fax:  [phone]  Name: TESSY PAWELSKI MRN: [id number] Date of Birth: [birth year]

## 2021-03-25 ENCOUNTER — Ambulatory Visit: Payer: Medicare Other | Admitting: Physical Therapy

## 2021-03-25 ENCOUNTER — Encounter: Payer: Self-pay | Admitting: Physical Therapy

## 2021-03-25 ENCOUNTER — Other Ambulatory Visit: Payer: Self-pay

## 2021-03-25 DIAGNOSIS — M79672 Pain in left foot: Secondary | ICD-10-CM | POA: Diagnosis not present

## 2021-03-25 DIAGNOSIS — R2689 Other abnormalities of gait and mobility: Secondary | ICD-10-CM | POA: Diagnosis not present

## 2021-03-25 DIAGNOSIS — R2681 Unsteadiness on feet: Secondary | ICD-10-CM

## 2021-03-25 DIAGNOSIS — M79671 Pain in right foot: Secondary | ICD-10-CM | POA: Diagnosis not present

## 2021-03-25 DIAGNOSIS — M25674 Stiffness of right foot, not elsewhere classified: Secondary | ICD-10-CM

## 2021-03-25 DIAGNOSIS — M25675 Stiffness of left foot, not elsewhere classified: Secondary | ICD-10-CM | POA: Diagnosis not present

## 2021-03-25 DIAGNOSIS — M6281 Muscle weakness (generalized): Secondary | ICD-10-CM | POA: Diagnosis not present

## 2021-03-25 NOTE — Therapy (Addendum)
[place], [place], [address] Phone: [phone]   Fax:  [phone]  Physical Therapy Treatment  Patient Details  Name: [name] MRN: [id number] Date of Birth: 18-May-1951 Referring Provider (PT): Evelina Bucy, DPM   Encounter Date: 03/25/2021   PT End of Session - 03/25/21 0922     Visit Number 13    Number of Visits 18    Date for PT Re-Evaluation 04/28/21    Authorization Type [facility] MCR/ MCD 2022    Authorization Time Period kx by 15    Progress Note Due on Visit 18    PT Start Time 0920    PT Stop Time 0959    PT Time Calculation (min) 39 min    Equipment Utilized During Treatment Gait belt    Activity Tolerance Patient tolerated treatment well    Behavior During Therapy [facility] for tasks assessed/performed             Past Medical History:  Diagnosis Date   Anemia    Arthritis    Asthma    Blood transfusion without reported diagnosis    Patients believes 2015 when she overdosed on "medications"    Breast cancer ([place])    Breast cancer of lower-outer quadrant of right female breast ([place]) 02/13/2015   Cataract    CHF, acute ([place]) 05/03/2012   Depression    Diabetes mellitus    Eczema    Fatty liver    Fatty infiltration of liver noted on 03/2012 CT scan   Fibromyalgia    Glaucoma    Heart disease    History of kidney stones    w/ hx of hydronephrosis - followed by [facility]   HIV nonspecific serology    2006: indeterminate HIV blood test, seen by ID, felt secondary to cross reacting antibodies with no further workup felt necessary at that time    Hypertension    Obesity    Personal history of radiation therapy 2016   right   Radiation 05/07/15-06/23/15   Right Breast    Past Surgical History:  Procedure Laterality Date   BREAST BIOPSY Left 02/10/2015   malignant    BREAST LUMPECTOMY Right 03/21/2015   CHOLECYSTECTOMY  2003   CYSTOSCOPY W/ LITHOLAPAXY / EHL     JOINT  REPLACEMENT     bilateral knee replacement   LEFT AND RIGHT HEART CATHETERIZATION WITH CORONARY ANGIOGRAM N/A 05/02/2012   Procedure: LEFT AND RIGHT HEART CATHETERIZATION WITH CORONARY ANGIOGRAM;  Surgeon: Burnell Blanks, MD;  Location: [facility] CATH LAB;  Service: Cardiovascular;  Laterality: N/A;   RADIOACTIVE SEED GUIDED PARTIAL MASTECTOMY WITH AXILLARY SENTINEL LYMPH NODE BIOPSY Right 03/21/2015   Procedure: RIGHT  PARTIAL MASTECTOMY WITH RADIOACTIVE SEED LOCALIZATION RIGHT  AXILLARY SENTINEL LYMPH NODE BIOPSY;  Surgeon: Fanny Skates, MD;  Location: [place];  Service: General;  Laterality: Right;   REPLACEMENT TOTAL KNEE BILATERAL  2005 &2006   VASCULAR SURGERY Right 03/15/2013   Ultrasound guided sclerotherapy    There were no vitals filed for this visit.    Subjective Assessment - 03/25/21 0923     Subjective I have been doing the exercises, I only have some pain in the feet.    Currently in Pain? Yes    Pain Score 4     Pain Location Foot    Pain Orientation Right;Left    Pain Descriptors / Indicators Dull;Aching    Pain Type Chronic pain  Pain Onset More than a month ago    Pain Frequency Constant                                       Objective measurements completed on examination: See above findings.          [facility] Adult PT Treatment/Exercise - 03/25/21 0001       Dynamic Standing Balance   Dynamic Standing - Comments sit to stand from 2" on geriatric plinth x10 and then sit to stand with no step from geriatric plinth x10; slow volitional stepping to the side, backwards, and forward diagonal with 2# weight x10 each way bilaterally and then without weight and fast volitional stepping each way x10; ankle strategy swaying forward and backwards x20; toe taps onto 4" step with 2# ankle weights bilaterally 2x12; tandem 3x30" bilaterally with dual task counting down by 3's from 100; cone taps alternating reachs forward 2x12 from geriatric  plinth; cone taps lateral reaches bilaterally from 8" step on geriatric plinth 2x12; trunk rotation L to R with blue weighted ball close to body 2x15;                          PT Education - 03/25/21 1123     Education Details continue with HEP    Person(s) Educated Patient    Methods Explanation;Demonstration;Handout    Comprehension Verbalized understanding;Returned demonstration;Need further instruction              PT Short Term Goals - 02/19/21 1128       PT SHORT TERM GOAL #1   Title Pt will be independent with initial HEP    Status Achieved      PT SHORT TERM GOAL #2   Title PT will go over FOTO results with pt.    Status Achieved    Target Date 01/28/21                PT Long Term Goals - 03/17/21 1805       PT LONG TERM GOAL #1   Title Pt will be independent with advanced HEP    Baseline HEP updated this visit    Time 6    Period Weeks    Status On-going    Target Date 04/28/21      PT LONG TERM GOAL #2   Title Pt will achieve 34/56 on the Berg Balance test to show a detectable change with balance interventions and to decrease the pts risk for falls.    Baseline 33/56 - not assessed this visit    Time 6    Period Weeks    Status On-going    Target Date 04/28/21      PT LONG TERM GOAL #3   Title Pt will improve bilateral dorsiflexion to 10 degrees in order to decrease pain in bilateral feet.    Baseline 0 degrees on R and 3 degrees on L of dorsiflexion - not assessed this visit    Time 6    Period Weeks    Status On-going    Target Date 04/28/21      PT LONG TERM GOAL #4   Title Pt will improve FOTO from 49% to predicted 60% in order to show functional improvement.    Baseline 64% on 02/04/21    Status Achieved  Plan - 03/25/21 1123     Clinical Impression Statement Pt tolerated PT session well with no adverse effects. Further challenges to biomechanical, cognitive, limits of stability, and  anticipatory/voluntary control worked on this session. Foundational reactive balance exercises began this session with voltionional slow stepping that worked up to volitional fast stepping. Pts continues to progress with balance challenges and shows less unsteadiness throughout session. Continues to benefit from skilled PT in order ot address the above impairements in order to decrease bilateral foot pain and decrease fall risk.    PT Treatment/Interventions Electrical Stimulation;Iontophoresis 4mg /ml Dexamethasone;Moist Heat;Cryotherapy;Ultrasound;Gait training;Stair training;Functional mobility training;Therapeutic activities;Therapeutic exercise;Balance training;Neuromuscular re-education;Patient/family education;Manual techniques;Taping;Dry needling;Vasopneumatic Device    PT Next Visit Plan gastroc/soleus stretching, perform reactive/APA/LOS exercise balance tasks    PT Home Exercise Plan [email or web address]    Consulted and Agree with Plan of Care Patient             Patient will benefit from skilled therapeutic intervention in order to improve the following deficits and impairments:  Decreased balance,Decreased mobility,Difficulty walking,Decreased activity tolerance,Decreased strength,Abnormal gait,Decreased range of motion,Cardiopulmonary status limiting activity,Decreased endurance,Decreased safety awareness,Pain,Impaired flexibility,Increased edema,Increased fascial restricitons  Visit Diagnosis: Pain in left foot  Pain in right foot  Stiffness of right foot, not elsewhere classified  Stiffness of left foot, not elsewhere classified  Muscle weakness (generalized)  Other abnormalities of gait and mobility  Unsteadiness on feet      Problem List Patient Active Problem List   Diagnosis Date Noted   Tinea pedis 04/09/2020   Spinal stenosis of lumbar region without neurogenic claudication 02/08/2019   Nephropathy, diabetic ([place]) 02/07/2019   Gout 05/16/2018   Gait instability  01/03/2018   Frequent bowel movements 11/02/2017   Tremor of both hands    Stuttering    Essential hypertension    Hyperlipidemia 08/13/2015   Breast cancer of lower-outer quadrant of right female breast ([place]) 02/13/2015   Vitamin D deficiency 02/06/2015   Type 2 diabetes mellitus with diabetic foot deformity ([place]) 01/01/2014   Chronic combined systolic and diastolic heart failure ([place]) 02/26/2013   OBESITY 02/05/2009   Asthma, intermittent 02/08/2008   NEPHROLITHIASIS 01/26/2007   DJD (degenerative joint disease), multiple sites 01/26/2007    Caleb Popp, SPT 03/25/2021, 11:37 AM  [facility] [address] [place], [place], [address] Phone: [phone]   Fax:  [phone]  Name: QUINCEE GITTENS MRN: [id number] Date of Birth: 1951/10/06

## 2021-04-01 ENCOUNTER — Encounter: Payer: Self-pay | Admitting: Physical Therapy

## 2021-04-01 ENCOUNTER — Ambulatory Visit: Payer: Medicare Other | Attending: Podiatry | Admitting: Physical Therapy

## 2021-04-01 ENCOUNTER — Other Ambulatory Visit: Payer: Self-pay

## 2021-04-01 DIAGNOSIS — R2681 Unsteadiness on feet: Secondary | ICD-10-CM | POA: Diagnosis not present

## 2021-04-01 DIAGNOSIS — R2689 Other abnormalities of gait and mobility: Secondary | ICD-10-CM | POA: Diagnosis not present

## 2021-04-01 DIAGNOSIS — M25674 Stiffness of right foot, not elsewhere classified: Secondary | ICD-10-CM

## 2021-04-01 DIAGNOSIS — M25675 Stiffness of left foot, not elsewhere classified: Secondary | ICD-10-CM | POA: Diagnosis not present

## 2021-04-01 DIAGNOSIS — M6281 Muscle weakness (generalized): Secondary | ICD-10-CM

## 2021-04-01 DIAGNOSIS — M79672 Pain in left foot: Secondary | ICD-10-CM

## 2021-04-01 DIAGNOSIS — M79671 Pain in right foot: Secondary | ICD-10-CM

## 2021-04-01 NOTE — Patient Instructions (Signed)
Access Code: [email or web address] URL: [email or web address] Date: 04/01/2021 Prepared by: Hilda Blades  Exercises Long Sitting Calf Stretch with Strap - 1-2 x daily - 7 x weekly - 3 reps - 30 seconds hold Standing Gastroc Stretch at [employer] - 2-3 x daily - 7 x weekly - 3 reps - 30 seconds hold Seated Plantar Fascia Mobilization with Small Ball - 1-2 x daily - 7 x weekly Isometric Ankle Inversion - 1 x daily - 7 x weekly - 2 sets - 10 reps Seated Ankle Dorsiflexion with Resistance - 1 x daily - 7 x weekly - 2 sets - 10 reps Seated Ankle Plantarflexion with Resistance - 1 x daily - 7 x weekly - 2 sets - 10 reps Seated Ankle Eversion with Resistance - 1 x daily - 7 x weekly - 2 sets - 10 reps Sit to Stand with Armchair - 1 x daily - 7 x weekly - 3 sets - 10 reps Heel Toe Raises with Counter Support - 1 x daily - 7 x weekly - 3 sets - 10-15 reps Standing March with Counter Support - 1 x daily - 7 x weekly - 3 sets - 10 reps Standing Hip Abduction with Counter Support - 1 x daily - 7 x weekly - 3 sets - 10 reps Standing Hip Extension with Counter Support - 1 x daily - 7 x weekly - 3 sets - 10 reps Single Leg Balance with Clock Reach - 1 x daily - 7 x weekly - 2 sets - 10 reps Standing Tandem Balance with Counter Support - 1 x daily - 7 x weekly - 3 sets - 30 hold

## 2021-04-01 NOTE — Therapy (Signed)
[place], [place], [address] Phone: [phone]   Fax:  [phone]  Physical Therapy Treatment  Patient Details  Name: [name] MRN: [id number] Date of Birth: 1951/04/23 Referring Provider (PT): Evelina Bucy, DPM   Encounter Date: 04/01/2021   PT End of Session - 04/01/21 0913    Visit Number 14    Number of Visits 18    Date for PT Re-Evaluation 04/28/21    Authorization Type [facility] MCR/ MCD 2022    Authorization Time Period kx by 15    Progress Note Due on Visit 18    PT Start Time 0910    PT Stop Time 0955    PT Time Calculation (min) 45 min    Equipment Utilized During Treatment Gait belt    Activity Tolerance Patient tolerated treatment well    Behavior During Therapy [facility] for tasks assessed/performed           Past Medical History:  Diagnosis Date  . Anemia   . Arthritis   . Asthma   . Blood transfusion without reported diagnosis    Patients believes 2015 when she overdosed on "medications"   . Breast cancer ([place])   . Breast cancer of lower-outer quadrant of right female breast ([place]) 02/13/2015  . Cataract   . CHF, acute ([place]) 05/03/2012  . Depression   . Diabetes mellitus   . Eczema   . Fatty liver    Fatty infiltration of liver noted on 03/2012 CT scan  . Fibromyalgia   . Glaucoma   . Heart disease   . History of kidney stones    w/ hx of hydronephrosis - followed by [facility]  . HIV nonspecific serology    2006: indeterminate HIV blood test, seen by ID, felt secondary to cross reacting antibodies with no further workup felt necessary at that time   . Hypertension   . Obesity   . Personal history of radiation therapy 2016   right  . Radiation 05/07/15-06/23/15   Right Breast    Past Surgical History:  Procedure Laterality Date  . BREAST BIOPSY Left 02/10/2015   malignant   . BREAST LUMPECTOMY Right 03/21/2015  . CHOLECYSTECTOMY  2003  . CYSTOSCOPY W/  LITHOLAPAXY / EHL    . JOINT REPLACEMENT     bilateral knee replacement  . LEFT AND RIGHT HEART CATHETERIZATION WITH CORONARY ANGIOGRAM N/A 05/02/2012   Procedure: LEFT AND RIGHT HEART CATHETERIZATION WITH CORONARY ANGIOGRAM;  Surgeon: Burnell Blanks, MD;  Location: [facility] CATH LAB;  Service: Cardiovascular;  Laterality: N/A;  . RADIOACTIVE SEED GUIDED PARTIAL MASTECTOMY WITH AXILLARY SENTINEL LYMPH NODE BIOPSY Right 03/21/2015   Procedure: RIGHT  PARTIAL MASTECTOMY WITH RADIOACTIVE SEED LOCALIZATION RIGHT  AXILLARY SENTINEL LYMPH NODE BIOPSY;  Surgeon: Fanny Skates, MD;  Location: [place];  Service: General;  Laterality: Right;  . REPLACEMENT TOTAL KNEE BILATERAL  2005 &2006  . VASCULAR SURGERY Right 03/15/2013   Ultrasound guided sclerotherapy    There were no vitals filed for this visit.   Subjective Assessment - 04/01/21 0912    Subjective Patient reports she is doing pretty good this morning, no new issues. Minimal pain in heels today and she attributes this to weather.    Patient Stated Goals stand up longer, be able to move and walk, scared to walk by herself.    Currently in Pain? Yes    Pain Score 3     Pain Location Foot  heels   Pain Orientation Right;Left    Pain Descriptors / Indicators Dull    Pain Type Chronic pain    Pain Onset More than a month ago    Pain Frequency Constant              [facility] PT Assessment - 04/01/21 0001      Strength   Strength Assessment Site Hip    Right/Left Hip Right;Left    Right Hip Flexion 4-/5    Right Hip Extension 3-/5    Right Hip ABduction 3/5    Left Hip Flexion 4/5    Left Hip Extension 3+/5    Left Hip ABduction 3+/5                         [facility] Adult PT Treatment/Exercise - 04/01/21 0001      Neuro Re-ed    Neuro Re-ed Details  Volitional stepping laterally and posterior each 2 x 10 each; Alternating toe taps on 6" box 3 x 20; romberg stance on Aire[employer] 3 x 20 sec; standing rhomberg  with forward/lateral reach 3 x 20 (3 cones placed in arc on counter ~2 ft apart, different colors and patient reached to specific color based on PT call)      Knee/Hip Exercises: Stretches   Gastroc Stretch 2 reps;30 seconds      Knee/Hip Exercises: Standing   Heel Raises 2 sets;20 reps    Heel Raises Limitations heel-toe with 2#    Knee Flexion 2 sets;10 reps    Knee Flexion Limitations 2#    Hip Flexion 2 sets;20 reps    Hip Flexion Limitations 2#    Hip Abduction 2 sets;10 reps    Abduction Limitations 2#    Hip Extension 2 sets;10 reps    Extension Limitations 2#      Knee/Hip Exercises: Seated   Long Arc Quad 2 sets;20 reps    Long Arc Quad Weight 2 lbs.    Sit to Sand 15 reps;without UE support   performed throughout session                 PT Education - 04/01/21 0913    Education Details HEP    Person(s) Educated Patient    Methods Explanation    Comprehension Verbalized understanding;Need further instruction            PT Short Term Goals - 02/19/21 1128      PT SHORT TERM GOAL #1   Title Pt will be independent with initial HEP    Status Achieved      PT SHORT TERM GOAL #2   Title PT will go over FOTO results with pt.    Status Achieved    Target Date 01/28/21             PT Long Term Goals - 04/01/21 1004      PT LONG TERM GOAL #1   Title Pt will be independent with advanced HEP    Baseline HEP updated this visit    Time 6    Period Weeks    Status On-going    Target Date 04/28/21      PT LONG TERM GOAL #2   Title Pt will achieve 34/56 on the Berg Balance test to show a detectable change with balance interventions and to decrease the pts risk for falls.    Baseline 33/56 - not assessed this visit    Time 6  Period Weeks    Status On-going    Target Date 04/28/21      PT LONG TERM GOAL #3   Title Pt will improve bilateral dorsiflexion to 10 degrees in order to decrease pain in bilateral feet.    Baseline 0 degrees on R and 3  degrees on L of dorsiflexion - not assessed this visit    Time 6    Period Weeks    Status On-going      PT LONG TERM GOAL #4   Title Pt will improve FOTO from 49% to predicted 60% in order to show functional improvement.    Baseline 64% on 02/04/21    Status Achieved                 Plan - 04/01/21 0914    Clinical Impression Statement Patient tolerated therapy well with no adverse effects. She continues to report occasional heel pain but main deficit is balance at this point. Therapy focused on progressing general LE strength and continued balance training with good tolerance. She exhibits greater weakness and control deficit on RLE wth standing tasks. She has improved with balance but continues to exhibit significant sway with stepping tasks, occasionally with LOB mainly in posterior direction. Balance training focused on practice of volitional stepping to improve correcting LOB, reaching outside BOS, and stationary balance on unsteady surface. Patient would benefit from continued skilled PT to progress strength and balance challenges in order to reduce fall risk and maximize functional ability.    PT Treatment/Interventions Electrical Stimulation;Iontophoresis 4mg /ml Dexamethasone;Moist Heat;Cryotherapy;Ultrasound;Gait training;Stair training;Functional mobility training;Therapeutic activities;Therapeutic exercise;Balance training;Neuromuscular re-education;Patient/family education;Manual techniques;Taping;Dry needling;Vasopneumatic Device    PT Next Visit Plan Continue focus on general LE strength and balance training    PT Home Exercise Plan [email or web address]    Consulted and Agree with Plan of Care Patient           Patient will benefit from skilled therapeutic intervention in order to improve the following deficits and impairments:  Decreased balance,Decreased mobility,Difficulty walking,Decreased activity tolerance,Decreased strength,Abnormal gait,Decreased range of motion,Cardiopulmonary  status limiting activity,Decreased endurance,Decreased safety awareness,Pain,Impaired flexibility,Increased edema,Increased fascial restricitons  Visit Diagnosis: Unsteadiness on feet  Other abnormalities of gait and mobility  Muscle weakness (generalized)  Pain in left foot  Pain in right foot  Stiffness of right foot, not elsewhere classified  Stiffness of left foot, not elsewhere classified     Problem List Patient Active Problem List   Diagnosis Date Noted  . Tinea pedis 04/09/2020  . Spinal stenosis of lumbar region without neurogenic claudication 02/08/2019  . Nephropathy, diabetic ([place]) 02/07/2019  . Gout 05/16/2018  . Gait instability 01/03/2018  . Frequent bowel movements 11/02/2017  . Tremor of both hands   . Stuttering   . Essential hypertension   . Hyperlipidemia 08/13/2015  . Breast cancer of lower-outer quadrant of right female breast ([place]) 02/13/2015  . Vitamin D deficiency 02/06/2015  . Type 2 diabetes mellitus with diabetic foot deformity ([place]) 01/01/2014  . Chronic combined systolic and diastolic heart failure ([place]) 02/26/2013  . OBESITY 02/05/2009  . Asthma, intermittent 02/08/2008  . NEPHROLITHIASIS 01/26/2007  . DJD (degenerative joint disease), multiple sites 01/26/2007    Hilda Blades, PT, DPT, LAT, ATC 04/01/21  10:12 AM Phone: [phone] Fax: [facility] [address] [place], [place], [address] Phone: [phone]   Fax:  [phone]  Name: [name] MRN: [id number] Date of Birth: 03-09-1951

## 2021-04-02 ENCOUNTER — Other Ambulatory Visit: Payer: Self-pay | Admitting: *Deleted

## 2021-04-02 NOTE — Patient Outreach (Signed)
[facility]) Care Management  05/29/1656  [name] [birth year] [id number]   [place] received  telephone call from patient.  [employer] compliance verified. Patient call due to exposure to COVID. Patient had taken test and it was negative. The exposure was the day before. RN explained that it takes a few days for the person to show positive after exposure. Patient will retest in 5 days unless become symptomatic.  [facility] Care Management [phone]

## 2021-04-03 ENCOUNTER — Other Ambulatory Visit: Payer: Self-pay

## 2021-04-03 MED ORDER — ONETOUCH DELICA PLUS LANCING MISC: 0 refills | Status: DC

## 2021-04-03 NOTE — Telephone Encounter (Signed)
Patient calls nurse line regarding lancets. Patient reports that she has not received from [facility]. Contacted pharmacy. They have not sent out refill as it had not been requested by patient. Canceled refill at [facility]. Resent to [facility] per patient request.   Talbot Grumbling, RN

## 2021-04-06 ENCOUNTER — Other Ambulatory Visit: Payer: Self-pay | Admitting: Family Medicine

## 2021-04-06 ENCOUNTER — Other Ambulatory Visit: Payer: Self-pay

## 2021-04-06 DIAGNOSIS — E1169 Type 2 diabetes mellitus with other specified complication: Secondary | ICD-10-CM

## 2021-04-06 MED ORDER — ONETOUCH DELICA LANCETS 30G MISC: 1.0000 | Freq: Every day | 99 refills | Status: DC | PRN

## 2021-04-08 ENCOUNTER — Other Ambulatory Visit: Payer: Self-pay

## 2021-04-08 ENCOUNTER — Ambulatory Visit: Payer: Medicare Other | Admitting: Physical Therapy

## 2021-04-08 ENCOUNTER — Encounter: Payer: Self-pay | Admitting: Physical Therapy

## 2021-04-08 DIAGNOSIS — M25674 Stiffness of right foot, not elsewhere classified: Secondary | ICD-10-CM | POA: Diagnosis not present

## 2021-04-08 DIAGNOSIS — R2681 Unsteadiness on feet: Secondary | ICD-10-CM

## 2021-04-08 DIAGNOSIS — M79672 Pain in left foot: Secondary | ICD-10-CM

## 2021-04-08 DIAGNOSIS — R2689 Other abnormalities of gait and mobility: Secondary | ICD-10-CM

## 2021-04-08 DIAGNOSIS — M25675 Stiffness of left foot, not elsewhere classified: Secondary | ICD-10-CM | POA: Diagnosis not present

## 2021-04-08 DIAGNOSIS — M6281 Muscle weakness (generalized): Secondary | ICD-10-CM | POA: Diagnosis not present

## 2021-04-08 DIAGNOSIS — M79671 Pain in right foot: Secondary | ICD-10-CM

## 2021-04-08 NOTE — Therapy (Signed)
[place], [place], [address] Phone: [phone]   Fax:  [phone]  Physical Therapy Treatment  Patient Details  Name: [name] MRN: [id number] Date of Birth: [birth year] Referring Provider (PT): Evelina Bucy, DPM   Encounter Date: 04/08/2021   PT End of Session - 04/08/21 1051    Visit Number 15    Number of Visits 18    Date for PT Re-Evaluation 04/28/21    Authorization Type [employer] MCR/ MCD 2022    Authorization Time Period kx by 15    Progress Note Due on Visit 18    PT Start Time 1046    PT Stop Time 1130    PT Time Calculation (min) 44 min    Equipment Utilized During Treatment Gait belt    Activity Tolerance Patient tolerated treatment well    Behavior During Therapy [facility] for tasks assessed/performed           Past Medical History:  Diagnosis Date  . Anemia   . Arthritis   . Asthma   . Blood transfusion without reported diagnosis    Patients believes 2015 when she overdosed on "medications"   . Breast cancer ([place])   . Breast cancer of lower-outer quadrant of right female breast ([place]) 02/13/2015  . Cataract   . CHF, acute ([place]) 05/03/2012  . Depression   . Diabetes mellitus   . Eczema   . Fatty liver    Fatty infiltration of liver noted on 03/2012 CT scan  . Fibromyalgia   . Glaucoma   . Heart disease   . History of kidney stones    w/ hx of hydronephrosis - followed by [facility]  . HIV nonspecific serology    2006: indeterminate HIV blood test, seen by ID, felt secondary to cross reacting antibodies with no further workup felt necessary at that time   . Hypertension   . Obesity   . Personal history of radiation therapy 2016   right  . Radiation 05/07/15-06/23/15   Right Breast    Past Surgical History:  Procedure Laterality Date  . BREAST BIOPSY Left 02/10/2015   malignant   . BREAST LUMPECTOMY Right 03/21/2015  . CHOLECYSTECTOMY  2003  . CYSTOSCOPY W/  LITHOLAPAXY / EHL    . JOINT REPLACEMENT     bilateral knee replacement  . LEFT AND RIGHT HEART CATHETERIZATION WITH CORONARY ANGIOGRAM N/A 05/02/2012   Procedure: LEFT AND RIGHT HEART CATHETERIZATION WITH CORONARY ANGIOGRAM;  Surgeon: Burnell Blanks, MD;  Location: [facility] CATH LAB;  Service: Cardiovascular;  Laterality: N/A;  . RADIOACTIVE SEED GUIDED PARTIAL MASTECTOMY WITH AXILLARY SENTINEL LYMPH NODE BIOPSY Right 03/21/2015   Procedure: RIGHT  PARTIAL MASTECTOMY WITH RADIOACTIVE SEED LOCALIZATION RIGHT  AXILLARY SENTINEL LYMPH NODE BIOPSY;  Surgeon: Fanny Skates, MD;  Location: [place];  Service: General;  Laterality: Right;  . REPLACEMENT TOTAL KNEE BILATERAL  2005 &2006  . VASCULAR SURGERY Right 03/15/2013   Ultrasound guided sclerotherapy    There were no vitals filed for this visit.   Subjective Assessment - 04/08/21 1052    Subjective Pt reports that she is doing well this morning.  She reports that she has a little bit of pain in her heels, but this is minimal.  She feels PT is improving her balance and strength.    Patient Stated Goals stand up longer, be able to move and walk, scared to walk by herself.    Currently in Pain?  Yes    Pain Score 3     Pain Location Foot    Pain Orientation Right;Left    Pain Descriptors / Indicators Dull    Pain Onset More than a month ago                             [facility] Adult PT Treatment/Exercise - 04/08/21 0001      Neuro Re-ed    Neuro Re-ed Details  Volitional stepping laterally and anterior each 2 x 10 with eyes closed; alternating toe taps to 8'' cones 3x8 ea ; romberg stance on Airex 3 x 20 sec;  Airex semi-tandem 2x30'' , standing rhomberg with forward/lateral reach 2 x 20 (3 cones placed in arc on counter ~2 ft apart, different colors and patient reached to specific color based on PT call)      Knee/Hip Exercises: Aerobic   Nustep 5 min, level 5      Knee/Hip Exercises: Standing   Heel  Raises Limitations 3x10 4#, heel toe    Knee Flexion Limitations --    Hip Flexion Limitations 3x10 4#      Knee/Hip Exercises: Seated   Long Arc Quad 3 sets;10 reps    Long Arc Quad Weight 4 lbs.    Sit to Sand without UE support;10 reps;2 sets                    PT Short Term Goals - 02/19/21 1128      PT SHORT TERM GOAL #1   Title Pt will be independent with initial HEP    Status Achieved      PT SHORT TERM GOAL #2   Title PT will go over FOTO results with pt.    Status Achieved    Target Date 01/28/21             PT Long Term Goals - 04/01/21 1004      PT LONG TERM GOAL #1   Title Pt will be independent with advanced HEP    Baseline HEP updated this visit    Time 6    Period Weeks    Status On-going    Target Date 04/28/21      PT LONG TERM GOAL #2   Title Pt will achieve 34/56 on the Berg Balance test to show a detectable change with balance interventions and to decrease the pts risk for falls.    Baseline 33/56 - not assessed this visit    Time 6    Period Weeks    Status On-going    Target Date 04/28/21      PT LONG TERM GOAL #3   Title Pt will improve bilateral dorsiflexion to 10 degrees in order to decrease pain in bilateral feet.    Baseline 0 degrees on R and 3 degrees on L of dorsiflexion - not assessed this visit    Time 6    Period Weeks    Status On-going      PT LONG TERM GOAL #4   Title Pt will improve FOTO from 49% to predicted 60% in order to show functional improvement.    Baseline 64% on 02/04/21    Status Achieved                 Plan - 04/08/21 1121    Clinical Impression Statement Pt tolerated sesssion well.  Seems to be progressing as expected.  She is having  minimal foot pain today and this is not limiting in her activity.  She shows instability in single leg stance activities such as cone tapping likely d/t hip and core weakness.  We focused on balance today and were able to progress to eyes closed volitional  stepping.  Pt was slighly nervoous with this but was able to complete with good form with CGA.    PT Treatment/Interventions Electrical Stimulation;Iontophoresis 4mg /ml Dexamethasone;Moist Heat;Cryotherapy;Ultrasound;Gait training;Stair training;Functional mobility training;Therapeutic activities;Therapeutic exercise;Balance training;Neuromuscular re-education;Patient/family education;Manual techniques;Taping;Dry needling;Vasopneumatic Device    PT Next Visit Plan Continue focus on general LE strength and balance training    PT Home Exercise Plan [id number]    Consulted and Agree with Plan of Care Patient           Patient will benefit from skilled therapeutic intervention in order to improve the following deficits and impairments:  Decreased balance,Decreased mobility,Difficulty walking,Decreased activity tolerance,Decreased strength,Abnormal gait,Decreased range of motion,Cardiopulmonary status limiting activity,Decreased endurance,Decreased safety awareness,Pain,Impaired flexibility,Increased edema,Increased fascial restricitons  Visit Diagnosis: Unsteadiness on feet  Other abnormalities of gait and mobility  Muscle weakness (generalized)  Pain in left foot  Pain in right foot     Problem List Patient Active Problem List   Diagnosis Date Noted  . Tinea pedis 04/09/2020  . Spinal stenosis of lumbar region without neurogenic claudication 02/08/2019  . Nephropathy, diabetic ([place]) 02/07/2019  . Gout 05/16/2018  . Gait instability 01/03/2018  . Frequent bowel movements 11/02/2017  . Tremor of both hands   . Stuttering   . Essential hypertension   . Hyperlipidemia 08/13/2015  . Breast cancer of lower-outer quadrant of right female breast ([place]) 02/13/2015  . Vitamin D deficiency 02/06/2015  . Type 2 diabetes mellitus with diabetic foot deformity ([place]) 01/01/2014  . Chronic combined systolic and diastolic heart failure ([place]) 02/26/2013  . OBESITY 02/05/2009  . Asthma,  intermittent 02/08/2008  . NEPHROLITHIASIS 01/26/2007  . DJD (degenerative joint disease), multiple sites 01/26/2007   Shearon Balo PT, DPT 04/08/21 12:05 PM  [facility] [address] [place], [place], [address] Phone: [phone]   Fax:  [phone]  Name: ALBERTHA BEATTIE MRN: [id number] Date of Birth: 26-Mar-1951

## 2021-04-14 ENCOUNTER — Other Ambulatory Visit: Payer: Self-pay | Admitting: Family Medicine

## 2021-04-14 DIAGNOSIS — I10 Essential (primary) hypertension: Secondary | ICD-10-CM

## 2021-04-15 ENCOUNTER — Encounter: Payer: Medicare Other | Admitting: Physical Therapy

## 2021-04-16 ENCOUNTER — Ambulatory Visit: Payer: Medicare Other | Admitting: Physical Therapy

## 2021-04-16 ENCOUNTER — Encounter: Payer: Self-pay | Admitting: Physical Therapy

## 2021-04-16 ENCOUNTER — Other Ambulatory Visit: Payer: Self-pay

## 2021-04-16 DIAGNOSIS — M79672 Pain in left foot: Secondary | ICD-10-CM

## 2021-04-16 DIAGNOSIS — M79671 Pain in right foot: Secondary | ICD-10-CM | POA: Diagnosis not present

## 2021-04-16 DIAGNOSIS — R2681 Unsteadiness on feet: Secondary | ICD-10-CM

## 2021-04-16 DIAGNOSIS — R2689 Other abnormalities of gait and mobility: Secondary | ICD-10-CM | POA: Diagnosis not present

## 2021-04-16 DIAGNOSIS — M25675 Stiffness of left foot, not elsewhere classified: Secondary | ICD-10-CM | POA: Diagnosis not present

## 2021-04-16 DIAGNOSIS — M25674 Stiffness of right foot, not elsewhere classified: Secondary | ICD-10-CM

## 2021-04-16 DIAGNOSIS — M6281 Muscle weakness (generalized): Secondary | ICD-10-CM

## 2021-04-16 NOTE — Therapy (Signed)
[place], [place], [address] Phone: [phone]   Fax:  [phone]  Physical Therapy Treatment  Patient Details  Name: [name] MRN: [id number] Date of Birth: 1951/10/19 Referring Provider (PT): Evelina Bucy, DPM   Encounter Date: 04/16/2021   PT End of Session - 04/16/21 0904    Visit Number 16    Number of Visits 18    Date for PT Re-Evaluation 04/28/21    Authorization Type [employer] MCR/ MCD 2022    Authorization Time Period Apply KX modifier    Progress Note Due on Visit 18    PT Start Time 0900    PT Stop Time 0945    PT Time Calculation (min) 45 min    Equipment Utilized During Treatment Gait belt    Activity Tolerance Patient tolerated treatment well    Behavior During Therapy [facility] for tasks assessed/performed           Past Medical History:  Diagnosis Date  . Anemia   . Arthritis   . Asthma   . Blood transfusion without reported diagnosis    Patients believes 2015 when she overdosed on "medications"   . Breast cancer ([place])   . Breast cancer of lower-outer quadrant of right female breast ([place]) 02/13/2015  . Cataract   . CHF, acute ([place]) 05/03/2012  . Depression   . Diabetes mellitus   . Eczema   . Fatty liver    Fatty infiltration of liver noted on 03/2012 CT scan  . Fibromyalgia   . Glaucoma   . Heart disease   . History of kidney stones    w/ hx of hydronephrosis - followed by [facility]  . HIV nonspecific serology    2006: indeterminate HIV blood test, seen by ID, felt secondary to cross reacting antibodies with no further workup felt necessary at that time   . Hypertension   . Obesity   . Personal history of radiation therapy 2016   right  . Radiation 05/07/15-06/23/15   Right Breast    Past Surgical History:  Procedure Laterality Date  . BREAST BIOPSY Left 02/10/2015   malignant   . BREAST LUMPECTOMY Right 03/21/2015  . CHOLECYSTECTOMY  2003  . CYSTOSCOPY W/  LITHOLAPAXY / EHL    . JOINT REPLACEMENT     bilateral knee replacement  . LEFT AND RIGHT HEART CATHETERIZATION WITH CORONARY ANGIOGRAM N/A 05/02/2012   Procedure: LEFT AND RIGHT HEART CATHETERIZATION WITH CORONARY ANGIOGRAM;  Surgeon: Burnell Blanks, MD;  Location: [facility] CATH LAB;  Service: Cardiovascular;  Laterality: N/A;  . RADIOACTIVE SEED GUIDED PARTIAL MASTECTOMY WITH AXILLARY SENTINEL LYMPH NODE BIOPSY Right 03/21/2015   Procedure: RIGHT  PARTIAL MASTECTOMY WITH RADIOACTIVE SEED LOCALIZATION RIGHT  AXILLARY SENTINEL LYMPH NODE BIOPSY;  Surgeon: Fanny Skates, MD;  Location: [place];  Service: General;  Laterality: Right;  . REPLACEMENT TOTAL KNEE BILATERAL  2005 &2006  . VASCULAR SURGERY Right 03/15/2013   Ultrasound guided sclerotherapy    There were no vitals filed for this visit.   Subjective Assessment - 04/16/21 0903    Subjective Patient reports she is doing well. She has been doing some more walking around her block and this has been going well. Notes a little pain this morning.    Patient Stated Goals stand up longer, be able to move and walk, scared to walk by herself.    Currently in Pain? Yes    Pain Score 3  Pain Location Foot    Pain Orientation Right;Left    Pain Descriptors / Indicators Dull    Pain Type Chronic pain    Pain Onset More than a month ago    Pain Frequency Intermittent    Aggravating Factors  Standing, extended walking              [facility] PT Assessment - 04/16/21 0001      Strength   Right Hip Extension 3+/5    Right Hip ABduction 3+/5    Left Hip Extension 3+/5    Left Hip ABduction 3+/5                         [facility] Adult PT Treatment/Exercise - 04/16/21 0001      Neuro Re-ed    Neuro Re-ed Details  Standard stance on Airex with head turns and nods 2 x 20 each, standard stance on Airex with forward/lateral reach 2 x 20 (3 cones placed in arc on counter ~2.5 ft apart, different colors and patient  reached to specific color based on PT call), 4-way volitional stepping each leg with EO and EC x 10 each      Exercises   Exercises Ankle      Knee/Hip Exercises: Aerobic   Nustep L5 x 5 min with LE      Knee/Hip Exercises: Standing   Heel Raises 2 sets;15 reps    Heel Raises Limitations heel-toe with 4#    Hip Flexion 2 sets;20 reps    Hip Flexion Limitations 4#    Hip Abduction 2 sets;10 reps    Abduction Limitations 4#    Hip Extension 2 sets;10 reps    Extension Limitations 4#      Knee/Hip Exercises: Seated   Long Arc Quad 2 sets;15 reps    Long Arc Quad Weight 4 lbs.    Sit to Sand 2 sets;10 reps;without UE support                  PT Education - 04/16/21 0904    Education Details HEP    Person(s) Educated Patient    Methods Explanation    Comprehension Verbalized understanding;Need further instruction            PT Short Term Goals - 02/19/21 1128      PT SHORT TERM GOAL #1   Title Pt will be independent with initial HEP    Status Achieved      PT SHORT TERM GOAL #2   Title PT will go over FOTO results with pt.    Status Achieved    Target Date 01/28/21             PT Long Term Goals - 04/01/21 1004      PT LONG TERM GOAL #1   Title Pt will be independent with advanced HEP    Baseline HEP updated this visit    Time 6    Period Weeks    Status On-going    Target Date 04/28/21      PT LONG TERM GOAL #2   Title Pt will achieve 34/56 on the Berg Balance test to show a detectable change with balance interventions and to decrease the pts risk for falls.    Baseline 33/56 - not assessed this visit    Time 6    Period Weeks    Status On-going    Target Date 04/28/21      PT LONG  TERM GOAL #3   Title Pt will improve bilateral dorsiflexion to 10 degrees in order to decrease pain in bilateral feet.    Baseline 0 degrees on R and 3 degrees on L of dorsiflexion - not assessed this visit    Time 6    Period Weeks    Status On-going      PT  LONG TERM GOAL #4   Title Pt will improve FOTO from 49% to predicted 60% in order to show functional improvement.    Baseline 64% on 02/04/21    Status Achieved                 Plan - 04/16/21 0905    Clinical Impression Statement Patient tolerated therapy well this visit. She notes continued improvement in pain and balance, and she is exhibiting improved control and less instability or LOBs with exercise this visit. She is improving with volitonal stepping exercises and with dynamic balance on unstable surface. No change to HEP with visit. Patient would benefit from continued skilled PT to progress strength and balance challenges in order to reduce fall risk and maximize functional ability.    PT Treatment/Interventions Electrical Stimulation;Iontophoresis 4mg /ml Dexamethasone;Moist Heat;Cryotherapy;Ultrasound;Gait training;Stair training;Functional mobility training;Therapeutic activities;Therapeutic exercise;Balance training;Neuromuscular re-education;Patient/family education;Manual techniques;Taping;Dry needling;Vasopneumatic Device    PT Next Visit Plan Reassessment next visit, possible discharge, continue focus on general LE strength and balance training    PT Home Exercise Plan [place] and Agree with Plan of Care Patient           Patient will benefit from skilled therapeutic intervention in order to improve the following deficits and impairments:  Decreased balance,Decreased mobility,Difficulty walking,Decreased activity tolerance,Decreased strength,Abnormal gait,Decreased range of motion,Cardiopulmonary status limiting activity,Decreased endurance,Decreased safety awareness,Pain,Impaired flexibility,Increased edema,Increased fascial restricitons  Visit Diagnosis: Unsteadiness on feet  Other abnormalities of gait and mobility  Muscle weakness (generalized)  Pain in left foot  Pain in right foot  Stiffness of right foot, not elsewhere classified  Stiffness  of left foot, not elsewhere classified     Problem List Patient Active Problem List   Diagnosis Date Noted  . Tinea pedis 04/09/2020  . Spinal stenosis of lumbar region without neurogenic claudication 02/08/2019  . Nephropathy, diabetic ([place]) 02/07/2019  . Gout 05/16/2018  . Gait instability 01/03/2018  . Frequent bowel movements 11/02/2017  . Tremor of both hands   . Stuttering   . Essential hypertension   . Hyperlipidemia 08/13/2015  . Breast cancer of lower-outer quadrant of right female breast ([place]) 02/13/2015  . Vitamin D deficiency 02/06/2015  . Type 2 diabetes mellitus with diabetic foot deformity ([place]) 01/01/2014  . Chronic combined systolic and diastolic heart failure ([place]) 02/26/2013  . OBESITY 02/05/2009  . Asthma, intermittent 02/08/2008  . NEPHROLITHIASIS 01/26/2007  . DJD (degenerative joint disease), multiple sites 01/26/2007    Hilda Blades, PT, DPT, LAT, ATC 04/16/21  9:52 AM Phone: [phone] Fax: [facility] [address] [place], [place], [address] Phone: [phone]   Fax:  [phone]  Name: [name] MRN: [id number] Date of Birth: 09/14/1951

## 2021-04-22 ENCOUNTER — Other Ambulatory Visit: Payer: Self-pay

## 2021-04-22 ENCOUNTER — Encounter: Payer: Self-pay | Admitting: Podiatry

## 2021-04-22 ENCOUNTER — Ambulatory Visit (INDEPENDENT_AMBULATORY_CARE_PROVIDER_SITE_OTHER): Payer: Medicare Other | Admitting: Podiatry

## 2021-04-22 DIAGNOSIS — M2010 Hallux valgus (acquired), unspecified foot: Secondary | ICD-10-CM | POA: Diagnosis not present

## 2021-04-22 DIAGNOSIS — B351 Tinea unguium: Secondary | ICD-10-CM

## 2021-04-22 DIAGNOSIS — E1140 Type 2 diabetes mellitus with diabetic neuropathy, unspecified: Secondary | ICD-10-CM

## 2021-04-22 NOTE — Progress Notes (Signed)
This patient returns to my office for at risk foot care.  This patient requires this care by a professional since this patient will be at risk due to having diabetes.   This patient is unable to cut nails herself since the patient cannot reach her nails.These nails are painful walking and wearing shoes.  This patient presents for at risk foot care today.  General Appearance  Alert, conversant and in no acute stress.  Vascular  Dorsalis pedis and posterior tibial  pulses are palpable  bilaterally.  Capillary return is within normal limits  bilaterally. Temperature is within normal limits  bilaterally.  Neurologic  Senn-Weinstein monofilament wire test absent   bilaterally. Muscle power within normal limits bilaterally.  Nails Thick disfigured discolored nails with subungual debris  from hallux to fifth toes bilaterally. No evidence of bacterial infection or drainage bilaterally.  Orthopedic  No limitations of motion  feet .  No crepitus or effusions noted.  No bony pathology or digital deformities noted. Asymptomatic  HAV  B/L.  Pes planus  B/L.  Skin  normotropic skin with no porokeratosis noted bilaterally.  No signs of infections or ulcers noted.   White mascerates skin interdigitally digits  Right foot.  Onychomycosis  Pain in right toes  Pain in left toes  Tinea pedis  Right foot.  Prescribe lotrimin-af  2 % aerosol spray.  Consent was obtained for treatment procedures.   Mechanical debridement of nails 1-5  bilaterally performed with a nail nipper.  Filed with dremel without incident.    Return office visit  3 months                    Told patient to return for periodic foot care and evaluation due to potential at risk complications   Gardiner Barefoot DPM

## 2021-04-23 ENCOUNTER — Other Ambulatory Visit: Payer: Self-pay

## 2021-04-23 ENCOUNTER — Ambulatory Visit: Payer: Medicare Other | Admitting: Physical Therapy

## 2021-04-23 ENCOUNTER — Encounter: Payer: Self-pay | Admitting: Physical Therapy

## 2021-04-23 DIAGNOSIS — M79671 Pain in right foot: Secondary | ICD-10-CM | POA: Diagnosis not present

## 2021-04-23 DIAGNOSIS — M79672 Pain in left foot: Secondary | ICD-10-CM | POA: Diagnosis not present

## 2021-04-23 DIAGNOSIS — R2689 Other abnormalities of gait and mobility: Secondary | ICD-10-CM

## 2021-04-23 DIAGNOSIS — M6281 Muscle weakness (generalized): Secondary | ICD-10-CM

## 2021-04-23 DIAGNOSIS — R2681 Unsteadiness on feet: Secondary | ICD-10-CM

## 2021-04-23 DIAGNOSIS — M25675 Stiffness of left foot, not elsewhere classified: Secondary | ICD-10-CM

## 2021-04-23 DIAGNOSIS — M25674 Stiffness of right foot, not elsewhere classified: Secondary | ICD-10-CM

## 2021-04-23 NOTE — Therapy (Signed)
[place], [place], [address] Phone: [phone]   Fax:  [phone]  Physical Therapy Discharge / Treatment  Patient Details  Name: [name] MRN: [id number] Date of Birth: 01/13/1951 Referring Provider (PT): Evelina Bucy, DPM   Encounter Date: 04/23/2021   PT End of Session - 04/23/21 1037    Visit Number 17    Number of Visits 18    Date for PT Re-Evaluation 04/28/21    Authorization Type [facility] MCR/ MCD 2022    Authorization Time Period Apply KX modifier    Progress Note Due on Visit 18    PT Start Time 1034    PT Stop Time 1118    PT Time Calculation (min) 44 min    Activity Tolerance Patient tolerated treatment well    Behavior During Therapy [facility] for tasks assessed/performed           Past Medical History:  Diagnosis Date  . Anemia   . Arthritis   . Asthma   . Blood transfusion without reported diagnosis    Patients believes 2015 when she overdosed on "medications"   . Breast cancer ([place])   . Breast cancer of lower-outer quadrant of right female breast ([place]) 02/13/2015  . Cataract   . CHF, acute ([place]) 05/03/2012  . Depression   . Diabetes mellitus   . Eczema   . Fatty liver    Fatty infiltration of liver noted on 03/2012 CT scan  . Fibromyalgia   . Glaucoma   . Heart disease   . History of kidney stones    w/ hx of hydronephrosis - followed by [facility]  . HIV nonspecific serology    2006: indeterminate HIV blood test, seen by ID, felt secondary to cross reacting antibodies with no further workup felt necessary at that time   . Hypertension   . Obesity   . Personal history of radiation therapy 2016   right  . Radiation 05/07/15-06/23/15   Right Breast    Past Surgical History:  Procedure Laterality Date  . BREAST BIOPSY Left 02/10/2015   malignant   . BREAST LUMPECTOMY Right 03/21/2015  . CHOLECYSTECTOMY  2003  . CYSTOSCOPY W/ LITHOLAPAXY / EHL    . JOINT  REPLACEMENT     bilateral knee replacement  . LEFT AND RIGHT HEART CATHETERIZATION WITH CORONARY ANGIOGRAM N/A 05/02/2012   Procedure: LEFT AND RIGHT HEART CATHETERIZATION WITH CORONARY ANGIOGRAM;  Surgeon: Burnell Blanks, MD;  Location: [facility] CATH LAB;  Service: Cardiovascular;  Laterality: N/A;  . RADIOACTIVE SEED GUIDED PARTIAL MASTECTOMY WITH AXILLARY SENTINEL LYMPH NODE BIOPSY Right 03/21/2015   Procedure: RIGHT  PARTIAL MASTECTOMY WITH RADIOACTIVE SEED LOCALIZATION RIGHT  AXILLARY SENTINEL LYMPH NODE BIOPSY;  Surgeon: Fanny Skates, MD;  Location: [place];  Service: General;  Laterality: Right;  . REPLACEMENT TOTAL KNEE BILATERAL  2005 &2006  . VASCULAR SURGERY Right 03/15/2013   Ultrasound guided sclerotherapy    There were no vitals filed for this visit.   Subjective Assessment - 04/23/21 1034    Subjective Patient reports she is doing well with no new issues. She reports consistency with exercises at home. States her feet are feeling good.    Patient Stated Goals stand up longer, be able to move and walk, scared to walk by herself.    Currently in Pain? No/denies              [facility] PT Assessment - 04/23/21 0001  Assessment   Medical Diagnosis Plantar fasciitis bilateral    Referring Provider (PT) Evelina Bucy, DPM    Next MD Visit Not scheduled      Precautions   Precautions Fall      Restrictions   Weight Bearing Restrictions No      Balance Screen   Has the patient fallen in the past 6 months No   no falls since start of therapy     Prior Function   Level of Independence Independent with basic ADLs;Independent with household mobility with device;Independent with community mobility with device    Leisure None reported      Cognition   Overall Cognitive Status Within Functional Limits for tasks assessed      Observation/Other Assessments   Focus on Therapeutic Outcomes (FOTO)  64%   assessed 02/04/21     AROM   Right Ankle  Dorsiflexion 5    Left Ankle Dorsiflexion 7      Ambulation/Gait   Ambulation/Gait Yes    Ambulation/Gait Assistance 6: Modified independent (Device/Increase time)    Assistive device Rollator      Berg Balance Test   Sit to Stand Able to stand without using hands and stabilize independently    Standing Unsupported Able to stand safely 2 minutes    Sitting with Back Unsupported but Feet Supported on Floor or Stool Able to sit safely and securely 2 minutes    Stand to Sit Sits safely with minimal use of hands    Transfers Able to transfer safely, minor use of hands    Standing Unsupported with Eyes Closed Able to stand 10 seconds with supervision    Standing Unsupported with Feet Together Able to place feet together independently and stand 1 minute safely    From Standing, Reach Forward with Outstretched Arm Can reach forward >12 cm safely (5")    From Standing Position, Pick up Object from Floor Unable to pick up and needs supervision    From Standing Position, Turn to Look Behind Over each Shoulder Turn sideways only but maintains balance    Turn 360 Degrees Able to turn 360 degrees safely but slowly    Standing Unsupported, Alternately Place Feet on Step/Stool Able to complete >2 steps/needs minimal assist    Standing Unsupported, One Foot in Front Needs help to step but can hold 15 seconds    Standing on One Leg Tries to lift leg/unable to hold 3 seconds but remains standing independently    Total Score 38                         [facility] Adult PT Treatment/Exercise - 04/23/21 0001      Self-Care   Self-Care Other Self-Care Comments    Other Self-Care Comments  Berg performance and dorisflexion assessment and then discussion of results, POC discharge and follow-up instruction      Neuro Re-ed    Neuro Re-ed Details  4-way volitional stepping each leg with EO x 10 each; tandem stance x 30 sec each, romberg with head turns and nods x 10 each      Knee/Hip Exercises:  Stretches   Gastroc Stretch 2 reps;30 seconds    Gastroc Stretch Limitations standing at counter      Knee/Hip Exercises: Standing   Heel Raises 2 sets;20 reps    Hip Flexion 2 sets;20 reps    Hip Abduction 2 sets;10 reps    Hip Extension 2 sets;10 reps  Knee/Hip Exercises: Seated   Sit to Sand 2 sets;10 reps;without UE support                  PT Education - 04/23/21 1037    Education Details POC discharge, exam findings with BERG, HEP    Person(s) Educated Patient    Methods Explanation    Comprehension Verbalized understanding            PT Short Term Goals - 02/19/21 1128      PT SHORT TERM GOAL #1   Title Pt will be independent with initial HEP    Status Achieved      PT SHORT TERM GOAL #2   Title PT will go over FOTO results with pt.    Status Achieved    Target Date 01/28/21             PT Long Term Goals - 04/23/21 1039      PT LONG TERM GOAL #1   Title Pt will be independent with advanced HEP    Baseline Patient demonstrates independence with HEP - 04/23/21    Time 6    Period Weeks    Status Achieved      PT LONG TERM GOAL #2   Title Pt will achieve 34/56 on the Berg Balance test to show a detectable change with balance interventions and to decrease the pts risk for falls.    Baseline 38/56 - 04/23/21    Time 6    Period Weeks    Status Achieved      PT LONG TERM GOAL #3   Title Pt will improve bilateral dorsiflexion to 10 degrees in order to decrease pain in bilateral feet.    Baseline Right 5 deg, Left 7 deg - 04/23/21    Time 6    Period Weeks    Status Partially Met      PT LONG TERM GOAL #4   Title Pt will improve FOTO from 49% to predicted 60% in order to show functional improvement.    Baseline 64% on 02/04/21    Status Achieved                 Plan - 04/23/21 1038    Clinical Impression Statement Patient tolerated therapy well with no adverse effects. Patient exhibits great improvement on BERG balance assessment  and achieved her LTG, but does continue to exhibit ankle stiffness. Patient demonstrates independence with HEP and balance exercises, she is pleased with her current functional level and is ready to transition to independent exercise. Patient will be formally dicharged from PT as it is no longer indicated at this time.    PT Treatment/Interventions Electrical Stimulation;Iontophoresis 15m/ml Dexamethasone;Moist Heat;Cryotherapy;Ultrasound;Gait training;Stair training;Functional mobility training;Therapeutic activities;Therapeutic exercise;Balance training;Neuromuscular re-education;Patient/family education;Manual techniques;Taping;Dry needling;Vasopneumatic Device    PT Next Visit Plan NA - discharge    PT [place] Agree with Plan of Care Patient           Patient will benefit from skilled therapeutic intervention in order to improve the following deficits and impairments:  Decreased balance,Decreased mobility,Difficulty walking,Decreased activity tolerance,Decreased strength,Abnormal gait,Decreased range of motion,Cardiopulmonary status limiting activity,Decreased endurance,Decreased safety awareness,Pain,Impaired flexibility,Increased edema,Increased fascial restricitons  Visit Diagnosis: Unsteadiness on feet  Other abnormalities of gait and mobility  Muscle weakness (generalized)  Pain in left foot  Pain in right foot  Stiffness of right foot, not elsewhere classified  Stiffness of left foot, not elsewhere classified  Problem List Patient Active Problem List   Diagnosis Date Noted  . Tinea pedis 04/09/2020  . Spinal stenosis of lumbar region without neurogenic claudication 02/08/2019  . Nephropathy, diabetic ([place]) 02/07/2019  . Gout 05/16/2018  . Gait instability 01/03/2018  . Frequent bowel movements 11/02/2017  . Tremor of both hands   . Stuttering   . Essential hypertension   . Hyperlipidemia 08/13/2015  . Breast cancer of  lower-outer quadrant of right female breast ([place]) 02/13/2015  . Vitamin D deficiency 02/06/2015  . Type 2 diabetes mellitus with diabetic foot deformity ([place]) 01/01/2014  . Chronic combined systolic and diastolic heart failure ([place]) 02/26/2013  . OBESITY 02/05/2009  . Asthma, intermittent 02/08/2008  . NEPHROLITHIASIS 01/26/2007  . DJD (degenerative joint disease), multiple sites 01/26/2007    Hilda Blades, PT, DPT, LAT, ATC 04/23/21  11:27 AM Phone: [phone] Fax: [facility] [address] [place], [place], [address] Phone: [phone]   Fax:  [phone]  Name: ARIANNY PUN MRN: [id number] Date of Birth: Jan 31, 1951   PHYSICAL THERAPY DISCHARGE SUMMARY  Visits from Start of Care: 17  Current functional level related to goals / functional outcomes: See above   Remaining deficits: See above   Education / Equipment: HEP Plan: Patient agrees to discharge.  Patient goals were partially met. Patient is being discharged due to being pleased with the current functional level.  ?????

## 2021-04-23 NOTE — Patient Instructions (Signed)
Access Code: [email or web address] URL: [email or web address] Date: 04/23/2021 Prepared by: Hilda Blades  Exercises Standing Gastroc Stretch at Counter - 1 x daily - 7 x weekly - 2 reps - 30 hold Sit to Stand - 1 x daily - 7 x weekly - 2 sets - 10 reps Heel Toe Raises with Counter Support - 1 x daily - 7 x weekly - 2 sets - 20 reps Standing March with Counter Support - 1 x daily - 7 x weekly - 2 sets - 20 reps Standing Hip Abduction with Counter Support - 1 x daily - 7 x weekly - 2 sets - 10 reps Standing Hip Extension with Counter Support - 1 x daily - 7 x weekly - 2 sets - 10 reps Standing Tandem Balance with Counter Support - 1 x daily - 7 x weekly - 2 sets - 30 hold Alternating Step Forward with Support - 1 x daily - 7 x weekly - 2 sets - 10 reps Alternating Step Backward with Support - 1 x daily - 7 x weekly - 2 sets - 10 reps Step Sideways - 1 x daily - 7 x weekly - 2 sets - 10 reps Romberg Stance with Head Rotation - 1 x daily - 7 x weekly - 2 sets - 10 reps Romberg Stance with Head Nods - 1 x daily - 7 x weekly - 2 sets - 10 reps

## 2021-04-30 ENCOUNTER — Other Ambulatory Visit: Payer: Self-pay | Admitting: Family Medicine

## 2021-05-06 ENCOUNTER — Ambulatory Visit (INDEPENDENT_AMBULATORY_CARE_PROVIDER_SITE_OTHER): Payer: Medicare Other | Admitting: Family Medicine

## 2021-05-06 ENCOUNTER — Encounter: Payer: Self-pay | Admitting: Family Medicine

## 2021-05-06 ENCOUNTER — Other Ambulatory Visit: Payer: Self-pay

## 2021-05-06 VITALS — BP 154/81 | HR 76 | Wt 210.8 lb

## 2021-05-06 DIAGNOSIS — E1169 Type 2 diabetes mellitus with other specified complication: Secondary | ICD-10-CM | POA: Diagnosis not present

## 2021-05-06 DIAGNOSIS — M21969 Unspecified acquired deformity of unspecified lower leg: Secondary | ICD-10-CM | POA: Diagnosis not present

## 2021-05-06 DIAGNOSIS — I10 Essential (primary) hypertension: Secondary | ICD-10-CM

## 2021-05-06 DIAGNOSIS — M1000 Idiopathic gout, unspecified site: Secondary | ICD-10-CM

## 2021-05-06 DIAGNOSIS — I5042 Chronic combined systolic (congestive) and diastolic (congestive) heart failure: Secondary | ICD-10-CM

## 2021-05-06 LAB — POCT GLYCOSYLATED HEMOGLOBIN (HGB A1C): HbA1c, POC (controlled diabetic range): 9.2 % — AB (ref 0.0–7.0)

## 2021-05-06 MED ORDER — METFORMIN HCL ER 500 MG PO TB24: 1000.0000 mg | ORAL_TABLET | Freq: Every day | ORAL | 1 refills | Status: DC

## 2021-05-06 NOTE — Assessment & Plan Note (Signed)
BP Readings from Last 3 Encounters:  05/06/21 (!) 154/81  01/27/21 (!) 142/82  12/31/20 (!) 164/94   Elevated today.  Maybe due to diet changes.  Will monitor

## 2021-05-06 NOTE — Progress Notes (Signed)
    SUBJECTIVE:   CHIEF COMPLAINT / HPI:   Diabetes Brings in all her medications.  Taking metformin 500 mg daily.  No low blood sugar. Her home readings in the 200s.  Is not exercising as much and admits to eating more poorly  Hypertension Brings in her medications.  No lightheadness or falls.  Has stopped lasix completely   Gout No recent attacks.  Is not taking colchicine regularly since we decided to taper last visit.   PERTINENT  PMH / PSH: asks for handicap sticker  OBJECTIVE:   BP (!) 154/81   Pulse 76   Wt 210 lb 12.8 oz (95.6 kg)   SpO2 98%   BMI 38.56 kg/m   Using walker Heart - Regular rate and rhythm.  No murmurs, gallops or rubs.    Lungs:  Normal respiratory effort, chest expands symmetrically. Lungs are clear to auscultation, no crackles or wheezes. Extrem - no pitting edema   ASSESSMENT/PLAN:   Essential hypertension BP Readings from Last 3 Encounters:  05/06/21 (!) 154/81  01/27/21 (!) 142/82  12/31/20 (!) 164/94   Elevated today.  Maybe due to diet changes.  Will monitor   Type 2 diabetes mellitus with diabetic foot deformity Not at goal.  Will go back to metformin 1000 mg.  Likely diet related.  She will work on this.    Gout Under control without daily colchicine   Chronic combined systolic and diastolic heart failure ([place]) No edema or shortness of breath completely off lasix      Lind Covert, MD [place]

## 2021-05-06 NOTE — Assessment & Plan Note (Signed)
Not at goal.  Will go back to metformin 1000 mg.  Likely diet related.  She will work on this.

## 2021-05-06 NOTE — Patient Instructions (Addendum)
Good to see you today - Thank you for coming in  Things we discussed today:  Increase metformin to 2 tabs a day  Get out and exercise more  Call me if your blood sugar are not less than 200 most days  Please always bring your medication bottles  Come back to see me in 3 months

## 2021-05-06 NOTE — Assessment & Plan Note (Signed)
Under control without daily colchicine

## 2021-05-06 NOTE — Assessment & Plan Note (Signed)
No edema or shortness of breath completely off lasix

## 2021-05-19 ENCOUNTER — Telehealth: Payer: Self-pay

## 2021-05-19 NOTE — Telephone Encounter (Signed)
Patient LVM on nurse line to discuss blood sugar readings continuing to be elevated. Attempted to call patient back. No answer, mailbox was full.   Will try to call again later.   Talbot Grumbling, RN

## 2021-05-28 ENCOUNTER — Other Ambulatory Visit: Payer: Self-pay | Admitting: Family Medicine

## 2021-05-28 DIAGNOSIS — M21969 Unspecified acquired deformity of unspecified lower leg: Secondary | ICD-10-CM

## 2021-06-04 ENCOUNTER — Other Ambulatory Visit: Payer: Self-pay | Admitting: Family Medicine

## 2021-06-08 ENCOUNTER — Telehealth: Payer: Self-pay

## 2021-06-08 ENCOUNTER — Ambulatory Visit (HOSPITAL_COMMUNITY)
Admission: EM | Admit: 2021-06-08 | Discharge: 2021-06-08 | Disposition: A | Discharge status: Home / Self Care | Payer: Medicare Other | Attending: Emergency Medicine | Admitting: Emergency Medicine

## 2021-06-08 ENCOUNTER — Encounter (HOSPITAL_COMMUNITY): Payer: Self-pay

## 2021-06-08 ENCOUNTER — Other Ambulatory Visit: Payer: Self-pay

## 2021-06-08 DIAGNOSIS — L02412 Cutaneous abscess of left axilla: Secondary | ICD-10-CM

## 2021-06-08 DIAGNOSIS — L03114 Cellulitis of left upper limb: Secondary | ICD-10-CM | POA: Diagnosis not present

## 2021-06-08 MED ORDER — NAPROXEN 500 MG PO TABS: 500.0000 mg | ORAL_TABLET | Freq: Two times a day (BID) | ORAL | 0 refills | Status: DC

## 2021-06-08 MED ORDER — CEPHALEXIN 500 MG PO CAPS: 500.0000 mg | ORAL_CAPSULE | Freq: Four times a day (QID) | ORAL | 0 refills | Status: DC

## 2021-06-08 NOTE — Discharge Instructions (Addendum)
Since the area is already draining so we do not need to open any further Wash dry area and keep clean.  You will need to keep covered and use paper tape to not tear your skin  Follow up with primary care doctor in a few days  Take pain meds as needed

## 2021-06-08 NOTE — ED Triage Notes (Signed)
Pt reports having an abscess in the left axilla x 2 weeks.

## 2021-06-08 NOTE — ED Provider Notes (Signed)
[place]    CSN: [id number] Arrival date & time: 06/08/21  1009      History   Chief Complaint Chief Complaint  Patient presents with   Abscess    HPI [name] is a 70 y.o. female.   Pt is here for abscess to lt arm pit for approx 1 week. It is draining at this time. Pt has had several of these before and they drain on there own. She is pain and is worried about infection. Has only taken her daily meds and tylenol. Denies any n/v or fever.    Past Medical History:  Diagnosis Date   Anemia    Arthritis    Asthma    Blood transfusion without reported diagnosis    Patients believes 2015 when she overdosed on "medications"    Breast cancer ([place])    Breast cancer of lower-outer quadrant of right female breast ([place]) 02/13/2015   Cataract    CHF, acute ([place]) 05/03/2012   Depression    Diabetes mellitus    Eczema    Fatty liver    Fatty infiltration of liver noted on 03/2012 CT scan   Fibromyalgia    Glaucoma    Heart disease    History of kidney stones    w/ hx of hydronephrosis - followed by [facility]   HIV nonspecific serology    2006: indeterminate HIV blood test, seen by ID, felt secondary to cross reacting antibodies with no further workup felt necessary at that time    Hypertension    Obesity    Personal history of radiation therapy 2016   right   Radiation 05/07/15-06/23/15   Right Breast    Patient Active Problem List   Diagnosis Date Noted   Tinea pedis 04/09/2020   Spinal stenosis of lumbar region without neurogenic claudication 02/08/2019   Nephropathy, diabetic ([place]) 02/07/2019   Gout 05/16/2018   Gait instability 01/03/2018   Frequent bowel movements 11/02/2017   Tremor of both hands    Stuttering    Essential hypertension    Hyperlipidemia 08/13/2015   Breast cancer of lower-outer quadrant of right female breast ([place]) 02/13/2015   Vitamin D deficiency 02/06/2015   Type 2 diabetes mellitus with diabetic foot deformity ([place])  01/01/2014   Chronic combined systolic and diastolic heart failure ([place]) 02/26/2013   OBESITY 02/05/2009   Asthma, intermittent 02/08/2008   NEPHROLITHIASIS 01/26/2007   DJD (degenerative joint disease), multiple sites 01/26/2007    Past Surgical History:  Procedure Laterality Date   BREAST BIOPSY Left 02/10/2015   malignant    BREAST LUMPECTOMY Right 03/21/2015   CHOLECYSTECTOMY  2003   CYSTOSCOPY W/ LITHOLAPAXY / E[facility]     JOINT REPLACEMENT     bilateral knee replacement   LEFT AND RIGHT HEART CATHETERIZATION WITH CORONARY ANGIOGRAM N/A 05/02/2012   Procedure: LEFT AND RIGHT HEART CATHETERIZATION WITH CORONARY ANGIOGRAM;  Surgeon: Burnell Blanks, MD;  Location: [facility] CATH LAB;  Service: Cardiovascular;  Laterality: N/A;   RADIOACTIVE SEED GUIDED PARTIAL MASTECTOMY WITH AXILLARY SENTINEL LYMPH NODE BIOPSY Right 03/21/2015   Procedure: RIGHT  PARTIAL MASTECTOMY WITH RADIOACTIVE SEED LOCALIZATION RIGHT  AXILLARY SENTINEL LYMPH NODE BIOPSY;  Surgeon: Fanny Skates, MD;  Location: [place];  Service: General;  Laterality: Right;   REPLACEMENT TOTAL KNEE BILATERAL  2005 &2006   VASCULAR SURGERY Right 03/15/2013   Ultrasound guided sclerotherapy    OB History   No obstetric history on file.  Home Medications    Prior to Admission medications   Medication Sig Start Date End Date Taking? Authorizing Provider  cephALEXin (KEFLEX) 500 MG capsule Take 1 capsule (500 mg total) by mouth 4 (four) times daily. 06/08/21  Yes Marney Setting, NP  naproxen (NAPROSYN) 500 MG tablet Take 1 tablet (500 mg total) by mouth 2 (two) times daily. 06/08/21  Yes Marney Setting, NP  acetaminophen (TYLENOL) 650 MG CR tablet Take 650 mg by mouth every 8 (eight) hours as needed for pain.    [provider]  allopurinol (ZYLOPRIM) 100 MG tablet TAKE 1 TABLET BY MOUTH  DAILY 08/05/20   Lind Covert, MD  atorvastatin (LIPITOR) 40 MG tablet TAKE 1 TABLET BY MOUTH   DAILY 11/12/20   Lind Covert, MD  BD AUTOSHIELD DUO 30G X 5 MM MISC USE ONCE DAILY TO INJECT  INSULIN AS DIRECTED 12/05/20   Lind Covert, MD  Blood Glucose Monitoring Suppl ([place]) w/Device KIT USE AS DIRECTED 11/01/19   Lind Covert, MD  colchicine 0.6 MG tablet TAKE 1 TABLET BY MOUTH  DAILY Patient not taking: Reported on 05/06/2021 04/15/21   Lind Covert, MD  dorzolamide (TRUSOPT) 2 % ophthalmic solution 1 drop daily. 09/16/20   [provider]  dorzolamide-timolol (COSOPT) 22.3-6.8 MG/ML ophthalmic solution Place 1 drop into both eyes 2 (two) times daily. 07/02/17   [provider]  EASY COMFORT PEN NEEDLES 31G X 8 MM MISC USE TO INJECT INSULIN EVERY DAY AS DIRECTED 04/07/21   Chambliss, Jeb Levering, MD  empagliflozin (JARDIANCE) 25 MG TABS tablet Take 1 tablet (25 mg total) by mouth daily. 10/29/20   Lind Covert, MD  FLOVENT HFA 110 MCG/ACT inhaler USE 2 INHALATIONS BY MOUTH  TWICE DAILY 10/27/20   Lind Covert, MD  gabapentin (NEURONTIN) 100 MG capsule TAKE 2 CAPSULES BY MOUTH  TWICE DAILY AS NEEDED 06/05/21   Lind Covert, MD  Lancet Devices [facility] DELICA PLUS LANCING) MISC USE ONCE DAILY 04/03/21   Chambliss, Jeb Levering, MD  latanoprost (XALATAN) 0.005 % ophthalmic solution Place 1 drop into both eyes at bedtime.  07/27/17   [provider]  losartan (COZAAR) 100 MG tablet TAKE 1 TABLET BY MOUTH AT  BEDTIME 04/15/21   Lind Covert, MD  metFORMIN (GLUCOPHAGE-XR) 500 MG 24 hr tablet Take 2 tablets (1,000 mg total) by mouth daily with breakfast. 05/06/21   Lind Covert, MD  metoprolol succinate (TOPROL-XL) 25 MG 24 hr tablet TAKE 1 TABLET BY MOUTH  DAILY 10/27/20   Lind Covert, MD  Miconazole Nitrate (LOTRIMIN AF) 2 % AERO Spray between toes once daily for 4 weeks. 04/09/20   Gardiner Barefoot, DPM  OneTouch Delica Lancets 83E MISC 1 Container by Does not apply route daily  as needed. 04/06/21   Lind Covert, MD  ONETOUCH VERIO test strip CHECK BLOOD SUGAR IN THE  MORNING AS DIRECTED 04/15/21   Lind Covert, MD  spironolactone (ALDACTONE) 25 MG tablet TAKE 1 TABLET BY MOUTH  DAILY 08/19/20   Lind Covert, MD  VICTOZA 18 MG/3ML SOPN INJECT SUBCUTANEOUSLY 1.8MG DAILY 05/28/21   Lind Covert, MD    Family History Family History  Problem Relation Age of Onset   Diabetes Mother    Stroke Mother    Heart disease Father    Anemia Father    Cancer Sister 92       nose cancer  Diabetes Sister        2 sisters type 1 diabetes    Kidney disease Sister        waiting for a kidney transplant- on dialysis   Diabetes Brother        Type 2 diabetic    Cancer Cousin 26       ovarian cancer    Cancer Cousin 22       ovarian cancer    Diabetes Son        Type 2    Hypertension Son     Social History Social History   Tobacco Use   Smoking status: Never   Smokeless tobacco: Never  Vaping Use   Vaping Use: Never used  Substance Use Topics   Alcohol use: No    Alcohol/week: 0.0 standard drinks   Drug use: No     Allergies   Patient has no known allergies.   Review of Systems Review of Systems  Constitutional:  Negative for activity change and fever.  Respiratory: Negative.    Cardiovascular: Negative.   Gastrointestinal: Negative.   Skin:  Positive for rash and wound.       Open thick drainage to lt arm pit area, redness.   Neurological: Negative.     Physical Exam Triage Vital Signs ED Triage Vitals  Enc Vitals Group     BP 06/08/21 1142 (!) 145/80     Pulse Rate 06/08/21 1142 70     Resp 06/08/21 1142 20     Temp 06/08/21 1142 98.6 F (37 C)     Temp Source 06/08/21 1142 Oral     SpO2 06/08/21 1142 100 %     Weight --      Height --      Head Circumference --      Peak Flow --      Pain Score 06/08/21 1139 8     Pain Loc --      Pain Edu? --      Excl. in [place]? --    No data found.  Updated  Vital Signs BP (!) 145/80 (BP Location: Left Arm)   Pulse 70   Temp 98.6 F (37 C) (Oral)   Resp 20   SpO2 100%   Visual Acuity     Physical Exam Constitutional:      Appearance: She is obese. She is not ill-appearing.  Cardiovascular:     Rate and Rhythm: Normal rate.  Pulmonary:     Effort: Pulmonary effort is normal.  Skin:    Findings: Erythema present.     Comments: Lt axillary dime size area of drainage thick serosanguinous moderate amount, painful to touch erythema noted surrounding tissues.   Neurological:     General: No focal deficit present.     Mental Status: She is alert.     UC Treatments / Results  Labs (all labs ordered are listed, but only abnormal results are displayed) Labs Reviewed - No data to display  EKG   Radiology No results found.  Procedures Procedures (including critical care time)  Medications Ordered in UC Medications - No data to display  Initial Impression / Assessment and Plan / UC Course  I have reviewed the triage vital signs and the nursing notes.  Pertinent labs & imaging results that were available during my care of the patient were reviewed by me and considered in my medical decision making (see chart for details).     Pt has hx of  these needs to see pcp for follow up  Discussed post care instructions  Take full dose of medications  Do not need to I&D are due to actively draining at this time   Final Clinical Impressions(s) / UC Diagnoses   Final diagnoses:  Abscess of left axilla  Cellulitis of left upper extremity     Discharge Instructions      Since the area is already draining so we do not need to open any further Wash dry area and keep clean.  You will need to keep covered and use paper tape to not tear your skin  Follow up with primary care doctor in a few days  Take pain meds as needed      ED Prescriptions     Medication Sig Dispense Auth. Provider   cephALEXin (KEFLEX) 500 MG capsule Take 1  capsule (500 mg total) by mouth 4 (four) times daily. 20 capsule Morley Kos L, NP   naproxen (NAPROSYN) 500 MG tablet Take 1 tablet (500 mg total) by mouth 2 (two) times daily. 30 tablet Marney Setting, NP      PDMP not reviewed this encounter.   Marney Setting, NP 06/08/21 1341

## 2021-06-08 NOTE — Telephone Encounter (Signed)
Patient LVM on nurse line requesting a call back to discuss an abscess under her arm. Patient does not leave any additional information. I called patient back, however no answer or option for VM.

## 2021-07-03 ENCOUNTER — Other Ambulatory Visit: Payer: Self-pay | Admitting: *Deleted

## 2021-07-03 NOTE — Patient Outreach (Signed)
[place] [facility]) Care Management  [id number]  [name] [id number] [phone]   [name] received telephone call from patient.  [place] compliance verified. Patient was requesting homebound vaccinations. RN referred patient to [facility] pharmacy for 2nd [place] booster.  Plan: [name] will give 2nd homebound [place] booster  [place] [facility] [phone]

## 2021-07-08 ENCOUNTER — Other Ambulatory Visit: Payer: Self-pay | Admitting: Family Medicine

## 2021-07-29 ENCOUNTER — Other Ambulatory Visit: Payer: Self-pay

## 2021-07-29 ENCOUNTER — Encounter: Payer: Self-pay | Admitting: Podiatry

## 2021-07-29 ENCOUNTER — Ambulatory Visit (INDEPENDENT_AMBULATORY_CARE_PROVIDER_SITE_OTHER): Payer: Medicare Other | Admitting: Podiatry

## 2021-07-29 DIAGNOSIS — E1140 Type 2 diabetes mellitus with diabetic neuropathy, unspecified: Secondary | ICD-10-CM | POA: Diagnosis not present

## 2021-07-29 DIAGNOSIS — B351 Tinea unguium: Secondary | ICD-10-CM | POA: Diagnosis not present

## 2021-07-29 NOTE — Progress Notes (Signed)
This patient returns to my office for at risk foot care.  This patient requires this care by a professional since this patient will be at risk due to having diabetes.   This patient is unable to cut nails herself since the patient cannot reach her nails.These nails are painful walking and wearing shoes.  This patient presents for at risk foot care today.  General Appearance  Alert, conversant and in no acute stress.  Vascular  Dorsalis pedis and posterior tibial  pulses are palpable  bilaterally.  Capillary return is within normal limits  bilaterally. Temperature is within normal limits  bilaterally.  Neurologic  Senn-Weinstein monofilament wire test absent   bilaterally. Muscle power within normal limits bilaterally.  Nails Thick disfigured discolored nails with subungual debris  from hallux to fifth toes bilaterally. No evidence of bacterial infection or drainage bilaterally.  Orthopedic  No limitations of motion  feet .  No crepitus or effusions noted.  No bony pathology or digital deformities noted. Asymptomatic  HAV  B/L.  Pes planus  B/L. Hammer toes second left foot.  Skin  normotropic skin with no porokeratosis noted bilaterally.  No signs of infections or ulcers noted.     Onychomycosis  Pain in right toes  Pain in left toes    Consent was obtained for treatment procedures.   Mechanical debridement of nails 1-5  bilaterally performed with a nail nipper.  Filed with dremel without incident. Asks to see Rick.   Return office visit  3 months                    Told patient to return for periodic foot care and evaluation due to potential at risk complications   [name] DPM  

## 2021-08-12 ENCOUNTER — Other Ambulatory Visit: Payer: Self-pay

## 2021-08-12 ENCOUNTER — Ambulatory Visit (INDEPENDENT_AMBULATORY_CARE_PROVIDER_SITE_OTHER): Payer: Medicare Other | Admitting: Family Medicine

## 2021-08-12 ENCOUNTER — Encounter: Payer: Self-pay | Admitting: Family Medicine

## 2021-08-12 VITALS — BP 155/97 | HR 78 | Wt 211.2 lb

## 2021-08-12 DIAGNOSIS — E1169 Type 2 diabetes mellitus with other specified complication: Secondary | ICD-10-CM | POA: Diagnosis not present

## 2021-08-12 DIAGNOSIS — E1340 Other specified diabetes mellitus with diabetic neuropathy, unspecified: Secondary | ICD-10-CM

## 2021-08-12 DIAGNOSIS — M21969 Unspecified acquired deformity of unspecified lower leg: Secondary | ICD-10-CM | POA: Diagnosis not present

## 2021-08-12 DIAGNOSIS — Z23 Encounter for immunization: Secondary | ICD-10-CM | POA: Diagnosis not present

## 2021-08-12 DIAGNOSIS — I10 Essential (primary) hypertension: Secondary | ICD-10-CM

## 2021-08-12 LAB — POCT GLYCOSYLATED HEMOGLOBIN (HGB A1C): HbA1c, POC (controlled diabetic range): 8.9 % — AB (ref 0.0–7.0)

## 2021-08-12 MED ORDER — ZOSTER VAC RECOMB ADJUVANTED 50 MCG/0.5ML IM SUSR: INTRAMUSCULAR | 1 refills | Status: DC

## 2021-08-12 NOTE — Assessment & Plan Note (Signed)
Worsened will increase gabapentin to 3 caps twice a day and see if improves

## 2021-08-12 NOTE — Assessment & Plan Note (Signed)
Elevated in office but good home readings.  Ask to bring her cuff next visit.  Continue current medications

## 2021-08-12 NOTE — Assessment & Plan Note (Signed)
No change and not at goal.  Will try off metformin completely to see if loose bowels improve.  If not the will resume and increase.  If do then will need to add other diabetes medication - Perhaps lantus

## 2021-08-12 NOTE — Patient Instructions (Signed)
Good to see you today - Thank you for coming in  Things we discussed today:  Diabetes Stop the metformin for two weeks - then call me an let me know how your bowels are.  Neuropathy Go up on the gabapentin to 3 caps twice a day to see if helps with the pain   Hypertension Keep taking all the medications as you are  I sent a prescription to your pharmacy for your Zoster vaccines to help prevent Shingles.  The shot may cause a sore arm and mild flu like symptoms for a few days.  You will need a second shot 2 months after your first.    Please always bring your medication bottles  Come back to see me in 1 month

## 2021-08-12 NOTE — Progress Notes (Signed)
    SUBJECTIVE:   CHIEF COMPLAINT / HPI:   Diabetes Brings in all her medications.  Taking metformin 500 mg daily. Did not go up since last visit  No low blood sugar. Her home readings in the 200s.  Is not exercising as much and admits to eating more poorly.  Relates that feels can't get out and walk because of her loose bowels.  Is unsure if metformin is causing   Hypertension Brings in her medications.  Home BP readings are 120-140/60-80s with majority < 1335/85. No lightheadness or falls.  Has stopped lasix completely    Neuropathy Feels pain in her limbs is worse.  We had been decreasing her gabapentin   Abscess Has resolved   OBJECTIVE:   BP (!) 155/97   Pulse 78   Wt 211 lb 3.2 oz (95.8 kg)   SpO2 100%   BMI 38.63 kg/m   Hands - FROM do feel cooler but good pulses Walks with walker Good spirits   ASSESSMENT/PLAN:   Essential hypertension Elevated in office but good home readings.  Ask to bring her cuff next visit.  Continue current medications   Type 2 diabetes mellitus with diabetic foot deformity No change and not at goal.  Will try off metformin completely to see if loose bowels improve.  If not the will resume and increase.  If do then will need to add other diabetes medication - Perhaps lantus   Neuropathy due to secondary diabetes ([place]) Worsened will increase gabapentin to 3 caps twice a day and see if improves      Lind Covert, MD [place]

## 2021-08-14 DIAGNOSIS — Z23 Encounter for immunization: Secondary | ICD-10-CM | POA: Diagnosis not present

## 2021-08-17 ENCOUNTER — Other Ambulatory Visit: Payer: Self-pay | Admitting: Family Medicine

## 2021-08-26 ENCOUNTER — Other Ambulatory Visit: Payer: Self-pay | Admitting: *Deleted

## 2021-08-27 NOTE — Patient Outreach (Signed)
[facility]) Care Management  [place]  6/60/6301   SHENEKA SCHROM [birth year] [phone]  RN Health Coach telephone call to patient.  [employer] compliance verified. Per patient her Fasting blood sugar was 253.  Patient stated that she is not having pain at this time. RN discussed healthy eating and foods to avoid. Her A1C has decreased from 9.2 to 8.9. RN discussed the importance of exercise. Patient has agreed to follow up outreach calls.  Encounter Medications:  Outpatient Encounter Medications as of 08/26/2021  Medication Sig Note   EASY COMFORT PEN NEEDLES 31G X 8 MM MISC USE TO INJECT INSULIN EVERY DAY AS DIRECTED    acetaminophen (TYLENOL) 650 MG CR tablet Take 650 mg by mouth every 8 (eight) hours as needed for pain. 08/14/2019: Takes 2 tablets in the morning, 1 tablet at noon, and 2 tablets at nights   allopurinol (ZYLOPRIM) 100 MG tablet TAKE 1 TABLET BY MOUTH  DAILY    atorvastatin (LIPITOR) 40 MG tablet TAKE 1 TABLET BY MOUTH  DAILY    BD AUTOSHIELD DUO 30G X 5 MM MISC USE ONCE DAILY TO INJECT  INSULIN AS DIRECTED    Blood Glucose Monitoring Suppl (ONETOUCH VERIO FLEX SYSTEM) w/Device KIT USE AS DIRECTED    colchicine 0.6 MG tablet TAKE 1 TABLET BY MOUTH  DAILY    dorzolamide (TRUSOPT) 2 % ophthalmic solution 1 drop daily.    dorzolamide-timolol (COSOPT) 22.3-6.8 MG/ML ophthalmic solution Place 1 drop into both eyes 2 (two) times daily.    FLOVENT HFA 110 MCG/ACT inhaler USE 2 INHALATIONS BY MOUTH  TWICE DAILY    gabapentin (NEURONTIN) 100 MG capsule TAKE 3 CAPSULES BY MOUTH  TWICE DAILY AS NEEDED    JARDIANCE 25 MG TABS tablet TAKE 1 TABLET BY MOUTH  DAILY    Lancet Devices (ONETOUCH DELICA PLUS LANCING) MISC USE ONCE DAILY    latanoprost (XALATAN) 0.005 % ophthalmic solution Place 1 drop into both eyes at bedtime.     losartan (COZAAR) 100 MG tablet TAKE 1 TABLET BY MOUTH AT  BEDTIME    metFORMIN (GLUCOPHAGE-XR) 500 MG 24 hr tablet Take 2 tablets  (1,000 mg total) by mouth daily with breakfast. (Patient not taking: Reported on 08/27/2021)    metoprolol succinate (TOPROL-XL) 25 MG 24 hr tablet TAKE 1 TABLET BY MOUTH  DAILY    Miconazole Nitrate (LOTRIMIN AF) 2 % AERO Spray between toes once daily for 4 weeks.    OneTouch Delica Lancets 54Y MISC 1 Container by Does not apply route daily as needed.    ONETOUCH VERIO test strip CHECK BLOOD SUGAR IN THE  MORNING AS DIRECTED    spironolactone (ALDACTONE) 25 MG tablet TAKE 1 TABLET BY MOUTH  DAILY    VICTOZA 18 MG/3ML SOPN INJECT SUBCUTANEOUSLY 1.8MG DAILY    Zoster Vaccine Adjuvanted [facility]) injection Inject 0.5 ml IM and Repeat in 2 months    No facility-administered encounter medications on file as of 08/26/2021.    Functional Status:  No flowsheet data found.  Fall/Depression Screening: Fall Risk  08/26/2021 08/12/2021 02/23/2021  Falls in the past year? 0 0 1  Number falls in past yr: 0 0 0  Injury with Fall? 0 0 0  Risk Factor Category  - - -  Risk for fall due to : History of fall(s);Impaired balance/gait;Impaired mobility - History of fall(s);Impaired balance/gait;Impaired mobility  Risk for fall due to: Comment - - -  Follow up Falls evaluation completed - Falls evaluation  completed  Comment - - -   PHQ 2/9 Scores 01/27/2021 10/29/2020 05/06/2020 02/21/2020 02/13/2020 11/27/2019 11/13/2019  PHQ - 2 Score _0 0 0 1 1  PHQ- 9 Score 7 7 - - - - -    Assessment:   Care Plan Care Plan : Diabetes Type 2 (Adult)  Updates made by Verlin Grills, RN since 08/27/2021 12:00 AM     Problem: Glycemic Management (Diabetes, Type 2)   Priority: Medium  Onset Date: 11/25/2020     Long-Range Goal: Glycemic Management Optimized   Start Date: 11/25/2020  Expected End Date: 02/25/2022  This Visit's Progress: Not on track  Recent Progress: On track  Priority: Medium  Note:   Evidence-based guidance:  Anticipate A1C testing (point-of-care) every 3 to 6 months based on goal  attainment.  Review mutually-set A1C goal or target range.  Anticipate use of antihyperglycemic with or without insulin and periodic adjustments; consider active involvement of pharmacist.  Provide medical nutrition therapy and development of individualized eating.  Compare self-reported symptoms of hypo or hyperglycemia to blood glucose levels, diet and fluid intake, current medications, psychosocial and physiologic stressors, change in activity and barriers to care adherence.  Promote self-monitoring of blood glucose levels.  Assess and address barriers to management plan, such as food insecurity, age, developmental ability, depression, anxiety, fear of hypoglycemia or weight gain, as well as medication cost, side effects and complicated regimen.  Consider referral to community-based diabetes education program, visiting nurse, community health worker or health coach.  Encourage regular dental care for treatment of periodontal disease; refer to dental provider when needed.   Notes:  [id number] Patient fasting blood sugar is elevated running in the 200's. Today fasting blood sugar is 253    Task: Alleviate Barriers to Glycemic Management   Note:   Care Management Activities:    - blood glucose monitoring encouraged - blood glucose readings reviewed - mutual A1C goal set or reviewed - resources required to improve adherence to care identified - use of blood glucose monitoring log promoted    Notes:     Problem: Disease Progression (Diabetes, Type 2)   Priority: Medium  Onset Date: 02/23/2021     Long-Range Goal: Disease Progression Prevented or Minimized   Start Date: 02/23/2021  Expected End Date: 02/25/2022  This Visit's Progress: On track  Recent Progress: On track  Priority: Medium  Note:   Evidence-based guidance:  Prepare patient for laboratory and diagnostic exams based on risk and presentation.  Encourage lifestyle changes, such as increased intake of plant-based foods,  stress reduction, consistent physical activity and smoking cessation to prevent long-term complications and chronic disease.   Individualize activity and exercise recommendations while considering potential limitations, such as neuropathy, retinopathy or the ability to prevent hyperglycemia or hypoglycemia.   Prepare patient for use of pharmacologic therapy that may include antihypertensive, analgesic, prostaglandin E1 with periodic adjustments, based on presenting chronic condition and laboratory results.  Assess signs/symptoms and risk factors for hypertension, sleep-disordered breathing, neuropathy (including changes in gait and balance), retinopathy, nephropathy and sexual dysfunction.  Address pregnancy planning and contraceptive choice, especially when prescribing antihypertensive or statin.  Ensure completion of annual comprehensive foot exam and dilated eye exam.   Implement additional individualized goals and interventions based on identified risk factors.  Prepare patient for consultation or referral for specialist care, such as ophthalmology, neurology, cardiology, podiatry, nephrology or perinatology.   Notes:  [id number] Patient A1C has decreased from 9.2 to 8.9  Task: Monitor and Manage Follow-up for Comorbidities   Due Date: 02/25/2022  Note:   Care Management Activities:    - completion of annual foot exam verified - healthy lifestyle promoted - reduction of sedentary activity encouraged - signs/symptoms of comorbidities identified    Notes:       Goals Addressed             This Visit's Progress    (THN)Learn More About My Health   On track    Timeframe:  Short-Term Goal Priority:  Medium Start Date:  [id number]                 Expected End Date:  84536468          Follow Up Date 03212248   - make a list of questions - bring a list of my medicines to the visit - speak up when I don't understand    Why is this important?   The best way to learn about your  health and care is by talking to the doctor and nurse.  They will answer your questions and give you information in the way that you like best.    Notes:  [id number] RN provided A guide for your journey with diabetes booklet [id number] RN discussed the importance of exercise     [facility] and Keep All Appointments   On track    Timeframe:  Long-Range Goal Priority:  High Start Date:     50388828                        Expected End Date:  00349179 Follow Up Date 15056979   - arrange a ride through an agency 1 week before appointment - call to cancel if needed - keep a calendar with prescription refill dates - keep a calendar with appointment dates    Why is this important?   Part of staying healthy is seeing the doctor for follow-up care.  If you forget your appointments, there are some things you can do to stay on track.    Notes:  Per patient she had to cancel a rehab appointment due to the foot pain. [id number] RN encouraged patient to keep all appointments     (THN)Monitor and Manage My Blood Sugar   On track    Timeframe:  Long-Range Goal Priority:  High Start Date:       74827078                      Expected End Date:   67544920   - check blood sugar at prescribed times - check blood sugar if I feel it is too high or too low - enter blood sugar readings and medication or insulin into daily log - take the blood sugar log to all doctor visits - take the blood sugar meter to all doctor visits    Why is this important?   Checking your blood sugar at home helps to keep it from getting very high or very low.  Writing the results in a diary or log helps the doctor know how to care for you.  Your blood sugar log should have the time, date and the results.  Also, write down the amount of insulin or other medicine that you take.  Other information, like what you ate, exercise done and how you were feeling, will also be helpful.     Notes:  [id number] RN encouraged patient to  monitor her blood sugars, document and take her monitor to the Dr office     ([place] Exam   On track    Timeframe:  Long-Range Goal Priority:  Medium Start Date:  02233612                           Expected End Date:  24497530              Follow Up Date 05110211   - schedule appointment with eye doctor    Why is this important?   Eye check-ups are important when you have diabetes.  Vision loss can be prevented.    Notes:  Last eye exam  Eye exam [id number]      (THN)Perform Foot Care   On track    Timeframe:  Long-Range Goal Priority:  Medium Start Date: 41030131                            Expected End Date: 43888757                     Follow Up Date 97282060   - check feet daily for cuts, sores or redness - keep feet up while sitting - trim toenails straight across - wash and dry feet carefully every day - wear comfortable, cotton socks - wear comfortable, well-fitting shoes    Why is this important?   Good foot care is very important when you have diabetes.  There are many things you can do to keep your feet healthy and catch a problem early.    Notes:  Last podiatry appointment [id number] last podiatry visit [id number]     (THN)Set My Target A1C   On track    Timeframe:  Short-Term Goal Priority:  High Start Date:   34037096                          Expected End Date:   43838184          Follow Up Date 03754360   - set target A1C    Why is this important?   Your target A1C is decided together by you and your doctor.  It is based on several things like your age and other health issues.    Notes:  7.0  now A1C 8.3 A1C 7.1 decrease from 8.3 [id number] Patient A1C has decreased from 9.2 to 8.9        Plan:  Follow-up: Follow-up in 6 month(s) RN provided Guide for your journey with diabetes Patient will schedule [place] vaccine Patient will start an exercise routine Patient will monitor blood sugars as per ordered Patient will keep  scheduled appointments Patient will send update assessment to PCP  [facility] [phone]

## 2021-08-27 NOTE — Patient Instructions (Signed)
Goals Addressed             This Visit's Progress    (THN)Learn More About My Health   On track    Timeframe:  Short-Term Goal Priority:  Medium Start Date:  70962836                 Expected End Date:  62947654          Follow Up Date 65035465   - make a list of questions - bring a list of my medicines to the visit - speak up when I don't understand    Why is this important?   The best way to learn about your health and care is by talking to the doctor and nurse.  They will answer your questions and give you information in the way that you like best.    Notes:  [id number] RN provided A guide for your journey with diabetes booklet [id number] RN discussed the importance of exercise     [facility] and Keep All Appointments   On track    Timeframe:  Long-Range Goal Priority:  High Start Date:     96759163                        Expected End Date:  84665993 Follow Up Date 57017793   - arrange a ride through an agency 1 week before appointment - call to cancel if needed - keep a calendar with prescription refill dates - keep a calendar with appointment dates    Why is this important?   Part of staying healthy is seeing the doctor for follow-up care.  If you forget your appointments, there are some things you can do to stay on track.    Notes:  Per patient she had to cancel a rehab appointment due to the foot pain. [id number] RN encouraged patient to keep all appointments     (THN)Monitor and Manage My Blood Sugar   On track    Timeframe:  Long-Range Goal Priority:  High Start Date:       30076226                      Expected End Date:   33354562   - check blood sugar at prescribed times - check blood sugar if I feel it is too high or too low - enter blood sugar readings and medication or insulin into daily log - take the blood sugar log to all doctor visits - take the blood sugar meter to all doctor visits    Why is this important?   Checking your blood sugar at home  helps to keep it from getting very high or very low.  Writing the results in a diary or log helps the doctor know how to care for you.  Your blood sugar log should have the time, date and the results.  Also, write down the amount of insulin or other medicine that you take.  Other information, like what you ate, exercise done and how you were feeling, will also be helpful.     Notes:  [id number] RN encouraged patient to monitor her blood sugars, document and take her monitor to the Dr office     ([place] Exam   On track    Timeframe:  Long-Range Goal Priority:  Medium Start Date:  42876811  Expected End Date:  16109604              Follow Up Date 54098119   - schedule appointment with eye doctor    Why is this important?   Eye check-ups are important when you have diabetes.  Vision loss can be prevented.    Notes:  Last eye exam  Eye exam [id number]      (THN)Perform Foot Care   On track    Timeframe:  Long-Range Goal Priority:  Medium Start Date: 21308657                            Expected End Date: 84696295                     Follow Up Date 28413244   - check feet daily for cuts, sores or redness - keep feet up while sitting - trim toenails straight across - wash and dry feet carefully every day - wear comfortable, cotton socks - wear comfortable, well-fitting shoes    Why is this important?   Good foot care is very important when you have diabetes.  There are many things you can do to keep your feet healthy and catch a problem early.    Notes:  Last podiatry appointment [id number] last podiatry visit [id number]     (THN)Set My Target A1C   On track    Timeframe:  Short-Term Goal Priority:  High Start Date:   75643329                          Expected End Date:   51884166          Follow Up Date 06301601   - set target A1C    Why is this important?   Your target A1C is decided together by you and your doctor.  It is based  on several things like your age and other health issues.    Notes:  7.0  now A1C 8.3 A1C 7.1 decrease from 8.3 09323557 Patient A1C has decreased from 9.2 to 8.9

## 2021-09-03 ENCOUNTER — Other Ambulatory Visit: Payer: Self-pay | Admitting: Family Medicine

## 2021-09-07 NOTE — Progress Notes (Signed)
    SUBJECTIVE:   CHIEF COMPLAINT / HPI:     Diabetes Brings all her medications.  Has not taken metformin for 2 weeks.  Has noticed a marked decrease in diarrhea.  Does not want to start back.  Does not notice that Victoza causes much upset.  Is reluctantly willing to try insulin.  Her fasting blood sugar are in upper 200s to low 300s    Neuropathy Her feet seem better with the increased gabapentin 300 mg twice a day.  She does feel very sleepy often times during the day   OBJECTIVE:   BP 139/72   Pulse 79   Wt 210 lb 6.4 oz (95.4 kg)   SpO2 100%   BMI 38.48 kg/m   Good spirits, joking with me about visits and getting out of her way in hall Psych:  Cognition and judgment appear intact. Alert, communicative  and cooperative with normal attention span and concentration. No apparent delusions, illusions, hallucinations    ASSESSMENT/PLAN:   Neuropathy due to secondary diabetes ([place]) Improved with increased gabapentin but this causing her sleepiness.  Suggested trying to cut down her day time dose to see if can find a happy medium between pain and sleepiness  Type 2 diabetes mellitus with diabetic foot deformity (HCC) Worsened.  Unable to tolerate metformin.  Will start low dose lantus and slowly increase to get fastings to around 175.   Spoke with Dr Claiborne Billings who will see her in follow up.  Suggests changing victoza to a covered GLP1 for less sticks and perhaps better control and weight loss.       Lind Covert, MD [place]

## 2021-09-08 ENCOUNTER — Encounter: Payer: Self-pay | Admitting: Gastroenterology

## 2021-09-08 NOTE — Patient Instructions (Signed)
Good to see you today - Thank you for coming in  Things we discussed today:  Diabetes Lantus 10 u each morning increase by one unit every time fasting is greater than 175  Try lower the dose of gabapentin in the morning to be less sleepy  See Dr Claiborne Billings in 2 weeks  Please always bring your medication bottles  Come back to see me in 2 months

## 2021-09-09 ENCOUNTER — Other Ambulatory Visit: Payer: Self-pay

## 2021-09-09 ENCOUNTER — Ambulatory Visit (INDEPENDENT_AMBULATORY_CARE_PROVIDER_SITE_OTHER): Payer: Medicare Other | Admitting: Family Medicine

## 2021-09-09 ENCOUNTER — Ambulatory Visit (INDEPENDENT_AMBULATORY_CARE_PROVIDER_SITE_OTHER): Payer: Medicare Other

## 2021-09-09 ENCOUNTER — Encounter: Payer: Self-pay | Admitting: Family Medicine

## 2021-09-09 DIAGNOSIS — Z23 Encounter for immunization: Secondary | ICD-10-CM | POA: Diagnosis not present

## 2021-09-09 DIAGNOSIS — M21969 Unspecified acquired deformity of unspecified lower leg: Secondary | ICD-10-CM

## 2021-09-09 DIAGNOSIS — E1340 Other specified diabetes mellitus with diabetic neuropathy, unspecified: Secondary | ICD-10-CM

## 2021-09-09 DIAGNOSIS — E1169 Type 2 diabetes mellitus with other specified complication: Secondary | ICD-10-CM | POA: Diagnosis not present

## 2021-09-09 MED ORDER — INSULIN GLARGINE 100 UNIT/ML SOLOSTAR PEN: PEN_INJECTOR | SUBCUTANEOUS | 3 refills | Status: DC

## 2021-09-09 NOTE — Assessment & Plan Note (Signed)
Worsened.  Unable to tolerate metformin.  Will start low dose lantus and slowly increase to get fastings to around 175.   Spoke with Dr Claiborne Billings who will see her in follow up.  Suggests changing victoza to a covered GLP1 for less sticks and perhaps better control and weight loss.

## 2021-09-09 NOTE — Assessment & Plan Note (Signed)
Improved with increased gabapentin but this causing her sleepiness.  Suggested trying to cut down her day time dose to see if can find a happy medium between pain and sleepiness

## 2021-09-16 ENCOUNTER — Encounter: Payer: Self-pay | Admitting: Gastroenterology

## 2021-09-17 DIAGNOSIS — H538 Other visual disturbances: Secondary | ICD-10-CM | POA: Diagnosis not present

## 2021-09-17 DIAGNOSIS — E119 Type 2 diabetes mellitus without complications: Secondary | ICD-10-CM | POA: Diagnosis not present

## 2021-09-17 DIAGNOSIS — Z961 Presence of intraocular lens: Secondary | ICD-10-CM | POA: Diagnosis not present

## 2021-09-17 DIAGNOSIS — H4720 Unspecified optic atrophy: Secondary | ICD-10-CM | POA: Diagnosis not present

## 2021-09-24 ENCOUNTER — Ambulatory Visit (INDEPENDENT_AMBULATORY_CARE_PROVIDER_SITE_OTHER): Payer: Medicare Other | Admitting: Pharmacist

## 2021-09-24 ENCOUNTER — Other Ambulatory Visit: Payer: Self-pay

## 2021-09-24 DIAGNOSIS — M21969 Unspecified acquired deformity of unspecified lower leg: Secondary | ICD-10-CM

## 2021-09-24 DIAGNOSIS — E1169 Type 2 diabetes mellitus with other specified complication: Secondary | ICD-10-CM | POA: Diagnosis not present

## 2021-09-24 MED ORDER — OZEMPIC (0.25 OR 0.5 MG/DOSE) 2 MG/1.5ML ~~LOC~~ SOPN: 0.5000 mg | PEN_INJECTOR | SUBCUTANEOUS | 2 refills | Status: DC

## 2021-09-24 NOTE — Patient Instructions (Addendum)
Miss [place] it was a pleasure seeing you today.   Please do the following:  Increase your Lantus to 30 units once daily as directed today during your appointment. If you have any questions or if you believe something has occurred because of this change, call me or your doctor to let one of [place] know.  We are switching your Victoza to Ozempic. This will now be a once a week injection and you will dial the pen all the way to the end where you will see 0.5mg .  Continue checking blood sugars at home. It's really important that you record these and bring these in to your next doctor's appointment.  Continue making the lifestyle changes we've discussed together during our visit. Diet and exercise play a significant role in improving your blood sugars.  Follow-up with me on November 16th at 9:00 AM   Hypoglycemia or low blood sugar:   Low blood sugar can happen quickly and may become an emergency if not treated right away.   While this shouldn't happen often, it can be brought upon if you skip a meal or do not eat enough. Also, if your insulin or other diabetes medications are dosed too high, this can cause your blood sugar to go to low.   Warning signs of low blood sugar include: Feeling shaky or dizzy Feeling weak or tired  Excessive hunger Feeling anxious or upset  Sweating even when you aren't exercising  What to do if I experience low blood sugar? Follow the Rule of 15 Check your blood sugar with your meter. If lower than 70, proceed to step 2.  Treat with 15 grams of fast acting carbs which is found in 3-4 glucose tablets. If none are available you can try hard candy, 1 tablespoon of sugar or honey,4 ounces of fruit juice, or 6 ounces of REGULAR soda.  Re-check your sugar in 15 minutes. If it is still below 70, do what you did in step 2 again. If your blood sugar has come back up, go ahead and eat a snack or small meal made up of complex carbs (ex. Whole grains) and protein at this time to  avoid recurrence of low blood sugar.

## 2021-09-24 NOTE — Progress Notes (Signed)
Subjective:    Patient ID: ADALEI NOVELL, female    DOB: [birth year], 70 y.o.   MRN: [id number]  HPI Patient is a 70 y.o. female who presents for diabetes management. She is in good spirits and presents with assistance of walker Patient was referred and last seen by Primary Care Provider on 09/09/21  Patient reports diabetes was diagnosed around 20 years ago .   Insurance coverage/medication affordability: UHC Medicare + Medicaid  Current diabetes medications include: Lantus 26 units once daily, jardiance 25mg , Victoza 1.8mg  daily Current hypertension medications include: losartan 100mg , metoprolol succinate 25mg , spironolactone 25mg   Current hyperlipidemia medications include: atorvastatin 40mg  Patient states that She is taking her medications as prescribed. Patient reports adherence with medications. Patient states that She misses her medications 1 time per week, on average.  Do you feel that your medications are working for you?  "Not sure"  Have you been experiencing any side effects to the medications prescribed? no  Do you have any problems obtaining medications due to transportation or finances?  no     Patient reported dietary habits:  Eats 2 meals/day and 1 snacks/day Breakfast: skips Lunch: sausage, egg, toast, grits; sometimes oatmeal Dinner: vegetable, starch, meat Snacks: chocolate, potato chip Drinks: 1 regular soda/day (usually sprite), water, sweet tea, juice (orange or mixed fruit)   Patient denies hypoglycemic events. Patient denies polyuria (increased urination).  Patient reports polyphagia (increased appetite).  Patient reports polydipsia (increased thirst).  Patient reports neuropathy (nerve pain). Patient reports visual changes but just had eye exam and doctor had no concerns Patient reports self foot exams.   Home fasting blood sugars: 197, 178, 250, 195, 227, 241, 205, 244, 272, 268, 231, 298  Objective:   Labs:   Physical Exam Neurological:      Mental Status: She is alert and oriented to person, place, and time.    Review of Systems  Gastrointestinal:  Negative for nausea and vomiting.   Lab Results  Component Value Date   HGBA1C 8.9 (A) 08/12/2021   HGBA1C 9.2 (A) 05/06/2021   HGBA1C 7.1 (A) 01/27/2021    Lipid Panel     Component Value Date/Time   CHOL 97 (L) 01/27/2021 1008   TRIG 223 (H) 01/27/2021 1008   HDL 38 (L) 01/27/2021 1008   CHOLHDL 2.6 01/27/2021 1008   CHOLHDL 5.9 01/07/2016 1635   VLDL UNABLE TO CALCULATE IF TRIGLYCERIDE OVER 400 mg/dL 01/07/2016 1635   LDLCALC 25 01/27/2021 1008   LDLDIRECT 36 03/31/2016 1027    Assessment/Plan:   T2DM is not controlled likely due to sub-optimal diet. Patient's blood glucose readings continue to remain above goal despite self-titrations of insulin per [name]' instructions. Will switch patient from max dose Victoza to Ozempic 0.5mg  once weekly and titrate up as tolerated. Will also increase Lantus to 30 units once daily. Patient with history of intolerance to metformin, even at low dose of 500mg . Patient educated on purpose, proper use and potential adverse effects of Ozempic.  Following instruction patient verbalized understanding of treatment plan.    Increased dose of basal insulin Lantus to 30 units once daily  jardiance 25mg , Victoza 1.8mg  daily Discontinued GLP-1 Victoza 1.8mg  once daily and started Ozempic 0.5mg  once weekly Continued SGLT2-I jardiance 25mg  Extensively discussed pathophysiology of diabetes, dietary effects on blood sugar control, and recommended lifestyle interventions. Patient will adhere to dietary modifications of minimizing sugar/carb dense snacks and sugary drinks Counseled on s/sx of and management of hypoglycemia Next A1C anticipated December 2022.  Follow-up appointment 3 weeks to review sugar readings. Written patient instructions provided.  This appointment required 30 minutes of direct patient care.  Thank you for involving  pharmacy to assist in providing this patient's care.  Patient seen with Elyse Jarvis, PharmD Candidate.

## 2021-09-24 NOTE — Assessment & Plan Note (Signed)
T2DM is not controlled likely due to sub-optimal diet. Patient's blood glucose readings continue to remain above goal despite self-titrations of insulin per [name]' instructions. Will switch patient from max dose Victoza to Ozempic 0.5mg  once weekly and titrate up as tolerated. Will also increase Lantus to 30 units once daily. Patient with history of intolerance to metformin, even at low dose of 500mg . Patient educated on purpose, proper use and potential adverse effects of Ozempic.  Following instruction patient verbalized understanding of treatment plan.    1. Increased dose of basal insulin Lantus to 30 units once daily  2. jardiance 25mg , Victoza 1.8mg  daily 3. Discontinued GLP-1 Victoza 1.8mg  once daily and started Ozempic 0.5mg  once weekly 4. Continued SGLT2-I jardiance 25mg  5. Extensively discussed pathophysiology of diabetes, dietary effects on blood sugar control, and recommended lifestyle interventions. 6. Patient will adhere to dietary modifications of minimizing sugar/carb dense snacks and sugary drinks 7. Counseled on s/sx of and management of hypoglycemia 8. Next A1C anticipated December 2022.   Follow-up appointment 3 weeks to review sugar readings. Written patient instructions provided.

## 2021-09-27 ENCOUNTER — Other Ambulatory Visit: Payer: Self-pay | Admitting: Family Medicine

## 2021-10-01 ENCOUNTER — Other Ambulatory Visit: Payer: Self-pay | Admitting: Family Medicine

## 2021-10-14 ENCOUNTER — Other Ambulatory Visit: Payer: Self-pay

## 2021-10-14 ENCOUNTER — Ambulatory Visit (INDEPENDENT_AMBULATORY_CARE_PROVIDER_SITE_OTHER): Payer: Medicare Other | Admitting: Pharmacist

## 2021-10-14 DIAGNOSIS — E1169 Type 2 diabetes mellitus with other specified complication: Secondary | ICD-10-CM | POA: Diagnosis not present

## 2021-10-14 DIAGNOSIS — I10 Essential (primary) hypertension: Secondary | ICD-10-CM | POA: Diagnosis not present

## 2021-10-14 DIAGNOSIS — M21969 Unspecified acquired deformity of unspecified lower leg: Secondary | ICD-10-CM | POA: Diagnosis not present

## 2021-10-14 MED ORDER — OZEMPIC (1 MG/DOSE) 2 MG/1.5ML ~~LOC~~ SOPN: 1.0000 mg | PEN_INJECTOR | SUBCUTANEOUS | 2 refills | Status: DC

## 2021-10-14 NOTE — Patient Instructions (Addendum)
Miss Tirone it was a pleasure seeing you today.   Please do the following:  We are going to try to get you on the Ozempic 1mg  dose again. I will follow-up with [facility] and let you know. If you have any questions or if you believe something has occurred because of this change, call me or your doctor to let one of [place] know.  We are not making any changes to your medications today as we are working towards getting you the Ozempic. If you receive Ozempic in the mail just a reminder that this is a ONCE A WEEK injection and you will STOP VICTOZA Continue checking blood sugars at home. It's really important that you record these and bring these in to your next doctor's appointment.  Continue making the lifestyle changes we've discussed together during our visit. Diet and exercise play a significant role in improving your blood sugars.  Follow-up with me [name] in one month and me in two months Please bring your blood pressure device to your next office visit with [name]   Hypoglycemia or low blood sugar:   Low blood sugar can happen quickly and may become an emergency if not treated right away.   While this shouldn't happen often, it can be brought upon if you skip a meal or do not eat enough. Also, if your insulin or other diabetes medications are dosed too high, this can cause your blood sugar to go to low.   Warning signs of low blood sugar include: Feeling shaky or dizzy Feeling weak or tired  Excessive hunger Feeling anxious or upset  Sweating even when you aren't exercising  What to do if I experience low blood sugar? Follow the Rule of 15 Check your blood sugar with your meter. If lower than 70, proceed to step 2.  Treat with 15 grams of fast acting carbs which is found in 3-4 glucose tablets. If none are available you can try hard candy, 1 tablespoon of sugar or honey,4 ounces of fruit juice, or 6 ounces of REGULAR soda.  Re-check your sugar in 15 minutes. If it is still below  70, do what you did in step 2 again. If your blood sugar has come back up, go ahead and eat a snack or small meal made up of complex carbs (ex. Whole grains) and protein at this time to avoid recurrence of low blood sugar.

## 2021-10-14 NOTE — Assessment & Plan Note (Signed)
Hypertension borderline controlled, needs further observation, and needs improvement.  1. Follow up: 4 weeks with PCP. -Continued losartan 100mg , metoprolol succinate 25mg , spironolactone 25mg   -Recommended patient continue to check home blood pressure readings and bring to office visit with PCP alongside bringing in physical device for comparison with in office readings -Counseled on lifestyle modifications for blood pressure control including reduced dietary sodium, increased exercise, adequate sleep.

## 2021-10-14 NOTE — Progress Notes (Signed)
Subjective:    Patient ID: [name], female    DOB: 07/21/1951, 70 y.o.   MRN: [id number]  HPI Patient is a 70 y.o. female who presents for diabetes management. She is in good spirits and presents with assistance of walker Patient was referred and last seen by Primary Care Provider on 09/09/21. Last seen in pharmacy clinic on 09/24/21.  Patient reports diabetes was diagnosed around 20 years ago .   Insurance coverage/medication affordability: UHC Medicare + Medicaid  Current diabetes medications include: Lantus 30 units once daily, jardiance 25mg , Victoza 1.8mg  daily (unable to obtain Ozempic) Current hypertension medications include: losartan 100mg , metoprolol succinate 25mg , spironolactone 25mg   Current hyperlipidemia medications include: atorvastatin 40mg  Patient states that She is taking her medications as prescribed. Patient reports adherence with medications. Patient states that She misses her medications 1 time per week, on average.  Do you feel that your medications are working for you?  "Not sure"  Have you been experiencing any side effects to the medications prescribed? no  Do you have any problems obtaining medications due to transportation or finances?  Yes; Ozempic, pharmacy sent letter in mail to patient stating they needed more information    Patient reported dietary habits:  Eats 2 meals/day and 2 snacks/day Breakfast: skips Lunch: sausage, egg, toast, grits; oatmeal packets Dinner: vegetable, starch, meat Snacks: chocolate; potato chips; trail mix; fruit Drinks: water, sweet tea; juice (orange or mixed fruit); (has not bought any soda since last visit)   Patient denies hypoglycemic events. Patient reports polyuria (increased urination).  Patient reports polyphagia (increased appetite).  Patient reports polydipsia (increased thirst).  Patient reports neuropathy (nerve pain). Patient reports visual changes but just had eye exam and doctor had no  concerns Patient reports self foot exams.   Objective:   Home fasting blood sugars: 155, 178, 119, 184, 211, 179, 193, 199, 182, 196, 216, 189, 174, 185  Home blood pressure reading: 133/79 (this AM)  Labs:   Physical Exam Neurological:     Mental Status: She is alert and oriented to person, place, and time.    Review of Systems  Gastrointestinal:  Negative for nausea and vomiting.   Lab Results  Component Value Date   HGBA1C 8.9 (A) 08/12/2021   HGBA1C 9.2 (A) 05/06/2021   HGBA1C 7.1 (A) 01/27/2021    Lipid Panel     Component Value Date/Time   CHOL 97 (L) 01/27/2021 1008   TRIG 223 (H) 01/27/2021 1008   HDL 38 (L) 01/27/2021 1008   CHOLHDL 2.6 01/27/2021 1008   CHOLHDL 5.9 01/07/2016 1635   VLDL UNABLE TO CALCULATE IF TRIGLYCERIDE OVER 400 mg/dL 01/07/2016 1635   LDLCALC 25 01/27/2021 1008   LDLDIRECT 36 03/31/2016 1027    Assessment/Plan:   T2DM is not controlled likely due to sub-optimal diet, however, has improved slightly since increase in Lantus dose. Will re-send prescription for Ozempic. Will send in for 1mg  dose as this would be more equivalent to a step up in Victoza therapy, which patient has tolerated for quite some time. Will contact pharmacy to verify patient is able to obtain. Will hold off on increase in Lantus with potential for better glucose control with Ozempic switch. If unable to obtain Ozempic will then consider alternatives, including further increases in insulin therapy. Patient with history of intolerance to metformin, even at low dose of 500mg . Patient re-educated on purpose, proper use and potential adverse effects of Ozempic.  Following instruction patient verbalized understanding of treatment plan.  Continued basal insulin Lantus to 30 units once daily  Discontinued GLP-1 Victoza 1.8mg  once daily and started Ozempic 1 mg once weekly Continued SGLT2-I jardiance 25mg  Extensively discussed pathophysiology of diabetes, dietary effects on  blood sugar control, and recommended lifestyle interventions. Patient will adhere to dietary modifications of minimizing sugar/carb dense snacks and sugary drinks Counseled on s/sx of and management of hypoglycemia Next A1C anticipated December 2022.   Hypertension borderline controlled, needs further observation, and needs improvement.  Follow up: 4 weeks with PCP. -Continued losartan 100mg , metoprolol succinate 25mg , spironolactone 25mg   -Recommended patient continue to check home blood pressure readings and bring to office visit with PCP alongside bringing in physical device for comparison with in office readings -Counseled on lifestyle modifications for blood pressure control including reduced dietary sodium, increased exercise, adequate sleep.  Follow-up appointment 8 weeks to review sugar readings. Written patient instructions provided.  This appointment required 30 minutes of direct patient care.  Thank you for involving pharmacy to assist in providing this patient's care.

## 2021-10-14 NOTE — Assessment & Plan Note (Signed)
T2DM is not controlled likely due to sub-optimal diet, however, has improved slightly since increase in Lantus dose. Will re-send prescription for Ozempic. Will send in for 1mg  dose as this would be more equivalent to a step up in Victoza therapy, which patient has tolerated for quite some time. Will contact pharmacy to verify patient is able to obtain. Will hold off on increase in Lantus with potential for better glucose control with Ozempic switch. If unable to obtain Ozempic will then consider alternatives, including further increases in insulin therapy. Patient with history of intolerance to metformin, even at low dose of 500mg . Patient re-educated on purpose, proper use and potential adverse effects of Ozempic.  Following instruction patient verbalized understanding of treatment plan.    1. Continued basal insulin Lantus to 30 units once daily  2. Discontinued GLP-1 Victoza 1.8mg  once daily and started Ozempic 1 mg once weekly 3. Continued SGLT2-I jardiance 25mg  4. Extensively discussed pathophysiology of diabetes, dietary effects on blood sugar control, and recommended lifestyle interventions. 5. Patient will adhere to dietary modifications of minimizing sugar/carb dense snacks and sugary drinks 6. Counseled on s/sx of and management of hypoglycemia 7. Next A1C anticipated December 2022.

## 2021-10-15 ENCOUNTER — Other Ambulatory Visit: Payer: Self-pay | Admitting: Family Medicine

## 2021-10-15 DIAGNOSIS — E785 Hyperlipidemia, unspecified: Secondary | ICD-10-CM

## 2021-10-20 ENCOUNTER — Ambulatory Visit (AMBULATORY_SURGERY_CENTER): Payer: Self-pay

## 2021-10-20 ENCOUNTER — Other Ambulatory Visit: Payer: Self-pay

## 2021-10-20 VITALS — Ht 62.0 in | Wt 215.0 lb

## 2021-10-20 DIAGNOSIS — Z1211 Encounter for screening for malignant neoplasm of colon: Secondary | ICD-10-CM

## 2021-10-20 MED ORDER — NA SULFATE-K SULFATE-MG SULF 17.5-3.13-1.6 GM/177ML PO SOLN
1.0000 | Freq: Once | ORAL | 0 refills | Status: AC
Start: 2021-10-20 — End: 2021-10-20

## 2021-10-20 NOTE — Progress Notes (Signed)
Denies allergies to eggs or soy products. Denies complication of anesthesia or sedation. Denies use of weight loss medication. Denies use of O2.   Emmi instructions given for colonoscopy.  

## 2021-10-21 ENCOUNTER — Telehealth: Payer: Self-pay | Admitting: Gastroenterology

## 2021-10-21 NOTE — Telephone Encounter (Signed)
Spoke with pt- note will be faxed to [phone] that her daughter will need to be with her that day, including during travel.

## 2021-10-21 NOTE — Telephone Encounter (Signed)
Inbound call from patient. States she is taking a transportation service for her procedure 12/6 and they need a form faxed to them stating she need a care partner to ride with her. She did not have the transportation information on hand at the time.

## 2021-10-21 NOTE — Telephone Encounter (Signed)
Patient is returning the call asking for a call back.

## 2021-10-28 ENCOUNTER — Other Ambulatory Visit: Payer: Self-pay

## 2021-10-28 ENCOUNTER — Ambulatory Visit (INDEPENDENT_AMBULATORY_CARE_PROVIDER_SITE_OTHER): Payer: Medicare Other | Admitting: Podiatry

## 2021-10-28 ENCOUNTER — Encounter: Payer: Self-pay | Admitting: Podiatry

## 2021-10-28 DIAGNOSIS — B351 Tinea unguium: Secondary | ICD-10-CM

## 2021-10-28 DIAGNOSIS — M2010 Hallux valgus (acquired), unspecified foot: Secondary | ICD-10-CM

## 2021-10-28 DIAGNOSIS — E1140 Type 2 diabetes mellitus with diabetic neuropathy, unspecified: Secondary | ICD-10-CM

## 2021-10-28 NOTE — Progress Notes (Signed)
This patient returns to my office for at risk foot care.  This patient requires this care by a professional since this patient will be at risk due to having diabetes.   This patient is unable to cut nails herself since the patient cannot reach her nails.These nails are painful walking and wearing shoes.  This patient presents for at risk foot care today.  General Appearance  Alert, conversant and in no acute stress.  Vascular  Dorsalis pedis and posterior tibial  pulses are palpable  bilaterally.  Capillary return is within normal limits  bilaterally. Temperature is within normal limits  bilaterally.  Neurologic  Senn-Weinstein monofilament wire test absent   bilaterally. Muscle power within normal limits bilaterally.  Nails Thick disfigured discolored nails with subungual debris  from hallux to fifth toes bilaterally. No evidence of bacterial infection or drainage bilaterally.  Orthopedic  No limitations of motion  feet .  No crepitus or effusions noted.  No bony pathology or digital deformities noted. Asymptomatic  HAV  B/L.  Pes planus  B/L. Hammer toes second left foot.  Skin  normotropic skin with no porokeratosis noted bilaterally.  No signs of infections or ulcers noted.     Onychomycosis  Pain in right toes  Pain in left toes    Consent was obtained for treatment procedures.   Mechanical debridement of nails 1-5  bilaterally performed with a nail nipper.  Filed with dremel without incident. Asks to see Rick.   Return office visit  3 months                    Told patient to return for periodic foot care and evaluation due to potential at risk complications   Laurens Matheny DPM  

## 2021-10-30 ENCOUNTER — Other Ambulatory Visit: Payer: Self-pay

## 2021-10-30 MED ORDER — INSULIN GLARGINE 100 UNIT/ML SOLOSTAR PEN: PEN_INJECTOR | SUBCUTANEOUS | 1 refills | Status: DC

## 2021-11-02 ENCOUNTER — Other Ambulatory Visit: Payer: Self-pay | Admitting: Family Medicine

## 2021-11-02 DIAGNOSIS — E1169 Type 2 diabetes mellitus with other specified complication: Secondary | ICD-10-CM

## 2021-11-03 ENCOUNTER — Ambulatory Visit (AMBULATORY_SURGERY_CENTER): Payer: Medicare Other | Admitting: Gastroenterology

## 2021-11-03 ENCOUNTER — Other Ambulatory Visit: Payer: Self-pay

## 2021-11-03 ENCOUNTER — Encounter: Payer: Self-pay | Admitting: Gastroenterology

## 2021-11-03 ENCOUNTER — Encounter: Payer: Medicare Other | Admitting: Gastroenterology

## 2021-11-03 VITALS — BP 160/72 | HR 70 | Temp 95.3°F | Resp 16 | Ht 62.0 in | Wt 215.0 lb

## 2021-11-03 DIAGNOSIS — D120 Benign neoplasm of cecum: Secondary | ICD-10-CM | POA: Diagnosis not present

## 2021-11-03 DIAGNOSIS — D123 Benign neoplasm of transverse colon: Secondary | ICD-10-CM

## 2021-11-03 DIAGNOSIS — D124 Benign neoplasm of descending colon: Secondary | ICD-10-CM

## 2021-11-03 DIAGNOSIS — D122 Benign neoplasm of ascending colon: Secondary | ICD-10-CM

## 2021-11-03 DIAGNOSIS — Z1211 Encounter for screening for malignant neoplasm of colon: Secondary | ICD-10-CM | POA: Diagnosis not present

## 2021-11-03 DIAGNOSIS — D125 Benign neoplasm of sigmoid colon: Secondary | ICD-10-CM

## 2021-11-03 MED ORDER — SODIUM CHLORIDE 0.9 % IV SOLN: 500.0000 mL | Freq: Once | INTRAVENOUS | Status: DC

## 2021-11-03 NOTE — Progress Notes (Signed)
HPI: This is a woman at routine risk for CRC   ROS: complete GI ROS as described in HPI, all other review negative.  Constitutional:  No unintentional weight loss   Past Medical History:  Diagnosis Date   Anemia    Arthritis    Asthma    Blood transfusion without reported diagnosis    Patients believes 2015 when she overdosed on "medications"    Breast cancer ([place])    Breast cancer of lower-outer quadrant of right female breast ([place]) 02/13/2015   Cataract    CHF, acute ([place]) 05/03/2012   Depression    Diabetes mellitus    Eczema    Fatty liver    Fatty infiltration of liver noted on 03/2012 CT scan   Fibromyalgia    Glaucoma    Heart disease    History of kidney stones    w/ hx of hydronephrosis - followed by [facility]   HIV nonspecific serology    2006: indeterminate HIV blood test, seen by ID, felt secondary to cross reacting antibodies with no further workup felt necessary at that time    Hyperlipidemia    Hypertension    Obesity    Personal history of radiation therapy 2016   right   Radiation 05/07/15-06/23/15   Right Breast    Past Surgical History:  Procedure Laterality Date   BREAST BIOPSY Left 02/10/2015   malignant    BREAST LUMPECTOMY Right 03/21/2015   CHOLECYSTECTOMY  2003   CYSTOSCOPY W/ LITHOLAPAXY / [facility]     JOINT REPLACEMENT     bilateral knee replacement   LEFT AND RIGHT HEART CATHETERIZATION WITH CORONARY ANGIOGRAM N/A 05/02/2012   Procedure: LEFT AND RIGHT HEART CATHETERIZATION WITH CORONARY ANGIOGRAM;  Surgeon: Burnell Blanks, MD;  Location: [facility] CATH LAB;  Service: Cardiovascular;  Laterality: N/A;   RADIOACTIVE SEED GUIDED PARTIAL MASTECTOMY WITH AXILLARY SENTINEL LYMPH NODE BIOPSY Right 03/21/2015   Procedure: RIGHT  PARTIAL MASTECTOMY WITH RADIOACTIVE SEED LOCALIZATION RIGHT  AXILLARY SENTINEL LYMPH NODE BIOPSY;  Surgeon: Fanny Skates, MD;  Location: [place];  Service: General;  Laterality: Right;   REPLACEMENT  TOTAL KNEE BILATERAL  2005 &2006   VASCULAR SURGERY Right 03/15/2013   Ultrasound guided sclerotherapy    Current Outpatient Medications  Medication Sig Dispense Refill   acetaminophen (TYLENOL) 650 MG CR tablet Take 650 mg by mouth every 8 (eight) hours as needed for pain.     allopurinol (ZYLOPRIM) 100 MG tablet TAKE 1 TABLET BY MOUTH  DAILY 90 tablet 3   atorvastatin (LIPITOR) 40 MG tablet TAKE 1 TABLET BY MOUTH  DAILY 90 tablet 3   BD AUTOSHIELD DUO 30G X 5 MM MISC USE ONCE DAILY TO INJECT  INSULIN AS DIRECTED 90 each 3   Blood Glucose Monitoring Suppl (ONETOUCH VERIO FLEX SYSTEM) w/Device KIT USE AS DIRECTED 1 kit 0   colchicine 0.6 MG tablet TAKE 1 TABLET BY MOUTH  DAILY 90 tablet 1   dorzolamide-timolol (COSOPT) 22.3-6.8 MG/ML ophthalmic solution Place 1 drop into both eyes 2 (two) times daily.  0   EASY COMFORT PEN NEEDLES 31G X 8 MM MISC USE TO INJECT INSULIN EVERY DAY AS DIRECTED 100 each 1   FLOVENT HFA 110 MCG/ACT inhaler USE 2 INHALATIONS BY MOUTH  TWICE DAILY 36 g 3   gabapentin (NEURONTIN) 100 MG capsule TAKE 3 CAPSULES BY MOUTH  TWICE DAILY AS NEEDED 360 capsule 3   insulin glargine (LANTUS) 100 UNIT/ML Solostar Pen Inject 10 units  each morning.  Increase by one unit each morning that your blood sugar is greater than 175 3 mL 1   JARDIANCE 25 MG TABS tablet TAKE 1 TABLET BY MOUTH  DAILY 90 tablet 3   Lancet Devices (ONETOUCH DELICA PLUS LANCING) MISC USE ONCE DAILY 100 each 0   latanoprost (XALATAN) 0.005 % ophthalmic solution Place 1 drop into both eyes at bedtime.   0   losartan (COZAAR) 100 MG tablet TAKE 1 TABLET BY MOUTH AT  BEDTIME 90 tablet 3   metoprolol succinate (TOPROL-XL) 25 MG 24 hr tablet TAKE 1 TABLET BY MOUTH  DAILY 90 tablet 3   Miconazole Nitrate (LOTRIMIN AF) 2 % AERO Spray between toes once daily for 4 weeks. 150 g 1   OneTouch Delica Lancets 49F MISC 1 Container by Does not apply route daily as needed. 100 each prn   ONETOUCH VERIO test strip CHECK BLOOD  SUGAR IN THE  MORNING AS DIRECTED 100 strip 3   OZEMPIC, 1 MG/DOSE, 4 MG/3ML SOPN INJECT SUBCUTANEOUSLY 1 MG EVERY WEEK 3 mL 11   spironolactone (ALDACTONE) 25 MG tablet TAKE 1 TABLET BY MOUTH  DAILY 90 tablet 3   dorzolamide (TRUSOPT) 2 % ophthalmic solution 1 drop daily.     Current Facility-Administered Medications  Medication Dose Route Frequency Provider Last Rate Last Admin   0.9 %  sodium chloride infusion  500 mL Intravenous Once Milus Banister, MD        Allergies as of 11/03/2021   (No Known Allergies)    Family History  Problem Relation Age of Onset   Diabetes Mother    Stroke Mother    Heart disease Father    Anemia Father    Esophageal cancer Sister    Cancer Sister 48       nose cancer    Diabetes Sister        2 sisters type 1 diabetes    Kidney disease Sister        waiting for a kidney transplant- on dialysis   Diabetes Brother        Type 2 diabetic    Diabetes Son        Type 2    Hypertension Son    Cancer Cousin 20       ovarian cancer    Cancer Cousin 28       ovarian cancer    Colon cancer Neg Hx    Rectal cancer Neg Hx    Stomach cancer Neg Hx     Social History   Socioeconomic History   Marital status: Widowed    Spouse name: [name]   Number of children: 3   Years of education: 14   Highest education level: Associate degree: occupational, Hotel manager, or vocational program  Occupational History   Occupation: retired-CNA, Microbiologist: RETIRED   Occupation: Psychologist, sport and exercise  Tobacco Use   Smoking status: Never   Smokeless tobacco: Never  Vaping Use   Vaping Use: Never used  Substance and Sexual Activity   Alcohol use: No    Alcohol/week: 0.0 standard drinks   Drug use: No   Sexual activity: Not Currently    Birth control/protection: Post-menopausal  Other Topics Concern   Not on file  Social History Narrative   Emergency Contact: son, Mckennah Kretchmer [phone]   Patient lives is single story home with  daughter, [name] and grandddaughter, [name]. Ramp to front door, 3 steps in back. Home has smoke  detectors, no throw rugs. No grab bars in bathroom. No pets.   Diet: Pt has a varied diet.  Reports eating 2 meals a day.    Exercise: walks daily 15-20 mins; not as much due to possible irritable bowel; does exercise at church once weekly. Serves on [employer] at [employer] and as a Chief Executive Officer at [employer].   Seatbelts: Pt reports wearing seatbelt when in vehicles. Does not drive.   Hobbies: word searches, church, time with family and friends, cooking, walking when she can; going out shopping.   Drinks occasional soda; drinks a lot of water and some kool-aid; does drink tea      *Update as of 04/19/2019*   Patient has not able to walk around her neighborhood, or down her street, due to [place]. Patient is eager to get back to this once she feels "safe" to leave her house.    Social Determinants of Health   Financial Resource Strain: Not on file  Food Insecurity: No Food Insecurity   Worried About Charity fundraiser in the Last Year: Never true   Ran Out of Food in the Last Year: Never true  Transportation Needs: No Transportation Needs   Lack of Transportation (Medical): No   Lack of Transportation (Non-Medical): No  Physical Activity: Not on file  Stress: Not on file  Social Connections: Not on file  Intimate Partner Violence: Not on file     Physical Exam: BP (!) 164/95   Pulse 69   Temp (!) 95.3 F (35.2 C)   Resp (!) 23   Ht 5' 2" (1.575 m)   Wt 215 lb (97.5 kg)   SpO2 100%   BMI 39.32 kg/m  Constitutional: generally well-appearing Psychiatric: alert and oriented x3 Lungs: CTA bilaterally Heart: no MCR  Assessment and plan: 70 y.o. female with routine risk for CRC  Colonoscioyp today.  Care is appropriate for the ambulatory setting.  Owens Loffler, MD [facility] 11/03/2021, 11:03 AM

## 2021-11-03 NOTE — Progress Notes (Signed)
Called to room to assist during endoscopic procedure.  Patient ID and intended procedure confirmed with present staff. Received instructions for my participation in the procedure from the performing physician.  

## 2021-11-03 NOTE — Op Note (Signed)
[facility] Patient Name: [name] Procedure Date: 11/03/2021 10:55 AM MRN: [id number] Endoscopist: Milus Banister , MD Age: 70 Referring MD:  Date of Birth: 01/12/1951 Gender: Female Account #: [id number] Procedure:                Colonoscopy Indications:              Screening for colorectal malignant neoplasm Medicines:                Monitored Anesthesia Care Procedure:                Pre-Anesthesia Assessment:                           - Prior to the procedure, a History and Physical                            was performed, and patient medications and                            allergies were reviewed. The patient's tolerance of                            previous anesthesia was also reviewed. The risks                            and benefits of the procedure and the sedation                            options and risks were discussed with the patient.                            All questions were answered, and informed consent                            was obtained. Prior Anticoagulants: The patient has                            taken no previous anticoagulant or antiplatelet                            agents. ASA Grade Assessment: III - A patient with                            severe systemic disease. After reviewing the risks                            and benefits, the patient was deemed in                            satisfactory condition to undergo the procedure.                           After obtaining informed consent, the colonoscope  was passed under direct vision. Throughout the                            procedure, the patient's blood pressure, pulse, and                            oxygen saturations were monitored continuously. The                            Olympus CF-HQ190L [id number]) Colonoscope was                            introduced through the anus and advanced to the the                            cecum,  identified by appendiceal orifice and                            ileocecal valve. The colonoscopy was performed                            without difficulty. The patient tolerated the                            procedure well. The quality of the bowel                            preparation was good. The ileocecal valve,                            appendiceal orifice, and rectum were photographed. Scope In: 11:10:31 AM Scope Out: 11:25:45 AM Scope Withdrawal Time: 0 hours 13 minutes 24 seconds  Total Procedure Duration: 0 hours 15 minutes 14 seconds  Findings:                 Five sessile polyps were found in the transverse                            colon, ascending colon and cecum. The polyps were 3                            to 9 mm in size. These polyps were removed with a                            cold snare. Resection and retrieval were complete.                            jar 1                           A 10 mm polyp was found in the descending colon.                            The polyp was semi-pedunculated. The  polyp was                            removed with a cold snare. Resection and retrieval                            were complete. jar 2                           A 14 mm polyp was found in the proximal sigmoid                            colon. The polyp was pedunculated. The polyp was                            removed with a hot snare. Resection and retrieval                            were complete. jar 3                           Multiple small and large-mouthed diverticula were                            found in the left colon.                           External and internal hemorrhoids were found. The                            hemorrhoids were small.                           The exam was otherwise without abnormality. Complications:            No immediate complications. Estimated blood loss:                            None. Estimated Blood Loss:     Estimated  blood loss: none. Impression:               - Five 3 to 9 mm polyps in the transverse colon, in                            the ascending colon and in the cecum, removed with                            a cold snare. Resected and retrieved.                           - One 10 mm polyp in the descending colon, removed                            with a cold snare. Resected and retrieved.                           -  One 14 mm polyp in the proximal sigmoid colon,                            removed with a hot snare. Resected and retrieved.                           - Diverticulosis in the left colon.                           - External and internal hemorrhoids.                           - The examination was otherwise normal. Recommendation:           - Patient has a contact number available for                            emergencies. The signs and symptoms of potential                            delayed complications were discussed with the                            patient. Return to normal activities tomorrow.                            Written discharge instructions were provided to the                            patient.                           - Resume previous diet.                           - Continue present medications.                           - Await pathology results. Milus Banister, MD 11/03/2021 11:30:22 AM This report has been signed electronically.

## 2021-11-03 NOTE — Patient Instructions (Signed)
Thank you for allowing [place] to care for you today Await pathology results, approximately 7-10 days. Resume previous diet and medications today. Return to normal daily activities tomorrow.      YOU HAD AN ENDOSCOPIC PROCEDURE TODAY AT [facility]:   Refer to the procedure report that was given to you for any specific questions about what was found during the examination.  If the procedure report does not answer your questions, please call your gastroenterologist to clarify.  If you requested that your care partner not be given the details of your procedure findings, then the procedure report has been included in a sealed envelope for you to review at your convenience later.  YOU SHOULD EXPECT: Some feelings of bloating in the abdomen. Passage of more gas than usual.  Walking can help get rid of the air that was put into your GI tract during the procedure and reduce the bloating. If you had a lower endoscopy (such as a colonoscopy or flexible sigmoidoscopy) you may notice spotting of blood in your stool or on the toilet paper. If you underwent a bowel prep for your procedure, you may not have a normal bowel movement for a few days.  Please Note:  You might notice some irritation and congestion in your nose or some drainage.  This is from the oxygen used during your procedure.  There is no need for concern and it should clear up in a day or so.  SYMPTOMS TO REPORT IMMEDIATELY:  Following lower endoscopy (colonoscopy or flexible sigmoidoscopy):  Excessive amounts of blood in the stool  Significant tenderness or worsening of abdominal pains  Swelling of the abdomen that is new, acute  Fever of 100F or higher    For urgent or emergent issues, a gastroenterologist can be reached at any hour by calling [phone]. Do not use MyChart messaging for urgent concerns.    DIET:  We do recommend a small meal at first, but then you may proceed to your regular diet.  Drink plenty of  fluids but you should avoid alcoholic beverages for 24 hours.  ACTIVITY:  You should plan to take it easy for the rest of today and you should NOT DRIVE or use heavy machinery until tomorrow (because of the sedation medicines used during the test).    FOLLOW UP: Our staff will call the number listed on your records 48-72 hours following your procedure to check on you and address any questions or concerns that you may have regarding the information given to you following your procedure. If we do not reach you, we will leave a message.  We will attempt to reach you two times.  During this call, we will ask if you have developed any symptoms of COVID 19. If you develop any symptoms (ie: fever, flu-like symptoms, shortness of breath, cough etc.) before then, please call [phone].  If you test positive for Covid 19 in the 2 weeks post procedure, please call and report this information to [place].    If any biopsies were taken you will be contacted by phone or by letter within the next 1-3 weeks.  Please call us at [phone] if you have not heard about the biopsies in 3 weeks.    SIGNATURES/CONFIDENTIALITY: You and/or your care partner have signed paperwork which will be entered into your electronic medical record.  These signatures attest to the fact that that the information above on your After Visit Summary has been reviewed and is understood.  Full responsibility of the confidentiality of this discharge information lies with you and/or your care-partner.  

## 2021-11-03 NOTE — Progress Notes (Signed)
Report given to PACU, vss 

## 2021-11-03 NOTE — Progress Notes (Signed)
Transportation notified at 11:33 , advised patient would be ready in 15-20 minutes.

## 2021-11-05 ENCOUNTER — Telehealth: Payer: Self-pay | Admitting: *Deleted

## 2021-11-05 NOTE — Telephone Encounter (Signed)
  Follow up Call-  Call back number 11/03/2021  Post procedure Call Back phone  # [phone]  Permission to leave phone message Yes  Some recent data might be hidden     Patient questions:  Do you have a fever, pain , or abdominal swelling? No. Pain Score  0 *  Have you tolerated food without any problems? Yes.    Have you been able to return to your normal activities? Yes.    Do you have any questions about your discharge instructions: Diet   No. Medications  No. Follow up visit  No.  Do you have questions or concerns about your Care? No.  Actions: * If pain score is 4 or above: No action needed, pain <4.  Have you developed a fever since your procedure? no  2.   Have you had an respiratory symptoms (SOB or cough) since your procedure? no  3.   Have you tested positive for COVID 19 since your procedure no  4.   Have you had any family members/close contacts diagnosed with the COVID 19 since your procedure?  no   If yes to any of these questions please route to Joylene John, RN and Joella Prince, RN

## 2021-11-06 ENCOUNTER — Encounter: Payer: Self-pay | Admitting: Gastroenterology

## 2021-11-11 ENCOUNTER — Ambulatory Visit: Payer: Medicare Other | Admitting: Pharmacist

## 2021-11-11 ENCOUNTER — Encounter: Payer: Self-pay | Admitting: Family Medicine

## 2021-11-11 ENCOUNTER — Other Ambulatory Visit: Payer: Self-pay

## 2021-11-11 ENCOUNTER — Ambulatory Visit (INDEPENDENT_AMBULATORY_CARE_PROVIDER_SITE_OTHER): Payer: Medicare Other | Admitting: Family Medicine

## 2021-11-11 VITALS — BP 148/89 | HR 84 | Wt 217.8 lb

## 2021-11-11 DIAGNOSIS — N6323 Unspecified lump in the left breast, lower outer quadrant: Secondary | ICD-10-CM | POA: Diagnosis not present

## 2021-11-11 DIAGNOSIS — E1169 Type 2 diabetes mellitus with other specified complication: Secondary | ICD-10-CM | POA: Diagnosis not present

## 2021-11-11 DIAGNOSIS — I10 Essential (primary) hypertension: Secondary | ICD-10-CM

## 2021-11-11 DIAGNOSIS — M21969 Unspecified acquired deformity of unspecified lower leg: Secondary | ICD-10-CM

## 2021-11-11 DIAGNOSIS — N6320 Unspecified lump in the left breast, unspecified quadrant: Secondary | ICD-10-CM | POA: Insufficient documentation

## 2021-11-11 LAB — POCT GLYCOSYLATED HEMOGLOBIN (HGB A1C): HbA1c, POC (controlled diabetic range): 8.6 % — AB (ref 0.0–7.0)

## 2021-11-11 MED ORDER — INSULIN GLARGINE 100 UNIT/ML SOLOSTAR PEN: PEN_INJECTOR | SUBCUTANEOUS | 3 refills | Status: DC

## 2021-11-11 NOTE — Assessment & Plan Note (Signed)
Slightly improved.  Will increase insulin slightly as has been on GLP1 for only 2 weeks.  Follow up with Pharmacy

## 2021-11-11 NOTE — Patient Instructions (Signed)
Good to see you today - Thank you for coming in  Things we discussed today:   Breast lump - referred you to the breast center.  They should call you in the next 2 weeks if not let me know  Diabetes -  Lantus - go up to 32 u every morning Call if any low blood sugar  BP  Keep checking call me if regularly > 145/90   Please always bring your medication bottles  Come back to see me in 3 months  See Rachelle in 6 weeks   Happy Holidays

## 2021-11-11 NOTE — Progress Notes (Signed)
° ° °  SUBJECTIVE:   CHIEF COMPLAINT / HPI:   Breast Lump  Noticed a lump under her L breast about a week ago.  Nontender.  No discharge no other masses  Diabetes Recently started Ozempic with pharmacy.  No problems taking this.  Brings in her blood sugar and blood pressure logs. Over last week all blood sugar less than 200 most in 180s. No lows  Hypertension Measuring at home 130-140s/80s.  Forgot to bring her home cuff.  No edema or lightheadness    PERTINENT  PMH / PSH: Uses a walker.  Has history of breast cancer in R breast  OBJECTIVE:   BP (!) 148/89    Pulse 84    Wt 217 lb 12.8 oz (98.8 kg)    SpO2 100%    BMI 39.84 kg/m   L Breast - 3 cm firm mass at 6 oclock at outer edge of breast.  No other masses or axillary adenopatly Heart - Regular rate and rhythm.  No murmurs, gallops or rubs.    Lungs:  Normal respiratory effort, chest expands symmetrically. Lungs are clear to auscultation, no crackles or wheezes. No pedal edema   ASSESSMENT/PLAN:   Essential hypertension BP Readings from Last 3 Encounters:  11/11/21 (!) 148/89  11/03/21 (!) 160/72  10/14/21 (!) 144/79   Elevated in office.  Home readings show better control.  Asked her to bring in her home meter.  Has gout so thiazide not a good option.  Could increase spironolactone but will need to watch her K.  Possibly increase metoprolol   Left breast mass Firm mass,  Has history of R breast cancer.  Will refer to breast center for work up and possible bx   Type 2 diabetes mellitus with diabetic foot deformity ([place]) Slightly improved.  Will increase insulin slightly as has been on GLP1 for only 2 weeks.  Follow up with [place], MD [place]

## 2021-11-11 NOTE — Assessment & Plan Note (Signed)
Firm mass,  Has history of R breast cancer.  Will refer to breast center for work up and possible bx

## 2021-11-11 NOTE — Assessment & Plan Note (Signed)
BP Readings from Last 3 Encounters:  11/11/21 (!) 148/89  11/03/21 (!) 160/72  10/14/21 (!) 144/79   Elevated in office.  Home readings show better control.  Asked her to bring in her home meter.  Has gout so thiazide not a good option.  Could increase spironolactone but will need to watch her K.  Possibly increase metoprolol

## 2021-11-13 ENCOUNTER — Telehealth: Payer: Self-pay | Admitting: Hematology

## 2021-11-13 NOTE — Telephone Encounter (Signed)
Scheduled per sch msg. Called and spoke with patient. Confirmed appt  

## 2021-11-16 NOTE — Progress Notes (Deleted)
[facility] OFFICE PROGRESS NOTE  Lind Covert, MD [place] [address]  DIAGNOSIS: ***  PRIOR THERAPY:  CURRENT THERAPY:  INTERVAL HISTORY: [name] 70 y.o. female returns for *** regular *** visit for followup of ***   MEDICAL HISTORY: Past Medical History:  Diagnosis Date   Anemia    Arthritis    Asthma    Blood transfusion without reported diagnosis    Patients believes 2015 when she overdosed on "medications"    Breast cancer ([place])    Breast cancer of lower-outer quadrant of right female breast ([place]) 02/13/2015   Cataract    CHF, acute ([place]) 05/03/2012   Depression    Diabetes mellitus    Eczema    Fatty liver    Fatty infiltration of liver noted on 03/2012 CT scan   Fibromyalgia    Glaucoma    Heart disease    History of kidney stones    w/ hx of hydronephrosis - followed by [facility]   HIV nonspecific serology    2006: indeterminate HIV blood test, seen by ID, felt secondary to cross reacting antibodies with no further workup felt necessary at that time    Hyperlipidemia    Hypertension    Obesity    Personal history of radiation therapy 2016   right   Radiation 05/07/15-06/23/15   Right Breast    ALLERGIES:  has No Known Allergies.  MEDICATIONS:  Current Outpatient Medications  Medication Sig Dispense Refill   acetaminophen (TYLENOL) 650 MG CR tablet Take 650 mg by mouth every 8 (eight) hours as needed for pain.     allopurinol (ZYLOPRIM) 100 MG tablet TAKE 1 TABLET BY MOUTH  DAILY 90 tablet 3   atorvastatin (LIPITOR) 40 MG tablet TAKE 1 TABLET BY MOUTH  DAILY 90 tablet 3   BD AUTOSHIELD DUO 30G X 5 MM MISC USE ONCE DAILY TO INJECT  INSULIN AS DIRECTED 90 each 3   Blood Glucose Monitoring Suppl (ONETOUCH VERIO FLEX SYSTEM) w/Device KIT USE AS DIRECTED 1 kit 0   colchicine 0.6 MG tablet TAKE 1 TABLET BY MOUTH  DAILY 90 tablet 1   dorzolamide (TRUSOPT) 2 % ophthalmic solution 1 drop daily.      dorzolamide-timolol (COSOPT) 22.3-6.8 MG/ML ophthalmic solution Place 1 drop into both eyes 2 (two) times daily.  0   EASY COMFORT PEN NEEDLES 31G X 8 MM MISC USE TO INJECT INSULIN EVERY DAY AS DIRECTED 100 each 1   FLOVENT HFA 110 MCG/ACT inhaler USE 2 INHALATIONS BY MOUTH  TWICE DAILY 36 g 3   gabapentin (NEURONTIN) 100 MG capsule TAKE 3 CAPSULES BY MOUTH  TWICE DAILY AS NEEDED 360 capsule 3   insulin glargine (LANTUS) 100 UNIT/ML Solostar Pen Inject 32 units each morning. 15 mL 3   JARDIANCE 25 MG TABS tablet TAKE 1 TABLET BY MOUTH  DAILY 90 tablet 3   Lancet Devices (ONETOUCH DELICA PLUS LANCING) MISC USE ONCE DAILY 100 each 0   latanoprost (XALATAN) 0.005 % ophthalmic solution Place 1 drop into both eyes at bedtime.   0   losartan (COZAAR) 100 MG tablet TAKE 1 TABLET BY MOUTH AT  BEDTIME 90 tablet 3   metoprolol succinate (TOPROL-XL) 25 MG 24 hr tablet TAKE 1 TABLET BY MOUTH  DAILY 90 tablet 3   Miconazole Nitrate (LOTRIMIN AF) 2 % AERO Spray between toes once daily for 4 weeks. 150 g 1   OneTouch Delica Lancets 60F MISC  1 Container by Does not apply route daily as needed. 100 each prn   ONETOUCH VERIO test strip CHECK BLOOD SUGAR IN THE  MORNING AS DIRECTED 100 strip 3   OZEMPIC, 1 MG/DOSE, 4 MG/3ML SOPN INJECT SUBCUTANEOUSLY 1 MG EVERY WEEK 3 mL 11   spironolactone (ALDACTONE) 25 MG tablet TAKE 1 TABLET BY MOUTH  DAILY 90 tablet 3   No current facility-administered medications for this visit.    SURGICAL HISTORY:  Past Surgical History:  Procedure Laterality Date   BREAST BIOPSY Left 02/10/2015   malignant    BREAST LUMPECTOMY Right 03/21/2015   CHOLECYSTECTOMY  2003   CYSTOSCOPY W/ LITHOLAPAXY / EHL     JOINT REPLACEMENT     bilateral knee replacement   LEFT AND RIGHT HEART CATHETERIZATION WITH CORONARY ANGIOGRAM N/A 05/02/2012   Procedure: LEFT AND RIGHT HEART CATHETERIZATION WITH CORONARY ANGIOGRAM;  Surgeon: Burnell Blanks, MD;  Location: [facility] CATH LAB;  Service:  Cardiovascular;  Laterality: N/A;   RADIOACTIVE SEED GUIDED PARTIAL MASTECTOMY WITH AXILLARY SENTINEL LYMPH NODE BIOPSY Right 03/21/2015   Procedure: RIGHT  PARTIAL MASTECTOMY WITH RADIOACTIVE SEED LOCALIZATION RIGHT  AXILLARY SENTINEL LYMPH NODE BIOPSY;  Surgeon: Fanny Skates, MD;  Location: [facility];  Service: General;  Laterality: Right;   REPLACEMENT TOTAL KNEE BILATERAL  2005 &2006   VASCULAR SURGERY Right 03/15/2013   Ultrasound guided sclerotherapy    REVIEW OF SYSTEMS:   Review of Systems  Constitutional: Negative for appetite change, chills, fatigue, fever and unexpected weight change.  HENT:   Negative for mouth sores, nosebleeds, sore throat and trouble swallowing.   Eyes: Negative for eye problems and icterus.  Respiratory: Negative for cough, hemoptysis, shortness of breath and wheezing.   Cardiovascular: Negative for chest pain and leg swelling.  Gastrointestinal: Negative for abdominal pain, constipation, diarrhea, nausea and vomiting.  Genitourinary: Negative for bladder incontinence, difficulty urinating, dysuria, frequency and hematuria.   Musculoskeletal: Negative for back pain, gait problem, neck pain and neck stiffness.  Skin: Negative for itching and rash.  Neurological: Negative for dizziness, extremity weakness, gait problem, headaches, light-headedness and seizures.  Hematological: Negative for adenopathy. Does not bruise/bleed easily.  Psychiatric/Behavioral: Negative for confusion, depression and sleep disturbance. The patient is not nervous/anxious.     PHYSICAL EXAMINATION:  There were no vitals taken for this visit.  ECOG PERFORMANCE STATUS: {CHL ONC ECOG [id number]  Physical Exam  Constitutional: Oriented to person, place, and time and well-developed, well-nourished, and in no distress. No distress.  HENT:  Head: Normocephalic and atraumatic.  Mouth/Throat: Oropharynx is clear and moist. No oropharyngeal exudate.  Eyes:  Conjunctivae are normal. Right eye exhibits no discharge. Left eye exhibits no discharge. No scleral icterus.  Neck: Normal range of motion. Neck supple.  Cardiovascular: Normal rate, regular rhythm, normal heart sounds and intact distal pulses.   Pulmonary/Chest: Effort normal and breath sounds normal. No respiratory distress. No wheezes. No rales.  Abdominal: Soft. Bowel sounds are normal. Exhibits no distension and no mass. There is no tenderness.  Musculoskeletal: Normal range of motion. Exhibits no edema.  Lymphadenopathy:    No cervical adenopathy.  Neurological: Alert and oriented to person, place, and time. Exhibits normal muscle tone. Gait normal. Coordination normal.  Skin: Skin is warm and dry. No rash noted. Not diaphoretic. No erythema. No pallor.  Psychiatric: Mood, memory and judgment normal.  Vitals reviewed.  LABORATORY DATA: Lab Results  Component Value Date   WBC 10.4 12/30/2020   HGB 12.3 12/30/2020  HCT 40.5 12/30/2020   MCV 98.5 12/30/2020   PLT 226 12/30/2020      Chemistry      Component Value Date/Time   NA 140 12/30/2020 1120   NA 142 08/05/2020 1008   NA 141 08/03/2017 0936   K 4.9 12/30/2020 1120   K 4.9 08/03/2017 0936   CL 110 12/30/2020 1120   CO2 20 (L) 12/30/2020 1120   CO2 23 08/03/2017 0936   BUN 21 12/30/2020 1120   BUN 31 (H) 08/05/2020 1008   BUN 40.1 (H) 08/03/2017 0936   CREATININE 1.14 (H) 12/30/2020 1120   CREATININE 1.5 (H) 08/03/2017 0936      Component Value Date/Time   CALCIUM 9.4 12/30/2020 1120   CALCIUM 9.6 08/03/2017 0936   ALKPHOS 120 12/30/2020 1120   ALKPHOS 141 08/03/2017 0936   AST 27 12/30/2020 1120   AST 16 08/03/2017 0936   ALT 13 12/30/2020 1120   ALT 12 08/03/2017 0936   BILITOT 0.6 12/30/2020 1120   BILITOT 0.39 08/03/2017 0936       RADIOGRAPHIC STUDIES:  No results found.   ASSESSMENT/PLAN:  No problem-specific Assessment & Plan notes found for this encounter.   No orders of the defined  types were placed in this encounter.    I spent {CHL ONC TIME VISIT - NPYYF:[id number]} counseling the patient face to face. The total time spent in the appointment was {CHL ONC TIME VISIT - APOLI:[id number]}.  Esias Mory L Matix Henshaw, PA-C 11/16/21

## 2021-11-19 ENCOUNTER — Inpatient Hospital Stay: Payer: Medicare Other | Attending: Physician Assistant | Admitting: Hematology

## 2021-11-19 NOTE — Progress Notes (Deleted)
Sent to message to scheduling to reschedule pt's appts for 11/19/2021 to the week of 12/06/2021.

## 2021-11-20 ENCOUNTER — Ambulatory Visit: Payer: Medicare Other | Admitting: Physician Assistant

## 2021-11-20 ENCOUNTER — Other Ambulatory Visit: Payer: Medicare Other

## 2021-11-24 ENCOUNTER — Other Ambulatory Visit: Payer: Self-pay

## 2021-11-24 DIAGNOSIS — E1169 Type 2 diabetes mellitus with other specified complication: Secondary | ICD-10-CM

## 2021-11-24 MED ORDER — BLOOD GLUCOSE METER KIT: PACK | 0 refills | Status: AC

## 2021-11-24 MED ORDER — ONETOUCH VERIO W/DEVICE KIT: PACK | 0 refills | Status: DC

## 2021-11-24 MED ORDER — ONETOUCH VERIO VI STRP: ORAL_STRIP | 12 refills | Status: DC

## 2021-11-24 MED ORDER — ONETOUCH VERIO VI STRP
ORAL_STRIP | 3 refills | Status: DC
Start: 2021-11-24 — End: 2024-01-09

## 2021-11-24 NOTE — Progress Notes (Deleted)
[name] OFFICE PROGRESS NOTE  Lind Covert, MD [place] [address]  DIAGNOSIS: New left breast lump  Oncology History Overview Note    Breast cancer of lower-outer quadrant of right female breast   Staging form: Breast, AJCC 7th Edition     Clinical stage from 02/19/2015: Stage IA (T1b, N0, M0) - Unsigned       Staging comments: Staged at breast conference on 3.23.16      Pathologic stage from 03/24/2015: Stage IA (T1c, N0, cM0) - Signed by Enid Cutter, MD on 03/31/2015       Staging comments: Staged on final lumpectomy specimen by Dr. Donato Heinz.    Breast cancer of lower-outer quadrant of right female breast ([place])  01/23/2015 Mammogram   Possible mass in right breast warrants further evaluation.  No findings in the left breast suspicious for malignancy   02/03/2015 Breast US   Right breast: 5 x 5 x 5 mm irregular hypoechoic mass with internal color vascularity at 6 o'clock position, 4 cm from the nipple   02/10/2015 Initial Biopsy   Right needle core bx LOQ: Invasive ductal carcinoma, grade 1, ER+ (100%), PR+ (60%), HER2/neu negative (ratio 1.13), Ki67 3%; DCIS.     02/12/2015 Breast MRI   Biopsy-proven malignancy in the central right breast at posterior depth. No MR findings to suggest multicentric or contralateral malignancy.   02/12/2015 Clinical Stage   Stage IA: T1b N0   03/21/2015 Definitive Surgery   Right breast lumpectomy / SLNB Dalbert Batman): Invasive ductal carcinoma, grade 1, 1.1 cm, ER+ (100%), PR+ (73%), HER-2 negative (ratio 1.3), Ki67 6%, negative margins / no lymphovascular invasion; DCIS.  One lymph node negative for tumor (0/1).     03/21/2015 Oncotype testing   RS 7, low risk. (ROR 5% for 10-year recurrence with Tamoxifen alone).    03/21/2015 Pathologic Stage   Stage IA: pT1c pN0   05/07/2015 - 06/23/2015 Radiation Therapy   Adjuvant RT completed Pablo Ledger): Right breast 45 Gy over 25 fractions.   Right breast boost 16 Gy  over 8 fractions. Total dose: 60 Gy.   06/02/2015 - 05/2020 Anti-estrogen oral therapy   Aromasin 25 mg once daily Burr Medico).  Planned duration of therapy: 5 years, completed in 05/2020.    08/15/2015 Survivorship   Survivorship visit completed and copy of survivorship care plan provided to patient.   01/31/2018 Mammogram   IMPRESSION: No mammographic evidence for malignancy.    CURRENT THERAPY: Observation   INTERVAL HISTORY: [name] 70 y.o. female returns to the clinic today for a follow-up visit.  The patient recently saw her family provider on 11/11/2021 and was endorsing a new left-sided breast lump for approximately 1 week.  This area is nontender.  Denies any nipple discharge or overlying skin changes.  The patient is up-to-date on her mammogram and her last bilateral mammogram was on 03/06/2021 which was negative for any signs of malignancy.  The patient's primary provider referred her to the breast center and she is scheduled for ***.  She is also following up with [place] today for a more detailed discussion about her current condition and recommended treatment options.  The patient has a history of right-sided breast cancer stage Ia which was diagnosed in 2016.  She is status postlumpectomy and adjuvant radiation.  She then completed 5 years of Aromasin which was completed in July 2021.  Otherwise she denies any new concerning complaints today.  Denies any bone pain.  Fevers,  chills, night sweats, or unexplained weight loss.  Denies any chest pain, shortness of breath, cough, or hemoptysis.  Denies any abdominal pain, nausea, or vomiting.  Denies any headache or visual changes.  The patient is here today for evaluation and more detailed discussion about her current condition and recommended treatment options.   MEDICAL HISTORY: Past Medical History:  Diagnosis Date   Anemia    Arthritis    Asthma    Blood transfusion without reported diagnosis    Patients believes 2015 when she overdosed  on "medications"    Breast cancer ([place])    Breast cancer of lower-outer quadrant of right female breast ([place]) 02/13/2015   Cataract    CHF, acute ([place]) 05/03/2012   Depression    Diabetes mellitus    Eczema    Fatty liver    Fatty infiltration of liver noted on 03/2012 CT scan   Fibromyalgia    Glaucoma    Heart disease    History of kidney stones    w/ hx of hydronephrosis - followed by [facility]   HIV nonspecific serology    2006: indeterminate HIV blood test, seen by ID, felt secondary to cross reacting antibodies with no further workup felt necessary at that time    Hyperlipidemia    Hypertension    Obesity    Personal history of radiation therapy 2016   right   Radiation 05/07/15-06/23/15   Right Breast    ALLERGIES:  has No Known Allergies.  MEDICATIONS:  Current Outpatient Medications  Medication Sig Dispense Refill   acetaminophen (TYLENOL) 650 MG CR tablet Take 650 mg by mouth every 8 (eight) hours as needed for pain.     allopurinol (ZYLOPRIM) 100 MG tablet TAKE 1 TABLET BY MOUTH  DAILY 90 tablet 3   atorvastatin (LIPITOR) 40 MG tablet TAKE 1 TABLET BY MOUTH  DAILY 90 tablet 3   BD AUTOSHIELD DUO 30G X 5 MM MISC USE ONCE DAILY TO INJECT  INSULIN AS DIRECTED 90 each 3   Blood Glucose Monitoring Suppl (ONETOUCH VERIO FLEX SYSTEM) w/Device KIT USE AS DIRECTED 1 kit 0   colchicine 0.6 MG tablet TAKE 1 TABLET BY MOUTH  DAILY 90 tablet 1   dorzolamide (TRUSOPT) 2 % ophthalmic solution 1 drop daily.     dorzolamide-timolol (COSOPT) 22.3-6.8 MG/ML ophthalmic solution Place 1 drop into both eyes 2 (two) times daily.  0   EASY COMFORT PEN NEEDLES 31G X 8 MM MISC USE TO INJECT INSULIN EVERY DAY AS DIRECTED 100 each 1   FLOVENT HFA 110 MCG/ACT inhaler USE 2 INHALATIONS BY MOUTH  TWICE DAILY 36 g 3   gabapentin (NEURONTIN) 100 MG capsule TAKE 3 CAPSULES BY MOUTH  TWICE DAILY AS NEEDED 360 capsule 3   insulin glargine (LANTUS) 100 UNIT/ML Solostar Pen Inject 32 units each  morning. 15 mL 3   JARDIANCE 25 MG TABS tablet TAKE 1 TABLET BY MOUTH  DAILY 90 tablet 3   Lancet Devices (ONETOUCH DELICA PLUS LANCING) MISC USE ONCE DAILY 100 each 0   latanoprost (XALATAN) 0.005 % ophthalmic solution Place 1 drop into both eyes at bedtime.   0   losartan (COZAAR) 100 MG tablet TAKE 1 TABLET BY MOUTH AT  BEDTIME 90 tablet 3   metoprolol succinate (TOPROL-XL) 25 MG 24 hr tablet TAKE 1 TABLET BY MOUTH  DAILY 90 tablet 3   Miconazole Nitrate (LOTRIMIN AF) 2 % AERO Spray between toes once daily for 4 weeks. 150 g 1  OneTouch Delica Lancets 61P MISC 1 Container by Does not apply route daily as needed. 100 each prn   ONETOUCH VERIO test strip CHECK BLOOD SUGAR IN THE  MORNING AS DIRECTED 100 strip 3   OZEMPIC, 1 MG/DOSE, 4 MG/3ML SOPN INJECT SUBCUTANEOUSLY 1 MG EVERY WEEK 3 mL 11   spironolactone (ALDACTONE) 25 MG tablet TAKE 1 TABLET BY MOUTH  DAILY 90 tablet 3   No current facility-administered medications for this visit.    SURGICAL HISTORY:  Past Surgical History:  Procedure Laterality Date   BREAST BIOPSY Left 02/10/2015   malignant    BREAST LUMPECTOMY Right 03/21/2015   CHOLECYSTECTOMY  2003   CYSTOSCOPY W/ LITHOLAPAXY / EHL     JOINT REPLACEMENT     bilateral knee replacement   LEFT AND RIGHT HEART CATHETERIZATION WITH CORONARY ANGIOGRAM N/A 05/02/2012   Procedure: LEFT AND RIGHT HEART CATHETERIZATION WITH CORONARY ANGIOGRAM;  Surgeon: Burnell Blanks, MD;  Location[facility] CATH LAB;  Service: Cardiovascular;  Laterality: N/A;   RADIOACTIVE SEED GUIDED PARTIAL MASTECTOMY WITH AXILLARY SENTINEL LYMPH NODE BIOPSY Right 03/21/2015   Procedure: RIGHT  PARTIAL MASTECTOMY WITH RADIOACTIVE SEED LOCALIZATION RIGHT  AXILLARY SENTINEL LYMPH NODE BIOPSY;  Surgeon: Fanny Skates, MD;  Location: [facility];  Service: General;  Laterality: Right;   REPLACEMENT TOTAL KNEE BILATERAL  2005 &2006   VASCULAR SURGERY Right 03/15/2013   Ultrasound guided sclerotherapy     REVIEW OF SYSTEMS:   Review of Systems  Constitutional: Negative for appetite change, chills, fatigue, fever and unexpected weight change.  HENT:   Negative for mouth sores, nosebleeds, sore throat and trouble swallowing.   Eyes: Negative for eye problems and icterus.  Respiratory: Negative for cough, hemoptysis, shortness of breath and wheezing.   Cardiovascular: Negative for chest pain and leg swelling.  Gastrointestinal: Negative for abdominal pain, constipation, diarrhea, nausea and vomiting.  Genitourinary: Negative for bladder incontinence, difficulty urinating, dysuria, frequency and hematuria.   Musculoskeletal: Negative for back pain, gait problem, neck pain and neck stiffness.  Skin: Negative for itching and rash.  Neurological: Negative for dizziness, extremity weakness, gait problem, headaches, light-headedness and seizures.  Hematological: Negative for adenopathy. Does not bruise/bleed easily.  Psychiatric/Behavioral: Negative for confusion, depression and sleep disturbance. The patient is not nervous/anxious.     PHYSICAL EXAMINATION:  There were no vitals taken for this visit.  ECOG PERFORMANCE STATUS: {CHL ONC ECOG [id number]  Physical Exam  Constitutional: Oriented to person, place, and time and well-developed, well-nourished, and in no distress. No distress.  HENT:  Head: Normocephalic and atraumatic.  Mouth/Throat: Oropharynx is clear and moist. No oropharyngeal exudate.  Eyes: Conjunctivae are normal. Right eye exhibits no discharge. Left eye exhibits no discharge. No scleral icterus.  Neck: Normal range of motion. Neck supple.  Cardiovascular: Normal rate, regular rhythm, normal heart sounds and intact distal pulses.   Pulmonary/Chest: Effort normal and breath sounds normal. No respiratory distress. No wheezes. No rales.  Abdominal: Soft. Bowel sounds are normal. Exhibits no distension and no mass. There is no tenderness.  Musculoskeletal: Normal range of  motion. Exhibits no edema.  Lymphadenopathy:    No cervical adenopathy.  Neurological: Alert and oriented to person, place, and time. Exhibits normal muscle tone. Gait normal. Coordination normal.  Skin: Skin is warm and dry. No rash noted. Not diaphoretic. No erythema. No pallor.  Psychiatric: Mood, memory and judgment normal.  Vitals reviewed.  LABORATORY DATA: Lab Results  Component Value Date   WBC 10.4 12/30/2020  HGB 12.3 12/30/2020  ° HCT 40.5 12/30/2020  ° MCV 98.5 12/30/2020  ° PLT 226 12/30/2020  ° ° °  Chemistry   °   °Component Value Date/Time  ° NA 140 12/30/2020 1120  ° NA 142 08/05/2020 1008  ° NA 141 08/03/2017 0936  ° K 4.9 12/30/2020 1120  ° K 4.9 08/03/2017 0936  ° CL 110 12/30/2020 1120  ° CO2 20 (L) 12/30/2020 1120  ° CO2 23 08/03/2017 0936  ° BUN 21 12/30/2020 1120  ° BUN 31 (H) 08/05/2020 1008  ° BUN 40.1 (H) 08/03/2017 0936  ° CREATININE 1.14 (H) 12/30/2020 1120  ° CREATININE 1.5 (H) 08/03/2017 0936  °    °Component Value Date/Time  ° CALCIUM 9.4 12/30/2020 1120  ° CALCIUM 9.6 08/03/2017 0936  ° ALKPHOS 120 12/30/2020 1120  ° ALKPHOS 141 08/03/2017 0936  ° AST 27 12/30/2020 1120  ° AST 16 08/03/2017 0936  ° ALT 13 12/30/2020 1120  ° ALT 12 08/03/2017 0936  ° BILITOT 0.6 12/30/2020 1120  ° BILITOT 0.39 08/03/2017 0936  °  ° ° ° °RADIOGRAPHIC STUDIES: ° °No results found. ° ° °ASSESSMENT/PLAN:  °[name] is a 70 y.o. female with  °  °  °1. Breast cancer of lower-outer quadrant of right breast, invasive ductal carcinoma, pT1c N0 M0, stage IA, ER positive PR positive HER-2 negative, Ki 67 6%, and DCIS °-She was diagnosed in 01/2015. She is s/p right breast lumpectomy with SLNB and adjuvant Radiation.  °-Her Oncotype showed low risk, no need adjuvant chemotherapy °-She started antiestrogen therapy with Exemestane in 05/2015. She is tolerating well so far, we'll continue, plan for total 5 years until 05/2020.  °-She is clinically doing well. Lab reviewed, her CBC and CMP are within  normal limits except BG 171, BUN 32, Cr 1.45. Her physical exam and her 01/2019 mammogram were unremarkable. There is no clinical concern for recurrence. °-Continue Surveillance. Next mammogram in 01/2020  °-Continue Exemestane until 05/2020.  °-F/u in 1 year.  °  °  °2. Bone health °-Dr. Feng previously discussed the side effect of osteopenia and osteoporosis from Aromasin. She will complete in 05/2020 °-Her bone density scan in 08/2015 was normal. DEXA from 01/31/18 normal with T Score of -0.9. °-She will continue to take Vitamin D or multivitamin °-*** °  °3. Hypertension, diabetes, arthritis, CHF °-She will continue follow-up with her primary care physician °-She was bradycardic in 2020, Dr. Feng previously encouraged her to see PCP about medication adjustment.  °-She has stable mild back pain from arthritis. She does not currently have a orthopedic surgeon to follow up.  °-Her A1c slightly improved to 9.2 in 10/2019.   °  °4. CKD stage III  °-previously advised to avoid nephrotoxin  °-She follows up with nephrology, kidney function overall stable.  °  °5. Unstable gait  °-She has had this issue for several years, previous was seen by neurologist Dr. Athar  °-Previous brain MRI was negative °-Follow-up with primary care physician, she has completed PT.  °-Her balance has improved. Continued to encouraged her to continue PT exercises.   °  °6. Anemia °-She has mild chronic normocytic anemia, Probably secondary to her CKD °-Her prior study in 2014 showed elevated ferritin, normal iron °-Overall stable, we'll continue monitoring. Completed colonoscopy in 2022. Five 3-9 mm polyps in transverse colon and ascending colon. One polyp in descending colon and 1 in proximal sigmoid colon. Internal and external hemorrhoids noted. Pathology negative for malignancy.  °-  resolved *** °  °7. Bradycardia*** °-She was found to be bradycardia on the vital sign, she was asymmetric. °-Prior EKG during her visit, which showed sinus rhythm  86, with frequent premature ventricular complexes. No significant ST change on the EKG. °  °  °Plan °-Bilateral diagnostic mammogran with US guided needle core biopsy °-Lab and f/u in *** year  ° °  ° ° ° ° ° °No orders of the defined types were placed in this encounter. °  ° °I spent {CHL ONC TIME VISIT - SWIFT:[id number]} counseling the patient face to face. The total time spent in the appointment was {CHL ONC TIME VISIT - SWIFT:[id number]}. ° °Cassandra L Heilingoetter, PA-C °11/24/21 ° °

## 2021-11-24 NOTE — Progress Notes (Incomplete)
[facility] OFFICE PROGRESS NOTE  Lind Covert, MD [place] [address]  DIAGNOSIS: ***  PRIOR THERAPY:  CURRENT THERAPY:  INTERVAL HISTORY: [name] 70 y.o. female returns for *** regular *** visit for followup of ***   MEDICAL HISTORY: Past Medical History:  Diagnosis Date   Anemia    Arthritis    Asthma    Blood transfusion without reported diagnosis    Patients believes 2015 when she overdosed on "medications"    Breast cancer ([place])    Breast cancer of lower-outer quadrant of right female breast ([place]) 02/13/2015   Cataract    CHF, acute ([place]) 05/03/2012   Depression    Diabetes mellitus    Eczema    Fatty liver    Fatty infiltration of liver noted on 03/2012 CT scan   Fibromyalgia    Glaucoma    Heart disease    History of kidney stones    w/ hx of hydronephrosis - followed by [facility]   HIV nonspecific serology    2006: indeterminate HIV blood test, seen by ID, felt secondary to cross reacting antibodies with no further workup felt necessary at that time    Hyperlipidemia    Hypertension    Obesity    Personal history of radiation therapy 2016   right   Radiation 05/07/15-06/23/15   Right Breast    ALLERGIES:  has No Known Allergies.  MEDICATIONS:  Current Outpatient Medications  Medication Sig Dispense Refill   acetaminophen (TYLENOL) 650 MG CR tablet Take 650 mg by mouth every 8 (eight) hours as needed for pain.     allopurinol (ZYLOPRIM) 100 MG tablet TAKE 1 TABLET BY MOUTH  DAILY 90 tablet 3   atorvastatin (LIPITOR) 40 MG tablet TAKE 1 TABLET BY MOUTH  DAILY 90 tablet 3   BD AUTOSHIELD DUO 30G X 5 MM MISC USE ONCE DAILY TO INJECT  INSULIN AS DIRECTED 90 each 3   Blood Glucose Monitoring Suppl (ONETOUCH VERIO FLEX SYSTEM) w/Device KIT USE AS DIRECTED 1 kit 0   colchicine 0.6 MG tablet TAKE 1 TABLET BY MOUTH  DAILY 90 tablet 1   dorzolamide (TRUSOPT) 2 % ophthalmic solution 1 drop daily.      dorzolamide-timolol (COSOPT) 22.3-6.8 MG/ML ophthalmic solution Place 1 drop into both eyes 2 (two) times daily.  0   EASY COMFORT PEN NEEDLES 31G X 8 MM MISC USE TO INJECT INSULIN EVERY DAY AS DIRECTED 100 each 1   FLOVENT HFA 110 MCG/ACT inhaler USE 2 INHALATIONS BY MOUTH  TWICE DAILY 36 g 3   gabapentin (NEURONTIN) 100 MG capsule TAKE 3 CAPSULES BY MOUTH  TWICE DAILY AS NEEDED 360 capsule 3   insulin glargine (LANTUS) 100 UNIT/ML Solostar Pen Inject 32 units each morning. 15 mL 3   JARDIANCE 25 MG TABS tablet TAKE 1 TABLET BY MOUTH  DAILY 90 tablet 3   Lancet Devices (ONETOUCH DELICA PLUS LANCING) MISC USE ONCE DAILY 100 each 0   latanoprost (XALATAN) 0.005 % ophthalmic solution Place 1 drop into both eyes at bedtime.   0   losartan (COZAAR) 100 MG tablet TAKE 1 TABLET BY MOUTH AT  BEDTIME 90 tablet 3   metoprolol succinate (TOPROL-XL) 25 MG 24 hr tablet TAKE 1 TABLET BY MOUTH  DAILY 90 tablet 3   Miconazole Nitrate (LOTRIMIN AF) 2 % AERO Spray between toes once daily for 4 weeks. 150 g 1   OneTouch Delica Lancets 60F MISC  1 Container by Does not apply route daily as needed. 100 each prn   ONETOUCH VERIO test strip CHECK BLOOD SUGAR IN THE  MORNING AS DIRECTED 100 strip 3   OZEMPIC, 1 MG/DOSE, 4 MG/3ML SOPN INJECT SUBCUTANEOUSLY 1 MG EVERY WEEK 3 mL 11   spironolactone (ALDACTONE) 25 MG tablet TAKE 1 TABLET BY MOUTH  DAILY 90 tablet 3   No current facility-administered medications for this visit.    SURGICAL HISTORY:  Past Surgical History:  Procedure Laterality Date   BREAST BIOPSY Left 02/10/2015   malignant    BREAST LUMPECTOMY Right 03/21/2015   CHOLECYSTECTOMY  2003   CYSTOSCOPY W/ LITHOLAPAXY / EHL     JOINT REPLACEMENT     bilateral knee replacement   LEFT AND RIGHT HEART CATHETERIZATION WITH CORONARY ANGIOGRAM N/A 05/02/2012   Procedure: LEFT AND RIGHT HEART CATHETERIZATION WITH CORONARY ANGIOGRAM;  Surgeon: Burnell Blanks, MD;  Location: [facility] CATH LAB;  Service:  Cardiovascular;  Laterality: N/A;   RADIOACTIVE SEED GUIDED PARTIAL MASTECTOMY WITH AXILLARY SENTINEL LYMPH NODE BIOPSY Right 03/21/2015   Procedure: RIGHT  PARTIAL MASTECTOMY WITH RADIOACTIVE SEED LOCALIZATION RIGHT  AXILLARY SENTINEL LYMPH NODE BIOPSY;  Surgeon: Fanny Skates, MD;  Location: [facility];  Service: General;  Laterality: Right;   REPLACEMENT TOTAL KNEE BILATERAL  2005 &2006   VASCULAR SURGERY Right 03/15/2013   Ultrasound guided sclerotherapy    REVIEW OF SYSTEMS:   Review of Systems  Constitutional: Negative for appetite change, chills, fatigue, fever and unexpected weight change.  HENT:   Negative for mouth sores, nosebleeds, sore throat and trouble swallowing.   Eyes: Negative for eye problems and icterus.  Respiratory: Negative for cough, hemoptysis, shortness of breath and wheezing.   Cardiovascular: Negative for chest pain and leg swelling.  Gastrointestinal: Negative for abdominal pain, constipation, diarrhea, nausea and vomiting.  Genitourinary: Negative for bladder incontinence, difficulty urinating, dysuria, frequency and hematuria.   Musculoskeletal: Negative for back pain, gait problem, neck pain and neck stiffness.  Skin: Negative for itching and rash.  Neurological: Negative for dizziness, extremity weakness, gait problem, headaches, light-headedness and seizures.  Hematological: Negative for adenopathy. Does not bruise/bleed easily.  Psychiatric/Behavioral: Negative for confusion, depression and sleep disturbance. The patient is not nervous/anxious.     PHYSICAL EXAMINATION:  There were no vitals taken for this visit.  ECOG PERFORMANCE STATUS: {CHL ONC ECOG [id number]  Physical Exam  Constitutional: Oriented to person, place, and time and well-developed, well-nourished, and in no distress. No distress.  HENT:  Head: Normocephalic and atraumatic.  Mouth/Throat: Oropharynx is clear and moist. No oropharyngeal exudate.  Eyes:  Conjunctivae are normal. Right eye exhibits no discharge. Left eye exhibits no discharge. No scleral icterus.  Neck: Normal range of motion. Neck supple.  Cardiovascular: Normal rate, regular rhythm, normal heart sounds and intact distal pulses.   Pulmonary/Chest: Effort normal and breath sounds normal. No respiratory distress. No wheezes. No rales.  Abdominal: Soft. Bowel sounds are normal. Exhibits no distension and no mass. There is no tenderness.  Musculoskeletal: Normal range of motion. Exhibits no edema.  Lymphadenopathy:    No cervical adenopathy.  Neurological: Alert and oriented to person, place, and time. Exhibits normal muscle tone. Gait normal. Coordination normal.  Skin: Skin is warm and dry. No rash noted. Not diaphoretic. No erythema. No pallor.  Psychiatric: Mood, memory and judgment normal.  Vitals reviewed.  LABORATORY DATA: Lab Results  Component Value Date   WBC 10.4 12/30/2020   HGB 12.3 12/30/2020  HCT 40.5 12/30/2020   MCV 98.5 12/30/2020   PLT 226 12/30/2020      Chemistry      Component Value Date/Time   NA 140 12/30/2020 1120   NA 142 08/05/2020 1008   NA 141 08/03/2017 0936   K 4.9 12/30/2020 1120   K 4.9 08/03/2017 0936   CL 110 12/30/2020 1120   CO2 20 (L) 12/30/2020 1120   CO2 23 08/03/2017 0936   BUN 21 12/30/2020 1120   BUN 31 (H) 08/05/2020 1008   BUN 40.1 (H) 08/03/2017 0936   CREATININE 1.14 (H) 12/30/2020 1120   CREATININE 1.5 (H) 08/03/2017 0936      Component Value Date/Time   CALCIUM 9.4 12/30/2020 1120   CALCIUM 9.6 08/03/2017 0936   ALKPHOS 120 12/30/2020 1120   ALKPHOS 141 08/03/2017 0936   AST 27 12/30/2020 1120   AST 16 08/03/2017 0936   ALT 13 12/30/2020 1120   ALT 12 08/03/2017 0936   BILITOT 0.6 12/30/2020 1120   BILITOT 0.39 08/03/2017 0936       RADIOGRAPHIC STUDIES:  No results found.   ASSESSMENT/PLAN:  No problem-specific Assessment & Plan notes found for this encounter.   No orders of the defined  types were placed in this encounter.    I spent {CHL ONC TIME VISIT - UJWJX:[id number]} counseling the patient face to face. The total time spent in the appointment was {CHL ONC TIME VISIT - ZHYQM:[id number]}.  Kirsten Karapetian L Shakemia Madera, PA-C 11/24/21

## 2021-11-24 NOTE — Progress Notes (Deleted)
[facility] OFFICE PROGRESS NOTE  Lind Covert, MD [place] [address]  DIAGNOSIS:  Oncology History Overview Note    Breast cancer of lower-outer quadrant of right female breast   Staging form: Breast, AJCC 7th Edition     Clinical stage from 02/19/2015: Stage IA (T1b, N0, M0) - Unsigned       Staging comments: Staged at breast conference on 3.23.16      Pathologic stage from 03/24/2015: Stage IA (T1c, N0, cM0) - Signed by Enid Cutter, MD on 03/31/2015       Staging comments: Staged on final lumpectomy specimen by Dr. Donato Heinz.    Breast cancer of lower-outer quadrant of right female breast ([place])  01/23/2015 Mammogram   Possible mass in right breast warrants further evaluation.  No findings in the left breast suspicious for malignancy   02/03/2015 Breast US   Right breast: 5 x 5 x 5 mm irregular hypoechoic mass with internal color vascularity at 6 o'clock position, 4 cm from the nipple   02/10/2015 Initial Biopsy   Right needle core bx LOQ: Invasive ductal carcinoma, grade 1, ER+ (100%), PR+ (60%), HER2/neu negative (ratio 1.13), Ki67 3%; DCIS.     02/12/2015 Breast MRI   Biopsy-proven malignancy in the central right breast at posterior depth. No MR findings to suggest multicentric or contralateral malignancy.   02/12/2015 Clinical Stage   Stage IA: T1b N0   03/21/2015 Definitive Surgery   Right breast lumpectomy / [facility] Dalbert Batman): Invasive ductal carcinoma, grade 1, 1.1 cm, ER+ (100%), PR+ (73%), HER-2 negative (ratio 1.3), Ki67 6%, negative margins / no lymphovascular invasion; DCIS.  One lymph node negative for tumor (0/1).     03/21/2015 Oncotype testing   RS 7, low risk. (ROR 5% for 10-year recurrence with Tamoxifen alone).    03/21/2015 Pathologic Stage   Stage IA: pT1c pN0   05/07/2015 - 06/23/2015 Radiation Therapy   Adjuvant RT completed Pablo Ledger): Right breast 45 Gy over 25 fractions.   Right breast boost 16 Gy over 8 fractions.  Total dose: 60 Gy.   06/02/2015 - 05/2020 Anti-estrogen oral therapy   Aromasin 25 mg once daily Burr Medico).  Planned duration of therapy: 5 years, completed in 05/2020.    08/15/2015 Survivorship   Survivorship visit completed and copy of survivorship care plan provided to patient.   01/31/2018 Mammogram   IMPRESSION: No mammographic evidence for malignancy.     CURRENT THERAPY: Observation   INTERVAL HISTORY: [name] 70 y.o. female returns     MEDICAL HISTORY: Past Medical History:  Diagnosis Date   Anemia    Arthritis    Asthma    Blood transfusion without reported diagnosis    Patients believes 2015 when she overdosed on "medications"    Breast cancer ([place])    Breast cancer of lower-outer quadrant of right female breast ([place]) 02/13/2015   Cataract    CHF, acute ([place]) 05/03/2012   Depression    Diabetes mellitus    Eczema    Fatty liver    Fatty infiltration of liver noted on 03/2012 CT scan   Fibromyalgia    Glaucoma    Heart disease    History of kidney stones    w/ hx of hydronephrosis - followed by [facility]   HIV nonspecific serology    2006: indeterminate HIV blood test, seen by ID, felt secondary to cross reacting antibodies with no further workup felt necessary at that time  Hyperlipidemia    Hypertension    Obesity    Personal history of radiation therapy 2016   right   Radiation 05/07/15-06/23/15   Right Breast    ALLERGIES:  has No Known Allergies.  MEDICATIONS:  Current Outpatient Medications  Medication Sig Dispense Refill   acetaminophen (TYLENOL) 650 MG CR tablet Take 650 mg by mouth every 8 (eight) hours as needed for pain.     allopurinol (ZYLOPRIM) 100 MG tablet TAKE 1 TABLET BY MOUTH  DAILY 90 tablet 3   atorvastatin (LIPITOR) 40 MG tablet TAKE 1 TABLET BY MOUTH  DAILY 90 tablet 3   BD AUTOSHIELD DUO 30G X 5 MM MISC USE ONCE DAILY TO INJECT  INSULIN AS DIRECTED 90 each 3   Blood Glucose Monitoring Suppl (ONETOUCH VERIO FLEX SYSTEM)  w/Device KIT USE AS DIRECTED 1 kit 0   colchicine 0.6 MG tablet TAKE 1 TABLET BY MOUTH  DAILY 90 tablet 1   dorzolamide (TRUSOPT) 2 % ophthalmic solution 1 drop daily.     dorzolamide-timolol (COSOPT) 22.3-6.8 MG/ML ophthalmic solution Place 1 drop into both eyes 2 (two) times daily.  0   EASY COMFORT PEN NEEDLES 31G X 8 MM MISC USE TO INJECT INSULIN EVERY DAY AS DIRECTED 100 each 1   FLOVENT HFA 110 MCG/ACT inhaler USE 2 INHALATIONS BY MOUTH  TWICE DAILY 36 g 3   gabapentin (NEURONTIN) 100 MG capsule TAKE 3 CAPSULES BY MOUTH  TWICE DAILY AS NEEDED 360 capsule 3   insulin glargine (LANTUS) 100 UNIT/ML Solostar Pen Inject 32 units each morning. 15 mL 3   JARDIANCE 25 MG TABS tablet TAKE 1 TABLET BY MOUTH  DAILY 90 tablet 3   Lancet Devices (ONETOUCH DELICA PLUS LANCING) MISC USE ONCE DAILY 100 each 0   latanoprost (XALATAN) 0.005 % ophthalmic solution Place 1 drop into both eyes at bedtime.   0   losartan (COZAAR) 100 MG tablet TAKE 1 TABLET BY MOUTH AT  BEDTIME 90 tablet 3   metoprolol succinate (TOPROL-XL) 25 MG 24 hr tablet TAKE 1 TABLET BY MOUTH  DAILY 90 tablet 3   Miconazole Nitrate (LOTRIMIN AF) 2 % AERO Spray between toes once daily for 4 weeks. 150 g 1   OneTouch Delica Lancets 83T MISC 1 Container by Does not apply route daily as needed. 100 each prn   ONETOUCH VERIO test strip CHECK BLOOD SUGAR IN THE  MORNING AS DIRECTED 100 strip 3   OZEMPIC, 1 MG/DOSE, 4 MG/3ML SOPN INJECT SUBCUTANEOUSLY 1 MG EVERY WEEK 3 mL 11   spironolactone (ALDACTONE) 25 MG tablet TAKE 1 TABLET BY MOUTH  DAILY 90 tablet 3   No current facility-administered medications for this visit.    SURGICAL HISTORY:  Past Surgical History:  Procedure Laterality Date   BREAST BIOPSY Left 02/10/2015   malignant    BREAST LUMPECTOMY Right 03/21/2015   CHOLECYSTECTOMY  2003   CYSTOSCOPY W/ LITHOLAPAXY / EHL     JOINT REPLACEMENT     bilateral knee replacement   LEFT AND RIGHT HEART CATHETERIZATION WITH CORONARY  ANGIOGRAM N/A 05/02/2012   Procedure: LEFT AND RIGHT HEART CATHETERIZATION WITH CORONARY ANGIOGRAM;  Surgeon: Burnell Blanks, MD;  Location: [facility] CATH LAB;  Service: Cardiovascular;  Laterality: N/A;   RADIOACTIVE SEED GUIDED PARTIAL MASTECTOMY WITH AXILLARY SENTINEL LYMPH NODE BIOPSY Right 03/21/2015   Procedure: RIGHT  PARTIAL MASTECTOMY WITH RADIOACTIVE SEED LOCALIZATION RIGHT  AXILLARY SENTINEL LYMPH NODE BIOPSY;  Surgeon: Fanny Skates, MD;  Location: [facility];  Service: General;  Laterality: Right;   REPLACEMENT TOTAL KNEE BILATERAL  2005 &2006   VASCULAR SURGERY Right 03/15/2013   Ultrasound guided sclerotherapy    REVIEW OF SYSTEMS:   Review of Systems  Constitutional: Negative for appetite change, chills, fatigue, fever and unexpected weight change.  HENT:   Negative for mouth sores, nosebleeds, sore throat and trouble swallowing.   Eyes: Negative for eye problems and icterus.  Respiratory: Negative for cough, hemoptysis, shortness of breath and wheezing.   Cardiovascular: Negative for chest pain and leg swelling.  Gastrointestinal: Negative for abdominal pain, constipation, diarrhea, nausea and vomiting.  Genitourinary: Negative for bladder incontinence, difficulty urinating, dysuria, frequency and hematuria.   Musculoskeletal: Negative for back pain, gait problem, neck pain and neck stiffness.  Skin: Negative for itching and rash.  Neurological: Negative for dizziness, extremity weakness, gait problem, headaches, light-headedness and seizures.  Hematological: Negative for adenopathy. Does not bruise/bleed easily.  Psychiatric/Behavioral: Negative for confusion, depression and sleep disturbance. The patient is not nervous/anxious.     PHYSICAL EXAMINATION:  There were no vitals taken for this visit.  ECOG PERFORMANCE STATUS: {CHL ONC ECOG [id number]  Physical Exam  Constitutional: Oriented to person, place, and time and well-developed, well-nourished,  and in no distress. No distress.  HENT:  Head: Normocephalic and atraumatic.  Mouth/Throat: Oropharynx is clear and moist. No oropharyngeal exudate.  Eyes: Conjunctivae are normal. Right eye exhibits no discharge. Left eye exhibits no discharge. No scleral icterus.  Neck: Normal range of motion. Neck supple.  Cardiovascular: Normal rate, regular rhythm, normal heart sounds and intact distal pulses.   Pulmonary/Chest: Effort normal and breath sounds normal. No respiratory distress. No wheezes. No rales.  Abdominal: Soft. Bowel sounds are normal. Exhibits no distension and no mass. There is no tenderness.  Musculoskeletal: Normal range of motion. Exhibits no edema.  Lymphadenopathy:    No cervical adenopathy.  Neurological: Alert and oriented to person, place, and time. Exhibits normal muscle tone. Gait normal. Coordination normal.  Skin: Skin is warm and dry. No rash noted. Not diaphoretic. No erythema. No pallor.  Psychiatric: Mood, memory and judgment normal.  Vitals reviewed.  LABORATORY DATA: Lab Results  Component Value Date   WBC 10.4 12/30/2020   HGB 12.3 12/30/2020   HCT 40.5 12/30/2020   MCV 98.5 12/30/2020   PLT 226 12/30/2020      Chemistry      Component Value Date/Time   NA 140 12/30/2020 1120   NA 142 08/05/2020 1008   NA 141 08/03/2017 0936   K 4.9 12/30/2020 1120   K 4.9 08/03/2017 0936   CL 110 12/30/2020 1120   CO2 20 (L) 12/30/2020 1120   CO2 23 08/03/2017 0936   BUN 21 12/30/2020 1120   BUN 31 (H) 08/05/2020 1008   BUN 40.1 (H) 08/03/2017 0936   CREATININE 1.14 (H) 12/30/2020 1120   CREATININE 1.5 (H) 08/03/2017 0936      Component Value Date/Time   CALCIUM 9.4 12/30/2020 1120   CALCIUM 9.6 08/03/2017 0936   ALKPHOS 120 12/30/2020 1120   ALKPHOS 141 08/03/2017 0936   AST 27 12/30/2020 1120   AST 16 08/03/2017 0936   ALT 13 12/30/2020 1120   ALT 12 08/03/2017 0936   BILITOT 0.6 12/30/2020 1120   BILITOT 0.39 08/03/2017 0936        RADIOGRAPHIC STUDIES:  No results found.   ASSESSMENT/PLAN:  No problem-specific Assessment & Plan notes found for this encounter.   No orders  of the defined types were placed in this encounter.    I spent {CHL ONC TIME VISIT - HRCBU:[id number]} counseling the patient face to face. The total time spent in the appointment was {CHL ONC TIME VISIT - HOZYY:[id number]}.  Suhayb Anzalone L Rhyder Koegel, PA-C 11/24/21

## 2021-11-25 ENCOUNTER — Other Ambulatory Visit: Payer: Medicare Other

## 2021-11-25 ENCOUNTER — Ambulatory Visit: Payer: Medicare Other | Admitting: Physician Assistant

## 2021-12-01 ENCOUNTER — Inpatient Hospital Stay (HOSPITAL_BASED_OUTPATIENT_CLINIC_OR_DEPARTMENT_OTHER): Payer: Medicare Other | Admitting: Nurse Practitioner

## 2021-12-01 ENCOUNTER — Encounter: Payer: Self-pay | Admitting: Nurse Practitioner

## 2021-12-01 ENCOUNTER — Other Ambulatory Visit: Payer: Self-pay

## 2021-12-01 ENCOUNTER — Inpatient Hospital Stay: Payer: Medicare Other | Attending: Hematology

## 2021-12-01 VITALS — BP 158/90 | HR 77 | Temp 97.8°F | Resp 19 | Ht 62.0 in | Wt 217.9 lb

## 2021-12-01 DIAGNOSIS — N6324 Unspecified lump in the left breast, lower inner quadrant: Secondary | ICD-10-CM

## 2021-12-01 DIAGNOSIS — Z170 Estrogen receptor positive status [ER+]: Secondary | ICD-10-CM

## 2021-12-01 DIAGNOSIS — Z923 Personal history of irradiation: Secondary | ICD-10-CM | POA: Insufficient documentation

## 2021-12-01 DIAGNOSIS — N611 Abscess of the breast and nipple: Secondary | ICD-10-CM | POA: Diagnosis not present

## 2021-12-01 DIAGNOSIS — C50511 Malignant neoplasm of lower-outer quadrant of right female breast: Secondary | ICD-10-CM | POA: Diagnosis not present

## 2021-12-01 DIAGNOSIS — Z79899 Other long term (current) drug therapy: Secondary | ICD-10-CM | POA: Diagnosis not present

## 2021-12-01 DIAGNOSIS — N6332 Unspecified lump in axillary tail of the left breast: Secondary | ICD-10-CM | POA: Insufficient documentation

## 2021-12-01 DIAGNOSIS — Z853 Personal history of malignant neoplasm of breast: Secondary | ICD-10-CM | POA: Insufficient documentation

## 2021-12-01 LAB — CMP (CANCER CENTER ONLY)
ALT: 13 U/L (ref 0–44)
AST: 16 U/L (ref 15–41)
Albumin: 4.2 g/dL (ref 3.5–5.0)
Alkaline Phosphatase: 114 U/L (ref 38–126)
Anion gap: 10 (ref 5–15)
BUN: 29 mg/dL — ABNORMAL HIGH (ref 8–23)
CO2: 25 mmol/L (ref 22–32)
Calcium: 9.4 mg/dL (ref 8.9–10.3)
Chloride: 103 mmol/L (ref 98–111)
Creatinine: 1.26 mg/dL — ABNORMAL HIGH (ref 0.44–1.00)
GFR, Estimated: 45 mL/min — ABNORMAL LOW (ref >60)
Glucose, Bld: 201 mg/dL — ABNORMAL HIGH (ref 70–99)
Potassium: 4.1 mmol/L (ref 3.5–5.1)
Sodium: 138 mmol/L (ref 135–145)
Total Bilirubin: 0.7 mg/dL (ref 0.3–1.2)
Total Protein: 7.8 g/dL (ref 6.5–8.1)

## 2021-12-01 LAB — CBC WITH DIFFERENTIAL (CANCER CENTER ONLY)
Abs Immature Granulocytes: 0.02 10*3/uL (ref 0.00–0.07)
Basophils Absolute: 0 10*3/uL (ref 0.0–0.1)
Basophils Relative: 1 %
Eosinophils Absolute: 0.2 10*3/uL (ref 0.0–0.5)
Eosinophils Relative: 3 %
HCT: 38.5 % (ref 36.0–46.0)
Hemoglobin: 12.1 g/dL (ref 12.0–15.0)
Immature Granulocytes: 0 %
Lymphocytes Relative: 38 %
Lymphs Abs: 2.7 10*3/uL (ref 0.7–4.0)
MCH: 30.9 pg (ref 26.0–34.0)
MCHC: 31.4 g/dL (ref 30.0–36.0)
MCV: 98.2 fL (ref 80.0–100.0)
Monocytes Absolute: 0.7 10*3/uL (ref 0.1–1.0)
Monocytes Relative: 10 %
Neutro Abs: 3.4 10*3/uL (ref 1.7–7.7)
Neutrophils Relative %: 48 %
Platelet Count: 144 10*3/uL — ABNORMAL LOW (ref 150–400)
RBC: 3.92 MIL/uL (ref 3.87–5.11)
RDW: 13.1 % (ref 11.5–15.5)
WBC Count: 7.1 10*3/uL (ref 4.0–10.5)
nRBC: 0 % (ref 0.0–0.2)

## 2021-12-01 MED ORDER — DOXYCYCLINE HYCLATE 100 MG PO TABS: 100.0000 mg | ORAL_TABLET | Freq: Two times a day (BID) | ORAL | 0 refills | Status: DC

## 2021-12-01 NOTE — Progress Notes (Signed)
[place]   [phone] [phone]   Clinic Follow up Note   Patient Care Team: Lind Covert, MD as PCP - General (Family Medicine) Gevena Cotton, MD (Ophthalmology) Rana Snare, MD (Inactive) (Urology) Dr. Edwin Cap (Dental General Practice) Fanny Skates, MD as Consulting Physician (General Surgery) Holley Bouche, NP (Inactive) as Nurse Practitioner (Nurse Practitioner) Truitt Merle, MD as Consulting Physician (Hematology) Gardiner Barefoot, DPM (Podiatry) Pleasant, Eppie Gibson, RN as [place] [facility] Chambliss, Jeb Levering, MD (Family Medicine) 12/01/2021  CHIEF COMPLAINT: New left breast mass, history of right breast cancer  SUMMARY OF ONCOLOGIC HISTORY: Oncology History Overview Note    Breast cancer of lower-outer quadrant of right female breast   Staging form: Breast, AJCC 7th Edition     Clinical stage from 02/19/2015: Stage IA (T1b, N0, M0) - Unsigned       Staging comments: Staged at breast conference on 3.23.16      Pathologic stage from 03/24/2015: Stage IA (T1c, N0, cM0) - Signed by Enid Cutter, MD on 03/31/2015       Staging comments: Staged on final lumpectomy specimen by Dr. Donato Heinz.    Breast cancer of lower-outer quadrant of right female breast ([place])  01/23/2015 Mammogram   Possible mass in right breast warrants further evaluation.  No findings in the left breast suspicious for malignancy   02/03/2015 Breast US   Right breast: 5 x 5 x 5 mm irregular hypoechoic mass with internal color vascularity at 6 o'clock position, 4 cm from the nipple   02/10/2015 Initial Biopsy   Right needle core bx LOQ: Invasive ductal carcinoma, grade 1, ER+ (100%), PR+ (60%), HER2/neu negative (ratio 1.13), Ki67 3%; DCIS.     02/12/2015 Breast MRI   Biopsy-proven malignancy in the central right breast at posterior depth. No MR findings to suggest multicentric or contralateral malignancy.   02/12/2015 Clinical Stage    Stage IA: T1b N0   03/21/2015 Definitive Surgery   Right breast lumpectomy / SLNB Dalbert Batman): Invasive ductal carcinoma, grade 1, 1.1 cm, ER+ (100%), PR+ (73%), HER-2 negative (ratio 1.3), Ki67 6%, negative margins / no lymphovascular invasion; DCIS.  One lymph node negative for tumor (0/1).     03/21/2015 Oncotype testing   RS 7, low risk. (ROR 5% for 10-year recurrence with Tamoxifen alone).    03/21/2015 Pathologic Stage   Stage IA: pT1c pN0   05/07/2015 - 06/23/2015 Radiation Therapy   Adjuvant RT completed Pablo Ledger): Right breast 45 Gy over 25 fractions.   Right breast boost 16 Gy over 8 fractions. Total dose: 60 Gy.   06/02/2015 - 05/2020 Anti-estrogen oral therapy   Aromasin 25 mg once daily Burr Medico).  Planned duration of therapy: 5 years, completed in 05/2020.    08/15/2015 Survivorship   Survivorship visit completed and copy of survivorship care plan provided to patient.   01/31/2018 Mammogram   IMPRESSION: No mammographic evidence for malignancy.     CURRENT THERAPY:  Surveillance for history of right breast cancer Pending further work up for new left breast mass  INTERVAL HISTORY: Ms. Hentges presents today for follow-up, referred back to [place] by PCP for palpable new left breast mass.  Last seen by me 12/30/2020 for routine surveillance follow-up, breast exam at the time was unremarkable.  Screening bilateral mammogram 03/06/2021 showed breast density category B, no evidence of malignancy.  About 3 weeks ago she palpated a small mass in the left axilla and a large "boil" in the lower  left breast near the inframammary fold.  This began draining over the holidays and is getting smaller.  She noticed drainage on her bra.  She denies fever or chills.  Denies other constitutional symptoms such as increased fatigue, decreased appetite, or weight loss.  Denies new or worsening bone/joint pain, nausea/vomiting, abdominal bloating or pain, or any other new concerns.   MEDICAL HISTORY:  Past Medical  History:  Diagnosis Date   Anemia    Arthritis    Asthma    Blood transfusion without reported diagnosis    Patients believes 2015 when she overdosed on "medications"    Breast cancer ([place])    Breast cancer of lower-outer quadrant of right female breast ([place]) 02/13/2015   Cataract    CHF, acute ([place]) 05/03/2012   Depression    Diabetes mellitus    Eczema    Fatty liver    Fatty infiltration of liver noted on 03/2012 CT scan   Fibromyalgia    Glaucoma    Heart disease    History of kidney stones    w/ hx of hydronephrosis - followed by [facility]   HIV nonspecific serology    2006: indeterminate HIV blood test, seen by ID, felt secondary to cross reacting antibodies with no further workup felt necessary at that time    Hyperlipidemia    Hypertension    Obesity    Personal history of radiation therapy 2016   right   Radiation 05/07/15-06/23/15   Right Breast    SURGICAL HISTORY: Past Surgical History:  Procedure Laterality Date   BREAST BIOPSY Left 02/10/2015   malignant    BREAST LUMPECTOMY Right 03/21/2015   CHOLECYSTECTOMY  2003   CYSTOSCOPY W/ LITHOLAPAXY / E[facility]     JOINT REPLACEMENT     bilateral knee replacement   LEFT AND RIGHT HEART CATHETERIZATION WITH CORONARY ANGIOGRAM N/A 05/02/2012   Procedure: LEFT AND RIGHT HEART CATHETERIZATION WITH CORONARY ANGIOGRAM;  Surgeon: Burnell Blanks, MD;  Location: [facility] CATH LAB;  Service: Cardiovascular;  Laterality: N/A;   RADIOACTIVE SEED GUIDED PARTIAL MASTECTOMY WITH AXILLARY SENTINEL LYMPH NODE BIOPSY Right 03/21/2015   Procedure: RIGHT  PARTIAL MASTECTOMY WITH RADIOACTIVE SEED LOCALIZATION RIGHT  AXILLARY SENTINEL LYMPH NODE BIOPSY;  Surgeon: Fanny Skates, MD;  Location: [place];  Service: General;  Laterality: Right;   REPLACEMENT TOTAL KNEE BILATERAL  2005 &2006   VASCULAR SURGERY Right 03/15/2013   Ultrasound guided sclerotherapy    I have reviewed the social history and family history with  the patient and they are unchanged from previous note.  ALLERGIES:  has No Known Allergies.  MEDICATIONS:  Current Outpatient Medications  Medication Sig Dispense Refill   acetaminophen (TYLENOL) 650 MG CR tablet Take 650 mg by mouth every 8 (eight) hours as needed for pain.     allopurinol (ZYLOPRIM) 100 MG tablet TAKE 1 TABLET BY MOUTH  DAILY 90 tablet 3   atorvastatin (LIPITOR) 40 MG tablet TAKE 1 TABLET BY MOUTH  DAILY 90 tablet 3   BD AUTOSHIELD DUO 30G X 5 MM MISC USE ONCE DAILY TO INJECT  INSULIN AS DIRECTED 90 each 3   blood glucose meter kit and supplies Dispense based on patient and insurance preference.Use to check blood sugar in the morning. (FOR ICD-10 E10.9, E11.9).  Roma Schanz is what she has been using. 1 each 0   Blood Glucose Monitoring Suppl (ONETOUCH VERIO FLEX SYSTEM) w/Device KIT USE AS DIRECTED 1 kit 0   Blood Glucose Monitoring  Suppl (ONETOUCH VERIO) w/Device KIT Use to check blood glucose as directed 1 kit 0   colchicine 0.6 MG tablet TAKE 1 TABLET BY MOUTH  DAILY 90 tablet 1   dorzolamide (TRUSOPT) 2 % ophthalmic solution 1 drop daily.     dorzolamide-timolol (COSOPT) 22.3-6.8 MG/ML ophthalmic solution Place 1 drop into both eyes 2 (two) times daily.  0   doxycycline (VIBRA-TABS) 100 MG tablet Take 1 tablet (100 mg total) by mouth 2 (two) times daily. 14 tablet 0   EASY COMFORT PEN NEEDLES 31G X 8 MM MISC USE TO INJECT INSULIN EVERY DAY AS DIRECTED 100 each 1   FLOVENT HFA 110 MCG/ACT inhaler USE 2 INHALATIONS BY MOUTH  TWICE DAILY 36 g 3   gabapentin (NEURONTIN) 100 MG capsule TAKE 3 CAPSULES BY MOUTH  TWICE DAILY AS NEEDED 360 capsule 3   glucose blood (ONETOUCH VERIO) test strip Use as instructed 100 each 12   glucose blood (ONETOUCH VERIO) test strip CHECK BLOOD SUGAR IN THE  MORNING AS DIRECTED 100 strip 3   insulin glargine (LANTUS) 100 UNIT/ML Solostar Pen Inject 32 units each morning. 15 mL 3   JARDIANCE 25 MG TABS tablet TAKE 1 TABLET BY MOUTH  DAILY  90 tablet 3   Lancet Devices (ONETOUCH DELICA PLUS LANCING) MISC USE ONCE DAILY 100 each 0   latanoprost (XALATAN) 0.005 % ophthalmic solution Place 1 drop into both eyes at bedtime.   0   losartan (COZAAR) 100 MG tablet TAKE 1 TABLET BY MOUTH AT  BEDTIME 90 tablet 3   metoprolol succinate (TOPROL-XL) 25 MG 24 hr tablet TAKE 1 TABLET BY MOUTH  DAILY 90 tablet 3   Miconazole Nitrate (LOTRIMIN AF) 2 % AERO Spray between toes once daily for 4 weeks. 150 g 1   OneTouch Delica Lancets 03T MISC 1 Container by Does not apply route daily as needed. 100 each prn   OZEMPIC, 1 MG/DOSE, 4 MG/3ML SOPN INJECT SUBCUTANEOUSLY 1 MG EVERY WEEK 3 mL 11   spironolactone (ALDACTONE) 25 MG tablet TAKE 1 TABLET BY MOUTH  DAILY 90 tablet 3   No current facility-administered medications for this visit.    PHYSICAL EXAMINATION: ECOG PERFORMANCE STATUS: 0 - Asymptomatic  Vitals:   12/01/21 0944  BP: (!) 158/90  Pulse: 77  Resp: 19  Temp: 97.8 F (36.6 C)  SpO2: 100%   Filed Weights   12/01/21 0944  Weight: 217 lb 14.4 oz (98.8 kg)    GENERAL:alert, no distress and comfortable SKIN: No rash.  Keloids on the upper central chest EYES: sclera clear NECK: Without mass LYMPH:  no palpable cervical or supraclavicular lymphadenopathy LUNGS:  normal breathing effort NEURO: alert & oriented x 3 with fluent speech, no focal motor/sensory deficits Breast exam: Breasts are symmetrical without nipple discharge or inversion.  S/p right lumpectomy, incisions completely healed with moderate scar tissue.  Left breast shows a small open wound in the lower outer quadrant 5 o'clock position with serous drainage, a superficial 1-2 cm nodule at the 630 o'clock position, and a small 0.5 cm nodule at 9:00 likely a comedome or keloid.  There is a small palpable nodule superficially in the left axilla  LABORATORY DATA:  I have reviewed the data as listed CBC Latest Ref Rng & Units 12/01/2021 12/30/2020 12/31/2019  WBC 4.0 - 10.5 K/uL  7.1 10.4 9.8  Hemoglobin 12.0 - 15.0 g/dL 12.1 12.3 12.4  Hematocrit 36.0 - 46.0 % 38.5 40.5 39.7  Platelets 150 - 400  K/uL 144(L) 226 177     CMP Latest Ref Rng & Units 12/01/2021 12/30/2020 08/05/2020  Glucose 70 - 99 mg/dL 201(H) 120(H) 159(H)  BUN 8 - 23 mg/dL 29(H) 21 31(H)  Creatinine 0.44 - 1.00 mg/dL 1.26(H) 1.14(H) 1.16(H)  Sodium 135 - 145 mmol/L 138 140 142  Potassium 3.5 - 5.1 mmol/L 4.1 4.9 4.2  Chloride 98 - 111 mmol/L 103 110 104  CO2 22 - 32 mmol/L 25 20(L) 16(L)  Calcium 8.9 - 10.3 mg/dL 9.4 9.4 9.2  Total Protein 6.5 - 8.1 g/dL 7.8 8.1 7.1  Total Bilirubin 0.3 - 1.2 mg/dL 0.7 0.6 0.4  Alkaline Phos 38 - 126 U/L 114 120 121  AST 15 - 41 U/L 16 27 16   ALT 0 - 44 U/L 13 13 12       RADIOGRAPHIC STUDIES: I have personally reviewed the radiological images as listed and agreed with the findings in the report. No results found.   ASSESSMENT & PLAN: 71 year old female  Left breast/axillary masses Breast cancer of lower-outer quadrant of right breast, invasive ductal carcinoma, pT1c N0 M0, stage IA, ER positive PR positive HER-2 negative, Ki 67 6%, and DCIS -Diagnosed 01/2015, s/p right lumpectomy and SLNB and adjuvant radiation. oncotype showed low risk, no need for adjuvant chemo.  -she completed antiestrogen therapy with exemestane from 05/2015 - 05/2020, tolerated well  -on surveillance  -Mammogram 02/2021 negative  3. Bone health -Her bone density scan in 08/2015 was normal. DEXA from 01/31/18 normal with T Score of -0.9, and decreaesd to -1.7 at forearm radius in 02/2020 -continue calcium and vitamin D. She is off AI.    Disposition: Ms. Hypolite appears stable.  She palpated new nodules in the left breast and axilla about 3 weeks ago, one of which has begun draining and is smaller now.  Exam shows 3 palpable nodules ranging in size from 0.5-2 cm throughout the left breast and axilla with a small open draining wound.  This is likely infectious such as hidradenitis, the  wound drainage was cultured today.  I will start empiric doxycycline 100 mg twice daily for 1 week.  Labs and vitals are reassuring, she is afebrile.  She is being referred for left mammo/ultrasound to exclude recurrent malignancy although my suspicion is low.  Keep previously scheduled follow-up with [name] 12/30/2021 to monitor closely to resolution.  Orders Placed This Encounter  Procedures   WOUND CULTURE    Order Specific Question:   Source    Answer:   left breast inframammary fold   WOUND CULTURE    Order Specific Question:   Source    Answer:   left breast inframammary fold   Aerobic Culture w Gram Stain (superficial specimen)   CBC with Differential (Cancer Center Only)    Standing Status:   Future    Number of Occurrences:   1    Standing Expiration Date:   12/01/2022   All questions were answered. The patient knows to call the clinic with any problems, questions or concerns. No barriers to learning were detected.     Alla Feeling, NP 12/01/21

## 2021-12-04 ENCOUNTER — Other Ambulatory Visit: Payer: Self-pay

## 2021-12-04 ENCOUNTER — Telehealth: Payer: Self-pay

## 2021-12-04 LAB — AEROBIC CULTURE W GRAM STAIN (SUPERFICIAL SPECIMEN): Gram Stain: NONE SEEN

## 2021-12-04 NOTE — Telephone Encounter (Signed)
Spoke with [name] and  pt to coordinate US/Mammo apt. Breast center to call pt to schedule apt. They are aware that imaging to be done by 12/08/21 if possible. Reviewed new prescription for Doxycycline. Patient taking as prescribed. Provided pt with phone number to Breast Center. Advised pt to call with concerns or questions.

## 2021-12-09 ENCOUNTER — Ambulatory Visit: Payer: Medicare Other | Admitting: Pharmacist

## 2021-12-10 ENCOUNTER — Ambulatory Visit (INDEPENDENT_AMBULATORY_CARE_PROVIDER_SITE_OTHER): Payer: Commercial Managed Care - HMO | Admitting: Pharmacist

## 2021-12-10 ENCOUNTER — Other Ambulatory Visit: Payer: Self-pay

## 2021-12-10 ENCOUNTER — Ambulatory Visit
Admission: RE | Admit: 2021-12-10 | Discharge: 2021-12-10 | Disposition: A | Discharge status: Home / Self Care | Payer: Commercial Managed Care - HMO | Source: Ambulatory Visit | Attending: Nurse Practitioner | Admitting: Nurse Practitioner

## 2021-12-10 ENCOUNTER — Other Ambulatory Visit: Payer: Self-pay | Admitting: Nurse Practitioner

## 2021-12-10 DIAGNOSIS — E1169 Type 2 diabetes mellitus with other specified complication: Secondary | ICD-10-CM

## 2021-12-10 DIAGNOSIS — I10 Essential (primary) hypertension: Secondary | ICD-10-CM

## 2021-12-10 DIAGNOSIS — C50511 Malignant neoplasm of lower-outer quadrant of right female breast: Secondary | ICD-10-CM

## 2021-12-10 DIAGNOSIS — N6324 Unspecified lump in the left breast, lower inner quadrant: Secondary | ICD-10-CM

## 2021-12-10 DIAGNOSIS — N6320 Unspecified lump in the left breast, unspecified quadrant: Secondary | ICD-10-CM

## 2021-12-10 DIAGNOSIS — R922 Inconclusive mammogram: Secondary | ICD-10-CM | POA: Diagnosis not present

## 2021-12-10 DIAGNOSIS — M21969 Unspecified acquired deformity of unspecified lower leg: Secondary | ICD-10-CM

## 2021-12-10 DIAGNOSIS — Z170 Estrogen receptor positive status [ER+]: Secondary | ICD-10-CM

## 2021-12-10 MED ORDER — OZEMPIC (2 MG/DOSE) 8 MG/3ML ~~LOC~~ SOPN
2.0000 mg | PEN_INJECTOR | SUBCUTANEOUS | 2 refills | Status: DC
Start: 2021-12-10 — End: 2022-02-08

## 2021-12-10 NOTE — Patient Instructions (Signed)
[name] it was a pleasure seeing you today.   Please do the following:  Increase your Ozempic to 2mg  once weekly as directed today during your appointment. If you have any questions or if you believe something has occurred because of this change, call me or your doctor to let one of [place] know.  Continue checking blood sugars at home. It's really important that you record these and bring these in to your next doctor's appointment.  Continue making the lifestyle changes we've discussed together during our visit. Diet and exercise play a significant role in improving your blood sugars.  Follow-up with me on 2/2    Hypoglycemia or low blood sugar:   Low blood sugar can happen quickly and may become an emergency if not treated right away.   While this shouldn't happen often, it can be brought upon if you skip a meal or do not eat enough. Also, if your insulin or other diabetes medications are dosed too high, this can cause your blood sugar to go to low.   Warning signs of low blood sugar include: Feeling shaky or dizzy Feeling weak or tired  Excessive hunger Feeling anxious or upset  Sweating even when you aren't exercising  What to do if I experience low blood sugar? Follow the Rule of 15 Check your blood sugar with your meter. If lower than 70, proceed to step 2.  Treat with 15 grams of fast acting carbs which is found in 3-4 glucose tablets. If none are available you can try hard candy, 1 tablespoon of sugar or honey,4 ounces of fruit juice, or 6 ounces of REGULAR soda.  Re-check your sugar in 15 minutes. If it is still below 70, do what you did in step 2 again. If your blood sugar has come back up, go ahead and eat a snack or small meal made up of complex carbs (ex. Whole grains) and protein at this time to avoid recurrence of low blood sugar.

## 2021-12-10 NOTE — Progress Notes (Signed)
Subjective:    Patient ID: [name], female    DOB: 07-12-1951, 71 y.o.   MRN: [id number]  HPI Patient is a 71 y.o. female who presents for diabetes management. She is in good spirits and presents with assistance of walker Patient was referred and last seen by Primary Care Provider on 11/11/21. Last seen in pharmacy clinic on 10/14/21.  Patient reports diabetes was diagnosed around 20 years ago .   Insurance coverage/medication affordability: U[employer] Medicare + Medicaid  Current diabetes medications include: Lantus 32 units once daily, jardiance 25mg , Ozempic 1mg  once weekly on Fridays (completed >4 doses) Current hypertension medications include: losartan 100mg , metoprolol succinate 25mg , spironolactone 25mg   Current hyperlipidemia medications include: atorvastatin 40mg  Patient states that She is taking her medications as prescribed. Patient reports adherence with medications. Patient states that She misses her medications 1 time per week, on average.  Do you feel that your medications are working for you?  yes  Have you been experiencing any side effects to the medications prescribed? no  Do you have any problems obtaining medications due to transportation or finances?  no    Patient reported dietary habits:  Eats 2 meals/day and 2 snacks/day Breakfast: skips Lunch: sausage, egg, toast, grits; oatmeal packets Dinner: vegetable, starch, meat Snacks: trail mix; fruit Drinks: water, has reduced her juice and sweet tea consumption    Patient denies hypoglycemic events. Patient reports neuropathy (nerve pain). Patient reports visual changes but just had eye exam and doctor had no concerns Patient reports self foot exams.   Objective:   Home fasting blood sugars: 209, 192, 202, 181, 189, 179, 174, 158  Home blood pressure reading: 140/87 144/90 148/87 148/90 137/83 134/79  Labs:   Physical Exam Neurological:     Mental Status: She is alert and oriented to person,  place, and time.    Review of Systems  Gastrointestinal:  Negative for nausea and vomiting.   Lab Results  Component Value Date   HGBA1C 8.6 (A) 11/11/2021   HGBA1C 8.9 (A) 08/12/2021   HGBA1C 9.2 (A) 05/06/2021    Lipid Panel     Component Value Date/Time   CHOL 97 (L) 01/27/2021 1008   TRIG 223 (H) 01/27/2021 1008   HDL 38 (L) 01/27/2021 1008   CHOLHDL 2.6 01/27/2021 1008   CHOLHDL 5.9 01/07/2016 1635   VLDL UNABLE TO CALCULATE IF TRIGLYCERIDE OVER 400 mg/dL 01/07/2016 1635   LDLCALC 25 01/27/2021 1008   LDLDIRECT 36 03/31/2016 1027    Assessment/Plan:   T2DM is not controlled likely due to sub-optimal diet, however, has improved slightly. Patient tolerating Ozempic 1mg  once weekly well with no reported adverse events therefore will increase to 2mg  once weekly for further glycemic control. Patient with history of intolerance to metformin, even at low dose of 500mg . Patient re-educated on purpose, proper use and potential adverse effects of Ozempic.  Following instruction patient verbalized understanding of treatment plan.    Continued basal insulin Lantus 32 units once daily  Increased dose of  Ozempic to 2 mg once weekly on Fridays Continued SGLT2-I jardiance 25mg  Extensively discussed pathophysiology of diabetes, dietary effects on blood sugar control, and recommended lifestyle interventions. Patient will adhere to dietary modifications of minimizing sugar/carb dense snacks and sugary drinks Counseled on s/sx of and management of hypoglycemia Next A1C anticipated March 2023  Hypertension controlled but needs continued observation -Continued losartan 100mg , metoprolol succinate 25mg , spironolactone 25mg   -Recommended patient continue to check home blood pressure readings and bring  to office visit alongside bringing in physical device for comparison with in office readings -Counseled on lifestyle modifications for blood pressure control including reduced dietary sodium,  increased exercise, adequate sleep.  Follow-up appointment 3 weeks to review sugar readings. Written patient instructions provided.  This appointment required 30 minutes of direct patient care.  Thank you for involving pharmacy to assist in providing this patient's care.

## 2021-12-10 NOTE — Assessment & Plan Note (Signed)
Hypertension controlled but needs continued observation -Continued losartan 100mg , metoprolol succinate 25mg , spironolactone 25mg   -Recommended patient continue to check home blood pressure readings and bring to office visit alongside bringing in physical device for comparison with in office readings -Counseled on lifestyle modifications for blood pressure control including reduced dietary sodium, increased exercise, adequate sleep.

## 2021-12-10 NOTE — Assessment & Plan Note (Signed)
T2DM is not controlled likely due to sub-optimal diet, however, has improved slightly. Patient tolerating Ozempic 1mg  once weekly well with no reported adverse events therefore will increase to 2mg  once weekly for further glycemic control. Patient with history of intolerance to metformin, even at low dose of 500mg . Patient re-educated on purpose, proper use and potential adverse effects of Ozempic.  Following instruction patient verbalized understanding of treatment plan.    1. Continued basal insulin Lantus 32 units once daily  2. Increased dose of  Ozempic to 2 mg once weekly on Fridays 3. Continued SGLT2-I jardiance 25mg  4. Extensively discussed pathophysiology of diabetes, dietary effects on blood sugar control, and recommended lifestyle interventions. 5. Patient will adhere to dietary modifications of minimizing sugar/carb dense snacks and sugary drinks 6. Counseled on s/sx of and management of hypoglycemia 7. Next A1C anticipated March 2023

## 2021-12-13 ENCOUNTER — Telehealth: Payer: Self-pay | Admitting: Nurse Practitioner

## 2021-12-13 NOTE — Telephone Encounter (Signed)
I called to review mammo/US and see how patient is doing. She reports nodules are about the same but she has not started antibiotic yet. She plans to fill and start Monday 1/16.   She has f/up US on 1/20, I will ask my nurse to let breast center know she hasn't started abx yet in the event they want to r/s. Patient is agreeable to that.   She knows to call if she develops enlarging nodules, new skin issues, fever, chills, or other concerns.   She appreciates the call.  Cira Rue, NP

## 2021-12-14 ENCOUNTER — Telehealth: Payer: Self-pay

## 2021-12-14 NOTE — Telephone Encounter (Signed)
Per Cira Rue NP, she instructed this LPN to make breast center aware pt has not started her antibiotic and planned on starting today (12/14/2021) just in case they needed to reschedule follow up US on 12/18/2021. The breast center at [phone] has been called several times today and this LPN is not able to get a hold of anyone. Cira Rue NP has been made aware of this.

## 2021-12-16 ENCOUNTER — Ambulatory Visit: Payer: Medicare Other | Admitting: Pharmacist

## 2021-12-18 ENCOUNTER — Other Ambulatory Visit: Payer: Commercial Managed Care - HMO

## 2021-12-22 ENCOUNTER — Other Ambulatory Visit: Payer: Self-pay

## 2021-12-22 NOTE — Progress Notes (Signed)
Pt called to update her home address.

## 2021-12-30 ENCOUNTER — Encounter: Payer: Self-pay | Admitting: Hematology

## 2021-12-30 ENCOUNTER — Inpatient Hospital Stay: Payer: Medicare Other | Attending: Hematology | Admitting: Hematology

## 2021-12-30 ENCOUNTER — Inpatient Hospital Stay: Payer: Medicare Other

## 2021-12-30 ENCOUNTER — Other Ambulatory Visit: Payer: Self-pay

## 2021-12-30 VITALS — BP 148/82 | HR 73 | Temp 98.4°F | Resp 18 | Ht 62.0 in | Wt 217.2 lb

## 2021-12-30 DIAGNOSIS — Z79899 Other long term (current) drug therapy: Secondary | ICD-10-CM | POA: Diagnosis not present

## 2021-12-30 DIAGNOSIS — Z853 Personal history of malignant neoplasm of breast: Secondary | ICD-10-CM | POA: Insufficient documentation

## 2021-12-30 DIAGNOSIS — C50511 Malignant neoplasm of lower-outer quadrant of right female breast: Secondary | ICD-10-CM

## 2021-12-30 DIAGNOSIS — Z170 Estrogen receptor positive status [ER+]: Secondary | ICD-10-CM

## 2021-12-30 DIAGNOSIS — N6332 Unspecified lump in axillary tail of the left breast: Secondary | ICD-10-CM | POA: Diagnosis not present

## 2021-12-30 LAB — CMP (CANCER CENTER ONLY)
ALT: 14 U/L (ref 0–44)
AST: 18 U/L (ref 15–41)
Albumin: 3.7 g/dL (ref 3.5–5.0)
Alkaline Phosphatase: 103 U/L (ref 38–126)
Anion gap: 5 (ref 5–15)
BUN: 19 mg/dL (ref 8–23)
CO2: 27 mmol/L (ref 22–32)
Calcium: 8.9 mg/dL (ref 8.9–10.3)
Chloride: 108 mmol/L (ref 98–111)
Creatinine: 1.16 mg/dL — ABNORMAL HIGH (ref 0.44–1.00)
GFR, Estimated: 50 mL/min — ABNORMAL LOW (ref >60)
Glucose, Bld: 158 mg/dL — ABNORMAL HIGH (ref 70–99)
Potassium: 4.2 mmol/L (ref 3.5–5.1)
Sodium: 140 mmol/L (ref 135–145)
Total Bilirubin: 0.6 mg/dL (ref 0.3–1.2)
Total Protein: 6.8 g/dL (ref 6.5–8.1)

## 2021-12-30 NOTE — Progress Notes (Signed)
[facility]   [phone] [phone]   Clinic Follow up Note   Patient Care Team: Lind Covert, MD as PCP - General (Family Medicine) Gevena Cotton, MD (Ophthalmology) Rana Snare, MD (Inactive) (Urology) Dr. Edwin Cap (Dental General Practice) Fanny Skates, MD as Consulting Physician (General Surgery) Holley Bouche, NP (Inactive) as Nurse Practitioner (Nurse Practitioner) Truitt Merle, MD as Consulting Physician (Hematology) Gardiner Barefoot, DPM (Podiatry) Pleasant, Eppie Gibson, RN as [facility] Chambliss, Jeb Levering, MD (Family Medicine)  Date of Service:  12/30/2021  CHIEF COMPLAINT: f/u of right breast cancer  CURRENT THERAPY:  Surveillance  ASSESSMENT & PLAN:  [name] is a 71 y.o. female with   1. Left breast/axillary masses -palpated in 10/2021, one of which began draining. -physical exam 12/01/21 by NP Lacie showed 3 palpable nodules ranging from 0.5-2 cm throughout left breast and axilla with a small open draining wound. Wound culture was negative. -left diagnostic MM and [place] on 12/10/21 showed a draining collection within the skin, possibly representing an infected sebaceous cyst.  -she was given a course of doxycycline, which she reports she finished in Jan  -she reports the draining has resolved and the lumps have shrunk. -exam showed no palpable breast or axillary mass, skin wound has healed well, no concerns for malignancy  -She is scheduled for f/u left [place] on 01/13/22.  2. Breast cancer of lower-outer quadrant of right breast, invasive ductal carcinoma and DCIS, pT1c N0 M0, stage IA, ER+/PR+/HER-2- -Diagnosed 01/2015, s/p right lumpectomy and SLNB and adjuvant radiation. Oncotype showed low risk. She completed antiestrogen therapy with exemestane from 05/2015 - 05/2020, tolerated well  -she is clinically doing well. She has some right arm lymphedema on exam today, which she reports has  been present since the surgery. Labs reviewed, CMP only was performed today, results pending. -on surveillance  -mammograms annually in April   3. Bone health -DEXA from 01/31/18 normal with T Score of -0.9, and decreased to -1.7 at forearm radius in 02/2020 -she is taking vitamin D. She is off AI now.    PLAN: -proceed with left breast US on 01/13/22 as scheduled  -mammogram and DEXA due 02/2022, I ordered today  -lab and f/u in 1 year, continue breast cancer surveillance   No problem-specific Assessment & Plan notes found for this encounter.   SUMMARY OF ONCOLOGIC HISTORY: Oncology History Overview Note    Breast cancer of lower-outer quadrant of right female breast   Staging form: Breast, AJCC 7th Edition     Clinical stage from 02/19/2015: Stage IA (T1b, N0, M0) - Unsigned       Staging comments: Staged at breast conference on 3.23.16      Pathologic stage from 03/24/2015: Stage IA (T1c, N0, cM0) - Signed by Enid Cutter, MD on 03/31/2015       Staging comments: Staged on final lumpectomy specimen by Dr. Donato Heinz.    Breast cancer of lower-outer quadrant of right female breast ([place])  01/23/2015 Mammogram   Possible mass in right breast warrants further evaluation.  No findings in the left breast suspicious for malignancy   02/03/2015 Breast US   Right breast: 5 x 5 x 5 mm irregular hypoechoic mass with internal color vascularity at 6 o'clock position, 4 cm from the nipple   02/10/2015 Initial Biopsy   Right needle core bx LOQ: Invasive ductal carcinoma, grade 1, ER+ (100%), PR+ (60%), HER2/neu negative (ratio 1.13), Ki67 3%; DCIS.  02/12/2015 Breast MRI   Biopsy-proven malignancy in the central right breast at posterior depth. No MR findings to suggest multicentric or contralateral malignancy.   02/12/2015 Clinical Stage   Stage IA: T1b N0   03/21/2015 Definitive Surgery   Right breast lumpectomy / SLNB Dalbert Batman): Invasive ductal carcinoma, grade 1, 1.1 cm, ER+ (100%), PR+ (73%),  HER-2 negative (ratio 1.3), Ki67 6%, negative margins / no lymphovascular invasion; DCIS.  One lymph node negative for tumor (0/1).     03/21/2015 Oncotype testing   RS 7, low risk. (ROR 5% for 10-year recurrence with Tamoxifen alone).    03/21/2015 Pathologic Stage   Stage IA: pT1c pN0   05/07/2015 - 06/23/2015 Radiation Therapy   Adjuvant RT completed Pablo Ledger): Right breast 45 Gy over 25 fractions.   Right breast boost 16 Gy over 8 fractions. Total dose: 60 Gy.   06/02/2015 - 05/2020 Anti-estrogen oral therapy   Aromasin 25 mg once daily Burr Medico).  Planned duration of therapy: 5 years, completed in 05/2020.    08/15/2015 Survivorship   Survivorship visit completed and copy of survivorship care plan provided to patient.   01/31/2018 Mammogram   IMPRESSION: No mammographic evidence for malignancy.      INTERVAL HISTORY:  [name] is here for a follow up of breast cancer. She was last seen by NP Lacie on 12/01/21. She presents to the clinic alone. She reports the left breast lumps are getting smaller and is no longer draining. She notes she lives with her daughter and granddaughter now.   All other systems were reviewed with the patient and are negative.  MEDICAL HISTORY:  Past Medical History:  Diagnosis Date   Anemia    Arthritis    Asthma    Blood transfusion without reported diagnosis    Patients believes 2015 when she overdosed on "medications"    Breast cancer ([place])    Breast cancer of lower-outer quadrant of right female breast ([place]) 02/13/2015   Cataract    CHF, acute ([place]) 05/03/2012   Depression    Diabetes mellitus    Eczema    Fatty liver    Fatty infiltration of liver noted on 03/2012 CT scan   Fibromyalgia    Glaucoma    Heart disease    History of kidney stones    w/ hx of hydronephrosis - followed by [facility]   HIV nonspecific serology    2006: indeterminate HIV blood test, seen by ID, felt secondary to cross reacting antibodies with no further  workup felt necessary at that time    Hyperlipidemia    Hypertension    Obesity    Personal history of radiation therapy 2016   right   Radiation 05/07/15-06/23/15   Right Breast    SURGICAL HISTORY: Past Surgical History:  Procedure Laterality Date   BREAST BIOPSY Left 02/10/2015   malignant    BREAST LUMPECTOMY Right 03/21/2015   CHOLECYSTECTOMY  2003   CYSTOSCOPY W/ LITHOLAPAXY / EHL     JOINT REPLACEMENT     bilateral knee replacement   LEFT AND RIGHT HEART CATHETERIZATION WITH CORONARY ANGIOGRAM N/A 05/02/2012   Procedure: LEFT AND RIGHT HEART CATHETERIZATION WITH CORONARY ANGIOGRAM;  Surgeon: Burnell Blanks, MD;  Location: [facility] CATH LAB;  Service: Cardiovascular;  Laterality: N/A;   RADIOACTIVE SEED GUIDED PARTIAL MASTECTOMY WITH AXILLARY SENTINEL LYMPH NODE BIOPSY Right 03/21/2015   Procedure: RIGHT  PARTIAL MASTECTOMY WITH RADIOACTIVE SEED LOCALIZATION RIGHT  AXILLARY SENTINEL LYMPH NODE BIOPSY;  Surgeon: Renelda Loma  Dalbert Batman, MD;  Location: [facility];  Service: General;  Laterality: Right;   REPLACEMENT TOTAL KNEE BILATERAL  2005 &2006   VASCULAR SURGERY Right 03/15/2013   Ultrasound guided sclerotherapy    I have reviewed the social history and family history with the patient and they are unchanged from previous note.  ALLERGIES:  has No Known Allergies.  MEDICATIONS:  Current Outpatient Medications  Medication Sig Dispense Refill   acetaminophen (TYLENOL) 650 MG CR tablet Take 650 mg by mouth every 8 (eight) hours as needed for pain.     allopurinol (ZYLOPRIM) 100 MG tablet TAKE 1 TABLET BY MOUTH  DAILY 90 tablet 3   atorvastatin (LIPITOR) 40 MG tablet TAKE 1 TABLET BY MOUTH  DAILY 90 tablet 3   BD AUTOSHIELD DUO 30G X 5 MM MISC USE ONCE DAILY TO INJECT  INSULIN AS DIRECTED 90 each 3   blood glucose meter kit and supplies Dispense based on patient and insurance preference.Use to check blood sugar in the morning. (FOR ICD-10 E10.9, E11.9).  Roma Schanz  is what she has been using. 1 each 0   Blood Glucose Monitoring Suppl (ONETOUCH VERIO FLEX SYSTEM) w/Device KIT USE AS DIRECTED 1 kit 0   Blood Glucose Monitoring Suppl (ONETOUCH VERIO) w/Device KIT Use to check blood glucose as directed 1 kit 0   colchicine 0.6 MG tablet TAKE 1 TABLET BY MOUTH  DAILY 90 tablet 1   dorzolamide (TRUSOPT) 2 % ophthalmic solution 1 drop daily.     dorzolamide-timolol (COSOPT) 22.3-6.8 MG/ML ophthalmic solution Place 1 drop into both eyes 2 (two) times daily.  0   doxycycline (VIBRA-TABS) 100 MG tablet Take 1 tablet (100 mg total) by mouth 2 (two) times daily. (Patient not taking: Reported on 12/10/2021) 14 tablet 0   EASY COMFORT PEN NEEDLES 31G X 8 MM MISC USE TO INJECT INSULIN EVERY DAY AS DIRECTED 100 each 1   FLOVENT HFA 110 MCG/ACT inhaler USE 2 INHALATIONS BY MOUTH  TWICE DAILY 36 g 3   gabapentin (NEURONTIN) 100 MG capsule TAKE 3 CAPSULES BY MOUTH  TWICE DAILY AS NEEDED 360 capsule 3   glucose blood (ONETOUCH VERIO) test strip Use as instructed 100 each 12   glucose blood (ONETOUCH VERIO) test strip CHECK BLOOD SUGAR IN THE  MORNING AS DIRECTED 100 strip 3   insulin glargine (LANTUS) 100 UNIT/ML Solostar Pen Inject 32 units each morning. 15 mL 3   JARDIANCE 25 MG TABS tablet TAKE 1 TABLET BY MOUTH  DAILY 90 tablet 3   Lancet Devices (ONETOUCH DELICA PLUS LANCING) MISC USE ONCE DAILY 100 each 0   latanoprost (XALATAN) 0.005 % ophthalmic solution Place 1 drop into both eyes at bedtime.   0   losartan (COZAAR) 100 MG tablet TAKE 1 TABLET BY MOUTH AT  BEDTIME 90 tablet 3   metoprolol succinate (TOPROL-XL) 25 MG 24 hr tablet TAKE 1 TABLET BY MOUTH  DAILY 90 tablet 3   Miconazole Nitrate (LOTRIMIN AF) 2 % AERO Spray between toes once daily for 4 weeks. 150 g 1   OneTouch Delica Lancets 78G MISC 1 Container by Does not apply route daily as needed. 100 each prn   Semaglutide, 2 MG/DOSE, (OZEMPIC, 2 MG/DOSE,) 8 MG/3ML SOPN Inject 2 mg into the skin once a week.  (Patient not taking: Reported on 12/10/2021) 3 mL 2   spironolactone (ALDACTONE) 25 MG tablet TAKE 1 TABLET BY MOUTH  DAILY 90 tablet 3   No current facility-administered medications  for this visit.    PHYSICAL EXAMINATION: ECOG PERFORMANCE STATUS: 1 - Symptomatic but completely ambulatory  Vitals:   12/30/21 1114  BP: (!) 148/82  Pulse: 73  Resp: 18  Temp: 98.4 F (36.9 C)  SpO2: 100%   Wt Readings from Last 3 Encounters:  12/30/21 217 lb 3.2 oz (98.5 kg)  12/10/21 212 lb 6.4 oz (96.3 kg)  12/01/21 217 lb 14.4 oz (98.8 kg)     GENERAL:alert, no distress and comfortable SKIN: skin color, texture, turgor are normal, no rashes or significant lesions EYES: normal, Conjunctiva are pink and non-injected, sclera clear  NECK: supple, thyroid normal size, non-tender, without nodularity LYMPH:  no palpable lymphadenopathy in the cervical, axillary  LUNGS: clear to auscultation and percussion with normal breathing effort HEART: regular rate & rhythm and no murmurs and no lower extremity edema ABDOMEN:abdomen soft, non-tender and normal bowel sounds Musculoskeletal:no cyanosis of digits and no clubbing  NEURO: alert & oriented x 3 with fluent speech, no focal motor/sensory deficits BREAST: left breast well. No palpable mass, nodules or adenopathy bilaterally. Breast exam benign.   LABORATORY DATA:  I have reviewed the data as listed CBC Latest Ref Rng & Units 12/01/2021 12/30/2020 12/31/2019  WBC 4.0 - 10.5 K/uL 7.1 10.4 9.8  Hemoglobin 12.0 - 15.0 g/dL 12.1 12.3 12.4  Hematocrit 36.0 - 46.0 % 38.5 40.5 39.7  Platelets 150 - 400 K/uL 144(L) 226 177     CMP Latest Ref Rng & Units 12/30/2021 12/01/2021 12/30/2020  Glucose 70 - 99 mg/dL 158(H) 201(H) 120(H)  BUN 8 - 23 mg/dL 19 29(H) 21  Creatinine 0.44 - 1.00 mg/dL 1.16(H) 1.26(H) 1.14(H)  Sodium 135 - 145 mmol/L 140 138 140  Potassium 3.5 - 5.1 mmol/L 4.2 4.1 4.9  Chloride 98 - 111 mmol/L 108 103 110  CO2 22 - 32 mmol/L 27 25 20(L)   Calcium 8.9 - 10.3 mg/dL 8.9 9.4 9.4  Total Protein 6.5 - 8.1 g/dL 6.8 7.8 8.1  Total Bilirubin 0.3 - 1.2 mg/dL 0.6 0.7 0.6  Alkaline Phos 38 - 126 U/L 103 114 120  AST 15 - 41 U/L _0 ALT 0 - 44 U/L _1 RADIOGRAPHIC STUDIES: I have personally reviewed the radiological images as listed and agreed with the findings in the report. No results found.    Orders Placed This Encounter  Procedures   MM Digital Screening    Standing Status:   Future    Standing Expiration Date:   12/30/2022    Order Specific Question:   Reason for Exam (SYMPTOM  OR DIAGNOSIS REQUIRED)    Answer:   screening    Order Specific Question:   Preferred imaging location?    Answer:   [facility]   DG Bone Density    Standing Status:   Future    Standing Expiration Date:   12/30/2022    Order Specific Question:   Reason for Exam (SYMPTOM  OR DIAGNOSIS REQUIRED)    Answer:   screnning    Order Specific Question:   Preferred imaging location?    Answer:   [facility]   All questions were answered. The patient knows to call the clinic with any problems, questions or concerns. No barriers to learning was detected. The total time spent in the appointment was 25 minutes.     Truitt Merle, MD 12/30/2021   I, Wilburn Mylar, am acting as scribe for Truitt Merle, MD.  I have reviewed the above documentation for accuracy and completeness, and I agree with the above.

## 2021-12-31 ENCOUNTER — Ambulatory Visit (INDEPENDENT_AMBULATORY_CARE_PROVIDER_SITE_OTHER): Payer: Medicare Other | Admitting: Pharmacist

## 2021-12-31 ENCOUNTER — Other Ambulatory Visit: Payer: Self-pay

## 2021-12-31 DIAGNOSIS — M21969 Unspecified acquired deformity of unspecified lower leg: Secondary | ICD-10-CM

## 2021-12-31 DIAGNOSIS — E1169 Type 2 diabetes mellitus with other specified complication: Secondary | ICD-10-CM

## 2021-12-31 DIAGNOSIS — I10 Essential (primary) hypertension: Secondary | ICD-10-CM

## 2021-12-31 NOTE — Patient Instructions (Signed)
Miss Abundis it was a pleasure seeing you today.   Please do the following:  Continue all your medications as directed today during your appointment. If you have any questions or if you believe something has occurred because of this change, call me or your doctor to let one of [place] know.  Continue checking blood sugars at home. It's really important that you record these and bring these in to your next doctor's appointment.  Continue making the lifestyle changes we've discussed together during our visit. Diet and exercise play a significant role in improving your blood sugars.  Follow-up with [name] next month.    Hypoglycemia or low blood sugar:   Low blood sugar can happen quickly and may become an emergency if not treated right away.   While this shouldn't happen often, it can be brought upon if you skip a meal or do not eat enough. Also, if your insulin or other diabetes medications are dosed too high, this can cause your blood sugar to go to low.   Warning signs of low blood sugar include: Feeling shaky or dizzy Feeling weak or tired  Excessive hunger Feeling anxious or upset  Sweating even when you aren't exercising  What to do if I experience low blood sugar? Follow the Rule of 15 Check your blood sugar with your meter. If lower than 70, proceed to step 2.  Treat with 15 grams of fast acting carbs which is found in 3-4 glucose tablets. If none are available you can try hard candy, 1 tablespoon of sugar or honey,4 ounces of fruit juice, or 6 ounces of REGULAR soda.  Re-check your sugar in 15 minutes. If it is still below 70, do what you did in step 2 again. If your blood sugar has come back up, go ahead and eat a snack or small meal made up of complex carbs (ex. Whole grains) and protein at this time to avoid recurrence of low blood sugar.

## 2021-12-31 NOTE — Assessment & Plan Note (Signed)
T2DM is not controlled likely due to sub-optimal diet, however, has improved. Discussed with patient variation in fasting hyperglycemia is likely due to dietary choices, to which patient is agreeable and understands. Patient will work towards continuing to improve dietary habits. Will continue patient on current regimen and obtain A1C next month before deciding any further adjustments needed. Patient currently on maximum dose Ozempic and Jardiance. Patient with history of intolerance to metformin, even at low dose of 500mg .  Following instruction patient verbalized understanding of treatment plan.    1. Continued basal insulin Lantus 32 units once daily  2. Continued  Ozempic to 2 mg once weekly on Fridays 3. Continued SGLT2-I jardiance 25mg  4. Extensively discussed pathophysiology of diabetes, dietary effects on blood sugar control, and recommended lifestyle interventions. 5. Patient will adhere to dietary modifications of minimizing sugar/carb dense snacks and sugary drinks 6. Counseled on s/sx of and management of hypoglycemia 7. Next A1C anticipated March 2023

## 2021-12-31 NOTE — Assessment & Plan Note (Signed)
Hypertension borderline controlled and needs continued observation -Continued losartan 100mg , metoprolol succinate 25mg , spironolactone 25mg   -Recommended patient continue to check home blood pressure readings -Counseled on lifestyle modifications for blood pressure control including reduced dietary sodium, increased exercise, adequate sleep.

## 2021-12-31 NOTE — Progress Notes (Signed)
Subjective:    Patient ID: [name], female    DOB: Oct 16, 1951, 71 y.o.   MRN: [id number]  HPI Patient is a 71 y.o. female who presents for diabetes management. She is in good spirits and presents with assistance of walker Patient was referred and last seen by Primary Care Provider on 11/11/21. Last seen in pharmacy clinic on 12/10/21.  Patient reports she has noticed an improvement in her blood glucose readings since increasing her Ozempic to 2mg  once weekly a week ago. She states she has had some slight nausea but that this is tolerable for her.   Patient just moved with her daughter and granddaughter  Patient reports diabetes was diagnosed around 20 years ago .   Insurance coverage/medication affordability: UHC Medicare + Medicaid  Current diabetes medications include: Lantus 32 units once daily, jardiance 25mg , Ozempic 2mg  once weekly on Fridays (completed 1 dose) Current hypertension medications include: losartan 100mg , metoprolol succinate 25mg , spironolactone 25mg   Current hyperlipidemia medications include: atorvastatin 40mg  Patient states that She is taking her medications as prescribed. Patient reports adherence with medications. Patient states that She misses her medications 1 time per week, on average.  Do you feel that your medications are working for you?  yes  Have you been experiencing any side effects to the medications prescribed? no  Do you have any problems obtaining medications due to transportation or finances?  no    Patient reported dietary habits:  Eats 2 meals/day and 2 snacks/day Breakfast: skips Lunch: sausage, egg, toast, grits; oatmeal packets Dinner: vegetable, starch, meat Snacks: trail mix; fruit Drinks: water, has reduced her juice and sweet tea consumption    Patient denies hypoglycemic events. Patient denies polyuria (increased urination) Patient reports polyphagia (increased appetite) Patient reports polydipsia (increased  thirst) Patient reports neuropathy (nerve pain). Patient reports visual changes but just had eye exam and doctor had no concerns Patient reports self foot exams.   Objective:   Home fasting blood sugars: 118, 110, 128, 123, 135, 183, 199  Home blood pressure reading: 139/80 138/78 141/84 140/80 137/77 136/76 134/76  Labs:   Physical Exam Neurological:     Mental Status: She is alert and oriented to person, place, and time.    Review of Systems  Gastrointestinal:  Positive for nausea. Negative for vomiting.   Lab Results  Component Value Date   HGBA1C 8.6 (A) 11/11/2021   HGBA1C 8.9 (A) 08/12/2021   HGBA1C 9.2 (A) 05/06/2021    Lipid Panel     Component Value Date/Time   CHOL 97 (L) 01/27/2021 1008   TRIG 223 (H) 01/27/2021 1008   HDL 38 (L) 01/27/2021 1008   CHOLHDL 2.6 01/27/2021 1008   CHOLHDL 5.9 01/07/2016 1635   VLDL UNABLE TO CALCULATE IF TRIGLYCERIDE OVER 400 mg/dL 01/07/2016 1635   LDLCALC 25 01/27/2021 1008   LDLDIRECT 36 03/31/2016 1027    Assessment/Plan:   T2DM is not controlled likely due to sub-optimal diet, however, has improved. Discussed with patient variation in fasting hyperglycemia is likely due to dietary choices, to which patient is agreeable and understands. Patient will work towards continuing to improve dietary habits. Will continue patient on current regimen and obtain A1C next month before deciding any further adjustments needed. Patient currently on maximum dose Ozempic and Jardiance. Patient with history of intolerance to metformin, even at low dose of 500mg .  Following instruction patient verbalized understanding of treatment plan.    Continued basal insulin Lantus 32 units once daily  Continued  Ozempic to 2 mg once weekly on Fridays Continued SGLT2-I jardiance 25mg  Extensively discussed pathophysiology of diabetes, dietary effects on blood sugar control, and recommended lifestyle interventions. Patient will adhere to dietary  modifications of minimizing sugar/carb dense snacks and sugary drinks Counseled on s/sx of and management of hypoglycemia Next A1C anticipated March 2023  Hypertension borderline controlled and needs continued observation -Continued losartan 100mg , metoprolol succinate 25mg , spironolactone 25mg   -Recommended patient continue to check home blood pressure readings -Counseled on lifestyle modifications for blood pressure control including reduced dietary sodium, increased exercise, adequate sleep.  Follow-up appointment 4 weeks to review sugar readings and blood pressure. Written patient instructions provided.  This appointment required 25 minutes of direct patient care.  Thank you for involving pharmacy to assist in providing this patient's care.

## 2022-01-07 ENCOUNTER — Other Ambulatory Visit: Payer: Self-pay | Admitting: Family Medicine

## 2022-01-13 ENCOUNTER — Ambulatory Visit
Admission: RE | Admit: 2022-01-13 | Discharge: 2022-01-13 | Disposition: A | Discharge status: Home / Self Care | Payer: Medicare Other | Source: Ambulatory Visit | Attending: Nurse Practitioner | Admitting: Nurse Practitioner

## 2022-01-13 DIAGNOSIS — C50511 Malignant neoplasm of lower-outer quadrant of right female breast: Secondary | ICD-10-CM

## 2022-01-13 DIAGNOSIS — Z170 Estrogen receptor positive status [ER+]: Secondary | ICD-10-CM

## 2022-01-13 DIAGNOSIS — N6320 Unspecified lump in the left breast, unspecified quadrant: Secondary | ICD-10-CM

## 2022-01-13 DIAGNOSIS — N644 Mastodynia: Secondary | ICD-10-CM | POA: Diagnosis not present

## 2022-01-27 ENCOUNTER — Other Ambulatory Visit: Payer: Self-pay

## 2022-01-27 ENCOUNTER — Encounter: Payer: Self-pay | Admitting: Podiatry

## 2022-01-27 ENCOUNTER — Ambulatory Visit (INDEPENDENT_AMBULATORY_CARE_PROVIDER_SITE_OTHER): Payer: Medicare Other | Admitting: Podiatry

## 2022-01-27 DIAGNOSIS — E1140 Type 2 diabetes mellitus with diabetic neuropathy, unspecified: Secondary | ICD-10-CM

## 2022-01-27 DIAGNOSIS — B351 Tinea unguium: Secondary | ICD-10-CM | POA: Diagnosis not present

## 2022-01-27 DIAGNOSIS — M2010 Hallux valgus (acquired), unspecified foot: Secondary | ICD-10-CM | POA: Diagnosis not present

## 2022-01-27 NOTE — Progress Notes (Addendum)
This patient returns to my office for at risk foot care.  This patient requires this care by a professional since this patient will be at risk due to having diabetes.   This patient is unable to cut nails herself since the patient cannot reach her nails.These nails are painful walking and wearing shoes.  This patient presents for at risk foot care today.  General Appearance  Alert, conversant and in no acute stress.  Vascular  Dorsalis pedis and posterior tibial  pulses are palpable  bilaterally.  Capillary return is within normal limits  bilaterally. Temperature is within normal limits  bilaterally.  Neurologic  Senn-Weinstein monofilament wire test absent   bilaterally. Muscle power within normal limits bilaterally.  Nails Thick disfigured discolored nails with subungual debris  from hallux to fifth toes bilaterally. No evidence of bacterial infection or drainage bilaterally.  Orthopedic  No limitations of motion  feet .  No crepitus or effusions noted.  No bony pathology or digital deformities noted. Asymptomatic  HAV  B/L.  Pes planus  B/L. Hammer toes second left foot.  Skin  normotropic skin with no porokeratosis noted bilaterally.  No signs of infections or ulcers noted.     Onychomycosis  Pain in right toes  Pain in left toes    Consent was obtained for treatment procedures.   Mechanical debridement of nails 1-5  bilaterally performed with a nail nipper.  Filed with dremel without incident. Patient requests diabetic shoes.  She has absent LOPS and HAV  B/L.   MD is Dr.  Erin Hearing.   Return office visit  3 months                    Told patient to return for periodic foot care and evaluation due to potential at risk complications   Gardiner Barefoot DPM

## 2022-02-01 ENCOUNTER — Encounter: Payer: Self-pay | Admitting: Family Medicine

## 2022-02-01 ENCOUNTER — Ambulatory Visit (INDEPENDENT_AMBULATORY_CARE_PROVIDER_SITE_OTHER): Payer: Medicare Other | Admitting: Family Medicine

## 2022-02-01 ENCOUNTER — Other Ambulatory Visit: Payer: Self-pay

## 2022-02-01 ENCOUNTER — Other Ambulatory Visit: Payer: Self-pay | Admitting: *Deleted

## 2022-02-01 VITALS — BP 138/76 | HR 77 | Ht 62.0 in | Wt 216.0 lb

## 2022-02-01 DIAGNOSIS — E1169 Type 2 diabetes mellitus with other specified complication: Secondary | ICD-10-CM

## 2022-02-01 DIAGNOSIS — M21969 Unspecified acquired deformity of unspecified lower leg: Secondary | ICD-10-CM

## 2022-02-01 DIAGNOSIS — I10 Essential (primary) hypertension: Secondary | ICD-10-CM

## 2022-02-01 LAB — POCT GLYCOSYLATED HEMOGLOBIN (HGB A1C): HbA1c, POC (controlled diabetic range): 7.9 % — AB (ref 0.0–7.0)

## 2022-02-01 MED ORDER — INSULIN GLARGINE 100 UNIT/ML SOLOSTAR PEN: PEN_INJECTOR | SUBCUTANEOUS | 3 refills | Status: DC

## 2022-02-01 MED ORDER — ONETOUCH DELICA LANCETS 30G MISC: 1.0000 | Freq: Every day | 99 refills | Status: DC | PRN

## 2022-02-01 NOTE — Assessment & Plan Note (Addendum)
Improving with current regimen.  Would continue.  May increase Ozempic next visit if not losing weight  ?

## 2022-02-01 NOTE — Patient Instructions (Signed)
Good to see you today - Thank you for coming in ? ?Things we discussed today: ? ?You need an diabetes eye exam every year.  Please see your eye doctor.  Ask them to fax [place] a report of your exam  ? ?I sent a prescription to your pharmacy for your Zoster vaccines to help prevent Shingles.  The shot may cause a sore arm and mild flu like symptoms for a few days.  You will need a second shot 2 months after your first.   ? ?Please always bring your medication bottles ? ?Come back to see me in 3 month  ? ?Happy Birthday  ?

## 2022-02-01 NOTE — Assessment & Plan Note (Signed)
BP Readings from Last 3 Encounters:  ?02/01/22 138/76  ?12/31/21 139/76  ?12/30/21 (!) 148/82  ? ?At goal today.  Continue current medications  ?

## 2022-02-01 NOTE — Patient Instructions (Signed)
Visit Information ? ?Thank you for taking time to visit with me today. Please don't hesitate to contact me if I can be of assistance to you before our next scheduled telephone appointment. ? ?Following are the goals we discussed today:  ?Current Barriers:  ?Knowledge Deficits related to plan of care for management of DMII  ? ?RNCM Clinical Goal(s):  ?Patient will verbalize understanding of plan for management of DMII as evidenced by continuation of monitoring blood sugars and adhering to diabetic diet through collaboration with RN Care manager, provider, and care team.  ? ?Interventions: ?Inter-disciplinary care team collaboration (see longitudinal plan of care) ?Evaluation of current treatment plan related to  self management and patient's adherence to plan as established by provider ? ? ?Diabetes Interventions:  (Status:  Goal on track:  Yes.) Long Term Goal ?Assessed patient's understanding of A1c goal: <7% ?Provided education to patient about basic DM disease process ?Counseled on importance of regular laboratory monitoring as prescribed ?Advised patient, providing education and rationale, to check cbg daily and record, calling Dr for findings outside established parameters ?Lab Results  ?Component Value Date  ? HGBA1C 7.9 (A) 02/01/2022  ? ? ?Patient Goals/Self-Care Activities: ?Take all medications as prescribed ?Attend all scheduled provider appointments ?Call pharmacy for medication refills 3-7 days in advance of running out of medications ?Perform all self care activities independently  ?Perform IADL's (shopping, preparing meals, housekeeping, managing finances) independently ?Call provider office for new concerns or questions  ?call the Suicide and Crisis Lifeline: 988 if experiencing a Mental Health or [place]  ?keep appointment with eye doctor ?schedule appointment with eye doctor ?check blood sugar at prescribed times: once daily ?check feet daily for cuts, sores or redness ?take the  blood sugar log to all doctor visits ?trim toenails straight across ?drink 6 to 8 glasses of water each day ?manage portion size ?wash and dry feet carefully every day ?wear comfortable, cotton socks ?wear comfortable, well-fitting shoes ? ?Follow Up Plan:  Telephone follow up appointment with care management team member scheduled for:  June 2023 ?The patient has been provided with contact information for the care management team and has been advised to call with any health related questions or concerns.   ? ?[id number]  Patient A1C 7.6 decreased from 8.6. Per patient she monitoring her diet. Patient is monitoring her blood sugar daily.  BP is controlled.  ? ?Our next appointment is by telephone on May 04, 2022 ? ?Please call Johny Shock at [phone] if you need to cancel or reschedule your appointment.  ? ?Please call the Suicide and Crisis Lifeline: 988 if you are experiencing a Mental Health or [place] or need someone to talk to. ? ?Patient verbalizes understanding of instructions and care plan provided today and agrees to view in [place]. Active MyChart status confirmed with patient.   ? ?Telephone follow up appointment with care management team member scheduled for: ?The patient has been provided with contact information for the care management team and has been advised to call with any health related questions or concerns.  ? ?SIGNATURE ? ?Johny Shock BSN RN ?[place] [facility] ?[phone] ? ? ?  ?

## 2022-02-01 NOTE — Patient Outreach (Signed)
[facility]) Care Management [place] Note   02/01/6293 Name:  [name] MRN:  [id number] DOB:  10/21/1951  Summary: Patient A1C is 7.6 down from 8.6. Per patient she is eating better.  Patient blood pressure is better controlled.   Recommendations/Changes made from today's visit: Schedule eye exam Monitor blood sugar Adhere to diet Medication adherence  Subjective: [name] is an 71 y.o. year old female who is a primary patient of Chambliss, Jeb Levering, MD. The care management team was consulted for assistance with care management and/or care coordination needs.    RN Health Coach completed Telephone Visit today.   Objective:  Medications Reviewed Today     Reviewed by Lind Covert, MD (Physician) on 02/01/22 at [id number]  Med List Status: <None>   Medication Order Taking? Sig Documenting Provider Last Dose Status Informant  acetaminophen (TYLENOL) 650 MG CR tablet [id number] Yes Take 650 mg by mouth every 8 (eight) hours as needed for pain. [provider] Taking Active Self           Med Note Valentina Lucks, Monico Blitz   Tue Aug 14, 2019  9:50 AM) Dewaine Conger 2 tablets in the morning, 1 tablet at noon, and 2 tablets at nights  allopurinol (ZYLOPRIM) 100 MG tablet [id number] Yes TAKE 1 TABLET BY MOUTH  DAILY Lind Covert, MD Taking Active   atorvastatin (LIPITOR) 40 MG tablet [id number] Yes TAKE 1 TABLET BY MOUTH  DAILY Lind Covert, MD Taking Active   BD AUTOSHIELD DUO 30G X 5 MM MISC [id number]  USE ONCE DAILY TO INJECT  INSULIN AS DIRECTED Lind Covert, MD  Active   blood glucose meter kit and supplies [id number]  Dispense based on patient and insurance preference.Use to check blood sugar in the morning. (FOR ICD-10 E10.9, E11.9).  Roma Schanz is what she has been using. Martyn Malay, MD  Active   Blood Glucose Monitoring Suppl [facility] VERIO FLEX SYSTEM) w/Device Drucie Opitz [id number]  USE AS DIRECTED Lind Covert,  MD  Active   Blood Glucose Monitoring Suppl [facility] VERIO) w/Device Drucie Opitz [id number]  Use to check blood glucose as directed Martyn Malay, MD  Active   colchicine 0.6 MG tablet [id number] Yes TAKE 1 TABLET BY MOUTH  DAILY Chambliss, Jeb Levering, MD Taking Active   dorzolamide (TRUSOPT) 2 % ophthalmic solution [id number]  1 drop daily. [provider]  Active   dorzolamide-timolol (COSOPT) 22.3-6.8 MG/ML ophthalmic solution [id number]  Place 1 drop into both eyes 2 (two) times daily. [provider]  Active   EASY COMFORT PEN NEEDLES 31G X 8 MM MISC [id number]  USE TO INJECT INSULIN EVERY DAY AS DIRECTED Lind Covert, MD  Active   FLOVENT HFA 110 MCG/ACT inhaler [id number] Yes USE 2 INHALATIONS BY MOUTH  TWICE DAILY Lind Covert, MD Taking Active   gabapentin (NEURONTIN) 100 MG capsule [id number] Yes TAKE 3 CAPSULES BY MOUTH  TWICE DAILY AS NEEDED Lind Covert, MD Taking Active   glucose blood [facility] VERIO) test strip [id number]  Use as instructed Martyn Malay, MD  Active   glucose blood [facility] VERIO) test strip [id number]  CHECK BLOOD SUGAR IN THE  MORNING AS DIRECTED Martyn Malay, MD  Active   insulin glargine (LANTUS) 100 UNIT/ML Solostar Pen [id number] Yes Inject 32 units each morning. Lind Covert, MD Taking Active   JARDIANCE 25 MG TABS tablet [id number] Yes TAKE 1 TABLET BY MOUTH  DAILY  Lind Covert, MD Taking Active   Lancet Devices [facility] DELICA PLUS LANCING) MISC [id number]  USE ONCE DAILY Lind Covert, MD  Active   latanoprost (XALATAN) 0.005 % ophthalmic solution [id number]  Place 1 drop into both eyes at bedtime.  [provider]  Active   losartan (COZAAR) 100 MG tablet [id number] Yes TAKE 1 TABLET BY MOUTH AT  BEDTIME Chambliss, Jeb Levering, MD Taking Active   metoprolol succinate (TOPROL-XL) 25 MG 24 hr tablet [id number] Yes TAKE 1 TABLET BY MOUTH  DAILY Chambliss, Jeb Levering, MD Taking Active    Miconazole Nitrate (LOTRIMIN AF) 2 % AERO [id number]  Spray between toes once daily for 4 weeks. Gardiner Barefoot, DPM  Active   OneTouch Delica Lancets 15H MISC [id number]  1 Container by Does not apply route daily as needed. Lind Covert, MD  Active   Semaglutide, 2 MG/DOSE, (OZEMPIC, 2 MG/DOSE,) 8 MG/3ML SOPN [id number] Yes Inject 2 mg into the skin once a week. Lind Covert, MD Taking Active   spironolactone (ALDACTONE) 25 MG tablet [id number] Yes TAKE 1 TABLET BY MOUTH  DAILY Chambliss, Jeb Levering, MD Taking Active              SDOH:  (Social Determinants of Health) assessments and interventions performed:  SDOH Interventions    Flowsheet Row Most Recent Value  SDOH Interventions   Food Insecurity Interventions Intervention Not Indicated  Housing Interventions Intervention Not Indicated  Transportation Interventions Intervention Not Indicated       Care Plan  Review of patient past medical history, allergies, medications, health status, including review of consultants reports, laboratory and other test data, was performed as part of comprehensive evaluation for care management services.   Care Plan : General Plan of Care (Adult)  Updates made by Sulo Janczak, Eppie Gibson, RN since 02/01/2022 12:00 AM     Problem: Coping Skills (General Plan of Care) Resolved 02/01/2022  Priority: Medium  Onset Date: 11/25/2020  Note:   [id number] Resolving due to duplicate goal     Long-Range Goal: Coping Skills Enhanced Completed 02/01/2022  Start Date: 02/23/2021  Expected End Date: 11/27/2021  Recent Progress: On track  Priority: Medium  Note:   Evidence-based guidance:  Acknowledge, normalize and validate difficulty of making life-long lifestyle changes.  Identify current effective and ineffective coping strategies.  Encourage patient and caregiver participation in care to increase self-esteem, confidence and feelings of control.  Consider alternative and complementary  therapy approaches such as meditation, mindfulness or yoga.  Encourage participation in cognitive behavioral therapy to foster a positive identity, increase self-awareness, as well as bolster self-esteem, confidence and self-efficacy.  Discuss spirituality; be present as concerns are identified; encourage journaling, prayer, worship services, meditation or pastoral counseling.  Encourage participation in pleasurable group activities such as hobbies, singing, sports or volunteering).  Encourage the use of mindfulness; refer for training or intensive intervention.  Consider the use of meditative movement therapy such as tai chi, yoga or qigong.  Promote a regular daily exercise program based on tolerance, ability and patient choice to support positive thinking about disease or aging.   Notes:     Task: Support Psychosocial Response to Risk or Actual Health Condition Completed 02/01/2022  Due Date: 11/27/2021  Note:   Care Management Activities:    - active listening utilized - caregiver stress acknowledged - decision-making supported - healthy lifestyle promoted - verbalization of feelings encouraged    Notes:     Problem: Quality of Life (General Plan  of Care) Resolved 02/01/2022  Priority: Medium  Onset Date: 11/25/2021  Note:   [id number] Resolving due to duplicate goal     Long-Range Goal: Quality of Life Maintained Completed 02/01/2022  Start Date: 11/25/2020  Expected End Date: 11/27/2021  Recent Progress: On track  Priority: Medium  Note:   Evidence-based guidance:  Assess patient's thoughts about quality of life, goals and expectations, and dissatisfaction or desire to improve.  Identify issues of primary importance such as mental health, illness, exercise tolerance, pain, sexual function and intimacy, cognitive change, social isolation, finances and relationships.  Assess and monitor for signs/symptoms of psychosocial concerns, especially depression or ideations regarding harm  to others or self; provide or refer for mental health services as needed.  Identify sensory issues that impact quality of life such as hearing loss, vision deficit; strategize ways to maintain or improve hearing, vision.  Promote access to services in the community to support independence such as support groups, home visiting programs, financial assistance, handicapped parking tags, durable medical equipment and emergency responder.  Promote activities to decrease social isolation such as group support or social, leisure and recreational activities, employment, use of social media; consider safety concerns about being out of home for activities.  Provide patient an opportunity to share by storytelling or a "life review" to give positive meaning to life and to assist with coping and negative experiences.  Encourage patient to tap into hope to improve sense of self.  Counsel based on prognosis and as early as possible about end-of-life and palliative care; consider referral to palliative care provider.  Advocate for the development of palliative care plan that may include avoidance of unnecessary testing and intervention, symptom control, discontinuation of medications, hospice and organ donation.  Counsel as early as possible those with life-limiting chronic disease about palliative care; consider referral to palliative care provider.  Advocate for the development of palliative care plan.   Notes:     Task: Support and Maintain Acceptable Degree of Health, Comfort and Happiness Completed 02/01/2022  Due Date: 11/27/2021  Note:   Care Management Activities:    - expression of thoughts about present/future encouraged - independence in all possible areas promoted - patient strengths promoted - self-expression encouraged - wellness behaviors promoted    Notes:     Care Plan : Diabetes Type 2 (Adult)  Updates made by Verlin Grills, RN since 02/01/2022 12:00 AM     Problem: Glycemic  Management (Diabetes, Type 2) Resolved 02/01/2022  Priority: Medium  Onset Date: 11/25/2020  Note:   [id number] Resolving due to duplicate goal     Long-Range Goal: Glycemic Management Optimized Completed 02/01/2022  Start Date: 11/25/2020  Expected End Date: 02/25/2022  Recent Progress: Not on track  Priority: Medium  Note:   Evidence-based guidance:  Anticipate A1C testing (point-of-care) every 3 to 6 months based on goal attainment.  Review mutually-set A1C goal or target range.  Anticipate use of antihyperglycemic with or without insulin and periodic adjustments; consider active involvement of pharmacist.  Provide medical nutrition therapy and development of individualized eating.  Compare self-reported symptoms of hypo or hyperglycemia to blood glucose levels, diet and fluid intake, current medications, psychosocial and physiologic stressors, change in activity and barriers to care adherence.  Promote self-monitoring of blood glucose levels.  Assess and address barriers to management plan, such as food insecurity, age, developmental ability, depression, anxiety, fear of hypoglycemia or weight gain, as well as medication cost, side effects and complicated regimen.  Consider referral  to community-based diabetes education program, visiting nurse, community health worker or Engineer, maintenance.  Encourage regular dental care for treatment of periodontal disease; refer to dental provider when needed.   Notes:  [id number] Patient fasting blood sugar is elevated running in the 200's. Today fasting blood sugar is 253    Task: Alleviate Barriers to Glycemic Management Completed 02/01/2022  Note:   Care Management Activities:    - blood glucose monitoring encouraged - blood glucose readings reviewed - mutual A1C goal set or reviewed - resources required to improve adherence to care identified - use of blood glucose monitoring log promoted    Notes:     Problem: Disease Progression (Diabetes, Type  2) Resolved 02/01/2022  Priority: Medium  Onset Date: 02/23/2021  Note:   [id number] Resolving due to duplicate goal     Long-Range Goal: Disease Progression Prevented or Minimized Completed 02/01/2022  Start Date: 02/23/2021  Expected End Date: 02/25/2022  Recent Progress: On track  Priority: Medium  Note:   Evidence-based guidance:  Prepare patient for laboratory and diagnostic exams based on risk and presentation.  Encourage lifestyle changes, such as increased intake of plant-based foods, stress reduction, consistent physical activity and smoking cessation to prevent long-term complications and chronic disease.   Individualize activity and exercise recommendations while considering potential limitations, such as neuropathy, retinopathy or the ability to prevent hyperglycemia or hypoglycemia.   Prepare patient for use of pharmacologic therapy that may include antihypertensive, analgesic, prostaglandin E1 with periodic adjustments, based on presenting chronic condition and laboratory results.  Assess signs/symptoms and risk factors for hypertension, sleep-disordered breathing, neuropathy (including changes in gait and balance), retinopathy, nephropathy and sexual dysfunction.  Address pregnancy planning and contraceptive choice, especially when prescribing antihypertensive or statin.  Ensure completion of annual comprehensive foot exam and dilated eye exam.   Implement additional individualized goals and interventions based on identified risk factors.  Prepare patient for consultation or referral for specialist care, such as ophthalmology, neurology, cardiology, podiatry, nephrology or perinatology.   Notes:  [id number] Patient A1C has decreased from 9.2 to 8.9    Task: Monitor and Manage Follow-up for Comorbidities Completed 02/01/2022  Due Date: 02/25/2022  Note:   Care Management Activities:    - completion of annual foot exam verified - healthy lifestyle promoted - reduction of sedentary  activity encouraged - signs/symptoms of comorbidities identified    Notes:     Care Plan : [place] of Care  Updates made by Brach Birdsall, Eppie Gibson, RN since 02/01/2022 12:00 AM     Problem: Knowledge Deficit Related to Diabetes and Care Coordination Needs   Priority: High     Long-Range Goal: Development Plan of Care for Management of Diabetes   Start Date: 02/01/2022  Expected End Date: 11/27/2022  Priority: High  Note:   Current Barriers:  Knowledge Deficits related to plan of care for management of DMII   RNCM Clinical Goal(s):  Patient will verbalize understanding of plan for management of DMII as evidenced by continuation of monitoring blood sugars and adhering to diabetic diet  through collaboration with RN Care manager, provider, and care team.   Interventions: Inter-disciplinary care team collaboration (see longitudinal plan of care) Evaluation of current treatment plan related to  self management and patient's adherence to plan as established by provider   Diabetes Interventions:  (Status:  Goal on track:  Yes.) Long Term Goal Assessed patient's understanding of A1c goal: <7% Provided education to patient about basic DM disease process Counseled  on importance of regular laboratory monitoring as prescribed Advised patient, providing education and rationale, to check cbg daily and record, calling Dr for findings outside established parameters Lab Results  Component Value Date   HGBA1C 7.9 (A) 02/01/2022   Patient Goals/Self-Care Activities: Take all medications as prescribed Attend all scheduled provider appointments Call pharmacy for medication refills 3-7 days in advance of running out of medications Perform all self care activities independently  Perform IADL's (shopping, preparing meals, housekeeping, managing finances) independently Call provider office for new concerns or questions  call the Suicide and Crisis Lifeline: 988 if experiencing a Mental  Health or [place]  keep appointment with eye doctor schedule appointment with eye doctor check blood sugar at prescribed times: once daily check feet daily for cuts, sores or redness take the blood sugar log to all doctor visits trim toenails straight across drink 6 to 8 glasses of water each day manage portion size wash and dry feet carefully every day wear comfortable, cotton socks wear comfortable, well-fitting shoes  Follow Up Plan:  Telephone follow up appointment with care management team member scheduled for:  June 2023 The patient has been provided with contact information for the care management team and has been advised to call with any health related questions or concerns.    [id number]  Patient A1C 7.6 decreased from 8.6. Per patient she monitoring her diet. Patient is monitoring her blood sugar daily.  BP is controlled.       Plan: Telephone follow up appointment with care management team member scheduled for:  May 04, 2022 The patient has been provided with contact information for the care management team and has been advised to call with any health related questions or concerns.   [place] [facility] [phone]

## 2022-02-01 NOTE — Progress Notes (Signed)
? ? ?  SUBJECTIVE:  ? ?CHIEF COMPLAINT / HPI:  ? ?Diabetes ?On Lantus 32 units once daily , Ozempic to 2 mg once weekly on Fridays, SGLT2-I jardiance '25mg'$ , No low blood sugar  ? ? ?Hypertension ?Her blood pressure by her log shows in 130-40s/70s.  No lightheadness or edema or falls  ?Brings in all her medications  ? ? ?OBJECTIVE:  ? ?BP 138/76   Pulse 77   Ht '5\' 2"'$  (1.575 m)   Wt 216 lb (98 kg)   BMI 39.51 kg/m?   ?Heart - Regular rate and rhythm.  No murmurs, gallops or rubs.    ?Lungs:  Normal respiratory effort, chest expands symmetrically. Lungs are clear to auscultation, no crackles or wheezes. ? ? ?ASSESSMENT/PLAN:  ? ?Essential hypertension ?BP Readings from Last 3 Encounters:  ?02/01/22 138/76  ?12/31/21 139/76  ?12/30/21 (!) 148/82  ? ?At goal today.  Continue current medications  ? ?Type 2 diabetes mellitus with diabetic foot deformity (HCC) ?Improving with current regimen.  Would continue  ?  ? ? ?Lind Covert, MD ?[place]  ?

## 2022-02-03 ENCOUNTER — Ambulatory Visit: Payer: Medicare Other

## 2022-02-03 ENCOUNTER — Other Ambulatory Visit: Payer: Self-pay

## 2022-02-03 DIAGNOSIS — M2010 Hallux valgus (acquired), unspecified foot: Secondary | ICD-10-CM

## 2022-02-03 DIAGNOSIS — E11610 Type 2 diabetes mellitus with diabetic neuropathic arthropathy: Secondary | ICD-10-CM

## 2022-02-03 DIAGNOSIS — E1140 Type 2 diabetes mellitus with diabetic neuropathy, unspecified: Secondary | ICD-10-CM

## 2022-02-03 NOTE — Progress Notes (Signed)
SITUATION ?Reason for Consult: Evaluation for Prefabricated Diabetic Shoes and Custom Diabetic Inserts. ?Patient / Caregiver Report: Patient would like well fitting shoes ? ?OBJECTIVE DATA: ?Patient History / Diagnosis:  ?  ICD-10-CM   ?1. Type 2 diabetes mellitus with diabetic neuropathy, without long-term current use of insulin (HCC)  E11.40   ?  ?2. Acquired hallux valgus, unspecified laterality  M20.10   ?  ?3. Charcot foot due to diabetes mellitus ([place])  E11.610   ?  ? ? ?Current or Previous Devices:   Historical user ? ?In-Person Foot Examination: ?Ulcers & Callousing:   Historical ? ?Deformities:   ?- Hallux valgus  ?- Charcot?s joint ankle and foot ?  ?Shoe Size: 9XW ? ?ORTHOTIC RECOMMENDATION ?Recommended Devices: ?- 1x pair prefabricated PDAC approved diabetic shoes; Patient Selected - Orthofeet [employer] 844 grey  Size 9XW ?- 3x pair custom-to-patient PDAC approved vacuum formed diabetic insoles. ? ?GOALS OF SHOES AND INSOLES ?- Reduce shear and pressure ?- Reduce / Prevent callus formation ?- Reduce / Prevent ulceration ?- Protect the fragile healing compromised diabetic foot. ? ?Patient would benefit from diabetic shoes and inserts as patient has diabetes mellitus and the patient has one or more of the following conditions: ?- Peripheral neuropathy with evidence of callus formation ?- Foot deformity ?- Poor circulation ? ?ACTIONS PERFORMED ?Patient was casted for insoles via crush box and measured for shoes via brannock device. Procedure was explained and patient tolerated procedure well. All questions were answered and concerns addressed. ? ?PLAN ?Patient is to be contacted and scheduled for fitting once CMN is obtained from [place] physician and shoes and insoles have been fabricated and received. ? ?

## 2022-02-04 ENCOUNTER — Telehealth: Payer: Self-pay

## 2022-02-04 NOTE — Telephone Encounter (Signed)
Casts Sent to [employer] ?

## 2022-02-06 ENCOUNTER — Other Ambulatory Visit: Payer: Self-pay | Admitting: Family Medicine

## 2022-03-09 ENCOUNTER — Ambulatory Visit
Admission: RE | Admit: 2022-03-09 | Discharge: 2022-03-09 | Disposition: A | Discharge status: Home / Self Care | Payer: Medicaid Other | Source: Ambulatory Visit | Attending: Hematology | Admitting: Hematology

## 2022-03-09 DIAGNOSIS — Z1231 Encounter for screening mammogram for malignant neoplasm of breast: Secondary | ICD-10-CM | POA: Diagnosis not present

## 2022-03-09 DIAGNOSIS — Z170 Estrogen receptor positive status [ER+]: Secondary | ICD-10-CM

## 2022-03-18 ENCOUNTER — Other Ambulatory Visit: Payer: Self-pay | Admitting: Family Medicine

## 2022-03-18 DIAGNOSIS — I10 Essential (primary) hypertension: Secondary | ICD-10-CM

## 2022-03-28 ENCOUNTER — Other Ambulatory Visit: Payer: Self-pay | Admitting: Family Medicine

## 2022-04-01 NOTE — Addendum Note (Signed)
Addended by: Gardiner Barefoot on: 04/01/2022 11:41 AM ? ? Modules accepted: Orders ? ?

## 2022-04-12 ENCOUNTER — Telehealth: Payer: Self-pay

## 2022-04-12 NOTE — Telephone Encounter (Signed)
Casts released from fabrication hold ? ?Shoes ordered - Orthofeet [employer] 844 grey 9XW ?

## 2022-04-24 ENCOUNTER — Other Ambulatory Visit: Payer: Self-pay | Admitting: Family Medicine

## 2022-04-30 ENCOUNTER — Encounter: Payer: Self-pay | Admitting: Podiatry

## 2022-04-30 ENCOUNTER — Ambulatory Visit (INDEPENDENT_AMBULATORY_CARE_PROVIDER_SITE_OTHER): Payer: Medicare Other | Admitting: Podiatry

## 2022-04-30 DIAGNOSIS — E1140 Type 2 diabetes mellitus with diabetic neuropathy, unspecified: Secondary | ICD-10-CM

## 2022-04-30 DIAGNOSIS — M2010 Hallux valgus (acquired), unspecified foot: Secondary | ICD-10-CM

## 2022-04-30 DIAGNOSIS — E11610 Type 2 diabetes mellitus with diabetic neuropathic arthropathy: Secondary | ICD-10-CM

## 2022-04-30 DIAGNOSIS — B351 Tinea unguium: Secondary | ICD-10-CM | POA: Diagnosis not present

## 2022-04-30 NOTE — Progress Notes (Signed)
This patient returns to my office for at risk foot care.  This patient requires this care by a professional since this patient will be at risk due to having diabetes.   This patient is unable to cut nails herself since the patient cannot reach her nails.These nails are painful walking and wearing shoes.  This patient presents for at risk foot care today.  General Appearance  Alert, conversant and in no acute stress.  Vascular  Dorsalis pedis and posterior tibial  pulses are palpable  bilaterally.  Capillary return is within normal limits  bilaterally. Temperature is within normal limits  bilaterally.  Neurologic  Senn-Weinstein monofilament wire test absent   bilaterally. Muscle power within normal limits bilaterally.  Nails Thick disfigured discolored nails with subungual debris  from hallux to fifth toes bilaterally. No evidence of bacterial infection or drainage bilaterally.  Orthopedic  No limitations of motion  feet .  No crepitus or effusions noted.  No bony pathology or digital deformities noted. Asymptomatic  HAV  B/L.  Pes planus  B/L. Hammer toes second left foot.  Skin  normotropic skin with no porokeratosis noted bilaterally.  No signs of infections or ulcers noted.     Onychomycosis  Pain in right toes  Pain in left toes    Consent was obtained for treatment procedures.   Mechanical debridement of nails 1-5  bilaterally performed with a nail nipper.  Filed with dremel without incident. Asks to see Rick.   Return office visit  3 months                    Told patient to return for periodic foot care and evaluation due to potential at risk complications   Charnelle Bergeman DPM  

## 2022-05-04 ENCOUNTER — Encounter: Payer: Self-pay | Admitting: *Deleted

## 2022-05-04 ENCOUNTER — Other Ambulatory Visit: Payer: Self-pay | Admitting: Family Medicine

## 2022-05-04 ENCOUNTER — Other Ambulatory Visit: Payer: Self-pay | Admitting: *Deleted

## 2022-05-05 ENCOUNTER — Ambulatory Visit (INDEPENDENT_AMBULATORY_CARE_PROVIDER_SITE_OTHER): Payer: Medicare Other | Admitting: Family Medicine

## 2022-05-05 ENCOUNTER — Other Ambulatory Visit: Payer: Self-pay | Admitting: *Deleted

## 2022-05-05 ENCOUNTER — Encounter: Payer: Self-pay | Admitting: Family Medicine

## 2022-05-05 VITALS — BP 142/70 | HR 81 | Temp 98.1°F | Ht 62.0 in | Wt 218.0 lb

## 2022-05-05 DIAGNOSIS — M1000 Idiopathic gout, unspecified site: Secondary | ICD-10-CM | POA: Diagnosis not present

## 2022-05-05 DIAGNOSIS — M21969 Unspecified acquired deformity of unspecified lower leg: Secondary | ICD-10-CM | POA: Diagnosis not present

## 2022-05-05 DIAGNOSIS — E1340 Other specified diabetes mellitus with diabetic neuropathy, unspecified: Secondary | ICD-10-CM

## 2022-05-05 DIAGNOSIS — I10 Essential (primary) hypertension: Secondary | ICD-10-CM

## 2022-05-05 DIAGNOSIS — E1169 Type 2 diabetes mellitus with other specified complication: Secondary | ICD-10-CM | POA: Diagnosis not present

## 2022-05-05 DIAGNOSIS — I5042 Chronic combined systolic (congestive) and diastolic (congestive) heart failure: Secondary | ICD-10-CM | POA: Diagnosis not present

## 2022-05-05 LAB — POCT GLYCOSYLATED HEMOGLOBIN (HGB A1C): HbA1c, POC (controlled diabetic range): 7.3 % — AB (ref 0.0–7.0)

## 2022-05-05 NOTE — Assessment & Plan Note (Signed)
Near goal on multiple medications.  Continue current ones

## 2022-05-05 NOTE — Assessment & Plan Note (Signed)
Given day time sleepiness with titrate down gabapentin as tolerated.  See after visit summary

## 2022-05-05 NOTE — Assessment & Plan Note (Signed)
Well controlled without hypoglycemia.

## 2022-05-05 NOTE — Assessment & Plan Note (Signed)
Well controlled.  Could consider stopping colchicine at future visit

## 2022-05-05 NOTE — Assessment & Plan Note (Signed)
Well controlled on standard medications

## 2022-05-05 NOTE — Progress Notes (Addendum)
    SUBJECTIVE:   CHIEF COMPLAINT / HPI:   Hypertension No lightheadness or falls  Does feel tired a lot.  No chest pain or changing edema  Diabetes Knows and brngs all her medications.  No low blood sugar.  Taking 300 mg gabapentin bid  Gait No falls.  Hopefully having ramp built at her house  Gout No recent attacks.  Takes allopurinol and colchcine daily  PERTINENT  PMH / PSH: goes to church regularly  OBJECTIVE:   BP (!) 142/70   Pulse 81   Temp 98.1 F (36.7 C)   Ht '5\' 2"'$  (1.575 m)   Wt 218 lb (98.9 kg)   SpO2 95%   BMI 39.87 kg/m   Heart - Regular rate and rhythm.  No murmurs, gallops or rubs.    Lungs:  Normal respiratory effort, chest expands symmetrically. Lungs are clear to auscultation, no crackles or wheezes. Extremities:  No cyanosis, edema, or deformity noted with good range of motion of all major joints.   Walks with walker  Mild intermittent stutter   ASSESSMENT/PLAN:   Essential hypertension Near goal on multiple medications.  Continue current ones   Chronic combined systolic and diastolic heart failure (HCC) Well controlled on standard medications   Type 2 diabetes mellitus with diabetic foot deformity ([place]) Well controlled without hypoglycemia.    Neuropathy due to secondary diabetes ([place]) Given day time sleepiness with titrate down gabapentin as tolerated.  See after visit summary   Gout Well controlled.  Could consider stopping colchicine at future visit    Patient Instructions  Good to see you today - Thank you for coming in  Things we discussed today:  Gabapentin  Take 2 tabs in the morning and 3 at night.  If doing ok then go down to one tab in the morning and 3 at night.  If still ok stop the morning and just take 3 at night  You need an diabetes eye exam every year.  Please see your eye doctor.  Ask them to fax [place] a report of your exam    Please always bring your medication bottles  Come back to see me in 3-6 months     Lind Covert, [place]

## 2022-05-05 NOTE — Patient Instructions (Addendum)
Good to see you today - Thank you for coming in  Things we discussed today:  Gabapentin  Take 2 tabs in the morning and 3 at night.  If doing ok then go down to one tab in the morning and 3 at night.  If still ok stop the morning and just take 3 at night  You need an diabetes eye exam every year.  Please see your eye doctor.  Ask them to fax [place] a report of your exam    Please always bring your medication bottles  Come back to see me in 3-6 months

## 2022-05-05 NOTE — Patient Outreach (Signed)
[facility]) Care Management  11/29/4641  [name] [birth year] [id number]  RN Health Coach telephone call to patient.  [employer] compliance verified. Patient is eating breakfast. Rn will call back later today.  [place] Care Management [phone]

## 2022-05-06 NOTE — Patient Instructions (Signed)
Visit Information  Thank you for taking time to visit with me today. Please don't hesitate to contact me if I can be of assistance to you before our next scheduled telephone appointment.  Following are the goals we discussed today:  Current Barriers:  Knowledge Deficits related to plan of care for management of DMII   RNCM Clinical Goal(s):  Patient will verbalize understanding of plan for management of DMII as evidenced by continuation of monitoring blood sugars and adhering to diabetic diet through collaboration with RN Care manager, provider, and care team.   Interventions: Inter-disciplinary care team collaboration (see longitudinal plan of care) Evaluation of current treatment plan related to  self management and patient's adherence to plan as established by provider   Diabetes Interventions:  (Status:  Goal on track:  Yes.) Long Term Goal Assessed patient's understanding of A1c goal: <7% Provided education to patient about basic DM disease process Counseled on importance of regular laboratory monitoring as prescribed Advised patient, providing education and rationale, to check cbg daily and record, calling Dr for findings outside established parameters Lab Results  Component Value Date   HGBA1C 7.9 (A) 02/01/2022    Patient Goals/Self-Care Activities: Take all medications as prescribed Attend all scheduled provider appointments Call pharmacy for medication refills 3-7 days in advance of running out of medications Perform all self care activities independently  Perform IADL's (shopping, preparing meals, housekeeping, managing finances) independently Call provider office for new concerns or questions  call the Suicide and Crisis Lifeline: 988 if experiencing a Mental Health or [place]  keep appointment with eye doctor schedule appointment with eye doctor check blood sugar at prescribed times: once daily check feet daily for cuts, sores or redness take the  blood sugar log to all doctor visits trim toenails straight across drink 6 to 8 glasses of water each day manage portion size wash and dry feet carefully every day wear comfortable, cotton socks wear comfortable, well-fitting shoes  Follow Up Plan:  Telephone follow up appointment with care management team member scheduled for:  June 2023 The patient has been provided with contact information for the care management team and has been advised to call with any health related questions or concerns.    [id number]  Patient A1C 7.6 decreased from 8.6. Per patient she is monitoring her diet. Patient is monitoring her blood sugar daily.  BP is controlled.   [id number] Fasting blood sugar is 166. AC 7.9 increased from 7.6. Per patient she is exercising. She is using a pedal bike and walking. She is keeping appointments. She is taking medications as per ordered.   Our next appointment is by telephone on August 04, 2022   Please call Dolly Rias [phone]  if you need to cancel or reschedule your appointment.   Please call the Suicide and Crisis Lifeline: 988 if you are experiencing a Mental Health or [place] or need someone to talk to.  The patient verbalized understanding of instructions, educational materials, and care plan provided today and agreed to receive a mailed copy of patient instructions, educational materials, and care plan.   Telephone follow up appointment with care management team member scheduled for: The patient has been provided with contact information for the care management team and has been advised to call with any health related questions or concerns.   [place] Care Management [phone]

## 2022-05-07 NOTE — Patient Outreach (Signed)
[facility]) Care Management [place] Note   07/06/9168 Name:  [name] MRN:  [id number] DOB:  1950/12/20  Summary: Fasting blood sugar is 166. AC 7.9 increased from 7.6. Per patient she is exercising. She is using a pedal bike and walking. She is keeping appointments. She is taking medications as per ordered.     Recommendations/Changes made from today's visit: Medication adherence Monitor and document blood sugars Set exercises up to a scheduled routine Adhere to diet   Subjective: [name] is an 71 y.o. year old female who is a primary patient of Chambliss, Jeb Levering, MD. The care management team was consulted for assistance with care management and/or care coordination needs.    RN Health Coach completed Telephone Visit today.   Objective:  Medications Reviewed Today     Reviewed by Lind Covert, MD (Physician) on 05/05/22 at [place] List Status: <None>   Medication Order Taking? Sig Documenting Provider Last Dose Status Informant  acetaminophen (TYLENOL) 650 MG CR tablet [id number] Yes Take 650 mg by mouth every 8 (eight) hours as needed for pain. [provider] Taking Active Self           Med Note Valentina Lucks, Monico Blitz   Tue Aug 14, 2019  9:50 AM) Dewaine Conger 2 tablets in the morning, 1 tablet at noon, and 2 tablets at nights  allopurinol (ZYLOPRIM) 100 MG tablet [id number] Yes TAKE 1 TABLET BY MOUTH  DAILY Lind Covert, MD Taking Active   atorvastatin (LIPITOR) 40 MG tablet [id number] Yes TAKE 1 TABLET BY MOUTH  DAILY Lind Covert, MD Taking Active   BD AUTOSHIELD DUO 30G X 5 MM MISC [id number]  USE ONCE DAILY TO INJECT  INSULIN AS DIRECTED Lind Covert, MD  Active   blood glucose meter kit and supplies [id number]  Dispense based on patient and insurance preference.Use to check blood sugar in the morning. (FOR ICD-10 E10.9, E11.9).  Roma Schanz is what she has been using. Martyn Malay, MD  Active    Blood Glucose Monitoring Suppl [facility] VERIO FLEX SYSTEM) w/Device Drucie Opitz [id number]  USE AS DIRECTED Lind Covert, MD  Active   Blood Glucose Monitoring Suppl [facility] VERIO) w/Device Drucie Opitz [id number]  Use to check blood glucose as directed Martyn Malay, MD  Active   CARESTART COVID-19 HOME TEST KIT [id number]  See admin instructions. [provider]  Active   colchicine 0.6 MG tablet [id number] Yes TAKE 1 TABLET BY MOUTH  DAILY Chambliss, Jeb Levering, MD Taking Active   dorzolamide (TRUSOPT) 2 % ophthalmic solution [id number]  1 drop daily. [provider]  Active   dorzolamide-timolol (COSOPT) 22.3-6.8 MG/ML ophthalmic solution [id number]  Place 1 drop into both eyes 2 (two) times daily. [provider]  Active   EASY COMFORT PEN NEEDLES 31G X 8 MM MISC [id number]  USE TO INJECT INSULIN EVERY DAY AS DIRECTED Lind Covert, MD  Active   FLOVENT HFA 110 MCG/ACT inhaler [id number] Yes USE 2 INHALATIONS BY MOUTH  TWICE DAILY Lind Covert, MD Taking Active   gabapentin (NEURONTIN) 100 MG capsule [id number] Yes TAKE 3 CAPSULES BY MOUTH  TWICE DAILY AS NEEDED Lind Covert, MD Taking Active   glucose blood [facility] VERIO) test strip [id number]  Use as instructed Martyn Malay, MD  Active   glucose blood [facility] VERIO) test strip [id number]  CHECK BLOOD SUGAR IN THE  MORNING AS DIRECTED Martyn Malay, MD  Active   insulin glargine (LANTUS) 100 UNIT/ML Solostar Pen [id number] Yes Inject 32 units each morning. Lind Covert, MD Taking Active   JARDIANCE 25 MG TABS tablet [id number] Yes TAKE 1 TABLET BY MOUTH  DAILY Martyn Malay, MD Taking Active   Lancet Devices [facility] PLUS LANCING) MISC [id number]  USE ONCE DAILY Lind Covert, MD  Active   latanoprost (XALATAN) 0.005 % ophthalmic solution [id number]  Place 1 drop into both eyes at bedtime.  [provider]  Active   losartan (COZAAR) 100 MG tablet [id number] Yes  TAKE 1 TABLET BY MOUTH AT  BEDTIME Chambliss, Jeb Levering, MD Taking Active   metoprolol succinate (TOPROL-XL) 25 MG 24 hr tablet [id number] Yes TAKE 1 TABLET BY MOUTH  DAILY Chambliss, Jeb Levering, MD Taking Active   Miconazole Nitrate (LOTRIMIN AF) 2 % AERO [id number]  Spray between toes once daily for 4 weeks. Gardiner Barefoot, DPM  Active   OneTouch Delica Lancets 88L MISC [id number]  1 Container by Does not apply route daily as needed. Lind Covert, MD  Active   [facility] VERIO test strip [id number]  CHECK BLOOD SUGAR IN THE  MORNING AS DIRECTED Lind Covert, MD  Active   OZEMPIC, 2 MG/DOSE, 8 MG/3ML Bonney Aid [id number] Yes INJECT SUBCUTANEOUSLY 2 MG ONCE  A WEEK Chambliss, Jeb Levering, MD Taking Active   spironolactone (ALDACTONE) 25 MG tablet [id number] Yes TAKE 1 TABLET BY MOUTH  DAILY Chambliss, Jeb Levering, MD Taking Active              SDOH:  (Social Determinants of Health) assessments and interventions performed:    Care Plan  Review of patient past medical history, allergies, medications, health status, including review of consultants reports, laboratory and other test data, was performed as part of comprehensive evaluation for care management services.     Plan: Telephone follow up appointment with care management team member scheduled for:  08/04/2022 The patient has been provided with contact information for the care management team and has been advised to call with any health related questions or concerns.   [place] Care Management [phone]

## 2022-05-16 ENCOUNTER — Other Ambulatory Visit: Payer: Self-pay | Admitting: Family Medicine

## 2022-05-17 ENCOUNTER — Ambulatory Visit (INDEPENDENT_AMBULATORY_CARE_PROVIDER_SITE_OTHER): Payer: Medicare Other

## 2022-05-17 DIAGNOSIS — M2012 Hallux valgus (acquired), left foot: Secondary | ICD-10-CM

## 2022-05-17 DIAGNOSIS — M2011 Hallux valgus (acquired), right foot: Secondary | ICD-10-CM | POA: Diagnosis not present

## 2022-05-17 DIAGNOSIS — E1140 Type 2 diabetes mellitus with diabetic neuropathy, unspecified: Secondary | ICD-10-CM

## 2022-05-17 DIAGNOSIS — M2010 Hallux valgus (acquired), unspecified foot: Secondary | ICD-10-CM

## 2022-05-17 NOTE — Progress Notes (Signed)
SITUATION Reason for Visit: Fitting of Diabetic Shoes & Insoles Patient / Caregiver Report:  Patient is satisfied with fit and function of shoes and insoles.  OBJECTIVE DATA: Patient History / Diagnosis:     ICD-10-CM   1. Type 2 diabetes mellitus with diabetic neuropathy, without long-term current use of insulin (HCC)  E11.40     2. Acquired hallux valgus, unspecified laterality  M20.10       Change in Status:   None  ACTIONS PERFORMED: In-Person Delivery, patient was fit with: - 1x pair [id number] PDAC approved prefabricated Diabetic Shoes: Orthofeet francis 844 [id number] - 3x pair [id number] PDAC approved vacuum formed custom diabetic insoles; [employer]: [id number]  Shoes and insoles were verified for structural integrity and safety. Patient wore shoes and insoles in office. Skin was inspected and free of areas of concern after wearing shoes and inserts. Shoes and inserts fit properly. Patient / Caregiver provided with ferbal instruction and demonstration regarding donning, doffing, wear, care, proper fit, function, purpose, cleaning, and use of shoes and insoles ' and in all related precautions and risks and benefits regarding shoes and insoles. Patient / Caregiver was instructed to wear properly fitting socks with shoes at all times. Patient was also provided with verbal instruction regarding how to report any failures or malfunctions of shoes or inserts, and necessary follow up care. Patient / Caregiver was also instructed to contact physician regarding change in status that may affect function of shoes and inserts.   Patient / Caregiver verbalized undersatnding of instruction provided. Patient / Caregiver demonstrated independence with proper donning and doffing of shoes and inserts.  PLAN Patient to follow with treating physician as recommended. Plan of care was discussed with and agreed upon by patient and/or caregiver. All questions were answered and concerns addressed.

## 2022-05-21 ENCOUNTER — Other Ambulatory Visit: Payer: Self-pay

## 2022-05-21 MED ORDER — ONETOUCH DELICA PLUS LANCING MISC: 3 refills | Status: AC

## 2022-06-10 ENCOUNTER — Ambulatory Visit
Admission: RE | Admit: 2022-06-10 | Discharge: 2022-06-10 | Disposition: A | Discharge status: Home / Self Care | Payer: Medicaid Other | Source: Ambulatory Visit | Attending: Hematology | Admitting: Hematology

## 2022-06-10 DIAGNOSIS — M85832 Other specified disorders of bone density and structure, left forearm: Secondary | ICD-10-CM | POA: Diagnosis not present

## 2022-06-10 DIAGNOSIS — Z780 Asymptomatic menopausal state: Secondary | ICD-10-CM | POA: Diagnosis not present

## 2022-06-10 DIAGNOSIS — C50511 Malignant neoplasm of lower-outer quadrant of right female breast: Secondary | ICD-10-CM

## 2022-06-15 ENCOUNTER — Other Ambulatory Visit: Payer: Self-pay | Admitting: *Deleted

## 2022-06-15 ENCOUNTER — Encounter: Payer: Self-pay | Admitting: *Deleted

## 2022-06-15 NOTE — Patient Outreach (Signed)
[facility]) Care Management  4/40/1027  [name] [birth year] [id number]   [id number] Fasting blood sugar is 188 today. A1C is 7.9 increased from 7.6. RN Health Coach will close case. Patient will continue to  be followed by Care Coordinator.  [facility] [phone]

## 2022-06-21 ENCOUNTER — Telehealth: Payer: Self-pay

## 2022-06-21 NOTE — Telephone Encounter (Signed)
This nurse spoke with patient and made aware of Dexa results and recommendations from provider.  Patient states that she currently taking Calcium and Vit D and will continue.  Patient acknowledged understanding and has no further questions or concerns.

## 2022-06-21 NOTE — Telephone Encounter (Signed)
-----   Message from Truitt Merle, MD sent at 06/20/2022 10:01 AM EDT ----- Please let pt know her DEXA showed stable osteopenia, continue calcium and vitd supplement, thanks   Truitt Merle

## 2022-06-21 NOTE — Telephone Encounter (Signed)
This nurse attempted to reach patient related to san results and recommendations from the provider.  Left a message for patient to return call to the facility.  No other concerns at this time.

## 2022-07-01 ENCOUNTER — Other Ambulatory Visit: Payer: Self-pay | Admitting: Family Medicine

## 2022-07-01 DIAGNOSIS — E119 Type 2 diabetes mellitus without complications: Secondary | ICD-10-CM | POA: Diagnosis not present

## 2022-07-01 DIAGNOSIS — H401113 Primary open-angle glaucoma, right eye, severe stage: Secondary | ICD-10-CM | POA: Diagnosis not present

## 2022-07-01 DIAGNOSIS — Z961 Presence of intraocular lens: Secondary | ICD-10-CM | POA: Diagnosis not present

## 2022-07-01 DIAGNOSIS — H401121 Primary open-angle glaucoma, left eye, mild stage: Secondary | ICD-10-CM | POA: Diagnosis not present

## 2022-07-14 ENCOUNTER — Other Ambulatory Visit: Payer: Self-pay | Admitting: Family Medicine

## 2022-07-14 DIAGNOSIS — E785 Hyperlipidemia, unspecified: Secondary | ICD-10-CM

## 2022-07-16 LAB — HM DIABETES EYE EXAM

## 2022-08-03 ENCOUNTER — Ambulatory Visit: Payer: Self-pay

## 2022-08-04 ENCOUNTER — Ambulatory Visit: Payer: Medicare Other | Admitting: *Deleted

## 2022-08-04 NOTE — Patient Outreach (Signed)
  Care Coordination   Initial Visit Note 7/0/3500  Name: [name] MRN: [id number] DOB: [birth year]  [name] is a 71 y.o. year old female who sees Chambliss, Jeb Levering, MD for primary care. I spoke with  [name] by phone today.  What matters to the patients health and wellness today?  I want to manage my blood sugar.    Goals Addressed             This Visit's Progress    I want to monitor and mange my blood sugars        Care Coordination Interventions:  Reviewed medications with patient and discussed importance of medication adherence Discussed plans with patient for ongoing care management follow up and provided patient with direct contact information for care management team Reviewed scheduled/upcoming provider appointments including: 08/10/22 Dr Evaristo Bury check Advised patient, providing education and rationale, to check cbg  and record, calling the office for findings outside established parameters Review of patient status, including review of consultants reports, relevant laboratory and other test results, and medications completed Active listening / Reflection utilized  Emotional Support Provided Problem [place] strategies reviewed [name] checks her blood sugar daily, with levels ranging between 130-159. She exercises with a foot pedal bike, takes her medications, and monitors her diet. I advised her to maintain her regimen.         SDOH assessments and interventions completed:  No     Care Coordination Interventions Activated:  Yes  Care Coordination Interventions:  Yes, provided   Follow up plan: Follow up call scheduled for 10/5 1 pm    Encounter Outcome:  Pt. Visit Completed   Lazaro Arms RN, BSN, [facility]   Phone: [phone]

## 2022-08-04 NOTE — Patient Instructions (Signed)
Visit Information  Thank you for taking time to visit with me today. Please don't hesitate to contact me if I can be of assistance to you.   Following are the goals we discussed today:   Goals Addressed             This Visit's Progress    I want to monitor and mange my blood sugars        Care Coordination Interventions:  Reviewed medications with patient and discussed importance of medication adherence Discussed plans with patient for ongoing care management follow up and provided patient with direct contact information for care management team Reviewed scheduled/upcoming provider appointments including: 08/10/22 Dr Evaristo Bury check Advised patient, providing education and rationale, to check cbg  and record, calling the office for findings outside established parameters Review of patient status, including review of consultants reports, relevant laboratory and other test results, and medications completed Active listening / Reflection utilized  Emotional Support Provided Problem [place] strategies reviewed Mrs. Sabino checks her blood sugar daily, with levels ranging between 130-159. She exercises with a foot pedal bike, takes her medications, and monitors her diet. I advised her to maintain her regimen.         Our next appointment is by telephone on 10/5 at 1 pm  Please call the care guide team at [phone] if you need to cancel or reschedule your appointment.   If you are experiencing a Mental Health or [place] or need someone to talk to, please call 1-800-273-TALK (toll free, 24 hour hotline)  Patient verbalizes understanding of instructions and care plan provided today and agrees to view in [place]. Active MyChart status and patient understanding of how to access instructions and care plan via MyChart confirmed with patient.     Lazaro Arms RN, BSN, [place] [facility]   Phone: [phone]

## 2022-08-05 ENCOUNTER — Other Ambulatory Visit: Payer: Self-pay | Admitting: Family Medicine

## 2022-08-06 ENCOUNTER — Encounter: Payer: Self-pay | Admitting: Podiatry

## 2022-08-06 ENCOUNTER — Ambulatory Visit (INDEPENDENT_AMBULATORY_CARE_PROVIDER_SITE_OTHER): Payer: Medicare Other | Admitting: Podiatry

## 2022-08-06 DIAGNOSIS — E1140 Type 2 diabetes mellitus with diabetic neuropathy, unspecified: Secondary | ICD-10-CM | POA: Diagnosis not present

## 2022-08-06 DIAGNOSIS — E1340 Other specified diabetes mellitus with diabetic neuropathy, unspecified: Secondary | ICD-10-CM

## 2022-08-06 DIAGNOSIS — B351 Tinea unguium: Secondary | ICD-10-CM

## 2022-08-06 DIAGNOSIS — M2010 Hallux valgus (acquired), unspecified foot: Secondary | ICD-10-CM

## 2022-08-06 NOTE — Progress Notes (Signed)
This patient returns to my office for at risk foot care.  This patient requires this care by a professional since this patient will be at risk due to having diabetes.   This patient is unable to cut nails herself since the patient cannot reach her nails.These nails are painful walking and wearing shoes.  This patient presents for at risk foot care today.  General Appearance  Alert, conversant and in no acute stress.  Vascular  Dorsalis pedis and posterior tibial  pulses are palpable  bilaterally.  Capillary return is within normal limits  bilaterally. Temperature is within normal limits  bilaterally.  Neurologic  Senn-Weinstein monofilament wire test absent   bilaterally. Muscle power within normal limits bilaterally.  Nails Thick disfigured discolored nails with subungual debris  from hallux to fifth toes bilaterally. No evidence of bacterial infection or drainage bilaterally.  Orthopedic  No limitations of motion  feet .  No crepitus or effusions noted.  No bony pathology or digital deformities noted. Asymptomatic  HAV  B/L.  Pes planus  B/L. Hammer toes second left foot.  Skin  normotropic skin with no porokeratosis noted bilaterally.  No signs of infections or ulcers noted.     Onychomycosis  Pain in right toes  Pain in left toes    Consent was obtained for treatment procedures.   Mechanical debridement of nails 1-5  bilaterally performed with a nail nipper.  Filed with dremel without incident. Asks to see Rick.   Return office visit  3 months                    Told patient to return for periodic foot care and evaluation due to potential at risk complications   Maston Wight DPM  

## 2022-08-09 NOTE — Progress Notes (Unsigned)
    SUBJECTIVE:   CHIEF COMPLAINT / HPI:   Diabetes Blood sugars in low to mid 100s.  No low blood sugar.  Taking all medications without problems  Hypertension Her meter reads irregular sometimes.  Usually in 130-140/ 0-80s.  Took her medications about 2-3 hours ago.  Rare lightheadness when stands    Irregular Heart Rate Noticed on her blood pressure machine at home.  No syncope or persistent lightheadness or increased edema  Gout No attacks  Neuropathy Taking 100 mg in am and 300 mg in pm.  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 Lungs are clear to auscultation, no crackles or wheezes. Extrem - wearing compression stockings trace pitting edema  ECG - sinus brady with rate in 40s ASSESSMENT/PLAN:   Essential hypertension I think her home readings and in office machine readings may not be accurate due to her bradycardia.  My manual was much lower.  Will cut back on her metoprolol and recheck her in one month.    Nephropathy, diabetic ([place]) Continue to cut back on gabapentin   Type 2 diabetes mellitus with diabetic foot deformity (HCC) Much improved on lantus and ozempic.  Continue current medications   Irregular heart rate Sinus bradycardia on ECG.  Cut down on metoprolol to 12.5 daily and monitor    Patient Instructions  Good to see you today - Thank you for coming in  Things we discussed today:  Leg Pain Decrease gabapentin to 100 mg in AM and 200 mg at night   BP Take 1/2 of Metoprolol ER tablet daily  Please always bring your medication bottles  Come back to see me in 1 month to check your pulse and blood pressure and  get your shots    Lind Covert, MD [place]

## 2022-08-10 ENCOUNTER — Encounter: Payer: Self-pay | Admitting: Family Medicine

## 2022-08-10 ENCOUNTER — Ambulatory Visit (INDEPENDENT_AMBULATORY_CARE_PROVIDER_SITE_OTHER): Payer: Medicare Other | Admitting: Family Medicine

## 2022-08-10 ENCOUNTER — Other Ambulatory Visit: Payer: Self-pay

## 2022-08-10 ENCOUNTER — Ambulatory Visit (HOSPITAL_COMMUNITY)
Admission: RE | Admit: 2022-08-10 | Discharge: 2022-08-10 | Disposition: A | Discharge status: Home / Self Care | Payer: Medicare Other | Source: Ambulatory Visit | Attending: Family Medicine | Admitting: Family Medicine

## 2022-08-10 VITALS — BP 132/74 | HR 73 | Wt 217.0 lb

## 2022-08-10 DIAGNOSIS — M21969 Unspecified acquired deformity of unspecified lower leg: Secondary | ICD-10-CM

## 2022-08-10 DIAGNOSIS — E0821 Diabetes mellitus due to underlying condition with diabetic nephropathy: Secondary | ICD-10-CM

## 2022-08-10 DIAGNOSIS — I499 Cardiac arrhythmia, unspecified: Secondary | ICD-10-CM | POA: Insufficient documentation

## 2022-08-10 DIAGNOSIS — I10 Essential (primary) hypertension: Secondary | ICD-10-CM | POA: Diagnosis not present

## 2022-08-10 DIAGNOSIS — E1169 Type 2 diabetes mellitus with other specified complication: Secondary | ICD-10-CM

## 2022-08-10 LAB — POCT GLYCOSYLATED HEMOGLOBIN (HGB A1C): HbA1c, POC (controlled diabetic range): 6.9 % (ref 0.0–7.0)

## 2022-08-10 NOTE — Assessment & Plan Note (Signed)
Much improved on lantus and ozempic.  Continue current medications

## 2022-08-10 NOTE — Assessment & Plan Note (Signed)
I think her home readings and in office machine readings may not be accurate due to her bradycardia.  My manual was much lower.  Will cut back on her metoprolol and recheck her in one month.

## 2022-08-10 NOTE — Patient Instructions (Addendum)
Good to see you today - Thank you for coming in  Things we discussed today:  Leg Pain Decrease gabapentin to 100 mg in AM and 200 mg at night   BP Take 1/2 of Metoprolol ER tablet daily  Please always bring your medication bottles  Come back to see me in 1 month to check your pulse and blood pressure and get your shots

## 2022-08-10 NOTE — Assessment & Plan Note (Signed)
Sinus bradycardia on ECG.  Cut down on metoprolol to 12.5 daily and monitor

## 2022-08-10 NOTE — Assessment & Plan Note (Signed)
Continue to cut back on gabapentin

## 2022-08-10 NOTE — Assessment & Plan Note (Signed)
>>  ASSESSMENT AND PLAN FOR NEPHROPATHY, DIABETIC (HCC) WRITTEN ON 08/10/2022 10:31 AM BY Mally Gavina L, MD  Continue to cut back on gabapentin

## 2022-09-02 ENCOUNTER — Ambulatory Visit: Payer: Self-pay

## 2022-09-02 ENCOUNTER — Other Ambulatory Visit: Payer: Self-pay | Admitting: Family Medicine

## 2022-09-02 NOTE — Patient Instructions (Signed)
Visit Information  Thank you for taking time to visit with me today. Please don't hesitate to contact me if I can be of assistance to you.   Following are the goals we discussed today:   Goals Addressed             This Visit's Progress    I want to monitor and mange my blood sugars        Care Coordination Interventions:  Reviewed medications with patient and discussed importance of medication adherence Discussed plans with patient for ongoing care management follow up and provided patient with direct contact information for care management team Reviewed scheduled/upcoming provider appointments including: 08/10/22 Dr Evaristo Bury check Advised patient, providing education and rationale, to check cbg  and record, calling the office for findings outside established parameters Review of patient status, including review of consultants reports, relevant laboratory and other test results, and medications completed Active listening / Reflection utilized  Emotional Support Provided Problem [place] strategies reviewed During my conversation with [name] today, she reported having an A1c level of 6.9 and a blood sugar reading of 145 this morning. She mentioned that she is adhering to her medication regimen and maintaining a healthy lifestyle in terms of eating and sleeping habits. However, she did express some concern about her slow and irregular heartbeat. She has a follow up appointment on 11/06/22.        Our next appointment is by telephone on 11/9 at 11 am  Please call the care guide team at [phone] if you need to cancel or reschedule your appointment.   If you are experiencing a Mental Health or [place] or need someone to talk to, please call 1-800-273-TALK (toll free, 24 hour hotline)  Patient verbalizes understanding of instructions and care plan provided today and agrees to view in [place]. Active MyChart status and patient understanding of how  to access instructions and care plan via MyChart confirmed with patient.      Lazaro Arms RN, BSN, [facility]   Phone: [phone]

## 2022-09-02 NOTE — Patient Outreach (Signed)
  Care Coordination   Follow Up Visit Note   51/06/8415 Name: [name] MRN: [id number] DOB: [birth year]  [name] is a 71 y.o. year old female who sees Chambliss, Jeb Levering, MD for primary care. I spoke with  [name] by phone today.  What matters to the patients health and wellness today?  My A1c level has come down    Goals Addressed             This Visit's Progress    I want to monitor and mange my blood sugars        Care Coordination Interventions:  Reviewed medications with patient and discussed importance of medication adherence Discussed plans with patient for ongoing care management follow up and provided patient with direct contact information for care management team Reviewed scheduled/upcoming provider appointments including: 08/10/22 Dr Evaristo Bury check Advised patient, providing education and rationale, to check cbg  and record, calling the office for findings outside established parameters Review of patient status, including review of consultants reports, relevant laboratory and other test results, and medications completed Active listening / Reflection utilized  Emotional Support Provided Problem [place] strategies reviewed During my conversation with [name] today, she reported having an A1c level of 6.9 and a blood sugar reading of 145 this morning. She mentioned that she is adhering to her medication regimen and maintaining a healthy lifestyle in terms of eating and sleeping habits. However, she did express some concern about her slow and irregular heartbeat. She has a follow up appointment on 11/06/22.        SDOH assessments and interventions completed:  No     Care Coordination Interventions Activated:  Yes  Care Coordination Interventions:  Yes, provided   Follow up plan: Follow up call scheduled for 11/9 11 am    Encounter Outcome:  Pt. Visit Completed   Lazaro Arms RN, BSN, [facility]   Phone: [phone]

## 2022-09-05 NOTE — Progress Notes (Unsigned)
    SUBJECTIVE:   CHIEF COMPLAINT / HPI:   PERTINENT  PMH / PSH: ***  OBJECTIVE:   There were no vitals taken for this visit.  ***  ASSESSMENT/PLAN:   No problem-specific Assessment & Plan notes found for this encounter.     Lind Covert, MD [place]

## 2022-09-06 ENCOUNTER — Ambulatory Visit (INDEPENDENT_AMBULATORY_CARE_PROVIDER_SITE_OTHER): Payer: Medicare Other | Admitting: Family Medicine

## 2022-09-06 ENCOUNTER — Other Ambulatory Visit: Payer: Self-pay

## 2022-09-06 ENCOUNTER — Encounter: Payer: Self-pay | Admitting: Family Medicine

## 2022-09-06 VITALS — BP 156/82 | HR 77 | Wt 217.2 lb

## 2022-09-06 DIAGNOSIS — I10 Essential (primary) hypertension: Secondary | ICD-10-CM | POA: Diagnosis not present

## 2022-09-06 DIAGNOSIS — Z23 Encounter for immunization: Secondary | ICD-10-CM | POA: Diagnosis not present

## 2022-09-06 DIAGNOSIS — M25512 Pain in left shoulder: Secondary | ICD-10-CM | POA: Diagnosis not present

## 2022-09-06 DIAGNOSIS — E1340 Other specified diabetes mellitus with diabetic neuropathy, unspecified: Secondary | ICD-10-CM | POA: Diagnosis not present

## 2022-09-06 DIAGNOSIS — G8929 Other chronic pain: Secondary | ICD-10-CM | POA: Diagnosis not present

## 2022-09-06 NOTE — Patient Instructions (Addendum)
It was nice to see you today.  For your calf pain/burning, increase your Gabapentin to '200mg'$  in the day and '200mg'$  at night.  Continue to check your blood pressures at home. Continue to take your blood pressure medications.  Call or message [place] on MyChart if you would like to start physical therapy for your shoulder.  Thank you!

## 2022-09-06 NOTE — Assessment & Plan Note (Addendum)
Worsened neuropathy. Patient with new bilateral calf tingling after decreasing Gabapentin. Increase Gabapentin to '200mg'$  in the morning and '200mg'$  at night.

## 2022-09-06 NOTE — Assessment & Plan Note (Signed)
Likely due to glenohumeral osteoarthritis or rotator cuff tendinopathy. Given history of breast cancer, bone metastasis considered however her breast cancer is in remission. Physical exam and chronicity of pain reduces likelihood of infection or fracture. Discussed options including surgery and physical therapy. Patient is not interested in surgical options. Recommended physical therapy to increase range of motion and strength. Patient to consider and follow-up. If pain worsens, will consider imaging.

## 2022-09-06 NOTE — Progress Notes (Signed)
  Date of Visit: 21/97/5883   HPI:  [name] is a 71 y.o. here for f/u:  Hypertension and irregular heart rate: Patient is taking Losartan '100mg'$ , Metoprolol 12.'5mg'$ , and Spironolactone '25mg'$ . She checks her home blood pressures and they are in the 130s/80s. Her machine informed her she had irregular heart rate x2 in the last month. She reports home heart rates have been >60bpm. She denies chest pain, dizziness, lightheadedness, syncope, and palpitations.  Calf pain: Patient reports she has 3 weeks of bilateral calf burning and tingling. She denies leg edema, pain with ambulation, or skin changes. She takes Gabapentin '100mg'$  in the morning and '200mg'$  in the evening. The pain is constant and does not change throughout the day.  Left shoulder pain: Patient reports she has intermittent left shoulder pain for 2-3 weeks. She had similar pain in the past and was managed by physical therapy. She takes Tylenol and the pain does not resolve. She has chronic limited range-of-motion of the left shoulder.  PHYSICAL EXAM: BP (!) 156/82   Pulse 77   Wt 217 lb 3.2 oz (98.5 kg)   SpO2 99%   BMI 39.73 kg/m  Gen: Well appearing in no distress CV: Irregular rate. No murmurs, rubs, or gallops.  Pulm: Normal work of breathing. No wheezing or crackles. MSK: Calves are non-tender to palpation. No BLE pitting edema. Left shoulder tender to palpation. Moderate pain and limited ROM with left shoulder cross-body adduction, internal rotation, and external rotation. Moderate pain with left shoulder abduction. ROM of left shoulder abduction is limited to 110 degrees. Neuro: Alert and oriented. Good grip strength with left hand. Radial pulses present.  ASSESSMENT/PLAN:  Essential hypertension Patient's blood pressures are elevated in office: 170/103, 158/80, 156/82. Home blood pressures are within goal <140/90. Continue current regimen.   Neuropathy due to secondary diabetes ([place]) Worsened neuropathy. Patient  with new bilateral calf tingling after decreasing Gabapentin. Increase Gabapentin to '200mg'$  in the morning and '200mg'$  at night.  Left shoulder pain Likely due to glenohumeral osteoarthritis or rotator cuff tendinopathy. Given history of breast cancer, bone metastasis considered however her breast cancer is in remission. Physical exam and chronicity of pain reduces likelihood of infection or fracture. Discussed options including surgery and physical therapy. Patient is not interested in surgical options. Recommended physical therapy to increase range of motion and strength. Patient to consider and follow-up. If pain worsens, will consider imaging.  Health maintenance Flu vaccine today. Follow-up at pharmacy for Shingles and COVID vaccines.  Follow-up Return in 3 months for routine follow-up.  [facility]  I was present during key history taking and physical exam and any procedures.  I agree with the note above Samella Parr MD

## 2022-09-06 NOTE — Assessment & Plan Note (Signed)
Patient's blood pressures are elevated in office: 170/103, 158/80, 156/82. Home blood pressures are within goal <140/90. Continue current regimen.

## 2022-09-08 ENCOUNTER — Telehealth: Payer: Self-pay

## 2022-09-08 NOTE — Patient Outreach (Signed)
  Care Coordination   Telephone Call  Visit Note   21/19/4174 Name: [name] MRN: [id number] DOB: [birth year]  [name] is a 71 y.o. year old female who sees Chambliss, Jeb Levering, MD for primary care. I spoke with  [name] by phone today.  What matters to the patients health and wellness today?  Covid -19 Booster  The patient left me a message expressing her interest in receiving the COVID-19 booster. She also informed me that her previous RN had the shot given to her at home. I contacted my co-worker to inquire about the program, only to be told that it was no longer available. The patient is not homebound and does not qualify for the other program.  I then called [facility] to inquire about the availability of the shots and was informed that they should be available soon. Once I receive confirmation, I will notify the patient immediately. I later contacted the patient to pass on this information, and she was okay with it.   Goals Addressed   None     SDOH assessments and interventions completed:  No     Care Coordination Interventions Activated:  Yes  Care Coordination Interventions:  Yes, provided   Follow up plan:  next scheduled interval    Encounter Outcome:  Pt. Visit Completed   Lazaro Arms RN, BSN, [facility]   Phone: [phone]

## 2022-09-21 ENCOUNTER — Telehealth: Payer: Self-pay

## 2022-09-21 NOTE — Patient Outreach (Signed)
  Care Coordination   Follow Up Visit Note   96/22/2979 Name: AKIERA ALLBAUGH MRN: [id number] DOB: [birth year]  TELINA KLECKLEY is a 71 y.o. year old female who sees Chambliss, Jeb Levering, MD for primary care. I spoke with  [name] by phone today.  What matters to the patients health and wellness today?  Covid shot    Goals Addressed             This Visit's Progress    COMPLETED: Care Coordination Activites -No follow up required       Care Coordination Interventions:  I called the patient and informed her that I scheduled an appointment for the Covid shot on Tuesday, 09/28/22, at 10 a.m. per her request at [place].        SDOH assessments and interventions completed:  No     Care Coordination Interventions Activated:   {THN Tip this will not be part of the note when signed-REQUIRED REPORT FIELD YesDO NOT DELETE (Optional):27901} Care Coordination Interventions:  Yes, provided   Follow up plan:  Next scheduled interval    Encounter Outcome:  Pt. Visit Completed   Lazaro Arms RN, BSN, [facility]   Phone: [phone]

## 2022-09-27 ENCOUNTER — Other Ambulatory Visit: Payer: Self-pay | Admitting: Family Medicine

## 2022-09-27 DIAGNOSIS — E1169 Type 2 diabetes mellitus with other specified complication: Secondary | ICD-10-CM

## 2022-09-28 ENCOUNTER — Ambulatory Visit: Payer: Medicare Other

## 2022-10-01 ENCOUNTER — Ambulatory Visit (INDEPENDENT_AMBULATORY_CARE_PROVIDER_SITE_OTHER): Payer: Medicare Other

## 2022-10-01 DIAGNOSIS — Z23 Encounter for immunization: Secondary | ICD-10-CM | POA: Diagnosis not present

## 2022-10-01 NOTE — Progress Notes (Signed)
   Covid-19 Vaccination Clinic  Name:  ZAELYNN FUCHS    MRN: [id number] DOB: Jul 22, 1951  10/01/2022  Ms. Tomlin was observed post Covid-19 immunization for 15 minutes without incident. She was provided with Vaccine Information Sheet and instruction to access the V-Safe system.   Ms. Krontz was instructed to call 911 with any severe reactions post vaccine: Difficulty breathing  Swelling of face and throat  A fast heartbeat  A bad rash all over body  Dizziness and weakness   Talbot Grumbling, RN

## 2022-10-07 ENCOUNTER — Ambulatory Visit: Payer: Self-pay

## 2022-10-07 NOTE — Patient Outreach (Signed)
  Care Coordination   Follow Up Visit Note   09/06/3234 Name: [name] MRN: [id number] DOB: [birth year]  [name] is a 71 y.o. year old female who sees Chambliss, Jeb Levering, MD for primary care. I spoke with  [name] by phone today.  What matters to the patients health and wellness today?  I feel good today.    Goals Addressed             This Visit's Progress    I want to monitor and mange my blood sugars        Care Coordination Interventions:  Reviewed medications with patient and discussed importance of medication adherence Discussed plans with patient for ongoing care management follow up and provided patient with direct contact information for care management team Reviewed scheduled/upcoming provider appointments including: 08/10/22 Dr Evaristo Bury check Advised patient, providing education and rationale, to check cbg  and record, calling the office for findings outside established parameters Review of patient status, including review of consultants reports, relevant laboratory and other test results, and medications completed Active listening / Reflection utilized  Emotional Support Provided Problem [name] strategies reviewed [name] reported feeling well during her recent visit. Her blood sugar levels ranged from 156 to 212. She mentioned that her levels tend to be higher when she eats late at night. We agreed to schedule her next appointment in January after her upcoming A1c check. This will help [place] determine if any changes need to be made to her treatment plan. She will contniue to take her meds as prescribed, check her blood sugars and record the values and monitor her food intake.        SDOH assessments and interventions completed:  No     Care Coordination Interventions Activated:  Yes  Care Coordination Interventions:  Yes, provided   Follow up plan: Follow up call scheduled for 1/17 11 am    Encounter Outcome:  Pt. Visit  Completed   Lazaro Arms RN, BSN, [facility]   Phone: [phone]

## 2022-10-07 NOTE — Patient Outreach (Deleted)
{  CARE COORDINATION NOTES:27886} 

## 2022-10-07 NOTE — Patient Instructions (Signed)
Visit Information  Thank you for taking time to visit with me today. Please don't hesitate to contact me if I can be of assistance to you.   Following are the goals we discussed today:   Goals Addressed             This Visit's Progress    I want to monitor and mange my blood sugars        Care Coordination Interventions:  Reviewed medications with patient and discussed importance of medication adherence Discussed plans with patient for ongoing care management follow up and provided patient with direct contact information for care management team Reviewed scheduled/upcoming provider appointments including: 08/10/22 Dr Evaristo Bury check Advised patient, providing education and rationale, to check cbg  and record, calling the office for findings outside established parameters Review of patient status, including review of consultants reports, relevant laboratory and other test results, and medications completed Active listening / Reflection utilized  Emotional Support Provided Problem [place] strategies reviewed Ms. Kocher reported feeling well during her recent visit. Her blood sugar levels ranged from 156 to 212. She mentioned that her levels tend to be higher when she eats late at night. We agreed to schedule her next appointment in January after her upcoming A1c check. This will help [place] determine if any changes need to be made to her treatment plan. She will contniue to take her meds as prescribed, check her blood sugars and record the values and monitor her food intake.        Our next appointment is by telephone on 1/17 at 11 am  Please call the care guide team at [phone] if you need to cancel or reschedule your appointment.   If you are experiencing a Mental Health or [place] or need someone to talk to, please call 1-800-273-TALK (toll free, 24 hour hotline)  Patient verbalizes understanding of instructions and care plan provided today and  agrees to view in [place]. Active MyChart status and patient understanding of how to access instructions and care plan via MyChart confirmed with patient.     Lazaro Arms RN, BSN, [facility]   Phone: [phone]

## 2022-10-16 ENCOUNTER — Other Ambulatory Visit: Payer: Self-pay | Admitting: Family Medicine

## 2022-11-02 ENCOUNTER — Other Ambulatory Visit: Payer: Self-pay | Admitting: Family Medicine

## 2022-11-05 ENCOUNTER — Ambulatory Visit (INDEPENDENT_AMBULATORY_CARE_PROVIDER_SITE_OTHER): Payer: Medicare Other | Admitting: Podiatry

## 2022-11-05 ENCOUNTER — Encounter: Payer: Self-pay | Admitting: Podiatry

## 2022-11-05 VITALS — BP 181/90

## 2022-11-05 DIAGNOSIS — B351 Tinea unguium: Secondary | ICD-10-CM | POA: Diagnosis not present

## 2022-11-05 DIAGNOSIS — M2010 Hallux valgus (acquired), unspecified foot: Secondary | ICD-10-CM

## 2022-11-05 DIAGNOSIS — E1140 Type 2 diabetes mellitus with diabetic neuropathy, unspecified: Secondary | ICD-10-CM

## 2022-11-05 DIAGNOSIS — E1340 Other specified diabetes mellitus with diabetic neuropathy, unspecified: Secondary | ICD-10-CM

## 2022-11-05 NOTE — Progress Notes (Signed)
This patient returns to my office for at risk foot care.  This patient requires this care by a professional since this patient will be at risk due to having diabetes.   This patient is unable to cut nails herself since the patient cannot reach her nails.These nails are painful walking and wearing shoes.  This patient presents for at risk foot care today.  General Appearance  Alert, conversant and in no acute stress.  Vascular  Dorsalis pedis and posterior tibial  pulses are palpable  bilaterally.  Capillary return is within normal limits  bilaterally. Temperature is within normal limits  bilaterally.  Neurologic  Senn-Weinstein monofilament wire test absent   bilaterally. Muscle power within normal limits bilaterally.  Nails Thick disfigured discolored nails with subungual debris  from hallux to fifth toes bilaterally. No evidence of bacterial infection or drainage bilaterally.  Orthopedic  No limitations of motion  feet .  No crepitus or effusions noted.  No bony pathology or digital deformities noted. Asymptomatic  HAV  B/L.  Pes planus  B/L. Hammer toes second left foot.  Skin  normotropic skin with no porokeratosis noted bilaterally.  No signs of infections or ulcers noted.     Onychomycosis  Pain in right toes  Pain in left toes    Consent was obtained for treatment procedures.   Mechanical debridement of nails 1-5  bilaterally performed with a nail nipper.  Filed with dremel without incident. Asks to see Liliane Channel.   Return office visit  3 months                    Told patient to return for periodic foot care and evaluation due to potential at risk complications   Gardiner Barefoot DPM

## 2022-12-02 ENCOUNTER — Other Ambulatory Visit: Payer: Self-pay | Admitting: Family Medicine

## 2022-12-02 DIAGNOSIS — E1169 Type 2 diabetes mellitus with other specified complication: Secondary | ICD-10-CM

## 2022-12-06 NOTE — Progress Notes (Unsigned)
    SUBJECTIVE:   CHIEF COMPLAINT / HPI:   Diabetes No low blood sugar.  Brings all her medications  Hypertension No falls or lightheadness.  Taking medications as listed BP Readings from Last 3 Encounters:  12/07/22 126/82  11/05/22 (!) 181/90  09/06/22 (!) 156/82    R Hand Swelling and Pain For the last few weeks slow onset of swelling and warmth and pain.  No trauma.  No other joints swollen.  Taking allopurinol and colchicine daily.  Using tyelenol and aspercreme      OBJECTIVE:   BP 126/82   Pulse 83   Wt 216 lb 12.8 oz (98.3 kg)   SpO2 100%   BMI 39.65 kg/m   R Hand - noticeably diffusely swollen compared to left with increased warmth.  No focal joint swelling although MCP and PIP joints seem most tender,  Decreased range of motion diffusely due to pain  Able to stand walk using her cane caring her medications and large purse without problems   ASSESSMENT/PLAN:   Type 2 diabetes mellitus with diabetic foot deformity (HCC) Assessment & Plan: Worsened today she feels due to the holidays.  Continue current medications and see back in 58month   Orders: -     POCT glycosylated hemoglobin (Hb A1C)  Idiopathic gout, unspecified chronicity, unspecified site -     Uric acid  Hand pain, right Assessment & Plan: Appears to be an inflamatory arthritis could be DJD flare vs gout vs undiscovered RA on SLE.  Will check labs.    Orders: -     ANA -     Rheumatoid factor  Essential hypertension Assessment & Plan: Controlled today.  Continue medications and check CMET  Orders: -     CMP14+EGFR     Patient Instructions  Good to see you today - Thank you for coming in  Things we discussed today:  R Hand swelling - will check blood tests and let you know the results.  Continue Asper creme and tylenol   I agree you should get back on your diet schedule  Please always bring your medication bottles  Come back to see me in 3 months    MLind Covert  MRozel

## 2022-12-07 ENCOUNTER — Encounter: Payer: Self-pay | Admitting: Family Medicine

## 2022-12-07 ENCOUNTER — Ambulatory Visit (INDEPENDENT_AMBULATORY_CARE_PROVIDER_SITE_OTHER): Payer: Medicare Other | Admitting: Family Medicine

## 2022-12-07 VITALS — BP 126/82 | HR 83 | Wt 216.8 lb

## 2022-12-07 DIAGNOSIS — E1169 Type 2 diabetes mellitus with other specified complication: Secondary | ICD-10-CM | POA: Diagnosis not present

## 2022-12-07 DIAGNOSIS — I10 Essential (primary) hypertension: Secondary | ICD-10-CM

## 2022-12-07 DIAGNOSIS — M1000 Idiopathic gout, unspecified site: Secondary | ICD-10-CM | POA: Diagnosis not present

## 2022-12-07 DIAGNOSIS — M21969 Unspecified acquired deformity of unspecified lower leg: Secondary | ICD-10-CM | POA: Diagnosis not present

## 2022-12-07 DIAGNOSIS — M79641 Pain in right hand: Secondary | ICD-10-CM

## 2022-12-07 LAB — POCT GLYCOSYLATED HEMOGLOBIN (HGB A1C): HbA1c, POC (controlled diabetic range): 7.8 % — AB (ref 0.0–7.0)

## 2022-12-07 NOTE — Patient Instructions (Signed)
Good to see you today - Thank you for coming in  Things we discussed today:  R Hand swelling - will check blood tests and let you know the results.  Continue Asper creme and tylenol   I agree you should get back on your diet schedule  Please always bring your medication bottles  Come back to see me in 3 months

## 2022-12-07 NOTE — Assessment & Plan Note (Signed)
Controlled today.  Continue medications and check CMET

## 2022-12-07 NOTE — Assessment & Plan Note (Signed)
Worsened today she feels due to the holidays.  Continue current medications and see back in 17month

## 2022-12-07 NOTE — Assessment & Plan Note (Signed)
Appears to be an inflamatory arthritis could be DJD flare vs gout vs undiscovered RA on SLE.  Will check labs.

## 2022-12-08 LAB — CMP14+EGFR
ALT: 20 IU/L (ref 0–32)
AST: 23 IU/L (ref 0–40)
Albumin/Globulin Ratio: 1.7 (ref 1.2–2.2)
Albumin: 4.3 g/dL (ref 3.8–4.8)
Alkaline Phosphatase: 100 IU/L (ref 44–121)
BUN/Creatinine Ratio: 20 (ref 12–28)
BUN: 26 mg/dL (ref 8–27)
Bilirubin Total: 0.6 mg/dL (ref 0.0–1.2)
CO2: 20 mmol/L (ref 20–29)
Calcium: 9.2 mg/dL (ref 8.7–10.3)
Chloride: 101 mmol/L (ref 96–106)
Creatinine, Ser: 1.32 mg/dL — ABNORMAL HIGH (ref 0.57–1.00)
Globulin, Total: 2.5 g/dL (ref 1.5–4.5)
Glucose: 154 mg/dL — ABNORMAL HIGH (ref 70–99)
Potassium: 4.7 mmol/L (ref 3.5–5.2)
Sodium: 139 mmol/L (ref 134–144)
Total Protein: 6.8 g/dL (ref 6.0–8.5)
eGFR: 43 mL/min/{1.73_m2} — ABNORMAL LOW (ref >59)

## 2022-12-08 LAB — URIC ACID: Uric Acid: 6.5 mg/dL (ref 3.1–7.9)

## 2022-12-08 LAB — ANA: Anti Nuclear Antibody (ANA): NEGATIVE

## 2022-12-08 LAB — RHEUMATOID FACTOR: Rheumatoid fact SerPl-aCnc: 11.7 IU/mL (ref <14.0)

## 2022-12-15 ENCOUNTER — Ambulatory Visit: Payer: Self-pay

## 2022-12-16 NOTE — Patient Outreach (Signed)
  Care Coordination   Follow Up Visit Note   07/23/538 Name: ZHANAE PROFFIT MRN: [id number] DOB: [birth year]  DALAYSIA HARMS is a 72 y.o. year old female who sees Chambliss, Jeb Levering, MD for primary care. I spoke with  [name] by phone today.  What matters to the patients health and wellness today?  I am getting back on track    Goals Addressed             This Visit's Progress    I want to monitor and mange my blood sugars        Care Coordination Interventions:  Reviewed medications with patient and discussed importance of medication adherence Discussed plans with patient for ongoing care management follow up and provided patient with direct contact information for care management team Reviewed scheduled/upcoming provider appointments including: 08/10/22 Dr Evaristo Bury check Advised patient, providing education and rationale, to check cbg  and record, calling the office for findings outside established parameters Review of patient status, including review of consultants reports, relevant laboratory and other test results, and medications completed Active listening / Reflection utilized  Emotional Support Provided Problem [place] strategies reviewed Mrs. Sobel is currently doing well. She recently had a wonderful holiday and enjoyed some good food during the break. However, her A1c levels have increased from 7.3 to 7.8. She is aware of the reasons behind the increase and is keeping a close eye on her diet. Her blood sugar level this morning was 179. She stays active by doing household chores. She has a follow-up appointment scheduled for 02/08/23 to recheck her DM, eye appointment on 12/28/22 and a nephrology appointment on 12/22/22.        SDOH assessments and interventions completed:  No     Care Coordination Interventions:  Yes, provided   Follow up plan: Follow up call scheduled for 01/18/23 11 am    Encounter Outcome:  Pt. Visit Completed   Lazaro Arms RN, BSN, [facility]   Phone: [phone]

## 2022-12-16 NOTE — Patient Instructions (Signed)
Visit Information  Thank you for taking time to visit with me today. Please don't hesitate to contact me if I can be of assistance to you.   Following are the goals we discussed today:   Goals Addressed             This Visit's Progress    I want to monitor and mange my blood sugars        Care Coordination Interventions:  Reviewed medications with patient and discussed importance of medication adherence Discussed plans with patient for ongoing care management follow up and provided patient with direct contact information for care management team Reviewed scheduled/upcoming provider appointments including: 08/10/22 Dr Evaristo Bury check Advised patient, providing education and rationale, to check cbg  and record, calling the office for findings outside established parameters Review of patient status, including review of consultants reports, relevant laboratory and other test results, and medications completed Active listening / Reflection utilized  Emotional Support Provided Problem [place] strategies reviewed [name] is currently doing well. She recently had a wonderful holiday and enjoyed some good food during the break. However, her A1c levels have increased from 7.3 to 7.8. She is aware of the reasons behind the increase and is keeping a close eye on her diet. Her blood sugar level this morning was 179. She stays active by doing household chores. She has a follow-up appointment scheduled for 02/08/23 to recheck her DM, eye appointment on 12/28/22 and a nephrology appointment on 12/22/22.        Our next appointment is by telephone on 01/18/23 at 11 am  Please call the care guide team at [phone] if you need to cancel or reschedule your appointment.   If you are experiencing a Mental Health or [place] or need someone to talk to, please call 1-800-273-TALK (toll free, 24 hour hotline)  Patient verbalizes understanding of instructions and care  plan provided today and agrees to view in [place]. Active MyChart status and patient understanding of how to access instructions and care plan via MyChart confirmed with patient.     Kirsten Arms RN, BSN, [facility]   Phone: [phone]

## 2022-12-16 NOTE — Patient Outreach (Signed)
  Care Coordination   Follow Up Visit Note   12/16/2022 Late Entry Name: [name] MRN: [id number] DOB: [birth year]  [name] is a 72 y.o. year old female who sees Chambliss, Jeb Levering, MD for primary care. I spoke with  [name] by phone today.  What matters to the patients health and wellness today?  I am getting back on track    Goals Addressed             This Visit's Progress    I want to monitor and mange my blood sugars        Care Coordination Interventions:  Reviewed medications with patient and discussed importance of medication adherence Discussed plans with patient for ongoing care management follow up and provided patient with direct contact information for care management team Reviewed scheduled/upcoming provider appointments including: 08/10/22 Dr Evaristo Bury check Advised patient, providing education and rationale, to check cbg  and record, calling the office for findings outside established parameters Review of patient status, including review of consultants reports, relevant laboratory and other test results, and medications completed Active listening / Reflection utilized  Emotional Support Provided Problem [name] reviewed [name] is currently doing well. She recently had a wonderful holiday and enjoyed some good food during the break. However, her A1c levels have increased from 7.3 to 7.8. She is aware of the reasons behind the increase and is keeping a close eye on her diet. Her blood sugar level this morning was 179. She stays active by doing household chores. She has a follow-up appointment scheduled for 02/08/23 to recheck her DM, eye appointment on 12/28/22 and a nephrology appointment on 12/22/22.        SDOH assessments and interventions completed:  No     Care Coordination Interventions:  Yes, provided   Follow up plan: Follow up call scheduled for 01/18/23 11 am    Encounter Outcome:  Pt. Visit Completed    Lazaro Arms RN, BSN, [facility]   Phone: [phone]

## 2022-12-25 ENCOUNTER — Other Ambulatory Visit: Payer: Self-pay | Admitting: Family Medicine

## 2022-12-25 DIAGNOSIS — I10 Essential (primary) hypertension: Secondary | ICD-10-CM

## 2022-12-28 DIAGNOSIS — H401113 Primary open-angle glaucoma, right eye, severe stage: Secondary | ICD-10-CM | POA: Diagnosis not present

## 2022-12-28 DIAGNOSIS — H401121 Primary open-angle glaucoma, left eye, mild stage: Secondary | ICD-10-CM | POA: Diagnosis not present

## 2022-12-28 DIAGNOSIS — E119 Type 2 diabetes mellitus without complications: Secondary | ICD-10-CM | POA: Diagnosis not present

## 2022-12-29 ENCOUNTER — Other Ambulatory Visit: Payer: Self-pay

## 2022-12-29 DIAGNOSIS — N6324 Unspecified lump in the left breast, lower inner quadrant: Secondary | ICD-10-CM

## 2022-12-29 DIAGNOSIS — C50511 Malignant neoplasm of lower-outer quadrant of right female breast: Secondary | ICD-10-CM

## 2022-12-29 NOTE — Progress Notes (Unsigned)
[name]   [phone] [phone]   Clinic Follow up Note   Patient Care Team: Lind Covert, MD as PCP - General (Family Medicine) Gevena Cotton, MD (Ophthalmology) Rana Snare, MD (Inactive) (Urology) Dr. Edwin Cap (Dental General Practice) Fanny Skates, MD as Consulting Physician (General Surgery) Holley Bouche, NP (Inactive) as Nurse Practitioner (Nurse Practitioner) Truitt Merle, MD as Consulting Physician (Hematology) Gardiner Barefoot, DPM (Podiatry) Pleasant, Eppie Gibson, RN as [place] Management Chambliss, Jeb Levering, MD (Family Medicine) Lazaro Arms, RN as [place] [facility]  Date of Service:  12/30/2022  CHIEF COMPLAINT: f/u of right breast cancer   CURRENT THERAPY:  Surveillance   ASSESSMENT:  [name] is a 72 y.o. female with   Breast cancer of lower-outer quadrant of right female breast ([place]) pT1c N0 M0, stage IA, ER+/PR+/HER-2- -Diagnosed 01/2015, s/p right lumpectomy and SLNB and adjuvant radiation. Oncotype showed low risk. She completed antiestrogen therapy with exemestane from 05/2015 - 05/2020, tolerated well  -Continue breast cancer surveillance, we discussed small risk of later recurrence. -She is clinically doing well, exam was unremarkable, no clinical concern for recurrence   PLAN: - lab reviewed, she is clinically doing well  -Mammogram 4/24 F/u in 1 year  SUMMARY OF ONCOLOGIC HISTORY: Oncology History Overview Note    Breast cancer of lower-outer quadrant of right female breast   Staging form: Breast, AJCC 7th Edition     Clinical stage from 02/19/2015: Stage IA (T1b, N0, M0) - Unsigned       Staging comments: Staged at breast conference on 3.23.16      Pathologic stage from 03/24/2015: Stage IA (T1c, N0, cM0) - Signed by Enid Cutter, MD on 03/31/2015       Staging comments: Staged on final lumpectomy specimen by Dr. Donato Heinz.    Breast cancer of  lower-outer quadrant of right female breast ([place])  01/23/2015 Mammogram   Possible mass in right breast warrants further evaluation.  No findings in the left breast suspicious for malignancy   02/03/2015 Breast US   Right breast: 5 x 5 x 5 mm irregular hypoechoic mass with internal color vascularity at 6 o'clock position, 4 cm from the nipple   02/10/2015 Initial Biopsy   Right needle core bx LOQ: Invasive ductal carcinoma, grade 1, ER+ (100%), PR+ (60%), HER2/neu negative (ratio 1.13), Ki67 3%; DCIS.     02/12/2015 Breast MRI   Biopsy-proven malignancy in the central right breast at posterior depth. No MR findings to suggest multicentric or contralateral malignancy.   02/12/2015 Clinical Stage   Stage IA: T1b N0   03/21/2015 Definitive Surgery   Right breast lumpectomy / SLNB [name]): Invasive ductal carcinoma, grade 1, 1.1 cm, ER+ (100%), PR+ (73%), HER-2 negative (ratio 1.3), Ki67 6%, negative margins / no lymphovascular invasion; DCIS.  One lymph node negative for tumor (0/1).     03/21/2015 Oncotype testing   RS 7, low risk. (ROR 5% for 10-year recurrence with Tamoxifen alone).    03/21/2015 Pathologic Stage   Stage IA: pT1c pN0   05/07/2015 - 06/23/2015 Radiation Therapy   Adjuvant RT completed Pablo Ledger): Right breast 45 Gy over 25 fractions.   Right breast boost 16 Gy over 8 fractions. Total dose: 60 Gy.   06/02/2015 - 05/2020 Anti-estrogen oral therapy   Aromasin 25 mg once daily Burr Medico).  Planned duration of therapy: 5 years, completed in 05/2020.    08/15/2015 Survivorship   Survivorship visit completed and  copy of survivorship care plan provided to patient.   01/31/2018 Mammogram   IMPRESSION: No mammographic evidence for malignancy.      INTERVAL HISTORY:  [name] is here for a follow up of  right breast cancer She was last seen by me on 12/30/2021 She presents to the clinic alone. Pt is doing well over all. Beside her arthritis.       All other systems were reviewed  with the patient and are negative.  MEDICAL HISTORY:  Past Medical History:  Diagnosis Date   Anemia    Arthritis    Asthma    Blood transfusion without reported diagnosis    Patients believes 2015 when she overdosed on "medications"    Breast cancer ([place])    Breast cancer of lower-outer quadrant of right female breast ([place]) 02/13/2015   Cataract    CHF, acute ([place]) 05/03/2012   Depression    Diabetes mellitus    Eczema    Fatty liver    Fatty infiltration of liver noted on 03/2012 CT scan   Fibromyalgia    Glaucoma    Heart disease    History of kidney stones    w/ hx of hydronephrosis - followed by [facility]   HIV nonspecific serology    2006: indeterminate HIV blood test, seen by ID, felt secondary to cross reacting antibodies with no further workup felt necessary at that time    Hyperlipidemia    Hypertension    Obesity    Personal history of radiation therapy 2016   right   Radiation 05/07/15-06/23/15   Right Breast    SURGICAL HISTORY: Past Surgical History:  Procedure Laterality Date   BREAST BIOPSY Left 02/10/2015   malignant    BREAST LUMPECTOMY Right 03/21/2015   CHOLECYSTECTOMY  2003   CYSTOSCOPY W/ LITHOLAPAXY / EHL     JOINT REPLACEMENT     bilateral knee replacement   LEFT AND RIGHT HEART CATHETERIZATION WITH CORONARY ANGIOGRAM N/A 05/02/2012   Procedure: LEFT AND RIGHT HEART CATHETERIZATION WITH CORONARY ANGIOGRAM;  Surgeon: Burnell Blanks, MD;  Location: [facility] CATH LAB;  Service: Cardiovascular;  Laterality: N/A;   RADIOACTIVE SEED GUIDED PARTIAL MASTECTOMY WITH AXILLARY SENTINEL LYMPH NODE BIOPSY Right 03/21/2015   Procedure: RIGHT  PARTIAL MASTECTOMY WITH RADIOACTIVE SEED LOCALIZATION RIGHT  AXILLARY SENTINEL LYMPH NODE BIOPSY;  Surgeon: Fanny Skates, MD;  Location: [facility];  Service: General;  Laterality: Right;   REPLACEMENT TOTAL KNEE BILATERAL  2005 &2006   VASCULAR SURGERY Right 03/15/2013   Ultrasound guided  sclerotherapy    I have reviewed the social history and family history with the patient and they are unchanged from previous note.  ALLERGIES:  has No Known Allergies.  MEDICATIONS:  Current Outpatient Medications  Medication Sig Dispense Refill   acetaminophen (TYLENOL) 650 MG CR tablet Take 650 mg by mouth every 8 (eight) hours as needed for pain.     allopurinol (ZYLOPRIM) 100 MG tablet TAKE 1 TABLET BY MOUTH  DAILY 100 tablet 2   atorvastatin (LIPITOR) 40 MG tablet TAKE 1 TABLET BY MOUTH DAILY 100 tablet 2   BD AUTOSHIELD DUO 30G X 5 MM MISC USE ONCE DAILY TO INJECT  INSULIN AS DIRECTED 90 each 3   blood glucose meter kit and supplies Dispense based on patient and insurance preference.Use to check blood sugar in the morning. (FOR ICD-10 E10.9, E11.9).  Roma Schanz is what she has been using. 1 each 0   Blood Glucose  Monitoring Suppl ([place]) w/Device KIT USE AS DIRECTED 1 kit 0   Blood Glucose Monitoring Suppl (ONETOUCH VERIO) w/Device KIT Use to check blood glucose as directed 1 kit 0   CARESTART COVID-19 HOME TEST KIT See admin instructions.     colchicine 0.6 MG tablet TAKE 1 TABLET BY MOUTH DAILY 100 tablet 2   dorzolamide (TRUSOPT) 2 % ophthalmic solution 1 drop daily.     dorzolamide-timolol (COSOPT) 22.3-6.8 MG/ML ophthalmic solution Place 1 drop into both eyes 2 (two) times daily.  0   EASY COMFORT PEN NEEDLES 31G X 8 MM MISC USE TO INJECT INSULIN EVERY DAY AS DIRECTED 100 each 1   fluticasone (FLOVENT HFA) 110 MCG/ACT inhaler USE 2 INHALATIONS BY MOUTH TWICE DAILY 36 g 11   gabapentin (NEURONTIN) 100 MG capsule TAKE 1 CAPSULE BY MOUTH IN THE  MORNING AND 2 CAPSULES BY MOUTH  IN THE EVENING AS NEEDED 180 capsule 1   glucose blood (ONETOUCH VERIO) test strip Use as instructed 100 each 12   glucose blood (ONETOUCH VERIO) test strip CHECK BLOOD SUGAR IN THE  MORNING AS DIRECTED 100 strip 3   JARDIANCE 25 MG TABS tablet TAKE 1 TABLET BY MOUTH  DAILY 100  tablet 2   Lancet Devices (ONETOUCH DELICA PLUS LANCING) MISC USE ONCE DAILY 100 each 3   Lancets (ONETOUCH DELICA PLUS [id number]) MISC USE DAILY AS NEEDED. 100 each prn   LANTUS SOLOSTAR 100 UNIT/ML Solostar Pen INJECT 32 UNITS EACH MORNING 30 mL 6   latanoprost (XALATAN) 0.005 % ophthalmic solution Place 1 drop into both eyes at bedtime.   0   losartan (COZAAR) 100 MG tablet TAKE 1 TABLET BY MOUTH AT  BEDTIME 100 tablet 2   metoprolol succinate (TOPROL-XL) 25 MG 24 hr tablet Take 1/2 tab daily 100 tablet 2   Miconazole Nitrate (LOTRIMIN AF) 2 % AERO Spray between toes once daily for 4 weeks. 150 g 1   ONETOUCH VERIO test strip CHECK BLOOD SUGAR IN THE  MORNING AS DIRECTED 100 strip 2   OZEMPIC, 2 MG/DOSE, 8 MG/3ML SOPN INJECT SUBCUTANEOUSLY 2 MG ONCE  A WEEK 9 mL 3   spironolactone (ALDACTONE) 25 MG tablet TAKE 1 TABLET BY MOUTH  DAILY 100 tablet 2   No current facility-administered medications for this visit.    PHYSICAL EXAMINATION: ECOG PERFORMANCE STATUS: 1 - Symptomatic but completely ambulatory  Vitals:   12/30/22 0930  BP: (!) 162/89  Pulse: 78  Resp: 19  Temp: 98 F (36.7 C)  SpO2: 100%   Wt Readings from Last 3 Encounters:  12/30/22 216 lb 11.2 oz (98.3 kg)  12/07/22 216 lb 12.8 oz (98.3 kg)  09/06/22 217 lb 3.2 oz (98.5 kg)     GENERAL:alert, no distress and comfortable SKIN: skin color, texture, turgor are normal, no rashes or significant lesions EYES: normal, Conjunctiva are pink and non-injected, sclera clear NECK: supple, thyroid normal size, non-tender, without nodularity LYMPH: (-) no palpable lymphadenopathy in the cervical, axillary  LUNGS: clear to auscultation and percussion with normal breathing effort ABDOMEN:(-) abdomen soft, non-tender and normal bowel sounds Musculoskeletal:no cyanosis of digits and no clubbing  NEURO: alert & oriented x 3 with fluent speech, no focal motor/sensory deficits BREAST: RT  BREAST a little smaller with minimum scar  tissue. Lt breast has no palpable mass , breast exam benign.    LABORATORY DATA:  I have reviewed the data as listed    Latest Ref Rng & Units  12/30/2022    9:13 AM 12/01/2021   10:20 AM 12/30/2020    1:14 PM  CBC  WBC 4.0 - 10.5 K/uL 7.6  7.1  10.4   Hemoglobin 12.0 - 15.0 g/dL 13.1  12.1  12.3   Hematocrit 36.0 - 46.0 % 40.9  38.5  40.5   Platelets 150 - 400 K/uL 160  144  226         Latest Ref Rng & Units 12/30/2022    9:13 AM 12/07/2022    9:11 AM 12/30/2021   10:49 AM  CMP  Glucose 70 - 99 mg/dL 144  154  158   BUN 8 - 23 mg/dL '26  26  19   '$ Creatinine 0.44 - 1.00 mg/dL 1.20  1.32  1.16   Sodium 135 - 145 mmol/L 141  139  140   Potassium 3.5 - 5.1 mmol/L 4.5  4.7  4.2   Chloride 98 - 111 mmol/L 105  101  108   CO2 22 - 32 mmol/L '28  20  27   '$ Calcium 8.9 - 10.3 mg/dL 9.8  9.2  8.9   Total Protein 6.5 - 8.1 g/dL 7.6  6.8  6.8   Total Bilirubin 0.3 - 1.2 mg/dL 0.6  0.6  0.6   Alkaline Phos 38 - 126 U/L 104  100  103   AST 15 - 41 U/L '22  23  18   '$ ALT 0 - 44 U/L '18  20  14       '$ RADIOGRAPHIC STUDIES: I have personally reviewed the radiological images as listed and agreed with the findings in the report. No results found.    Orders Placed This Encounter  Procedures   MM Digital Screening    Standing Status:   Future    Standing Expiration Date:   12/30/2023    Order Specific Question:   Reason for Exam (SYMPTOM  OR DIAGNOSIS REQUIRED)    Answer:   screening    Order Specific Question:   Preferred imaging location?    Answer:   [facility]   All questions were answered. The patient knows to call the clinic with any problems, questions or concerns. No barriers to learning was detected. The total time spent in the appointment was 20 minutes.     Truitt Merle, MD 12/30/2022   Felicity Coyer, CMA, am acting as scribe for Truitt Merle, MD.   I have reviewed the above documentation for accuracy and completeness, and I agree with the above.

## 2022-12-29 NOTE — Assessment & Plan Note (Signed)
pT1c N0 M0, stage IA, ER+/PR+/HER-2- -Diagnosed 01/2015, s/p right lumpectomy and SLNB and adjuvant radiation. Oncotype showed low risk. She completed antiestrogen therapy with exemestane from 05/2015 - 05/2020, tolerated well  -Continue breast cancer surveillance, we discussed small risk of later recurrence.

## 2022-12-30 ENCOUNTER — Encounter: Payer: Self-pay | Admitting: Hematology

## 2022-12-30 ENCOUNTER — Inpatient Hospital Stay: Payer: 59 | Attending: Hematology

## 2022-12-30 ENCOUNTER — Other Ambulatory Visit: Payer: Self-pay

## 2022-12-30 ENCOUNTER — Inpatient Hospital Stay (HOSPITAL_BASED_OUTPATIENT_CLINIC_OR_DEPARTMENT_OTHER): Payer: 59 | Admitting: Hematology

## 2022-12-30 VITALS — BP 162/89 | HR 78 | Temp 98.0°F | Resp 19 | Ht 62.0 in | Wt 216.7 lb

## 2022-12-30 DIAGNOSIS — C50511 Malignant neoplasm of lower-outer quadrant of right female breast: Secondary | ICD-10-CM

## 2022-12-30 DIAGNOSIS — Z923 Personal history of irradiation: Secondary | ICD-10-CM | POA: Diagnosis not present

## 2022-12-30 DIAGNOSIS — N6324 Unspecified lump in the left breast, lower inner quadrant: Secondary | ICD-10-CM

## 2022-12-30 DIAGNOSIS — Z170 Estrogen receptor positive status [ER+]: Secondary | ICD-10-CM | POA: Diagnosis not present

## 2022-12-30 DIAGNOSIS — Z853 Personal history of malignant neoplasm of breast: Secondary | ICD-10-CM | POA: Insufficient documentation

## 2022-12-30 DIAGNOSIS — Z1231 Encounter for screening mammogram for malignant neoplasm of breast: Secondary | ICD-10-CM | POA: Diagnosis not present

## 2022-12-30 LAB — CMP (CANCER CENTER ONLY)
ALT: 18 U/L (ref 0–44)
AST: 22 U/L (ref 15–41)
Albumin: 4 g/dL (ref 3.5–5.0)
Alkaline Phosphatase: 104 U/L (ref 38–126)
Anion gap: 8 (ref 5–15)
BUN: 26 mg/dL — ABNORMAL HIGH (ref 8–23)
CO2: 28 mmol/L (ref 22–32)
Calcium: 9.8 mg/dL (ref 8.9–10.3)
Chloride: 105 mmol/L (ref 98–111)
Creatinine: 1.2 mg/dL — ABNORMAL HIGH (ref 0.44–1.00)
GFR, Estimated: 48 mL/min — ABNORMAL LOW (ref >60)
Glucose, Bld: 144 mg/dL — ABNORMAL HIGH (ref 70–99)
Potassium: 4.5 mmol/L (ref 3.5–5.1)
Sodium: 141 mmol/L (ref 135–145)
Total Bilirubin: 0.6 mg/dL (ref 0.3–1.2)
Total Protein: 7.6 g/dL (ref 6.5–8.1)

## 2022-12-30 LAB — CBC WITH DIFFERENTIAL (CANCER CENTER ONLY)
Abs Immature Granulocytes: 0.05 10*3/uL (ref 0.00–0.07)
Basophils Absolute: 0.1 10*3/uL (ref 0.0–0.1)
Basophils Relative: 1 %
Eosinophils Absolute: 0.2 10*3/uL (ref 0.0–0.5)
Eosinophils Relative: 3 %
HCT: 40.9 % (ref 36.0–46.0)
Hemoglobin: 13.1 g/dL (ref 12.0–15.0)
Immature Granulocytes: 1 %
Lymphocytes Relative: 38 %
Lymphs Abs: 2.8 10*3/uL (ref 0.7–4.0)
MCH: 31.3 pg (ref 26.0–34.0)
MCHC: 32 g/dL (ref 30.0–36.0)
MCV: 97.8 fL (ref 80.0–100.0)
Monocytes Absolute: 0.7 10*3/uL (ref 0.1–1.0)
Monocytes Relative: 9 %
Neutro Abs: 3.7 10*3/uL (ref 1.7–7.7)
Neutrophils Relative %: 48 %
Platelet Count: 160 10*3/uL (ref 150–400)
RBC: 4.18 MIL/uL (ref 3.87–5.11)
RDW: 13.2 % (ref 11.5–15.5)
WBC Count: 7.6 10*3/uL (ref 4.0–10.5)
nRBC: 0 % (ref 0.0–0.2)

## 2023-01-10 ENCOUNTER — Other Ambulatory Visit: Payer: Self-pay | Admitting: Family Medicine

## 2023-01-17 ENCOUNTER — Other Ambulatory Visit: Payer: Self-pay

## 2023-01-17 ENCOUNTER — Telehealth: Payer: Self-pay

## 2023-01-17 DIAGNOSIS — E1169 Type 2 diabetes mellitus with other specified complication: Secondary | ICD-10-CM

## 2023-01-17 MED ORDER — ONETOUCH VERIO W/DEVICE KIT: PACK | 0 refills | Status: DC

## 2023-01-17 NOTE — Telephone Encounter (Signed)
Patient calls nurse line requesting a supporting letter from PCP.   She reports she is currently having to walk down her driveway to the mailbox daily. She reports this is becoming more and more difficult for her.   She is requesting a supporting letter from PCP to have her mailbox moved to her front porch due to medical history.   Will forward to PCP.

## 2023-01-18 ENCOUNTER — Ambulatory Visit: Payer: Self-pay

## 2023-01-18 NOTE — Telephone Encounter (Signed)
I created a letter and sent to admin to mail to her home.

## 2023-01-18 NOTE — Patient Instructions (Signed)
Visit Information  Thank you for taking time to visit with me today. Please don't hesitate to contact me if I can be of assistance to you.   Following are the goals we discussed today:   Goals Addressed             This Visit's Progress    I want to monitor and mange my blood sugars       Patient Goals/Self Care Activities: -Patient/Caregiver will self-administer medications as prescribed as evidenced by self-report/primary caregiver report  -Patient/Caregiver will attend all scheduled provider appointments as evidenced by clinician review of documented attendance to scheduled appointments and patient/caregiver report -Patient/Caregiver will call pharmacy for medication refills as evidenced by patient report and review of pharmacy fill history as appropriate -Patient/Caregiver will call provider office for new concerns or questions as evidenced by review of documented incoming telephone call notes and patient report -Patient/Caregiver verbalizes understanding of plan -Patient/Caregiver will focus on medication adherence by taking medications as prescribed  -check blood sugar at prescribed times -check blood sugar if I feel it is too high or too low -record values and write them down take them to all doctor visits  - Monitor food and fluid intake.          Our next appointment is by telephone on 02/18/23 at 1130 am  Please call the care guide team at [phone] if you need to cancel or reschedule your appointment.   If you are experiencing a Mental Health or [place] or need someone to talk to, please call 1-800-273-TALK (toll free, 24 hour hotline)  Patient verbalizes understanding of instructions and care plan provided today and agrees to view in [place]. Active MyChart status and patient understanding of how to access instructions and care plan via MyChart confirmed with patient.     Lazaro Arms RN, BSN, [facility]    Phone: [phone]

## 2023-01-18 NOTE — Patient Outreach (Signed)
  Care Coordination   Follow Up Visit Note   [id number] Name: [name] MRN: [id number] DOB: [id number]  [name] is a 72 y.o. year old female who sees Chambliss, Jeb Levering, MD for primary care. I spoke with  [name] by phone today.  What matters to the patients health and wellness today?  [name], she has been doing well.  She has been having aches and pains due to the weather.  Her a1c in January was 7.8, and she will be seen back in March on her birthday today; it was 89.  She is eating and sleeping ok, with no harmful side effects from her medications    Goals Addressed             This Visit's Progress    I want to monitor and mange my blood sugars       Patient Goals/Self Care Activities: -Patient/Caregiver will self-administer medications as prescribed as evidenced by self-report/primary caregiver report  -Patient/Caregiver will attend all scheduled provider appointments as evidenced by clinician review of documented attendance to scheduled appointments and patient/caregiver report -Patient/Caregiver will call pharmacy for medication refills as evidenced by patient report and review of pharmacy fill history as appropriate -Patient/Caregiver will call provider office for new concerns or questions as evidenced by review of documented incoming telephone call notes and patient report -Patient/Caregiver verbalizes understanding of plan -Patient/Caregiver will focus on medication adherence by taking medications as prescribed  -check blood sugar at prescribed times -check blood sugar if I feel it is too high or too low -record values and write them down take them to all doctor visits  - Monitor food and fluid intake.          SDOH assessments and interventions completed:  No     Care Coordination Interventions:  Yes, provided   Interventions Today    Flowsheet Row Most Recent Value  Chronic Disease   Chronic disease during today's visit Diabetes   General Interventions   General Interventions Discussed/Reviewed General Interventions Discussed  Nutrition Interventions   Nutrition Discussed/Reviewed Nutrition Discussed        Follow up plan: Follow up call scheduled for 02/18/23 1130 am    Encounter Outcome:  Pt. Visit Completed   Lazaro Arms RN, BSN, [facility]   Phone: [phone]

## 2023-02-04 ENCOUNTER — Telehealth: Payer: Self-pay | Admitting: Family Medicine

## 2023-02-04 ENCOUNTER — Ambulatory Visit (INDEPENDENT_AMBULATORY_CARE_PROVIDER_SITE_OTHER): Payer: 59 | Admitting: Podiatry

## 2023-02-04 DIAGNOSIS — B351 Tinea unguium: Secondary | ICD-10-CM

## 2023-02-04 DIAGNOSIS — E1140 Type 2 diabetes mellitus with diabetic neuropathy, unspecified: Secondary | ICD-10-CM

## 2023-02-04 NOTE — Progress Notes (Signed)
This patient returns to my office for at risk foot care.  This patient requires this care by a professional since this patient will be at risk due to having diabetes.   This patient is unable to cut nails herself since the patient cannot reach her nails.These nails are painful walking and wearing shoes.  This patient presents for at risk foot care today.  General Appearance  Alert, conversant and in no acute stress.  Vascular  Dorsalis pedis and posterior tibial  pulses are palpable  bilaterally.  Capillary return is within normal limits  bilaterally. Temperature is within normal limits  bilaterally.  Neurologic  Senn-Weinstein monofilament wire test absent   bilaterally. Muscle power within normal limits bilaterally.  Nails Thick disfigured discolored nails with subungual debris  from hallux to fifth toes bilaterally. No evidence of bacterial infection or drainage bilaterally.  Orthopedic  No limitations of motion  feet .  No crepitus or effusions noted.  No bony pathology or digital deformities noted. Asymptomatic  HAV  B/L.  Pes planus  B/L. Hammer toes second left foot. Contracted digits  B/L.  Skin  normotropic skin with no porokeratosis noted bilaterally.  No signs of infections or ulcers noted.     Onychomycosis  Pain in right toes  Pain in left toes    Consent was obtained for treatment procedures.   Mechanical debridement of nails 1-5  bilaterally performed with a nail nipper.  Filed with dremel without incident.    Return office visit  3 months                    Told patient to return for periodic foot care and evaluation due to potential at risk complications   Gardiner Barefoot DPM

## 2023-02-04 NOTE — Telephone Encounter (Signed)
Called patient to schedule Medicare Annual Wellness Visit (AWV). Left message for patient to call back and schedule Medicare Annual Wellness Visit (AWV).  Last date of AWV: 04/19/2019   Please schedule an appointment at any time with [place], [place].  If any questions, please contact me at [phone].   Thank you,  [place] Direct [facility]  [phone]

## 2023-02-08 ENCOUNTER — Ambulatory Visit (INDEPENDENT_AMBULATORY_CARE_PROVIDER_SITE_OTHER): Payer: 59 | Admitting: Family Medicine

## 2023-02-08 ENCOUNTER — Other Ambulatory Visit: Payer: Self-pay | Admitting: *Deleted

## 2023-02-08 ENCOUNTER — Encounter: Payer: Self-pay | Admitting: Family Medicine

## 2023-02-08 VITALS — BP 137/75 | HR 74 | Ht 62.0 in | Wt 216.2 lb

## 2023-02-08 DIAGNOSIS — R2681 Unsteadiness on feet: Secondary | ICD-10-CM

## 2023-02-08 DIAGNOSIS — M79641 Pain in right hand: Secondary | ICD-10-CM | POA: Diagnosis not present

## 2023-02-08 DIAGNOSIS — E1169 Type 2 diabetes mellitus with other specified complication: Secondary | ICD-10-CM

## 2023-02-08 MED ORDER — EMPAGLIFLOZIN 25 MG PO TABS: 25.0000 mg | ORAL_TABLET | Freq: Every day | ORAL | 2 refills | Status: DC

## 2023-02-08 NOTE — Progress Notes (Signed)
    SUBJECTIVE:   CHIEF COMPLAINT / HPI:   R Hand Pain This continues waxing and waning.  Tylenol helps some.  Cold makes it worse.  No redness or intermittent swelling or fever  Gait Continues to use cane due to pain in her knees and some balance problems.  No recent falls.  Has been to Physical Therapy in past.  Taking gabapetin only once  a day   Brings in all her medications   OBJECTIVE:   BP 137/75   Pulse 74   Ht 5\' 2"  (1.575 m)   Wt 216 lb 3.2 oz (98.1 kg)   SpO2 99%   BMI 39.54 kg/m   Good spirits it is her birthday today Moves slowly using a cane R hand - mild diffuse thickening of all joints.  Not warm nearly FROM with grip with mild pain   ASSESSMENT/PLAN:   Gait instability Assessment & Plan: Stable.  Multifactorial due to knee replacements, ongoing arthritis.  She is managing well with a cane but walking outside could be hazardous.  Printed letter for her to have her mailbox on her house.        Patient Instructions  Good to see you today - Thank you for coming in  Things we discussed today:  Take the gabapentin at night only  Try heat on your hand  Let me know if you need more needles  HAPPY BIRTHDAY!!!  Please always bring your medication bottles  Come back to see me in [place], [place]

## 2023-02-08 NOTE — Assessment & Plan Note (Signed)
Stable.  Multifactorial due to knee replacements, ongoing arthritis.  She is managing well with a cane but walking outside could be hazardous.  Printed letter for her to have her mailbox on her house.

## 2023-02-08 NOTE — Assessment & Plan Note (Signed)
Consistent with DJD.  Do not feel is gout given chronicity and lack of inflammation.  Continue tylenol, mild exercise and keep warm.

## 2023-02-08 NOTE — Patient Instructions (Signed)
Good to see you today - Thank you for coming in  Things we discussed today:  Take the gabapentin at night only  Try heat on your hand  Let me know if you need more needles  HAPPY BIRTHDAY!!!  Please always bring your medication bottles  Come back to see me in May-June

## 2023-02-11 ENCOUNTER — Other Ambulatory Visit: Payer: Self-pay

## 2023-02-14 MED ORDER — FLUTICASONE PROPIONATE HFA 110 MCG/ACT IN AERO: INHALATION_SPRAY | RESPIRATORY_TRACT | 11 refills | Status: DC

## 2023-02-16 ENCOUNTER — Other Ambulatory Visit: Payer: Self-pay | Admitting: Hematology

## 2023-02-16 DIAGNOSIS — C50511 Malignant neoplasm of lower-outer quadrant of right female breast: Secondary | ICD-10-CM

## 2023-02-18 ENCOUNTER — Ambulatory Visit: Payer: Self-pay

## 2023-02-18 NOTE — Patient Instructions (Signed)
Visit Information  Thank you for taking time to visit with me today. Please don't hesitate to contact me if I can be of assistance to you.   Following are the goals we discussed today:   Goals Addressed             This Visit's Progress    I want to monitor and mange my blood sugars       Patient Goals/Self Care Activities: -Patient/Caregiver will self-administer medications as prescribed as evidenced by self-report/primary caregiver report  -Patient/Caregiver will attend all scheduled provider appointments as evidenced by clinician review of documented attendance to scheduled appointments and patient/caregiver report -Patient/Caregiver will call pharmacy for medication refills as evidenced by patient report and review of pharmacy fill history as appropriate -Patient/Caregiver will call provider office for new concerns or questions as evidenced by review of documented incoming telephone call notes and patient report -Patient/Caregiver verbalizes understanding of plan -Patient/Caregiver will focus on medication adherence by taking medications as prescribed  -check blood sugar at prescribed times --record values and write them down take them to all doctor visits  - Monitor food and fluid intake.          Our next appointment is by telephone on 03/23/23 at 930 am  Please call the care guide team at [phone] if you need to cancel or reschedule your appointment.   If you are experiencing a Mental Health or [place] or need someone to talk to, please call 1-800-273-TALK (toll free, 24 hour hotline)  Patient verbalizes understanding of instructions and care plan provided today and agrees to view in [place]. Active MyChart status and patient understanding of how to access instructions and care plan via MyChart confirmed with patient.      Lazaro Arms RN, BSN, [facility]   Phone: [phone]

## 2023-02-18 NOTE — Patient Outreach (Signed)
  Care Coordination   Follow Up Visit Note   0000000 Name: [name] MRN: [id number] DOB: [id number]  [name] is a 72 y.o. year old female who sees Chambliss, Jeb Levering, MD for primary care. I spoke with  [name] by phone today.  What matters to the patients health and wellness today?  [name] was observed at the office on her birthday on March 12th, 2026. Her blood pressure was measured at 137/75, and her blood sugar level today was 155. She had her diabetic eye examination in January this year, and she is scheduled to go back in May or June to follow up for her A1c check-up. She will continue to take her medications mon, monitor her food and fluid intake, and keep track of her vitals. I encourage her to keep doing a great job of monitoring her health.    Goals Addressed             This Visit's Progress    I want to monitor and mange my blood sugars       Patient Goals/Self Care Activities: -Patient/Caregiver will self-administer medications as prescribed as evidenced by self-report/primary caregiver report  -Patient/Caregiver will attend all scheduled provider appointments as evidenced by clinician review of documented attendance to scheduled appointments and patient/caregiver report -Patient/Caregiver will call pharmacy for medication refills as evidenced by patient report and review of pharmacy fill history as appropriate -Patient/Caregiver will call provider office for new concerns or questions as evidenced by review of documented incoming telephone call notes and patient report -Patient/Caregiver verbalizes understanding of plan -Patient/Caregiver will focus on medication adherence by taking medications as prescribed  -check blood sugar at prescribed times --record values and write them down take them to all doctor visits  - Monitor food and fluid intake.          SDOH assessments and interventions completed:  No     Care Coordination  Interventions:  Yes, provided   Interventions Today    Flowsheet Row Most Recent Value  Chronic Disease   Chronic disease during today's visit Diabetes  General Interventions   General Interventions Discussed/Reviewed General Interventions Discussed, General Interventions Reviewed, Labs, Annual Foot Exam, Doctor Visits, Annual Eye Exam  Labs Hgb A1c every 3 months  Doctor Visits Discussed/Reviewed Doctor Visits Reviewed  Safety Interventions   Safety Discussed/Reviewed Safety Discussed        Follow up plan: Follow up call scheduled for 03/23/23  930 am    Encounter Outcome:  Pt. Visit Completed   Lazaro Arms RN, BSN, [facility]   Phone: [phone]

## 2023-02-21 ENCOUNTER — Other Ambulatory Visit (HOSPITAL_COMMUNITY): Payer: Self-pay

## 2023-03-05 ENCOUNTER — Other Ambulatory Visit: Payer: Self-pay | Admitting: Family Medicine

## 2023-03-11 ENCOUNTER — Ambulatory Visit
Admission: RE | Admit: 2023-03-11 | Discharge: 2023-03-11 | Disposition: A | Discharge status: Home / Self Care | Payer: 59 | Source: Ambulatory Visit | Attending: Hematology | Admitting: Hematology

## 2023-03-11 DIAGNOSIS — Z1231 Encounter for screening mammogram for malignant neoplasm of breast: Secondary | ICD-10-CM | POA: Diagnosis not present

## 2023-03-21 ENCOUNTER — Other Ambulatory Visit: Payer: Self-pay | Admitting: Family Medicine

## 2023-03-21 DIAGNOSIS — E785 Hyperlipidemia, unspecified: Secondary | ICD-10-CM

## 2023-03-22 ENCOUNTER — Other Ambulatory Visit: Payer: Self-pay

## 2023-03-22 DIAGNOSIS — E1169 Type 2 diabetes mellitus with other specified complication: Secondary | ICD-10-CM

## 2023-03-22 MED ORDER — LANTUS SOLOSTAR 100 UNIT/ML ~~LOC~~ SOPN: PEN_INJECTOR | SUBCUTANEOUS | 6 refills | Status: DC

## 2023-03-23 ENCOUNTER — Ambulatory Visit: Payer: Self-pay

## 2023-03-23 NOTE — Patient Outreach (Signed)
  Care Coordination   Follow Up Visit Note   03/23/2023 Name: [name] MRN: [id number] DOB: June 29, 1951  [name] is a 72 y.o. year old female who sees Chambliss, Estill Batten, MD for primary care. I spoke with  [name] by phone today.  What matters to the patients health and wellness today?  [name] has been suffering from arthritic pain throughout her body. She experiences both good and bad days. Her eating habits are healthy, and she is sleeping well. However, she still wakes up early, as she used to when she worked. She has been keeping track of her blood sugar levels daily, which have ranged from 123 to 190. I advised her to continue with her current regimen. She had a mammogram in April and has an appointment with Dr. Deirdre Priest in May, podiatry in June, and ophthalmology in July.    Goals Addressed             This Visit's Progress    I want to monitor and mange my blood sugars       Patient Goals/Self Care Activities: -Patient/Caregiver will self-administer medications as prescribed as evidenced by self-report/primary caregiver report  -Patient/Caregiver will attend all scheduled provider appointments as evidenced by clinician review of documented attendance to scheduled appointments and patient/caregiver report -Patient/Caregiver will call pharmacy for medication refills as evidenced by patient report and review of pharmacy fill history as appropriate -Patient/Caregiver will call provider office for new concerns or questions as evidenced by review of documented incoming telephone call notes and patient report -Patient/Caregiver verbalizes understanding of plan -Patient/Caregiver will focus on medication adherence by taking medications as prescribed  -check blood sugar at prescribed times - Monitor food and fluid intake.  -Continue to write down your values        SDOH assessments and interventions completed:  No     Care Coordination Interventions:   Yes, provided   Interventions Today    Flowsheet Row Most Recent Value  Chronic Disease   Chronic disease during today's visit Diabetes  General Interventions   General Interventions Discussed/Reviewed Doctor Visits  Doctor Visits Discussed/Reviewed Doctor Visits Discussed  Nutrition Interventions   Nutrition Discussed/Reviewed Nutrition Discussed        Follow up plan: Follow up call scheduled for 530/24  11 am    Encounter Outcome:  Pt. Visit Completed   Juanell Fairly RN, BSN, [facility] Care Coordinator [facility]   Phone: [phone]

## 2023-03-23 NOTE — Patient Instructions (Signed)
Visit Information  Thank you for taking time to visit with me today. Please don't hesitate to contact me if I can be of assistance to you.   Following are the goals we discussed today:   Goals Addressed             This Visit's Progress    I want to monitor and mange my blood sugars       Patient Goals/Self Care Activities: -Patient/Caregiver will self-administer medications as prescribed as evidenced by self-report/primary caregiver report  -Patient/Caregiver will attend all scheduled provider appointments as evidenced by clinician review of documented attendance to scheduled appointments and patient/caregiver report -Patient/Caregiver will call pharmacy for medication refills as evidenced by patient report and review of pharmacy fill history as appropriate -Patient/Caregiver will call provider office for new concerns or questions as evidenced by review of documented incoming telephone call notes and patient report -Patient/Caregiver verbalizes understanding of plan -Patient/Caregiver will focus on medication adherence by taking medications as prescribed  -check blood sugar at prescribed times - Monitor food and fluid intake.  -Continue to write down your values        Our next appointment is by telephone on 04/28/23 at 11 am  Please call the care guide team at [phone] if you need to cancel or reschedule your appointment.   If you are experiencing a Mental Health or Behavioral Health Crisis or need someone to talk to, please call 1-800-273-TALK (toll free, 24 hour hotline)  Patient verbalizes understanding of instructions and care plan provided today and agrees to view in MyChart. Active MyChart status and patient understanding of how to access instructions and care plan via MyChart confirmed with patient.     Kirsten Fairly RN, BSN, [facility] Care Coordinator [facility]   Phone: [phone]

## 2023-04-12 ENCOUNTER — Other Ambulatory Visit: Payer: Self-pay

## 2023-04-12 DIAGNOSIS — J4520 Mild intermittent asthma, uncomplicated: Secondary | ICD-10-CM

## 2023-04-12 DIAGNOSIS — E1169 Type 2 diabetes mellitus with other specified complication: Secondary | ICD-10-CM

## 2023-04-12 MED ORDER — LANTUS SOLOSTAR 100 UNIT/ML ~~LOC~~ SOPN: PEN_INJECTOR | SUBCUTANEOUS | 6 refills | Status: DC

## 2023-04-12 MED ORDER — FLUTICASONE PROPIONATE HFA 110 MCG/ACT IN AERO: INHALATION_SPRAY | RESPIRATORY_TRACT | 11 refills | Status: DC

## 2023-04-14 ENCOUNTER — Other Ambulatory Visit: Payer: Self-pay

## 2023-04-14 NOTE — Telephone Encounter (Signed)
Hello, [facility] faxed over a request to approve an alternative medicine for Flovent HFA because it is not covered by patient's insurance. Arnuity ELPT INH is covered under her insurance. Penni Bombard CMA

## 2023-04-21 ENCOUNTER — Telehealth: Payer: Self-pay

## 2023-04-21 MED ORDER — FLUTICASONE FUROATE-VILANTEROL 200-25 MCG/ACT IN AEPB: 1.0000 | INHALATION_SPRAY | Freq: Every day | RESPIRATORY_TRACT | 3 refills | Status: DC

## 2023-04-21 NOTE — Telephone Encounter (Signed)
It is replacing Flovent HFA AER . Penni Bombard CMA

## 2023-04-21 NOTE — Addendum Note (Signed)
Addended by: Manson Passey, Aidynn Krenn on: 04/21/2023 01:35 PM   Modules accepted: Orders

## 2023-04-21 NOTE — Telephone Encounter (Signed)
Sent in [place] given asthma guidelines.  Kirsten Starr, MD  Family Medicine Teaching Service

## 2023-04-21 NOTE — Telephone Encounter (Signed)
[facility] requested an alternative medication called Arnuity Elpt INH because it is covered under insurance. Please send the new medication to [facility] when you have the chance. Thank you. Penni Bombard CMA'

## 2023-04-26 ENCOUNTER — Other Ambulatory Visit: Payer: Self-pay

## 2023-04-26 ENCOUNTER — Ambulatory Visit (INDEPENDENT_AMBULATORY_CARE_PROVIDER_SITE_OTHER): Payer: 59 | Admitting: Family Medicine

## 2023-04-26 ENCOUNTER — Encounter: Payer: Self-pay | Admitting: Family Medicine

## 2023-04-26 VITALS — BP 129/87 | HR 83 | Ht 62.0 in | Wt 216.0 lb

## 2023-04-26 DIAGNOSIS — G8929 Other chronic pain: Secondary | ICD-10-CM | POA: Diagnosis not present

## 2023-04-26 DIAGNOSIS — M25512 Pain in left shoulder: Secondary | ICD-10-CM

## 2023-04-26 DIAGNOSIS — M21969 Unspecified acquired deformity of unspecified lower leg: Secondary | ICD-10-CM | POA: Diagnosis not present

## 2023-04-26 DIAGNOSIS — R2681 Unsteadiness on feet: Secondary | ICD-10-CM | POA: Diagnosis not present

## 2023-04-26 DIAGNOSIS — I10 Essential (primary) hypertension: Secondary | ICD-10-CM | POA: Diagnosis not present

## 2023-04-26 DIAGNOSIS — M159 Polyosteoarthritis, unspecified: Secondary | ICD-10-CM | POA: Diagnosis not present

## 2023-04-26 DIAGNOSIS — E1169 Type 2 diabetes mellitus with other specified complication: Secondary | ICD-10-CM

## 2023-04-26 LAB — POCT GLYCOSYLATED HEMOGLOBIN (HGB A1C): HbA1c, POC (controlled diabetic range): 7.3 % — AB (ref 0.0–7.0)

## 2023-04-26 NOTE — Assessment & Plan Note (Signed)
Stable.  Using a cane most of the time.  No recent falls

## 2023-04-26 NOTE — Assessment & Plan Note (Signed)
Good control. Continue current regimen. 

## 2023-04-26 NOTE — Patient Instructions (Signed)
Good to see you today - Thank you for coming in  Things we discussed today:  L Shoulder  I have put in a referral to Physical Therapy.  They should call you to schedule an appointment.  This could appear as an unknown number on your phone.  If you have not heard from them in 2 weeks please let me know.   Diabetes You are doing well  You can check your blood sugar every other morning unless you feel it is low  BP Keep checking  Please always bring your medication bottles  Come back to see me in 3 months

## 2023-04-26 NOTE — Assessment & Plan Note (Signed)
Consistent with DJD.  Her cancer is in remission.  Will refer to Physical Therapy.  If not improving as expected will need xrays

## 2023-04-26 NOTE — Assessment & Plan Note (Signed)
BP Readings from Last 3 Encounters:  04/26/23 129/87  02/08/23 137/75  12/30/22 (!) 162/89   At goal continue current medications

## 2023-04-26 NOTE — Progress Notes (Signed)
    SUBJECTIVE:   CHIEF COMPLAINT / HPI:   Diabetes No low blood sugars.  Knows her medications and brings in the bottles  Hypertension Took all her medications around 630 AM today     L Shoulder pain This continues with decreased range of motion.  No loss of hand strength or sensation. She agrees to see Physical Therapy.   OBJECTIVE:   BP 129/87   Pulse 83   Ht 5\' 2"  (1.575 m)   Wt 216 lb (98 kg)   SpO2 100%   BMI 39.51 kg/m   Heart - Regular rate and rhythm.  No murmurs, gallops or rubs.    Lungs:  Normal respiratory effort, chest expands symmetrically. Lungs are clear to auscultation, no crackles or wheezes. L Shoulder - marked decreased abduction with pain.  Distal grip normal   ASSESSMENT/PLAN:   Type 2 diabetes mellitus with diabetic foot deformity (HCC) Assessment & Plan: Good control.  Continue current regimen   Orders: -     POCT glycosylated hemoglobin (Hb A1C) -     Microalbumin / creatinine urine ratio  Primary osteoarthritis involving multiple joints -     Ambulatory referral to Physical Therapy  Essential hypertension Assessment & Plan: BP Readings from Last 3 Encounters:  04/26/23 129/87  02/08/23 137/75  12/30/22 (!) 162/89   At goal continue current medications    Chronic left shoulder pain Assessment & Plan: Consistent with DJD.  Her cancer is in remission.  Will refer to Physical Therapy.  If not improving as expected will need xrays    Gait instability Assessment & Plan: Stable.  Using a cane most of the time.  No recent falls       Patient Instructions  Good to see you today - Thank you for coming in  Things we discussed today:  L Shoulder  I have put in a referral to Physical Therapy.  They should call you to schedule an appointment.  This could appear as an unknown number on your phone.  If you have not heard from them in 2 weeks please let me know.   Diabetes You are doing well  You can check your blood sugar every  other morning unless you feel it is low  BP Keep checking  Please always bring your medication bottles  Come back to see me in 3 months    Carney Living, MD [facility]

## 2023-04-27 LAB — MICROALBUMIN / CREATININE URINE RATIO
Creatinine, Urine: 65.2 mg/dL
Microalb/Creat Ratio: 187 mg/g creat — ABNORMAL HIGH (ref 0–29)
Microalbumin, Urine: 121.9 ug/mL

## 2023-04-28 ENCOUNTER — Ambulatory Visit: Payer: Self-pay

## 2023-04-28 ENCOUNTER — Encounter: Payer: Self-pay | Admitting: Family Medicine

## 2023-04-28 DIAGNOSIS — E1121 Type 2 diabetes mellitus with diabetic nephropathy: Secondary | ICD-10-CM | POA: Insufficient documentation

## 2023-04-29 NOTE — Patient Instructions (Signed)
Visit Information  Thank you for taking time to visit with me today. Please don't hesitate to contact me if I can be of assistance to you.   Following are the goals we discussed today:   Goals Addressed             This Visit's Progress    I want to monitor and mange my blood sugars       Patient Goals/Self Care Activities: -Patient/Caregiver will self-administer medications as prescribed as evidenced by self-report/primary caregiver report  -Patient/Caregiver will attend all scheduled provider appointments as evidenced by clinician review of documented attendance to scheduled appointments and patient/caregiver report -Patient/Caregiver will call pharmacy for medication refills as evidenced by patient report and review of pharmacy fill history as appropriate -Patient/Caregiver will call provider office for new concerns or questions as evidenced by review of documented incoming telephone call notes and patient report -Patient/Caregiver verbalizes understanding of plan -Patient/Caregiver will focus on medication adherence by taking medications as prescribed  -check blood sugar at prescribed times - Monitor food and fluid intake.  -Continue your diligence with your health I am so happy with your accomplishment.        Our next appointment is by telephone on 07/27/23 at 130 pm  Please call the care guide team at [phone] if you need to cancel or reschedule your appointment.   If you are experiencing a Mental Health or Behavioral Health Crisis or need someone to talk to, please call 1-800-273-TALK (toll free, 24 hour hotline)  Patient verbalizes understanding of instructions and care plan provided today and agrees to view in MyChart. Active MyChart status and patient understanding of how to access instructions and care plan via MyChart confirmed with patient.     Juanell Fairly RN, BSN, [facility] Care Coordinator [facility]   Phone: [phone]

## 2023-04-29 NOTE — Patient Outreach (Signed)
  Care Coordination   Follow Up Visit Note   04/29/2023 Name: [name] MRN: [id number] DOB: [birth year]  [name] is a 72 y.o. year old female who sees Chambliss, Estill Batten, MD for primary care. I spoke with  [name] by phone today.  What matters to the patients health and wellness today?  [name]'s health is still improving. During her visit on Tuesday, her A1c was 7.3, which is a positive result. Her weight and blood pressure were also within the healthy range. She is diligently following her health regimen and has upcoming appointments for her eyes and podiatry in July.      Goals Addressed             This Visit's Progress    I want to monitor and mange my blood sugars       Patient Goals/Self Care Activities: -Patient/Caregiver will self-administer medications as prescribed as evidenced by self-report/primary caregiver report  -Patient/Caregiver will attend all scheduled provider appointments as evidenced by clinician review of documented attendance to scheduled appointments and patient/caregiver report -Patient/Caregiver will call pharmacy for medication refills as evidenced by patient report and review of pharmacy fill history as appropriate -Patient/Caregiver will call provider office for new concerns or questions as evidenced by review of documented incoming telephone call notes and patient report -Patient/Caregiver verbalizes understanding of plan -Patient/Caregiver will focus on medication adherence by taking medications as prescribed  -check blood sugar at prescribed times - Monitor food and fluid intake.  -Continue your diligence with your health I am so happy with your accomplishment.        SDOH assessments and interventions completed:  No     Care Coordination Interventions:  Yes, provided   Interventions Today    Flowsheet Row Most Recent Value  Chronic Disease   Chronic disease during today's visit Diabetes  General Interventions    General Interventions Discussed/Reviewed General Interventions Discussed, Doctor Visits  Exercise Interventions   Exercise Discussed/Reviewed Exercise Discussed  Nutrition Interventions   Nutrition Discussed/Reviewed Nutrition Discussed  Pharmacy Interventions   Pharmacy Dicussed/Reviewed Pharmacy Topics Discussed      .  Follow up plan: Follow up call scheduled for 07/27/23  130 pm    Encounter Outcome:  Pt. Visit Completed   Juanell Fairly RN, BSN, [facility] Care Coordinator [facility]   Phone: [phone]

## 2023-05-09 ENCOUNTER — Ambulatory Visit (INDEPENDENT_AMBULATORY_CARE_PROVIDER_SITE_OTHER): Payer: 59 | Admitting: Podiatry

## 2023-05-09 ENCOUNTER — Encounter: Payer: Self-pay | Admitting: Podiatry

## 2023-05-09 DIAGNOSIS — B351 Tinea unguium: Secondary | ICD-10-CM

## 2023-05-09 DIAGNOSIS — E1340 Other specified diabetes mellitus with diabetic neuropathy, unspecified: Secondary | ICD-10-CM

## 2023-05-09 DIAGNOSIS — M2010 Hallux valgus (acquired), unspecified foot: Secondary | ICD-10-CM

## 2023-05-09 DIAGNOSIS — E1140 Type 2 diabetes mellitus with diabetic neuropathy, unspecified: Secondary | ICD-10-CM

## 2023-05-09 NOTE — Progress Notes (Signed)
This patient returns to my office for at risk foot care.  This patient requires this care by a professional since this patient will be at risk due to having diabetes.   This patient is unable to cut nails herself since the patient cannot reach her nails.These nails are painful walking and wearing shoes.  This patient presents for at risk foot care today.  General Appearance  Alert, conversant and in no acute stress.  Vascular  Dorsalis pedis and posterior tibial  pulses are palpable  bilaterally.  Capillary return is within normal limits  bilaterally. Temperature is within normal limits  bilaterally.  Neurologic  Senn-Weinstein monofilament wire test absent   bilaterally. Muscle power within normal limits bilaterally.  Nails Thick disfigured discolored nails with subungual debris  from hallux to fifth toes bilaterally. No evidence of bacterial infection or drainage bilaterally.  Orthopedic  No limitations of motion  feet .  No crepitus or effusions noted.  No bony pathology or digital deformities noted. Asymptomatic  HAV  B/L.  Pes planus  B/L. Hammer toes second left foot. Contracted digits  B/L.  Skin  normotropic skin with no porokeratosis noted bilaterally.  No signs of infections or ulcers noted.     Onychomycosis  Pain in right toes  Pain in left toes    Consent was obtained for treatment procedures.   Mechanical debridement of nails 1-5  bilaterally performed with a nail nipper.  Filed with dremel without incident. Patient casted for diabetic shoes.   Return office visit  3 months                    Told patient to return for periodic foot care and evaluation due to potential at risk complications   Helane Gunther DPM

## 2023-05-14 ENCOUNTER — Other Ambulatory Visit: Payer: Self-pay | Admitting: Family Medicine

## 2023-05-17 NOTE — Therapy (Signed)
OUTPATIENT PHYSICAL THERAPY SHOULDER EVALUATION   Patient Name: [name] MRN: [id number] DOB:07-Jul-1951, 72 y.o., female Today's Date: 05/18/2023  END OF SESSION:  PT End of Session - 05/18/23 1311     Visit Number 1    Number of Visits 12    Date for PT Re-Evaluation 07/13/23    Authorization Type U[employer]    PT Start Time 1045    PT Stop Time 1130    PT Time Calculation (min) 45 min    Activity Tolerance Patient tolerated treatment well    Behavior During Therapy WFL for tasks assessed/performed             Past Medical History:  Diagnosis Date   Anemia    Arthritis    Asthma    Blood transfusion without reported diagnosis    Patients believes 2015 when she overdosed on "medications"    Breast cancer (HCC)    Breast cancer of lower-outer quadrant of right female breast (HCC) 02/13/2015   Cataract    CHF, acute (HCC) 05/03/2012   Depression    Diabetes mellitus    Eczema    Fatty liver    Fatty infiltration of liver noted on 03/2012 CT scan   Fibromyalgia    Glaucoma    Heart disease    History of kidney stones    w/ hx of hydronephrosis - followed by [facility]   HIV nonspecific serology    2006: indeterminate HIV blood test, seen by ID, felt secondary to cross reacting antibodies with no further workup felt necessary at that time    Hyperlipidemia    Hypertension    Obesity    Personal history of radiation therapy 2016   right   Radiation 05/07/15-06/23/15   Right Breast   Past Surgical History:  Procedure Laterality Date   BREAST BIOPSY Left 02/10/2015   malignant    BREAST LUMPECTOMY Right 03/21/2015   CHOLECYSTECTOMY  2003   CYSTOSCOPY W/ LITHOLAPAXY / EHL     JOINT REPLACEMENT     bilateral knee replacement   LEFT AND RIGHT HEART CATHETERIZATION WITH CORONARY ANGIOGRAM N/A 05/02/2012   Procedure: LEFT AND RIGHT HEART CATHETERIZATION WITH CORONARY ANGIOGRAM;  Surgeon: Kathleene Hazel, MD;  Location: [facility] CATH LAB;  Service:  Cardiovascular;  Laterality: N/A;   RADIOACTIVE SEED GUIDED PARTIAL MASTECTOMY WITH AXILLARY SENTINEL LYMPH NODE BIOPSY Right 03/21/2015   Procedure: RIGHT  PARTIAL MASTECTOMY WITH RADIOACTIVE SEED LOCALIZATION RIGHT  AXILLARY SENTINEL LYMPH NODE BIOPSY;  Surgeon: Claud Kelp, MD;  Location: [facility];  Service: General;  Laterality: Right;   REPLACEMENT TOTAL KNEE BILATERAL  2005 &2006   VASCULAR SURGERY Right 03/15/2013   Ultrasound guided sclerotherapy   Patient Active Problem List   Diagnosis Date Noted   Renal disease due to diabetes mellitus (HCC) 04/28/2023   Hand pain, right 12/07/2022   Left shoulder pain 09/06/2022   Irregular heart rate 08/10/2022   Left breast mass 11/11/2021   Tinea pedis 04/09/2020   Spinal stenosis of lumbar region without neurogenic claudication 02/08/2019   Gout 05/16/2018   Gait instability 01/03/2018   Tremor of both hands    Stuttering    Essential hypertension    Hyperlipidemia 08/13/2015   Breast cancer of lower-outer quadrant of right female breast (HCC) 02/13/2015   Type 2 diabetes mellitus with diabetic foot deformity (HCC) 01/01/2014   Chronic combined systolic and diastolic heart failure (HCC) 02/26/2013   OBESITY 02/05/2009   Asthma, intermittent 02/08/2008  Neuropathy due to secondary diabetes (HCC) 05/18/2007   NEPHROLITHIASIS 01/26/2007   DJD (degenerative joint disease), multiple sites 01/26/2007    PCP: Carney Living, MD  REFERRING PROVIDER: Carney Living, MD  REFERRING DIAG: M15.9 (ICD-10-CM) - Primary osteoarthritis involving multiple joints  THERAPY DIAG:  Adhesive bursitis of left shoulder  Chronic left shoulder pain  Muscle weakness (generalized)  Rationale for Evaluation and Treatment: Rehabilitation  ONSET DATE: chronic  SUBJECTIVE:                                                                                                                                                                                       SUBJECTIVE STATEMENT: Relates a chronic history of L shoulder pain and dysfunction  Hand dominance: Right  PERTINENT HISTORY: Chronic left shoulder pain Assessment & Plan: Consistent with DJD.  Her cancer is in remission.  Will refer to Physical Therapy.  If not improving as expected will need xrays   PAIN:  Are you having pain? Yes: NPRS scale: 8/10 Pain location: L shoulder Pain description: ache Aggravating factors: activity and weight bearing Relieving factors: undetermined  PRECAUTIONS: None  WEIGHT BEARING RESTRICTIONS: No  FALLS:  Has patient fallen in last 6 months? No  OCCUPATION: retired  PLOF: Independent  PATIENT GOALS: To reduce and manage my shoulder pain  NEXT MD VISIT:   OBJECTIVE:   DIAGNOSTIC FINDINGS:  none  PATIENT SURVEYS:  FOTO 44  POSTURE: Depressed L shoulder, rounded shoulders  UPPER EXTREMITY ROM:   A/PROM Right eval Left eval  Shoulder flexion 150d 80/80d  Shoulder extension    Shoulder abduction 150d 60/60d  Shoulder adduction    Shoulder internal rotation  /60d  Shoulder external rotation  /0d  Elbow flexion    Elbow extension    Wrist flexion    Wrist extension    Wrist ulnar deviation    Wrist radial deviation    Wrist pronation    Wrist supination    (Blank rows = not tested)  UPPER EXTREMITY MMT:  MMT Right eval Left eval  Shoulder flexion 4 4-  Shoulder extension 4 4-  Shoulder abduction 4 4-  Shoulder adduction    Shoulder internal rotation 4 4-  Shoulder external rotation 4 4-  Middle trapezius    Lower trapezius    Elbow flexion    Elbow extension    Wrist flexion    Wrist extension    Wrist ulnar deviation    Wrist radial deviation    Wrist pronation    Wrist supination    Grip strength (lbs)    (Blank rows = not tested)  SHOULDER SPECIAL TESTS: Deferred  due to pain and limited mobility  JOINT MOBILITY TESTING:  deferred  PALPATION:  TTP posterior L  shoulder over infraspinatus and teres maj/min   TODAY'S TREATMENT:                                                                                                                                         DATE: 05/18/23 Eval and HEP   PATIENT EDUCATION: Education details: Discussed eval findings, rehab rationale and POC and patient is in agreement  Person educated: Patient Education method: Explanation Education comprehension: verbalized understanding and needs further education  HOME EXERCISE PROGRAM: Access Code: [email or web address] URL: [email or web address] Date: 05/18/2023 Prepared by: Gustavus Bryant  Exercises - Seated Shoulder Shrugs  - 2 x daily - 5 x weekly - 1 sets - 10 reps - Seated Scapular Retraction  - 2 x daily - 5 x weekly - 1 sets - 10 reps - Scapular Stability on Wall With Pillowcase (Hemiplegia)  - 2 x daily - 5 x weekly - 1 sets - 10 reps  ASSESSMENT:  CLINICAL IMPRESSION: Patient is a 72 y.o. female who was seen today for physical therapy evaluation and treatment for chronic L shoulder pain ongoing for 2 years and progressively worsening.  She is R hand dominant.  She presents with limited AROM due to pain as well as strength deficits and postural changes resulting in chronic soft tissue changes. Marked guarding noted with any active motion of L shoulder.  Patient will benefit from OPPT to regain ROM initially progressing to strengthening and functional tasks.  Patient may benefit from SA injection if progress stalls in PT or treatment becomes too painful.  OBJECTIVE IMPAIRMENTS: decreased activity tolerance, decreased endurance, decreased knowledge of condition, decreased mobility, decreased ROM, decreased strength, impaired UE functional use, postural dysfunction, and pain.   ACTIVITY LIMITATIONS: carrying, lifting, sleeping, dressing, and reach over head  PERSONAL FACTORS: Age, Fitness, Time since onset of injury/illness/exacerbation, and 1 comorbidity: OA  are  also affecting patient's functional outcome.   REHAB POTENTIAL: Good  CLINICAL DECISION MAKING: Stable/uncomplicated  EVALUATION COMPLEXITY: Low   GOALS: Goals reviewed with patient? No  SHORT TERM GOALS: Target date: 06/08/2023  Patient to demonstrate independence in HEP  Baseline: [id number] Goal status: INITIAL  2.  Decrease worst pain to 6/10 Baseline: 8/10 Goal status: INITIAL  3.  Patient to regain AROM of L shoulder to >90d Baseline:  A/PROM Right eval Left eval  Shoulder flexion 150d 80/80d  Shoulder extension    Shoulder abduction 150d 60/60d   Goal status: INITIAL    LONG TERM GOALS: Target date: 06/29/2023  Decrease worst pain to 4/10 Baseline: 8/10 Goal status: INITIAL  2.  Increase AROM L shoulder flexion and abduction to 150d Baseline:  A/PROM Right eval Left eval  Shoulder flexion 150d 80/80d  Shoulder extension    Shoulder abduction 150d 60/60d   Goal status: INITIAL  3.  Increase PROM L shoulder to 80d IR 70d ER Baseline:  A/PROM R 05/18/23 L 05/18/23  Shoulder internal rotation  /60d  Shoulder external rotation  /0d   Goal status: INITIAL  4.  Increase FOTO score to 59 Baseline: 44 Goal status: INITIAL  5.  Increase L shoulder strength to 4/5 Baseline:  MMT Right eval Left eval  Shoulder flexion 4 4-  Shoulder extension 4 4-  Shoulder abduction 4 4-  Shoulder adduction    Shoulder internal rotation 4 4-  Shoulder external rotation 4 4-   Goal status: INITIAL   PLAN:  PT FREQUENCY: 1-2x/week  PT DURATION: 6 weeks  PLANNED INTERVENTIONS: Therapeutic exercises, Therapeutic activity, Neuromuscular re-education, Balance training, Gait training, Patient/Family education, Self Care, Joint mobilization, DME instructions, Dry Needling, Electrical stimulation, Cryotherapy, Moist heat, Manual therapy, and Re-evaluation  PLAN FOR NEXT SESSION: HEP review and update, manual techniques as appropriate, aerobic tasks, ROM and  flexibility activities, strengthening and PREs, TPDN, gait and balance training as needed     Hildred Laser, PT 05/18/2023, 2:48 PM

## 2023-05-18 ENCOUNTER — Ambulatory Visit: Payer: 59 | Attending: Family Medicine

## 2023-05-18 DIAGNOSIS — M159 Polyosteoarthritis, unspecified: Secondary | ICD-10-CM | POA: Diagnosis not present

## 2023-05-18 DIAGNOSIS — M25512 Pain in left shoulder: Secondary | ICD-10-CM | POA: Diagnosis not present

## 2023-05-18 DIAGNOSIS — M6281 Muscle weakness (generalized): Secondary | ICD-10-CM | POA: Insufficient documentation

## 2023-05-18 DIAGNOSIS — G8929 Other chronic pain: Secondary | ICD-10-CM | POA: Insufficient documentation

## 2023-05-18 DIAGNOSIS — M7502 Adhesive capsulitis of left shoulder: Secondary | ICD-10-CM | POA: Diagnosis not present

## 2023-05-23 NOTE — Therapy (Signed)
OUTPATIENT PHYSICAL THERAPY TREATMENT NOTE   Patient Name: [name] MRN: [id number] DOB:[birth year], 72 y.o., female Today's Date: 05/24/2023  END OF SESSION:  PT End of Session - 05/24/23 1035     Visit Number 2    Number of Visits 12    Date for PT Re-Evaluation 07/13/23    Authorization Type UHC    PT Start Time 1040    PT Stop Time 1120    PT Time Calculation (min) 40 min    Activity Tolerance Patient tolerated treatment well    Behavior During Therapy WFL for tasks assessed/performed              Past Medical History:  Diagnosis Date   Anemia    Arthritis    Asthma    Blood transfusion without reported diagnosis    Patients believes 2015 when she overdosed on "medications"    Breast cancer (HCC)    Breast cancer of lower-outer quadrant of right female breast (HCC) 02/13/2015   Cataract    CHF, acute (HCC) 05/03/2012   Depression    Diabetes mellitus    Eczema    Fatty liver    Fatty infiltration of liver noted on 03/2012 CT scan   Fibromyalgia    Glaucoma    Heart disease    History of kidney stones    w/ hx of hydronephrosis - followed by [facility]   HIV nonspecific serology    2006: indeterminate HIV blood test, seen by ID, felt secondary to cross reacting antibodies with no further workup felt necessary at that time    Hyperlipidemia    Hypertension    Obesity    Personal history of radiation therapy 2016   right   Radiation 05/07/15-06/23/15   Right Breast   Past Surgical History:  Procedure Laterality Date   BREAST BIOPSY Left 02/10/2015   malignant    BREAST LUMPECTOMY Right 03/21/2015   CHOLECYSTECTOMY  2003   CYSTOSCOPY W/ LITHOLAPAXY / EHL     JOINT REPLACEMENT     bilateral knee replacement   LEFT AND RIGHT HEART CATHETERIZATION WITH CORONARY ANGIOGRAM N/A 05/02/2012   Procedure: LEFT AND RIGHT HEART CATHETERIZATION WITH CORONARY ANGIOGRAM;  Surgeon: Kathleene Hazel, MD;  Location: [facility] CATH LAB;  Service: Cardiovascular;   Laterality: N/A;   RADIOACTIVE SEED GUIDED PARTIAL MASTECTOMY WITH AXILLARY SENTINEL LYMPH NODE BIOPSY Right 03/21/2015   Procedure: RIGHT  PARTIAL MASTECTOMY WITH RADIOACTIVE SEED LOCALIZATION RIGHT  AXILLARY SENTINEL LYMPH NODE BIOPSY;  Surgeon: Claud Kelp, MD;  Location: [facility];  Service: General;  Laterality: Right;   REPLACEMENT TOTAL KNEE BILATERAL  2005 &2006   VASCULAR SURGERY Right 03/15/2013   Ultrasound guided sclerotherapy   Patient Active Problem List   Diagnosis Date Noted   Renal disease due to diabetes mellitus (HCC) 04/28/2023   Hand pain, right 12/07/2022   Left shoulder pain 09/06/2022   Irregular heart rate 08/10/2022   Left breast mass 11/11/2021   Tinea pedis 04/09/2020   Spinal stenosis of lumbar region without neurogenic claudication 02/08/2019   Gout 05/16/2018   Gait instability 01/03/2018   Tremor of both hands    Stuttering    Essential hypertension    Hyperlipidemia 08/13/2015   Breast cancer of lower-outer quadrant of right female breast (HCC) 02/13/2015   Type 2 diabetes mellitus with diabetic foot deformity (HCC) 01/01/2014   Chronic combined systolic and diastolic heart failure (HCC) 02/26/2013   OBESITY 02/05/2009   Asthma, intermittent  02/08/2008   Neuropathy due to secondary diabetes (HCC) 05/18/2007   NEPHROLITHIASIS 01/26/2007   DJD (degenerative joint disease), multiple sites 01/26/2007    PCP: Carney Living, MD  REFERRING PROVIDER: Carney Living, MD  REFERRING DIAG: M15.9 (ICD-10-CM) - Primary osteoarthritis involving multiple joints  THERAPY DIAG:  Chronic left shoulder pain  Muscle weakness (generalized)  Rationale for Evaluation and Treatment: Rehabilitation  ONSET DATE: Chronic  SUBJECTIVE:                                                                                                                                                                                      SUBJECTIVE  STATEMENT: Pt presents to PT with reports of decrease in L shoulder pain. Has been compliant with HEP and notes initial improvement.   PERTINENT HISTORY: Chronic left shoulder pain Assessment & Plan: Consistent with DJD.  Her cancer is in remission.  Will refer to Physical Therapy.  If not improving as expected will need xrays   PAIN:  Are you having pain?  Yes: NPRS scale: 6/10 Pain location: L shoulder Pain description: ache Aggravating factors: activity and weight bearing Relieving factors: undetermined  PRECAUTIONS: None  WEIGHT BEARING RESTRICTIONS: No  FALLS:  Has patient fallen in last 6 months? No  OCCUPATION: retired  PLOF: Independent  PATIENT GOALS: To reduce and manage my shoulder pain  NEXT MD VISIT:   OBJECTIVE:   DIAGNOSTIC FINDINGS:  none  PATIENT SURVEYS:  FOTO 44  POSTURE: Depressed L shoulder, rounded shoulders  UPPER EXTREMITY ROM:   A/PROM Right eval Left eval  Shoulder flexion 150d 80/80d  Shoulder extension    Shoulder abduction 150d 60/60d  Shoulder adduction    Shoulder internal rotation  /60d  Shoulder external rotation  /0d  Elbow flexion    Elbow extension    Wrist flexion    Wrist extension    Wrist ulnar deviation    Wrist radial deviation    Wrist pronation    Wrist supination    (Blank rows = not tested)  UPPER EXTREMITY MMT:  MMT Right eval Left eval  Shoulder flexion 4 4-  Shoulder extension 4 4-  Shoulder abduction 4 4-  Shoulder adduction    Shoulder internal rotation 4 4-  Shoulder external rotation 4 4-  Middle trapezius    Lower trapezius    Elbow flexion    Elbow extension    Wrist flexion    Wrist extension    Wrist ulnar deviation    Wrist radial deviation    Wrist pronation    Wrist supination    Grip strength (lbs)    (Blank rows = not  tested)  SHOULDER SPECIAL TESTS: Deferred due to pain and limited mobility  JOINT MOBILITY TESTING:  deferred  PALPATION:  TTP posterior L  shoulder over infraspinatus and teres maj/min   TREATMENT: [facility] Adult PT Treatment:                                                DATE: 05/24/2023 Therapeutic Exercise: UBE lvl 1.0 x 3 min while taking subjective Seated scapular retraction x 10 Seated bilateral ER x 10 RTB - increased oaub Supine horizontal abd 2x10 RTB Supine dow flex 2x10  Supine dow chest press 2x10 Standing row 2x10 RTB Standing shoulder extr 2x10 RTB Manual Therapy: STM to L infraspinatus, L bicep tendon PROM into L shoulder IR/ER AP mobs to L shoulder grade II  PATIENT EDUCATION: Education details: continue HEP Person educated: Patient Education method: Explanation Education comprehension: verbalized understanding and needs further education  HOME EXERCISE PROGRAM: Access Code: [email or web address] URL: [email or web address] Date: 05/24/2023 Prepared by: Edwinna Areola  Exercises - Seated Shoulder Shrugs  - 2 x daily - 5 x weekly - 1 sets - 10 reps - Seated Scapular Retraction  - 2 x daily - 5 x weekly - 1 sets - 10 reps - Scapular Stability on Wall With Pillowcase (Hemiplegia)  - 2 x daily - 5 x weekly - 1 sets - 10 reps - Standing Shoulder Row with Anchored Resistance  - 1 x daily - 7 x weekly - 3 sets - 10 reps - red band hold  ASSESSMENT:  CLINICAL IMPRESSION: Pt was able to complete prescribed exercises with no adverse effect. Therapy focused on periscpaular strenghtening and manual therapy for decreasing pain. Responded well to manual therapy noting slight decrease in pain. HEP updated for continued periscapular strengthening. Will continue to progress per POC.   Patient is a 72 y.o. female who was seen today for physical therapy evaluation and treatment for chronic L shoulder pain ongoing for 2 years and progressively worsening.  She is R hand dominant.  She presents with limited AROM due to pain as well as strength deficits and postural changes resulting in chronic soft tissue changes. Marked  guarding noted with any active motion of L shoulder.  Patient will benefit from OPPT to regain ROM initially progressing to strengthening and functional tasks.  Patient may benefit from SA injection if progress stalls in PT or treatment becomes too painful.  OBJECTIVE IMPAIRMENTS: decreased activity tolerance, decreased endurance, decreased knowledge of condition, decreased mobility, decreased ROM, decreased strength, impaired UE functional use, postural dysfunction, and pain.   ACTIVITY LIMITATIONS: carrying, lifting, sleeping, dressing, and reach over head  PERSONAL FACTORS: Age, Fitness, Time since onset of injury/illness/exacerbation, and 1 comorbidity: OA  are also affecting patient's functional outcome.    GOALS: Goals reviewed with patient? No  SHORT TERM GOALS: Target date: 06/08/2023  Patient to demonstrate independence in HEP  Baseline: [email or web address] Goal status: INITIAL  2.  Decrease worst pain to 6/10 Baseline: 8/10 Goal status: INITIAL  3.  Patient to regain AROM of L shoulder to >90d Baseline:  A/PROM Right eval Left eval  Shoulder flexion 150d 80/80d  Shoulder extension    Shoulder abduction 150d 60/60d   Goal status: INITIAL    LONG TERM GOALS: Target date: 06/29/2023  Decrease worst pain to 4/10 Baseline: 8/10 Goal status: INITIAL  2.  Increase AROM L  shoulder flexion and abduction to 150d Baseline:  A/PROM Right eval Left eval  Shoulder flexion 150d 80/80d  Shoulder extension    Shoulder abduction 150d 60/60d   Goal status: INITIAL  3.  Increase PROM L shoulder to 80d IR 70d ER Baseline:  A/PROM R 05/18/23 L 05/18/23  Shoulder internal rotation  /60d  Shoulder external rotation  /0d   Goal status: INITIAL  4.  Increase FOTO score to 59 Baseline: 44 Goal status: INITIAL  5.  Increase L shoulder strength to 4/5 Baseline:  MMT Right eval Left eval  Shoulder flexion 4 4-  Shoulder extension 4 4-  Shoulder abduction 4 4-  Shoulder  adduction    Shoulder internal rotation 4 4-  Shoulder external rotation 4 4-   Goal status: INITIAL   PLAN:  PT FREQUENCY: 1-2x/week  PT DURATION: 6 weeks  PLANNED INTERVENTIONS: Therapeutic exercises, Therapeutic activity, Neuromuscular re-education, Balance training, Gait training, Patient/Family education, Self Care, Joint mobilization, DME instructions, Dry Needling, Electrical stimulation, Cryotherapy, Moist heat, Manual therapy, and Re-evaluation  PLAN FOR NEXT SESSION: HEP review and update, manual techniques as appropriate, aerobic tasks, ROM and flexibility activities, strengthening and PREs, TPDN, gait and balance training as needed     Eloy End, PT 05/24/2023, 11:24 AM

## 2023-05-24 ENCOUNTER — Ambulatory Visit: Payer: 59

## 2023-05-24 DIAGNOSIS — M7502 Adhesive capsulitis of left shoulder: Secondary | ICD-10-CM | POA: Diagnosis not present

## 2023-05-24 DIAGNOSIS — M25512 Pain in left shoulder: Secondary | ICD-10-CM | POA: Diagnosis not present

## 2023-05-24 DIAGNOSIS — M6281 Muscle weakness (generalized): Secondary | ICD-10-CM | POA: Diagnosis not present

## 2023-05-24 DIAGNOSIS — G8929 Other chronic pain: Secondary | ICD-10-CM

## 2023-05-24 DIAGNOSIS — M159 Polyosteoarthritis, unspecified: Secondary | ICD-10-CM | POA: Diagnosis not present

## 2023-05-26 NOTE — Therapy (Signed)
OUTPATIENT PHYSICAL THERAPY TREATMENT NOTE   Patient Name: [name] MRN: [id number] DOB:1951/04/08, 72 y.o., female Today's Date: 05/30/2023  END OF SESSION:  PT End of Session - 05/30/23 1123     Visit Number 3    Number of Visits 12    Date for PT Re-Evaluation 07/13/23    Authorization Type UHC    PT Start Time 1130    PT Stop Time 1210    PT Time Calculation (min) 40 min    Activity Tolerance Patient tolerated treatment well    Behavior During Therapy [facility] for tasks assessed/performed              Past Medical History:  Diagnosis Date   Anemia    Arthritis    Asthma    Blood transfusion without reported diagnosis    Patients believes 2015 when she overdosed on "medications"    Breast cancer (HCC)    Breast cancer of lower-outer quadrant of right female breast (HCC) 02/13/2015   Cataract    CHF, acute (HCC) 05/03/2012   Depression    Diabetes mellitus    Eczema    Fatty liver    Fatty infiltration of liver noted on 03/2012 CT scan   Fibromyalgia    Glaucoma    Heart disease    History of kidney stones    w/ hx of hydronephrosis - followed by [facility]   HIV nonspecific serology    2006: indeterminate HIV blood test, seen by ID, felt secondary to cross reacting antibodies with no further workup felt necessary at that time    Hyperlipidemia    Hypertension    Obesity    Personal history of radiation therapy 2016   right   Radiation 05/07/15-06/23/15   Right Breast   Past Surgical History:  Procedure Laterality Date   BREAST BIOPSY Left 02/10/2015   malignant    BREAST LUMPECTOMY Right 03/21/2015   CHOLECYSTECTOMY  2003   CYSTOSCOPY W/ LITHOLAPAXY / EHL     JOINT REPLACEMENT     bilateral knee replacement   LEFT AND RIGHT HEART CATHETERIZATION WITH CORONARY ANGIOGRAM N/A 05/02/2012   Procedure: LEFT AND RIGHT HEART CATHETERIZATION WITH CORONARY ANGIOGRAM;  Surgeon: Kathleene Hazel, MD;  Location: [facility] CATH LAB;  Service: Cardiovascular;   Laterality: N/A;   RADIOACTIVE SEED GUIDED PARTIAL MASTECTOMY WITH AXILLARY SENTINEL LYMPH NODE BIOPSY Right 03/21/2015   Procedure: RIGHT  PARTIAL MASTECTOMY WITH RADIOACTIVE SEED LOCALIZATION RIGHT  AXILLARY SENTINEL LYMPH NODE BIOPSY;  Surgeon: Claud Kelp, MD;  Location: [facility];  Service: General;  Laterality: Right;   REPLACEMENT TOTAL KNEE BILATERAL  2005 &2006   VASCULAR SURGERY Right 03/15/2013   Ultrasound guided sclerotherapy   Patient Active Problem List   Diagnosis Date Noted   Renal disease due to diabetes mellitus (HCC) 04/28/2023   Hand pain, right 12/07/2022   Left shoulder pain 09/06/2022   Irregular heart rate 08/10/2022   Left breast mass 11/11/2021   Tinea pedis 04/09/2020   Spinal stenosis of lumbar region without neurogenic claudication 02/08/2019   Gout 05/16/2018   Gait instability 01/03/2018   Tremor of both hands    Stuttering    Essential hypertension    Hyperlipidemia 08/13/2015   Breast cancer of lower-outer quadrant of right female breast (HCC) 02/13/2015   Type 2 diabetes mellitus with diabetic foot deformity (HCC) 01/01/2014   Chronic combined systolic and diastolic heart failure (HCC) 02/26/2013   OBESITY 02/05/2009   Asthma, intermittent  02/08/2008   Neuropathy due to secondary diabetes (HCC) 05/18/2007   NEPHROLITHIASIS 01/26/2007   DJD (degenerative joint disease), multiple sites 01/26/2007    PCP: Carney Living, MD  REFERRING PROVIDER: Carney Living, MD  REFERRING DIAG: M15.9 (ICD-10-CM) - Primary osteoarthritis involving multiple joints  THERAPY DIAG:  Chronic left shoulder pain  Adhesive bursitis of left shoulder  Muscle weakness (generalized)  Rationale for Evaluation and Treatment: Rehabilitation  ONSET DATE: Chronic  SUBJECTIVE:                                                                                                                                                                                       SUBJECTIVE STATEMENT: Still noting 7/10 shoulder pain with reaching and OH lifting tasks.  Less resting pain reported.  PERTINENT HISTORY: Chronic left shoulder pain Assessment & Plan: Consistent with DJD.  Her cancer is in remission.  Will refer to Physical Therapy.  If not improving as expected will need xrays   PAIN:  Are you having pain?  Yes: NPRS scale: 6/10 Pain location: L shoulder Pain description: ache Aggravating factors: activity and weight bearing Relieving factors: undetermined  PRECAUTIONS: None  WEIGHT BEARING RESTRICTIONS: No  FALLS:  Has patient fallen in last 6 months? No  OCCUPATION: retired  PLOF: Independent  PATIENT GOALS: To reduce and manage my shoulder pain  NEXT MD VISIT:   OBJECTIVE:   DIAGNOSTIC FINDINGS:  none  PATIENT SURVEYS:  FOTO 44  POSTURE: Depressed L shoulder, rounded shoulders  UPPER EXTREMITY ROM:   A/PROM Right eval Left eval  Shoulder flexion 150d 80/80d  Shoulder extension    Shoulder abduction 150d 60/60d  Shoulder adduction    Shoulder internal rotation  /60d  Shoulder external rotation  /0d  Elbow flexion    Elbow extension    Wrist flexion    Wrist extension    Wrist ulnar deviation    Wrist radial deviation    Wrist pronation    Wrist supination    (Blank rows = not tested)  UPPER EXTREMITY MMT:  MMT Right eval Left eval  Shoulder flexion 4 4-  Shoulder extension 4 4-  Shoulder abduction 4 4-  Shoulder adduction    Shoulder internal rotation 4 4-  Shoulder external rotation 4 4-  Middle trapezius    Lower trapezius    Elbow flexion    Elbow extension    Wrist flexion    Wrist extension    Wrist ulnar deviation    Wrist radial deviation    Wrist pronation    Wrist supination    Grip strength (lbs)    (Blank rows = not  tested)  SHOULDER SPECIAL TESTS: Deferred due to pain and limited mobility  JOINT MOBILITY TESTING:  deferred  PALPATION:  TTP posterior L  shoulder over infraspinatus and teres maj/min   TREATMENT: [facility] Adult PT Treatment:                                                DATE: 05/30/23 Therapeutic Exercise: OH pulley 5 min Supine OH flexion w/stick 15x Supine press and protract w/stick 15x ER YTB 15x2 Hor abduction supine YTB 15x2 Manual Therapy: Supine D1 F/E 15x Joint mobs inferior, distraction and posterior 5x10 Grade II 4 way scapula 15x ea. PROM into L shoulder IR/ER  [facility] Adult PT Treatment:                                                DATE: 05/24/2023 Therapeutic Exercise: UBE lvl 1.0 x 3 min while taking subjective Seated scapular retraction x 10 Seated bilateral ER x 10 RTB - increased oaub Supine horizontal abd 2x10 RTB Supine dow flex 2x10  Supine dow chest press 2x10 Standing row 2x10 RTB Standing shoulder extr 2x10 RTB Manual Therapy: STM to L infraspinatus, L bicep tendon PROM into L shoulder IR/ER AP mobs to L shoulder grade II  PATIENT EDUCATION: Education details: continue HEP Person educated: Patient Education method: Explanation Education comprehension: verbalized understanding and needs further education  HOME EXERCISE PROGRAM: Access Code: [email or web address] URL: [email or web address] Date: 05/24/2023 Prepared by: Edwinna Areola  Exercises - Seated Shoulder Shrugs  - 2 x daily - 5 x weekly - 1 sets - 10 reps - Seated Scapular Retraction  - 2 x daily - 5 x weekly - 1 sets - 10 reps - Scapular Stability on Wall With Pillowcase (Hemiplegia)  - 2 x daily - 5 x weekly - 1 sets - 10 reps - Standing Shoulder Row with Anchored Resistance  - 1 x daily - 7 x weekly - 3 sets - 10 reps - red band hold  ASSESSMENT:  CLINICAL IMPRESSION: Focus of today was manual therapy, periscapular strengthening followed by AROM and stretching.  Marked guarding noted throughout shoulder girdle with minimal scapular excursion noted across thorax.  Marked soft tissue restrictions remain in ER and unable to  tolerate greater than Grade II mobs.  Patient is a 72 y.o. female who was seen today for physical therapy evaluation and treatment for chronic L shoulder pain ongoing for 2 years and progressively worsening.  She is R hand dominant.  She presents with limited AROM due to pain as well as strength deficits and postural changes resulting in chronic soft tissue changes. Marked guarding noted with any active motion of L shoulder.  Patient will benefit from OPPT to regain ROM initially progressing to strengthening and functional tasks.  Patient may benefit from SA injection if progress stalls in PT or treatment becomes too painful.  OBJECTIVE IMPAIRMENTS: decreased activity tolerance, decreased endurance, decreased knowledge of condition, decreased mobility, decreased ROM, decreased strength, impaired UE functional use, postural dysfunction, and pain.   ACTIVITY LIMITATIONS: carrying, lifting, sleeping, dressing, and reach over head  PERSONAL FACTORS: Age, Fitness, Time since onset of injury/illness/exacerbation, and 1 comorbidity: OA  are also affecting patient's functional outcome.    GOALS:  Goals reviewed with patient? No  SHORT TERM GOALS: Target date: 06/08/2023  Patient to demonstrate independence in HEP  Baseline: [id number] Goal status: INITIAL  2.  Decrease worst pain to 6/10 Baseline: 8/10 Goal status: INITIAL  3.  Patient to regain AROM of L shoulder to >90d Baseline:  A/PROM Right eval Left eval  Shoulder flexion 150d 80/80d  Shoulder extension    Shoulder abduction 150d 60/60d   Goal status: INITIAL    LONG TERM GOALS: Target date: 06/29/2023  Decrease worst pain to 4/10 Baseline: 8/10 Goal status: INITIAL  2.  Increase AROM L shoulder flexion and abduction to 150d Baseline:  A/PROM Right eval Left eval  Shoulder flexion 150d 80/80d  Shoulder extension    Shoulder abduction 150d 60/60d   Goal status: INITIAL  3.  Increase PROM L shoulder to 80d IR 70d  ER Baseline:  A/PROM R 05/18/23 L 05/18/23  Shoulder internal rotation  /60d  Shoulder external rotation  /0d   Goal status: INITIAL  4.  Increase FOTO score to 59 Baseline: 44 Goal status: INITIAL  5.  Increase L shoulder strength to 4/5 Baseline:  MMT Right eval Left eval  Shoulder flexion 4 4-  Shoulder extension 4 4-  Shoulder abduction 4 4-  Shoulder adduction    Shoulder internal rotation 4 4-  Shoulder external rotation 4 4-   Goal status: INITIAL   PLAN:  PT FREQUENCY: 1-2x/week  PT DURATION: 6 weeks  PLANNED INTERVENTIONS: Therapeutic exercises, Therapeutic activity, Neuromuscular re-education, Balance training, Gait training, Patient/Family education, Self Care, Joint mobilization, DME instructions, Dry Needling, Electrical stimulation, Cryotherapy, Moist heat, Manual therapy, and Re-evaluation  PLAN FOR NEXT SESSION: HEP review and update, manual techniques as appropriate, aerobic tasks, ROM and flexibility activities, strengthening and PREs, TPDN, gait and balance training as needed     Hildred Laser, PT 05/30/2023, 12:15 PM

## 2023-05-30 ENCOUNTER — Ambulatory Visit: Payer: 59 | Attending: Family Medicine

## 2023-05-30 DIAGNOSIS — M7502 Adhesive capsulitis of left shoulder: Secondary | ICD-10-CM | POA: Insufficient documentation

## 2023-05-30 DIAGNOSIS — M25512 Pain in left shoulder: Secondary | ICD-10-CM | POA: Insufficient documentation

## 2023-05-30 DIAGNOSIS — M6281 Muscle weakness (generalized): Secondary | ICD-10-CM | POA: Diagnosis not present

## 2023-05-30 DIAGNOSIS — G8929 Other chronic pain: Secondary | ICD-10-CM | POA: Insufficient documentation

## 2023-05-30 NOTE — Therapy (Signed)
OUTPATIENT PHYSICAL THERAPY TREATMENT NOTE   Patient Name: [name] MRN: [id number] DOB:02-15-1951, 72 y.o., female Today's Date: 05/31/2023  END OF SESSION:  PT End of Session - 05/31/23 1055     Visit Number 4    Number of Visits 12    Date for PT Re-Evaluation 07/13/23    Authorization Type UHC    PT Start Time 1130    PT Stop Time 1208    PT Time Calculation (min) 38 min    Activity Tolerance Patient tolerated treatment well    Behavior During Therapy [facility] for tasks assessed/performed               Past Medical History:  Diagnosis Date   Anemia    Arthritis    Asthma    Blood transfusion without reported diagnosis    Patients believes 2015 when she overdosed on "medications"    Breast cancer (HCC)    Breast cancer of lower-outer quadrant of right female breast (HCC) 02/13/2015   Cataract    CHF, acute (HCC) 05/03/2012   Depression    Diabetes mellitus    Eczema    Fatty liver    Fatty infiltration of liver noted on 03/2012 CT scan   Fibromyalgia    Glaucoma    Heart disease    History of kidney stones    w/ hx of hydronephrosis - followed by [facility]   HIV nonspecific serology    2006: indeterminate HIV blood test, seen by ID, felt secondary to cross reacting antibodies with no further workup felt necessary at that time    Hyperlipidemia    Hypertension    Obesity    Personal history of radiation therapy 2016   right   Radiation 05/07/15-06/23/15   Right Breast   Past Surgical History:  Procedure Laterality Date   BREAST BIOPSY Left 02/10/2015   malignant    BREAST LUMPECTOMY Right 03/21/2015   CHOLECYSTECTOMY  2003   CYSTOSCOPY W/ LITHOLAPAXY / EHL     JOINT REPLACEMENT     bilateral knee replacement   LEFT AND RIGHT HEART CATHETERIZATION WITH CORONARY ANGIOGRAM N/A 05/02/2012   Procedure: LEFT AND RIGHT HEART CATHETERIZATION WITH CORONARY ANGIOGRAM;  Surgeon: Kathleene Hazel, MD;  Location: [facility] CATH LAB;  Service: Cardiovascular;   Laterality: N/A;   RADIOACTIVE SEED GUIDED PARTIAL MASTECTOMY WITH AXILLARY SENTINEL LYMPH NODE BIOPSY Right 03/21/2015   Procedure: RIGHT  PARTIAL MASTECTOMY WITH RADIOACTIVE SEED LOCALIZATION RIGHT  AXILLARY SENTINEL LYMPH NODE BIOPSY;  Surgeon: Claud Kelp, MD;  Location: [facility];  Service: General;  Laterality: Right;   REPLACEMENT TOTAL KNEE BILATERAL  2005 &2006   VASCULAR SURGERY Right 03/15/2013   Ultrasound guided sclerotherapy   Patient Active Problem List   Diagnosis Date Noted   Renal disease due to diabetes mellitus (HCC) 04/28/2023   Hand pain, right 12/07/2022   Left shoulder pain 09/06/2022   Irregular heart rate 08/10/2022   Left breast mass 11/11/2021   Tinea pedis 04/09/2020   Spinal stenosis of lumbar region without neurogenic claudication 02/08/2019   Gout 05/16/2018   Gait instability 01/03/2018   Tremor of both hands    Stuttering    Essential hypertension    Hyperlipidemia 08/13/2015   Breast cancer of lower-outer quadrant of right female breast (HCC) 02/13/2015   Type 2 diabetes mellitus with diabetic foot deformity (HCC) 01/01/2014   Chronic combined systolic and diastolic heart failure (HCC) 02/26/2013   OBESITY 02/05/2009   Asthma,  intermittent 02/08/2008   Neuropathy due to secondary diabetes (HCC) 05/18/2007   NEPHROLITHIASIS 01/26/2007   DJD (degenerative joint disease), multiple sites 01/26/2007    PCP: Carney Living, MD  REFERRING PROVIDER: Carney Living, MD  REFERRING DIAG: M15.9 (ICD-10-CM) - Primary osteoarthritis involving multiple joints  THERAPY DIAG:  Chronic left shoulder pain  Muscle weakness (generalized)  Rationale for Evaluation and Treatment: Rehabilitation  ONSET DATE: Chronic  SUBJECTIVE:                                                                                                                                                                                      SUBJECTIVE  STATEMENT: Pt presents to PT with increased L shoulder pain. Has been compliant with HEP.    PERTINENT HISTORY: Chronic left shoulder pain Assessment & Plan: Consistent with DJD.  Her cancer is in remission.  Will refer to Physical Therapy.  If not improving as expected will need xrays   PAIN:  Are you having pain?  Yes: NPRS scale: 7/10 Pain location: L shoulder Pain description: ache Aggravating factors: activity and weight bearing Relieving factors: undetermined  PRECAUTIONS: None  WEIGHT BEARING RESTRICTIONS: No  FALLS:  Has patient fallen in last 6 months? No  OCCUPATION: retired  PLOF: Independent  PATIENT GOALS: To reduce and manage my shoulder pain  NEXT MD VISIT:   OBJECTIVE:   DIAGNOSTIC FINDINGS:  none  PATIENT SURVEYS:  FOTO 44  POSTURE: Depressed L shoulder, rounded shoulders  UPPER EXTREMITY ROM:   A/PROM Right eval Left eval Left 05/31/2023  Shoulder flexion 150d 80/80d 78d  Shoulder extension     Shoulder abduction 150d 60/60d   Shoulder adduction     Shoulder internal rotation  /60d   Shoulder external rotation  /0d   Elbow flexion     Elbow extension     Wrist flexion     Wrist extension     Wrist ulnar deviation     Wrist radial deviation     Wrist pronation     Wrist supination     (Blank rows = not tested)  UPPER EXTREMITY MMT:  MMT Right eval Left eval  Shoulder flexion 4 4-  Shoulder extension 4 4-  Shoulder abduction 4 4-  Shoulder adduction    Shoulder internal rotation 4 4-  Shoulder external rotation 4 4-  Middle trapezius    Lower trapezius    Elbow flexion    Elbow extension    Wrist flexion    Wrist extension    Wrist ulnar deviation    Wrist radial deviation    Wrist pronation    Wrist supination  Grip strength (lbs)    (Blank rows = not tested)  SHOULDER SPECIAL TESTS: Deferred due to pain and limited mobility  JOINT MOBILITY TESTING:  deferred  PALPATION:  TTP posterior L shoulder over  infraspinatus and teres maj/min   TREATMENT: [facility] Adult PT Treatment:                                                DATE: 05/31/2023 Therapeutic Exercise: UBE lvl 1.0 x 3 min while taking subjective Pulleys shoulder flex x 2 min Seated scapular retraction x 10 Supine horizontal abd x 10 YTB Supine dow flex 2x10  Supine dow chest press protrac/retrac x 10 Standing row 2x10 RTB Standing shoulder extr 2x10 RTB Manual Therapy: STM to L infraspinatus, L bicep tendon PROM into L shoulder IR/ER AP mobs to L shoulder grade II  [facility] Adult PT Treatment:                                                DATE: 05/30/23 Therapeutic Exercise: OH pulley 5 min Supine OH flexion w/stick 15x Supine press and protract w/stick 15x ER YTB 15x2 Hor abduction supine YTB 15x2 Manual Therapy: Supine D1 F/E 15x Joint mobs inferior, distraction and posterior 5x10 Grade II 4 way scapula 15x ea. PROM into L shoulder IR/ER  [facility] Adult PT Treatment:                                                DATE: 05/24/2023 Therapeutic Exercise: UBE lvl 1.0 x 3 min while taking subjective Seated scapular retraction x 10 Seated bilateral ER x 10 RTB - increased oaub Supine horizontal abd 2x10 RTB Supine dow flex 2x10  Supine dow chest press 2x10 Standing row 2x10 RTB Standing shoulder extr 2x10 RTB Manual Therapy: STM to L infraspinatus, L bicep tendon PROM into L shoulder IR/ER AP mobs to L shoulder grade II  PATIENT EDUCATION: Education details: continue HEP Person educated: Patient Education method: Explanation Education comprehension: verbalized understanding and needs further education  HOME EXERCISE PROGRAM: Access Code: [email or web address] URL: [email or web address] Date: 05/24/2023 Prepared by: Edwinna Areola  Exercises - Seated Shoulder Shrugs  - 2 x daily - 5 x weekly - 1 sets - 10 reps - Seated Scapular Retraction  - 2 x daily - 5 x weekly - 1 sets - 10 reps - Scapular Stability on Wall With  Pillowcase (Hemiplegia)  - 2 x daily - 5 x weekly - 1 sets - 10 reps - Standing Shoulder Row with Anchored Resistance  - 1 x daily - 7 x weekly - 3 sets - 10 reps - red band hold  ASSESSMENT:  CLINICAL IMPRESSION: Pt was able to complete prescribed exercises with no adverse effect. Therapy focused on periscpaular strenghtening and manual therapy for decreasing pain. Responded well to manual therapy noting slight decrease in pain. Will continue to progress per POC.   Patient is a 72 y.o. female who was seen today for physical therapy evaluation and treatment for chronic L shoulder pain ongoing for 2 years and progressively worsening.  She is R hand dominant.  She presents with limited AROM due to pain as well as strength deficits and postural changes resulting in chronic soft tissue changes. Marked guarding noted with any active motion of L shoulder.  Patient will benefit from OPPT to regain ROM initially progressing to strengthening and functional tasks.  Patient may benefit from SA injection if progress stalls in PT or treatment becomes too painful.  OBJECTIVE IMPAIRMENTS: decreased activity tolerance, decreased endurance, decreased knowledge of condition, decreased mobility, decreased ROM, decreased strength, impaired UE functional use, postural dysfunction, and pain.   ACTIVITY LIMITATIONS: carrying, lifting, sleeping, dressing, and reach over head  PERSONAL FACTORS: Age, Fitness, Time since onset of injury/illness/exacerbation, and 1 comorbidity: OA  are also affecting patient's functional outcome.    GOALS: Goals reviewed with patient? No  SHORT TERM GOALS: Target date: 06/08/2023  Patient to demonstrate independence in HEP  Baseline: [id number] Goal status: INITIAL  2.  Decrease worst pain to 6/10 Baseline: 8/10 Goal status: INITIAL  3.  Patient to regain AROM of L shoulder to >90d Baseline:  A/PROM Right eval Left eval  Shoulder flexion 150d 80/80d  Shoulder extension     Shoulder abduction 150d 60/60d   Goal status: INITIAL    LONG TERM GOALS: Target date: 06/29/2023  Decrease worst pain to 4/10 Baseline: 8/10 Goal status: INITIAL  2.  Increase AROM L shoulder flexion and abduction to 150d Baseline:  A/PROM Right eval Left eval  Shoulder flexion 150d 80/80d  Shoulder extension    Shoulder abduction 150d 60/60d   Goal status: INITIAL  3.  Increase PROM L shoulder to 80d IR 70d ER Baseline:  A/PROM R 05/18/23 L 05/18/23  Shoulder internal rotation  /60d  Shoulder external rotation  /0d   Goal status: INITIAL  4.  Increase FOTO score to 59 Baseline: 44 Goal status: INITIAL  5.  Increase L shoulder strength to 4/5 Baseline:  MMT Right eval Left eval  Shoulder flexion 4 4-  Shoulder extension 4 4-  Shoulder abduction 4 4-  Shoulder adduction    Shoulder internal rotation 4 4-  Shoulder external rotation 4 4-   Goal status: INITIAL   PLAN:  PT FREQUENCY: 1-2x/week  PT DURATION: 6 weeks  PLANNED INTERVENTIONS: Therapeutic exercises, Therapeutic activity, Neuromuscular re-education, Balance training, Gait training, Patient/Family education, Self Care, Joint mobilization, DME instructions, Dry Needling, Electrical stimulation, Cryotherapy, Moist heat, Manual therapy, and Re-evaluation  PLAN FOR NEXT SESSION: HEP review and update, manual techniques as appropriate, aerobic tasks, ROM and flexibility activities, strengthening and PREs, TPDN, gait and balance training as needed     Eloy End, PT 05/31/2023, 12:12 PM

## 2023-05-31 ENCOUNTER — Ambulatory Visit: Payer: 59

## 2023-05-31 DIAGNOSIS — G8929 Other chronic pain: Secondary | ICD-10-CM

## 2023-05-31 DIAGNOSIS — M6281 Muscle weakness (generalized): Secondary | ICD-10-CM

## 2023-05-31 DIAGNOSIS — M7502 Adhesive capsulitis of left shoulder: Secondary | ICD-10-CM | POA: Diagnosis not present

## 2023-05-31 DIAGNOSIS — M25512 Pain in left shoulder: Secondary | ICD-10-CM | POA: Diagnosis not present

## 2023-06-05 NOTE — Therapy (Unsigned)
OUTPATIENT PHYSICAL THERAPY TREATMENT NOTE   Patient Name: [name] MRN: [id number] DOB:04/18/1951, 72 y.o., female Today's Date: 05/31/2023  END OF SESSION:  PT End of Session - 05/31/23 1055     Visit Number 4    Number of Visits 12    Date for PT Re-Evaluation 07/13/23    Authorization Type UHC    PT Start Time 1130    PT Stop Time 1208    PT Time Calculation (min) 38 min    Activity Tolerance Patient tolerated treatment well    Behavior During Therapy WFL for tasks assessed/performed               Past Medical History:  Diagnosis Date   Anemia    Arthritis    Asthma    Blood transfusion without reported diagnosis    Patients believes 2015 when she overdosed on "medications"    Breast cancer (HCC)    Breast cancer of lower-outer quadrant of right female breast (HCC) 02/13/2015   Cataract    CHF, acute (HCC) 05/03/2012   Depression    Diabetes mellitus    Eczema    Fatty liver    Fatty infiltration of liver noted on 03/2012 CT scan   Fibromyalgia    Glaucoma    Heart disease    History of kidney stones    w/ hx of hydronephrosis - followed by [facility]   HIV nonspecific serology    2006: indeterminate HIV blood test, seen by ID, felt secondary to cross reacting antibodies with no further workup felt necessary at that time    Hyperlipidemia    Hypertension    Obesity    Personal history of radiation therapy 2016   right   Radiation 05/07/15-06/23/15   Right Breast   Past Surgical History:  Procedure Laterality Date   BREAST BIOPSY Left 02/10/2015   malignant    BREAST LUMPECTOMY Right 03/21/2015   CHOLECYSTECTOMY  2003   CYSTOSCOPY W/ LITHOLAPAXY / [facility]     JOINT REPLACEMENT     bilateral knee replacement   LEFT AND RIGHT HEART CATHETERIZATION WITH CORONARY ANGIOGRAM N/A 05/02/2012   Procedure: LEFT AND RIGHT HEART CATHETERIZATION WITH CORONARY ANGIOGRAM;  Surgeon: Christopher D McAlhany, MD;  Location: [facility]ATH [facility];  Service: Cardiovascular;   Laterality: N/A;   RADIOACTIVE SEED GUIDED PARTIAL MASTECTOMY WITH AXILLARY SENTINEL LYMPH NODE BIOPSY Right 03/21/2015   Procedure: RIGHT  PARTIAL MASTECTOMY WITH RADIOACTIVE SEED LOCALIZATION RIGHT  AXILLARY SENTINEL LYMPH NODE BIOPSY;  Surgeon: Haywood Loveridge, MD;  Location: [facility];  Service: General;  Laterality: Right;   REPLACEMENT TOTAL KNEE BILATERAL  2005 &2006   VASCULAR SURGERY Right 03/15/2013   Ultrasound guided sclerotherapy   Patient Active Problem List   Diagnosis Date Noted   Renal disease due to diabetes mellitus (HCC) 04/28/2023   Hand pain, right 12/07/2022   Left shoulder pain 09/06/2022   Irregular heart rate 08/10/2022   Left breast mass 11/11/2021   Tinea pedis 04/09/2020   Spinal stenosis of lumbar region without neurogenic claudication 02/08/2019   Gout 05/16/2018   Gait instability 01/03/2018   Tremor of both hands    Stuttering    Essential hypertension    Hyperlipidemia 08/13/2015   Breast cancer of lower-outer quadrant of right female breast (HCC) 02/13/2015   Type 2 diabetes mellitus with diabetic foot deformity (HCC) 01/01/2014   Chronic combined systolic and diastolic heart failure (HCC) 02/26/2013   OBESITY 02/05/2009   Asthma,   intermittent 02/08/2008   Neuropathy due to secondary diabetes (HCC) 05/18/2007   NEPHROLITHIASIS 01/26/2007   DJD (degenerative joint disease), multiple sites 01/26/2007    PCP: Chambliss, Marshall L, MD  REFERRING PROVIDER: Chambliss, Marshall L, MD  REFERRING DIAG: M15.9 (ICD-10-CM) - Primary osteoarthritis involving multiple joints  THERAPY DIAG:  Chronic left shoulder pain  Muscle weakness (generalized)  Rationale for Evaluation and Treatment: Rehabilitation  ONSET DATE: Chronic  SUBJECTIVE:                                                                                                                                                                                      SUBJECTIVE  STATEMENT: Pt presents to PT with increased L shoulder pain. Has been compliant with HEP.    PERTINENT HISTORY: Chronic left shoulder pain Assessment & Plan: Consistent with DJD.  Her cancer is in remission.  Will refer to Physical Therapy.  If not improving as expected will need xrays   PAIN:  Are you having pain?  Yes: NPRS scale: 7/10 Pain location: L shoulder Pain description: ache Aggravating factors: activity and weight bearing Relieving factors: undetermined  PRECAUTIONS: None  WEIGHT BEARING RESTRICTIONS: No  FALLS:  Has patient fallen in last 6 months? No  OCCUPATION: retired  PLOF: Independent  PATIENT GOALS: To reduce and manage my shoulder pain  NEXT MD VISIT:   OBJECTIVE:   DIAGNOSTIC FINDINGS:  none  PATIENT SURVEYS:  FOTO 44  POSTURE: Depressed L shoulder, rounded shoulders  UPPER EXTREMITY ROM:   A/PROM Right eval Left eval Left 05/31/2023  Shoulder flexion 150d 80/80d 78d  Shoulder extension     Shoulder abduction 150d 60/60d   Shoulder adduction     Shoulder internal rotation  /60d   Shoulder external rotation  /0d   Elbow flexion     Elbow extension     Wrist flexion     Wrist extension     Wrist ulnar deviation     Wrist radial deviation     Wrist pronation     Wrist supination     (Blank rows = not tested)  UPPER EXTREMITY MMT:  MMT Right eval Left eval  Shoulder flexion 4 4-  Shoulder extension 4 4-  Shoulder abduction 4 4-  Shoulder adduction    Shoulder internal rotation 4 4-  Shoulder external rotation 4 4-  Middle trapezius    Lower trapezius    Elbow flexion    Elbow extension    Wrist flexion    Wrist extension    Wrist ulnar deviation    Wrist radial deviation    Wrist pronation    Wrist supination      Grip strength (lbs)    (Blank rows = not tested)  SHOULDER SPECIAL TESTS: Deferred due to pain and limited mobility  JOINT MOBILITY TESTING:  deferred  PALPATION:  TTP posterior L shoulder over  infraspinatus and teres maj/min   TREATMENT: [facility] Adult PT Treatment:                                                DATE: 05/31/2023 Therapeutic Exercise: UBE lvl 1.0 x 3 min while taking subjective Pulleys shoulder flex x 2 min Seated scapular retraction x 10 Supine horizontal abd x 10 YTB Supine dow flex 2x10  Supine dow chest press protrac/retrac x 10 Standing row 2x10 RTB Standing shoulder extr 2x10 RTB Manual Therapy: STM to L infraspinatus, L bicep tendon PROM into L shoulder IR/ER AP mobs to L shoulder grade II  [facility] Adult PT Treatment:                                                DATE: 05/30/23 Therapeutic Exercise: OH pulley 5 min Supine OH flexion w/stick 15x Supine press and protract w/stick 15x ER YTB 15x2 Hor abduction supine YTB 15x2 Manual Therapy: Supine D1 F/E 15x Joint mobs inferior, distraction and posterior 5x10 Grade II 4 way scapula 15x ea. PROM into L shoulder IR/ER  [facility] Adult PT Treatment:                                                DATE: 05/24/2023 Therapeutic Exercise: UBE lvl 1.0 x 3 min while taking subjective Seated scapular retraction x 10 Seated bilateral ER x 10 RTB - increased oaub Supine horizontal abd 2x10 RTB Supine dow flex 2x10  Supine dow chest press 2x10 Standing row 2x10 RTB Standing shoulder extr 2x10 RTB Manual Therapy: STM to L infraspinatus, L bicep tendon PROM into L shoulder IR/ER AP mobs to L shoulder grade II  PATIENT EDUCATION: Education details: continue HEP Person educated: Patient Education method: Explanation Education comprehension: verbalized understanding and needs further education  HOME EXERCISE PROGRAM: Access Code: [email or web address] URL: [email or web address] Date: 05/24/2023 Prepared by: David Stroup  Exercises - Seated Shoulder Shrugs  - 2 x daily - 5 x weekly - 1 sets - 10 reps - Seated Scapular Retraction  - 2 x daily - 5 x weekly - 1 sets - 10 reps - Scapular Stability on Wall With  Pillowcase (Hemiplegia)  - 2 x daily - 5 x weekly - 1 sets - 10 reps - Standing Shoulder Row with Anchored Resistance  - 1 x daily - 7 x weekly - 3 sets - 10 reps - red band hold  ASSESSMENT:  CLINICAL IMPRESSION: Pt was able to complete prescribed exercises with no adverse effect. Therapy focused on periscpaular strenghtening and manual therapy for decreasing pain. Responded well to manual therapy noting slight decrease in pain. Will continue to progress per POC.   Patient is a 72 y.o. female who was seen today for physical therapy evaluation and treatment for chronic L shoulder pain ongoing for 2 years and progressively worsening.  She is R hand dominant.    She presents with limited AROM due to pain as well as strength deficits and postural changes resulting in chronic soft tissue changes. Marked guarding noted with any active motion of L shoulder.  Patient will benefit from OPPT to regain ROM initially progressing to strengthening and functional tasks.  Patient may benefit from SA injection if progress stalls in PT or treatment becomes too painful.  OBJECTIVE IMPAIRMENTS: decreased activity tolerance, decreased endurance, decreased knowledge of condition, decreased mobility, decreased ROM, decreased strength, impaired UE functional use, postural dysfunction, and pain.   ACTIVITY LIMITATIONS: carrying, lifting, sleeping, dressing, and reach over head  PERSONAL FACTORS: Age, Fitness, Time since onset of injury/illness/exacerbation, and 1 comorbidity: OA  are also affecting patient's functional outcome.    GOALS: Goals reviewed with patient? No  SHORT TERM GOALS: Target date: 06/08/2023  Patient to demonstrate independence in HEP  Baseline: [id number] Goal status: INITIAL  2.  Decrease worst pain to 6/10 Baseline: 8/10 Goal status: INITIAL  3.  Patient to regain AROM of L shoulder to >90d Baseline:  A/PROM Right eval Left eval  Shoulder flexion 150d 80/80d  Shoulder extension     Shoulder abduction 150d 60/60d   Goal status: INITIAL    LONG TERM GOALS: Target date: 06/29/2023  Decrease worst pain to 4/10 Baseline: 8/10 Goal status: INITIAL  2.  Increase AROM L shoulder flexion and abduction to 150d Baseline:  A/PROM Right eval Left eval  Shoulder flexion 150d 80/80d  Shoulder extension    Shoulder abduction 150d 60/60d   Goal status: INITIAL  3.  Increase PROM L shoulder to 80d IR 70d ER Baseline:  A/PROM R 05/18/23 L 05/18/23  Shoulder internal rotation  /60d  Shoulder external rotation  /0d   Goal status: INITIAL  4.  Increase FOTO score to 59 Baseline: 44 Goal status: INITIAL  5.  Increase L shoulder strength to 4/5 Baseline:  MMT Right eval Left eval  Shoulder flexion 4 4-  Shoulder extension 4 4-  Shoulder abduction 4 4-  Shoulder adduction    Shoulder internal rotation 4 4-  Shoulder external rotation 4 4-   Goal status: INITIAL   PLAN:  PT FREQUENCY: 1-2x/week  PT DURATION: 6 weeks  PLANNED INTERVENTIONS: Therapeutic exercises, Therapeutic activity, Neuromuscular re-education, Balance training, Gait training, Patient/Family education, Self Care, Joint mobilization, DME instructions, Dry Needling, Electrical stimulation, Cryotherapy, Moist heat, Manual therapy, and Re-evaluation  PLAN FOR NEXT SESSION: HEP review and update, manual techniques as appropriate, aerobic tasks, ROM and flexibility activities, strengthening and PREs, TPDN, gait and balance training as needed     David C Stroup, PT 05/31/2023, 12:12 PM  

## 2023-06-06 ENCOUNTER — Ambulatory Visit: Payer: 59

## 2023-06-06 DIAGNOSIS — M7502 Adhesive capsulitis of left shoulder: Secondary | ICD-10-CM

## 2023-06-06 DIAGNOSIS — G8929 Other chronic pain: Secondary | ICD-10-CM

## 2023-06-06 DIAGNOSIS — M6281 Muscle weakness (generalized): Secondary | ICD-10-CM | POA: Diagnosis not present

## 2023-06-06 DIAGNOSIS — M25512 Pain in left shoulder: Secondary | ICD-10-CM | POA: Diagnosis not present

## 2023-06-07 NOTE — Therapy (Signed)
OUTPATIENT PHYSICAL THERAPY TREATMENT NOTE   Patient Name: [name] MRN: [id number] DOB:17-Apr-1951, 72 y.o., female Today's Date: 06/06/2023  END OF SESSION:  PT End of Session - 06/06/23 1006     Visit Number 5    Number of Visits 12    Date for PT Re-Evaluation 07/13/23    Authorization Type UHC    PT Start Time 1000    PT Stop Time 1040    PT Time Calculation (min) 40 min    Activity Tolerance Patient tolerated treatment well    Behavior During Therapy WFL for tasks assessed/performed                Past Medical History:  Diagnosis Date   Anemia    Arthritis    Asthma    Blood transfusion without reported diagnosis    Patients believes 2015 when she overdosed on "medications"    Breast cancer (HCC)    Breast cancer of lower-outer quadrant of right female breast (HCC) 02/13/2015   Cataract    CHF, acute (HCC) 05/03/2012   Depression    Diabetes mellitus    Eczema    Fatty liver    Fatty infiltration of liver noted on 03/2012 CT scan   Fibromyalgia    Glaucoma    Heart disease    History of kidney stones    w/ hx of hydronephrosis - followed by [facility]   HIV nonspecific serology    2006: indeterminate HIV blood test, seen by ID, felt secondary to cross reacting antibodies with no further workup felt necessary at that time    Hyperlipidemia    Hypertension    Obesity    Personal history of radiation therapy 2016   right   Radiation 05/07/15-06/23/15   Right Breast   Past Surgical History:  Procedure Laterality Date   BREAST BIOPSY Left 02/10/2015   malignant    BREAST LUMPECTOMY Right 03/21/2015   CHOLECYSTECTOMY  2003   CYSTOSCOPY W/ LITHOLAPAXY / [facility]     JOINT REPLACEMENT     bilateral knee replacement   LEFT AND RIGHT HEART CATHETERIZATION WITH CORONARY ANGIOGRAM N/A 05/02/2012   Procedure: LEFT AND RIGHT HEART CATHETERIZATION WITH CORONARY ANGIOGRAM;  Surgeon: Kathleene Hazel, MD;  Location: [facility] CATH [facility];  Service:  Cardiovascular;  Laterality: N/A;   RADIOACTIVE SEED GUIDED PARTIAL MASTECTOMY WITH AXILLARY SENTINEL LYMPH NODE BIOPSY Right 03/21/2015   Procedure: RIGHT  PARTIAL MASTECTOMY WITH RADIOACTIVE SEED LOCALIZATION RIGHT  AXILLARY SENTINEL LYMPH NODE BIOPSY;  Surgeon: Claud Kelp, MD;  Location: [facility];  Service: General;  Laterality: Right;   REPLACEMENT TOTAL KNEE BILATERAL  2005 &2006   VASCULAR SURGERY Right 03/15/2013   Ultrasound guided sclerotherapy   Patient Active Problem List   Diagnosis Date Noted   Renal disease due to diabetes mellitus (HCC) 04/28/2023   Hand pain, right 12/07/2022   Left shoulder pain 09/06/2022   Irregular heart rate 08/10/2022   Left breast mass 11/11/2021   Tinea pedis 04/09/2020   Spinal stenosis of lumbar region without neurogenic claudication 02/08/2019   Gout 05/16/2018   Gait instability 01/03/2018   Tremor of both hands    Stuttering    Essential hypertension    Hyperlipidemia 08/13/2015   Breast cancer of lower-outer quadrant of right female breast (HCC) 02/13/2015   Type 2 diabetes mellitus with diabetic foot deformity (HCC) 01/01/2014   Chronic combined systolic and diastolic heart failure (HCC) 02/26/2013   OBESITY 02/05/2009  Asthma, intermittent 02/08/2008   Neuropathy due to secondary diabetes (HCC) 05/18/2007   NEPHROLITHIASIS 01/26/2007   DJD (degenerative joint disease), multiple sites 01/26/2007    PCP: Carney Living, MD  REFERRING PROVIDER: Carney Living, MD  REFERRING DIAG: M15.9 (ICD-10-CM) - Primary osteoarthritis involving multiple joints  THERAPY DIAG:  Chronic left shoulder pain  Muscle weakness (generalized)  Adhesive bursitis of left shoulder  Rationale for Evaluation and Treatment: Rehabilitation  ONSET DATE: Chronic  SUBJECTIVE:                                                                                                                                                                                       SUBJECTIVE STATEMENT: L shoulder pain unchanging, pain levels at 8/10  PERTINENT HISTORY: Chronic left shoulder pain Assessment & Plan: Consistent with DJD.  Her cancer is in remission.  Will refer to Physical Therapy.  If not improving as expected will need xrays   PAIN:  Are you having pain?  Yes: NPRS scale: 7/10 Pain location: L shoulder Pain description: ache Aggravating factors: activity and weight bearing Relieving factors: undetermined  PRECAUTIONS: None  WEIGHT BEARING RESTRICTIONS: No  FALLS:  Has patient fallen in last 6 months? No  OCCUPATION: retired  PLOF: Independent  PATIENT GOALS: To reduce and manage my shoulder pain  NEXT MD VISIT:   OBJECTIVE:   DIAGNOSTIC FINDINGS:  none  PATIENT SURVEYS:  FOTO 44  POSTURE: Depressed L shoulder, rounded shoulders  UPPER EXTREMITY ROM:   A/PROM Right eval Left eval Left 05/31/2023  Shoulder flexion 150d 80/80d 78d  Shoulder extension     Shoulder abduction 150d 60/60d   Shoulder adduction     Shoulder internal rotation  /60d   Shoulder external rotation  /0d   Elbow flexion     Elbow extension     Wrist flexion     Wrist extension     Wrist ulnar deviation     Wrist radial deviation     Wrist pronation     Wrist supination     (Blank rows = not tested)  UPPER EXTREMITY MMT:  MMT Right eval Left eval  Shoulder flexion 4 4-  Shoulder extension 4 4-  Shoulder abduction 4 4-  Shoulder adduction    Shoulder internal rotation 4 4-  Shoulder external rotation 4 4-  Middle trapezius    Lower trapezius    Elbow flexion    Elbow extension    Wrist flexion    Wrist extension    Wrist ulnar deviation    Wrist radial deviation    Wrist pronation    Wrist supination  Grip strength (lbs)    (Blank rows = not tested)  SHOULDER SPECIAL TESTS: Deferred due to pain and limited mobility  JOINT MOBILITY TESTING:  deferred  PALPATION:  TTP posterior L  shoulder over infraspinatus and teres maj/min   TREATMENT: [facility] Adult PT Treatment:                                                DATE: 06/06/23 Therapeutic Exercise: OH pulleys 6 min Supine press w/stick 15x Supine OH flexion w/stick 15x Seated ER YTB 15x Seated row YTB 15x Manual Therapy: Supine D1 F/E 15x Joint mobs inferior, distraction and posterior 5x10 Grade II 4 way scapula 15x ea. PROM into L shoulder IR/ER   [facility] Adult PT Treatment:                                                DATE: 05/31/2023 Therapeutic Exercise: UBE lvl 1.0 x 3 min while taking subjective Pulleys shoulder flex x 2 min Seated scapular retraction x 10 Supine horizontal abd x 10 YTB Supine dow flex 2x10  Supine dow chest press protrac/retrac x 10 Standing row 2x10 RTB Standing shoulder extr 2x10 RTB Manual Therapy: STM to L infraspinatus, L bicep tendon PROM into L shoulder IR/ER AP mobs to L shoulder grade II  [facility] Adult PT Treatment:                                                DATE: 05/30/23 Therapeutic Exercise: OH pulley 5 min Supine OH flexion w/stick 15x Supine press and protract w/stick 15x ER YTB 15x2 Hor abduction supine YTB 15x2 Manual Therapy: Supine D1 F/E 15x Joint mobs inferior, distraction and posterior 5x10 Grade II 4 way scapula 15x ea. PROM into L shoulder IR/ER  [facility] Adult PT Treatment:                                                DATE: 05/24/2023 Therapeutic Exercise: UBE lvl 1.0 x 3 min while taking subjective Seated scapular retraction x 10 Seated bilateral ER x 10 RTB - increased oaub Supine horizontal abd 2x10 RTB Supine dow flex 2x10  Supine dow chest press 2x10 Standing row 2x10 RTB Standing shoulder extr 2x10 RTB Manual Therapy: STM to L infraspinatus, L bicep tendon PROM into L shoulder IR/ER AP mobs to L shoulder grade II  PATIENT EDUCATION: Education details: continue HEP Person educated: Patient Education method: Explanation Education  comprehension: verbalized understanding and needs further education  HOME EXERCISE PROGRAM: Access Code: [email or web address] URL: [email or web address] Date: 05/24/2023 Prepared by: Edwinna Areola  Exercises - Seated Shoulder Shrugs  - 2 x daily - 5 x weekly - 1 sets - 10 reps - Seated Scapular Retraction  - 2 x daily - 5 x weekly - 1 sets - 10 reps - Scapular Stability on Wall With Pillowcase (Hemiplegia)  - 2 x daily - 5 x weekly - 1 sets - 10 reps - Standing  Shoulder Row with Anchored Resistance  - 1 x daily - 7 x weekly - 3 sets - 10 reps - red band hold  ASSESSMENT:  CLINICAL IMPRESSION: Continued to address shoulder pain and loss of mobility.  Pain remains guarded throughout shoulder girdle, no change in AROM due to pain with poor tolerance to gentle manual techniques.  Began more active ROM tasks today.  Recommended patient reach out to referring MD regarding unchanging symptoms.  Patient is a 72 y.o. female who was seen today for physical therapy evaluation and treatment for chronic L shoulder pain ongoing for 2 years and progressively worsening.  She is R hand dominant.  She presents with limited AROM due to pain as well as strength deficits and postural changes resulting in chronic soft tissue changes. Marked guarding noted with any active motion of L shoulder.  Patient will benefit from OPPT to regain ROM initially progressing to strengthening and functional tasks.  Patient may benefit from SA injection if progress stalls in PT or treatment becomes too painful.  OBJECTIVE IMPAIRMENTS: decreased activity tolerance, decreased endurance, decreased knowledge of condition, decreased mobility, decreased ROM, decreased strength, impaired UE functional use, postural dysfunction, and pain.   ACTIVITY LIMITATIONS: carrying, lifting, sleeping, dressing, and reach over head  PERSONAL FACTORS: Age, Fitness, Time since onset of injury/illness/exacerbation, and 1 comorbidity: OA  are also  affecting patient's functional outcome.    GOALS: Goals reviewed with patient? No  SHORT TERM GOALS: Target date: 06/08/2023  Patient to demonstrate independence in HEP  Baseline: [id number] Goal status: INITIAL  2.  Decrease worst pain to 6/10 Baseline: 8/10 Goal status: INITIAL  3.  Patient to regain AROM of L shoulder to >90d Baseline:  A/PROM Right eval Left eval  Shoulder flexion 150d 80/80d  Shoulder extension    Shoulder abduction 150d 60/60d   Goal status: INITIAL    LONG TERM GOALS: Target date: 06/29/2023  Decrease worst pain to 4/10 Baseline: 8/10 Goal status: INITIAL  2.  Increase AROM L shoulder flexion and abduction to 150d Baseline:  A/PROM Right eval Left eval  Shoulder flexion 150d 80/80d  Shoulder extension    Shoulder abduction 150d 60/60d   Goal status: INITIAL  3.  Increase PROM L shoulder to 80d IR 70d ER Baseline:  A/PROM R 05/18/23 L 05/18/23  Shoulder internal rotation  /60d  Shoulder external rotation  /0d   Goal status: INITIAL  4.  Increase FOTO score to 59 Baseline: 44 Goal status: INITIAL  5.  Increase L shoulder strength to 4/5 Baseline:  MMT Right eval Left eval  Shoulder flexion 4 4-  Shoulder extension 4 4-  Shoulder abduction 4 4-  Shoulder adduction    Shoulder internal rotation 4 4-  Shoulder external rotation 4 4-   Goal status: INITIAL   PLAN:  PT FREQUENCY: 1-2x/week  PT DURATION: 6 weeks  PLANNED INTERVENTIONS: Therapeutic exercises, Therapeutic activity, Neuromuscular re-education, Balance training, Gait training, Patient/Family education, Self Care, Joint mobilization, DME instructions, Dry Needling, Electrical stimulation, Cryotherapy, Moist heat, Manual therapy, and Re-evaluation  PLAN FOR NEXT SESSION: HEP review and update, manual techniques as appropriate, aerobic tasks, ROM and flexibility activities, strengthening and PREs, TPDN, gait and balance training as needed     Hildred Laser,  PT 06/06/2023, 10:44 AM

## 2023-06-08 ENCOUNTER — Ambulatory Visit: Payer: 59

## 2023-06-08 DIAGNOSIS — M7502 Adhesive capsulitis of left shoulder: Secondary | ICD-10-CM | POA: Diagnosis not present

## 2023-06-08 DIAGNOSIS — G8929 Other chronic pain: Secondary | ICD-10-CM

## 2023-06-08 DIAGNOSIS — M6281 Muscle weakness (generalized): Secondary | ICD-10-CM | POA: Diagnosis not present

## 2023-06-08 DIAGNOSIS — M25512 Pain in left shoulder: Secondary | ICD-10-CM | POA: Diagnosis not present

## 2023-06-13 ENCOUNTER — Ambulatory Visit: Payer: 59

## 2023-06-13 DIAGNOSIS — M6281 Muscle weakness (generalized): Secondary | ICD-10-CM | POA: Diagnosis not present

## 2023-06-13 DIAGNOSIS — M25512 Pain in left shoulder: Secondary | ICD-10-CM | POA: Diagnosis not present

## 2023-06-13 DIAGNOSIS — M7502 Adhesive capsulitis of left shoulder: Secondary | ICD-10-CM | POA: Diagnosis not present

## 2023-06-13 DIAGNOSIS — G8929 Other chronic pain: Secondary | ICD-10-CM | POA: Diagnosis not present

## 2023-06-13 NOTE — Therapy (Signed)
OUTPATIENT PHYSICAL THERAPY TREATMENT NOTE   Patient Name: [name] MRN: [id number] DOB:[birth year], 72 y.o., female Today's Date: 06/13/2023  END OF SESSION:  PT End of Session - 06/13/23 0858     Visit Number 7    Number of Visits 12    Date for PT Re-Evaluation 07/13/23    Authorization Type UHC    PT Start Time 0900    PT Stop Time 0940    PT Time Calculation (min) 40 min    Activity Tolerance Patient limited by pain    Behavior During Therapy [facility] for tasks assessed/performed                 Past Medical History:  Diagnosis Date   Anemia    Arthritis    Asthma    Blood transfusion without reported diagnosis    Patients believes 2015 when she overdosed on "medications"    Breast cancer (HCC)    Breast cancer of lower-outer quadrant of right female breast (HCC) 02/13/2015   Cataract    CHF, acute (HCC) 05/03/2012   Depression    Diabetes mellitus    Eczema    Fatty liver    Fatty infiltration of liver noted on 03/2012 CT scan   Fibromyalgia    Glaucoma    Heart disease    History of kidney stones    w/ hx of hydronephrosis - followed by [facility]   HIV nonspecific serology    2006: indeterminate HIV blood test, seen by ID, felt secondary to cross reacting antibodies with no further workup felt necessary at that time    Hyperlipidemia    Hypertension    Obesity    Personal history of radiation therapy 2016   right   Radiation 05/07/15-06/23/15   Right Breast   Past Surgical History:  Procedure Laterality Date   BREAST BIOPSY Left 02/10/2015   malignant    BREAST LUMPECTOMY Right 03/21/2015   CHOLECYSTECTOMY  2003   CYSTOSCOPY W/ LITHOLAPAXY / EHL     JOINT REPLACEMENT     bilateral knee replacement   LEFT AND RIGHT HEART CATHETERIZATION WITH CORONARY ANGIOGRAM N/A 05/02/2012   Procedure: LEFT AND RIGHT HEART CATHETERIZATION WITH CORONARY ANGIOGRAM;  Surgeon: Kathleene Hazel, MD;  Location: [facility] CATH LAB;  Service: Cardiovascular;   Laterality: N/A;   RADIOACTIVE SEED GUIDED PARTIAL MASTECTOMY WITH AXILLARY SENTINEL LYMPH NODE BIOPSY Right 03/21/2015   Procedure: RIGHT  PARTIAL MASTECTOMY WITH RADIOACTIVE SEED LOCALIZATION RIGHT  AXILLARY SENTINEL LYMPH NODE BIOPSY;  Surgeon: Claud Kelp, MD;  Location: [facility];  Service: General;  Laterality: Right;   REPLACEMENT TOTAL KNEE BILATERAL  2005 &2006   VASCULAR SURGERY Right 03/15/2013   Ultrasound guided sclerotherapy   Patient Active Problem List   Diagnosis Date Noted   Renal disease due to diabetes mellitus (HCC) 04/28/2023   Hand pain, right 12/07/2022   Left shoulder pain 09/06/2022   Irregular heart rate 08/10/2022   Left breast mass 11/11/2021   Tinea pedis 04/09/2020   Spinal stenosis of lumbar region without neurogenic claudication 02/08/2019   Gout 05/16/2018   Gait instability 01/03/2018   Tremor of both hands    Stuttering    Essential hypertension    Hyperlipidemia 08/13/2015   Breast cancer of lower-outer quadrant of right female breast (HCC) 02/13/2015   Type 2 diabetes mellitus with diabetic foot deformity (HCC) 01/01/2014   Chronic combined systolic and diastolic heart failure (HCC) 02/26/2013   OBESITY 02/05/2009  Asthma, intermittent 02/08/2008   Neuropathy due to secondary diabetes (HCC) 05/18/2007   NEPHROLITHIASIS 01/26/2007   DJD (degenerative joint disease), multiple sites 01/26/2007    PCP: Carney Living, MD  REFERRING PROVIDER: Carney Living, MD  REFERRING DIAG: M15.9 (ICD-10-CM) - Primary osteoarthritis involving multiple joints  THERAPY DIAG:  Chronic left shoulder pain  Muscle weakness (generalized)  Rationale for Evaluation and Treatment: Rehabilitation  ONSET DATE: Chronic  SUBJECTIVE:                                                                                                                                                                                      SUBJECTIVE  STATEMENT: Pt presents to PT with reports of continued severe L shoulder pain. Was able to move up her MD visit to next week on 06/21/2023.    PERTINENT HISTORY: Chronic left shoulder pain Assessment & Plan: Consistent with DJD.  Her cancer is in remission.  Will refer to Physical Therapy.  If not improving as expected will need xrays   PAIN:  Are you having pain?  Yes: NPRS scale: 7/10 Pain location: L shoulder Pain description: ache Aggravating factors: activity and weight bearing Relieving factors: undetermined  PRECAUTIONS: None  WEIGHT BEARING RESTRICTIONS: No  FALLS:  Has patient fallen in last 6 months? No  OCCUPATION: retired  PLOF: Independent  PATIENT GOALS: To reduce and manage my shoulder pain  NEXT MD VISIT:   OBJECTIVE:   DIAGNOSTIC FINDINGS:  none  PATIENT SURVEYS:  FOTO 44  POSTURE: Depressed L shoulder, rounded shoulders  UPPER EXTREMITY ROM:   A/PROM Right eval Left eval Left 05/31/2023  Shoulder flexion 150d 80/80d 78d  Shoulder extension     Shoulder abduction 150d 60/60d   Shoulder adduction     Shoulder internal rotation  /60d   Shoulder external rotation  /0d   Elbow flexion     Elbow extension     Wrist flexion     Wrist extension     Wrist ulnar deviation     Wrist radial deviation     Wrist pronation     Wrist supination     (Blank rows = not tested)  UPPER EXTREMITY MMT:  MMT Right eval Left eval  Shoulder flexion 4 4-  Shoulder extension 4 4-  Shoulder abduction 4 4-  Shoulder adduction    Shoulder internal rotation 4 4-  Shoulder external rotation 4 4-  Middle trapezius    Lower trapezius    Elbow flexion    Elbow extension    Wrist flexion    Wrist extension    Wrist ulnar deviation    Wrist radial deviation  Wrist pronation    Wrist supination    Grip strength (lbs)    (Blank rows = not tested)  SHOULDER SPECIAL TESTS: Deferred due to pain and limited mobility  JOINT MOBILITY TESTING:   deferred  PALPATION:  TTP posterior L shoulder over infraspinatus and teres maj/min   TREATMENT: [facility] Adult PT Treatment:                                                DATE: 06/13/23 Therapeutic Exercise: OH pulleys x 3 min into shoulder flexion Supine press with dow 2x10 Supine OH flexion with dow 2x15 Supine horizontal abd 2x10 YTB Supine L shoulder flex 2x10 1# Seated ER YTB 2x10 Standing row 2x10 YTB Standing ext 2x10 YTB Manual Therapy: STM to L infraspinatus, L bicep tendon PROM into L shoulder flex/abd  [facility] Adult PT Treatment:                                                DATE: 06/08/23 Therapeutic Exercise: OH pulleys 5 min FOTO update Supine press w/stick 15x Supine OH flexion w/stick 15x Seated ER YTB 15x Seated row YTB 15x Supine press L 500g ball Supine flexion L 500g ball 15x Manual Therapy: Supine D1 F/E 15x Joint mobs inferior, distraction and posterior 5x10 Grade II 4 way scapula 15x ea. PROM into L shoulder IR/ER  [facility] Adult PT Treatment:                                                DATE: 06/06/23 Therapeutic Exercise: OH pulleys 6 min Supine press w/stick 15x Supine OH flexion w/stick 15x Seated ER YTB 15x Seated row YTB 15x Manual Therapy: Supine D1 F/E 15x Joint mobs inferior, distraction and posterior 5x10 Grade II 4 way scapula 15x ea. PROM into L shoulder IR/ER  PATIENT EDUCATION: Education details: continue HEP Person educated: Patient Education method: Explanation Education comprehension: verbalized understanding and needs further education  HOME EXERCISE PROGRAM: Access Code: [email or web address] URL: [email or web address] Date: 05/24/2023 Prepared by: Edwinna Areola  Exercises - Seated Shoulder Shrugs  - 2 x daily - 5 x weekly - 1 sets - 10 reps - Seated Scapular Retraction  - 2 x daily - 5 x weekly - 1 sets - 10 reps - Scapular Stability on Wall With Pillowcase (Hemiplegia)  - 2 x daily - 5 x weekly - 1 sets - 10 reps -  Standing Shoulder Row with Anchored Resistance  - 1 x daily - 7 x weekly - 3 sets - 10 reps - red band hold  ASSESSMENT:  CLINICAL IMPRESSION: Pt was able to complete all prescribed exercises with no adverse effect, although continued to note slight increases in pain. Continues to show significant impairment in L shoulder ROM and strength. After next session may be best to hold on PT until at least after her MD visit on 06/21/23.   OBJECTIVE IMPAIRMENTS: decreased activity tolerance, decreased endurance, decreased knowledge of condition, decreased mobility, decreased ROM, decreased strength, impaired UE functional use, postural dysfunction, and pain.   ACTIVITY LIMITATIONS: carrying, lifting, sleeping, dressing, and reach  over head  PERSONAL FACTORS: Age, Fitness, Time since onset of injury/illness/exacerbation, and 1 comorbidity: OA  are also affecting patient's functional outcome.    GOALS: Goals reviewed with patient? No  SHORT TERM GOALS: Target date: 06/08/2023  Patient to demonstrate independence in HEP  Baseline: [email or web address] Goal status: INITIAL  2.  Decrease worst pain to 6/10 Baseline: 8/10 Goal status: INITIAL  3.  Patient to regain AROM of L shoulder to >90d Baseline:  A/PROM Right eval Left eval  Shoulder flexion 150d 80/80d  Shoulder extension    Shoulder abduction 150d 60/60d   Goal status: INITIAL    LONG TERM GOALS: Target date: 06/29/2023  Decrease worst pain to 4/10 Baseline: 8/10 Goal status: INITIAL  2.  Increase AROM L shoulder flexion and abduction to 150d Baseline:  A/PROM Right eval Left eval  Shoulder flexion 150d 80/80d  Shoulder extension    Shoulder abduction 150d 60/60d   Goal status: INITIAL  3.  Increase PROM L shoulder to 80d IR 70d ER Baseline:  A/PROM R 05/18/23 L 05/18/23  Shoulder internal rotation  /60d  Shoulder external rotation  /0d   Goal status: INITIAL  4.  Increase FOTO score to 59 Baseline: 44 Goal status:  INITIAL  5.  Increase L shoulder strength to 4/5 Baseline:  MMT Right eval Left eval  Shoulder flexion 4 4-  Shoulder extension 4 4-  Shoulder abduction 4 4-  Shoulder adduction    Shoulder internal rotation 4 4-  Shoulder external rotation 4 4-   Goal status: INITIAL   PLAN:  PT FREQUENCY: 1-2x/week  PT DURATION: 6 weeks  PLANNED INTERVENTIONS: Therapeutic exercises, Therapeutic activity, Neuromuscular re-education, Balance training, Gait training, Patient/Family education, Self Care, Joint mobilization, DME instructions, Dry Needling, Electrical stimulation, Cryotherapy, Moist heat, Manual therapy, and Re-evaluation  PLAN FOR NEXT SESSION: HEP review and update, manual techniques as appropriate, aerobic tasks, ROM and flexibility activities, strengthening and PREs, TPDN, gait and balance training as needed     Eloy End, PT 06/13/2023, 9:48 AM

## 2023-06-14 NOTE — Therapy (Unsigned)
OUTPATIENT PHYSICAL THERAPY TREATMENT NOTE/DC SUMMARY   Patient Name: [name] MRN: [id number] DOB:16-Mar-1951, 72 y.o., female Today's Date: 06/15/2023 PHYSICAL THERAPY DISCHARGE SUMMARY  Visits from Start of Care: 8  Current functional level related to goals / functional outcomes: Unchanging pain and function   Remaining deficits: Pain, weakness and loss of ROM   Education / Equipment: HEP   Patient agrees to discharge. Patient goals were partially met. Patient is being discharged due to did not respond to therapy.   END OF SESSION:  PT End of Session - 06/15/23 0916     Visit Number 8    Number of Visits 12    Date for PT Re-Evaluation 07/13/23    Authorization Type UHC    PT Start Time 0915    PT Stop Time 1000    PT Time Calculation (min) 45 min    Activity Tolerance Patient limited by pain    Behavior During Therapy [facility] for tasks assessed/performed                  Past Medical History:  Diagnosis Date   Anemia    Arthritis    Asthma    Blood transfusion without reported diagnosis    Patients believes 2015 when she overdosed on "medications"    Breast cancer (HCC)    Breast cancer of lower-outer quadrant of right female breast (HCC) 02/13/2015   Cataract    CHF, acute (HCC) 05/03/2012   Depression    Diabetes mellitus    Eczema    Fatty liver    Fatty infiltration of liver noted on 03/2012 CT scan   Fibromyalgia    Glaucoma    Heart disease    History of kidney stones    w/ hx of hydronephrosis - followed by [facility]   HIV nonspecific serology    2006: indeterminate HIV blood test, seen by ID, felt secondary to cross reacting antibodies with no further workup felt necessary at that time    Hyperlipidemia    Hypertension    Obesity    Personal history of radiation therapy 2016   right   Radiation 05/07/15-06/23/15   Right Breast   Past Surgical History:  Procedure Laterality Date   BREAST BIOPSY Left 02/10/2015   malignant     BREAST LUMPECTOMY Right 03/21/2015   CHOLECYSTECTOMY  2003   CYSTOSCOPY W/ LITHOLAPAXY / EHL     JOINT REPLACEMENT     bilateral knee replacement   LEFT AND RIGHT HEART CATHETERIZATION WITH CORONARY ANGIOGRAM N/A 05/02/2012   Procedure: LEFT AND RIGHT HEART CATHETERIZATION WITH CORONARY ANGIOGRAM;  Surgeon: Kathleene Hazel, MD;  Location: [facility] CATH LAB;  Service: Cardiovascular;  Laterality: N/A;   RADIOACTIVE SEED GUIDED PARTIAL MASTECTOMY WITH AXILLARY SENTINEL LYMPH NODE BIOPSY Right 03/21/2015   Procedure: RIGHT  PARTIAL MASTECTOMY WITH RADIOACTIVE SEED LOCALIZATION RIGHT  AXILLARY SENTINEL LYMPH NODE BIOPSY;  Surgeon: Claud Kelp, MD;  Location: [facility];  Service: General;  Laterality: Right;   REPLACEMENT TOTAL KNEE BILATERAL  2005 &2006   VASCULAR SURGERY Right 03/15/2013   Ultrasound guided sclerotherapy   Patient Active Problem List   Diagnosis Date Noted   Renal disease due to diabetes mellitus (HCC) 04/28/2023   Hand pain, right 12/07/2022   Left shoulder pain 09/06/2022   Irregular heart rate 08/10/2022   Left breast mass 11/11/2021   Tinea pedis 04/09/2020   Spinal stenosis of lumbar region without neurogenic claudication 02/08/2019   Gout 05/16/2018  Gait instability 01/03/2018   Tremor of both hands    Stuttering    Essential hypertension    Hyperlipidemia 08/13/2015   Breast cancer of lower-outer quadrant of right female breast (HCC) 02/13/2015   Type 2 diabetes mellitus with diabetic foot deformity (HCC) 01/01/2014   Chronic combined systolic and diastolic heart failure (HCC) 02/26/2013   OBESITY 02/05/2009   Asthma, intermittent 02/08/2008   Neuropathy due to secondary diabetes (HCC) 05/18/2007   NEPHROLITHIASIS 01/26/2007   DJD (degenerative joint disease), multiple sites 01/26/2007    PCP: Carney Living, MD  REFERRING PROVIDER: Carney Living, MD  REFERRING DIAG: M15.9 (ICD-10-CM) - Primary osteoarthritis involving  multiple joints  THERAPY DIAG:  Chronic left shoulder pain  Muscle weakness (generalized)  Adhesive bursitis of left shoulder  Rationale for Evaluation and Treatment: Rehabilitation  ONSET DATE: Chronic  SUBJECTIVE:                                                                                                                                                                                      SUBJECTIVE STATEMENT: Unchanging shoulder pain and function.  Agreeable to D and f/u with MD regarding re-assessment    PERTINENT HISTORY: Chronic left shoulder pain Assessment & Plan: Consistent with DJD.  Her cancer is in remission.  Will refer to Physical Therapy.  If not improving as expected will need xrays   PAIN:  Are you having pain?  Yes: NPRS scale: 7/10 Pain location: L shoulder Pain description: ache Aggravating factors: activity and weight bearing Relieving factors: undetermined  PRECAUTIONS: None  WEIGHT BEARING RESTRICTIONS: No  FALLS:  Has patient fallen in last 6 months? No  OCCUPATION: retired  PLOF: Independent  PATIENT GOALS: To reduce and manage my shoulder pain  NEXT MD VISIT:   OBJECTIVE:   DIAGNOSTIC FINDINGS:  none  PATIENT SURVEYS:  FOTO 44  POSTURE: Depressed L shoulder, rounded shoulders  UPPER EXTREMITY ROM:   A/PROM Right eval Left eval Left 05/31/2023 L 06/15/23  Shoulder flexion 150d 80/80d 78d 75d  Shoulder extension    30d  Shoulder abduction 150d 60/60d  60d  Shoulder adduction      Shoulder internal rotation  /60d  L gluteal  Shoulder external rotation  /0d  L ear  Elbow flexion      Elbow extension      Wrist flexion      Wrist extension      Wrist ulnar deviation      Wrist radial deviation      Wrist pronation      Wrist supination      (Blank rows = not tested)  UPPER EXTREMITY  MMT:  MMT Right eval Left eval L 06/15/23  Shoulder flexion 4 4- 4-  Shoulder extension 4 4- 4  Shoulder abduction 4 4- 4-   Shoulder adduction     Shoulder internal rotation 4 4- 4  Shoulder external rotation 4 4- 4  Middle trapezius     Lower trapezius     Elbow flexion     Elbow extension     Wrist flexion     Wrist extension     Wrist ulnar deviation     Wrist radial deviation     Wrist pronation     Wrist supination     Grip strength (lbs)     (Blank rows = not tested)  SHOULDER SPECIAL TESTS: Deferred due to pain and limited mobility  JOINT MOBILITY TESTING:  deferred  PALPATION:  TTP posterior L shoulder over infraspinatus and teres maj/min   TREATMENT: [facility] Adult PT Treatment:                                                DATE: 06/15/23 Therapeutic Exercise: ER YTB 15x2 Elbow flex YTB 15x2 Elbow extension YTB 15x2 Seated scaption 500g ball 15x2 Supine press 500g ball 15x2 Supine hoe abd YTB 15x2 Seated row 2x10 YTB Seated ext 2x10 YTB  [facility] Adult PT Treatment:                                                DATE: 06/13/23 Therapeutic Exercise: OH pulleys x 3 min into shoulder flexion Supine press with dow 2x10 Supine OH flexion with dow 2x15 Supine horizontal abd 2x10 YTB Supine L shoulder flex 2x10 1# Seated ER YTB 2x10 Standing row 2x10 YTB Standing ext 2x10 YTB Manual Therapy: STM to L infraspinatus, L bicep tendon PROM into L shoulder flex/abd  [facility] Adult PT Treatment:                                                DATE: 06/08/23 Therapeutic Exercise: OH pulleys 5 min FOTO update Supine press w/stick 15x Supine OH flexion w/stick 15x Seated ER YTB 15x Seated row YTB 15x Supine press L 500g ball Supine flexion L 500g ball 15x Manual Therapy: Supine D1 F/E 15x Joint mobs inferior, distraction and posterior 5x10 Grade II 4 way scapula 15x ea. PROM into L shoulder IR/ER  [facility] Adult PT Treatment:                                                DATE: 06/06/23 Therapeutic Exercise: OH pulleys 6 min Supine press w/stick 15x Supine OH flexion w/stick 15x Seated ER YTB  15x Seated row YTB 15x Manual Therapy: Supine D1 F/E 15x Joint mobs inferior, distraction and posterior 5x10 Grade II 4 way scapula 15x ea. PROM into L shoulder IR/ER  PATIENT EDUCATION: Education details: continue HEP Person educated: Patient Education method: Explanation Education comprehension: verbalized understanding and needs further education  HOME EXERCISE PROGRAM: Access  Code: [email or web address] URL: [email or web address] Date: 05/24/2023 Prepared by: Edwinna Areola  Exercises - Seated Shoulder Shrugs  - 2 x daily - 5 x weekly - 1 sets - 10 reps - Seated Scapular Retraction  - 2 x daily - 5 x weekly - 1 sets - 10 reps - Scapular Stability on Wall With Pillowcase (Hemiplegia)  - 2 x daily - 5 x weekly - 1 sets - 10 reps - Standing Shoulder Row with Anchored Resistance  - 1 x daily - 7 x weekly - 3 sets - 10 reps - red band hold  ASSESSMENT:  CLINICAL IMPRESSION: Patient presents with unrelnting pain in L shoulder.  ROM, strength and function have not improved due to pain levels.  Recommend return to MD for f/u to determine ongoing cause of symptoms.  Findings suggest underlying arthritic changes an adhesive capsulitis.  OBJECTIVE IMPAIRMENTS: decreased activity tolerance, decreased endurance, decreased knowledge of condition, decreased mobility, decreased ROM, decreased strength, impaired UE functional use, postural dysfunction, and pain.   ACTIVITY LIMITATIONS: carrying, lifting, sleeping, dressing, and reach over head  PERSONAL FACTORS: Age, Fitness, Time since onset of injury/illness/exacerbation, and 1 comorbidity: OA  are also affecting patient's functional outcome.    GOALS: Goals reviewed with patient? No  SHORT TERM GOALS: Target date: 06/08/2023  Patient to demonstrate independence in HEP  Baseline: [id number] Goal status: Met  2.  Decrease worst pain to 6/10 Baseline: 8/10; 06/15/23 8/10 Goal status: Not met  3.  Patient to regain AROM of L shoulder  to >90d Baseline:  A/PROM Right eval Left eval Left 05/31/2023 L 06/15/23  Shoulder flexion 150d 80/80d 78d 75d  Shoulder extension    30d  Shoulder abduction 150d 60/60d  60d  Shoulder adduction      Shoulder internal rotation  /60d  L gluteal  Shoulder external rotation  /0d  L ear   Goal status: Not met    LONG TERM GOALS: Target date: 06/29/2023  Decrease worst pain to 4/10 Baseline: 8/10; 06/15/23 Goal status: Not met  2.  Increase AROM L shoulder flexion and abduction to 150d Baseline:  A/PROM Right eval Left eval Left 05/31/2023 L 06/15/23  Shoulder flexion 150d 80/80d 78d 75d  Shoulder extension    30d  Shoulder abduction 150d 60/60d  60d  Shoulder adduction      Shoulder internal rotation  /60d  L gluteal  Shoulder external rotation  /0d  L ear   Goal status: Not met  3.  Increase PROM L shoulder to 80d IR 70d ER Baseline:  A/PROM Right eval Left eval Left 05/31/2023 L 06/15/23  Shoulder flexion 150d 80/80d 78d 75d  Shoulder extension    30d  Shoulder abduction 150d 60/60d  60d  Shoulder adduction      Shoulder internal rotation  /60d  L gluteal/70d  Shoulder external rotation  /0d  L ear/0d   Goal status: Not met  4.  Increase FOTO score to 59 Baseline: 44; 06/15/23 41 Goal status: Not met  5.  Increase L shoulder strength to 4/5 Baseline:  MMT Right eval Left eval L 06/15/23  Shoulder flexion 4 4- 4-  Shoulder extension 4 4- 4  Shoulder abduction 4 4- 4-  Shoulder adduction     Shoulder internal rotation 4 4- 4  Shoulder external rotation 4 4- 4   Goal status: Not met   PLAN:  PT FREQUENCY: 1-2x/week  PT DURATION: 6 weeks  PLANNED INTERVENTIONS: Therapeutic exercises, Therapeutic activity, Neuromuscular re-education, Balance training, Gait  training, Patient/Family education, Self Care, Joint mobilization, DME instructions, Dry Needling, Electrical stimulation, Cryotherapy, Moist heat, Manual therapy, and Re-evaluation  PLAN FOR NEXT  SESSION: DC to MD care    Hildred Laser, PT 06/15/2023, 9:18 AM

## 2023-06-15 ENCOUNTER — Ambulatory Visit: Payer: 59

## 2023-06-15 DIAGNOSIS — M7502 Adhesive capsulitis of left shoulder: Secondary | ICD-10-CM

## 2023-06-15 DIAGNOSIS — G8929 Other chronic pain: Secondary | ICD-10-CM | POA: Diagnosis not present

## 2023-06-15 DIAGNOSIS — M25512 Pain in left shoulder: Secondary | ICD-10-CM | POA: Diagnosis not present

## 2023-06-15 DIAGNOSIS — M6281 Muscle weakness (generalized): Secondary | ICD-10-CM

## 2023-06-16 ENCOUNTER — Telehealth: Payer: Self-pay | Admitting: Podiatry

## 2023-06-16 NOTE — Telephone Encounter (Signed)
Left message on vm for pt to call back to schedule p/u diabetic shoes

## 2023-06-21 ENCOUNTER — Encounter: Payer: Self-pay | Admitting: Family Medicine

## 2023-06-21 ENCOUNTER — Other Ambulatory Visit: Payer: Self-pay

## 2023-06-21 ENCOUNTER — Ambulatory Visit (INDEPENDENT_AMBULATORY_CARE_PROVIDER_SITE_OTHER): Payer: 59 | Admitting: Family Medicine

## 2023-06-21 VITALS — BP 138/64 | HR 60 | Ht 62.0 in | Wt 217.6 lb

## 2023-06-21 DIAGNOSIS — E1121 Type 2 diabetes mellitus with diabetic nephropathy: Secondary | ICD-10-CM | POA: Diagnosis not present

## 2023-06-21 DIAGNOSIS — M159 Polyosteoarthritis, unspecified: Secondary | ICD-10-CM | POA: Diagnosis not present

## 2023-06-21 DIAGNOSIS — G8929 Other chronic pain: Secondary | ICD-10-CM | POA: Diagnosis not present

## 2023-06-21 DIAGNOSIS — J4520 Mild intermittent asthma, uncomplicated: Secondary | ICD-10-CM | POA: Diagnosis not present

## 2023-06-21 DIAGNOSIS — M25512 Pain in left shoulder: Secondary | ICD-10-CM

## 2023-06-21 NOTE — Progress Notes (Signed)
    SUBJECTIVE:   CHIEF COMPLAINT / HPI:   L Shoulder Continues with significant pain and decreased range of motion despite Physical Therapy.  No distal weakness  Diabetes No low blood sugars.  Knows her medications and brings in the bottles   Hypertension Took all her medications around 7 AM today     OBJECTIVE:   BP 138/64   Pulse 60   Ht 5\' 2"  (1.575 m)   Wt 217 lb 9.6 oz (98.7 kg)   SpO2 100%   BMI 39.80 kg/m   L shoulder - decreased range of motion especially with abduction and internal rotation  Walks using walker  ASSESSMENT/PLAN:   Primary osteoarthritis involving multiple joints -     Ambulatory referral to Orthopedic Surgery     Patient Instructions  Good to see you today - Thank you for coming in  Things we discussed today:  Shoulder and Knee Pain I have put in a referral to Orthopedics.  They should call you to schedule an appointment.  This could appear as an unknown number on your phone.  If you have not heard from them in 2 weeks please let me know.   Try the gabapentin at ONE tab at night.  Continue tylenol as needed for pain  Please always bring your medication bottles  Come back to see me in 1-2 months for an A1c    Carney Living, MD [facility]

## 2023-06-21 NOTE — Assessment & Plan Note (Signed)
Reports doing well with Breo.

## 2023-06-21 NOTE — Assessment & Plan Note (Signed)
Lab Results  Component Value Date   CREATININE 1.20 (H) 12/30/2022   CREATININE 1.32 (H) 12/07/2022   CREATININE 1.16 (H) 12/30/2021    Creatinine is stable

## 2023-06-21 NOTE — Assessment & Plan Note (Signed)
Not improving.  Will refer to ortho for xray (has history of breast cancer) and likely steroid injection.  Also has bilateral knee replacements and is having more trouble with pain and walking

## 2023-06-21 NOTE — Patient Instructions (Addendum)
Good to see you today - Thank you for coming in  Things we discussed today:  Shoulder and Knee Pain I have put in a referral to Orthopedics.  They should call you to schedule an appointment.  This could appear as an unknown number on your phone.  If you have not heard from them in 2 weeks please let me know.   Try the gabapentin at ONE tab at night.  Continue tylenol as needed for pain  Please always bring your medication bottles  Come back to see me in 1-2 months for an A1c

## 2023-06-21 NOTE — Telephone Encounter (Signed)
Lmom for pt to call back to set up appt to pick up diabetic shoes  

## 2023-06-24 ENCOUNTER — Ambulatory Visit (INDEPENDENT_AMBULATORY_CARE_PROVIDER_SITE_OTHER): Payer: 59

## 2023-06-24 DIAGNOSIS — M2011 Hallux valgus (acquired), right foot: Secondary | ICD-10-CM | POA: Diagnosis not present

## 2023-06-24 DIAGNOSIS — E1169 Type 2 diabetes mellitus with other specified complication: Secondary | ICD-10-CM

## 2023-06-24 DIAGNOSIS — M2012 Hallux valgus (acquired), left foot: Secondary | ICD-10-CM | POA: Diagnosis not present

## 2023-06-24 DIAGNOSIS — M2010 Hallux valgus (acquired), unspecified foot: Secondary | ICD-10-CM

## 2023-06-24 DIAGNOSIS — E1140 Type 2 diabetes mellitus with diabetic neuropathy, unspecified: Secondary | ICD-10-CM

## 2023-06-24 DIAGNOSIS — E11610 Type 2 diabetes mellitus with diabetic neuropathic arthropathy: Secondary | ICD-10-CM

## 2023-06-24 NOTE — Progress Notes (Signed)
Patient presents today to pick up diabetic shoes and insoles.  Patient was dispensed 1 pair of diabetic shoes and 3 pairs of foam casted diabetic insoles. Fit was snug I advised patient on a different pair of shoes but she wanted to try herself and see if they stretch Instructions for break-in and wear was reviewed and a copy was given to the patient.   Re-appointment for regularly scheduled diabetic foot care visits or if they should experience any trouble with the shoes or insoles.  Addison Bailey Cped, CFo, CFm

## 2023-06-28 DIAGNOSIS — H401122 Primary open-angle glaucoma, left eye, moderate stage: Secondary | ICD-10-CM | POA: Diagnosis not present

## 2023-06-28 DIAGNOSIS — E119 Type 2 diabetes mellitus without complications: Secondary | ICD-10-CM | POA: Diagnosis not present

## 2023-06-28 DIAGNOSIS — H401113 Primary open-angle glaucoma, right eye, severe stage: Secondary | ICD-10-CM | POA: Diagnosis not present

## 2023-07-01 ENCOUNTER — Other Ambulatory Visit: Payer: Self-pay | Admitting: Family Medicine

## 2023-07-07 ENCOUNTER — Other Ambulatory Visit: Payer: 59

## 2023-07-08 ENCOUNTER — Ambulatory Visit (INDEPENDENT_AMBULATORY_CARE_PROVIDER_SITE_OTHER): Payer: 59

## 2023-07-08 VITALS — Ht 62.0 in | Wt 217.0 lb

## 2023-07-08 DIAGNOSIS — Z0000 Encounter for general adult medical examination without abnormal findings: Secondary | ICD-10-CM | POA: Diagnosis not present

## 2023-07-09 NOTE — Progress Notes (Cosign Needed Addendum)
Subjective:   [name] is a 72 y.o. female who presents for Medicare Annual (Subsequent) preventive examination.  Visit Complete: Virtual  I connected with  [name] on 07/08/23 by a audio enabled telemedicine application and verified that I am speaking with the correct person using two identifiers.  Patient Location: Home  Provider Location: Home Office  I discussed the limitations of evaluation and management by telemedicine. The patient expressed understanding and agreed to proceed.  Vital Signs: Unable to obtain new vitals due to this being a telehealth visit.  Review of Systems     Cardiac Risk Factors include: advanced age (>71men, >107 women);diabetes mellitus;dyslipidemia;hypertension     Objective:    Today's Vitals   07/09/23 1727  Weight: 217 lb (98.4 kg)  Height: 5\' 2"  (1.575 m)   Body mass index is 39.69 kg/m.     07/09/2023    5:45 PM 07/09/2023    5:38 PM 04/26/2023    9:16 AM 02/08/2023    9:06 AM 08/10/2022    9:14 AM 05/05/2022    9:01 AM 02/01/2022    9:00 AM  Advanced Directives  Does Patient Have a Medical Advance Directive? Yes No No No No No No  Type of Estate agent of [place];Living will        Does patient want to make changes to medical advance directive? No - Patient declined        Copy of Healthcare Power of Attorney in Chart? Yes - validated most recent copy scanned in chart (See row information)        Would patient like information on creating a medical advance directive? No - Patient declined Yes (MAU/Ambulatory/Procedural Areas - Information given) No - Patient declined  No - Patient declined No - Patient declined No - Patient declined    Current Medications (verified) Outpatient Encounter Medications as of 07/08/2023  Medication Sig   acetaminophen (TYLENOL) 650 MG CR tablet Take 650 mg by mouth every 8 (eight) hours as needed for pain.   allopurinol (ZYLOPRIM) 100 MG tablet TAKE 1 TABLET BY MOUTH DAILY    atorvastatin (LIPITOR) 40 MG tablet TAKE 1 TABLET BY MOUTH DAILY   blood glucose meter kit and supplies Dispense based on patient and insurance preference.Use to check blood sugar in the morning. (FOR ICD-10 E10.9, E11.9).  [name] is what she has been using.   Blood Glucose Monitoring Suppl (ONETOUCH VERIO) w/Device KIT Use to check blood glucose every morning. E11.9   CARESTART COVID-19 HOME TEST KIT See admin instructions.   colchicine 0.6 MG tablet TAKE 1 TABLET BY MOUTH DAILY   dorzolamide (TRUSOPT) 2 % ophthalmic solution 1 drop daily.   dorzolamide-timolol (COSOPT) 22.3-6.8 MG/ML ophthalmic solution Place 1 drop into both eyes 2 (two) times daily.   empagliflozin (JARDIANCE) 25 MG TABS tablet Take 1 tablet (25 mg total) by mouth daily.   fluticasone furoate-vilanterol (BREO ELLIPTA) 200-25 MCG/ACT AEPB Inhale 1 puff into the lungs daily.   gabapentin (NEURONTIN) 100 MG capsule TAKE 1 CAPSULE BY MOUTH AT  BEDTIME   glucose blood (ONETOUCH ULTRA) test strip USE TO CHECK BLOOD SUGAR EVERY MORNING   glucose blood (ONETOUCH VERIO) test strip Use as instructed   glucose blood (ONETOUCH VERIO) test strip CHECK BLOOD SUGAR IN THE  MORNING AS DIRECTED   Insulin Pen Needle (EASY COMFORT PEN NEEDLES) 31G X 8 MM MISC USE TO INJECT INSULIN EVERY DAY AS DIRECTED   Lancet Devices [facility] [employer]  PLUS LANCING) MISC USE ONCE DAILY   Lancets (ONETOUCH DELICA PLUS LANCET33G) MISC USE DAILY AS NEEDED.   LANTUS SOLOSTAR 100 UNIT/ML Solostar Pen INJECT 32 UNITS EACH MORNING   latanoprost (XALATAN) 0.005 % ophthalmic solution Place 1 drop into both eyes at bedtime.    losartan (COZAAR) 100 MG tablet TAKE 1 TABLET BY MOUTH AT  BEDTIME   metoprolol succinate (TOPROL-XL) 25 MG 24 hr tablet TAKE 1 TABLET BY MOUTH ONCE  DAILY   Miconazole Nitrate (LOTRIMIN AF) 2 % AERO Spray between toes once daily for 4 weeks.   ONETOUCH VERIO test strip CHECK BLOOD SUGAR IN THE MORNING AS DIRECTED   OZEMPIC, 2 MG/DOSE,  8 MG/3ML SOPN INJECT SUBCUTANEOUSLY 2 MG ONCE  A WEEK   spironolactone (ALDACTONE) 25 MG tablet TAKE 1 TABLET BY MOUTH DAILY   No facility-administered encounter medications on file as of 07/08/2023.    Allergies (verified) Patient has no known allergies.   History: Past Medical History:  Diagnosis Date   Anemia    Arthritis    Asthma    Blood transfusion without reported diagnosis    Patients believes 2015 when she overdosed on "medications"    Breast cancer (HCC)    Breast cancer of lower-outer quadrant of right female breast (HCC) 02/13/2015   Cataract    CHF, acute (HCC) 05/03/2012   Depression    Diabetes mellitus    Eczema    Fatty liver    Fatty infiltration of liver noted on 03/2012 CT scan   Fibromyalgia    Glaucoma    Heart disease    History of kidney stones    w/ hx of hydronephrosis - followed by [facility]   HIV nonspecific serology    2006: indeterminate HIV blood test, seen by ID, felt secondary to cross reacting antibodies with no further workup felt necessary at that time    Hyperlipidemia    Hypertension    Obesity    Personal history of radiation therapy 2016   right   Radiation 05/07/15-06/23/15   Right Breast   Past Surgical History:  Procedure Laterality Date   BREAST BIOPSY Left 02/10/2015   malignant    BREAST LUMPECTOMY Right 03/21/2015   CHOLECYSTECTOMY  2003   CYSTOSCOPY W/ LITHOLAPAXY / [facility]     JOINT REPLACEMENT     bilateral knee replacement   LEFT AND RIGHT HEART CATHETERIZATION WITH CORONARY ANGIOGRAM N/A 05/02/2012   Procedure: LEFT AND RIGHT HEART CATHETERIZATION WITH CORONARY ANGIOGRAM;  Surgeon: Kathleene Hazel, MD;  Location: [facility] CATH LAB;  Service: Cardiovascular;  Laterality: N/A;   RADIOACTIVE SEED GUIDED PARTIAL MASTECTOMY WITH AXILLARY SENTINEL LYMPH NODE BIOPSY Right 03/21/2015   Procedure: RIGHT  PARTIAL MASTECTOMY WITH RADIOACTIVE SEED LOCALIZATION RIGHT  AXILLARY SENTINEL LYMPH NODE BIOPSY;  Surgeon: Claud Kelp,  MD;  Location: [facility];  Service: General;  Laterality: Right;   REPLACEMENT TOTAL KNEE BILATERAL  2005 &2006   VASCULAR SURGERY Right 03/15/2013   Ultrasound guided sclerotherapy   Family History  Problem Relation Age of Onset   Diabetes Mother    Stroke Mother    Heart disease Father    Anemia Father    Esophageal cancer Sister    Cancer Sister 17       nose cancer    Diabetes Sister        2 sisters type 1 diabetes    Kidney disease Sister        waiting for a kidney  transplant- on dialysis   Diabetes Brother        Type 2 diabetic    Diabetes Son        Type 2    Hypertension Son    Cancer Cousin 30       ovarian cancer    Cancer Cousin 64       ovarian cancer    Colon cancer Neg Hx    Rectal cancer Neg Hx    Stomach cancer Neg Hx    Social History   Socioeconomic History   Marital status: Widowed    Spouse name: [name]   Number of children: 3   Years of education: 14   Highest education level: Associate degree: occupational, Scientist, product/process development, or vocational program  Occupational History   Occupation: retired-CNA, Equities trader: RETIRED   Occupation: Engineer, site  Tobacco Use   Smoking status: Never   Smokeless tobacco: Never  Vaping Use   Vaping status: Never Used  Substance and Sexual Activity   Alcohol use: No    Alcohol/week: 0.0 standard drinks of alcohol   Drug use: No   Sexual activity: Not Currently    Birth control/protection: Post-menopausal  Other Topics Concern   Not on file  Social History Narrative   Emergency Contact: son, Dulcemaria Fan [phone]   Patient lives is single story home with daughter, [name] and grandddaughter, [name]. Ramp to front door, 3 steps in back. Home has smoke detectors, no throw rugs. No grab bars in bathroom. No pets.   Diet: Pt has a varied diet.  Reports eating 2 meals a day.    Exercise: walks daily 15-20 mins; not as much due to possible irritable bowel; does exercise at church  once weekly. Serves on [employer] at [employer] and as a Chief Strategy Officer at [employer].   Seatbelts: Pt reports wearing seatbelt when in vehicles. Does not drive.   Hobbies: word searches, church, time with family and friends, cooking, walking when she can; going out shopping.   Drinks occasional soda; drinks a lot of water and some kool-aid; does drink tea      *Update as of 04/19/2019*   Patient has not able to walk around her neighborhood, or down her street, due to COVID. Patient is eager to get back to this once she feels "safe" to leave her house.    Social Determinants of Health   Financial Resource Strain: Low Risk  (07/09/2023)   Overall Financial Resource Strain (CARDIA)    Difficulty of Paying Living Expenses: Not hard at all  Food Insecurity: No Food Insecurity (07/09/2023)   Hunger Vital Sign    Worried About Running Out of Food in the Last Year: Never true    Ran Out of Food in the Last Year: Never true  Transportation Needs: No Transportation Needs (07/09/2023)   PRAPARE - Administrator, Civil Service (Medical): No    Lack of Transportation (Non-Medical): No  Physical Activity: Insufficiently Active (07/09/2023)   Exercise Vital Sign    Days of Exercise per Week: 2 days    Minutes of Exercise per Session: 30 min  Stress: No Stress Concern Present (07/09/2023)   [employer] of Occupational Health - Occupational Stress Questionnaire    Feeling of Stress : Not at all  Social Connections: Moderately Integrated (07/09/2023)   Social Connection and Isolation Panel [NHANES]    Frequency of Communication with Friends and Family: More than three times a week  Frequency of Social Gatherings with Friends and Family: Three times a week    Attends Religious Services: More than 4 times per year    Active Member of Clubs or Organizations: Yes    Attends Banker Meetings: More than 4 times per year    Marital Status: Widowed    Tobacco Counseling Counseling  given: Not Answered   Clinical Intake:  Pre-visit preparation completed: Yes  Pain : No/denies pain     Diabetes: Yes CBG done?: No Did pt. bring in CBG monitor from home?: No  How often do you need to have someone help you when you read instructions, pamphlets, or other written materials from your doctor or pharmacy?: 1 - Never  Interpreter Needed?: No  Information entered by :: Kandis Fantasia LPN   Activities of Daily Living    07/09/2023    5:37 PM  In your present state of health, do you have any difficulty performing the following activities:  Hearing? 0  Vision? 0  Difficulty concentrating or making decisions? 0  Walking or climbing stairs? 0  Dressing or bathing? 0  Doing errands, shopping? 0  Preparing Food and eating ? N  Using the Toilet? N  In the past six months, have you accidently leaked urine? N  Do you have problems with loss of bowel control? N  Managing your Medications? N  Managing your Finances? N  Housekeeping or managing your Housekeeping? N    Patient Care Team: Carney Living, MD as PCP - General (Family Medicine) Aura Camps, MD (Ophthalmology) Barron Alvine, MD (Inactive) (Urology) Dr. Cleon Dew (Dental General Practice) Claud Kelp, MD as Consulting Physician (General Surgery) Hubbard Hartshorn, NP (Inactive) as Nurse Practitioner (Nurse Practitioner) Malachy Mood, MD as Consulting Physician (Hematology) Helane Gunther, DPM (Podiatry) Pleasant, Dennard Schaumann, RN as [facility] Care Management Chambliss, Estill Batten, MD (Family Medicine) Juanell Fairly, RN as [facility] Care Management  Indicate any recent Medical Services you may have received from other than [facility] providers in the past year (date may be approximate).     Assessment:   This is a routine wellness examination for [place].  Hearing/Vision screen Hearing Screening - Comments:: Denies hearing difficulties   Vision Screening - Comments::  Wears rx glasses - up to date with routine eye exams with [facility]   Dietary issues and exercise activities discussed:     Goals Addressed               This Visit's Progress     COMPLETED: HEMOGLOBIN A1C < 7 (pt-stated)        Patient stated she would like to see her A1C less than 7. Pt stated she is eager to start walking around her street again. Pt reported she was walking "a lot" before COVID and hopes to get back to walking once social distancing is over.         COMPLETED: I want to monitor and mange my blood sugars        Patient Goals/Self Care Activities: -Patient/Caregiver will self-administer medications as prescribed as evidenced by self-report/primary caregiver report  -Patient/Caregiver will attend all scheduled provider appointments as evidenced by clinician review of documented attendance to scheduled appointments and patient/caregiver report -Patient/Caregiver will call pharmacy for medication refills as evidenced by patient report and review of pharmacy fill history as appropriate -Patient/Caregiver will call provider office for new concerns or questions as evidenced by review of documented incoming telephone call notes and  patient report -Patient/Caregiver verbalizes understanding of plan -Patient/Caregiver will focus on medication adherence by taking medications as prescribed  -check blood sugar at prescribed times - Monitor food and fluid intake.  -Continue your diligence with your health I am so happy with your accomplishment.      Remain active and independent        Depression Screen    07/09/2023    5:35 PM 06/21/2023    8:58 AM 04/26/2023    9:15 AM 12/07/2022    9:09 AM 08/10/2022    9:13 AM 02/01/2022    9:01 AM 09/09/2021    9:27 AM  PHQ 2/9 Scores  PHQ - 2 Score 1 1 0 0 2 0 4  PHQ- 9 Score 8 8 0 4 6 4 15     Fall Risk    07/09/2023    5:37 PM 06/21/2023    8:58 AM 04/26/2023    9:15 AM 12/07/2022    9:09 AM 08/10/2022    9:13 AM  Fall Risk    Falls in the past year? 0 0 0 0 0  Number falls in past yr: 0 0 0 0 0  Injury with Fall? 0 0 0 0 0  Risk for fall due to : No Fall Risks      Follow up Falls prevention discussed;Education provided;Falls evaluation completed        MEDICARE RISK AT HOME:  Medicare Risk at Home - 07/09/23 1737     Any stairs in or around the home? No    If so, are there any without handrails? No    Home free of loose throw rugs in walkways, pet beds, electrical cords, etc? Yes    Adequate lighting in your home to reduce risk of falls? Yes    Life alert? No    Use of a cane, walker or w/c? No    Grab bars in the bathroom? Yes    Shower chair or bench in shower? No    Elevated toilet seat or a handicapped toilet? No             TIMED UP AND GO:  Was the test performed?  No    Cognitive Function:    08/29/2018   10:25 AM 05/21/2015   11:26 AM 01/18/2014   10:00 AM 04/26/2013   11:00 AM 04/03/2012   12:00 PM  MMSE - Mini Mental State Exam  Orientation to time 5 5 5 5 5   Orientation to Place 5 5 5 5 5   Registration 3 3 3 3 3   Attention/ Calculation 5 5 5 5 5   Recall 3 3 3 1 3   Language- name 2 objects 2 2 2 2 2   Language- repeat 1 1 1 1 1   Language- follow 3 step command 3 3 3 3 3   Language- read & follow direction 1 1 1 1 1   Write a sentence 1 1 1 1 1   Copy design 1 1 1 1 1   Total score 30 30 30 28 30         07/09/2023    5:37 PM 04/19/2019    4:06 PM 04/19/2019    3:29 PM 04/19/2019    2:49 PM 08/29/2018   10:26 AM  6CIT Screen  What Year? 0 points 0 points 0 points 0 points 0 points  What month? 0 points 0 points  0 points 0 points  What time? 0 points 0 points 0 points 0 points 0 points  Count back  from 20 0 points 0 points 0 points 0 points 0 points  Months in reverse 0 points 0 points  0 points 0 points  Repeat phrase 0 points 0 points 0 points 0 points 0 points  Total Score 0 points 0 points  0 points 0 points    Immunizations Immunization History  Administered Date(s)  Administered   COVID-19, mRNA, vaccine(Comirnaty)12 years and older 10/01/2022   Fluad Quad(high Dose 65+) 09/06/2022   Influenza Split 08/17/2011, 08/23/2012   Influenza Whole 09/12/2009, 12/14/2010   Influenza,inj,Quad PF,6+ Mos 08/27/2013, 09/02/2014, 08/13/2015, 09/08/2016, 10/05/2017, 08/29/2018, 08/01/2019, 08/05/2020, 08/12/2021   PFIZER(Purple Top)SARS-COV-2 Vaccination 01/25/2020, 02/19/2020, 09/21/2020   Pfizer Covid-19 Vaccine Bivalent Booster 52yrs & up 09/09/2021   Pneumococcal Conjugate-13 05/21/2015   Pneumococcal Polysaccharide-23 04/30/2012, 10/05/2017   Td 07/14/2009   Tdap 09/23/2018   Zoster, Live 06/02/2012    TDAP status: Due, Education has been provided regarding the importance of this vaccine. Advised may receive this vaccine at local pharmacy or Health Dept. Aware to provide a copy of the vaccination record if obtained from local pharmacy or Health Dept. Verbalized acceptance and understanding.  Flu Vaccine status: Due, Education has been provided regarding the importance of this vaccine. Advised may receive this vaccine at local pharmacy or Health Dept. Aware to provide a copy of the vaccination record if obtained from local pharmacy or Health Dept. Verbalized acceptance and understanding.  Pneumococcal vaccine status: Up to date  Covid-19 vaccine status: Information provided on how to obtain vaccines.   Qualifies for Shingles Vaccine? Yes   Zostavax completed Yes   Shingrix Completed?: No.    Education has been provided regarding the importance of this vaccine. Patient has been advised to call insurance company to determine out of pocket expense if they have not yet received this vaccine. Advised may also receive vaccine at local pharmacy or Health Dept. Verbalized acceptance and understanding.  Screening Tests Health Maintenance  Topic Date Due   Zoster Vaccines- Shingrix (1 of 2) 02/08/1970   COVID-19 Vaccine (6 - 2023-24 season) 11/26/2022   INFLUENZA  VACCINE  06/30/2023   OPHTHALMOLOGY EXAM  07/17/2023   FOOT EXAM  08/07/2023   HEMOGLOBIN A1C  10/27/2023   Diabetic kidney evaluation - eGFR measurement  12/31/2023   Diabetic kidney evaluation - Urine ACR  04/25/2024   Medicare Annual Wellness (AWV)  07/07/2024   Colonoscopy  11/03/2024   MAMMOGRAM  03/10/2025   Pneumonia Vaccine 29+ Years old  Completed   DEXA SCAN  Completed   Hepatitis C Screening  Completed   HPV VACCINES  Aged Out   DTaP/Tdap/Td  Discontinued    Health Maintenance  Health Maintenance Due  Topic Date Due   Zoster Vaccines- Shingrix (1 of 2) 02/08/1970   COVID-19 Vaccine (6 - 2023-24 season) 11/26/2022   INFLUENZA VACCINE  06/30/2023    Colorectal cancer screening: Type of screening: Colonoscopy. Completed 11/03/21. Repeat every 3 years  Mammogram status: Completed 03/11/23. Repeat every year  Bone Density status: Completed 05/11/22. Results reflect: Bone density results: OSTEOPENIA. Repeat every 2 years.  Lung Cancer Screening: (Low Dose CT Chest recommended if Age 5-80 years, 20 pack-year currently smoking OR have quit w/in 15years.) does not qualify.   Lung Cancer Screening Referral: n/a  Additional Screening:  Hepatitis C Screening: does qualify; Completed 08/13/15  Vision Screening: Recommended annual ophthalmology exams for early detection of glaucoma and other disorders of the eye. Is the patient up to date with their annual eye exam?  Yes  Who is the provider or what is the name of the office in which the patient attends annual eye exams? [facility] If pt is not established with a provider, would they like to be referred to a provider to establish care? No .   Dental Screening: Recommended annual dental exams for proper oral hygiene  Diabetic Foot Exam: Diabetic Foot Exam: Completed 08/06/22  Community Resource Referral / Chronic Care Management: CRR required this visit?  No   CCM required this visit?  Appt scheduled with PCP      Plan:     I have personally reviewed and noted the following in the patient's chart:   Medical and social history Use of alcohol, tobacco or illicit drugs  Current medications and supplements including opioid prescriptions. Patient is not currently taking opioid prescriptions. Functional ability and status Nutritional status Physical activity Advanced directives List of other physicians Hospitalizations, surgeries, and ER visits in previous 12 months Vitals Screenings to include cognitive, depression, and falls Referrals and appointments  In addition, I have reviewed and discussed with patient certain preventive protocols, quality metrics, and best practice recommendations. A written personalized care plan for preventive services as well as general preventive health recommendations were provided to patient.     Kandis Fantasia [place], [place]   03/07/8118   After Visit Summary: (MyChart) Due to this being a telephonic visit, the after visit summary with patients personalized plan was offered to patient via MyChart   Nurse Notes: No concerns at this time

## 2023-07-09 NOTE — Patient Instructions (Signed)
[name] , Thank you for taking time to come for your Medicare Wellness Visit. I appreciate your ongoing commitment to your health goals. Please review the following plan we discussed and let me know if I can assist you in the future.   Referrals/Orders/Follow-Ups/Clinician Recommendations: Aim for 30 minutes of exercise or brisk walking, 6-8 glasses of water, and 5 servings of fruits and vegetables each day.  This is a list of the screening recommended for you and due dates:  Health Maintenance  Topic Date Due   Zoster (Shingles) Vaccine (1 of 2) [birth year]/1968   COVID-19 Vaccine (6 - 2023-24 season) 11/26/2022   Flu Shot  06/30/2023   Eye exam for diabetics  07/17/2023   Complete foot exam   08/07/2023   Hemoglobin A1C  10/27/2023   Yearly kidney function blood test for diabetes  12/31/2023   Yearly kidney health urinalysis for diabetes  04/25/2024   Medicare Annual Wellness Visit  07/07/2024   Colon Cancer Screening  11/03/2024   Mammogram  03/10/2025   Pneumonia Vaccine  Completed   DEXA scan (bone density measurement)  Completed   Hepatitis C Screening  Completed   HPV Vaccine  Aged Out   DTaP/Tdap/Td vaccine  Discontinued    Advanced directives: (In Chart) A copy of your advanced directives are scanned into your chart should your provider ever need it.  Next Medicare Annual Wellness Visit scheduled for next year: Yes  Preventive Care 28 Years and Older, Female Preventive care refers to lifestyle choices and visits with your health care provider that can promote health and wellness. What does preventive care include? A yearly physical exam. This is also called an annual well check. Dental exams once or twice a year. Routine eye exams. Ask your health care provider how often you should have your eyes checked. Personal lifestyle choices, including: Daily care of your teeth and gums. Regular physical activity. Eating a healthy diet. Avoiding tobacco and drug use. Limiting  alcohol use. Practicing safe sex. Taking low-dose aspirin every day. Taking vitamin and mineral supplements as recommended by your health care provider. What happens during an annual well check? The services and screenings done by your health care provider during your annual well check will depend on your age, overall health, lifestyle risk factors, and family history of disease. Counseling  Your health care provider may ask you questions about your: Alcohol use. Tobacco use. Drug use. Emotional well-being. Home and relationship well-being. Sexual activity. Eating habits. History of falls. Memory and ability to understand (cognition). Work and work Astronomer. Reproductive health. Screening  You may have the following tests or measurements: Height, weight, and BMI. Blood pressure. Lipid and cholesterol levels. These may be checked every 5 years, or more frequently if you are over 40 years old. Skin check. Lung cancer screening. You may have this screening every year starting at age 55 if you have a 30-pack-year history of smoking and currently smoke or have quit within the past 15 years. Fecal occult blood test (FOBT) of the stool. You may have this test every year starting at age 66. Flexible sigmoidoscopy or colonoscopy. You may have a sigmoidoscopy every 5 years or a colonoscopy every 10 years starting at age 61. Hepatitis C blood test. Hepatitis B blood test. Sexually transmitted disease (STD) testing. Diabetes screening. This is done by checking your blood sugar (glucose) after you have not eaten for a while (fasting). You may have this done every 1-3 years. Bone density scan. This is  done to screen for osteoporosis. You may have this done starting at age 43. Mammogram. This may be done every 1-2 years. Talk to your health care provider about how often you should have regular mammograms. Talk with your health care provider about your test results, treatment options, and if  necessary, the need for more tests. Vaccines  Your health care provider may recommend certain vaccines, such as: Influenza vaccine. This is recommended every year. Tetanus, diphtheria, and acellular pertussis (Tdap, Td) vaccine. You may need a Td booster every 10 years. Zoster vaccine. You may need this after age 42. Pneumococcal 13-valent conjugate (PCV13) vaccine. One dose is recommended after age 4. Pneumococcal polysaccharide (PPSV23) vaccine. One dose is recommended after age 55. Talk to your health care provider about which screenings and vaccines you need and how often you need them. This information is not intended to replace advice given to you by your health care provider. Make sure you discuss any questions you have with your health care provider. Document Released: 12/12/2015 Document Revised: 08/04/2016 Document Reviewed: 09/16/2015 Elsevier Interactive Patient Education  2017 [employer].  Fall Prevention in the Home Falls can cause injuries. They can happen to people of all ages. There are many things you can do to make your home safe and to help prevent falls. What can I do on the outside of my home? Regularly fix the edges of walkways and driveways and fix any cracks. Remove anything that might make you trip as you walk through a door, such as a raised step or threshold. Trim any bushes or trees on the path to your home. Use bright outdoor lighting. Clear any walking paths of anything that might make someone trip, such as rocks or tools. Regularly check to see if handrails are loose or broken. Make sure that both sides of any steps have handrails. Any raised decks and porches should have guardrails on the edges. Have any leaves, snow, or ice cleared regularly. Use sand or salt on walking paths during winter. Clean up any spills in your garage right away. This includes oil or grease spills. What can I do in the bathroom? Use night lights. Install grab bars by the toilet  and in the tub and shower. Do not use towel bars as grab bars. Use non-skid mats or decals in the tub or shower. If you need to sit down in the shower, use a plastic, non-slip stool. Keep the floor dry. Clean up any water that spills on the floor as soon as it happens. Remove soap buildup in the tub or shower regularly. Attach bath mats securely with double-sided non-slip rug tape. Do not have throw rugs and other things on the floor that can make you trip. What can I do in the bedroom? Use night lights. Make sure that you have a light by your bed that is easy to reach. Do not use any sheets or blankets that are too big for your bed. They should not hang down onto the floor. Have a firm chair that has side arms. You can use this for support while you get dressed. Do not have throw rugs and other things on the floor that can make you trip. What can I do in the kitchen? Clean up any spills right away. Avoid walking on wet floors. Keep items that you use a lot in easy-to-reach places. If you need to reach something above you, use a strong step stool that has a grab bar. Keep electrical cords out of  the way. Do not use floor polish or wax that makes floors slippery. If you must use wax, use non-skid floor wax. Do not have throw rugs and other things on the floor that can make you trip. What can I do with my stairs? Do not leave any items on the stairs. Make sure that there are handrails on both sides of the stairs and use them. Fix handrails that are broken or loose. Make sure that handrails are as long as the stairways. Check any carpeting to make sure that it is firmly attached to the stairs. Fix any carpet that is loose or worn. Avoid having throw rugs at the top or bottom of the stairs. If you do have throw rugs, attach them to the floor with carpet tape. Make sure that you have a light switch at the top of the stairs and the bottom of the stairs. If you do not have them, ask someone to add  them for you. What else can I do to help prevent falls? Wear shoes that: Do not have high heels. Have rubber bottoms. Are comfortable and fit you well. Are closed at the toe. Do not wear sandals. If you use a stepladder: Make sure that it is fully opened. Do not climb a closed stepladder. Make sure that both sides of the stepladder are locked into place. Ask someone to hold it for you, if possible. Clearly mark and make sure that you can see: Any grab bars or handrails. First and last steps. Where the edge of each step is. Use tools that help you move around (mobility aids) if they are needed. These include: Canes. Walkers. Scooters. Crutches. Turn on the lights when you go into a dark area. Replace any light bulbs as soon as they burn out. Set up your furniture so you have a clear path. Avoid moving your furniture around. If any of your floors are uneven, fix them. If there are any pets around you, be aware of where they are. Review your medicines with your doctor. Some medicines can make you feel dizzy. This can increase your chance of falling. Ask your doctor what other things that you can do to help prevent falls. This information is not intended to replace advice given to you by your health care provider. Make sure you discuss any questions you have with your health care provider. Document Released: 09/11/2009 Document Revised: 04/22/2016 Document Reviewed: 12/20/2014 Elsevier Interactive Patient Education  2017 [employer].

## 2023-07-12 ENCOUNTER — Ambulatory Visit: Payer: 59 | Admitting: Family Medicine

## 2023-07-12 ENCOUNTER — Ambulatory Visit: Payer: 59

## 2023-07-12 NOTE — Progress Notes (Signed)
Patient was here to return shoes we are going to re-order same shoe in larger sz return complete and new shoe on order patient will return when shoes are in for fitting inserts are here in cupboard  Addison Bailey CPed, CFo, CFm

## 2023-07-15 NOTE — Telephone Encounter (Signed)
Lmom for pt to call back to pick up exchange shoes

## 2023-07-19 ENCOUNTER — Other Ambulatory Visit: Payer: Self-pay | Admitting: Family Medicine

## 2023-07-19 DIAGNOSIS — M1000 Idiopathic gout, unspecified site: Secondary | ICD-10-CM

## 2023-07-21 ENCOUNTER — Other Ambulatory Visit (INDEPENDENT_AMBULATORY_CARE_PROVIDER_SITE_OTHER): Payer: 59

## 2023-07-21 ENCOUNTER — Other Ambulatory Visit: Payer: Self-pay

## 2023-07-21 ENCOUNTER — Ambulatory Visit (INDEPENDENT_AMBULATORY_CARE_PROVIDER_SITE_OTHER): Payer: 59 | Admitting: Orthopaedic Surgery

## 2023-07-21 ENCOUNTER — Encounter: Payer: Self-pay | Admitting: Orthopaedic Surgery

## 2023-07-21 DIAGNOSIS — M25562 Pain in left knee: Secondary | ICD-10-CM

## 2023-07-21 DIAGNOSIS — M25512 Pain in left shoulder: Secondary | ICD-10-CM | POA: Diagnosis not present

## 2023-07-21 DIAGNOSIS — G8929 Other chronic pain: Secondary | ICD-10-CM

## 2023-07-21 NOTE — Progress Notes (Addendum)
Office Visit Note   Patient: [name]           Date of Birth: 15-Apr-1951           MRN: [id number] Visit Date: 07/21/2023              Requested by: Carney Living, MD [address] [place],  [place] [address] PCP: Carney Living, MD   Assessment & Plan: Visit Diagnoses:  1. Chronic left shoulder pain   2. Chronic pain of left knee     Plan: Lale is a 72 year old female with chronic left shoulder and left knee pain.  For the shoulder she has bone-on-bone glenohumeral osteoarthritis.  We will make a referral to Dr. Steward Drone or August Saucer for surgical consultation.  If she is not interested in cortisone injections at this point as they have not worked in the past.  For the knee I do not see any problems with the implant.  I think her issue is lack of quad strength relative to her weight.  I will make a referral for physical therapy.  Follow-up as needed.  Follow-Up Instructions: No follow-ups on file.   Orders:  Orders Placed This Encounter  Procedures   XR Shoulder Left   XR KNEE 3 VIEW LEFT   Ambulatory referral to Physical Therapy   Ambulatory referral to Orthopedic Surgery   No orders of the defined types were placed in this encounter.     Procedures: No procedures performed   Clinical Data: No additional findings.   Subjective: Chief Complaint  Patient presents with   Left Shoulder - Pain   Left Knee - Pain    HPI [name] comes in today for evaluation of severe left shoulder pain and left knee pain.  Denies any injuries or recent changes in activity.  She is status post left total knee replacement many years ago.  She denies any radicular symptoms. Review of Systems  Constitutional: Negative.   HENT: Negative.    Eyes: Negative.   Respiratory: Negative.    Cardiovascular: Negative.   Endocrine: Negative.   Musculoskeletal: Negative.   Neurological: Negative.   Hematological: Negative.   Psychiatric/Behavioral: Negative.    All  other systems reviewed and are negative.    Objective: Vital Signs: There were no vitals taken for this visit.  Physical Exam Vitals and nursing note reviewed.  Constitutional:      Appearance: She is well-developed.  HENT:     Head: Atraumatic.     Nose: Nose normal.  Eyes:     Extraocular Movements: Extraocular movements intact.  Cardiovascular:     Pulses: Normal pulses.  Pulmonary:     Effort: Pulmonary effort is normal.  Abdominal:     Palpations: Abdomen is soft.  Musculoskeletal:     Cervical back: Neck supple.  Skin:    General: Skin is warm.     Capillary Refill: Capillary refill takes less than 2 seconds.  Neurological:     Mental Status: She is alert. Mental status is at baseline.  Psychiatric:        Behavior: Behavior normal.        Thought Content: Thought content normal.        Judgment: Judgment normal.     Ortho Exam Examination of the left shoulder shows very limited range of motion with severe pain. Examination left knee shows fully healed surgical scar.  Collaterals are stable.  No joint effusion.  No bony tenderness.  She has  pain around the retinaculum. Specialty Comments:  No specialty comments available.  Imaging: XR Shoulder Left  Result Date: 07/21/2023 X-rays demonstrate bone-on-bone joint space narrowing of the glenohumeral joint  XR KNEE 3 VIEW LEFT  Result Date: 07/21/2023 X-rays demonstrate a total knee replacement with tibial baseplate in slight varus alignment.  No evidence of subsidence or loosening.    PMFS History: Patient Active Problem List   Diagnosis Date Noted   Renal disease due to diabetes mellitus (HCC) 04/28/2023   Left shoulder pain 09/06/2022   Left breast mass 11/11/2021   Tinea pedis 04/09/2020   Spinal stenosis of lumbar region without neurogenic claudication 02/08/2019   Gout 05/16/2018   Gait instability 01/03/2018   Tremor of both hands    Stuttering    Essential hypertension    Hyperlipidemia  08/13/2015   Breast cancer of lower-outer quadrant of right female breast (HCC) 02/13/2015   Type 2 diabetes mellitus with diabetic foot deformity (HCC) 01/01/2014   Chronic combined systolic and diastolic heart failure (HCC) 02/26/2013   OBESITY 02/05/2009   Asthma, intermittent 02/08/2008   Neuropathy due to secondary diabetes (HCC) 05/18/2007   NEPHROLITHIASIS 01/26/2007   DJD (degenerative joint disease), multiple sites 01/26/2007   Past Medical History:  Diagnosis Date   Anemia    Arthritis    Asthma    Blood transfusion without reported diagnosis    Patients believes 2015 when she overdosed on "medications"    Breast cancer (HCC)    Breast cancer of lower-outer quadrant of right female breast (HCC) 02/13/2015   Cataract    CHF, acute (HCC) 05/03/2012   Depression    Diabetes mellitus    Eczema    Fatty liver    Fatty infiltration of liver noted on 03/2012 CT scan   Fibromyalgia    Glaucoma    Heart disease    History of kidney stones    w/ hx of hydronephrosis - followed by [facility]   HIV nonspecific serology    2006: indeterminate HIV blood test, seen by ID, felt secondary to cross reacting antibodies with no further workup felt necessary at that time    Hyperlipidemia    Hypertension    Obesity    Personal history of radiation therapy 2016   right   Radiation 05/07/15-06/23/15   Right Breast    Family History  Problem Relation Age of Onset   Diabetes Mother    Stroke Mother    Heart disease Father    Anemia Father    Esophageal cancer Sister    Cancer Sister 17       nose cancer    Diabetes Sister        2 sisters type 1 diabetes    Kidney disease Sister        waiting for a kidney transplant- on dialysis   Diabetes Brother        Type 2 diabetic    Diabetes Son        Type 2    Hypertension Son    Cancer Cousin 30       ovarian cancer    Cancer Cousin 49       ovarian cancer    Colon cancer Neg Hx    Rectal cancer Neg Hx    Stomach cancer  Neg Hx     Past Surgical History:  Procedure Laterality Date   BREAST BIOPSY Left 02/10/2015   malignant    BREAST LUMPECTOMY Right 03/21/2015  CHOLECYSTECTOMY  2003   CYSTOSCOPY W/ LITHOLAPAXY / [facility]     JOINT REPLACEMENT     bilateral knee replacement   LEFT AND RIGHT HEART CATHETERIZATION WITH CORONARY ANGIOGRAM N/A 05/02/2012   Procedure: LEFT AND RIGHT HEART CATHETERIZATION WITH CORONARY ANGIOGRAM;  Surgeon: Kathleene Hazel, MD;  Location: [facility] CATH LAB;  Service: Cardiovascular;  Laterality: N/A;   RADIOACTIVE SEED GUIDED PARTIAL MASTECTOMY WITH AXILLARY SENTINEL LYMPH NODE BIOPSY Right 03/21/2015   Procedure: RIGHT  PARTIAL MASTECTOMY WITH RADIOACTIVE SEED LOCALIZATION RIGHT  AXILLARY SENTINEL LYMPH NODE BIOPSY;  Surgeon: Claud Kelp, MD;  Location: [facility];  Service: General;  Laterality: Right;   REPLACEMENT TOTAL KNEE BILATERAL  2005 &2006   VASCULAR SURGERY Right 03/15/2013   Ultrasound guided sclerotherapy   Social History   Occupational History   Occupation: retired-CNA, Equities trader: RETIRED   Occupation: Engineer, site  Tobacco Use   Smoking status: Never   Smokeless tobacco: Never  Vaping Use   Vaping status: Never Used  Substance and Sexual Activity   Alcohol use: No    Alcohol/week: 0.0 standard drinks of alcohol   Drug use: No   Sexual activity: Not Currently    Birth control/protection: Post-menopausal

## 2023-07-22 ENCOUNTER — Other Ambulatory Visit: Payer: Self-pay

## 2023-07-25 ENCOUNTER — Ambulatory Visit: Payer: 59

## 2023-07-25 MED ORDER — FLUTICASONE FUROATE-VILANTEROL 200-25 MCG/ACT IN AEPB: 1.0000 | INHALATION_SPRAY | Freq: Every day | RESPIRATORY_TRACT | 3 refills | Status: DC

## 2023-07-25 NOTE — Progress Notes (Signed)
Patient returned for PU of re-ordered DM shoes  Shoes fit well and fit well with inserts  Patient is also happy with fit, patient will break in at home and will call office if any problems arise  Addison Bailey Cped, CFo, CFm

## 2023-07-27 ENCOUNTER — Ambulatory Visit: Payer: Self-pay

## 2023-07-27 NOTE — Patient Instructions (Signed)
Visit Information  Thank you for taking time to visit with me today. Please don't hesitate to contact me if I can be of assistance to you.   Following are the goals we discussed today:   Goals Addressed               This Visit's Progress     Diabetes Patient self management (pt-stated)        Patient Goals/Self-Care Activities:  Take medications as prescribed   Attend all scheduled provider appointments Call pharmacy for medication refills 3-7 days in advance of running out of medications Call provider office for new concerns or questions   Lab Results  Component Value Date   HGBA1C 7.3 (A) 04/26/2023    Reviewed medications with patient and discussed importance of medication adherence;        Counseled on importance of regular laboratory monitoring as prescribed;        Discussed plans with patient for ongoing care management follow up and provided patient with direct contact information for care management team;      Reviewed scheduled/upcoming provider appointments including       Review of patient status, including review of consultants reports, relevant laboratory and other test results, and medications completed;                Our next appointment is by telephone on 08/26/23 at 930 am  Please call the care guide team at [phone] if you need to cancel or reschedule your appointment.   If you are experiencing a Mental Health or Behavioral Health Crisis or need someone to talk to, please call 1-800-273-TALK (toll free, 24 hour hotline)  Patient verbalizes understanding of instructions and care plan provided today and agrees to view in MyChart. Active MyChart status and patient understanding of how to access instructions and care plan via MyChart confirmed with patient.     Juanell Fairly RN, BSN, [facility] [employer] [employer]designer Phone: [phone]

## 2023-07-27 NOTE — Patient Outreach (Signed)
  Care Coordination   Follow Up Visit Note   07/27/2023 Name: [name] MRN: [id number] DOB: Feb 04, 1951  [name] is a 72 y.o. year old female who sees Chambliss, Estill Batten, MD for primary care. I spoke with  [name] by phone today.  What matters to the patients health and wellness today?  [name] most recent A1C was 7.3, which is an improvement from her previous score of 7.8. Her blood sugar reading this morning was 154, and yesterday it was 171. She is scheduled to have a follow-up appointment with her primary care physician on 9/18 to monitor her A1C. Additionally, she is currently undergoing physical therapy for her left knee, and she has discontinued therapy for her shoulder due to a planned replacement.      Goals Addressed               This Visit's Progress     Diabetes Patient self management (pt-stated)        Patient Goals/Self-Care Activities:  Take medications as prescribed   Attend all scheduled provider appointments Call pharmacy for medication refills 3-7 days in advance of running out of medications Call provider office for new concerns or questions   Lab Results  Component Value Date   HGBA1C 7.3 (A) 04/26/2023    Reviewed medications with patient and discussed importance of medication adherence;        Counseled on importance of regular laboratory monitoring as prescribed;        Discussed plans with patient for ongoing care management follow up and provided patient with direct contact information for care management team;      Reviewed scheduled/upcoming provider appointments including       Review of patient status, including review of consultants reports, relevant laboratory and other test results, and medications completed;                SDOH assessments and interventions completed:  No     Care Coordination Interventions:  Yes, provided   Interventions Today    Flowsheet Row Most Recent Value  Chronic Disease    Chronic disease during today's visit Diabetes  General Interventions   General Interventions Discussed/Reviewed General Interventions Discussed, Labs  Labs Hgb A1c every 3 months  Nutrition Interventions   Nutrition Discussed/Reviewed Nutrition Discussed  Pharmacy Interventions   Pharmacy Dicussed/Reviewed Pharmacy Topics Discussed  Safety Interventions   Safety Discussed/Reviewed Safety Reviewed        Follow up plan: Follow up call scheduled for 08/26/23 930 am    Encounter Outcome:  Pt. Visit Completed   Juanell Fairly RN, BSN, [facility]   Care Coordinator Phone: [phone]

## 2023-08-08 ENCOUNTER — Other Ambulatory Visit: Payer: Self-pay

## 2023-08-08 ENCOUNTER — Other Ambulatory Visit (HOSPITAL_BASED_OUTPATIENT_CLINIC_OR_DEPARTMENT_OTHER): Payer: Self-pay

## 2023-08-08 ENCOUNTER — Ambulatory Visit (INDEPENDENT_AMBULATORY_CARE_PROVIDER_SITE_OTHER): Payer: 59 | Admitting: Orthopaedic Surgery

## 2023-08-08 DIAGNOSIS — M19012 Primary osteoarthritis, left shoulder: Secondary | ICD-10-CM

## 2023-08-08 MED ORDER — IBUPROFEN 800 MG PO TABS
800.0000 mg | ORAL_TABLET | Freq: Three times a day (TID) | ORAL | 0 refills | Status: AC
  Filled 2023-08-08: qty 30, 10d supply, fill #0

## 2023-08-08 MED ORDER — ASPIRIN 325 MG PO TBEC
325.0000 mg | DELAYED_RELEASE_TABLET | Freq: Every day | ORAL | 0 refills | Status: AC
  Filled 2023-08-08: qty 14, 14d supply, fill #0

## 2023-08-08 MED ORDER — OXYCODONE HCL 5 MG PO TABS
5.0000 mg | ORAL_TABLET | ORAL | 0 refills | Status: DC | PRN
  Filled 2023-08-08: qty 10, 2d supply, fill #0

## 2023-08-08 MED ORDER — ACETAMINOPHEN 500 MG PO TABS
500.0000 mg | ORAL_TABLET | Freq: Three times a day (TID) | ORAL | 0 refills | Status: AC
  Filled 2023-08-08: qty 30, 10d supply, fill #0

## 2023-08-08 NOTE — Progress Notes (Signed)
Chief Complaint: Left shoulder pain     History of Present Illness:    [name] is a 72 y.o. female presents today as a referral from my partner Dr. Roda Shutters for ongoing left shoulder limited range of motion and pain.  She is having a very difficult time laying directly on this side.  She is right-hand dominant but does use this arm for activities of daily living including grooming.  She has not been able to do this as result of her limited motion and pain.  She has been working with physical therapy for the last several months on the shoulder without any improvement.    Surgical History:   None  PMH/PSH/Family History/Social History/Meds/Allergies:    Past Medical History:  Diagnosis Date   Anemia    Arthritis    Asthma    Blood transfusion without reported diagnosis    Patients believes 2015 when she overdosed on "medications"    Breast cancer (HCC)    Breast cancer of lower-outer quadrant of right female breast (HCC) 02/13/2015   Cataract    CHF, acute (HCC) 05/03/2012   Depression    Diabetes mellitus    Eczema    Fatty liver    Fatty infiltration of liver noted on 03/2012 CT scan   Fibromyalgia    Glaucoma    Heart disease    History of kidney stones    w/ hx of hydronephrosis - followed by [facility]   HIV nonspecific serology    2006: indeterminate HIV blood test, seen by ID, felt secondary to cross reacting antibodies with no further workup felt necessary at that time    Hyperlipidemia    Hypertension    Obesity    Personal history of radiation therapy 2016   right   Radiation 05/07/15-06/23/15   Right Breast   Past Surgical History:  Procedure Laterality Date   BREAST BIOPSY Left 02/10/2015   malignant    BREAST LUMPECTOMY Right 03/21/2015   CHOLECYSTECTOMY  2003   CYSTOSCOPY W/ LITHOLAPAXY / EHL     JOINT REPLACEMENT     bilateral knee replacement   LEFT AND RIGHT HEART CATHETERIZATION WITH CORONARY ANGIOGRAM N/A  05/02/2012   Procedure: LEFT AND RIGHT HEART CATHETERIZATION WITH CORONARY ANGIOGRAM;  Surgeon: Kathleene Hazel, MD;  Location: [facility] CATH LAB;  Service: Cardiovascular;  Laterality: N/A;   RADIOACTIVE SEED GUIDED PARTIAL MASTECTOMY WITH AXILLARY SENTINEL LYMPH NODE BIOPSY Right 03/21/2015   Procedure: RIGHT  PARTIAL MASTECTOMY WITH RADIOACTIVE SEED LOCALIZATION RIGHT  AXILLARY SENTINEL LYMPH NODE BIOPSY;  Surgeon: Claud Kelp, MD;  Location: [facility];  Service: General;  Laterality: Right;   REPLACEMENT TOTAL KNEE BILATERAL  2005 &2006   VASCULAR SURGERY Right 03/15/2013   Ultrasound guided sclerotherapy   Social History   Socioeconomic History   Marital status: Widowed    Spouse name: [name]   Number of children: 3   Years of education: 14   Highest education level: Associate degree: occupational, Scientist, product/process development, or vocational program  Occupational History   Occupation: retired-CNA, Equities trader: RETIRED   Occupation: Engineer, site  Tobacco Use   Smoking status: Never   Smokeless tobacco: Never  Vaping Use   Vaping status: Never Used  Substance and Sexual Activity   Alcohol use: No  Alcohol/week: 0.0 standard drinks of alcohol   Drug use: No   Sexual activity: Not Currently    Birth control/protection: Post-menopausal  Other Topics Concern   Not on file  Social History Narrative   Emergency Contact: son, Starsha Schoene [phone]   Patient lives is single story home with daughter, [name] and grandddaughter, [name]. Ramp to front door, 3 steps in back. Home has smoke detectors, no throw rugs. No grab bars in bathroom. No pets.   Diet: Pt has a varied diet.  Reports eating 2 meals a day.    Exercise: walks daily 15-20 mins; not as much due to possible irritable bowel; does exercise at church once weekly. Serves on [employer] at [employer] and as a Chief Strategy Officer at [employer].   Seatbelts: Pt reports wearing seatbelt when in vehicles. Does not  drive.   Hobbies: word searches, church, time with family and friends, cooking, walking when she can; going out shopping.   Drinks occasional soda; drinks a lot of water and some kool-aid; does drink tea      *Update as of 04/19/2019*   Patient has not able to walk around her neighborhood, or down her street, due to COVID. Patient is eager to get back to this once she feels "safe" to leave her house.    Social Determinants of Health   Financial Resource Strain: Low Risk  (07/09/2023)   Overall Financial Resource Strain (CARDIA)    Difficulty of Paying Living Expenses: Not hard at all  Food Insecurity: No Food Insecurity (07/09/2023)   Hunger Vital Sign    Worried About Running Out of Food in the Last Year: Never true    Ran Out of Food in the Last Year: Never true  Transportation Needs: No Transportation Needs (07/09/2023)   PRAPARE - Administrator, Civil Service (Medical): No    Lack of Transportation (Non-Medical): No  Physical Activity: Insufficiently Active (07/09/2023)   Exercise Vital Sign    Days of Exercise per Week: 2 days    Minutes of Exercise per Session: 30 min  Stress: No Stress Concern Present (07/09/2023)   [employer] of Occupational Health - Occupational Stress Questionnaire    Feeling of Stress : Not at all  Social Connections: Moderately Integrated (07/09/2023)   Social Connection and Isolation Panel [NHANES]    Frequency of Communication with Friends and Family: More than three times a week    Frequency of Social Gatherings with Friends and Family: Three times a week    Attends Religious Services: More than 4 times per year    Active Member of Clubs or Organizations: Yes    Attends Banker Meetings: More than 4 times per year    Marital Status: Widowed   Family History  Problem Relation Age of Onset   Diabetes Mother    Stroke Mother    Heart disease Father    Anemia Father    Esophageal cancer Sister    Cancer Sister 17        nose cancer    Diabetes Sister        2 sisters type 1 diabetes    Kidney disease Sister        waiting for a kidney transplant- on dialysis   Diabetes Brother        Type 2 diabetic    Diabetes Son        Type 2    Hypertension Son    Cancer Cousin 30  ovarian cancer    Cancer Cousin 49       ovarian cancer    Colon cancer Neg Hx    Rectal cancer Neg Hx    Stomach cancer Neg Hx    No Known Allergies Current Outpatient Medications  Medication Sig Dispense Refill   acetaminophen (TYLENOL) 500 MG tablet Take 1 tablet (500 mg total) by mouth every 8 (eight) hours for 10 days. 30 tablet 0   aspirin EC 325 MG tablet Take 1 tablet (325 mg total) by mouth daily. 14 tablet 0   ibuprofen (ADVIL) 800 MG tablet Take 1 tablet (800 mg total) by mouth every 8 (eight) hours for 10 days. Please take with food, please alternate with acetaminophen 30 tablet 0   oxyCODONE (ROXICODONE) 5 MG immediate release tablet Take 1 tablet (5 mg total) by mouth every 4 (four) hours as needed for severe pain or breakthrough pain. 10 tablet 0   acetaminophen (TYLENOL) 650 MG CR tablet Take 650 mg by mouth every 8 (eight) hours as needed for pain.     allopurinol (ZYLOPRIM) 100 MG tablet TAKE 1 TABLET BY MOUTH DAILY 100 tablet 2   atorvastatin (LIPITOR) 40 MG tablet TAKE 1 TABLET BY MOUTH DAILY 100 tablet 2   blood glucose meter kit and supplies Dispense based on patient and insurance preference.Use to check blood sugar in the morning. (FOR ICD-10 E10.9, E11.9).  Lum Babe is what she has been using. 1 each 0   Blood Glucose Monitoring Suppl (ONETOUCH VERIO) w/Device KIT Use to check blood glucose every morning. E11.9 1 kit 0   CARESTART COVID-19 HOME TEST KIT See admin instructions.     colchicine 0.6 MG tablet TAKE 1 TABLET BY MOUTH DAILY 100 tablet 2   dorzolamide (TRUSOPT) 2 % ophthalmic solution 1 drop daily.     dorzolamide-timolol (COSOPT) 22.3-6.8 MG/ML ophthalmic solution Place 1 drop into both  eyes 2 (two) times daily.  0   empagliflozin (JARDIANCE) 25 MG TABS tablet Take 1 tablet (25 mg total) by mouth daily. 100 tablet 2   fluticasone furoate-vilanterol (BREO ELLIPTA) 200-25 MCG/ACT AEPB Inhale 1 puff into the lungs daily. 60 each 3   gabapentin (NEURONTIN) 100 MG capsule TAKE 1 CAPSULE BY MOUTH AT  BEDTIME 100 capsule 1   glucose blood (ONETOUCH ULTRA) test strip USE TO CHECK BLOOD SUGAR EVERY MORNING 100 each 2   glucose blood (ONETOUCH VERIO) test strip Use as instructed 100 each 12   glucose blood (ONETOUCH VERIO) test strip CHECK BLOOD SUGAR IN THE  MORNING AS DIRECTED 100 strip 3   Insulin Pen Needle (EASY COMFORT PEN NEEDLES) 31G X 8 MM MISC USE TO INJECT INSULIN EVERY DAY AS DIRECTED 100 each 3   Lancet Devices (ONETOUCH DELICA PLUS LANCING) MISC USE ONCE DAILY 100 each 3   Lancets (ONETOUCH DELICA PLUS LANCET33G) MISC USE DAILY AS NEEDED. 100 each prn   LANTUS SOLOSTAR 100 UNIT/ML Solostar Pen INJECT 32 UNITS EACH MORNING 30 mL 6   latanoprost (XALATAN) 0.005 % ophthalmic solution Place 1 drop into both eyes at bedtime.   0   losartan (COZAAR) 100 MG tablet TAKE 1 TABLET BY MOUTH AT  BEDTIME 100 tablet 2   metoprolol succinate (TOPROL-XL) 25 MG 24 hr tablet TAKE 1 TABLET BY MOUTH ONCE  DAILY 100 tablet 2   Miconazole Nitrate (LOTRIMIN AF) 2 % AERO Spray between toes once daily for 4 weeks. 150 g 1   ONETOUCH VERIO test strip CHECK  BLOOD SUGAR IN THE MORNING AS DIRECTED 100 strip 2   OZEMPIC, 2 MG/DOSE, 8 MG/3ML SOPN INJECT SUBCUTANEOUSLY 2 MG ONCE  A WEEK 9 mL 3   spironolactone (ALDACTONE) 25 MG tablet TAKE 1 TABLET BY MOUTH DAILY 100 tablet 2   No current facility-administered medications for this visit.   No results found.  Review of Systems:   A ROS was performed including pertinent positives and negatives as documented in the HPI.  Physical Exam :   Constitutional: NAD and appears stated age Neurological: Alert and oriented Psych: Appropriate affect and  cooperative There were no vitals taken for this visit.   Comprehensive Musculoskeletal Exam:    Musculoskeletal Exam    Inspection Right Left  Skin No atrophy or winging No atrophy or winging  Palpation    Tenderness none Glenohumeral  Range of Motion    Flexion (passive) 170 30  Flexion (active) 170 30  Abduction 170 20  ER at the side 70 20  Can reach behind back to T12 Side pocket  Strength     Full Limited  Special Tests    Pseudoparalytic No No  Neurologic    Fires PIN, radial, median, ulnar, musculocutaneous, axillary, suprascapular, long thoracic, and spinal accessory innervated muscles. No abnormal sensibility  Vascular/Lymphatic    Radial Pulse 2+ 2+  Cervical Exam    Patient has symmetric cervical range of motion with negative Spurling's test.  Special Test: Positive pseudoparalysis     Imaging:   Xray (3 views left shoulder): Advanced/end-stage glenohumeral osteoarthritis   I personally reviewed and interpreted the radiographs.   Assessment:   72 y.o. female with advanced left shoulder glenohumeral osteoarthritis.  At this time she has trialed physical therapy with quite limited overhead motion.  Given the fact that she does have essentially pseudoparalysis we did discuss the possibility of shoulder arthroplasty specifically reverse shoulder arthroplasty.  I did describe that given that she is walker dependent that there would be a period where she would need to rely on a cane.  We did discuss specific risks associated with losing her balance such as dislocation of the prosthesis.  After discussion of all of these she is ultimately elected for left reverse shoulder arthroplasty  Plan :    -Plan for left reverse shoulder arthroplasty    After a lengthy discussion of treatment options, including risks, benefits, alternatives, complications of surgical and nonsurgical conservative options, the patient elected surgical repair.   The patient  is aware of the  material risks  and complications including, but not limited to injury to adjacent structures, neurovascular injury, infection, numbness, bleeding, implant failure, thermal burns, stiffness, persistent pain, failure to heal, disease transmission from allograft, need for further surgery, dislocation, anesthetic risks, blood clots, risks of death,and others. The probabilities of surgical success and failure discussed with patient given their particular co-morbidities.The time and nature of expected rehabilitation and recovery was discussed.The patient's questions were all answered preoperatively.  No barriers to understanding were noted. I explained the natural history of the disease process and Rx rationale.  I explained to the patient what I considered to be reasonable expectations given their personal situation.  The final treatment plan was arrived at through a shared patient decision making process model.     I personally saw and evaluated the patient, and participated in the management and treatment plan.  Huel Cote, MD Attending Physician, Orthopedic Surgery  This document was dictated using Dragon voice recognition software. A reasonable attempt at  proof reading has been made to minimize errors.

## 2023-08-10 ENCOUNTER — Ambulatory Visit (INDEPENDENT_AMBULATORY_CARE_PROVIDER_SITE_OTHER): Payer: 59 | Admitting: Podiatry

## 2023-08-10 ENCOUNTER — Encounter: Payer: Self-pay | Admitting: Podiatry

## 2023-08-10 DIAGNOSIS — B351 Tinea unguium: Secondary | ICD-10-CM | POA: Diagnosis not present

## 2023-08-10 DIAGNOSIS — M79674 Pain in right toe(s): Secondary | ICD-10-CM | POA: Diagnosis not present

## 2023-08-10 DIAGNOSIS — M79675 Pain in left toe(s): Secondary | ICD-10-CM | POA: Diagnosis not present

## 2023-08-10 DIAGNOSIS — E1140 Type 2 diabetes mellitus with diabetic neuropathy, unspecified: Secondary | ICD-10-CM | POA: Diagnosis not present

## 2023-08-10 NOTE — Progress Notes (Signed)
This patient returns to my office for at risk foot care.  This patient requires this care by a professional since this patient will be at risk due to having diabetes.   This patient is unable to cut nails herself since the patient cannot reach her nails.These nails are painful walking and wearing shoes.  This patient presents for at risk foot care today.  General Appearance  Alert, conversant and in no acute stress.  Vascular  Dorsalis pedis and posterior tibial  pulses are palpable  bilaterally.  Capillary return is within normal limits  bilaterally. Temperature is within normal limits  bilaterally.  Neurologic  Senn-Weinstein monofilament wire test absent   bilaterally. Muscle power within normal limits bilaterally.  Nails Thick disfigured discolored nails with subungual debris  from hallux to fifth toes bilaterally. No evidence of bacterial infection or drainage bilaterally.  Orthopedic  No limitations of motion  feet .  No crepitus or effusions noted.  No bony pathology or digital deformities noted. Asymptomatic  HAV  B/L.  Pes planus  B/L. Hammer toes second left foot. Contracted digits  B/L.  Skin  normotropic skin with no porokeratosis noted bilaterally.  No signs of infections or ulcers noted.     Onychomycosis  Pain in right toes  Pain in left toes    Consent was obtained for treatment procedures.   Mechanical debridement of nails 1-5  bilaterally performed with a nail nipper.  Filed with dremel without incident. Patient casted for diabetic shoes.   Return office visit  3 months                    Told patient to return for periodic foot care and evaluation due to potential at risk complications   Helane Gunther DPM

## 2023-08-11 ENCOUNTER — Telehealth: Payer: Self-pay | Admitting: Orthopaedic Surgery

## 2023-08-11 NOTE — Telephone Encounter (Signed)
Patient called. Would like [name] to call her.

## 2023-08-11 NOTE — Therapy (Signed)
OUTPATIENT PHYSICAL THERAPY LOWER EXTREMITY EVALUATION   Patient Name: [name] MRN: [id number] DOB:1951-04-20, 72 y.o., female Today's Date: 08/12/2023  END OF SESSION:  PT End of Session - 08/12/23 1008     Visit Number 1    Number of Visits 12    Date for PT Re-Evaluation 07/13/23    Authorization Type UHC    PT Start Time 1000    PT Stop Time 1045    PT Time Calculation (min) 45 min    Activity Tolerance Patient limited by pain    Behavior During Therapy [facility] for tasks assessed/performed             Past Medical History:  Diagnosis Date   Anemia    Arthritis    Asthma    Blood transfusion without reported diagnosis    Patients believes 2015 when she overdosed on "medications"    Breast cancer (HCC)    Breast cancer of lower-outer quadrant of right female breast (HCC) 02/13/2015   Cataract    CHF, acute (HCC) 05/03/2012   Depression    Diabetes mellitus    Eczema    Fatty liver    Fatty infiltration of liver noted on 03/2012 CT scan   Fibromyalgia    Glaucoma    Heart disease    History of kidney stones    w/ hx of hydronephrosis - followed by [facility]   HIV nonspecific serology    2006: indeterminate HIV blood test, seen by ID, felt secondary to cross reacting antibodies with no further workup felt necessary at that time    Hyperlipidemia    Hypertension    Obesity    Personal history of radiation therapy 2016   right   Radiation 05/07/15-06/23/15   Right Breast   Past Surgical History:  Procedure Laterality Date   BREAST BIOPSY Left 02/10/2015   malignant    BREAST LUMPECTOMY Right 03/21/2015   CHOLECYSTECTOMY  2003   CYSTOSCOPY W/ LITHOLAPAXY / EHL     JOINT REPLACEMENT     bilateral knee replacement   LEFT AND RIGHT HEART CATHETERIZATION WITH CORONARY ANGIOGRAM N/A 05/02/2012   Procedure: LEFT AND RIGHT HEART CATHETERIZATION WITH CORONARY ANGIOGRAM;  Surgeon: Kathleene Hazel, MD;  Location: [facility] CATH LAB;  Service: Cardiovascular;   Laterality: N/A;   RADIOACTIVE SEED GUIDED PARTIAL MASTECTOMY WITH AXILLARY SENTINEL LYMPH NODE BIOPSY Right 03/21/2015   Procedure: RIGHT  PARTIAL MASTECTOMY WITH RADIOACTIVE SEED LOCALIZATION RIGHT  AXILLARY SENTINEL LYMPH NODE BIOPSY;  Surgeon: Claud Kelp, MD;  Location: [facility];  Service: General;  Laterality: Right;   REPLACEMENT TOTAL KNEE BILATERAL  2005 &2006   VASCULAR SURGERY Right 03/15/2013   Ultrasound guided sclerotherapy   Patient Active Problem List   Diagnosis Date Noted   Renal disease due to diabetes mellitus (HCC) 04/28/2023   Left shoulder pain 09/06/2022   Left breast mass 11/11/2021   Tinea pedis 04/09/2020   Spinal stenosis of lumbar region without neurogenic claudication 02/08/2019   Gout 05/16/2018   Gait instability 01/03/2018   Tremor of both hands    Stuttering    Essential hypertension    Hyperlipidemia 08/13/2015   Breast cancer of lower-outer quadrant of right female breast (HCC) 02/13/2015   Type 2 diabetes mellitus with diabetic foot deformity (HCC) 01/01/2014   Chronic combined systolic and diastolic heart failure (HCC) 02/26/2013   OBESITY 02/05/2009   Asthma, intermittent 02/08/2008   Neuropathy due to secondary diabetes (HCC) 05/18/2007  NEPHROLITHIASIS 01/26/2007   DJD (degenerative joint disease), multiple sites 01/26/2007    PCP: Carney Living, MD   REFERRING PROVIDER: Tarry Kos, MD  REFERRING DIAG: [phone] (ICD-10-CM) - Chronic pain of left knee  THERAPY DIAG:  Chronic pain of left knee  Muscle weakness (generalized)  Other abnormalities of gait and mobility  Rationale for Evaluation and Treatment: Rehabilitation  ONSET DATE: chronic  SUBJECTIVE:   SUBJECTIVE STATEMENT: Chronic L knee pain worse with prolonged activity and positions.  L TKA 2005.  Symptoms superficial vs. Deep.  Will undergo L TSA in future once medical clearance obtained.  PERTINENT HISTORY: Plan: [name] is a  72 year old female with chronic left shoulder and left knee pain.  For the shoulder she has bone-on-bone glenohumeral osteoarthritis.  We will make a referral to Dr. Steward Drone or August Saucer for surgical consultation.  If she is not interested in cortisone injections at this point as they have not worked in the past.  For the knee I do not see any problems with the implant.  I think her issue is lack of quad strength relative to her weight.  I will make a referral for physical therapy.  Follow-up as needed. PAIN:  Are you having pain? Yes: NPRS scale: 6-8/10 Pain location: L knee Pain description: ache Aggravating factors: prolonged activities and positioning Relieving factors: rest and position changes  PRECAUTIONS: None  RED FLAGS: None   WEIGHT BEARING RESTRICTIONS: No  FALLS:  Has patient fallen in last 6 months? No  OCCUPATION: retired  PLOF: Independent with basic ADLs  PATIENT GOALS: To reduce my knee symptoms  NEXT MD VISIT: PRN  OBJECTIVE:   DIAGNOSTIC FINDINGS: XR KNEE 3 VIEW LEFT   Result Date: 07/21/2023 X-rays demonstrate a total knee replacement with tibial baseplate in slight varus alignment.  No evidence of subsidence or loosening.   PATIENT SURVEYS:  FOTO 44(57 predicted)  MUSCLE LENGTH: Hamstrings: Left 70 deg  PALPATION: deferred  LOWER EXTREMITY ROM:  Active ROM Right eval Left eval  Hip flexion    Hip extension    Hip abduction    Hip adduction    Hip internal rotation    Hip external rotation    Knee flexion  114d  Knee extension  -2d  Ankle dorsiflexion    Ankle plantarflexion    Ankle inversion    Ankle eversion     (Blank rows = not tested)  LOWER EXTREMITY MMT:  MMT Right eval Left eval  Hip flexion  4  Hip extension  4  Hip abduction  4  Hip adduction    Hip internal rotation    Hip external rotation    Knee flexion  4-  Knee extension  4-  Ankle dorsiflexion    Ankle plantarflexion  4  Ankle inversion    Ankle eversion      (Blank rows = not tested)  FUNCTIONAL TESTS:  5x STS 17s 2 MWT 317ft with RW  GAIT: Distance walked: 43ft x2 Assistive device utilized: Walker - 4 wheeled Level of assistance: Modified independence Comments: slow cadene   TODAY'S TREATMENT:  DATE: 08/12/23 Eval and HEP    PATIENT EDUCATION:  Education details: Discussed eval findings, rehab rationale and POC and patient is in agreement  Person educated: Patient Education method: Explanation Education comprehension: verbalized understanding and needs further education  HOME EXERCISE PROGRAM: Access Code: [email or web address] URL: [email or web address] Date: 08/12/2023 Prepared by: Gustavus Bryant  Exercises - Seated Long Arc Quad  - 2 x daily - 5 x weekly - 2 sets - 10 reps - 2s hold - Standing Heel Raise with Support  - 2 x daily - 5 x weekly - 2 sets - 10 reps - Standing Knee Flexion AROM  - 2 x daily - 5 x weekly - 2 sets - 10 reps  ASSESSMENT:  CLINICAL IMPRESSION: Patient is a 72 y.o. female  who was seen today for physical therapy evaluation and treatment for chronic L knee pain and subsequent hip/ankle weakness. Patient presents with good L knee ROM and strength but demonstrates weakness in L hip and ankle.  Initial 2 MWT time is functional and will compare using SPC at future visits.  Patient is a good candidate for OPPT.  OBJECTIVE IMPAIRMENTS: Abnormal gait, decreased activity tolerance, decreased endurance, decreased knowledge of condition, decreased mobility, difficulty walking, decreased ROM, decreased strength, and pain.   ACTIVITY LIMITATIONS: carrying, lifting, sitting, standing, squatting, stairs, and locomotion level  PARTICIPATION LIMITATIONS: community activity  PERSONAL FACTORS: Age, Fitness, Past/current experiences, Time since onset of injury/illness/exacerbation, and 1  comorbidity: severe L shoulder OA  are also affecting patient's functional outcome.   REHAB POTENTIAL: Good  CLINICAL DECISION MAKING: Stable/uncomplicated  EVALUATION COMPLEXITY: Low   GOALS: Goals reviewed with patient? No  SHORT TERM GOALS: Target date: 09/02/2023   Patient to demonstrate independence in HEP  Baseline:  Goal status: INITIAL  2.  Assess 2 MWT with SPC and compare with RW results. Baseline: TBD Goal status: INITIAL   LONG TERM GOALS: Target date: 09/23/2023    Assess 2 MWT with cane Baseline: TBD Goal status: INITIAL  2.  Increase LLE strength to 4+/5 Baseline:  MMT Right eval Left eval  Hip flexion  4  Hip extension  4  Hip abduction  4  Hip adduction    Hip internal rotation    Hip external rotation    Knee flexion  4-  Knee extension  4-  Ankle dorsiflexion    Ankle plantarflexion  4   Goal status: INITIAL  3.  Decrease pain to 4/10 at worst Baseline: 6-8/10 Goal status: INITIAL  4.  Increase FOTO score to 57 Baseline: 44 Goal status: INITIAL     PLAN:  PT FREQUENCY: 1-2x/week  PT DURATION: 8 weeks  PLANNED INTERVENTIONS: Therapeutic exercises, Therapeutic activity, Neuromuscular re-education, Balance training, Gait training, Patient/Family education, Self Care, Joint mobilization, Stair training, Dry Needling, Electrical stimulation, Cryotherapy, Moist heat, Manual therapy, and Re-evaluation  PLAN FOR NEXT SESSION: HEP review and update, manual techniques as appropriate, aerobic tasks, ROM and flexibility activities, strengthening and PREs, TPDN, gait and balance training as needed     Hildred Laser, PT 08/12/2023, 10:11 AM

## 2023-08-12 ENCOUNTER — Ambulatory Visit: Payer: 59 | Attending: Family Medicine

## 2023-08-12 DIAGNOSIS — M6281 Muscle weakness (generalized): Secondary | ICD-10-CM

## 2023-08-12 DIAGNOSIS — M25512 Pain in left shoulder: Secondary | ICD-10-CM | POA: Insufficient documentation

## 2023-08-12 DIAGNOSIS — R2689 Other abnormalities of gait and mobility: Secondary | ICD-10-CM

## 2023-08-12 DIAGNOSIS — M25562 Pain in left knee: Secondary | ICD-10-CM | POA: Diagnosis not present

## 2023-08-12 DIAGNOSIS — G8929 Other chronic pain: Secondary | ICD-10-CM

## 2023-08-12 NOTE — Telephone Encounter (Signed)
RC to patient who was asking if her surgery has been scheduled. I informed her we are still waiting on medical clearance and that she will hear from April Beavers about scheduling surgery once she receives clearance. She also mentioned she forgot to pick up her post op medications, I informed her she can pick those up anytime that is convenient for her at the pharmacy.  No further questions asked

## 2023-08-15 ENCOUNTER — Telehealth: Payer: Self-pay | Admitting: Orthopaedic Surgery

## 2023-08-15 NOTE — Telephone Encounter (Signed)
I spoke with the patient today, and schedule her left RSA for 09/20/23. Patient states she has not had a CT of that shoulder. I do not see a referral for a CT in the patient's chart. Please advise.

## 2023-08-16 ENCOUNTER — Other Ambulatory Visit (HOSPITAL_BASED_OUTPATIENT_CLINIC_OR_DEPARTMENT_OTHER): Payer: Self-pay | Admitting: Orthopaedic Surgery

## 2023-08-16 DIAGNOSIS — M19012 Primary osteoarthritis, left shoulder: Secondary | ICD-10-CM

## 2023-08-16 NOTE — Telephone Encounter (Signed)
CT has been ordered to [facility] facility

## 2023-08-17 ENCOUNTER — Other Ambulatory Visit: Payer: Self-pay

## 2023-08-17 ENCOUNTER — Encounter: Payer: Self-pay | Admitting: Family Medicine

## 2023-08-17 ENCOUNTER — Other Ambulatory Visit (HOSPITAL_BASED_OUTPATIENT_CLINIC_OR_DEPARTMENT_OTHER): Payer: Self-pay | Admitting: Orthopaedic Surgery

## 2023-08-17 ENCOUNTER — Ambulatory Visit (INDEPENDENT_AMBULATORY_CARE_PROVIDER_SITE_OTHER): Payer: 59 | Admitting: Family Medicine

## 2023-08-17 VITALS — BP 163/87 | HR 71 | Ht 62.0 in | Wt 214.0 lb

## 2023-08-17 DIAGNOSIS — E1169 Type 2 diabetes mellitus with other specified complication: Secondary | ICD-10-CM

## 2023-08-17 DIAGNOSIS — Z23 Encounter for immunization: Secondary | ICD-10-CM | POA: Diagnosis not present

## 2023-08-17 DIAGNOSIS — M21969 Unspecified acquired deformity of unspecified lower leg: Secondary | ICD-10-CM

## 2023-08-17 DIAGNOSIS — F8081 Childhood onset fluency disorder: Secondary | ICD-10-CM

## 2023-08-17 DIAGNOSIS — I10 Essential (primary) hypertension: Secondary | ICD-10-CM | POA: Diagnosis not present

## 2023-08-17 DIAGNOSIS — M19012 Primary osteoarthritis, left shoulder: Secondary | ICD-10-CM

## 2023-08-17 LAB — POCT GLYCOSYLATED HEMOGLOBIN (HGB A1C): HbA1c, POC (controlled diabetic range): 7 % (ref 0.0–7.0)

## 2023-08-17 NOTE — Assessment & Plan Note (Signed)
Well controlled.  Continue her current medications

## 2023-08-17 NOTE — Progress Notes (Signed)
    SUBJECTIVE:   CHIEF COMPLAINT / HPI:   Stuttering Episodes of stuttering and shaking have recurred. Most recent was when in line at store.  Lasted for several hours.  Before that last happened several years ago.  Was seen by neurology without any definite diagnosis.  No recent change in her medications or falls.  She does not think her blood pressure was low  Hypertension Brings all her medications and knows them.  Mild lightheadness sometimes when stands.  No falls.  Using walker all the time   Diabetes No low blood sugars Knows all her meds   OBJECTIVE:   BP (!) 163/87   Pulse 71   Ht 5\' 2"  (1.575 m)   Wt 214 lb (97.1 kg)   SpO2 100%   BMI 39.14 kg/m   Alert conversant No stuttering noticed Finger to nose normal except for very mild bilateral tremor Heart - Regular rate and rhythm.  No murmurs, gallops or rubs.    Lungs:  Normal respiratory effort, chest expands symmetrically. Lungs are clear to auscultation, no crackles or wheezes. No pitting edema  ASSESSMENT/PLAN:   Type 2 diabetes mellitus with diabetic foot deformity (HCC) Assessment & Plan: Well controlled.  Continue her current medications   Orders: -     POCT glycosylated hemoglobin (Hb A1C)  Encounter for immunization -     Flu Vaccine Trivalent High Dose (Fluad) -     Pfizer Comirnaty Covid-19 Vaccine 31yrs & older  Essential hypertension Assessment & Plan: Primarily at goal at home.  I feel increasing her medications would increase her risk of falls and complications      Patient Instructions  Good to see you today - Thank you for coming in  Things we discussed today:  Stuttering Shaking - check your blood sugar if this happens   Hypertension Keep taking all your medications  Diabetes  Doing great   Please always bring your medication bottles  Come back to see me in December 2024    Carney Living, MD [facility]

## 2023-08-17 NOTE — Patient Instructions (Signed)
Good to see you today - Thank you for coming in  Things we discussed today:  Stuttering Shaking - check your blood sugar if this happens   Hypertension Keep taking all your medications  Diabetes  Doing great   Please always bring your medication bottles  Come back to see me in December 2024

## 2023-08-17 NOTE — Assessment & Plan Note (Signed)
Chronic intermittent idiopathic.  Has been evaluated by neurology in past.  Normal exam today.  No likely medication cause.  Not very consistent with  TIA.  Perhaps low blood sugar?  Asked her to check in recurs

## 2023-08-17 NOTE — Assessment & Plan Note (Signed)
Primarily at goal at home.  I feel increasing her medications would increase her risk of falls and complications

## 2023-08-18 ENCOUNTER — Other Ambulatory Visit (HOSPITAL_BASED_OUTPATIENT_CLINIC_OR_DEPARTMENT_OTHER): Payer: Self-pay

## 2023-08-21 ENCOUNTER — Ambulatory Visit (HOSPITAL_BASED_OUTPATIENT_CLINIC_OR_DEPARTMENT_OTHER)
Admission: RE | Admit: 2023-08-21 | Discharge: 2023-08-21 | Disposition: A | Discharge status: Home / Self Care | Payer: 59 | Source: Ambulatory Visit | Attending: Orthopaedic Surgery | Admitting: Orthopaedic Surgery

## 2023-08-21 DIAGNOSIS — I700 Atherosclerosis of aorta: Secondary | ICD-10-CM | POA: Diagnosis not present

## 2023-08-21 DIAGNOSIS — M25512 Pain in left shoulder: Secondary | ICD-10-CM | POA: Diagnosis not present

## 2023-08-21 DIAGNOSIS — M19012 Primary osteoarthritis, left shoulder: Secondary | ICD-10-CM | POA: Diagnosis not present

## 2023-08-23 NOTE — Therapy (Unsigned)
OUTPATIENT PHYSICAL THERAPY LOWER EXTREMITY EVALUATION   Patient Name: [name] MRN: [id number] DOB:June 20, 1951, 72 y.o., female Today's Date: 08/24/2023  END OF SESSION:  PT End of Session - 08/24/23 1452     Visit Number 2    Number of Visits 12    Date for PT Re-Evaluation 07/13/23    Authorization Type UHC    PT Start Time 1450    PT Stop Time 1530    PT Time Calculation (min) 40 min    Activity Tolerance Patient limited by pain    Behavior During Therapy [facility] for tasks assessed/performed              Past Medical History:  Diagnosis Date   Anemia    Arthritis    Asthma    Blood transfusion without reported diagnosis    Patients believes 2015 when she overdosed on "medications"    Breast cancer (HCC)    Breast cancer of lower-outer quadrant of right female breast (HCC) 02/13/2015   Cataract    CHF, acute (HCC) 05/03/2012   Depression    Diabetes mellitus    Eczema    Fatty liver    Fatty infiltration of liver noted on 03/2012 CT scan   Fibromyalgia    Glaucoma    Heart disease    History of kidney stones    w/ hx of hydronephrosis - followed by [facility]   HIV nonspecific serology    2006: indeterminate HIV blood test, seen by ID, felt secondary to cross reacting antibodies with no further workup felt necessary at that time    Hyperlipidemia    Hypertension    Obesity    Personal history of radiation therapy 2016   right   Radiation 05/07/15-06/23/15   Right Breast   Past Surgical History:  Procedure Laterality Date   BREAST BIOPSY Left 02/10/2015   malignant    BREAST LUMPECTOMY Right 03/21/2015   CHOLECYSTECTOMY  2003   CYSTOSCOPY W/ LITHOLAPAXY / EHL     JOINT REPLACEMENT     bilateral knee replacement   LEFT AND RIGHT HEART CATHETERIZATION WITH CORONARY ANGIOGRAM N/A 05/02/2012   Procedure: LEFT AND RIGHT HEART CATHETERIZATION WITH CORONARY ANGIOGRAM;  Surgeon: Kathleene Hazel, MD;  Location: [facility] CATH LAB;  Service:  Cardiovascular;  Laterality: N/A;   RADIOACTIVE SEED GUIDED PARTIAL MASTECTOMY WITH AXILLARY SENTINEL LYMPH NODE BIOPSY Right 03/21/2015   Procedure: RIGHT  PARTIAL MASTECTOMY WITH RADIOACTIVE SEED LOCALIZATION RIGHT  AXILLARY SENTINEL LYMPH NODE BIOPSY;  Surgeon: Claud Kelp, MD;  Location: [facility];  Service: General;  Laterality: Right;   REPLACEMENT TOTAL KNEE BILATERAL  2005 &2006   VASCULAR SURGERY Right 03/15/2013   Ultrasound guided sclerotherapy   Patient Active Problem List   Diagnosis Date Noted   Renal disease due to diabetes mellitus (HCC) 04/28/2023   Left shoulder pain 09/06/2022   Left breast mass 11/11/2021   Tinea pedis 04/09/2020   Spinal stenosis of lumbar region without neurogenic claudication 02/08/2019   Gout 05/16/2018   Gait instability 01/03/2018   Tremor of both hands    Stuttering    Essential hypertension    Hyperlipidemia 08/13/2015   Breast cancer of lower-outer quadrant of right female breast (HCC) 02/13/2015   Type 2 diabetes mellitus with diabetic foot deformity (HCC) 01/01/2014   Chronic combined systolic and diastolic heart failure (HCC) 02/26/2013   OBESITY 02/05/2009   Asthma, intermittent 02/08/2008   Neuropathy due to secondary diabetes (HCC) 05/18/2007  NEPHROLITHIASIS 01/26/2007   DJD (degenerative joint disease), multiple sites 01/26/2007    PCP: Carney Living, MD   REFERRING PROVIDER: Tarry Kos, MD  REFERRING DIAG: [phone] (ICD-10-CM) - Chronic pain of left knee  THERAPY DIAG:  Chronic pain of left knee  Muscle weakness (generalized)  Other abnormalities of gait and mobility  Rationale for Evaluation and Treatment: Rehabilitation  ONSET DATE: chronic  SUBJECTIVE:   SUBJECTIVE STATEMENT: No change in knee condition.  Rainy weather has made her sore all over.  Date has been set for TSA on L in October.  PERTINENT HISTORY: Plan: [name] is a 72 year old female with chronic left shoulder  and left knee pain.  For the shoulder she has bone-on-bone glenohumeral osteoarthritis.  We will make a referral to Dr. Steward Drone or August Saucer for surgical consultation.  If she is not interested in cortisone injections at this point as they have not worked in the past.  For the knee I do not see any problems with the implant.  I think her issue is lack of quad strength relative to her weight.  I will make a referral for physical therapy.  Follow-up as needed. PAIN:  Are you having pain? Yes: NPRS scale: 6-8/10 Pain location: L knee Pain description: ache Aggravating factors: prolonged activities and positioning Relieving factors: rest and position changes  PRECAUTIONS: None  RED FLAGS: None   WEIGHT BEARING RESTRICTIONS: No  FALLS:  Has patient fallen in last 6 months? No  OCCUPATION: retired  PLOF: Independent with basic ADLs  PATIENT GOALS: To reduce my knee symptoms  NEXT MD VISIT: PRN  OBJECTIVE:   DIAGNOSTIC FINDINGS: XR KNEE 3 VIEW LEFT   Result Date: 07/21/2023 X-rays demonstrate a total knee replacement with tibial baseplate in slight varus alignment.  No evidence of subsidence or loosening.   PATIENT SURVEYS:  FOTO 44(57 predicted)  MUSCLE LENGTH: Hamstrings: Left 70 deg  PALPATION: deferred  LOWER EXTREMITY ROM:  Active ROM Right eval Left eval  Hip flexion    Hip extension    Hip abduction    Hip adduction    Hip internal rotation    Hip external rotation    Knee flexion  114d  Knee extension  -2d  Ankle dorsiflexion    Ankle plantarflexion    Ankle inversion    Ankle eversion     (Blank rows = not tested)  LOWER EXTREMITY MMT:  MMT Right eval Left eval  Hip flexion  4  Hip extension  4  Hip abduction  4  Hip adduction    Hip internal rotation    Hip external rotation    Knee flexion  4-  Knee extension  4-  Ankle dorsiflexion    Ankle plantarflexion  4  Ankle inversion    Ankle eversion     (Blank rows = not tested)  FUNCTIONAL  TESTS:  5x STS 17s 2 MWT 365ft with RW  GAIT: Distance walked: 73ft x2 Assistive device utilized: Environmental consultant - 4 wheeled Level of assistance: Modified independence Comments: slow cadene   TODAY'S TREATMENT:       [facility] Adult PT Treatment:                                                DATE: 08/24/23 Therapeutic Exercise: Nustep L2 6 min FAQs L 2# 15x2 SAQs 2# 15x2 SLR  L 15x, 11x due to RLE cramping Heel toe 15x2 Standing hamstring curls                                                                                                                          DATE: 08/12/23 Eval and HEP    PATIENT EDUCATION:  Education details: Discussed eval findings, rehab rationale and POC and patient is in agreement  Person educated: Patient Education method: Explanation Education comprehension: verbalized understanding and needs further education  HOME EXERCISE PROGRAM: Access Code: [email or web address] URL: [email or web address] Date: 08/12/2023 Prepared by: Gustavus Bryant  Exercises - Seated Long Arc Quad  - 2 x daily - 5 x weekly - 2 sets - 10 reps - 2s hold - Standing Heel Raise with Support  - 2 x daily - 5 x weekly - 2 sets - 10 reps - Standing Knee Flexion AROM  - 2 x daily - 5 x weekly - 2 sets - 10 reps  ASSESSMENT:  CLINICAL IMPRESSION: First f/u session since eval.  No change to reports.  Began ROM and strengthening tasks for L knee.  Initiated aerobic work and PREs to L knee. Able to complete all tasks w/o increasing L knee symptoms.   Patient is a 72 y.o. female  who was seen today for physical therapy evaluation and treatment for chronic L knee pain and subsequent hip/ankle weakness. Patient presents with good L knee ROM and strength but demonstrates weakness in L hip and ankle.  Initial 2 MWT time is functional and will compare using SPC at future visits.  Patient is a good candidate for OPPT.  OBJECTIVE IMPAIRMENTS: Abnormal gait, decreased activity tolerance, decreased  endurance, decreased knowledge of condition, decreased mobility, difficulty walking, decreased ROM, decreased strength, and pain.   ACTIVITY LIMITATIONS: carrying, lifting, sitting, standing, squatting, stairs, and locomotion level  PARTICIPATION LIMITATIONS: community activity  PERSONAL FACTORS: Age, Fitness, Past/current experiences, Time since onset of injury/illness/exacerbation, and 1 comorbidity: severe L shoulder OA  are also affecting patient's functional outcome.   REHAB POTENTIAL: Good  CLINICAL DECISION MAKING: Stable/uncomplicated  EVALUATION COMPLEXITY: Low   GOALS: Goals reviewed with patient? No  SHORT TERM GOALS: Target date: 09/02/2023   Patient to demonstrate independence in HEP  Baseline:  Goal status: INITIAL  2.  Assess 2 MWT with SPC and compare with RW results. Baseline: TBD Goal status: INITIAL   LONG TERM GOALS: Target date: 09/23/2023    Assess 2 MWT with cane Baseline: TBD Goal status: INITIAL  2.  Increase LLE strength to 4+/5 Baseline:  MMT Right eval Left eval  Hip flexion  4  Hip extension  4  Hip abduction  4  Hip adduction    Hip internal rotation    Hip external rotation    Knee flexion  4-  Knee extension  4-  Ankle dorsiflexion    Ankle plantarflexion  4   Goal status: INITIAL  3.  Decrease pain to 4/10 at worst Baseline: 6-8/10  Goal status: INITIAL  4.  Increase FOTO score to 57 Baseline: 44 Goal status: INITIAL     PLAN:  PT FREQUENCY: 1-2x/week  PT DURATION: 8 weeks  PLANNED INTERVENTIONS: Therapeutic exercises, Therapeutic activity, Neuromuscular re-education, Balance training, Gait training, Patient/Family education, Self Care, Joint mobilization, Stair training, Dry Needling, Electrical stimulation, Cryotherapy, Moist heat, Manual therapy, and Re-evaluation  PLAN FOR NEXT SESSION: HEP review and update, manual techniques as appropriate, aerobic tasks, ROM and flexibility activities, strengthening and  PREs, TPDN, gait and balance training as needed     Hildred Laser, PT 08/24/2023, 3:29 PM

## 2023-08-24 ENCOUNTER — Ambulatory Visit: Payer: 59

## 2023-08-24 DIAGNOSIS — M6281 Muscle weakness (generalized): Secondary | ICD-10-CM

## 2023-08-24 DIAGNOSIS — G8929 Other chronic pain: Secondary | ICD-10-CM

## 2023-08-24 DIAGNOSIS — R2689 Other abnormalities of gait and mobility: Secondary | ICD-10-CM

## 2023-08-24 DIAGNOSIS — M25562 Pain in left knee: Secondary | ICD-10-CM | POA: Diagnosis not present

## 2023-08-24 DIAGNOSIS — M25512 Pain in left shoulder: Secondary | ICD-10-CM | POA: Diagnosis not present

## 2023-08-25 NOTE — Therapy (Signed)
OUTPATIENT PHYSICAL THERAPY LOWER EXTREMITY EVALUATION   Patient Name: [name] MRN: [id number] DOB:07-05-1951, 72 y.o., female Today's Date: 08/29/2023  END OF SESSION:  PT End of Session - 08/29/23 1213     Visit Number 3    Number of Visits 12    Date for PT Re-Evaluation 07/13/23    Authorization Type UHC    PT Start Time 1215    PT Stop Time 1300    PT Time Calculation (min) 45 min    Activity Tolerance Patient limited by pain    Behavior During Therapy [facility] for tasks assessed/performed               Past Medical History:  Diagnosis Date   Anemia    Arthritis    Asthma    Blood transfusion without reported diagnosis    Patients believes 2015 when she overdosed on "medications"    Breast cancer (HCC)    Breast cancer of lower-outer quadrant of right female breast (HCC) 02/13/2015   Cataract    CHF, acute (HCC) 05/03/2012   Depression    Diabetes mellitus    Eczema    Fatty liver    Fatty infiltration of liver noted on 03/2012 CT scan   Fibromyalgia    Glaucoma    Heart disease    History of kidney stones    w/ hx of hydronephrosis - followed by [facility]   HIV nonspecific serology    2006: indeterminate HIV blood test, seen by ID, felt secondary to cross reacting antibodies with no further workup felt necessary at that time    Hyperlipidemia    Hypertension    Obesity    Personal history of radiation therapy 2016   right   Radiation 05/07/15-06/23/15   Right Breast   Past Surgical History:  Procedure Laterality Date   BREAST BIOPSY Left 02/10/2015   malignant    BREAST LUMPECTOMY Right 03/21/2015   CHOLECYSTECTOMY  2003   CYSTOSCOPY W/ LITHOLAPAXY / EHL     JOINT REPLACEMENT     bilateral knee replacement   LEFT AND RIGHT HEART CATHETERIZATION WITH CORONARY ANGIOGRAM N/A 05/02/2012   Procedure: LEFT AND RIGHT HEART CATHETERIZATION WITH CORONARY ANGIOGRAM;  Surgeon: Kathleene Hazel, MD;  Location: [facility] CATH LAB;  Service:  Cardiovascular;  Laterality: N/A;   RADIOACTIVE SEED GUIDED PARTIAL MASTECTOMY WITH AXILLARY SENTINEL LYMPH NODE BIOPSY Right 03/21/2015   Procedure: RIGHT  PARTIAL MASTECTOMY WITH RADIOACTIVE SEED LOCALIZATION RIGHT  AXILLARY SENTINEL LYMPH NODE BIOPSY;  Surgeon: Claud Kelp, MD;  Location: [facility];  Service: General;  Laterality: Right;   REPLACEMENT TOTAL KNEE BILATERAL  2005 &2006   VASCULAR SURGERY Right 03/15/2013   Ultrasound guided sclerotherapy   Patient Active Problem List   Diagnosis Date Noted   Renal disease due to diabetes mellitus (HCC) 04/28/2023   Left shoulder pain 09/06/2022   Left breast mass 11/11/2021   Tinea pedis 04/09/2020   Spinal stenosis of lumbar region without neurogenic claudication 02/08/2019   Gout 05/16/2018   Gait instability 01/03/2018   Tremor of both hands    Stuttering    Essential hypertension    Hyperlipidemia 08/13/2015   Breast cancer of lower-outer quadrant of right female breast (HCC) 02/13/2015   Type 2 diabetes mellitus with diabetic foot deformity (HCC) 01/01/2014   Chronic combined systolic and diastolic heart failure (HCC) 02/26/2013   OBESITY 02/05/2009   Asthma, intermittent 02/08/2008   Neuropathy due to secondary diabetes (HCC) 05/18/2007  NEPHROLITHIASIS 01/26/2007   DJD (degenerative joint disease), multiple sites 01/26/2007    PCP: Carney Living, MD   REFERRING PROVIDER: Tarry Kos, MD  REFERRING DIAG: [phone] (ICD-10-CM) - Chronic pain of left knee  THERAPY DIAG:  Chronic pain of left knee  Other abnormalities of gait and mobility  Muscle weakness (generalized)  Rationale for Evaluation and Treatment: Rehabilitation  ONSET DATE: chronic  SUBJECTIVE:   SUBJECTIVE STATEMENT: L knee pain 9/10 following episode of rainy weather  PERTINENT HISTORY: Plan: Coty is a 72 year old female with chronic left shoulder and left knee pain.  For the shoulder she has bone-on-bone  glenohumeral osteoarthritis.  We will make a referral to Dr. Steward Drone or August Saucer for surgical consultation.  If she is not interested in cortisone injections at this point as they have not worked in the past.  For the knee I do not see any problems with the implant.  I think her issue is lack of quad strength relative to her weight.  I will make a referral for physical therapy.  Follow-up as needed. PAIN:  Are you having pain? Yes: NPRS scale: 6-8/10 Pain location: L knee Pain description: ache Aggravating factors: prolonged activities and positioning Relieving factors: rest and position changes  PRECAUTIONS: None  RED FLAGS: None   WEIGHT BEARING RESTRICTIONS: No  FALLS:  Has patient fallen in last 6 months? No  OCCUPATION: retired  PLOF: Independent with basic ADLs  PATIENT GOALS: To reduce my knee symptoms  NEXT MD VISIT: PRN  OBJECTIVE:   DIAGNOSTIC FINDINGS: XR KNEE 3 VIEW LEFT   Result Date: 07/21/2023 X-rays demonstrate a total knee replacement with tibial baseplate in slight varus alignment.  No evidence of subsidence or loosening.   PATIENT SURVEYS:  FOTO 44(57 predicted)  MUSCLE LENGTH: Hamstrings: Left 70 deg  PALPATION: deferred  LOWER EXTREMITY ROM:  Active ROM Right eval Left eval  Hip flexion    Hip extension    Hip abduction    Hip adduction    Hip internal rotation    Hip external rotation    Knee flexion  114d  Knee extension  -2d  Ankle dorsiflexion    Ankle plantarflexion    Ankle inversion    Ankle eversion     (Blank rows = not tested)  LOWER EXTREMITY MMT:  MMT Right eval Left eval  Hip flexion  4  Hip extension  4  Hip abduction  4  Hip adduction    Hip internal rotation    Hip external rotation    Knee flexion  4-  Knee extension  4-  Ankle dorsiflexion    Ankle plantarflexion  4  Ankle inversion    Ankle eversion     (Blank rows = not tested)  FUNCTIONAL TESTS:  5x STS 17s 2 MWT 348ft with RW  GAIT: Distance  walked: 57ft x2 Assistive device utilized: Environmental consultant - 4 wheeled Level of assistance: Modified independence Comments: slow cadence   TODAY'S TREATMENT:    [facility] Adult PT Treatment:                                                DATE: 08/29/23 Therapeutic Exercise: Nustep L4 6 min FAQs L 2# 15x2 SAQs 2# 15x2 SLR L 2# 10x limited by RLE cramping L heel slides 2# 15x2 Supine march 2# LLE 15/15 Heel raises  15x Toe raises 15x Standing hamstring curls     [facility] Adult PT Treatment:                                                DATE: 08/24/23 Therapeutic Exercise: Nustep L2 6 min FAQs L 2# 15x2 SAQs 2# 15x2 SLR L 15x, 11x due to RLE cramping Heel toe 15x2 Standing hamstring curls                                                                                                                          DATE: 08/12/23 Eval and HEP    PATIENT EDUCATION:  Education details: Discussed eval findings, rehab rationale and POC and patient is in agreement  Person educated: Patient Education method: Explanation Education comprehension: verbalized understanding and needs further education  HOME EXERCISE PROGRAM: Access Code: [email or web address] URL: [email or web address] Date: 08/12/2023 Prepared by: Gustavus Bryant  Exercises - Seated Long Arc Quad  - 2 x daily - 5 x weekly - 2 sets - 10 reps - 2s hold - Standing Heel Raise with Support  - 2 x daily - 5 x weekly - 2 sets - 10 reps - Standing Knee Flexion AROM  - 2 x daily - 5 x weekly - 2 sets - 10 reps  ASSESSMENT:  CLINICAL IMPRESSION: No increased resistance today due to elevated pain levels.  Goal of session was to maintain ROM and flexibility while promoting strengthening in available ROM.  2 MWT with cane deferred.  Increased reps as noted on new tasks.  Persistent cramping of R hamstring group limiting function.   Patient is a 72 y.o. female  who was seen today for physical therapy evaluation and treatment for chronic L knee pain and  subsequent hip/ankle weakness. Patient presents with good L knee ROM and strength but demonstrates weakness in L hip and ankle.  Initial 2 MWT time is functional and will compare using SPC at future visits.  Patient is a good candidate for OPPT.  OBJECTIVE IMPAIRMENTS: Abnormal gait, decreased activity tolerance, decreased endurance, decreased knowledge of condition, decreased mobility, difficulty walking, decreased ROM, decreased strength, and pain.   ACTIVITY LIMITATIONS: carrying, lifting, sitting, standing, squatting, stairs, and locomotion level  PARTICIPATION LIMITATIONS: community activity  PERSONAL FACTORS: Age, Fitness, Past/current experiences, Time since onset of injury/illness/exacerbation, and 1 comorbidity: severe L shoulder OA  are also affecting patient's functional outcome.   REHAB POTENTIAL: Good  CLINICAL DECISION MAKING: Stable/uncomplicated  EVALUATION COMPLEXITY: Low   GOALS: Goals reviewed with patient? No  SHORT TERM GOALS: Target date: 09/02/2023   Patient to demonstrate independence in HEP  Baseline: [id number] Goal status: INITIAL  2.  Assess 2 MWT with SPC and compare with RW results. Baseline: TBD Goal status: INITIAL   LONG TERM GOALS: Target date: 09/23/2023    Assess 2 MWT with cane  Baseline: TBD Goal status: INITIAL  2.  Increase LLE strength to 4+/5 Baseline:  MMT Right eval Left eval  Hip flexion  4  Hip extension  4  Hip abduction  4  Hip adduction    Hip internal rotation    Hip external rotation    Knee flexion  4-  Knee extension  4-  Ankle dorsiflexion    Ankle plantarflexion  4   Goal status: INITIAL  3.  Decrease pain to 4/10 at worst Baseline: 6-8/10 Goal status: INITIAL  4.  Increase FOTO score to 57 Baseline: 44 Goal status: INITIAL     PLAN:  PT FREQUENCY: 1-2x/week  PT DURATION: 8 weeks  PLANNED INTERVENTIONS: Therapeutic exercises, Therapeutic activity, Neuromuscular re-education, Balance training,  Gait training, Patient/Family education, Self Care, Joint mobilization, Stair training, Dry Needling, Electrical stimulation, Cryotherapy, Moist heat, Manual therapy, and Re-evaluation  PLAN FOR NEXT SESSION: HEP review and update, manual techniques as appropriate, aerobic tasks, ROM and flexibility activities, strengthening and PREs, TPDN, gait and balance training as needed     Hildred Laser, PT 08/29/2023, 1:07 PM

## 2023-08-26 ENCOUNTER — Ambulatory Visit: Payer: Self-pay

## 2023-08-26 NOTE — Patient Instructions (Signed)
Visit Information  Thank you for taking time to visit with me today. Please don't hesitate to contact me if I can be of assistance to you.   Following are the goals we discussed today:   Goals Addressed               This Visit's Progress     Diabetes Patient self management (pt-stated)        Patient Goals/Self-Care Activities:  Take medications as prescribed   Attend all scheduled provider appointments Call pharmacy for medication refills 3-7 days in advance of running out of medications Call provider office for new concerns or questions   Lab Results  Component Value Date   HGBA1C 7.0 08/17/2023    Reviewed medications with patient and discussed importance of medication adherence;        Counseled on importance of regular laboratory monitoring as prescribed;        Discussed plans with patient for ongoing care management follow up and provided patient with direct contact information for care management team;      Reviewed scheduled/upcoming provider appointments including     09/20/23  Review of patient status, including review of consultants reports, relevant laboratory and other test results, and medications completed;                Our next appointment is by telephone on 09/23/23 at 1130 am  Please call the care guide team at [phone] if you need to cancel or reschedule your appointment.   If you are experiencing a Mental Health or Behavioral Health Crisis or need someone to talk to, please call 1-800-273-TALK (toll free, 24 hour hotline)  Patient verbalizes understanding of instructions and care plan provided today and agrees to view in MyChart. Active MyChart status and patient understanding of how to access instructions and care plan via MyChart confirmed with patient.     Juanell Fairly RN, BSN, [facility] [employer] Phone: [phone]

## 2023-08-26 NOTE — Patient Outreach (Signed)
Care Coordination   Follow Up Visit Note   08/26/2023 Name: SKARLETT KLUG MRN: [id number] DOB: June 13, 1951  ALLICE SHIU is a 72 y.o. year old female who sees Chambliss, Estill Batten, MD for primary care. I spoke with  [name] by phone today.  What matters to the patients health and wellness today?  Mrs. Fryrear current condition is reportedly stable, with the exception of experiencing significant left shoulder pain, rated at 8/10. She is scheduled to undergo a left shoulder arthroscopy on 09/20/23. To manage the pain, Mrs. Materna is utilizing pain medications and topical creams. I recommended that she explore the application of heat as a potential method to alleviate the discomfort. Additionally, her A1c levels have shown improvement, decreasing from 7.3 to 7.0, and her current blood sugar level is 165.    Goals Addressed               This Visit's Progress     Diabetes Patient self management (pt-stated)        Patient Goals/Self-Care Activities:  Take medications as prescribed   Attend all scheduled provider appointments Call pharmacy for medication refills 3-7 days in advance of running out of medications Call provider office for new concerns or questions   Lab Results  Component Value Date   HGBA1C 7.0 08/17/2023    Reviewed medications with patient and discussed importance of medication adherence;        Counseled on importance of regular laboratory monitoring as prescribed;        Discussed plans with patient for ongoing care management follow up and provided patient with direct contact information for care management team;      Reviewed scheduled/upcoming provider appointments including     09/20/23  Review of patient status, including review of consultants reports, relevant laboratory and other test results, and medications completed;                SDOH assessments and interventions completed:  No     Care Coordination Interventions:  Yes, provided    . Interventions Today    Flowsheet Row Most Recent Value  Chronic Disease   Chronic disease during today's visit Diabetes  General Interventions   General Interventions Discussed/Reviewed General Interventions Discussed, Doctor Visits  Doctor Visits Discussed/Reviewed Doctor Visits Discussed, Specialist  Pharmacy Interventions   Pharmacy Dicussed/Reviewed Pharmacy Topics Discussed  Safety Interventions   Safety Discussed/Reviewed Safety Discussed        Follow up plan: Follow up call scheduled for 09/23/23  1130 am    Encounter Outcome:  Patient Visit Completed   Juanell Fairly RN, BSN, [facility]   Care Coordinator Phone: [phone]

## 2023-08-29 ENCOUNTER — Ambulatory Visit: Payer: 59

## 2023-08-29 DIAGNOSIS — M25562 Pain in left knee: Secondary | ICD-10-CM | POA: Diagnosis not present

## 2023-08-29 DIAGNOSIS — G8929 Other chronic pain: Secondary | ICD-10-CM

## 2023-08-29 DIAGNOSIS — M6281 Muscle weakness (generalized): Secondary | ICD-10-CM | POA: Diagnosis not present

## 2023-08-29 DIAGNOSIS — M25512 Pain in left shoulder: Secondary | ICD-10-CM | POA: Diagnosis not present

## 2023-08-29 DIAGNOSIS — R2689 Other abnormalities of gait and mobility: Secondary | ICD-10-CM

## 2023-08-29 NOTE — Therapy (Unsigned)
OUTPATIENT PHYSICAL THERAPY LOWER EXTREMITY EVALUATION   Patient Name: [name] MRN: [id number] DOB:11-Oct-1951, 72 y.o., female Today's Date: 08/29/2023  END OF SESSION:      Past Medical History:  Diagnosis Date   Anemia    Arthritis    Asthma    Blood transfusion without reported diagnosis    Patients believes 2015 when she overdosed on "medications"    Breast cancer (HCC)    Breast cancer of lower-outer quadrant of right female breast (HCC) 02/13/2015   Cataract    CHF, acute (HCC) 05/03/2012   Depression    Diabetes mellitus    Eczema    Fatty liver    Fatty infiltration of liver noted on 03/2012 CT scan   Fibromyalgia    Glaucoma    Heart disease    History of kidney stones    w/ hx of hydronephrosis - followed by [facility]   HIV nonspecific serology    2006: indeterminate HIV blood test, seen by ID, felt secondary to cross reacting antibodies with no further workup felt necessary at that time    Hyperlipidemia    Hypertension    Obesity    Personal history of radiation therapy 2016   right   Radiation 05/07/15-06/23/15   Right Breast   Past Surgical History:  Procedure Laterality Date   BREAST BIOPSY Left 02/10/2015   malignant    BREAST LUMPECTOMY Right 03/21/2015   CHOLECYSTECTOMY  2003   CYSTOSCOPY W/ LITHOLAPAXY / [facility]     JOINT REPLACEMENT     bilateral knee replacement   LEFT AND RIGHT HEART CATHETERIZATION WITH CORONARY ANGIOGRAM N/A 05/02/2012   Procedure: LEFT AND RIGHT HEART CATHETERIZATION WITH CORONARY ANGIOGRAM;  Surgeon: Kathleene Hazel, MD;  Location: [facility] CATH LAB;  Service: Cardiovascular;  Laterality: N/A;   RADIOACTIVE SEED GUIDED PARTIAL MASTECTOMY WITH AXILLARY SENTINEL LYMPH NODE BIOPSY Right 03/21/2015   Procedure: RIGHT  PARTIAL MASTECTOMY WITH RADIOACTIVE SEED LOCALIZATION RIGHT  AXILLARY SENTINEL LYMPH NODE BIOPSY;  Surgeon: Claud Kelp, MD;  Location: [facility];  Service: General;  Laterality: Right;    REPLACEMENT TOTAL KNEE BILATERAL  2005 &2006   VASCULAR SURGERY Right 03/15/2013   Ultrasound guided sclerotherapy   Patient Active Problem List   Diagnosis Date Noted   Renal disease due to diabetes mellitus (HCC) 04/28/2023   Left shoulder pain 09/06/2022   Left breast mass 11/11/2021   Tinea pedis 04/09/2020   Spinal stenosis of lumbar region without neurogenic claudication 02/08/2019   Gout 05/16/2018   Gait instability 01/03/2018   Tremor of both hands    Stuttering    Essential hypertension    Hyperlipidemia 08/13/2015   Breast cancer of lower-outer quadrant of right female breast (HCC) 02/13/2015   Type 2 diabetes mellitus with diabetic foot deformity (HCC) 01/01/2014   Chronic combined systolic and diastolic heart failure (HCC) 02/26/2013   OBESITY 02/05/2009   Asthma, intermittent 02/08/2008   Neuropathy due to secondary diabetes (HCC) 05/18/2007   NEPHROLITHIASIS 01/26/2007   DJD (degenerative joint disease), multiple sites 01/26/2007    PCP: Carney Living, MD   REFERRING PROVIDER: Tarry Kos, MD  REFERRING DIAG: [id number] (ICD-10-CM) - Chronic pain of left knee  THERAPY DIAG:  No diagnosis found.  Rationale for Evaluation and Treatment: Rehabilitation  ONSET DATE: chronic  SUBJECTIVE:   SUBJECTIVE STATEMENT: L knee pain 9/10 following episode of rainy weather  PERTINENT HISTORY: Plan: [name] is a 72 year old female with chronic left shoulder and  left knee pain.  For the shoulder she has bone-on-bone glenohumeral osteoarthritis.  We will make a referral to Dr. Steward Drone or August Saucer for surgical consultation.  If she is not interested in cortisone injections at this point as they have not worked in the past.  For the knee I do not see any problems with the implant.  I think her issue is lack of quad strength relative to her weight.  I will make a referral for physical therapy.  Follow-up as needed. PAIN:  Are you having pain? Yes: NPRS scale:  6-8/10 Pain location: L knee Pain description: ache Aggravating factors: prolonged activities and positioning Relieving factors: rest and position changes  PRECAUTIONS: None  RED FLAGS: None   WEIGHT BEARING RESTRICTIONS: No  FALLS:  Has patient fallen in last 6 months? No  OCCUPATION: retired  PLOF: Independent with basic ADLs  PATIENT GOALS: To reduce my knee symptoms  NEXT MD VISIT: PRN  OBJECTIVE:   DIAGNOSTIC FINDINGS: XR KNEE 3 VIEW LEFT   Result Date: 07/21/2023 X-rays demonstrate a total knee replacement with tibial baseplate in slight varus alignment.  No evidence of subsidence or loosening.   PATIENT SURVEYS:  FOTO 44(57 predicted)  MUSCLE LENGTH: Hamstrings: Left 70 deg  PALPATION: deferred  LOWER EXTREMITY ROM:  Active ROM Right eval Left eval  Hip flexion    Hip extension    Hip abduction    Hip adduction    Hip internal rotation    Hip external rotation    Knee flexion  114d  Knee extension  -2d  Ankle dorsiflexion    Ankle plantarflexion    Ankle inversion    Ankle eversion     (Blank rows = not tested)  LOWER EXTREMITY MMT:  MMT Right eval Left eval  Hip flexion  4  Hip extension  4  Hip abduction  4  Hip adduction    Hip internal rotation    Hip external rotation    Knee flexion  4-  Knee extension  4-  Ankle dorsiflexion    Ankle plantarflexion  4  Ankle inversion    Ankle eversion     (Blank rows = not tested)  FUNCTIONAL TESTS:  5x STS 17s 2 MWT 3108ft with RW  GAIT: Distance walked: 71ft x2 Assistive device utilized: Environmental consultant - 4 wheeled Level of assistance: Modified independence Comments: slow cadence   TODAY'S TREATMENT:    [facility] Adult PT Treatment:                                                DATE: 08/29/23 Therapeutic Exercise: Nustep L4 6 min FAQs L 2# 15x2 SAQs 2# 15x2 SLR L 2# 10x limited by RLE cramping L heel slides 2# 15x2 Supine march 2# LLE 15/15 Heel raises 15x Toe raises 15x Standing  hamstring curls     [facility] Adult PT Treatment:                                                DATE: 08/24/23 Therapeutic Exercise: Nustep L2 6 min FAQs L 2# 15x2 SAQs 2# 15x2 SLR L 15x, 11x due to RLE cramping Heel toe 15x2 Standing hamstring curls  DATE: 08/12/23 Eval and HEP    PATIENT EDUCATION:  Education details: Discussed eval findings, rehab rationale and POC and patient is in agreement  Person educated: Patient Education method: Explanation Education comprehension: verbalized understanding and needs further education  HOME EXERCISE PROGRAM: Access Code: [email or web address] URL: [email or web address] Date: 08/12/2023 Prepared by: Gustavus Bryant  Exercises - Seated Long Arc Quad  - 2 x daily - 5 x weekly - 2 sets - 10 reps - 2s hold - Standing Heel Raise with Support  - 2 x daily - 5 x weekly - 2 sets - 10 reps - Standing Knee Flexion AROM  - 2 x daily - 5 x weekly - 2 sets - 10 reps  ASSESSMENT:  CLINICAL IMPRESSION: No increased resistance today due to elevated pain levels.  Goal of session was to maintain ROM and flexibility while promoting strengthening in available ROM.  2 MWT with cane deferred.  Increased reps as noted on new tasks.  Persistent cramping of R hamstring group limiting function.   Patient is a 72 y.o. female  who was seen today for physical therapy evaluation and treatment for chronic L knee pain and subsequent hip/ankle weakness. Patient presents with good L knee ROM and strength but demonstrates weakness in L hip and ankle.  Initial 2 MWT time is functional and will compare using SPC at future visits.  Patient is a good candidate for OPPT.  OBJECTIVE IMPAIRMENTS: Abnormal gait, decreased activity tolerance, decreased endurance, decreased knowledge of condition, decreased mobility, difficulty walking, decreased ROM, decreased  strength, and pain.   ACTIVITY LIMITATIONS: carrying, lifting, sitting, standing, squatting, stairs, and locomotion level  PARTICIPATION LIMITATIONS: community activity  PERSONAL FACTORS: Age, Fitness, Past/current experiences, Time since onset of injury/illness/exacerbation, and 1 comorbidity: severe L shoulder OA  are also affecting patient's functional outcome.   REHAB POTENTIAL: Good  CLINICAL DECISION MAKING: Stable/uncomplicated  EVALUATION COMPLEXITY: Low   GOALS: Goals reviewed with patient? No  SHORT TERM GOALS: Target date: 09/02/2023   Patient to demonstrate independence in HEP  Baseline: [id number] Goal status: INITIAL  2.  Assess 2 MWT with SPC and compare with RW results. Baseline: TBD Goal status: INITIAL   LONG TERM GOALS: Target date: 09/23/2023    Assess 2 MWT with cane Baseline: TBD Goal status: INITIAL  2.  Increase LLE strength to 4+/5 Baseline:  MMT Right eval Left eval  Hip flexion  4  Hip extension  4  Hip abduction  4  Hip adduction    Hip internal rotation    Hip external rotation    Knee flexion  4-  Knee extension  4-  Ankle dorsiflexion    Ankle plantarflexion  4   Goal status: INITIAL  3.  Decrease pain to 4/10 at worst Baseline: 6-8/10 Goal status: INITIAL  4.  Increase FOTO score to 57 Baseline: 44 Goal status: INITIAL     PLAN:  PT FREQUENCY: 1-2x/week  PT DURATION: 8 weeks  PLANNED INTERVENTIONS: Therapeutic exercises, Therapeutic activity, Neuromuscular re-education, Balance training, Gait training, Patient/Family education, Self Care, Joint mobilization, Stair training, Dry Needling, Electrical stimulation, Cryotherapy, Moist heat, Manual therapy, and Re-evaluation  PLAN FOR NEXT SESSION: HEP review and update, manual techniques as appropriate, aerobic tasks, ROM and flexibility activities, strengthening and PREs, TPDN, gait and balance training as needed     Hildred Laser, PT 08/29/2023, 1:50 PM

## 2023-08-31 ENCOUNTER — Ambulatory Visit: Payer: 59 | Attending: Family Medicine

## 2023-08-31 DIAGNOSIS — M6281 Muscle weakness (generalized): Secondary | ICD-10-CM | POA: Diagnosis not present

## 2023-08-31 DIAGNOSIS — M25562 Pain in left knee: Secondary | ICD-10-CM | POA: Diagnosis not present

## 2023-08-31 DIAGNOSIS — R2689 Other abnormalities of gait and mobility: Secondary | ICD-10-CM | POA: Diagnosis not present

## 2023-08-31 DIAGNOSIS — G8929 Other chronic pain: Secondary | ICD-10-CM | POA: Insufficient documentation

## 2023-09-02 NOTE — Therapy (Unsigned)
OUTPATIENT PHYSICAL THERAPY DAILY NOTE   Patient Name: [name] MRN: [id number] DOB:05-01-1951, 72 y.o., female Today's Date: 09/05/2023  END OF SESSION:    09/05/23 1029  PT Visits / Re-Eval  Visit Number 5  Number of Visits 12  Date for PT Re-Evaluation 07/13/23  Authorization  Authorization Type UHC  PT Time Calculation  PT Start Time 1030  PT Stop Time 1115  PT Time Calculation (min) 45 min  PT - End of Session  Activity Tolerance Patient limited by pain  Behavior During Therapy [facility] for tasks assessed/performed       Past Medical History:  Diagnosis Date   Anemia    Arthritis    Asthma    Blood transfusion without reported diagnosis    Patients believes 2015 when she overdosed on "medications"    Breast cancer (HCC)    Breast cancer of lower-outer quadrant of right female breast (HCC) 02/13/2015   Cataract    CHF, acute (HCC) 05/03/2012   Depression    Diabetes mellitus    Eczema    Fatty liver    Fatty infiltration of liver noted on 03/2012 CT scan   Fibromyalgia    Glaucoma    Heart disease    History of kidney stones    w/ hx of hydronephrosis - followed by [facility]   HIV nonspecific serology    2006: indeterminate HIV blood test, seen by ID, felt secondary to cross reacting antibodies with no further workup felt necessary at that time    Hyperlipidemia    Hypertension    Obesity    Personal history of radiation therapy 2016   right   Radiation 05/07/15-06/23/15   Right Breast   Past Surgical History:  Procedure Laterality Date   BREAST BIOPSY Left 02/10/2015   malignant    BREAST LUMPECTOMY Right 03/21/2015   CHOLECYSTECTOMY  2003   CYSTOSCOPY W/ LITHOLAPAXY / EHL     JOINT REPLACEMENT     bilateral knee replacement   LEFT AND RIGHT HEART CATHETERIZATION WITH CORONARY ANGIOGRAM N/A 05/02/2012   Procedure: LEFT AND RIGHT HEART CATHETERIZATION WITH CORONARY ANGIOGRAM;  Surgeon: Kathleene Hazel, MD;  Location: [facility] CATH LAB;   Service: Cardiovascular;  Laterality: N/A;   RADIOACTIVE SEED GUIDED PARTIAL MASTECTOMY WITH AXILLARY SENTINEL LYMPH NODE BIOPSY Right 03/21/2015   Procedure: RIGHT  PARTIAL MASTECTOMY WITH RADIOACTIVE SEED LOCALIZATION RIGHT  AXILLARY SENTINEL LYMPH NODE BIOPSY;  Surgeon: Claud Kelp, MD;  Location: [facility];  Service: General;  Laterality: Right;   REPLACEMENT TOTAL KNEE BILATERAL  2005 &2006   VASCULAR SURGERY Right 03/15/2013   Ultrasound guided sclerotherapy   Patient Active Problem List   Diagnosis Date Noted   Renal disease due to diabetes mellitus (HCC) 04/28/2023   Left shoulder pain 09/06/2022   Left breast mass 11/11/2021   Tinea pedis 04/09/2020   Spinal stenosis of lumbar region without neurogenic claudication 02/08/2019   Gout 05/16/2018   Gait instability 01/03/2018   Tremor of both hands    Stuttering    Essential hypertension    Hyperlipidemia 08/13/2015   Breast cancer of lower-outer quadrant of right female breast (HCC) 02/13/2015   Type 2 diabetes mellitus with diabetic foot deformity (HCC) 01/01/2014   Chronic combined systolic and diastolic heart failure (HCC) 02/26/2013   OBESITY 02/05/2009   Asthma, intermittent 02/08/2008   Neuropathy due to secondary diabetes (HCC) 05/18/2007   NEPHROLITHIASIS 01/26/2007   DJD (degenerative joint disease), multiple sites 01/26/2007  PCP: Carney Living, MD   REFERRING PROVIDER: Tarry Kos, MD  REFERRING DIAG: [phone] (ICD-10-CM) - Chronic pain of left knee  THERAPY DIAG:  Chronic pain of left knee  Muscle weakness (generalized)  Other abnormalities of gait and mobility  Rationale for Evaluation and Treatment: Rehabilitation  ONSET DATE: chronic  SUBJECTIVE:   SUBJECTIVE STATEMENT: L knee symptoms unrelenting.  Symptoms affected by weather.  PERTINENT HISTORY: Plan: [name] is a 72 year old female with chronic left shoulder and left knee pain.  For the shoulder she has  bone-on-bone glenohumeral osteoarthritis.  We will make a referral to Dr. Steward Drone or August Saucer for surgical consultation.  If she is not interested in cortisone injections at this point as they have not worked in the past.  For the knee I do not see any problems with the implant.  I think her issue is lack of quad strength relative to her weight.  I will make a referral for physical therapy.  Follow-up as needed. PAIN:  Are you having pain? Yes: NPRS scale: 6-8/10 Pain location: L knee Pain description: ache Aggravating factors: prolonged activities and positioning Relieving factors: rest and position changes  PRECAUTIONS: None  RED FLAGS: None   WEIGHT BEARING RESTRICTIONS: No  FALLS:  Has patient fallen in last 6 months? No  OCCUPATION: retired  PLOF: Independent with basic ADLs  PATIENT GOALS: To reduce my knee symptoms  NEXT MD VISIT: PRN  OBJECTIVE:   DIAGNOSTIC FINDINGS: XR KNEE 3 VIEW LEFT   Result Date: 07/21/2023 X-rays demonstrate a total knee replacement with tibial baseplate in slight varus alignment.  No evidence of subsidence or loosening.   PATIENT SURVEYS:  FOTO 44(57 predicted)  MUSCLE LENGTH: Hamstrings: Left 70 deg  PALPATION: deferred  LOWER EXTREMITY ROM:  Active ROM Right eval Left eval  Hip flexion    Hip extension    Hip abduction    Hip adduction    Hip internal rotation    Hip external rotation    Knee flexion  114d  Knee extension  -2d  Ankle dorsiflexion    Ankle plantarflexion    Ankle inversion    Ankle eversion     (Blank rows = not tested)  LOWER EXTREMITY MMT:  MMT Right eval Left eval  Hip flexion  4  Hip extension  4  Hip abduction  4  Hip adduction    Hip internal rotation    Hip external rotation    Knee flexion  4-  Knee extension  4-  Ankle dorsiflexion    Ankle plantarflexion  4  Ankle inversion    Ankle eversion     (Blank rows = not tested)  FUNCTIONAL TESTS:  5x STS 17s 2 MWT 349ft with  RW  GAIT: Distance walked: 26ft x2 Assistive device utilized: Environmental consultant - 4 wheeled Level of assistance: Modified independence Comments: slow cadence   TODAY'S TREATMENT:    [facility] Adult PT Treatment:                                                DATE: 09/05/23 Therapeutic Exercise: Nustep L4 8 min FAQs L 4# 15x2 SAQs 4# 15x2 L heel slides 4# 15x2 supine slide board Heel raises 15x Toe raises 15x Standing hamstring curls 15x2 2# Step ups 4in 10x with countertop support TKEs GTB 15x2  [facility] Adult PT Treatment:  DATE: 08/31/23 Therapeutic Exercise: Nustep L4 6 min FAQs L 3# 15x2 SAQs 3# 15x2 L heel slides 3# 15x2 supine slide board Heel raises 15x Toe raises 15x Standing hamstring curls 15x 2 MWT with SPC 248ft  [facility] Adult PT Treatment:                                                DATE: 08/29/23 Therapeutic Exercise: Nustep L4 6 min FAQs L 2# 15x2 SAQs 2# 15x2 SLR L 2# 10x limited by RLE cramping L heel slides 2# 15x2 Supine march 2# LLE 15/15 Heel raises 15x Toe raises 15x Standing hamstring curls     [facility] Adult PT Treatment:                                                DATE: 08/24/23 Therapeutic Exercise: Nustep L2 6 min FAQs L 2# 15x2 SAQs 2# 15x2 SLR L 15x, 11x due to RLE cramping Heel toe 15x2 Standing hamstring curls                                                                                                                          DATE: 08/12/23 Eval and HEP    PATIENT EDUCATION:  Education details: Discussed eval findings, rehab rationale and POC and patient is in agreement  Person educated: Patient Education method: Explanation Education comprehension: verbalized understanding and needs further education  HOME EXERCISE PROGRAM: Access Code: [email or web address] URL: [email or web address] Date: 08/12/2023 Prepared by: Gustavus Bryant  Exercises - Seated Long Arc Quad  - 2 x daily - 5 x weekly - 2  sets - 10 reps - 2s hold - Standing Heel Raise with Support  - 2 x daily - 5 x weekly - 2 sets - 10 reps - Standing Knee Flexion AROM  - 2 x daily - 5 x weekly - 2 sets - 10 reps  ASSESSMENT:  CLINICAL IMPRESSION: Increased time on aerobic tasks and added resistance as noted.  Advanced to step ups and TKEs.  Several mild LOB episodes observed in session attributed to R knee instability.  Patient cautioned to be wary of mis steps.  Able to advance to increase weight and resistance w/o setback.   Patient is a 72 y.o. female  who was seen today for physical therapy evaluation and treatment for chronic L knee pain and subsequent hip/ankle weakness. Patient presents with good L knee ROM and strength but demonstrates weakness in L hip and ankle.  Initial 2 MWT time is functional and will compare using SPC at future visits.  Patient is a good candidate for OPPT.  OBJECTIVE IMPAIRMENTS: Abnormal gait, decreased activity tolerance, decreased endurance, decreased knowledge of condition, decreased mobility, difficulty walking,  decreased ROM, decreased strength, and pain.   ACTIVITY LIMITATIONS: carrying, lifting, sitting, standing, squatting, stairs, and locomotion level  PARTICIPATION LIMITATIONS: community activity  PERSONAL FACTORS: Age, Fitness, Past/current experiences, Time since onset of injury/illness/exacerbation, and 1 comorbidity: severe L shoulder OA  are also affecting patient's functional outcome.   REHAB POTENTIAL: Good  CLINICAL DECISION MAKING: Stable/uncomplicated  EVALUATION COMPLEXITY: Low   GOALS: Goals reviewed with patient? No  SHORT TERM GOALS: Target date: 09/02/2023   Patient to demonstrate independence in HEP  Baseline: [id number] Goal status: Met  2.  Assess 2 MWT with SPC and compare with RW results. Baseline: TBD; 08/31/23 264ft with SPC Goal status: Met   LONG TERM GOALS: Target date: 09/23/2023    Assess 2 MWT with cane Baseline: TBD; 08/31/23 237ft with  SPC Goal status: Met  2.  Increase LLE strength to 4+/5 Baseline:  MMT Right eval Left eval  Hip flexion  4  Hip extension  4  Hip abduction  4  Hip adduction    Hip internal rotation    Hip external rotation    Knee flexion  4-  Knee extension  4-  Ankle dorsiflexion    Ankle plantarflexion  4   Goal status: INITIAL  3.  Decrease pain to 4/10 at worst Baseline: 6-8/10 Goal status: INITIAL  4.  Increase FOTO score to 57 Baseline: 44 Goal status: INITIAL     PLAN:  PT FREQUENCY: 1-2x/week  PT DURATION: 8 weeks  PLANNED INTERVENTIONS: Therapeutic exercises, Therapeutic activity, Neuromuscular re-education, Balance training, Gait training, Patient/Family education, Self Care, Joint mobilization, Stair training, Dry Needling, Electrical stimulation, Cryotherapy, Moist heat, Manual therapy, and Re-evaluation  PLAN FOR NEXT SESSION: HEP review and update, manual techniques as appropriate, aerobic tasks, ROM and flexibility activities, strengthening and PREs, TPDN, gait and balance training as needed     Hildred Laser, PT 09/05/2023, 11:19 AM

## 2023-09-05 ENCOUNTER — Ambulatory Visit: Payer: 59

## 2023-09-05 DIAGNOSIS — G8929 Other chronic pain: Secondary | ICD-10-CM | POA: Diagnosis not present

## 2023-09-05 DIAGNOSIS — R2689 Other abnormalities of gait and mobility: Secondary | ICD-10-CM

## 2023-09-05 DIAGNOSIS — M6281 Muscle weakness (generalized): Secondary | ICD-10-CM | POA: Diagnosis not present

## 2023-09-05 DIAGNOSIS — M25562 Pain in left knee: Secondary | ICD-10-CM | POA: Diagnosis not present

## 2023-09-05 NOTE — Therapy (Unsigned)
OUTPATIENT PHYSICAL THERAPY DAILY NOTE   Patient Name: [name] MRN: [id number] DOB:1951-03-03, 72 y.o., female Today's Date: 09/07/2023  END OF SESSION:    09/07/23 0928  PT Visits / Re-Eval  Visit Number 6  Number of Visits 12  Date for PT Re-Evaluation 07/13/23  Authorization  Authorization Type UHC  PT Time Calculation  PT Start Time 0915  PT Stop Time 1000  PT Time Calculation (min) 45 min  PT - End of Session  Activity Tolerance Patient limited by pain  Behavior During Therapy [facility] for tasks assessed/performed       Past Medical History:  Diagnosis Date   Anemia    Arthritis    Asthma    Blood transfusion without reported diagnosis    Patients believes 2015 when she overdosed on "medications"    Breast cancer (HCC)    Breast cancer of lower-outer quadrant of right female breast (HCC) 02/13/2015   Cataract    CHF, acute (HCC) 05/03/2012   Depression    Diabetes mellitus    Eczema    Fatty liver    Fatty infiltration of liver noted on 03/2012 CT scan   Fibromyalgia    Glaucoma    Heart disease    History of kidney stones    w/ hx of hydronephrosis - followed by [facility]   HIV nonspecific serology    2006: indeterminate HIV blood test, seen by ID, felt secondary to cross reacting antibodies with no further workup felt necessary at that time    Hyperlipidemia    Hypertension    Obesity    Personal history of radiation therapy 2016   right   Radiation 05/07/15-06/23/15   Right Breast   Past Surgical History:  Procedure Laterality Date   BREAST BIOPSY Left 02/10/2015   malignant    BREAST LUMPECTOMY Right 03/21/2015   CHOLECYSTECTOMY  2003   CYSTOSCOPY W/ LITHOLAPAXY / EHL     JOINT REPLACEMENT     bilateral knee replacement   LEFT AND RIGHT HEART CATHETERIZATION WITH CORONARY ANGIOGRAM N/A 05/02/2012   Procedure: LEFT AND RIGHT HEART CATHETERIZATION WITH CORONARY ANGIOGRAM;  Surgeon: Kathleene Hazel, MD;  Location: [facility] CATH LAB;   Service: Cardiovascular;  Laterality: N/A;   RADIOACTIVE SEED GUIDED PARTIAL MASTECTOMY WITH AXILLARY SENTINEL LYMPH NODE BIOPSY Right 03/21/2015   Procedure: RIGHT  PARTIAL MASTECTOMY WITH RADIOACTIVE SEED LOCALIZATION RIGHT  AXILLARY SENTINEL LYMPH NODE BIOPSY;  Surgeon: Claud Kelp, MD;  Location: [facility];  Service: General;  Laterality: Right;   REPLACEMENT TOTAL KNEE BILATERAL  2005 &2006   VASCULAR SURGERY Right 03/15/2013   Ultrasound guided sclerotherapy   Patient Active Problem List   Diagnosis Date Noted   Renal disease due to diabetes mellitus (HCC) 04/28/2023   Left shoulder pain 09/06/2022   Left breast mass 11/11/2021   Tinea pedis 04/09/2020   Spinal stenosis of lumbar region without neurogenic claudication 02/08/2019   Gout 05/16/2018   Gait instability 01/03/2018   Tremor of both hands    Stuttering    Essential hypertension    Hyperlipidemia 08/13/2015   Breast cancer of lower-outer quadrant of right female breast (HCC) 02/13/2015   Type 2 diabetes mellitus with diabetic foot deformity (HCC) 01/01/2014   Chronic combined systolic and diastolic heart failure (HCC) 02/26/2013   OBESITY 02/05/2009   Asthma, intermittent 02/08/2008   Neuropathy due to secondary diabetes (HCC) 05/18/2007   NEPHROLITHIASIS 01/26/2007   DJD (degenerative joint disease), multiple sites 01/26/2007  PCP: Carney Living, MD   REFERRING PROVIDER: Tarry Kos, MD  REFERRING DIAG: [phone] (ICD-10-CM) - Chronic pain of left knee  THERAPY DIAG:  Chronic pain of left knee  Other abnormalities of gait and mobility  Muscle weakness (generalized)  Rationale for Evaluation and Treatment: Rehabilitation  ONSET DATE: chronic  SUBJECTIVE:   SUBJECTIVE STATEMENT: Stiff today due to colder weather this morning.  R knee instability issues have stabilized since last session and has been performing HEP on B knees.  Requests to reman at current resistance  level to accommodate difficulty.   PERTINENT HISTORY: Plan: [name] is a 72 year old female with chronic left shoulder and left knee pain.  For the shoulder she has bone-on-bone glenohumeral osteoarthritis.  We will make a referral to Dr. Steward Drone or August Saucer for surgical consultation.  If she is not interested in cortisone injections at this point as they have not worked in the past.  For the knee I do not see any problems with the implant.  I think her issue is lack of quad strength relative to her weight.  I will make a referral for physical therapy.  Follow-up as needed. PAIN:  Are you having pain? Yes: NPRS scale: 6-8/10 Pain location: L knee Pain description: ache Aggravating factors: prolonged activities and positioning Relieving factors: rest and position changes  PRECAUTIONS: None  RED FLAGS: None   WEIGHT BEARING RESTRICTIONS: No  FALLS:  Has patient fallen in last 6 months? No  OCCUPATION: retired  PLOF: Independent with basic ADLs  PATIENT GOALS: To reduce my knee symptoms  NEXT MD VISIT: PRN  OBJECTIVE:   DIAGNOSTIC FINDINGS: XR KNEE 3 VIEW LEFT   Result Date: 07/21/2023 X-rays demonstrate a total knee replacement with tibial baseplate in slight varus alignment.  No evidence of subsidence or loosening.   PATIENT SURVEYS:  FOTO 44(57 predicted)  MUSCLE LENGTH: Hamstrings: Left 70 deg  PALPATION: deferred  LOWER EXTREMITY ROM:  Active ROM Right eval Left eval  Hip flexion    Hip extension    Hip abduction    Hip adduction    Hip internal rotation    Hip external rotation    Knee flexion  114d  Knee extension  -2d  Ankle dorsiflexion    Ankle plantarflexion    Ankle inversion    Ankle eversion     (Blank rows = not tested)  LOWER EXTREMITY MMT:  MMT Right eval Left eval  Hip flexion  4  Hip extension  4  Hip abduction  4  Hip adduction    Hip internal rotation    Hip external rotation    Knee flexion  4-  Knee extension  4-  Ankle  dorsiflexion    Ankle plantarflexion  4  Ankle inversion    Ankle eversion     (Blank rows = not tested)  FUNCTIONAL TESTS:  5x STS 17s 2 MWT 366ft with RW  GAIT: Distance walked: 71ft x2 Assistive device utilized: Environmental consultant - 4 wheeled Level of assistance: Modified independence Comments: slow cadence   TODAY'S TREATMENT:    [facility] Adult PT Treatment:                                                DATE: 09/07/23 Therapeutic Exercise: Nustep L4 8 min FAQs L 4# 15x2 SAQs 4# 15x2 L heel slides 4# 15x2  supine slide board Heel raises 15x Toe raises 15x Standing hamstring curls 15x2 2# Step ups 4in 10x with countertop support L only TKEs GTB 15x2  [facility] Adult PT Treatment:                                                DATE: 09/05/23 Therapeutic Exercise: Nustep L4 8 min FAQs L 4# 15x2 SAQs 4# 15x2 L heel slides 4# 15x2 supine slide board Heel raises 15x Toe raises 15x Standing hamstring curls 15x2 2# Step ups 4in 10x with countertop support TKEs GTB 15x2  [facility] Adult PT Treatment:                                                DATE: 08/31/23 Therapeutic Exercise: Nustep L4 6 min FAQs L 3# 15x2 SAQs 3# 15x2 L heel slides 3# 15x2 supine slide board Heel raises 15x Toe raises 15x Standing hamstring curls 15x 2 MWT with SPC 283ft  [facility] Adult PT Treatment:                                                DATE: 08/29/23 Therapeutic Exercise: Nustep L4 6 min FAQs L 2# 15x2 SAQs 2# 15x2 SLR L 2# 10x limited by RLE cramping L heel slides 2# 15x2 Supine march 2# LLE 15/15 Heel raises 15x Toe raises 15x Standing hamstring curls     [facility] Adult PT Treatment:                                                DATE: 08/24/23 Therapeutic Exercise: Nustep L2 6 min FAQs L 2# 15x2 SAQs 2# 15x2 SLR L 15x, 11x due to RLE cramping Heel toe 15x2 Standing hamstring curls                                                                                                                           DATE: 08/12/23 Eval and HEP    PATIENT EDUCATION:  Education details: Discussed eval findings, rehab rationale and POC and patient is in agreement  Person educated: Patient Education method: Explanation Education comprehension: verbalized understanding and needs further education  HOME EXERCISE PROGRAM: Access Code: [email or web address] URL: [email or web address] Date: 08/12/2023 Prepared by: Gustavus Bryant  Exercises - Seated Long Arc Quad  - 2 x daily - 5 x weekly - 2 sets - 10 reps - 2s hold - Standing Heel Raise with Support  -  2 x daily - 5 x weekly - 2 sets - 10 reps - Standing Knee Flexion AROM  - 2 x daily - 5 x weekly - 2 sets - 10 reps  ASSESSMENT:  CLINICAL IMPRESSION: Minimal episodes of R knee instability issues today.  Remained at same weight and resistance.  Able to complete all tasks w/o pain or adverse effects.   Patient is a 72 y.o. female  who was seen today for physical therapy evaluation and treatment for chronic L knee pain and subsequent hip/ankle weakness. Patient presents with good L knee ROM and strength but demonstrates weakness in L hip and ankle.  Initial 2 MWT time is functional and will compare using SPC at future visits.  Patient is a good candidate for OPPT.  OBJECTIVE IMPAIRMENTS: Abnormal gait, decreased activity tolerance, decreased endurance, decreased knowledge of condition, decreased mobility, difficulty walking, decreased ROM, decreased strength, and pain.   ACTIVITY LIMITATIONS: carrying, lifting, sitting, standing, squatting, stairs, and locomotion level  PARTICIPATION LIMITATIONS: community activity  PERSONAL FACTORS: Age, Fitness, Past/current experiences, Time since onset of injury/illness/exacerbation, and 1 comorbidity: severe L shoulder OA  are also affecting patient's functional outcome.   REHAB POTENTIAL: Good  CLINICAL DECISION MAKING: Stable/uncomplicated  EVALUATION COMPLEXITY: Low   GOALS: Goals reviewed with patient?  No  SHORT TERM GOALS: Target date: 09/02/2023   Patient to demonstrate independence in HEP  Baseline: [id number] Goal status: Met  2.  Assess 2 MWT with SPC and compare with RW results. Baseline: TBD; 08/31/23 232ft with SPC Goal status: Met   LONG TERM GOALS: Target date: 09/23/2023    Assess 2 MWT with cane Baseline: TBD; 08/31/23 27ft with SPC Goal status: Met  2.  Increase LLE strength to 4+/5 Baseline:  MMT Right eval Left eval  Hip flexion  4  Hip extension  4  Hip abduction  4  Hip adduction    Hip internal rotation    Hip external rotation    Knee flexion  4-  Knee extension  4-  Ankle dorsiflexion    Ankle plantarflexion  4   Goal status: INITIAL  3.  Decrease pain to 4/10 at worst Baseline: 6-8/10 Goal status: INITIAL  4.  Increase FOTO score to 57 Baseline: 44 Goal status: INITIAL     PLAN:  PT FREQUENCY: 1-2x/week  PT DURATION: 8 weeks  PLANNED INTERVENTIONS: Therapeutic exercises, Therapeutic activity, Neuromuscular re-education, Balance training, Gait training, Patient/Family education, Self Care, Joint mobilization, Stair training, Dry Needling, Electrical stimulation, Cryotherapy, Moist heat, Manual therapy, and Re-evaluation  PLAN FOR NEXT SESSION: HEP review and update, manual techniques as appropriate, aerobic tasks, ROM and flexibility activities, strengthening and PREs, TPDN, gait and balance training as needed     Hildred Laser, PT 09/07/2023, 9:59 AM

## 2023-09-07 ENCOUNTER — Other Ambulatory Visit: Payer: Self-pay | Admitting: Family Medicine

## 2023-09-07 ENCOUNTER — Ambulatory Visit: Payer: 59

## 2023-09-07 DIAGNOSIS — M6281 Muscle weakness (generalized): Secondary | ICD-10-CM

## 2023-09-07 DIAGNOSIS — I10 Essential (primary) hypertension: Secondary | ICD-10-CM

## 2023-09-07 DIAGNOSIS — R2689 Other abnormalities of gait and mobility: Secondary | ICD-10-CM

## 2023-09-07 DIAGNOSIS — G8929 Other chronic pain: Secondary | ICD-10-CM

## 2023-09-07 DIAGNOSIS — M25562 Pain in left knee: Secondary | ICD-10-CM | POA: Diagnosis not present

## 2023-09-09 NOTE — Therapy (Signed)
OUTPATIENT PHYSICAL THERAPY DAILY NOTE   Patient Name: [name] MRN: [id number] DOB:[birth year], 72 y.o., female Today's Date: 09/12/2023  END OF SESSION:    09/12/23 0917  PT Visits / Re-Eval  Visit Number 7  Number of Visits 12  Date for PT Re-Evaluation 10/12/23  PT Time Calculation  PT Start Time 0915  PT Stop Time 1000  PT Time Calculation (min) 45 min  PT - End of Session  Activity Tolerance Patient limited by pain  Behavior During Therapy [facility] for tasks assessed/performed     Past Medical History:  Diagnosis Date   Anemia    Arthritis    Asthma    Blood transfusion without reported diagnosis    Patients believes 2015 when she overdosed on "medications"    Breast cancer (HCC)    Breast cancer of lower-outer quadrant of right female breast (HCC) 02/13/2015   Cataract    CHF, acute (HCC) 05/03/2012   Depression    Diabetes mellitus    Eczema    Fatty liver    Fatty infiltration of liver noted on 03/2012 CT scan   Fibromyalgia    Glaucoma    Heart disease    History of kidney stones    w/ hx of hydronephrosis - followed by [facility]   HIV nonspecific serology    2006: indeterminate HIV blood test, seen by ID, felt secondary to cross reacting antibodies with no further workup felt necessary at that time    Hyperlipidemia    Hypertension    Obesity    Personal history of radiation therapy 2016   right   Radiation 05/07/15-06/23/15   Right Breast   Past Surgical History:  Procedure Laterality Date   BREAST BIOPSY Left 02/10/2015   malignant    BREAST LUMPECTOMY Right 03/21/2015   CHOLECYSTECTOMY  2003   CYSTOSCOPY W/ LITHOLAPAXY / EHL     JOINT REPLACEMENT     bilateral knee replacement   LEFT AND RIGHT HEART CATHETERIZATION WITH CORONARY ANGIOGRAM N/A 05/02/2012   Procedure: LEFT AND RIGHT HEART CATHETERIZATION WITH CORONARY ANGIOGRAM;  Surgeon: Kathleene Hazel, MD;  Location: [facility] CATH LAB;  Service: Cardiovascular;  Laterality: N/A;    RADIOACTIVE SEED GUIDED PARTIAL MASTECTOMY WITH AXILLARY SENTINEL LYMPH NODE BIOPSY Right 03/21/2015   Procedure: RIGHT  PARTIAL MASTECTOMY WITH RADIOACTIVE SEED LOCALIZATION RIGHT  AXILLARY SENTINEL LYMPH NODE BIOPSY;  Surgeon: Claud Kelp, MD;  Location: [facility];  Service: General;  Laterality: Right;   REPLACEMENT TOTAL KNEE BILATERAL  2005 &2006   VASCULAR SURGERY Right 03/15/2013   Ultrasound guided sclerotherapy   Patient Active Problem List   Diagnosis Date Noted   Renal disease due to diabetes mellitus (HCC) 04/28/2023   Left shoulder pain 09/06/2022   Left breast mass 11/11/2021   Tinea pedis 04/09/2020   Spinal stenosis of lumbar region without neurogenic claudication 02/08/2019   Gout 05/16/2018   Gait instability 01/03/2018   Tremor of both hands    Stuttering    Essential hypertension    Hyperlipidemia 08/13/2015   Breast cancer of lower-outer quadrant of right female breast (HCC) 02/13/2015   Type 2 diabetes mellitus with diabetic foot deformity (HCC) 01/01/2014   Chronic combined systolic and diastolic heart failure (HCC) 02/26/2013   OBESITY 02/05/2009   Asthma, intermittent 02/08/2008   Neuropathy due to secondary diabetes (HCC) 05/18/2007   NEPHROLITHIASIS 01/26/2007   DJD (degenerative joint disease), multiple sites 01/26/2007    PCP: Carney Living, MD  REFERRING PROVIDER: Tarry Kos, MD  REFERRING DIAG: [phone] (ICD-10-CM) - Chronic pain of left knee  THERAPY DIAG:  Chronic pain of left knee  Other abnormalities of gait and mobility  Muscle weakness (generalized)  Rationale for Evaluation and Treatment: Rehabilitation  ONSET DATE: chronic  SUBJECTIVE:   SUBJECTIVE STATEMENT: Both knees feel better today.  Symptoms rated at 5/10 n intensity which seems to be her lowest level.  Feels she can attempt more resistance today  PERTINENT HISTORY: Plan: [name] is a 72 year old female with chronic left shoulder and left  knee pain.  For the shoulder she has bone-on-bone glenohumeral osteoarthritis.  We will make a referral to Dr. Steward Drone or August Saucer for surgical consultation.  If she is not interested in cortisone injections at this point as they have not worked in the past.  For the knee I do not see any problems with the implant.  I think her issue is lack of quad strength relative to her weight.  I will make a referral for physical therapy.  Follow-up as needed. PAIN:  Are you having pain? Yes: NPRS scale: 6-8/10 Pain location: L knee Pain description: ache Aggravating factors: prolonged activities and positioning Relieving factors: rest and position changes  PRECAUTIONS: None  RED FLAGS: None   WEIGHT BEARING RESTRICTIONS: No  FALLS:  Has patient fallen in last 6 months? No  OCCUPATION: retired  PLOF: Independent with basic ADLs  PATIENT GOALS: To reduce my knee symptoms  NEXT MD VISIT: PRN  OBJECTIVE:   DIAGNOSTIC FINDINGS: XR KNEE 3 VIEW LEFT   Result Date: 07/21/2023 X-rays demonstrate a total knee replacement with tibial baseplate in slight varus alignment.  No evidence of subsidence or loosening.   PATIENT SURVEYS:  FOTO 44(57 predicted)  MUSCLE LENGTH: Hamstrings: Left 70 deg  PALPATION: deferred  LOWER EXTREMITY ROM:  Active ROM Right eval Left eval  Hip flexion    Hip extension    Hip abduction    Hip adduction    Hip internal rotation    Hip external rotation    Knee flexion  114d  Knee extension  -2d  Ankle dorsiflexion    Ankle plantarflexion    Ankle inversion    Ankle eversion     (Blank rows = not tested)  LOWER EXTREMITY MMT:  MMT Right eval Left eval  Hip flexion  4  Hip extension  4  Hip abduction  4  Hip adduction    Hip internal rotation    Hip external rotation    Knee flexion  4-  Knee extension  4-  Ankle dorsiflexion    Ankle plantarflexion  4  Ankle inversion    Ankle eversion     (Blank rows = not tested)  FUNCTIONAL TESTS:  5x  STS 17s 2 MWT 365ft with RW  GAIT: Distance walked: 42ft x2 Assistive device utilized: Environmental consultant - 4 wheeled Level of assistance: Modified independence Comments: slow cadence   TODAY'S TREATMENT:    [facility] Adult PT Treatment:                                                DATE: 09/12/23 Therapeutic Exercise: Nustep L5 8 min FAQs L 5# 15x2 SAQs 5# 15x2 L heel slides 5# 15x2 supine slide board Heel raises 15x2 Toe raises 15x2 Standing hamstring curls 15x2 2# Step ups 4in 10x  with countertop support L only TKEs BluTB 15x2  [facility] Adult PT Treatment:                                                DATE: 09/07/23 Therapeutic Exercise: Nustep L4 8 min FAQs L 4# 15x2 SAQs 4# 15x2 L heel slides 4# 15x2 supine slide board Heel raises 15x Toe raises 15x Standing hamstring curls 15x2 2# Step ups 4in 10x with countertop support L only TKEs GTB 15x2  [facility] Adult PT Treatment:                                                DATE: 09/05/23 Therapeutic Exercise: Nustep L4 8 min FAQs L 4# 15x2 SAQs 4# 15x2 L heel slides 4# 15x2 supine slide board Heel raises 15x Toe raises 15x Standing hamstring curls 15x2 2# Step ups 4in 10x with countertop support TKEs GTB 15x2  [facility] Adult PT Treatment:                                                DATE: 08/31/23 Therapeutic Exercise: Nustep L4 6 min FAQs L 3# 15x2 SAQs 3# 15x2 L heel slides 3# 15x2 supine slide board Heel raises 15x Toe raises 15x Standing hamstring curls 15x 2 MWT with SPC 260ft  [facility] Adult PT Treatment:                                                DATE: 08/29/23 Therapeutic Exercise: Nustep L4 6 min FAQs L 2# 15x2 SAQs 2# 15x2 SLR L 2# 10x limited by RLE cramping L heel slides 2# 15x2 Supine march 2# LLE 15/15 Heel raises 15x Toe raises 15x Standing hamstring curls     [facility] Adult PT Treatment:                                                DATE: 08/24/23 Therapeutic Exercise: Nustep L2 6 min FAQs L 2# 15x2 SAQs 2#  15x2 SLR L 15x, 11x due to RLE cramping Heel toe 15x2 Standing hamstring curls                                                                                                                          DATE: 08/12/23 Eval and HEP  PATIENT EDUCATION:  Education details: Discussed eval findings, rehab rationale and POC and patient is in agreement  Person educated: Patient Education method: Explanation Education comprehension: verbalized understanding and needs further education  HOME EXERCISE PROGRAM: Access Code: [email or web address] URL: [email or web address] Date: 08/12/2023 Prepared by: Gustavus Bryant  Exercises - Seated Long Arc Quad  - 2 x daily - 5 x weekly - 2 sets - 10 reps - 2s hold - Standing Heel Raise with Support  - 2 x daily - 5 x weekly - 2 sets - 10 reps - Standing Knee Flexion AROM  - 2 x daily - 5 x weekly - 2 sets - 10 reps  ASSESSMENT:  CLINICAL IMPRESSION: Able to advance to 5# today for tasks.  Ambulation more stable as R knee symptoms less.  Increased resistance and reps as noted to focus on strength and endurance.   Patient is a 72 y.o. female  who was seen today for physical therapy evaluation and treatment for chronic L knee pain and subsequent hip/ankle weakness. Patient presents with good L knee ROM and strength but demonstrates weakness in L hip and ankle.  Initial 2 MWT time is functional and will compare using SPC at future visits.  Patient is a good candidate for OPPT.  OBJECTIVE IMPAIRMENTS: Abnormal gait, decreased activity tolerance, decreased endurance, decreased knowledge of condition, decreased mobility, difficulty walking, decreased ROM, decreased strength, and pain.   ACTIVITY LIMITATIONS: carrying, lifting, sitting, standing, squatting, stairs, and locomotion level  PARTICIPATION LIMITATIONS: community activity  PERSONAL FACTORS: Age, Fitness, Past/current experiences, Time since onset of injury/illness/exacerbation, and 1 comorbidity:  severe L shoulder OA  are also affecting patient's functional outcome.   REHAB POTENTIAL: Good  CLINICAL DECISION MAKING: Stable/uncomplicated  EVALUATION COMPLEXITY: Low   GOALS: Goals reviewed with patient? No  SHORT TERM GOALS: Target date: 09/02/2023   Patient to demonstrate independence in HEP  Baseline: [id number] Goal status: Met  2.  Assess 2 MWT with SPC and compare with RW results. Baseline: TBD; 08/31/23 270ft with SPC Goal status: Met   LONG TERM GOALS: Target date: 09/23/2023    Assess 2 MWT with cane Baseline: TBD; 08/31/23 276ft with SPC Goal status: Met  2.  Increase LLE strength to 4+/5 Baseline:  MMT Right eval Left eval  Hip flexion  4  Hip extension  4  Hip abduction  4  Hip adduction    Hip internal rotation    Hip external rotation    Knee flexion  4-  Knee extension  4-  Ankle dorsiflexion    Ankle plantarflexion  4   Goal status: INITIAL  3.  Decrease pain to 4/10 at worst Baseline: 6-8/10 Goal status: INITIAL  4.  Increase FOTO score to 57 Baseline: 44 Goal status: INITIAL     PLAN:  PT FREQUENCY: 1-2x/week  PT DURATION: 8 weeks  PLANNED INTERVENTIONS: Therapeutic exercises, Therapeutic activity, Neuromuscular re-education, Balance training, Gait training, Patient/Family education, Self Care, Joint mobilization, Stair training, Dry Needling, Electrical stimulation, Cryotherapy, Moist heat, Manual therapy, and Re-evaluation  PLAN FOR NEXT SESSION: HEP review and update, manual techniques as appropriate, aerobic tasks, ROM and flexibility activities, strengthening and PREs, TPDN, gait and balance training as needed     Hildred Laser, PT 09/12/2023, 9:47 AM

## 2023-09-12 ENCOUNTER — Ambulatory Visit: Payer: 59

## 2023-09-12 DIAGNOSIS — G8929 Other chronic pain: Secondary | ICD-10-CM | POA: Diagnosis not present

## 2023-09-12 DIAGNOSIS — M25562 Pain in left knee: Secondary | ICD-10-CM | POA: Diagnosis not present

## 2023-09-12 DIAGNOSIS — M6281 Muscle weakness (generalized): Secondary | ICD-10-CM | POA: Diagnosis not present

## 2023-09-12 DIAGNOSIS — R2689 Other abnormalities of gait and mobility: Secondary | ICD-10-CM

## 2023-09-12 NOTE — Therapy (Unsigned)
OUTPATIENT PHYSICAL THERAPY DAILY NOTE/DISCHARGE SUMMARY   Patient Name: [name] MRN: [id number] DOB:11-Feb-1951, 72 y.o., female Today's Date: 09/14/2023 PHYSICAL THERAPY DISCHARGE SUMMARY  Visits from Start of Care: 8  Current functional level related to goals / functional outcomes: Goals partially met   Remaining deficits: weakness   Education / Equipment: HEP   Patient agrees to discharge. Patient goals were partially met. Patient is being discharged due to a change in medical status.  END OF SESSION:    09/14/23 1005  PT Visits / Re-Eval  Visit Number 8  Number of Visits 12  Date for PT Re-Evaluation 10/12/23  Authorization  Authorization Type [employer]  PT Time Calculation  PT Start Time 1000  PT Stop Time 1040  PT Time Calculation (min) 40 min  PT - End of Session  Behavior During Therapy WFL for tasks assessed/performed      Past Medical History:  Diagnosis Date   Anemia    Arthritis    Asthma    Blood transfusion without reported diagnosis    Patients believes 2015 when she overdosed on "medications"    Breast cancer (HCC)    Breast cancer of lower-outer quadrant of right female breast (HCC) 02/13/2015   Cataract    CHF, acute (HCC) 05/03/2012   Depression    Diabetes mellitus    Eczema    Fatty liver    Fatty infiltration of liver noted on 03/2012 CT scan   Fibromyalgia    Glaucoma    Heart disease    History of kidney stones    w/ hx of hydronephrosis - followed by [name]   HIV nonspecific serology    2006: indeterminate HIV blood test, seen by ID, felt secondary to cross reacting antibodies with no further workup felt necessary at that time    Hyperlipidemia    Hypertension    Obesity    Personal history of radiation therapy 2016   right   Radiation 05/07/15-06/23/15   Right Breast   Past Surgical History:  Procedure Laterality Date   BREAST BIOPSY Left 02/10/2015   malignant    BREAST LUMPECTOMY Right 03/21/2015    CHOLECYSTECTOMY  2003   CYSTOSCOPY W/ LITHOLAPAXY / EHL     JOINT REPLACEMENT     bilateral knee replacement   LEFT AND RIGHT HEART CATHETERIZATION WITH CORONARY ANGIOGRAM N/A 05/02/2012   Procedure: LEFT AND RIGHT HEART CATHETERIZATION WITH CORONARY ANGIOGRAM;  Surgeon: Kirsten Hazel, MD;  Location: [name] C[facility];  Service: Cardiovascular;  Laterality: N/A;   RADIOACTIVE SEED GUIDED PARTIAL MASTECTOMY WITH AXILLARY SENTINEL LYMPH NODE BIOPSY Right 03/21/2015   Procedure: RIGHT  PARTIAL MASTECTOMY WITH RADIOACTIVE SEED LOCALIZATION RIGHT  AXILLARY SENTINEL LYMPH NODE BIOPSY;  Surgeon: Kirsten Kelp, MD;  Location: [name];  Service: General;  Laterality: Right;   REPLACEMENT TOTAL KNEE BILATERAL  2005 &2006   VASCULAR SURGERY Right 03/15/2013   Ultrasound guided sclerotherapy   Patient Active Problem List   Diagnosis Date Noted   Renal disease due to diabetes mellitus (HCC) 04/28/2023   Left shoulder pain 09/06/2022   Left breast mass 11/11/2021   Tinea pedis 04/09/2020   Spinal stenosis of lumbar region without neurogenic claudication 02/08/2019   Gout 05/16/2018   Gait instability 01/03/2018   Tremor of both hands    Stuttering    Essential hypertension    Hyperlipidemia 08/13/2015   Breast cancer of lower-outer quadrant of right female breast (HCC) 02/13/2015   Type 2 diabetes  mellitus with diabetic foot deformity (HCC) 01/01/2014   Chronic combined systolic and diastolic heart failure (HCC) 02/26/2013   OBESITY 02/05/2009   Asthma, intermittent 02/08/2008   Neuropathy due to secondary diabetes (HCC) 05/18/2007   NEPHROLITHIASIS 01/26/2007   DJD (degenerative joint disease), multiple sites 01/26/2007    PCP: Kirsten Living, MD   REFERRING PROVIDER: Tarry Kos, MD  REFERRING DIAG: [phone] (ICD-10-CM) - Chronic pain of left knee  THERAPY DIAG:  No diagnosis found.  Rationale for Evaluation and Treatment: Rehabilitation  ONSET DATE:  chronic  SUBJECTIVE:   SUBJECTIVE STATEMENT: Did not sleep well as L foot ws hurting last night.  Has had issues with gout flare ups in the past.  PERTINENT HISTORY: Plan: [name] is a 72 year old female with chronic left shoulder and left knee pain.  For the shoulder she has bone-on-bone glenohumeral osteoarthritis.  We will make a referral to [name] or [name] for surgical consultation.  If she is not interested in cortisone injections at this point as they have not worked in the past.  For the knee I do not see any problems with the implant.  I think her issue is lack of quad strength relative to her weight.  I will make a referral for physical therapy.  Follow-up as needed. PAIN:  Are you having pain? Yes: NPRS scale: 6-8/10 Pain location: L knee Pain description: ache Aggravating factors: prolonged activities and positioning Relieving factors: rest and position changes  PRECAUTIONS: None  RED FLAGS: None   WEIGHT BEARING RESTRICTIONS: No  FALLS:  Has patient fallen in last 6 months? No  OCCUPATION: retired  PLOF: Independent with basic ADLs  PATIENT GOALS: To reduce my knee symptoms  NEXT MD VISIT: PRN  OBJECTIVE:   DIAGNOSTIC FINDINGS: XR KNEE 3 VIEW LEFT   Result Date: 07/21/2023 X-rays demonstrate a total knee replacement with tibial baseplate in slight varus alignment.  No evidence of subsidence or loosening.   PATIENT SURVEYS:  FOTO 44(57 predicted)  MUSCLE LENGTH: Hamstrings: Left 70 deg  PALPATION: deferred  LOWER EXTREMITY ROM:  Active ROM Right eval Left eval  Hip flexion    Hip extension    Hip abduction    Hip adduction    Hip internal rotation    Hip external rotation    Knee flexion  114d  Knee extension  -2d  Ankle dorsiflexion    Ankle plantarflexion    Ankle inversion    Ankle eversion     (Blank rows = not tested)  LOWER EXTREMITY MMT:  MMT Right eval Left eval L 09/14/23  Hip flexion  4 4  Hip extension  4 4  Hip  abduction  4 4  Hip adduction     Hip internal rotation     Hip external rotation     Knee flexion  4- 4  Knee extension  4- 4  Ankle dorsiflexion     Ankle plantarflexion  4 4  Ankle inversion     Ankle eversion      (Blank rows = not tested)  FUNCTIONAL TESTS:  5x STS 17s 2 MWT 337ft with RW  GAIT: Distance walked: 4ft x2 Assistive device utilized: Environmental consultant - 4 wheeled Level of assistance: Modified independence Comments: slow cadence   TODAY'S TREATMENT:    [facility] Adult PT Treatment:  DATE: 09/14/23 Therapeutic Exercise: Nustep L4 8 min(decreased resistance to accommodate foot pain) FAQs L 5# 15x2 SAQs 5# 15x2 L heel slides 5# 15x2 supine  Heel raises 15x2 Toe raises 15x2 Standing hamstring curls 15x2 2# Step ups 4 in 10x with countertop support L only TKEs BluTB 15x2  [facility] Adult PT Treatment:                                                DATE: 09/12/23 Therapeutic Exercise: Nustep L5 8 min FAQs L 5# 15x2 SAQs 5# 15x2 L heel slides 5# 15x2 supine slide board Heel raises 15x2 Toe raises 15x2 Standing hamstring curls 15x2 3# Step ups 4in 10x with countertop support L only TKEs BluTB 15x2  [facility] Adult PT Treatment:                                                DATE: 09/07/23 Therapeutic Exercise: Nustep L4 8 min FAQs L 4# 15x2 SAQs 4# 15x2 L heel slides 4# 15x2 supine slide board Heel raises 15x Toe raises 15x Standing hamstring curls 15x2 2# Step ups 4in 10x with countertop support L only TKEs GTB 15x2  [facility] Adult PT Treatment:                                                DATE: 09/05/23 Therapeutic Exercise: Nustep L4 8 min FAQs L 4# 15x2 SAQs 4# 15x2 L heel slides 4# 15x2 supine slide board Heel raises 15x Toe raises 15x Standing hamstring curls 15x2 2# Step ups 4in 10x with countertop support TKEs GTB 15x2  [facility] Adult PT Treatment:                                                DATE:  08/31/23 Therapeutic Exercise: Nustep L4 6 min FAQs L 3# 15x2 SAQs 3# 15x2 L heel slides 3# 15x2 supine slide board Heel raises 15x Toe raises 15x Standing hamstring curls 15x 2 MWT with SPC 274ft  [facility] Adult PT Treatment:                                                DATE: 08/29/23 Therapeutic Exercise: Nustep L4 6 min FAQs L 2# 15x2 SAQs 2# 15x2 SLR L 2# 10x limited by RLE cramping L heel slides 2# 15x2 Supine march 2# LLE 15/15 Heel raises 15x Toe raises 15x Standing hamstring curls     [facility] Adult PT Treatment:                                                DATE: 08/24/23 Therapeutic Exercise: Nustep L2 6 min FAQs L 2# 15x2 SAQs 2# 15x2 SLR L  15x, 11x due to RLE cramping Heel toe 15x2 Standing hamstring curls                                                                                                                          DATE: 08/12/23 Eval and HEP    PATIENT EDUCATION:  Education details: Discussed eval findings, rehab rationale and POC and patient is in agreement  Person educated: Patient Education method: Explanation Education comprehension: verbalized understanding and needs further education  HOME EXERCISE PROGRAM: Access Code: [email or web address] URL: [email or web address] Date: 08/12/2023 Prepared by: Gustavus Bryant  Exercises - Seated Long Arc Quad  - 2 x daily - 5 x weekly - 2 sets - 10 reps - 2s hold - Standing Heel Raise with Support  - 2 x daily - 5 x weekly - 2 sets - 10 reps - Standing Knee Flexion AROM  - 2 x daily - 5 x weekly - 2 sets - 10 reps  ASSESSMENT:  CLINICAL IMPRESSION: Maximum function regained at this time regarding L knee function and pain.  Patient will undergo L shoulder surgery in 1 week and knee rehab will be concluded today   Patient is a 72 y.o. female  who was seen today for physical therapy evaluation and treatment for chronic L knee pain and subsequent hip/ankle weakness. Patient presents with good L knee ROM and  strength but demonstrates weakness in L hip and ankle.  Initial 2 MWT time is functional and will compare using SPC at future visits.  Patient is a good candidate for OPPT.  OBJECTIVE IMPAIRMENTS: Abnormal gait, decreased activity tolerance, decreased endurance, decreased knowledge of condition, decreased mobility, difficulty walking, decreased ROM, decreased strength, and pain.   ACTIVITY LIMITATIONS: carrying, lifting, sitting, standing, squatting, stairs, and locomotion level  PARTICIPATION LIMITATIONS: community activity  PERSONAL FACTORS: Age, Fitness, Past/current experiences, Time since onset of injury/illness/exacerbation, and 1 comorbidity: severe L shoulder OA  are also affecting patient's functional outcome.   REHAB POTENTIAL: Good  CLINICAL DECISION MAKING: Stable/uncomplicated  EVALUATION COMPLEXITY: Low   GOALS: Goals reviewed with patient? No  SHORT TERM GOALS: Target date: 09/02/2023   Patient to demonstrate independence in HEP  Baseline: [id number] Goal status: Met  2.  Assess 2 MWT with SPC and compare with RW results. Baseline: TBD; 08/31/23 253ft with SPC Goal status: Met   LONG TERM GOALS: Target date: 09/23/2023    Assess 2 MWT with cane Baseline: TBD; 08/31/23 241ft with SPC Goal status: Met  2.  Increase LLE strength to 4+/5 Baseline:  MMT Right eval Left eval L 09/14/23  Hip flexion  4 4  Hip extension  4 4  Hip abduction  4 4  Hip adduction     Hip internal rotation     Hip external rotation     Knee flexion  4- 4  Knee extension  4- 4  Ankle dorsiflexion     Ankle plantarflexion  4 4  Ankle inversion  Ankle eversion      Goal status: Met  3.  Decrease pain to 4/10 at worst Baseline: 6-8/10 Goal status: Not met  4.  Increase FOTO score to 57 Baseline: 44; 09/14/23 72% Goal status: Met     PLAN:  PT FREQUENCY: 1-2x/week  PT DURATION: 8 weeks  PLANNED INTERVENTIONS: Therapeutic exercises, Therapeutic activity,  Neuromuscular re-education, Balance training, Gait training, Patient/Family education, Self Care, Joint mobilization, Stair training, Dry Needling, Electrical stimulation, Cryotherapy, Moist heat, Manual therapy, and Re-evaluation  PLAN FOR NEXT SESSION: HEP review and update, manual techniques as appropriate, aerobic tasks, ROM and flexibility activities, strengthening and PREs, TPDN, gait and balance training as needed     Hildred Laser, PT 09/14/2023, 10:49 AM

## 2023-09-13 ENCOUNTER — Other Ambulatory Visit: Payer: Self-pay

## 2023-09-13 ENCOUNTER — Encounter (HOSPITAL_BASED_OUTPATIENT_CLINIC_OR_DEPARTMENT_OTHER): Payer: Self-pay | Admitting: Orthopaedic Surgery

## 2023-09-13 NOTE — Progress Notes (Signed)
   09/13/23 1007  PAT Phone Screen  Is the patient taking a GLP-1 receptor agonist? Yes (takes on fridays)  Has the patient been informed on holding medication? Yes  Do You Have Diabetes? Yes  Do You Have Hypertension? Yes  Have You Ever Been to the ER for Asthma? Yes  Have You Taken Oral Steroids in the Past 3 Months? No  Do you Take Phenteramine or any Other Diet Drugs? No  Recent  Lab Work, EKG, CXR? No  Do you have a history of heart problems? Yes  Cardiologist Name Pt cleared by cards years ago and now just follows up with PCP  Have you ever had tests on your heart? Yes  What cardiac tests were performed? Echo  Results viewable: [facility] Tab  Any Recent Hospitalizations? No  Height 5\' 2"  (1.575 m)  Weight 94.3 kg  Pat Appointment Scheduled Yes

## 2023-09-14 ENCOUNTER — Ambulatory Visit: Payer: 59

## 2023-09-14 DIAGNOSIS — M6281 Muscle weakness (generalized): Secondary | ICD-10-CM | POA: Diagnosis not present

## 2023-09-14 DIAGNOSIS — G8929 Other chronic pain: Secondary | ICD-10-CM

## 2023-09-14 DIAGNOSIS — R2689 Other abnormalities of gait and mobility: Secondary | ICD-10-CM

## 2023-09-14 DIAGNOSIS — M25562 Pain in left knee: Secondary | ICD-10-CM | POA: Diagnosis not present

## 2023-09-15 ENCOUNTER — Encounter (HOSPITAL_BASED_OUTPATIENT_CLINIC_OR_DEPARTMENT_OTHER)
Admission: RE | Admit: 2023-09-15 | Discharge: 2023-09-15 | Disposition: A | Discharge status: Home / Self Care | Payer: 59 | Source: Ambulatory Visit | Attending: Orthopaedic Surgery | Admitting: Orthopaedic Surgery

## 2023-09-15 DIAGNOSIS — Z01812 Encounter for preprocedural laboratory examination: Secondary | ICD-10-CM | POA: Diagnosis present

## 2023-09-15 DIAGNOSIS — Z01810 Encounter for preprocedural cardiovascular examination: Secondary | ICD-10-CM | POA: Diagnosis present

## 2023-09-15 DIAGNOSIS — Z01818 Encounter for other preprocedural examination: Secondary | ICD-10-CM | POA: Insufficient documentation

## 2023-09-15 LAB — BASIC METABOLIC PANEL
Anion gap: 13 (ref 5–15)
BUN: 23 mg/dL (ref 8–23)
CO2: 24 mmol/L (ref 22–32)
Calcium: 9.6 mg/dL (ref 8.9–10.3)
Chloride: 101 mmol/L (ref 98–111)
Creatinine, Ser: 1.22 mg/dL — ABNORMAL HIGH (ref 0.44–1.00)
GFR, Estimated: 47 mL/min — ABNORMAL LOW (ref >60)
Glucose, Bld: 111 mg/dL — ABNORMAL HIGH (ref 70–99)
Potassium: 4.4 mmol/L (ref 3.5–5.1)
Sodium: 138 mmol/L (ref 135–145)

## 2023-09-19 ENCOUNTER — Ambulatory Visit (HOSPITAL_BASED_OUTPATIENT_CLINIC_OR_DEPARTMENT_OTHER): Payer: Self-pay | Admitting: Orthopaedic Surgery

## 2023-09-19 DIAGNOSIS — M19012 Primary osteoarthritis, left shoulder: Secondary | ICD-10-CM

## 2023-09-19 NOTE — Plan of Care (Signed)
[facility] Tonsillectomy/Adenoidectomy, Postoperative PEDS care plan entered in error.

## 2023-09-20 ENCOUNTER — Encounter (HOSPITAL_BASED_OUTPATIENT_CLINIC_OR_DEPARTMENT_OTHER): Payer: Self-pay | Admitting: Orthopaedic Surgery

## 2023-09-20 ENCOUNTER — Ambulatory Visit (HOSPITAL_BASED_OUTPATIENT_CLINIC_OR_DEPARTMENT_OTHER)
Admission: RE | Admit: 2023-09-20 | Discharge: 2023-09-20 | Disposition: A | Discharge status: Home / Self Care | Payer: 59 | Attending: Orthopaedic Surgery | Admitting: Orthopaedic Surgery

## 2023-09-20 ENCOUNTER — Encounter (HOSPITAL_BASED_OUTPATIENT_CLINIC_OR_DEPARTMENT_OTHER)
Admission: RE | Disposition: A | Discharge status: Home / Self Care | Payer: Self-pay | Source: Home / Self Care | Attending: Orthopaedic Surgery

## 2023-09-20 ENCOUNTER — Other Ambulatory Visit: Payer: Self-pay

## 2023-09-20 ENCOUNTER — Ambulatory Visit (HOSPITAL_COMMUNITY): Payer: 59 | Admitting: Anesthesiology

## 2023-09-20 DIAGNOSIS — Z7985 Long-term (current) use of injectable non-insulin antidiabetic drugs: Secondary | ICD-10-CM | POA: Insufficient documentation

## 2023-09-20 DIAGNOSIS — Z539 Procedure and treatment not carried out, unspecified reason: Secondary | ICD-10-CM | POA: Insufficient documentation

## 2023-09-20 DIAGNOSIS — Z96653 Presence of artificial knee joint, bilateral: Secondary | ICD-10-CM | POA: Insufficient documentation

## 2023-09-20 DIAGNOSIS — J45909 Unspecified asthma, uncomplicated: Secondary | ICD-10-CM | POA: Diagnosis not present

## 2023-09-20 DIAGNOSIS — M797 Fibromyalgia: Secondary | ICD-10-CM | POA: Insufficient documentation

## 2023-09-20 DIAGNOSIS — M19012 Primary osteoarthritis, left shoulder: Secondary | ICD-10-CM | POA: Diagnosis not present

## 2023-09-20 DIAGNOSIS — E119 Type 2 diabetes mellitus without complications: Secondary | ICD-10-CM | POA: Insufficient documentation

## 2023-09-20 DIAGNOSIS — I110 Hypertensive heart disease with heart failure: Secondary | ICD-10-CM | POA: Diagnosis not present

## 2023-09-20 DIAGNOSIS — I509 Heart failure, unspecified: Secondary | ICD-10-CM | POA: Insufficient documentation

## 2023-09-20 DIAGNOSIS — Z01818 Encounter for other preprocedural examination: Secondary | ICD-10-CM

## 2023-09-20 DIAGNOSIS — E669 Obesity, unspecified: Secondary | ICD-10-CM | POA: Diagnosis not present

## 2023-09-20 DIAGNOSIS — Z794 Long term (current) use of insulin: Secondary | ICD-10-CM | POA: Diagnosis not present

## 2023-09-20 DIAGNOSIS — Z853 Personal history of malignant neoplasm of breast: Secondary | ICD-10-CM | POA: Insufficient documentation

## 2023-09-20 DIAGNOSIS — Z923 Personal history of irradiation: Secondary | ICD-10-CM | POA: Insufficient documentation

## 2023-09-20 DIAGNOSIS — Z6838 Body mass index (BMI) 38.0-38.9, adult: Secondary | ICD-10-CM | POA: Diagnosis not present

## 2023-09-20 DIAGNOSIS — Z7984 Long term (current) use of oral hypoglycemic drugs: Secondary | ICD-10-CM | POA: Diagnosis not present

## 2023-09-20 DIAGNOSIS — Z7951 Long term (current) use of inhaled steroids: Secondary | ICD-10-CM | POA: Diagnosis not present

## 2023-09-20 DIAGNOSIS — M25512 Pain in left shoulder: Secondary | ICD-10-CM | POA: Diagnosis present

## 2023-09-20 LAB — GLUCOSE, CAPILLARY: Glucose-Capillary: 155 mg/dL — ABNORMAL HIGH (ref 70–99)

## 2023-09-20 SURGERY — ARTHROPLASTY, SHOULDER, TOTAL, REVERSE
Anesthesia: Choice | Site: Shoulder | Laterality: Left

## 2023-09-20 MED ORDER — EPHEDRINE 5 MG/ML INJ
INTRAVENOUS | Status: AC
  Filled 2023-09-20: qty 5

## 2023-09-20 MED ORDER — TRANEXAMIC ACID-NACL 1000-0.7 MG/100ML-% IV SOLN: 1000.0000 mg | INTRAVENOUS | Status: DC

## 2023-09-20 MED ORDER — ACETAMINOPHEN 500 MG PO TABS
ORAL_TABLET | ORAL | Status: AC
  Filled 2023-09-20: qty 2

## 2023-09-20 MED ORDER — CEFAZOLIN SODIUM-DEXTROSE 2-4 GM/100ML-% IV SOLN: 2.0000 g | INTRAVENOUS | Status: DC

## 2023-09-20 MED ORDER — GABAPENTIN 300 MG PO CAPS
300.0000 mg | ORAL_CAPSULE | Freq: Once | ORAL | Status: AC
  Administered 2023-09-20: 300 mg via ORAL

## 2023-09-20 MED ORDER — LACTATED RINGERS IV SOLN: INTRAVENOUS | Status: DC

## 2023-09-20 MED ORDER — CEFAZOLIN SODIUM-DEXTROSE 2-4 GM/100ML-% IV SOLN
INTRAVENOUS | Status: AC
  Filled 2023-09-20: qty 100

## 2023-09-20 MED ORDER — LIDOCAINE 2% (20 MG/ML) 5 ML SYRINGE
INTRAMUSCULAR | Status: AC
  Filled 2023-09-20: qty 5

## 2023-09-20 MED ORDER — ROCURONIUM BROMIDE 10 MG/ML (PF) SYRINGE
PREFILLED_SYRINGE | INTRAVENOUS | Status: AC
  Filled 2023-09-20: qty 10

## 2023-09-20 MED ORDER — ONDANSETRON HCL 4 MG/2ML IJ SOLN
INTRAMUSCULAR | Status: AC
  Filled 2023-09-20: qty 2

## 2023-09-20 MED ORDER — FENTANYL CITRATE (PF) 100 MCG/2ML IJ SOLN
INTRAMUSCULAR | Status: AC
  Filled 2023-09-20: qty 2

## 2023-09-20 MED ORDER — GABAPENTIN 300 MG PO CAPS
ORAL_CAPSULE | ORAL | Status: AC
  Filled 2023-09-20: qty 1

## 2023-09-20 MED ORDER — ACETAMINOPHEN 500 MG PO TABS
1000.0000 mg | ORAL_TABLET | Freq: Once | ORAL | Status: AC
  Administered 2023-09-20: 1000 mg via ORAL

## 2023-09-20 MED ORDER — TRANEXAMIC ACID-NACL 1000-0.7 MG/100ML-% IV SOLN
INTRAVENOUS | Status: AC
  Filled 2023-09-20: qty 100

## 2023-09-20 MED ORDER — DEXAMETHASONE SODIUM PHOSPHATE 10 MG/ML IJ SOLN
INTRAMUSCULAR | Status: AC
  Filled 2023-09-20: qty 1

## 2023-09-20 MED ORDER — MIDAZOLAM HCL 2 MG/2ML IJ SOLN
INTRAMUSCULAR | Status: AC
  Filled 2023-09-20: qty 2

## 2023-09-20 NOTE — H&P (View-Only) (Signed)
Chief Complaint: Left shoulder pain        History of Present Illness:      [name] is a 72 y.o. female presents today as a referral from my partner Dr. Roda Shutters for ongoing left shoulder limited range of motion and pain.  She is having a very difficult time laying directly on this side.  She is right-hand dominant but does use this arm for activities of daily living including grooming.  She has not been able to do this as result of her limited motion and pain.  She has been working with physical therapy for the last several months on the shoulder without any improvement.       Surgical History:   None   PMH/PSH/Family History/Social History/Meds/Allergies:         Past Medical History:  Diagnosis Date   Anemia     Arthritis     Asthma     Blood transfusion without reported diagnosis      Patients believes 2015 when she overdosed on "medications"    Breast cancer (HCC)     Breast cancer of lower-outer quadrant of right female breast (HCC) 02/13/2015   Cataract     CHF, acute (HCC) 05/03/2012   Depression     Diabetes mellitus     Eczema     Fatty liver      Fatty infiltration of liver noted on 03/2012 CT scan   Fibromyalgia     Glaucoma     Heart disease     History of kidney stones      w/ hx of hydronephrosis - followed by [facility]   HIV nonspecific serology      2006: indeterminate HIV blood test, seen by ID, felt secondary to cross reacting antibodies with no further workup felt necessary at that time    Hyperlipidemia     Hypertension     Obesity     Personal history of radiation therapy 2016    right   Radiation 05/07/15-06/23/15    Right Breast             Past Surgical History:  Procedure Laterality Date   BREAST BIOPSY Left 02/10/2015    malignant    BREAST LUMPECTOMY Right 03/21/2015   CHOLECYSTECTOMY   2003   CYSTOSCOPY W/ LITHOLAPAXY / [facility]       JOINT REPLACEMENT        bilateral knee replacement   LEFT AND RIGHT HEART  CATHETERIZATION WITH CORONARY ANGIOGRAM N/A 05/02/2012    Procedure: LEFT AND RIGHT HEART CATHETERIZATION WITH CORONARY ANGIOGRAM;  Surgeon: Kathleene Hazel, MD;  Location: [facility] CATH LAB;  Service: Cardiovascular;  Laterality: N/A;   RADIOACTIVE SEED GUIDED PARTIAL MASTECTOMY WITH AXILLARY SENTINEL LYMPH NODE BIOPSY Right 03/21/2015    Procedure: RIGHT  PARTIAL MASTECTOMY WITH RADIOACTIVE SEED LOCALIZATION RIGHT  AXILLARY SENTINEL LYMPH NODE BIOPSY;  Surgeon: Claud Kelp, MD;  Location: [facility];  Service: General;  Laterality: Right;   REPLACEMENT TOTAL KNEE BILATERAL   2005 &2006   VASCULAR SURGERY Right 03/15/2013    Ultrasound guided sclerotherapy        Social History         Socioeconomic History   Marital status: Widowed      Spouse name: [name]   Number of children: 3   Years of education: 14   Highest education level: Associate degree: occupational, Scientist, product/process development, or vocational program  Occupational History   Occupation:  retired-CNA, housekeeping      Employer: RETIRED   Occupation: Engineer, site  Tobacco Use   Smoking status: Never   Smokeless tobacco: Never  Vaping Use   Vaping status: Never Used  Substance and Sexual Activity   Alcohol use: No      Alcohol/week: 0.0 standard drinks of alcohol   Drug use: No   Sexual activity: Not Currently      Birth control/protection: Post-menopausal  Other Topics Concern   Not on file  Social History Narrative    Emergency Contact: son, [name] [phone]    Patient lives is single story home with daughter, [name] and grandddaughter, [name]. Ramp to front door, 3 steps in back. Home has smoke detectors, no throw rugs. No grab bars in bathroom. No pets.    Diet: Pt has a varied diet.  Reports eating 2 meals a day.     Exercise: walks daily 15-20 mins; not as much due to possible irritable bowel; does exercise at church once weekly. Serves on [employer] at [employer] and as a Chief Strategy Officer at  [employer].    Seatbelts: Pt reports wearing seatbelt when in vehicles. Does not drive.    Hobbies: word searches, church, time with family and friends, cooking, walking when she can; going out shopping.    Drinks occasional soda; drinks a lot of water and some kool-aid; does drink tea         *Update as of 04/19/2019*    Patient has not able to walk around her neighborhood, or down her street, due to COVID. Patient is eager to get back to this once she feels "safe" to leave her house.     Social Determinants of Health        Financial Resource Strain: Low Risk  (07/09/2023)    Overall Financial Resource Strain (CARDIA)     Difficulty of Paying Living Expenses: Not hard at all  Food Insecurity: No Food Insecurity (07/09/2023)    Hunger Vital Sign     Worried About Running Out of Food in the Last Year: Never true     Ran Out of Food in the Last Year: Never true  Transportation Needs: No Transportation Needs (07/09/2023)    PRAPARE - Therapist, art (Medical): No     Lack of Transportation (Non-Medical): No  Physical Activity: Insufficiently Active (07/09/2023)    Exercise Vital Sign     Days of Exercise per Week: 2 days     Minutes of Exercise per Session: 30 min  Stress: No Stress Concern Present (07/09/2023)    [employer] of Occupational Health - Occupational Stress Questionnaire     Feeling of Stress : Not at all  Social Connections: Moderately Integrated (07/09/2023)    Social Connection and Isolation Panel [NHANES]     Frequency of Communication with Friends and Family: More than three times a week     Frequency of Social Gatherings with Friends and Family: Three times a week     Attends Religious Services: More than 4 times per year     Active Member of Clubs or Organizations: Yes     Attends Banker Meetings: More than 4 times per year     Marital Status: Widowed         Family History  Problem Relation Age of Onset   Diabetes Mother      Stroke Mother     Heart disease Father  Anemia Father     Esophageal cancer Sister     Cancer Sister 56        nose cancer    Diabetes Sister          2 sisters type 1 diabetes    Kidney disease Sister          waiting for a kidney transplant- on dialysis   Diabetes Brother          Type 2 diabetic    Diabetes Son          Type 2    Hypertension Son     Cancer Cousin 30        ovarian cancer    Cancer Cousin 61        ovarian cancer    Colon cancer Neg Hx     Rectal cancer Neg Hx     Stomach cancer Neg Hx          Allergies  No Known Allergies         Current Outpatient Medications  Medication Sig Dispense Refill   acetaminophen (TYLENOL) 500 MG tablet Take 1 tablet (500 mg total) by mouth every 8 (eight) hours for 10 days. 30 tablet 0   aspirin EC 325 MG tablet Take 1 tablet (325 mg total) by mouth daily. 14 tablet 0   ibuprofen (ADVIL) 800 MG tablet Take 1 tablet (800 mg total) by mouth every 8 (eight) hours for 10 days. Please take with food, please alternate with acetaminophen 30 tablet 0   oxyCODONE (ROXICODONE) 5 MG immediate release tablet Take 1 tablet (5 mg total) by mouth every 4 (four) hours as needed for severe pain or breakthrough pain. 10 tablet 0   acetaminophen (TYLENOL) 650 MG CR tablet Take 650 mg by mouth every 8 (eight) hours as needed for pain.       allopurinol (ZYLOPRIM) 100 MG tablet TAKE 1 TABLET BY MOUTH DAILY 100 tablet 2   atorvastatin (LIPITOR) 40 MG tablet TAKE 1 TABLET BY MOUTH DAILY 100 tablet 2   blood glucose meter kit and supplies Dispense based on patient and insurance preference.Use to check blood sugar in the morning. (FOR ICD-10 E10.9, E11.9).   Lum Babe is what she has been using. 1 each 0   Blood Glucose Monitoring Suppl (ONETOUCH VERIO) w/Device KIT Use to check blood glucose every morning. E11.9 1 kit 0   CARESTART COVID-19 HOME TEST KIT See admin instructions.       colchicine 0.6 MG tablet TAKE 1 TABLET BY MOUTH DAILY  100 tablet 2   dorzolamide (TRUSOPT) 2 % ophthalmic solution 1 drop daily.       dorzolamide-timolol (COSOPT) 22.3-6.8 MG/ML ophthalmic solution Place 1 drop into both eyes 2 (two) times daily.   0   empagliflozin (JARDIANCE) 25 MG TABS tablet Take 1 tablet (25 mg total) by mouth daily. 100 tablet 2   fluticasone furoate-vilanterol (BREO ELLIPTA) 200-25 MCG/ACT AEPB Inhale 1 puff into the lungs daily. 60 each 3   gabapentin (NEURONTIN) 100 MG capsule TAKE 1 CAPSULE BY MOUTH AT  BEDTIME 100 capsule 1   glucose blood (ONETOUCH ULTRA) test strip USE TO CHECK BLOOD SUGAR EVERY MORNING 100 each 2   glucose blood (ONETOUCH VERIO) test strip Use as instructed 100 each 12   glucose blood (ONETOUCH VERIO) test strip CHECK BLOOD SUGAR IN THE  MORNING AS DIRECTED 100 strip 3   Insulin Pen Needle (EASY COMFORT PEN NEEDLES) 31G X 8  MM MISC USE TO INJECT INSULIN EVERY DAY AS DIRECTED 100 each 3   Lancet Devices (ONETOUCH DELICA PLUS LANCING) MISC USE ONCE DAILY 100 each 3   Lancets (ONETOUCH DELICA PLUS LANCET33G) MISC USE DAILY AS NEEDED. 100 each prn   LANTUS SOLOSTAR 100 UNIT/ML Solostar Pen INJECT 32 UNITS EACH MORNING 30 mL 6   latanoprost (XALATAN) 0.005 % ophthalmic solution Place 1 drop into both eyes at bedtime.    0   losartan (COZAAR) 100 MG tablet TAKE 1 TABLET BY MOUTH AT  BEDTIME 100 tablet 2   metoprolol succinate (TOPROL-XL) 25 MG 24 hr tablet TAKE 1 TABLET BY MOUTH ONCE  DAILY 100 tablet 2   Miconazole Nitrate (LOTRIMIN AF) 2 % AERO Spray between toes once daily for 4 weeks. 150 g 1   ONETOUCH VERIO test strip CHECK BLOOD SUGAR IN THE MORNING AS DIRECTED 100 strip 2   OZEMPIC, 2 MG/DOSE, 8 MG/3ML SOPN INJECT SUBCUTANEOUSLY 2 MG ONCE  A WEEK 9 mL 3   spironolactone (ALDACTONE) 25 MG tablet TAKE 1 TABLET BY MOUTH DAILY 100 tablet 2      No current facility-administered medications for this visit.      Imaging Results (Last 48 hours)  No results found.     Review of Systems:   A ROS was  performed including pertinent positives and negatives as documented in the HPI.   Physical Exam :   Constitutional: NAD and appears stated age Neurological: Alert and oriented Psych: Appropriate affect and cooperative There were no vitals taken for this visit.    Comprehensive Musculoskeletal Exam:     Musculoskeletal Exam      Inspection Right Left  Skin No atrophy or winging No atrophy or winging  Palpation      Tenderness none Glenohumeral  Range of Motion      Flexion (passive) 170 30  Flexion (active) 170 30  Abduction 170 20  ER at the side 70 20  Can reach behind back to T12 Side pocket  Strength        Full Limited  Special Tests      Pseudoparalytic No No  Neurologic      Fires PIN, radial, median, ulnar, musculocutaneous, axillary, suprascapular, long thoracic, and spinal accessory innervated muscles. No abnormal sensibility  Vascular/Lymphatic      Radial Pulse 2+ 2+  Cervical Exam      Patient has symmetric cervical range of motion with negative Spurling's test.  Special Test: Positive pseudoparalysis        Imaging:   Xray (3 views left shoulder): Advanced/end-stage glenohumeral osteoarthritis     I personally reviewed and interpreted the radiographs.     Assessment:   72 y.o. female with advanced left shoulder glenohumeral osteoarthritis.  At this time she has trialed physical therapy with quite limited overhead motion.  Given the fact that she does have essentially pseudoparalysis we did discuss the possibility of shoulder arthroplasty specifically reverse shoulder arthroplasty.  I did describe that given that she is walker dependent that there would be a period where she would need to rely on a cane.  We did discuss specific risks associated with losing her balance such as dislocation of the prosthesis.  After discussion of all of these she is ultimately elected for left reverse shoulder arthroplasty   Plan :     -Plan for left reverse shoulder  arthroplasty       After a lengthy discussion of treatment options, including risks,  benefits, alternatives, complications of surgical and nonsurgical conservative options, the patient elected surgical repair.    The patient  is aware of the material risks  and complications including, but not limited to injury to adjacent structures, neurovascular injury, infection, numbness, bleeding, implant failure, thermal burns, stiffness, persistent pain, failure to heal, disease transmission from allograft, need for further surgery, dislocation, anesthetic risks, blood clots, risks of death,and others. The probabilities of surgical success and failure discussed with patient given their particular co-morbidities.The time and nature of expected rehabilitation and recovery was discussed.The patient's questions were all answered preoperatively.  No barriers to understanding were noted. I explained the natural history of the disease process and Rx rationale.  I explained to the patient what I considered to be reasonable expectations given their personal situation.  The final treatment plan was arrived at through a shared patient decision making process model.         I personally saw and evaluated the patient, and participated in the management and treatment plan.   Huel Cote, MD Attending Physician, Orthopedic Surgery

## 2023-09-20 NOTE — Anesthesia Preprocedure Evaluation (Signed)
Anesthesia Evaluation  Patient identified by MRN, date of birth, ID band Patient awake    Reviewed: Allergy & Precautions, NPO status , Patient's Chart, lab work & pertinent test results, reviewed documented beta blocker date and time   Airway        Dental   Pulmonary           Cardiovascular hypertension, Pt. on medications and Pt. on home beta blockers      Neuro/Psych    GI/Hepatic   Endo/Other  diabetes, Type 2    Renal/GU      Musculoskeletal   Abdominal   Peds  Hematology   Anesthesia Other Findings Older Notes   Notes Recorded by Burnell Blanks on 02/17/2016 at 9:51 AM Advised of nuclear results ------   Notes Recorded by Chilton Si, MD on 02/13/2016 at 3:55 PM Low risk stress test.  Her heart is squeezing better than on her last echo.     Older Notes   Notes Recorded by Freddi Starr, RN on 05/10/2016 at 11:08 AM pt aware of results ------   Notes Recorded by Chilton Si, MD on 05/08/2016 at 9:46 PM Echo shows that her heart is squeezing near normal now.  This is great news.  She does not need a defibrillator.         Reproductive/Obstetrics                             Anesthesia Physical Anesthesia Plan  ASA: 3  Anesthesia Plan: General and Regional   Post-op Pain Management:    Induction:   PONV Risk Score and Plan:   Airway Management Planned:   Additional Equipment:   Intra-op Plan:   Post-operative Plan:   Informed Consent:   Plan Discussed with:   Anesthesia Plan Comments:        Anesthesia Quick Evaluation

## 2023-09-20 NOTE — H&P (Signed)
Chief Complaint: Left shoulder pain        History of Present Illness:      [name] is a 73 y.o. female presents today as a referral from my partner Dr. Roda Shutters for ongoing left shoulder limited range of motion and pain.  She is having a very difficult time laying directly on this side.  She is right-hand dominant but does use this arm for activities of daily living including grooming.  She has not been able to do this as result of her limited motion and pain.  She has been working with physical therapy for the last several months on the shoulder without any improvement.       Surgical History:   None   PMH/PSH/Family History/Social History/Meds/Allergies:         Past Medical History:  Diagnosis Date   Anemia     Arthritis     Asthma     Blood transfusion without reported diagnosis      Patients believes 2015 when she overdosed on "medications"    Breast cancer (HCC)     Breast cancer of lower-outer quadrant of right female breast (HCC) 02/13/2015   Cataract     CHF, acute (HCC) 05/03/2012   Depression     Diabetes mellitus     Eczema     Fatty liver      Fatty infiltration of liver noted on 03/2012 CT scan   Fibromyalgia     Glaucoma     Heart disease     History of kidney stones      w/ hx of hydronephrosis - followed by [facility]   HIV nonspecific serology      2006: indeterminate HIV blood test, seen by ID, felt secondary to cross reacting antibodies with no further workup felt necessary at that time    Hyperlipidemia     Hypertension     Obesity     Personal history of radiation therapy 2016    right   Radiation 05/07/15-06/23/15    Right Breast             Past Surgical History:  Procedure Laterality Date   BREAST BIOPSY Left 02/10/2015    malignant    BREAST LUMPECTOMY Right 03/21/2015   CHOLECYSTECTOMY   2003   CYSTOSCOPY W/ LITHOLAPAXY / [facility]       JOINT REPLACEMENT        bilateral knee replacement   LEFT AND RIGHT HEART  CATHETERIZATION WITH CORONARY ANGIOGRAM N/A 05/02/2012    Procedure: LEFT AND RIGHT HEART CATHETERIZATION WITH CORONARY ANGIOGRAM;  Surgeon: Kathleene Hazel, MD;  Location: [facility] CATH LAB;  Service: Cardiovascular;  Laterality: N/A;   RADIOACTIVE SEED GUIDED PARTIAL MASTECTOMY WITH AXILLARY SENTINEL LYMPH NODE BIOPSY Right 03/21/2015    Procedure: RIGHT  PARTIAL MASTECTOMY WITH RADIOACTIVE SEED LOCALIZATION RIGHT  AXILLARY SENTINEL LYMPH NODE BIOPSY;  Surgeon: Claud Kelp, MD;  Location: [facility];  Service: General;  Laterality: Right;   REPLACEMENT TOTAL KNEE BILATERAL   2005 &2006   VASCULAR SURGERY Right 03/15/2013    Ultrasound guided sclerotherapy        Social History         Socioeconomic History   Marital status: Widowed      Spouse name: [name]   Number of children: 3   Years of education: 14   Highest education level: Associate degree: occupational, Scientist, product/process development, or vocational program  Occupational History   Occupation:  retired-CNA, housekeeping      Employer: RETIRED   Occupation: Engineer, site  Tobacco Use   Smoking status: Never   Smokeless tobacco: Never  Vaping Use   Vaping status: Never Used  Substance and Sexual Activity   Alcohol use: No      Alcohol/week: 0.0 standard drinks of alcohol   Drug use: No   Sexual activity: Not Currently      Birth control/protection: Post-menopausal  Other Topics Concern   Not on file  Social History Narrative    Emergency Contact: son, [name] [phone]    Patient lives is single story home with daughter, [name] and grandddaughter, [name]. Ramp to front door, 3 steps in back. Home has smoke detectors, no throw rugs. No grab bars in bathroom. No pets.    Diet: Pt has a varied diet.  Reports eating 2 meals a day.     Exercise: walks daily 15-20 mins; not as much due to possible irritable bowel; does exercise at church once weekly. Serves on [employer] at [employer] and as a Chief Strategy Officer at  [employer].    Seatbelts: Pt reports wearing seatbelt when in vehicles. Does not drive.    Hobbies: word searches, church, time with family and friends, cooking, walking when she can; going out shopping.    Drinks occasional soda; drinks a lot of water and some kool-aid; does drink tea         *Update as of 04/19/2019*    Patient has not able to walk around her neighborhood, or down her street, due to COVID. Patient is eager to get back to this once she feels "safe" to leave her house.     Social Determinants of Health        Financial Resource Strain: Low Risk  (07/09/2023)    Overall Financial Resource Strain (CARDIA)     Difficulty of Paying Living Expenses: Not hard at all  Food Insecurity: No Food Insecurity (07/09/2023)    Hunger Vital Sign     Worried About Running Out of Food in the Last Year: Never true     Ran Out of Food in the Last Year: Never true  Transportation Needs: No Transportation Needs (07/09/2023)    PRAPARE - Therapist, art (Medical): No     Lack of Transportation (Non-Medical): No  Physical Activity: Insufficiently Active (07/09/2023)    Exercise Vital Sign     Days of Exercise per Week: 2 days     Minutes of Exercise per Session: 30 min  Stress: No Stress Concern Present (07/09/2023)    [employer] of Occupational Health - Occupational Stress Questionnaire     Feeling of Stress : Not at all  Social Connections: Moderately Integrated (07/09/2023)    Social Connection and Isolation Panel [NHANES]     Frequency of Communication with Friends and Family: More than three times a week     Frequency of Social Gatherings with Friends and Family: Three times a week     Attends Religious Services: More than 4 times per year     Active Member of Clubs or Organizations: Yes     Attends Banker Meetings: More than 4 times per year     Marital Status: Widowed         Family History  Problem Relation Age of Onset   Diabetes Mother      Stroke Mother     Heart disease Father  Anemia Father     Esophageal cancer Sister     Cancer Sister 56        nose cancer    Diabetes Sister          2 sisters type 1 diabetes    Kidney disease Sister          waiting for a kidney transplant- on dialysis   Diabetes Brother          Type 2 diabetic    Diabetes Son          Type 2    Hypertension Son     Cancer Cousin 30        ovarian cancer    Cancer Cousin 61        ovarian cancer    Colon cancer Neg Hx     Rectal cancer Neg Hx     Stomach cancer Neg Hx          Allergies  No Known Allergies         Current Outpatient Medications  Medication Sig Dispense Refill   acetaminophen (TYLENOL) 500 MG tablet Take 1 tablet (500 mg total) by mouth every 8 (eight) hours for 10 days. 30 tablet 0   aspirin EC 325 MG tablet Take 1 tablet (325 mg total) by mouth daily. 14 tablet 0   ibuprofen (ADVIL) 800 MG tablet Take 1 tablet (800 mg total) by mouth every 8 (eight) hours for 10 days. Please take with food, please alternate with acetaminophen 30 tablet 0   oxyCODONE (ROXICODONE) 5 MG immediate release tablet Take 1 tablet (5 mg total) by mouth every 4 (four) hours as needed for severe pain or breakthrough pain. 10 tablet 0   acetaminophen (TYLENOL) 650 MG CR tablet Take 650 mg by mouth every 8 (eight) hours as needed for pain.       allopurinol (ZYLOPRIM) 100 MG tablet TAKE 1 TABLET BY MOUTH DAILY 100 tablet 2   atorvastatin (LIPITOR) 40 MG tablet TAKE 1 TABLET BY MOUTH DAILY 100 tablet 2   blood glucose meter kit and supplies Dispense based on patient and insurance preference.Use to check blood sugar in the morning. (FOR ICD-10 E10.9, E11.9).   Lum Babe is what she has been using. 1 each 0   Blood Glucose Monitoring Suppl (ONETOUCH VERIO) w/Device KIT Use to check blood glucose every morning. E11.9 1 kit 0   CARESTART COVID-19 HOME TEST KIT See admin instructions.       colchicine 0.6 MG tablet TAKE 1 TABLET BY MOUTH DAILY  100 tablet 2   dorzolamide (TRUSOPT) 2 % ophthalmic solution 1 drop daily.       dorzolamide-timolol (COSOPT) 22.3-6.8 MG/ML ophthalmic solution Place 1 drop into both eyes 2 (two) times daily.   0   empagliflozin (JARDIANCE) 25 MG TABS tablet Take 1 tablet (25 mg total) by mouth daily. 100 tablet 2   fluticasone furoate-vilanterol (BREO ELLIPTA) 200-25 MCG/ACT AEPB Inhale 1 puff into the lungs daily. 60 each 3   gabapentin (NEURONTIN) 100 MG capsule TAKE 1 CAPSULE BY MOUTH AT  BEDTIME 100 capsule 1   glucose blood (ONETOUCH ULTRA) test strip USE TO CHECK BLOOD SUGAR EVERY MORNING 100 each 2   glucose blood (ONETOUCH VERIO) test strip Use as instructed 100 each 12   glucose blood (ONETOUCH VERIO) test strip CHECK BLOOD SUGAR IN THE  MORNING AS DIRECTED 100 strip 3   Insulin Pen Needle (EASY COMFORT PEN NEEDLES) 31G X 8  MM MISC USE TO INJECT INSULIN EVERY DAY AS DIRECTED 100 each 3   Lancet Devices (ONETOUCH DELICA PLUS LANCING) MISC USE ONCE DAILY 100 each 3   Lancets (ONETOUCH DELICA PLUS LANCET33G) MISC USE DAILY AS NEEDED. 100 each prn   LANTUS SOLOSTAR 100 UNIT/ML Solostar Pen INJECT 32 UNITS EACH MORNING 30 mL 6   latanoprost (XALATAN) 0.005 % ophthalmic solution Place 1 drop into both eyes at bedtime.    0   losartan (COZAAR) 100 MG tablet TAKE 1 TABLET BY MOUTH AT  BEDTIME 100 tablet 2   metoprolol succinate (TOPROL-XL) 25 MG 24 hr tablet TAKE 1 TABLET BY MOUTH ONCE  DAILY 100 tablet 2   Miconazole Nitrate (LOTRIMIN AF) 2 % AERO Spray between toes once daily for 4 weeks. 150 g 1   ONETOUCH VERIO test strip CHECK BLOOD SUGAR IN THE MORNING AS DIRECTED 100 strip 2   OZEMPIC, 2 MG/DOSE, 8 MG/3ML SOPN INJECT SUBCUTANEOUSLY 2 MG ONCE  A WEEK 9 mL 3   spironolactone (ALDACTONE) 25 MG tablet TAKE 1 TABLET BY MOUTH DAILY 100 tablet 2      No current facility-administered medications for this visit.      Imaging Results (Last 48 hours)  No results found.     Review of Systems:   A ROS was  performed including pertinent positives and negatives as documented in the HPI.   Physical Exam :   Constitutional: NAD and appears stated age Neurological: Alert and oriented Psych: Appropriate affect and cooperative There were no vitals taken for this visit.    Comprehensive Musculoskeletal Exam:     Musculoskeletal Exam      Inspection Right Left  Skin No atrophy or winging No atrophy or winging  Palpation      Tenderness none Glenohumeral  Range of Motion      Flexion (passive) 170 30  Flexion (active) 170 30  Abduction 170 20  ER at the side 70 20  Can reach behind back to T12 Side pocket  Strength        Full Limited  Special Tests      Pseudoparalytic No No  Neurologic      Fires PIN, radial, median, ulnar, musculocutaneous, axillary, suprascapular, long thoracic, and spinal accessory innervated muscles. No abnormal sensibility  Vascular/Lymphatic      Radial Pulse 2+ 2+  Cervical Exam      Patient has symmetric cervical range of motion with negative Spurling's test.  Special Test: Positive pseudoparalysis        Imaging:   Xray (3 views left shoulder): Advanced/end-stage glenohumeral osteoarthritis     I personally reviewed and interpreted the radiographs.     Assessment:   72 y.o. female with advanced left shoulder glenohumeral osteoarthritis.  At this time she has trialed physical therapy with quite limited overhead motion.  Given the fact that she does have essentially pseudoparalysis we did discuss the possibility of shoulder arthroplasty specifically reverse shoulder arthroplasty.  I did describe that given that she is walker dependent that there would be a period where she would need to rely on a cane.  We did discuss specific risks associated with losing her balance such as dislocation of the prosthesis.  After discussion of all of these she is ultimately elected for left reverse shoulder arthroplasty   Plan :     -Plan for left reverse shoulder  arthroplasty       After a lengthy discussion of treatment options, including risks,  benefits, alternatives, complications of surgical and nonsurgical conservative options, the patient elected surgical repair.    The patient  is aware of the material risks  and complications including, but not limited to injury to adjacent structures, neurovascular injury, infection, numbness, bleeding, implant failure, thermal burns, stiffness, persistent pain, failure to heal, disease transmission from allograft, need for further surgery, dislocation, anesthetic risks, blood clots, risks of death,and others. The probabilities of surgical success and failure discussed with patient given their particular co-morbidities.The time and nature of expected rehabilitation and recovery was discussed.The patient's questions were all answered preoperatively.  No barriers to understanding were noted. I explained the natural history of the disease process and Rx rationale.  I explained to the patient what I considered to be reasonable expectations given their personal situation.  The final treatment plan was arrived at through a shared patient decision making process model.         I personally saw and evaluated the patient, and participated in the management and treatment plan.   Huel Cote, MD Attending Physician, Orthopedic Surgery

## 2023-09-20 NOTE — Progress Notes (Signed)
Surgery with Dr. Steward Drone cancelled at [facility] 09/20/23 due to inability to obtain IV prior to surgery. Patient understanding. Dr. Serena Croissant office to reschedule surgery at [facility].

## 2023-09-23 ENCOUNTER — Ambulatory Visit: Payer: Self-pay

## 2023-09-23 ENCOUNTER — Ambulatory Visit: Payer: 59

## 2023-09-23 NOTE — Patient Instructions (Signed)
Visit Information  Thank you for taking time to visit with me today. Please don't hesitate to contact me if I can be of assistance to you.   Following are the goals we discussed today:   Goals Addressed               This Visit's Progress     Diabetes Patient self management (pt-stated)        Patient Goals/Self-Care Activities:  Take medications as prescribed   Attend all scheduled provider appointments Call pharmacy for medication refills 3-7 days in advance of running out of medications Call provider office for new concerns or questions   Lab Results  Component Value Date   HGBA1C 7.0 08/17/2023    Reviewed medications with patient and discussed importance of medication adherence;        Counseled on importance of regular laboratory monitoring as prescribed;        Discussed plans with patient for ongoing care management follow up and provided patient with direct contact information for care management team;      Reviewed scheduled/upcoming provider appointments including     09/20/23  Review of patient status, including review of consultants reports, relevant laboratory and other test results, and medications completed;       Continue to monitor your blood sugars after your surgery and remember that they may rise.          Our next appointment is by telephone on 10/25/23 at 11 am  Please call the care guide team at [phone] if you need to cancel or reschedule your appointment.   If you are experiencing a Mental Health or Behavioral Health Crisis or need someone to talk to, please call 1-800-273-TALK (toll free, 24 hour hotline)  Patient verbalizes understanding of instructions and care plan provided today and agrees to view in MyChart. Active MyChart status and patient understanding of how to access instructions and care plan via MyChart confirmed with patient.     Juanell Fairly RN, BSN, [facility] [employer] blower/designer Phone: [phone]

## 2023-09-23 NOTE — Patient Outreach (Signed)
Care Coordination   Follow Up Visit Note   09/23/2023 Name: [name] MRN: [id number] DOB: Mar 15, 1951  [name] is a 72 y.o. year old female who sees Chambliss, Estill Batten, MD for primary care. I spoke with  [name] by phone today.  What matters to the patients health and wellness today?  I spoke with [name], and she is doing okay. However, she is still experiencing issues with her left shoulder. She was scheduled for surgery, but they were unable to find a vein, so they plan to try again next week. She rates her pain as a 7 out of 10, but her blood sugar levels are stable; her reading was 140 this morning. [name] is eating well and taking her medications, but she is having trouble sleeping because of the pain. I advised her to consider using heat in conjunction with her pain medication. She mentioned that she has a heating pad and will ask a family member to help her apply it.    Goals Addressed               This Visit's Progress     Diabetes Patient self management (pt-stated)        Patient Goals/Self-Care Activities:  Take medications as prescribed   Attend all scheduled provider appointments Call pharmacy for medication refills 3-7 days in advance of running out of medications Call provider office for new concerns or questions   Lab Results  Component Value Date   HGBA1C 7.0 08/17/2023    Reviewed medications with patient and discussed importance of medication adherence;        Counseled on importance of regular laboratory monitoring as prescribed;        Discussed plans with patient for ongoing care management follow up and provided patient with direct contact information for care management team;      Reviewed scheduled/upcoming provider appointments including     09/20/23  Review of patient status, including review of consultants reports, relevant laboratory and other test results, and medications completed;       Continue to monitor your  blood sugars after your surgery and remember that they may rise.          SDOH assessments and interventions completed:  No     Care Coordination Interventions:  Yes, provided   Interventions Today    Flowsheet Row Most Recent Value  Chronic Disease   Chronic disease during today's visit Diabetes  General Interventions   General Interventions Discussed/Reviewed General Interventions Discussed, General Interventions Reviewed  Nutrition Interventions   Nutrition Discussed/Reviewed Nutrition Discussed  Pharmacy Interventions   Pharmacy Dicussed/Reviewed Pharmacy Topics Discussed  Safety Interventions   Safety Discussed/Reviewed Safety Discussed        Follow up plan: Follow up call scheduled for 10/25/23 11 am    Encounter Outcome:  Patient Visit Completed   Juanell Fairly RN, BSN, [facility]   Care Coordinator Phone: [phone]

## 2023-09-26 ENCOUNTER — Encounter (HOSPITAL_COMMUNITY): Payer: Self-pay | Admitting: Orthopaedic Surgery

## 2023-09-26 ENCOUNTER — Other Ambulatory Visit: Payer: Self-pay

## 2023-09-26 ENCOUNTER — Ambulatory Visit (HOSPITAL_BASED_OUTPATIENT_CLINIC_OR_DEPARTMENT_OTHER): Payer: Self-pay | Admitting: Orthopaedic Surgery

## 2023-09-26 DIAGNOSIS — M19012 Primary osteoarthritis, left shoulder: Secondary | ICD-10-CM

## 2023-09-26 NOTE — Progress Notes (Signed)
Anesthesia Chart Review: [name]  Case: [id number] Date/Time: 09/27/23 1415   Procedure: LEFT REVERSE SHOULDER ARTHROPLASTY (Left: Shoulder)   Anesthesia type: Choice   Pre-op diagnosis: LEFT GLENOHUMERAL OSTEOARTHRITIS   Location: [facility] OR ROOM 05 / [facility] OR   Surgeons: Huel Cote, MD       DISCUSSION: Patient is a 72 year old female scheduled for the above procedure. Surgery was planned at [facility], but moved to [facility] Main OR due to in ability to obtain IV prior to surgery.   History includes never smoker, HTN, DM2, HLD, asthma, fatty liver, non-ischemic cardiomyopathy (04/2012, LVEF 20%, normal coronaries; LVEF 50-55% 04/2016), CHF, breast cancer (s/p right partial mastectomy 03/21/15, s/p radiation, glaucoma, osteoarthritis (left TKA 07/22/04, right TKA 05/10/05), cholecystectomy (05/09/02). By notes, had indeterminate HIV blood test in 2016, but seen by ID and "felt secondary to cross reacting antibodies with no further workup felt necessary at that time.)"   Non-ischemic cardiomyopathy with BiV dysfunction in 2013. Normal coronaries by 04/2012 echo, EF 20%. Last visit with cardiologist Dr. Chilton Si was noted to by in June 2017. She had updated testing then including 05/07/16 echo that showed EF 50-55%, no regional wall motion abnormalities, grade 1 diastolic dysfunction, normal RV systolic function, PA peak pressure 34 mmHg. 02/13/16 stress test was low risk.    A1c 7.0% on 08/16/12. On Jardiance, Lantus, Ozempic. Last Ozempic documented as 09/23/23. Last Jardiance reported as 09/24/23.    Anesthesia team to evaluate on the day of surgery.    VS: Ht 5\' 2"  (1.575 m)   Wt 93.9 kg   BMI 37.86 kg/m  BP Readings from Last 3 Encounters:  09/20/23 (!) 178/93  08/17/23 (!) 163/87  06/21/23 138/64   Pulse Readings from Last 3 Encounters:  09/20/23 75  08/17/23 71  06/21/23 60     PROVIDERS: Carney Living, MD is PCP    LABS: Last lab results noted in [facility] include: Lab  Results  Component Value Date   WBC 7.6 12/30/2022   HGB 13.1 12/30/2022   HCT 40.9 12/30/2022   PLT 160 12/30/2022   GLUCOSE 111 (H) 09/15/2023   ALT 18 12/30/2022   AST 22 12/30/2022   NA 138 09/15/2023   K 4.4 09/15/2023   CL 101 09/15/2023   CREATININE 1.22 (H) 09/15/2023   BUN 23 09/15/2023   CO2 24 09/15/2023   HGBA1C 7.0 08/17/2023    IMAGES: CT left shoulder 08/21/23: IMPRESSION: 1. Severe glenohumeral joint osteoarthritis. 2. Moderate degenerative changes of the acromioclavicular joint. 3. Aortic and coronary artery atherosclerosis (ICD10-I70.0).   EKG: 09/15/23: Normal sinus rhythm Left ventricular hypertrophy Borderline ECG When compared with ECG of 10-Aug-2022 09:41, PREVIOUS ECG IS PRESENT No significant change since last tracing Confirmed by Arvilla Meres [phone]) on 09/15/2023 10:51:55 PM   CV: Echo 05/07/16: Study Conclusions  - Left ventricle: The cavity size was normal. Systolic function was    normal. The estimated ejection fraction was in the range of 50%    to 55%. Wall motion was normal; there were no regional wall    motion abnormalities. Doppler parameters are consistent with    abnormal left ventricular relaxation (grade 1 diastolic    dysfunction). Doppler parameters are consistent with elevated    ventricular end-diastolic filling pressure.  - Aortic root: The aortic root was normal in size.  - Mitral valve: Structurally normal valve. Transvalvular velocity    was within the normal range. There was no evidence for stenosis.  There was mild regurgitation.  - Left atrium: The atrium was mildly dilated.  - Right ventricle: The cavity size was normal. Wall thickness was    normal. Systolic function was normal.  - Right atrium: The atrium was normal in size.  - Tricuspid valve: There was mild regurgitation.  - Pulmonic valve: There was no regurgitation.  - Pulmonary arteries: Systolic pressure was within the normal    range. PA peak  pressure: 34 mm Hg (S).  - Inferior vena cava: The vessel was normal in size.  - Pericardium, extracardiac: There was no pericardial effusion.   Impressions:  - When compared to the prior study from 01/08/2016 LVEF has improved,    now low normal estimated at 50-55%. (Comparison: LVEF 30-35%, diffuse hypokinesis 01/08/16)    Nuclear stress test 02/13/16: The left ventricular ejection fraction is mildly decreased (45-54%). There was no ST segment deviation noted during stress. This is a low risk study.   Low risk stress nuclear study with small, mild, fixed basal anterior and inferior defects consistent with attenuation; no ischemia; EF 52 with normal wall motion.    Cardiac cath 05/02/12 Impression: 1. No angiographic evidence of CAD 2. Severe LV systolic dysfunction, global hypokinesis. LVEF 20% 3. Elevated filling pressures   Past Medical History:  Diagnosis Date   Anemia    Arthritis    Asthma    Blood transfusion without reported diagnosis    Patients believes 2015 when she overdosed on "medications"    Breast cancer of lower-outer quadrant of right female breast (HCC) 02/13/2015   Cataract    CHF, acute (HCC) 05/03/2012   Depression    Diabetes mellitus    type 2   Eczema    Fatty liver    Fatty infiltration of liver noted on 03/2012 CT scan   Fibromyalgia    Glaucoma    bilateral   Heart disease    History of kidney stones    w/ hx of hydronephrosis - followed by [facility]   HIV nonspecific serology    2006: indeterminate HIV blood test, seen by ID, felt secondary to cross reacting antibodies with no further workup felt necessary at that time    Hyperlipidemia    Hypertension    Obesity    Personal history of radiation therapy 2016   right   Radiation 05/07/15-06/23/15   Right Breast    Past Surgical History:  Procedure Laterality Date   BREAST BIOPSY Left 02/10/2015   malignant    BREAST LUMPECTOMY Right 03/21/2015   CHOLECYSTECTOMY  2003   CYSTOSCOPY  W/ LITHOLAPAXY / [facility]     LEFT AND RIGHT HEART CATHETERIZATION WITH CORONARY ANGIOGRAM N/A 05/02/2012   Procedure: LEFT AND RIGHT HEART CATHETERIZATION WITH CORONARY ANGIOGRAM;  Surgeon: Kathleene Hazel, MD;  Location: [facility] CATH LAB;  Service: Cardiovascular;  Laterality: N/A;   RADIOACTIVE SEED GUIDED PARTIAL MASTECTOMY WITH AXILLARY SENTINEL LYMPH NODE BIOPSY Right 03/21/2015   Procedure: RIGHT  PARTIAL MASTECTOMY WITH RADIOACTIVE SEED LOCALIZATION RIGHT  AXILLARY SENTINEL LYMPH NODE BIOPSY;  Surgeon: Claud Kelp, MD;  Location:  SURGERY CENTER;  Service: General;  Laterality: Right;   REPLACEMENT TOTAL KNEE BILATERAL  2005 &2006   VASCULAR SURGERY Right 03/15/2013   Ultrasound guided sclerotherapy    MEDICATIONS: No current facility-administered medications for this encounter.    acetaminophen (TYLENOL) 650 MG CR tablet   allopurinol (ZYLOPRIM) 100 MG tablet   atorvastatin (LIPITOR) 40 MG tablet   cholecalciferol (VITAMIN D3) 25 MCG (  1000 UNIT) tablet   colchicine 0.6 MG tablet   dorzolamide (TRUSOPT) 2 % ophthalmic solution   empagliflozin (JARDIANCE) 25 MG TABS tablet   fluticasone furoate-vilanterol (BREO ELLIPTA) 200-25 MCG/ACT AEPB   gabapentin (NEURONTIN) 100 MG capsule   LANTUS SOLOSTAR 100 UNIT/ML Solostar Pen   latanoprost (XALATAN) 0.005 % ophthalmic solution   losartan (COZAAR) 100 MG tablet   metoprolol succinate (TOPROL-XL) 25 MG 24 hr tablet   OZEMPIC, 2 MG/DOSE, 8 MG/3ML SOPN   spironolactone (ALDACTONE) 25 MG tablet   trolamine salicylate (ASPERCREME) 10 % cream   aspirin EC 325 MG tablet   blood glucose meter kit and supplies   Blood Glucose Monitoring Suppl (ONETOUCH VERIO) w/Device KIT   glucose blood (ONETOUCH ULTRA) test strip   glucose blood (ONETOUCH VERIO) test strip   glucose blood (ONETOUCH VERIO) test strip   Insulin Pen Needle (EASY COMFORT PEN NEEDLES) 31G X 8 MM MISC   Lancet Devices (ONETOUCH DELICA PLUS LANCING) MISC   Lancets  (ONETOUCH DELICA PLUS LANCET33G) MISC   Miconazole Nitrate (LOTRIMIN AF) 2 % AERO   ONETOUCH VERIO test strip   oxyCODONE (ROXICODONE) 5 MG immediate release tablet    Shonna Chock, PA-C Surgical Short Stay/Anesthesiology [facility] Phone [phone] [facility] Phone [phone] 09/26/2023 3:19 PM

## 2023-09-26 NOTE — Anesthesia Preprocedure Evaluation (Signed)
Anesthesia Evaluation  Patient identified by MRN, date of birth, ID band Patient awake    Reviewed: Allergy & Precautions, NPO status , Patient's Chart, lab work & pertinent test results, reviewed documented beta blocker date and time   Airway Mallampati: II  TM Distance: >3 FB Neck ROM: Full    Dental  (+) Dental Advisory Given, Missing   Pulmonary asthma    Pulmonary exam normal breath sounds clear to auscultation       Cardiovascular hypertension, Pt. on medications and Pt. on home beta blockers (-) angina +CHF  (-) Past MI Normal cardiovascular exam Rhythm:Regular Rate:Normal     Neuro/Psych  PSYCHIATRIC DISORDERS  Depression     Neuromuscular disease    GI/Hepatic negative GI ROS, Neg liver ROS,,,  Endo/Other  diabetes, Type 2, Insulin Dependent, Oral Hypoglycemic Agents  Obesity   Renal/GU Renal InsufficiencyRenal disease     Musculoskeletal  (+) Arthritis ,  Fibromyalgia -  Abdominal   Peds  Hematology negative hematology ROS (+)   Anesthesia Other Findings Day of surgery medications reviewed with the patient.  Reproductive/Obstetrics                             Anesthesia Physical Anesthesia Plan  ASA: 3  Anesthesia Plan: General   Post-op Pain Management: Tylenol PO (pre-op)* and Regional block*   Induction: Intravenous  PONV Risk Score and Plan: 3 and Dexamethasone and Ondansetron  Airway Management Planned: Oral ETT  Additional Equipment:   Intra-op Plan:   Post-operative Plan: Extubation in OR  Informed Consent: I have reviewed the patients History and Physical, chart, labs and discussed the procedure including the risks, benefits and alternatives for the proposed anesthesia with the patient or authorized representative who has indicated his/her understanding and acceptance.     Dental advisory given  Plan Discussed with: CRNA  Anesthesia Plan Comments:  (Rescheduled to [facility] Main OR due to inability to get IV in [facility]. )       Anesthesia Quick Evaluation

## 2023-09-26 NOTE — Progress Notes (Addendum)
PCP - Dr Pearlean Brownie Cardiologist - none  Chest x-ray - n/a EKG - 09/15/23 Stress Test - 02/13/16 ECHO - 05/07/16 Cardiac Cath - 05/02/12  ICD Pacemaker/Loop - n/a  Sleep Study -  n/a  Diabetes Type 2 Last dose of Jardiance was on 09/24/23.  Last dose of Ozempic was on 09/23/23.     THE MORNING OF SURGERY, take 16 Units Lantus Solostar Insulin.  If your blood sugar is less than 70 mg/dL, you will need to treat for low blood sugar: Treat a low blood sugar (less than 70 mg/dL) with  cup of clear juice (cranberry or apple), 4 glucose tablets, OR glucose gel. Recheck blood sugar in 15 minutes after treatment (to make sure it is greater than 70 mg/dL). If your blood sugar is not greater than 70 mg/dL on recheck, call [phone] for further instructions.  ERAS: Clear liquids til 11:30 AM DOS.  Anesthesia review: Yes  STOP now taking any Aspirin (unless otherwise instructed by your surgeon), Aleve, Naproxen, Ibuprofen, Motrin, Advil, Goody's, BC's, all herbal medications, fish oil, and all vitamins.   Patient was informed to shower tonight and tomorrow morning with an antibacterial soap.  Patient verbalized understanding.  Coronavirus Screening Do you have any of the following symptoms:  Cough yes/no: No Fever (>100.33F)  yes/no: No Runny nose yes/no: No Sore throat yes/no: No Difficulty breathing/shortness of breath  yes/no: No  Have you traveled in the last 14 days and where? yes/no: No  Patient verbalized understanding of instructions that were given via phone.

## 2023-09-27 ENCOUNTER — Ambulatory Visit (HOSPITAL_BASED_OUTPATIENT_CLINIC_OR_DEPARTMENT_OTHER): Payer: 59 | Admitting: Vascular Surgery

## 2023-09-27 ENCOUNTER — Ambulatory Visit (HOSPITAL_COMMUNITY)
Admission: RE | Admit: 2023-09-27 | Discharge: 2023-09-27 | Disposition: A | Discharge status: Home / Self Care | Payer: 59 | Attending: Orthopaedic Surgery | Admitting: Orthopaedic Surgery

## 2023-09-27 ENCOUNTER — Encounter (HOSPITAL_COMMUNITY)
Admission: RE | Disposition: A | Discharge status: Home / Self Care | Payer: Self-pay | Source: Home / Self Care | Attending: Orthopaedic Surgery

## 2023-09-27 ENCOUNTER — Ambulatory Visit (HOSPITAL_COMMUNITY): Payer: 59

## 2023-09-27 ENCOUNTER — Encounter (HOSPITAL_COMMUNITY): Payer: Self-pay | Admitting: Orthopaedic Surgery

## 2023-09-27 ENCOUNTER — Other Ambulatory Visit: Payer: Self-pay

## 2023-09-27 ENCOUNTER — Ambulatory Visit (HOSPITAL_COMMUNITY): Payer: 59 | Admitting: Vascular Surgery

## 2023-09-27 DIAGNOSIS — G8918 Other acute postprocedural pain: Secondary | ICD-10-CM | POA: Diagnosis not present

## 2023-09-27 DIAGNOSIS — I5042 Chronic combined systolic (congestive) and diastolic (congestive) heart failure: Secondary | ICD-10-CM | POA: Diagnosis not present

## 2023-09-27 DIAGNOSIS — Z7985 Long-term (current) use of injectable non-insulin antidiabetic drugs: Secondary | ICD-10-CM | POA: Insufficient documentation

## 2023-09-27 DIAGNOSIS — Z8249 Family history of ischemic heart disease and other diseases of the circulatory system: Secondary | ICD-10-CM | POA: Diagnosis not present

## 2023-09-27 DIAGNOSIS — M19012 Primary osteoarthritis, left shoulder: Secondary | ICD-10-CM | POA: Diagnosis not present

## 2023-09-27 DIAGNOSIS — M797 Fibromyalgia: Secondary | ICD-10-CM | POA: Diagnosis not present

## 2023-09-27 DIAGNOSIS — H409 Unspecified glaucoma: Secondary | ICD-10-CM | POA: Insufficient documentation

## 2023-09-27 DIAGNOSIS — K760 Fatty (change of) liver, not elsewhere classified: Secondary | ICD-10-CM | POA: Diagnosis not present

## 2023-09-27 DIAGNOSIS — Z833 Family history of diabetes mellitus: Secondary | ICD-10-CM | POA: Insufficient documentation

## 2023-09-27 DIAGNOSIS — E669 Obesity, unspecified: Secondary | ICD-10-CM | POA: Diagnosis not present

## 2023-09-27 DIAGNOSIS — J45909 Unspecified asthma, uncomplicated: Secondary | ICD-10-CM | POA: Diagnosis not present

## 2023-09-27 DIAGNOSIS — Z6837 Body mass index (BMI) 37.0-37.9, adult: Secondary | ICD-10-CM | POA: Diagnosis not present

## 2023-09-27 DIAGNOSIS — Z7984 Long term (current) use of oral hypoglycemic drugs: Secondary | ICD-10-CM | POA: Diagnosis not present

## 2023-09-27 DIAGNOSIS — R609 Edema, unspecified: Secondary | ICD-10-CM | POA: Diagnosis not present

## 2023-09-27 DIAGNOSIS — I110 Hypertensive heart disease with heart failure: Secondary | ICD-10-CM | POA: Diagnosis not present

## 2023-09-27 DIAGNOSIS — N289 Disorder of kidney and ureter, unspecified: Secondary | ICD-10-CM | POA: Insufficient documentation

## 2023-09-27 DIAGNOSIS — Z96653 Presence of artificial knee joint, bilateral: Secondary | ICD-10-CM | POA: Diagnosis not present

## 2023-09-27 DIAGNOSIS — M7522 Bicipital tendinitis, left shoulder: Secondary | ICD-10-CM | POA: Diagnosis not present

## 2023-09-27 DIAGNOSIS — Z853 Personal history of malignant neoplasm of breast: Secondary | ICD-10-CM | POA: Insufficient documentation

## 2023-09-27 DIAGNOSIS — I509 Heart failure, unspecified: Secondary | ICD-10-CM | POA: Diagnosis not present

## 2023-09-27 DIAGNOSIS — G709 Myoneural disorder, unspecified: Secondary | ICD-10-CM | POA: Insufficient documentation

## 2023-09-27 DIAGNOSIS — E785 Hyperlipidemia, unspecified: Secondary | ICD-10-CM | POA: Diagnosis not present

## 2023-09-27 DIAGNOSIS — E119 Type 2 diabetes mellitus without complications: Secondary | ICD-10-CM | POA: Insufficient documentation

## 2023-09-27 DIAGNOSIS — M1990 Unspecified osteoarthritis, unspecified site: Secondary | ICD-10-CM | POA: Diagnosis not present

## 2023-09-27 DIAGNOSIS — Z79899 Other long term (current) drug therapy: Secondary | ICD-10-CM | POA: Diagnosis not present

## 2023-09-27 DIAGNOSIS — Z794 Long term (current) use of insulin: Secondary | ICD-10-CM | POA: Diagnosis not present

## 2023-09-27 DIAGNOSIS — I428 Other cardiomyopathies: Secondary | ICD-10-CM | POA: Insufficient documentation

## 2023-09-27 DIAGNOSIS — Z96612 Presence of left artificial shoulder joint: Secondary | ICD-10-CM | POA: Diagnosis not present

## 2023-09-27 DIAGNOSIS — Z471 Aftercare following joint replacement surgery: Secondary | ICD-10-CM | POA: Diagnosis not present

## 2023-09-27 HISTORY — PX: REVERSE SHOULDER ARTHROPLASTY: SHX5054

## 2023-09-27 LAB — POCT I-STAT, CHEM 8
BUN: 27 mg/dL — ABNORMAL HIGH (ref 8–23)
Calcium, Ion: 1.2 mmol/L (ref 1.15–1.40)
Chloride: 104 mmol/L (ref 98–111)
Creatinine, Ser: 1.4 mg/dL — ABNORMAL HIGH (ref 0.44–1.00)
Glucose, Bld: 115 mg/dL — ABNORMAL HIGH (ref 70–99)
HCT: 41 % (ref 36.0–46.0)
Hemoglobin: 13.9 g/dL (ref 12.0–15.0)
Potassium: 4.2 mmol/L (ref 3.5–5.1)
Sodium: 139 mmol/L (ref 135–145)
TCO2: 24 mmol/L (ref 22–32)

## 2023-09-27 LAB — GLUCOSE, CAPILLARY
Glucose-Capillary: 99 mg/dL (ref 70–99)
Glucose-Capillary: 173 mg/dL — ABNORMAL HIGH (ref 70–99)
Glucose-Capillary: 127 mg/dL — ABNORMAL HIGH (ref 70–99)

## 2023-09-27 LAB — SURGICAL PCR SCREEN
MRSA, PCR: NEGATIVE
Staphylococcus aureus: POSITIVE — AB

## 2023-09-27 SURGERY — ARTHROPLASTY, SHOULDER, TOTAL, REVERSE
Anesthesia: General | Site: Shoulder | Laterality: Left

## 2023-09-27 MED ORDER — CHLORHEXIDINE GLUCONATE 0.12 % MT SOLN: 15.0000 mL | Freq: Once | OROMUCOSAL | Status: AC

## 2023-09-27 MED ORDER — DEXAMETHASONE SODIUM PHOSPHATE 10 MG/ML IJ SOLN
INTRAMUSCULAR | Status: AC
  Filled 2023-09-27: qty 1

## 2023-09-27 MED ORDER — HYDRALAZINE HCL 20 MG/ML IJ SOLN: 10.0000 mg | Freq: Once | INTRAMUSCULAR | Status: AC

## 2023-09-27 MED ORDER — FENTANYL CITRATE (PF) 250 MCG/5ML IJ SOLN
INTRAMUSCULAR | Status: AC
  Filled 2023-09-27: qty 5

## 2023-09-27 MED ORDER — LIDOCAINE 2% (20 MG/ML) 5 ML SYRINGE
INTRAMUSCULAR | Status: AC
Start: 2023-09-27 — End: ?
  Filled 2023-09-27: qty 5

## 2023-09-27 MED ORDER — BUPIVACAINE LIPOSOME 1.3 % IJ SUSP
INTRAMUSCULAR | Status: AC
  Filled 2023-09-27: qty 10

## 2023-09-27 MED ORDER — ONDANSETRON HCL 4 MG/2ML IJ SOLN
INTRAMUSCULAR | Status: AC
  Administered 2023-09-27: 4 mg via INTRAVENOUS
  Filled 2023-09-27: qty 2

## 2023-09-27 MED ORDER — ONDANSETRON HCL 4 MG/2ML IJ SOLN
INTRAMUSCULAR | Status: AC
  Filled 2023-09-27: qty 2

## 2023-09-27 MED ORDER — ONDANSETRON HCL 4 MG/2ML IJ SOLN: 4.0000 mg | Freq: Once | INTRAMUSCULAR | Status: DC | PRN

## 2023-09-27 MED ORDER — ONDANSETRON HCL 4 MG/2ML IJ SOLN
INTRAMUSCULAR | Status: DC | PRN
  Administered 2023-09-27: 4 mg via INTRAVENOUS

## 2023-09-27 MED ORDER — 0.9 % SODIUM CHLORIDE (POUR BTL) OPTIME
TOPICAL | Status: DC | PRN
  Administered 2023-09-27: 1000 mL

## 2023-09-27 MED ORDER — PHENYLEPHRINE HCL-NACL 20-0.9 MG/250ML-% IV SOLN
INTRAVENOUS | Status: DC | PRN
  Administered 2023-09-27: 30 ug/min via INTRAVENOUS

## 2023-09-27 MED ORDER — FENTANYL CITRATE (PF) 100 MCG/2ML IJ SOLN: 25.0000 ug | INTRAMUSCULAR | Status: DC | PRN

## 2023-09-27 MED ORDER — FENTANYL CITRATE (PF) 100 MCG/2ML IJ SOLN: 50.0000 ug | Freq: Once | INTRAMUSCULAR | Status: AC

## 2023-09-27 MED ORDER — ACETAMINOPHEN 500 MG PO TABS
1000.0000 mg | ORAL_TABLET | Freq: Once | ORAL | Status: AC
  Administered 2023-09-27: 1000 mg via ORAL
  Filled 2023-09-27: qty 2

## 2023-09-27 MED ORDER — METOPROLOL SUCCINATE ER 25 MG PO TB24
25.0000 mg | ORAL_TABLET | Freq: Every day | ORAL | Status: DC
  Administered 2023-09-27: 25 mg via ORAL

## 2023-09-27 MED ORDER — MIDAZOLAM HCL 2 MG/2ML IJ SOLN
INTRAMUSCULAR | Status: AC
  Filled 2023-09-27: qty 2

## 2023-09-27 MED ORDER — SUGAMMADEX SODIUM 200 MG/2ML IV SOLN
INTRAVENOUS | Status: DC | PRN
Start: 2023-09-27 — End: 2023-09-27
  Administered 2023-09-27 (×2): 200 mg via INTRAVENOUS

## 2023-09-27 MED ORDER — TRANEXAMIC ACID-NACL 1000-0.7 MG/100ML-% IV SOLN
1000.0000 mg | INTRAVENOUS | Status: AC
  Administered 2023-09-27: 1000 mg via INTRAVENOUS
  Filled 2023-09-27: qty 100

## 2023-09-27 MED ORDER — VANCOMYCIN HCL 1000 MG IV SOLR
INTRAVENOUS | Status: AC
Start: 2023-09-27 — End: ?
  Filled 2023-09-27: qty 20

## 2023-09-27 MED ORDER — VANCOMYCIN HCL 1000 MG IV SOLR
INTRAVENOUS | Status: DC | PRN
  Administered 2023-09-27: 1000 mg via TOPICAL

## 2023-09-27 MED ORDER — ROCURONIUM BROMIDE 10 MG/ML (PF) SYRINGE
PREFILLED_SYRINGE | INTRAVENOUS | Status: AC
  Filled 2023-09-27: qty 10

## 2023-09-27 MED ORDER — GABAPENTIN 300 MG PO CAPS
300.0000 mg | ORAL_CAPSULE | Freq: Once | ORAL | Status: AC
  Administered 2023-09-27: 300 mg via ORAL
  Filled 2023-09-27: qty 1

## 2023-09-27 MED ORDER — ONDANSETRON HCL 4 MG/2ML IJ SOLN: 4.0000 mg | Freq: Once | INTRAMUSCULAR | Status: AC

## 2023-09-27 MED ORDER — INSULIN ASPART 100 UNIT/ML IJ SOLN: 0.0000 [IU] | INTRAMUSCULAR | Status: DC | PRN

## 2023-09-27 MED ORDER — LIDOCAINE 2% (20 MG/ML) 5 ML SYRINGE
INTRAMUSCULAR | Status: AC
  Filled 2023-09-27: qty 5

## 2023-09-27 MED ORDER — POVIDONE-IODINE 10 % EX SOLN
CUTANEOUS | Status: DC | PRN
  Administered 2023-09-27: 1 via TOPICAL

## 2023-09-27 MED ORDER — ORAL CARE MOUTH RINSE: 15.0000 mL | Freq: Once | OROMUCOSAL | Status: AC

## 2023-09-27 MED ORDER — MIDAZOLAM HCL 2 MG/2ML IJ SOLN
1.0000 mg | Freq: Once | INTRAMUSCULAR | Status: AC
Start: 2023-09-27 — End: 2023-09-27

## 2023-09-27 MED ORDER — BUPIVACAINE HCL (PF) 0.5 % IJ SOLN
INTRAMUSCULAR | Status: DC | PRN
  Administered 2023-09-27: 10 mL via PERINEURAL

## 2023-09-27 MED ORDER — GLYCOPYRROLATE 0.2 MG/ML IJ SOLN
INTRAMUSCULAR | Status: DC | PRN
  Administered 2023-09-27 (×2): .1 mg via INTRAVENOUS

## 2023-09-27 MED ORDER — SODIUM CHLORIDE 0.9 % IR SOLN
Status: DC | PRN
  Administered 2023-09-27: 1000 mL

## 2023-09-27 MED ORDER — CEFAZOLIN SODIUM-DEXTROSE 2-4 GM/100ML-% IV SOLN
2.0000 g | INTRAVENOUS | Status: AC
Start: 2023-09-27 — End: 2023-09-27
  Administered 2023-09-27: 2 g via INTRAVENOUS
  Filled 2023-09-27: qty 100

## 2023-09-27 MED ORDER — CHLORHEXIDINE GLUCONATE 0.12 % MT SOLN
OROMUCOSAL | Status: AC
  Administered 2023-09-27: 15 mL via OROMUCOSAL
  Filled 2023-09-27: qty 15

## 2023-09-27 MED ORDER — HYDRALAZINE HCL 20 MG/ML IJ SOLN
INTRAMUSCULAR | Status: AC
  Administered 2023-09-27: 10 mg via INTRAVENOUS
  Filled 2023-09-27: qty 1

## 2023-09-27 MED ORDER — DEXMEDETOMIDINE HCL IN NACL 80 MCG/20ML IV SOLN
INTRAVENOUS | Status: AC
  Filled 2023-09-27: qty 20

## 2023-09-27 MED ORDER — GLYCOPYRROLATE PF 0.2 MG/ML IJ SOSY
PREFILLED_SYRINGE | INTRAMUSCULAR | Status: AC
Start: 2023-09-27 — End: ?
  Filled 2023-09-27: qty 1

## 2023-09-27 MED ORDER — PROPOFOL 10 MG/ML IV BOLUS
INTRAVENOUS | Status: DC | PRN
  Administered 2023-09-27: 100 mg via INTRAVENOUS

## 2023-09-27 MED ORDER — PROPOFOL 10 MG/ML IV BOLUS
INTRAVENOUS | Status: AC
  Filled 2023-09-27: qty 20

## 2023-09-27 MED ORDER — MIDAZOLAM HCL 2 MG/2ML IJ SOLN
INTRAMUSCULAR | Status: AC
  Administered 2023-09-27: 1 mg via INTRAVENOUS
  Filled 2023-09-27: qty 2

## 2023-09-27 MED ORDER — ROCURONIUM BROMIDE 10 MG/ML (PF) SYRINGE
PREFILLED_SYRINGE | INTRAVENOUS | Status: DC | PRN
  Administered 2023-09-27: 60 mg via INTRAVENOUS

## 2023-09-27 MED ORDER — PHENYLEPHRINE HCL-NACL 20-0.9 MG/250ML-% IV SOLN
INTRAVENOUS | Status: AC
  Filled 2023-09-27: qty 250

## 2023-09-27 MED ORDER — GLYCOPYRROLATE PF 0.2 MG/ML IJ SOSY
PREFILLED_SYRINGE | INTRAMUSCULAR | Status: AC
  Filled 2023-09-27: qty 1

## 2023-09-27 MED ORDER — METOPROLOL SUCCINATE ER 25 MG PO TB24
ORAL_TABLET | ORAL | Status: AC
  Filled 2023-09-27: qty 1

## 2023-09-27 MED ORDER — BUPIVACAINE LIPOSOME 1.3 % IJ SUSP
INTRAMUSCULAR | Status: DC | PRN
  Administered 2023-09-27: 10 mL via PERINEURAL

## 2023-09-27 MED ORDER — SUCCINYLCHOLINE CHLORIDE 200 MG/10ML IV SOSY
PREFILLED_SYRINGE | INTRAVENOUS | Status: AC
Start: 2023-09-27 — End: ?
  Filled 2023-09-27: qty 10

## 2023-09-27 MED ORDER — FENTANYL CITRATE (PF) 100 MCG/2ML IJ SOLN
INTRAMUSCULAR | Status: AC
  Administered 2023-09-27: 50 ug
  Filled 2023-09-27: qty 2

## 2023-09-27 MED ORDER — HYDRALAZINE HCL 20 MG/ML IJ SOLN
INTRAMUSCULAR | Status: DC | PRN
  Administered 2023-09-27: 5 mg via INTRAVENOUS

## 2023-09-27 MED ORDER — LACTATED RINGERS IV SOLN: INTRAVENOUS | Status: DC

## 2023-09-27 MED ORDER — MIDAZOLAM HCL 5 MG/5ML IJ SOLN
INTRAMUSCULAR | Status: DC | PRN
  Administered 2023-09-27: 2 mg via INTRAVENOUS

## 2023-09-27 MED ORDER — LIDOCAINE 2% (20 MG/ML) 5 ML SYRINGE
INTRAMUSCULAR | Status: DC | PRN
  Administered 2023-09-27: 80 mg via INTRAVENOUS

## 2023-09-27 MED ORDER — DEXAMETHASONE SODIUM PHOSPHATE 10 MG/ML IJ SOLN
INTRAMUSCULAR | Status: DC | PRN
  Administered 2023-09-27: 10 mg via INTRAVENOUS

## 2023-09-27 SURGICAL SUPPLY — 74 items
AID PSTN UNV HD RSTRNT DISP (MISCELLANEOUS) ×1
APL PRP STRL LF DISP 70% ISPRP (MISCELLANEOUS)
AUG BASEPLATE 15DEG 25 WEDGE (Joint) ×1 IMPLANT
AUGMENT BASEPLATE 15DEG 25 WDG (Joint) IMPLANT
BAG COUNTER SPONGE SURGICOUNT (BAG) ×2 IMPLANT
BAG SPNG CNTER NS LX DISP (BAG) ×1
BIT DRILL 3.2 PERIPHERAL SCREW (BIT) IMPLANT
BLADE SAW SAG 29X58X.64 (BLADE) IMPLANT
BLADE SAW SGTL 73X25 THK (BLADE) IMPLANT
BSPLAT GLND 15D 25 FULL WDG (Joint) ×1 IMPLANT
CHLORAPREP W/TINT 26 (MISCELLANEOUS) ×2 IMPLANT
COOLER ICEMAN CLASSIC (MISCELLANEOUS) ×2 IMPLANT
COVER SURGICAL LIGHT HANDLE (MISCELLANEOUS) ×2 IMPLANT
DRAPE IMP U-DRAPE 54X76 (DRAPES) ×2 IMPLANT
DRAPE INCISE IOBAN 66X45 STRL (DRAPES) ×2 IMPLANT
DRAPE U-SHAPE 47X51 STRL (DRAPES) ×4 IMPLANT
DRSG AQUACEL AG ADV 3.5X10 (GAUZE/BANDAGES/DRESSINGS) ×2 IMPLANT
ELECT BLADE 4.0 EZ CLEAN MEGAD (MISCELLANEOUS) ×1
ELECT REM PT RETURN 9FT ADLT (ELECTROSURGICAL) ×1
ELECTRODE BLDE 4.0 EZ CLN MEGD (MISCELLANEOUS) ×2 IMPLANT
ELECTRODE REM PT RTRN 9FT ADLT (ELECTROSURGICAL) ×2 IMPLANT
GAUZE PAD ABD 8X10 STRL (GAUZE/BANDAGES/DRESSINGS) IMPLANT
GAUZE SPONGE 4X4 12PLY STRL (GAUZE/BANDAGES/DRESSINGS) IMPLANT
GAUZE XEROFORM 1X8 LF (GAUZE/BANDAGES/DRESSINGS) IMPLANT
GLENOSPHERE REV SHOULDER 36 (Joint) IMPLANT
GLOVE BIO SURGEON STRL SZ7.5 (GLOVE) ×6 IMPLANT
GLOVE BIOGEL PI IND STRL 6.5 (GLOVE) ×2 IMPLANT
GLOVE BIOGEL PI IND STRL 8 (GLOVE) ×4 IMPLANT
GLOVE ECLIPSE 6.0 STRL STRAW (GLOVE) ×2 IMPLANT
GLOVE INDICATOR 8.0 STRL GRN (GLOVE) ×2 IMPLANT
GOWN STRL REUS W/ TWL LRG LVL3 (GOWN DISPOSABLE) ×4 IMPLANT
GOWN STRL REUS W/TWL LRG LVL3 (GOWN DISPOSABLE) ×2
GUIDE PIN 3X75 SHOULDER (PIN) ×2
GUIDEWIRE GLENOID 2.5X220 (WIRE) IMPLANT
HANDPIECE INTERPULSE COAX TIP (DISPOSABLE)
INSERT REVERSED HUMERAL SIZE 1 (Orthopedic Implant) IMPLANT
KIT BASIN OR (CUSTOM PROCEDURE TRAY) ×2 IMPLANT
KIT STABILIZATION SHOULDER (MISCELLANEOUS) ×2 IMPLANT
KIT TURNOVER KIT B (KITS) ×2 IMPLANT
MANIFOLD NEPTUNE II (INSTRUMENTS) ×2 IMPLANT
NDL HYPO 21X1 ECLIPSE (NEEDLE) ×2 IMPLANT
NDL MAYO TROCAR (NEEDLE) ×2 IMPLANT
NEEDLE HYPO 21X1 ECLIPSE (NEEDLE)
NEEDLE MAYO TROCAR (NEEDLE)
NS IRRIG 1000ML POUR BTL (IV SOLUTION) ×2 IMPLANT
PACK SHOULDER (CUSTOM PROCEDURE TRAY) ×2 IMPLANT
PACK UNIVERSAL I (CUSTOM PROCEDURE TRAY) ×2 IMPLANT
PAD ARMBOARD 7.5X6 YLW CONV (MISCELLANEOUS) ×4 IMPLANT
PAD COLD SHLDR WRAP-ON (PAD) ×2 IMPLANT
PIN GUIDE 3X75 SHOULDER (PIN) IMPLANT
RESTRAINT HEAD UNIVERSAL NS (MISCELLANEOUS) ×2 IMPLANT
SCREW 5.5X26 (Screw) IMPLANT
SCREW BONE INTRNL SM 7 (Screw) IMPLANT
SCREW PERIPHERAL 30 (Screw) IMPLANT
SET HNDPC FAN SPRY TIP SCT (DISPOSABLE) ×2 IMPLANT
SLING ARM IMMOBILIZER LRG (SOFTGOODS) ×2 IMPLANT
SPONGE T-LAP 18X18 ~~LOC~~+RFID (SPONGE) ×2 IMPLANT
STAPLER VISISTAT 35W (STAPLE) ×2 IMPLANT
STEM HUMERAL PLUS SHORT SZ2+ (Orthopedic Implant) IMPLANT
SUCTION TUBE FRAZIER 10FR DISP (SUCTIONS) ×2 IMPLANT
SUT ETHILON 3 0 PS 1 (SUTURE) IMPLANT
SUT FIBERWIRE #5 38 CONV NDL (SUTURE)
SUT VIC AB 0 CT1 27 (SUTURE) ×2
SUT VIC AB 0 CT1 27XBRD ANBCTR (SUTURE) ×4 IMPLANT
SUT VIC AB 2-0 CT1 27 (SUTURE) ×2
SUT VIC AB 2-0 CT1 TAPERPNT 27 (SUTURE) ×4 IMPLANT
SUTURE FIBERWR #5 38 CONV NDL (SUTURE) ×4 IMPLANT
SYR 50ML LL SCALE MARK (SYRINGE) ×2 IMPLANT
TAPE CLOTH SOFT 2X10 (GAUZE/BANDAGES/DRESSINGS) IMPLANT
TAPE LABRALWHITE 1.5X36 (TAPE) IMPLANT
TAPE SUT LABRALTAP WHT/BLK (SUTURE) IMPLANT
TOWEL GREEN STERILE (TOWEL DISPOSABLE) ×2 IMPLANT
TRAY FOLEY MTR SLVR 16FR STAT (SET/KITS/TRAYS/PACK) ×2 IMPLANT
WATER STERILE IRR 1000ML POUR (IV SOLUTION) ×2 IMPLANT

## 2023-09-27 NOTE — Transfer of Care (Signed)
Immediate Anesthesia Transfer of Care Note  Patient: [name]  Procedure(s) Performed: LEFT REVERSE SHOULDER ARTHROPLASTY (Left: Shoulder)  Patient Location: PACU  Anesthesia Type:General  Level of Consciousness: awake, alert , and oriented  Airway & Oxygen Therapy: Patient Spontanous Breathing and Patient connected to face mask oxygen  Post-op Assessment: Report given to RN and Post -op Vital signs reviewed and stable  Post vital signs: Reviewed and stable  Last Vitals:  Vitals Value Taken Time  BP 156/90 09/27/23 1745  Temp    Pulse 74 09/27/23 1747  Resp 20 09/27/23 1747  SpO2 100 % 09/27/23 1747  Vitals shown include unfiled device data.  Last Pain:  Vitals:   09/27/23 1315  PainSc: 0-No pain      Patients Stated Pain Goal: 2 (09/27/23 1233)  Complications: No notable events documented.

## 2023-09-27 NOTE — Anesthesia Procedure Notes (Signed)
Anesthesia Regional Block: Interscalene brachial plexus block   Pre-Anesthetic Checklist: , timeout performed,  Correct Patient, Correct Site, Correct Laterality,  Correct Procedure, Correct Position, site marked,  Risks and benefits discussed,  Surgical consent,  Pre-op evaluation,  At surgeon's request and post-op pain management  Laterality: Left  Prep: chloraprep       Needles:  Injection technique: Single-shot  Needle Type: Echogenic Stimulator Needle     Needle Length: 9cm  Needle Gauge: 22     Additional Needles:   Procedures:,,,, ultrasound used (permanent image in chart),,    Narrative:  Start time: 09/27/2023 1:04 PM End time: 09/27/2023 1:14 PM Injection made incrementally with aspirations every 5 mL.  Performed by: Personally  Anesthesiologist: Collene Schlichter, MD  Additional Notes: Functioning IV was confirmed and monitors were applied.  A 90mm 22ga echogenic stimulator needle was used. Sterile prep and drape, hand hygiene, and sterile gloves were used.  Negative aspiration and negative test dose prior to incremental administration of local anesthetic. The patient tolerated the procedure well.  Ultrasound guidance: relevent anatomy identified, needle position confirmed, local anesthetic spread visualized around nerve(s), vascular puncture avoided.  Image printed for medical record.

## 2023-09-27 NOTE — Discharge Instructions (Signed)
     Discharge Instructions    Attending Surgeon: Demri Poulton, MD Office Phone Number: [phone]   Diagnosis and Procedures:    Surgeries Performed: Left shoulder reverse shoulder arthroplasty  Discharge Plan:    Diet: Resume usual diet. Begin with light or bland foods.  Drink plenty of fluids.  Activity:  Keep sling and dressing in place until your follow up visit in Physical Therapy You are advised to go home directly from the hospital or surgical center. Restrict your activities.  GENERAL INSTRUCTIONS: 1.  Keep your surgical site elevated above your heart for at least 5-7 days or longer to prevent swelling. This will improve your comfort and your overall recovery following surgery.     2. Please call Dr. Django Nguyen's office at [phone] with questions Monday-Friday during business hours. If no one answers, please leave a message and someone should get back to the patient within 24 hours. For emergencies please call 911 or proceed to the emergency room.   3. Patient to notify surgical team if experiences any of the following: Bowel/Bladder dysfunction, uncontrolled pain, nerve/muscle weakness, incision with increased drainage or redness, nausea/vomiting and Fever greater than 101.0 F.  Be alert for signs of infection including redness, streaking, odor, fever or chills. Be alert for excessive pain or bleeding and notify your surgeon immediately.  WOUND INSTRUCTIONS:   Leave your dressing/cast/splint in place until your post operative visit.  Keep it clean and dry.  Always keep the incision clean and dry until the staples/sutures are removed. If there is no drainage from the incision you should keep it open to air. If there is drainage from the incision you must keep it covered at all times until the drainage stops  Do not soak in a bath tub, hot tub, pool, lake or other body of water until 21 days after your surgery and your incision is completely dry and healed.  If you  have removable sutures (or staples) they must be removed 10-14 days (unless otherwise instructed) from the day of your surgery.     1)  Elevate the extremity as much as possible.  2)  Keep the dressing clean and dry.  3)  Please call us if the dressing becomes wet or dirty.  4)  If you are experiencing worsening pain or worsening swelling, please call.     MEDICATIONS: Resume all previous home medications at the previous prescribed dose and frequency unless otherwise noted Start taking the  pain medications on an as-needed basis as prescribed  Please taper down pain medication over the next week following surgery.  Ideally you should not require a refill of any narcotic pain medication.  Take pain medication with food to minimize nausea. In addition to the prescribed pain medication, you may take over-the-counter pain relievers such as Tylenol.  Do NOT take additional tylenol if your pain medication already has tylenol in it.  Aspirin 325mg daily for four weeks.      FOLLOWUP INSTRUCTIONS: 1. Follow up at the Physical Therapy Clinic 3-4 days following surgery. This appointment should be scheduled unless other arrangements have been made.The Physical Therapy scheduling number is [phone] if an appointment has not already been arranged.  2. Contact Dr. Naydeline Morace's office during office hours at [phone] or the practice after hours line at [phone] for non-emergencies. For medical emergencies call 911.   Discharge Location: Home  

## 2023-09-27 NOTE — Interval H&P Note (Signed)
History and Physical Interval Note:  09/27/2023 12:38 PM  [name]  has presented today for surgery, with the diagnosis of LEFT GLENOHUMERAL OSTEOARTHRITIS.  The various methods of treatment have been discussed with the patient and family. After consideration of risks, benefits and other options for treatment, the patient has consented to  Procedure(s): LEFT REVERSE SHOULDER ARTHROPLASTY (Left) as a surgical intervention.  The patient's history has been reviewed, patient examined, no change in status, stable for surgery.  I have reviewed the patient's chart and labs.  Questions were answered to the patient's satisfaction.     Huel Cote

## 2023-09-27 NOTE — Op Note (Signed)
Date of Surgery: 09/27/2023  INDICATIONS: Ms. Heidt is a 72 y.o.-year-old female with left end stage osteoarthritis.  The risk and benefits of the procedure were discussed in detail and documented in the pre-operative evaluation.   PREOPERATIVE DIAGNOSIS: 1. Left shoulder glenohumeral osteoarthritis  POSTOPERATIVE DIAGNOSIS: Same.  PROCEDURE: 1. Left shoulder reverse shoulder arthroplasty 2. Left shoulder biceps tenodesis  SURGEON: Benancio Deeds MD  ASSISTANT: Kerby Less, ATC  ANESTHESIA:  general plus interscalene nerve block  IV FLUIDS AND URINE: See anesthesia record.  ANTIBIOTICS: Ancef  ESTIMATED BLOOD LOSS: 50 mL.  IMPLANTS:  Implant Name Type Inv. Item Serial No. Manufacturer Lot No. LRB No. Used Action  BSPLAT GLND 15D 25 FULL WDG - [id number] Joint BSPLAT GLND 15D 25 FULL WDG [id number] [employer]  Left 1 Implanted  SCREW BONE INTRNL SM 7 - [id number] Screw SCREW BONE INTRNL SM 7 [id number] [employer]  Left 1 Implanted  GLENOSPHERE REV SHOULDER 36 - [id number] Joint GLENOSPHERE REV SHOULDER 36 [id number] [employer]  Left 1 Implanted  SCREW 5.5X26 - [id number] Screw SCREW 5.5X26  [employer]  Left 3 Implanted  SCREW PERIPHERAL 30 - [id number] Screw SCREW PERIPHERAL 30  [employer]  Left 1 Implanted  INSERT REVERSED HUMERAL SIZE 1 - [id number] Orthopedic Implant INSERT REVERSED HUMERAL SIZE 1 [id number] [employer]  Left 1 Implanted  STEM HUMERAL PLUS SHORT SZ2+ - [id number] Orthopedic Implant STEM HUMERAL PLUS SHORT SZ2+ [id number] [employer]  Left 1 Implanted    DRAINS: None  CULTURES: None  COMPLICATIONS: none  DESCRIPTION OF PROCEDURE:   Patient was identified in the preoperative holding area.  Anesthesia performed an interscalene nerve block after universal timeout was performed with nursing.  Ancef was given 1 hour prior to skin incision.    The surgical site was scrubbed with a chlorhexidine scrub brush and alcohol.  The patient was then  prepped with chlorhexidine skin prep.  The patient was subsequently taken back to the operating room.  Anesthesia was induced. The patient was transferred to the beachchair position.  All bony prominences were padded.  Final timeout was again performed.     The bony landmarks of the shoulder were marked with a marking pen. A delto-pectoral incision was made, extending up approximately 5 inches. The wound with then irrigated with dilute betadine. Cephalic vein was identified, and an protected. This was retracted medially. Subdeltoid and subpectoral lesions were released. Neurovascular structures were carefully protected. The Gelpi retractor was used to retract the deltoid and pectoralis major.    The deltoid was retracted laterally with a Brown humeral retractor.  The conjoined tendon was identified. The cleido-pectoral fascia was excised.  The axillary nerve was palpated and carefully protected throughout the procedure. The biceps tendon was found and tenodesed to the upper pec with # 2 Ethibond non-absorbable suture.  Proximally the biceps tendon was removed up to the joint.  The bicipital groove was used for a landmark to establish rotator cuff interval. The subscap was tagged with a #2 Ethibond.  At this point the subscapularis was peeled off from the lesser tuberosity with care to avoid dissection distally in order to protect the axillary nerve.  Once the joint was exposed the proximal humerus was delivered with external rotation and extension of the arm. The humerus was prepped initially by performing a humeral neck cut. This was done with the guide using 30 degrees of retroversion as a reference.  The head portion was removed.  A medullary sounding reamer  was then used.  We subsequently placed our guidewire through the center of the humeral head using the reference guide.  This was a size 2.  Metaphyseal reamer was then used.  Finally the size 2+ broach was malleted into place with excellent purchase.  A  tonsil clamp was used to attempt to pull this out with very good purchase   Attention was then turned to the glenoid.  Posteriorly a large Darach retractor was used.  A 360 Degree release of the subscapularis and glenoid were done. The capsule was released from the humerus.    Glenoid retractors were placed posteriorly, superiorly behind the biceps tendon and anteriorly on the glenoid neck. A 360-degree release of the capsule was performed with cautery.  The triceps was released off the inferior tubercle of the glenoid. The axillary nerve was carefully protected with the surgeon's index finger, retracting it and using cautery.   A guidepin was placed through the glenoid guide. The guidepin was drilled until it exited the cortex. The guidepin was over drilled. Next, the glenoid was prepared with the reamer down to cortical bone.  The central peg hole was totally within the scapular neck tested with the probe.  The baseplate was then placed screwed securely with good purchase in position and then secured with 4 screws. In each case, they were drilled and measured and the appropriate length screw placed with excellent rigid fixation of the baseplate. The glenosphere was placed with size based based on pre-operative templating.   The humerus was then delivered and a neutral polyethylene trial was placed.  This was brought to just the level of the reduction but not completely reduced.  A size 0 final poly was selected and impacted.    Appropriate tension was noted on the conjoined tendon and deltoid muscle.  Extension was stable, external and internal rotation as well.  The subscap was pulled over but as this was not able to reach comfortably decision was made not to repair in order to prevent limited in external rotation.  The wound was then irrigated. Vancomycin powder was placed in the wound again for infection prevention.   The wound was then closed in layers with 0 Vicryl interrupted in the deep subcu  followed by 2-0 Vicryl in the superficial subcu and 3-0 nylon for skin.  An Aquacel dressing was applied as well as an Veterinary surgeon.  A shoulder immobilizer was applied.     Postoperative Plan: -The patient will begin the reverse shoulder rehab protocol  -Aspirin 325 mg daily will be used for 4 weeks for blood clot prevention -I will see the patient back in 2 weeks for first postoperative wound check     Benancio Deeds, MD 5:41 PM

## 2023-09-27 NOTE — Brief Op Note (Signed)
   Brief Op Note  Date of Surgery: 09/27/2023  Preoperative Diagnosis: LEFT GLENOHUMERAL OSTEOARTHRITIS  Postoperative Diagnosis: same  Procedure: Procedure(s): LEFT REVERSE SHOULDER ARTHROPLASTY  Implants: Implant Name Type Inv. Item Serial No. Manufacturer Lot No. LRB No. Used Action  BSPLAT GLND 15D 25 FULL WDG - [id number] Joint BSPLAT GLND 15D 25 FULL WDG [id number] [employer]  Left 1 Implanted  SCREW BONE INTRNL SM 7 - [id number] Screw SCREW BONE INTRNL SM 7 [id number] [employer]  Left 1 Implanted  GLENOSPHERE REV SHOULDER 36 - [id number] Joint GLENOSPHERE REV SHOULDER 36 [id number] [employer]  Left 1 Implanted  SCREW 5.5X26 - [id number] Screw SCREW 5.5X26  [employer]  Left 3 Implanted  SCREW PERIPHERAL 30 - [id number] Screw SCREW PERIPHERAL 30  [employer]  Left 1 Implanted  INSERT REVERSED HUMERAL SIZE 1 - [id number] Orthopedic Implant INSERT REVERSED HUMERAL SIZE 1 [id number] [employer]  Left 1 Implanted  STEM HUMERAL PLUS SHORT SZ2+ - [id number] Orthopedic Implant STEM HUMERAL PLUS SHORT SZ2+ [id number] [employer]  Left 1 Implanted    Surgeons: Surgeon(s): Huel Cote, MD  Anesthesia: General    Estimated Blood Loss: See anesthesia record  Complications: None  Condition to PACU: Stable  Benancio Deeds, MD 09/27/2023 5:41 PM

## 2023-09-27 NOTE — Anesthesia Procedure Notes (Addendum)
Procedure Name: Intubation Date/Time: 09/27/2023 3:45 PM  Performed by: Floydene Flock, CRNAPre-anesthesia Checklist: Patient identified, Emergency Drugs available, Suction available, Patient being monitored and Timeout performed Patient Re-evaluated:Patient Re-evaluated prior to induction Oxygen Delivery Method: Circle system utilized Preoxygenation: Pre-oxygenation with 100% oxygen Induction Type: IV induction and Cricoid Pressure applied Ventilation: Mask ventilation without difficulty Laryngoscope Size: Mac and 3 Grade View: Grade II Tube size: 7.5 mm Number of attempts: 1 Airway Equipment and Method: Stylet Placement Confirmation: ETT inserted through vocal cords under direct vision, positive ETCO2, CO2 detector and breath sounds checked- equal and bilateral Secured at: 22 cm Tube secured with: Tape Dental Injury: Teeth and Oropharynx as per pre-operative assessment

## 2023-09-28 ENCOUNTER — Encounter (HOSPITAL_COMMUNITY): Payer: Self-pay | Admitting: Orthopaedic Surgery

## 2023-09-28 ENCOUNTER — Telehealth: Payer: Self-pay

## 2023-09-28 NOTE — Anesthesia Postprocedure Evaluation (Signed)
Anesthesia Post Note  Patient: [name]  Procedure(s) Performed: LEFT REVERSE SHOULDER ARTHROPLASTY (Left: Shoulder)     Patient location during evaluation: PACU Anesthesia Type: General Level of consciousness: awake and alert Pain management: pain level controlled Vital Signs Assessment: post-procedure vital signs reviewed and stable Respiratory status: spontaneous breathing, nonlabored ventilation, respiratory function stable and patient connected to nasal cannula oxygen Cardiovascular status: blood pressure returned to baseline and stable Postop Assessment: no apparent nausea or vomiting Anesthetic complications: no   No notable events documented.  Last Vitals:  Vitals:   09/27/23 1800 09/27/23 1815  BP: (!) 143/83 133/79  Pulse: 75 75  Resp: (!) 21 15  Temp:  36.6 C  SpO2: 98% 98%    Last Pain:  Vitals:   09/27/23 1815  PainSc: 0-No pain                 Collene Schlichter

## 2023-09-28 NOTE — Patient Outreach (Signed)
Care Coordination   Follow Up Visit Note   09/28/2023 Name: [name] MRN: [id number] DOB: 04-Nov-1951  [name] is a 72 y.o. year old female who sees Chambliss, Estill Batten, MD for primary care. I spoke with  [name] by phone today.  What matters to the patients health and wellness today?  I received a call from Mrs. Derrell Lolling, who expressed the need for assistance with her medication at home. Due to a change in her daughter's work schedule, she cannot provide the necessary support. Mrs. Cody is requesting the services of a home health nurse to assist her, as she has recently undergone surgery on her left shoulder. She asked me to send a communication to her surgeon which I facilitated.     SDOH assessments and interventions completed:  No     Care Coordination Interventions:  Yes, provided   Interventions Today    Flowsheet Row Most Recent Value  Chronic Disease   Chronic disease during today's visit Other  [Left shoulder Surgeery]  General Interventions   General Interventions Discussed/Reviewed General Interventions Discussed, Communication with  Communication with PCP/Specialists     .   Follow Up Plan: RNCM will follow up at the next scheduled Interval.  Encounter Outcome:  Patient Visit Completed   Juanell Fairly RN, BSN, [facility]  [facility], [facility]  Care Coordinator Phone: [phone]

## 2023-10-03 ENCOUNTER — Other Ambulatory Visit: Payer: Self-pay | Admitting: Orthopaedic Surgery

## 2023-10-03 ENCOUNTER — Telehealth: Payer: Self-pay | Admitting: *Deleted

## 2023-10-03 ENCOUNTER — Encounter (HOSPITAL_BASED_OUTPATIENT_CLINIC_OR_DEPARTMENT_OTHER): Payer: 59 | Admitting: Orthopaedic Surgery

## 2023-10-03 MED ORDER — OXYCODONE HCL 5 MG PO TABS: 5.0000 mg | ORAL_TABLET | ORAL | 0 refills | Status: DC | PRN

## 2023-10-03 MED ORDER — ACETAMINOPHEN 500 MG PO TABS: 500.0000 mg | ORAL_TABLET | Freq: Three times a day (TID) | ORAL | 0 refills | Status: AC

## 2023-10-03 NOTE — Telephone Encounter (Signed)
Following this patient of yours. Daughter called me over the weekend asking about the dressing b/c it was coming up. She changed it and it's covered. She mentioned needing refill of Oxycodone as well as Tylenol sent to pharmacy. Pharmacy on chart-[facility]. Also we discussed HH needs. Daughter wants someone to come bathe and dress her and sit with her on days she isn't there. I told her insurance just doesn't pay for that. I can still work on HHPT with an aide, but it's not guaranteed that the aide would even be able to see her (maybe 1 x weekly) before she comes in and has to go to OPPT. Just FYI. They were confused thinking insurance is going to cover a paid CG. Thanks.

## 2023-10-10 ENCOUNTER — Ambulatory Visit (HOSPITAL_BASED_OUTPATIENT_CLINIC_OR_DEPARTMENT_OTHER): Payer: 59

## 2023-10-10 ENCOUNTER — Ambulatory Visit (HOSPITAL_BASED_OUTPATIENT_CLINIC_OR_DEPARTMENT_OTHER): Payer: 59 | Admitting: Orthopaedic Surgery

## 2023-10-10 DIAGNOSIS — M19012 Primary osteoarthritis, left shoulder: Secondary | ICD-10-CM

## 2023-10-10 DIAGNOSIS — Z471 Aftercare following joint replacement surgery: Secondary | ICD-10-CM | POA: Diagnosis not present

## 2023-10-10 DIAGNOSIS — Z96612 Presence of left artificial shoulder joint: Secondary | ICD-10-CM | POA: Diagnosis not present

## 2023-10-10 NOTE — Progress Notes (Signed)
Post Operative Evaluation    Procedure/Date of Surgery: Left reverse shoulder arthroplasty 10/29  Interval History:    Presents today 2 weeks status post left reverse shoulder arthroplasty.  He has not been able to have home therapy begin working on range of motion.  Overall her pain is very well-controlled.   PMH/PSH/Family History/Social History/Meds/Allergies:    Past Medical History:  Diagnosis Date   Anemia    Arthritis    Asthma    Blood transfusion without reported diagnosis    Patients believes 2015 when she overdosed on "medications"    Breast cancer of lower-outer quadrant of right female breast (HCC) 02/13/2015   Cataract    CHF, acute (HCC) 05/03/2012   Depression    Diabetes mellitus    type 2   Eczema    Fatty liver    Fatty infiltration of liver noted on 03/2012 CT scan   Fibromyalgia    Glaucoma    bilateral   Heart disease    History of kidney stones    w/ hx of hydronephrosis - followed by [facility]   HIV nonspecific serology    2006: indeterminate HIV blood test, seen by ID, felt secondary to cross reacting antibodies with no further workup felt necessary at that time    Hyperlipidemia    Hypertension    Obesity    Personal history of radiation therapy 2016   right   Radiation 05/07/15-06/23/15   Right Breast   Past Surgical History:  Procedure Laterality Date   BREAST BIOPSY Left 02/10/2015   malignant    BREAST LUMPECTOMY Right 03/21/2015   CHOLECYSTECTOMY  2003   CYSTOSCOPY W/ LITHOLAPAXY / [facility]     LEFT AND RIGHT HEART CATHETERIZATION WITH CORONARY ANGIOGRAM N/A 05/02/2012   Procedure: LEFT AND RIGHT HEART CATHETERIZATION WITH CORONARY ANGIOGRAM;  Surgeon: Kathleene Hazel, MD;  Location: [facility] CATH LAB;  Service: Cardiovascular;  Laterality: N/A;   RADIOACTIVE SEED GUIDED PARTIAL MASTECTOMY WITH AXILLARY SENTINEL LYMPH NODE BIOPSY Right 03/21/2015   Procedure: RIGHT  PARTIAL MASTECTOMY WITH  RADIOACTIVE SEED LOCALIZATION RIGHT  AXILLARY SENTINEL LYMPH NODE BIOPSY;  Surgeon: Claud Kelp, MD;  Location: [facility];  Service: General;  Laterality: Right;   REPLACEMENT TOTAL KNEE BILATERAL  2005 &2006   REVERSE SHOULDER ARTHROPLASTY Left 09/27/2023   Procedure: LEFT REVERSE SHOULDER ARTHROPLASTY;  Surgeon: Huel Cote, MD;  Location: [facility] OR;  Service: Orthopedics;  Laterality: Left;   VASCULAR SURGERY Right 03/15/2013   Ultrasound guided sclerotherapy   Social History   Socioeconomic History   Marital status: Widowed    Spouse name: [name]   Number of children: 3   Years of education: 14   Highest education level: Associate degree: occupational, Scientist, product/process development, or vocational program  Occupational History   Occupation: retired-CNA, Equities trader: RETIRED   Occupation: Engineer, site  Tobacco Use   Smoking status: Never   Smokeless tobacco: Never  Vaping Use   Vaping status: Never Used  Substance and Sexual Activity   Alcohol use: No    Alcohol/week: 0.0 standard drinks of alcohol   Drug use: No   Sexual activity: Not Currently    Birth control/protection: Post-menopausal  Other Topics Concern   Not on file  Social History Narrative   Emergency Contact: son, Hazle Mcnair  Montez Hageman [phone]   Patient lives is single story home with daughter, [name] and grandddaughter, [name]. Ramp to front door, 3 steps in back. Home has smoke detectors, no throw rugs. No grab bars in bathroom. No pets.   Diet: Pt has a varied diet.  Reports eating 2 meals a day.    Exercise: walks daily 15-20 mins; not as much due to possible irritable bowel; does exercise at church once weekly. Serves on [employer] at [employer] and as a Chief Strategy Officer at [employer].   Seatbelts: Pt reports wearing seatbelt when in vehicles. Does not drive.   Hobbies: word searches, church, time with family and friends, cooking, walking when she can; going out shopping.   Drinks occasional soda;  drinks a lot of water and some kool-aid; does drink tea      *Update as of 04/19/2019*   Patient has not able to walk around her neighborhood, or down her street, due to COVID. Patient is eager to get back to this once she feels "safe" to leave her house.    Social Determinants of Health   Financial Resource Strain: Low Risk  (07/09/2023)   Overall Financial Resource Strain (CARDIA)    Difficulty of Paying Living Expenses: Not hard at all  Food Insecurity: No Food Insecurity (07/09/2023)   Hunger Vital Sign    Worried About Running Out of Food in the Last Year: Never true    Ran Out of Food in the Last Year: Never true  Transportation Needs: No Transportation Needs (07/09/2023)   PRAPARE - Administrator, Civil Service (Medical): No    Lack of Transportation (Non-Medical): No  Physical Activity: Insufficiently Active (07/09/2023)   Exercise Vital Sign    Days of Exercise per Week: 2 days    Minutes of Exercise per Session: 30 min  Stress: No Stress Concern Present (07/09/2023)   [employer] of Occupational Health - Occupational Stress Questionnaire    Feeling of Stress : Not at all  Social Connections: Moderately Integrated (07/09/2023)   Social Connection and Isolation Panel [NHANES]    Frequency of Communication with Friends and Family: More than three times a week    Frequency of Social Gatherings with Friends and Family: Three times a week    Attends Religious Services: More than 4 times per year    Active Member of Clubs or Organizations: Yes    Attends Banker Meetings: More than 4 times per year    Marital Status: Widowed   Family History  Problem Relation Age of Onset   Diabetes Mother    Stroke Mother    Heart disease Father    Anemia Father    Esophageal cancer Sister    Cancer Sister 17       nose cancer    Diabetes Sister        2 sisters type 1 diabetes    Kidney disease Sister        waiting for a kidney transplant- on dialysis    Diabetes Brother        Type 2 diabetic    Diabetes Son        Type 2    Hypertension Son    Cancer Cousin 30       ovarian cancer    Cancer Cousin 49       ovarian cancer    Colon cancer Neg Hx    Rectal cancer Neg Hx    Stomach cancer Neg Hx  No Known Allergies Current Outpatient Medications  Medication Sig Dispense Refill   acetaminophen (TYLENOL) 500 MG tablet Take 1 tablet (500 mg total) by mouth every 8 (eight) hours for 10 days. 30 tablet 0   acetaminophen (TYLENOL) 650 MG CR tablet Take 650-1,300 mg by mouth See admin instructions. Take 1300 mg by mouth in the morning, 650 mg in the afternoon and 1300 mg at night     allopurinol (ZYLOPRIM) 100 MG tablet TAKE 1 TABLET BY MOUTH DAILY 100 tablet 2   aspirin EC 325 MG tablet Take 1 tablet (325 mg total) by mouth daily. 14 tablet 0   atorvastatin (LIPITOR) 40 MG tablet TAKE 1 TABLET BY MOUTH DAILY 100 tablet 2   blood glucose meter kit and supplies Dispense based on patient and insurance preference.Use to check blood sugar in the morning. (FOR ICD-10 E10.9, E11.9).  Lum Babe is what she has been using. 1 each 0   Blood Glucose Monitoring Suppl (ONETOUCH VERIO) w/Device KIT Use to check blood glucose every morning. E11.9 1 kit 0   cholecalciferol (VITAMIN D3) 25 MCG (1000 UNIT) tablet Take 2,000 Units by mouth daily.     colchicine 0.6 MG tablet TAKE 1 TABLET BY MOUTH DAILY (Patient taking differently: Take 0.6 mg by mouth at bedtime.) 100 tablet 2   dorzolamide (TRUSOPT) 2 % ophthalmic solution Place 1 drop into both eyes 2 (two) times daily.     empagliflozin (JARDIANCE) 25 MG TABS tablet Take 1 tablet (25 mg total) by mouth daily. 100 tablet 2   fluticasone furoate-vilanterol (BREO ELLIPTA) 200-25 MCG/ACT AEPB Inhale 1 puff into the lungs daily. (Patient taking differently: Inhale 1 puff into the lungs at bedtime.) 60 each 3   gabapentin (NEURONTIN) 100 MG capsule TAKE 1 CAPSULE BY MOUTH AT  BEDTIME 100 capsule 1   glucose  blood (ONETOUCH ULTRA) test strip USE TO CHECK BLOOD SUGAR EVERY MORNING 100 each 2   glucose blood (ONETOUCH VERIO) test strip Use as instructed 100 each 12   glucose blood (ONETOUCH VERIO) test strip CHECK BLOOD SUGAR IN THE  MORNING AS DIRECTED 100 strip 3   Insulin Pen Needle (EASY COMFORT PEN NEEDLES) 31G X 8 MM MISC USE TO INJECT INSULIN EVERY DAY AS DIRECTED 100 each 3   Lancet Devices (ONETOUCH DELICA PLUS LANCING) MISC USE ONCE DAILY 100 each 3   Lancets (ONETOUCH DELICA PLUS LANCET33G) MISC USE DAILY AS NEEDED. 100 each prn   LANTUS SOLOSTAR 100 UNIT/ML Solostar Pen INJECT 32 UNITS EACH MORNING 30 mL 6   latanoprost (XALATAN) 0.005 % ophthalmic solution Place 1 drop into both eyes at bedtime.   0   losartan (COZAAR) 100 MG tablet TAKE 1 TABLET BY MOUTH AT  BEDTIME 100 tablet 2   metoprolol succinate (TOPROL-XL) 25 MG 24 hr tablet Take 12.5 mg by mouth daily.     Miconazole Nitrate (LOTRIMIN AF) 2 % AERO Spray between toes once daily for 4 weeks. (Patient not taking: Reported on 09/21/2023) 150 g 1   ONETOUCH VERIO test strip CHECK BLOOD SUGAR IN THE MORNING AS DIRECTED 100 strip 2   oxyCODONE (ROXICODONE) 5 MG immediate release tablet Take 1 tablet (5 mg total) by mouth every 4 (four) hours as needed for severe pain or breakthrough pain. (Patient not taking: Reported on 09/21/2023) 10 tablet 0   oxyCODONE (ROXICODONE) 5 MG immediate release tablet Take 1 tablet (5 mg total) by mouth every 4 (four) hours as needed for severe pain (pain  score 7-10) or breakthrough pain. 30 tablet 0   OZEMPIC, 2 MG/DOSE, 8 MG/3ML SOPN INJECT SUBCUTANEOUSLY 2 MG ONCE  A WEEK 9 mL 3   spironolactone (ALDACTONE) 25 MG tablet TAKE 1 TABLET BY MOUTH DAILY 100 tablet 2   trolamine salicylate (ASPERCREME) 10 % cream Apply 1 Application topically as needed for muscle pain.     No current facility-administered medications for this visit.   No results found.  Review of Systems:   A ROS was performed including  pertinent positives and negatives as documented in the HPI.   Musculoskeletal Exam:    There were no vitals taken for this visit.  Left shoulder incision is well-appearing without erythema or drainage.  Sensation is intact in axillary distribution equal to the contralateral side.  She does fire all 3 heads of the deltoid with forward elevation.  Forward elevation is to 20 degrees actively with external rotation of the side to neutral.  Imaging:    2 views left shoulder: Status post reverse shoulder arthroplasty without evidence of complication  I personally reviewed and interpreted the radiographs.   Assessment:   2-week status post reverse shoulder arthroplasty without complication.  This time I would like her to work particular with outpatient physical therapy to work on regaining her forward flexion and abduction as this is quite limited.  She will continue to work on this.  I will plan to see him back in 4 weeks for reassessment  Plan :    -Return to clinic 4 weeks for reassessment      I personally saw and evaluated the patient, and participated in the management and treatment plan.  Huel Cote, MD Attending Physician, Orthopedic Surgery  This document was dictated using Dragon voice recognition software. A reasonable attempt at proof reading has been made to minimize errors.

## 2023-10-18 NOTE — Therapy (Unsigned)
OUTPATIENT PHYSICAL THERAPY SHOULDER EVALUATION   Patient Name: [name] MRN: [id number] DOB:1951/03/11, 72 y.o., female Today's Date: 10/19/2023  END OF SESSION:  PT End of Session - 10/19/23 1123     Visit Number 1    Number of Visits 16    Date for PT Re-Evaluation 12/19/23    Authorization Type [employer] MCR    PT Start Time 1045    PT Stop Time 1130    PT Time Calculation (min) 45 min    Activity Tolerance Patient limited by pain    Behavior During Therapy [facility] for tasks assessed/performed             Past Medical History:  Diagnosis Date   Anemia    Arthritis    Asthma    Blood transfusion without reported diagnosis    Patients believes 2015 when she overdosed on "medications"    Breast cancer of lower-outer quadrant of right female breast (HCC) 02/13/2015   Cataract    CHF, acute (HCC) 05/03/2012   Depression    Diabetes mellitus    type 2   Eczema    Fatty liver    Fatty infiltration of liver noted on 03/2012 CT scan   Fibromyalgia    Glaucoma    bilateral   Heart disease    History of kidney stones    w/ hx of hydronephrosis - followed by [facility]   HIV nonspecific serology    2006: indeterminate HIV blood test, seen by ID, felt secondary to cross reacting antibodies with no further workup felt necessary at that time    Hyperlipidemia    Hypertension    Obesity    Personal history of radiation therapy 2016   right   Radiation 05/07/15-06/23/15   Right Breast   Past Surgical History:  Procedure Laterality Date   BREAST BIOPSY Left 02/10/2015   malignant    BREAST LUMPECTOMY Right 03/21/2015   CHOLECYSTECTOMY  2003   CYSTOSCOPY W/ LITHOLAPAXY / [facility]     LEFT AND RIGHT HEART CATHETERIZATION WITH CORONARY ANGIOGRAM N/A 05/02/2012   Procedure: LEFT AND RIGHT HEART CATHETERIZATION WITH CORONARY ANGIOGRAM;  Surgeon: Kathleene Hazel, MD;  Location: [facility] CATH LAB;  Service: Cardiovascular;  Laterality: N/A;   RADIOACTIVE SEED GUIDED PARTIAL  MASTECTOMY WITH AXILLARY SENTINEL LYMPH NODE BIOPSY Right 03/21/2015   Procedure: RIGHT  PARTIAL MASTECTOMY WITH RADIOACTIVE SEED LOCALIZATION RIGHT  AXILLARY SENTINEL LYMPH NODE BIOPSY;  Surgeon: Claud Kelp, MD;  Location: [facility];  Service: General;  Laterality: Right;   REPLACEMENT TOTAL KNEE BILATERAL  2005 &2006   REVERSE SHOULDER ARTHROPLASTY Left 09/27/2023   Procedure: LEFT REVERSE SHOULDER ARTHROPLASTY;  Surgeon: Huel Cote, MD;  Location: [facility] OR;  Service: Orthopedics;  Laterality: Left;   VASCULAR SURGERY Right 03/15/2013   Ultrasound guided sclerotherapy   Patient Active Problem List   Diagnosis Date Noted   Primary osteoarthritis, left shoulder 09/27/2023   Renal disease due to diabetes mellitus (HCC) 04/28/2023   Left shoulder pain 09/06/2022   Left breast mass 11/11/2021   Tinea pedis 04/09/2020   Spinal stenosis of lumbar region without neurogenic claudication 02/08/2019   Gout 05/16/2018   Gait instability 01/03/2018   Tremor of both hands    Stuttering    Essential hypertension    Hyperlipidemia 08/13/2015   Breast cancer of lower-outer quadrant of right female breast (HCC) 02/13/2015   Type 2 diabetes mellitus with diabetic foot deformity (HCC) 01/01/2014   Chronic combined systolic  and diastolic heart failure (HCC) 02/26/2013   OBESITY 02/05/2009   Asthma, intermittent 02/08/2008   Neuropathy due to secondary diabetes (HCC) 05/18/2007   NEPHROLITHIASIS 01/26/2007   DJD (degenerative joint disease), multiple sites 01/26/2007    PCP: Carney Living, MD   REFERRING PROVIDER: Huel Cote, MD  REFERRING DIAG: [id number] (ICD-10-CM) - Primary osteoarthritis, left shoulder  THERAPY DIAG:  Chronic left shoulder pain  Muscle weakness (generalized)  Status post reverse total replacement of left shoulder  Rationale for Evaluation and Treatment: Rehabilitation  ONSET DATE: 09/22/23  SUBJECTIVE:                                                                                                                                                                                       SUBJECTIVE STATEMENT: Reports L shoulder pain, soreness and weakness following L RTSA.  Has been trying to mobilize shoulder on her own.  Denies radicular symptoms. Hand dominance: Right  PERTINENT HISTORY: 2-week status post reverse shoulder arthroplasty without complication. This time I would like her to work particular with outpatient physical therapy to work on regaining her forward flexion and abduction as this is quite limited. She will continue to work on this. I will plan to see him back in 4 weeks for reassessment  PAIN:  Are you having pain? Yes: NPRS scale: 9/10 Pain location: L shoulder Pain description: ache Aggravating factors: activity and weightbearing posiitons Relieving factors: rest and meds    PRECAUTIONS: Shoulder  RED FLAGS: None   WEIGHT BEARING RESTRICTIONS: No  FALLS:  Has patient fallen in last 6 months? No  OCCUPATION: retired  PLOF: Independent with basic ADLs  PATIENT GOALS:To move my shoulder again  NEXT MD VISIT:   OBJECTIVE:  Note: Objective measures were completed at Evaluation unless otherwise noted.  DIAGNOSTIC FINDINGS:  none  PATIENT SURVEYS:  FOTO 38(59 predicted)  POSTURE: Forward and rounded shoulders   UPPER EXTREMITY ROM:   PROM Right eval Left eval  Shoulder flexion  60d  Shoulder extension  30d  Shoulder scaption   60d  Shoulder adduction    Shoulder internal rotation    Shoulder external rotation    Elbow flexion  WFL  Elbow extension  WFL  Wrist flexion    Wrist extension    Wrist ulnar deviation    Wrist radial deviation    Wrist pronation    Wrist supination    (Blank rows = not tested)  UPPER EXTREMITY MMT: Deferred post-op  MMT Right eval Left eval  Shoulder flexion    Shoulder extension    Shoulder abduction    Shoulder adduction    Shoulder  internal  rotation    Shoulder external rotation    Middle trapezius    Lower trapezius    Elbow flexion    Elbow extension    Wrist flexion    Wrist extension    Wrist ulnar deviation    Wrist radial deviation    Wrist pronation    Wrist supination    Grip strength (lbs)    (Blank rows = not tested)  SHOULDER SPECIAL TESTS: Deferred post-op  JOINT MOBILITY TESTING:  Dferred post op  PALPATION:  deferred   TODAY'S TREATMENT:                                                                                                                                         DATE: 10/19/23 Eval and HEP   PATIENT EDUCATION: Education details: Discussed eval findings, rehab rationale and POC and patient is in agreement  Person educated: Patient Education method: Explanation Education comprehension: verbalized understanding and needs further education  HOME EXERCISE PROGRAM: Access Code: [facility] URL: [email or web address] Date: 10/19/2023 Prepared by: Gustavus Bryant  Exercises - Circular Shoulder Pendulum with Table Support  - 5 x daily - 7 x weekly - 10 reps - Supine Shoulder Flexion AAROM with Hands Clasped  - 5 x daily - 7 x weekly - 10 reps - Seated Shoulder Shrugs  - 5 x daily - 7 x weekly - 10 reps - Seated Scapular Retraction  - 5 x daily - 7 x weekly - 10 reps  ASSESSMENT:  CLINICAL IMPRESSION: Patient is a 72 y.o. female who was seen today for physical therapy evaluation and treatment for L shoulder weakness, pain and loss of motion following RTSA. Patient arrived for session holding purse in L hand and advised to avoid in future.  Patient demonstrates decreased L shoulder A/PROM along with strength deficits following surgical procedure.  Patient is good candidate for OPPT.  OBJECTIVE IMPAIRMENTS: decreased activity tolerance, decreased knowledge of condition, decreased mobility, decreased ROM, decreased strength, impaired perceived functional ability, impaired  UE functional use, and pain.   ACTIVITY LIMITATIONS: carrying, lifting, sleeping, bed mobility, and dressing  PERSONAL FACTORS: Age, Fitness, Past/current experiences, Time since onset of injury/illness/exacerbation, and 1-2 comorbidities: Asthma CHF  are also affecting patient's functional outcome.   REHAB POTENTIAL: Good  CLINICAL DECISION MAKING: Stable/uncomplicated  EVALUATION COMPLEXITY: Low   GOALS: Goals reviewed with patient? No  SHORT TERM GOALS: Target date: 11/16/2023    Patient to demonstrate independence in HEP  Baseline:  Goal status: [id number]  2.  Increase PROM to 90d flexion and scaption Baseline:  PROM Right eval Left eval  Shoulder flexion  60d  Shoulder extension  30d  Shoulder scaption   60d   Goal status: INITIAL  3.  6/10 worst pain level Baseline: 9/10 Goal status: INITIAL   LONG TERM GOALS: Target date: 12/14/2023    Patient to demonstrate independence in HEP  Baseline:  [id number] Goal status: INITIAL  2.  Patient will score at least 59% on FOTO to signify clinically meaningful improvement in functional abilities.   Baseline: 38% Goal status: INITIAL  3.  Patient will acknowledge 4/10 pain at least once during episode of care   Baseline: 9/10 Goal status: INITIAL  4.  Patient will be able to reach [facility] to allow more participation in ADLs Baseline:  Goal status: INITIAL   PLAN:  PT FREQUENCY: 2x/week  PT DURATION: 6 weeks  PLANNED INTERVENTIONS: 97164- PT Re-evaluation, 97110-Therapeutic exercises, 97530- Therapeutic activity, 97112- Neuromuscular re-education, 97535- Self Care, 09811- Manual therapy, Patient/Family education, and DME instructions  PLAN FOR NEXT SESSION: HEP review and update, manual techniques as appropriate, aerobic tasks, ROM and flexibility activities, strengthening and PREs, TPDN, gait and balance training as needed    Date of referral: 08/17/23 Referring provider: Huel Cote, MD Referring  diagnosis? M19.012 (ICD-10-CM) - Primary osteoarthritis, left shoulder Treatment diagnosis? (if different than referring diagnosis) M19.012 (ICD-10-CM) - Primary osteoarthritis, left shoulder  What was this (referring dx) caused by? Arthritis  Nature of Condition: Chronic (continuous duration > 3 months)   Laterality: Lt  Current Functional Measure Score: FOTO 38%  Objective measurements identify impairments when they are compared to normal values, the uninvolved extremity, and prior level of function.  [x]  Yes  []  No  Objective assessment of functional ability: Severe functional limitations   Briefly describe symptoms: L shoulder pain and soreness restricting ROM and function  How did symptoms start: degenerative condition  Average pain intensity:  Last 24 hours: 9  Past week: 9  How often does the pt experience symptoms? Frequently  How much have the symptoms interfered with usual daily activities? Extremely  How has condition changed since care began at this facility? NA - initial visit  In general, how is the patients overall health? Fair   BACK PAIN (STarT Back Screening Tool) No   Hildred Laser, PT 10/19/2023, 1:22 PM   REVERSE SHOULDER ARTHROPLASTY REHABILITATION GUIDELINES These guidelines do NOT apply to RTSA for proximal humeral fracture - rehabilitation following PHFx will commence 4-6 weeks after surgery as deemed appropriate to protect tuberosity healing PHASE PRECAUTIONS AND GUIDELINES GOALS EXERCISES CRITERIA TO ADVANCE  EXAMINATION  1 (Post-operative day 1 to post-operative week 3) Sling 24/7 (remove for grooming and home exercise program 3-5x/day)  Avoid combined IR/EXT/ADD (hand behind the back) and IR/ADD (reaching across chest) for dislocation precautions  Pillow behind the upper arm while reclining with sling on   Patient should always be able to see the elbow  Avoid WBing - discuss WBing need with physician and PT  No submersion in water  until after 4 weeks  Ice after HEP as needed  Maintain integrity of joint replacement; protect soft tissue healing  ROM for elevation to 130 and ER to 30   Optimize distal UE circulation and muscle activity (elbow, wrist and hand)  Instruct in use of sling for proper fit  Educate regarding signs/symptoms of infection    Active elbow, wrist and hand  Pendulum  Scapular retraction with arms resting in neutral position  Forward elevation in scapular plane to 130 deg max motion (table slides, step backs, supine well arm assisted)  ER in scapular plane to 30 deg (seated or supine)  ROM within precautionary range limits may be active or passive   Pain less than 3/10 with ROM  Healing incision without signs of infection  Clearance by surgeon to advance after 2 week  post-operative visit Wound assessment  Swelling assessment of upper extremity  Neurovascular assessment of upper extremity  Sling fit and ability to don/doff properly  Patient reported outcome measure  Pain level  Range of motion for elevation and ER   PHASE PRECAUTIONS AND GUIDELINES GOALS EXERCISES CRITERIA TO ADVANCE  EXAMINATION  2 (Post-operative week 3 to 6) May discontinue sling use at 3 weeks; after 2 weeks can remove the sling at home and just use the sling at night and in community for 3rd week  May use arm for basic activities of daily living (such as feeding, brushing teeth, dressing.)   May submerge in water after 4 weeks  Avoid combined IR/EXT/ADD (hand behind the back) and IR/ADD (reaching across chest) for dislocation precautions  Avoid acromial or scapular spine pain as increase deltoid loading - decrease load if this occurs  Elevation to 130 deg and ER to 30 deg - passive, active assisted or active  Low (less than 3/10) to no pain  Ability to fire all heads of the deltoid May discontinue grip, and active elbow and wrist exercises since using the arm in ADLs with sling removed around the  home  Continue elevation to 130 and ER to 30, both in scapular plane   Submaximal isometrics (pain-free effort) for all functional heads of deltoid (anterior, posterior, middle).   Active exercise as able:  Supine forward punch  Place in balanced position with circumduction and progressive arcs in sagittal plane  Side-lying abduction to 90   Lateral raise with bent elbow   Prone extension to hip  Elevation in scapular plane to 130; ER in scapular plane to 30   Ability to fire isometrically all heads of the deltoid muscle without pain  Ability to place and hold the arm in balanced position (90 deg elevation in supine) Wound assessment  Neurovascular assessment  Swelling assessment  ROM shoulder elevation and ER(0)  Patient reported outcome measure  Pain level   PHASE PRECAUTIONS AND GUIDELINES GOALS EXERCISES CRITERIA TO ADVANCE  EXAMINATION  3 (Post-operative week 6 to 12) Avoid forceful end-range motion in any direction   Progress active use of the arm in ADLs without being restricted to arm by the side of the body;   No heavy lifting or carrying  Initiate functional IR behind the back gently without forceful overpressure  Avoid acromial or scapular spine pain as increase deltoid loading - decrease load if this occurs  NO UPPER BODY ERGOMETER  Optimize ROM for elevation and ER in scapular plane   Expected PROM: Elevation - 145-160; ER - 40-50 ; functional IR to L1  Recover AROM to approach as close to PROM available as possible  Establish dynamic stability of the shoulder  Forward elevation in scapular plane active progression: supine to incline to vertical; short to long lever arm  Lateral raise with bent elbow; side-lying abduction  Active ER/IR with arm at side  Scapular retraction with light band resistance  Serratus anterior punches in supine; avoid wall, incline or prone press-ups for serratus anterior  Functional IR with hand slide up back - very  gentle and gradual   AROM equals/approaches PROM with good mechanics for elevation  No pain  Higher level demand on shoulder than ADL functions PROM for elevation, ER(0)  AROM for elevation, ER(0) and functional IR  Patient reported outcome measure  Pain             PHASE PRECAUTIONS AND GUIDELINES GOALS EXERCISES CRITERIA TO ADVANCE  EXAMINATION  4 (Post-operative week 12+) No heavy lifting and no overhead sports  Weight lifting limit 25.lb  No heavy pushing activity  Gradually increase strength   NO UPPER BODY ERGOMETER  Optimize functional use of operative UE to patient specific goals  Gradual increase in deltoid, scapular muscle and rotator cuff strength  Pain-free functional activities Light hand weights for deltoid up to and not to exceed 3 lbs for anterior and posterior with long arm lift against gravity; elbow bent to 90 deg for abduction in scapular plane  Band progression for extension to hip with scapular depression/retraction  Band progression for serratus anterior punches in supine; avoid wall, incline or prone press-ups for serratus anterior  End-range stretching gently without forceful overpressure in all planes (elevation in scapular plane, ER in scapular plane, functional IR) with stretching done for life as part of daily routine  NO UPPER BODY ERGOMETER  Pain-free AROM for shoulder elevation (expect around 135-150 deg)  Functional strength for all ADLs, work tasks, and hobbies approved by [employer], television/film set with home maintenance program PROM for elevation, ER(0); ER(90)  AROM for elevation, ER(0) and functional IR  Scapulohumeral rhythm/biomechanics of active movement strategies  Strength testing for deltoid, RTC, scapular muscles  Patient reported outcome measure  Pain

## 2023-10-19 ENCOUNTER — Other Ambulatory Visit: Payer: Self-pay

## 2023-10-19 ENCOUNTER — Ambulatory Visit: Payer: 59 | Attending: Family Medicine

## 2023-10-19 DIAGNOSIS — M25512 Pain in left shoulder: Secondary | ICD-10-CM | POA: Diagnosis not present

## 2023-10-19 DIAGNOSIS — M19012 Primary osteoarthritis, left shoulder: Secondary | ICD-10-CM | POA: Diagnosis not present

## 2023-10-19 DIAGNOSIS — G8929 Other chronic pain: Secondary | ICD-10-CM | POA: Diagnosis not present

## 2023-10-19 DIAGNOSIS — M6281 Muscle weakness (generalized): Secondary | ICD-10-CM | POA: Diagnosis not present

## 2023-10-19 DIAGNOSIS — Z96612 Presence of left artificial shoulder joint: Secondary | ICD-10-CM | POA: Insufficient documentation

## 2023-10-24 ENCOUNTER — Other Ambulatory Visit: Payer: Self-pay | Admitting: Family Medicine

## 2023-10-24 DIAGNOSIS — E1169 Type 2 diabetes mellitus with other specified complication: Secondary | ICD-10-CM

## 2023-10-25 ENCOUNTER — Ambulatory Visit: Payer: Self-pay

## 2023-10-25 NOTE — Patient Outreach (Signed)
  Care Coordination   Follow Up Visit Note   10/25/2023 Name: [name] MRN: [id number] DOB: [birth year]  ERTHA Smith is a 72 y.o. year old female who sees Chambliss, Estill Batten, MD for primary care. I spoke with  [name] by phone today.  What matters to the patients health and wellness today?  [name] has reported her current blood sugar level to be 150. The measurements for the preceding days are as follows: 124 on the 25th, 164 on the 24th, 156 on the 23nd, and 139 on the 22rd at 125 on the 21st. She has remained indoors for the past month due to her left shoulder surgery; however, she intends to attend church in the near future. The stitches have been removed, and she is actively engaging in her shoulder rehabilitation exercises. I advised her to approach these exercises with caution and to practice moderation. Additionally, I expressed my pride in her progress, noting that her A1C level has decreased from 7.3 to 7.0 as of September 18th.    Goals Addressed               This Visit's Progress     Diabetes Patient self management (pt-stated)        Patient Goals/Self-Care Activities:  Take medications as prescribed   Attend all scheduled provider appointments Call pharmacy for medication refills 3-7 days in advance of running out of medications Call provider office for new concerns or questions   Lab Results  Component Value Date   HGBA1C 7.0 08/17/2023    Reviewed medications with patient and discussed importance of medication adherence;        Counseled on importance of regular laboratory monitoring as prescribed;        Discussed plans with patient for ongoing care management follow up and provided patient with direct contact information for care management team;      Reviewed scheduled/upcoming provider appointments including     09/20/23  Review of patient status, including review of consultants reports, relevant laboratory and other test results, and  medications completed;       Continue to monitor your blood sugars after your surgery and remember that they may rise. Monitor your food and fluid intake            SDOH assessments and interventions completed:  No     Care Coordination Interventions:  Yes, provided   Interventions Today    Flowsheet Row Most Recent Value  Chronic Disease   Chronic disease during today's visit Diabetes  General Interventions   General Interventions Discussed/Reviewed General Interventions Discussed  Pharmacy Interventions   Pharmacy Dicussed/Reviewed Pharmacy Topics Discussed  Safety Interventions   Safety Discussed/Reviewed Safety Discussed        Follow up plan:  12/06/22 11 am     Encounter Outcome:  Patient Visit Completed   Juanell Fairly RN, BSN, [facility]  [facility], [facility]  Care Coordinator Phone: [phone]

## 2023-10-25 NOTE — Patient Instructions (Signed)
Visit Information  Thank you for taking time to visit with me today. Please don't hesitate to contact me if I can be of assistance to you.   Following are the goals we discussed today:   Goals Addressed               This Visit's Progress     Diabetes Patient self management (pt-stated)        Patient Goals/Self-Care Activities:  Take medications as prescribed   Attend all scheduled provider appointments Call pharmacy for medication refills 3-7 days in advance of running out of medications Call provider office for new concerns or questions   Lab Results  Component Value Date   HGBA1C 7.0 08/17/2023    Reviewed medications with patient and discussed importance of medication adherence;        Counseled on importance of regular laboratory monitoring as prescribed;        Discussed plans with patient for ongoing care management follow up and provided patient with direct contact information for care management team;      Reviewed scheduled/upcoming provider appointments including     09/20/23  Review of patient status, including review of consultants reports, relevant laboratory and other test results, and medications completed;       Continue to monitor your blood sugars after your surgery and remember that they may rise. Monitor your food and fluid intake            Our next appointment is by telephone on 12/09/22 at 11 am  Please call the care guide team at [phone] if you need to cancel or reschedule your appointment.   If you are experiencing a Mental Health or Behavioral Health Crisis or need someone to talk to, please call 1-800-273-TALK (toll free, 24 hour hotline)  Patient verbalizes understanding of instructions and care plan provided today and agrees to view in MyChart. Active MyChart status and patient understanding of how to access instructions and care plan via MyChart confirmed with patient.     Juanell Fairly RN, BSN, [facility]  [facility], [facility]  Care Coordinator Phone: [phone]

## 2023-11-02 NOTE — Therapy (Signed)
OUTPATIENT PHYSICAL THERAPY TREATMENT NOTE   Patient Name: [name] MRN: [id number] DOB:December 06, 1950, 72 y.o., female Today's Date: 11/04/2023  END OF SESSION:  PT End of Session - 11/04/23 1004     Visit Number 2    Number of Visits 16    Date for PT Re-Evaluation 12/19/23    Authorization Type [employer] MCR    PT Start Time 1000    PT Stop Time 1045    PT Time Calculation (min) 45 min    Activity Tolerance Patient limited by pain    Behavior During Therapy [facility] for tasks assessed/performed              Past Medical History:  Diagnosis Date   Anemia    Arthritis    Asthma    Blood transfusion without reported diagnosis    Patients believes 2015 when she overdosed on "medications"    Breast cancer of lower-outer quadrant of right female breast (HCC) 02/13/2015   Cataract    CHF, acute (HCC) 05/03/2012   Depression    Diabetes mellitus    type 2   Eczema    Fatty liver    Fatty infiltration of liver noted on 03/2012 CT scan   Fibromyalgia    Glaucoma    bilateral   Heart disease    History of kidney stones    w/ hx of hydronephrosis - followed by [facility]   HIV nonspecific serology    2006: indeterminate HIV blood test, seen by ID, felt secondary to cross reacting antibodies with no further workup felt necessary at that time    Hyperlipidemia    Hypertension    Obesity    Personal history of radiation therapy 2016   right   Radiation 05/07/15-06/23/15   Right Breast   Past Surgical History:  Procedure Laterality Date   BREAST BIOPSY Left 02/10/2015   malignant    BREAST LUMPECTOMY Right 03/21/2015   CHOLECYSTECTOMY  2003   CYSTOSCOPY W/ LITHOLAPAXY / [facility]     LEFT AND RIGHT HEART CATHETERIZATION WITH CORONARY ANGIOGRAM N/A 05/02/2012   Procedure: LEFT AND RIGHT HEART CATHETERIZATION WITH CORONARY ANGIOGRAM;  Surgeon: Kathleene Hazel, MD;  Location: [facility] CATH LAB;  Service: Cardiovascular;  Laterality: N/A;   RADIOACTIVE SEED GUIDED PARTIAL  MASTECTOMY WITH AXILLARY SENTINEL LYMPH NODE BIOPSY Right 03/21/2015   Procedure: RIGHT  PARTIAL MASTECTOMY WITH RADIOACTIVE SEED LOCALIZATION RIGHT  AXILLARY SENTINEL LYMPH NODE BIOPSY;  Surgeon: Claud Kelp, MD;  Location: [facility];  Service: General;  Laterality: Right;   REPLACEMENT TOTAL KNEE BILATERAL  2005 &2006   REVERSE SHOULDER ARTHROPLASTY Left 09/27/2023   Procedure: LEFT REVERSE SHOULDER ARTHROPLASTY;  Surgeon: Huel Cote, MD;  Location: [facility] OR;  Service: Orthopedics;  Laterality: Left;   VASCULAR SURGERY Right 03/15/2013   Ultrasound guided sclerotherapy   Patient Active Problem List   Diagnosis Date Noted   Primary osteoarthritis, left shoulder 09/27/2023   Renal disease due to diabetes mellitus (HCC) 04/28/2023   Left shoulder pain 09/06/2022   Left breast mass 11/11/2021   Tinea pedis 04/09/2020   Spinal stenosis of lumbar region without neurogenic claudication 02/08/2019   Gout 05/16/2018   Gait instability 01/03/2018   Tremor of both hands    Stuttering    Essential hypertension    Hyperlipidemia 08/13/2015   Breast cancer of lower-outer quadrant of right female breast (HCC) 02/13/2015   Type 2 diabetes mellitus with diabetic foot deformity (HCC) 01/01/2014   Chronic combined  systolic and diastolic heart failure (HCC) 02/26/2013   OBESITY 02/05/2009   Asthma, intermittent 02/08/2008   Neuropathy due to secondary diabetes (HCC) 05/18/2007   NEPHROLITHIASIS 01/26/2007   DJD (degenerative joint disease), multiple sites 01/26/2007    PCP: Carney Living, MD   REFERRING PROVIDER: Huel Cote, MD  REFERRING DIAG: [phone] (ICD-10-CM) - Primary osteoarthritis, left shoulder  THERAPY DIAG:  Chronic left shoulder pain  Muscle weakness (generalized)  Status post reverse total replacement of left shoulder  Rationale for Evaluation and Treatment: Rehabilitation  ONSET DATE: 09/22/23  SUBJECTIVE:                                                                                                                                                                                       SUBJECTIVE STATEMENT: First f/u session as she had misunderstood her need to schedule f/u sessions.  Has refrained from carrying her purse on her L side and has been trying to work on mobility.  Currently 5 weeks post-op Hand dominance: Right  PERTINENT HISTORY: 2-week status post reverse shoulder arthroplasty without complication. This time I would like her to work particular with outpatient physical therapy to work on regaining her forward flexion and abduction as this is quite limited. She will continue to work on this. I will plan to see him back in 4 weeks for reassessment  PAIN:  Are you having pain? Yes: NPRS scale: 9/10 Pain location: L shoulder Pain description: ache Aggravating factors: activity and weightbearing posiitons Relieving factors: rest and meds    PRECAUTIONS: Shoulder  RED FLAGS: None   WEIGHT BEARING RESTRICTIONS: No  FALLS:  Has patient fallen in last 6 months? No  OCCUPATION: retired  PLOF: Independent with basic ADLs  PATIENT GOALS:To move my shoulder again  NEXT MD VISIT:   OBJECTIVE:  Note: Objective measures were completed at Evaluation unless otherwise noted.  DIAGNOSTIC FINDINGS:  none  PATIENT SURVEYS:  FOTO 38(59 predicted)  POSTURE: Forward and rounded shoulders   UPPER EXTREMITY ROM:   PROM Right eval Left eval  Shoulder flexion  60d  Shoulder extension  30d  Shoulder scaption   60d  Shoulder adduction    Shoulder internal rotation    Shoulder external rotation    Elbow flexion  WFL  Elbow extension  WFL  Wrist flexion    Wrist extension    Wrist ulnar deviation    Wrist radial deviation    Wrist pronation    Wrist supination    (Blank rows = not tested)  UPPER EXTREMITY MMT: Deferred post-op  MMT Right eval Left eval  Shoulder flexion    Shoulder extension  Shoulder abduction    Shoulder adduction    Shoulder internal rotation    Shoulder external rotation    Middle trapezius    Lower trapezius    Elbow flexion    Elbow extension    Wrist flexion    Wrist extension    Wrist ulnar deviation    Wrist radial deviation    Wrist pronation    Wrist supination    Grip strength (lbs)    (Blank rows = not tested)  SHOULDER SPECIAL TESTS: Deferred post-op  JOINT MOBILITY TESTING:  Dferred post op  PALPATION:  deferred   TODAY'S TREATMENT:            [facility] Adult PT Treatment:                                                DATE: 11/04/23 Therapeutic Exercise: Wall ladder 10x UE ranger seated scaption 10x2 Supine AAROM chest press 10x2 Manual Therapy: PROM 15d ER supine in scaption PROM FF 100d in elevated position Isometrics 3s hold flex/abd/ext 10 reps                                                                                                                                  DATE: 10/19/23 Eval and HEP   PATIENT EDUCATION: Education details: Discussed eval findings, rehab rationale and POC and patient is in agreement  Person educated: Patient Education method: Explanation Education comprehension: verbalized understanding and needs further education  HOME EXERCISE PROGRAM: Access Code: [facility] URL: [email or web address] Date: 10/19/2023 Prepared by: Gustavus Bryant  Exercises - Circular Shoulder Pendulum with Table Support  - 5 x daily - 7 x weekly - 10 reps - Supine Shoulder Flexion AAROM with Hands Clasped  - 5 x daily - 7 x weekly - 10 reps - Seated Shoulder Shrugs  - 5 x daily - 7 x weekly - 10 reps - Seated Scapular Retraction  - 5 x daily - 7 x weekly - 10 reps  ASSESSMENT:  CLINICAL IMPRESSION: Focus of today was PROM and activation of deltoid musculature.  Able to demo increased PROM in FF and ER.  Mobility restricted by marked guarding throughout shoulder girdle.  Patient slightly behind  protocol milestones.  Patient is a 72 y.o. female who was seen today for physical therapy evaluation and treatment for L shoulder weakness, pain and loss of motion following RTSA. Patient arrived for session holding purse in L hand and advised to avoid in future.  Patient demonstrates decreased L shoulder A/PROM along with strength deficits following surgical procedure.  Patient is good candidate for OPPT.  OBJECTIVE IMPAIRMENTS: decreased activity tolerance, decreased knowledge of condition, decreased mobility, decreased ROM, decreased strength, impaired perceived functional ability, impaired UE functional use, and pain.   ACTIVITY LIMITATIONS: carrying, lifting, sleeping, bed  mobility, and dressing  PERSONAL FACTORS: Age, Fitness, Past/current experiences, Time since onset of injury/illness/exacerbation, and 1-2 comorbidities: Asthma CHF  are also affecting patient's functional outcome.   REHAB POTENTIAL: Good  CLINICAL DECISION MAKING: Stable/uncomplicated  EVALUATION COMPLEXITY: Low   GOALS: Goals reviewed with patient? No  SHORT TERM GOALS: Target date: 11/16/2023    Patient to demonstrate independence in HEP  Baseline:  Goal status: [id number]  2.  Increase PROM to 90d flexion and scaption Baseline:  PROM Right eval Left eval  Shoulder flexion  60d  Shoulder extension  30d  Shoulder scaption   60d   Goal status: INITIAL  3.  6/10 worst pain level Baseline: 9/10 Goal status: INITIAL   LONG TERM GOALS: Target date: 12/14/2023    Patient to demonstrate independence in HEP  Baseline: [id number] Goal status: INITIAL  2.  Patient will score at least 59% on FOTO to signify clinically meaningful improvement in functional abilities.   Baseline: 38% Goal status: INITIAL  3.  Patient will acknowledge 4/10 pain at least once during episode of care   Baseline: 9/10 Goal status: INITIAL  4.  Patient will be able to reach [facility] to allow more participation in  ADLs Baseline:  Goal status: INITIAL   PLAN:  PT FREQUENCY: 2x/week  PT DURATION: 6 weeks  PLANNED INTERVENTIONS: 97164- PT Re-evaluation, 97110-Therapeutic exercises, 97530- Therapeutic activity, 97112- Neuromuscular re-education, 97535- Self Care, 16109- Manual therapy, Patient/Family education, and DME instructions  PLAN FOR NEXT SESSION: HEP review and update, manual techniques as appropriate, aerobic tasks, ROM and flexibility activities, strengthening and PREs, TPDN, gait and balance training as needed    Date of referral: 08/17/23 Referring provider: Huel Cote, MD Referring diagnosis? M19.012 (ICD-10-CM) - Primary osteoarthritis, left shoulder Treatment diagnosis? (if different than referring diagnosis) M19.012 (ICD-10-CM) - Primary osteoarthritis, left shoulder  What was this (referring dx) caused by? Arthritis  Nature of Condition: Chronic (continuous duration > 3 months)   Laterality: Lt  Current Functional Measure Score: FOTO 38%  Objective measurements identify impairments when they are compared to normal values, the uninvolved extremity, and prior level of function.  [x]  Yes  []  No  Objective assessment of functional ability: Severe functional limitations   Briefly describe symptoms: L shoulder pain and soreness restricting ROM and function  How did symptoms start: degenerative condition  Average pain intensity:  Last 24 hours: 9  Past week: 9  How often does the pt experience symptoms? Frequently  How much have the symptoms interfered with usual daily activities? Extremely  How has condition changed since care began at this facility? NA - initial visit  In general, how is the patients overall health? Fair   BACK PAIN (STarT Back Screening Tool) No   Hildred Laser, PT 11/04/2023, 11:36 AM   REVERSE SHOULDER ARTHROPLASTY REHABILITATION GUIDELINES These guidelines do NOT apply to RTSA for proximal humeral fracture - rehabilitation following  PHFx will commence 4-6 weeks after surgery as deemed appropriate to protect tuberosity healing PHASE PRECAUTIONS AND GUIDELINES GOALS EXERCISES CRITERIA TO ADVANCE  EXAMINATION  1 (Post-operative day 1 to post-operative week 3) Sling 24/7 (remove for grooming and home exercise program 3-5x/day)  Avoid combined IR/EXT/ADD (hand behind the back) and IR/ADD (reaching across chest) for dislocation precautions  Pillow behind the upper arm while reclining with sling on   Patient should always be able to see the elbow  Avoid WBing - discuss WBing need with physician and PT  No submersion in water until after  4 weeks  Ice after HEP as needed  Maintain integrity of joint replacement; protect soft tissue healing  ROM for elevation to 130 and ER to 30   Optimize distal UE circulation and muscle activity (elbow, wrist and hand)  Instruct in use of sling for proper fit  Educate regarding signs/symptoms of infection    Active elbow, wrist and hand  Pendulum  Scapular retraction with arms resting in neutral position  Forward elevation in scapular plane to 130 deg max motion (table slides, step backs, supine well arm assisted)  ER in scapular plane to 30 deg (seated or supine)  ROM within precautionary range limits may be active or passive   Pain less than 3/10 with ROM  Healing incision without signs of infection  Clearance by surgeon to advance after 2 week post-operative visit Wound assessment  Swelling assessment of upper extremity  Neurovascular assessment of upper extremity  Sling fit and ability to don/doff properly  Patient reported outcome measure  Pain level  Range of motion for elevation and ER   PHASE PRECAUTIONS AND GUIDELINES GOALS EXERCISES CRITERIA TO ADVANCE  EXAMINATION  2 (Post-operative week 3 to 6) May discontinue sling use at 3 weeks; after 2 weeks can remove the sling at home and just use the sling at night and in community for 3rd week  May use arm  for basic activities of daily living (such as feeding, brushing teeth, dressing.)   May submerge in water after 4 weeks  Avoid combined IR/EXT/ADD (hand behind the back) and IR/ADD (reaching across chest) for dislocation precautions  Avoid acromial or scapular spine pain as increase deltoid loading - decrease load if this occurs  Elevation to 130 deg and ER to 30 deg - passive, active assisted or active  Low (less than 3/10) to no pain  Ability to fire all heads of the deltoid May discontinue grip, and active elbow and wrist exercises since using the arm in ADLs with sling removed around the home  Continue elevation to 130 and ER to 30, both in scapular plane   Submaximal isometrics (pain-free effort) for all functional heads of deltoid (anterior, posterior, middle).   Active exercise as able:  Supine forward punch  Place in balanced position with circumduction and progressive arcs in sagittal plane  Side-lying abduction to 90   Lateral raise with bent elbow   Prone extension to hip  Elevation in scapular plane to 130; ER in scapular plane to 30   Ability to fire isometrically all heads of the deltoid muscle without pain  Ability to place and hold the arm in balanced position (90 deg elevation in supine) Wound assessment  Neurovascular assessment  Swelling assessment  ROM shoulder elevation and ER(0)  Patient reported outcome measure  Pain level   PHASE PRECAUTIONS AND GUIDELINES GOALS EXERCISES CRITERIA TO ADVANCE  EXAMINATION  3 (Post-operative week 6 to 12) Avoid forceful end-range motion in any direction   Progress active use of the arm in ADLs without being restricted to arm by the side of the body;   No heavy lifting or carrying  Initiate functional IR behind the back gently without forceful overpressure  Avoid acromial or scapular spine pain as increase deltoid loading - decrease load if this occurs  NO UPPER BODY ERGOMETER  Optimize ROM for elevation  and ER in scapular plane   Expected PROM: Elevation - 145-160; ER - 40-50 ; functional IR to L1  Recover AROM to approach as close to PROM available  as possible  Establish dynamic stability of the shoulder  Forward elevation in scapular plane active progression: supine to incline to vertical; short to long lever arm  Lateral raise with bent elbow; side-lying abduction  Active ER/IR with arm at side  Scapular retraction with light band resistance  Serratus anterior punches in supine; avoid wall, incline or prone press-ups for serratus anterior  Functional IR with hand slide up back - very gentle and gradual   AROM equals/approaches PROM with good mechanics for elevation  No pain  Higher level demand on shoulder than ADL functions PROM for elevation, ER(0)  AROM for elevation, ER(0) and functional IR  Patient reported outcome measure  Pain             PHASE PRECAUTIONS AND GUIDELINES GOALS EXERCISES CRITERIA TO ADVANCE  EXAMINATION  4 (Post-operative week 12+) No heavy lifting and no overhead sports  Weight lifting limit 25.lb  No heavy pushing activity  Gradually increase strength   NO UPPER BODY ERGOMETER  Optimize functional use of operative UE to patient specific goals  Gradual increase in deltoid, scapular muscle and rotator cuff strength  Pain-free functional activities Light hand weights for deltoid up to and not to exceed 3 lbs for anterior and posterior with long arm lift against gravity; elbow bent to 90 deg for abduction in scapular plane  Band progression for extension to hip with scapular depression/retraction  Band progression for serratus anterior punches in supine; avoid wall, incline or prone press-ups for serratus anterior  End-range stretching gently without forceful overpressure in all planes (elevation in scapular plane, ER in scapular plane, functional IR) with stretching done for life as part of daily routine  NO UPPER BODY ERGOMETER   Pain-free AROM for shoulder elevation (expect around 135-150 deg)  Functional strength for all ADLs, work tasks, and hobbies approved by [employer]film set with home maintenance program PROM for elevation, ER(0); ER(90)  AROM for elevation, ER(0) and functional IR  Scapulohumeral rhythm/biomechanics of active movement strategies  Strength testing for deltoid, RTC, scapular muscles  Patient reported outcome measure  Pain

## 2023-11-04 ENCOUNTER — Other Ambulatory Visit: Payer: Self-pay | Admitting: Family Medicine

## 2023-11-04 ENCOUNTER — Ambulatory Visit: Payer: 59 | Attending: Family Medicine

## 2023-11-04 DIAGNOSIS — M25512 Pain in left shoulder: Secondary | ICD-10-CM | POA: Diagnosis not present

## 2023-11-04 DIAGNOSIS — G8929 Other chronic pain: Secondary | ICD-10-CM | POA: Diagnosis not present

## 2023-11-04 DIAGNOSIS — E1169 Type 2 diabetes mellitus with other specified complication: Secondary | ICD-10-CM

## 2023-11-04 DIAGNOSIS — M6281 Muscle weakness (generalized): Secondary | ICD-10-CM | POA: Diagnosis not present

## 2023-11-04 DIAGNOSIS — Z96612 Presence of left artificial shoulder joint: Secondary | ICD-10-CM

## 2023-11-07 ENCOUNTER — Ambulatory Visit: Payer: 59

## 2023-11-07 DIAGNOSIS — M6281 Muscle weakness (generalized): Secondary | ICD-10-CM

## 2023-11-07 DIAGNOSIS — G8929 Other chronic pain: Secondary | ICD-10-CM

## 2023-11-07 DIAGNOSIS — Z96612 Presence of left artificial shoulder joint: Secondary | ICD-10-CM

## 2023-11-07 DIAGNOSIS — M25512 Pain in left shoulder: Secondary | ICD-10-CM | POA: Diagnosis not present

## 2023-11-07 NOTE — Therapy (Signed)
OUTPATIENT PHYSICAL THERAPY TREATMENT NOTE   Patient Name: [name] MRN: [id number] DOB:1951-10-05, 72 y.o., female Today's Date: 11/07/2023  END OF SESSION:  PT End of Session - 11/07/23 0901     Visit Number 3    Number of Visits 16    Date for PT Re-Evaluation 12/19/23    Authorization Type [employer] MCR    PT Start Time 0915    PT Stop Time 0955    PT Time Calculation (min) 40 min    Activity Tolerance Patient limited by pain    Behavior During Therapy [facility] for tasks assessed/performed             Past Medical History:  Diagnosis Date   Anemia    Arthritis    Asthma    Blood transfusion without reported diagnosis    Patients believes 2015 when she overdosed on "medications"    Breast cancer of lower-outer quadrant of right female breast (HCC) 02/13/2015   Cataract    CHF, acute (HCC) 05/03/2012   Depression    Diabetes mellitus    type 2   Eczema    Fatty liver    Fatty infiltration of liver noted on 03/2012 CT scan   Fibromyalgia    Glaucoma    bilateral   Heart disease    History of kidney stones    w/ hx of hydronephrosis - followed by [facility]   HIV nonspecific serology    2006: indeterminate HIV blood test, seen by ID, felt secondary to cross reacting antibodies with no further workup felt necessary at that time    Hyperlipidemia    Hypertension    Obesity    Personal history of radiation therapy 2016   right   Radiation 05/07/15-06/23/15   Right Breast   Past Surgical History:  Procedure Laterality Date   BREAST BIOPSY Left 02/10/2015   malignant    BREAST LUMPECTOMY Right 03/21/2015   CHOLECYSTECTOMY  2003   CYSTOSCOPY W/ LITHOLAPAXY / [facility]     LEFT AND RIGHT HEART CATHETERIZATION WITH CORONARY ANGIOGRAM N/A 05/02/2012   Procedure: LEFT AND RIGHT HEART CATHETERIZATION WITH CORONARY ANGIOGRAM;  Surgeon: Kathleene Hazel, MD;  Location: [facility] CATH LAB;  Service: Cardiovascular;  Laterality: N/A;   RADIOACTIVE SEED GUIDED PARTIAL  MASTECTOMY WITH AXILLARY SENTINEL LYMPH NODE BIOPSY Right 03/21/2015   Procedure: RIGHT  PARTIAL MASTECTOMY WITH RADIOACTIVE SEED LOCALIZATION RIGHT  AXILLARY SENTINEL LYMPH NODE BIOPSY;  Surgeon: Claud Kelp, MD;  Location: [facility];  Service: General;  Laterality: Right;   REPLACEMENT TOTAL KNEE BILATERAL  2005 &2006   REVERSE SHOULDER ARTHROPLASTY Left 09/27/2023   Procedure: LEFT REVERSE SHOULDER ARTHROPLASTY;  Surgeon: Huel Cote, MD;  Location: [facility] OR;  Service: Orthopedics;  Laterality: Left;   VASCULAR SURGERY Right 03/15/2013   Ultrasound guided sclerotherapy   Patient Active Problem List   Diagnosis Date Noted   Primary osteoarthritis, left shoulder 09/27/2023   Renal disease due to diabetes mellitus (HCC) 04/28/2023   Left shoulder pain 09/06/2022   Left breast mass 11/11/2021   Tinea pedis 04/09/2020   Spinal stenosis of lumbar region without neurogenic claudication 02/08/2019   Gout 05/16/2018   Gait instability 01/03/2018   Tremor of both hands    Stuttering    Essential hypertension    Hyperlipidemia 08/13/2015   Breast cancer of lower-outer quadrant of right female breast (HCC) 02/13/2015   Type 2 diabetes mellitus with diabetic foot deformity (HCC) 01/01/2014   Chronic combined systolic  and diastolic heart failure (HCC) 02/26/2013   OBESITY 02/05/2009   Asthma, intermittent 02/08/2008   Neuropathy due to secondary diabetes (HCC) 05/18/2007   NEPHROLITHIASIS 01/26/2007   DJD (degenerative joint disease), multiple sites 01/26/2007    PCP: Carney Living, MD   REFERRING PROVIDER: Huel Cote, MD  REFERRING DIAG: [phone] (ICD-10-CM) - Primary osteoarthritis, left shoulder  THERAPY DIAG:  Chronic left shoulder pain  Muscle weakness (generalized)  Status post reverse total replacement of left shoulder  Rationale for Evaluation and Treatment: Rehabilitation  ONSET DATE: 09/22/23  SUBJECTIVE:                                                                                                                                                                                       SUBJECTIVE STATEMENT:  Patient reports 6/10 pain today. Sees Dr. Steward Drone on Friday.  Hand dominance: Right  PERTINENT HISTORY: 2-week status post reverse shoulder arthroplasty without complication. This time I would like her to work particular with outpatient physical therapy to work on regaining her forward flexion and abduction as this is quite limited. She will continue to work on this. I will plan to see him back in 4 weeks for reassessment  PAIN:  Are you having pain? Yes: NPRS scale: 9/10 Pain location: L shoulder Pain description: ache Aggravating factors: activity and weightbearing posiitons Relieving factors: rest and meds    PRECAUTIONS: Shoulder  RED FLAGS: None   WEIGHT BEARING RESTRICTIONS: No  FALLS:  Has patient fallen in last 6 months? No  OCCUPATION: retired  PLOF: Independent with basic ADLs  PATIENT GOALS:To move my shoulder again  NEXT MD VISIT:   OBJECTIVE:  Note: Objective measures were completed at Evaluation unless otherwise noted.  DIAGNOSTIC FINDINGS:  none  PATIENT SURVEYS:  FOTO 38(59 predicted)  POSTURE: Forward and rounded shoulders   UPPER EXTREMITY ROM:   PROM Right eval Left eval  Shoulder flexion  60d  Shoulder extension  30d  Shoulder scaption   60d  Shoulder adduction    Shoulder internal rotation    Shoulder external rotation    Elbow flexion  WFL  Elbow extension  WFL  Wrist flexion    Wrist extension    Wrist ulnar deviation    Wrist radial deviation    Wrist pronation    Wrist supination    (Blank rows = not tested)  UPPER EXTREMITY MMT: Deferred post-op  MMT Right eval Left eval  Shoulder flexion    Shoulder extension    Shoulder abduction    Shoulder adduction    Shoulder internal rotation    Shoulder external rotation    Middle trapezius  Lower  trapezius    Elbow flexion    Elbow extension    Wrist flexion    Wrist extension    Wrist ulnar deviation    Wrist radial deviation    Wrist pronation    Wrist supination    Grip strength (lbs)    (Blank rows = not tested)  SHOULDER SPECIAL TESTS: Deferred post-op  JOINT MOBILITY TESTING:  Dferred post op  PALPATION:  deferred   TODAY'S TREATMENT:   [facility] Adult PT Treatment:                                                DATE: 11/07/23 Therapeutic Exercise: Wall ladder 10x - verbal and tactile cues to decrease compensation Supine dowel AAROM flexion 2x5 Supine AAROM chest press with contralateral UE 2x10 Manual Therapy: PROM 15d ER supine in scaption PROM FF 100d in elevated position Isometrics 3s hold flex/abd/ext 10 reps             [facility] Adult PT Treatment:                                                DATE: 11/04/23 Therapeutic Exercise: Wall ladder 10x UE ranger seated scaption 10x2 Supine AAROM chest press 10x2 Manual Therapy: PROM 15d ER supine in scaption PROM FF 100d in elevated position Isometrics 3s hold flex/abd/ext 10 reps                                                                                                                                  DATE: 10/19/23 Eval and HEP   PATIENT EDUCATION: Education details: Discussed eval findings, rehab rationale and POC and patient is in agreement  Person educated: Patient Education method: Explanation Education comprehension: verbalized understanding and needs further education  HOME EXERCISE PROGRAM: Access Code: [facility] URL: [email or web address] Date: 10/19/2023 Prepared by: Gustavus Bryant  Exercises - Circular Shoulder Pendulum with Table Support  - 5 x daily - 7 x weekly - 10 reps - Supine Shoulder Flexion AAROM with Hands Clasped  - 5 x daily - 7 x weekly - 10 reps - Seated Shoulder Shrugs  - 5 x daily - 7 x weekly - 10 reps - Seated Scapular Retraction  - 5 x daily - 7 x weekly  - 10 reps  ASSESSMENT:  CLINICAL IMPRESSION:  Patient presents to PT reporting continued shoulder pain and difficulty with ADLs. Session today continued to focus on submax isometric of deltoid and PROM and AAROM as able. She has difficulty lifting LUE with dowel into flexion. Patient continues to benefit from skilled PT services and should be progressed as able to improve functional independence.  Patient is a 72 y.o. female who was seen today for physical therapy evaluation and treatment for L shoulder weakness, pain and loss of motion following RTSA. Patient arrived for session holding purse in L hand and advised to avoid in future.  Patient demonstrates decreased L shoulder A/PROM along with strength deficits following surgical procedure.  Patient is good candidate for OPPT.  OBJECTIVE IMPAIRMENTS: decreased activity tolerance, decreased knowledge of condition, decreased mobility, decreased ROM, decreased strength, impaired perceived functional ability, impaired UE functional use, and pain.   ACTIVITY LIMITATIONS: carrying, lifting, sleeping, bed mobility, and dressing  PERSONAL FACTORS: Age, Fitness, Past/current experiences, Time since onset of injury/illness/exacerbation, and 1-2 comorbidities: Asthma CHF  are also affecting patient's functional outcome.   REHAB POTENTIAL: Good  CLINICAL DECISION MAKING: Stable/uncomplicated  EVALUATION COMPLEXITY: Low   GOALS: Goals reviewed with patient? No  SHORT TERM GOALS: Target date: 11/16/2023    Patient to demonstrate independence in HEP  Baseline:  Goal status: [id number]  2.  Increase PROM to 90d flexion and scaption Baseline:  PROM Right eval Left eval  Shoulder flexion  60d  Shoulder extension  30d  Shoulder scaption   60d   Goal status: INITIAL  3.  6/10 worst pain level Baseline: 9/10 Goal status: INITIAL   LONG TERM GOALS: Target date: 12/14/2023    Patient to demonstrate independence in HEP  Baseline:  [id number] Goal status: INITIAL  2.  Patient will score at least 59% on FOTO to signify clinically meaningful improvement in functional abilities.   Baseline: 38% Goal status: INITIAL  3.  Patient will acknowledge 4/10 pain at least once during episode of care   Baseline: 9/10 Goal status: INITIAL  4.  Patient will be able to reach [facility] to allow more participation in ADLs Baseline:  Goal status: INITIAL   PLAN:  PT FREQUENCY: 2x/week  PT DURATION: 6 weeks  PLANNED INTERVENTIONS: 97164- PT Re-evaluation, 97110-Therapeutic exercises, 97530- Therapeutic activity, 97112- Neuromuscular re-education, 97535- Self Care, 16109- Manual therapy, Patient/Family education, and DME instructions  PLAN FOR NEXT SESSION: HEP review and update, manual techniques as appropriate, aerobic tasks, ROM and flexibility activities, strengthening and PREs, TPDN, gait and balance training as needed    Date of referral: 08/17/23 Referring provider: Huel Cote, MD Referring diagnosis? M19.012 (ICD-10-CM) - Primary osteoarthritis, left shoulder Treatment diagnosis? (if different than referring diagnosis) M19.012 (ICD-10-CM) - Primary osteoarthritis, left shoulder  What was this (referring dx) caused by? Arthritis  Nature of Condition: Chronic (continuous duration > 3 months)   Laterality: Lt  Current Functional Measure Score: FOTO 38%  Objective measurements identify impairments when they are compared to normal values, the uninvolved extremity, and prior level of function.  [x]  Yes  []  No  Objective assessment of functional ability: Severe functional limitations   Briefly describe symptoms: L shoulder pain and soreness restricting ROM and function  How did symptoms start: degenerative condition  Average pain intensity:  Last 24 hours: 9  Past week: 9  How often does the pt experience symptoms? Frequently  How much have the symptoms interfered with usual daily activities? Extremely  How has  condition changed since care began at this facility? NA - initial visit  In general, how is the patients overall health? Fair   BACK PAIN (STarT Back Screening Tool) No   Berta Minor, PTA 11/07/2023, 9:54 AM   REVERSE SHOULDER ARTHROPLASTY REHABILITATION GUIDELINES These guidelines do NOT apply to RTSA for proximal humeral fracture - rehabilitation following PHFx will commence 4-6  weeks after surgery as deemed appropriate to protect tuberosity healing PHASE PRECAUTIONS AND GUIDELINES GOALS EXERCISES CRITERIA TO ADVANCE  EXAMINATION  1 (Post-operative day 1 to post-operative week 3) Sling 24/7 (remove for grooming and home exercise program 3-5x/day)  Avoid combined IR/EXT/ADD (hand behind the back) and IR/ADD (reaching across chest) for dislocation precautions  Pillow behind the upper arm while reclining with sling on   Patient should always be able to see the elbow  Avoid WBing - discuss WBing need with physician and PT  No submersion in water until after 4 weeks  Ice after HEP as needed  Maintain integrity of joint replacement; protect soft tissue healing  ROM for elevation to 130 and ER to 30   Optimize distal UE circulation and muscle activity (elbow, wrist and hand)  Instruct in use of sling for proper fit  Educate regarding signs/symptoms of infection    Active elbow, wrist and hand  Pendulum  Scapular retraction with arms resting in neutral position  Forward elevation in scapular plane to 130 deg max motion (table slides, step backs, supine well arm assisted)  ER in scapular plane to 30 deg (seated or supine)  ROM within precautionary range limits may be active or passive   Pain less than 3/10 with ROM  Healing incision without signs of infection  Clearance by surgeon to advance after 2 week post-operative visit Wound assessment  Swelling assessment of upper extremity  Neurovascular assessment of upper extremity  Sling fit and ability to  don/doff properly  Patient reported outcome measure  Pain level  Range of motion for elevation and ER   PHASE PRECAUTIONS AND GUIDELINES GOALS EXERCISES CRITERIA TO ADVANCE  EXAMINATION  2 (Post-operative week 3 to 6) May discontinue sling use at 3 weeks; after 2 weeks can remove the sling at home and just use the sling at night and in community for 3rd week  May use arm for basic activities of daily living (such as feeding, brushing teeth, dressing.)   May submerge in water after 4 weeks  Avoid combined IR/EXT/ADD (hand behind the back) and IR/ADD (reaching across chest) for dislocation precautions  Avoid acromial or scapular spine pain as increase deltoid loading - decrease load if this occurs  Elevation to 130 deg and ER to 30 deg - passive, active assisted or active  Low (less than 3/10) to no pain  Ability to fire all heads of the deltoid May discontinue grip, and active elbow and wrist exercises since using the arm in ADLs with sling removed around the home  Continue elevation to 130 and ER to 30, both in scapular plane   Submaximal isometrics (pain-free effort) for all functional heads of deltoid (anterior, posterior, middle).   Active exercise as able:  Supine forward punch  Place in balanced position with circumduction and progressive arcs in sagittal plane  Side-lying abduction to 90   Lateral raise with bent elbow   Prone extension to hip  Elevation in scapular plane to 130; ER in scapular plane to 30   Ability to fire isometrically all heads of the deltoid muscle without pain  Ability to place and hold the arm in balanced position (90 deg elevation in supine) Wound assessment  Neurovascular assessment  Swelling assessment  ROM shoulder elevation and ER(0)  Patient reported outcome measure  Pain level   PHASE PRECAUTIONS AND GUIDELINES GOALS EXERCISES CRITERIA TO ADVANCE  EXAMINATION  3 (Post-operative week 6 to 12) Avoid forceful end-range motion in  any direction  Progress active use of the arm in ADLs without being restricted to arm by the side of the body;   No heavy lifting or carrying  Initiate functional IR behind the back gently without forceful overpressure  Avoid acromial or scapular spine pain as increase deltoid loading - decrease load if this occurs  NO UPPER BODY ERGOMETER  Optimize ROM for elevation and ER in scapular plane   Expected PROM: Elevation - 145-160; ER - 40-50 ; functional IR to L1  Recover AROM to approach as close to PROM available as possible  Establish dynamic stability of the shoulder  Forward elevation in scapular plane active progression: supine to incline to vertical; short to long lever arm  Lateral raise with bent elbow; side-lying abduction  Active ER/IR with arm at side  Scapular retraction with light band resistance  Serratus anterior punches in supine; avoid wall, incline or prone press-ups for serratus anterior  Functional IR with hand slide up back - very gentle and gradual   AROM equals/approaches PROM with good mechanics for elevation  No pain  Higher level demand on shoulder than ADL functions PROM for elevation, ER(0)  AROM for elevation, ER(0) and functional IR  Patient reported outcome measure  Pain             PHASE PRECAUTIONS AND GUIDELINES GOALS EXERCISES CRITERIA TO ADVANCE  EXAMINATION  4 (Post-operative week 12+) No heavy lifting and no overhead sports  Weight lifting limit 25.lb  No heavy pushing activity  Gradually increase strength   NO UPPER BODY ERGOMETER  Optimize functional use of operative UE to patient specific goals  Gradual increase in deltoid, scapular muscle and rotator cuff strength  Pain-free functional activities Light hand weights for deltoid up to and not to exceed 3 lbs for anterior and posterior with long arm lift against gravity; elbow bent to 90 deg for abduction in scapular plane  Band progression for extension to hip with  scapular depression/retraction  Band progression for serratus anterior punches in supine; avoid wall, incline or prone press-ups for serratus anterior  End-range stretching gently without forceful overpressure in all planes (elevation in scapular plane, ER in scapular plane, functional IR) with stretching done for life as part of daily routine  NO UPPER BODY ERGOMETER  Pain-free AROM for shoulder elevation (expect around 135-150 deg)  Functional strength for all ADLs, work tasks, and hobbies approved by [employer]/film set with home maintenance program PROM for elevation, ER(0); ER(90)  AROM for elevation, ER(0) and functional IR  Scapulohumeral rhythm/biomechanics of active movement strategies  Strength testing for deltoid, RTC, scapular muscles  Patient reported outcome measure  Pain

## 2023-11-09 ENCOUNTER — Ambulatory Visit: Payer: 59 | Admitting: Podiatry

## 2023-11-10 ENCOUNTER — Ambulatory Visit: Payer: 59

## 2023-11-10 DIAGNOSIS — Z96612 Presence of left artificial shoulder joint: Secondary | ICD-10-CM

## 2023-11-10 DIAGNOSIS — G8929 Other chronic pain: Secondary | ICD-10-CM | POA: Diagnosis not present

## 2023-11-10 DIAGNOSIS — M6281 Muscle weakness (generalized): Secondary | ICD-10-CM

## 2023-11-10 DIAGNOSIS — M25512 Pain in left shoulder: Secondary | ICD-10-CM | POA: Diagnosis not present

## 2023-11-10 NOTE — Therapy (Signed)
OUTPATIENT PHYSICAL THERAPY TREATMENT NOTE   Patient Name: [name] MRN: [id number] DOB:November 05, 1951, 72 y.o., female Today's Date: 11/10/2023  END OF SESSION:  PT End of Session - 11/10/23 1513     Visit Number 4    Number of Visits 16    Date for PT Re-Evaluation 12/19/23    Authorization Type [employer] MCR    PT Start Time 1530    PT Stop Time 1610    PT Time Calculation (min) 40 min    Activity Tolerance Patient limited by pain    Behavior During Therapy [facility] for tasks assessed/performed              Past Medical History:  Diagnosis Date   Anemia    Arthritis    Asthma    Blood transfusion without reported diagnosis    Patients believes 2015 when she overdosed on "medications"    Breast cancer of lower-outer quadrant of right female breast (HCC) 02/13/2015   Cataract    CHF, acute (HCC) 05/03/2012   Depression    Diabetes mellitus    type 2   Eczema    Fatty liver    Fatty infiltration of liver noted on 03/2012 CT scan   Fibromyalgia    Glaucoma    bilateral   Heart disease    History of kidney stones    w/ hx of hydronephrosis - followed by [facility]   HIV nonspecific serology    2006: indeterminate HIV blood test, seen by ID, felt secondary to cross reacting antibodies with no further workup felt necessary at that time    Hyperlipidemia    Hypertension    Obesity    Personal history of radiation therapy 2016   right   Radiation 05/07/15-06/23/15   Right Breast   Past Surgical History:  Procedure Laterality Date   BREAST BIOPSY Left 02/10/2015   malignant    BREAST LUMPECTOMY Right 03/21/2015   CHOLECYSTECTOMY  2003   CYSTOSCOPY W/ LITHOLAPAXY / [facility]     LEFT AND RIGHT HEART CATHETERIZATION WITH CORONARY ANGIOGRAM N/A 05/02/2012   Procedure: LEFT AND RIGHT HEART CATHETERIZATION WITH CORONARY ANGIOGRAM;  Surgeon: Kathleene Hazel, MD;  Location: [facility] CATH LAB;  Service: Cardiovascular;  Laterality: N/A;   RADIOACTIVE SEED GUIDED PARTIAL  MASTECTOMY WITH AXILLARY SENTINEL LYMPH NODE BIOPSY Right 03/21/2015   Procedure: RIGHT  PARTIAL MASTECTOMY WITH RADIOACTIVE SEED LOCALIZATION RIGHT  AXILLARY SENTINEL LYMPH NODE BIOPSY;  Surgeon: Claud Kelp, MD;  Location: [facility];  Service: General;  Laterality: Right;   REPLACEMENT TOTAL KNEE BILATERAL  2005 &2006   REVERSE SHOULDER ARTHROPLASTY Left 09/27/2023   Procedure: LEFT REVERSE SHOULDER ARTHROPLASTY;  Surgeon: Huel Cote, MD;  Location: [facility] OR;  Service: Orthopedics;  Laterality: Left;   VASCULAR SURGERY Right 03/15/2013   Ultrasound guided sclerotherapy   Patient Active Problem List   Diagnosis Date Noted   Primary osteoarthritis, left shoulder 09/27/2023   Renal disease due to diabetes mellitus (HCC) 04/28/2023   Left shoulder pain 09/06/2022   Left breast mass 11/11/2021   Tinea pedis 04/09/2020   Spinal stenosis of lumbar region without neurogenic claudication 02/08/2019   Gout 05/16/2018   Gait instability 01/03/2018   Tremor of both hands    Stuttering    Essential hypertension    Hyperlipidemia 08/13/2015   Breast cancer of lower-outer quadrant of right female breast (HCC) 02/13/2015   Type 2 diabetes mellitus with diabetic foot deformity (HCC) 01/01/2014   Chronic combined  systolic and diastolic heart failure (HCC) 02/26/2013   OBESITY 02/05/2009   Asthma, intermittent 02/08/2008   Neuropathy due to secondary diabetes (HCC) 05/18/2007   NEPHROLITHIASIS 01/26/2007   DJD (degenerative joint disease), multiple sites 01/26/2007    PCP: Carney Living, MD   REFERRING PROVIDER: Huel Cote, MD  REFERRING DIAG: [phone] (ICD-10-CM) - Primary osteoarthritis, left shoulder  THERAPY DIAG:  Chronic left shoulder pain  Muscle weakness (generalized)  Status post reverse total replacement of left shoulder  Rationale for Evaluation and Treatment: Rehabilitation  ONSET DATE: 09/22/23  SUBJECTIVE:                                                                                                                                                                                       SUBJECTIVE STATEMENT:  Patient reports 7/10 pain in her Lt shoulder, sees Dr. Steward Drone tomorrow.  Hand dominance: Right  PERTINENT HISTORY: 2-week status post reverse shoulder arthroplasty without complication. This time I would like her to work particular with outpatient physical therapy to work on regaining her forward flexion and abduction as this is quite limited. She will continue to work on this. I will plan to see him back in 4 weeks for reassessment  PAIN:  Are you having pain? Yes: NPRS scale: 9/10 Pain location: L shoulder Pain description: ache Aggravating factors: activity and weightbearing posiitons Relieving factors: rest and meds    PRECAUTIONS: Shoulder  RED FLAGS: None   WEIGHT BEARING RESTRICTIONS: No  FALLS:  Has patient fallen in last 6 months? No  OCCUPATION: retired  PLOF: Independent with basic ADLs  PATIENT GOALS:To move my shoulder again  NEXT MD VISIT:   OBJECTIVE:  Note: Objective measures were completed at Evaluation unless otherwise noted.  DIAGNOSTIC FINDINGS:  none  PATIENT SURVEYS:  FOTO 38(59 predicted)  POSTURE: Forward and rounded shoulders   UPPER EXTREMITY ROM:   PROM Right eval Left eval  Shoulder flexion  60d  Shoulder extension  30d  Shoulder scaption   60d  Shoulder adduction    Shoulder internal rotation    Shoulder external rotation    Elbow flexion  WFL  Elbow extension  WFL  Wrist flexion    Wrist extension    Wrist ulnar deviation    Wrist radial deviation    Wrist pronation    Wrist supination    (Blank rows = not tested)  UPPER EXTREMITY MMT: Deferred post-op  MMT Right eval Left eval  Shoulder flexion    Shoulder extension    Shoulder abduction    Shoulder adduction    Shoulder internal rotation    Shoulder external rotation  Middle trapezius     Lower trapezius    Elbow flexion    Elbow extension    Wrist flexion    Wrist extension    Wrist ulnar deviation    Wrist radial deviation    Wrist pronation    Wrist supination    Grip strength (lbs)    (Blank rows = not tested)  SHOULDER SPECIAL TESTS: Deferred post-op  JOINT MOBILITY TESTING:  Dferred post op  PALPATION:  deferred   TODAY'S TREATMENT:   [facility] Adult PT Treatment:                                                DATE: 11/10/23 Therapeutic Exercise: Wall ladder 10x - verbal and tactile cues to decrease compensation Supine dowel AAROM flexion 2x5 Supine AAROM chest press with contralateral UE 2x10 Sidelying abduction LUE 2x10 Manual Therapy: PROM 15d ER supine in scaption PROM FF 100d in elevated position Isometrics 3s hold flex/abd/ext 10 reps     [facility] Adult PT Treatment:                                                DATE: 11/07/23 Therapeutic Exercise: Wall ladder 10x - verbal and tactile cues to decrease compensation Supine dowel AAROM flexion 2x5 Supine AAROM chest press with contralateral UE 2x10 Manual Therapy: PROM 15d ER supine in scaption PROM FF 100d in elevated position Isometrics 3s hold flex/abd/ext 10 reps             [facility] Adult PT Treatment:                                                DATE: 11/04/23 Therapeutic Exercise: Wall ladder 10x UE ranger seated scaption 10x2 Supine AAROM chest press 10x2 Manual Therapy: PROM 15d ER supine in scaption PROM FF 100d in elevated position Isometrics 3s hold flex/abd/ext 10 reps                                                      PATIENT EDUCATION: Education details: Discussed eval findings, rehab rationale and POC and patient is in agreement  Person educated: Patient Education method: Explanation Education comprehension: verbalized understanding and needs further education  HOME EXERCISE PROGRAM: Access Code: [facility] URL: [email or web address] Date:  10/19/2023 Prepared by: Gustavus Bryant  Exercises - Circular Shoulder Pendulum with Table Support  - 5 x daily - 7 x weekly - 10 reps - Supine Shoulder Flexion AAROM with Hands Clasped  - 5 x daily - 7 x weekly - 10 reps - Seated Shoulder Shrugs  - 5 x daily - 7 x weekly - 10 reps - Seated Scapular Retraction  - 5 x daily - 7 x weekly - 10 reps  ASSESSMENT:  CLINICAL IMPRESSION:  Patient presents to PT reporting continued pain in her Lt shoulder and that sometimes she has trouble remembering her precautions, particularly reaching behind the back. Session today  continued to focus on PROM, AAROM and isometric strengthening for deltoid. She continues to be limited by pain and weakness particularly into flexion. Patient continues to benefit from skilled PT services and should be progressed as able to improve functional independence.   Patient is a 72 y.o. female who was seen today for physical therapy evaluation and treatment for L shoulder weakness, pain and loss of motion following RTSA. Patient arrived for session holding purse in L hand and advised to avoid in future.  Patient demonstrates decreased L shoulder A/PROM along with strength deficits following surgical procedure.  Patient is good candidate for OPPT.  OBJECTIVE IMPAIRMENTS: decreased activity tolerance, decreased knowledge of condition, decreased mobility, decreased ROM, decreased strength, impaired perceived functional ability, impaired UE functional use, and pain.   ACTIVITY LIMITATIONS: carrying, lifting, sleeping, bed mobility, and dressing  PERSONAL FACTORS: Age, Fitness, Past/current experiences, Time since onset of injury/illness/exacerbation, and 1-2 comorbidities: Asthma CHF  are also affecting patient's functional outcome.   REHAB POTENTIAL: Good  CLINICAL DECISION MAKING: Stable/uncomplicated  EVALUATION COMPLEXITY: Low   GOALS: Goals reviewed with patient? No  SHORT TERM GOALS: Target date: 11/16/2023     Patient to demonstrate independence in HEP  Baseline:  Goal status: [id number]  2.  Increase PROM to 90d flexion and scaption Baseline:  PROM Right eval Left eval  Shoulder flexion  60d  Shoulder extension  30d  Shoulder scaption   60d   Goal status: INITIAL  3.  6/10 worst pain level Baseline: 9/10 Goal status: INITIAL   LONG TERM GOALS: Target date: 12/14/2023    Patient to demonstrate independence in HEP  Baseline: [id number] Goal status: INITIAL  2.  Patient will score at least 59% on FOTO to signify clinically meaningful improvement in functional abilities.   Baseline: 38% Goal status: INITIAL  3.  Patient will acknowledge 4/10 pain at least once during episode of care   Baseline: 9/10 Goal status: INITIAL  4.  Patient will be able to reach [facility] to allow more participation in ADLs Baseline:  Goal status: INITIAL   PLAN:  PT FREQUENCY: 2x/week  PT DURATION: 6 weeks  PLANNED INTERVENTIONS: 97164- PT Re-evaluation, 97110-Therapeutic exercises, 97530- Therapeutic activity, 97112- Neuromuscular re-education, 97535- Self Care, 16109- Manual therapy, Patient/Family education, and DME instructions  PLAN FOR NEXT SESSION: HEP review and update, manual techniques as appropriate, aerobic tasks, ROM and flexibility activities, strengthening and PREs, TPDN, gait and balance training as needed    Date of referral: 08/17/23 Referring provider: Huel Cote, MD Referring diagnosis? M19.012 (ICD-10-CM) - Primary osteoarthritis, left shoulder Treatment diagnosis? (if different than referring diagnosis) M19.012 (ICD-10-CM) - Primary osteoarthritis, left shoulder  What was this (referring dx) caused by? Arthritis  Nature of Condition: Chronic (continuous duration > 3 months)   Laterality: Lt  Current Functional Measure Score: FOTO 38%  Objective measurements identify impairments when they are compared to normal values, the uninvolved extremity, and prior level of  function.  [x]  Yes  []  No  Objective assessment of functional ability: Severe functional limitations   Briefly describe symptoms: L shoulder pain and soreness restricting ROM and function  How did symptoms start: degenerative condition  Average pain intensity:  Last 24 hours: 9  Past week: 9  How often does the pt experience symptoms? Frequently  How much have the symptoms interfered with usual daily activities? Extremely  How has condition changed since care began at this facility? NA - initial visit  In general, how is the patients overall health?  Fair   BACK PAIN (STarT Back Screening Tool) No   Berta Minor, PTA 11/10/2023, 4:13 PM   REVERSE SHOULDER ARTHROPLASTY REHABILITATION GUIDELINES These guidelines do NOT apply to RTSA for proximal humeral fracture - rehabilitation following PHFx will commence 4-6 weeks after surgery as deemed appropriate to protect tuberosity healing PHASE PRECAUTIONS AND GUIDELINES GOALS EXERCISES CRITERIA TO ADVANCE  EXAMINATION  1 (Post-operative day 1 to post-operative week 3) Sling 24/7 (remove for grooming and home exercise program 3-5x/day)  Avoid combined IR/EXT/ADD (hand behind the back) and IR/ADD (reaching across chest) for dislocation precautions  Pillow behind the upper arm while reclining with sling on   Patient should always be able to see the elbow  Avoid WBing - discuss WBing need with physician and PT  No submersion in water until after 4 weeks  Ice after HEP as needed  Maintain integrity of joint replacement; protect soft tissue healing  ROM for elevation to 130 and ER to 30   Optimize distal UE circulation and muscle activity (elbow, wrist and hand)  Instruct in use of sling for proper fit  Educate regarding signs/symptoms of infection    Active elbow, wrist and hand  Pendulum  Scapular retraction with arms resting in neutral position  Forward elevation in scapular plane to 130 deg max motion (table  slides, step backs, supine well arm assisted)  ER in scapular plane to 30 deg (seated or supine)  ROM within precautionary range limits may be active or passive   Pain less than 3/10 with ROM  Healing incision without signs of infection  Clearance by surgeon to advance after 2 week post-operative visit Wound assessment  Swelling assessment of upper extremity  Neurovascular assessment of upper extremity  Sling fit and ability to don/doff properly  Patient reported outcome measure  Pain level  Range of motion for elevation and ER   PHASE PRECAUTIONS AND GUIDELINES GOALS EXERCISES CRITERIA TO ADVANCE  EXAMINATION  2 (Post-operative week 3 to 6) May discontinue sling use at 3 weeks; after 2 weeks can remove the sling at home and just use the sling at night and in community for 3rd week  May use arm for basic activities of daily living (such as feeding, brushing teeth, dressing.)   May submerge in water after 4 weeks  Avoid combined IR/EXT/ADD (hand behind the back) and IR/ADD (reaching across chest) for dislocation precautions  Avoid acromial or scapular spine pain as increase deltoid loading - decrease load if this occurs  Elevation to 130 deg and ER to 30 deg - passive, active assisted or active  Low (less than 3/10) to no pain  Ability to fire all heads of the deltoid May discontinue grip, and active elbow and wrist exercises since using the arm in ADLs with sling removed around the home  Continue elevation to 130 and ER to 30, both in scapular plane   Submaximal isometrics (pain-free effort) for all functional heads of deltoid (anterior, posterior, middle).   Active exercise as able:  Supine forward punch  Place in balanced position with circumduction and progressive arcs in sagittal plane  Side-lying abduction to 90   Lateral raise with bent elbow   Prone extension to hip  Elevation in scapular plane to 130; ER in scapular plane to 30   Ability to fire  isometrically all heads of the deltoid muscle without pain  Ability to place and hold the arm in balanced position (90 deg elevation in supine) Wound assessment  Neurovascular assessment  Swelling assessment  ROM shoulder elevation and ER(0)  Patient reported outcome measure  Pain level   PHASE PRECAUTIONS AND GUIDELINES GOALS EXERCISES CRITERIA TO ADVANCE  EXAMINATION  3 (Post-operative week 6 to 12) Avoid forceful end-range motion in any direction   Progress active use of the arm in ADLs without being restricted to arm by the side of the body;   No heavy lifting or carrying  Initiate functional IR behind the back gently without forceful overpressure  Avoid acromial or scapular spine pain as increase deltoid loading - decrease load if this occurs  NO UPPER BODY ERGOMETER  Optimize ROM for elevation and ER in scapular plane   Expected PROM: Elevation - 145-160; ER - 40-50 ; functional IR to L1  Recover AROM to approach as close to PROM available as possible  Establish dynamic stability of the shoulder  Forward elevation in scapular plane active progression: supine to incline to vertical; short to long lever arm  Lateral raise with bent elbow; side-lying abduction  Active ER/IR with arm at side  Scapular retraction with light band resistance  Serratus anterior punches in supine; avoid wall, incline or prone press-ups for serratus anterior  Functional IR with hand slide up back - very gentle and gradual   AROM equals/approaches PROM with good mechanics for elevation  No pain  Higher level demand on shoulder than ADL functions PROM for elevation, ER(0)  AROM for elevation, ER(0) and functional IR  Patient reported outcome measure  Pain             PHASE PRECAUTIONS AND GUIDELINES GOALS EXERCISES CRITERIA TO ADVANCE  EXAMINATION  4 (Post-operative week 12+) No heavy lifting and no overhead sports  Weight lifting limit 25.lb  No heavy pushing  activity  Gradually increase strength   NO UPPER BODY ERGOMETER  Optimize functional use of operative UE to patient specific goals  Gradual increase in deltoid, scapular muscle and rotator cuff strength  Pain-free functional activities Light hand weights for deltoid up to and not to exceed 3 lbs for anterior and posterior with long arm lift against gravity; elbow bent to 90 deg for abduction in scapular plane  Band progression for extension to hip with scapular depression/retraction  Band progression for serratus anterior punches in supine; avoid wall, incline or prone press-ups for serratus anterior  End-range stretching gently without forceful overpressure in all planes (elevation in scapular plane, ER in scapular plane, functional IR) with stretching done for life as part of daily routine  NO UPPER BODY ERGOMETER  Pain-free AROM for shoulder elevation (expect around 135-150 deg)  Functional strength for all ADLs, work tasks, and hobbies approved by [employer]film [employer] with home maintenance program PROM for elevation, ER(0); ER(90)  AROM for elevation, ER(0) and functional IR  Scapulohumeral rhythm/biomechanics of active movement strategies  Strength testing for deltoid, RTC, scapular muscles  Patient reported outcome measure  Pain

## 2023-11-11 ENCOUNTER — Encounter (HOSPITAL_BASED_OUTPATIENT_CLINIC_OR_DEPARTMENT_OTHER): Payer: Self-pay | Admitting: Orthopaedic Surgery

## 2023-11-11 ENCOUNTER — Ambulatory Visit (INDEPENDENT_AMBULATORY_CARE_PROVIDER_SITE_OTHER): Payer: 59 | Admitting: Orthopaedic Surgery

## 2023-11-11 DIAGNOSIS — M19012 Primary osteoarthritis, left shoulder: Secondary | ICD-10-CM

## 2023-11-11 NOTE — Progress Notes (Signed)
Post Operative Evaluation    Procedure/Date of Surgery: Left reverse shoulder arthroplasty 10/29  Interval History:    Presents today 6 weeks status post left reverse shoulder arthroplasty.  Overall she is working to improve her range of motion which is slowly improving.  Her pain is much improved at today's visit.  She is continue to work through physical therapy.   PMH/PSH/Family History/Social History/Meds/Allergies:    Past Medical History:  Diagnosis Date   Anemia    Arthritis    Asthma    Blood transfusion without reported diagnosis    Patients believes 2015 when she overdosed on "medications"    Breast cancer of lower-outer quadrant of right female breast (HCC) 02/13/2015   Cataract    CHF, acute (HCC) 05/03/2012   Depression    Diabetes mellitus    type 2   Eczema    Fatty liver    Fatty infiltration of liver noted on 03/2012 CT scan   Fibromyalgia    Glaucoma    bilateral   Heart disease    History of kidney stones    w/ hx of hydronephrosis - followed by [facility]   HIV nonspecific serology    2006: indeterminate HIV blood test, seen by ID, felt secondary to cross reacting antibodies with no further workup felt necessary at that time    Hyperlipidemia    Hypertension    Obesity    Personal history of radiation therapy 2016   right   Radiation 05/07/15-06/23/15   Right Breast   Past Surgical History:  Procedure Laterality Date   BREAST BIOPSY Left 02/10/2015   malignant    BREAST LUMPECTOMY Right 03/21/2015   CHOLECYSTECTOMY  2003   CYSTOSCOPY W/ LITHOLAPAXY / [facility]     LEFT AND RIGHT HEART CATHETERIZATION WITH CORONARY ANGIOGRAM N/A 05/02/2012   Procedure: LEFT AND RIGHT HEART CATHETERIZATION WITH CORONARY ANGIOGRAM;  Surgeon: Kathleene Hazel, MD;  Location: [facility] CATH LAB;  Service: Cardiovascular;  Laterality: N/A;   RADIOACTIVE SEED GUIDED PARTIAL MASTECTOMY WITH AXILLARY SENTINEL LYMPH NODE BIOPSY Right  03/21/2015   Procedure: RIGHT  PARTIAL MASTECTOMY WITH RADIOACTIVE SEED LOCALIZATION RIGHT  AXILLARY SENTINEL LYMPH NODE BIOPSY;  Surgeon: Claud Kelp, MD;  Location:  SURGERY CENTER;  Service: General;  Laterality: Right;   REPLACEMENT TOTAL KNEE BILATERAL  2005 &2006   REVERSE SHOULDER ARTHROPLASTY Left 09/27/2023   Procedure: LEFT REVERSE SHOULDER ARTHROPLASTY;  Surgeon: Huel Cote, MD;  Location: [facility] OR;  Service: Orthopedics;  Laterality: Left;   VASCULAR SURGERY Right 03/15/2013   Ultrasound guided sclerotherapy   Social History   Socioeconomic History   Marital status: Widowed    Spouse name: [name]   Number of children: 3   Years of education: 14   Highest education level: Associate degree: occupational, Scientist, product/process development, or vocational program  Occupational History   Occupation: retired-CNA, Equities trader: RETIRED   Occupation: Engineer, site  Tobacco Use   Smoking status: Never   Smokeless tobacco: Never  Vaping Use   Vaping status: Never Used  Substance and Sexual Activity   Alcohol use: No    Alcohol/week: 0.0 standard drinks of alcohol   Drug use: No   Sexual activity: Not Currently    Birth control/protection: Post-menopausal  Other Topics Concern   Not on file  Social History Narrative   Emergency Contact: son, Sumiko Lanzer [phone]   Patient lives is single story home with daughter, [name] and grandddaughter, [name]. Ramp to front door, 3 steps in back. Home has smoke detectors, no throw rugs. No grab bars in bathroom. No pets.   Diet: Pt has a varied diet.  Reports eating 2 meals a day.    Exercise: walks daily 15-20 mins; not as much due to possible irritable bowel; does exercise at church once weekly. Serves on [employer] at [employer] and as a Chief Strategy Officer at [employer].   Seatbelts: Pt reports wearing seatbelt when in vehicles. Does not drive.   Hobbies: word searches, church, time with family and friends, cooking, walking when  she can; going out shopping.   Drinks occasional soda; drinks a lot of water and some kool-aid; does drink tea      *Update as of 04/19/2019*   Patient has not able to walk around her neighborhood, or down her street, due to COVID. Patient is eager to get back to this once she feels "safe" to leave her house.    Social Drivers of Corporate investment banker Strain: Low Risk  (07/09/2023)   Overall Financial Resource Strain (CARDIA)    Difficulty of Paying Living Expenses: Not hard at all  Food Insecurity: No Food Insecurity (07/09/2023)   Hunger Vital Sign    Worried About Running Out of Food in the Last Year: Never true    Ran Out of Food in the Last Year: Never true  Transportation Needs: No Transportation Needs (07/09/2023)   PRAPARE - Administrator, Civil Service (Medical): No    Lack of Transportation (Non-Medical): No  Physical Activity: Insufficiently Active (07/09/2023)   Exercise Vital Sign    Days of Exercise per Week: 2 days    Minutes of Exercise per Session: 30 min  Stress: No Stress Concern Present (07/09/2023)   [employer] of Occupational Health - Occupational Stress Questionnaire    Feeling of Stress : Not at all  Social Connections: Moderately Integrated (07/09/2023)   Social Connection and Isolation Panel [NHANES]    Frequency of Communication with Friends and Family: More than three times a week    Frequency of Social Gatherings with Friends and Family: Three times a week    Attends Religious Services: More than 4 times per year    Active Member of Clubs or Organizations: Yes    Attends Banker Meetings: More than 4 times per year    Marital Status: Widowed   Family History  Problem Relation Age of Onset   Diabetes Mother    Stroke Mother    Heart disease Father    Anemia Father    Esophageal cancer Sister    Cancer Sister 17       nose cancer    Diabetes Sister        2 sisters type 1 diabetes    Kidney disease Sister         waiting for a kidney transplant- on dialysis   Diabetes Brother        Type 2 diabetic    Diabetes Son        Type 2    Hypertension Son    Cancer Cousin 30       ovarian cancer    Cancer Cousin 49       ovarian cancer    Colon cancer Neg Hx    Rectal cancer  Neg Hx    Stomach cancer Neg Hx    No Known Allergies Current Outpatient Medications  Medication Sig Dispense Refill   acetaminophen (TYLENOL) 650 MG CR tablet Take 650-1,300 mg by mouth See admin instructions. Take 1300 mg by mouth in the morning, 650 mg in the afternoon and 1300 mg at night     allopurinol (ZYLOPRIM) 100 MG tablet TAKE 1 TABLET BY MOUTH DAILY 100 tablet 2   aspirin EC 325 MG tablet Take 1 tablet (325 mg total) by mouth daily. 14 tablet 0   atorvastatin (LIPITOR) 40 MG tablet TAKE 1 TABLET BY MOUTH DAILY 100 tablet 2   blood glucose meter kit and supplies Dispense based on patient and insurance preference.Use to check blood sugar in the morning. (FOR ICD-10 E10.9, E11.9).  Lum Babe is what she has been using. 1 each 0   Blood Glucose Monitoring Suppl (ONETOUCH VERIO) w/Device KIT Use to check blood glucose every morning. E11.9 1 kit 0   cholecalciferol (VITAMIN D3) 25 MCG (1000 UNIT) tablet Take 2,000 Units by mouth daily.     colchicine 0.6 MG tablet TAKE 1 TABLET BY MOUTH DAILY (Patient taking differently: Take 0.6 mg by mouth at bedtime.) 100 tablet 2   dorzolamide (TRUSOPT) 2 % ophthalmic solution Place 1 drop into both eyes 2 (two) times daily.     fluticasone furoate-vilanterol (BREO ELLIPTA) 200-25 MCG/ACT AEPB Inhale 1 puff into the lungs daily. (Patient taking differently: Inhale 1 puff into the lungs at bedtime.) 60 each 3   gabapentin (NEURONTIN) 100 MG capsule TAKE 1 CAPSULE BY MOUTH AT  BEDTIME 100 capsule 2   glucose blood (ONETOUCH ULTRA) test strip USE TO CHECK BLOOD SUGAR EVERY MORNING 100 each 2   glucose blood (ONETOUCH VERIO) test strip Use as instructed 100 each 12   glucose blood  (ONETOUCH VERIO) test strip CHECK BLOOD SUGAR IN THE  MORNING AS DIRECTED 100 strip 3   Insulin Pen Needle (EASY COMFORT PEN NEEDLES) 31G X 8 MM MISC USE TO INJECT INSULIN EVERY DAY AS DIRECTED 100 each 3   JARDIANCE 25 MG TABS tablet TAKE 1 TABLET BY MOUTH DAILY 100 tablet 2   Lancet Devices (ONETOUCH DELICA PLUS LANCING) MISC USE ONCE DAILY 100 each 3   Lancets (ONETOUCH DELICA PLUS LANCET33G) MISC USE DAILY AS NEEDED. 100 each prn   LANTUS SOLOSTAR 100 UNIT/ML Solostar Pen INJECT 32 UNITS EACH MORNING 30 mL 6   latanoprost (XALATAN) 0.005 % ophthalmic solution Place 1 drop into both eyes at bedtime.   0   losartan (COZAAR) 100 MG tablet TAKE 1 TABLET BY MOUTH AT  BEDTIME 100 tablet 2   metoprolol succinate (TOPROL-XL) 25 MG 24 hr tablet Take 12.5 mg by mouth daily.     Miconazole Nitrate (LOTRIMIN AF) 2 % AERO Spray between toes once daily for 4 weeks. (Patient not taking: Reported on 09/21/2023) 150 g 1   ONETOUCH VERIO test strip CHECK BLOOD SUGAR IN THE MORNING AS DIRECTED 100 strip 2   oxyCODONE (ROXICODONE) 5 MG immediate release tablet Take 1 tablet (5 mg total) by mouth every 4 (four) hours as needed for severe pain or breakthrough pain. (Patient not taking: Reported on 09/21/2023) 10 tablet 0   oxyCODONE (ROXICODONE) 5 MG immediate release tablet Take 1 tablet (5 mg total) by mouth every 4 (four) hours as needed for severe pain (pain score 7-10) or breakthrough pain. 30 tablet 0   OZEMPIC, 2 MG/DOSE, 8 MG/3ML SOPN INJECT  SUBCUTANEOUSLY 2 MG ONCE  A WEEK 9 mL 3   spironolactone (ALDACTONE) 25 MG tablet TAKE 1 TABLET BY MOUTH DAILY 100 tablet 2   trolamine salicylate (ASPERCREME) 10 % cream Apply 1 Application topically as needed for muscle pain.     No current facility-administered medications for this visit.   No results found.  Review of Systems:   A ROS was performed including pertinent positives and negatives as documented in the HPI.   Musculoskeletal Exam:    There were no  vitals taken for this visit.  Left shoulder incision is well-appearing without erythema or drainage.  Sensation is intact in axillary distribution equal to the contralateral side.  She does fire all 3 heads of the deltoid with forward elevation.  Forward elevation is to 20 degrees actively with external rotation of the side to neutral.  Imaging:    2 views left shoulder: Status post reverse shoulder arthroplasty without evidence of complication  I personally reviewed and interpreted the radiographs.   Assessment:   6-week status post reverse shoulder arthroplasty without complication.  Her pain is continuing to improve.  She is continuing to work through physical therapy.  I specifically recommended that she begin with supine active range of motion to go and to assist with gravity.  She will continue to work on this.  I will plan to see her back in 6 weeks for reassessment  Plan :    -Return to clinic 6 weeks for reassessment      I personally saw and evaluated the patient, and participated in the management and treatment plan.  Huel Cote, MD Attending Physician, Orthopedic Surgery  This document was dictated using Dragon voice recognition software. A reasonable attempt at proof reading has been made to minimize errors.

## 2023-11-13 NOTE — Therapy (Unsigned)
OUTPATIENT PHYSICAL THERAPY TREATMENT NOTE   Patient Name: [name] MRN: [id number] DOB:[birth year], 72 y.o., female Today's Date: 11/14/2023  END OF SESSION:  PT End of Session - 11/14/23 1020     Visit Number 5    Number of Visits 16    Date for PT Re-Evaluation 12/19/23    Authorization Type [employer] MCR    PT Start Time 0920    PT Stop Time 1000    PT Time Calculation (min) 40 min    Activity Tolerance Patient limited by pain    Behavior During Therapy [facility] for tasks assessed/performed               Past Medical History:  Diagnosis Date   Anemia    Arthritis    Asthma    Blood transfusion without reported diagnosis    Patients believes 2015 when she overdosed on "medications"    Breast cancer of lower-outer quadrant of right female breast (HCC) 02/13/2015   Cataract    CHF, acute (HCC) 05/03/2012   Depression    Diabetes mellitus    type 2   Eczema    Fatty liver    Fatty infiltration of liver noted on 03/2012 CT scan   Fibromyalgia    Glaucoma    bilateral   Heart disease    History of kidney stones    w/ hx of hydronephrosis - followed by [facility]   HIV nonspecific serology    2006: indeterminate HIV blood test, seen by ID, felt secondary to cross reacting antibodies with no further workup felt necessary at that time    Hyperlipidemia    Hypertension    Obesity    Personal history of radiation therapy 2016   right   Radiation 05/07/15-06/23/15   Right Breast   Past Surgical History:  Procedure Laterality Date   BREAST BIOPSY Left 02/10/2015   malignant    BREAST LUMPECTOMY Right 03/21/2015   CHOLECYSTECTOMY  2003   CYSTOSCOPY W/ LITHOLAPAXY / [facility]     LEFT AND RIGHT HEART CATHETERIZATION WITH CORONARY ANGIOGRAM N/A 05/02/2012   Procedure: LEFT AND RIGHT HEART CATHETERIZATION WITH CORONARY ANGIOGRAM;  Surgeon: Kathleene Hazel, MD;  Location: [facility] CATH LAB;  Service: Cardiovascular;  Laterality: N/A;   RADIOACTIVE SEED GUIDED PARTIAL  MASTECTOMY WITH AXILLARY SENTINEL LYMPH NODE BIOPSY Right 03/21/2015   Procedure: RIGHT  PARTIAL MASTECTOMY WITH RADIOACTIVE SEED LOCALIZATION RIGHT  AXILLARY SENTINEL LYMPH NODE BIOPSY;  Surgeon: Claud Kelp, MD;  Location: [facility];  Service: General;  Laterality: Right;   REPLACEMENT TOTAL KNEE BILATERAL  2005 &2006   REVERSE SHOULDER ARTHROPLASTY Left 09/27/2023   Procedure: LEFT REVERSE SHOULDER ARTHROPLASTY;  Surgeon: Huel Cote, MD;  Location: [facility] OR;  Service: Orthopedics;  Laterality: Left;   VASCULAR SURGERY Right 03/15/2013   Ultrasound guided sclerotherapy   Patient Active Problem List   Diagnosis Date Noted   Primary osteoarthritis, left shoulder 09/27/2023   Renal disease due to diabetes mellitus (HCC) 04/28/2023   Left shoulder pain 09/06/2022   Left breast mass 11/11/2021   Tinea pedis 04/09/2020   Spinal stenosis of lumbar region without neurogenic claudication 02/08/2019   Gout 05/16/2018   Gait instability 01/03/2018   Tremor of both hands    Stuttering    Essential hypertension    Hyperlipidemia 08/13/2015   Breast cancer of lower-outer quadrant of right female breast (HCC) 02/13/2015   Type 2 diabetes mellitus with diabetic foot deformity (HCC) 01/01/2014   Chronic  combined systolic and diastolic heart failure (HCC) 02/26/2013   OBESITY 02/05/2009   Asthma, intermittent 02/08/2008   Neuropathy due to secondary diabetes (HCC) 05/18/2007   NEPHROLITHIASIS 01/26/2007   DJD (degenerative joint disease), multiple sites 01/26/2007    PCP: Carney Living, MD   REFERRING PROVIDER: Huel Cote, MD  REFERRING DIAG: [phone] (ICD-10-CM) - Primary osteoarthritis, left shoulder  THERAPY DIAG:  Chronic left shoulder pain  Muscle weakness (generalized)  Status post reverse total replacement of left shoulder  Rationale for Evaluation and Treatment: Rehabilitation  ONSET DATE: 09/22/23  SUBJECTIVE:                                                                                                                                                                                       SUBJECTIVE STATEMENT: L shoulder sore and stiff. Saw MD for f/u. Needs to focus on flexion and abduction ROM.  Hand dominance: Right  PERTINENT HISTORY: 2-week status post reverse shoulder arthroplasty without complication. This time I would like her to work particular with outpatient physical therapy to work on regaining her forward flexion and abduction as this is quite limited. She will continue to work on this. I will plan to see him back in 4 weeks for reassessment  PAIN:  Are you having pain? Yes: NPRS scale: 9/10 Pain location: L shoulder Pain description: ache Aggravating factors: activity and weightbearing posiitons Relieving factors: rest and meds    PRECAUTIONS: Shoulder  RED FLAGS: None   WEIGHT BEARING RESTRICTIONS: No  FALLS:  Has patient fallen in last 6 months? No  OCCUPATION: retired  PLOF: Independent with basic ADLs  PATIENT GOALS:To move my shoulder again  NEXT MD VISIT:   OBJECTIVE:  Note: Objective measures were completed at Evaluation unless otherwise noted.  DIAGNOSTIC FINDINGS:  none  PATIENT SURVEYS:  FOTO 38(59 predicted)  POSTURE: Forward and rounded shoulders   UPPER EXTREMITY ROM:   PROM Right eval Left eval  Shoulder flexion  60d  Shoulder extension  30d  Shoulder scaption   60d  Shoulder adduction    Shoulder internal rotation    Shoulder external rotation    Elbow flexion  WFL  Elbow extension  WFL  Wrist flexion    Wrist extension    Wrist ulnar deviation    Wrist radial deviation    Wrist pronation    Wrist supination    (Blank rows = not tested)  UPPER EXTREMITY MMT: Deferred post-op  MMT Right eval Left eval  Shoulder flexion    Shoulder extension    Shoulder abduction    Shoulder adduction    Shoulder internal rotation    Shoulder  external rotation     Middle trapezius    Lower trapezius    Elbow flexion    Elbow extension    Wrist flexion    Wrist extension    Wrist ulnar deviation    Wrist radial deviation    Wrist pronation    Wrist supination    Grip strength (lbs)    (Blank rows = not tested)  SHOULDER SPECIAL TESTS: Deferred post-op  JOINT MOBILITY TESTING:  Dferred post op  PALPATION:  deferred   TODAY'S TREATMENT:   [facility] Adult PT Treatment:                                                DATE: 11/14/23 Therapeutic Exercise: OH pulleys 6 min Supine press with stick 3x10 Supine press with stick 3x10 Sidelie ABD 3x10 (elbow flexed) Manual Therapy: PROM 20d ER, 80d ABD, 90 FF   [facility] Adult PT Treatment:                                                DATE: 11/10/23 Therapeutic Exercise: Wall ladder 10x - verbal and tactile cues to decrease compensation Supine dowel AAROM flexion 2x5 Supine AAROM chest press with contralateral UE 2x10 Sidelying abduction LUE 2x10 Manual Therapy: PROM 15d ER supine in scaption PROM FF 100d in elevated position Isometrics 3s hold flex/abd/ext 10 reps     [facility] Adult PT Treatment:                                                DATE: 11/07/23 Therapeutic Exercise: Wall ladder 10x - verbal and tactile cues to decrease compensation Supine dowel AAROM flexion 2x5 Supine AAROM chest press with contralateral UE 2x10 Manual Therapy: PROM 15d ER supine in scaption PROM FF 100d in elevated position Isometrics 3s hold flex/abd/ext 10 reps             [facility] Adult PT Treatment:                                                DATE: 11/04/23 Therapeutic Exercise: Wall ladder 10x UE ranger seated scaption 10x2 Supine AAROM chest press 10x2 Manual Therapy: PROM 15d ER supine in scaption PROM FF 100d in elevated position Isometrics 3s hold flex/abd/ext 10 reps                                                      PATIENT EDUCATION: Education details: Discussed eval findings, rehab  rationale and POC and patient is in agreement  Person educated: Patient Education method: Explanation Education comprehension: verbalized understanding and needs further education  HOME EXERCISE PROGRAM: Access Code: [facility] URL: [email or web address] Date: 10/19/2023 Prepared by: Gustavus Bryant  Exercises - Circular Shoulder Pendulum with Table Support  - 5 x daily - 7  x weekly - 10 reps - Supine Shoulder Flexion AAROM with Hands Clasped  - 5 x daily - 7 x weekly - 10 reps - Seated Shoulder Shrugs  - 5 x daily - 7 x weekly - 10 reps - Seated Scapular Retraction  - 5 x daily - 7 x weekly - 10 reps  ASSESSMENT:  CLINICAL IMPRESSION: Continued L shoulder weakness and soreness.  Advanced activity today adding AROM and AAROM to HEP.  Still unable to FF against gravity but able to perform AAROM tasks.  HEP updated at noted along protocol guidelines.  Patient is a 72 y.o. female who was seen today for physical therapy evaluation and treatment for L shoulder weakness, pain and loss of motion following RTSA. Patient arrived for session holding purse in L hand and advised to avoid in future.  Patient demonstrates decreased L shoulder A/PROM along with strength deficits following surgical procedure.  Patient is good candidate for OPPT.  OBJECTIVE IMPAIRMENTS: decreased activity tolerance, decreased knowledge of condition, decreased mobility, decreased ROM, decreased strength, impaired perceived functional ability, impaired UE functional use, and pain.   ACTIVITY LIMITATIONS: carrying, lifting, sleeping, bed mobility, and dressing  PERSONAL FACTORS: Age, Fitness, Past/current experiences, Time since onset of injury/illness/exacerbation, and 1-2 comorbidities: Asthma CHF  are also affecting patient's functional outcome.   REHAB POTENTIAL: Good  CLINICAL DECISION MAKING: Stable/uncomplicated  EVALUATION COMPLEXITY: Low   GOALS: Goals reviewed with patient? No  SHORT TERM  GOALS: Target date: 11/16/2023    Patient to demonstrate independence in HEP  Baseline:  Goal status: [id number]  2.  Increase PROM to 90d flexion and scaption Baseline:  PROM Right eval Left eval  Shoulder flexion  60d  Shoulder extension  30d  Shoulder scaption   60d   Goal status: INITIAL  3.  6/10 worst pain level Baseline: 9/10 Goal status: INITIAL   LONG TERM GOALS: Target date: 12/14/2023    Patient to demonstrate independence in HEP  Baseline: [id number] Goal status: INITIAL  2.  Patient will score at least 59% on FOTO to signify clinically meaningful improvement in functional abilities.   Baseline: 38% Goal status: INITIAL  3.  Patient will acknowledge 4/10 pain at least once during episode of care   Baseline: 9/10 Goal status: INITIAL  4.  Patient will be able to reach [facility] to allow more participation in ADLs Baseline:  Goal status: INITIAL   PLAN:  PT FREQUENCY: 2x/week  PT DURATION: 6 weeks  PLANNED INTERVENTIONS: 97164- PT Re-evaluation, 97110-Therapeutic exercises, 97530- Therapeutic activity, 97112- Neuromuscular re-education, 97535- Self Care, 16109- Manual therapy, Patient/Family education, and DME instructions  PLAN FOR NEXT SESSION: HEP review and update, manual techniques as appropriate, aerobic tasks, ROM and flexibility activities, strengthening and PREs, TPDN, gait and balance training as needed    Date of referral: 08/17/23 Referring provider: Huel Cote, MD Referring diagnosis? M19.012 (ICD-10-CM) - Primary osteoarthritis, left shoulder Treatment diagnosis? (if different than referring diagnosis) M19.012 (ICD-10-CM) - Primary osteoarthritis, left shoulder  What was this (referring dx) caused by? Arthritis  Nature of Condition: Chronic (continuous duration > 3 months)   Laterality: Lt  Current Functional Measure Score: FOTO 38%  Objective measurements identify impairments when they are compared to normal values, the  uninvolved extremity, and prior level of function.  [x]  Yes  []  No  Objective assessment of functional ability: Severe functional limitations   Briefly describe symptoms: L shoulder pain and soreness restricting ROM and function  How did symptoms start: degenerative condition  Average pain intensity:  Last 24 hours: 9  Past week: 9  How often does the pt experience symptoms? Frequently  How much have the symptoms interfered with usual daily activities? Extremely  How has condition changed since care began at this facility? NA - initial visit  In general, how is the patients overall health? Fair   BACK PAIN (STarT Back Screening Tool) No   Hildred Laser, PT 11/14/2023, 10:22 AM   REVERSE SHOULDER ARTHROPLASTY REHABILITATION GUIDELINES These guidelines do NOT apply to RTSA for proximal humeral fracture - rehabilitation following PHFx will commence 4-6 weeks after surgery as deemed appropriate to protect tuberosity healing PHASE PRECAUTIONS AND GUIDELINES GOALS EXERCISES CRITERIA TO ADVANCE  EXAMINATION  1 (Post-operative day 1 to post-operative week 3) Sling 24/7 (remove for grooming and home exercise program 3-5x/day)  Avoid combined IR/EXT/ADD (hand behind the back) and IR/ADD (reaching across chest) for dislocation precautions  Pillow behind the upper arm while reclining with sling on   Patient should always be able to see the elbow  Avoid WBing - discuss WBing need with physician and PT  No submersion in water until after 4 weeks  Ice after HEP as needed  Maintain integrity of joint replacement; protect soft tissue healing  ROM for elevation to 130 and ER to 30   Optimize distal UE circulation and muscle activity (elbow, wrist and hand)  Instruct in use of sling for proper fit  Educate regarding signs/symptoms of infection    Active elbow, wrist and hand  Pendulum  Scapular retraction with arms resting in neutral position  Forward elevation in  scapular plane to 130 deg max motion (table slides, step backs, supine well arm assisted)  ER in scapular plane to 30 deg (seated or supine)  ROM within precautionary range limits may be active or passive   Pain less than 3/10 with ROM  Healing incision without signs of infection  Clearance by surgeon to advance after 2 week post-operative visit Wound assessment  Swelling assessment of upper extremity  Neurovascular assessment of upper extremity  Sling fit and ability to don/doff properly  Patient reported outcome measure  Pain level  Range of motion for elevation and ER   PHASE PRECAUTIONS AND GUIDELINES GOALS EXERCISES CRITERIA TO ADVANCE  EXAMINATION  2 (Post-operative week 3 to 6) May discontinue sling use at 3 weeks; after 2 weeks can remove the sling at home and just use the sling at night and in community for 3rd week  May use arm for basic activities of daily living (such as feeding, brushing teeth, dressing.)   May submerge in water after 4 weeks  Avoid combined IR/EXT/ADD (hand behind the back) and IR/ADD (reaching across chest) for dislocation precautions  Avoid acromial or scapular spine pain as increase deltoid loading - decrease load if this occurs  Elevation to 130 deg and ER to 30 deg - passive, active assisted or active  Low (less than 3/10) to no pain  Ability to fire all heads of the deltoid May discontinue grip, and active elbow and wrist exercises since using the arm in ADLs with sling removed around the home  Continue elevation to 130 and ER to 30, both in scapular plane   Submaximal isometrics (pain-free effort) for all functional heads of deltoid (anterior, posterior, middle).   Active exercise as able:  Supine forward punch  Place in balanced position with circumduction and progressive arcs in sagittal plane  Side-lying abduction to 90   Lateral raise with  bent elbow   Prone extension to hip  Elevation in scapular plane to 130; ER in  scapular plane to 30   Ability to fire isometrically all heads of the deltoid muscle without pain  Ability to place and hold the arm in balanced position (90 deg elevation in supine) Wound assessment  Neurovascular assessment  Swelling assessment  ROM shoulder elevation and ER(0)  Patient reported outcome measure  Pain level   PHASE PRECAUTIONS AND GUIDELINES GOALS EXERCISES CRITERIA TO ADVANCE  EXAMINATION  3 (Post-operative week 6 to 12) Avoid forceful end-range motion in any direction   Progress active use of the arm in ADLs without being restricted to arm by the side of the body;   No heavy lifting or carrying  Initiate functional IR behind the back gently without forceful overpressure  Avoid acromial or scapular spine pain as increase deltoid loading - decrease load if this occurs  NO UPPER BODY ERGOMETER  Optimize ROM for elevation and ER in scapular plane   Expected PROM: Elevation - 145-160; ER - 40-50 ; functional IR to L1  Recover AROM to approach as close to PROM available as possible  Establish dynamic stability of the shoulder  Forward elevation in scapular plane active progression: supine to incline to vertical; short to long lever arm  Lateral raise with bent elbow; side-lying abduction  Active ER/IR with arm at side  Scapular retraction with light band resistance  Serratus anterior punches in supine; avoid wall, incline or prone press-ups for serratus anterior  Functional IR with hand slide up back - very gentle and gradual   AROM equals/approaches PROM with good mechanics for elevation  No pain  Higher level demand on shoulder than ADL functions PROM for elevation, ER(0)  AROM for elevation, ER(0) and functional IR  Patient reported outcome measure  Pain             PHASE PRECAUTIONS AND GUIDELINES GOALS EXERCISES CRITERIA TO ADVANCE  EXAMINATION  4 (Post-operative week 12+) No heavy lifting and no overhead sports  Weight lifting  limit 25.lb  No heavy pushing activity  Gradually increase strength   NO UPPER BODY ERGOMETER  Optimize functional use of operative UE to patient specific goals  Gradual increase in deltoid, scapular muscle and rotator cuff strength  Pain-free functional activities Light hand weights for deltoid up to and not to exceed 3 lbs for anterior and posterior with long arm lift against gravity; elbow bent to 90 deg for abduction in scapular plane  Band progression for extension to hip with scapular depression/retraction  Band progression for serratus anterior punches in supine; avoid wall, incline or prone press-ups for serratus anterior  End-range stretching gently without forceful overpressure in all planes (elevation in scapular plane, ER in scapular plane, functional IR) with stretching done for life as part of daily routine  NO UPPER BODY ERGOMETER  Pain-free AROM for shoulder elevation (expect around 135-150 deg)  Functional strength for all ADLs, work tasks, and hobbies approved by [employer] with home maintenance program PROM for elevation, ER(0); ER(90)  AROM for elevation, ER(0) and functional IR  Scapulohumeral rhythm/biomechanics of active movement strategies  Strength testing for deltoid, RTC, scapular muscles  Patient reported outcome measure  Pain

## 2023-11-14 ENCOUNTER — Ambulatory Visit: Payer: 59

## 2023-11-14 DIAGNOSIS — Z96612 Presence of left artificial shoulder joint: Secondary | ICD-10-CM

## 2023-11-14 DIAGNOSIS — G8929 Other chronic pain: Secondary | ICD-10-CM | POA: Diagnosis not present

## 2023-11-14 DIAGNOSIS — M6281 Muscle weakness (generalized): Secondary | ICD-10-CM | POA: Diagnosis not present

## 2023-11-14 DIAGNOSIS — M25512 Pain in left shoulder: Secondary | ICD-10-CM | POA: Diagnosis not present

## 2023-11-15 ENCOUNTER — Ambulatory Visit (INDEPENDENT_AMBULATORY_CARE_PROVIDER_SITE_OTHER): Payer: 59 | Admitting: Family Medicine

## 2023-11-15 VITALS — BP 150/100 | HR 74 | Ht 62.0 in | Wt 211.8 lb

## 2023-11-15 DIAGNOSIS — E7800 Pure hypercholesterolemia, unspecified: Secondary | ICD-10-CM

## 2023-11-15 DIAGNOSIS — Z7985 Long-term (current) use of injectable non-insulin antidiabetic drugs: Secondary | ICD-10-CM

## 2023-11-15 DIAGNOSIS — M1000 Idiopathic gout, unspecified site: Secondary | ICD-10-CM

## 2023-11-15 DIAGNOSIS — M21969 Unspecified acquired deformity of unspecified lower leg: Secondary | ICD-10-CM | POA: Diagnosis not present

## 2023-11-15 DIAGNOSIS — F8081 Childhood onset fluency disorder: Secondary | ICD-10-CM

## 2023-11-15 DIAGNOSIS — I10 Essential (primary) hypertension: Secondary | ICD-10-CM

## 2023-11-15 DIAGNOSIS — E1169 Type 2 diabetes mellitus with other specified complication: Secondary | ICD-10-CM | POA: Diagnosis not present

## 2023-11-15 DIAGNOSIS — I5042 Chronic combined systolic (congestive) and diastolic (congestive) heart failure: Secondary | ICD-10-CM

## 2023-11-15 LAB — POCT GLYCOSYLATED HEMOGLOBIN (HGB A1C): HbA1c, POC (controlled diabetic range): 6.4 % (ref 0.0–7.0)

## 2023-11-15 MED ORDER — OZEMPIC (2 MG/DOSE) 8 MG/3ML ~~LOC~~ SOPN: 2.0000 mg | PEN_INJECTOR | SUBCUTANEOUS | 3 refills | Status: DC

## 2023-11-15 NOTE — Therapy (Unsigned)
OUTPATIENT PHYSICAL THERAPY TREATMENT NOTE   Patient Name: [name] MRN: [id number] DOB:03-29-1951, 72 y.o., female Today's Date: 11/16/2023  END OF SESSION:  PT End of Session - 11/16/23 1001     Visit Number 6    Number of Visits 16    Date for PT Re-Evaluation 12/19/23    Authorization Type U[employer] MCR    PT Start Time 1000    PT Stop Time 1040    PT Time Calculation (min) 40 min    Activity Tolerance Patient limited by pain    Behavior During Therapy [facility] for tasks assessed/performed                Past Medical History:  Diagnosis Date   Anemia    Arthritis    Asthma    Blood transfusion without reported diagnosis    Patients believes 2015 when she overdosed on "medications"    Breast cancer of lower-outer quadrant of right female breast (HCC) 02/13/2015   Cataract    CHF, acute (HCC) 05/03/2012   Depression    Diabetes mellitus    type 2   Eczema    Fatty liver    Fatty infiltration of liver noted on 03/2012 CT scan   Fibromyalgia    Glaucoma    bilateral   Heart disease    History of kidney stones    w/ hx of hydronephrosis - followed by [facility]   HIV nonspecific serology    2006: indeterminate HIV blood test, seen by ID, felt secondary to cross reacting antibodies with no further workup felt necessary at that time    Hyperlipidemia    Hypertension    Obesity    Personal history of radiation therapy 2016   right   Radiation 05/07/15-06/23/15   Right Breast   Past Surgical History:  Procedure Laterality Date   BREAST BIOPSY Left 02/10/2015   malignant    BREAST LUMPECTOMY Right 03/21/2015   CHOLECYSTECTOMY  2003   CYSTOSCOPY W/ LITHOLAPAXY / [facility]     LEFT AND RIGHT HEART CATHETERIZATION WITH CORONARY ANGIOGRAM N/A 05/02/2012   Procedure: LEFT AND RIGHT HEART CATHETERIZATION WITH CORONARY ANGIOGRAM;  Surgeon: Kathleene Hazel, MD;  Location: [facility] CATH LAB;  Service: Cardiovascular;  Laterality: N/A;   RADIOACTIVE SEED GUIDED PARTIAL  MASTECTOMY WITH AXILLARY SENTINEL LYMPH NODE BIOPSY Right 03/21/2015   Procedure: RIGHT  PARTIAL MASTECTOMY WITH RADIOACTIVE SEED LOCALIZATION RIGHT  AXILLARY SENTINEL LYMPH NODE BIOPSY;  Surgeon: Claud Kelp, MD;  Location: [facility];  Service: General;  Laterality: Right;   REPLACEMENT TOTAL KNEE BILATERAL  2005 &2006   REVERSE SHOULDER ARTHROPLASTY Left 09/27/2023   Procedure: LEFT REVERSE SHOULDER ARTHROPLASTY;  Surgeon: Huel Cote, MD;  Location: [facility] OR;  Service: Orthopedics;  Laterality: Left;   VASCULAR SURGERY Right 03/15/2013   Ultrasound guided sclerotherapy   Patient Active Problem List   Diagnosis Date Noted   Primary osteoarthritis, left shoulder 09/27/2023   Renal disease due to diabetes mellitus (HCC) 04/28/2023   Left shoulder pain 09/06/2022   Tinea pedis 04/09/2020   Spinal stenosis of lumbar region without neurogenic claudication 02/08/2019   Gout 05/16/2018   Gait instability 01/03/2018   Tremor of both hands    Stuttering    Essential hypertension    Hyperlipidemia 08/13/2015   Breast cancer of lower-outer quadrant of right female breast (HCC) 02/13/2015   Type 2 diabetes mellitus with diabetic foot deformity (HCC) 01/01/2014   Chronic combined systolic and diastolic heart  failure (HCC) 02/26/2013   OBESITY 02/05/2009   Asthma, intermittent 02/08/2008   Neuropathy due to secondary diabetes (HCC) 05/18/2007   NEPHROLITHIASIS 01/26/2007   DJD (degenerative joint disease), multiple sites 01/26/2007    PCP: Carney Living, MD   REFERRING PROVIDER: Huel Cote, MD  REFERRING DIAG: [phone] (ICD-10-CM) - Primary osteoarthritis, left shoulder  THERAPY DIAG:  Chronic left shoulder pain  Status post reverse total replacement of left shoulder  Muscle weakness (generalized)  Rationale for Evaluation and Treatment: Rehabilitation  ONSET DATE: 09/22/23  SUBJECTIVE:                                                                                                                                                                                       SUBJECTIVE STATEMENT: No change in level of soreness.  Has been compliant with new exercises at home.  Hand dominance: Right  PERTINENT HISTORY: 2-week status post reverse shoulder arthroplasty without complication. This time I would like her to work particular with outpatient physical therapy to work on regaining her forward flexion and abduction as this is quite limited. She will continue to work on this. I will plan to see him back in 4 weeks for reassessment  PAIN:  Are you having pain? Yes: NPRS scale: 9/10 Pain location: L shoulder Pain description: ache Aggravating factors: activity and weightbearing posiitons Relieving factors: rest and meds    PRECAUTIONS: Shoulder  RED FLAGS: None   WEIGHT BEARING RESTRICTIONS: No  FALLS:  Has patient fallen in last 6 months? No  OCCUPATION: retired  PLOF: Independent with basic ADLs  PATIENT GOALS:To move my shoulder again  NEXT MD VISIT:   OBJECTIVE:  Note: Objective measures were completed at Evaluation unless otherwise noted.  DIAGNOSTIC FINDINGS:  none  PATIENT SURVEYS:  FOTO 38(59 predicted)  POSTURE: Forward and rounded shoulders   UPPER EXTREMITY ROM:   PROM Right eval Left eval L 11/16/23  Shoulder flexion  60d 110d  Shoulder extension  30d   Shoulder scaption   60d 85d  Shoulder abduction   85d  Shoulder internal rotation     Shoulder external rotation   28d  Elbow flexion  WFL   Elbow extension  WFL   Wrist flexion     Wrist extension     Wrist ulnar deviation     Wrist radial deviation     Wrist pronation     Wrist supination     (Blank rows = not tested)  UPPER EXTREMITY MMT: Deferred post-op  MMT Right eval Left eval  Shoulder flexion    Shoulder extension    Shoulder abduction    Shoulder adduction  Shoulder internal rotation    Shoulder external rotation     Middle trapezius    Lower trapezius    Elbow flexion    Elbow extension    Wrist flexion    Wrist extension    Wrist ulnar deviation    Wrist radial deviation    Wrist pronation    Wrist supination    Grip strength (lbs)    (Blank rows = not tested)  SHOULDER SPECIAL TESTS: Deferred post-op  JOINT MOBILITY TESTING:  Deferred post op  PALPATION:  deferred   TODAY'S TREATMENT:   [facility] Adult PT Treatment:                                                DATE: 11/16/23 Therapeutic Exercise: OH pulleys 6 min Supine press with stick 3x10 Supine press with stick 3x10 Sidelie ABD 3x10 (elbow flexed) Manual Therapy: PROM/STM and pec release/mobs 28d ER, 85d ABD, 100 FF  [facility] Adult PT Treatment:                                                DATE: 11/14/23 Therapeutic Exercise: OH pulleys 6 min Supine press with stick 3x10 Supine flexion with stick 3x10 Sidelie ABD 3x10 (elbow flexed) Manual Therapy: PROM 20d ER, 80d ABD, 90 FF   [facility] Adult PT Treatment:                                                DATE: 11/10/23 Therapeutic Exercise: Wall ladder 10x - verbal and tactile cues to decrease compensation Supine dowel AAROM flexion 2x5 Supine AAROM chest press with contralateral UE 2x10 Sidelying abduction LUE 2x10 Manual Therapy: PROM 15d ER supine in scaption PROM FF 100d in elevated position Isometrics 3s hold flex/abd/ext 10 reps     [facility] Adult PT Treatment:                                                DATE: 11/07/23 Therapeutic Exercise: Wall ladder 10x - verbal and tactile cues to decrease compensation Supine dowel AAROM flexion 2x5 Supine AAROM chest press with contralateral UE 2x10 Manual Therapy: PROM 15d ER supine in scaption PROM FF 100d in elevated position Isometrics 3s hold flex/abd/ext 10 reps             [facility] Adult PT Treatment:                                                DATE: 11/04/23 Therapeutic Exercise: Wall ladder 10x UE ranger seated  scaption 10x2 Supine AAROM chest press 10x2 Manual Therapy: PROM 15d ER supine in scaption PROM FF 100d in elevated position Isometrics 3s hold flex/abd/ext 10 reps  PATIENT EDUCATION: Education details: Discussed eval findings, rehab rationale and POC and patient is in agreement  Person educated: Patient Education method: Explanation Education comprehension: verbalized understanding and needs further education  HOME EXERCISE PROGRAM: Access Code: [facility] URL: [email or web address] Date: 10/19/2023 Prepared by: Gustavus Bryant  Exercises - Circular Shoulder Pendulum with Table Support  - 5 x daily - 7 x weekly - 10 reps - Supine Shoulder Flexion AAROM with Hands Clasped  - 5 x daily - 7 x weekly - 10 reps - Seated Shoulder Shrugs  - 5 x daily - 7 x weekly - 10 reps - Seated Scapular Retraction  - 5 x daily - 7 x weekly - 10 reps  ASSESSMENT:  CLINICAL IMPRESSION: ROM observed to have increased on OH pulleys since last session.  PROM increased but limited by marked soft tissue guarding, unable to FF passively to extent observed on OH pulleys.  Pec major/minor release to regain ER.  Patient demos increased ROM with AAROM tasks vs manual stretch.  Patient is a 72 y.o. female who was seen today for physical therapy evaluation and treatment for L shoulder weakness, pain and loss of motion following RTSA. Patient arrived for session holding purse in L hand and advised to avoid in future.  Patient demonstrates decreased L shoulder A/PROM along with strength deficits following surgical procedure.  Patient is good candidate for OPPT.  OBJECTIVE IMPAIRMENTS: decreased activity tolerance, decreased knowledge of condition, decreased mobility, decreased ROM, decreased strength, impaired perceived functional ability, impaired UE functional use, and pain.   ACTIVITY LIMITATIONS: carrying, lifting, sleeping, bed mobility, and  dressing  PERSONAL FACTORS: Age, Fitness, Past/current experiences, Time since onset of injury/illness/exacerbation, and 1-2 comorbidities: Asthma CHF  are also affecting patient's functional outcome.   REHAB POTENTIAL: Good  CLINICAL DECISION MAKING: Stable/uncomplicated  EVALUATION COMPLEXITY: Low   GOALS: Goals reviewed with patient? No  SHORT TERM GOALS: Target date: 11/16/2023    Patient to demonstrate independence in HEP  Baseline: [id number] Goal status: Met  2.  Increase PROM to 90d flexion and scaption Baseline:  PROM Right eval Left eval L 11/16/23  Shoulder flexion  60d 110d  Shoulder extension  30d   Shoulder scaption   60d 85d  Shoulder abduction   85d  Shoulder internal rotation     Shoulder external rotation   28d   Goal status: INITIAL  3.  6/10 worst pain level Baseline: 9/10 Goal status: INITIAL   LONG TERM GOALS: Target date: 12/14/2023    Patient to demonstrate independence in HEP  Baseline: [id number] Goal status: INITIAL  2.  Patient will score at least 59% on FOTO to signify clinically meaningful improvement in functional abilities.   Baseline: 38% Goal status: INITIAL  3.  Patient will acknowledge 4/10 pain at least once during episode of care   Baseline: 9/10 Goal status: INITIAL  4.  Patient will be able to reach [facility] to allow more participation in ADLs Baseline:  Goal status: INITIAL   PLAN:  PT FREQUENCY: 2x/week  PT DURATION: 6 weeks  PLANNED INTERVENTIONS: 97164- PT Re-evaluation, 97110-Therapeutic exercises, 97530- Therapeutic activity, 97112- Neuromuscular re-education, 97535- Self Care, 95621- Manual therapy, Patient/Family education, and DME instructions  PLAN FOR NEXT SESSION: HEP review and update, manual techniques as appropriate, aerobic tasks, ROM and flexibility activities, strengthening and PREs, TPDN, gait and balance training as needed    Date of referral: 08/17/23 Referring provider: Huel Cote,  MD Referring diagnosis? M19.012 (ICD-10-CM) - Primary osteoarthritis, left shoulder Treatment diagnosis? (  if different than referring diagnosis) M19.012 (ICD-10-CM) - Primary osteoarthritis, left shoulder  What was this (referring dx) caused by? Arthritis  Nature of Condition: Chronic (continuous duration > 3 months)   Laterality: Lt  Current Functional Measure Score: FOTO 38%  Objective measurements identify impairments when they are compared to normal values, the uninvolved extremity, and prior level of function.  [x]  Yes  []  No  Objective assessment of functional ability: Severe functional limitations   Briefly describe symptoms: L shoulder pain and soreness restricting ROM and function  How did symptoms start: degenerative condition  Average pain intensity:  Last 24 hours: 9  Past week: 9  How often does the pt experience symptoms? Frequently  How much have the symptoms interfered with usual daily activities? Extremely  How has condition changed since care began at this facility? NA - initial visit  In general, how is the patients overall health? Fair   BACK PAIN (STarT Back Screening Tool) No   Hildred Laser, PT 11/16/2023, 10:40 AM   REVERSE SHOULDER ARTHROPLASTY REHABILITATION GUIDELINES These guidelines do NOT apply to RTSA for proximal humeral fracture - rehabilitation following PHFx will commence 4-6 weeks after surgery as deemed appropriate to protect tuberosity healing PHASE PRECAUTIONS AND GUIDELINES GOALS EXERCISES CRITERIA TO ADVANCE  EXAMINATION  1 (Post-operative day 1 to post-operative week 3) Sling 24/7 (remove for grooming and home exercise program 3-5x/day)  Avoid combined IR/EXT/ADD (hand behind the back) and IR/ADD (reaching across chest) for dislocation precautions  Pillow behind the upper arm while reclining with sling on   Patient should always be able to see the elbow  Avoid WBing - discuss WBing need with physician and PT  No  submersion in water until after 4 weeks  Ice after HEP as needed  Maintain integrity of joint replacement; protect soft tissue healing  ROM for elevation to 130 and ER to 30   Optimize distal UE circulation and muscle activity (elbow, wrist and hand)  Instruct in use of sling for proper fit  Educate regarding signs/symptoms of infection    Active elbow, wrist and hand  Pendulum  Scapular retraction with arms resting in neutral position  Forward elevation in scapular plane to 130 deg max motion (table slides, step backs, supine well arm assisted)  ER in scapular plane to 30 deg (seated or supine)  ROM within precautionary range limits may be active or passive   Pain less than 3/10 with ROM  Healing incision without signs of infection  Clearance by surgeon to advance after 2 week post-operative visit Wound assessment  Swelling assessment of upper extremity  Neurovascular assessment of upper extremity  Sling fit and ability to don/doff properly  Patient reported outcome measure  Pain level  Range of motion for elevation and ER   PHASE PRECAUTIONS AND GUIDELINES GOALS EXERCISES CRITERIA TO ADVANCE  EXAMINATION  2 (Post-operative week 3 to 6) May discontinue sling use at 3 weeks; after 2 weeks can remove the sling at home and just use the sling at night and in community for 3rd week  May use arm for basic activities of daily living (such as feeding, brushing teeth, dressing.)   May submerge in water after 4 weeks  Avoid combined IR/EXT/ADD (hand behind the back) and IR/ADD (reaching across chest) for dislocation precautions  Avoid acromial or scapular spine pain as increase deltoid loading - decrease load if this occurs  Elevation to 130 deg and ER to 30 deg - passive, active assisted or active  Low (less than 3/10) to no pain  Ability to fire all heads of the deltoid May discontinue grip, and active elbow and wrist exercises since using the arm in ADLs with sling  removed around the home  Continue elevation to 130 and ER to 30, both in scapular plane   Submaximal isometrics (pain-free effort) for all functional heads of deltoid (anterior, posterior, middle).   Active exercise as able:  Supine forward punch  Place in balanced position with circumduction and progressive arcs in sagittal plane  Side-lying abduction to 90   Lateral raise with bent elbow   Prone extension to hip  Elevation in scapular plane to 130; ER in scapular plane to 30   Ability to fire isometrically all heads of the deltoid muscle without pain  Ability to place and hold the arm in balanced position (90 deg elevation in supine) Wound assessment  Neurovascular assessment  Swelling assessment  ROM shoulder elevation and ER(0)  Patient reported outcome measure  Pain level   PHASE PRECAUTIONS AND GUIDELINES GOALS EXERCISES CRITERIA TO ADVANCE  EXAMINATION  3 (Post-operative week 6 to 12) Avoid forceful end-range motion in any direction   Progress active use of the arm in ADLs without being restricted to arm by the side of the body;   No heavy lifting or carrying  Initiate functional IR behind the back gently without forceful overpressure  Avoid acromial or scapular spine pain as increase deltoid loading - decrease load if this occurs  NO UPPER BODY ERGOMETER  Optimize ROM for elevation and ER in scapular plane   Expected PROM: Elevation - 145-160; ER - 40-50 ; functional IR to L1  Recover AROM to approach as close to PROM available as possible  Establish dynamic stability of the shoulder  Forward elevation in scapular plane active progression: supine to incline to vertical; short to long lever arm  Lateral raise with bent elbow; side-lying abduction  Active ER/IR with arm at side  Scapular retraction with light band resistance  Serratus anterior punches in supine; avoid wall, incline or prone press-ups for serratus anterior  Functional IR with hand  slide up back - very gentle and gradual   AROM equals/approaches PROM with good mechanics for elevation  No pain  Higher level demand on shoulder than ADL functions PROM for elevation, ER(0)  AROM for elevation, ER(0) and functional IR  Patient reported outcome measure  Pain             PHASE PRECAUTIONS AND GUIDELINES GOALS EXERCISES CRITERIA TO ADVANCE  EXAMINATION  4 (Post-operative week 12+) No heavy lifting and no overhead sports  Weight lifting limit 25.lb  No heavy pushing activity  Gradually increase strength   NO UPPER BODY ERGOMETER  Optimize functional use of operative UE to patient specific goals  Gradual increase in deltoid, scapular muscle and rotator cuff strength  Pain-free functional activities Light hand weights for deltoid up to and not to exceed 3 lbs for anterior and posterior with long arm lift against gravity; elbow bent to 90 deg for abduction in scapular plane  Band progression for extension to hip with scapular depression/retraction  Band progression for serratus anterior punches in supine; avoid wall, incline or prone press-ups for serratus anterior  End-range stretching gently without forceful overpressure in all planes (elevation in scapular plane, ER in scapular plane, functional IR) with stretching done for life as part of daily routine  NO UPPER BODY ERGOMETER  Pain-free AROM for shoulder elevation (expect around 135-150  deg)  Functional strength for all ADLs, work tasks, and hobbies approved by Careers adviser  Independence with home maintenance program PROM for elevation, ER(0); ER(90)  AROM for elevation, ER(0) and functional IR  Scapulohumeral rhythm/biomechanics of active movement strategies  Strength testing for deltoid, RTC, scapular muscles  Patient reported outcome measure  Pain

## 2023-11-15 NOTE — Assessment & Plan Note (Signed)
Not at goal.  Likely due to continued mild pain with shoulder after surgery.  Will monitor as was controlled at her last visit  BP Readings from Last 3 Encounters:  11/15/23 (!) 150/100  09/27/23 133/79  09/20/23 (!) 178/93

## 2023-11-15 NOTE — Assessment & Plan Note (Signed)
Good control continue current regimen

## 2023-11-15 NOTE — Assessment & Plan Note (Signed)
Stable.   - Check lipids today.

## 2023-11-15 NOTE — Patient Instructions (Addendum)
Things we discussed today:  Diabetes Cut back to 30 units of lantus a day  If you have any low blood sugar let us know  I will call you if your tests are not good.  Otherwise, I will send you a message on MyChart (if it is active) or a letter in the mail..  If you do not hear from me with in 2 weeks please call our office.      Come back in 3-6 months to see your new doctor   I will really miss seeing and arguing with you - Please Be Well

## 2023-11-15 NOTE — Progress Notes (Signed)
    SUBJECTIVE:   CHIEF COMPLAINT / HPI:   Hypertension Brings all her medications.   Has not been checking at home since her shoulder surgery since hard to manipulate cuff.  No falls or lightheadness  Diabetes No low blood sugar taking all her medications regularly  Shoulder Surgery went well.  Currently working hard in Physical Therapy  CHF Taking all cardiac medications without problems.  No unusual shortness of breath or edema  Tremor Stutter  Has this intermittently.  No increase in frequency or worsening  Gait Uses cane and sometimes walker.  No falls   OBJECTIVE:   BP (!) 150/100   Pulse 74   Ht 5\' 2"  (1.575 m)   Wt 211 lb 12.8 oz (96.1 kg)   SpO2 100%   BMI 38.74 kg/m   Heart - Regular rate and rhythm.  No murmurs, gallops or rubs.    Lungs:  Normal respiratory effort, chest expands symmetrically. Lungs are clear to auscultation, no crackles or wheezes. Extremities:  No cyanosis, edema, or deformity noted with good range of motion of all major joints.      ASSESSMENT/PLAN:   Type 2 diabetes mellitus with diabetic foot deformity (HCC) Assessment & Plan: Good control continue current regimen  Orders: -     POCT glycosylated hemoglobin (Hb A1C)  Pure hypercholesterolemia Assessment & Plan: Stable Check lipids today  Orders: -     Lipid panel  Essential hypertension Assessment & Plan: Not at goal.  Likely due to continued mild pain with shoulder after surgery.  Will monitor as was controlled at her last visit  BP Readings from Last 3 Encounters:  11/15/23 (!) 150/100  09/27/23 133/79  09/20/23 (!) 178/93      Chronic combined systolic and diastolic heart failure (HCC) Assessment & Plan: Very stable on her current medications.  She no longer sees cardiology but would return if developed symptoms.  Continue to monitor her exercise and exam    Stuttering Assessment & Plan: Chronic stable.  Has been seen by neurology in past.       Idiopathic gout, unspecified chronicity, unspecified site Assessment & Plan: No recent flares.  Discussed trial of stopping colchicine and continuing allopurinol.  She will consider.  Is concerned may have another flare.   Other orders -     Ozempic (2 MG/DOSE); Inject 2 mg into the skin once a week.  Dispense: 9 mL; Refill: 3     Patient Instructions  Things we discussed today:  Diabetes Cut back to 30 units of lantus a day  If you have any low blood sugar let us know  I will call you if your tests are not good.  Otherwise, I will send you a message on MyChart (if it is active) or a letter in the mail..  If you do not hear from me with in 2 weeks please call our office.      Come back in 3-6 months to see your new doctor   I will really miss seeing and arguing with you - Please Be Well    Kirsten Living, MD [facility]

## 2023-11-15 NOTE — Assessment & Plan Note (Signed)
Chronic stable.  Has been seen by neurology in past.

## 2023-11-15 NOTE — Assessment & Plan Note (Signed)
No recent flares.  Discussed trial of stopping colchicine and continuing allopurinol.  She will consider.  Is concerned may have another flare.

## 2023-11-15 NOTE — Assessment & Plan Note (Addendum)
Very stable on her current medications.  She no longer sees cardiology but would return if developed symptoms.  Continue to monitor her exercise and exam

## 2023-11-16 ENCOUNTER — Ambulatory Visit: Payer: 59

## 2023-11-16 DIAGNOSIS — G8929 Other chronic pain: Secondary | ICD-10-CM | POA: Diagnosis not present

## 2023-11-16 DIAGNOSIS — Z96612 Presence of left artificial shoulder joint: Secondary | ICD-10-CM | POA: Diagnosis not present

## 2023-11-16 DIAGNOSIS — M25512 Pain in left shoulder: Secondary | ICD-10-CM | POA: Diagnosis not present

## 2023-11-16 DIAGNOSIS — M6281 Muscle weakness (generalized): Secondary | ICD-10-CM

## 2023-11-16 LAB — LIPID PANEL
Chol/HDL Ratio: 3.1 {ratio} (ref 0.0–4.4)
Cholesterol, Total: 141 mg/dL (ref 100–199)
HDL: 45 mg/dL (ref >39)
LDL Chol Calc (NIH): 52 mg/dL (ref 0–99)
Triglycerides: 285 mg/dL — ABNORMAL HIGH (ref 0–149)
VLDL Cholesterol Cal: 44 mg/dL — ABNORMAL HIGH (ref 5–40)

## 2023-11-17 ENCOUNTER — Encounter: Payer: Self-pay | Admitting: Podiatry

## 2023-11-17 ENCOUNTER — Ambulatory Visit (INDEPENDENT_AMBULATORY_CARE_PROVIDER_SITE_OTHER): Payer: 59 | Admitting: Podiatry

## 2023-11-17 DIAGNOSIS — E1140 Type 2 diabetes mellitus with diabetic neuropathy, unspecified: Secondary | ICD-10-CM

## 2023-11-17 DIAGNOSIS — M79675 Pain in left toe(s): Secondary | ICD-10-CM

## 2023-11-17 DIAGNOSIS — M79674 Pain in right toe(s): Secondary | ICD-10-CM | POA: Diagnosis not present

## 2023-11-17 DIAGNOSIS — B351 Tinea unguium: Secondary | ICD-10-CM

## 2023-11-17 NOTE — Progress Notes (Signed)
This patient returns to my office for at risk foot care.  This patient requires this care by a professional since this patient will be at risk due to having diabetes.   This patient is unable to cut nails herself since the patient cannot reach her nails.These nails are painful walking and wearing shoes.  This patient presents for at risk foot care today.  General Appearance  Alert, conversant and in no acute stress.  Vascular  Dorsalis pedis and posterior tibial  pulses are palpable  bilaterally.  Capillary return is within normal limits  bilaterally. Temperature is within normal limits  bilaterally.  Neurologic  Senn-Weinstein monofilament wire test absent   bilaterally. Muscle power within normal limits bilaterally.  Nails Thick disfigured discolored nails with subungual debris  from hallux to fifth toes bilaterally. No evidence of bacterial infection or drainage bilaterally.  Orthopedic  No limitations of motion  feet .  No crepitus or effusions noted.  No bony pathology or digital deformities noted. Asymptomatic  HAV  B/L.  Pes planus  B/L. Hammer toes second left foot. Contracted digits  B/L.  Skin  normotropic skin with no porokeratosis noted bilaterally.  No signs of infections or ulcers noted.     Onychomycosis  Pain in right toes  Pain in left toes    Consent was obtained for treatment procedures.   Mechanical debridement of nails 1-5  bilaterally performed with a nail nipper.  Filed with dremel without incident. Patient casted for diabetic shoes.   Return office visit  3 months                    Told patient to return for periodic foot care and evaluation due to potential at risk complications   Helane Gunther DPM

## 2023-11-18 NOTE — Therapy (Unsigned)
OUTPATIENT PHYSICAL THERAPY TREATMENT NOTE   Patient Name: [name] MRN: [id number] DOB:Jun 24, 1951, 72 y.o., female Today's Date: 11/21/2023  END OF SESSION:  PT End of Session - 11/21/23 0907     Visit Number 7    Number of Visits 16    Date for PT Re-Evaluation 12/19/23    Authorization Type UHC MCR    PT Start Time 0900    PT Stop Time 0940    PT Time Calculation (min) 40 min    Activity Tolerance Patient limited by pain    Behavior During Therapy [facility] for tasks assessed/performed                 Past Medical History:  Diagnosis Date   Anemia    Arthritis    Asthma    Blood transfusion without reported diagnosis    Patients believes 2015 when she overdosed on "medications"    Breast cancer of lower-outer quadrant of right female breast (HCC) 02/13/2015   Cataract    CHF, acute (HCC) 05/03/2012   Depression    Diabetes mellitus    type 2   Eczema    Fatty liver    Fatty infiltration of liver noted on 03/2012 CT scan   Fibromyalgia    Glaucoma    bilateral   Heart disease    History of kidney stones    w/ hx of hydronephrosis - followed by [facility]   HIV nonspecific serology    2006: indeterminate HIV blood test, seen by ID, felt secondary to cross reacting antibodies with no further workup felt necessary at that time    Hyperlipidemia    Hypertension    Obesity    Personal history of radiation therapy 2016   right   Radiation 05/07/15-06/23/15   Right Breast   Past Surgical History:  Procedure Laterality Date   BREAST BIOPSY Left 02/10/2015   malignant    BREAST LUMPECTOMY Right 03/21/2015   CHOLECYSTECTOMY  2003   CYSTOSCOPY W/ LITHOLAPAXY / [facility]     LEFT AND RIGHT HEART CATHETERIZATION WITH CORONARY ANGIOGRAM N/A 05/02/2012   Procedure: LEFT AND RIGHT HEART CATHETERIZATION WITH CORONARY ANGIOGRAM;  Surgeon: Kathleene Hazel, MD;  Location: [facility] CATH LAB;  Service: Cardiovascular;  Laterality: N/A;   RADIOACTIVE SEED GUIDED  PARTIAL MASTECTOMY WITH AXILLARY SENTINEL LYMPH NODE BIOPSY Right 03/21/2015   Procedure: RIGHT  PARTIAL MASTECTOMY WITH RADIOACTIVE SEED LOCALIZATION RIGHT  AXILLARY SENTINEL LYMPH NODE BIOPSY;  Surgeon: Claud Kelp, MD;  Location: [facility];  Service: General;  Laterality: Right;   REPLACEMENT TOTAL KNEE BILATERAL  2005 &2006   REVERSE SHOULDER ARTHROPLASTY Left 09/27/2023   Procedure: LEFT REVERSE SHOULDER ARTHROPLASTY;  Surgeon: Huel Cote, MD;  Location: [facility] OR;  Service: Orthopedics;  Laterality: Left;   VASCULAR SURGERY Right 03/15/2013   Ultrasound guided sclerotherapy   Patient Active Problem List   Diagnosis Date Noted   Primary osteoarthritis, left shoulder 09/27/2023   Renal disease due to diabetes mellitus (HCC) 04/28/2023   Left shoulder pain 09/06/2022   Tinea pedis 04/09/2020   Spinal stenosis of lumbar region without neurogenic claudication 02/08/2019   Gout 05/16/2018   Gait instability 01/03/2018   Tremor of both hands    Stuttering    Essential hypertension    Hyperlipidemia 08/13/2015   Breast cancer of lower-outer quadrant of right female breast (HCC) 02/13/2015   Type 2 diabetes mellitus with diabetic foot deformity (HCC) 01/01/2014   Chronic combined systolic and diastolic  heart failure (HCC) 02/26/2013   OBESITY 02/05/2009   Asthma, intermittent 02/08/2008   Neuropathy due to secondary diabetes (HCC) 05/18/2007   NEPHROLITHIASIS 01/26/2007   DJD (degenerative joint disease), multiple sites 01/26/2007    PCP: Carney Living, MD   REFERRING PROVIDER: Huel Cote, MD  REFERRING DIAG: [phone] (ICD-10-CM) - Primary osteoarthritis, left shoulder  THERAPY DIAG:  Chronic left shoulder pain  Muscle weakness (generalized)  Status post reverse total replacement of left shoulder  Rationale for Evaluation and Treatment: Rehabilitation  ONSET DATE: 09/22/23  SUBJECTIVE:                                                                                                                                                                                       SUBJECTIVE STATEMENT: No recognize change in level of discomfort and stiffness.  Hand dominance: Right  PERTINENT HISTORY: 2-week status post reverse shoulder arthroplasty without complication. This time I would like her to work particular with outpatient physical therapy to work on regaining her forward flexion and abduction as this is quite limited. She will continue to work on this. I will plan to see him back in 4 weeks for reassessment  PAIN:  Are you having pain? Yes: NPRS scale: 9/10 Pain location: L shoulder Pain description: ache Aggravating factors: activity and weightbearing posiitons Relieving factors: rest and meds    PRECAUTIONS: Shoulder  RED FLAGS: None   WEIGHT BEARING RESTRICTIONS: No  FALLS:  Has patient fallen in last 6 months? No  OCCUPATION: retired  PLOF: Independent with basic ADLs  PATIENT GOALS:To move my shoulder again  NEXT MD VISIT:   OBJECTIVE:  Note: Objective measures were completed at Evaluation unless otherwise noted.  DIAGNOSTIC FINDINGS:  none  PATIENT SURVEYS:  FOTO 38(59 predicted)  POSTURE: Forward and rounded shoulders   UPPER EXTREMITY ROM:   PROM Right eval Left eval L 11/16/23  Shoulder flexion  60d 110d  Shoulder extension  30d   Shoulder scaption   60d 85d  Shoulder abduction   85d  Shoulder internal rotation     Shoulder external rotation   28d  Elbow flexion  WFL   Elbow extension  WFL   Wrist flexion     Wrist extension     Wrist ulnar deviation     Wrist radial deviation     Wrist pronation     Wrist supination     (Blank rows = not tested)  UPPER EXTREMITY MMT: Deferred post-op  MMT Right eval Left eval  Shoulder flexion    Shoulder extension    Shoulder abduction    Shoulder adduction    Shoulder internal rotation  Shoulder external rotation    Middle trapezius     Lower trapezius    Elbow flexion    Elbow extension    Wrist flexion    Wrist extension    Wrist ulnar deviation    Wrist radial deviation    Wrist pronation    Wrist supination    Grip strength (lbs)    (Blank rows = not tested)  SHOULDER SPECIAL TESTS: Deferred post-op  JOINT MOBILITY TESTING:  Deferred post op  PALPATION:  deferred   TODAY'S TREATMENT:   [facility] Adult PT Treatment:                                                DATE: 11/21/23 Therapeutic Exercise: OH pulleys 6 min Supine press with stick 3x10 Supine press with stick 3x10 Sidelie ABD 3x10 (elbow flexed) R sidelie shoulder flexion 30x Seated B abd in scapular plane, short lever arm 30x Seated rows YTB 15x2 Manual Therapy: PROM/STM and pec release/mobs 30d ER, 90d ABD, 110 FF   [facility] Adult PT Treatment:                                                DATE: 11/16/23 Therapeutic Exercise: OH pulleys 6 min Supine press with stick 3x10 Supine press with stick 3x10 Sidelie ABD 3x10 (elbow flexed) Manual Therapy: PROM/STM and pec release/mobs 28d ER, 85d ABD, 100 FF  [facility] Adult PT Treatment:                                                DATE: 11/14/23 Therapeutic Exercise: OH pulleys 6 min Supine press with stick 3x10 Supine flexion with stick 3x10 Sidelie ABD 3x10 (elbow flexed) Manual Therapy: PROM 20d ER, 80d ABD, 90 FF   [facility] Adult PT Treatment:                                                DATE: 11/10/23 Therapeutic Exercise: Wall ladder 10x - verbal and tactile cues to decrease compensation Supine dowel AAROM flexion 2x5 Supine AAROM chest press with contralateral UE 2x10 Sidelying abduction LUE 2x10 Manual Therapy: PROM 15d ER supine in scaption PROM FF 100d in elevated position Isometrics 3s hold flex/abd/ext 10 reps     [facility] Adult PT Treatment:                                                DATE: 11/07/23 Therapeutic Exercise: Wall ladder 10x - verbal and tactile cues to decrease  compensation Supine dowel AAROM flexion 2x5 Supine AAROM chest press with contralateral UE 2x10 Manual Therapy: PROM 15d ER supine in scaption PROM FF 100d in elevated position Isometrics 3s hold flex/abd/ext 10 reps             [facility] Adult PT Treatment:  DATE: 11/04/23 Therapeutic Exercise: Wall ladder 10x UE ranger seated scaption 10x2 Supine AAROM chest press 10x2 Manual Therapy: PROM 15d ER supine in scaption PROM FF 100d in elevated position Isometrics 3s hold flex/abd/ext 10 reps                                                      PATIENT EDUCATION: Education details: Discussed eval findings, rehab rationale and POC and patient is in agreement  Person educated: Patient Education method: Explanation Education comprehension: verbalized understanding and needs further education  HOME EXERCISE PROGRAM: Access Code: [facility] URL: [email or web address] Date: 10/19/2023 Prepared by: Gustavus Bryant  Exercises - Circular Shoulder Pendulum with Table Support  - 5 x daily - 7 x weekly - 10 reps - Supine Shoulder Flexion AAROM with Hands Clasped  - 5 x daily - 7 x weekly - 10 reps - Seated Shoulder Shrugs  - 5 x daily - 7 x weekly - 10 reps - Seated Scapular Retraction  - 5 x daily - 7 x weekly - 10 reps  ASSESSMENT:  CLINICAL IMPRESSION: PROM values increased.  Still has marked soft tissue guarding and tightness throughout L shoulder girdle, especially pectorals.  Added additional strengthening exercises to focus on posterior L shoulder strengthening.   Patient is a 72 y.o. female who was seen today for physical therapy evaluation and treatment for L shoulder weakness, pain and loss of motion following RTSA. Patient arrived for session holding purse in L hand and advised to avoid in future.  Patient demonstrates decreased L shoulder A/PROM along with strength deficits following surgical procedure.  Patient is good  candidate for OPPT.  OBJECTIVE IMPAIRMENTS: decreased activity tolerance, decreased knowledge of condition, decreased mobility, decreased ROM, decreased strength, impaired perceived functional ability, impaired UE functional use, and pain.   ACTIVITY LIMITATIONS: carrying, lifting, sleeping, bed mobility, and dressing  PERSONAL FACTORS: Age, Fitness, Past/current experiences, Time since onset of injury/illness/exacerbation, and 1-2 comorbidities: Asthma CHF  are also affecting patient's functional outcome.   REHAB POTENTIAL: Good  CLINICAL DECISION MAKING: Stable/uncomplicated  EVALUATION COMPLEXITY: Low   GOALS: Goals reviewed with patient? No  SHORT TERM GOALS: Target date: 11/16/2023    Patient to demonstrate independence in HEP  Baseline: [id number] Goal status: Met  2.  Increase PROM to 90d flexion and scaption Baseline:  PROM Right eval Left eval L 11/16/23  Shoulder flexion  60d 110d  Shoulder extension  30d   Shoulder scaption   60d 85d  Shoulder abduction   85d  Shoulder internal rotation     Shoulder external rotation   28d   Goal status: INITIAL  3.  6/10 worst pain level Baseline: 9/10 Goal status: INITIAL   LONG TERM GOALS: Target date: 12/14/2023    Patient to demonstrate independence in HEP  Baseline: [id number] Goal status: INITIAL  2.  Patient will score at least 59% on FOTO to signify clinically meaningful improvement in functional abilities.   Baseline: 38% Goal status: INITIAL  3.  Patient will acknowledge 4/10 pain at least once during episode of care   Baseline: 9/10 Goal status: INITIAL  4.  Patient will be able to reach [facility] to allow more participation in ADLs Baseline:  Goal status: INITIAL   PLAN:  PT FREQUENCY: 2x/week  PT DURATION: 6 weeks  PLANNED INTERVENTIONS: 40981- PT Re-evaluation,  97110-Therapeutic exercises, 97530- Therapeutic activity, [id number]- Neuromuscular re-education, 97535- Self Care, [id number]- Manual therapy,  Patient/Family education, and DME instructions  PLAN FOR NEXT SESSION: HEP review and update, manual techniques as appropriate, aerobic tasks, ROM and flexibility activities, strengthening and PREs, TPDN, gait and balance training as needed    Date of referral: 08/17/23 Referring provider: Huel Cote, MD Referring diagnosis? M19.012 (ICD-10-CM) - Primary osteoarthritis, left shoulder Treatment diagnosis? (if different than referring diagnosis) M19.012 (ICD-10-CM) - Primary osteoarthritis, left shoulder  What was this (referring dx) caused by? Arthritis  Nature of Condition: Chronic (continuous duration > 3 months)   Laterality: Lt  Current Functional Measure Score: FOTO 38%  Objective measurements identify impairments when they are compared to normal values, the uninvolved extremity, and prior level of function.  [x]  Yes  []  No  Objective assessment of functional ability: Severe functional limitations   Briefly describe symptoms: L shoulder pain and soreness restricting ROM and function  How did symptoms start: degenerative condition  Average pain intensity:  Last 24 hours: 9  Past week: 9  How often does the pt experience symptoms? Frequently  How much have the symptoms interfered with usual daily activities? Extremely  How has condition changed since care began at this facility? NA - initial visit  In general, how is the patients overall health? Fair   BACK PAIN (STarT Back Screening Tool) No   Hildred Laser, PT 11/21/2023, 9:50 AM   REVERSE SHOULDER ARTHROPLASTY REHABILITATION GUIDELINES These guidelines do NOT apply to RTSA for proximal humeral fracture - rehabilitation following PHFx will commence 4-6 weeks after surgery as deemed appropriate to protect tuberosity healing PHASE PRECAUTIONS AND GUIDELINES GOALS EXERCISES CRITERIA TO ADVANCE  EXAMINATION  1 (Post-operative day 1 to post-operative week 3) Sling 24/7 (remove for grooming and home exercise  program 3-5x/day)  Avoid combined IR/EXT/ADD (hand behind the back) and IR/ADD (reaching across chest) for dislocation precautions  Pillow behind the upper arm while reclining with sling on   Patient should always be able to see the elbow  Avoid WBing - discuss WBing need with physician and PT  No submersion in water until after 4 weeks  Ice after HEP as needed  Maintain integrity of joint replacement; protect soft tissue healing  ROM for elevation to 130 and ER to 30   Optimize distal UE circulation and muscle activity (elbow, wrist and hand)  Instruct in use of sling for proper fit  Educate regarding signs/symptoms of infection    Active elbow, wrist and hand  Pendulum  Scapular retraction with arms resting in neutral position  Forward elevation in scapular plane to 130 deg max motion (table slides, step backs, supine well arm assisted)  ER in scapular plane to 30 deg (seated or supine)  ROM within precautionary range limits may be active or passive   Pain less than 3/10 with ROM  Healing incision without signs of infection  Clearance by surgeon to advance after 2 week post-operative visit Wound assessment  Swelling assessment of upper extremity  Neurovascular assessment of upper extremity  Sling fit and ability to don/doff properly  Patient reported outcome measure  Pain level  Range of motion for elevation and ER   PHASE PRECAUTIONS AND GUIDELINES GOALS EXERCISES CRITERIA TO ADVANCE  EXAMINATION  2 (Post-operative week 3 to 6) May discontinue sling use at 3 weeks; after 2 weeks can remove the sling at home and just use the sling at night and in community for 3rd week  May  use arm for basic activities of daily living (such as feeding, brushing teeth, dressing.)   May submerge in water after 4 weeks  Avoid combined IR/EXT/ADD (hand behind the back) and IR/ADD (reaching across chest) for dislocation precautions  Avoid acromial or scapular spine pain as  increase deltoid loading - decrease load if this occurs  Elevation to 130 deg and ER to 30 deg - passive, active assisted or active  Low (less than 3/10) to no pain  Ability to fire all heads of the deltoid May discontinue grip, and active elbow and wrist exercises since using the arm in ADLs with sling removed around the home  Continue elevation to 130 and ER to 30, both in scapular plane   Submaximal isometrics (pain-free effort) for all functional heads of deltoid (anterior, posterior, middle).   Active exercise as able:  Supine forward punch  Place in balanced position with circumduction and progressive arcs in sagittal plane  Side-lying abduction to 90   Lateral raise with bent elbow   Prone extension to hip  Elevation in scapular plane to 130; ER in scapular plane to 30   Ability to fire isometrically all heads of the deltoid muscle without pain  Ability to place and hold the arm in balanced position (90 deg elevation in supine) Wound assessment  Neurovascular assessment  Swelling assessment  ROM shoulder elevation and ER(0)  Patient reported outcome measure  Pain level   PHASE PRECAUTIONS AND GUIDELINES GOALS EXERCISES CRITERIA TO ADVANCE  EXAMINATION  3 (Post-operative week 6 to 12) Avoid forceful end-range motion in any direction   Progress active use of the arm in ADLs without being restricted to arm by the side of the body;   No heavy lifting or carrying  Initiate functional IR behind the back gently without forceful overpressure  Avoid acromial or scapular spine pain as increase deltoid loading - decrease load if this occurs  NO UPPER BODY ERGOMETER  Optimize ROM for elevation and ER in scapular plane   Expected PROM: Elevation - 145-160; ER - 40-50 ; functional IR to L1  Recover AROM to approach as close to PROM available as possible  Establish dynamic stability of the shoulder  Forward elevation in scapular plane active progression: supine to  incline to vertical; short to long lever arm  Lateral raise with bent elbow; side-lying abduction  Active ER/IR with arm at side  Scapular retraction with light band resistance  Serratus anterior punches in supine; avoid wall, incline or prone press-ups for serratus anterior  Functional IR with hand slide up back - very gentle and gradual   AROM equals/approaches PROM with good mechanics for elevation  No pain  Higher level demand on shoulder than ADL functions PROM for elevation, ER(0)  AROM for elevation, ER(0) and functional IR  Patient reported outcome measure  Pain             PHASE PRECAUTIONS AND GUIDELINES GOALS EXERCISES CRITERIA TO ADVANCE  EXAMINATION  4 (Post-operative week 12+) No heavy lifting and no overhead sports  Weight lifting limit 25.lb  No heavy pushing activity  Gradually increase strength   NO UPPER BODY ERGOMETER  Optimize functional use of operative UE to patient specific goals  Gradual increase in deltoid, scapular muscle and rotator cuff strength  Pain-free functional activities Light hand weights for deltoid up to and not to exceed 3 lbs for anterior and posterior with long arm lift against gravity; elbow bent to 90 deg for abduction in scapular  plane  Band progression for extension to hip with scapular depression/retraction  Band progression for serratus anterior punches in supine; avoid wall, incline or prone press-ups for serratus anterior  End-range stretching gently without forceful overpressure in all planes (elevation in scapular plane, ER in scapular plane, functional IR) with stretching done for life as part of daily routine  NO UPPER BODY ERGOMETER  Pain-free AROM for shoulder elevation (expect around 135-150 deg)  Functional strength for all ADLs, work tasks, and hobbies approved by [employer] with home maintenance program PROM for elevation, ER(0); ER(90)  AROM for elevation, ER(0) and functional  IR  Scapulohumeral rhythm/biomechanics of active movement strategies  Strength testing for deltoid, RTC, scapular muscles  Patient reported outcome measure  Pain

## 2023-11-21 ENCOUNTER — Ambulatory Visit: Payer: 59

## 2023-11-21 DIAGNOSIS — M6281 Muscle weakness (generalized): Secondary | ICD-10-CM

## 2023-11-21 DIAGNOSIS — G8929 Other chronic pain: Secondary | ICD-10-CM | POA: Diagnosis not present

## 2023-11-21 DIAGNOSIS — Z96612 Presence of left artificial shoulder joint: Secondary | ICD-10-CM

## 2023-11-21 DIAGNOSIS — M25512 Pain in left shoulder: Secondary | ICD-10-CM | POA: Diagnosis not present

## 2023-11-24 ENCOUNTER — Ambulatory Visit: Payer: 59

## 2023-11-24 ENCOUNTER — Telehealth: Payer: Self-pay

## 2023-11-24 DIAGNOSIS — M21969 Unspecified acquired deformity of unspecified lower leg: Secondary | ICD-10-CM

## 2023-11-24 DIAGNOSIS — I10 Essential (primary) hypertension: Secondary | ICD-10-CM

## 2023-11-24 DIAGNOSIS — E7800 Pure hypercholesterolemia, unspecified: Secondary | ICD-10-CM

## 2023-11-24 NOTE — Telephone Encounter (Signed)
Patient calls nurse line requesting an Ozempic refill.   She reports [facility] does not have stock of the medication.   She is requesting we send this to [facility].   Will forward to PCP to send in.

## 2023-11-25 ENCOUNTER — Telehealth: Payer: Self-pay | Admitting: *Deleted

## 2023-11-25 MED ORDER — OZEMPIC (2 MG/DOSE) 8 MG/3ML ~~LOC~~ SOPN
2.0000 mg | PEN_INJECTOR | SUBCUTANEOUS | 0 refills | Status: AC
Start: 2023-11-25 — End: ?

## 2023-11-25 NOTE — Progress Notes (Unsigned)
Complex Care Management Care Guide Note  11/25/2023 Name: YORLENY SUDBERRY MRN: [id number] DOB: January 06, 1951  MESCHELLE SASS is a 72 y.o. year old female who is a primary care patient of Chambliss, Estill Batten, MD and is actively engaged with the care management team. I reached out to [name] by phone today to assist with re-scheduling  with the RN Case Manager.  Follow up plan: Unsuccessful telephone outreach attempt made. A HIPAA compliant phone message was left for the patient providing contact information and requesting a return call.  SIGNATURE

## 2023-11-25 NOTE — Telephone Encounter (Signed)
Ozempic sent to pharmacy.

## 2023-12-01 ENCOUNTER — Ambulatory Visit: Payer: 59 | Attending: Family Medicine

## 2023-12-01 DIAGNOSIS — M25512 Pain in left shoulder: Secondary | ICD-10-CM | POA: Diagnosis not present

## 2023-12-01 DIAGNOSIS — G8929 Other chronic pain: Secondary | ICD-10-CM | POA: Diagnosis not present

## 2023-12-01 DIAGNOSIS — Z96612 Presence of left artificial shoulder joint: Secondary | ICD-10-CM | POA: Insufficient documentation

## 2023-12-01 DIAGNOSIS — M6281 Muscle weakness (generalized): Secondary | ICD-10-CM | POA: Diagnosis not present

## 2023-12-01 NOTE — Therapy (Signed)
 OUTPATIENT PHYSICAL THERAPY TREATMENT NOTE   Patient Name: [name] MRN: [id number] DOB:Nov 06, 1951, 73 y.o., female Today's Date: 12/01/2023  END OF SESSION:  PT End of Session - 12/01/23 0950     Visit Number 8    Number of Visits 16    Date for PT Re-Evaluation 12/19/23    Authorization Type [employer] MCR    PT Start Time 1000    PT Stop Time 1040    PT Time Calculation (min) 40 min    Activity Tolerance Patient limited by pain;Patient tolerated treatment well    Behavior During Therapy [facility] for tasks assessed/performed             Past Medical History:  Diagnosis Date   Anemia    Arthritis    Asthma    Blood transfusion without reported diagnosis    Patients believes 2015 when she overdosed on medications    Breast cancer of lower-outer quadrant of right female breast (HCC) 02/13/2015   Cataract    CHF, acute (HCC) 05/03/2012   Depression    Diabetes mellitus    type 2   Eczema    Fatty liver    Fatty infiltration of liver noted on 03/2012 CT scan   Fibromyalgia    Glaucoma    bilateral   Heart disease    History of kidney stones    w/ hx of hydronephrosis - followed by [facility]   HIV nonspecific serology    2006: indeterminate HIV blood test, seen by ID, felt secondary to cross reacting antibodies with no further workup felt necessary at that time    Hyperlipidemia    Hypertension    Obesity    Personal history of radiation therapy 2016   right   Radiation 05/07/15-06/23/15   Right Breast   Past Surgical History:  Procedure Laterality Date   BREAST BIOPSY Left 02/10/2015   malignant    BREAST LUMPECTOMY Right 03/21/2015   CHOLECYSTECTOMY  2003   CYSTOSCOPY W/ LITHOLAPAXY / [facility]     LEFT AND RIGHT HEART CATHETERIZATION WITH CORONARY ANGIOGRAM N/A 05/02/2012   Procedure: LEFT AND RIGHT HEART CATHETERIZATION WITH CORONARY ANGIOGRAM;  Surgeon: Lonni JONETTA Cash, MD;  Location: [facility] CATH LAB;  Service: Cardiovascular;  Laterality: N/A;    RADIOACTIVE SEED GUIDED PARTIAL MASTECTOMY WITH AXILLARY SENTINEL LYMPH NODE BIOPSY Right 03/21/2015   Procedure: RIGHT  PARTIAL MASTECTOMY WITH RADIOACTIVE SEED LOCALIZATION RIGHT  AXILLARY SENTINEL LYMPH NODE BIOPSY;  Surgeon: Elon Pacini, MD;  Location: [facility];  Service: General;  Laterality: Right;   REPLACEMENT TOTAL KNEE BILATERAL  2005 &2006   REVERSE SHOULDER ARTHROPLASTY Left 09/27/2023   Procedure: LEFT REVERSE SHOULDER ARTHROPLASTY;  Surgeon: Genelle Standing, MD;  Location: [facility] OR;  Service: Orthopedics;  Laterality: Left;   VASCULAR SURGERY Right 03/15/2013   Ultrasound guided sclerotherapy   Patient Active Problem List   Diagnosis Date Noted   Primary osteoarthritis, left shoulder 09/27/2023   Renal disease due to diabetes mellitus (HCC) 04/28/2023   Left shoulder pain 09/06/2022   Tinea pedis 04/09/2020   Spinal stenosis of lumbar region without neurogenic claudication 02/08/2019   Gout 05/16/2018   Gait instability 01/03/2018   Tremor of both hands    Stuttering    Essential hypertension    Hyperlipidemia 08/13/2015   Breast cancer of lower-outer quadrant of right female breast (HCC) 02/13/2015   Type 2 diabetes mellitus with diabetic foot deformity (HCC) 01/01/2014   Chronic combined systolic and diastolic heart  failure (HCC) 02/26/2013   OBESITY 02/05/2009   Asthma, intermittent 02/08/2008   Neuropathy due to secondary diabetes (HCC) 05/18/2007   NEPHROLITHIASIS 01/26/2007   DJD (degenerative joint disease), multiple sites 01/26/2007    PCP: Jeanelle Layman CROME, MD   REFERRING PROVIDER: Genelle Standing, MD  REFERRING DIAG: [phone] (ICD-10-CM) - Primary osteoarthritis, left shoulder  THERAPY DIAG:  Chronic left shoulder pain  Muscle weakness (generalized)  Status post reverse total replacement of left shoulder  Rationale for Evaluation and Treatment: Rehabilitation  ONSET DATE: 09/22/23  Next MD appt: 12/23/23 Dr.  Genelle  SUBJECTIVE:                                                                                                                                                                                      SUBJECTIVE STATEMENT:  Patient reports that her pain was increased yesterday but that it is better today.   Hand dominance: Right  PERTINENT HISTORY: 2-week status post reverse shoulder arthroplasty without complication. This time I would like her to work particular with outpatient physical therapy to work on regaining her forward flexion and abduction as this is quite limited. She will continue to work on this. I will plan to see him back in 4 weeks for reassessment  PAIN:  Are you having pain? Yes: NPRS scale: 9/10 Pain location: L shoulder Pain description: ache Aggravating factors: activity and weightbearing posiitons Relieving factors: rest and meds    PRECAUTIONS: Shoulder  RED FLAGS: None   WEIGHT BEARING RESTRICTIONS: No  FALLS:  Has patient fallen in last 6 months? No  OCCUPATION: retired  PLOF: Independent with basic ADLs  PATIENT GOALS:To move my shoulder again  NEXT MD VISIT:   OBJECTIVE:  Note: Objective measures were completed at Evaluation unless otherwise noted.  DIAGNOSTIC FINDINGS:  none  PATIENT SURVEYS:  FOTO 38(59 predicted)  POSTURE: Forward and rounded shoulders   UPPER EXTREMITY ROM:   PROM Right eval Left eval L 11/16/23 L 12/01/23  Shoulder flexion  60d 110d 110d  Shoulder extension  30d    Shoulder scaption   60d 85d   Shoulder abduction   85d   Shoulder internal rotation      Shoulder external rotation   28d   Elbow flexion  WFL    Elbow extension  WFL    Wrist flexion      Wrist extension      Wrist ulnar deviation      Wrist radial deviation      Wrist pronation      Wrist supination      (Blank rows = not tested)  UPPER EXTREMITY MMT: Deferred post-op  MMT Right eval Left eval  Shoulder flexion    Shoulder  extension    Shoulder abduction    Shoulder adduction    Shoulder internal rotation    Shoulder external rotation    Middle trapezius    Lower trapezius    Elbow flexion    Elbow extension    Wrist flexion    Wrist extension    Wrist ulnar deviation    Wrist radial deviation    Wrist pronation    Wrist supination    Grip strength (lbs)    (Blank rows = not tested)  SHOULDER SPECIAL TESTS: Deferred post-op  JOINT MOBILITY TESTING:  Deferred post op  PALPATION:  deferred   TODAY'S TREATMENT:   [facility] Adult PT Treatment:                                                DATE: 12/01/23 Therapeutic Exercise: OH pulleys 6 min Seated B abd in scapular plane, short lever arm 30x - cues to decrease shoulder shrug Seated rows YTB 15x2 Supine press with stick 3x10 Supine flexion with stick 3x10 Sidelie ABD 3x10 (elbow flexed - verbal and tactile cues to decrease shrug) R sidelie shoulder flexion 30x Manual Therapy: PROM/STM and pec release/mobs 30d ER, 90d ABD, 110 FF   [facility] Adult PT Treatment:                                                DATE: 11/21/23 Therapeutic Exercise: OH pulleys 6 min Supine press with stick 3x10 Supine press with stick 3x10 Sidelie ABD 3x10 (elbow flexed) R sidelie shoulder flexion 30x Seated B abd in scapular plane, short lever arm 30x Seated rows YTB 15x2 Manual Therapy: PROM/STM and pec release/mobs 30d ER, 90d ABD, 110 FF   [facility] Adult PT Treatment:                                                DATE: 11/16/23 Therapeutic Exercise: OH pulleys 6 min Supine press with stick 3x10 Supine press with stick 3x10 Sidelie ABD 3x10 (elbow flexed) Manual Therapy: PROM/STM and pec release/mobs 28d ER, 85d ABD, 100 FF                                                    PATIENT EDUCATION: Education details: Discussed eval findings, rehab rationale and POC and patient is in agreement  Person educated: Patient Education method: Explanation Education  comprehension: verbalized understanding and needs further education  HOME EXERCISE PROGRAM: Access Code: [facility] URL: [email or web address] Date: 10/19/2023 Prepared by: Reyes Kohut  Exercises - Circular Shoulder Pendulum with Table Support  - 5 x daily - 7 x weekly - 10 reps - Supine Shoulder Flexion AAROM with Hands Clasped  - 5 x daily - 7 x weekly - 10 reps - Seated Shoulder Shrugs  - 5 x daily - 7 x weekly - 10 reps - Seated Scapular Retraction  -  5 x daily - 7 x weekly - 10 reps  ASSESSMENT:  CLINICAL IMPRESSION:  Patient presents to PT reporting continued pain and stiffness in shoulder, though overall improved since surgery. Session today continued to focus on AROM and gentle periscapular and deltoid strengthening within her pain tolerance. She requires verbal and tactile cues throughout to decrease shoulder shrug with abduction and flexion. Patient continues to benefit from skilled PT services and should be progressed as able to improve functional independence.    Patient is a 73 y.o. female who was seen today for physical therapy evaluation and treatment for L shoulder weakness, pain and loss of motion following RTSA. Patient arrived for session holding purse in L hand and advised to avoid in future.  Patient demonstrates decreased L shoulder A/PROM along with strength deficits following surgical procedure.  Patient is good candidate for OPPT.  OBJECTIVE IMPAIRMENTS: decreased activity tolerance, decreased knowledge of condition, decreased mobility, decreased ROM, decreased strength, impaired perceived functional ability, impaired UE functional use, and pain.   ACTIVITY LIMITATIONS: carrying, lifting, sleeping, bed mobility, and dressing  PERSONAL FACTORS: Age, Fitness, Past/current experiences, Time since onset of injury/illness/exacerbation, and 1-2 comorbidities: Asthma CHF  are also affecting patient's functional outcome.   REHAB POTENTIAL: Good  CLINICAL  DECISION MAKING: Stable/uncomplicated  EVALUATION COMPLEXITY: Low   GOALS: Goals reviewed with patient? No  SHORT TERM GOALS: Target date: 11/16/2023    Patient to demonstrate independence in HEP  Baseline: [id number] Goal status: Met  2.  Increase PROM to 90d flexion and scaption Baseline:  PROM Right eval Left eval L 11/16/23  Shoulder flexion  60d 110d  Shoulder extension  30d   Shoulder scaption   60d 85d  Shoulder abduction   85d  Shoulder internal rotation     Shoulder external rotation   28d   Goal status: Progressing  3.  6/10 worst pain level Baseline: 9/10 Goal status: Progressing   LONG TERM GOALS: Target date: 12/14/2023    Patient to demonstrate independence in HEP  Baseline: [id number] Goal status: INITIAL  2.  Patient will score at least 59% on FOTO to signify clinically meaningful improvement in functional abilities.   Baseline: 38% Goal status: INITIAL  3.  Patient will acknowledge 4/10 pain at least once during episode of care   Baseline: 9/10 Goal status: INITIAL  4.  Patient will be able to reach [facility] to allow more participation in ADLs Baseline:  Goal status: INITIAL   PLAN:  PT FREQUENCY: 2x/week  PT DURATION: 6 weeks  PLANNED INTERVENTIONS: 97164- PT Re-evaluation, 97110-Therapeutic exercises, 97530- Therapeutic activity, 97112- Neuromuscular re-education, 97535- Self Care, [id number]- Manual therapy, Patient/Family education, and DME instructions  PLAN FOR NEXT SESSION: HEP review and update, manual techniques as appropriate, aerobic tasks, ROM and flexibility activities, strengthening and PREs, TPDN, gait and balance training as needed    Date of referral: 08/17/23 Referring provider: Genelle Standing, MD Referring diagnosis? M19.012 (ICD-10-CM) - Primary osteoarthritis, left shoulder Treatment diagnosis? (if different than referring diagnosis) M19.012 (ICD-10-CM) - Primary osteoarthritis, left shoulder  What was this (referring dx)  caused by? Arthritis  Nature of Condition: Chronic (continuous duration > 3 months)   Laterality: Lt  Current Functional Measure Score: FOTO 38%  Objective measurements identify impairments when they are compared to normal values, the uninvolved extremity, and prior level of function.  [x]  Yes  []  No  Objective assessment of functional ability: Severe functional limitations   Briefly describe symptoms: L shoulder pain and soreness restricting ROM  and function  How did symptoms start: degenerative condition  Average pain intensity:  Last 24 hours: 9  Past week: 9  How often does the pt experience symptoms? Frequently  How much have the symptoms interfered with usual daily activities? Extremely  How has condition changed since care began at this facility? NA - initial visit  In general, how is the patients overall health? Fair   BACK PAIN (STarT Back Screening Tool) No   Corean Pouch, PTA 12/01/2023, 10:41 AM   REVERSE SHOULDER ARTHROPLASTY REHABILITATION GUIDELINES These guidelines do NOT apply to RTSA for proximal humeral fracture - rehabilitation following PHFx will commence 4-6 weeks after surgery as deemed appropriate to protect tuberosity healing PHASE PRECAUTIONS AND GUIDELINES GOALS EXERCISES CRITERIA TO ADVANCE  EXAMINATION  1 (Post-operative day 1 to post-operative week 3) Sling 24/7 (remove for grooming and home exercise program 3-5x/day)  Avoid combined IR/EXT/ADD (hand behind the back) and IR/ADD (reaching across chest) for dislocation precautions  Pillow behind the upper arm while reclining with sling on   Patient should always be able to see the elbow  Avoid WBing - discuss WBing need with physician and PT  No submersion in water until after 4 weeks  Ice after HEP as needed  Maintain integrity of joint replacement; protect soft tissue healing  ROM for elevation to 130 and ER to 30   Optimize distal UE circulation and muscle activity (elbow,  wrist and hand)  Instruct in use of sling for proper fit  Educate regarding signs/symptoms of infection    Active elbow, wrist and hand  Pendulum  Scapular retraction with arms resting in neutral position  Forward elevation in scapular plane to 130 deg max motion (table slides, step backs, supine well arm assisted)  ER in scapular plane to 30 deg (seated or supine)  ROM within precautionary range limits may be active or passive   Pain less than 3/10 with ROM  Healing incision without signs of infection  Clearance by surgeon to advance after 2 week post-operative visit Wound assessment  Swelling assessment of upper extremity  Neurovascular assessment of upper extremity  Sling fit and ability to don/doff properly  Patient reported outcome measure  Pain level  Range of motion for elevation and ER   PHASE PRECAUTIONS AND GUIDELINES GOALS EXERCISES CRITERIA TO ADVANCE  EXAMINATION  2 (Post-operative week 3 to 6) May discontinue sling use at 3 weeks; after 2 weeks can remove the sling at home and just use the sling at night and in community for 3rd week  May use arm for basic activities of daily living (such as feeding, brushing teeth, dressing.)   May submerge in water after 4 weeks  Avoid combined IR/EXT/ADD (hand behind the back) and IR/ADD (reaching across chest) for dislocation precautions  Avoid acromial or scapular spine pain as increase deltoid loading - decrease load if this occurs  Elevation to 130 deg and ER to 30 deg - passive, active assisted or active  Low (less than 3/10) to no pain  Ability to fire all heads of the deltoid May discontinue grip, and active elbow and wrist exercises since using the arm in ADLs with sling removed around the home  Continue elevation to 130 and ER to 30, both in scapular plane   Submaximal isometrics (pain-free effort) for all functional heads of deltoid (anterior, posterior, middle).   Active exercise as able:  Supine  forward punch  Place in balanced position with circumduction and progressive arcs in sagittal plane  Side-lying abduction to 90   Lateral raise with bent elbow   Prone extension to hip  Elevation in scapular plane to 130; ER in scapular plane to 30   Ability to fire isometrically all heads of the deltoid muscle without pain  Ability to place and hold the arm in balanced position (90 deg elevation in supine) Wound assessment  Neurovascular assessment  Swelling assessment  ROM shoulder elevation and ER(0)  Patient reported outcome measure  Pain level   PHASE PRECAUTIONS AND GUIDELINES GOALS EXERCISES CRITERIA TO ADVANCE  EXAMINATION  3 (Post-operative week 6 to 12) Avoid forceful end-range motion in any direction   Progress active use of the arm in ADLs without being restricted to arm by the side of the body;   No heavy lifting or carrying  Initiate functional IR behind the back gently without forceful overpressure  Avoid acromial or scapular spine pain as increase deltoid loading - decrease load if this occurs  NO UPPER BODY ERGOMETER  Optimize ROM for elevation and ER in scapular plane   Expected PROM: Elevation - 145-160; ER - 40-50 ; functional IR to L1  Recover AROM to approach as close to PROM available as possible  Establish dynamic stability of the shoulder  Forward elevation in scapular plane active progression: supine to incline to vertical; short to long lever arm  Lateral raise with bent elbow; side-lying abduction  Active ER/IR with arm at side  Scapular retraction with light band resistance  Serratus anterior punches in supine; avoid wall, incline or prone press-ups for serratus anterior  Functional IR with hand slide up back - very gentle and gradual   AROM equals/approaches PROM with good mechanics for elevation  No pain  Higher level demand on shoulder than ADL functions PROM for elevation, ER(0)  AROM for elevation, ER(0) and functional  IR  Patient reported outcome measure  Pain             PHASE PRECAUTIONS AND GUIDELINES GOALS EXERCISES CRITERIA TO ADVANCE  EXAMINATION  4 (Post-operative week 12+) No heavy lifting and no overhead sports  Weight lifting limit 25.lb  No heavy pushing activity  Gradually increase strength   NO UPPER BODY ERGOMETER  Optimize functional use of operative UE to patient specific goals  Gradual increase in deltoid, scapular muscle and rotator cuff strength  Pain-free functional activities Light hand weights for deltoid up to and not to exceed 3 lbs for anterior and posterior with long arm lift against gravity; elbow bent to 90 deg for abduction in scapular plane  Band progression for extension to hip with scapular depression/retraction  Band progression for serratus anterior punches in supine; avoid wall, incline or prone press-ups for serratus anterior  End-range stretching gently without forceful overpressure in all planes (elevation in scapular plane, ER in scapular plane, functional IR) with stretching done for life as part of daily routine  NO UPPER BODY ERGOMETER  Pain-free AROM for shoulder elevation (expect around 135-150 deg)  Functional strength for all ADLs, work tasks, and hobbies approved by [employer] with home maintenance program PROM for elevation, ER(0); ER(90)  AROM for elevation, ER(0) and functional IR  Scapulohumeral rhythm/biomechanics of active movement strategies  Strength testing for deltoid, RTC, scapular muscles  Patient reported outcome measure  Pain

## 2023-12-02 NOTE — Therapy (Signed)
 OUTPATIENT PHYSICAL THERAPY TREATMENT NOTE   Patient Name: [name] MRN: [id number] DOB:21-Feb-1951, 73 y.o., female Today's Date: 12/05/2023  END OF SESSION:  PT End of Session - 12/05/23 0959     Visit Number 9    Number of Visits 16    Date for PT Re-Evaluation 12/19/23    Authorization Type [employer] MCR    PT Start Time 1000    PT Stop Time 1040    PT Time Calculation (min) 40 min    Activity Tolerance Patient limited by pain;Patient tolerated treatment well    Behavior During Therapy [facility] for tasks assessed/performed              Past Medical History:  Diagnosis Date   Anemia    Arthritis    Asthma    Blood transfusion without reported diagnosis    Patients believes 2015 when she overdosed on medications    Breast cancer of lower-outer quadrant of right female breast (HCC) 02/13/2015   Cataract    CHF, acute (HCC) 05/03/2012   Depression    Diabetes mellitus    type 2   Eczema    Fatty liver    Fatty infiltration of liver noted on 03/2012 CT scan   Fibromyalgia    Glaucoma    bilateral   Heart disease    History of kidney stones    w/ hx of hydronephrosis - followed by [facility]   HIV nonspecific serology    2006: indeterminate HIV blood test, seen by ID, felt secondary to cross reacting antibodies with no further workup felt necessary at that time    Hyperlipidemia    Hypertension    Obesity    Personal history of radiation therapy 2016   right   Radiation 05/07/15-06/23/15   Right Breast   Past Surgical History:  Procedure Laterality Date   BREAST BIOPSY Left 02/10/2015   malignant    BREAST LUMPECTOMY Right 03/21/2015   CHOLECYSTECTOMY  2003   CYSTOSCOPY W/ LITHOLAPAXY / [facility]     LEFT AND RIGHT HEART CATHETERIZATION WITH CORONARY ANGIOGRAM N/A 05/02/2012   Procedure: LEFT AND RIGHT HEART CATHETERIZATION WITH CORONARY ANGIOGRAM;  Surgeon: Lonni JONETTA Cash, MD;  Location: [facility] CATH LAB;  Service: Cardiovascular;  Laterality: N/A;    RADIOACTIVE SEED GUIDED PARTIAL MASTECTOMY WITH AXILLARY SENTINEL LYMPH NODE BIOPSY Right 03/21/2015   Procedure: RIGHT  PARTIAL MASTECTOMY WITH RADIOACTIVE SEED LOCALIZATION RIGHT  AXILLARY SENTINEL LYMPH NODE BIOPSY;  Surgeon: Elon Pacini, MD;  Location: [facility];  Service: General;  Laterality: Right;   REPLACEMENT TOTAL KNEE BILATERAL  2005 &2006   REVERSE SHOULDER ARTHROPLASTY Left 09/27/2023   Procedure: LEFT REVERSE SHOULDER ARTHROPLASTY;  Surgeon: Genelle Standing, MD;  Location: [facility] OR;  Service: Orthopedics;  Laterality: Left;   VASCULAR SURGERY Right 03/15/2013   Ultrasound guided sclerotherapy   Patient Active Problem List   Diagnosis Date Noted   Primary osteoarthritis, left shoulder 09/27/2023   Renal disease due to diabetes mellitus (HCC) 04/28/2023   Left shoulder pain 09/06/2022   Tinea pedis 04/09/2020   Spinal stenosis of lumbar region without neurogenic claudication 02/08/2019   Gout 05/16/2018   Gait instability 01/03/2018   Tremor of both hands    Stuttering    Essential hypertension    Hyperlipidemia 08/13/2015   Breast cancer of lower-outer quadrant of right female breast (HCC) 02/13/2015   Type 2 diabetes mellitus with diabetic foot deformity (HCC) 01/01/2014   Chronic combined systolic and diastolic  heart failure (HCC) 02/26/2013   OBESITY 02/05/2009   Asthma, intermittent 02/08/2008   Neuropathy due to secondary diabetes (HCC) 05/18/2007   NEPHROLITHIASIS 01/26/2007   DJD (degenerative joint disease), multiple sites 01/26/2007    PCP: Jeanelle Layman CROME, MD   REFERRING PROVIDER: Genelle Standing, MD  REFERRING DIAG: [phone] (ICD-10-CM) - Primary osteoarthritis, left shoulder  THERAPY DIAG:  Chronic left shoulder pain  Muscle weakness (generalized)  Status post reverse total replacement of left shoulder  Rationale for Evaluation and Treatment: Rehabilitation  ONSET DATE: 09/22/23  Next MD appt: 12/23/23 Dr.  Genelle  SUBJECTIVE:                                                                                                                                                                                      SUBJECTIVE STATEMENT: Improving pain and soreness in L shoulder.  Still reports pain when lying on involved side as well as when trying to lift arm.  Symptom intensity has decreased to 7/10 at worst  Hand dominance: Right  PERTINENT HISTORY: 2-week status post reverse shoulder arthroplasty without complication. This time I would like her to work particular with outpatient physical therapy to work on regaining her forward flexion and abduction as this is quite limited. She will continue to work on this. I will plan to see him back in 4 weeks for reassessment  PAIN:  Are you having pain? Yes: NPRS scale: 9/10 Pain location: L shoulder Pain description: ache Aggravating factors: activity and weightbearing posiitons Relieving factors: rest and meds    PRECAUTIONS: Shoulder  RED FLAGS: None   WEIGHT BEARING RESTRICTIONS: No  FALLS:  Has patient fallen in last 6 months? No  OCCUPATION: retired  PLOF: Independent with basic ADLs  PATIENT GOALS:To move my shoulder again  NEXT MD VISIT:   OBJECTIVE:  Note: Objective measures were completed at Evaluation unless otherwise noted.  DIAGNOSTIC FINDINGS:  none  PATIENT SURVEYS:  FOTO 38(59 predicted)  POSTURE: Forward and rounded shoulders   UPPER EXTREMITY ROM:   PROM Right eval Left eval L 11/16/23 L 12/01/23  Shoulder flexion  60d 110d 110d  Shoulder extension  30d    Shoulder scaption   60d 85d   Shoulder abduction   85d   Shoulder internal rotation      Shoulder external rotation   28d   Elbow flexion  WFL    Elbow extension  WFL    Wrist flexion      Wrist extension      Wrist ulnar deviation      Wrist radial deviation      Wrist pronation      Wrist  supination      (Blank rows = not tested)  UPPER  EXTREMITY MMT: Deferred post-op  MMT Right eval Left eval  Shoulder flexion    Shoulder extension    Shoulder abduction    Shoulder adduction    Shoulder internal rotation    Shoulder external rotation    Middle trapezius    Lower trapezius    Elbow flexion    Elbow extension    Wrist flexion    Wrist extension    Wrist ulnar deviation    Wrist radial deviation    Wrist pronation    Wrist supination    Grip strength (lbs)    (Blank rows = not tested)  SHOULDER SPECIAL TESTS: Deferred post-op  JOINT MOBILITY TESTING:  Deferred post op  PALPATION:  deferred   TODAY'S TREATMENT:   [facility] Adult PT Treatment:                                                DATE: 12/05/23 Therapeutic Exercise: Nustep L2 6 min B ball roll flexion on wall 15x Standing shoulder extension YTB 15x Standing shoulder flexion YTB 15x Supine flexion with UERanger 30x Supine press with UERanger 30x S/L abduction elbow extended 30x S/L shoulder flexion elbow extended 30x   [facility] Adult PT Treatment:                                                DATE: 12/01/23 Therapeutic Exercise: OH pulleys 6 min Seated B abd in scapular plane, short lever arm 30x - cues to decrease shoulder shrug Seated rows YTB 15x2 Supine press with stick 3x10 Supine flexion with stick 3x10 Sidelie ABD 3x10 (elbow flexed - verbal and tactile cues to decrease shrug) R sidelie shoulder flexion 30x Manual Therapy: PROM/STM and pec release/mobs 30d ER, 90d ABD, 110 FF   [facility] Adult PT Treatment:                                                DATE: 11/21/23 Therapeutic Exercise: OH pulleys 6 min Supine press with stick 3x10 Supine press with stick 3x10 Sidelie ABD 3x10 (elbow flexed) R sidelie shoulder flexion 30x Seated B abd in scapular plane, short lever arm 30x Seated rows YTB 15x2 Manual Therapy: PROM/STM and pec release/mobs 30d ER, 90d ABD, 110 FF   [facility] Adult PT Treatment:                                                 DATE: 11/16/23 Therapeutic Exercise: OH pulleys 6 min Supine press with stick 3x10 Supine press with stick 3x10 Sidelie ABD 3x10 (elbow flexed) Manual Therapy: PROM/STM and pec release/mobs 28d ER, 85d ABD, 100 FF  PATIENT EDUCATION: Education details: Discussed eval findings, rehab rationale and POC and patient is in agreement  Person educated: Patient Education method: Explanation Education comprehension: verbalized understanding and needs further education  HOME EXERCISE PROGRAM: Access Code: [facility] URL: [email or web address] Date: 12/05/2023 Prepared by: Reyes Kohut  Exercises - Seated Shoulder Shrugs  - 5 x daily - 7 x weekly - 10 reps - Seated Scapular Retraction  - 5 x daily - 7 x weekly - 10 reps - Supine Shoulder Flexion with Dowel  - 2 x daily - 5 x weekly - 3 sets - 10 reps - 2s hold - Supine Shoulder Press with Dowel  - 2 x daily - 5 x weekly - 3 sets - 10 reps - 3s hold - Sidelying Shoulder Abduction Palm Forward  - 2 x daily - 5 x weekly - 3 sets - 10 reps - 3s hold - Standing Single Arm Shoulder Flexion with Posterior Anchored Resistance  - 2 x daily - 5 x weekly - 3 sets - 10 reps - Single Arm Shoulder Extension with Anchored Resistance  - 2 x daily - 5 x weekly - 3 sets - 10 reps  ASSESSMENT:  CLINICAL IMPRESSION: Much improved AROM and strength today.  More fluid movement through available AROM observed.  Able to move LUE through motions with elbow extended, increasing lever arm and subsequent resistance.  Updated HEP to reflect progress and ongoing restrictions.   Patient is a 73 y.o. female who was seen today for physical therapy evaluation and treatment for L shoulder weakness, pain and loss of motion following RTSA. Patient arrived for session holding purse in L hand and advised to avoid in future.  Patient demonstrates decreased L shoulder A/PROM along with strength deficits following  surgical procedure.  Patient is good candidate for OPPT.  OBJECTIVE IMPAIRMENTS: decreased activity tolerance, decreased knowledge of condition, decreased mobility, decreased ROM, decreased strength, impaired perceived functional ability, impaired UE functional use, and pain.   ACTIVITY LIMITATIONS: carrying, lifting, sleeping, bed mobility, and dressing  PERSONAL FACTORS: Age, Fitness, Past/current experiences, Time since onset of injury/illness/exacerbation, and 1-2 comorbidities: Asthma CHF  are also affecting patient's functional outcome.   REHAB POTENTIAL: Good  CLINICAL DECISION MAKING: Stable/uncomplicated  EVALUATION COMPLEXITY: Low   GOALS: Goals reviewed with patient? No  SHORT TERM GOALS: Target date: 11/16/2023    Patient to demonstrate independence in HEP  Baseline: [id number] Goal status: Met  2.  Increase PROM to 90d flexion and scaption Baseline:  PROM Right eval Left eval L 11/16/23  Shoulder flexion  60d 110d  Shoulder extension  30d   Shoulder scaption   60d 85d  Shoulder abduction   85d  Shoulder internal rotation     Shoulder external rotation   28d   Goal status: Progressing  3.  6/10 worst pain level Baseline: 9/10 Goal status: Progressing   LONG TERM GOALS: Target date: 12/14/2023    Patient to demonstrate independence in HEP  Baseline: [id number] Goal status: INITIAL  2.  Patient will score at least 59% on FOTO to signify clinically meaningful improvement in functional abilities.   Baseline: 38% Goal status: INITIAL  3.  Patient will acknowledge 4/10 pain at least once during episode of care   Baseline: 9/10 Goal status: INITIAL  4.  Patient will be able to reach [facility] to allow more participation in ADLs Baseline:  Goal status: INITIAL   PLAN:  PT FREQUENCY: 2x/week  PT DURATION: 6 weeks  PLANNED INTERVENTIONS: 02835- PT Re-evaluation,  97110-Therapeutic exercises, 97530- Therapeutic activity, [id number]- Neuromuscular re-education,  97535- Self Care, 02859- Manual therapy, Patient/Family education, and DME instructions  PLAN FOR NEXT SESSION: HEP review and update, manual techniques as appropriate, aerobic tasks, ROM and flexibility activities, strengthening and PREs, TPDN, gait and balance training as needed    Date of referral: 08/17/23 Referring provider: Genelle Standing, MD Referring diagnosis? M19.012 (ICD-10-CM) - Primary osteoarthritis, left shoulder Treatment diagnosis? (if different than referring diagnosis) M19.012 (ICD-10-CM) - Primary osteoarthritis, left shoulder  What was this (referring dx) caused by? Arthritis  Nature of Condition: Chronic (continuous duration > 3 months)   Laterality: Lt  Current Functional Measure Score: FOTO 38%  Objective measurements identify impairments when they are compared to normal values, the uninvolved extremity, and prior level of function.  [x]  Yes  []  No  Objective assessment of functional ability: Severe functional limitations   Briefly describe symptoms: L shoulder pain and soreness restricting ROM and function  How did symptoms start: degenerative condition  Average pain intensity:  Last 24 hours: 9  Past week: 9  How often does the pt experience symptoms? Frequently  How much have the symptoms interfered with usual daily activities? Extremely  How has condition changed since care began at this facility? NA - initial visit  In general, how is the patients overall health? Fair   BACK PAIN (STarT Back Screening Tool) No   Reyes CHRISTELLA Kohut, PT 12/05/2023, 11:04 AM   REVERSE SHOULDER ARTHROPLASTY REHABILITATION GUIDELINES These guidelines do NOT apply to RTSA for proximal humeral fracture - rehabilitation following PHFx will commence 4-6 weeks after surgery as deemed appropriate to protect tuberosity healing PHASE PRECAUTIONS AND GUIDELINES GOALS EXERCISES CRITERIA TO ADVANCE  EXAMINATION  1 (Post-operative day 1 to post-operative week 3) Sling 24/7  (remove for grooming and home exercise program 3-5x/day)  Avoid combined IR/EXT/ADD (hand behind the back) and IR/ADD (reaching across chest) for dislocation precautions  Pillow behind the upper arm while reclining with sling on   Patient should always be able to see the elbow  Avoid WBing - discuss WBing need with physician and PT  No submersion in water until after 4 weeks  Ice after HEP as needed  Maintain integrity of joint replacement; protect soft tissue healing  ROM for elevation to 130 and ER to 30   Optimize distal UE circulation and muscle activity (elbow, wrist and hand)  Instruct in use of sling for proper fit  Educate regarding signs/symptoms of infection    Active elbow, wrist and hand  Pendulum  Scapular retraction with arms resting in neutral position  Forward elevation in scapular plane to 130 deg max motion (table slides, step backs, supine well arm assisted)  ER in scapular plane to 30 deg (seated or supine)  ROM within precautionary range limits may be active or passive   Pain less than 3/10 with ROM  Healing incision without signs of infection  Clearance by surgeon to advance after 2 week post-operative visit Wound assessment  Swelling assessment of upper extremity  Neurovascular assessment of upper extremity  Sling fit and ability to don/doff properly  Patient reported outcome measure  Pain level  Range of motion for elevation and ER   PHASE PRECAUTIONS AND GUIDELINES GOALS EXERCISES CRITERIA TO ADVANCE  EXAMINATION  2 (Post-operative week 3 to 6) May discontinue sling use at 3 weeks; after 2 weeks can remove the sling at home and just use the sling at night and in community for 3rd week  May  use arm for basic activities of daily living (such as feeding, brushing teeth, dressing.)   May submerge in water after 4 weeks  Avoid combined IR/EXT/ADD (hand behind the back) and IR/ADD (reaching across chest) for dislocation  precautions  Avoid acromial or scapular spine pain as increase deltoid loading - decrease load if this occurs  Elevation to 130 deg and ER to 30 deg - passive, active assisted or active  Low (less than 3/10) to no pain  Ability to fire all heads of the deltoid May discontinue grip, and active elbow and wrist exercises since using the arm in ADLs with sling removed around the home  Continue elevation to 130 and ER to 30, both in scapular plane   Submaximal isometrics (pain-free effort) for all functional heads of deltoid (anterior, posterior, middle).   Active exercise as able:  Supine forward punch  Place in balanced position with circumduction and progressive arcs in sagittal plane  Side-lying abduction to 90   Lateral raise with bent elbow   Prone extension to hip  Elevation in scapular plane to 130; ER in scapular plane to 30   Ability to fire isometrically all heads of the deltoid muscle without pain  Ability to place and hold the arm in balanced position (90 deg elevation in supine) Wound assessment  Neurovascular assessment  Swelling assessment  ROM shoulder elevation and ER(0)  Patient reported outcome measure  Pain level   PHASE PRECAUTIONS AND GUIDELINES GOALS EXERCISES CRITERIA TO ADVANCE  EXAMINATION  3 (Post-operative week 6 to 12) Avoid forceful end-range motion in any direction   Progress active use of the arm in ADLs without being restricted to arm by the side of the body;   No heavy lifting or carrying  Initiate functional IR behind the back gently without forceful overpressure  Avoid acromial or scapular spine pain as increase deltoid loading - decrease load if this occurs  NO UPPER BODY ERGOMETER  Optimize ROM for elevation and ER in scapular plane   Expected PROM: Elevation - 145-160; ER - 40-50 ; functional IR to L1  Recover AROM to approach as close to PROM available as possible  Establish dynamic stability of the shoulder  Forward  elevation in scapular plane active progression: supine to incline to vertical; short to long lever arm  Lateral raise with bent elbow; side-lying abduction  Active ER/IR with arm at side  Scapular retraction with light band resistance  Serratus anterior punches in supine; avoid wall, incline or prone press-ups for serratus anterior  Functional IR with hand slide up back - very gentle and gradual   AROM equals/approaches PROM with good mechanics for elevation  No pain  Higher level demand on shoulder than ADL functions PROM for elevation, ER(0)  AROM for elevation, ER(0) and functional IR  Patient reported outcome measure  Pain             PHASE PRECAUTIONS AND GUIDELINES GOALS EXERCISES CRITERIA TO ADVANCE  EXAMINATION  4 (Post-operative week 12+) No heavy lifting and no overhead sports  Weight lifting limit 25.lb  No heavy pushing activity  Gradually increase strength   NO UPPER BODY ERGOMETER  Optimize functional use of operative UE to patient specific goals  Gradual increase in deltoid, scapular muscle and rotator cuff strength  Pain-free functional activities Light hand weights for deltoid up to and not to exceed 3 lbs for anterior and posterior with long arm lift against gravity; elbow bent to 90 deg for abduction in scapular  plane  Band progression for extension to hip with scapular depression/retraction  Band progression for serratus anterior punches in supine; avoid wall, incline or prone press-ups for serratus anterior  End-range stretching gently without forceful overpressure in all planes (elevation in scapular plane, ER in scapular plane, functional IR) with stretching done for life as part of daily routine  NO UPPER BODY ERGOMETER  Pain-free AROM for shoulder elevation (expect around 135-150 deg)  Functional strength for all ADLs, work tasks, and hobbies approved by [employer] with home maintenance program PROM for elevation, ER(0);  ER(90)  AROM for elevation, ER(0) and functional IR  Scapulohumeral rhythm/biomechanics of active movement strategies  Strength testing for deltoid, RTC, scapular muscles  Patient reported outcome measure  Pain

## 2023-12-05 ENCOUNTER — Ambulatory Visit: Payer: 59

## 2023-12-05 DIAGNOSIS — Z96612 Presence of left artificial shoulder joint: Secondary | ICD-10-CM | POA: Diagnosis not present

## 2023-12-05 DIAGNOSIS — G8929 Other chronic pain: Secondary | ICD-10-CM | POA: Diagnosis not present

## 2023-12-05 DIAGNOSIS — M25512 Pain in left shoulder: Secondary | ICD-10-CM | POA: Diagnosis not present

## 2023-12-05 DIAGNOSIS — M6281 Muscle weakness (generalized): Secondary | ICD-10-CM | POA: Diagnosis not present

## 2023-12-07 NOTE — Therapy (Signed)
 OUTPATIENT PHYSICAL THERAPY TREATMENT NOTE   Patient Name: [name] MRN: [id number] DOB:07/18/1951, 73 y.o., female Today's Date: 12/08/2023  END OF SESSION:  PT End of Session - 12/08/23 1135     Visit Number 10    Number of Visits 16    Date for PT Re-Evaluation 12/19/23    Authorization Type [employer] MCR    PT Start Time 1130    PT Stop Time 1215    PT Time Calculation (min) 45 min    Activity Tolerance Patient limited by pain;Patient tolerated treatment well    Behavior During Therapy [facility] for tasks assessed/performed               Past Medical History:  Diagnosis Date   Anemia    Arthritis    Asthma    Blood transfusion without reported diagnosis    Patients believes 2015 when she overdosed on medications    Breast cancer of lower-outer quadrant of right female breast (HCC) 02/13/2015   Cataract    CHF, acute (HCC) 05/03/2012   Depression    Diabetes mellitus    type 2   Eczema    Fatty liver    Fatty infiltration of liver noted on 03/2012 CT scan   Fibromyalgia    Glaucoma    bilateral   Heart disease    History of kidney stones    w/ hx of hydronephrosis - followed by [facility]   HIV nonspecific serology    2006: indeterminate HIV blood test, seen by ID, felt secondary to cross reacting antibodies with no further workup felt necessary at that time    Hyperlipidemia    Hypertension    Obesity    Personal history of radiation therapy 2016   right   Radiation 05/07/15-06/23/15   Right Breast   Past Surgical History:  Procedure Laterality Date   BREAST BIOPSY Left 02/10/2015   malignant    BREAST LUMPECTOMY Right 03/21/2015   CHOLECYSTECTOMY  2003   CYSTOSCOPY W/ LITHOLAPAXY / [facility]     LEFT AND RIGHT HEART CATHETERIZATION WITH CORONARY ANGIOGRAM N/A 05/02/2012   Procedure: LEFT AND RIGHT HEART CATHETERIZATION WITH CORONARY ANGIOGRAM;  Surgeon: Lonni JONETTA Cash, MD;  Location: [facility] CATH LAB;  Service: Cardiovascular;  Laterality: N/A;    RADIOACTIVE SEED GUIDED PARTIAL MASTECTOMY WITH AXILLARY SENTINEL LYMPH NODE BIOPSY Right 03/21/2015   Procedure: RIGHT  PARTIAL MASTECTOMY WITH RADIOACTIVE SEED LOCALIZATION RIGHT  AXILLARY SENTINEL LYMPH NODE BIOPSY;  Surgeon: Elon Pacini, MD;  Location: [facility];  Service: General;  Laterality: Right;   REPLACEMENT TOTAL KNEE BILATERAL  2005 &2006   REVERSE SHOULDER ARTHROPLASTY Left 09/27/2023   Procedure: LEFT REVERSE SHOULDER ARTHROPLASTY;  Surgeon: Genelle Standing, MD;  Location: [facility] OR;  Service: Orthopedics;  Laterality: Left;   VASCULAR SURGERY Right 03/15/2013   Ultrasound guided sclerotherapy   Patient Active Problem List   Diagnosis Date Noted   Primary osteoarthritis, left shoulder 09/27/2023   Renal disease due to diabetes mellitus (HCC) 04/28/2023   Left shoulder pain 09/06/2022   Tinea pedis 04/09/2020   Spinal stenosis of lumbar region without neurogenic claudication 02/08/2019   Gout 05/16/2018   Gait instability 01/03/2018   Tremor of both hands    Stuttering    Essential hypertension    Hyperlipidemia 08/13/2015   Breast cancer of lower-outer quadrant of right female breast (HCC) 02/13/2015   Type 2 diabetes mellitus with diabetic foot deformity (HCC) 01/01/2014   Chronic combined systolic and  diastolic heart failure (HCC) 02/26/2013   OBESITY 02/05/2009   Asthma, intermittent 02/08/2008   Neuropathy due to secondary diabetes (HCC) 05/18/2007   NEPHROLITHIASIS 01/26/2007   DJD (degenerative joint disease), multiple sites 01/26/2007    PCP: Jeanelle Layman CROME, MD   REFERRING PROVIDER: Genelle Standing, MD  REFERRING DIAG: [phone] (ICD-10-CM) - Primary osteoarthritis, left shoulder  THERAPY DIAG:  Chronic left shoulder pain  Muscle weakness (generalized)  Status post reverse total replacement of left shoulder  Rationale for Evaluation and Treatment: Rehabilitation  ONSET DATE: 09/22/23  Next MD appt: 12/23/23 Dr.  Genelle  SUBJECTIVE:                                                                                                                                                                                      SUBJECTIVE STATEMENT: No change in pain levels.  Cold weather increases discomfort.  She does feel she can abduct her arm away from her side a bit better.  Hand dominance: Right  PERTINENT HISTORY: 2-week status post reverse shoulder arthroplasty without complication. This time I would like her to work particular with outpatient physical therapy to work on regaining her forward flexion and abduction as this is quite limited. She will continue to work on this. I will plan to see him back in 4 weeks for reassessment  PAIN:  Are you having pain? Yes: NPRS scale: 9/10 Pain location: L shoulder Pain description: ache Aggravating factors: activity and weightbearing posiitons Relieving factors: rest and meds    PRECAUTIONS: Shoulder  RED FLAGS: None   WEIGHT BEARING RESTRICTIONS: No  FALLS:  Has patient fallen in last 6 months? No  OCCUPATION: retired  PLOF: Independent with basic ADLs  PATIENT GOALS:To move my shoulder again  NEXT MD VISIT:   OBJECTIVE:  Note: Objective measures were completed at Evaluation unless otherwise noted.  DIAGNOSTIC FINDINGS:  none  PATIENT SURVEYS:  FOTO 38(59 predicted)  12/08/23 49%  POSTURE: Forward and rounded shoulders   UPPER EXTREMITY ROM:   PROM Right eval Left eval L 11/16/23 L 12/01/23  Shoulder flexion  60d 110d 110d  Shoulder extension  30d    Shoulder scaption   60d 85d   Shoulder abduction   85d   Shoulder internal rotation      Shoulder external rotation   28d   Elbow flexion  WFL    Elbow extension  WFL    Wrist flexion      Wrist extension      Wrist ulnar deviation      Wrist radial deviation      Wrist pronation      Wrist supination      (  Blank rows = not tested)  UPPER EXTREMITY MMT: Deferred post-op  MMT  Right eval Left eval  Shoulder flexion    Shoulder extension    Shoulder abduction    Shoulder adduction    Shoulder internal rotation    Shoulder external rotation    Middle trapezius    Lower trapezius    Elbow flexion    Elbow extension    Wrist flexion    Wrist extension    Wrist ulnar deviation    Wrist radial deviation    Wrist pronation    Wrist supination    Grip strength (lbs)    (Blank rows = not tested)  SHOULDER SPECIAL TESTS: Deferred post-op  JOINT MOBILITY TESTING:  Deferred post op  PALPATION:  deferred   TODAY'S TREATMENT:   [facility] Adult PT Treatment:                                                DATE: 12/08/23 Therapeutic Exercise: Nustep L3 6 min Flexion on wall with towel 15x Scaption on wall with towel 15x Seated UE Ranger scaption 15x Seated UE Ranger flexion 15x Manual Therapy: PROM/STM and pec release/mobs 30d ER, 90d ABD, 110 FF  [facility] Adult PT Treatment:                                                DATE: 12/05/23 Therapeutic Exercise: Nustep L2 6 min B ball roll flexion on wall 15x Standing shoulder extension YTB 15x Standing shoulder flexion YTB 15x Supine flexion with UERanger 30x Supine press with UERanger 30x S/L abduction elbow extended 30x S/L shoulder flexion elbow extended 30x   [facility] Adult PT Treatment:                                                DATE: 12/01/23 Therapeutic Exercise: OH pulleys 6 min Seated B abd in scapular plane, short lever arm 30x - cues to decrease shoulder shrug Seated rows YTB 15x2 Supine press with stick 3x10 Supine flexion with stick 3x10 Sidelie ABD 3x10 (elbow flexed - verbal and tactile cues to decrease shrug) R sidelie shoulder flexion 30x Manual Therapy: PROM/STM and pec release/mobs 30d ER, 90d ABD, 110 FF   [facility] Adult PT Treatment:                                                DATE: 11/21/23 Therapeutic Exercise: OH pulleys 6 min Supine press with stick 3x10 Supine press with stick  3x10 Sidelie ABD 3x10 (elbow flexed) R sidelie shoulder flexion 30x Seated B abd in scapular plane, short lever arm 30x Seated rows YTB 15x2 Manual Therapy: PROM/STM and pec release/mobs 30d ER, 90d ABD, 110 FF   [facility] Adult PT Treatment:  DATE: 11/16/23 Therapeutic Exercise: OH pulleys 6 min Supine press with stick 3x10 Supine press with stick 3x10 Sidelie ABD 3x10 (elbow flexed) Manual Therapy: PROM/STM and pec release/mobs 28d ER, 85d ABD, 100 FF                                                    PATIENT EDUCATION: Education details: Discussed eval findings, rehab rationale and POC and patient is in agreement  Person educated: Patient Education method: Explanation Education comprehension: verbalized understanding and needs further education  HOME EXERCISE PROGRAM: Access Code: [facility] URL: [email or web address] Date: 12/05/2023 Prepared by: Reyes Kohut  Exercises - Seated Shoulder Shrugs  - 5 x daily - 7 x weekly - 10 reps - Seated Scapular Retraction  - 5 x daily - 7 x weekly - 10 reps - Supine Shoulder Flexion with Dowel  - 2 x daily - 5 x weekly - 3 sets - 10 reps - 2s hold - Supine Shoulder Press with Dowel  - 2 x daily - 5 x weekly - 3 sets - 10 reps - 3s hold - Sidelying Shoulder Abduction Palm Forward  - 2 x daily - 5 x weekly - 3 sets - 10 reps - 3s hold - Standing Single Arm Shoulder Flexion with Posterior Anchored Resistance  - 2 x daily - 5 x weekly - 3 sets - 10 reps - Single Arm Shoulder Extension with Anchored Resistance  - 2 x daily - 5 x weekly - 3 sets - 10 reps  ASSESSMENT:  CLINICAL IMPRESSION: FOTO score increased to 49%.  Added more AAROM exercises and tasks today.  No change in PROM measured but improved AROM observed within available ROM.   Patient is a 73 y.o. female who was seen today for physical therapy evaluation and treatment for L shoulder weakness, pain and loss of motion  following RTSA. Patient arrived for session holding purse in L hand and advised to avoid in future.  Patient demonstrates decreased L shoulder A/PROM along with strength deficits following surgical procedure.  Patient is good candidate for OPPT.  OBJECTIVE IMPAIRMENTS: decreased activity tolerance, decreased knowledge of condition, decreased mobility, decreased ROM, decreased strength, impaired perceived functional ability, impaired UE functional use, and pain.   ACTIVITY LIMITATIONS: carrying, lifting, sleeping, bed mobility, and dressing  PERSONAL FACTORS: Age, Fitness, Past/current experiences, Time since onset of injury/illness/exacerbation, and 1-2 comorbidities: Asthma CHF  are also affecting patient's functional outcome.   REHAB POTENTIAL: Good  CLINICAL DECISION MAKING: Stable/uncomplicated  EVALUATION COMPLEXITY: Low   GOALS: Goals reviewed with patient? No  SHORT TERM GOALS: Target date: 11/16/2023    Patient to demonstrate independence in HEP  Baseline: [id number] Goal status: Met  2.  Increase PROM to 90d flexion and scaption Baseline:  PROM Right eval Left eval L 11/16/23  Shoulder flexion  60d 110d  Shoulder extension  30d   Shoulder scaption   60d 85d  Shoulder abduction   85d  Shoulder internal rotation     Shoulder external rotation   28d   Goal status: Progressing  3.  6/10 worst pain level Baseline: 9/10; 12/08/23 7/10 Goal status: Progressing   LONG TERM GOALS: Target date: 12/14/2023    Patient to demonstrate independence in HEP  Baseline: [id number] Goal status: INITIAL  2.  Patient will score at least 59% on FOTO to  signify clinically meaningful improvement in functional abilities.   Baseline: 38%; 12/08/23 49% Goal status: Ongoing  3.  Patient will acknowledge 4/10 pain at least once during episode of care   Baseline: 9/10; 12/08/23 7/10 Goal status: Ongoing  4.  Patient will be able to reach [employer] to allow more participation in ADLs Baseline:  AROM limited to below shoulder height.:12/08/23 FF to 100d Goal status: INITIAL   PLAN:  PT FREQUENCY: 2x/week  PT DURATION: 6 weeks  PLANNED INTERVENTIONS: 97164- PT Re-evaluation, 97110-Therapeutic exercises, 97530- Therapeutic activity, 97112- Neuromuscular re-education, 97535- Self Care, [id number]- Manual therapy, Patient/Family education, and DME instructions  PLAN FOR NEXT SESSION: HEP review and update, manual techniques as appropriate, aerobic tasks, ROM and flexibility activities, strengthening and PREs, TPDN, gait and balance training as needed    Date of referral: 08/17/23 Referring provider: Genelle Standing, MD Referring diagnosis? M19.012 (ICD-10-CM) - Primary osteoarthritis, left shoulder Treatment diagnosis? (if different than referring diagnosis) M19.012 (ICD-10-CM) - Primary osteoarthritis, left shoulder  What was this (referring dx) caused by? Arthritis  Nature of Condition: Chronic (continuous duration > 3 months)   Laterality: Lt  Current Functional Measure Score: FOTO 38%  Objective measurements identify impairments when they are compared to normal values, the uninvolved extremity, and prior level of function.  [x]  Yes  []  No  Objective assessment of functional ability: Severe functional limitations   Briefly describe symptoms: L shoulder pain and soreness restricting ROM and function  How did symptoms start: degenerative condition  Average pain intensity:  Last 24 hours: 9  Past week: 9  How often does the pt experience symptoms? Frequently  How much have the symptoms interfered with usual daily activities? Extremely  How has condition changed since care began at this facility? NA - initial visit  In general, how is the patients overall health? Fair   BACK PAIN (STarT Back Screening Tool) No   Reyes CHRISTELLA Kohut, PT 12/08/2023, 12:15 PM   REVERSE SHOULDER ARTHROPLASTY REHABILITATION GUIDELINES These guidelines do NOT apply to RTSA for proximal  humeral fracture - rehabilitation following PHFx will commence 4-6 weeks after surgery as deemed appropriate to protect tuberosity healing PHASE PRECAUTIONS AND GUIDELINES GOALS EXERCISES CRITERIA TO ADVANCE  EXAMINATION  1 (Post-operative day 1 to post-operative week 3) Sling 24/7 (remove for grooming and home exercise program 3-5x/day)  Avoid combined IR/EXT/ADD (hand behind the back) and IR/ADD (reaching across chest) for dislocation precautions  Pillow behind the upper arm while reclining with sling on   Patient should always be able to see the elbow  Avoid WBing - discuss WBing need with physician and PT  No submersion in water until after 4 weeks  Ice after HEP as needed  Maintain integrity of joint replacement; protect soft tissue healing  ROM for elevation to 130 and ER to 30   Optimize distal UE circulation and muscle activity (elbow, wrist and hand)  Instruct in use of sling for proper fit  Educate regarding signs/symptoms of infection    Active elbow, wrist and hand  Pendulum  Scapular retraction with arms resting in neutral position  Forward elevation in scapular plane to 130 deg max motion (table slides, step backs, supine well arm assisted)  ER in scapular plane to 30 deg (seated or supine)  ROM within precautionary range limits may be active or passive   Pain less than 3/10 with ROM  Healing incision without signs of infection  Clearance by surgeon to advance after 2 week post-operative visit Wound assessment  Swelling assessment of upper extremity  Neurovascular assessment of upper extremity  Sling fit and ability to don/doff properly  Patient reported outcome measure  Pain level  Range of motion for elevation and ER   PHASE PRECAUTIONS AND GUIDELINES GOALS EXERCISES CRITERIA TO ADVANCE  EXAMINATION  2 (Post-operative week 3 to 6) May discontinue sling use at 3 weeks; after 2 weeks can remove the sling at home and just use the sling at night  and in community for 3rd week  May use arm for basic activities of daily living (such as feeding, brushing teeth, dressing.)   May submerge in water after 4 weeks  Avoid combined IR/EXT/ADD (hand behind the back) and IR/ADD (reaching across chest) for dislocation precautions  Avoid acromial or scapular spine pain as increase deltoid loading - decrease load if this occurs  Elevation to 130 deg and ER to 30 deg - passive, active assisted or active  Low (less than 3/10) to no pain  Ability to fire all heads of the deltoid May discontinue grip, and active elbow and wrist exercises since using the arm in ADLs with sling removed around the home  Continue elevation to 130 and ER to 30, both in scapular plane   Submaximal isometrics (pain-free effort) for all functional heads of deltoid (anterior, posterior, middle).   Active exercise as able:  Supine forward punch  Place in balanced position with circumduction and progressive arcs in sagittal plane  Side-lying abduction to 90   Lateral raise with bent elbow   Prone extension to hip  Elevation in scapular plane to 130; ER in scapular plane to 30   Ability to fire isometrically all heads of the deltoid muscle without pain  Ability to place and hold the arm in balanced position (90 deg elevation in supine) Wound assessment  Neurovascular assessment  Swelling assessment  ROM shoulder elevation and ER(0)  Patient reported outcome measure  Pain level   PHASE PRECAUTIONS AND GUIDELINES GOALS EXERCISES CRITERIA TO ADVANCE  EXAMINATION  3 (Post-operative week 6 to 12) Avoid forceful end-range motion in any direction   Progress active use of the arm in ADLs without being restricted to arm by the side of the body;   No heavy lifting or carrying  Initiate functional IR behind the back gently without forceful overpressure  Avoid acromial or scapular spine pain as increase deltoid loading - decrease load if this occurs  NO UPPER  BODY ERGOMETER  Optimize ROM for elevation and ER in scapular plane   Expected PROM: Elevation - 145-160; ER - 40-50 ; functional IR to L1  Recover AROM to approach as close to PROM available as possible  Establish dynamic stability of the shoulder  Forward elevation in scapular plane active progression: supine to incline to vertical; short to long lever arm  Lateral raise with bent elbow; side-lying abduction  Active ER/IR with arm at side  Scapular retraction with light band resistance  Serratus anterior punches in supine; avoid wall, incline or prone press-ups for serratus anterior  Functional IR with hand slide up back - very gentle and gradual   AROM equals/approaches PROM with good mechanics for elevation  No pain  Higher level demand on shoulder than ADL functions PROM for elevation, ER(0)  AROM for elevation, ER(0) and functional IR  Patient reported outcome measure  Pain             PHASE PRECAUTIONS AND GUIDELINES GOALS EXERCISES CRITERIA TO ADVANCE  EXAMINATION  4 (Post-operative week  12+) No heavy lifting and no overhead sports  Weight lifting limit 25.lb  No heavy pushing activity  Gradually increase strength   NO UPPER BODY ERGOMETER  Optimize functional use of operative UE to patient specific goals  Gradual increase in deltoid, scapular muscle and rotator cuff strength  Pain-free functional activities Light hand weights for deltoid up to and not to exceed 3 lbs for anterior and posterior with long arm lift against gravity; elbow bent to 90 deg for abduction in scapular plane  Band progression for extension to hip with scapular depression/retraction  Band progression for serratus anterior punches in supine; avoid wall, incline or prone press-ups for serratus anterior  End-range stretching gently without forceful overpressure in all planes (elevation in scapular plane, ER in scapular plane, functional IR) with stretching done for life as part of  daily routine  NO UPPER BODY ERGOMETER  Pain-free AROM for shoulder elevation (expect around 135-150 deg)  Functional strength for all ADLs, work tasks, and hobbies approved by [employer] with home maintenance program PROM for elevation, ER(0); ER(90)  AROM for elevation, ER(0) and functional IR  Scapulohumeral rhythm/biomechanics of active movement strategies  Strength testing for deltoid, RTC, scapular muscles  Patient reported outcome measure  Pain

## 2023-12-08 ENCOUNTER — Ambulatory Visit: Payer: 59

## 2023-12-08 DIAGNOSIS — M6281 Muscle weakness (generalized): Secondary | ICD-10-CM

## 2023-12-08 DIAGNOSIS — Z96612 Presence of left artificial shoulder joint: Secondary | ICD-10-CM

## 2023-12-08 DIAGNOSIS — G8929 Other chronic pain: Secondary | ICD-10-CM

## 2023-12-08 DIAGNOSIS — M25512 Pain in left shoulder: Secondary | ICD-10-CM | POA: Diagnosis not present

## 2023-12-12 NOTE — Therapy (Signed)
 OUTPATIENT PHYSICAL THERAPY TREATMENT NOTE   Patient Name: [name] MRN: [id number] DOB:1951-02-12, 73 y.o., female Today's Date: 12/13/2023  END OF SESSION:  PT End of Session - 12/13/23 1137     Visit Number 11    Number of Visits 16    Date for PT Re-Evaluation 12/19/23    Authorization Type [employer] MCR    PT Start Time 1130    PT Stop Time 1210    PT Time Calculation (min) 40 min    Activity Tolerance Patient limited by pain;Patient tolerated treatment well    Behavior During Therapy [facility] for tasks assessed/performed                Past Medical History:  Diagnosis Date   Anemia    Arthritis    Asthma    Blood transfusion without reported diagnosis    Patients believes 2015 when she overdosed on medications    Breast cancer of lower-outer quadrant of right female breast (HCC) 02/13/2015   Cataract    CHF, acute (HCC) 05/03/2012   Depression    Diabetes mellitus    type 2   Eczema    Fatty liver    Fatty infiltration of liver noted on 03/2012 CT scan   Fibromyalgia    Glaucoma    bilateral   Heart disease    History of kidney stones    w/ hx of hydronephrosis - followed by [facility]   HIV nonspecific serology    2006: indeterminate HIV blood test, seen by ID, felt secondary to cross reacting antibodies with no further workup felt necessary at that time    Hyperlipidemia    Hypertension    Obesity    Personal history of radiation therapy 2016   right   Radiation 05/07/15-06/23/15   Right Breast   Past Surgical History:  Procedure Laterality Date   BREAST BIOPSY Left 02/10/2015   malignant    BREAST LUMPECTOMY Right 03/21/2015   CHOLECYSTECTOMY  2003   CYSTOSCOPY W/ LITHOLAPAXY / [facility]     LEFT AND RIGHT HEART CATHETERIZATION WITH CORONARY ANGIOGRAM N/A 05/02/2012   Procedure: LEFT AND RIGHT HEART CATHETERIZATION WITH CORONARY ANGIOGRAM;  Surgeon: Lonni JONETTA Cash, MD;  Location: [facility] CATH LAB;  Service: Cardiovascular;  Laterality: N/A;    RADIOACTIVE SEED GUIDED PARTIAL MASTECTOMY WITH AXILLARY SENTINEL LYMPH NODE BIOPSY Right 03/21/2015   Procedure: RIGHT  PARTIAL MASTECTOMY WITH RADIOACTIVE SEED LOCALIZATION RIGHT  AXILLARY SENTINEL LYMPH NODE BIOPSY;  Surgeon: Elon Pacini, MD;  Location: [facility];  Service: General;  Laterality: Right;   REPLACEMENT TOTAL KNEE BILATERAL  2005 &2006   REVERSE SHOULDER ARTHROPLASTY Left 09/27/2023   Procedure: LEFT REVERSE SHOULDER ARTHROPLASTY;  Surgeon: Genelle Standing, MD;  Location: [facility] OR;  Service: Orthopedics;  Laterality: Left;   VASCULAR SURGERY Right 03/15/2013   Ultrasound guided sclerotherapy   Patient Active Problem List   Diagnosis Date Noted   Primary osteoarthritis, left shoulder 09/27/2023   Renal disease due to diabetes mellitus (HCC) 04/28/2023   Left shoulder pain 09/06/2022   Tinea pedis 04/09/2020   Spinal stenosis of lumbar region without neurogenic claudication 02/08/2019   Gout 05/16/2018   Gait instability 01/03/2018   Tremor of both hands    Stuttering    Essential hypertension    Hyperlipidemia 08/13/2015   Breast cancer of lower-outer quadrant of right female breast (HCC) 02/13/2015   Type 2 diabetes mellitus with diabetic foot deformity (HCC) 01/01/2014   Chronic combined systolic  and diastolic heart failure (HCC) 02/26/2013   OBESITY 02/05/2009   Asthma, intermittent 02/08/2008   Neuropathy due to secondary diabetes (HCC) 05/18/2007   NEPHROLITHIASIS 01/26/2007   DJD (degenerative joint disease), multiple sites 01/26/2007    PCP: Jeanelle Layman CROME, MD   REFERRING PROVIDER: Genelle Standing, MD  REFERRING DIAG: [phone] (ICD-10-CM) - Primary osteoarthritis, left shoulder  THERAPY DIAG:  Chronic left shoulder pain  Muscle weakness (generalized)  Status post reverse total replacement of left shoulder  Rationale for Evaluation and Treatment: Rehabilitation  ONSET DATE: 09/22/23  Next MD appt: 12/23/23 Dr.  Genelle  SUBJECTIVE:                                                                                                                                                                                      SUBJECTIVE STATEMENT: L shoulder remains stiff and sore.  Main limitation is reaching OH into cabinets.  Hand dominance: Right  PERTINENT HISTORY: 2-week status post reverse shoulder arthroplasty without complication. This time I would like her to work particular with outpatient physical therapy to work on regaining her forward flexion and abduction as this is quite limited. She will continue to work on this. I will plan to see him back in 4 weeks for reassessment  PAIN:  Are you having pain? Yes: NPRS scale: 9/10 Pain location: L shoulder Pain description: ache Aggravating factors: activity and weightbearing posiitons Relieving factors: rest and meds    PRECAUTIONS: Shoulder  RED FLAGS: None   WEIGHT BEARING RESTRICTIONS: No  FALLS:  Has patient fallen in last 6 months? No  OCCUPATION: retired  PLOF: Independent with basic ADLs  PATIENT GOALS:To move my shoulder again  NEXT MD VISIT:   OBJECTIVE:  Note: Objective measures were completed at Evaluation unless otherwise noted.  DIAGNOSTIC FINDINGS:  none  PATIENT SURVEYS:  FOTO 38(59 predicted)  12/08/23 49%  POSTURE: Forward and rounded shoulders   UPPER EXTREMITY ROM:   PROM Right eval Left eval L 11/16/23 L 12/01/23  Shoulder flexion  60d 110d 110d  Shoulder extension  30d    Shoulder scaption   60d 85d 100d  Shoulder abduction   85d   Shoulder internal rotation      Shoulder external rotation   28d   Elbow flexion  WFL    Elbow extension  WFL    Wrist flexion      Wrist extension      Wrist ulnar deviation      Wrist radial deviation      Wrist pronation      Wrist supination      (Blank rows = not tested)  UPPER EXTREMITY  MMT: Deferred post-op  MMT Right eval Left eval  Shoulder flexion     Shoulder extension    Shoulder abduction    Shoulder adduction    Shoulder internal rotation    Shoulder external rotation    Middle trapezius    Lower trapezius    Elbow flexion    Elbow extension    Wrist flexion    Wrist extension    Wrist ulnar deviation    Wrist radial deviation    Wrist pronation    Wrist supination    Grip strength (lbs)    (Blank rows = not tested)  SHOULDER SPECIAL TESTS: Deferred post-op  JOINT MOBILITY TESTING:  Deferred post op  PALPATION:  deferred   TODAY'S TREATMENT:   [facility] Adult PT Treatment:                                                DATE: 12/13/23 Therapeutic Exercise: Nustep L3 8 min Supine chest press 500g ball  AROM L flexion in supine 3x5 Supine flexion with UERanger 30x Supine press with UERanger 30x Flexion on wall with towel 15x Scaption on wall with towel 15x Manual Therapy: PROM 30d ER, 80d ABD, 100d FF  [facility] Adult PT Treatment:                                                DATE: 12/08/23 Therapeutic Exercise: Nustep L3 6 min Flexion on wall with towel 15x Scaption on wall with towel 15x Seated UE Ranger scaption 15x Seated UE Ranger flexion 15x Manual Therapy: PROM/STM and pec release/mobs 30d ER, 90d ABD, 110 FF  [facility] Adult PT Treatment:                                                DATE: 12/05/23 Therapeutic Exercise: Nustep L2 6 min B ball roll flexion on wall 15x Standing shoulder extension YTB 15x Standing shoulder flexion YTB 15x Supine flexion with UERanger 30x Supine press with UERanger 30x S/L abduction elbow extended 30x S/L shoulder flexion elbow extended 30x   [facility] Adult PT Treatment:                                                DATE: 12/01/23 Therapeutic Exercise: OH pulleys 6 min Seated B abd in scapular plane, short lever arm 30x - cues to decrease shoulder shrug Seated rows YTB 15x2 Supine press with stick 3x10 Supine flexion with stick 3x10 Sidelie ABD 3x10 (elbow flexed - verbal and  tactile cues to decrease shrug) R sidelie shoulder flexion 30x Manual Therapy: PROM/STM and pec release/mobs 30d ER, 90d ABD, 110 FF   [facility] Adult PT Treatment:                                                DATE:  11/21/23 Therapeutic Exercise: OH pulleys 6 min Supine press with stick 3x10 Supine press with stick 3x10 Sidelie ABD 3x10 (elbow flexed) R sidelie shoulder flexion 30x Seated B abd in scapular plane, short lever arm 30x Seated rows YTB 15x2 Manual Therapy: PROM/STM and pec release/mobs 30d ER, 90d ABD, 110 FF   [facility] Adult PT Treatment:                                                DATE: 11/16/23 Therapeutic Exercise: OH pulleys 6 min Supine press with stick 3x10 Supine press with stick 3x10 Sidelie ABD 3x10 (elbow flexed) Manual Therapy: PROM/STM and pec release/mobs 28d ER, 85d ABD, 100 FF                                                    PATIENT EDUCATION: Education details: Discussed eval findings, rehab rationale and POC and patient is in agreement  Person educated: Patient Education method: Explanation Education comprehension: verbalized understanding and needs further education  HOME EXERCISE PROGRAM: Access Code: [facility] URL: [email or web address] Date: 12/05/2023 Prepared by: Reyes Kohut  Exercises - Seated Shoulder Shrugs  - 5 x daily - 7 x weekly - 10 reps - Seated Scapular Retraction  - 5 x daily - 7 x weekly - 10 reps - Supine Shoulder Flexion with Dowel  - 2 x daily - 5 x weekly - 3 sets - 10 reps - 2s hold - Supine Shoulder Press with Dowel  - 2 x daily - 5 x weekly - 3 sets - 10 reps - 3s hold - Sidelying Shoulder Abduction Palm Forward  - 2 x daily - 5 x weekly - 3 sets - 10 reps - 3s hold - Standing Single Arm Shoulder Flexion with Posterior Anchored Resistance  - 2 x daily - 5 x weekly - 3 sets - 10 reps - Single Arm Shoulder Extension with Anchored Resistance  - 2 x daily - 5 x weekly - 3 sets - 10  reps  ASSESSMENT:  CLINICAL IMPRESSION: Advanced to PRE's using single arm with limited ability to participate against gravity due to weakness and substitution patterns.  PROM values have declined with more guarding noted through available ROM.   Continued global substitution patterns with AROM, unable to accurately discern if deltoid is firing appropriately.   Patient is a 73 y.o. female who was seen today for physical therapy evaluation and treatment for L shoulder weakness, pain and loss of motion following RTSA. Patient arrived for session holding purse in L hand and advised to avoid in future.  Patient demonstrates decreased L shoulder A/PROM along with strength deficits following surgical procedure.  Patient is good candidate for OPPT.  OBJECTIVE IMPAIRMENTS: decreased activity tolerance, decreased knowledge of condition, decreased mobility, decreased ROM, decreased strength, impaired perceived functional ability, impaired UE functional use, and pain.   ACTIVITY LIMITATIONS: carrying, lifting, sleeping, bed mobility, and dressing  PERSONAL FACTORS: Age, Fitness, Past/current experiences, Time since onset of injury/illness/exacerbation, and 1-2 comorbidities: Asthma CHF  are also affecting patient's functional outcome.   REHAB POTENTIAL: Good  CLINICAL DECISION MAKING: Stable/uncomplicated  EVALUATION COMPLEXITY: Low   GOALS: Goals reviewed with patient? No  SHORT TERM GOALS: Target date:  11/16/2023    Patient to demonstrate independence in HEP  Baseline: [id number] Goal status: Met  2.  Increase PROM to 90d flexion and scaption Baseline:  PROM Right eval Left eval L 11/16/23 L 12/01/23  Shoulder flexion  60d 110d 110d  Shoulder extension  30d    Shoulder scaption   60d 85d 100d   Goal status: Met  3.  6/10 worst pain level Baseline: 9/10; 12/08/23 7/10 Goal status: Progressing   LONG TERM GOALS: Target date: 12/14/2023    Patient to demonstrate independence in HEP   Baseline: [id number] Goal status: Met  2.  Patient will score at least 59% on FOTO to signify clinically meaningful improvement in functional abilities.   Baseline: 38%; 12/08/23 49% Goal status: Ongoing  3.  Patient will acknowledge 4/10 pain at least once during episode of care   Baseline: 9/10; 12/08/23 7/10 Goal status: Ongoing  4.  Patient will be able to reach [facility] to allow more participation in ADLs Baseline: AROM limited to below shoulder height.:12/08/23 FF to 100d Goal status: INITIAL   PLAN:  PT FREQUENCY: 2x/week  PT DURATION: 6 weeks  PLANNED INTERVENTIONS: 97164- PT Re-evaluation, 97110-Therapeutic exercises, 97530- Therapeutic activity, 97112- Neuromuscular re-education, 97535- Self Care, [id number]- Manual therapy, Patient/Family education, and DME instructions  PLAN FOR NEXT SESSION: HEP review and update, manual techniques as appropriate, aerobic tasks, ROM and flexibility activities, strengthening and PREs, TPDN, gait and balance training as needed    Date of referral: 08/17/23 Referring provider: Genelle Standing, MD Referring diagnosis? M19.012 (ICD-10-CM) - Primary osteoarthritis, left shoulder Treatment diagnosis? (if different than referring diagnosis) M19.012 (ICD-10-CM) - Primary osteoarthritis, left shoulder  What was this (referring dx) caused by? Arthritis  Nature of Condition: Chronic (continuous duration > 3 months)   Laterality: Lt  Current Functional Measure Score: FOTO 38%  Objective measurements identify impairments when they are compared to normal values, the uninvolved extremity, and prior level of function.  [x]  Yes  []  No  Objective assessment of functional ability: Severe functional limitations   Briefly describe symptoms: L shoulder pain and soreness restricting ROM and function  How did symptoms start: degenerative condition  Average pain intensity:  Last 24 hours: 9  Past week: 9  How often does the pt experience symptoms?  Frequently  How much have the symptoms interfered with usual daily activities? Extremely  How has condition changed since care began at this facility? NA - initial visit  In general, how is the patients overall health? Fair   BACK PAIN (STarT Back Screening Tool) No   Reyes CHRISTELLA Kohut, PT 12/13/2023, 12:18 PM   REVERSE SHOULDER ARTHROPLASTY REHABILITATION GUIDELINES These guidelines do NOT apply to RTSA for proximal humeral fracture - rehabilitation following PHFx will commence 4-6 weeks after surgery as deemed appropriate to protect tuberosity healing PHASE PRECAUTIONS AND GUIDELINES GOALS EXERCISES CRITERIA TO ADVANCE  EXAMINATION  1 (Post-operative day 1 to post-operative week 3) Sling 24/7 (remove for grooming and home exercise program 3-5x/day)  Avoid combined IR/EXT/ADD (hand behind the back) and IR/ADD (reaching across chest) for dislocation precautions  Pillow behind the upper arm while reclining with sling on   Patient should always be able to see the elbow  Avoid WBing - discuss WBing need with physician and PT  No submersion in water until after 4 weeks  Ice after HEP as needed  Maintain integrity of joint replacement; protect soft tissue healing  ROM for elevation to 130 and ER to 30   Optimize  distal UE circulation and muscle activity (elbow, wrist and hand)  Instruct in use of sling for proper fit  Educate regarding signs/symptoms of infection    Active elbow, wrist and hand  Pendulum  Scapular retraction with arms resting in neutral position  Forward elevation in scapular plane to 130 deg max motion (table slides, step backs, supine well arm assisted)  ER in scapular plane to 30 deg (seated or supine)  ROM within precautionary range limits may be active or passive   Pain less than 3/10 with ROM  Healing incision without signs of infection  Clearance by surgeon to advance after 2 week post-operative visit Wound assessment  Swelling assessment  of upper extremity  Neurovascular assessment of upper extremity  Sling fit and ability to don/doff properly  Patient reported outcome measure  Pain level  Range of motion for elevation and ER   PHASE PRECAUTIONS AND GUIDELINES GOALS EXERCISES CRITERIA TO ADVANCE  EXAMINATION  2 (Post-operative week 3 to 6) May discontinue sling use at 3 weeks; after 2 weeks can remove the sling at home and just use the sling at night and in community for 3rd week  May use arm for basic activities of daily living (such as feeding, brushing teeth, dressing.)   May submerge in water after 4 weeks  Avoid combined IR/EXT/ADD (hand behind the back) and IR/ADD (reaching across chest) for dislocation precautions  Avoid acromial or scapular spine pain as increase deltoid loading - decrease load if this occurs  Elevation to 130 deg and ER to 30 deg - passive, active assisted or active  Low (less than 3/10) to no pain  Ability to fire all heads of the deltoid May discontinue grip, and active elbow and wrist exercises since using the arm in ADLs with sling removed around the home  Continue elevation to 130 and ER to 30, both in scapular plane   Submaximal isometrics (pain-free effort) for all functional heads of deltoid (anterior, posterior, middle).   Active exercise as able:  Supine forward punch  Place in balanced position with circumduction and progressive arcs in sagittal plane  Side-lying abduction to 90   Lateral raise with bent elbow   Prone extension to hip  Elevation in scapular plane to 130; ER in scapular plane to 30   Ability to fire isometrically all heads of the deltoid muscle without pain  Ability to place and hold the arm in balanced position (90 deg elevation in supine) Wound assessment  Neurovascular assessment  Swelling assessment  ROM shoulder elevation and ER(0)  Patient reported outcome measure  Pain level   PHASE PRECAUTIONS AND GUIDELINES GOALS EXERCISES CRITERIA  TO ADVANCE  EXAMINATION  3 (Post-operative week 6 to 12) Avoid forceful end-range motion in any direction   Progress active use of the arm in ADLs without being restricted to arm by the side of the body;   No heavy lifting or carrying  Initiate functional IR behind the back gently without forceful overpressure  Avoid acromial or scapular spine pain as increase deltoid loading - decrease load if this occurs  NO UPPER BODY ERGOMETER  Optimize ROM for elevation and ER in scapular plane   Expected PROM: Elevation - 145-160; ER - 40-50 ; functional IR to L1  Recover AROM to approach as close to PROM available as possible  Establish dynamic stability of the shoulder  Forward elevation in scapular plane active progression: supine to incline to vertical; short to long lever arm  Lateral raise with  bent elbow; side-lying abduction  Active ER/IR with arm at side  Scapular retraction with light band resistance  Serratus anterior punches in supine; avoid wall, incline or prone press-ups for serratus anterior  Functional IR with hand slide up back - very gentle and gradual   AROM equals/approaches PROM with good mechanics for elevation  No pain  Higher level demand on shoulder than ADL functions PROM for elevation, ER(0)  AROM for elevation, ER(0) and functional IR  Patient reported outcome measure  Pain             PHASE PRECAUTIONS AND GUIDELINES GOALS EXERCISES CRITERIA TO ADVANCE  EXAMINATION  4 (Post-operative week 12+) No heavy lifting and no overhead sports  Weight lifting limit 25.lb  No heavy pushing activity  Gradually increase strength   NO UPPER BODY ERGOMETER  Optimize functional use of operative UE to patient specific goals  Gradual increase in deltoid, scapular muscle and rotator cuff strength  Pain-free functional activities Light hand weights for deltoid up to and not to exceed 3 lbs for anterior and posterior with long arm lift against gravity; elbow  bent to 90 deg for abduction in scapular plane  Band progression for extension to hip with scapular depression/retraction  Band progression for serratus anterior punches in supine; avoid wall, incline or prone press-ups for serratus anterior  End-range stretching gently without forceful overpressure in all planes (elevation in scapular plane, ER in scapular plane, functional IR) with stretching done for life as part of daily routine  NO UPPER BODY ERGOMETER  Pain-free AROM for shoulder elevation (expect around 135-150 deg)  Functional strength for all ADLs, work tasks, and hobbies approved by [employer] with home maintenance program PROM for elevation, ER(0); ER(90)  AROM for elevation, ER(0) and functional IR  Scapulohumeral rhythm/biomechanics of active movement strategies  Strength testing for deltoid, RTC, scapular muscles  Patient reported outcome measure  Pain

## 2023-12-13 ENCOUNTER — Ambulatory Visit: Payer: 59

## 2023-12-13 DIAGNOSIS — M6281 Muscle weakness (generalized): Secondary | ICD-10-CM

## 2023-12-13 DIAGNOSIS — G8929 Other chronic pain: Secondary | ICD-10-CM

## 2023-12-13 DIAGNOSIS — Z96612 Presence of left artificial shoulder joint: Secondary | ICD-10-CM

## 2023-12-13 DIAGNOSIS — M25512 Pain in left shoulder: Secondary | ICD-10-CM | POA: Diagnosis not present

## 2023-12-15 ENCOUNTER — Ambulatory Visit: Payer: 59

## 2023-12-15 DIAGNOSIS — G8929 Other chronic pain: Secondary | ICD-10-CM | POA: Diagnosis not present

## 2023-12-15 DIAGNOSIS — Z96612 Presence of left artificial shoulder joint: Secondary | ICD-10-CM

## 2023-12-15 DIAGNOSIS — M6281 Muscle weakness (generalized): Secondary | ICD-10-CM | POA: Diagnosis not present

## 2023-12-15 DIAGNOSIS — M25512 Pain in left shoulder: Secondary | ICD-10-CM | POA: Diagnosis not present

## 2023-12-15 NOTE — Therapy (Signed)
OUTPATIENT PHYSICAL THERAPY TREATMENT NOTE   Patient Name: [name] MRN: [id number] DOB:09-18-1951, 73 y.o., female Today's Date: 12/15/2023  END OF SESSION:  PT End of Session - 12/15/23 1148     Visit Number 12    Number of Visits 16    Date for PT Re-Evaluation 12/19/23    Authorization Type [employer] MCR    PT Start Time 1130    PT Stop Time 1210    PT Time Calculation (min) 40 min    Activity Tolerance Patient limited by pain;Patient tolerated treatment well    Behavior During Therapy [facility] for tasks assessed/performed                 Past Medical History:  Diagnosis Date   Anemia    Arthritis    Asthma    Blood transfusion without reported diagnosis    Patients believes 2015 when she overdosed on "medications"    Breast cancer of lower-outer quadrant of right female breast (HCC) 02/13/2015   Cataract    CHF, acute (HCC) 05/03/2012   Depression    Diabetes mellitus    type 2   Eczema    Fatty liver    Fatty infiltration of liver noted on 03/2012 CT scan   Fibromyalgia    Glaucoma    bilateral   Heart disease    History of kidney stones    w/ hx of hydronephrosis - followed by [facility]   HIV nonspecific serology    2006: indeterminate HIV blood test, seen by ID, felt secondary to cross reacting antibodies with no further workup felt necessary at that time    Hyperlipidemia    Hypertension    Obesity    Personal history of radiation therapy 2016   right   Radiation 05/07/15-06/23/15   Right Breast   Past Surgical History:  Procedure Laterality Date   BREAST BIOPSY Left 02/10/2015   malignant    BREAST LUMPECTOMY Right 03/21/2015   CHOLECYSTECTOMY  2003   CYSTOSCOPY W/ LITHOLAPAXY / [facility]     LEFT AND RIGHT HEART CATHETERIZATION WITH CORONARY ANGIOGRAM N/A 05/02/2012   Procedure: LEFT AND RIGHT HEART CATHETERIZATION WITH CORONARY ANGIOGRAM;  Surgeon: Kathleene Hazel, MD;  Location: [facility] CATH LAB;  Service: Cardiovascular;  Laterality: N/A;    RADIOACTIVE SEED GUIDED PARTIAL MASTECTOMY WITH AXILLARY SENTINEL LYMPH NODE BIOPSY Right 03/21/2015   Procedure: RIGHT  PARTIAL MASTECTOMY WITH RADIOACTIVE SEED LOCALIZATION RIGHT  AXILLARY SENTINEL LYMPH NODE BIOPSY;  Surgeon: Claud Kelp, MD;  Location: [facility];  Service: General;  Laterality: Right;   REPLACEMENT TOTAL KNEE BILATERAL  2005 &2006   REVERSE SHOULDER ARTHROPLASTY Left 09/27/2023   Procedure: LEFT REVERSE SHOULDER ARTHROPLASTY;  Surgeon: Huel Cote, MD;  Location: [facility] OR;  Service: Orthopedics;  Laterality: Left;   VASCULAR SURGERY Right 03/15/2013   Ultrasound guided sclerotherapy   Patient Active Problem List   Diagnosis Date Noted   Primary osteoarthritis, left shoulder 09/27/2023   Renal disease due to diabetes mellitus (HCC) 04/28/2023   Left shoulder pain 09/06/2022   Tinea pedis 04/09/2020   Spinal stenosis of lumbar region without neurogenic claudication 02/08/2019   Gout 05/16/2018   Gait instability 01/03/2018   Tremor of both hands    Stuttering    Essential hypertension    Hyperlipidemia 08/13/2015   Breast cancer of lower-outer quadrant of right female breast (HCC) 02/13/2015   Type 2 diabetes mellitus with diabetic foot deformity (HCC) 01/01/2014   Chronic combined  systolic and diastolic heart failure (HCC) 02/26/2013   OBESITY 02/05/2009   Asthma, intermittent 02/08/2008   Neuropathy due to secondary diabetes (HCC) 05/18/2007   NEPHROLITHIASIS 01/26/2007   DJD (degenerative joint disease), multiple sites 01/26/2007    PCP: Carney Living, MD   REFERRING PROVIDER: Huel Cote, MD  REFERRING DIAG: [phone] (ICD-10-CM) - Primary osteoarthritis, left shoulder  THERAPY DIAG:  Chronic left shoulder pain  Status post reverse total replacement of left shoulder  Muscle weakness (generalized)  Rationale for Evaluation and Treatment: Rehabilitation  ONSET DATE: 09/22/23  Next MD appt: 12/23/23 Dr.  Steward Drone  SUBJECTIVE:                                                                                                                                                                                      SUBJECTIVE STATEMENT: L shoulder remains stiff and sore.  Main limitation is reaching OH into cabinets.  Hand dominance: Right  PERTINENT HISTORY: 2-week status post reverse shoulder arthroplasty without complication. This time I would like her to work particular with outpatient physical therapy to work on regaining her forward flexion and abduction as this is quite limited. She will continue to work on this. I will plan to see him back in 4 weeks for reassessment  PAIN:  Are you having pain? Yes: NPRS scale: 9/10 Pain location: L shoulder Pain description: ache Aggravating factors: activity and weightbearing posiitons Relieving factors: rest and meds    PRECAUTIONS: Shoulder  RED FLAGS: None   WEIGHT BEARING RESTRICTIONS: No  FALLS:  Has patient fallen in last 6 months? No  OCCUPATION: retired  PLOF: Independent with basic ADLs  PATIENT GOALS:To move my shoulder again  NEXT MD VISIT:   OBJECTIVE:  Note: Objective measures were completed at Evaluation unless otherwise noted.  DIAGNOSTIC FINDINGS:  none  PATIENT SURVEYS:  FOTO 38(59 predicted)  12/08/23 49%  POSTURE: Forward and rounded shoulders   UPPER EXTREMITY ROM:   PROM Right eval Left eval L 11/16/23 L 12/01/23  Shoulder flexion  60d 110d 110d  Shoulder extension  30d    Shoulder scaption   60d 85d 100d  Shoulder abduction   85d   Shoulder internal rotation      Shoulder external rotation   28d   Elbow flexion  WFL    Elbow extension  WFL    Wrist flexion      Wrist extension      Wrist ulnar deviation      Wrist radial deviation      Wrist pronation      Wrist supination      (Blank rows = not tested)  UPPER  EXTREMITY MMT: Deferred post-op  MMT Right eval Left eval  Shoulder flexion     Shoulder extension    Shoulder abduction    Shoulder adduction    Shoulder internal rotation    Shoulder external rotation    Middle trapezius    Lower trapezius    Elbow flexion    Elbow extension    Wrist flexion    Wrist extension    Wrist ulnar deviation    Wrist radial deviation    Wrist pronation    Wrist supination    Grip strength (lbs)    (Blank rows = not tested)  SHOULDER SPECIAL TESTS: Deferred post-op  JOINT MOBILITY TESTING:  Deferred post op  PALPATION:  deferred   TODAY'S TREATMENT:   [facility] Adult PT Treatment:                                                DATE: 12/15/23 Therapeutic Exercise: Nustep L4 6 min Supine chest press 500g ball 30x L flexion in supine 500g ball 30x R S/L abduction 500g ball 30x Supine flexion with UERanger 30x Supine press with UERanger 30x Flexion on wall with towel 15x Scaption on wall with towel 15x Manual Therapy: PROM 20d ER, 80d ABD, 110d FF  [facility] Adult PT Treatment:                                                DATE: 12/13/23 Therapeutic Exercise: Nustep L3 8 min Supine chest press 500g ball  AROM L flexion in supine 3x5 Supine flexion with UERanger 30x Supine press with UERanger 30x Flexion on wall with towel 15x Scaption on wall with towel 15x Manual Therapy: PROM 30d ER, 80d ABD, 100d FF  [facility] Adult PT Treatment:                                                DATE: 12/08/23 Therapeutic Exercise: Nustep L3 6 min Flexion on wall with towel 15x Scaption on wall with towel 15x Seated UE Ranger scaption 15x Seated UE Ranger flexion 15x Manual Therapy: PROM/STM and pec release/mobs 30d ER, 90d ABD, 110 FF  [facility] Adult PT Treatment:                                                DATE: 12/05/23 Therapeutic Exercise: Nustep L2 6 min B ball roll flexion on wall 15x Standing shoulder extension YTB 15x Standing shoulder flexion YTB 15x Supine flexion with UERanger 30x Supine press with UERanger 30x S/L abduction  elbow extended 30x S/L shoulder flexion elbow extended 30x   [facility] Adult PT Treatment:                                                DATE: 12/01/23 Therapeutic Exercise: OH pulleys 6 min Seated B abd  in scapular plane, short lever arm 30x - cues to decrease shoulder shrug Seated rows YTB 15x2 Supine press with stick 3x10 Supine flexion with stick 3x10 Sidelie ABD 3x10 (elbow flexed - verbal and tactile cues to decrease shrug) R sidelie shoulder flexion 30x Manual Therapy: PROM/STM and pec release/mobs 30d ER, 90d ABD, 110 FF   [facility] Adult PT Treatment:                                                DATE: 11/21/23 Therapeutic Exercise: OH pulleys 6 min Supine press with stick 3x10 Supine press with stick 3x10 Sidelie ABD 3x10 (elbow flexed) R sidelie shoulder flexion 30x Seated B abd in scapular plane, short lever arm 30x Seated rows YTB 15x2 Manual Therapy: PROM/STM and pec release/mobs 30d ER, 90d ABD, 110 FF   [facility] Adult PT Treatment:                                                DATE: 11/16/23 Therapeutic Exercise: OH pulleys 6 min Supine press with stick 3x10 Supine press with stick 3x10 Sidelie ABD 3x10 (elbow flexed) Manual Therapy: PROM/STM and pec release/mobs 28d ER, 85d ABD, 100 FF                                                    PATIENT EDUCATION: Education details: Discussed eval findings, rehab rationale and POC and patient is in agreement  Person educated: Patient Education method: Explanation Education comprehension: verbalized understanding and needs further education  HOME EXERCISE PROGRAM: Access Code: [facility] URL: [email or web address] Date: 12/05/2023 Prepared by: Gustavus Bryant  Exercises - Seated Shoulder Shrugs  - 5 x daily - 7 x weekly - 10 reps - Seated Scapular Retraction  - 5 x daily - 7 x weekly - 10 reps - Supine Shoulder Flexion with Dowel  - 2 x daily - 5 x weekly - 3 sets - 10 reps - 2s hold - Supine Shoulder  Press with Dowel  - 2 x daily - 5 x weekly - 3 sets - 10 reps - 3s hold - Sidelying Shoulder Abduction Palm Forward  - 2 x daily - 5 x weekly - 3 sets - 10 reps - 3s hold - Standing Single Arm Shoulder Flexion with Posterior Anchored Resistance  - 2 x daily - 5 x weekly - 3 sets - 10 reps - Single Arm Shoulder Extension with Anchored Resistance  - 2 x daily - 5 x weekly - 3 sets - 10 reps  ASSESSMENT:  CLINICAL IMPRESSION: PROM unchanged and not likely to improve significantly due to suspected scar tissue/disuse atrophy over time.  Focus of today was strengthening in available ROM with patient able to initiate deltoid contraction in supine and S/L positions.     Patient is a 73 y.o. female who was seen today for physical therapy evaluation and treatment for L shoulder weakness, pain and loss of motion following RTSA. Patient arrived for session holding purse in L hand and advised to avoid in future.  Patient demonstrates decreased L shoulder A/PROM along  with strength deficits following surgical procedure.  Patient is good candidate for OPPT.  OBJECTIVE IMPAIRMENTS: decreased activity tolerance, decreased knowledge of condition, decreased mobility, decreased ROM, decreased strength, impaired perceived functional ability, impaired UE functional use, and pain.   ACTIVITY LIMITATIONS: carrying, lifting, sleeping, bed mobility, and dressing  PERSONAL FACTORS: Age, Fitness, Past/current experiences, Time since onset of injury/illness/exacerbation, and 1-2 comorbidities: Asthma CHF  are also affecting patient's functional outcome.   REHAB POTENTIAL: Good  CLINICAL DECISION MAKING: Stable/uncomplicated  EVALUATION COMPLEXITY: Low   GOALS: Goals reviewed with patient? No  SHORT TERM GOALS: Target date: 11/16/2023    Patient to demonstrate independence in HEP  Baseline: [id number] Goal status: Met  2.  Increase PROM to 90d flexion and scaption Baseline:  PROM Right eval Left eval  L 11/16/23 L 12/01/23  Shoulder flexion  60d 110d 110d  Shoulder extension  30d    Shoulder scaption   60d 85d 100d   Goal status: Met  3.  6/10 worst pain level Baseline: 9/10; 12/08/23 7/10 Goal status: Progressing   LONG TERM GOALS: Target date: 12/19/2023    Patient to demonstrate independence in HEP  Baseline: [id number] Goal status: Met  2.  Patient will score at least 59% on FOTO to signify clinically meaningful improvement in functional abilities.   Baseline: 38%; 12/08/23 49% Goal status: Ongoing  3.  Patient will acknowledge 4/10 pain at least once during episode of care   Baseline: 9/10; 12/08/23 7/10 Goal status: Ongoing  4.  Patient will be able to reach [facility] to allow more participation in ADLs Baseline: AROM limited to below shoulder height.:12/08/23 FF to 100d Goal status: INITIAL   PLAN:  PT FREQUENCY: 2x/week  PT DURATION: 6 weeks  PLANNED INTERVENTIONS: 97164- PT Re-evaluation, 97110-Therapeutic exercises, 97530- Therapeutic activity, 97112- Neuromuscular re-education, 97535- Self Care, [id number]- Manual therapy, Patient/Family education, and DME instructions  PLAN FOR NEXT SESSION: HEP review and update, manual techniques as appropriate, aerobic tasks, ROM and flexibility activities, strengthening and PREs, TPDN, gait and balance training as needed    Date of referral: 08/17/23 Referring provider: Huel Cote, MD Referring diagnosis? M19.012 (ICD-10-CM) - Primary osteoarthritis, left shoulder Treatment diagnosis? (if different than referring diagnosis) M19.012 (ICD-10-CM) - Primary osteoarthritis, left shoulder  What was this (referring dx) caused by? Arthritis  Nature of Condition: Chronic (continuous duration > 3 months)   Laterality: Lt  Current Functional Measure Score: FOTO 38%  Objective measurements identify impairments when they are compared to normal values, the uninvolved extremity, and prior level of function.  [x]  Yes  []  No  Objective  assessment of functional ability: Severe functional limitations   Briefly describe symptoms: L shoulder pain and soreness restricting ROM and function  How did symptoms start: degenerative condition  Average pain intensity:  Last 24 hours: 9  Past week: 9  How often does the pt experience symptoms? Frequently  How much have the symptoms interfered with usual daily activities? Extremely  How has condition changed since care began at this facility? NA - initial visit  In general, how is the patients overall health? Fair   BACK PAIN (STarT Back Screening Tool) No   Hildred Laser, PT 12/15/2023, 12:11 PM   REVERSE SHOULDER ARTHROPLASTY REHABILITATION GUIDELINES These guidelines do NOT apply to RTSA for proximal humeral fracture - rehabilitation following PHFx will commence 4-6 weeks after surgery as deemed appropriate to protect tuberosity healing PHASE PRECAUTIONS AND GUIDELINES GOALS EXERCISES CRITERIA TO ADVANCE  EXAMINATION  1 (Post-operative day  1 to post-operative week 3) Sling 24/7 (remove for grooming and home exercise program 3-5x/day)  Avoid combined IR/EXT/ADD (hand behind the back) and IR/ADD (reaching across chest) for dislocation precautions  Pillow behind the upper arm while reclining with sling on   Patient should always be able to see the elbow  Avoid WBing - discuss WBing need with physician and PT  No submersion in water until after 4 weeks  Ice after HEP as needed  Maintain integrity of joint replacement; protect soft tissue healing  ROM for elevation to 130 and ER to 30   Optimize distal UE circulation and muscle activity (elbow, wrist and hand)  Instruct in use of sling for proper fit  Educate regarding signs/symptoms of infection    Active elbow, wrist and hand  Pendulum  Scapular retraction with arms resting in neutral position  Forward elevation in scapular plane to 130 deg max motion (table slides, step backs, supine well arm  assisted)  ER in scapular plane to 30 deg (seated or supine)  ROM within precautionary range limits may be active or passive   Pain less than 3/10 with ROM  Healing incision without signs of infection  Clearance by surgeon to advance after 2 week post-operative visit Wound assessment  Swelling assessment of upper extremity  Neurovascular assessment of upper extremity  Sling fit and ability to don/doff properly  Patient reported outcome measure  Pain level  Range of motion for elevation and ER   PHASE PRECAUTIONS AND GUIDELINES GOALS EXERCISES CRITERIA TO ADVANCE  EXAMINATION  2 (Post-operative week 3 to 6) May discontinue sling use at 3 weeks; after 2 weeks can remove the sling at home and just use the sling at night and in community for 3rd week  May use arm for basic activities of daily living (such as feeding, brushing teeth, dressing.)   May submerge in water after 4 weeks  Avoid combined IR/EXT/ADD (hand behind the back) and IR/ADD (reaching across chest) for dislocation precautions  Avoid acromial or scapular spine pain as increase deltoid loading - decrease load if this occurs  Elevation to 130 deg and ER to 30 deg - passive, active assisted or active  Low (less than 3/10) to no pain  Ability to fire all heads of the deltoid May discontinue grip, and active elbow and wrist exercises since using the arm in ADLs with sling removed around the home  Continue elevation to 130 and ER to 30, both in scapular plane   Submaximal isometrics (pain-free effort) for all functional heads of deltoid (anterior, posterior, middle).   Active exercise as able:  Supine forward punch  Place in balanced position with circumduction and progressive arcs in sagittal plane  Side-lying abduction to 90   Lateral raise with bent elbow   Prone extension to hip  Elevation in scapular plane to 130; ER in scapular plane to 30   Ability to fire isometrically all heads of the deltoid  muscle without pain  Ability to place and hold the arm in balanced position (90 deg elevation in supine) Wound assessment  Neurovascular assessment  Swelling assessment  ROM shoulder elevation and ER(0)  Patient reported outcome measure  Pain level   PHASE PRECAUTIONS AND GUIDELINES GOALS EXERCISES CRITERIA TO ADVANCE  EXAMINATION  3 (Post-operative week 6 to 12) Avoid forceful end-range motion in any direction   Progress active use of the arm in ADLs without being restricted to arm by the side of the body;   No heavy  lifting or carrying  Initiate functional IR behind the back gently without forceful overpressure  Avoid acromial or scapular spine pain as increase deltoid loading - decrease load if this occurs  NO UPPER BODY ERGOMETER  Optimize ROM for elevation and ER in scapular plane   Expected PROM: Elevation - 145-160; ER - 40-50 ; functional IR to L1  Recover AROM to approach as close to PROM available as possible  Establish dynamic stability of the shoulder  Forward elevation in scapular plane active progression: supine to incline to vertical; short to long lever arm  Lateral raise with bent elbow; side-lying abduction  Active ER/IR with arm at side  Scapular retraction with light band resistance  Serratus anterior punches in supine; avoid wall, incline or prone press-ups for serratus anterior  Functional IR with hand slide up back - very gentle and gradual   AROM equals/approaches PROM with good mechanics for elevation  No pain  Higher level demand on shoulder than ADL functions PROM for elevation, ER(0)  AROM for elevation, ER(0) and functional IR  Patient reported outcome measure  Pain             PHASE PRECAUTIONS AND GUIDELINES GOALS EXERCISES CRITERIA TO ADVANCE  EXAMINATION  4 (Post-operative week 12+) No heavy lifting and no overhead sports  Weight lifting limit 25.lb  No heavy pushing activity  Gradually increase strength   NO  UPPER BODY ERGOMETER  Optimize functional use of operative UE to patient specific goals  Gradual increase in deltoid, scapular muscle and rotator cuff strength  Pain-free functional activities Light hand weights for deltoid up to and not to exceed 3 lbs for anterior and posterior with long arm lift against gravity; elbow bent to 90 deg for abduction in scapular plane  Band progression for extension to hip with scapular depression/retraction  Band progression for serratus anterior punches in supine; avoid wall, incline or prone press-ups for serratus anterior  End-range stretching gently without forceful overpressure in all planes (elevation in scapular plane, ER in scapular plane, functional IR) with stretching done for life as part of daily routine  NO UPPER BODY ERGOMETER  Pain-free AROM for shoulder elevation (expect around 135-150 deg)  Functional strength for all ADLs, work tasks, and hobbies approved by [employer], television/film set with home maintenance program PROM for elevation, ER(0); ER(90)  AROM for elevation, ER(0) and functional IR  Scapulohumeral rhythm/biomechanics of active movement strategies  Strength testing for deltoid, RTC, scapular muscles  Patient reported outcome measure  Pain

## 2023-12-16 NOTE — Therapy (Unsigned)
OUTPATIENT PHYSICAL THERAPY TREATMENT NOTE   Patient Name: [name] MRN: [id number] DOB:1951/11/29, 73 y.o., female Today's Date: 12/19/2023  END OF SESSION:  PT End of Session - 12/19/23 0941     Visit Number 13    Number of Visits 16    Date for PT Re-Evaluation 12/19/23    Authorization Type [employer] MCR    PT Start Time 0940    Activity Tolerance Patient limited by pain;Patient tolerated treatment well    Behavior During Therapy [facility] for tasks assessed/performed                  Past Medical History:  Diagnosis Date   Anemia    Arthritis    Asthma    Blood transfusion without reported diagnosis    Patients believes 2015 when she overdosed on "medications"    Breast cancer of lower-outer quadrant of right female breast (HCC) 02/13/2015   Cataract    CHF, acute (HCC) 05/03/2012   Depression    Diabetes mellitus    type 2   Eczema    Fatty liver    Fatty infiltration of liver noted on 03/2012 CT scan   Fibromyalgia    Glaucoma    bilateral   Heart disease    History of kidney stones    w/ hx of hydronephrosis - followed by [facility]   HIV nonspecific serology    2006: indeterminate HIV blood test, seen by ID, felt secondary to cross reacting antibodies with no further workup felt necessary at that time    Hyperlipidemia    Hypertension    Obesity    Personal history of radiation therapy 2016   right   Radiation 05/07/15-06/23/15   Right Breast   Past Surgical History:  Procedure Laterality Date   BREAST BIOPSY Left 02/10/2015   malignant    BREAST LUMPECTOMY Right 03/21/2015   CHOLECYSTECTOMY  2003   CYSTOSCOPY W/ LITHOLAPAXY / [facility]     LEFT AND RIGHT HEART CATHETERIZATION WITH CORONARY ANGIOGRAM N/A 05/02/2012   Procedure: LEFT AND RIGHT HEART CATHETERIZATION WITH CORONARY ANGIOGRAM;  Surgeon: Kathleene Hazel, MD;  Location: [facility] CATH LAB;  Service: Cardiovascular;  Laterality: N/A;   RADIOACTIVE SEED GUIDED PARTIAL MASTECTOMY WITH  AXILLARY SENTINEL LYMPH NODE BIOPSY Right 03/21/2015   Procedure: RIGHT  PARTIAL MASTECTOMY WITH RADIOACTIVE SEED LOCALIZATION RIGHT  AXILLARY SENTINEL LYMPH NODE BIOPSY;  Surgeon: Claud Kelp, MD;  Location: [facility];  Service: General;  Laterality: Right;   REPLACEMENT TOTAL KNEE BILATERAL  2005 &2006   REVERSE SHOULDER ARTHROPLASTY Left 09/27/2023   Procedure: LEFT REVERSE SHOULDER ARTHROPLASTY;  Surgeon: Huel Cote, MD;  Location: [facility] OR;  Service: Orthopedics;  Laterality: Left;   VASCULAR SURGERY Right 03/15/2013   Ultrasound guided sclerotherapy   Patient Active Problem List   Diagnosis Date Noted   Primary osteoarthritis, left shoulder 09/27/2023   Renal disease due to diabetes mellitus (HCC) 04/28/2023   Left shoulder pain 09/06/2022   Tinea pedis 04/09/2020   Spinal stenosis of lumbar region without neurogenic claudication 02/08/2019   Gout 05/16/2018   Gait instability 01/03/2018   Tremor of both hands    Stuttering    Essential hypertension    Hyperlipidemia 08/13/2015   Breast cancer of lower-outer quadrant of right female breast (HCC) 02/13/2015   Type 2 diabetes mellitus with diabetic foot deformity (HCC) 01/01/2014   Chronic combined systolic and diastolic heart failure (HCC) 02/26/2013   OBESITY 02/05/2009   Asthma, intermittent  02/08/2008   Neuropathy due to secondary diabetes (HCC) 05/18/2007   NEPHROLITHIASIS 01/26/2007   DJD (degenerative joint disease), multiple sites 01/26/2007    PCP: Carney Living, MD   REFERRING PROVIDER: Huel Cote, MD  REFERRING DIAG: [phone] (ICD-10-CM) - Primary osteoarthritis, left shoulder  THERAPY DIAG:  Chronic left shoulder pain  Status post reverse total replacement of left shoulder  Muscle weakness (generalized)  Rationale for Evaluation and Treatment: Rehabilitation  ONSET DATE: 09/22/23  Next MD appt: 12/23/23 Dr. Steward Drone  SUBJECTIVE:                                                                                                                                                                                       SUBJECTIVE STATEMENT: Unable to identify any significant functional gains in L shoulder function over the past several weeks.  Hand dominance: Right  PERTINENT HISTORY: 2-week status post reverse shoulder arthroplasty without complication. This time I would like her to work particular with outpatient physical therapy to work on regaining her forward flexion and abduction as this is quite limited. She will continue to work on this. I will plan to see him back in 4 weeks for reassessment  PAIN:  Are you having pain? Yes: NPRS scale: 9/10 Pain location: L shoulder Pain description: ache Aggravating factors: activity and weightbearing posiitons Relieving factors: rest and meds    PRECAUTIONS: Shoulder  RED FLAGS: None   WEIGHT BEARING RESTRICTIONS: No  FALLS:  Has patient fallen in last 6 months? No  OCCUPATION: retired  PLOF: Independent with basic ADLs  PATIENT GOALS:To move my shoulder again  NEXT MD VISIT:   OBJECTIVE:  Note: Objective measures were completed at Evaluation unless otherwise noted.  DIAGNOSTIC FINDINGS:  none  PATIENT SURVEYS:  FOTO 38(59 predicted)  12/08/23 49%  POSTURE: Forward and rounded shoulders   UPPER EXTREMITY ROM:   PROM Right eval Left eval L 11/16/23 L 12/01/23 L 12/19/23  Shoulder flexion  60d 110d 110d 40/110d  Shoulder extension  30d     Shoulder scaption   60d 85d 100d 45/80d  Shoulder abduction   85d    Shoulder internal rotation       Shoulder external rotation   28d  10/20d  Elbow flexion  WFL     Elbow extension  WFL     Wrist flexion       Wrist extension       Wrist ulnar deviation       Wrist radial deviation       Wrist pronation       Wrist supination       (Blank rows = not  tested)  UPPER EXTREMITY MMT: Deferred post-op  MMT Right eval Left eval L 12/19/23  Shoulder  flexion   3  Shoulder extension   3+  Shoulder abduction   3  Shoulder adduction     Shoulder internal rotation   3  Shoulder external rotation   2  Middle trapezius     Lower trapezius     Elbow flexion     Elbow extension     Wrist flexion     Wrist extension     Wrist ulnar deviation     Wrist radial deviation     Wrist pronation     Wrist supination     Grip strength (lbs)     (Blank rows = not tested)  SHOULDER SPECIAL TESTS: Deferred post-op  JOINT MOBILITY TESTING:  Deferred post op  PALPATION:  deferred   TODAY'S TREATMENT:   [facility] Adult PT Treatment:                                                DATE: 12/19/23 Therapeutic Exercise: Nustep L4 8 min Supine chest press 500g ball 30x L flexion in supine 500g ball 30x R S/L abduction 500g ball 30x Supine flexion with UERanger 30x Supine press with UERanger 30x ER walkouts YTB 3 steps x5  [facility] Adult PT Treatment:                                                DATE: 12/15/23 Therapeutic Exercise: Nustep L4 8 min Supine chest press 500g ball 30x L flexion in supine 500g ball 30x R S/L abduction 500g ball 30x Supine flexion with UERanger 30x Supine press with UERanger 30x Flexion on wall with towel 15x Scaption on wall with towel 15x Manual Therapy: PROM 20d ER, 80d ABD, 110d FF  [facility] Adult PT Treatment:                                                DATE: 12/13/23 Therapeutic Exercise: Nustep L3 8 min Supine chest press 500g ball  AROM L flexion in supine 3x5 Supine flexion with UERanger 30x Supine press with UERanger 30x Flexion on wall with towel 15x Scaption on wall with towel 15x Manual Therapy: PROM 30d ER, 80d ABD, 100d FF  [facility] Adult PT Treatment:                                                DATE: 12/08/23 Therapeutic Exercise: Nustep L3 6 min Flexion on wall with towel 15x Scaption on wall with towel 15x Seated UE Ranger scaption 15x Seated UE Ranger flexion 15x Manual Therapy: PROM/STM  and pec release/mobs 30d ER, 90d ABD, 110 FF  [facility] Adult PT Treatment:  DATE: 12/05/23 Therapeutic Exercise: Nustep L2 6 min B ball roll flexion on wall 15x Standing shoulder extension YTB 15x Standing shoulder flexion YTB 15x Supine flexion with UERanger 30x Supine press with UERanger 30x S/L abduction elbow extended 30x S/L shoulder flexion elbow extended 30x   [facility] Adult PT Treatment:                                                DATE: 12/01/23 Therapeutic Exercise: OH pulleys 6 min Seated B abd in scapular plane, short lever arm 30x - cues to decrease shoulder shrug Seated rows YTB 15x2 Supine press with stick 3x10 Supine flexion with stick 3x10 Sidelie ABD 3x10 (elbow flexed - verbal and tactile cues to decrease shrug) R sidelie shoulder flexion 30x Manual Therapy: PROM/STM and pec release/mobs 30d ER, 90d ABD, 110 FF   [facility] Adult PT Treatment:                                                DATE: 11/21/23 Therapeutic Exercise: OH pulleys 6 min Supine press with stick 3x10 Supine press with stick 3x10 Sidelie ABD 3x10 (elbow flexed) R sidelie shoulder flexion 30x Seated B abd in scapular plane, short lever arm 30x Seated rows YTB 15x2 Manual Therapy: PROM/STM and pec release/mobs 30d ER, 90d ABD, 110 FF   [facility] Adult PT Treatment:                                                DATE: 11/16/23 Therapeutic Exercise: OH pulleys 6 min Supine press with stick 3x10 Supine press with stick 3x10 Sidelie ABD 3x10 (elbow flexed) Manual Therapy: PROM/STM and pec release/mobs 28d ER, 85d ABD, 100 FF                                                    PATIENT EDUCATION: Education details: Discussed eval findings, rehab rationale and POC and patient is in agreement  Person educated: Patient Education method: Explanation Education comprehension: verbalized understanding and needs further education  HOME EXERCISE PROGRAM: Access  Code: [facility] URL: [email or web address] Date: 12/05/2023 Prepared by: Gustavus Bryant  Exercises - Seated Shoulder Shrugs  - 5 x daily - 7 x weekly - 10 reps - Seated Scapular Retraction  - 5 x daily - 7 x weekly - 10 reps - Supine Shoulder Flexion with Dowel  - 2 x daily - 5 x weekly - 3 sets - 10 reps - 2s hold - Supine Shoulder Press with Dowel  - 2 x daily - 5 x weekly - 3 sets - 10 reps - 3s hold - Sidelying Shoulder Abduction Palm Forward  - 2 x daily - 5 x weekly - 3 sets - 10 reps - 3s hold - Standing Single Arm Shoulder Flexion with Posterior Anchored Resistance  - 2 x daily - 5 x weekly - 3 sets - 10 reps - Single Arm Shoulder Extension with Anchored Resistance  -  2 x daily - 5 x weekly - 3 sets - 10 reps  ASSESSMENT:  CLINICAL IMPRESSION: Patient is currently 3 months post-op with moderate levels of discomfort reported.  Reviewed AAROM techniques and added isometric L ER during walkouts with patient unable to maintain neutral position against resistance.  She continued to present with ROM and strength deficits which appear to have plateaued.  At this time, patient at maximum rehab potential.  Recommend 2 additional visit following MD f/u to establish home based program.   Patient is a 73 y.o. female who was seen today for physical therapy evaluation and treatment for L shoulder weakness, pain and loss of motion following RTSA. Patient arrived for session holding purse in L hand and advised to avoid in future.  Patient demonstrates decreased L shoulder A/PROM along with strength deficits following surgical procedure.  Patient is good candidate for OPPT.  OBJECTIVE IMPAIRMENTS: decreased activity tolerance, decreased knowledge of condition, decreased mobility, decreased ROM, decreased strength, impaired perceived functional ability, impaired UE functional use, and pain.   ACTIVITY LIMITATIONS: carrying, lifting, sleeping, bed mobility, and dressing  PERSONAL FACTORS:  Age, Fitness, Past/current experiences, Time since onset of injury/illness/exacerbation, and 1-2 comorbidities: Asthma CHF  are also affecting patient's functional outcome.   REHAB POTENTIAL: Good  CLINICAL DECISION MAKING: Stable/uncomplicated  EVALUATION COMPLEXITY: Low   GOALS: Goals reviewed with patient? No  SHORT TERM GOALS: Target date: 11/16/2023    Patient to demonstrate independence in HEP  Baseline: [id number] Goal status: Met  2.  Increase PROM to 90d flexion and scaption Baseline:  PROM Right eval Left eval L 11/16/23 L 12/01/23  Shoulder flexion  60d 110d 110d  Shoulder extension  30d    Shoulder scaption   60d 85d 100d   Goal status: Met  3.  6/10 worst pain level Baseline: 9/10; 12/08/23 7/10 Goal status: Progressing   LONG TERM GOALS: Target date: 12/19/2023    Patient to demonstrate independence in HEP  Baseline: [id number] Goal status: Met  2.  Patient will score at least 59% on FOTO to signify clinically meaningful improvement in functional abilities.   Baseline: 38%; 12/08/23 49% Goal status: Ongoing  3.  Patient will acknowledge 4/10 pain at least once during episode of care   Baseline: 9/10; 12/08/23 7/10 Goal status: Ongoing  4.  Patient will be able to reach [facility] to allow more participation in ADLs Baseline: AROM limited to below shoulder height.:12/08/23 FF to 100d Goal status: Not met   PLAN:  PT FREQUENCY: 1x/week  PT DURATION: 2 weeks  PLANNED INTERVENTIONS: 97164- PT Re-evaluation, 97110-Therapeutic exercises, 97530- Therapeutic activity, 97112- Neuromuscular re-education, 97535- Self Care, 46962- Manual therapy, Patient/Family education, and DME instructions  PLAN FOR NEXT SESSION: HEP review and update, manual techniques as appropriate, aerobic tasks, ROM and flexibility activities, strengthening and PREs, TPDN, gait and balance training as needed    Date of referral: 08/17/23 Referring provider: Huel Cote, MD Referring  diagnosis? M19.012 (ICD-10-CM) - Primary osteoarthritis, left shoulder Treatment diagnosis? (if different than referring diagnosis) M19.012 (ICD-10-CM) - Primary osteoarthritis, left shoulder  What was this (referring dx) caused by? Arthritis  Nature of Condition: Chronic (continuous duration > 3 months)   Laterality: Lt  Current Functional Measure Score: FOTO 38%  Objective measurements identify impairments when they are compared to normal values, the uninvolved extremity, and prior level of function.  [x]  Yes  []  No  Objective assessment of functional ability: Severe functional limitations   Briefly describe symptoms: L shoulder pain  and soreness restricting ROM and function  How did symptoms start: degenerative condition  Average pain intensity:  Last 24 hours: 9  Past week: 9  How often does the pt experience symptoms? Frequently  How much have the symptoms interfered with usual daily activities? Extremely  How has condition changed since care began at this facility? NA - initial visit  In general, how is the patients overall health? Fair   BACK PAIN (STarT Back Screening Tool) No   Hildred Laser, PT 12/19/2023, 10:19 AM   REVERSE SHOULDER ARTHROPLASTY REHABILITATION GUIDELINES These guidelines do NOT apply to RTSA for proximal humeral fracture - rehabilitation following PHFx will commence 4-6 weeks after surgery as deemed appropriate to protect tuberosity healing PHASE PRECAUTIONS AND GUIDELINES GOALS EXERCISES CRITERIA TO ADVANCE  EXAMINATION  1 (Post-operative day 1 to post-operative week 3) Sling 24/7 (remove for grooming and home exercise program 3-5x/day)  Avoid combined IR/EXT/ADD (hand behind the back) and IR/ADD (reaching across chest) for dislocation precautions  Pillow behind the upper arm while reclining with sling on   Patient should always be able to see the elbow  Avoid WBing - discuss WBing need with physician and PT  No submersion in water  until after 4 weeks  Ice after HEP as needed  Maintain integrity of joint replacement; protect soft tissue healing  ROM for elevation to 130 and ER to 30   Optimize distal UE circulation and muscle activity (elbow, wrist and hand)  Instruct in use of sling for proper fit  Educate regarding signs/symptoms of infection    Active elbow, wrist and hand  Pendulum  Scapular retraction with arms resting in neutral position  Forward elevation in scapular plane to 130 deg max motion (table slides, step backs, supine well arm assisted)  ER in scapular plane to 30 deg (seated or supine)  ROM within precautionary range limits may be active or passive   Pain less than 3/10 with ROM  Healing incision without signs of infection  Clearance by surgeon to advance after 2 week post-operative visit Wound assessment  Swelling assessment of upper extremity  Neurovascular assessment of upper extremity  Sling fit and ability to don/doff properly  Patient reported outcome measure  Pain level  Range of motion for elevation and ER   PHASE PRECAUTIONS AND GUIDELINES GOALS EXERCISES CRITERIA TO ADVANCE  EXAMINATION  2 (Post-operative week 3 to 6) May discontinue sling use at 3 weeks; after 2 weeks can remove the sling at home and just use the sling at night and in community for 3rd week  May use arm for basic activities of daily living (such as feeding, brushing teeth, dressing.)   May submerge in water after 4 weeks  Avoid combined IR/EXT/ADD (hand behind the back) and IR/ADD (reaching across chest) for dislocation precautions  Avoid acromial or scapular spine pain as increase deltoid loading - decrease load if this occurs  Elevation to 130 deg and ER to 30 deg - passive, active assisted or active  Low (less than 3/10) to no pain  Ability to fire all heads of the deltoid May discontinue grip, and active elbow and wrist exercises since using the arm in ADLs with sling removed around the  home  Continue elevation to 130 and ER to 30, both in scapular plane   Submaximal isometrics (pain-free effort) for all functional heads of deltoid (anterior, posterior, middle).   Active exercise as able:  Supine forward punch  Place in balanced position with circumduction and  progressive arcs in sagittal plane  Side-lying abduction to 90   Lateral raise with bent elbow   Prone extension to hip  Elevation in scapular plane to 130; ER in scapular plane to 30   Ability to fire isometrically all heads of the deltoid muscle without pain  Ability to place and hold the arm in balanced position (90 deg elevation in supine) Wound assessment  Neurovascular assessment  Swelling assessment  ROM shoulder elevation and ER(0)  Patient reported outcome measure  Pain level   PHASE PRECAUTIONS AND GUIDELINES GOALS EXERCISES CRITERIA TO ADVANCE  EXAMINATION  3 (Post-operative week 6 to 12) Avoid forceful end-range motion in any direction   Progress active use of the arm in ADLs without being restricted to arm by the side of the body;   No heavy lifting or carrying  Initiate functional IR behind the back gently without forceful overpressure  Avoid acromial or scapular spine pain as increase deltoid loading - decrease load if this occurs  NO UPPER BODY ERGOMETER  Optimize ROM for elevation and ER in scapular plane   Expected PROM: Elevation - 145-160; ER - 40-50 ; functional IR to L1  Recover AROM to approach as close to PROM available as possible  Establish dynamic stability of the shoulder  Forward elevation in scapular plane active progression: supine to incline to vertical; short to long lever arm  Lateral raise with bent elbow; side-lying abduction  Active ER/IR with arm at side  Scapular retraction with light band resistance  Serratus anterior punches in supine; avoid wall, incline or prone press-ups for serratus anterior  Functional IR with hand slide up back - very  gentle and gradual   AROM equals/approaches PROM with good mechanics for elevation  No pain  Higher level demand on shoulder than ADL functions PROM for elevation, ER(0)  AROM for elevation, ER(0) and functional IR  Patient reported outcome measure  Pain             PHASE PRECAUTIONS AND GUIDELINES GOALS EXERCISES CRITERIA TO ADVANCE  EXAMINATION  4 (Post-operative week 12+) No heavy lifting and no overhead sports  Weight lifting limit 25.lb  No heavy pushing activity  Gradually increase strength   NO UPPER BODY ERGOMETER  Optimize functional use of operative UE to patient specific goals  Gradual increase in deltoid, scapular muscle and rotator cuff strength  Pain-free functional activities Light hand weights for deltoid up to and not to exceed 3 lbs for anterior and posterior with long arm lift against gravity; elbow bent to 90 deg for abduction in scapular plane  Band progression for extension to hip with scapular depression/retraction  Band progression for serratus anterior punches in supine; avoid wall, incline or prone press-ups for serratus anterior  End-range stretching gently without forceful overpressure in all planes (elevation in scapular plane, ER in scapular plane, functional IR) with stretching done for life as part of daily routine  NO UPPER BODY ERGOMETER  Pain-free AROM for shoulder elevation (expect around 135-150 deg)  Functional strength for all ADLs, work tasks, and hobbies approved by [employer], television/film set with home maintenance program PROM for elevation, ER(0); ER(90)  AROM for elevation, ER(0) and functional IR  Scapulohumeral rhythm/biomechanics of active movement strategies  Strength testing for deltoid, RTC, scapular muscles  Patient reported outcome measure  Pain

## 2023-12-19 ENCOUNTER — Ambulatory Visit: Payer: 59

## 2023-12-19 DIAGNOSIS — M25512 Pain in left shoulder: Secondary | ICD-10-CM | POA: Diagnosis not present

## 2023-12-19 DIAGNOSIS — Z96612 Presence of left artificial shoulder joint: Secondary | ICD-10-CM

## 2023-12-19 DIAGNOSIS — M6281 Muscle weakness (generalized): Secondary | ICD-10-CM

## 2023-12-19 DIAGNOSIS — G8929 Other chronic pain: Secondary | ICD-10-CM | POA: Diagnosis not present

## 2023-12-23 ENCOUNTER — Ambulatory Visit (HOSPITAL_BASED_OUTPATIENT_CLINIC_OR_DEPARTMENT_OTHER): Payer: 59

## 2023-12-23 ENCOUNTER — Ambulatory Visit (HOSPITAL_BASED_OUTPATIENT_CLINIC_OR_DEPARTMENT_OTHER): Payer: 59 | Admitting: Orthopaedic Surgery

## 2023-12-23 DIAGNOSIS — M19012 Primary osteoarthritis, left shoulder: Secondary | ICD-10-CM

## 2023-12-23 DIAGNOSIS — Z96612 Presence of left artificial shoulder joint: Secondary | ICD-10-CM | POA: Diagnosis not present

## 2023-12-23 DIAGNOSIS — Z471 Aftercare following joint replacement surgery: Secondary | ICD-10-CM | POA: Diagnosis not present

## 2023-12-23 NOTE — Progress Notes (Signed)
Post Operative Evaluation    Procedure/Date of Surgery: Left reverse shoulder arthroplasty 10/29  Interval History:    Presents today 12 weeks status post left reverse shoulder arthroplasty.  Overall she is working to improve her range of motion which is slowly improving.  Overall she is quite happy with her outcome and does not have any pain.  PMH/PSH/Family History/Social History/Meds/Allergies:    Past Medical History:  Diagnosis Date   Anemia    Arthritis    Asthma    Blood transfusion without reported diagnosis    Patients believes 2015 when she overdosed on "medications"    Breast cancer of lower-outer quadrant of right female breast (HCC) 02/13/2015   Cataract    CHF, acute (HCC) 05/03/2012   Depression    Diabetes mellitus    type 2   Eczema    Fatty liver    Fatty infiltration of liver noted on 03/2012 CT scan   Fibromyalgia    Glaucoma    bilateral   Heart disease    History of kidney stones    w/ hx of hydronephrosis - followed by [facility]   HIV nonspecific serology    2006: indeterminate HIV blood test, seen by ID, felt secondary to cross reacting antibodies with no further workup felt necessary at that time    Hyperlipidemia    Hypertension    Obesity    Personal history of radiation therapy 2016   right   Radiation 05/07/15-06/23/15   Right Breast   Past Surgical History:  Procedure Laterality Date   BREAST BIOPSY Left 02/10/2015   malignant    BREAST LUMPECTOMY Right 03/21/2015   CHOLECYSTECTOMY  2003   CYSTOSCOPY W/ LITHOLAPAXY / [facility]     LEFT AND RIGHT HEART CATHETERIZATION WITH CORONARY ANGIOGRAM N/A 05/02/2012   Procedure: LEFT AND RIGHT HEART CATHETERIZATION WITH CORONARY ANGIOGRAM;  Surgeon: Kathleene Hazel, MD;  Location: [facility] CATH LAB;  Service: Cardiovascular;  Laterality: N/A;   RADIOACTIVE SEED GUIDED PARTIAL MASTECTOMY WITH AXILLARY SENTINEL LYMPH NODE BIOPSY Right 03/21/2015   Procedure:  RIGHT  PARTIAL MASTECTOMY WITH RADIOACTIVE SEED LOCALIZATION RIGHT  AXILLARY SENTINEL LYMPH NODE BIOPSY;  Surgeon: Claud Kelp, MD;  Location: [facility];  Service: General;  Laterality: Right;   REPLACEMENT TOTAL KNEE BILATERAL  2005 &2006   REVERSE SHOULDER ARTHROPLASTY Left 09/27/2023   Procedure: LEFT REVERSE SHOULDER ARTHROPLASTY;  Surgeon: Huel Cote, MD;  Location: [facility] OR;  Service: Orthopedics;  Laterality: Left;   VASCULAR SURGERY Right 03/15/2013   Ultrasound guided sclerotherapy   Social History   Socioeconomic History   Marital status: Widowed    Spouse name: [name]   Number of children: 3   Years of education: 14   Highest education level: Associate degree: occupational, Scientist, product/process development, or vocational program  Occupational History   Occupation: retired-CNA, Equities trader: RETIRED   Occupation: Engineer, site  Tobacco Use   Smoking status: Never   Smokeless tobacco: Never  Vaping Use   Vaping status: Never Used  Substance and Sexual Activity   Alcohol use: No    Alcohol/week: 0.0 standard drinks of alcohol   Drug use: No   Sexual activity: Not Currently    Birth control/protection: Post-menopausal  Other Topics Concern   Not on file  Social History Narrative  Emergency Contact: son, Madisan Bice [phone]   Patient lives is single story home with daughter, [name] and grandddaughter, [name]. Ramp to front door, 3 steps in back. Home has smoke detectors, no throw rugs. No grab bars in bathroom. No pets.   Diet: Pt has a varied diet.  Reports eating 2 meals a day.    Exercise: walks daily 15-20 mins; not as much due to possible irritable bowel; does exercise at church once weekly. Serves on [employer] at [employer] and as a Chief Strategy Officer at [employer].   Seatbelts: Pt reports wearing seatbelt when in vehicles. Does not drive.   Hobbies: word searches, church, time with family and friends, cooking, walking when she can; going out  shopping.   Drinks occasional soda; drinks a lot of water and some kool-aid; does drink tea      *Update as of 04/19/2019*   Patient has not able to walk around her neighborhood, or down her street, due to COVID. Patient is eager to get back to this once she feels "safe" to leave her house.    Social Drivers of Corporate investment banker Strain: Low Risk  (07/09/2023)   Overall Financial Resource Strain (CARDIA)    Difficulty of Paying Living Expenses: Not hard at all  Food Insecurity: No Food Insecurity (07/09/2023)   Hunger Vital Sign    Worried About Running Out of Food in the Last Year: Never true    Ran Out of Food in the Last Year: Never true  Transportation Needs: No Transportation Needs (07/09/2023)   PRAPARE - Administrator, Civil Service (Medical): No    Lack of Transportation (Non-Medical): No  Physical Activity: Insufficiently Active (07/09/2023)   Exercise Vital Sign    Days of Exercise per Week: 2 days    Minutes of Exercise per Session: 30 min  Stress: No Stress Concern Present (07/09/2023)   [employer] of Occupational Health - Occupational Stress Questionnaire    Feeling of Stress : Not at all  Social Connections: Moderately Integrated (07/09/2023)   Social Connection and Isolation Panel [NHANES]    Frequency of Communication with Friends and Family: More than three times a week    Frequency of Social Gatherings with Friends and Family: Three times a week    Attends Religious Services: More than 4 times per year    Active Member of Clubs or Organizations: Yes    Attends Banker Meetings: More than 4 times per year    Marital Status: Widowed   Family History  Problem Relation Age of Onset   Diabetes Mother    Stroke Mother    Heart disease Father    Anemia Father    Esophageal cancer Sister    Cancer Sister 17       nose cancer    Diabetes Sister        2 sisters type 1 diabetes    Kidney disease Sister        waiting for a  kidney transplant- on dialysis   Diabetes Brother        Type 2 diabetic    Diabetes Son        Type 2    Hypertension Son    Cancer Cousin 30       ovarian cancer    Cancer Cousin 49       ovarian cancer    Colon cancer Neg Hx    Rectal cancer Neg Hx  Stomach cancer Neg Hx    No Known Allergies Current Outpatient Medications  Medication Sig Dispense Refill   acetaminophen (TYLENOL) 650 MG CR tablet Take 650-1,300 mg by mouth See admin instructions. Take 1300 mg by mouth in the morning, 650 mg in the afternoon and 1300 mg at night     allopurinol (ZYLOPRIM) 100 MG tablet TAKE 1 TABLET BY MOUTH DAILY 100 tablet 2   aspirin EC 325 MG tablet Take 1 tablet (325 mg total) by mouth daily. 14 tablet 0   atorvastatin (LIPITOR) 40 MG tablet TAKE 1 TABLET BY MOUTH DAILY 100 tablet 2   blood glucose meter kit and supplies Dispense based on patient and insurance preference.Use to check blood sugar in the morning. (FOR ICD-10 E10.9, E11.9).  Lum Babe is what she has been using. 1 each 0   Blood Glucose Monitoring Suppl (ONETOUCH VERIO) w/Device KIT Use to check blood glucose every morning. E11.9 1 kit 0   cholecalciferol (VITAMIN D3) 25 MCG (1000 UNIT) tablet Take 2,000 Units by mouth daily.     colchicine 0.6 MG tablet TAKE 1 TABLET BY MOUTH DAILY (Patient taking differently: Take 0.6 mg by mouth at bedtime.) 100 tablet 2   dorzolamide (TRUSOPT) 2 % ophthalmic solution Place 1 drop into both eyes 2 (two) times daily.     fluticasone furoate-vilanterol (BREO ELLIPTA) 200-25 MCG/ACT AEPB Inhale 1 puff into the lungs daily. (Patient taking differently: Inhale 1 puff into the lungs at bedtime.) 60 each 3   gabapentin (NEURONTIN) 100 MG capsule TAKE 1 CAPSULE BY MOUTH AT  BEDTIME 100 capsule 2   glucose blood (ONETOUCH ULTRA) test strip USE TO CHECK BLOOD SUGAR EVERY MORNING 100 each 2   glucose blood (ONETOUCH VERIO) test strip Use as instructed 100 each 12   glucose blood (ONETOUCH VERIO)  test strip CHECK BLOOD SUGAR IN THE  MORNING AS DIRECTED 100 strip 3   Insulin Pen Needle (EASY COMFORT PEN NEEDLES) 31G X 8 MM MISC USE TO INJECT INSULIN EVERY DAY AS DIRECTED 100 each 3   JARDIANCE 25 MG TABS tablet TAKE 1 TABLET BY MOUTH DAILY 100 tablet 2   Lancet Devices (ONETOUCH DELICA PLUS LANCING) MISC USE ONCE DAILY 100 each 3   Lancets (ONETOUCH DELICA PLUS LANCET33G) MISC USE DAILY AS NEEDED. 100 each prn   LANTUS SOLOSTAR 100 UNIT/ML Solostar Pen INJECT 30 UNITS EACH MORNING     latanoprost (XALATAN) 0.005 % ophthalmic solution Place 1 drop into both eyes at bedtime.   0   losartan (COZAAR) 100 MG tablet TAKE 1 TABLET BY MOUTH AT  BEDTIME 100 tablet 2   metoprolol succinate (TOPROL-XL) 25 MG 24 hr tablet Take 12.5 mg by mouth daily.     ONETOUCH VERIO test strip CHECK BLOOD SUGAR IN THE MORNING AS DIRECTED 100 strip 2   Semaglutide, 2 MG/DOSE, (OZEMPIC, 2 MG/DOSE,) 8 MG/3ML SOPN Inject 2 mg into the skin once a week. 9 mL 0   spironolactone (ALDACTONE) 25 MG tablet TAKE 1 TABLET BY MOUTH DAILY 100 tablet 2   trolamine salicylate (ASPERCREME) 10 % cream Apply 1 Application topically as needed for muscle pain.     No current facility-administered medications for this visit.   No results found.  Review of Systems:   A ROS was performed including pertinent positives and negatives as documented in the HPI.   Musculoskeletal Exam:    There were no vitals taken for this visit.  Left shoulder incision is well-appearing without  erythema or drainage.  Sensation is intact in axillary distribution equal to the contralateral side.  She does fire all 3 heads of the deltoid with forward elevation.  Forward elevation is to 20 degrees actively with external rotation of the side to neutral.  Imaging:    2 views left shoulder: Status post reverse shoulder arthroplasty without evidence of complication  I personally reviewed and interpreted the radiographs.   Assessment:   12-week status  post reverse shoulder arthroplasty without complication.  Her pain is continuing to improve.  She is now sleeping better at night.  She is not having any issues.  I do believe she may have plateaued with physical therapy.  I will plan to see her back in 12 weeks for final check  Plan :    -Return to clinic 12 weeks for reassessment      I personally saw and evaluated the patient, and participated in the management and treatment plan.  Huel Cote, MD Attending Physician, Orthopedic Surgery  This document was dictated using Dragon voice recognition software. A reasonable attempt at proof reading has been made to minimize errors.

## 2023-12-27 DIAGNOSIS — H401113 Primary open-angle glaucoma, right eye, severe stage: Secondary | ICD-10-CM | POA: Diagnosis not present

## 2023-12-27 DIAGNOSIS — H401122 Primary open-angle glaucoma, left eye, moderate stage: Secondary | ICD-10-CM | POA: Diagnosis not present

## 2023-12-29 ENCOUNTER — Ambulatory Visit: Payer: 59

## 2023-12-29 DIAGNOSIS — Z96612 Presence of left artificial shoulder joint: Secondary | ICD-10-CM

## 2023-12-29 DIAGNOSIS — G8929 Other chronic pain: Secondary | ICD-10-CM

## 2023-12-29 DIAGNOSIS — M25512 Pain in left shoulder: Secondary | ICD-10-CM | POA: Diagnosis not present

## 2023-12-29 DIAGNOSIS — M6281 Muscle weakness (generalized): Secondary | ICD-10-CM | POA: Diagnosis not present

## 2023-12-29 NOTE — Therapy (Signed)
OUTPATIENT PHYSICAL THERAPY TREATMENT NOTE   Patient Name: [name] MRN: [id number] DOB:January 05, 1951, 73 y.o., female Today's Date: 12/29/2023  END OF SESSION:  PT End of Session - 12/29/23 1119     Visit Number 14    Number of Visits 16    Date for PT Re-Evaluation 12/19/23    Authorization Type [employer] MCR    PT Start Time 1130    PT Stop Time 1200    PT Time Calculation (min) 30 min    Activity Tolerance Patient limited by pain;Patient tolerated treatment well    Behavior During Therapy [facility] for tasks assessed/performed             Past Medical History:  Diagnosis Date   Anemia    Arthritis    Asthma    Blood transfusion without reported diagnosis    Patients believes 2015 when she overdosed on "medications"    Breast cancer of lower-outer quadrant of right female breast (HCC) 02/13/2015   Cataract    CHF, acute (HCC) 05/03/2012   Depression    Diabetes mellitus    type 2   Eczema    Fatty liver    Fatty infiltration of liver noted on 03/2012 CT scan   Fibromyalgia    Glaucoma    bilateral   Heart disease    History of kidney stones    w/ hx of hydronephrosis - followed by [facility]   HIV nonspecific serology    2006: indeterminate HIV blood test, seen by ID, felt secondary to cross reacting antibodies with no further workup felt necessary at that time    Hyperlipidemia    Hypertension    Obesity    Personal history of radiation therapy 2016   right   Radiation 05/07/15-06/23/15   Right Breast   Past Surgical History:  Procedure Laterality Date   BREAST BIOPSY Left 02/10/2015   malignant    BREAST LUMPECTOMY Right 03/21/2015   CHOLECYSTECTOMY  2003   CYSTOSCOPY W/ LITHOLAPAXY / E[facility]     LEFT AND RIGHT HEART CATHETERIZATION WITH CORONARY ANGIOGRAM N/A 05/02/2012   Procedure: LEFT AND RIGHT HEART CATHETERIZATION WITH CORONARY ANGIOGRAM;  Surgeon: Kathleene Hazel, MD;  Location: [facility] CATH LAB;  Service: Cardiovascular;  Laterality: N/A;    RADIOACTIVE SEED GUIDED PARTIAL MASTECTOMY WITH AXILLARY SENTINEL LYMPH NODE BIOPSY Right 03/21/2015   Procedure: RIGHT  PARTIAL MASTECTOMY WITH RADIOACTIVE SEED LOCALIZATION RIGHT  AXILLARY SENTINEL LYMPH NODE BIOPSY;  Surgeon: Claud Kelp, MD;  Location: [facility];  Service: General;  Laterality: Right;   REPLACEMENT TOTAL KNEE BILATERAL  2005 &2006   REVERSE SHOULDER ARTHROPLASTY Left 09/27/2023   Procedure: LEFT REVERSE SHOULDER ARTHROPLASTY;  Surgeon: Huel Cote, MD;  Location: [facility] OR;  Service: Orthopedics;  Laterality: Left;   VASCULAR SURGERY Right 03/15/2013   Ultrasound guided sclerotherapy   Patient Active Problem List   Diagnosis Date Noted   Primary osteoarthritis, left shoulder 09/27/2023   Renal disease due to diabetes mellitus (HCC) 04/28/2023   Left shoulder pain 09/06/2022   Tinea pedis 04/09/2020   Spinal stenosis of lumbar region without neurogenic claudication 02/08/2019   Gout 05/16/2018   Gait instability 01/03/2018   Tremor of both hands    Stuttering    Essential hypertension    Hyperlipidemia 08/13/2015   Breast cancer of lower-outer quadrant of right female breast (HCC) 02/13/2015   Type 2 diabetes mellitus with diabetic foot deformity (HCC) 01/01/2014   Chronic combined systolic and diastolic heart  failure (HCC) 02/26/2013   OBESITY 02/05/2009   Asthma, intermittent 02/08/2008   Neuropathy due to secondary diabetes (HCC) 05/18/2007   NEPHROLITHIASIS 01/26/2007   DJD (degenerative joint disease), multiple sites 01/26/2007    PCP: Carney Living, MD   REFERRING PROVIDER: Huel Cote, MD  REFERRING DIAG: [id number] (ICD-10-CM) - Primary osteoarthritis, left shoulder  THERAPY DIAG:  Chronic left shoulder pain  Status post reverse total replacement of left shoulder  Muscle weakness (generalized)  Rationale for Evaluation and Treatment: Rehabilitation  ONSET DATE: 09/22/23  Next MD appt: 12/23/23 Dr.  Steward Drone  SUBJECTIVE:                                                                                                                                                                                      SUBJECTIVE STATEMENT:  Patient reports that she saw her surgeon who was please with her progress and stated the should has healed well. She is not having much pain this morning, only reports with certain motions.   Hand dominance: Right  PERTINENT HISTORY: 2-week status post reverse shoulder arthroplasty without complication. This time I would like her to work particular with outpatient physical therapy to work on regaining her forward flexion and abduction as this is quite limited. She will continue to work on this. I will plan to see him back in 4 weeks for reassessment  PAIN:  Are you having pain? Yes: NPRS scale: 9/10 Pain location: L shoulder Pain description: ache Aggravating factors: activity and weightbearing posiitons Relieving factors: rest and meds    PRECAUTIONS: Shoulder  RED FLAGS: None   WEIGHT BEARING RESTRICTIONS: No  FALLS:  Has patient fallen in last 6 months? No  OCCUPATION: retired  PLOF: Independent with basic ADLs  PATIENT GOALS:To move my shoulder again  NEXT MD VISIT:   OBJECTIVE:  Note: Objective measures were completed at Evaluation unless otherwise noted.  DIAGNOSTIC FINDINGS:  none  PATIENT SURVEYS:  FOTO 38(59 predicted)  12/08/23 49%  POSTURE: Forward and rounded shoulders   UPPER EXTREMITY ROM:   PROM Right eval Left eval L 11/16/23 L 12/01/23 L 12/19/23  Shoulder flexion  60d 110d 110d 40/110d  Shoulder extension  30d     Shoulder scaption   60d 85d 100d 45/80d  Shoulder abduction   85d    Shoulder internal rotation       Shoulder external rotation   28d  10/20d  Elbow flexion  WFL     Elbow extension  WFL     Wrist flexion       Wrist extension       Wrist ulnar deviation  Wrist radial deviation       Wrist  pronation       Wrist supination       (Blank rows = not tested)  UPPER EXTREMITY MMT: Deferred post-op  MMT Right eval Left eval L 12/19/23  Shoulder flexion   3  Shoulder extension   3+  Shoulder abduction   3  Shoulder adduction     Shoulder internal rotation   3  Shoulder external rotation   2  Middle trapezius     Lower trapezius     Elbow flexion     Elbow extension     Wrist flexion     Wrist extension     Wrist ulnar deviation     Wrist radial deviation     Wrist pronation     Wrist supination     Grip strength (lbs)     (Blank rows = not tested)  SHOULDER SPECIAL TESTS: Deferred post-op  JOINT MOBILITY TESTING:  Deferred post op  PALPATION:  deferred   TODAY'S TREATMENT:   [facility] Adult PT Treatment:                                                DATE: 12/29/23 Therapeutic Exercise: Nustep L4 8 min Supine chest press 500g ball 30x L flexion in supine 500g ball 30x R S/L abduction 500g ball 30x R S/L ER no weight 2x10 Supine flexion with UE Ranger 30x Supine press with UE Ranger 30x   [facility] Adult PT Treatment:                                                DATE: 12/19/23 Therapeutic Exercise: Nustep L4 8 min Supine chest press 500g ball 30x L flexion in supine 500g ball 30x R S/L abduction 500g ball 30x Supine flexion with UERanger 30x Supine press with UERanger 30x ER walkouts YTB 3 steps x5  [facility] Adult PT Treatment:                                                DATE: 12/15/23 Therapeutic Exercise: Nustep L4 8 min Supine chest press 500g ball 30x L flexion in supine 500g ball 30x R S/L abduction 500g ball 30x Supine flexion with UERanger 30x Supine press with UERanger 30x Flexion on wall with towel 15x Scaption on wall with towel 15x Manual Therapy: PROM 20d ER, 80d ABD, 110d FF                                                    PATIENT EDUCATION: Education details: Discussed eval findings, rehab rationale and POC and patient is in  agreement  Person educated: Patient Education method: Explanation Education comprehension: verbalized understanding and needs further education  HOME EXERCISE PROGRAM: Access Code: [facility] URL: [email or web address] Date: 12/05/2023 Prepared by: Gustavus Bryant  Exercises - Seated Shoulder Shrugs  - 5 x daily - 7 x weekly - 10 reps -  Seated Scapular Retraction  - 5 x daily - 7 x weekly - 10 reps - Supine Shoulder Flexion with Dowel  - 2 x daily - 5 x weekly - 3 sets - 10 reps - 2s hold - Supine Shoulder Press with Dowel  - 2 x daily - 5 x weekly - 3 sets - 10 reps - 3s hold - Sidelying Shoulder Abduction Palm Forward  - 2 x daily - 5 x weekly - 3 sets - 10 reps - 3s hold - Standing Single Arm Shoulder Flexion with Posterior Anchored Resistance  - 2 x daily - 5 x weekly - 3 sets - 10 reps - Single Arm Shoulder Extension with Anchored Resistance  - 2 x daily - 5 x weekly - 3 sets - 10 reps  ASSESSMENT:  CLINICAL IMPRESSION:  Patient presents to PT reporting minimal pain in the shoulder and states that her surgeon is please with her progress and she will return to him in 3 months. Session today continued to focus on AAROM and AROM of Lt shoulder to maximize gains at this point in therapy. Patient was able to tolerate all prescribed exercises with no adverse effects. Patient continues to benefit from skilled PT services and should be progressed as able to improve functional independence.     Patient is a 73 y.o. female who was seen today for physical therapy evaluation and treatment for L shoulder weakness, pain and loss of motion following RTSA. Patient arrived for session holding purse in L hand and advised to avoid in future.  Patient demonstrates decreased L shoulder A/PROM along with strength deficits following surgical procedure.  Patient is good candidate for OPPT.  OBJECTIVE IMPAIRMENTS: decreased activity tolerance, decreased knowledge of condition, decreased mobility,  decreased ROM, decreased strength, impaired perceived functional ability, impaired UE functional use, and pain.   ACTIVITY LIMITATIONS: carrying, lifting, sleeping, bed mobility, and dressing  PERSONAL FACTORS: Age, Fitness, Past/current experiences, Time since onset of injury/illness/exacerbation, and 1-2 comorbidities: Asthma CHF  are also affecting patient's functional outcome.   REHAB POTENTIAL: Good  CLINICAL DECISION MAKING: Stable/uncomplicated  EVALUATION COMPLEXITY: Low   GOALS: Goals reviewed with patient? No  SHORT TERM GOALS: Target date: 11/16/2023    Patient to demonstrate independence in HEP  Baseline: [id number] Goal status: Met  2.  Increase PROM to 90d flexion and scaption Baseline:  PROM Right eval Left eval L 11/16/23 L 12/01/23  Shoulder flexion  60d 110d 110d  Shoulder extension  30d    Shoulder scaption   60d 85d 100d   Goal status: Met  3.  6/10 worst pain level Baseline: 9/10; 12/08/23 7/10 Goal status: Progressing   LONG TERM GOALS: Target date: 12/19/2023    Patient to demonstrate independence in HEP  Baseline: [id number] Goal status: Met  2.  Patient will score at least 59% on FOTO to signify clinically meaningful improvement in functional abilities.   Baseline: 38%; 12/08/23 49% Goal status: Ongoing  3.  Patient will acknowledge 4/10 pain at least once during episode of care   Baseline: 9/10; 12/08/23 7/10 Goal status: Ongoing  4.  Patient will be able to reach [facility] to allow more participation in ADLs Baseline: AROM limited to below shoulder height.:12/08/23 FF to 100d Goal status: Not met   PLAN:  PT FREQUENCY: 1x/week  PT DURATION: 2 weeks  PLANNED INTERVENTIONS: 97164- PT Re-evaluation, 97110-Therapeutic exercises, 97530- Therapeutic activity, [id number]- Neuromuscular re-education, 97535- Self Care, 16109- Manual therapy, Patient/Family education, and DME instructions  PLAN FOR  NEXT SESSION: HEP review and update, manual techniques as  appropriate, aerobic tasks, ROM and flexibility activities, strengthening and PREs, TPDN, gait and balance training as needed    Date of referral: 08/17/23 Referring provider: Huel Cote, MD Referring diagnosis? M19.012 (ICD-10-CM) - Primary osteoarthritis, left shoulder Treatment diagnosis? (if different than referring diagnosis) M19.012 (ICD-10-CM) - Primary osteoarthritis, left shoulder  What was this (referring dx) caused by? Arthritis  Nature of Condition: Chronic (continuous duration > 3 months)   Laterality: Lt  Current Functional Measure Score: FOTO 38%  Objective measurements identify impairments when they are compared to normal values, the uninvolved extremity, and prior level of function.  [x]  Yes  []  No  Objective assessment of functional ability: Severe functional limitations   Briefly describe symptoms: L shoulder pain and soreness restricting ROM and function  How did symptoms start: degenerative condition  Average pain intensity:  Last 24 hours: 9  Past week: 9  How often does the pt experience symptoms? Frequently  How much have the symptoms interfered with usual daily activities? Extremely  How has condition changed since care began at this facility? NA - initial visit  In general, how is the patients overall health? Fair   BACK PAIN (STarT Back Screening Tool) No   Berta Minor, PTA 12/29/2023, 12:07 PM   REVERSE SHOULDER ARTHROPLASTY REHABILITATION GUIDELINES These guidelines do NOT apply to RTSA for proximal humeral fracture - rehabilitation following PHFx will commence 4-6 weeks after surgery as deemed appropriate to protect tuberosity healing PHASE PRECAUTIONS AND GUIDELINES GOALS EXERCISES CRITERIA TO ADVANCE  EXAMINATION  1 (Post-operative day 1 to post-operative week 3) Sling 24/7 (remove for grooming and home exercise program 3-5x/day)  Avoid combined IR/EXT/ADD (hand behind the back) and IR/ADD (reaching across chest) for  dislocation precautions  Pillow behind the upper arm while reclining with sling on   Patient should always be able to see the elbow  Avoid WBing - discuss WBing need with physician and PT  No submersion in water until after 4 weeks  Ice after HEP as needed  Maintain integrity of joint replacement; protect soft tissue healing  ROM for elevation to 130 and ER to 30   Optimize distal UE circulation and muscle activity (elbow, wrist and hand)  Instruct in use of sling for proper fit  Educate regarding signs/symptoms of infection    Active elbow, wrist and hand  Pendulum  Scapular retraction with arms resting in neutral position  Forward elevation in scapular plane to 130 deg max motion (table slides, step backs, supine well arm assisted)  ER in scapular plane to 30 deg (seated or supine)  ROM within precautionary range limits may be active or passive   Pain less than 3/10 with ROM  Healing incision without signs of infection  Clearance by surgeon to advance after 2 week post-operative visit Wound assessment  Swelling assessment of upper extremity  Neurovascular assessment of upper extremity  Sling fit and ability to don/doff properly  Patient reported outcome measure  Pain level  Range of motion for elevation and ER   PHASE PRECAUTIONS AND GUIDELINES GOALS EXERCISES CRITERIA TO ADVANCE  EXAMINATION  2 (Post-operative week 3 to 6) May discontinue sling use at 3 weeks; after 2 weeks can remove the sling at home and just use the sling at night and in community for 3rd week  May use arm for basic activities of daily living (such as feeding, brushing teeth, dressing.)   May submerge in water after 4 weeks  Avoid combined IR/EXT/ADD (hand behind the back) and IR/ADD (reaching across chest) for dislocation precautions  Avoid acromial or scapular spine pain as increase deltoid loading - decrease load if this occurs  Elevation to 130 deg and ER to 30 deg - passive,  active assisted or active  Low (less than 3/10) to no pain  Ability to fire all heads of the deltoid May discontinue grip, and active elbow and wrist exercises since using the arm in ADLs with sling removed around the home  Continue elevation to 130 and ER to 30, both in scapular plane   Submaximal isometrics (pain-free effort) for all functional heads of deltoid (anterior, posterior, middle).   Active exercise as able:  Supine forward punch  Place in balanced position with circumduction and progressive arcs in sagittal plane  Side-lying abduction to 90   Lateral raise with bent elbow   Prone extension to hip  Elevation in scapular plane to 130; ER in scapular plane to 30   Ability to fire isometrically all heads of the deltoid muscle without pain  Ability to place and hold the arm in balanced position (90 deg elevation in supine) Wound assessment  Neurovascular assessment  Swelling assessment  ROM shoulder elevation and ER(0)  Patient reported outcome measure  Pain level   PHASE PRECAUTIONS AND GUIDELINES GOALS EXERCISES CRITERIA TO ADVANCE  EXAMINATION  3 (Post-operative week 6 to 12) Avoid forceful end-range motion in any direction   Progress active use of the arm in ADLs without being restricted to arm by the side of the body;   No heavy lifting or carrying  Initiate functional IR behind the back gently without forceful overpressure  Avoid acromial or scapular spine pain as increase deltoid loading - decrease load if this occurs  NO UPPER BODY ERGOMETER  Optimize ROM for elevation and ER in scapular plane   Expected PROM: Elevation - 145-160; ER - 40-50 ; functional IR to L1  Recover AROM to approach as close to PROM available as possible  Establish dynamic stability of the shoulder  Forward elevation in scapular plane active progression: supine to incline to vertical; short to long lever arm  Lateral raise with bent elbow; side-lying abduction  Active  ER/IR with arm at side  Scapular retraction with light band resistance  Serratus anterior punches in supine; avoid wall, incline or prone press-ups for serratus anterior  Functional IR with hand slide up back - very gentle and gradual   AROM equals/approaches PROM with good mechanics for elevation  No pain  Higher level demand on shoulder than ADL functions PROM for elevation, ER(0)  AROM for elevation, ER(0) and functional IR  Patient reported outcome measure  Pain             PHASE PRECAUTIONS AND GUIDELINES GOALS EXERCISES CRITERIA TO ADVANCE  EXAMINATION  4 (Post-operative week 12+) No heavy lifting and no overhead sports  Weight lifting limit 25.lb  No heavy pushing activity  Gradually increase strength   NO UPPER BODY ERGOMETER  Optimize functional use of operative UE to patient specific goals  Gradual increase in deltoid, scapular muscle and rotator cuff strength  Pain-free functional activities Light hand weights for deltoid up to and not to exceed 3 lbs for anterior and posterior with long arm lift against gravity; elbow bent to 90 deg for abduction in scapular plane  Band progression for extension to hip with scapular depression/retraction  Band progression for serratus anterior punches in supine; avoid wall, incline or  prone press-ups for serratus anterior  End-range stretching gently without forceful overpressure in all planes (elevation in scapular plane, ER in scapular plane, functional IR) with stretching done for life as part of daily routine  NO UPPER BODY ERGOMETER  Pain-free AROM for shoulder elevation (expect around 135-150 deg)  Functional strength for all ADLs, work tasks, and hobbies approved by [employer], television/film set with home maintenance program PROM for elevation, ER(0); ER(90)  AROM for elevation, ER(0) and functional IR  Scapulohumeral rhythm/biomechanics of active movement strategies  Strength testing for deltoid, RTC, scapular  muscles  Patient reported outcome measure  Pain

## 2024-01-02 ENCOUNTER — Inpatient Hospital Stay: Payer: 59 | Admitting: Nurse Practitioner

## 2024-01-02 ENCOUNTER — Inpatient Hospital Stay: Payer: 59 | Attending: Family Medicine

## 2024-01-02 ENCOUNTER — Other Ambulatory Visit: Payer: Self-pay | Admitting: Nurse Practitioner

## 2024-01-02 ENCOUNTER — Telehealth: Payer: Self-pay

## 2024-01-02 ENCOUNTER — Other Ambulatory Visit: Payer: Self-pay

## 2024-01-02 DIAGNOSIS — C50511 Malignant neoplasm of lower-outer quadrant of right female breast: Secondary | ICD-10-CM

## 2024-01-02 NOTE — Therapy (Signed)
 OUTPATIENT PHYSICAL THERAPY TREATMENT NOTE   Patient Name: [name] MRN: [id number] DOB:[birth year], 73 y.o., female Today's Date: 01/04/2024  END OF SESSION:  PT End of Session - 01/04/24 1012     Visit Number --    Number of Visits --    Date for PT Re-Evaluation --    Authorization Type --    PT Start Time --    PT Stop Time --    PT Time Calculation (min) --    Activity Tolerance --    Behavior During Therapy --              Past Medical History:  Diagnosis Date   Anemia    Arthritis    Asthma    Blood transfusion without reported diagnosis    Patients believes 2015 when she overdosed on medications    Breast cancer of lower-outer quadrant of right female breast (HCC) 02/13/2015   Cataract    CHF, acute (HCC) 05/03/2012   Depression    Diabetes mellitus    type 2   Eczema    Fatty liver    Fatty infiltration of liver noted on 03/2012 CT scan   Fibromyalgia    Glaucoma    bilateral   Heart disease    History of kidney stones    w/ hx of hydronephrosis - followed by [facility]   HIV nonspecific serology    2006: indeterminate HIV blood test, seen by ID, felt secondary to cross reacting antibodies with no further workup felt necessary at that time    Hyperlipidemia    Hypertension    Obesity    Personal history of radiation therapy 2016   right   Radiation 05/07/15-06/23/15   Right Breast   Past Surgical History:  Procedure Laterality Date   BREAST BIOPSY Left 02/10/2015   malignant    BREAST LUMPECTOMY Right 03/21/2015   CHOLECYSTECTOMY  2003   CYSTOSCOPY W/ LITHOLAPAXY / [facility]     LEFT AND RIGHT HEART CATHETERIZATION WITH CORONARY ANGIOGRAM N/A 05/02/2012   Procedure: LEFT AND RIGHT HEART CATHETERIZATION WITH CORONARY ANGIOGRAM;  Surgeon: Lonni JONETTA Cash, MD;  Location: [facility] CATH LAB;  Service: Cardiovascular;  Laterality: N/A;   RADIOACTIVE SEED GUIDED PARTIAL MASTECTOMY WITH AXILLARY SENTINEL LYMPH NODE BIOPSY Right 03/21/2015    Procedure: RIGHT  PARTIAL MASTECTOMY WITH RADIOACTIVE SEED LOCALIZATION RIGHT  AXILLARY SENTINEL LYMPH NODE BIOPSY;  Surgeon: Elon Pacini, MD;  Location: [facility];  Service: General;  Laterality: Right;   REPLACEMENT TOTAL KNEE BILATERAL  2005 &2006   REVERSE SHOULDER ARTHROPLASTY Left 09/27/2023   Procedure: LEFT REVERSE SHOULDER ARTHROPLASTY;  Surgeon: Genelle Standing, MD;  Location: [facility] OR;  Service: Orthopedics;  Laterality: Left;   VASCULAR SURGERY Right 03/15/2013   Ultrasound guided sclerotherapy   Patient Active Problem List   Diagnosis Date Noted   Primary osteoarthritis, left shoulder 09/27/2023   Renal disease due to diabetes mellitus (HCC) 04/28/2023   Left shoulder pain 09/06/2022   Tinea pedis 04/09/2020   Spinal stenosis of lumbar region without neurogenic claudication 02/08/2019   Gout 05/16/2018   Gait instability 01/03/2018   Tremor of both hands    Stuttering    Essential hypertension    Hyperlipidemia 08/13/2015   Breast cancer of lower-outer quadrant of right female breast (HCC) 02/13/2015   Type 2 diabetes mellitus with diabetic foot deformity (HCC) 01/01/2014   Chronic combined systolic and diastolic heart failure (HCC) 02/26/2013   OBESITY 02/05/2009   Asthma,  intermittent 02/08/2008   Neuropathy due to secondary diabetes (HCC) 05/18/2007   NEPHROLITHIASIS 01/26/2007   DJD (degenerative joint disease), multiple sites 01/26/2007    PCP: Jeanelle Layman CROME, MD   REFERRING PROVIDER: Genelle Standing, MD  REFERRING DIAG: [id number] (ICD-10-CM) - Primary osteoarthritis, left shoulder  THERAPY DIAG:  Chronic left shoulder pain  Status post reverse total replacement of left shoulder  Muscle weakness (generalized)  Rationale for Evaluation and Treatment: Rehabilitation  ONSET DATE: 09/22/23  Next MD appt: 12/23/23 Dr. Genelle  SUBJECTIVE:                                                                                                                                                                                       SUBJECTIVE STATEMENT:  Patient reports that she saw her surgeon who was please with her progress and stated the should has healed well. She is not having much pain this morning, only reports with certain motions.   Hand dominance: Right  PERTINENT HISTORY: 2-week status post reverse shoulder arthroplasty without complication. This time I would like her to work particular with outpatient physical therapy to work on regaining her forward flexion and abduction as this is quite limited. She will continue to work on this. I will plan to see him back in 4 weeks for reassessment  PAIN: 01/04/24 4-6/01 pain and discomfort Are you having pain? Yes: NPRS scale: 9/10 Pain location: L shoulder Pain description: ache Aggravating factors: activity and weightbearing posiitons Relieving factors: rest and meds    PRECAUTIONS: Shoulder  RED FLAGS: None   WEIGHT BEARING RESTRICTIONS: No  FALLS:  Has patient fallen in last 6 months? No  OCCUPATION: retired  PLOF: Independent with basic ADLs  PATIENT GOALS:To move my shoulder again  NEXT MD VISIT:   OBJECTIVE:  Note: Objective measures were completed at Evaluation unless otherwise noted.  DIAGNOSTIC FINDINGS:  none  PATIENT SURVEYS:  FOTO 38(59 predicted)  12/08/23 49% 01/04/24 70%  POSTURE: Forward and rounded shoulders   UPPER EXTREMITY ROM:   PROM Right eval Left eval L 11/16/23 L 12/01/23 L 12/19/23  Shoulder flexion  60d 110d 110d 40/110d  Shoulder extension  30d     Shoulder scaption   60d 85d 100d 45/80d  Shoulder abduction   85d    Shoulder internal rotation       Shoulder external rotation   28d  10/20d  Elbow flexion  WFL     Elbow extension  WFL     Wrist flexion       Wrist extension       Wrist ulnar deviation       Wrist radial deviation  Wrist pronation       Wrist supination       (Blank rows = not tested)  UPPER EXTREMITY  MMT: Deferred post-op  MMT Right eval Left eval L 12/19/23  Shoulder flexion   3  Shoulder extension   3+  Shoulder abduction   3  Shoulder adduction     Shoulder internal rotation   3  Shoulder external rotation   2  Middle trapezius     Lower trapezius     Elbow flexion     Elbow extension     Wrist flexion     Wrist extension     Wrist ulnar deviation     Wrist radial deviation     Wrist pronation     Wrist supination     Grip strength (lbs)     (Blank rows = not tested)  SHOULDER SPECIAL TESTS: Deferred post-op  JOINT MOBILITY TESTING:  Deferred post op  PALPATION:  deferred   TODAY'S TREATMENT:   [facility] Adult PT Treatment:                                                DATE: 01/04/24 Therapeutic Exercise: Nustep L4 8 min Supine flexion AAROM 15x Supine chest press AAROM 15x  Therapeutic Activity: AAROM seated flexion with stick 15x Lat pulldown over door frame RTB 15x  Self Care: Todays session focused on assessment of function, ROM, strength and establishing a self care routine to address remaining deficits in L shoulder.  Discussion of activities and tasks to focus on as well as relevant time frame for healing following TSR as patient is currently 3 months post op    [facility] Adult PT Treatment:                                                DATE: 12/29/23 Therapeutic Exercise: Nustep L4 8 min Supine chest press 500g ball 30x L flexion in supine 500g ball 30x R S/L abduction 500g ball 30x R S/L ER no weight 2x10 Supine flexion with UE Ranger 30x Supine press with UE Ranger 30x   [facility] Adult PT Treatment:                                                DATE: 12/19/23 Therapeutic Exercise: Nustep L4 8 min Supine chest press 500g ball 30x L flexion in supine 500g ball 30x R S/L abduction 500g ball 30x Supine flexion with UERanger 30x Supine press with UERanger 30x ER walkouts YTB 3 steps x5  [facility] Adult PT Treatment:                                                 DATE: 12/15/23 Therapeutic Exercise: Nustep L4 8 min Supine chest press 500g ball 30x L flexion in supine 500g ball 30x R S/L abduction 500g ball 30x Supine flexion with UERanger 30x Supine press with UERanger 30x Flexion on wall with towel 15x  Scaption on wall with towel 15x Manual Therapy: PROM 20d ER, 80d ABD, 110d FF                                                    PATIENT EDUCATION: Education details: Discussed eval findings, rehab rationale and POC and patient is in agreement  Person educated: Patient Education method: Explanation Education comprehension: verbalized understanding and needs further education  HOME EXERCISE PROGRAM: Access Code: [facility] URL: [email or web address] Date: 01/04/2024 Prepared by: Reyes Kohut  Exercises - Supine Shoulder Flexion with Dowel  - 1 x daily - 5 x weekly - 1 sets - 15 reps - 2s hold - Supine Shoulder Press with Dowel  - 1 x daily - 5 x weekly - 1 sets - 15 reps - 2s hold - Standing Overhead Press with Bar  - 1 x daily - 5 x weekly - 1 sets - 15 reps - 2s hold - Shoulder Lat Pull Down with Resistance Band with PLB  - 1 x daily - 5 x weekly - 1 sets - 15 reps - 2s hold  ASSESSMENT:  CLINICAL IMPRESSION: Todays session focused on assessment of function, ROM, strength and establishing a self care routine to address remaining deficits in L shoulder.  Discussion of activities and tasks to focus on as well as relevant time frame for healing following TSR as patient is currently 3 months post op.  Finalized HEP to address deficits in ROM, strength and function.  Patient has reached a plateau in rehab.     Patient is a 73 y.o. female who was seen today for physical therapy evaluation and treatment for L shoulder weakness, pain and loss of motion following RTSA. Patient arrived for session holding purse in L hand and advised to avoid in future.  Patient demonstrates decreased L shoulder A/PROM along with strength  deficits following surgical procedure.  Patient is good candidate for OPPT.  OBJECTIVE IMPAIRMENTS: decreased activity tolerance, decreased knowledge of condition, decreased mobility, decreased ROM, decreased strength, impaired perceived functional ability, impaired UE functional use, and pain.   ACTIVITY LIMITATIONS: carrying, lifting, sleeping, bed mobility, and dressing  PERSONAL FACTORS: Age, Fitness, Past/current experiences, Time since onset of injury/illness/exacerbation, and 1-2 comorbidities: Asthma CHF  are also affecting patient's functional outcome.   REHAB POTENTIAL: Good  CLINICAL DECISION MAKING: Stable/uncomplicated  EVALUATION COMPLEXITY: Low   GOALS: Goals reviewed with patient? No  SHORT TERM GOALS: Target date: 11/16/2023    Patient to demonstrate independence in HEP  Baseline: [id number] Goal status: Met  2.  Increase PROM to 90d flexion and scaption Baseline:  PROM Right eval Left eval L 11/16/23 L 12/01/23  Shoulder flexion  60d 110d 110d  Shoulder extension  30d    Shoulder scaption   60d 85d 100d   Goal status: Met  3.  6/10 worst pain level Baseline: 9/10; 12/08/23 7/10; 01/04/24 4-6/10 pain range Goal status: Met   LONG TERM GOALS: Target date: 01/19/2024    Patient to demonstrate independence in HEP  Baseline: [id number] Goal status: Met  2.  Patient will score at least 59% on FOTO to signify clinically meaningful improvement in functional abilities.   Baseline: 38%; 12/08/23 49%; 01/04/24 70%  Goal status: Met  3.  Patient will acknowledge 4/10 pain at least once during episode of care   Baseline: 9/10; 12/08/23  7/10; 01/04/24 4-6/10 pain range Goal status: Met  4.  Patient will be able to reach [facility] to allow more participation in ADLs Baseline: AROM limited to below shoulder height.:12/08/23 FF to 100d Goal status: Met   PLAN:  PT FREQUENCY: 1x/week  PT DURATION: 2 weeks  PLANNED INTERVENTIONS: 97164- PT Re-evaluation, 97110-Therapeutic  exercises, 97530- Therapeutic activity, 97112- Neuromuscular re-education, 97535- Self Care, [id number]- Manual therapy, Patient/Family education, and DME instructions  PLAN FOR NEXT SESSION: HEP review and update, manual techniques as appropriate, aerobic tasks, ROM and flexibility activities, strengthening and PREs, TPDN, gait and balance training as needed    Date of referral: 08/17/23 Referring provider: Genelle Standing, MD Referring diagnosis? M19.012 (ICD-10-CM) - Primary osteoarthritis, left shoulder Treatment diagnosis? (if different than referring diagnosis) M19.012 (ICD-10-CM) - Primary osteoarthritis, left shoulder  What was this (referring dx) caused by? Arthritis  Nature of Condition: Chronic (continuous duration > 3 months)   Laterality: Lt  Current Functional Measure Score: FOTO 38%  Objective measurements identify impairments when they are compared to normal values, the uninvolved extremity, and prior level of function.  [x]  Yes  []  No  Objective assessment of functional ability: Severe functional limitations   Briefly describe symptoms: L shoulder pain and soreness restricting ROM and function  How did symptoms start: degenerative condition  Average pain intensity:  Last 24 hours: 9  Past week: 9  How often does the pt experience symptoms? Frequently  How much have the symptoms interfered with usual daily activities? Extremely  How has condition changed since care began at this facility? NA - initial visit  In general, how is the patients overall health? Fair   BACK PAIN (STarT Back Screening Tool) No   Reyes CHRISTELLA Kohut, PT 01/04/2024, 10:14 AM   REVERSE SHOULDER ARTHROPLASTY REHABILITATION GUIDELINES These guidelines do NOT apply to RTSA for proximal humeral fracture - rehabilitation following PHFx will commence 4-6 weeks after surgery as deemed appropriate to protect tuberosity healing PHASE PRECAUTIONS AND GUIDELINES GOALS EXERCISES CRITERIA TO ADVANCE   EXAMINATION  1 (Post-operative day 1 to post-operative week 3) Sling 24/7 (remove for grooming and home exercise program 3-5x/day)  Avoid combined IR/EXT/ADD (hand behind the back) and IR/ADD (reaching across chest) for dislocation precautions  Pillow behind the upper arm while reclining with sling on   Patient should always be able to see the elbow  Avoid WBing - discuss WBing need with physician and PT  No submersion in water until after 4 weeks  Ice after HEP as needed  Maintain integrity of joint replacement; protect soft tissue healing  ROM for elevation to 130 and ER to 30   Optimize distal UE circulation and muscle activity (elbow, wrist and hand)  Instruct in use of sling for proper fit  Educate regarding signs/symptoms of infection    Active elbow, wrist and hand  Pendulum  Scapular retraction with arms resting in neutral position  Forward elevation in scapular plane to 130 deg max motion (table slides, step backs, supine well arm assisted)  ER in scapular plane to 30 deg (seated or supine)  ROM within precautionary range limits may be active or passive   Pain less than 3/10 with ROM  Healing incision without signs of infection  Clearance by surgeon to advance after 2 week post-operative visit Wound assessment  Swelling assessment of upper extremity  Neurovascular assessment of upper extremity  Sling fit and ability to don/doff properly  Patient reported outcome measure  Pain level  Range of motion  for elevation and ER   PHASE PRECAUTIONS AND GUIDELINES GOALS EXERCISES CRITERIA TO ADVANCE  EXAMINATION  2 (Post-operative week 3 to 6) May discontinue sling use at 3 weeks; after 2 weeks can remove the sling at home and just use the sling at night and in community for 3rd week  May use arm for basic activities of daily living (such as feeding, brushing teeth, dressing.)   May submerge in water after 4 weeks  Avoid combined IR/EXT/ADD (hand behind the  back) and IR/ADD (reaching across chest) for dislocation precautions  Avoid acromial or scapular spine pain as increase deltoid loading - decrease load if this occurs  Elevation to 130 deg and ER to 30 deg - passive, active assisted or active  Low (less than 3/10) to no pain  Ability to fire all heads of the deltoid May discontinue grip, and active elbow and wrist exercises since using the arm in ADLs with sling removed around the home  Continue elevation to 130 and ER to 30, both in scapular plane   Submaximal isometrics (pain-free effort) for all functional heads of deltoid (anterior, posterior, middle).   Active exercise as able:  Supine forward punch  Place in balanced position with circumduction and progressive arcs in sagittal plane  Side-lying abduction to 90   Lateral raise with bent elbow   Prone extension to hip  Elevation in scapular plane to 130; ER in scapular plane to 30   Ability to fire isometrically all heads of the deltoid muscle without pain  Ability to place and hold the arm in balanced position (90 deg elevation in supine) Wound assessment  Neurovascular assessment  Swelling assessment  ROM shoulder elevation and ER(0)  Patient reported outcome measure  Pain level   PHASE PRECAUTIONS AND GUIDELINES GOALS EXERCISES CRITERIA TO ADVANCE  EXAMINATION  3 (Post-operative week 6 to 12) Avoid forceful end-range motion in any direction   Progress active use of the arm in ADLs without being restricted to arm by the side of the body;   No heavy lifting or carrying  Initiate functional IR behind the back gently without forceful overpressure  Avoid acromial or scapular spine pain as increase deltoid loading - decrease load if this occurs  NO UPPER BODY ERGOMETER  Optimize ROM for elevation and ER in scapular plane   Expected PROM: Elevation - 145-160; ER - 40-50 ; functional IR to L1  Recover AROM to approach as close to PROM available as  possible  Establish dynamic stability of the shoulder  Forward elevation in scapular plane active progression: supine to incline to vertical; short to long lever arm  Lateral raise with bent elbow; side-lying abduction  Active ER/IR with arm at side  Scapular retraction with light band resistance  Serratus anterior punches in supine; avoid wall, incline or prone press-ups for serratus anterior  Functional IR with hand slide up back - very gentle and gradual   AROM equals/approaches PROM with good mechanics for elevation  No pain  Higher level demand on shoulder than ADL functions PROM for elevation, ER(0)  AROM for elevation, ER(0) and functional IR  Patient reported outcome measure  Pain             PHASE PRECAUTIONS AND GUIDELINES GOALS EXERCISES CRITERIA TO ADVANCE  EXAMINATION  4 (Post-operative week 12+) No heavy lifting and no overhead sports  Weight lifting limit 25.lb  No heavy pushing activity  Gradually increase strength   NO UPPER BODY ERGOMETER  Optimize functional  use of operative UE to patient specific goals  Gradual increase in deltoid, scapular muscle and rotator cuff strength  Pain-free functional activities Light hand weights for deltoid up to and not to exceed 3 lbs for anterior and posterior with long arm lift against gravity; elbow bent to 90 deg for abduction in scapular plane  Band progression for extension to hip with scapular depression/retraction  Band progression for serratus anterior punches in supine; avoid wall, incline or prone press-ups for serratus anterior  End-range stretching gently without forceful overpressure in all planes (elevation in scapular plane, ER in scapular plane, functional IR) with stretching done for life as part of daily routine  NO UPPER BODY ERGOMETER  Pain-free AROM for shoulder elevation (expect around 135-150 deg)  Functional strength for all ADLs, work tasks, and hobbies approved by  [employer] with home maintenance program PROM for elevation, ER(0); ER(90)  AROM for elevation, ER(0) and functional IR  Scapulohumeral rhythm/biomechanics of active movement strategies  Strength testing for deltoid, RTC, scapular muscles  Patient reported outcome measure  Pain

## 2024-01-02 NOTE — Progress Notes (Deleted)
 Patient Care Team: Lockie Mola, MD as PCP - General (Family Medicine) Aura Camps, MD (Ophthalmology) Barron Alvine, MD (Inactive) (Urology) Dr. Cleon Dew (Dental General Practice) Claud Kelp, MD as Consulting Physician (General Surgery) Hubbard Hartshorn, NP (Inactive) as Nurse Practitioner (Nurse Practitioner) Malachy Mood, MD as Consulting Physician (Hematology) Helane Gunther, DPM (Podiatry) Deirdre Priest Estill Batten, MD (Family Medicine) Juanell Fairly, RN as [facility] Care Management   CHIEF COMPLAINT: Follow-up right breast cancer  Oncology History Overview Note    Breast cancer of lower-outer quadrant of right female breast   Staging form: Breast, AJCC 7th Edition     Clinical stage from 02/19/2015: Stage IA (T1b, N0, M0) - Unsigned       Staging comments: Staged at breast conference on 3.23.16      Pathologic stage from 03/24/2015: Stage IA (T1c, N0, cM0) - Signed by Pecola Leisure, MD on 03/31/2015       Staging comments: Staged on final lumpectomy specimen by Dr. Frederica Kuster.    Breast cancer of lower-outer quadrant of right female breast (HCC)  01/23/2015 Mammogram   Possible mass in right breast warrants further evaluation.  No findings in the left breast suspicious for malignancy   02/03/2015 Breast US   Right breast: 5 x 5 x 5 mm irregular hypoechoic mass with internal color vascularity at 6 o'clock position, 4 cm from the nipple   02/10/2015 Initial Biopsy   Right needle core bx LOQ: Invasive ductal carcinoma, grade 1, ER+ (100%), PR+ (60%), HER2/neu negative (ratio 1.13), Ki67 3%; DCIS.     02/12/2015 Breast MRI   Biopsy-proven malignancy in the central right breast at posterior depth. No MR findings to suggest multicentric or contralateral malignancy.   02/12/2015 Clinical Stage   Stage IA: T1b N0   03/21/2015 Definitive Surgery   Right breast lumpectomy / SLNB Derrell Lolling): Invasive ductal carcinoma, grade 1, 1.1 cm, ER+ (100%), PR+ (73%), HER-2 negative  (ratio 1.3), Ki67 6%, negative margins / no lymphovascular invasion; DCIS.  One lymph node negative for tumor (0/1).     03/21/2015 Oncotype testing   RS 7, low risk. (ROR 5% for 10-year recurrence with Tamoxifen alone).    03/21/2015 Pathologic Stage   Stage IA: pT1c pN0   05/07/2015 - 06/23/2015 Radiation Therapy   Adjuvant RT completed Michell Heinrich): Right breast 45 Gy over 25 fractions.   Right breast boost 16 Gy over 8 fractions. Total dose: 60 Gy.   06/02/2015 - 05/2020 Anti-estrogen oral therapy   Aromasin 25 mg once daily Mosetta Putt).  Planned duration of therapy: 5 years, completed in 05/2020.    08/15/2015 Survivorship   Survivorship visit completed and copy of survivorship care plan provided to patient.   01/31/2018 Mammogram   IMPRESSION: No mammographic evidence for malignancy.      CURRENT THERAPY: Surveillance  INTERVAL HISTORY Ms. Pulver returns for annual follow-up, last seen by Dr. Mosetta Putt 12/30/2022.  Mammogram 03/11/2023 was benign  ROS   Past Medical History:  Diagnosis Date   Anemia    Arthritis    Asthma    Blood transfusion without reported diagnosis    Patients believes 2015 when she overdosed on "medications"    Breast cancer of lower-outer quadrant of right female breast (HCC) 02/13/2015   Cataract    CHF, acute (HCC) 05/03/2012   Depression    Diabetes mellitus    type 2   Eczema    Fatty liver    Fatty infiltration of liver noted on 03/2012  CT scan   Fibromyalgia    Glaucoma    bilateral   Heart disease    History of kidney stones    w/ hx of hydronephrosis - followed by [facility]   HIV nonspecific serology    2006: indeterminate HIV blood test, seen by ID, felt secondary to cross reacting antibodies with no further workup felt necessary at that time    Hyperlipidemia    Hypertension    Obesity    Personal history of radiation therapy 2016   right   Radiation 05/07/15-06/23/15   Right Breast     Past Surgical History:  Procedure Laterality Date    BREAST BIOPSY Left 02/10/2015   malignant    BREAST LUMPECTOMY Right 03/21/2015   CHOLECYSTECTOMY  2003   CYSTOSCOPY W/ LITHOLAPAXY / EHL     LEFT AND RIGHT HEART CATHETERIZATION WITH CORONARY ANGIOGRAM N/A 05/02/2012   Procedure: LEFT AND RIGHT HEART CATHETERIZATION WITH CORONARY ANGIOGRAM;  Surgeon: Kathleene Hazel, MD;  Location: [facility] CATH LAB;  Service: Cardiovascular;  Laterality: N/A;   RADIOACTIVE SEED GUIDED PARTIAL MASTECTOMY WITH AXILLARY SENTINEL LYMPH NODE BIOPSY Right 03/21/2015   Procedure: RIGHT  PARTIAL MASTECTOMY WITH RADIOACTIVE SEED LOCALIZATION RIGHT  AXILLARY SENTINEL LYMPH NODE BIOPSY;  Surgeon: Claud Kelp, MD;  Location:  SURGERY CENTER;  Service: General;  Laterality: Right;   REPLACEMENT TOTAL KNEE BILATERAL  2005 &2006   REVERSE SHOULDER ARTHROPLASTY Left 09/27/2023   Procedure: LEFT REVERSE SHOULDER ARTHROPLASTY;  Surgeon: Huel Cote, MD;  Location: [facility] OR;  Service: Orthopedics;  Laterality: Left;   VASCULAR SURGERY Right 03/15/2013   Ultrasound guided sclerotherapy     Outpatient Encounter Medications as of 01/02/2024  Medication Sig Note   acetaminophen (TYLENOL) 650 MG CR tablet Take 650-1,300 mg by mouth See admin instructions. Take 1300 mg by mouth in the morning, 650 mg in the afternoon and 1300 mg at night    allopurinol (ZYLOPRIM) 100 MG tablet TAKE 1 TABLET BY MOUTH DAILY    aspirin EC 325 MG tablet Take 1 tablet (325 mg total) by mouth daily. 09/21/2023: For use post procedure   atorvastatin (LIPITOR) 40 MG tablet TAKE 1 TABLET BY MOUTH DAILY    blood glucose meter kit and supplies Dispense based on patient and insurance preference.Use to check blood sugar in the morning. (FOR ICD-10 E10.9, E11.9).  Lum Babe is what she has been using.    Blood Glucose Monitoring Suppl (ONETOUCH VERIO) w/Device KIT Use to check blood glucose every morning. E11.9    cholecalciferol (VITAMIN D3) 25 MCG (1000 UNIT) tablet Take 2,000 Units by  mouth daily.    colchicine 0.6 MG tablet TAKE 1 TABLET BY MOUTH DAILY (Patient taking differently: Take 0.6 mg by mouth at bedtime.)    dorzolamide (TRUSOPT) 2 % ophthalmic solution Place 1 drop into both eyes 2 (two) times daily.    fluticasone furoate-vilanterol (BREO ELLIPTA) 200-25 MCG/ACT AEPB Inhale 1 puff into the lungs daily. (Patient taking differently: Inhale 1 puff into the lungs at bedtime.)    gabapentin (NEURONTIN) 100 MG capsule TAKE 1 CAPSULE BY MOUTH AT  BEDTIME    glucose blood (ONETOUCH ULTRA) test strip USE TO CHECK BLOOD SUGAR EVERY MORNING    glucose blood (ONETOUCH VERIO) test strip Use as instructed    glucose blood (ONETOUCH VERIO) test strip CHECK BLOOD SUGAR IN THE  MORNING AS DIRECTED    Insulin Pen Needle (EASY COMFORT PEN NEEDLES) 31G X 8 MM MISC USE TO INJECT  INSULIN EVERY DAY AS DIRECTED    JARDIANCE 25 MG TABS tablet TAKE 1 TABLET BY MOUTH DAILY    Lancet Devices (ONETOUCH DELICA PLUS LANCING) MISC USE ONCE DAILY    Lancets (ONETOUCH DELICA PLUS LANCET33G) MISC USE DAILY AS NEEDED.    LANTUS SOLOSTAR 100 UNIT/ML Solostar Pen INJECT 30 UNITS EACH MORNING    latanoprost (XALATAN) 0.005 % ophthalmic solution Place 1 drop into both eyes at bedtime.     losartan (COZAAR) 100 MG tablet TAKE 1 TABLET BY MOUTH AT  BEDTIME    metoprolol succinate (TOPROL-XL) 25 MG 24 hr tablet Take 12.5 mg by mouth daily.    ONETOUCH VERIO test strip CHECK BLOOD SUGAR IN THE MORNING AS DIRECTED    Semaglutide, 2 MG/DOSE, (OZEMPIC, 2 MG/DOSE,) 8 MG/3ML SOPN Inject 2 mg into the skin once a week.    spironolactone (ALDACTONE) 25 MG tablet TAKE 1 TABLET BY MOUTH DAILY    trolamine salicylate (ASPERCREME) 10 % cream Apply 1 Application topically as needed for muscle pain.    No facility-administered encounter medications on file as of 01/02/2024.     There were no vitals filed for this visit. There is no height or weight on file to calculate BMI.   PHYSICAL EXAM GENERAL:alert, no  distress and comfortable SKIN: no rash  EYES: sclera clear NECK: without mass LYMPH:  no palpable cervical or supraclavicular lymphadenopathy  LUNGS: clear with normal breathing effort HEART: regular rate & rhythm, no lower extremity edema ABDOMEN: abdomen soft, non-tender and normal bowel sounds NEURO: alert & oriented x 3 with fluent speech, no focal motor/sensory deficits Breast exam:  PAC without erythema    CBC    Component Value Date/Time   WBC 7.6 12/30/2022 0913   WBC 10.4 12/30/2020 1314   RBC 4.18 12/30/2022 0913   HGB 13.9 09/27/2023 1245   HGB 13.1 12/30/2022 0913   HGB 11.5 (L) 08/03/2017 0936   HCT 41.0 09/27/2023 1245   HCT 37.4 08/03/2017 0936   PLT 160 12/30/2022 0913   PLT 185 08/03/2017 0936   MCV 97.8 12/30/2022 0913   MCV 97.1 08/03/2017 0936   MCH 31.3 12/30/2022 0913   MCHC 32.0 12/30/2022 0913   RDW 13.2 12/30/2022 0913   RDW 13.8 08/03/2017 0936   LYMPHSABS 2.8 12/30/2022 0913   LYMPHSABS 2.8 08/03/2017 0936   MONOABS 0.7 12/30/2022 0913   MONOABS 0.7 08/03/2017 0936   EOSABS 0.2 12/30/2022 0913   EOSABS 0.2 08/03/2017 0936   BASOSABS 0.1 12/30/2022 0913   BASOSABS 0.0 08/03/2017 0936     CMP     Component Value Date/Time   NA 139 09/27/2023 1245   NA 139 12/07/2022 0911   NA 141 08/03/2017 0936   K 4.2 09/27/2023 1245   K 4.9 08/03/2017 0936   CL 104 09/27/2023 1245   CO2 24 09/15/2023 1330   CO2 23 08/03/2017 0936   GLUCOSE 115 (H) 09/27/2023 1245   GLUCOSE 219 (H) 08/03/2017 0936   BUN 27 (H) 09/27/2023 1245   BUN 26 12/07/2022 0911   BUN 40.1 (H) 08/03/2017 0936   CREATININE 1.40 (H) 09/27/2023 1245   CREATININE 1.20 (H) 12/30/2022 0913   CREATININE 1.5 (H) 08/03/2017 0936   CALCIUM 9.6 09/15/2023 1330   CALCIUM 9.6 08/03/2017 0936   PROT 7.6 12/30/2022 0913   PROT 6.8 12/07/2022 0911   PROT 7.5 08/03/2017 0936   ALBUMIN 4.0 12/30/2022 0913   ALBUMIN 4.3 12/07/2022 0911   ALBUMIN 3.8  08/03/2017 0936   AST 22 12/30/2022  0913   AST 16 08/03/2017 0936   ALT 18 12/30/2022 0913   ALT 12 08/03/2017 0936   ALKPHOS 104 12/30/2022 0913   ALKPHOS 141 08/03/2017 0936   BILITOT 0.6 12/30/2022 0913   BILITOT 0.39 08/03/2017 0936   GFRNONAA 47 (L) 09/15/2023 1330   GFRNONAA 48 (L) 12/30/2022 0913   GFRAA 55 (L) 08/05/2020 1008   GFRAA 42 (L) 12/31/2019 9147     ASSESSMENT & PLAN:73 year old female   Breast cancer of lower-outer quadrant of right breast, invasive ductal carcinoma, pT1c N0 M0, stage IA, ER positive PR positive HER-2 negative, Ki 67 6%, and DCIS -Diagnosed 01/2015, s/p right lumpectomy and SLNB and adjuvant radiation. oncotype showed low risk, adjuvant chemo was not indicated.  -she completed antiestrogen therapy with exemestane from 05/2015 - 05/2020, tolerated well.  Currently on surveillance  -Mammogram 02/2023 was benign   Bone health -Her bone density scan in 08/2015 was normal. DEXA from 01/31/18 normal with T Score of -0.9, and decreaesd to -1.7 at forearm radius in 02/2020 -continue calcium and vitamin D. She is off AI.   PLAN:  No orders of the defined types were placed in this encounter.     All questions were answered. The patient knows to call the clinic with any problems, questions or concerns. No barriers to learning were detected. I spent *** counseling the patient face to face. The total time spent in the appointment was *** and more than 50% was on counseling, review of test results, and coordination of care.   Santiago Glad, NP-C @DATE @

## 2024-01-02 NOTE — Telephone Encounter (Signed)
Called patient due to not showing up for her 9am lab appointment. Was unable to leave a message due to the vm not being set up. Will no show patient for appointments.

## 2024-01-04 ENCOUNTER — Ambulatory Visit: Payer: 59 | Attending: Family Medicine

## 2024-01-04 DIAGNOSIS — M6281 Muscle weakness (generalized): Secondary | ICD-10-CM

## 2024-01-04 DIAGNOSIS — Z96612 Presence of left artificial shoulder joint: Secondary | ICD-10-CM | POA: Diagnosis not present

## 2024-01-04 DIAGNOSIS — G8929 Other chronic pain: Secondary | ICD-10-CM | POA: Diagnosis not present

## 2024-01-04 DIAGNOSIS — M25512 Pain in left shoulder: Secondary | ICD-10-CM | POA: Diagnosis not present

## 2024-01-06 ENCOUNTER — Other Ambulatory Visit: Payer: Self-pay | Admitting: Family Medicine

## 2024-01-09 ENCOUNTER — Other Ambulatory Visit: Payer: Self-pay | Admitting: Family Medicine

## 2024-01-09 ENCOUNTER — Other Ambulatory Visit (HOSPITAL_COMMUNITY): Payer: Self-pay

## 2024-01-29 NOTE — Progress Notes (Unsigned)
 Patient Care Team: Lockie Mola, MD as PCP - General (Family Medicine) Aura Camps, MD (Ophthalmology) Barron Alvine, MD (Inactive) (Urology) Dr. Cleon Dew (Dental General Practice) Claud Kelp, MD as Consulting Physician (General Surgery) Hubbard Hartshorn, NP (Inactive) as Nurse Practitioner (Nurse Practitioner) Malachy Mood, MD as Consulting Physician (Hematology) Helane Gunther, DPM (Podiatry) Deirdre Priest Estill Batten, MD (Family Medicine) Juanell Fairly, RN as [facility] Care Management   CHIEF COMPLAINT: Follow-up right breast cancer  Oncology History Overview Note    Breast cancer of lower-outer quadrant of right female breast   Staging form: Breast, AJCC 7th Edition     Clinical stage from 02/19/2015: Stage IA (T1b, N0, M0) - Unsigned       Staging comments: Staged at breast conference on 3.23.16      Pathologic stage from 03/24/2015: Stage IA (T1c, N0, cM0) - Signed by Pecola Leisure, MD on 03/31/2015       Staging comments: Staged on final lumpectomy specimen by Dr. Frederica Kuster.    Breast cancer of lower-outer quadrant of right female breast (HCC)  01/23/2015 Mammogram   Possible mass in right breast warrants further evaluation.  No findings in the left breast suspicious for malignancy   02/03/2015 Breast US   Right breast: 5 x 5 x 5 mm irregular hypoechoic mass with internal color vascularity at 6 o'clock position, 4 cm from the nipple   02/10/2015 Initial Biopsy   Right needle core bx LOQ: Invasive ductal carcinoma, grade 1, ER+ (100%), PR+ (60%), HER2/neu negative (ratio 1.13), Ki67 3%; DCIS.     02/12/2015 Breast MRI   Biopsy-proven malignancy in the central right breast at posterior depth. No MR findings to suggest multicentric or contralateral malignancy.   02/12/2015 Clinical Stage   Stage IA: T1b N0   03/21/2015 Definitive Surgery   Right breast lumpectomy / SLNB Derrell Lolling): Invasive ductal carcinoma, grade 1, 1.1 cm, ER+ (100%), PR+ (73%), HER-2 negative  (ratio 1.3), Ki67 6%, negative margins / no lymphovascular invasion; DCIS.  One lymph node negative for tumor (0/1).     03/21/2015 Oncotype testing   RS 7, low risk. (ROR 5% for 10-year recurrence with Tamoxifen alone).    03/21/2015 Pathologic Stage   Stage IA: pT1c pN0   05/07/2015 - 06/23/2015 Radiation Therapy   Adjuvant RT completed Michell Heinrich): Right breast 45 Gy over 25 fractions.   Right breast boost 16 Gy over 8 fractions. Total dose: 60 Gy.   06/02/2015 - 05/2020 Anti-estrogen oral therapy   Aromasin 25 mg once daily Mosetta Putt).  Planned duration of therapy: 5 years, completed in 05/2020.    08/15/2015 Survivorship   Survivorship visit completed and copy of survivorship care plan provided to patient.   01/31/2018 Mammogram   IMPRESSION: No mammographic evidence for malignancy.      CURRENT THERAPY: Surveillance  INTERVAL HISTORY Ms. Hunt returns for annual follow-up, last seen by Dr. Mosetta Putt 12/30/2022.  Mammogram 03/11/2023 was benign.  She had left shoulder surgery in the past year, but otherwise no significant changes in her health.  She continues to have arthritic pain mainly in her knees, ambulates with a walker.  Denies other new or worsening bone pain.  She had a "boil" in her breast that resolved.  The small nodule in the left axilla has been there for "2-3 years).  Denies nipple discharge or other breast change.  Exertional dyspnea is stable since cardiac procedure.  ROS  All other systems reviewed and negative  Past Medical History:  Diagnosis Date   Anemia    Arthritis    Asthma    Blood transfusion without reported diagnosis    Patients believes 2015 when she overdosed on "medications"    Breast cancer of lower-outer quadrant of right female breast (HCC) 02/13/2015   Cataract    CHF, acute (HCC) 05/03/2012   Depression    Diabetes mellitus    type 2   Eczema    Fatty liver    Fatty infiltration of liver noted on 03/2012 CT scan   Fibromyalgia    Glaucoma    bilateral    Heart disease    History of kidney stones    w/ hx of hydronephrosis - followed by [facility]   HIV nonspecific serology    2006: indeterminate HIV blood test, seen by ID, felt secondary to cross reacting antibodies with no further workup felt necessary at that time    Hyperlipidemia    Hypertension    Obesity    Personal history of radiation therapy 2016   right   Radiation 05/07/15-06/23/15   Right Breast     Past Surgical History:  Procedure Laterality Date   BREAST BIOPSY Left 02/10/2015   malignant    BREAST LUMPECTOMY Right 03/21/2015   CHOLECYSTECTOMY  2003   CYSTOSCOPY W/ LITHOLAPAXY / EHL     LEFT AND RIGHT HEART CATHETERIZATION WITH CORONARY ANGIOGRAM N/A 05/02/2012   Procedure: LEFT AND RIGHT HEART CATHETERIZATION WITH CORONARY ANGIOGRAM;  Surgeon: Kathleene Hazel, MD;  Location: [facility] CATH LAB;  Service: Cardiovascular;  Laterality: N/A;   RADIOACTIVE SEED GUIDED PARTIAL MASTECTOMY WITH AXILLARY SENTINEL LYMPH NODE BIOPSY Right 03/21/2015   Procedure: RIGHT  PARTIAL MASTECTOMY WITH RADIOACTIVE SEED LOCALIZATION RIGHT  AXILLARY SENTINEL LYMPH NODE BIOPSY;  Surgeon: Claud Kelp, MD;  Location:  SURGERY CENTER;  Service: General;  Laterality: Right;   REPLACEMENT TOTAL KNEE BILATERAL  2005 &2006   REVERSE SHOULDER ARTHROPLASTY Left 09/27/2023   Procedure: LEFT REVERSE SHOULDER ARTHROPLASTY;  Surgeon: Huel Cote, MD;  Location: [facility] OR;  Service: Orthopedics;  Laterality: Left;   VASCULAR SURGERY Right 03/15/2013   Ultrasound guided sclerotherapy     Outpatient Encounter Medications as of 01/30/2024  Medication Sig Note   acetaminophen (TYLENOL) 650 MG CR tablet Take 650-1,300 mg by mouth See admin instructions. Take 1300 mg by mouth in the morning, 650 mg in the afternoon and 1300 mg at night    allopurinol (ZYLOPRIM) 100 MG tablet TAKE 1 TABLET BY MOUTH DAILY    aspirin EC 325 MG tablet Take 1 tablet (325 mg total) by mouth daily. 09/21/2023: For  use post procedure   atorvastatin (LIPITOR) 40 MG tablet TAKE 1 TABLET BY MOUTH DAILY    blood glucose meter kit and supplies Dispense based on patient and insurance preference.Use to check blood sugar in the morning. (FOR ICD-10 E10.9, E11.9).  Lum Babe is what she has been using.    Blood Glucose Monitoring Suppl (ONETOUCH VERIO) w/Device KIT Use to check blood glucose every morning. E11.9    cholecalciferol (VITAMIN D3) 25 MCG (1000 UNIT) tablet Take 2,000 Units by mouth daily.    colchicine 0.6 MG tablet TAKE 1 TABLET BY MOUTH DAILY (Patient taking differently: Take 0.6 mg by mouth at bedtime.)    dorzolamide (TRUSOPT) 2 % ophthalmic solution Place 1 drop into both eyes 2 (two) times daily.    fluticasone furoate-vilanterol (BREO ELLIPTA) 200-25 MCG/ACT AEPB Inhale 1 puff into the lungs at bedtime.  gabapentin (NEURONTIN) 100 MG capsule TAKE 1 CAPSULE BY MOUTH AT  BEDTIME    Insulin Pen Needle (EASY COMFORT PEN NEEDLES) 31G X 8 MM MISC USE TO INJECT INSULIN EVERY DAY AS DIRECTED    JARDIANCE 25 MG TABS tablet TAKE 1 TABLET BY MOUTH DAILY    Lancet Devices (ONETOUCH DELICA PLUS LANCING) MISC USE ONCE DAILY    Lancets (ONETOUCH DELICA PLUS LANCET33G) MISC USE DAILY AS NEEDED.    LANTUS SOLOSTAR 100 UNIT/ML Solostar Pen INJECT 30 UNITS EACH MORNING    latanoprost (XALATAN) 0.005 % ophthalmic solution Place 1 drop into both eyes at bedtime.     losartan (COZAAR) 100 MG tablet TAKE 1 TABLET BY MOUTH AT  BEDTIME    metoprolol succinate (TOPROL-XL) 25 MG 24 hr tablet Take 12.5 mg by mouth daily.    ONETOUCH VERIO test strip CHECK BLOOD SUGAR IN THE MORNING AS DIRECTED    Semaglutide, 2 MG/DOSE, (OZEMPIC, 2 MG/DOSE,) 8 MG/3ML SOPN Inject 2 mg into the skin once a week.    spironolactone (ALDACTONE) 25 MG tablet TAKE 1 TABLET BY MOUTH DAILY    trolamine salicylate (ASPERCREME) 10 % cream Apply 1 Application topically as needed for muscle pain.    No facility-administered encounter  medications on file as of 01/30/2024.     Today's Vitals   01/30/24 1056 01/30/24 1100  BP: (!) 164/85   Pulse: 76   Resp: 16   Temp: 97.6 F (36.4 C)   TempSrc: Temporal   SpO2: 98%   Weight: 210 lb 11.2 oz (95.6 kg)   PainSc:  7    Body mass index is 38.54 kg/m.   PHYSICAL EXAM GENERAL:alert, no distress and comfortable SKIN: no rash.  Keloids on the chest and anterior left breast EYES: sclera clear NECK: without mass LYMPH:  no palpable cervical or supraclavicular lymphadenopathy  LUNGS:  normal breathing effort HEART: no lower extremity edema ABDOMEN: abdomen soft, non-tender and normal bowel sounds NEURO: alert & oriented x 3 with fluent speech, no focal motor/sensory deficits Breast exam: No nipple discharge or inversion.  S/p right lumpectomy and radiation, incisions completely healed.  No palpable mass or nodularity in either breast or right axilla that I could appreciate.  Small 1 cm palpable superficial nodule in the left axilla has been present 2-3 years per patient   CBC    Component Value Date/Time   WBC 6.6 01/30/2024 1022   WBC 10.4 12/30/2020 1314   RBC 4.21 01/30/2024 1022   HGB 13.1 01/30/2024 1022   HGB 11.5 (L) 08/03/2017 0936   HCT 41.7 01/30/2024 1022   HCT 37.4 08/03/2017 0936   PLT 153 01/30/2024 1022   PLT 185 08/03/2017 0936   MCV 99.0 01/30/2024 1022   MCV 97.1 08/03/2017 0936   MCH 31.1 01/30/2024 1022   MCHC 31.4 01/30/2024 1022   RDW 13.8 01/30/2024 1022   RDW 13.8 08/03/2017 0936   LYMPHSABS 2.4 01/30/2024 1022   LYMPHSABS 2.8 08/03/2017 0936   MONOABS 0.8 01/30/2024 1022   MONOABS 0.7 08/03/2017 0936   EOSABS 0.2 01/30/2024 1022   EOSABS 0.2 08/03/2017 0936   BASOSABS 0.1 01/30/2024 1022   BASOSABS 0.0 08/03/2017 0936     CMP     Component Value Date/Time   NA 139 01/30/2024 1022   NA 139 12/07/2022 0911   NA 141 08/03/2017 0936   K 4.4 01/30/2024 1022   K 4.9 08/03/2017 0936   CL 103 01/30/2024 1022   CO2  30 01/30/2024  1022   CO2 23 08/03/2017 0936   GLUCOSE 150 (H) 01/30/2024 1022   GLUCOSE 219 (H) 08/03/2017 0936   BUN 26 (H) 01/30/2024 1022   BUN 26 12/07/2022 0911   BUN 40.1 (H) 08/03/2017 0936   CREATININE 1.28 (H) 01/30/2024 1022   CREATININE 1.5 (H) 08/03/2017 0936   CALCIUM 9.5 01/30/2024 1022   CALCIUM 9.6 08/03/2017 0936   PROT 7.5 01/30/2024 1022   PROT 6.8 12/07/2022 0911   PROT 7.5 08/03/2017 0936   ALBUMIN 4.1 01/30/2024 1022   ALBUMIN 4.3 12/07/2022 0911   ALBUMIN 3.8 08/03/2017 0936   AST 16 01/30/2024 1022   AST 16 08/03/2017 0936   ALT 13 01/30/2024 1022   ALT 12 08/03/2017 0936   ALKPHOS 94 01/30/2024 1022   ALKPHOS 141 08/03/2017 0936   BILITOT 0.6 01/30/2024 1022   BILITOT 0.39 08/03/2017 0936   GFRNONAA 44 (L) 01/30/2024 1022   GFRAA 55 (L) 08/05/2020 1008   GFRAA 42 (L) 12/31/2019 0922     ASSESSMENT & PLAN:73 year old female   Breast cancer of lower-outer quadrant of right breast, invasive ductal carcinoma, pT1c N0 M0, stage IA, ER positive PR positive HER-2 negative, Ki 67 6%, and DCIS -Diagnosed 01/2015, s/p right lumpectomy and SLNB and adjuvant radiation. oncotype showed low risk, adjuvant chemo was not indicated.  -she completed antiestrogen therapy with exemestane from 05/2015 - 05/2020, tolerated well.  Currently on surveillance  -Ms Harris is clinically doing well.  Exam is benign except for what is likely a small cyst in the left axilla, she has a history of inflamed sebaceous cysts in the past.  Otherwise no clinical concern for recurrent or new malignancy -She is due annual mammogram in April, will add left axillary ultrasound if needed -She prefers to continue annual follow-up, knows to call sooner if needed   Bone health -Her bone density scan in 08/2015 was normal. DEXA from 01/31/18 normal with T Score of -0.9, and decreaesd to -1.7 at forearm radius in 02/2020 -continue calcium and vitamin D. She is off AI.  -Per PCP   PLAN: -Labs reviewed -Continue  breast cancer surveillance -Annual bilateral mammo due in April, will add left ax [place] if needed -F/up in 1 year, or sooner if needed   Orders Placed This Encounter  Procedures   MM 3D SCREENING MAMMOGRAM BILATERAL BREAST    Standing Status:   Future    Expected Date:   03/11/2024    Expiration Date:   01/29/2025    Reason for Exam (SYMPTOM  OR DIAGNOSIS REQUIRED):   h/o R breast cancer 2016    [facility]erred [facility]   MS US BREAST LTD UNI LEFT INC AXILLA    Standing Status:   Future    Expected Date:   03/11/2024    Expiration Date:   01/29/2025    Reason for Exam (SYMPTOM  OR DIAGNOSIS REQUIRED):   small superficial left axillary nodule, has been there 2-3 years per pt, h/o inflamed sebaceous cysts    Preferred imaging location?:   [facility]      All questions were answered. The patient knows to call the clinic with any problems, questions or concerns. No barriers to learning were detected.   Santiago Glad, NP-C 01/30/2024

## 2024-01-30 ENCOUNTER — Inpatient Hospital Stay: Payer: 59 | Attending: Hematology

## 2024-01-30 ENCOUNTER — Inpatient Hospital Stay (HOSPITAL_BASED_OUTPATIENT_CLINIC_OR_DEPARTMENT_OTHER): Payer: 59 | Admitting: Nurse Practitioner

## 2024-01-30 ENCOUNTER — Encounter: Payer: Self-pay | Admitting: Nurse Practitioner

## 2024-01-30 VITALS — BP 164/85 | HR 76 | Temp 97.6°F | Resp 16 | Wt 210.7 lb

## 2024-01-30 DIAGNOSIS — Z853 Personal history of malignant neoplasm of breast: Secondary | ICD-10-CM | POA: Diagnosis not present

## 2024-01-30 DIAGNOSIS — C50511 Malignant neoplasm of lower-outer quadrant of right female breast: Secondary | ICD-10-CM

## 2024-01-30 DIAGNOSIS — Z170 Estrogen receptor positive status [ER+]: Secondary | ICD-10-CM | POA: Diagnosis not present

## 2024-01-30 LAB — CMP (CANCER CENTER ONLY)
ALT: 13 U/L (ref 0–44)
AST: 16 U/L (ref 15–41)
Albumin: 4.1 g/dL (ref 3.5–5.0)
Alkaline Phosphatase: 94 U/L (ref 38–126)
Anion gap: 6 (ref 5–15)
BUN: 26 mg/dL — ABNORMAL HIGH (ref 8–23)
CO2: 30 mmol/L (ref 22–32)
Calcium: 9.5 mg/dL (ref 8.9–10.3)
Chloride: 103 mmol/L (ref 98–111)
Creatinine: 1.28 mg/dL — ABNORMAL HIGH (ref 0.44–1.00)
GFR, Estimated: 44 mL/min — ABNORMAL LOW (ref >60)
Glucose, Bld: 150 mg/dL — ABNORMAL HIGH (ref 70–99)
Potassium: 4.4 mmol/L (ref 3.5–5.1)
Sodium: 139 mmol/L (ref 135–145)
Total Bilirubin: 0.6 mg/dL (ref 0.0–1.2)
Total Protein: 7.5 g/dL (ref 6.5–8.1)

## 2024-01-30 LAB — CBC WITH DIFFERENTIAL (CANCER CENTER ONLY)
Abs Immature Granulocytes: 0.04 10*3/uL (ref 0.00–0.07)
Basophils Absolute: 0.1 10*3/uL (ref 0.0–0.1)
Basophils Relative: 1 %
Eosinophils Absolute: 0.2 10*3/uL (ref 0.0–0.5)
Eosinophils Relative: 3 %
HCT: 41.7 % (ref 36.0–46.0)
Hemoglobin: 13.1 g/dL (ref 12.0–15.0)
Immature Granulocytes: 1 %
Lymphocytes Relative: 36 %
Lymphs Abs: 2.4 10*3/uL (ref 0.7–4.0)
MCH: 31.1 pg (ref 26.0–34.0)
MCHC: 31.4 g/dL (ref 30.0–36.0)
MCV: 99 fL (ref 80.0–100.0)
Monocytes Absolute: 0.8 10*3/uL (ref 0.1–1.0)
Monocytes Relative: 11 %
Neutro Abs: 3.2 10*3/uL (ref 1.7–7.7)
Neutrophils Relative %: 48 %
Platelet Count: 153 10*3/uL (ref 150–400)
RBC: 4.21 MIL/uL (ref 3.87–5.11)
RDW: 13.8 % (ref 11.5–15.5)
WBC Count: 6.6 10*3/uL (ref 4.0–10.5)
nRBC: 0 % (ref 0.0–0.2)

## 2024-01-30 NOTE — Progress Notes (Unsigned)
    SUBJECTIVE:   CHIEF COMPLAINT: check up HPI:   [name] is a 73 y.o.  with history notable for type 2 DM presenting for follow up .  She reports she is doing well. Recovering from L shoulder surgery. Notes her BG have been a bit higher (160-200) due to eating later. Lives with family (daughter and granddaughter).   Diabetes Compliant with medications--insulin 30 units, Ozempic 2 mg, Jardiance. Denies low values. Has log with her today showing range as above. She is certain her slightly elevated numbers are due to eating dinner late--now eating dinner earlier (family eats heavier foods than she does)  Wants to get back to [facility] but family will not let her as they don't like her taking bus. She is at high risk of falls and uses rollator.  They do not feel walking in neighborhood safe.   HTN No CP, dyspnea, vision changes. Does have chronic dyspnea on exertion, unchanged for 3-5 years. Uses inhalers as prescribed  PERTINENT  PMH / PSH/Family/Social History : NICM, Asthma, diabetes, recent shoulder surgery  OBJECTIVE:   BP (!) 156/96   Pulse 78   Ht 5\' 2"  (1.575 m)   Wt 211 lb 9.6 oz (96 kg)   SpO2 100%   BMI 38.70 kg/m   Today's weight:  Last Weight  Most recent update: 01/31/2024  9:14 AM    Weight  96 kg (211 lb 9.6 oz)            Review of prior weights: [employer]   01/31/24 0914  Weight: 211 lb 9.6 oz (96 kg)    RRR Lungs clear Foot exam in quality metrics   ASSESSMENT/PLAN:   Assessment & Plan Type 2 diabetes mellitus with diabetic foot deformity (HCC) BG range 170-200 May need to change from Ozempic to Mounjaro A1C in 1 month Follow up with PCP Foot exam today   Discussed daily foot checks Referral for PREP Will complete [employer] [place] form--she will complete patient part at home  Essential hypertension At goal at home  but not in office--admits to anxiety  Will bring in home cuff to validate in 1 month with PCP     At next visit--needs an  A1C and check her home BP cuff against ours   Terisa Starr, MD  Family Medicine Teaching Service  [facility]

## 2024-01-31 ENCOUNTER — Telehealth: Payer: Self-pay | Admitting: Family Medicine

## 2024-01-31 ENCOUNTER — Encounter: Payer: Self-pay | Admitting: Family Medicine

## 2024-01-31 ENCOUNTER — Ambulatory Visit (INDEPENDENT_AMBULATORY_CARE_PROVIDER_SITE_OTHER): Payer: 59 | Admitting: Family Medicine

## 2024-01-31 VITALS — BP 156/96 | HR 78 | Ht 62.0 in | Wt 211.6 lb

## 2024-01-31 DIAGNOSIS — E1169 Type 2 diabetes mellitus with other specified complication: Secondary | ICD-10-CM

## 2024-01-31 DIAGNOSIS — I10 Essential (primary) hypertension: Secondary | ICD-10-CM

## 2024-01-31 DIAGNOSIS — M21969 Unspecified acquired deformity of unspecified lower leg: Secondary | ICD-10-CM

## 2024-01-31 NOTE — Assessment & Plan Note (Addendum)
 BG range 170-200 May need to change from Ozempic to [facility] A1C in 1 month Follow up with PCP Foot exam today   Discussed daily foot checks Referral for PREP Will complete [employer] [place] form--she will complete patient part at home

## 2024-01-31 NOTE — Patient Instructions (Signed)
 It was wonderful to see you today.  Please bring ALL of your medications with you to every visit.   Today we talked about:   - Eat dinner earlier  - bring your blood pressure cuff with you to your next visit  - Follow up in 1 month    You will be called about [facility] visit  I will complete the [employer] form   Please follow up in  months   Thank you for choosing [facility].   Please call [phone] with any questions about today's appointment.  Please be sure to schedule follow up at the front  desk before you leave today.   Terisa Starr, MD  Family Medicine

## 2024-01-31 NOTE — Assessment & Plan Note (Addendum)
 At goal at home  but not in office--admits to anxiety  Will bring in home cuff to validate in 1 month with PCP

## 2024-01-31 NOTE — Telephone Encounter (Signed)
Reviewed, completed, and signed form.  Note routed to RN team inbasket and placed completed form in RN Wall pocket in the front office.  Shadeed Colberg M Lorne Winkels, MD  

## 2024-02-01 ENCOUNTER — Other Ambulatory Visit: Payer: Self-pay | Admitting: Nurse Practitioner

## 2024-02-01 ENCOUNTER — Telehealth: Payer: Self-pay

## 2024-02-01 DIAGNOSIS — C50511 Malignant neoplasm of lower-outer quadrant of right female breast: Secondary | ICD-10-CM

## 2024-02-01 NOTE — Telephone Encounter (Signed)
 Form placed up front for pickup.   Copy made for batch scanning.   VM left asking her to return my call.   Please let her know [employer] [place] form is ready.

## 2024-02-01 NOTE — Telephone Encounter (Signed)
 Pt advised to call the breast center to schedule her mammogram and [place]. Phone number given to pt.

## 2024-02-02 ENCOUNTER — Telehealth: Payer: Self-pay | Admitting: *Deleted

## 2024-02-02 NOTE — Telephone Encounter (Signed)
 Contacted regarding PREP Class referral. She is interested in participating in a morning class. We will call her with future scheduled options.

## 2024-02-14 ENCOUNTER — Ambulatory Visit
Admission: RE | Admit: 2024-02-14 | Discharge: 2024-02-14 | Disposition: A | Discharge status: Home / Self Care | Source: Ambulatory Visit | Attending: Nurse Practitioner | Admitting: Nurse Practitioner

## 2024-02-14 ENCOUNTER — Other Ambulatory Visit: Payer: Self-pay | Admitting: Nurse Practitioner

## 2024-02-14 DIAGNOSIS — C50511 Malignant neoplasm of lower-outer quadrant of right female breast: Secondary | ICD-10-CM

## 2024-02-14 DIAGNOSIS — Z853 Personal history of malignant neoplasm of breast: Secondary | ICD-10-CM | POA: Diagnosis not present

## 2024-02-14 DIAGNOSIS — N6332 Unspecified lump in axillary tail of the left breast: Secondary | ICD-10-CM | POA: Diagnosis not present

## 2024-02-15 ENCOUNTER — Encounter: Payer: Self-pay | Admitting: Nurse Practitioner

## 2024-02-23 ENCOUNTER — Ambulatory Visit: Payer: 59 | Admitting: Podiatry

## 2024-03-01 ENCOUNTER — Encounter: Payer: Self-pay | Admitting: Family Medicine

## 2024-03-01 ENCOUNTER — Ambulatory Visit: Admitting: Family Medicine

## 2024-03-01 VITALS — BP 140/88 | HR 74 | Ht 62.0 in | Wt 210.8 lb

## 2024-03-01 DIAGNOSIS — E1169 Type 2 diabetes mellitus with other specified complication: Secondary | ICD-10-CM

## 2024-03-01 DIAGNOSIS — Z23 Encounter for immunization: Secondary | ICD-10-CM

## 2024-03-01 DIAGNOSIS — M21969 Unspecified acquired deformity of unspecified lower leg: Secondary | ICD-10-CM

## 2024-03-01 DIAGNOSIS — I10 Essential (primary) hypertension: Secondary | ICD-10-CM | POA: Diagnosis not present

## 2024-03-01 LAB — POCT GLYCOSYLATED HEMOGLOBIN (HGB A1C): HbA1c, POC (controlled diabetic range): 7.5 % — AB (ref 0.0–7.0)

## 2024-03-01 MED ORDER — METOPROLOL SUCCINATE ER 25 MG PO TB24: 12.5000 mg | ORAL_TABLET | Freq: Every day | ORAL | 3 refills | Status: AC

## 2024-03-01 NOTE — Telephone Encounter (Signed)
 Patient reports this form was not received--can we look into this and refax?

## 2024-03-01 NOTE — Telephone Encounter (Signed)
 Form was found in RN box without directional cover sheet. At the time we were unsure if this needed to be faxed or if patient planned to pick up.   Per note, patient was contacted in regards to form. She did not answer or call us back.  Per note, the completed form has been waiting up front since 3/5.  Patient will need to sign a ROI for [place] to fax to [employer] [place].   Attempted to call patient, however no answer. VM left to call me back.   Form has been pulled and at my desk in RN room.

## 2024-03-01 NOTE — Assessment & Plan Note (Signed)
 A1c slightly elevated at 7.5 today, prior 6.4   - Encouraged dietary changes and continue lifestyle modifications exercise as tolerable  - Continue meds as prescribed  -  [employer] [place] form completed for transportation to fitness facility - A1c in 3 month follow-up

## 2024-03-01 NOTE — Progress Notes (Addendum)
    SUBJECTIVE:   CHIEF COMPLAINT / HPI:   [name] is 72 y.o.  female with a past medical history of hypertension, chronic systolic diastolic heart failure, asthma, type 2 diabetes with neuropathy, hyperlipidemia and gout who presents for follow-up for blood pressure and diabetes management.  Overall she reports she is doing well.    Diabetes:  States that her fasting blood sugars have been 160s-190.  She reports some difficulty with candy intake in particular chocolate. She also reports some difficulties with maintaining diet due to celebrating her birthday and family members birthdays throughout the month.  Patient is exercising 2-3 times a week, mostly walking outside at least 30 minutes.  She is hopeful to go to the [facility] however she is unable to drive and will require some form of transportation. Requesting a form to assist with transportation.   She continues to be compliant with her medications Lantus 30 units daily, Ozempic 2 mg weekly and Jardiance 25 mg daily.  She denies any symptoms suggestive of hypoglycemia. Denies any intolerable side effects with current DM regimen.   Hypertension:  She checks her blood pressure every morning with readings of 120s-130s/70s 80s. Brought in home cuff for comparison. Patient reports compliance with medications including Cozaar 100 mg daily, Toprol-XL 12.5 milligrams daily and Aldactone 25 mg daily.  She denies chest pain, shortness of breath, dizziness or lightheadedness.  Arthritic pain: Patient reports arthritic pain is at baseline.  Has some limited range of motion in her left shoulder.  Continues to do exercise. Pain is controlled on Tylenol and Aspercreme.   PERTINENT  PMH / PSH: Asthma, diabetes, hypertension, recent shoulder surgery  OBJECTIVE:   BP (!) 160/82   Pulse 80   Ht 5\' 2"  (1.575 m)   Wt 210 lb 12.8 oz (95.6 kg)   SpO2 97%   BMI 38.56 kg/m    General: No acute distress, pleasant, cooperative  Pulm: Nonlabored  breathing, C clear to auscultation bilaterally and posterior lung fields CV: Regular rate and rhythm, no murmurs appreciated Abd: Normoactive bowel sounds, soft non-tender MSK: Limited range of motion in left shoulder  ASSESSMENT/PLAN:   Assessment & Plan Essential hypertension BP at goal at home, repeat BP 129/52 - Home BP cuff is ~ 10 higher in systolic compared to clinic cuff, repeat on home cuff in office was 140/88 - Refilled Metoprolol 12.5 mg daily for BP and HF management  - Continue meds as prescribed  Type 2 diabetes mellitus with diabetic foot deformity (HCC) A1c slightly elevated at 7.5 today, prior 6.4   - Encouraged dietary changes and continue lifestyle modifications exercise as tolerable  - Continue meds as prescribed  -  [employer] [place] form completed for transportation to fitness facility - A1c in 3 month follow-up    HCM COVID booster given today   She will follow-up in in 3 months for DM  and repeat A1c   Peterson Ao, MD [facility]

## 2024-03-01 NOTE — Patient Instructions (Addendum)
 It was wonderful to see you today.  Please bring ALL of your medications with you to every visit.   Today we talked about:  --Continue your current medications -you do need to restart your Metoprolol   -- Try sugar free candy   - Chocolate is okay---in moderation!!!   Keep up the great work walking   Please follow up in 3 months   Thank you for choosing [facility].   Please call [phone] with any questions about today's appointment.  Please be sure to schedule follow up at the front  desk before you leave today.   Terisa Starr, MD  Family Medicine

## 2024-03-01 NOTE — Assessment & Plan Note (Signed)
 BP at goal at home, repeat BP 129/52 - Home BP cuff is ~ 10 higher in systolic compared to clinic cuff, repeat on home cuff in office was 140/88 - Refilled Metoprolol 12.5 mg daily for BP and HF management  - Continue meds as prescribed

## 2024-03-01 NOTE — Progress Notes (Signed)
 Patient seen and examined with Dr. Birdena Crandall. I discussed the plan of care with the resident physician and agree with below documentation.   Terisa Starr, MD  l

## 2024-03-06 ENCOUNTER — Ambulatory Visit (INDEPENDENT_AMBULATORY_CARE_PROVIDER_SITE_OTHER): Admitting: Podiatry

## 2024-03-06 ENCOUNTER — Encounter: Payer: Self-pay | Admitting: Podiatry

## 2024-03-06 DIAGNOSIS — E1140 Type 2 diabetes mellitus with diabetic neuropathy, unspecified: Secondary | ICD-10-CM | POA: Diagnosis not present

## 2024-03-06 DIAGNOSIS — M2010 Hallux valgus (acquired), unspecified foot: Secondary | ICD-10-CM

## 2024-03-06 DIAGNOSIS — B351 Tinea unguium: Secondary | ICD-10-CM | POA: Diagnosis not present

## 2024-03-06 DIAGNOSIS — M2012 Hallux valgus (acquired), left foot: Secondary | ICD-10-CM

## 2024-03-06 NOTE — Progress Notes (Signed)
This patient returns to my office for at risk foot care.  This patient requires this care by a professional since this patient will be at risk due to having diabetes.   This patient is unable to cut nails herself since the patient cannot reach her nails.These nails are painful walking and wearing shoes.  This patient presents for at risk foot care today.  General Appearance  Alert, conversant and in no acute stress.  Vascular  Dorsalis pedis and posterior tibial  pulses are palpable  bilaterally.  Capillary return is within normal limits  bilaterally. Temperature is within normal limits  bilaterally.  Neurologic  Senn-Weinstein monofilament wire test absent   bilaterally. Muscle power within normal limits bilaterally.  Nails Thick disfigured discolored nails with subungual debris  from hallux to fifth toes bilaterally. No evidence of bacterial infection or drainage bilaterally.  Orthopedic  No limitations of motion  feet .  No crepitus or effusions noted.  No bony pathology or digital deformities noted. Asymptomatic  HAV  B/L.  Pes planus  B/L. Hammer toes second left foot. Contracted digits  B/L.  Skin  normotropic skin with no porokeratosis noted bilaterally.  No signs of infections or ulcers noted.     Onychomycosis  Pain in right toes  Pain in left toes    Consent was obtained for treatment procedures.   Mechanical debridement of nails 1-5  bilaterally performed with a nail nipper.  Filed with dremel without incident. Patient casted for diabetic shoes.   Return office visit  3 months                    Told patient to return for periodic foot care and evaluation due to potential at risk complications   Helane Gunther DPM

## 2024-03-26 ENCOUNTER — Ambulatory Visit (INDEPENDENT_AMBULATORY_CARE_PROVIDER_SITE_OTHER)

## 2024-03-26 ENCOUNTER — Ambulatory Visit (INDEPENDENT_AMBULATORY_CARE_PROVIDER_SITE_OTHER): Payer: 59 | Admitting: Orthopaedic Surgery

## 2024-03-26 DIAGNOSIS — Z96612 Presence of left artificial shoulder joint: Secondary | ICD-10-CM | POA: Diagnosis not present

## 2024-03-26 DIAGNOSIS — M19012 Primary osteoarthritis, left shoulder: Secondary | ICD-10-CM

## 2024-03-26 NOTE — Progress Notes (Signed)
 Post Operative Evaluation    Procedure/Date of Surgery: Left reverse shoulder arthroplasty 10/29  Interval History:   Presents 6 months status post left reverse shoulder arthroplasty.  At this time she does not have any pain.  She is working on moving the her overhead which is coming slowly. PMH/PSH/Family History/Social History/Meds/Allergies:    Past Medical History:  Diagnosis Date   Anemia    Arthritis    Asthma    Blood transfusion without reported diagnosis    Patients believes 2015 when she overdosed on "medications"    Breast cancer of lower-outer quadrant of right female breast (HCC) 02/13/2015   Cataract    CHF, acute (HCC) 05/03/2012   Depression    Diabetes mellitus    type 2   Eczema    Fatty liver    Fatty infiltration of liver noted on 03/2012 CT scan   Fibromyalgia    Glaucoma    bilateral   Heart disease    History of kidney stones    w/ hx of hydronephrosis - followed by [facility]   HIV nonspecific serology    2006: indeterminate HIV blood test, seen by ID, felt secondary to cross reacting antibodies with no further workup felt necessary at that time    Hyperlipidemia    Hypertension    Obesity    Personal history of radiation therapy 2016   right   Radiation 05/07/15-06/23/15   Right Breast   Past Surgical History:  Procedure Laterality Date   BREAST BIOPSY Left 02/10/2015   malignant    BREAST LUMPECTOMY Right 03/21/2015   CHOLECYSTECTOMY  2003   CYSTOSCOPY W/ LITHOLAPAXY / EHL     LEFT AND RIGHT HEART CATHETERIZATION WITH CORONARY ANGIOGRAM N/A 05/02/2012   Procedure: LEFT AND RIGHT HEART CATHETERIZATION WITH CORONARY ANGIOGRAM;  Surgeon: Odie Benne, MD;  Location[facility] CATH LAB;  Service: Cardiovascular;  Laterality: N/A;   RADIOACTIVE SEED GUIDED PARTIAL MASTECTOMY WITH AXILLARY SENTINEL LYMPH NODE BIOPSY Right 03/21/2015   Procedure: RIGHT  PARTIAL MASTECTOMY WITH RADIOACTIVE SEED  LOCALIZATION RIGHT  AXILLARY SENTINEL LYMPH NODE BIOPSY;  Surgeon: Boyce Byes, MD;  Location: [facility];  Service: General;  Laterality: Right;   REPLACEMENT TOTAL KNEE BILATERAL  2005 &2006   REVERSE SHOULDER ARTHROPLASTY Left 09/27/2023   Procedure: LEFT REVERSE SHOULDER ARTHROPLASTY;  Surgeon: Wilhelmenia Harada, MD;  Location: [facility] OR;  Service: Orthopedics;  Laterality: Left;   VASCULAR SURGERY Right 03/15/2013   Ultrasound guided sclerotherapy   Social History   Socioeconomic History   Marital status: Widowed    Spouse name: Bearl Limes   Number of children: 3   Years of education: 14   Highest education level: Associate degree: occupational, Scientist, product/process development, or vocational program  Occupational History   Occupation: retired-CNA, Equities trader: RETIRED   Occupation: Engineer, site  Tobacco Use   Smoking status: Never   Smokeless tobacco: Never  Vaping Use   Vaping status: Never Used  Substance and Sexual Activity   Alcohol use: No    Alcohol/week: 0.0 standard drinks of alcohol   Drug use: No   Sexual activity: Not Currently    Birth control/protection: Post-menopausal  Other Topics Concern   Not on file  Social History Narrative   Emergency Contact: son, Antiqua Afolabi [phone]  Patient lives is single story home with daughter, Assunta Lax and grandddaughter, Jonna Netter. Ramp to front door, 3 steps in back. Home has smoke detectors, no throw rugs. No grab bars in bathroom. No pets.   Diet: Pt has a varied diet.  Reports eating 2 meals a day.    Exercise: walks daily 15-20 mins; not as much due to possible irritable bowel; does exercise at church once weekly. Serves on [employer] at [employer] and as a Chief Strategy Officer at [employer].   Seatbelts: Pt reports wearing seatbelt when in vehicles. Does not drive.   Hobbies: word searches, church, time with family and friends, cooking, walking when she can; going out shopping.   Drinks occasional soda; drinks a lot of  water and some kool-aid; does drink tea      *Update as of 04/19/2019*   Patient has not able to walk around her neighborhood, or down her street, due to COVID. Patient is eager to get back to this once she feels "safe" to leave her house.    Social Drivers of Corporate investment banker Strain: Low Risk  (07/09/2023)   Overall Financial Resource Strain (CARDIA)    Difficulty of Paying Living Expenses: Not hard at all  Food Insecurity: No Food Insecurity (07/09/2023)   Hunger Vital Sign    Worried About Running Out of Food in the Last Year: Never true    Ran Out of Food in the Last Year: Never true  Transportation Needs: No Transportation Needs (07/09/2023)   PRAPARE - Administrator, Civil Service (Medical): No    Lack of Transportation (Non-Medical): No  Physical Activity: Insufficiently Active (07/09/2023)   Exercise Vital Sign    Days of Exercise per Week: 2 days    Minutes of Exercise per Session: 30 min  Stress: No Stress Concern Present (07/09/2023)   [employer] of Occupational Health - Occupational Stress Questionnaire    Feeling of Stress : Not at all  Social Connections: Moderately Integrated (07/09/2023)   Social Connection and Isolation Panel [NHANES]    Frequency of Communication with Friends and Family: More than three times a week    Frequency of Social Gatherings with Friends and Family: Three times a week    Attends Religious Services: More than 4 times per year    Active Member of Clubs or Organizations: Yes    Attends Banker Meetings: More than 4 times per year    Marital Status: Widowed   Family History  Problem Relation Age of Onset   Diabetes Mother    Stroke Mother    Heart disease Father    Anemia Father    Esophageal cancer Sister    Cancer Sister 17       nose cancer    Diabetes Sister        2 sisters type 1 diabetes    Kidney disease Sister        waiting for a kidney transplant- on dialysis   Diabetes Brother         Type 2 diabetic    Diabetes Son        Type 2    Hypertension Son    Cancer Cousin 30       ovarian cancer    Cancer Cousin 49       ovarian cancer    Colon cancer Neg Hx    Rectal cancer Neg Hx    Stomach cancer Neg Hx    No Known  Allergies Current Outpatient Medications  Medication Sig Dispense Refill   acetaminophen  (TYLENOL ) 650 MG CR tablet Take 650-1,300 mg by mouth See admin instructions. Take 1300 mg by mouth in the morning, 650 mg in the afternoon and 1300 mg at night     allopurinol  (ZYLOPRIM ) 100 MG tablet TAKE 1 TABLET BY MOUTH DAILY 100 tablet 2   aspirin  EC 325 MG tablet Take 1 tablet (325 mg total) by mouth daily. (Patient not taking: Reported on 03/01/2024) 14 tablet 0   atorvastatin  (LIPITOR) 40 MG tablet TAKE 1 TABLET BY MOUTH DAILY 100 tablet 2   blood glucose meter kit and supplies Dispense based on patient and insurance preference.Use to check blood sugar in the morning. (FOR ICD-10 E10.9, E11.9).  Onetouch Verio is what she has been using. 1 each 0   Blood Glucose Monitoring Suppl (ONETOUCH VERIO) w/Device KIT Use to check blood glucose every morning. E11.9 1 kit 0   cholecalciferol (VITAMIN D3) 25 MCG (1000 UNIT) tablet Take 2,000 Units by mouth daily.     colchicine  0.6 MG tablet TAKE 1 TABLET BY MOUTH DAILY (Patient taking differently: Take 0.6 mg by mouth at bedtime.) 100 tablet 2   dorzolamide (TRUSOPT) 2 % ophthalmic solution Place 1 drop into both eyes 2 (two) times daily.     fluticasone  furoate-vilanterol (BREO ELLIPTA ) 200-25 MCG/ACT AEPB Inhale 1 puff into the lungs at bedtime. 90 each 0   gabapentin  (NEURONTIN ) 100 MG capsule TAKE 1 CAPSULE BY MOUTH AT  BEDTIME 100 capsule 2   Insulin  Pen Needle (EASY COMFORT PEN NEEDLES) 31G X 8 MM MISC USE TO INJECT INSULIN  EVERY DAY AS DIRECTED 100 each 3   JARDIANCE  25 MG TABS tablet TAKE 1 TABLET BY MOUTH DAILY 100 tablet 2   Lancet Devices (ONETOUCH DELICA PLUS LANCING) MISC USE ONCE DAILY 100 each 3   Lancets  (ONETOUCH DELICA PLUS LANCET33G) MISC USE DAILY AS NEEDED. 100 each prn   LANTUS  SOLOSTAR 100 UNIT/ML Solostar Pen INJECT 30 UNITS EACH MORNING     latanoprost (XALATAN) 0.005 % ophthalmic solution Place 1 drop into both eyes at bedtime.   0   losartan  (COZAAR ) 100 MG tablet TAKE 1 TABLET BY MOUTH AT  BEDTIME 100 tablet 2   metoprolol  succinate (TOPROL -XL) 25 MG 24 hr tablet Take 0.5 tablets (12.5 mg total) by mouth daily. 90 tablet 3   ONETOUCH VERIO test strip CHECK BLOOD SUGAR IN THE MORNING AS DIRECTED 100 strip 2   Semaglutide , 2 MG/DOSE, (OZEMPIC , 2 MG/DOSE,) 8 MG/3ML SOPN Inject 2 mg into the skin once a week. 9 mL 0   spironolactone  (ALDACTONE ) 25 MG tablet TAKE 1 TABLET BY MOUTH DAILY 100 tablet 2   trolamine salicylate (ASPERCREME) 10 % cream Apply 1 Application topically as needed for muscle pain.     No current facility-administered medications for this visit.   No results found.  Review of Systems:   A ROS was performed including pertinent positives and negatives as documented in the HPI.   Musculoskeletal Exam:    There were no vitals taken for this visit.  Left shoulder incision is well-appearing without erythema or drainage.  Sensation is intact in axillary distribution equal to the contralateral side.  She does fire all 3 heads of the deltoid with forward elevation.  Forward elevation is to 20 degrees actively with external rotation of the side to neutral.  Imaging:    2 views left shoulder: Status post reverse shoulder arthroplasty without evidence of  complication  I personally reviewed and interpreted the radiographs.   Assessment:   63-month status post left shoulder reverse shoulder arthroplasty.  This time she is satisfied without any pain.  She is continuing to work on active ranges of motion overhead which is coming along slowly.  I will plan to see her back as needed  Plan :    -Return to clinic as needed      I personally saw and evaluated the  patient, and participated in the management and treatment plan.  Wilhelmenia Harada, MD Attending Physician, Orthopedic Surgery  This document was dictated using Dragon voice recognition software. A reasonable attempt at proof reading has been made to minimize errors.

## 2024-04-02 ENCOUNTER — Telehealth: Payer: Self-pay

## 2024-04-02 NOTE — Telephone Encounter (Signed)
 Called [employer] program; offered PREP class in May at [place], she doesn't think she can schedule transportation to this location, but does have access to [place]; wants to wait to start once classes resume at [place].

## 2024-04-25 ENCOUNTER — Other Ambulatory Visit: Payer: Self-pay | Admitting: Family Medicine

## 2024-04-25 DIAGNOSIS — M1000 Idiopathic gout, unspecified site: Secondary | ICD-10-CM

## 2024-04-27 ENCOUNTER — Other Ambulatory Visit: Payer: Self-pay | Admitting: Family Medicine

## 2024-04-27 DIAGNOSIS — E1169 Type 2 diabetes mellitus with other specified complication: Secondary | ICD-10-CM

## 2024-04-27 DIAGNOSIS — E785 Hyperlipidemia, unspecified: Secondary | ICD-10-CM

## 2024-05-09 ENCOUNTER — Telehealth: Payer: Self-pay

## 2024-05-09 NOTE — Telephone Encounter (Signed)
 Unable to get in contact with patient due to all her phone numbers on file not working or went straight to VM, spoke with patient's son Bearl Limes) he confirmed that we he let is mother know to schedule an appointment to get her supplies. Alain Howard CMA

## 2024-05-09 NOTE — Telephone Encounter (Signed)
-----   Message from Wilhemena Harbour sent at 05/07/2024  4:01 PM EDT ----- Regarding: Follow up Appt for urinary incontinence Hey team!  Can we give this patient a call and get them scheduled for a follow up appointment about their urinary incontinence. They have a Rx for supplies and they need to be seen about it, in the last 6 months.   Appt can be in person or virtual.  Thank you for your help!  -BS

## 2024-05-10 NOTE — Telephone Encounter (Signed)
 Patient LVM on nurse line reporting she missed a phone call.   Attempted to call patient back to schedule and apt to discuss IC.   However, phone number given to me by patient went straight to VM.

## 2024-05-11 ENCOUNTER — Telehealth: Payer: Self-pay | Admitting: Family Medicine

## 2024-05-11 NOTE — Telephone Encounter (Signed)
 Received request from [facility] for incontinence supplies. Patient does not have updated office notes describing need in last 6 months. Red team: please schedule appt with PCP for this. Forms placed in PCP box for completion once documentation entered.

## 2024-05-16 NOTE — Telephone Encounter (Signed)
-----   Message from [place] sent at 05/15/2024  5:13 PM EDT ----- Regarding: schedule appt Hello,   Could you please schedule an appointment for this patient to discuss urinary incontinence, so that we can send records for the DME company that is providing her with supplies for urinary incontinence?  Thank you,  Suzen Escort

## 2024-05-18 ENCOUNTER — Other Ambulatory Visit: Payer: Self-pay | Admitting: Family Medicine

## 2024-05-18 DIAGNOSIS — I10 Essential (primary) hypertension: Secondary | ICD-10-CM

## 2024-05-28 ENCOUNTER — Telehealth: Payer: Self-pay

## 2024-05-28 NOTE — Telephone Encounter (Signed)
 Voicemail box full, tried home number as well with no answer.

## 2024-05-30 ENCOUNTER — Other Ambulatory Visit: Payer: Self-pay | Admitting: Family Medicine

## 2024-06-06 ENCOUNTER — Encounter: Payer: Self-pay | Admitting: Podiatry

## 2024-06-06 ENCOUNTER — Ambulatory Visit (INDEPENDENT_AMBULATORY_CARE_PROVIDER_SITE_OTHER): Admitting: Podiatry

## 2024-06-06 DIAGNOSIS — E1140 Type 2 diabetes mellitus with diabetic neuropathy, unspecified: Secondary | ICD-10-CM | POA: Diagnosis not present

## 2024-06-06 DIAGNOSIS — B351 Tinea unguium: Secondary | ICD-10-CM | POA: Diagnosis not present

## 2024-06-06 NOTE — Progress Notes (Signed)
This patient returns to my office for at risk foot care.  This patient requires this care by a professional since this patient will be at risk due to having diabetes.   This patient is unable to cut nails herself since the patient cannot reach her nails.These nails are painful walking and wearing shoes.  This patient presents for at risk foot care today.  General Appearance  Alert, conversant and in no acute stress.  Vascular  Dorsalis pedis and posterior tibial  pulses are palpable  bilaterally.  Capillary return is within normal limits  bilaterally. Temperature is within normal limits  bilaterally.  Neurologic  Senn-Weinstein monofilament wire test absent   bilaterally. Muscle power within normal limits bilaterally.  Nails Thick disfigured discolored nails with subungual debris  from hallux to fifth toes bilaterally. No evidence of bacterial infection or drainage bilaterally.  Orthopedic  No limitations of motion  feet .  No crepitus or effusions noted.  No bony pathology or digital deformities noted. Asymptomatic  HAV  B/L.  Pes planus  B/L. Hammer toes second left foot. Contracted digits  B/L.  Skin  normotropic skin with no porokeratosis noted bilaterally.  No signs of infections or ulcers noted.     Onychomycosis  Pain in right toes  Pain in left toes    Consent was obtained for treatment procedures.   Mechanical debridement of nails 1-5  bilaterally performed with a nail nipper.  Filed with dremel without incident.    Return office visit  3 months                    Told patient to return for periodic foot care and evaluation due to potential at risk complications   Gardiner Barefoot DPM

## 2024-06-08 ENCOUNTER — Other Ambulatory Visit: Payer: Self-pay | Admitting: Student

## 2024-06-08 DIAGNOSIS — M1000 Idiopathic gout, unspecified site: Secondary | ICD-10-CM

## 2024-06-11 ENCOUNTER — Ambulatory Visit (INDEPENDENT_AMBULATORY_CARE_PROVIDER_SITE_OTHER): Admitting: Family Medicine

## 2024-06-11 ENCOUNTER — Encounter: Payer: Self-pay | Admitting: Family Medicine

## 2024-06-11 VITALS — BP 134/71 | HR 73 | Ht 62.0 in | Wt 210.0 lb

## 2024-06-11 DIAGNOSIS — M21969 Unspecified acquired deformity of unspecified lower leg: Secondary | ICD-10-CM

## 2024-06-11 DIAGNOSIS — R159 Full incontinence of feces: Secondary | ICD-10-CM | POA: Diagnosis not present

## 2024-06-11 DIAGNOSIS — N39498 Other specified urinary incontinence: Secondary | ICD-10-CM | POA: Diagnosis not present

## 2024-06-11 DIAGNOSIS — R152 Fecal urgency: Secondary | ICD-10-CM

## 2024-06-11 DIAGNOSIS — E1169 Type 2 diabetes mellitus with other specified complication: Secondary | ICD-10-CM

## 2024-06-11 LAB — POCT GLYCOSYLATED HEMOGLOBIN (HGB A1C): HbA1c, POC (controlled diabetic range): 7.2 % — AB (ref 0.0–7.0)

## 2024-06-11 NOTE — Progress Notes (Signed)
    SUBJECTIVE:   CHIEF COMPLAINT / HPI:   Diabetes  Checks BS once every morning. On 30 units lantus  once a day, jardiance , and ozempic . No issues with any of the medications. No hypoglycemic episodes.  Sees a podiatrist for foot care.   Incontinence  Has been incontinent for about 4 years. Feels like she leaks all the time. Not just after coughing or sneezing. Most of the time has incontinence where she can't make it to the bathroom in time. A lot of it worsened after starting diuretics. Has never had a vaginal delivery.   Stool incontinence  Started 3 years ago. After she eats something or when she goes out she has stool incontinence. Happens couple times a week. No blood in the stool no weight loss. Has not seen GI before.  Has had loose stool in the past with metformin ; however, improved after she was taken off.  Does not have incontinence over night.  Has not gone out of the country.  Avoids going out in case she has incontinence and avoids eating if she does have to go out with fear of having incontinence.   PERTINENT  PMH / PSH: HTN, CHF, T2DM   OBJECTIVE:   BP 134/71   Pulse 73   Ht 5' 2 (1.575 m)   Wt 210 lb (95.3 kg)   SpO2 100%   BMI 38.41 kg/m   General: well appearing, in no acute distress CV: RRR, radial pulses equal and palpable Resp: Normal work of breathing on room air, CTAB Abd: Soft, non tender, non distended  Neuro: Alert & Oriented x 4, EOMI, 5/5 BUE strength, 5/5 BLE strength, normal sensation throughout  Ext: 1+ pitting edema to the knees with knee high socks.   ASSESSMENT/PLAN:   Assessment & Plan Type 2 diabetes mellitus with diabetic foot deformity (HCC) A1c slightly improved at 7.2 from 7.5 and within goal given patient's age.  Controlled at this time.  - Continue lantus , ozempic , jardiance   Other urinary incontinence Most likely urge incontinence given history. Could be due to weak pelvic floor and exacerbated by diuretics. Would prefer not  to dc diuretics at this time given CHF and stable fluid balance with this regimen.  - Referral to urology. - DME order for depends  Incontinence of feces with fecal urgency Unlikely due to a neurologic issue given normal neurologic exam. No alarm signs and normal colonoscopy in 2022. Not on medication that would cause fecal incontinence. Could be SIBO. Unlikely chronic giardiasis.  - Given chronicity will refer to GI      Areta Saliva, MD [facility]

## 2024-06-11 NOTE — Assessment & Plan Note (Signed)
 A1c slightly improved at 7.2 from 7.5 and within goal given patient's age.  Controlled at this time.  - Continue lantus , ozempic , jardiance 

## 2024-06-11 NOTE — Patient Instructions (Signed)
 It was wonderful to see you today.  Please bring ALL of your medications with you to every visit.   Today we talked about:  Urinary incontinence - I will submit the forms for the depends and have put in a referral to urology.   Stool incontinence - Please keep a journal of how often you are having incontinence and loose stools. I have put in a referral to a gastroenterologist.   Please follow up in 3 months   Thank you for choosing [facility].   Please call [phone] with any questions about today's appointment.  Please be sure to schedule follow up at the front desk before you leave today.   Areta Saliva, MD  Family Medicine

## 2024-06-12 LAB — MICROALBUMIN / CREATININE URINE RATIO
Creatinine, Urine: 75.2 mg/dL
Microalb/Creat Ratio: 191 mg/g{creat} — ABNORMAL HIGH (ref 0–29)
Microalbumin, Urine: 143.5 ug/mL

## 2024-06-13 ENCOUNTER — Ambulatory Visit: Payer: Self-pay | Admitting: Family Medicine

## 2024-06-15 ENCOUNTER — Telehealth: Payer: Self-pay

## 2024-06-15 ENCOUNTER — Encounter: Payer: Self-pay | Admitting: Advanced Practice Midwife

## 2024-06-15 NOTE — Telephone Encounter (Signed)
 Patient calls nurse line requesting an order for incontinence supplies.   Will forward to PCP to place order.

## 2024-06-21 NOTE — Telephone Encounter (Signed)
 Spoke with patient.   She reports [employer] should be sending over orders for PCP to sign for incontinence supplies.  Please let me know if you have not received the order.   [employer]- [phone]

## 2024-06-22 NOTE — Telephone Encounter (Signed)
 [employer] reports they did receive the order, however the date was inaccurate.   She reports the provider wrote 2015 vs 2025.  She reports she will be sending this back for correction.   Will forward to PCP to make aware.

## 2024-06-26 DIAGNOSIS — H401113 Primary open-angle glaucoma, right eye, severe stage: Secondary | ICD-10-CM | POA: Diagnosis not present

## 2024-06-26 DIAGNOSIS — H401122 Primary open-angle glaucoma, left eye, moderate stage: Secondary | ICD-10-CM | POA: Diagnosis not present

## 2024-06-28 ENCOUNTER — Other Ambulatory Visit: Payer: Self-pay | Admitting: Family Medicine

## 2024-07-03 NOTE — Telephone Encounter (Signed)
 Patient returns call to nurse line regarding incontinence supplies.   Dr. Nicholas- did you ever receive the forms for corrections?   Kirsten JAYSON English, RN

## 2024-07-07 ENCOUNTER — Other Ambulatory Visit: Payer: Self-pay | Admitting: Family Medicine

## 2024-07-07 DIAGNOSIS — E1169 Type 2 diabetes mellitus with other specified complication: Secondary | ICD-10-CM

## 2024-07-09 ENCOUNTER — Telehealth: Payer: Self-pay

## 2024-07-09 ENCOUNTER — Other Ambulatory Visit: Payer: Self-pay | Admitting: Family Medicine

## 2024-07-09 NOTE — Telephone Encounter (Signed)
 Patient calls nurse line requesting an alternative to Breo inhaler.   She states that Breo inhaler does not have an indicator that shows when she is almost out. She would like to receive inhaler that is more user friendly.   Advised that I was not sure what options may be available, however, would reach out to PCP and Dr. Koval.   Please advise.   Chiquita JAYSON English, RN

## 2024-07-13 ENCOUNTER — Other Ambulatory Visit: Payer: Self-pay

## 2024-07-13 DIAGNOSIS — E1169 Type 2 diabetes mellitus with other specified complication: Secondary | ICD-10-CM

## 2024-07-13 NOTE — Telephone Encounter (Signed)
 Attempted to contact patient for follow-up of lack of counter on Ellipta devices.   Patient concern related to no counter on BREO Ellipta   There is a counter on the BREO Ellipta  inhaler.   Attempted to contact patient, left HIPAA compliant voice mail requesting call back to direct phone: [phone]  Attempted second call later in the day - no answer.   Total time with patient call and documentation of interaction: 9 minutes.

## 2024-07-13 NOTE — Telephone Encounter (Signed)
 Reviewed and agree with Dr Rennis plan.

## 2024-07-14 MED ORDER — ONETOUCH DELICA PLUS LANCET33G MISC: 1.0000 | Freq: Every day | 99 refills | Status: DC | PRN

## 2024-07-23 NOTE — Telephone Encounter (Signed)
 Attempted to contact patient for follow-up of question related to counter on BREO  Third and final attempt to reach.   Left HIPAA compliant voice mail requesting call back to direct phone: [phone]  Also attempted to contact via MyChart message Total time with patient call and documentation of interaction: 9 minutes.  Follow-up phone call planned: None at this time.

## 2024-07-25 ENCOUNTER — Other Ambulatory Visit: Payer: Self-pay

## 2024-07-25 DIAGNOSIS — E1169 Type 2 diabetes mellitus with other specified complication: Secondary | ICD-10-CM

## 2024-07-26 MED ORDER — ACCU-CHEK SOFTCLIX LANCETS MISC: 12 refills | Status: AC

## 2024-07-26 MED ORDER — ACCU-CHEK GUIDE W/DEVICE KIT
PACK | 0 refills | Status: AC
Start: 2024-07-26 — End: ?

## 2024-07-26 MED ORDER — ACCU-CHEK GUIDE TEST VI STRP
ORAL_STRIP | 12 refills | Status: AC
Start: 2024-07-26 — End: ?

## 2024-07-26 MED ORDER — FLUTICASONE FUROATE-VILANTEROL 200-25 MCG/ACT IN AEPB: 1.0000 | INHALATION_SPRAY | Freq: Every day | RESPIRATORY_TRACT | 0 refills | Status: DC

## 2024-08-17 ENCOUNTER — Ambulatory Visit (INDEPENDENT_AMBULATORY_CARE_PROVIDER_SITE_OTHER)
Admission: RE | Admit: 2024-08-17 | Discharge: 2024-08-17 | Disposition: A | Discharge status: Home / Self Care | Source: Ambulatory Visit | Attending: Gastroenterology | Admitting: Gastroenterology

## 2024-08-17 ENCOUNTER — Ambulatory Visit (INDEPENDENT_AMBULATORY_CARE_PROVIDER_SITE_OTHER): Admitting: Gastroenterology

## 2024-08-17 ENCOUNTER — Encounter: Payer: Self-pay | Admitting: Gastroenterology

## 2024-08-17 VITALS — BP 136/86 | HR 62 | Ht 62.0 in | Wt 209.5 lb

## 2024-08-17 DIAGNOSIS — Z860100 Personal history of colon polyps, unspecified: Secondary | ICD-10-CM | POA: Diagnosis not present

## 2024-08-17 DIAGNOSIS — R194 Change in bowel habit: Secondary | ICD-10-CM

## 2024-08-17 DIAGNOSIS — R152 Fecal urgency: Secondary | ICD-10-CM | POA: Diagnosis not present

## 2024-08-17 DIAGNOSIS — R159 Full incontinence of feces: Secondary | ICD-10-CM

## 2024-08-17 DIAGNOSIS — R109 Unspecified abdominal pain: Secondary | ICD-10-CM | POA: Diagnosis not present

## 2024-08-17 NOTE — Progress Notes (Signed)
 Chief Complaint:Incontinence of feces with fecal urgency  Primary GI Doctor: (previously Dr. Teressa) Dr. Charlanne  HPI:  Patient is a  73  year old female patient with past medical history of history of Breast CA (2015), DM type 2, and hypertension, who was referred to me by Nicholas Bar, MD on 06/11/24 for a evaluation of incontinence of feces with fecal urgency  .    Interval History    Patient presents for evaluation of fecal incontinence with urgency. She notes history of constipation and takes OTC laxative pill few times a month. She cannot recall name of medication.  Patient reports she has had intermittent loose stools for approximately the last 3 years. She has postprandial bowel movements that are urgent. She has had accidents not making it to the restroom in time. No known food triggers. She is unsure if any of her medications were started around the time these symptoms started. Intermittent abdominal discomfort. She has incontinence of urine at times as well.   Patient not on any blood thinners.   She does not take any NSAID's.  Occasional nausea in early am. Appetite good.   Surgical history: lap chole (20 years ago)  Patient's family history includes  GI procedures: 11/03/21 colonoscopy, recall 3 years - Five 3 to 9 mm polyps in the transverse colon, in the ascending colon and in the cecum, removed with a cold snare. Resected and retrieved. - One 10 mm polyp in the descending colon, removed with a cold snare. Resected and retrieved. - One 14 mm polyp in the proximal sigmoid colon, removed with a hot snare. Resected and retrieved. - Diverticulosis in the left colon. - External and internal hemorrhoids. - The examination was otherwise normal. Path: Diagnosis 1. Surgical [P], colon, transverse-3, ascending, and cecum, polyp (5) TUBULAR ADENOMAS. NEGATIVE FOR HIGH-GRADE DYSPLASIA. 2. Surgical [P], colon, descending, polyp (1) TUBULAR ADENOMA. NEGATIVE FOR HIGH-GRADE  DYSPLASIA. 3. Surgical [P], colon, sigmoid, polyp (1) PEDUNCULATED TUBULAR ADENOMA. NEGATIVE FOR HIGH-GRADE DYSPLASIA. THE PEDICLE HAS NORMAL COLONIC MUCOSA.  Wt Readings from Last 3 Encounters:  08/17/24 209 lb 8 oz (95 kg)  06/11/24 210 lb (95.3 kg)  03/01/24 210 lb 12.8 oz (95.6 kg)    Past Medical History:  Diagnosis Date   Anemia    Arthritis    Asthma    Blood transfusion without reported diagnosis    Patients believes 2015 when she overdosed on medications    Breast cancer of lower-outer quadrant of right female breast (HCC) 02/13/2015   Cataract    CHF, acute (HCC) 05/03/2012   Depression    Diabetes mellitus    type 2   Eczema    Fatty liver    Fatty infiltration of liver noted on 03/2012 CT scan   Fibromyalgia    Glaucoma    bilateral   Heart disease    History of kidney stones    w/ hx of hydronephrosis - followed by [facility]   HIV nonspecific serology    2006: indeterminate HIV blood test, seen by ID, felt secondary to cross reacting antibodies with no further workup felt necessary at that time    Hyperlipidemia    Hypertension    Obesity    Personal history of radiation therapy 2016   right   Radiation 05/07/15-06/23/15   Right Breast    Past Surgical History:  Procedure Laterality Date   BREAST BIOPSY Left 02/10/2015   malignant    BREAST LUMPECTOMY Right 03/21/2015   CHOLECYSTECTOMY  2003   CYSTOSCOPY W/ LITHOLAPAXY / [facility]     LEFT AND RIGHT HEART CATHETERIZATION WITH CORONARY ANGIOGRAM N/A 05/02/2012   Procedure: LEFT AND RIGHT HEART CATHETERIZATION WITH CORONARY ANGIOGRAM;  Surgeon: Lonni JONETTA Cash, MD;  Location: [facility] CATH LAB;  Service: Cardiovascular;  Laterality: N/A;   RADIOACTIVE SEED GUIDED PARTIAL MASTECTOMY WITH AXILLARY SENTINEL LYMPH NODE BIOPSY Right 03/21/2015   Procedure: RIGHT  PARTIAL MASTECTOMY WITH RADIOACTIVE SEED LOCALIZATION RIGHT  AXILLARY SENTINEL LYMPH NODE BIOPSY;  Surgeon: Elon Pacini, MD;  Location: [facility]  [place];  Service: General;  Laterality: Right;   REPLACEMENT TOTAL KNEE BILATERAL  2005 &2006   REVERSE SHOULDER ARTHROPLASTY Left 09/27/2023   Procedure: LEFT REVERSE SHOULDER ARTHROPLASTY;  Surgeon: Genelle Standing, MD;  Location: [facility] OR;  Service: Orthopedics;  Laterality: Left;   VASCULAR SURGERY Right 03/15/2013   Ultrasound guided sclerotherapy    Current Outpatient Medications  Medication Sig Dispense Refill   Accu-Chek Softclix Lancets lancets Please use to check blood sugar levels once daily. 100 each 12   acetaminophen  (TYLENOL ) 650 MG CR tablet Take 650-1,300 mg by mouth See admin instructions. Take 1300 mg by mouth in the morning, 650 mg in the afternoon and 1300 mg at night     allopurinol  (ZYLOPRIM ) 100 MG tablet TAKE 1 TABLET BY MOUTH DAILY 100 tablet 2   atorvastatin  (LIPITOR) 40 MG tablet TAKE 1 TABLET BY MOUTH DAILY 100 tablet 2   blood glucose meter kit and supplies Dispense based on patient and insurance preference.Use to check blood sugar in the morning. (FOR ICD-10 E10.9, E11.9).  Onetouch Verio is what she has been using. 1 each 0   Blood Glucose Monitoring Suppl (ACCU-CHEK GUIDE) w/Device KIT Please use to check blood sugar levels once daily. 1 kit 0   cholecalciferol (VITAMIN D3) 25 MCG (1000 UNIT) tablet Take 2,000 Units by mouth daily.     colchicine  0.6 MG tablet TAKE 1 TABLET BY MOUTH DAILY 30 tablet 11   dorzolamide (TRUSOPT) 2 % ophthalmic solution Place 1 drop into both eyes 2 (two) times daily.     dorzolamide-timolol (COSOPT) 2-0.5 % ophthalmic solution Place 1 drop into both eyes 2 (two) times daily.     EASY COMFORT PEN NEEDLES 31G X 8 MM MISC USE TO INJECT INSULIN  EVERY DAY AS DIRECTED 100 each 3   fluticasone  furoate-vilanterol (BREO ELLIPTA ) 200-25 MCG/ACT AEPB Inhale 1 puff into the lungs at bedtime. 90 each 0   gabapentin  (NEURONTIN ) 100 MG capsule TAKE 1 CAPSULE BY MOUTH AT  BEDTIME 100 capsule 2   glucose blood (ACCU-CHEK GUIDE TEST)  test strip Please use to check blood sugar levels once daily. 100 each 12   JARDIANCE  25 MG TABS tablet TAKE 1 TABLET BY MOUTH DAILY 100 tablet 2   Lancet Devices (ONETOUCH DELICA PLUS LANCING) MISC USE ONCE DAILY 100 each 3   LANTUS  SOLOSTAR 100 UNIT/ML Solostar Pen INJECT SUBCUTANEOUSLY 32 UNITS  EVERY MORNING 45 mL 2   latanoprost (XALATAN) 0.005 % ophthalmic solution Place 1 drop into both eyes at bedtime.   0   losartan  (COZAAR ) 100 MG tablet TAKE 1 TABLET BY MOUTH AT  BEDTIME 100 tablet 2   metoprolol  succinate (TOPROL -XL) 25 MG 24 hr tablet Take 0.5 tablets (12.5 mg total) by mouth daily. 90 tablet 3   Semaglutide , 2 MG/DOSE, (OZEMPIC , 2 MG/DOSE,) 8 MG/3ML SOPN Inject 2 mg into the skin once a week. 9 mL 0   spironolactone  (ALDACTONE ) 25 MG tablet  TAKE 1 TABLET BY MOUTH DAILY 100 tablet 2   trolamine salicylate (ASPERCREME) 10 % cream Apply 1 Application topically as needed for muscle pain.     aspirin  EC 325 MG tablet Take 1 tablet (325 mg total) by mouth daily. (Patient not taking: Reported on 08/17/2024) 14 tablet 0   No current facility-administered medications for this visit.    Allergies as of 08/17/2024   (No Known Allergies)    Family History  Problem Relation Age of Onset   Diabetes Mother    Stroke Mother    Heart disease Father    Anemia Father    Esophageal cancer Sister    Cancer Sister 36       nose cancer    Diabetes Sister        2 sisters type 1 diabetes    Kidney disease Sister        waiting for a kidney transplant- on dialysis   Diabetes Brother        Type 2 diabetic    Diabetes Son        Type 2    Hypertension Son    Cancer Cousin 30       ovarian cancer    Cancer Cousin 49       ovarian cancer    Colon cancer Neg Hx    Rectal cancer Neg Hx    Stomach cancer Neg Hx     Review of Systems:    Constitutional: No weight loss, fever, chills, weakness or fatigue HEENT: Eyes: No change in vision               Ears, Nose, Throat:  No change in  hearing or congestion Skin: No rash or itching Cardiovascular: No chest pain, chest pressure or palpitations   Respiratory: No SOB or cough Gastrointestinal: See HPI and otherwise negative Genitourinary: No dysuria or change in urinary frequency Neurological: No headache, dizziness or syncope Musculoskeletal: No new muscle or joint pain Hematologic: No bleeding or bruising Psychiatric: No history of depression or anxiety    Physical Exam:  Vital signs: BP 136/86 (BP Location: Left Arm, Patient Position: Sitting, Cuff Size: Normal)   Pulse 62   Ht 5' 2 (1.575 m)   Wt 209 lb 8 oz (95 kg)   BMI 38.32 kg/m   Constitutional:   Pleasant  female appears to be in NAD, Well developed, Well nourished, alert and cooperative Throat: Oral cavity and pharynx without inflammation, swelling or lesion.  Respiratory: Respirations even and unlabored. Lungs clear to auscultation bilaterally.   No wheezes, crackles, or rhonchi.  Cardiovascular: Normal S1, S2. Regular rate and rhythm. No peripheral edema, cyanosis or pallor.  Gastrointestinal:  Soft, nondistended, nontender. No rebound or guarding. hypoactive bowel sounds. No appreciable masses or hepatomegaly. Diastasis Recti noted. Rectal:  Not performed.  Msk:  Symmetrical without gross deformities. Without edema, no deformity or joint abnormality.  Neurologic:  Alert and  oriented x4;  grossly normal neurologically.  Skin:   Dry and intact without significant lesions or rashes.  RELEVANT LABS AND IMAGING: CBC    Latest Ref Rng & Units 01/30/2024   10:22 AM 09/27/2023   12:45 PM 12/30/2022    9:13 AM  CBC  WBC 4.0 - 10.5 K/uL 6.6   7.6   Hemoglobin 12.0 - 15.0 g/dL 86.8  86.0  86.8   Hematocrit 36.0 - 46.0 % 41.7  41.0  40.9   Platelets 150 - 400 K/uL 153  160      CMP     Latest Ref Rng & Units 01/30/2024   10:22 AM 09/27/2023   12:45 PM 09/15/2023    1:30 PM  CMP  Glucose 70 - 99 mg/dL 849  884  888   BUN 8 - 23 mg/dL 26  27  23     Creatinine 0.44 - 1.00 mg/dL 8.71  8.59  8.77   Sodium 135 - 145 mmol/L 139  139  138   Potassium 3.5 - 5.1 mmol/L 4.4  4.2  4.4   Chloride 98 - 111 mmol/L 103  104  101   CO2 22 - 32 mmol/L 30   24   Calcium  8.9 - 10.3 mg/dL 9.5   9.6   Total Protein 6.5 - 8.1 g/dL 7.5     Total Bilirubin 0.0 - 1.2 mg/dL 0.6     Alkaline Phos 38 - 126 U/L 94     AST 15 - 41 U/L 16     ALT 0 - 44 U/L 13        Lab Results  Component Value Date   TSH 1.210 01/06/2018  04/2016 echo- The estimated ejection fraction was in the range of 50%  to 55%.  Assessment: Encounter Diagnoses  Name Primary?   Incontinence of feces with fecal urgency Yes   History of colonic polyps     73 year old female patient with history of constipation who presents with intermittent episodes of loose stools with urgency and incontinence.  I suspect her symptoms are most consistent with obstipation.  Will go ahead and order abdominal x-ray two-view to rule out stool burden.  If positive for constipation we will start patient on daily MiraLAX.  Today we will have patient start psyllium husk 1 teaspoon p.o. daily to see if this helps with some of the stool leakage.  If negative x-ray Iona Stay consider adding cholestyramine at bedtime to help with the stools. Patient also due for colon screening colonoscopy for history of tubular adenomas in December 2025, will place recall and schedule 2 day prep with Dr. Charlanne.  Plan: - Check abdominal xray 2 view r/o obstipation -Start psyllium husk 1 tsp po daily  - Colonoscopy recall for December 2025 for history of tubular adenomas  Thank you for the courtesy of this consult. Please call me with any questions or concerns.   Lucus Lambertson, FNP-C [facility] 08/17/2024, 9:19 AM  Cc: Nicholas Bar, MD

## 2024-08-17 NOTE — Patient Instructions (Addendum)
 Stool incontinence Start psyllium husk 1 tsp po daily  Recommend high fiber diet Check xray, will call you with results  Your provider has requested that you have an abdominal x ray before leaving today. Please go to the basement floor to our Radiology department for the test.  _______________________________________________________  If your blood pressure at your visit was 140/90 or greater, please contact your primary care physician to follow up on this.  _______________________________________________________  If you are age 38 or older, your body mass index should be between 23-30. Your Body mass index is 38.32 kg/m. If this is out of the aforementioned range listed, please consider follow up with your Primary Care Provider.  If you are age 34 or younger, your body mass index should be between 19-25. Your Body mass index is 38.32 kg/m. If this is out of the aformentioned range listed, please consider follow up with your Primary Care Provider.   ________________________________________________________  The [place] [facility] providers would like to encourage you to use MYCHART to communicate with providers for non-urgent requests or questions.  Due to long hold times on the telephone, sending your provider a message by [facility] may be a faster and more efficient way to get a response.  Please allow 48 business hours for a response.  Please remember that this is for non-urgent requests.  _______________________________________________________  [facility] is using a team-based approach to care.  Your team is made up of your doctor and two to three APPS. Our APPS (Nurse Practitioners and Physician Assistants) work with your physician to ensure care continuity for you. They are fully qualified to address your health concerns and develop a treatment plan. They communicate directly with your gastroenterologist to care for you. Seeing the Advanced Practice Practitioners on your physician's  team can help you by facilitating care more promptly, often allowing for earlier appointments, access to diagnostic testing, procedures, and other specialty referrals.   Thank you for trusting me with your gastrointestinal care. Deanna May, FNP-C

## 2024-08-20 ENCOUNTER — Telehealth: Payer: Self-pay | Admitting: Pharmacist

## 2024-08-20 NOTE — Telephone Encounter (Signed)
 Reviewed and agree with Dr Rennis plan.

## 2024-08-20 NOTE — Telephone Encounter (Signed)
 Patient contacts office to report she now has a BREO (VILANTEROL / FLUTICASONE )  inhaler Pump and desires re-instruction on how to use the inhaler  We reviewed the technique over the phone, patient takes dose at night, and plans to try use later today.   Patient requested to call me back if the device fails to count down while using.   Plan to reevaluate technique and reteach at next planned PCP visit 10/16 or sooner if needed/requested.   Total time with patient call and documentation of interaction: 9 minutes.

## 2024-08-23 ENCOUNTER — Ambulatory Visit: Payer: Self-pay | Admitting: Gastroenterology

## 2024-09-03 ENCOUNTER — Ambulatory Visit

## 2024-09-03 VITALS — Ht 62.0 in | Wt 209.0 lb

## 2024-09-03 DIAGNOSIS — Z0000 Encounter for general adult medical examination without abnormal findings: Secondary | ICD-10-CM

## 2024-09-03 NOTE — Patient Instructions (Signed)
 [name],  Thank you for taking the time for your Medicare Wellness Visit. I appreciate your continued commitment to your health goals. Please review the care plan we discussed, and feel free to reach out if I can assist you further.  Medicare recommends these wellness visits once per year to help you and your care team stay ahead of potential health issues. These visits are designed to focus on prevention, allowing your provider to concentrate on managing your acute and chronic conditions during your regular appointments.  Please note that Annual Wellness Visits do not include a physical exam. Some assessments may be limited, especially if the visit was conducted virtually. If needed, we may recommend a separate in-person follow-up with your provider.  Ongoing Care Seeing your primary care provider every 3 to 6 months helps us  monitor your health and provide consistent, personalized care.   Referrals If a referral was made during today's visit and you haven't received any updates within two weeks, please contact the referred provider directly to check on the status.  Recommended Screenings:  Health Maintenance  Topic Date Due   Flu Shot  06/29/2024   COVID-19 Vaccine (8 - 2025-26 season) 07/30/2024   Colon Cancer Screening  11/03/2024   Hemoglobin A1C  12/12/2024   Eye exam for diabetics  12/26/2024   Yearly kidney function blood test for diabetes  01/29/2025   Complete foot exam   01/30/2025   Breast Cancer Screening  02/13/2025   Yearly kidney health urinalysis for diabetes  06/11/2025   Medicare Annual Wellness Visit  09/03/2025   Pneumococcal Vaccine for age over 44  Completed   DEXA scan (bone density measurement)  Completed   Hepatitis C Screening  Completed   Zoster (Shingles) Vaccine  Completed   Meningitis B Vaccine  Aged Out   DTaP/Tdap/Td vaccine  Discontinued       09/03/2024    1:15 PM  Advanced Directives  Does Patient Have a Medical Advance Directive? Yes   Type of Estate agent of [place];Living will  Does patient want to make changes to medical advance directive? No - Patient declined  Copy of Healthcare Power of Attorney in Chart? Yes - validated most recent copy scanned in chart (See row information)   Advance Care Planning is important because it: Ensures you receive medical care that aligns with your values, goals, and preferences. Provides guidance to your family and loved ones, reducing the emotional burden of decision-making during critical moments.  Information on Advanced Care Planning can be found at [place]  [employer] Advance Health Care Directives Advance Health Care Directives ([email or web address]   Vision: Annual vision screenings are recommended for early detection of glaucoma, cataracts, and diabetic retinopathy. These exams can also reveal signs of chronic conditions such as diabetes and high blood pressure.  Dental: Annual dental screenings help detect early signs of oral cancer, gum disease, and other conditions linked to overall health, including heart disease and diabetes.  Please see the attached documents for additional preventive care recommendations.

## 2024-09-03 NOTE — Progress Notes (Signed)
 Subjective:   [name] is a 73 y.o. who presents for a Medicare Wellness preventive visit.  As a reminder, Annual Wellness Visits don't include a physical exam, and some assessments may be limited, especially if this visit is performed virtually. We may recommend an in-person follow-up visit with your provider if needed.  Visit Complete: Virtual I connected with  [name] on 09/03/24 by a audio enabled telemedicine application and verified that I am speaking with the correct person using two identifiers.  Patient Location: Home  Provider Location: Home Office  I discussed the limitations of evaluation and management by telemedicine. The patient expressed understanding and agreed to proceed.  Vital Signs: Because this visit was a virtual/telehealth visit, some criteria may be missing or patient reported. Any vitals not documented were not able to be obtained and vitals that have been documented are patient reported.  VideoDeclined- This patient declined Librarian, academic. Therefore the visit was completed with audio only.  Persons Participating in Visit: Patient.  AWV Questionnaire: No: Patient Medicare AWV questionnaire was not completed prior to this visit.  Cardiac Risk Factors include: advanced age (>46men, >37 women);diabetes mellitus;dyslipidemia;hypertension     Objective:    Today's Vitals   09/03/24 1310  Weight: 209 lb (94.8 kg)  Height: 5' 2 (1.575 m)   Body mass index is 38.23 kg/m.     09/03/2024    1:15 PM 06/11/2024    9:30 AM 03/01/2024    8:37 AM 10/19/2023   10:59 AM 09/20/2023    7:51 AM 09/13/2023   10:17 AM 07/09/2023    5:45 PM  Advanced Directives  Does Patient Have a Medical Advance Directive? Yes No Yes No Yes No Yes  Type of Estate agent of [place];Living will  Healthcare Power of [employer] Power of [employer] Power of [place];Living will  Does patient want to  make changes to medical advance directive? No - Patient declined  No - Patient declined  No - Patient declined  No - Patient declined  Copy of Healthcare Power of Attorney in Chart? Yes - validated most recent copy scanned in chart (See row information)  No - copy requested  No - copy requested No - copy requested Yes - validated most recent copy scanned in chart (See row information)  Would patient like information on creating a medical advance directive?    No - Patient declined  No - Patient declined No - Patient declined    Current Medications (verified) Outpatient Encounter Medications as of 09/03/2024  Medication Sig   Accu-Chek Softclix Lancets lancets Please use to check blood sugar levels once daily.   acetaminophen  (TYLENOL ) 650 MG CR tablet Take 650-1,300 mg by mouth See admin instructions. Take 1300 mg by mouth in the morning, 650 mg in the afternoon and 1300 mg at night   allopurinol  (ZYLOPRIM ) 100 MG tablet TAKE 1 TABLET BY MOUTH DAILY   aspirin  EC 325 MG tablet Take 1 tablet (325 mg total) by mouth daily.   atorvastatin  (LIPITOR) 40 MG tablet TAKE 1 TABLET BY MOUTH DAILY   blood glucose meter kit and supplies Dispense based on patient and insurance preference.Use to check blood sugar in the morning. (FOR ICD-10 E10.9, E11.9).  Onetouch Verio is what she has been using.   Blood Glucose Monitoring Suppl (ACCU-CHEK GUIDE) w/Device KIT Please use to check blood sugar levels once daily.   cholecalciferol (VITAMIN D3) 25 MCG (1000 UNIT) tablet  Take 2,000 Units by mouth daily.   colchicine  0.6 MG tablet TAKE 1 TABLET BY MOUTH DAILY   dorzolamide (TRUSOPT) 2 % ophthalmic solution Place 1 drop into both eyes 2 (two) times daily.   dorzolamide-timolol (COSOPT) 2-0.5 % ophthalmic solution Place 1 drop into both eyes 2 (two) times daily.   EASY COMFORT PEN NEEDLES 31G X 8 MM MISC USE TO INJECT INSULIN  EVERY DAY AS DIRECTED   fluticasone  furoate-vilanterol (BREO ELLIPTA ) 200-25 MCG/ACT AEPB  Inhale 1 puff into the lungs at bedtime.   gabapentin  (NEURONTIN ) 100 MG capsule TAKE 1 CAPSULE BY MOUTH AT  BEDTIME   glucose blood (ACCU-CHEK GUIDE TEST) test strip Please use to check blood sugar levels once daily.   JARDIANCE  25 MG TABS tablet TAKE 1 TABLET BY MOUTH DAILY   Lancet Devices (ONETOUCH DELICA PLUS LANCING) MISC USE ONCE DAILY   LANTUS  SOLOSTAR 100 UNIT/ML Solostar Pen INJECT SUBCUTANEOUSLY 32 UNITS  EVERY MORNING   latanoprost (XALATAN) 0.005 % ophthalmic solution Place 1 drop into both eyes at bedtime.    losartan  (COZAAR ) 100 MG tablet TAKE 1 TABLET BY MOUTH AT  BEDTIME   metoprolol  succinate (TOPROL -XL) 25 MG 24 hr tablet Take 0.5 tablets (12.5 mg total) by mouth daily.   Semaglutide , 2 MG/DOSE, (OZEMPIC , 2 MG/DOSE,) 8 MG/3ML SOPN Inject 2 mg into the skin once a week.   spironolactone  (ALDACTONE ) 25 MG tablet TAKE 1 TABLET BY MOUTH DAILY   trolamine salicylate (ASPERCREME) 10 % cream Apply 1 Application topically as needed for muscle pain.   No facility-administered encounter medications on file as of 09/03/2024.    Allergies (verified) Patient has no known allergies.   History: Past Medical History:  Diagnosis Date   Anemia    Arthritis    Asthma    Blood transfusion without reported diagnosis    Patients believes 2015 when she overdosed on medications    Breast cancer of lower-outer quadrant of right female breast (HCC) 02/13/2015   Cataract    CHF, acute (HCC) 05/03/2012   Depression    Diabetes mellitus    type 2   Eczema    Fatty liver    Fatty infiltration of liver noted on 03/2012 CT scan   Fibromyalgia    Glaucoma    bilateral   Heart disease    History of kidney stones    w/ hx of hydronephrosis - followed by [facility]   HIV nonspecific serology    2006: indeterminate HIV blood test, seen by ID, felt secondary to cross reacting antibodies with no further workup felt necessary at that time    Hyperlipidemia    Hypertension    Obesity     Personal history of radiation therapy 2016   right   Radiation 05/07/15-06/23/15   Right Breast   Past Surgical History:  Procedure Laterality Date   BREAST BIOPSY Left 02/10/2015   malignant    BREAST LUMPECTOMY Right 03/21/2015   CHOLECYSTECTOMY  2003   CYSTOSCOPY W/ LITHOLAPAXY / [facility]     LEFT AND RIGHT HEART CATHETERIZATION WITH CORONARY ANGIOGRAM N/A 05/02/2012   Procedure: LEFT AND RIGHT HEART CATHETERIZATION WITH CORONARY ANGIOGRAM;  Surgeon: Lonni JONETTA Cash, MD;  Location: [facility] CATH LAB;  Service: Cardiovascular;  Laterality: N/A;   RADIOACTIVE SEED GUIDED PARTIAL MASTECTOMY WITH AXILLARY SENTINEL LYMPH NODE BIOPSY Right 03/21/2015   Procedure: RIGHT  PARTIAL MASTECTOMY WITH RADIOACTIVE SEED LOCALIZATION RIGHT  AXILLARY SENTINEL LYMPH NODE BIOPSY;  Surgeon: Elon Pacini, MD;  Location: [facility]  [place];  Service: General;  Laterality: Right;   REPLACEMENT TOTAL KNEE BILATERAL  2005 &2006   REVERSE SHOULDER ARTHROPLASTY Left 09/27/2023   Procedure: LEFT REVERSE SHOULDER ARTHROPLASTY;  Surgeon: Genelle Standing, MD;  Location: [facility] OR;  Service: Orthopedics;  Laterality: Left;   VASCULAR SURGERY Right 03/15/2013   Ultrasound guided sclerotherapy   Family History  Problem Relation Age of Onset   Diabetes Mother    Stroke Mother    Heart disease Father    Anemia Father    Esophageal cancer Sister    Cancer Sister 58       nose cancer    Diabetes Sister        2 sisters type 1 diabetes    Kidney disease Sister        waiting for a kidney transplant- on dialysis   Diabetes Brother        Type 2 diabetic    Diabetes Son        Type 2    Hypertension Son    Cancer Cousin 30       ovarian cancer    Cancer Cousin 74       ovarian cancer    Colon cancer Neg Hx    Rectal cancer Neg Hx    Stomach cancer Neg Hx    Social History   Socioeconomic History   Marital status: Widowed    Spouse name: Toribio   Number of children: 3   Years of education: 14    Highest education level: Associate degree: occupational, Scientist, product/process development, or vocational program  Occupational History   Occupation: retired-CNA, Equities trader: RETIRED   Occupation: Engineer, site  Tobacco Use   Smoking status: Never   Smokeless tobacco: Never  Vaping Use   Vaping status: Never Used  Substance and Sexual Activity   Alcohol use: No    Alcohol/week: 0.0 standard drinks of alcohol   Drug use: No   Sexual activity: Not Currently    Birth control/protection: Post-menopausal  Other Topics Concern   Not on file  Social History Narrative   Emergency Contact: son, Shuntell Foody [phone]   Patient lives is single story home with daughter, Marelyn and grandddaughter, Coleen. Ramp to front door, 3 steps in back. Home has smoke detectors, no throw rugs. No grab bars in bathroom. No pets.   Diet: Pt has a varied diet.  Reports eating 2 meals a day.    Exercise: walks daily 15-20 mins; not as much due to possible irritable bowel; does exercise at church once weekly. Serves on [employer] at [employer] and as a Chief Strategy Officer at [employer].   Seatbelts: Pt reports wearing seatbelt when in vehicles. Does not drive.   Hobbies: word searches, church, time with family and friends, cooking, walking when she can; going out shopping.   Drinks occasional soda; drinks a lot of water and some kool-aid; does drink tea      *Update as of 04/19/2019*   Patient has not able to walk around her neighborhood, or down her street, due to COVID. Patient is eager to get back to this once she feels safe to leave her house.    Social Drivers of Corporate investment banker Strain: Low Risk  (09/03/2024)   Overall Financial Resource Strain (CARDIA)    Difficulty of Paying Living Expenses: Not hard at all  Food Insecurity: No Food Insecurity (09/03/2024)   Hunger Vital Sign    Worried About Running  Out of Food in the Last Year: Never true    Ran Out of Food in the Last Year: Never true   Transportation Needs: No Transportation Needs (09/03/2024)   PRAPARE - Administrator, Civil Service (Medical): No    Lack of Transportation (Non-Medical): No  Physical Activity: Insufficiently Active (09/03/2024)   Exercise Vital Sign    Days of Exercise per Week: 3 days    Minutes of Exercise per Session: 20 min  Stress: No Stress Concern Present (09/03/2024)   [employer] of Occupational Health - Occupational Stress Questionnaire    Feeling of Stress: Not at all  Social Connections: Moderately Integrated (09/03/2024)   Social Connection and Isolation Panel    Frequency of Communication with Friends and Family: More than three times a week    Frequency of Social Gatherings with Friends and Family: Three times a week    Attends Religious Services: More than 4 times per year    Active Member of Clubs or Organizations: Yes    Attends Banker Meetings: More than 4 times per year    Marital Status: Widowed    Tobacco Counseling Counseling given: Not Answered    Clinical Intake:  Pre-visit preparation completed: Yes  Pain : No/denies pain  Diabetes: Yes CBG done?: No Did pt. bring in CBG monitor from home?: No  Lab Results  Component Value Date   HGBA1C 7.2 (A) 06/11/2024   HGBA1C 7.5 (A) 03/01/2024   HGBA1C 6.4 11/15/2023     How often do you need to have someone help you when you read instructions, pamphlets, or other written materials from your doctor or pharmacy?: 1 - Never  Interpreter Needed?: No  Information entered by :: Charmaine Bloodgood LPN   Activities of Daily Living     09/03/2024    1:15 PM 09/27/2023   12:40 PM  In your present state of health, do you have any difficulty performing the following activities:  Hearing? 0   Vision? 0   Difficulty concentrating or making decisions? 0   Walking or climbing stairs? 1   Dressing or bathing? 0   Doing errands, shopping? 0 0  Preparing Food and eating ? N   Using the Toilet? N    In the past six months, have you accidently leaked urine? N   Do you have problems with loss of bowel control? N   Managing your Medications? N   Managing your Finances? N   Housekeeping or managing your Housekeeping? N     Patient Care Team: Nicholas Bar, MD as PCP - General (Family Medicine) Jacques Sharper, MD (Ophthalmology) Dr. Twana Cary (Dental General Practice) Gail Favorite, MD as Consulting Physician (General Surgery) Letha Truman ORN, NP (Inactive) as Nurse Practitioner (Nurse Practitioner) Lanny Callander, MD as Consulting Physician (Hematology) Loreda Hacker, DPM (Podiatry) Jeanelle Layman CROME, MD (Family Medicine) Pa, [facility] May, Deanna J, NP as Nurse Practitioner (Gastroenterology)  I have updated your Care Teams any recent Medical Services you may have received from other providers in the past year.     Assessment:   This is a routine wellness examination for Nakea.  Hearing/Vision screen Hearing Screening - Comments:: Patient is able to hear conversational tones without difficulty. No issues reported.   Vision Screening - Comments::  up to date with routine eye exams with Dr. Alisa Gola    Goals Addressed             This Visit's Progress  Remain active and independent   On track      Depression Screen     09/03/2024    1:14 PM 06/11/2024    9:30 AM 03/01/2024    8:38 AM 01/31/2024    9:14 AM 11/15/2023    9:22 AM 08/17/2023    9:29 AM 07/09/2023    5:35 PM  PHQ 2/9 Scores  PHQ - 2 Score 0 0 0 0 2 3 1   PHQ- 9 Score  0 0 4 8 8 8     Fall Risk     09/03/2024    1:13 PM 06/11/2024    9:30 AM 01/31/2024    9:12 AM 11/15/2023    9:22 AM 08/17/2023    9:29 AM  Fall Risk   Falls in the past year? 0 0 0 0 0  Number falls in past yr: 0  0 0 0  Injury with Fall? 0  0 0   Risk for fall due to : Impaired mobility      Follow up Falls prevention discussed;Education provided;Falls evaluation completed        MEDICARE RISK AT HOME:   Medicare Risk at Home Any stairs in or around the home?: No If so, are there any without handrails?: No Home free of loose throw rugs in walkways, pet beds, electrical cords, etc?: Yes Adequate lighting in your home to reduce risk of falls?: Yes Life alert?: No Use of a cane, walker or w/c?: Yes Grab bars in the bathroom?: Yes Shower chair or bench in shower?: No Elevated toilet seat or a handicapped toilet?: Yes  TIMED UP AND GO:  Was the test performed?  No  Cognitive Function: 6CIT completed    08/29/2018   10:25 AM 05/21/2015   11:26 AM 01/18/2014   10:00 AM 04/26/2013   11:00 AM 04/03/2012   12:00 PM  MMSE - Mini Mental State Exam  Orientation to time 5 5  5  5  5    Orientation to Place 5 5  5  5  5    Registration 3 3  3  3  3    Attention/ Calculation 5 5  5  5  5    Recall 3 3  3  1  3    Language- name 2 objects 2 2  2  2  2    Language- repeat 1 1 1 1 1   Language- follow 3 step command 3 3  3  3  3    Language- read & follow direction 1 1  1  1  1    Write a sentence 1 1  1  1  1    Copy design 1 1  1  1  1    Total score 30 30  30  28  30       Data saved with a previous flowsheet row definition        09/03/2024    1:15 PM 07/09/2023    5:37 PM 04/19/2019    4:06 PM 04/19/2019    3:29 PM 04/19/2019    2:49 PM  6CIT Screen  What Year? 0 points 0 points 0 points 0 points 0 points  What month? 0 points 0 points 0 points  0 points  What time? 0 points 0 points 0 points 0 points 0 points  Count back from 20 0 points 0 points 0 points 0 points 0 points  Months in reverse 0 points 0 points 0 points  0 points  Repeat phrase 0 points 0 points  0 points 0 points 0 points  Total Score 0 points 0 points 0 points  0 points    Immunizations Immunization History  Administered Date(s) Administered   Fluad Quad(high Dose 65+) 09/06/2022   Fluad Trivalent(High Dose 65+) 08/17/2023   Influenza Split 08/17/2011, 08/23/2012   Influenza Whole 09/12/2009, 12/14/2010    Influenza,inj,Quad PF,6+ Mos 08/27/2013, 09/02/2014, 08/13/2015, 09/08/2016, 10/05/2017, 08/29/2018, 08/01/2019, 08/05/2020, 08/12/2021   PFIZER(Purple Top)SARS-COV-2 Vaccination 01/25/2020, 02/19/2020, 09/21/2020   Pfizer Covid-19 Vaccine Bivalent Booster 91yrs & up 09/09/2021   Pfizer(Comirnaty)Fall Seasonal Vaccine 12 years and older 10/01/2022, 08/17/2023, 03/01/2024   Pneumococcal Conjugate-13 05/21/2015   Pneumococcal Polysaccharide-23 04/30/2012, 10/05/2017   Td 07/14/2009   Tdap 09/23/2018   Zoster Recombinant(Shingrix) 11/29/2022, 01/28/2023   Zoster, Live 06/02/2012    Screening Tests Health Maintenance  Topic Date Due   Influenza Vaccine  06/29/2024   COVID-19 Vaccine (8 - 2025-26 season) 07/30/2024   Colonoscopy  11/03/2024   HEMOGLOBIN A1C  12/12/2024   OPHTHALMOLOGY EXAM  12/26/2024   Diabetic kidney evaluation - eGFR measurement  01/29/2025   FOOT EXAM  01/30/2025   Mammogram  02/13/2025   Diabetic kidney evaluation - Urine ACR  06/11/2025   Medicare Annual Wellness (AWV)  09/03/2025   Pneumococcal Vaccine: 50+ Years  Completed   DEXA SCAN  Completed   Hepatitis C Screening  Completed   Zoster Vaccines- Shingrix  Completed   Meningococcal B Vaccine  Aged Out   DTaP/Tdap/Td  Discontinued    Health Maintenance Items Addressed: Vaccines Due: Flu   Additional Screening:  Vision Screening: Recommended annual ophthalmology exams for early detection of glaucoma and other disorders of the eye. Is the patient up to date with their annual eye exam?  Yes  Who is the provider or what is the name of the office in which the patient attends annual eye exams? [facility]   Dental Screening: Recommended annual dental exams for proper oral hygiene  Community Resource Referral / Chronic Care Management: CRR required this visit?  No   CCM required this visit?  No   Plan:    I have personally reviewed and noted the following in the patient's chart:   Medical and social  history Use of alcohol, tobacco or illicit drugs  Current medications and supplements including opioid prescriptions. Patient is not currently taking opioid prescriptions. Functional ability and status Nutritional status Physical activity Advanced directives List of other physicians Hospitalizations, surgeries, and ER visits in previous 12 months Vitals Screenings to include cognitive, depression, and falls Referrals and appointments  In addition, I have reviewed and discussed with patient certain preventive protocols, quality metrics, and best practice recommendations. A written personalized care plan for preventive services as well as general preventive health recommendations were provided to patient.   Lavelle Pfeiffer [place], [place]   [id number][birth year]01[id number]   After Visit Summary: (MyChart) Due to this being a telephonic visit, the after visit summary with patients personalized plan was offered to patient via MyChart   Notes: Nothing significant to report at this time.

## 2024-09-06 ENCOUNTER — Ambulatory Visit: Admitting: Podiatry

## 2024-09-06 ENCOUNTER — Encounter: Payer: Self-pay | Admitting: Podiatry

## 2024-09-06 DIAGNOSIS — B351 Tinea unguium: Secondary | ICD-10-CM

## 2024-09-06 DIAGNOSIS — E1140 Type 2 diabetes mellitus with diabetic neuropathy, unspecified: Secondary | ICD-10-CM

## 2024-09-06 NOTE — Progress Notes (Signed)
This patient returns to my office for at risk foot care.  This patient requires this care by a professional since this patient will be at risk due to having diabetes.   This patient is unable to cut nails herself since the patient cannot reach her nails.These nails are painful walking and wearing shoes.  This patient presents for at risk foot care today.  General Appearance  Alert, conversant and in no acute stress.  Vascular  Dorsalis pedis and posterior tibial  pulses are palpable  bilaterally.  Capillary return is within normal limits  bilaterally. Temperature is within normal limits  bilaterally.  Neurologic  Senn-Weinstein monofilament wire test absent   bilaterally. Muscle power within normal limits bilaterally.  Nails Thick disfigured discolored nails with subungual debris  from hallux to fifth toes bilaterally. No evidence of bacterial infection or drainage bilaterally.  Orthopedic  No limitations of motion  feet .  No crepitus or effusions noted.  No bony pathology or digital deformities noted. Asymptomatic  HAV  B/L.  Pes planus  B/L. Hammer toes second left foot. Contracted digits  B/L.  Skin  normotropic skin with no porokeratosis noted bilaterally.  No signs of infections or ulcers noted.     Onychomycosis  Pain in right toes  Pain in left toes    Consent was obtained for treatment procedures.   Mechanical debridement of nails 1-5  bilaterally performed with a nail nipper.  Filed with dremel without incident.    Return office visit  3 months                    Told patient to return for periodic foot care and evaluation due to potential at risk complications   Gardiner Barefoot DPM

## 2024-09-07 DIAGNOSIS — N3281 Overactive bladder: Secondary | ICD-10-CM | POA: Diagnosis not present

## 2024-09-07 DIAGNOSIS — R350 Frequency of micturition: Secondary | ICD-10-CM | POA: Diagnosis not present

## 2024-09-13 ENCOUNTER — Encounter: Payer: Self-pay | Admitting: Family Medicine

## 2024-09-13 ENCOUNTER — Ambulatory Visit (INDEPENDENT_AMBULATORY_CARE_PROVIDER_SITE_OTHER): Admitting: Family Medicine

## 2024-09-13 VITALS — BP 148/92 | HR 65 | Ht 62.0 in | Wt 210.0 lb

## 2024-09-13 DIAGNOSIS — E1169 Type 2 diabetes mellitus with other specified complication: Secondary | ICD-10-CM | POA: Diagnosis not present

## 2024-09-13 DIAGNOSIS — M21969 Unspecified acquired deformity of unspecified lower leg: Secondary | ICD-10-CM

## 2024-09-13 DIAGNOSIS — I10 Essential (primary) hypertension: Secondary | ICD-10-CM

## 2024-09-13 DIAGNOSIS — Z23 Encounter for immunization: Secondary | ICD-10-CM | POA: Diagnosis not present

## 2024-09-13 LAB — POCT GLYCOSYLATED HEMOGLOBIN (HGB A1C): HbA1c, POC (controlled diabetic range): 6.5 % (ref 0.0–7.0)

## 2024-09-13 NOTE — Progress Notes (Signed)
   SUBJECTIVE:   CHIEF COMPLAINT / HPI:  Discussed the use of AI scribe software for clinical note transcription with the patient, who gave verbal consent to proceed.  History of Present Illness [name] is a 73 year old female with diabetes and hypertension who presents for follow-up.  Glycemic control - Diabetes managed with 30 units of Lantus  in the morning and Ozempic  - No episodes of hypoglycemia - Lowest blood glucose reading of 127 mg/dL - Also taking Jardiance    Hypertension  - Hypertension well-controlled at home - No shortness of breath except when bending over frequently and exercising  - Leg swelling present similar to chronic leg swelling. Continuing to use compression socks. R always more swollen than left.      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  - Continue ozempic  2 mg, lantus  30 u, jardiance  25 mg  - Follow up in 1 month, watch for any hypoglycemic episodes as A1c improves, can reduce lantus  as needed.  Essential hypertension Blood pressure is well-controlled at home with arm cuff. Office reading may be elevated due to white coat hypertension. Would like to avoid adding a diuretic given her OAB; however, based on previous BMP If needed could add hydrochlorothiazide or increase spironolactone .  - Continue spironolactone  25mg  , losartan  100 mg, metoprolol  12.5 mg  - Amb blood pressure monitor appt with Dr. Koval (pharmacy referral placed)  Encounter for immunization COVID and  flu vaccines today    Areta Saliva, MD [facility]

## 2024-09-13 NOTE — Assessment & Plan Note (Signed)
 Improved glycemic control with A1c 6.5 from 7.2.  - Continue ozempic  2 mg, lantus  30 u, jardiance  25 mg  - Follow up in 1 month, watch for any hypoglycemic episodes as A1c improves, can reduce lantus  as needed.

## 2024-09-13 NOTE — Patient Instructions (Addendum)
  VISIT SUMMARY: Today, you came in for a follow-up visit to discuss your diabetes, hypertension, urinary symptoms, constipation, and leg swelling. We reviewed your current medications and made some adjustments to your treatment plan.  YOUR PLAN: -TYPE 2 DIABETES MELLITUS WITH OTHER SPECIFIED COMPLICATION: Your diabetes is well-controlled with your current medications, Lantus  and Ozempic . You have not experienced any low blood sugar episodes. Please continue taking Lantus  30 units once daily and Ozempic  as prescribed. A1c of 6.5 today. Since you are not having any low sugar (hypoglycemic episodes) and you are doing well we can continue you at this dose.   -ESSENTIAL HYPERTENSION: Your blood pressure is well-controlled at home, although it may be slightly elevated today. I would like to not put you on more medication if we do not have to. I have made you an appointment with Dr. Koval to do an ambulatory blood pressure monitor. If this is normal we can keep you on the same medications.   Follow up in 1-2 months

## 2024-09-13 NOTE — Assessment & Plan Note (Addendum)
 Blood pressure is well-controlled at home with arm cuff. Office reading may be elevated due to white coat hypertension. Would like to avoid adding a diuretic given her OAB; however, based on previous BMP If needed could add hydrochlorothiazide or increase spironolactone .  - Continue spironolactone  25mg  , losartan  100 mg, metoprolol  12.5 mg  - Amb blood pressure monitor appt with Dr. Koval (pharmacy referral placed)

## 2024-09-25 ENCOUNTER — Ambulatory Visit (INDEPENDENT_AMBULATORY_CARE_PROVIDER_SITE_OTHER): Admitting: Pharmacist

## 2024-09-25 ENCOUNTER — Encounter: Payer: Self-pay | Admitting: Pharmacist

## 2024-09-25 VITALS — BP 181/105 | Wt 208.8 lb

## 2024-09-25 DIAGNOSIS — M1000 Idiopathic gout, unspecified site: Secondary | ICD-10-CM

## 2024-09-25 DIAGNOSIS — I10 Essential (primary) hypertension: Secondary | ICD-10-CM

## 2024-09-25 NOTE — Assessment & Plan Note (Signed)
 History of gout with no recent exacerbations reported by patient. Last uric acid was 6.5 on 12/07/22. Repeat uric acid today with BMET and UACR.

## 2024-09-25 NOTE — Patient Instructions (Signed)
 Blood Pressure Activity Diary Time Lying down/ Sleeping Walking/ Exercise Stressed/ Angry Headache/ Pain Dizzy  9 AM       10 AM       11 AM       12 PM       1 PM       2 PM       Time Lying down/ Sleeping Walking/ Exercise Stressed/ Angry Headache/ Pain Dizzy  3 PM       4 PM        5 PM       6 PM       7 PM       8 PM       Time Lying down/ Sleeping Walking/ Exercise Stressed/ Angry Headache/ Pain Dizzy  9 PM       10 PM       11 PM       12 AM       1 AM       2 AM       3 AM       Time Lying down/ Sleeping Walking/ Exercise Stressed/ Angry Headache/ Pain Dizzy  4 AM       5 AM       6 AM       7 AM       8 AM       9 AM       10 AM        Time you woke up: _________                  Time you went to sleep:__________  Come back tomorrow at 11am to have the monitor removed  Call the [facility] if you have any questions before then ([phone])  Wearing the Blood Pressure Monitor The cuff will inflate every 20 minutes during the day and every 30 minutes while you sleep. Fill out the blood pressure-activity diary during the day, especially during activities that may affect your reading -- such as exercise, stress, walking, taking your blood pressure medications  Important things to know: Avoid taking the monitor off for the next 24 hours, unless it causes you discomfort or pain. Do NOT get the monitor wet and do NOT try to clean the monitor with any cleaning products. Do NOT put the monitor on anyone else's arm. When the cuff inflates, avoid excess movement. Let the cuffed arm hang loosely, slightly away from the body. Avoid flexing the muscles or moving the hand/fingers. Remember to fill out the blood pressure activity diary. If you experience severe pain or unusual pain (not associated with getting your blood pressure checked), remove the monitor.  Troubleshooting:  Code  Troubleshooting   1  Check cuff position, tighten cuff   2, 3  Remain still  during reading   4, 87  Check air hose connections and make sure cuff is tight   85, 89  Check hose connections and make tubing is not crimped   86  Push START/STOP to restart reading   88, 91  Retry by pushing START/STOP   90  Replace batteries. If problem persists, remove monitor and bring back to   clinic at follow up   97, 98, 99  Service required - Remove monitor and bring back to clinic at follow up

## 2024-09-25 NOTE — Progress Notes (Signed)
 S:     Chief Complaint  Patient presents with   Medication Management    Amb Blood Pressure Day 1   73 y.o. female who presents for hypertension evaluation, education, and management. Patient arrives in  good spirits and presents with walker.   Patient was referred and last seen by Primary Care Provider, Dr. Nicholas, on 09/13/2024.  At last visit, patient was referred for ambulatory .   PMH is significant for T2DM, hypertension, hyperlipidemia, gout, breast cancer.   Diagnosed with Hypertension in the year of 2021.    Medication compliance is reported to be good.  Discussed procedure for wearing the monitor and gave patient written instructions. Monitor was placed on non-dominant arm with instructions to return in the morning.   Current BP Medications include:  Metoprolol  succinate 12.5mg  daily  O:  Review of Systems  All other systems reviewed and are negative.  Physical Exam Constitutional:      Appearance: Normal appearance.  Neurological:     Mental Status: She is alert.  Psychiatric:        Mood and Affect: Mood normal.        Behavior: Behavior normal.        Thought Content: Thought content normal.        Judgment: Judgment normal.    Last 3 Office BP readings: BP Readings from Last 3 Encounters:  09/25/24 (!) 181/105  09/13/24 (!) 148/92  08/17/24 136/86    Clinical Atherosclerotic Cardiovascular Disease (ASCVD): No  The 10-year ASCVD risk score (Arnett DK, et al., 2019) is: 36.1%   Values used to calculate the score:     Age: 89 years     Clincally relevant sex: Female     Is Non-Hispanic African American: Yes     Diabetic: Yes     Tobacco smoker: No     Systolic Blood Pressure: 181 mmHg     Is BP treated: Yes     HDL Cholesterol: 45 mg/dL     Total Cholesterol: 141 mg/dL  Basic Metabolic Panel    Component Value Date/Time   NA 139 01/30/2024 1022   NA 139 12/07/2022 0911   NA 141 08/03/2017 0936   K 4.4 01/30/2024 1022   K 4.9 08/03/2017  0936   CL 103 01/30/2024 1022   CO2 30 01/30/2024 1022   CO2 23 08/03/2017 0936   GLUCOSE 150 (H) 01/30/2024 1022   GLUCOSE 219 (H) 08/03/2017 0936   BUN 26 (H) 01/30/2024 1022   BUN 26 12/07/2022 0911   BUN 40.1 (H) 08/03/2017 0936   CREATININE 1.28 (H) 01/30/2024 1022   CREATININE 1.5 (H) 08/03/2017 0936   CALCIUM  9.5 01/30/2024 1022   CALCIUM  9.6 08/03/2017 0936   GFRNONAA 44 (L) 01/30/2024 1022   GFRAA 55 (L) 08/05/2020 1008   GFRAA 42 (L) 12/31/2019 0922   ABPM Study Data: Arm Placement left arm For Office Goal BP of <130/80 mmHg:  ABPM thresholds: Overall BP <125/75 mmHg, daytime BP <130/80 mmHg, sleeptime BP <110/65 mmHg   A/P: History of hypertension longstanding currently taking metoprolol  with goal presssure of <130/80 mm Hg.     -Placed blood pressure cuff, provided education, patient instructed to wear cuff for 24 hours and return tomorrow to review results. -Labs ordered: UACR and BMET Written patient instructions provided including activity/symptom/event log. Patient verbalized understanding of plan. Total time in face to face counseling 18 minutes.    History of gout with no recent exacerbations reported by patient.  Last uric acid was 6.5 on 12/07/22. Repeat uric acid today with BMET and UACR.   Follow-up: Tomorrow AM - early morning appointment 11am  Patient seen with Lawson Mao, PharmD Candidate - PY3 student and Belvie Macintosh, PharmD - PY4 Candidate.

## 2024-09-25 NOTE — Assessment & Plan Note (Signed)
 History of hypertension longstanding currently taking metoprolol  with goal presssure of <130/80 mm Hg.     -Placed blood pressure cuff, provided education, patient instructed to wear cuff for 24 hours and return tomorrow to review results. -Labs ordered: UACR and BMET Written patient instructions provided including activity/symptom/event log. Patient verbalized understanding of plan. Total time in face to face counseling 18 minutes.

## 2024-09-26 ENCOUNTER — Ambulatory Visit: Admitting: Pharmacist

## 2024-09-26 ENCOUNTER — Encounter: Payer: Self-pay | Admitting: Pharmacist

## 2024-09-26 VITALS — BP 149/87 | HR 76

## 2024-09-26 DIAGNOSIS — I10 Essential (primary) hypertension: Secondary | ICD-10-CM

## 2024-09-26 LAB — BASIC METABOLIC PANEL WITH GFR
BUN/Creatinine Ratio: 24 (ref 12–28)
BUN: 32 mg/dL — ABNORMAL HIGH (ref 8–27)
CO2: 22 mmol/L (ref 20–29)
Calcium: 9.2 mg/dL (ref 8.7–10.3)
Chloride: 103 mmol/L (ref 96–106)
Creatinine, Ser: 1.33 mg/dL — ABNORMAL HIGH (ref 0.57–1.00)
Glucose: 101 mg/dL — ABNORMAL HIGH (ref 70–99)
Potassium: 5 mmol/L (ref 3.5–5.2)
Sodium: 141 mmol/L (ref 134–144)
eGFR: 42 mL/min/1.73 — ABNORMAL LOW (ref >59)

## 2024-09-26 LAB — MICROALBUMIN / CREATININE URINE RATIO
Creatinine, Urine: 52.6 mg/dL
Microalb/Creat Ratio: 571 mg/g{creat} — ABNORMAL HIGH (ref 0–29)
Microalbumin, Urine: 300.4 ug/mL

## 2024-09-26 LAB — URIC ACID: Uric Acid: 5.7 mg/dL (ref 3.1–7.9)

## 2024-09-26 MED ORDER — AMLODIPINE BESYLATE 5 MG PO TABS: 5.0000 mg | ORAL_TABLET | Freq: Every day | ORAL | 11 refills | Status: DC

## 2024-09-26 NOTE — Progress Notes (Signed)
 S:     Chief Complaint  Patient presents with   Medication Management    Amb Blood Pressure Day #2   73 y.o. female who presents for hypertension evaluation, education, and management.  Patient arrives in  good spirits and presents with assistance from walker.   Patient returns to clinic with 24 hour blood pressure monitor and reports they were able to wear the ambulatory blood pressure cuff for the entire 24 evaluation period.   O:  Review of Systems  All other systems reviewed and are negative.   Physical Exam Constitutional:      Appearance: Normal appearance. She is normal weight.  Pulmonary:     Effort: Pulmonary effort is normal.  Neurological:     Mental Status: She is alert.  Psychiatric:        Mood and Affect: Mood normal.        Behavior: Behavior normal.        Thought Content: Thought content normal.        Judgment: Judgment normal.     Last 3 Office BP readings: BP Readings from Last 3 Encounters:  09/26/24 (!) 149/87  09/25/24 (!) 181/105  09/13/24 (!) 148/92    Clinical Atherosclerotic Cardiovascular Disease (ASCVD): No  The 10-year ASCVD risk score (Arnett DK, et al., 2019) is: 28.3%   Values used to calculate the score:     Age: 36 years     Clincally relevant sex: Female     Is Non-Hispanic African American: Yes     Diabetic: Yes     Tobacco smoker: No     Systolic Blood Pressure: 149 mmHg     Is BP treated: Yes     HDL Cholesterol: 45 mg/dL     Total Cholesterol: 141 mg/dL  Basic Metabolic Panel    Component Value Date/Time   NA 141 09/25/2024 1141   NA 141 08/03/2017 0936   K 5.0 09/25/2024 1141   K 4.9 08/03/2017 0936   CL 103 09/25/2024 1141   CO2 22 09/25/2024 1141   CO2 23 08/03/2017 0936   GLUCOSE 101 (H) 09/25/2024 1141   GLUCOSE 150 (H) 01/30/2024 1022   GLUCOSE 219 (H) 08/03/2017 0936   BUN 32 (H) 09/25/2024 1141   BUN 40.1 (H) 08/03/2017 0936   CREATININE 1.33 (H) 09/25/2024 1141   CREATININE 1.28 (H) 01/30/2024  1022   CREATININE 1.5 (H) 08/03/2017 0936   CALCIUM  9.2 09/25/2024 1141   CALCIUM  9.6 08/03/2017 0936   GFRNONAA 44 (L) 01/30/2024 1022   GFRAA 55 (L) 08/05/2020 1008   GFRAA 42 (L) 12/31/2019 9077    Renal function: Estimated Creatinine Clearance: 39.2 mL/min (A) (by C-G formula based on SCr of 1.33 mg/dL (H)).   ABPM Study Data: Arm Placement left arm  Overall Mean 24hr BP:   146/84 mmHg  HR: 76  Daytime Mean BP:  149/87 mmHg  HR: 76  Nighttime Mean BP:  130/73 mmHg  HR: 76  Dipping Pattern: Yes.    Sys:   13%   Dia: 15%  [normal dipping ~10-20%]    For Office Goal BP of <130/80 mmHg: if tolerated ABPM thresholds: Overall BP <125/75 mmHg, daytime BP <130/80 mmHg, sleeptime BP <110/65 mmHg    A/P: History of hypertension longstanding currently taking metoprolol , spironolactone  25mg  and losartan  100mg  daily with goal presssure of <130/80 mm Hg. Found have persistently elevated and uncontrolled blood pressure with 24-hour ambulatory blood pressure evaluation which demonstrates an average AWAKE blood  pressure of 149/87 mmHg.  Nocturnal dipping pattern is normal.   Changes to medications - Continued losartan  100 mg daily, spironolactone  25mg  daily and metoprolol  succinate 12.5mg  daily - Started amlodipine 5 mg daily.  Patient educated on purpose, proper use and potential adverse effects of lower extremity swelling. Following instruction patient verbalized understanding of treatment plan.  - BMET stable, UACR pending.     History of gout with no recent exacerbations reported by patient. Uric acid value of 5.7 and at  goal.  -Continue current uric acid modulating therapy.   Breo (vilanterol / fluticasone ) inhaler use reviewed. Patient was able to demonstrate appropriate technique while administering dose in office. Verbalized understanding of need to rinse mouth following each use.   Written patient instructions provided. Patient verbalized understanding of treatment plan.  Total  time in face to face counseling 22 minutes.    Follow-up:  Pharmacist - none planned PCP clinic visit in 10/16/2024  Patient seen with Lawson Mao, PharmD Candidate - PY3 student

## 2024-09-26 NOTE — Progress Notes (Signed)
 Reviewed and agree with Dr Rennis plan.

## 2024-09-26 NOTE — Patient Instructions (Signed)
 It was nice to see you today!  Thank you for completing the blood pressure monitoring evaluation.  Your goal blood pressure is < 130/80 mmHg   Medication Changes: START Amlodipine 5mg  once daily  Continue all other medication the same.   Monitor blood pressure at home and keep a log (on a piece of paper) to bring with you to your next visit.

## 2024-09-27 ENCOUNTER — Telehealth: Payer: Self-pay | Admitting: Pharmacist

## 2024-09-27 NOTE — Telephone Encounter (Signed)
 Attempted to contact patient for follow-up of UACR result from 10/28  Left HIPAA compliant voice mail requesting call back to direct phone: [phone]      Total time with patient call and documentation of interaction: 7 minutes.

## 2024-09-28 ENCOUNTER — Telehealth: Payer: Self-pay | Admitting: Pharmacist

## 2024-09-28 NOTE — Telephone Encounter (Signed)
 Patient contacted for follow-up discussion of lab results from 10/28 - UACR No answer, left message requesting call back.   Patient returns call.  Discussed her previous UACR values from 1 year ago and from 3 months ago, both were ~ 190 Also shared that most recent UACR was 571   Discussed goal for therapy to avoid protein in urine or progression would include diabetes control, currently doing well with A1C of 6.5 and target therapy to reduce progression. She currently takes ARB - losartan  100mg , Empagliflozin  25mg  and MRA - spironolactone  at 25mg .   I shared that her remaining target is to manage her blood pressure to improve control.  Recently we added amlodipine and have follow-up for hypertension planned.    Total time with patient call and documentation of interaction: 12  minutes.

## 2024-09-28 NOTE — Telephone Encounter (Signed)
-----   Message from Maude Lagos sent at 09/27/2024  3:51 PM EDT ----- UACR call

## 2024-09-28 NOTE — Telephone Encounter (Signed)
 Reviewed and agree with Dr Rennis plan.

## 2024-09-28 NOTE — Telephone Encounter (Signed)
 Patient returns call to nurse line.   Pharmacy number given to patient for a direct call back to Dr. Koval.

## 2024-10-01 ENCOUNTER — Encounter: Payer: Self-pay | Admitting: Radiology

## 2024-10-16 ENCOUNTER — Encounter: Payer: Self-pay | Admitting: Family Medicine

## 2024-10-16 ENCOUNTER — Ambulatory Visit: Admitting: Family Medicine

## 2024-10-16 VITALS — BP 128/81 | HR 88 | Wt 208.6 lb

## 2024-10-16 DIAGNOSIS — I10 Essential (primary) hypertension: Secondary | ICD-10-CM | POA: Diagnosis not present

## 2024-10-16 NOTE — Progress Notes (Signed)
   SUBJECTIVE:   CHIEF COMPLAINT / HPI:  Discussed the use of AI scribe software for clinical note transcription with the patient, who gave verbal consent to proceed.  History of Present Illness [name] is a 73 year old female with hypertension who presents for a blood pressure follow-up.  Hypertension - Systolic blood pressure readings are mostly in the 130s. - Diastolic blood pressure readings are in the 70s or just above 80. - No dizziness with current antihypertensive regimen. - No new or worsening lower extremity swelling; legs remain unchanged.  Chronic Gait instability - Intermittent sensation of being off balance, present both at home and when out. - Sensation of imbalance began prior to initiation of current antihypertensive medication. - Consistently uses a walker when going out. - Occasionally uses a walker or cane at home.    PERTINENT  PMH / PSH: HTN, T2DM, CHF, Asthma, CKD   OBJECTIVE:  BP 136/74   Pulse 88   Wt 208 lb 9.6 oz (94.6 kg)   SpO2 96%   BMI 38.15 kg/m   General: well appearing, frail, in no acute distress CV: RRR, radial pulses equal and palpable, mild non pitting BLE edema  Resp: Normal work of breathing on room air, CTAB Abd: Soft, non tender, non distended  Neuro: Alert & Oriented x 4, slightly off balance when standing without her walker   ASSESSMENT/PLAN:   Assessment & Plan Essential hypertension Blood pressure mostly controlled with current regimen. Given frailty would not want to over treat and cause falls. Standing BP is well controlled.  - losartan  100 mg, spironolactone  25 mg, metoprolol  25 mg, amlodipine 5 mg - Fu in 2 months       Areta Saliva, MD [facility]

## 2024-10-16 NOTE — Assessment & Plan Note (Signed)
 Blood pressure mostly controlled with current regimen. Given frailty would not want to over treat and cause falls. Standing BP is well controlled.  - losartan  100 mg, spironolactone  25 mg, metoprolol  25 mg, amlodipine 5 mg - Fu in 2 months

## 2024-10-16 NOTE — Patient Instructions (Addendum)
 It was wonderful to see you today.  Please bring ALL of your medications with you to every visit.   Today we talked about:  Hypertension - Your blood pressure is controlled standing. We will keep you on the current medication regimen. Keep checking your blood pressures at home.   Please follow up in 2 month   Thank you for choosing [facility].   Please call [phone] with any questions about today's appointment.  Please be sure to schedule follow up at the front desk before you leave today.   Areta Saliva, MD  Family Medicine

## 2024-10-22 ENCOUNTER — Other Ambulatory Visit: Payer: Self-pay | Admitting: Family Medicine

## 2024-11-08 ENCOUNTER — Encounter

## 2024-12-06 ENCOUNTER — Other Ambulatory Visit: Payer: Self-pay

## 2024-12-06 ENCOUNTER — Telehealth: Payer: Self-pay

## 2024-12-06 DIAGNOSIS — E785 Hyperlipidemia, unspecified: Secondary | ICD-10-CM

## 2024-12-06 MED ORDER — ATORVASTATIN CALCIUM 40 MG PO TABS: 40.0000 mg | ORAL_TABLET | Freq: Every day | ORAL | 2 refills | Status: AC

## 2024-12-06 NOTE — Telephone Encounter (Signed)
 Patient calls nurse line reporting a fall.   She reports she fell in her kitchen on Sunday. She reports she feel forward and hit her knees and banged her head.   She denies any LOC at the time. She reports her knees are sore and she has a bump on her head. She reports she did not draw blood.   She denies any excessive sleepiness, vision changes or dizziness.   She reports she has been ambulating as normal.   Patient already has an apt with PCP on 1/13.  Precautions discussed with patient for sooner evaluation, otherwise she reports she will see PCP next week.   Will forward to PCP to make aware.

## 2024-12-07 ENCOUNTER — Encounter: Payer: Self-pay | Admitting: Podiatry

## 2024-12-07 ENCOUNTER — Ambulatory Visit: Admitting: Podiatry

## 2024-12-07 DIAGNOSIS — M79675 Pain in left toe(s): Secondary | ICD-10-CM | POA: Diagnosis not present

## 2024-12-07 DIAGNOSIS — B351 Tinea unguium: Secondary | ICD-10-CM

## 2024-12-07 DIAGNOSIS — E1140 Type 2 diabetes mellitus with diabetic neuropathy, unspecified: Secondary | ICD-10-CM | POA: Diagnosis not present

## 2024-12-07 DIAGNOSIS — M79674 Pain in right toe(s): Secondary | ICD-10-CM | POA: Diagnosis not present

## 2024-12-07 NOTE — Progress Notes (Signed)
This patient returns to my office for at risk foot care.  This patient requires this care by a professional since this patient will be at risk due to having diabetes.   This patient is unable to cut nails herself since the patient cannot reach her nails.These nails are painful walking and wearing shoes.  This patient presents for at risk foot care today.  General Appearance  Alert, conversant and in no acute stress.  Vascular  Dorsalis pedis and posterior tibial  pulses are palpable  bilaterally.  Capillary return is within normal limits  bilaterally. Temperature is within normal limits  bilaterally.  Neurologic  Senn-Weinstein monofilament wire test absent   bilaterally. Muscle power within normal limits bilaterally.  Nails Thick disfigured discolored nails with subungual debris  from hallux to fifth toes bilaterally. No evidence of bacterial infection or drainage bilaterally.  Orthopedic  No limitations of motion  feet .  No crepitus or effusions noted.  No bony pathology or digital deformities noted. Asymptomatic  HAV  B/L.  Pes planus  B/L. Hammer toes second left foot. Contracted digits  B/L.  Skin  normotropic skin with no porokeratosis noted bilaterally.  No signs of infections or ulcers noted.     Onychomycosis  Pain in right toes  Pain in left toes    Consent was obtained for treatment procedures.   Mechanical debridement of nails 1-5  bilaterally performed with a nail nipper.  Filed with dremel without incident.    Return office visit  3 months                    Told patient to return for periodic foot care and evaluation due to potential at risk complications   Gardiner Barefoot DPM

## 2024-12-11 ENCOUNTER — Encounter: Payer: Self-pay | Admitting: Family Medicine

## 2024-12-11 ENCOUNTER — Ambulatory Visit: Payer: Self-pay | Admitting: Family Medicine

## 2024-12-11 VITALS — BP 134/74 | HR 83 | Ht 62.0 in | Wt 209.0 lb

## 2024-12-11 DIAGNOSIS — R519 Headache, unspecified: Secondary | ICD-10-CM

## 2024-12-11 DIAGNOSIS — E118 Type 2 diabetes mellitus with unspecified complications: Secondary | ICD-10-CM | POA: Diagnosis not present

## 2024-12-11 DIAGNOSIS — Z1211 Encounter for screening for malignant neoplasm of colon: Secondary | ICD-10-CM | POA: Diagnosis not present

## 2024-12-11 DIAGNOSIS — I10 Essential (primary) hypertension: Secondary | ICD-10-CM | POA: Diagnosis not present

## 2024-12-11 DIAGNOSIS — E1121 Type 2 diabetes mellitus with diabetic nephropathy: Secondary | ICD-10-CM | POA: Diagnosis not present

## 2024-12-11 DIAGNOSIS — Z23 Encounter for immunization: Secondary | ICD-10-CM

## 2024-12-11 DIAGNOSIS — W19XXXA Unspecified fall, initial encounter: Secondary | ICD-10-CM

## 2024-12-11 LAB — HEMOGLOBIN A1C: Hemoglobin A1C: 7.2

## 2024-12-11 MED ORDER — AMLODIPINE BESYLATE 5 MG PO TABS: 5.0000 mg | ORAL_TABLET | Freq: Every day | ORAL | 11 refills | Status: AC

## 2024-12-11 NOTE — Patient Instructions (Addendum)
 It was wonderful to see you today.  Please bring ALL of your medications with you to every visit.   For your fall I am going to get a CT head to see if you have any brain bleeding given that you still have headaches.  We will need to follow up in 1-2 weeks for this. Please avoid taking any aspirin  or ibuprofen  at this time. I have also referred you to physical therapy to work on balance and strength   For your kidney disease we will get some labs to monitor today. Otherwise you are doing well.   Thank you for choosing [facility].   Please call [phone] with any questions about today's appointment.  Please be sure to schedule follow up at the front desk before you leave today.   Areta Saliva, MD  Family Medicine

## 2024-12-11 NOTE — Progress Notes (Unsigned)
" ° °  SUBJECTIVE:   CHIEF COMPLAINT / HPI:  Discussed the use of AI scribe software for clinical note transcription with the patient, who gave verbal consent to proceed.  History of Present Illness [name] is a 74 year old female who presents with headaches following a fall.  Headache and scalp tenderness - Daily mild headaches since January 4th fall. Not on blood thinners.  - Headaches occur throughout the day - Symptoms improve with rest or sleep - No clear triggers identified  Recent fall and gait instability - Fall occurred at home on January 4th while making a quick turn from bedroom to living room - Not using walker or cane indoors at the time of fall - Known balance problems - Previously completed balance assessment and physical therapy. Has not returned.   Medication management - Current medications: allopurinol , colchicine , losartan  100 mg, gabapentin  100 mg at night, metoprolol  (half tablet as instructed), spironolactone , Jardiance , antibiotic for kidney and tooth infection - Nearly out of amlodipine  and requires refill    PERTINENT  PMH / PSH: H/o NICM, HFpEF, HTN, T2DM, CKD  OBJECTIVE:  BP 134/74   Pulse 83   Ht 5' 2 (1.575 m)   Wt 209 lb (94.8 kg)   SpO2 98%   BMI 38.23 kg/m   General: well appearing, in no acute distress CV: RRR, radial pulses equal and palpable, no BLE edema  Resp: Normal work of breathing on room air, CTAB Abd: Soft, non tender, non distended  Neuro: Alert & Oriented x 4, PERRLA, EOMI, CN 2-12 intact, 5/5 upper and lower extremity   ASSESSMENT/PLAN:   Assessment & Plan Fall, initial encounter New onset of headaches Concern for possible brain bleed given new onset of headaches since fall on Janurary 4th. Reassuringly no neurologic deficits on exam. Mechanical fall given balance issues and physical deconditioning. Has walker and cane but was not using it indoors.  - CT head wo contrast STAT  - Gave patient ED precautions for  worsening headache, change in mentation, or new neurologic deficit  - Tyelnol prn for pain  Essential hypertension Currently controlled with amlodipine , losartan , spironolactone .  - BMP  - Refill amlodipine  Colon cancer screening Due for colonoscopy for colon cancer screening.  - GI referral placed  Diabetes mellitus with multiple complications (HCC) Currently on ozempic  2 mg and jardiance . A1c 7.2 well controlled given her age. Due for kidney function monitoring with diabetes soon.   - BMP      Areta Saliva, MD [facility] "

## 2024-12-12 LAB — BASIC METABOLIC PANEL WITH GFR
BUN/Creatinine Ratio: 22 (ref 12–28)
BUN: 38 mg/dL — ABNORMAL HIGH (ref 8–27)
CO2: 21 mmol/L (ref 20–29)
Calcium: 10 mg/dL (ref 8.7–10.3)
Chloride: 106 mmol/L (ref 96–106)
Creatinine, Ser: 1.69 mg/dL — ABNORMAL HIGH (ref 0.57–1.00)
Glucose: 120 mg/dL — ABNORMAL HIGH (ref 70–99)
Potassium: 5.2 mmol/L (ref 3.5–5.2)
Sodium: 143 mmol/L (ref 134–144)
eGFR: 31 mL/min/1.73 — ABNORMAL LOW

## 2024-12-12 NOTE — Assessment & Plan Note (Signed)
 Currently controlled with amlodipine , losartan , spironolactone .  - BMP  - Refill amlodipine 

## 2024-12-12 NOTE — Assessment & Plan Note (Signed)
 Currently on ozempic  2 mg and jardiance . A1c 7.2 well controlled given her age. Due for kidney function monitoring with diabetes soon.   - BMP

## 2024-12-13 ENCOUNTER — Ambulatory Visit: Payer: Self-pay | Admitting: Family Medicine

## 2024-12-13 ENCOUNTER — Telehealth: Payer: Self-pay

## 2024-12-13 NOTE — Telephone Encounter (Signed)
-----   Message from [facility] Reena S sent at 12/13/2024  9:22 AM EST ----- Walterine Erie!  Ok to schedule STAT CT at a [facility] facility.  Thanks! Margit

## 2024-12-13 NOTE — Telephone Encounter (Signed)
 Attempted to reach patient to see when would a good time to make CT scan appt. LVM for patient to call the office to let me know so I can schedule. Nelson Land, CMA

## 2024-12-13 NOTE — Telephone Encounter (Signed)
 Patient returned call to nurse line via voicemail.   Called patient back,however, she did not answer.   LVM asking her to call back and provide with time preferences for scheduling.   Chiquita JAYSON English, RN

## 2024-12-19 ENCOUNTER — Ambulatory Visit: Payer: Self-pay | Admitting: Family Medicine

## 2024-12-19 ENCOUNTER — Encounter: Payer: Self-pay | Admitting: Family Medicine

## 2024-12-19 VITALS — BP 137/83 | HR 79 | Ht 62.0 in | Wt 205.0 lb

## 2024-12-19 DIAGNOSIS — E1121 Type 2 diabetes mellitus with diabetic nephropathy: Secondary | ICD-10-CM | POA: Diagnosis not present

## 2024-12-19 DIAGNOSIS — N179 Acute kidney failure, unspecified: Secondary | ICD-10-CM | POA: Diagnosis not present

## 2024-12-19 DIAGNOSIS — R519 Headache, unspecified: Secondary | ICD-10-CM | POA: Diagnosis not present

## 2024-12-19 NOTE — Progress Notes (Unsigned)
" ° °  SUBJECTIVE:   CHIEF COMPLAINT / HPI:  Discussed the use of AI scribe software for clinical note transcription with the patient, who gave verbal consent to proceed.  History of Present Illness [name] is a 74 year old female who presents with persistent headaches following a recent fall.  Cephalgia following fall - Persistent headaches since recent fall - Head CT was ordered stat but rescheduled due to transportation issues - No sudden weakness, numbness, or other new neurological changes - Treats headaches with Tylenol  - Avoids ibuprofen  and Goody powders to protect renal function  Renal function - Recent increase in creatinine by 0.3 when checking BMP at last visit - Maintains good oral hydration - Has not been taking any NSAIDs - Currently being treated for a bladder infection by urology. - Does not have any urology notes in the chart recently - Does not know or has not brought the antibiotic that she is being treated for - Has been urinating slightly less though is still urinating - Otherwise no urinary symptoms    PERTINENT  PMH / PSH: HTN, CKD, T2DM   OBJECTIVE:  BP 137/83   Pulse 79   Ht 5' 2 (1.575 m)   Wt 205 lb (93 kg)   SpO2 98%   BMI 37.49 kg/m   General: well appearing, in no acute distress CV: RRR, radial pulses equal and palpable Resp: Normal work of breathing on room air Neuro: Alert & Oriented x 4, PERRLA, EOMI, CN II through XII grossly intact, upper and lower reflexes 1+ symmetric bilaterally, upper and lower extremities 5 out of 5, sensation intact bilaterally  ASSESSMENT/PLAN:   Assessment & Plan AKI (acute kidney injury) Diabetic nephropathy associated with type 2 diabetes mellitus (HCC) Recent creatinine increased by 0.3, possibly due to dehydration or with recent UTI.  Could also be new increased baseline with CKD. - Repeat BMP - Avoid nephrotoxin - Follow-up with urologist on January 27. New onset of headaches Persistent headaches  post-fall with concern for possible intracranial bleed. Neurological examination normal. - Ordered CT scan to rule out intracranial bleed. -Gave patient ED precautions for altered mental status, new neurologic changes. -Follow-up in 2 weeks     Areta Saliva, MD [facility] "

## 2024-12-19 NOTE — Patient Instructions (Addendum)
 It was wonderful to see you today.  Please bring ALL of your medications with you to every visit.    VISIT SUMMARY: During your visit, we discussed your persistent headaches following a recent fall, your kidney function, and your recurrent urinary tract infections. We reviewed your symptoms and current treatments, and made plans for further evaluation and follow-up care.  YOUR PLAN: -HEADACHE FOLLOWING RECENT FALL: You have been experiencing persistent headaches since your recent fall. We are concerned about the possibility of an intracranial bleed, which is bleeding inside the skull. To rule this out, we have ordered a CT scan. Your neurological examination was normal, which is a good sign.  -ACUTE KIDNEY INJURY: Your recent blood tests showed an increase in your creatinine levels, which may indicate an acute kidney injury. This can be caused by dehydration or could be your new baseline. We rechecked your kidney function today and advised you to stay well-hydrated. It could also have been caused by your urinary tract infection.   -URINARY TRACT INFECTION: You have been experiencing recurrent urinary tract infections (UTIs), which are infections in any part of your urinary system. Your symptoms have partially improved with antibiotics. You will follow up with your urologist on January 27th to ensure your treatment is effective.  INSTRUCTIONS: Please complete the CT scan as soon as possible to rule out any serious issues related to your headaches. Continue to stay hydrated to support your kidney function. Follow up with your urologist on January 27th for your recurrent urinary tract infections. If you experience any new or worsening symptoms, please contact our office immediately. If you have worsening headache, develop weakness, sudden loss of sensation, or other neurologic symptoms I recommend you go to the emergency room.   Contains text generated by [employer].   Colonoscopy referral sent to   [facility] [address] [place] [address], [place], [place] [address] Phone: [phone]  Thank you for choosing [facility].   Please call [phone] with any questions about today's appointment.  Please be sure to schedule follow up at the front desk before you leave today.   Areta Saliva, MD  Family Medicine

## 2024-12-20 ENCOUNTER — Ambulatory Visit (HOSPITAL_COMMUNITY)

## 2024-12-20 LAB — BASIC METABOLIC PANEL WITH GFR
BUN/Creatinine Ratio: 21 (ref 12–28)
BUN: 38 mg/dL — ABNORMAL HIGH (ref 8–27)
CO2: 19 mmol/L — ABNORMAL LOW (ref 20–29)
Calcium: 10 mg/dL (ref 8.7–10.3)
Chloride: 103 mmol/L (ref 96–106)
Creatinine, Ser: 1.81 mg/dL — ABNORMAL HIGH (ref 0.57–1.00)
Glucose: 161 mg/dL — ABNORMAL HIGH (ref 70–99)
Potassium: 5.4 mmol/L — ABNORMAL HIGH (ref 3.5–5.2)
Sodium: 140 mmol/L (ref 134–144)
eGFR: 29 mL/min/1.73 — ABNORMAL LOW

## 2024-12-20 NOTE — Assessment & Plan Note (Signed)
 Recent creatinine increased by 0.3, possibly due to dehydration or with recent UTI.  Could also be new increased baseline with CKD. - Repeat BMP - Avoid nephrotoxin - Follow-up with urologist on January 27.

## 2024-12-21 ENCOUNTER — Emergency Department (HOSPITAL_BASED_OUTPATIENT_CLINIC_OR_DEPARTMENT_OTHER)
Admission: EM | Admit: 2024-12-21 | Discharge: 2024-12-21 | Disposition: A | Discharge status: Home / Self Care | Source: Ambulatory Visit | Attending: Emergency Medicine | Admitting: Emergency Medicine

## 2024-12-21 ENCOUNTER — Emergency Department (HOSPITAL_BASED_OUTPATIENT_CLINIC_OR_DEPARTMENT_OTHER)

## 2024-12-21 ENCOUNTER — Encounter (HOSPITAL_BASED_OUTPATIENT_CLINIC_OR_DEPARTMENT_OTHER): Payer: Self-pay | Admitting: Emergency Medicine

## 2024-12-21 ENCOUNTER — Ambulatory Visit: Payer: Self-pay | Admitting: Family Medicine

## 2024-12-21 ENCOUNTER — Other Ambulatory Visit: Payer: Self-pay

## 2024-12-21 DIAGNOSIS — Z7984 Long term (current) use of oral hypoglycemic drugs: Secondary | ICD-10-CM | POA: Diagnosis not present

## 2024-12-21 DIAGNOSIS — Z79899 Other long term (current) drug therapy: Secondary | ICD-10-CM | POA: Diagnosis not present

## 2024-12-21 DIAGNOSIS — W19XXXA Unspecified fall, initial encounter: Secondary | ICD-10-CM | POA: Insufficient documentation

## 2024-12-21 DIAGNOSIS — Z794 Long term (current) use of insulin: Secondary | ICD-10-CM | POA: Diagnosis not present

## 2024-12-21 DIAGNOSIS — I10 Essential (primary) hypertension: Secondary | ICD-10-CM | POA: Insufficient documentation

## 2024-12-21 DIAGNOSIS — Y92019 Unspecified place in single-family (private) house as the place of occurrence of the external cause: Secondary | ICD-10-CM | POA: Diagnosis not present

## 2024-12-21 DIAGNOSIS — R519 Headache, unspecified: Secondary | ICD-10-CM | POA: Insufficient documentation

## 2024-12-21 DIAGNOSIS — N179 Acute kidney failure, unspecified: Secondary | ICD-10-CM | POA: Insufficient documentation

## 2024-12-21 DIAGNOSIS — Z7982 Long term (current) use of aspirin: Secondary | ICD-10-CM | POA: Insufficient documentation

## 2024-12-21 DIAGNOSIS — E119 Type 2 diabetes mellitus without complications: Secondary | ICD-10-CM | POA: Diagnosis not present

## 2024-12-21 LAB — BASIC METABOLIC PANEL WITH GFR
Anion gap: 15 (ref 5–15)
BUN: 44 mg/dL — ABNORMAL HIGH (ref 8–23)
CO2: 19 mmol/L — ABNORMAL LOW (ref 22–32)
Calcium: 10.2 mg/dL (ref 8.9–10.3)
Chloride: 104 mmol/L (ref 98–111)
Creatinine, Ser: 1.76 mg/dL — ABNORMAL HIGH (ref 0.44–1.00)
GFR, Estimated: 30 mL/min — ABNORMAL LOW
Glucose, Bld: 149 mg/dL — ABNORMAL HIGH (ref 70–99)
Potassium: 4.9 mmol/L (ref 3.5–5.1)
Sodium: 138 mmol/L (ref 135–145)

## 2024-12-21 MED ORDER — SODIUM CHLORIDE 0.9 % IV BOLUS
1000.0000 mL | Freq: Once | INTRAVENOUS | Status: AC
  Administered 2024-12-21: 1000 mL via INTRAVENOUS

## 2024-12-21 MED ORDER — ACETAMINOPHEN 500 MG PO TABS
1000.0000 mg | ORAL_TABLET | Freq: Once | ORAL | Status: AC
  Administered 2024-12-21: 1000 mg via ORAL
  Filled 2024-12-21: qty 2

## 2024-12-21 NOTE — Telephone Encounter (Signed)
 Attempted to call patient x 3 regarding worsening renal function and hyperkalemia. Patient did not answer the phone. I called her emergency contact her son and explained her worsening renal function and hyperkalemia and our concern that she would need to be seen in the emergency department given her decreasing urine output.  Additionally patient has been having headaches after a fall and has not been able to get her CT head stat scan.  Her son is a naval architect and will not be home until later today and requested I call his sister who is listed.  She lives with her mother.  I called the sister and left a HIPAA compliant voicemail.  She did not answer her phone after calling twice.  The son said that he would let them know of the concerns when he goes home today.  If patient calls back please inform them to go to the emergency department for worsening renal function and hyperkalemia.  If she goes to the emergency department hopefully will also be able to get CT head scan given persistent headaches after fall.   Areta Saliva, MD

## 2024-12-21 NOTE — Telephone Encounter (Signed)
 Patients daughter returns call to nurse line.   Discussed in detail the need to go to the emergency department.   Discussed labs and worsening kidney function.   Daughter reports she will get her there today.   All questions answered.

## 2024-12-21 NOTE — ED Triage Notes (Signed)
 Seen at provider Labs k= 5.4 told to get checked out Headache after falling 2 weeks ago PCP suggests CT

## 2024-12-21 NOTE — Discharge Instructions (Addendum)
 It was a pleasure taking care of you today. You were seen in the Emergency Department for evaluation of headache and abnormal labs. Your work-up was reassuring. Your CT/Xray/Labs showed no acute intracranial abnormality or evidence of bleeding or fracture in your brain.  Your potassium was normal today.  Your creatinine, which is responsible for kidney function was mildly elevated, likely due to dehydration.  I did give you IV fluids while in the emergency department for this.  I do recommend that you follow-up with your PCP in approximately 1 week for a lab recheck.  If you continue to experience a headache, you may take up to 1000 mg of Tylenol  every 6 hours as needed.  Please refrain from taking anti-inflammatories such as ibuprofen , Aleve  or Advil .  Refer to the attached documentation for further management of your symptoms.   Please return to the ER if you experience chest pain, trouble breathing, intractable nausea/vomiting or any other life threatening illnesses.

## 2024-12-21 NOTE — ED Provider Notes (Signed)
 " Home EMERGENCY DEPARTMENT AT [facility] Provider Note   CSN: [id number] Arrival date & time: 12/21/24  1431     Patient presents with: Headache and Abnormal Lab   [name] is a 74 y.o. female with past medical history of diabetes, hyperlipidemia, hypertension, osteoarthritis, who presents emergency department for evaluation of a headache and abnormal labs.  Patient states she was seen by her PCP 2 days ago, who informed her her potassium was mildly elevated and that she should seek further workup at the ED.  Additionally, patient reports she had a fall at home approximately 2 weeks ago, and is reporting a bilateral aching headache.  She did hit her head at the time of this fall, but is not on any blood thinners.  She did not seek immediate medical care at the time of the incident.  She currently denies any chest pain, shortness of breath, abdominal pain, nausea, vomiting, urinary symptoms.  No dizziness or LOC.   Headache Abnormal Lab      Prior to Admission medications  Medication Sig Start Date End Date Taking? Authorizing Provider  Accu-Chek Softclix Lancets lancets Please use to check blood sugar levels once daily. 07/26/24   Nicholas Bar, MD  acetaminophen  (TYLENOL ) 650 MG CR tablet Take 650-1,300 mg by mouth See admin instructions. Take 1300 mg by mouth in the morning, 650 mg in the afternoon and 1300 mg at night    [provider]  allopurinol  (ZYLOPRIM ) 100 MG tablet TAKE 1 TABLET BY MOUTH DAILY 07/09/24   Nicholas Bar, MD  amLODipine  (NORVASC ) 5 MG tablet Take 1 tablet (5 mg total) by mouth daily. 12/11/24   Nicholas Bar, MD  aspirin  EC 325 MG tablet Take 1 tablet (325 mg total) by mouth daily. 08/08/23   Genelle Standing, MD  atorvastatin  (LIPITOR) 40 MG tablet Take 1 tablet (40 mg total) by mouth daily. 12/06/24   Nicholas Bar, MD  blood glucose meter kit and supplies Dispense based on patient and insurance preference.Use to check blood sugar in  the morning. (FOR ICD-10 E10.9, E11.9).  Onetouch Verio is what she has been using. 11/24/21   Delores Suzann HERO, MD  Blood Glucose Monitoring Suppl (ACCU-CHEK GUIDE) w/Device KIT Please use to check blood sugar levels once daily. 07/26/24   Nicholas Bar, MD  BREO ELLIPTA  200-25 MCG/ACT AEPB INHALE 1 PUFF INTO THE LUNGS AT BEDTIME. 10/23/24   Nicholas Bar, MD  cholecalciferol (VITAMIN D3) 25 MCG (1000 UNIT) tablet Take 2,000 Units by mouth daily.    [provider]  colchicine  0.6 MG tablet TAKE 1 TABLET BY MOUTH DAILY 06/11/24   Nicholas Bar, MD  dorzolamide-timolol (COSOPT) 2-0.5 % ophthalmic solution Place 1 drop into both eyes 2 (two) times daily. 06/26/24   [provider]  EASY COMFORT PEN NEEDLES 31G X 8 MM MISC USE TO INJECT INSULIN  EVERY DAY AS DIRECTED 06/01/24   Nicholas Bar, MD  gabapentin  (NEURONTIN ) 100 MG capsule TAKE 1 CAPSULE BY MOUTH AT  BEDTIME 07/09/24   Nicholas Bar, MD  glucose blood (ACCU-CHEK GUIDE TEST) test strip Please use to check blood sugar levels once daily. 07/26/24   Nicholas Bar, MD  JARDIANCE  25 MG TABS tablet TAKE 1 TABLET BY MOUTH DAILY 07/09/24   Nicholas Bar, MD  Lancet Devices [facility] PLUS LANCING) MISC USE ONCE DAILY 05/21/22   Jeanelle Layman CROME, MD  LANTUS  SOLOSTAR 100 UNIT/ML Solostar Pen INJECT SUBCUTANEOUSLY 32 UNITS  EVERY MORNING 04/30/24   Sowell,  Penne, MD  latanoprost (XALATAN) 0.005 % ophthalmic solution Place 1 drop into both eyes at bedtime.  07/27/17   [provider]  losartan  (COZAAR ) 100 MG tablet TAKE 1 TABLET BY MOUTH AT  BEDTIME 05/22/24   Nicholas Bar, MD  metoprolol  succinate (TOPROL -XL) 25 MG 24 hr tablet Take 0.5 tablets (12.5 mg total) by mouth daily. 03/01/24   Delores Suzann HERO, MD  Semaglutide , 2 MG/DOSE, (OZEMPIC , 2 MG/DOSE,) 8 MG/3ML SOPN Inject 2 mg into the skin once a week. 11/25/23   McDiarmid, Krystal BIRCH, MD  spironolactone  (ALDACTONE ) 25 MG tablet TAKE 1 TABLET BY MOUTH DAILY 07/09/24    Nicholas Bar, MD  trolamine salicylate (ASPERCREME) 10 % cream Apply 1 Application topically as needed for muscle pain.    [provider]    Allergies: Patient has no known allergies.    Review of Systems  Neurological:  Positive for headaches.    Updated Vital Signs BP 133/88 (BP Location: Left Arm)   Pulse 83   Temp 98.7 F (37.1 C)   Resp 18   SpO2 99%   Physical Exam Vitals and nursing note reviewed.  Constitutional:      Appearance: Normal appearance.  HENT:     Head: Normocephalic and atraumatic.     Mouth/Throat:     Mouth: Mucous membranes are moist.  Eyes:     General: No scleral icterus.       Right eye: No discharge.        Left eye: No discharge.     Conjunctiva/sclera: Conjunctivae normal.  Cardiovascular:     Rate and Rhythm: Normal rate and regular rhythm.     Pulses: Normal pulses.  Pulmonary:     Effort: Pulmonary effort is normal.     Breath sounds: Normal breath sounds.  Abdominal:     General: There is no distension.     Tenderness: There is no abdominal tenderness.  Musculoskeletal:        General: No deformity.     Cervical back: Normal range of motion.  Skin:    General: Skin is warm and dry.     Capillary Refill: Capillary refill takes less than 2 seconds.  Neurological:     Mental Status: She is alert.     Motor: No weakness.     Comments: Patient speaks in full goal oriented sentences. Cranial nerves 3-12 grossly intact. DTRs normal and symmetric. Equal grip strength bilateral with 5/5 strength against resistance in upper and lower extremities. No sensory or motor deficits appreciated.   Psychiatric:        Mood and Affect: Mood normal.     (all labs ordered are listed, but only abnormal results are displayed) Labs Reviewed  BASIC METABOLIC PANEL WITH GFR - Abnormal; Notable for the following components:      Result Value   CO2 19 (*)    Glucose, Bld 149 (*)    BUN 44 (*)    Creatinine, Ser 1.76 (*)    GFR,  Estimated 30 (*)    All other components within normal limits    EKG: None  Radiology: CT Head Wo Contrast Result Date: 12/21/2024 EXAM: CT HEAD WITHOUT CONTRAST 12/21/2024 03:17:32 PM TECHNIQUE: CT of the head was performed without the administration of intravenous contrast. Automated exposure control, iterative reconstruction, and/or weight based adjustment of the mA/kV was utilized to reduce the radiation dose to as low as reasonably achievable. COMPARISON: MRI head 01/13/2018. CLINICAL HISTORY: Head trauma, minor (Age >=  65 years). FINDINGS: BRAIN AND VENTRICLES: No acute hemorrhage. No evidence of acute infarct. No hydrocephalus. No extra-axial collection. No mass effect or midline shift. Mild periventricular chronic small vessel ischemia. Atherosclerosis of skullbase vasculature without hyperdense vessel or abnormal calcification. ORBITS: No acute abnormality. Bilateral cataract resection noted. SINUSES: No acute abnormality. SOFT TISSUES AND SKULL: No acute soft tissue abnormality. No skull fracture. IMPRESSION: 1. No acute intracranial abnormality. Electronically signed by: Donnice Mania MD 12/21/2024 03:31 PM EST RP Workstation: [id number]    Procedures   Medications Ordered in the ED  sodium chloride  0.9 % bolus 1,000 mL (1,000 mLs Intravenous New Bag/Given 12/21/24 1623)  acetaminophen  (TYLENOL ) tablet 1,000 mg (1,000 mg Oral Given 12/21/24 1630)                                Medical Decision Making Amount and/or Complexity of Data Reviewed Labs: ordered. Radiology: ordered.   This patient is a 74 y.o. female who presents to the ED for concern of headache and abnormal labs, this involves an extensive number of treatment options, and is a complaint that carries with it a high risk of complications and morbidity. The emergent differential diagnosis prior to evaluation includes, but is not limited to, stroke, TIA, skull fracture, epidural bleeding, subdural bleed, subarachnoid  hemorrhage, migraine, electrolyte abnormality.. This is not an exhaustive differential.   Past Medical History / Co-morbidities / Social History: See above  Additional history: Chart reviewed. Pertinent results include: Patient seen by her PCP 2 days ago and had a mildly elevated potassium of 5.4  Physical Exam: Physical exam performed. The pertinent findings include:   - Elevated creatinine 1.76, baseline around 1.3  Lab Tests: I ordered, and personally interpreted labs.  The pertinent results include: No acute abnormality   Imaging Studies: I ordered imaging studies including CT head. I independently visualized and interpreted imaging which showed no acute intracranial abnormality. I agree with the radiologist interpretation.   Cardiac Monitoring:  The patient was maintained on a cardiac monitor.  My attending physician Dr. Jackquline viewed and interpreted the cardiac monitored which showed an underlying rhythm of: Sinus. I agree with this interpretation.   Medications: I ordered medication including Tylenol  for headache and IV fluids for AKI. Reevaluation of the patient after these medicines showed that the patient improved. I have reviewed the patients home medicines and have made adjustments as needed.   Disposition: After consideration of the diagnostic results and the patients response to treatment, I feel that the patient would benefit from supportive care and a lab recheck in approximately 1 week.  I did treat her AKI with IV fluids and her potassium was within normal limits today on evaluation.  She is currently without additional complaint.   Her emergency department workup does not suggest an emergent condition requiring admission or immediate intervention beyond what has been performed at this time.  The patient is safe for discharge and has been instructed to return immediately for worsening symptoms, change in symptoms or any other concerns.  I discussed this case with my  attending physician Dr. Jackquline who cosigned this note including patient's presenting symptoms, physical exam, and planned diagnostics and interventions. Attending physician stated agreement with plan or made changes to plan which were implemented.     Final diagnoses:  AKI (acute kidney injury)    ED Discharge Orders     None  Torrence Marry RAMAN, PA-C 12/21/24 1657    Simon Lavonia SAILOR, MD 12/21/24 2044  "

## 2024-12-26 ENCOUNTER — Ambulatory Visit (HOSPITAL_COMMUNITY)

## 2024-12-28 ENCOUNTER — Telehealth: Payer: Self-pay

## 2024-12-28 NOTE — Telephone Encounter (Signed)
 Patient calls nurse line regarding concerns with elevated blood sugar levels.   She reports fasting blood sugar this AM was 251. Endorses associated dizziness and nausea.   She administered her Lantus  30 units and Ozempic  this morning and took her jardiance .  She has eaten a slice of bread, bacon. She has been drinking water.   She does not take mealtime insulin .   Down to 174 now. States that her symptoms have improved after taking her insulin . She denies sick symptoms, recent sick contacts.   Denies recent steroids, however, reports that she was recently on abx for kidney infection.   Advised that I would forward message to PCP and Dr. Koval.   Encouraged patient to continue to drink plenty of water. Discussed ED precautions with patient.   Chiquita JAYSON English, RN

## 2025-01-08 ENCOUNTER — Ambulatory Visit: Payer: Self-pay | Admitting: Family Medicine

## 2025-01-14 ENCOUNTER — Ambulatory Visit: Admitting: Physical Therapy

## 2025-01-22 ENCOUNTER — Inpatient Hospital Stay

## 2025-03-07 ENCOUNTER — Ambulatory Visit: Admitting: Podiatry
